# Patient Record
Sex: Male | Born: 1940
Health system: Southern US, Community
[De-identification: ages and names within clinical notes are randomized; demographics above are authoritative.]

## PROBLEM LIST (undated history)

## (undated) DIAGNOSIS — E7800 Pure hypercholesterolemia, unspecified: Secondary | ICD-10-CM

## (undated) DIAGNOSIS — Z8719 Personal history of other diseases of the digestive system: Secondary | ICD-10-CM

## (undated) DIAGNOSIS — M810 Age-related osteoporosis without current pathological fracture: Secondary | ICD-10-CM

## (undated) DIAGNOSIS — I6529 Occlusion and stenosis of unspecified carotid artery: Secondary | ICD-10-CM

## (undated) DIAGNOSIS — C3491 Malignant neoplasm of unspecified part of right bronchus or lung: Secondary | ICD-10-CM

## (undated) DIAGNOSIS — I701 Atherosclerosis of renal artery: Secondary | ICD-10-CM

## (undated) DIAGNOSIS — N189 Chronic kidney disease, unspecified: Secondary | ICD-10-CM

## (undated) DIAGNOSIS — I219 Acute myocardial infarction, unspecified: Secondary | ICD-10-CM

## (undated) DIAGNOSIS — I2510 Atherosclerotic heart disease of native coronary artery without angina pectoris: Secondary | ICD-10-CM

## (undated) DIAGNOSIS — I10 Essential (primary) hypertension: Secondary | ICD-10-CM

## (undated) DIAGNOSIS — Z87442 Personal history of urinary calculi: Secondary | ICD-10-CM

## (undated) DIAGNOSIS — R51 Headache: Secondary | ICD-10-CM

## (undated) DIAGNOSIS — R1319 Other dysphagia: Secondary | ICD-10-CM

## (undated) DIAGNOSIS — E860 Dehydration: Secondary | ICD-10-CM

## (undated) DIAGNOSIS — E039 Hypothyroidism, unspecified: Secondary | ICD-10-CM

## (undated) DIAGNOSIS — IMO0002 Reserved for concepts with insufficient information to code with codable children: Secondary | ICD-10-CM

## (undated) DIAGNOSIS — K219 Gastro-esophageal reflux disease without esophagitis: Secondary | ICD-10-CM

## (undated) DIAGNOSIS — M259 Joint disorder, unspecified: Secondary | ICD-10-CM

## (undated) DIAGNOSIS — J449 Chronic obstructive pulmonary disease, unspecified: Secondary | ICD-10-CM

## (undated) DIAGNOSIS — I739 Peripheral vascular disease, unspecified: Secondary | ICD-10-CM

## (undated) DIAGNOSIS — M25469 Effusion, unspecified knee: Secondary | ICD-10-CM

## (undated) DIAGNOSIS — M1710 Unilateral primary osteoarthritis, unspecified knee: Secondary | ICD-10-CM

## (undated) DIAGNOSIS — I4891 Unspecified atrial fibrillation: Secondary | ICD-10-CM

## (undated) DIAGNOSIS — J4489 Other specified chronic obstructive pulmonary disease: Secondary | ICD-10-CM

## (undated) DIAGNOSIS — Z5111 Encounter for antineoplastic chemotherapy: Secondary | ICD-10-CM

## (undated) DIAGNOSIS — K5730 Diverticulosis of large intestine without perforation or abscess without bleeding: Secondary | ICD-10-CM

## (undated) HISTORY — DX: Atherosclerosis of renal artery: I70.1

## (undated) HISTORY — DX: Unspecified atrial fibrillation: I48.91

## (undated) HISTORY — PX: CARDIAC CATHETERIZATION: SHX172

## (undated) HISTORY — DX: Peripheral vascular disease, unspecified: I73.9

## (undated) HISTORY — DX: Unilateral primary osteoarthritis, unspecified knee: M17.10

## (undated) HISTORY — DX: Hypothyroidism, unspecified: E03.9

## (undated) HISTORY — DX: Pure hypercholesterolemia, unspecified: E78.00

## (undated) HISTORY — PX: KNEE SURGERY: SHX244

## (undated) HISTORY — PX: VASCULAR SURGERY: SHX849

## (undated) HISTORY — DX: Other specified chronic obstructive pulmonary disease: J44.89

## (undated) HISTORY — DX: Encounter for antineoplastic chemotherapy: Z51.11

## (undated) HISTORY — DX: Acute myocardial infarction, unspecified: I21.9

## (undated) HISTORY — DX: Gastro-esophageal reflux disease without esophagitis: K21.9

## (undated) HISTORY — DX: Occlusion and stenosis of unspecified carotid artery: I65.29

## (undated) HISTORY — DX: Age-related osteoporosis without current pathological fracture: M81.0

## (undated) HISTORY — PX: CORONARY ARTERY BYPASS GRAFT: SHX141

## (undated) HISTORY — DX: Other dysphagia: R13.19

## (undated) HISTORY — DX: Dehydration: E86.0

## (undated) HISTORY — PX: JOINT REPLACEMENT: SHX530

## (undated) HISTORY — DX: Chronic kidney disease, unspecified: N18.9

## (undated) HISTORY — PX: CORONARY STENT PLACEMENT: SHX1402

## (undated) HISTORY — DX: Chronic obstructive pulmonary disease, unspecified: J44.9

## (undated) HISTORY — PX: COLONOSCOPY W/ BIOPSIES AND POLYPECTOMY: SHX1376

## (undated) HISTORY — DX: Effusion, unspecified knee: M25.469

## (undated) HISTORY — DX: Atherosclerotic heart disease of native coronary artery without angina pectoris: I25.10

## (undated) HISTORY — DX: Personal history of urinary calculi: Z87.442

## (undated) HISTORY — DX: Reserved for concepts with insufficient information to code with codable children: IMO0002

## (undated) HISTORY — PX: OTHER SURGICAL HISTORY: SHX169

## (undated) HISTORY — DX: Essential (primary) hypertension: I10

## (undated) HISTORY — DX: Diverticulosis of large intestine without perforation or abscess without bleeding: K57.30

## (undated) HISTORY — PX: ABDOMINAL AORTIC ANEURYSM REPAIR: SUR1152

## (undated) HISTORY — DX: Malignant neoplasm of unspecified part of right bronchus or lung: C34.91

---

## 1971-06-21 HISTORY — PX: VASECTOMY: SHX75

## 1998-06-20 HISTORY — PX: CHOLECYSTECTOMY: SHX55

## 1999-09-22 ENCOUNTER — Ambulatory Visit (HOSPITAL_COMMUNITY): Admission: RE | Admit: 1999-09-22 | Discharge: 1999-09-22 | Payer: Self-pay | Admitting: Cardiovascular Disease

## 1999-09-28 ENCOUNTER — Ambulatory Visit (HOSPITAL_COMMUNITY): Admission: RE | Admit: 1999-09-28 | Discharge: 1999-09-28 | Payer: Self-pay | Admitting: Cardiovascular Disease

## 2001-02-04 ENCOUNTER — Emergency Department (HOSPITAL_COMMUNITY): Admission: EM | Admit: 2001-02-04 | Discharge: 2001-02-04 | Payer: Self-pay | Admitting: Emergency Medicine

## 2001-02-16 ENCOUNTER — Ambulatory Visit (HOSPITAL_COMMUNITY): Admission: RE | Admit: 2001-02-16 | Discharge: 2001-02-16 | Payer: Self-pay | Admitting: Internal Medicine

## 2001-02-16 ENCOUNTER — Encounter: Payer: Self-pay | Admitting: Internal Medicine

## 2001-03-02 ENCOUNTER — Inpatient Hospital Stay (HOSPITAL_COMMUNITY): Admission: EM | Admit: 2001-03-02 | Discharge: 2001-03-05 | Payer: Self-pay | Admitting: Emergency Medicine

## 2001-03-03 ENCOUNTER — Encounter: Payer: Self-pay | Admitting: Cardiovascular Disease

## 2001-04-17 ENCOUNTER — Encounter (HOSPITAL_BASED_OUTPATIENT_CLINIC_OR_DEPARTMENT_OTHER): Payer: Self-pay | Admitting: General Surgery

## 2001-04-20 ENCOUNTER — Encounter (HOSPITAL_BASED_OUTPATIENT_CLINIC_OR_DEPARTMENT_OTHER): Payer: Self-pay | Admitting: General Surgery

## 2001-04-20 ENCOUNTER — Ambulatory Visit (HOSPITAL_COMMUNITY): Admission: RE | Admit: 2001-04-20 | Discharge: 2001-04-21 | Payer: Self-pay | Admitting: General Surgery

## 2001-04-20 ENCOUNTER — Encounter (INDEPENDENT_AMBULATORY_CARE_PROVIDER_SITE_OTHER): Payer: Self-pay | Admitting: *Deleted

## 2001-06-20 ENCOUNTER — Emergency Department (HOSPITAL_COMMUNITY): Admission: EM | Admit: 2001-06-20 | Discharge: 2001-06-20 | Payer: Self-pay

## 2002-03-21 ENCOUNTER — Encounter: Payer: Self-pay | Admitting: Internal Medicine

## 2002-03-21 ENCOUNTER — Ambulatory Visit (HOSPITAL_COMMUNITY): Admission: RE | Admit: 2002-03-21 | Discharge: 2002-03-21 | Payer: Self-pay | Admitting: Internal Medicine

## 2002-03-22 ENCOUNTER — Encounter (HOSPITAL_COMMUNITY): Admission: RE | Admit: 2002-03-22 | Discharge: 2002-04-21 | Payer: Self-pay | Admitting: Cardiology

## 2002-06-20 HISTORY — PX: GALLBLADDER SURGERY: SHX652

## 2002-10-18 ENCOUNTER — Ambulatory Visit (HOSPITAL_COMMUNITY): Admission: RE | Admit: 2002-10-18 | Discharge: 2002-10-18 | Payer: Self-pay | Admitting: Orthopaedic Surgery

## 2002-12-10 ENCOUNTER — Encounter (HOSPITAL_COMMUNITY): Admission: RE | Admit: 2002-12-10 | Discharge: 2003-01-09 | Payer: Self-pay | Admitting: Orthopaedic Surgery

## 2003-06-30 ENCOUNTER — Encounter: Payer: Self-pay | Admitting: Orthopedic Surgery

## 2004-05-03 ENCOUNTER — Ambulatory Visit: Payer: Self-pay | Admitting: Cardiology

## 2004-05-03 ENCOUNTER — Ambulatory Visit: Payer: Self-pay

## 2004-05-07 ENCOUNTER — Ambulatory Visit: Payer: Self-pay | Admitting: Cardiovascular Disease

## 2004-05-24 ENCOUNTER — Encounter (INDEPENDENT_AMBULATORY_CARE_PROVIDER_SITE_OTHER): Payer: Self-pay | Admitting: Specialist

## 2004-05-24 ENCOUNTER — Inpatient Hospital Stay (HOSPITAL_COMMUNITY): Admission: RE | Admit: 2004-05-24 | Discharge: 2004-05-25 | Payer: Self-pay | Admitting: Vascular Surgery

## 2004-07-09 ENCOUNTER — Ambulatory Visit: Payer: Self-pay | Admitting: Cardiovascular Disease

## 2004-07-12 ENCOUNTER — Ambulatory Visit: Payer: Self-pay | Admitting: Internal Medicine

## 2004-07-13 ENCOUNTER — Encounter: Admission: RE | Admit: 2004-07-13 | Discharge: 2004-07-13 | Payer: Self-pay | Admitting: Cardiovascular Disease

## 2004-07-15 ENCOUNTER — Ambulatory Visit: Payer: Self-pay | Admitting: Internal Medicine

## 2004-07-22 ENCOUNTER — Ambulatory Visit: Payer: Self-pay

## 2004-08-17 ENCOUNTER — Ambulatory Visit: Admission: RE | Admit: 2004-08-17 | Discharge: 2004-08-17 | Payer: Self-pay | Admitting: Vascular Surgery

## 2004-08-26 ENCOUNTER — Ambulatory Visit (HOSPITAL_COMMUNITY): Admission: RE | Admit: 2004-08-26 | Discharge: 2004-08-26 | Payer: Self-pay | Admitting: Vascular Surgery

## 2004-11-08 ENCOUNTER — Ambulatory Visit: Payer: Self-pay | Admitting: Cardiology

## 2004-11-11 ENCOUNTER — Ambulatory Visit: Payer: Self-pay | Admitting: Cardiology

## 2004-11-11 ENCOUNTER — Ambulatory Visit: Payer: Self-pay | Admitting: Cardiovascular Disease

## 2005-01-13 ENCOUNTER — Encounter: Payer: Self-pay | Admitting: Orthopedic Surgery

## 2005-03-07 ENCOUNTER — Ambulatory Visit: Payer: Self-pay

## 2005-03-07 ENCOUNTER — Ambulatory Visit: Payer: Self-pay | Admitting: Cardiovascular Disease

## 2005-03-29 ENCOUNTER — Ambulatory Visit: Payer: Self-pay | Admitting: Cardiovascular Disease

## 2005-05-16 ENCOUNTER — Ambulatory Visit: Payer: Self-pay | Admitting: Orthopedic Surgery

## 2005-05-23 ENCOUNTER — Ambulatory Visit: Payer: Self-pay

## 2005-05-26 ENCOUNTER — Ambulatory Visit: Payer: Self-pay | Admitting: Internal Medicine

## 2005-09-28 ENCOUNTER — Ambulatory Visit: Payer: Self-pay | Admitting: Cardiovascular Disease

## 2005-10-10 ENCOUNTER — Ambulatory Visit: Payer: Self-pay

## 2005-11-15 ENCOUNTER — Ambulatory Visit: Payer: Self-pay | Admitting: Cardiovascular Disease

## 2005-11-17 ENCOUNTER — Ambulatory Visit: Payer: Self-pay | Admitting: Cardiology

## 2006-03-07 ENCOUNTER — Ambulatory Visit: Payer: Self-pay | Admitting: Cardiovascular Disease

## 2006-04-06 ENCOUNTER — Ambulatory Visit (HOSPITAL_COMMUNITY): Admission: RE | Admit: 2006-04-06 | Discharge: 2006-04-06 | Payer: Self-pay | Admitting: Vascular Surgery

## 2006-04-27 ENCOUNTER — Ambulatory Visit: Payer: Self-pay

## 2006-05-04 ENCOUNTER — Ambulatory Visit: Payer: Self-pay | Admitting: Cardiology

## 2006-06-21 LAB — HM COLONOSCOPY: HM Colonoscopy: NORMAL

## 2006-08-23 ENCOUNTER — Ambulatory Visit: Payer: Self-pay | Admitting: Cardiovascular Disease

## 2006-08-23 LAB — CONVERTED CEMR LAB
BUN: 19 mg/dL (ref 6–23)
Basophils Absolute: 0 10*3/uL (ref 0.0–0.1)
Basophils Relative: 0.6 % (ref 0.0–1.0)
CO2: 30 meq/L (ref 19–32)
Calcium: 9.2 mg/dL (ref 8.4–10.5)
Chloride: 102 meq/L (ref 96–112)
Creatinine, Ser: 0.8 mg/dL (ref 0.4–1.5)
Hemoglobin: 14.1 g/dL (ref 13.0–17.0)
INR: 1 (ref 0.9–2.0)
MCHC: 33.5 g/dL (ref 30.0–36.0)
Monocytes Relative: 6.4 % (ref 3.0–11.0)
Prothrombin Time: 12.1 s (ref 10.0–14.0)
RBC: 5 M/uL (ref 4.22–5.81)
RDW: 14.4 % (ref 11.5–14.6)
aPTT: 30.3 s (ref 26.5–36.5)

## 2006-08-28 ENCOUNTER — Ambulatory Visit (HOSPITAL_COMMUNITY): Admission: RE | Admit: 2006-08-28 | Discharge: 2006-08-28 | Payer: Self-pay | Admitting: Cardiovascular Disease

## 2006-08-28 ENCOUNTER — Ambulatory Visit: Payer: Self-pay | Admitting: Cardiovascular Disease

## 2006-10-13 ENCOUNTER — Ambulatory Visit: Payer: Self-pay | Admitting: Cardiovascular Disease

## 2006-10-13 LAB — CONVERTED CEMR LAB
Alkaline Phosphatase: 82 units/L (ref 39–117)
Bilirubin, Direct: 0.1 mg/dL (ref 0.0–0.3)
Cholesterol: 123 mg/dL (ref 0–200)
HDL: 41.8 mg/dL (ref >39.0)
LDL Cholesterol: 67 mg/dL (ref 0–99)
Total Bilirubin: 0.8 mg/dL (ref 0.3–1.2)
Total CHOL/HDL Ratio: 2.9
Total Protein: 5.9 g/dL — ABNORMAL LOW (ref 6.0–8.3)
Triglycerides: 70 mg/dL (ref 0–149)

## 2006-10-19 ENCOUNTER — Ambulatory Visit: Payer: Self-pay | Admitting: Internal Medicine

## 2007-01-01 ENCOUNTER — Ambulatory Visit: Payer: Self-pay | Admitting: Internal Medicine

## 2007-01-11 ENCOUNTER — Ambulatory Visit: Payer: Self-pay | Admitting: Internal Medicine

## 2007-01-11 ENCOUNTER — Encounter: Payer: Self-pay | Admitting: Internal Medicine

## 2007-02-13 ENCOUNTER — Ambulatory Visit: Payer: Self-pay | Admitting: Vascular Surgery

## 2007-04-02 ENCOUNTER — Ambulatory Visit: Payer: Self-pay | Admitting: Internal Medicine

## 2007-04-02 LAB — CONVERTED CEMR LAB
ALT: 22 units/L (ref 0–53)
AST: 23 units/L (ref 0–37)
Bilirubin, Direct: 0.1 mg/dL (ref 0.0–0.3)
Cholesterol: 135 mg/dL (ref 0–200)
HDL: 38.4 mg/dL — ABNORMAL LOW (ref >39.0)
Total Bilirubin: 0.8 mg/dL (ref 0.3–1.2)
Triglycerides: 142 mg/dL (ref 0–149)
VLDL: 28 mg/dL (ref 0–40)

## 2007-04-05 ENCOUNTER — Ambulatory Visit: Payer: Self-pay | Admitting: Cardiology

## 2007-07-09 ENCOUNTER — Ambulatory Visit: Payer: Self-pay

## 2007-07-09 LAB — CONVERTED CEMR LAB
ALT: 25 units/L (ref 0–53)
Albumin: 3.8 g/dL (ref 3.5–5.2)
Alkaline Phosphatase: 63 units/L (ref 39–117)
LDL Cholesterol: 53 mg/dL (ref 0–99)
Total Bilirubin: 0.8 mg/dL (ref 0.3–1.2)
Total CHOL/HDL Ratio: 3

## 2007-07-12 ENCOUNTER — Ambulatory Visit: Payer: Self-pay | Admitting: Cardiology

## 2007-07-20 ENCOUNTER — Ambulatory Visit: Payer: Self-pay | Admitting: Cardiology

## 2007-07-20 ENCOUNTER — Observation Stay (HOSPITAL_COMMUNITY): Admission: EM | Admit: 2007-07-20 | Discharge: 2007-07-21 | Payer: Self-pay | Admitting: Emergency Medicine

## 2007-08-03 ENCOUNTER — Encounter (HOSPITAL_COMMUNITY): Admission: RE | Admit: 2007-08-03 | Discharge: 2007-09-02 | Payer: Self-pay | Admitting: Cardiology

## 2007-08-03 ENCOUNTER — Ambulatory Visit: Payer: Self-pay | Admitting: Cardiology

## 2007-08-06 ENCOUNTER — Ambulatory Visit: Payer: Self-pay | Admitting: Cardiovascular Disease

## 2007-10-10 DIAGNOSIS — I2510 Atherosclerotic heart disease of native coronary artery without angina pectoris: Secondary | ICD-10-CM

## 2007-10-10 DIAGNOSIS — I10 Essential (primary) hypertension: Secondary | ICD-10-CM | POA: Insufficient documentation

## 2007-10-10 DIAGNOSIS — J441 Chronic obstructive pulmonary disease with (acute) exacerbation: Secondary | ICD-10-CM

## 2007-10-10 DIAGNOSIS — K219 Gastro-esophageal reflux disease without esophagitis: Secondary | ICD-10-CM

## 2007-10-10 DIAGNOSIS — Z87442 Personal history of urinary calculi: Secondary | ICD-10-CM

## 2007-10-10 DIAGNOSIS — Z9889 Other specified postprocedural states: Secondary | ICD-10-CM

## 2007-10-10 DIAGNOSIS — Z9089 Acquired absence of other organs: Secondary | ICD-10-CM | POA: Insufficient documentation

## 2007-10-10 DIAGNOSIS — Z951 Presence of aortocoronary bypass graft: Secondary | ICD-10-CM

## 2007-10-10 DIAGNOSIS — K5730 Diverticulosis of large intestine without perforation or abscess without bleeding: Secondary | ICD-10-CM | POA: Insufficient documentation

## 2007-10-10 DIAGNOSIS — E039 Hypothyroidism, unspecified: Secondary | ICD-10-CM

## 2007-10-10 DIAGNOSIS — I739 Peripheral vascular disease, unspecified: Secondary | ICD-10-CM | POA: Insufficient documentation

## 2007-10-10 DIAGNOSIS — E7800 Pure hypercholesterolemia, unspecified: Secondary | ICD-10-CM

## 2007-10-10 DIAGNOSIS — M810 Age-related osteoporosis without current pathological fracture: Secondary | ICD-10-CM | POA: Insufficient documentation

## 2007-10-26 ENCOUNTER — Ambulatory Visit: Payer: Self-pay | Admitting: Cardiovascular Disease

## 2007-12-11 ENCOUNTER — Ambulatory Visit: Payer: Self-pay | Admitting: Orthopedic Surgery

## 2007-12-11 DIAGNOSIS — M1710 Unilateral primary osteoarthritis, unspecified knee: Secondary | ICD-10-CM

## 2007-12-31 ENCOUNTER — Ambulatory Visit: Payer: Self-pay | Admitting: Cardiovascular Disease

## 2007-12-31 LAB — CONVERTED CEMR LAB
Albumin: 3.7 g/dL (ref 3.5–5.2)
HDL: 40.6 mg/dL (ref >39.0)
LDL Cholesterol: 69 mg/dL (ref 0–99)
Total Bilirubin: 0.8 mg/dL (ref 0.3–1.2)
Total CHOL/HDL Ratio: 3.2
Triglycerides: 96 mg/dL (ref 0–149)

## 2008-01-03 ENCOUNTER — Ambulatory Visit: Payer: Self-pay | Admitting: Cardiovascular Disease

## 2008-01-14 ENCOUNTER — Ambulatory Visit: Payer: Self-pay | Admitting: Orthopedic Surgery

## 2008-01-14 DIAGNOSIS — M25569 Pain in unspecified knee: Secondary | ICD-10-CM

## 2008-01-14 DIAGNOSIS — M25469 Effusion, unspecified knee: Secondary | ICD-10-CM | POA: Insufficient documentation

## 2008-01-28 ENCOUNTER — Ambulatory Visit: Payer: Self-pay | Admitting: Orthopedic Surgery

## 2008-01-29 ENCOUNTER — Ambulatory Visit: Payer: Self-pay | Admitting: Vascular Surgery

## 2008-03-16 ENCOUNTER — Ambulatory Visit: Payer: Self-pay | Admitting: Orthopedic Surgery

## 2008-04-02 ENCOUNTER — Ambulatory Visit: Payer: Self-pay | Admitting: Orthopedic Surgery

## 2008-04-30 ENCOUNTER — Ambulatory Visit: Payer: Self-pay | Admitting: Cardiovascular Disease

## 2008-07-22 ENCOUNTER — Ambulatory Visit: Payer: Self-pay | Admitting: Cardiology

## 2008-07-22 LAB — CONVERTED CEMR LAB
ALT: 25 units/L (ref 0–53)
Albumin: 3.8 g/dL (ref 3.5–5.2)
Cholesterol: 119 mg/dL (ref 0–200)
HDL: 36.6 mg/dL — ABNORMAL LOW (ref >39.0)
Total Protein: 6.3 g/dL (ref 6.0–8.3)
VLDL: 20 mg/dL (ref 0–40)

## 2008-07-28 ENCOUNTER — Ambulatory Visit: Payer: Self-pay | Admitting: Cardiology

## 2008-09-25 ENCOUNTER — Ambulatory Visit: Payer: Self-pay | Admitting: Orthopedic Surgery

## 2008-10-09 ENCOUNTER — Encounter: Payer: Self-pay | Admitting: Cardiovascular Disease

## 2008-10-22 ENCOUNTER — Ambulatory Visit: Payer: Self-pay

## 2008-10-22 ENCOUNTER — Encounter: Payer: Self-pay | Admitting: Cardiovascular Disease

## 2008-10-22 ENCOUNTER — Encounter (INDEPENDENT_AMBULATORY_CARE_PROVIDER_SITE_OTHER): Payer: Self-pay | Admitting: *Deleted

## 2008-10-22 LAB — CONVERTED CEMR LAB
Bilirubin, Direct: 0.3 mg/dL (ref 0.0–0.3)
HDL: 36.7 mg/dL — ABNORMAL LOW (ref >39.00)
LDL Cholesterol: 50 mg/dL (ref 0–99)
Total Bilirubin: 0.9 mg/dL (ref 0.3–1.2)
Total CHOL/HDL Ratio: 3
Triglycerides: 129 mg/dL (ref 0.0–149.0)
VLDL: 25.8 mg/dL (ref 0.0–40.0)

## 2008-10-27 ENCOUNTER — Ambulatory Visit: Payer: Self-pay | Admitting: Cardiovascular Disease

## 2008-11-03 ENCOUNTER — Ambulatory Visit: Payer: Self-pay | Admitting: Internal Medicine

## 2008-11-03 LAB — CONVERTED CEMR LAB
CO2: 29 meq/L (ref 19–32)
Calcium: 9.6 mg/dL (ref 8.4–10.5)
Creatinine, Ser: 0.9 mg/dL (ref 0.4–1.5)
TSH: 0.94 microintl units/mL (ref 0.35–5.50)

## 2008-11-10 ENCOUNTER — Ambulatory Visit: Payer: Self-pay | Admitting: Cardiovascular Disease

## 2008-12-01 ENCOUNTER — Encounter: Payer: Self-pay | Admitting: Cardiovascular Disease

## 2008-12-10 ENCOUNTER — Ambulatory Visit: Payer: Self-pay | Admitting: Cardiovascular Disease

## 2008-12-10 ENCOUNTER — Ambulatory Visit: Payer: Self-pay

## 2008-12-10 DIAGNOSIS — I4891 Unspecified atrial fibrillation: Secondary | ICD-10-CM

## 2009-04-15 ENCOUNTER — Ambulatory Visit: Payer: Self-pay | Admitting: Orthopedic Surgery

## 2009-04-22 ENCOUNTER — Ambulatory Visit: Payer: Self-pay | Admitting: Cardiovascular Disease

## 2009-04-30 ENCOUNTER — Ambulatory Visit: Payer: Self-pay | Admitting: Cardiovascular Disease

## 2009-04-30 LAB — CONVERTED CEMR LAB: HDL goal, serum: 40 mg/dL

## 2009-05-04 ENCOUNTER — Encounter (INDEPENDENT_AMBULATORY_CARE_PROVIDER_SITE_OTHER): Payer: Self-pay | Admitting: *Deleted

## 2009-05-04 LAB — CONVERTED CEMR LAB
Bilirubin, Direct: 0.1 mg/dL (ref 0.0–0.3)
LDL Cholesterol: 60 mg/dL (ref 0–99)
Total Bilirubin: 1.1 mg/dL (ref 0.3–1.2)
Total CHOL/HDL Ratio: 3
VLDL: 21.2 mg/dL (ref 0.0–40.0)

## 2009-09-23 ENCOUNTER — Ambulatory Visit: Payer: Self-pay | Admitting: Cardiology

## 2009-09-23 ENCOUNTER — Ambulatory Visit: Payer: Self-pay | Admitting: Cardiovascular Disease

## 2009-09-23 ENCOUNTER — Encounter: Payer: Self-pay | Admitting: Cardiovascular Disease

## 2009-09-23 ENCOUNTER — Encounter (INDEPENDENT_AMBULATORY_CARE_PROVIDER_SITE_OTHER): Payer: Self-pay | Admitting: *Deleted

## 2009-09-23 ENCOUNTER — Ambulatory Visit: Payer: Self-pay

## 2009-09-23 ENCOUNTER — Ambulatory Visit (HOSPITAL_COMMUNITY): Admission: RE | Admit: 2009-09-23 | Discharge: 2009-09-23 | Payer: Self-pay | Admitting: Cardiovascular Disease

## 2009-10-19 ENCOUNTER — Ambulatory Visit: Payer: Self-pay | Admitting: Cardiology

## 2009-10-22 ENCOUNTER — Ambulatory Visit: Payer: Self-pay | Admitting: Internal Medicine

## 2009-10-27 ENCOUNTER — Encounter (INDEPENDENT_AMBULATORY_CARE_PROVIDER_SITE_OTHER): Payer: Self-pay | Admitting: *Deleted

## 2009-10-27 LAB — CONVERTED CEMR LAB
HDL: 35.8 mg/dL — ABNORMAL LOW (ref >39.00)
LDL Cholesterol: 40 mg/dL (ref 0–99)
Total CHOL/HDL Ratio: 3
Total Protein: 6.1 g/dL (ref 6.0–8.3)
Triglycerides: 100 mg/dL (ref 0.0–149.0)
VLDL: 20 mg/dL (ref 0.0–40.0)

## 2009-10-29 ENCOUNTER — Telehealth: Payer: Self-pay | Admitting: Cardiovascular Disease

## 2010-04-19 ENCOUNTER — Ambulatory Visit: Payer: Self-pay | Admitting: Cardiovascular Disease

## 2010-04-20 ENCOUNTER — Encounter (INDEPENDENT_AMBULATORY_CARE_PROVIDER_SITE_OTHER): Payer: Self-pay | Admitting: *Deleted

## 2010-04-20 LAB — CONVERTED CEMR LAB
ALT: 29 units/L (ref 0–53)
Albumin: 3.8 g/dL (ref 3.5–5.2)
Bilirubin, Direct: 0.1 mg/dL (ref 0.0–0.3)
Cholesterol: 111 mg/dL (ref 0–200)
Total CHOL/HDL Ratio: 3
Total Protein: 6.2 g/dL (ref 6.0–8.3)
Triglycerides: 94 mg/dL (ref 0.0–149.0)

## 2010-04-22 ENCOUNTER — Ambulatory Visit: Payer: Self-pay | Admitting: Cardiology

## 2010-04-27 ENCOUNTER — Ambulatory Visit: Payer: Self-pay | Admitting: Cardiovascular Disease

## 2010-04-27 ENCOUNTER — Encounter: Payer: Self-pay | Admitting: Internal Medicine

## 2010-04-27 ENCOUNTER — Encounter: Payer: Self-pay | Admitting: Cardiovascular Disease

## 2010-04-27 DIAGNOSIS — R1319 Other dysphagia: Secondary | ICD-10-CM

## 2010-06-03 ENCOUNTER — Ambulatory Visit: Payer: Self-pay | Admitting: Cardiovascular Disease

## 2010-06-03 ENCOUNTER — Ambulatory Visit: Payer: Self-pay

## 2010-06-03 ENCOUNTER — Encounter: Payer: Self-pay | Admitting: Cardiovascular Disease

## 2010-07-18 LAB — CONVERTED CEMR LAB
Eosinophils Relative: 1.5 % (ref 0.0–5.0)
INR: 0.9 (ref 0.8–1.0)
MCV: 88.1 fL (ref 78.0–100.0)
Monocytes Absolute: 0.5 10*3/uL (ref 0.1–1.0)
Neutrophils Relative %: 68.6 % (ref 43.0–77.0)
Platelets: 176 10*3/uL (ref 150.0–400.0)
Prothrombin Time: 10.1 s — ABNORMAL LOW (ref 10.9–13.3)
WBC: 6.7 10*3/uL (ref 4.5–10.5)

## 2010-07-22 NOTE — Letter (Signed)
Summary: Custom - Lipid  [facility], Main Office  [address]   [place], [place] [address]   Phone: [phone]  Fax: [phone]     April 20, 2010 MRN: [id number]   [name] [name] [address] [place], [place]  [address]   Dear [name],  We have reviewed your cholesterol results.  They are as follows:     Total Cholesterol:    111 (Desirable: less than 200)       HDL  Cholesterol:     35.50  (Desirable: greater than 40 for men and 50 for women)       LDL Cholesterol:       57  (Desirable: less than 100 for low risk and less than 70 for moderate to high risk)       Triglycerides:       94.0  (Desirable: less than 150)  Our recommendations include:These numbers look good. Continue on the same medicine. Liver function is normal. Take care, Dr. Leanora Cover.   Call our office at the number listed above if you have any questions.  Lowering your LDL cholesterol is important, but it is only one of a large number of "risk factors" that may indicate that you are at risk for heart disease, stroke or other complications of hardening of the arteries.  Other risk factors include:   A.  Cigarette Smoking* B.  High Blood Pressure* C.  Obesity* D.   Low HDL Cholesterol (see yours above)* E.   Diabetes Mellitus (higher risk if your is uncontrolled) F.  Family history of premature heart disease G.  Previous history of stroke or cardiovascular disease    *These are risk factors YOU HAVE CONTROL OVER.  For more information, visit .  There is now evidence that lowering the TOTAL CHOLESTEROL AND LDL CHOLESTEROL can reduce the risk of heart disease.  The American Heart Association recommends the following guidelines for the treatment of elevated cholesterol:  1.  If there is now current heart disease and less than two risk factors, TOTAL CHOLESTEROL should be less than 200 and LDL CHOLESTEROL should be less than 100. 2.  If there is current heart  disease or two or more risk factors, TOTAL CHOLESTEROL should be less than 200 and LDL CHOLESTEROL should be less than 70.  A diet low in cholesterol, saturated fat, and calories is the cornerstone of treatment for elevated cholesterol.  Cessation of smoking and exercise are also important in the management of elevated cholesterol and preventing vascular disease.  Studies have shown that 30 to 60 minutes of physical activity most days can help lower blood pressure, lower cholesterol, and keep your weight at a healthy level.  Drug therapy is used when cholesterol levels do not respond to therapeutic lifestyle changes (smoking cessation, diet, and exercise) and remains unacceptably high.  If medication is started, it is important to have you levels checked periodically to evaluate the need for further treatment options.  Thank you,    [employer] Team

## 2010-07-22 NOTE — Letter (Signed)
Summary: New Patient letter  [facility]  [address] [place], [place] [address]   Phone: [phone]  Fax: [phone]       04/27/2010 MRN: [id number]  [facility] [address] [place], [place]  [address]  Dear Mr. MCENERY,  Welcome to the Gastroenterology Division at [facility].    You are scheduled to see Dr.  Marina Goodell on 06-15-10 at 1:45PM on the 3rd floor at [facility], [address]  We ask that you try to arrive at our office 15 minutes prior to your appointment time to allow for check-in.  We would like you to complete the enclosed self-administered evaluation form prior to your visit and bring it with you on the day of your appointment.  We will review it with you.  Also, please bring a complete list of all your medications or, if you prefer, bring the medication bottles and we will list them.  Please bring your insurance card so that we may make a copy of it.  If your insurance requires a referral to see a specialist, please bring your referral form from your primary care physician.  Co-payments are due at the time of your visit and may be paid by cash, check or credit card.     Your office visit will consist of a consult with your physician (includes a physical exam), any laboratory testing he/she may order, scheduling of any necessary diagnostic testing (e.g. x-ray, ultrasound, CT-scan), and scheduling of a procedure (e.g. Endoscopy, Colonoscopy) if required.  Please allow enough time on your schedule to allow for any/all of these possibilities.    If you cannot keep your appointment, please call [phone] to cancel or reschedule prior to your appointment date.  This allows [place] the opportunity to schedule an appointment for another patient in need of care.  If you do not cancel or reschedule by 5 p.m. the business day prior to your appointment date, you will be charged a $50.00 late cancellation/no-show fee.    Thank you for  choosing [facility] for your medical needs.  We appreciate the opportunity to care for you.  Please visit [place] at our website  to learn more about our practice.                     Sincerely,                                                             The Gastroenterology Division

## 2010-07-22 NOTE — Assessment & Plan Note (Signed)
Summary: rov/sp   Visit Type:  Follow-up   History of Present Illness: [name] has been doing well.  He has been compliant and tolerating his cholesterol-lowering meds: Crestor 20mg , No Flush Niacin 2g, fish oil 2g, flaxseed oil 2g, coenzyme Q10 100mg .  He denies any muscle aches or pains.  He tries to observe a heart healthy diet and walks 1-2 miles most mornings.   Lipid Management Provider  Charolotte Eke, PharmD  Current Medications (verified): 1)  Triamterene-Hctz 37.5-25 Mg Caps (Triamterene-Hctz) .[name].. 1 Tab By Mouth Once Daily 2)  Metoprolol Tartrate 50 Mg Tabs (Metoprolol Tartrate) .[name].. 1 1/2 Tab By Mouth Two Times A Day 3)  Levothroid 125 Mcg Tabs (Levothyroxine Sodium) .... One Tablet Daily 4)  Plavix 75 Mg Tabs (Clopidogrel Bisulfate) .... Take One Tablet By Mouth Daily 5)  Prevacid 15 Mg Cpdr (Lansoprazole) .[name].. 1 Tab By Mouth Once Daily 6)  Crestor 20 Mg Tabs (Rosuvastatin Calcium) .... Take One Tablet By Mouth Daily. 7)  Co-Enzyme Q-10 100 Mg Caps (Coenzyme Q10) .... One Tablet Daily 8)  Aspirin Ec 325 Mg Tbec (Aspirin) .... Take One Tablet By Mouth Daily 9)  Eql Fish Oil 1000 Mg Caps (Omega-3 Fatty Acids) .... 2 Tablets Daily 10)  No Flush Niacin 500 Mg Tabs (Inositol Niacinate) .... 4 Tabs By Mouth At Bedtime 11)  Gnp Flax Seed Oil 1000 Mg Caps (Flaxseed (Linseed)) .... 2 Tablets Daily  Allergies (verified): No Known Drug Allergies   Vital Signs:  Patient profile:   70 year old male Weight:      167 pounds Pulse rate:   56 / minute BP sitting:   160 / 75  (right arm)  Impression & Recommendations:  Problem # 1:  HYPERCHOLESTEROLEMIA (ICD-272.0) His TC, TG, and LDL are at goals and even better than 6 months ago.  His HDL improved from 33.2 to 16.1 but is not quite at the goal of >40.  I congratulated him and asked him to return for follow-up in six months.  His updated medication list for this problem includes:    Crestor 20 Mg Tabs (Rosuvastatin calcium)  .[name]... Take one tablet by mouth daily.    Niacin    Fish oil    Flaxseed oil    Coenzyme Q10

## 2010-07-22 NOTE — Assessment & Plan Note (Signed)
Summary: f35m/jss recall  Medications Added NO FLUSH NIACIN 500 MG TABS (INOSITOL NIACINATE) 4 tabs by mouth at bedtime      Allergies Added: NKDA  CC:  no complaints.  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  he has seen Dr. Hart Rochester for surveillance of his AAA.    He prefers to have his carotids and subclavian followed up here.  His last carotid duplex done here in 09/2008  showed 40-59% left ICA stenosis.  With a patent left subclavian stent.  He has not had any significant TIAs her left upper extremity arm weakness.  Had a previous CABG.  His last catheter was done 310 2008.  His LIMA to the LAD was patent with a patent vein graft to the PDA and no significant  disease in his circumflex.  His LV function was normal. He denies SSCP and has chronic mild exertional dyspnea from lung disease.  I reviewed his echo from today and he has normal LV funciton with no significant valvualr heart disease.    Current Problems (verified): 1)  Atrial Fibrillation  (ICD-427.31) 2)  Encounter For Long-term Use of Other Medications  (ICD-V58.69) 3)  Joint Effusion, Knee  (ICD-719.06) 4)  Knee Pain  (ICD-719.46) 5)  Knee, Arthritis, Degen.Lanetta Inch  (ICD-715.96) 6)  Carotid Endarterectomy, Right, Hx of  (ICD-V15.1) 7)  Abdominal Aortic Aneurysm Repair, Hx of  (ICD-V15.1) 8)  Cholecystectomy, Hx of  (ICD-V45.79) 9)  Coronary Artery Bypass Graft, Hx of  (ICD-V45.81) 10)  Osteoporosis  (ICD-733.00) 11)  Nephrolithiasis, Hx of  (ICD-V13.01) 12)  Diverticulosis of Colon  (ICD-562.10) 13)  Chronic Obstructive Pulmonary Disease  (ICD-496) 14)  Hypertension  (ICD-401.9) 15)  Peripheral Vascular Disease  (ICD-443.9) 16)  Hypothyroidism  (ICD-244.9) 17)  Gerd  (ICD-530.81) 18)  Hypercholesterolemia  (ICD-272.0) 19)  Coronary Artery Disease  (ICD-414.00)  Current Medications (verified): 1)  Triamterene-Hctz 37.5-25 Mg Caps  (Triamterene-Hctz) .[name].. 1 Tab By Mouth Once Daily 2)  Metoprolol Tartrate 50 Mg Tabs (Metoprolol Tartrate) .[name].. 1 1/2 Tab By Mouth Two Times A Day 3)  Levothroid 125 Mcg Tabs (Levothyroxine Sodium) .... One Tablet Daily 4)  Plavix 75 Mg Tabs (Clopidogrel Bisulfate) .... Take One Tablet By Mouth Daily 5)  Prevacid 15 Mg Cpdr (Lansoprazole) .[name].. 1 Tab By Mouth Once Daily 6)  Crestor 20 Mg Tabs (Rosuvastatin Calcium) .... Take One Tablet By Mouth Daily. 7)  Co-Enzyme Q-10 100 Mg Caps (Coenzyme Q10) .... One Tablet Daily 8)  Aspirin Ec 325 Mg Tbec (Aspirin) .... Take One Tablet By Mouth Daily 9)  Eql Fish Oil 1000 Mg Caps (Omega-3 Fatty Acids) .... 2 Tablets Daily 10)  No Flush Niacin 500 Mg Tabs (Inositol Niacinate) .... 4 Tabs By Mouth At Bedtime 11)  Gnp Flax Seed Oil 1000 Mg Caps (Flaxseed (Linseed)) .... 2 Tablets Daily  Allergies (verified): No Known Drug Allergies  Past History:  Past Medical History: Last updated: 04/28/2008 Left Subclavian stenosis PTA Hart Rochester 08/2006 Hypertension AAA repair Babtist 1994 CAD/CABG  1995  RIMA to LAD, "SVG to RCA native OM no significant diseas last cath 08/2006 Hyperlipidemia  Past Surgical History: Last updated: 04/28/2008 AAA: repair with reimplanttion of renals CABG:  1995 gallbladder partial knee replacement  Family History: Last updated: 04/28/2008 Both mother and father had premature CAD  Social History: Last updated: 04/28/2008 Married Retired Lives in [place] Non-smoker Non-drinker  Review of Systems  Denies fever, malais, weight loss, blurry vision, decreased visual acuity, cough, sputum, SOB, hemoptysis, pleuritic pain, palpitaitons, heartburn, abdominal pain, melena, lower extremity edema, claudication, or rash.   Vital Signs:  Patient profile:   70 year old male Height:      67 inches Weight:      171 pounds BMI:     26.88 Pulse rate:   62 / minute Resp:     14 per minute BP sitting:   158 / 80  (left  arm)  Vitals Entered By: Kem Parkinson (September 23, 2009 2:08 PM)  Physical Exam  General:  Affect appropriate Healthy:  appears stated age HEENT: normal Neck supple with no adenopathy JVP normal bilateral carotid and subclavian bruits no thyromegaly Lungs clear with no wheezing and good diaphragmatic motion Heart:  S1/S2 no murmur,rub, gallop or click PMI normal Abdomen: benighn, BS positve, no tenderness,S/P AAA repair with ventral hernia no bruit.  No HSM or HJR Distal pulses intact with no bruits No edema Neuro non-focal Skin warm and dry    Impression & Recommendations:  Problem # 1:  CAROTID ENDARTERECTOMY, RIGHT, HX OF (ICD-V15.1) Duplex today reported by tech as no change Will reviewe latter today when on workstation Continue antiplatelet Rx  Problem # 2:  ABDOMINAL AORTIC ANEURYSM REPAIR, HX OF (ICD-V15.1) No enlargement on exam.  Significant ventral hernia with no incarceration  Problem # 3:  CORONARY ARTERY BYPASS GRAFT, HX OF (ICD-V45.81) Stable no angina.  Consdier myovue in a year.    Problem # 4:  HYPERTENSION (ICD-401.9) Well controlled His updated medication list for this problem includes:    Triamterene-hctz 37.5-25 Mg Caps (Triamterene-hctz) .[name]... 1 tab by mouth once daily    Metoprolol Tartrate 50 Mg Tabs (Metoprolol tartrate) .[name]... 1 1/2 tab by mouth two times a day    Aspirin Ec 325 Mg Tbec (Aspirin) .[name]... Take one tablet by mouth daily  Problem # 5:  CHRONIC OBSTRUCTIVE PULMONARY DISEASE (ICD-496) Mild dyspnea stabe.  F/U primary  Patient Instructions: 1)  Your physician recommends that you schedule a follow-up appointment in: 6 MONTHS

## 2010-07-22 NOTE — Progress Notes (Signed)
Summary: follow up appt   Phone Note Call from Patient Call back at Home Phone [phone]   Caller: Patient Reason for Call: Talk to Nurse Summary of Call: request call back, confused on when he is due for a carotid follow up Initial call taken by: Migdalia Dk,  Oct 29, 2009 9:27 AM  Follow-up for Phone Call        I reviewed 09/23/09 carotid US report. Per the report that Dr. [name] signed, the pt is due for f/u in 1 year on his carotid arteries. I have made the pt aware of this.  Follow-up by: Sherri Rad, RN, BSN,  Oct 29, 2009 9:41 AM

## 2010-07-22 NOTE — Assessment & Plan Note (Signed)
Summary: rov-tp    [name] [name] returns to lipic clinic. He has no medication complaints. He is tolerating the crestor well especially since taking the CoQ10. States that he used to have muscle aches with Lipitor but Crestor has worked fine for him. He hasn't experienced any muscle aches or pain. He hasn't been exercising as much. His knee has been bothering him, but still tries to walk about 30 minutes a day, 7 days a week. He is not eating a lot of fatty foods. Breakfast consists mostly of bran flakes with skim milk and coffee, lunch usually a salad and fat free ranch dressing, and dinner either chicken or seafood with vegetables. About once a week for dinner he has to eat fast food because his grandkids play a lot of sports and he picks them up.   Lipid Management Brytani Voth  Lyna Poser, PharmD  Current Medications (verified): 1)  Triamterene-Hctz 37.5-25 Mg Caps (Triamterene-Hctz) .[name].. 1 Tab By Mouth Once Daily 2)  Metoprolol Tartrate 50 Mg Tabs (Metoprolol Tartrate) .[name].. 1 1/2 Tab By Mouth Two Times A Day 3)  Levothroid 125 Mcg Tabs (Levothyroxine Sodium) .... One Tablet Daily 4)  Plavix 75 Mg Tabs (Clopidogrel Bisulfate) .... Take One Tablet By Mouth Daily 5)  Prevacid 15 Mg Cpdr (Lansoprazole) .[name].. 1 Tab By Mouth Once Daily 6)  Crestor 20 Mg Tabs (Rosuvastatin Calcium) .... Take One Tablet By Mouth Daily. 7)  Co-Enzyme Q-10 100 Mg Caps (Coenzyme Q10) .... One Tablet Daily 8)  Aspirin Ec 325 Mg Tbec (Aspirin) .... Take One Tablet By Mouth Daily 9)  Eql Fish Oil 1000 Mg Caps (Omega-3 Fatty Acids) .... 2 Tablets Daily 10)  No Flush Niacin 500 Mg Tabs (Inositol Niacinate) .... 4 Tabs By Mouth At Bedtime 11)  Gnp Flax Seed Oil 1000 Mg Caps (Flaxseed (Linseed)) .... 2 Tablets Daily  Allergies: No Known Drug Allergies  Past History:  Past Medical History: Last updated: 04/28/2008 Left Subclavian stenosis PTA Hart Rochester 08/2006 Hypertension AAA repair Babtist 1994 CAD/CABG  1995  RIMA to  LAD, "SVG to RCA native OM no significant diseas last cath 08/2006 Hyperlipidemia  Past Surgical History: Last updated: 04/28/2008 AAA: repair with reimplanttion of renals CABG:  1995 gallbladder partial knee replacement  Family History: Last updated: 04/28/2008 Both mother and father had premature CAD  Social History: Last updated: 04/28/2008 Married Retired Lives in [place] Non-smoker Non-drinker   Vital Signs:  Patient profile:   70 year old male Pulse rate:   52 / minute BP sitting:   145 / 86  (left arm)  Impression & Recommendations:  Problem # 1:  HYPERCHOLESTEROLEMIA (ICD-272.0)  Pt's current lipid panel: TC 111, TG's 94, HDL 35.5 (goal >40), LDL 57 (goal <70), LFT's WNL. Current regimen Crestor 20 mg, Niacin 2g, Fish oil 2 g daily, CoQ10 daily, flaxseed oil 2 g daily. Will continue with current regimen. Patient has been consistently at goal. Will return to Dr. Fabio Bering care, unless any problems develop with therapy. Continue with heart healthy diet and exercise regimen.   His updated medication list for this problem includes:    Crestor 20 Mg Tabs (Rosuvastatin calcium) .[name]... Take one tablet by mouth daily.  Patient Instructions: 1)  Continue heart healthy diet and exercise 2)  Continue with current medications 3)  F/U cholesterol with Dr. [name]

## 2010-07-22 NOTE — Letter (Signed)
Summary: Custom - Lipid  [facility], Main Office  [address]   [place], [place] [address]   Phone: [phone]  Fax: [phone]     Oct 27, 2009 MRN: [id number]   [name] [name] [address] [place], [place]  [address]   Dear Mr. BORGHI,  We have reviewed your cholesterol results.  They are as follows:     Total Cholesterol:    96 (Desirable: less than 200)       HDL  Cholesterol:     35.80  (Desirable: greater than 40 for men and 50 for women)       LDL Cholesterol:       40  (Desirable: less than 100 for low risk and less than 70 for moderate to high risk)       Triglycerides:       100.0  (Desirable: less than 150)  Our recommendations include:These numbers look good. Continue on the same medicine. Liver function is normal. Take care, Dr. Leanora Cover.    Call our office at the number listed above if you have any questions.  Lowering your LDL cholesterol is important, but it is only one of a large number of "risk factors" that may indicate that you are at risk for heart disease, stroke or other complications of hardening of the arteries.  Other risk factors include:   A.  Cigarette Smoking* B.  High Blood Pressure* C.  Obesity* D.   Low HDL Cholesterol (see yours above)* E.   Diabetes Mellitus (higher risk if your is uncontrolled) F.  Family history of premature heart disease G.  Previous history of stroke or cardiovascular disease    *These are risk factors YOU HAVE CONTROL OVER.  For more information, visit .  There is now evidence that lowering the TOTAL CHOLESTEROL AND LDL CHOLESTEROL can reduce the risk of heart disease.  The American Heart Association recommends the following guidelines for the treatment of elevated cholesterol:  1.  If there is now current heart disease and less than two risk factors, TOTAL CHOLESTEROL should be less than 200 and LDL CHOLESTEROL should be less than 100. 2.  If there is current heart disease  or two or more risk factors, TOTAL CHOLESTEROL should be less than 200 and LDL CHOLESTEROL should be less than 70.  A diet low in cholesterol, saturated fat, and calories is the cornerstone of treatment for elevated cholesterol.  Cessation of smoking and exercise are also important in the management of elevated cholesterol and preventing vascular disease.  Studies have shown that 30 to 60 minutes of physical activity most days can help lower blood pressure, lower cholesterol, and keep your weight at a healthy level.  Drug therapy is used when cholesterol levels do not respond to therapeutic lifestyle changes (smoking cessation, diet, and exercise) and remains unacceptably high.  If medication is started, it is important to have you levels checked periodically to evaluate the need for further treatment options.  Thank you,    [employer] Team

## 2010-07-22 NOTE — Assessment & Plan Note (Signed)
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  he has seen Dr. Hart Rochester for surveillance of his AAA.    He prefers to have his carotids and subclavian followed up here.  I reviewed his Abdominal duplex and his Aoritic and renal grafts are fine.  Reviewed his carotid which is also stable with wide open right CEA and 40-59% LICA needing F/U duplex in a year   He has not had any significant TIAs her left upper extremity arm weakness.  Had a previous CABG.  His last catheter was done 310 2008.  His LIMA to the LAD was patent with a patent vein graft to the PDA and no significant  disease in his circumflex.  His LV function was normal. He denies SSCP and has chronic mild exertional dyspnea from lung disease.    He is having more right knee and hip pain.  He has Dr Alusio's name for F/U.  He has a large ventral hernia and with his obesity he has some discomfort when bending over.    Problems Prior to Update: 1)  Other Dysphagia  (ICD-787.29) 2)  Pure Hypercholesterolemia  (ICD-272.0) 3)  Atrial Fibrillation  (ICD-427.31) 4)  Encounter For Long-term Use of Other Medications  (ICD-V58.69) 5)  Joint Effusion, Knee  (ICD-719.06) 6)  Knee Pain  (ICD-719.46) 7)  Knee, Arthritis, Degen.Lanetta Inch  (ICD-715.96) 8)  Carotid Endarterectomy, Right, Hx of  (ICD-V15.1) 9)  Abdominal Aortic Aneurysm Repair, Hx of  (ICD-V15.1) 10)  Cholecystectomy, Hx of  (ICD-V45.79) 11)  Coronary Artery Bypass Graft, Hx of  (ICD-V45.81) 12)  Osteoporosis  (ICD-733.00) 13)  Nephrolithiasis, Hx of  (ICD-V13.01) 14)  Diverticulosis of Colon  (ICD-562.10) 15)  Chronic Obstructive Pulmonary Disease  (ICD-496) 16)  Hypertension  (ICD-401.9) 17)  Peripheral Vascular Disease  (ICD-443.9) 18)  Hypothyroidism  (ICD-244.9) 19)  Gerd  (ICD-530.81) 20)   Hypercholesterolemia  (ICD-272.0) 21)  Coronary Artery Disease  (ICD-414.00)  Current Problems (verified): 1)  Other Dysphagia  (ZOX-096.04) 2)  Pure Hypercholesterolemia  (ICD-272.0) 3)  Atrial Fibrillation  (ICD-427.31) 4)  Encounter For Long-term Use of Other Medications  (ICD-V58.69) 5)  Joint Effusion, Knee  (ICD-719.06) 6)  Knee Pain  (ICD-719.46) 7)  Knee, Arthritis, Degen.Lanetta Inch  (ICD-715.96) 8)  Carotid Endarterectomy, Right, Hx of  (ICD-V15.1) 9)  Abdominal Aortic Aneurysm Repair, Hx of  (ICD-V15.1) 10)  Cholecystectomy, Hx of  (ICD-V45.79) 11)  Coronary Artery Bypass Graft, Hx of  (ICD-V45.81) 12)  Osteoporosis  (ICD-733.00) 13)  Nephrolithiasis, Hx of  (ICD-V13.01) 14)  Diverticulosis of Colon  (ICD-562.10) 15)  Chronic Obstructive Pulmonary Disease  (ICD-496) 16)  Hypertension  (ICD-401.9) 17)  Peripheral Vascular Disease  (ICD-443.9) 18)  Hypothyroidism  (ICD-244.9) 19)  Gerd  (ICD-530.81) 20)  Hypercholesterolemia  (ICD-272.0) 21)  Coronary Artery Disease  (ICD-414.00)  Current Medications (verified): 1)  Triamterene-Hctz 37.5-25 Mg Caps (Triamterene-Hctz) .[name].. 1 Tab By Mouth Once Daily 2)  Metoprolol Tartrate 50 Mg Tabs (Metoprolol Tartrate) .[name].. 1 1/2 Tab By Mouth Two Times A Day 3)  Levothroid 112 Mcg Tabs (Levothyroxine Sodium) .... Take 1 Tablet By Mouth Once A Day 4)  Plavix 75 Mg Tabs (Clopidogrel Bisulfate) .... Take One Tablet By Mouth Daily 5)  Prevacid 15 Mg Cpdr (Lansoprazole) .[name].. 1 Tab By Mouth Once Daily 6)  Crestor 20  Mg Tabs (Rosuvastatin Calcium) .... Take One Tablet By Mouth Daily. 7)  Co-Enzyme Q-10 100 Mg Caps (Coenzyme Q10) .... 2 Daily 8)  Aspirin Ec 325 Mg Tbec (Aspirin) .... Take One Tablet By Mouth Daily 9)  Eql Fish Oil 1000 Mg Caps (Omega-3 Fatty Acids) .... 2 Tablets Daily 10)  No Flush Niacin 500 Mg Tabs (Inositol Niacinate) .... 4 Tabs By Mouth At Bedtime 11)  Gnp Flax Seed Oil 1000 Mg Caps (Flaxseed (Linseed)) .... 2 Tablets  Daily  Allergies (verified): No Known Drug Allergies  Past History:  Past Medical History: Last updated: 04/28/2008 Left Subclavian stenosis PTA Hart Rochester 08/2006 Hypertension AAA repair Babtist 1994 CAD/CABG  1995  RIMA to LAD, "SVG to RCA native OM no significant diseas last cath 08/2006 Hyperlipidemia  Past Surgical History: Last updated: 04/28/2008 AAA: repair with reimplanttion of renals CABG:  1995 gallbladder partial knee replacement  Family History: Last updated: 04/28/2008 Both mother and father had premature CAD  Social History: Last updated: 04/28/2008 Married Retired Lives in [place] Non-smoker Non-drinker  Review of Systems       Denies fever, malais, weight loss, blurry vision, decreased visual acuity, cough, sputum,  hemoptysis, pleuritic pain, palpitaitons, heartburn, abdominal pain, melena, lower extremity edema, claudication, or rash.   Vital Signs:  Patient profile:   70 year old male Height:      67 inches Weight:      173.75 pounds BMI:     27.31 Pulse rate:   52 / minute Pulse rhythm:   regular Resp:     18 per minute BP sitting:   136 / 81  (right arm) Cuff size:   large  Vitals Entered By: Vikki Ports (June 03, 2010 11:01 AM)  Physical Exam  General:  Affect appropriate Healthy:  appears stated age HEENT: normal Neck supple with no adenopathy JVP normal bilateral subclavian and carotid bruits no thyromegaly Lungs clear with no wheezing and good diaphragmatic motion Heart:  S1/S2 SEM murmur,rub, gallop or click PMI normal Abdomen: benighn, BS positve, no tenderness, no AAA Large ventral hernia no bruit.  No HSM or HJR Distal pulses intact with bilateral femoral bruits No edema Neuro non-focal Skin warm and dry    Impression & Recommendations:  Problem # 1:  PURE HYPERCHOLESTEROLEMIA (ICD-272.0) At goal continue statin His updated medication list for this problem includes:    Crestor 20 Mg Tabs (Rosuvastatin  calcium) .[name]... Take one tablet by mouth daily.  CHOL: 111 (04/19/2010)   LDL: 57 (04/19/2010)   HDL: 35.50 (04/19/2010)   TG: 94.0 (04/19/2010) CHOL (goal): 200 (04/30/2009)   LDL (goal): 70 (04/30/2009)   HDL (goal): 40 (04/30/2009)   TG (goal): 150 (04/30/2009)  Problem # 2:  KNEE PAIN (ZOX-096.04) F/U Dr [name].  Consider Rx with Tramadol  Problem # 3:  CAROTID ENDARTERECTOMY, RIGHT, HX OF (ICD-V15.1) Stable including stent to left sublavian.  f/U duplex in 1 year.  40-59% LICA stenosis  Problem # 4:  ABDOMINAL AORTIC ANEURYSM REPAIR, HX OF (ICD-V15.1) [place] reviewed and graft to aort and kidney look fine.  Cr ok  F.U duplex in a year  Problem # 5:  CORONARY ARTERY BYPASS GRAFT, HX OF (ICD-V45.81) Stable no angina.  Continue ASA  Problem # 6:  HYPERTENSION (ICD-401.9) Well controlled  No RAS  graft ok His updated medication list for this problem includes:    Triamterene-hctz 37.5-25 Mg Caps (Triamterene-hctz) .[name]... 1 tab by mouth once daily    Metoprolol Tartrate 50 Mg Tabs (  Metoprolol tartrate) .[name]... 1 1/2 tab by mouth two times a day    Aspirin Ec 325 Mg Tbec (Aspirin) .[name]... Take one tablet by mouth daily  Patient Instructions: 1)  Your physician wants you to follow-up in:6 MONTHS   You will receive a reminder letter in the mail two months in advance. If you don't receive a letter, please call our office to schedule the follow-up appointment. 2)  Your physician has requested that you have an echocardiogram.  Echocardiography is a painless test that uses sound waves to create images of your heart. It provides your doctor with information about the size and shape of your heart and how well your heart's chambers and valves are working.  This procedure takes approximately one hour. There are no restrictions for this procedure.SCHEDULE IN 6 MONTHS

## 2010-07-22 NOTE — Assessment & Plan Note (Signed)
Summary: F6M/DM  Medications Added CO-ENZYME Q-10 100 MG CAPS (COENZYME Q10) 2 daily      Allergies Added: NKDA  Visit Type:  6 months follow up Primary Provider:  Claudia Pollock  CC:  No cardiac complaints.  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  he has seen Dr. Hart Rochester for surveillance of his AAA.    He prefers to have his carotids and subclavian followed up here.  His last carotid duplex done here in 09/2008  showed 40-59% left ICA stenosis.  With a patent left subclavian stent.  He has not had any significant TIAs her left upper extremity arm weakness.  Had a previous CABG.  His last catheter was done 310 2008.  His LIMA to the LAD was patent with a patent vein graft to the PDA and no significant  disease in his circumflex.  His LV function was normal. He denies SSCP and has chronic mild exertional dyspnea from lung disease.  He has been having some dysphagia.  He has had previous esophageal diseae with dilattion by Dr Marina Goodell.  Denies left arm pain angina or abdominal pain.  BP high today.  He indicates when wife takes it at home it is usually 130/80 range.  Need to monitor more closely and consider adding ACE   Current Problems (verified): 1)  Other Dysphagia  (ICD-787.29) 2)  Pure Hypercholesterolemia  (ICD-272.0) 3)  Atrial Fibrillation  (ICD-427.31) 4)  Encounter For Long-term Use of Other Medications  (ICD-V58.69) 5)  Joint Effusion, Knee  (ICD-719.06) 6)  Knee Pain  (ICD-719.46) 7)  Knee, Arthritis, Degen.Lanetta Inch  (ICD-715.96) 8)  Carotid Endarterectomy, Right, Hx of  (ICD-V15.1) 9)  Abdominal Aortic Aneurysm Repair, Hx of  (ICD-V15.1) 10)  Cholecystectomy, Hx of  (ICD-V45.79) 11)  Coronary Artery Bypass Graft, Hx of  (ICD-V45.81) 12)  Osteoporosis  (ICD-733.00) 13)  Nephrolithiasis, Hx of  (ICD-V13.01) 14)  Diverticulosis of Colon  (ICD-562.10) 15)  Chronic Obstructive Pulmonary Disease   (ICD-496) 16)  Hypertension  (ICD-401.9) 17)  Peripheral Vascular Disease  (ICD-443.9) 18)  Hypothyroidism  (ICD-244.9) 19)  Gerd  (ICD-530.81) 20)  Hypercholesterolemia  (ICD-272.0) 21)  Coronary Artery Disease  (ICD-414.00)  Current Medications (verified): 1)  Triamterene-Hctz 37.5-25 Mg Caps (Triamterene-Hctz) .[name].. 1 Tab By Mouth Once Daily 2)  Metoprolol Tartrate 50 Mg Tabs (Metoprolol Tartrate) .[name].. 1 1/2 Tab By Mouth Two Times A Day 3)  Levothroid 125 Mcg Tabs (Levothyroxine Sodium) .... One Tablet Daily 4)  Plavix 75 Mg Tabs (Clopidogrel Bisulfate) .... Take One Tablet By Mouth Daily 5)  Prevacid 15 Mg Cpdr (Lansoprazole) .[name].. 1 Tab By Mouth Once Daily 6)  Crestor 20 Mg Tabs (Rosuvastatin Calcium) .... Take One Tablet By Mouth Daily. 7)  Co-Enzyme Q-10 100 Mg Caps (Coenzyme Q10) .... 2 Daily 8)  Aspirin Ec 325 Mg Tbec (Aspirin) .... Take One Tablet By Mouth Daily 9)  Eql Fish Oil 1000 Mg Caps (Omega-3 Fatty Acids) .... 2 Tablets Daily 10)  No Flush Niacin 500 Mg Tabs (Inositol Niacinate) .... 4 Tabs By Mouth At Bedtime 11)  Gnp Flax Seed Oil 1000 Mg Caps (Flaxseed (Linseed)) .... 2 Tablets Daily  Allergies (verified): No Known Drug Allergies  Past History:  Past Medical History: Last updated: 04/28/2008 Left Subclavian stenosis PTA Hart Rochester 08/2006 Hypertension AAA repair Babtist 1994 CAD/CABG  1995  RIMA to LAD, "SVG to RCA native OM no significant diseas last cath 08/2006 Hyperlipidemia  Past Surgical History: Last updated: 04/28/2008 AAA: repair with reimplanttion of renals CABG:  1995 gallbladder partial knee replacement  Family History: Last updated: 04/28/2008 Both mother and father had premature CAD  Social History: Last updated: 04/28/2008 Married Retired Lives in [place] Non-smoker Non-drinker  Review of Systems       Denies fever, malais, weight loss, blurry vision, decreased visual acuity, cough, sputum, SOB, hemoptysis, pleuritic pain,  palpitaitons, heartburn, abdominal pain, melena, lower extremity edema, claudication, or rash.   Vital Signs:  Patient profile:   70 year old male Height:      67 inches Weight:      174.50 pounds BMI:     27.43 O2 Sat:      60 % Pulse rhythm:   regular Resp:     18 per minute BP sitting:   170 / 74  (left arm) Cuff size:   large  Vitals Entered By: Vikki Ports (April 27, 2010 9:43 AM)  Physical Exam  General:  Affect appropriate Healthy:  appears stated age HEENT: normal Neck supple with no adenopathy JVP normal rigth CEA and bilateral bruits no thyromegaly Lungs clear with no wheezing and good diaphragmatic motion Heart:  S1/S2 SEM murmur,rub, gallop or click PMI normal Abdomen: benighn, BS positve, no tenderness, S/P  AAA surgery no bruit.  No HSM or HJR Distal pulses intact with no bruits No edema Neuro non-focal Skin warm and dry    Impression & Recommendations:  Problem # 1:  OTHER DYSPHAGIA (ZOX-096.04) Arraanged F/U with Dr Marina Goodell.  Likely neede F/U EGD Orders: Gastroenterology Referral (GI)  Problem # 2:  PURE HYPERCHOLESTEROLEMIA (ICD-272.0) At goal with no side effects using CoEnzyme Q  Samples given His updated medication list for this problem includes:    Crestor 20 Mg Tabs (Rosuvastatin calcium) .[name]... Take one tablet by mouth daily.  CHOL: 111 (04/19/2010)   LDL: 57 (04/19/2010)   HDL: 35.50 (04/19/2010)   TG: 94.0 (04/19/2010) CHOL (goal): 200 (04/30/2009)   LDL (goal): 70 (04/30/2009)   HDL (goal): 40 (04/30/2009)   TG (goal): 150 (04/30/2009)  Problem # 3:  CAROTID ENDARTERECTOMY, RIGHT, HX OF (ICD-V15.1) F/U duplex for known bruits and 40-59% bilateral disease.   Orders: Carotid Duplex (Carotid Duplex)  Problem # 4:  ABDOMINAL AORTIC ANEURYSM REPAIR, HX OF (ICD-V15.1) F/U duplex to include renals as he may need ACE started for BP Orders: Abdominal Aorta Duplex (Abd Aorta Duplex)  Problem # 5:  CORONARY ARTERY BYPASS GRAFT, HX OF  (ICD-V45.81) Stable no angina continue medical Rx  Problem # 6:  HYPERTENSION (ICD-401.9) Home monitoring F/U with me in 4-6 weeks consider starting ACE if over 130/80 consistantly His updated medication list for this problem includes:    Triamterene-hctz 37.5-25 Mg Caps (Triamterene-hctz) .[name]... 1 tab by mouth once daily    Metoprolol Tartrate 50 Mg Tabs (Metoprolol tartrate) .[name]... 1 1/2 tab by mouth two times a day    Aspirin Ec 325 Mg Tbec (Aspirin) .[name]... Take one tablet by mouth daily  Orders: Renal Artery Duplex (Renal Artery Duplex)  Other Orders: EKG w/ Interpretation (93000)  Patient Instructions: 1)  Your physician recommends that you schedule a follow-up appointment in: 4-6 WEEKS WITH DR [name] 2)  Your physician recommends that you continue on your current medications as directed. Please refer to the Current Medication list given to you today. 3)  Your physician has requested that you have an abdominal aorta duplex. During this test, an ultrasound is used to evaluate  the aorta. Allow 30 minutes for this exam. Do not eat after midnight the day before and avoid carbonated beverages. There are no restrictions or special instructions. 4)  Your physician has requested that you have a carotid duplex. This test is an ultrasound of the carotid arteries in your neck. It looks at blood flow through these arteries that supply the brain with blood. Allow one hour for this exam. There are no restrictions or special instructions. 5)  Your physician has requested that you have a renal artery duplex. During this test, an ultrasound is used to evaluate blood flow to the kidneys. Allow one hour for this exam. Do not eat after midnight the day before and avoid carbonated beverages. Take your medications as you usually do. 6)  [facility]   EKG Report  Procedure date:  04/27/2010  Findings:      NSR 58 RBBB No change from previous

## 2010-07-22 NOTE — Miscellaneous (Signed)
Summary: Orders Update  Clinical Lists Changes  Orders: Added new Test order of Carotid Duplex (Carotid Duplex) - Signed 

## 2010-07-22 NOTE — Miscellaneous (Signed)
Summary: Orders Update  Clinical Lists Changes  Orders: Added new Referral order of Echocardiogram (Echo) - Signed 

## 2010-11-02 NOTE — Assessment & Plan Note (Signed)
[name]                            CARDIOLOGY OFFICE NOTE   NAME:[name] [name], [name] [name]                     MRN:          [id number]  DATE:08/06/2007                            DOB:          1941-02-04    [name] [name] returns today for follow-up.  Has known coronary artery disease.  Apparently he was recently hospitalized at [facility] for chest pain.  He had a stress Myoview by Dr. Dietrich [name] February 13.   I read his report.  It indicated that the patient had significant EKG  changes with minor segmental wall motion abnormality and scintigraphic  evidence of basilar anterolateral inferolateral ischemia.  I talked to  [name] [name].  His pain syndrome has been atypical.  He has actually had left  upper quadrant pain in his abdomen with gas.  It is resolved.  He has  not taken any nitroglycerin.  His last heart cath was less than a year  ago in March 2008.   At that time, he had had no change in his anatomy since 2001.  His RIMA  was patent to the LAD, vein graft to the PDA was patent, and the native  circ did not have critical disease.  The patient does have left  subclavian disease which has been stented on a couple of occasions by  Dr. [name].   The patient's heart caths tend to be a quite long and laborious due to  his RIMA and difficulty getting up the left subclavian.  On one occasion  we had to catheterize his left arm separately.   I had a long discussion with [name] [name].  His chest pain syndrome is really  not that acute he is not taking nitroglycerin.  He is not having classic  chest pain.  His initial presentation sounded more like GI problems.  Since he has been cathed less than the year ago with stable anatomy and  is on good medical therapy and since his caths tend to be somewhat more  difficult with increased risk of embolic events given his known  subclavian disease, I think that we should watch him and treat him  medically.  [name] [name] tends to agree.   REVIEW OF SYSTEMS:  Otherwise remarkable for some increase in  depression.  He recently had a 72-year-old grandson killed in a car  accident.  He and his wife are still somewhat teary-eyed about it.  Otherwise negative.   His meds include triamterine hydrochlorothiazide, Lopressor 75 b.i.d.,  Plavix 75 a day, Synthroid 112 mcg a day, Nexium 40 a day, Crestor 20 a  day, coenzyme Q, flaxseed oil, fish oil, Slo-Niacin, an aspirin a day,  Prilosec 20 a day.   EXAM:  Is remarkable for a chronically ill-appearing middle-aged white  male in no distress.  Blood pressure is 150/80 in each arm.  There is no discrepancy.  Lungs were clear.  Carotids have transmitted murmur.  There is no lymphadenopathy,  thyromegaly, JVP elevation.  LUNGS:  Clear diaphragmatic motion.  No wheezing.  S1-S2 is systolic murmur.  PMI normal.  Abdomen is benign.  He has a midline AAA scar.  No bruit.  No  hepatosplenomegaly or hepatojugular reflux.  Distal pulse intact, no edema.  NEURO:  Nonfocal.  SKIN:  Warm and dry.  No muscular weakness.   IMPRESSION:  1. Recent hospitalization for chest pain with abnormal Myoview.      Cath less than a year ago stable anatomy currently not having pain.      Continue medical therapy.  Follow-up in 3 months.  2. Hypercholesterolemia.  Continue Crestor 20 a day, lipid and liver      profile in 6 months.  3. Left subclavian stenosis.  Multiple interventions by Dr. [name].      Blood pressure currently equal in each arm with no left upper      extremity weakness.  Continue to follow duplex exam in 6 months.  4. Reflux.  Continue Nexium 40 a day.  5. Hypothyroidism.  Continue Synthroid.  Dose recently changed 100 mcg      a day.   [name] [name] will call me if he had any significant chest pain.  I given my  condolences regarding his grandson's death.  I will see him back in 3-  month.     [name] [name]. [name] Emms, MD, [name] [name]  Electronically Signed    PCN/MedQ  DD: 08/06/2007  DT:  08/06/2007  Job #: [id number]   cc:   [name] Picket A. Gerda Diss, MD

## 2010-11-02 NOTE — Assessment & Plan Note (Signed)
[facility]                         GASTROENTEROLOGY OFFICE NOTE   NAME:BENFIELDBayley, Hurn                     MRN:          [id number]  DATE:01/01/2007                            DOB:          1940/11/09    REASON FOR EVALUATION:  Surveillance colonoscopy.   HISTORY:  This is a 70 year old white male with a history of  hypertension, hyperlipidemia, coronary artery disease status post  coronary artery bypass grafting, carotid artery disease, peripheral  vascular disease, reflux disease complicated by peptic stricture for  which he has undergone prior esophageal dilation, and hypothyroidism.  He has a history of subclavian stenosis for which he underwent stent  placement in October of 2007.  He is on aspirin and Plavix.  In terms of  prior colonoscopy, he initially underwent a screening examination in  July of 2003 to evaluate constipation and bloating.  He was found to  have diverticulosis as well as diminutive sigmoid colon polyp which was  removed and said to be benign colonic polypoid mucosa.  At that time  followup in 5 years recommended.  Currently the patient is without  active symptoms.  In particular he denies abdominal pain, change in  bowel habits, or rectal bleeding.  For his reflux disease he is  maintained on Nexium.  On the medication he does well without  significant symptoms.  Off medication he can have some intermittent  dysphagia to solids such as meat and bread.  Overall, he states this is  not a significant problem.   PAST MEDICAL HISTORY:  As above.   PAST SURGICAL HISTORY:  1. Coronary artery bypass grafting.  2. Cholecystectomy.   ALLERGIES:  NO KNOWN DRUG ALLERGIES.   CURRENT MEDICATIONS:  1. Triamterine/hydrochlorothiazide 37.5/25 mg daily.  2. Metoprolol 25 mg b.i.d.  3. Plavix 75 mg daily.  4. Levothyroxine 112 mcg daily.  5. Nexium 40 mg daily.  6. Crestor 20 mg daily.  7. Coenzyme Q10.  8. Flax seed oil.  9.  Fish oil.  10.Niacin.  11.Aspirin 325 mg.   FAMILY HISTORY:  Negative for gastrointestinal malignancy.   SOCIAL HISTORY:  Patient is married with 3 children, lives with his  wife.  He is retired from [employer].  Quit smoking 14 years  ago, does not use alcohol.   REVIEW OF SYSTEMS:  Per diagnostic evaluation form.   PHYSICAL EXAMINATION:  Well-appearing male in no acute distress.  Blood  pressure is 166/64, heart rate is 52 and regular, weight is 163.4  pounds, he is 5 feet 6 inches in height.  HEENT:  Sclerae are anicteric, conjunctivae are pink, oral mucosa  intact.  There is no adenopathy.  LUNGS:  Clear.  HEART:  Regular.  ABDOMEN:  Soft without tenderness or mass, there is incisional hernia  which reduces spontaneously.  EXTREMITIES:  Are without edema.   IMPRESSION:  This is a 70 year old gentleman with multiple vascular  problems and general medical problems who presents today regarding  followup colonoscopy.  Given the results of his initial exam, I told him  that current recommendations might be for colonoscopy in 10 years.  Despite this, he stated that he wished to proceed with followup  examination at this time.  I discussed the nature of the procedure as  well the risks, benefits, and alternatives.  He understood and agreed to  proceed.  He did request specifically the OsmoPrep.  We did discuss  issues associated with that preparation choice.  We also discussed the  pros and cons of performing the exam on Plavix.  We have decided to  perform the exam on Plavix and aspirin.     Wilhemina Bonito. Marina Goodell, MD  Electronically Signed    JNP/MedQ  DD: 01/01/2007  DT: 01/01/2007  Job #: [id number]   cc:   Donna Bernard, M.D.  [name]. [name], MD, [facility]

## 2010-11-02 NOTE — Discharge Summary (Signed)
[name] [name] [name], [name] [name] NO.:  [id number]   MEDICAL RECORD NO.:  [id number]          PATIENT TYPE:  INP   LOCATION:  4736                         FACILITY:  [facility]   PHYSICIAN:  Duke Salvia, MD, FACCDATE OF BIRTH:  27-Dec-1940   DATE OF ADMISSION:  07/20/2007  DATE OF DISCHARGE:  07/21/2007                               DISCHARGE SUMMARY   PRIMARY CARDIOLOGIST:  Theron Arista C. [name], MD, [facility].   PRIMARY CARE PHYSICIAN:  Dr. Gerda Diss.   DISCHARGE DIAGNOSIS:  Abdomen and flank pain.   SECONDARY DIAGNOSES:  1. Coronary artery disease, status post coronary artery bypass graft      in 1994.  2. History of abdominal aortic aneurysm, status post repair.  3. Carotid artery disease, status post right carotid endarterectomy.  4. Peripheral vascular disease status post left subclavian stenting.  5. Hypertension.  6. Hyperlipidemia.  7. Chronic obstructive pulmonary disease.  8. Hypothyroidism.  9. Nephrolithiasis.  10.Osteoporosis.  11.Remote tobacco abuse.  12.Gastroesophageal reflux disease.   ALLERGIES:  NO KNOWN DRUG ALLERGIES.   PROCEDURES:  None.   HISTORY OF PRESENT ILLNESS:  A 70 year old Caucasian male with history  of CAD status post CABG, who for three days prior to admission was  having intermittent left upper quadrant abdominal and left flank  discomfort, associated with belching and somewhat improved with  carbonated beverages.  There was also a positional component to it.  He  presented to Dr. Fletcher Anon office and was sent to [facility] ED for  further evaluation.  In the ED, EKG showed no acute changes, and his  symptoms were found to be non-cardiac.  He was placed on observation for  rule out.   HOSPITAL COURSE:  [name] [name] [name] has ruled out for MI by cardiac markers  x2.  He is to continue to have episodic discomfort, relieved with  belching.   LABORATORY WORK:  Lab work is unrevealing.   Will plan for discharge today, in good condition.  I  recommended  followup with Dr. Gerda Diss.  Will also arrange for an outpatient Myoview  in [place].   DISCHARGE LABS:  Hemoglobin 15.5, hematocrit 43.8, WBCs 6.5, platelets  185, sodium 138, potassium 3.8, chloride 102, CO2 27, BUN 17, creatinine  28.9, glucose 89, total bilirubin 1.0, alkaline phosphatase 76, AST 28,  ALT 27, total protein 6.4, albumin of 4.0, calcium 9.6, magnesium 2.3,  amylase 83, lipase 33, CK 86, MB 2.6, troponin-I 0.01, TSH 3.444.   DISPOSITION:  The patient is being discharged home today in good  condition.   FOLLOWUP PLANS AND APPOINTMENTS:  Will arrange for followup with Dr.  [name] in approximately 2 weeks in our [place] office, along with a  Myoview, which will be performed at [facility].  She is asked to follow  up with Dr. Gerda Diss in one to two weeks.   DISCHARGE MEDICATIONS:  1. Crestor 20 mg b.i.d.  2. Aspirin 325 q.d.  3. Triamterine/HCTZ 37.5/25 mg q.d.  4. Plavix 75 mg q.d.  5. Lopressor 75 mg b.i.d.  6. Niacin 1 gram q.h.s.  7. Synthroid 112 mcg q.d.  8. Flax seed oil 200 mg q.d.  9. Fish oil 2000 mg q.d.  10.Prilosec 20 mg q.d.  11.Nitroglycerin 0.4 mg sublingual p.r.n. chest pain.   OUTSTANDING LAB STUDIES:  None.   Duration of discharge encounter 35 minutes, including physician time.      [name] [name] [name], ANP      Duke Salvia, MD, [name]  Electronically Signed    CB/MEDQ  D:  07/21/2007  T:  07/21/2007  Job:  [id number]   cc:   Lorin Picket A. Gerda Diss, MD

## 2010-11-02 NOTE — Assessment & Plan Note (Signed)
[facility]                               LIPID CLINIC NOTE   NAME:BENFIELDTracey, Stewart                     MRN:          [id number]  DATE:04/05/2007                            DOB:          [birth year]    This is a return office visit to the lipid clinic for followup.   PAST MEDICAL HISTORY:  1. Hyperlipidemia.  2. Coronary artery disease status post coronary artery bypass graft in      1995.  3. Peripheral vascular disease.  4. Carotid disease.  5. Hypertension.  6. Gastroesophageal reflux disease.  7. Hyperthyroidism.   CURRENT MEDICATIONS:  1. Triamterene & hydrochlorothiazide 37.5/25 mg daily.  2. Toprol 75 mg daily.  3. Plavix 75 mg daily.  4. Levothyroxine 100 mg daily.  5. Nexium 40 mg daily.  6. Crestor 20 mg q.h.s.  7. Coenzyme Q-10 100 mg q.h.s.  8. Flax seed oil 2000 mg q.h.s.  9. Fish oil 2000 mg two capsules q.h.s.  10.Niacin 1000 mg q.h.s.  11.Aspirin 325 mg daily.   EXAMINATION:  VITAL SIGNS:  Weight was 164 pounds, blood pressure 162/78  and a pulse of 56.   LABORATORY DATA:  Total cholesterol 135, triglycerides 142, HDL 38.4,  and LDL 68, LFTs within normal limits.   ASSESSMENT:  Ms. Rapozo is a pleasant gentleman who returns to the  lipid clinic today denying chest pain, shortness of breath, no muscles  aches or pains and is compliant with current medication regimen.  He has  recently been under some stress due to a recent family tragedy, a  grandson dying in early September and his sister 2 weeks following.  As  such, his diet has changed quite a bit.  He previously was very  successful on a heart healthy diet but now has ingested more high fat  foods like bar-B-Q and steaks and has frequently taken to snacking.  We  discussed ways to slowly improve his diet.  In regards to exercise, he  used to walk more than 3 miles per day but now has fallen back to only  walking 1-2 miles once or twice a week.  Our goal for him is  to slowly  increase to 2 miles 3-5 days a week as tolerated.  We also discussed  ways to improve his food choices and to cut out fried foods but realize  that this is difficult with his current life situation, helping his son.  His lipid panel looks very good despite stress.  LFTs within normal  limits, LDL and total cholesterol is at goal, HDL is near goal and  triglycerides have nearly doubled.  These changes can be explained by  his noncompliance with diet and exercise in regards to recent stress.   PLAN:  Continue current medications and plan to slowly resume previous  diet and exercise regimen as it was successful for this patient. Plan to  followup in 3 months to review lipid panel and LFTs and to make  adjustments if needed at that time.      Leota Sauers, PharmD  Electronically Signed  Thomas C. Daleen Squibb, MD, [facility]  Electronically Signed   LC/MedQ  DD: 04/05/2007  DT: 04/05/2007  Job #: [id number]

## 2010-11-02 NOTE — Assessment & Plan Note (Signed)
[facility]                               LIPID CLINIC NOTE   NAME:[name] [name]                     MRN:          [id number]  DATE:01/03/2008                            DOB:          06/30/1940    The patient was seen back in the Lipid Clinic for further evaluation and  medication titration associated with his hyperlipidemia in the setting  of documented coronary artery disease.  The patient states he has been  feeling and doing well overall.  He has been continuing on his Crestor  20 mg daily.  He asked if there is a cheaper alternatives available for  his niacin, as this has been very difficult for him to afford.  The  patient has been feeling and doing well overall.  He has had no muscle  aches, pain, weakness, fatigue, or other problems.  He has been  compliant with his therapy.  He has been a little more comfortable since  his last visit.  He did go to the beach last week.  It is possible that  he will have to eventually succumb to right knee replacement due to  periodic continuing knee pain, though he has continued his 3 mile daily  walk in [employer].   The patient's past medical history is pertinent for documented coronary  artery disease.   Current medication has been updated on the chart.   PHYSICAL EXAMINATION:  Weight today in the office is not obtained.  His  blood pressure is 170/72, this was on my recheck.  Heart rate was 60 and  respirations were 16.   Labs reveal good progress towards target, and have been appended to the  chart   ASSESSMENT:  [name] [name] has documented coronary artery disease and  meets his goals through lipid-lowering therapy at this time.  I  suggested to him from a cost standpoint that he can continue on his  Crestor 20 mg a day and switch over to Slo-Niacin which is available in  a 1 g over-the-counter tablet, which will potentially save him  significant dollars.  He will follow back up with me in the  spring, if  not sooner if he has questions or problems in the meantime.  He will let  me know if he did develops muscle aches, pains, weakness, fatigue, or  other problems and call with question.   I appreciate the opportunity to see this pleasant patient on day of  service on January 03, 2008.  He will follow up with me on June 26, 2008.     Shelby Dubin, PharmD, BCPS, CPP  Electronically Signed      Madolyn Frieze. Jens Som, MD, [facility]  Electronically Signed   MP/MedQ  DD: 01/24/2008  DT: 01/25/2008  Job #: [id number]

## 2010-11-02 NOTE — Procedures (Signed)
CAROTID DUPLEX EXAM   INDICATION:  Followup carotid and left subclavian artery disease.   HISTORY:  Diabetes:  No.  Cardiac:  CABG in 1995, CAD.  Hypertension:  Yes.  Smoking:  Quit.  Previous Surgery:  Redo, left SCA stent, 04/05/06, right CEA with DPA,  05/24/04, by Dr. Hart Rochester.  CV History:  No.  Amaurosis Fugax No, Paresthesias No, Hemiparesis No.                                       RIGHT             LEFT  Brachial systolic pressure:         145               152  Brachial Doppler waveforms:         Triphasic         Triphasic  Vertebral direction of flow:        Antegrade         Antegrade  DUPLEX VELOCITIES (cm/sec)  CCA peak systolic                   M=143             M=149  ECA peak systolic                   133               223  ICA peak systolic                   80                186  ICA end diastolic                   21                43  PLAQUE MORPHOLOGY:                  Mixed             Mixed  PLAQUE AMOUNT:                      Moderate          Moderate  PLAQUE LOCATION:                    Proximal CCA      ICA/CCA/ECA   IMPRESSION:  1. Evidence of right internal carotid artery stenosis of 1-39%.  2. Evidence of left internal carotid artery stenosis of 40-59%.  3. Evidence of left external carotid artery stenosis.  4. Stable right proximal common carotid artery stenosis with      velocities of 323 cm/s.  5. Stable left proximal common carotid artery stenosis with velocities      of 231 cm/s, distal of 212 cm/s.  6. Stable left subclavian artery velocities of 269 cm/s.   ___________________________________________  Quita Skye. Hart Rochester, M.D.   AS/MEDQ  D:  01/29/2008  T:  01/29/2008  Job:  [id number]

## 2010-11-02 NOTE — Assessment & Plan Note (Signed)
[facility]                               LIPID CLINIC NOTE   NAME:[name] [name], [name] [name]                     MRN:          [id number]  DATE:07/12/2007                            DOB:          10-16-1940    Return office visit to the Lipid Clinic.   PAST MEDICAL HISTORY:  Hyperlipidemia, coronary artery disease status  post coronary bypass graft in 1995.  PVD, carotid disease, hypertension,  gastroesophageal reflux disease, and hypothyroidism.   MEDICATIONS:  Triamterene hydrochlorothiazide 37.5/25 mg daily,  metoprolol 75 mg twice daily, Plavix 75 mg daily, Synthroid 112 mcg  daily.  Prilosec 20 mg daily, Crestor 20 mg daily, Coenzyme Q-10 100 mg  daily, flaxseed oil 2000 mg daily, fish oil 2000 mg daily, Slo-Niacin  1000 mg daily, aspirin 325 mg daily.   LABORATORY DATA:  Total cholesterol 117, triglyceride 123, HDL 39.5, LDL  53.  LFTs within normal limits.   VITAL SIGNS:  Weight 167 pounds, blood pressure was 145/60, heart rate  was 60.   ASSESSMENT:  [name] [name] is a very pleasant gentleman to retest lipid  clinic today with no chest pain and stress of breath.  No muscle aches  or pains.  He is feeling well and doing well.  He continues to walk 3  miles most days of the week.  He says that he walks at a pretty quick  pace and has somewhat of a hilly area where he does exercise.  He states  that he follows a low-fat, low-carbohydrate diet.  However, over the  last 6 months, he has gained 8 pounds.  He does state that he had  stopped exercising and had not followed his low-fat, low-carbohydrate  diet prior to the Christmas season.  However, since then he has been  relatively conscientious about his food intake and has restarted  exercise regimen.  His cholesterol panel is improved since last visit,  his total cholesterol is down and now less than 200, triglyceride went  from 148 to 123 with goal less than 150, HDL is increased from 38 just  about  40, goal being greater than 40, and LDL has decreased 68-53 pounds  goal being less than 70,  non-HDL at goal less than 100.   PLAN:  1. To continue current medication regimen.  2. Decrease the fats and carbohydrates in diet.  3. Increase exercise, pace of his walking.  4. Decrease weight at next visit.  5. Follow-up visit in 6 months for lipid panel and LFTs.  Will make      adjustments as needed at that time.      [name] [name], PharmD  Electronically Signed      [name] Sans. Daleen Squibb, MD, [facility]  Electronically Signed   LC/MedQ  DD: 07/12/2007  DT: 07/12/2007  Job #: [id number]

## 2010-11-02 NOTE — Assessment & Plan Note (Signed)
[facility]                            CARDIOLOGY OFFICE NOTE   NAME:[name] [name], [name] [name]                     MRN:          [id number]  DATE:10/26/2007                            DOB:          08/13/1940    Vince returns today for followup.  He has had a headache over the last  24 hours.   The patient mowed his lawn yesterday.  He may have some allergic  reaction.   I last saw Torryn on August 06, 2007.   At that time the patient had an abnormal Myoview suggesting  anterolateral inferolateral wall ischemia.  The patient had been having  atypical pain.  It was primarily belly pain.  He was not taking any  nitroglycerin.  His last heart cath in March of 2008, had patent RIMA to  the LAD, patent vein graft to the PDA and the circ did not have critical  disease.   He has a stent in his left subclavian.   The patient's heart caths tend to be difficult.  On occasion we had to  cath his left arm separately to get into his arterial graft.   After discussion with him on August 06, 2007, we decided to continue  to treat him medically.   The patient was in agreement with this.   Part of the issue had been some recent depression with a 18-year-old  grandson that was killed in a car accident.   The patient was otherwise doing well outside of his headache and his  review of systems was negative.  He has been compliant with his meds.  He sees Dr. Hart Rochester on a regular basis.  I do not have recent records  from his carotid and upper extremity ABIs.  As I recall, Fard is status  post right CEA and has residual disease on the left.   He is to see Dr. Hart Rochester again in August.   His meds include triamterene hydrochlorothiazide 37/12.5, Plavix 75 a  day, coenzyme Q, aspirin a day, Prilosec 20 day, Synthroid 125 mcg a  day, Zocor 40 day, sucralfate 1 gram a day, Crestor 20 a day.   Exam is remarkable for a chronically ill-appearing, elderly white male  in no  distress.  His blood pressure is 150/70, pulse is 16 and regular, weight is 165.  Blood pressures are not disparate in each arm.  HEENT:  Remarkable for bilateral carotid bruits, right greater than  left, with a right CEA scar.  No lymphadenopathy, thyromegaly or JVP  elevation.  LUNG:  Clear with diaphragmatic motion.  No wheezing.  There is and S1 and S2 with a systolic ejection murmur of aortic  sclerosis.  He has a bruit under the left clavicle.  ABDOMEN:  Protuberant.  Status post AAA repair, with a large ventral  hernia.  Distal pulses are intact.  PTs are only +1.  There are faint bilateral  femoral bruits.  NEURO:  Nonfocal.  SKIN:  Warm and dry.  No muscular weakness.   IMPRESSION:  1. Coronary disease, previous coronary artery bypass graft in 1994,  stable anatomy on cath in March of 2008.  Abnormal Myoview but      currently asymptomatic.  Continue medical therapy, aspirin and beta-      blockers, as well as Plavix.  2. Hypercholesterolemia.  Continue Crestor.  Lipid and liver profile      in 6 months.  3. Carotid disease.  Previous right carotid endarterectomy, residual      disease on the left side, followed by Dr. Hart Rochester.  Will try to get      copies of his most recent duplex.  4. Previous abdominal aortic aneurysm surgery and no abdominal aorta      palpable.  No evidence of recurrence. Follow up with Dr. Hart Rochester.  5. Previous left subclavian stent.  Left internal mammary artery not      used for bypass.  Blood pressure is not disparate.  Follow up with      Dr. Hart Rochester.  Stent would appear to be patent and was widely patent      by cath in March of 2008.  6. Hypothyroidism.  Continue Synthroid 125 mcg a day.  TSH in 6      months.  7. History of reflux.  Continue Nexium.  Avoid spicy foods and late-      night meals.  8. Previous lower extremity edema, now gone.  Continue triamterene      hydrochlorothiazide.  Follow-up BMET on a yearly basis.   Despite being  a vasculopath, Jermayne appears to be stable.  He is not  having angina.  I will see him back in 6 months.     [name]. [name], MD, [facility]  Electronically Signed    PCN/MedQ  DD: 10/26/2007  DT: 10/26/2007  Job #: [id number]

## 2010-11-02 NOTE — H&P (Signed)
[name] [name], [name] [name]              ACCOUNT NO.:  [id number]   MEDICAL RECORD NO.:  [id number]          PATIENT TYPE:  INP   LOCATION:  1844                         FACILITY:  [facility]   PHYSICIAN:  Gerrit Friends. Dietrich Pates, MD, FACCDATE OF BIRTH:  May 11, 1941   DATE OF ADMISSION:  07/20/2007  DATE OF DISCHARGE:                              HISTORY & PHYSICAL   CARDIOLOGY OBSERVATION NOTE   REFERRING PHYSICIAN:  Scott A. Gerda Diss, MD   PRIMARY CARDIOLOGIST:  [name]. [name], MD, [facility]   HISTORY OF PRESENT ILLNESS:  A 70 year old gentleman with a history of  widespread vasculopathy including coronary disease requiring CABG  surgery in 1995 now presents with left abdominal/flank discomfort.  Mr.  Wotton has done quite well since bypass surgery.  He most recently  underwent coronary angiography in March of 2008 at which time he was  found to have severe native vessel disease including total obstruction  of the left main.  There was a patent RIMA graft to the LAD and a patent  saphenous vein graft to the RCA.  The circumflex was unbypassed - it is  not clear whether a graft had been placed in 1995.  Similarly, the LIMA  was atretic, but it was unclear whether that vessel had been used in a  graft procedure as well.  He has had significant left subclavian disease  with two stents placed in separate procedures.  He has carotid disease  and has undergone a right carotid endarterectomy.  He has undergone  surgical repair of an abdominal aortic aneurysm.   Cardiovascular risk factors include hypertension and dyslipidemia.  He  has a 60 pack year consumption of cigarettes that was discontinued 15  years ago.  There is no history of diabetes.  He also has COPD,  nephrolithiasis and osteoporosis.  He has hypothyroidism.   Current symptoms began approximately 3 days ago.  He notes sharp  momentary left lateral/flank discomfort without tenderness or associated  symptoms.  There is no radiation.  There is  no associated dyspnea,  diaphoresis nor nausea.  This evening he obtained relief by eructation.  He did not seek attention today but was visiting his physician's office  to drop off paperwork and mentioned the problem and was ushered in for  an appointment, transferred to the [facility] emergency department and  then to [facility].   CURRENT MEDICATIONS:  1. Rosuvastatin 20 mg daily.  2. Enteric coated aspirin 325 mg daily.  3. Triamterene/HCTZ 37.5/25 mg daily.  4. Coenzyme-Q.  5. Clopidogrel 75 mg daily.  6. Metoprolol 75 mg b.i.d.  7. Niaspan 1000 mg daily at bedtime.  8. Levothyroxine 0.112 mg daily.  9. Fish oil 1000 mg b.i.d.  10.Prilosec 20 mg p.r.n. sublingual nitroglycerin p.r.n. - rarely use.   SOCIAL HISTORY:  Married and lives in [place]; retired; no excessive  use of alcohol.   FAMILY HISTORY:  Positive for CVA in his mother who also had coronary  disease and fatal coronary disease in his father.  He has siblings with  CVA, neoplastic disease, coronary disease and multiple sisters with  diabetes.  REVIEW OF SYSTEMS:  Review of systems is notable for arthralgias, GERD  symptoms, cold intolerance and sinus drainage.  All other systems  reviewed and are negative.   PHYSICAL EXAMINATION:  GENERAL:  A pleasant, trim gentleman in no acute  distress.  VITAL SIGNS:  Temperature 96.8, heart rate 58 and regular, respirations  18, blood pressure 135/72, O2 saturation 97% on room air.  HEENT:  EOMs full; normal lids and conjunctivae; normal oral mucosa.  NECK:  No jugular venous distention; right carotid endarterectomy scar;  harsh and extremely prominent right carotid bruit; left prominent left  carotid bruit.  LUNGS:  Decreased breath sounds; some prolongation of the expiratory  phase; minimal rhonchi.  CARDIAC:  Normal first and second heart sounds; fourth heart sound  present; normal PMI.  ABDOMEN:  Soft and nontender; no organomegaly; normal bowel sounds  without  bruits.  EXTREMITIES:  Decreased distal pulses; no edema.  NEUROLOGICAL:  Symmetric strength and tone; normal cranial nerves.  SKIN::  No significant lesions.  PSYCHIATRIC:  Alert and oriented; normal affect.  ENDOCRINE:  No thyromegaly.   Laboratory studies are pending.  EKG shows sinus bradycardia, borderline  first degree AV block; delayed R-wave progression and modest nonspecific  ST segment abnormality.   IMPRESSION:  Mr. Duross has symptoms which are not suggestive of  cardiac origin.  They are not highly suggestive of any particular entity  whatsoever.  He merits overnight observation with assessment of basic  laboratories, serial cardiac markers and EKGs.  If symptoms are improved  or no worse in the morning, I anticipate that he can be discharged to be  reassessed by his primary medical doctor if symptoms persist.  I do not  think he requires any specific cardiac testing.      Gerrit Friends. Dietrich Pates, MD, [facility]  Electronically Signed     RMR/MEDQ  D:  07/20/2007  T:  07/21/2007  Job:  [id number]

## 2010-11-02 NOTE — Procedures (Signed)
CAROTID DUPLEX EXAM   INDICATION:  Bilateral carotid artery and left subclavian artery  disease.   HISTORY:  Diabetes:  No  Cardiac:  CAD, CABG in 1995  Hypertension:  Yes  Smoking:  Quit  Previous Surgery:  Redo left SCA stent on 04/05/2006, right CEA with DPA  on 05/24/2004 by Dr. [name]  CV History:  Asymptomatic  Amaurosis Fugax No, Paresthesias No, Hemiparesis No                                       RIGHT             LEFT  Brachial systolic pressure:         160               165  Brachial Doppler waveforms:         Triphasic         Triphasic  Vertebral direction of flow:        Antegrade         Antegrade  DUPLEX VELOCITIES (cm/sec)  CCA peak systolic                   127 (mid)         144 (mid)  ECA peak systolic                   95                183  ICA peak systolic                   84                188  ICA end diastolic                   17                31  PLAQUE MORPHOLOGY:                  Mixed             Mixed  PLAQUE AMOUNT:                      Moderate          Moderate  PLAQUE LOCATION:                    Proximal CCA      Proximal ICA/CCA   Proximal right CCA peak systolic velocity equals 306 cm/sec.  Proximal left CCA peak systolic velocity equals 245 cm/sec.  Proximal left SCA peak systolic velocity equals 242 cm/sec.    IMPRESSION:  1. Evidence of right ICA stenosis of 1%-39%.  2. Evidence of left ICA stenosis of 40%-59%.  3. Evidence of left ECA stenosis.  4. Bilateral proximal CCA stenosis.  5. Left SCA in-stent velocities show no significant change from      previous study.   ___________________________________________  [name] [name], M.D.   AS/MEDQ  D:  02/13/2007  T:  02/14/2007  Job:  [id number]

## 2010-11-02 NOTE — Assessment & Plan Note (Signed)
[facility]                            CARDIOLOGY OFFICE NOTE   NAME:Hocutt, MARCIA LEPERA                     MRN:          [id number]  DATE:04/30/2008                            DOB:          [birth year]    Marq returns today for followup.  He is a vasculopath.  He has had a  previous right CEA and left subclavian stent.  He has had coronary  artery bypass surgery for left main disease and AAA repair.  He has been  having some muscle cramps, particularly in his legs and hands.  He  thinks it may be related to his statin.  I told him to stop it for 4-6  weeks and we would reassess.   His last lipid profile in December 31, 2007 showed an LDL of 69 and normal  LFTs.   Otherwise, Flor is doing well.  He has not had any TIAs.  He is not  having any weakness in his left arm.  He is not having any significant  chest pain.  His last catheterization was done on August 28, 2006.  He  had a patent LIMA to the LAD, a patent vein graft to the right coronary  artery and no critical circ disease.   His review of systems is otherwise negative outside of his muscle  cramps.   He is on:  1. Coenzyme Q.  2. Niacin.  3. Triamterene hydrochlorothiazide.  4. Metoprolol 75 mg b.i.d.  5. Plavix 75 a day.  6. Synthroid 112 mcg a day.  7. Crestor 20 a day to be held.  8. Flaxseed oil.  9. Fish oil.  10.Prilosec 20 a day.  11.Synthroid 125 mcg a day.  12.Sucralfate 1 g a day.  13.He has p.r.n. nitroglycerin.   PHYSICAL EXAMINATION:  GENERAL:  Remarkable for an elderly white male in  no distress.  VITAL SIGNS:  His weight is 174, blood pressure 136/66 in the right arm,  pulse 61 and regular, respiratory rate 14, and afebrile.  HEENT:  Unremarkable.  NECK:  Bilateral carotid bruits with a right CEA scar.  LUNGS:  Clear good diaphragmatic motion.  No wheezing.  CARDIAC:  S1 and S2 with a systolic ejection murmur.  PMI normal.  ABDOMEN:  Protuberant with a ventral hernia  and previous AAA repair.  Abdominal aorta not palpable.  No tenderness.  No hepatosplenomegaly or  hepatojugular reflux.  EXTREMITIES:  Distal pulses are intact.  No edema.  NEURO:  Nonfocal.  SKIN:  Warm and dry.  No muscular weakness.   IMPRESSION:  1. Coronary artery disease, previous coronary artery bypass graft, not      having chest pain.  Catheterization on March 2008, stable.      Continue aspirin and beta-blocker.  2. Bilateral carotid disease.  I reviewed the report from Dr. Candie Chroman      office dated January 29, 2008.  He had 40-59% left internal carotid      artery disease with continued elevated velocities in his common      carotids and subclavians, but all was stable.  I have recommended  followup in 6 months.  3. History of abdominal aortic aneurysm repair, stable.  No abdominal      pain.  No palpable masses on exam.  4. Cramping with statin therapy.  Hold Crestor for 4-6 weeks.  He will      let me know.  Needless to say, I would like to get him back on a      statin drug if possible.  We will continue his niacin.   Overall, I think Rayen despite being a severe vascular path, he is doing  well.  I will see him back in 6 months.     [name]. [name], MD, [facility]  Electronically Signed    PCN/MedQ  DD: 04/30/2008  DT: 04/30/2008  Job #: [id number]

## 2010-11-05 NOTE — Assessment & Plan Note (Signed)
[facility]                               LIPID CLINIC NOTE   NAME:[name], [name]                     MRN:          [id number]  DATE:10/19/2006                            DOB:          11-23-1940    Return office visit for LIPID Clinic.   PAST MEDICAL HISTORY:  1. Hyperlipidemia.  2. Coronary artery disease, status post coronary artery bypass graft      in 1995.  3. Peripheral vascular disease.  4. Carotid artery disease.  5. Hypertension.  6. Gastroesophageal reflux disease.   MEDICATIONS:  1. Crestor 20 mg daily.  2. Aspirin 325 mg daily.  3. Triamterene/hydrochlorothiazide 37.5/25 mg daily.  4. Coenzyme Q10, 100 mg once daily.  5. Plavix 75 mg daily.  6. Fish Oil 1 g twice daily.  7. Metoprolol 75 mg twice daily.  8. Slo-Niacin 1000 mg at bedtime.  9. Synthroid 0.112 mg daily.  10.Nexium 40 mg daily.  11.Flaxseed twice daily.   VITAL SIGNS:  Weight 159 pounds, blood pressure is 160/90 in the right  arm, 150/90 in the left arm, heart rate is 55.   LAB DATA:  Total cholesterol 123, triglyceride 70, HDL 42, LDL 67, LFTs  within normal limits.   ASSESSMENT:  [name] [name] is a pleasant 70 year old gentleman who  returns to the clinic today with no chest pain, no shortness of breath,  no muscle aches or pains.  He is compliant with medication regimen.  He  has recently had a re-do stent to his subclavian.  He had previously  been complaining of chest pain and had a cath last month which was  clean.  He states that he is longer having chest pain.  He had  previously been doing sit-ups and would have bothered his hiatal hernia.  He has stopped his sit-ups and now his chest pain has resolved.  He  previously has stated he was following a heart healthy diet; however,  continued to gain weight.  Started the [employer] about 3-4 months ago  and has lost 10 pounds since November of 2007.  He states that he is  finishing up his meals with  the [employer] but understands now what  heart healthy diet means, low fat, low carbohydrate and lots of fruits  of vegetables and lean protein.  He states that he is going to continue  this healthy eating style and will stop ordering meals from [employer].  He also is compliant with walking 3 miles 6 days a week.  Total  cholesterol with a goal of less than 200, triglycerides a goal of less  than 150, HDL a goal of greater than 40, LDL a goal of less than 70, non-  HDL is at goal of less than 100.  He does have elevated blood pressure  at this appointment, which he normally has; however, he states that his  blood pressure at home runs around 130/60.   PLAN:  1. Continue heart healthy diet.  2. Continue exercise.  3. No change in medications.  4. Followup visit in 6 months for lipid  panel and for LFTs and will      make adjustments needed at that time.      Leota Sauers, PharmD  Electronically Signed      Jesse Sans. Daleen Squibb, MD, [facility]  Electronically Signed   LC/MedQ  DD: 10/19/2006  DT: 10/19/2006  Job #: [id number]

## 2010-11-05 NOTE — Assessment & Plan Note (Signed)
[facility]                              CARDIOLOGY OFFICE NOTE   NAME:[name] [name], [name] [name]                     MRN:          [id number]  DATE:05/04/2006                            DOB:          [birth year]    RETURN OFFICE VISIT FOR LIPID CLINIC:   PAST MEDICAL HISTORY:  1. Hyperlipidemia.  2. Coronary artery disease status post coronary artery bypass graft in      1995.  3. Peripheral vascular disease.  4. Carotid disease.  5. Hypertension.  6. Gastroesophageal reflux disease.   CURRENT MEDICATIONS:  1. Crestor 20 mg daily.  2. Aspirin 325 mg daily.  3. Triamterene/hydrochlorothiazide 37.5/25 mg daily.  4. Coenzyme-Q-10, 100 mg daily.  5. Plavix 75 mg daily.  6. Fish oil 1 g t.i.d.  7. Metoprolol 75 mg in the morning and 75 mg in the evening.  8. Slo-Niacin 1000 mg q.h.s.  9. Synthroid 112 mcg daily.  10.Nexium 40 mg daily.  11.__________ oil twice daily.   VITAL SIGNS:  Weight was 168 pounds, blood pressure 142/75, heart rate 52.   LAB DATA:  Total cholesterol 128, triglycerides 127, HDL 33.4, LDL 69, LFTs  within normal limits.   ASSESSMENT:  [name] is a very pleasant gentleman, who returns to the  lipid clinic today with no chest pain, no shortness of breath, no muscle  aches or pains, compliant with current medication regimen.  He exercises by  walking 3 miles daily.  He eats a fairly heart-healthy diet.  He eats salads  at least once daily.  He eats no fried foods.  Most of his protein is lean  chicken and fish.  Very occasionally does he eat bacon and eggs for  breakfast.  He continues to have ongoing atherosclerosis.  He had in-stent  restenosis that was again restented of his subclavian recently in October  2007 by Dr. Hart Rochester.  He has had peripheral vascular disease for years and  had a history of tobacco abuse.  His lipid panel is at goal; however, he has  had a decrease in his HDL since last visit.  He does say that he  may have  decreased amount of exercise and he is up about 3 pounds since last visit.  He said he is having some issues with his thyroid, and his medications are  being adjusted by his primary physician.  His total cholesterol is at goal  of less than 200.  Triglycerides are at goal of less than 150.  LDL is at  goal less than 70, and HDL slightly less than goal of greater than 40.  LFTs  within normal limits.   PLAN:  1. Continue current medication regimen.  2. Possibility of needing to increase Niacin in the future.  3. Continue heart-healthy diet and exercise regimen.  4. Follow up visit in 6 months for lipid panel and LFTs and make      adjustments if needed at that time.      Leota Sauers, PharmD  Electronically Signed      Jesse Sans. Daleen Squibb, MD, [facility]  Electronically Signed  LC/MedQ  DD: 05/04/2006  DT: 05/04/2006  Job #: [id number]

## 2010-11-05 NOTE — Assessment & Plan Note (Signed)
[facility]                            CARDIOLOGY OFFICE NOTE   NAME:[name] [name] RATHKE                     MRN:          [id number]  DATE:08/23/2006                            DOB:          08/28/1940    [name] [name] returns today for followup.  He is a somewhat complicated  vasculopath whom I have been following for quite some time.  [name] [name] has a  history of coronary artery disease.  He had CABG back in 1994.  He  initially underwent aortic aneurysm repair at [facility] and had a  subendocardial MI perioperatively.  He was subsequently found to have  left main disease and had CABG.  He has not had repeat cath since that  time, even though his bypasses are now 72 years old.   The patient's last Myoview on October 10, 2005, was abnormal.  He had  apical and high lateral ischemia and EKG changes.  However, at the time,  [name] [name] was not having any chest pain and we decided to treat him  medically.  He comes in now, having had two to three episodes of chest  pain, which are somewhat worrisome.  He seems to think they were  relieved with a GI cocktail, but they are central in his chest with some  radiation to his shoulders.  One of the episodes was with exertion.  The  other two were not.  I had a long discussion with [name] [name] and told him  that, since his bypass grafts are so old, I would be concerned.  This  and also knowing that his last Myoview was abnormal, we discussed the  need for heart catheterization.  He agrees.   The patient also has a past history significant for right carotid  endarterectomy and left subclavian stenting.  I am also concerned that  previous work on his left subclavian may have jeopardized the LIMA take-  off.   I will have to pull the films, but I believe Dr. Hart Rochester just re-  intervened on this.   Otherwise, his past medical history is remarkable for  hypercholesterolemia, hypertension, both well-treated; aneurysm repair  with stable  configuration by ultrasound.   PATIENT'S MEDICATIONS INCLUDE:  1. Crestor 20 a day.  2. An aspirin a day.  3. Triamterene hydrochlorothiazide.  4. Co-enzyme Q.  5. Plavix 75 a day.  6. Lopressor 25 b.i.d.  7. Slow niacin 1 g q.h.s.  8. Synthroid 112 micrograms a day.  9. Nexium 40 a day.   EXAM:  Blood pressure is equal to 160/70 in the right arm with a pulse  of 52 and 150/80 in the left arm.  HEENT:  Normal.  He has bilateral  carotid bruits and a left subclavian bruit.  He has a previous right CA  scar.  The lungs are clear.  There is an S1 and S2 with a soft systolic  murmur and a previous CABG scar.  He has a ventral hernia with a  previous AAA scar.  He has +3 palpable femorals with faint left femoral  bruit.  Pulses are intact with no edema.  Neurologic is nonfocal.  Skin  is warm and dry.  HEENT is otherwise unremarkable.   His EKG shows sinus rhythm with previous inferior wall MI, nonspecific  STT-wave changes.   IMPRESSION:  [age]-year-old bypass grafts with recurrent pain, some  atypical features, but somewhat concerning for angina.  The risks of  catheterization were discussed with the patient.  He is imminently aware  of them, given his multiple previous interventions by Dr. Hart Rochester and his  CABG.  He is willing to proceed.  He understands the small risk of  stroke, bleeding and dye reaction.   He knows to go to the emergency room, if he were to have any significant  episodes of prolonged pain.  For the time being, we will continue his  current medications.  His blood pressure is improved.  We will try to  review his most recent left subclavian films in regards to making LIMA  injection easier.  He will continue his aspirin and Plavix for  anticoagulation.  He is also on a beta blocker and statins, as he should  be, given his vasculopathies.  Further recommendations will be based on  the results of his heart cath.     [name]. [name], MD, [facility]   Electronically Signed    PCN/MedQ  DD: 72/10/2006  DT: 08/23/2006  Job #: [id number]

## 2010-11-05 NOTE — Assessment & Plan Note (Signed)
[facility]                              CARDIOLOGY OFFICE NOTE   NAME:[name] [name], [name] [name]                     MRN:          [id number]  DATE:03/07/2006                            DOB:          1941-05-24    [name] returns today for followup.  He is a vascular path.  He has had  previous carotid surgery.  He has increasing carotid stenoses on the left  side.  He has had a previous left subclavian stent and this also may be  having a problem.  Apparently, Dr. Hart Rochester is going to recheck this with  possible balloon procedure to the left subclavian, October 18th.  We have  had some discrepant numbers with our carotid duplexes.  I told [name] that I  would prefer for him to see Dr. Hart Rochester and get all of his duplex studies  there since Dr. Hart Rochester is following him closely.  He has had triple A repair  with CABG as well.  I explained to him that the subclavian stenosis is  important in terms of blood flow to the lumen.   He does get occasional tingling in his left arm, but I do not think that it  is urgent in regards to revascularization of the left subclavian.   I am more concerned with the followup of his carotid disease on the left  side.   He does not have any significant angina, and his Myoview in April was fairly  low risk.   His LV function is in the mildly decreased range.   He has been compliant with his medications.   EXAMINATION:  GENERAL:  On exam, he looks well.  VITAL SIGNS:  His blood pressure is 160 systolic in both arms.  NECK:  He has bilateral carotid and subclavian bruits.  LUNGS:  Clear.  HEART:  There is normal S1, S2 with a systolic murmur.  ABDOMEN:  Protuberant with a scar.  He does not have renal bruits.  MCTA  done a year ago showed patent renals and/or possible renal bypass with no  evidence of renal artery stenosis.  EXTREMITIES:  He has bilateral femoral bruits.  Distal pulses are palpable.   IMPRESSION:  Stable vascular  path.  Coronary disease stable.  Low risk  Myoview.  Previous subclavian stents for re-look and possible balloon  treatment by Dr. Hart Rochester.  At this time, I suspect he will also inject the  left carotid to get a definitive diagnosis of the degree of stenosis here.  I suspect it will be greater than 80%.   Care would be taken in regards to his subclavian stents since he has a LIMA  coming off, I believe.  From a bypass standpoint, he is doing well.  His  risk factor is well modified.  He is eating healthy, his LDL cholesterol is  under 80.   He stopped smoking at the time of his triple A surgery.   Further recommendations will be based on the results of his procedure with  Dr. Hart Rochester.  [name]. [name], MD, [facility]    PCN/MedQ  DD:  03/07/2006  DT:  03/08/2006  Job #:  [id number]

## 2010-11-05 NOTE — H&P (Signed)
NAMEILIAN, WESSELL              ACCOUNT NO.:  [id number]   MEDICAL RECORD NO.:  [id number]          PATIENT TYPE:  OIB   LOCATION:  NA                           FACILITY:  [facility]   PHYSICIAN:  Quita Skye. Hart Rochester, M.D.  DATE OF BIRTH:  1941/03/18   DATE OF ADMISSION:  08/19/2004  DATE OF DISCHARGE:                                HISTORY & PHYSICAL   PRIMARY CARE PHYSICIAN:  Donna Bernard, M.D.   CARDIOLOGIST:  Charlton Haws, M.D.   HISTORY OF PRESENT ILLNESS:  This 70 year old man returned to the CV[facility]  office, Dr. Hart Rochester, on July 27, 2004, for evaluation of cerebrovascular  disease. Mr. Terrance underwent a right carotid endarterectomy in December  2005 by Dr. Hart Rochester. Since that time he has had symptoms not related to his  right carotid surgery. These include numbness of his entire face lasting  several days as well as blurred vision and claudication symptoms of his left  arm as well as elevated blood pressure. A recent MRI was negative for acute  CVA and a CT abdomen and pelvis revealed widely patent bilateral renal  artery bypasses. Mr. Oberhaus has known left subclavian artery stenosis that  Dr. Hart Rochester now believes if causing these symptoms. Dr. Hart Rochester has  recommended proceeding with left subclavian PTCA and stenting. This  procedure, risks, and benefits have been discussed with Mr. Peruski and he  agrees to proceed.   PAST MEDICAL HISTORY:  1.  Extracranial cerebrovascular occlusive disease, status post right      carotid endarterectomy on May 24, 2004, by Dr. Hart Rochester.  2.  Abdominal aortic aneurysm and bilateral renal artery stenosis, status      post aortobifemoral bypass and bilateral renal artery bypass in 1994 by      Dr. August Saucer in [place], [place].  3.  Coronary artery disease, status post CABG in 1995 by Dr. Sheliah Plane.  4.  COPD.  5.  Hypothyroidism.  6.  History of renal stones.  7.  Osteoporosis.   OTHER SURGICAL HISTORY:  1.   Partial left knee replacement in April 2004.  2.  Laparoscopic cholecystectomy in October 2002.   ALLERGIES:  The patient has no known drug allergies.   MEDICATIONS PRIOR TO ADMISSION:  1.  Metoprolol 75 mg daily.  2.  Triamterene/HCTZ 37.5/25 mg daily.  3.  Norvasc 5 mg q.h.s.  4.  Levothyroxine 0.125 mg daily.  5.  Niaspan 1000 mg two tablets daily.  6.  Crestor 20 mg daily.  7.  Plavix 75 mg daily. Mr. Edmondson stopped this on his own on February      22nd.  8.  Nexium 40 mg b.i.d.  9.  CoQ10 50 mg two tabs daily.  10. Fish oil 1000 mg daily.   Mr. Bodkin and his wife have been married 45 years. They have three adult  children. He has retired since May 2005. He has a remote smoking history,  quit at 70 years of age. He does not drink alcohol. He lives with his wife  in a single level dwelling.  He drives and his wife will be of assistance  after this procedure.   FAMILY HISTORY:  Significant for his father with coronary artery disease. He  died at 51y years of age. He has a sister with cancer, another sister with  coronary artery disease, and three sisters with diabetes.   REVIEW OF SYSTEMS:  GENERAL: Mr. Groleau has been feeling pretty well  except for head something. He reports feeling excessive blood flow going to  the left side of his head, especially when he leans over. HEENT: He is  hard  of hearing in his right ear. No difficulties chewing. He does have  occasional choking when swallowing. He wears eye glasses and dentures. He  does have some mild dyspnea on exertion and he recovers quickly. He has no  cough. No complaints of chest pain or pedal edema. His weight has been  stable. His appetite is good. He has no recent change in his bowel habits.  He follows a low fat, low salt diet. GU: He does report significant  hesitancy and also reports very clearly that he cannot urinate while he is  lying down.  NEUROLOGIC: No dizziness, syncope, or recent falls. He   ambulates independently. He does not use any assistive devices. No recent  fevers or infections. He does report that his skin is very easily bruised.  No complaints of depression, anxiety, or memory disturbances.   PHYSICAL EXAMINATION:  VITAL SIGNS: Blood pressure on the right 150/80 and  on the left 98/78, heart rate 64 per minute. Height 5 feet 8 inches tall and  he weighs 160 to 165 pounds.  GENERAL: This is a 70 year old Caucasian male in no acute distress. in no acute distress. He is  very pleasant and cooperative with this exam.  HEENT: PERRLA. EOMI. Oral mucosa is pink and moist.  NECK: Full range of motion. He has no carotid bruit on the left and a soft  bruit on the right. No thyromegaly or lymphadenopathy. He has a right neck  incision that is well healed.  LUNGS: His breathing is unlabored. His breath sounds are clear bilaterally.  HEART: Regular rate and rhythm with no murmur. He has a well-healed median  sternotomy incision.  ABDOMEN: Rotund with a well-healed midline incision. He does have a ventral  hernia on the right.  EXTREMITIES: His lower extremities are without edema, varicosities or venous  stasis changes. He has 2+ posterior tibial pulses bilaterally.  NEUROLOGIC: He is alert and oriented. Cranial nerves II-XII are grossly  intact. Gait is steady. He has full range of motion in all extremities.  Muscle strength is 5/5 in all extremities.   Laboratory studies are pending and will include CBC, BMET, PT-INR, and  urinalysis.   IMPRESSION:  This is a 70 year old man with symptomatic left subclavian  artery stenosis.   PLAN:  Admission to [facility] under the care of Dr. Hart Rochester on  August 19, 2004, for an arched aortogram with left subclavian artery PTA and  stenting. Mr. Cowley has been instructed to resume his aspirin and Plavix.     CTK/MEDQ  D:  08/17/2004  T:  08/17/2004  Job:  [id number]

## 2010-11-05 NOTE — H&P (Signed)
[name], [name] [name]              ACCOUNT NO.:  [id number]   MEDICAL RECORD NO.:  [id number]          PATIENT TYPE:  INP   LOCATION:                               FACILITY:  [facility]   PHYSICIAN:  Quita Skye. Hart Rochester, M.D.  DATE OF BIRTH:  1941/03/24   DATE OF ADMISSION:  05/24/2004  DATE OF DISCHARGE:                                HISTORY & PHYSICAL   CHIEF COMPLAINT:  Severe right internal carotid artery stenosis -  asymptomatic.   HISTORY OF PRESENT ILLNESS:  This 70 year old gentleman has a known history  of asymptomatic right carotid occlusive disease with some milder disease on  the left side.  He has been followed by Charlton Haws, M.D. with periodic  Duplex scanning.  Repeat study this year on May 04, 2004, revealed  significant progression of disease to between 80 and 99% in severity on the  right side, an approximate 60 to 70% on the left side.  He denies any  hemispheric or nonhemispheric TIA's, amaurosis fugax, diplopia, blurred  vision, or syncope, and has had no previous stroke.  Is admitted for  elective right carotid endarterectomy.   PAST MEDICAL HISTORY:  Negative for diabetes, hypertension, hyperlipidemia,  COPD, or stroke.  He does have a history of coronary artery disease,  previous coronary artery bypass grafting in 1995.   PAST SURGICAL HISTORY:  Cholecystectomy, abdominal aortic aneurysm  resection, and bilateral renal artery bypass by Dr. Lorenda Cahill in [place], [place].  Left knee replacement, and coronary artery bypass  graft in 2000 by Sheliah Plane, M.D.   FAMILY HISTORY:  Positive for stroke in his mother.  Positive for coronary  artery disease in his father.  Positive for diabetes mellitus in his sister.   SOCIAL HISTORY:  He is married, has three children, and is retired.  He has  not smoked since 1994 and has not used alcohol.   REVIEW OF SYSTEMS:  GENERAL:  Denies any weight loss, loss of appetite,  anorexia.  CARDIAC:  Mild  dyspnea on exertion, but no chest pain,  palpitation, orthopnea, or PND.  Denies any pulmonary symptoms.  Does have  some symptoms of reflux esophagitis with a history of hiatal hernia.  Also  has some urinary frequency and diffuse arthritis and joint pain.   ALLERGIES:  No known drug allergies.   MEDICATIONS:  1.  Plavix 75 mg one daily.  2.  Crestor 20 mg one daily.  3.  Lopressor 50 mg one b.i.d.  4.  Nexium 40 mg one b.i.d.  5.  Synthroid 0.125 mg one daily.  6.  Triamterene/hydrochlorothiazide 37.5/25 one daily.  7.  Niaspan 500 mg two at night.  8.  CoQ-10 50 mg two daily.  9.  Hubriflex 3 at 2 a.m. and 2 p.m.  10. Omega-3 fish oil 1000 mg at 2 p.m.  11. Enteric-coated aspirin one daily.   PHYSICAL EXAMINATION:  VITAL SIGNS:  Blood pressure 110/78 in the left arm,  150/80 in the right arm.  Heart rate 60, respirations 18.  GENERAL:  He is a healthy-appearing male in no  acute distress.  He is alert  and oriented x3.  NECK:  Supple with 3+ carotid pulses.  There is a high pitch bruit on the  right side and a softer bruit on the left.  NEUROLOGY:  Normal.  No palpable adenopathy in the neck.  Upper extremity  pulses are 2 to 3+ at the brachial and radial level bilaterally.  CHEST:  Clear to auscultation.  HEART:  Regular rhythm with no murmurs.  ABDOMEN:  Soft and nontender with no masses.  EXTREMITIES:  3+ femoral, popliteal, and posterior tibial pulses palpable  with no distal edema or evidence of venous disease.   IMPRESSION:  1.  Moderately severe asymptomatic right internal carotid artery stenosis      with moderate left internal carotid artery.  2.  Coronary artery disease, status post coronary artery bypass grafting in      2000, now asymptomatic.   PLAN:  The patient is to be admitted on December 5, for an elective right  carotid endarterectomy.  Risks have been fully discussed by Quita Skye. Hart Rochester,  M.D. to the family in the office and he is ready to  proceed.        ___________________________________________  Quita Skye Hart Rochester, M.D.    JDL/MEDQ  D:  05/11/2004  T:  05/12/2004  Job:  [id number]   cc:   Donna Bernard, M.D.  [address]  [place]  [place] [address]  Fax: [phone]

## 2010-11-05 NOTE — Cardiovascular Report (Signed)
[name], [name]              ACCOUNT NO.:  [id number]   MEDICAL RECORD NO.:  [id number]          PATIENT TYPE:  OIB   LOCATION:  2899                         FACILITY:  [facility]   PHYSICIAN:  [name]. [name], MD, FACCDATE OF BIRTH:  1940-09-16   DATE OF PROCEDURE:  DATE OF DISCHARGE:                            CARDIAC CATHETERIZATION   [name] [name] is a 70 year old vasculopathy, status post previous CABG.  He started having chest, pain study was done to assess graft patency.  Bilious somewhat complicated.  He has had a tube graft repair of an  abdominal aortic aneurysm with renal reimplantation. He has also had  significant and multiple interventions to his left subclavian by Dr.  Hart Rochester. His previous cath in 2001 showed his LIMA to be somewhat atretic  and required brachial approach.   The patient has been having recurrent chest pain and cath was indicated  to rule out graft stenosis.   A 6-French sheath was placed in the right femoral artery without  difficulty.   The patient had somewhat unusual native left main coronary circulation.  We manipulated the JL-4 catheter.  We knew from previous angiograms that  the main artery and LAD were occluded.  We were able to do a flush  injections to document that the circumflex coronary artery was patent.  It primarily consisted of a large OM branch with no significant  stenoses.   The native right coronary artery was occluded in the mid vessel.   We took great care in trying to assess the left subclavian. Wire  exchanges and a slick wire were used. The patient has known ulcerated  plaques in this area. Injection of the left subclavian showed the stents  to be patent with a residual ulcerated plaque coming off medially. The  proximal portion of the LIMA was patent but then was most likely atretic  in the mid vessel as seen previously.   We did not take any other injections of the subclavian or try to  selectively engage LIMA and  it has previously been documented to be  atretic and also did not supplied the LAD.   Using the right coronary catheter and a no tow catheter we were able to  inject the right internal mammary artery.  It was widely patent to the  mid-LAD. The distal LAD native had 20-30% discrete lesions.   The saphenous vein graft to the right coronary artery was widely patent  with retrograde filling of the PLA.   RAO ventriculography showed normal LV function.  EF was 55%.  There was  no grading across the aortic valve and no MR.  The patient also received  10 of hydralazine for blood pressure.  We gave him a sublingual nitro at  the beginning of the case to make sure that his arteries were as dilated  as possible. His LV pressure was 120/18, aortic pressure is 120/66.   Imaging of the distal aorta showed the tube graft to be widely patent  with no residual aneurysms. He did have some left iliac disease.  The  renal arteries were implanted and more widely  patent left and right.  There was an ostial 30% stenosis in the left renal artery but this is  not clinically significant.   IMPRESSION:  Bili is avascular path.  He fortunately has no change in  his anatomy since 2001.  In particular the rim of the LAD is widely  patent, as the vein graft to the PDA and the native circ does not have  critical disease.   His LV function continues to be normal.  His tube graft and renal artery  implants look great.   His biggest issues will be ongoing support of the left subclavian,  stenosis and stent.  I would think that he is at some risk for embolic  events to his left carotid given the ulcerated plaques in his left  subclavian. We will continue on aspirin and Plavix therapy.  Dr. Hart Rochester  is following this closely.  Recently at the end of last year put a  second stent in this area.   Overall, despite the difficulty of the case, the patient tolerated  procedure well and will be discharged later today from  the day hospital  so long as his groin seals well.  SECOND IS 98 ML      Peter C. [name], MD, [facility]  Electronically Signed     PCN/MEDQ  D:  08/28/2006  T:  08/28/2006  Job:  [id number]

## 2010-11-05 NOTE — Consult Note (Signed)
NAMETREVIONNE, ADVANI NO.:  [id number]   MEDICAL RECORD NO.:  [id number]          PATIENT TYPE:  OIB   LOCATION:  2899                         FACILITY:  [facility]   PHYSICIAN:  Lacy Duverney, M.D.     DATE OF BIRTH:  07-24-1940   DATE OF CONSULTATION:  DATE OF DISCHARGE:  08/26/2004                                   CONSULTATION   REASON FOR CONSULTATION:  Request for interpretation of intracranial views  obtained during arteriogram performed by Dr. Hart Rochester on August 26, 2004.  Patient with a history of left subclavian artery narrowing.   Right common carotid arteriogram intracranial views (AP and lateral):  No  significant stenosis seen along the right internal carotid artery or major  branches of such.  No cross filling occurs with right common carotid artery  injection.  No aneurysm detected.   IMPRESSION:  Only right common carotid arteriogram intracranial views were  obtained without significant intracranial stenosis noted.      SO/MEDQ  D:  08/27/2004  T:  08/28/2004  Job:  [id number]

## 2010-11-05 NOTE — Op Note (Signed)
NAMEELDRIDGE, MARCOTT              ACCOUNT NO.:  [id number]   MEDICAL RECORD NO.:  [id number]          PATIENT TYPE:  AMB   LOCATION:  SDS                          FACILITY:  [facility]   PHYSICIAN:  Quita Skye. Hart Rochester, M.D.  DATE OF BIRTH:  05-Jul-1940   DATE OF PROCEDURE:  04/06/2006  DATE OF DISCHARGE:  04/06/2006                                 OPERATIVE REPORT   PREOPERATIVE DIAGNOSIS:  Recurrent stenosis left subclavian artery -  previously stented - with recurrent left arm claudication symptoms.   POSTOPERATIVE DIAGNOSIS:  Not given.   PROCEDURE:  1. Arch aortogram via right common femoral approach.  2. Selective catheterization, left subclavian artery with left subclavian      angiogram.  3. Deployment of new Genesis on OPTA stent (8 mm x 18 mm) in left      subclavian artery distal to previous stent, deployed at 9 atmospheres      for 37 seconds.  4. PTA of in-stent stenosis and previously placed stent using an 8 x 20      Opta catheter inflated at 8 atmospheres for 30 seconds.  5. Completion angiogram left subclavian artery.   SURGEON:  Dr. Hart Rochester.   ANESTHESIA:  Local with Xylocaine.   CONTRAST:  145 mL, heparin 4000 units.   COMPLICATIONS:  None.   DESCRIPTION OF PROCEDURE:  The patient was taken to [facility] Peripheral  Endovascular Lab. The patient was placed in the supine position, right groin  prepped with Betadine solution, draped in a routine sterile manner.  After  infiltration with 1% Xylocaine, the right common femoral artery was entered  percutaneously.  A guidewire passed into the suprarenal aorta under  fluoroscopic guidance and a 6-French sheath and dilator passed over the  guidewire, dilator removed. A long pigtail catheter advanced into the aortic  arch.  Arch aortogram was performed injecting 40 mL of contrast at 30 mL per  second.  This revealed the innominate artery to have moderate irregularity  in its proximal 2-3 cm with a moderate stenosis.  There  was significant  tortuosity at the origin of the right subclavian and right common carotid  and there was evidence of previous patch angioplasty in the proximal right  common carotid artery but the right carotid bifurcation was not visualized.  The left common carotid artery originated from the innominate and had  moderate stenosis proximally as well approximating 60-70% and the left  carotid bifurcation was also not visualized.  There was a stent in the  origin of the left subclavian artery which had an apparent 70% stenosis at  its origin and there was good filling of both vertebral arteries  bilaterally.  The pigtail catheter was exchanged for a Berenstein 1 catheter  and the left subclavian artery was cannulated and a left subclavian  angiogram performed.  This revealed a second stenosis at the distal end of  the previously placed stent with a deep ulcer crater and an approximate 70%  narrowing with good filling of the left vertebral artery.  There appeared to  be a very small and atretic internal mammary  artery which had been used for  coronary artery bypass grafting in the past.  It was then decided to proceed  with insertion of a new stent distal to the previous stent and to dilate the  in-stent stenosis.  Therefore the patient was given 4000 units of heparin.  A Glidewire was advanced into the distal left axillary artery and using the  Berenstein catheter, the Glidewire was exchanged for a Rosen wire and a long  6-French sheath was advanced into the origin of the stented left subclavian  artery.  A second angiogram was performed to localize the lesion precisely  in relation to vertebral artery.  A new stent was then deployed with some  overlap in the distal end of the original stent which extended up to an area  just proximal to the vertebral artery.  An 8 x 18 Genesis on OPTA was  deployed at 9 atmospheres for 37 seconds.  Using the same OPTA catheter (8 x  20).  The in-stent  stenosis was then dilated at 8 atmospheres for 30 seconds  and a completion angiogram revealed widely patent subclavian artery with  rapid filling of all branches.  Having tolerated the procedure well, the  long sheath was exchanged for the short 6-French sheath and when the heparin  had been reversed the sheath was removed, adequate compression applied, no  complications ensued.   FINDINGS:  1. Moderate atherosclerotic changes in innominate artery and proximal left      common carotid artery approximating 50-60% in severity.  2. 70% in-stent stenosis left subclavian artery at its origin and 70%      stenosis at the distal end of the stent in the native subclavian artery      proximal to the vertebral artery.  3. Successful deployment of new left subclavian artery stent (8 x 18)      Genesis on OPTA deployed at 9 atmospheres for 37 seconds.  4. Successful PTA of in-stent stenosis left subclavian artery using an 8 x      20 OPTA catheter inflated at 8 atmospheres for 30 seconds with      completion angiogram of left subclavian artery.           ______________________________  Quita Skye Hart Rochester, M.D.     JDL/MEDQ  D:  04/06/2006  T:  04/07/2006  Job:  [id number]

## 2010-11-05 NOTE — Op Note (Signed)
NAMEMARKIS, LANGLAND NO.:  [id number]   MEDICAL RECORD NO.:  [id number]          PATIENT TYPE:  OIB   LOCATION:  2899                         FACILITY:  [facility]   PHYSICIAN:  Quita Skye. Hart Rochester, M.D.  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   PROCEDURES:  1.  Arch aortogram via right common femoral approach.  2.  Selective right carotid angiogram (biplane with intracerebral views).  3.  Selective left subclavian angiogram.  4.  Percutaneous transluminal angioplasty and stenting of left subclavian      artery with 7 x 24 Genesis on Operating table at 8 atmospheres for 45      seconds with completion angiogram.   SURGEON:  Quita Skye. Hart Rochester, M.D.   ANESTHESIA:  Local Xylocaine.   CONTRAST:  180 mL.   COMPLICATIONS:  None.   DESCRIPTION OF PROCEDURE:  The patient was taken to the [facility]  peripheral endovascular lab, placed in supine position, at which time both  groins were prepped Betadine scrub and solution and draped in a routine  sterile manner.  After infiltration of 1% Xylocaine, , the right common  femoral artery was entered percutaneously, a guidewire passed into the  suprarenal aorta under fluoroscopic guidance.  A 5-French sheath and dilator  were passed over the guidewire, dilator removed, and a standard pigtail  catheter positioned in the aortic arch.  Arch aortogram was then performed  injecting 30 mL of contrast at 20 mL per second.  This revealed the aorta to  have widely patent vessels with a bovine configuration, with the left common  carotid artery originating from the innominate.  Innominate artery was  tortuous and mildly diseased but widely patent.  The right subclavian artery  appeared to be widely patent, and there was a large  right vertebral artery  filling antegrade.  The common carotid artery on the right had had a  previous endarterectomy down to a point about 3 cm distal to its origin, and  the endarterectomized segment was quite large but no evidence of stenosis  was noted.  The left common carotid artery appeared to have a widely patent  lumen, and there was calcification and plaque at the carotid bifurcation  with some mild narrowing, probably less than 40%.  On the left side there  was a severe stenosis at the origin of the left subclavian artery,  approximately 80%, extending up 2-3 cm.  The left vertebral artery filled in  a retrograde fashion, filling the distal left subclavian artery.  The  pigtail catheter was then exchanged for an H1 Headhunter catheter, in the  right common carotid artery was selectively cannulated and right carotid  (biplane) angiogram was performed with intracerebral views.  This revealed  the carotid bifurcation to be widely patent.  As noted, the common carotid  was quite large due to the patch angioplasty, and there was no narrowing in  either the internal or external carotid artery on the right side.  Following this, the left subclavian artery was cannulated using a #1  Berenstein catheter and a Glidewire advanced into the right subclavian  artery distally.  The catheter was then advanced and exchanged for a long  Rosen wire.  Following this, the patient was given 4000 units of heparin and  the 5-French sheath exchanged for a long 6-French sheath, which was passed  up to the origin of the left subclavian artery.  A left subclavian angiogram was then performed through the sheath via hand  injection of contrast, which revealed the lesion to be about 2 cm in length  and about 80% stenotic.  The vertebral artery with originated about 3 cm  distal to the lesion and was quite large and filled retrograde.  There was  no significant narrowing in the distal subclavian or axillary  artery, and  the internal mammary artery was not visualized.  A 7 x 24 mm Genesis On Opta  stent was selected.  The sheath was advanced past the lesion and the  angioplasty catheter positioned appropriately using Glow and [place] marker.  The sheath was withdrawn and one inflation of 8 atmospheres for 45 seconds  was performed with a completion angiogram revealing an excellent cosmetic  result of the stenotic lesion.  The Rosen wire was then carefully removed  through the sheath and dilator and then the long sheath was exchanged for a  short 5-French sheath after correction of the ACT.  The sheath was removed,  adequate compression applied.  No complications ensued.   FINDINGS:  1.  Bovine aortic arch with left common carotid originating from innominate      artery.  2.  Widely patent right carotid endarterectomy site.  3.  Large bilateral vertebral arteries with retrograde flow on the left.  4.  Mild carotid occlusive disease, left carotid bifurcation, with      approximate 40% stenosis.  5.  Eight percent proximal left subclavian artery stenosis with retrograde      flow, left vertebral artery.  6.  Successful percutaneous transluminal angioplasty and stenting of left      subclavian stenosis using 7 x 24 Genesis On Opta system at 8 atmospheres      for 45 seconds with completion angiogram.      JDL/MEDQ  D:  08/26/2004  T:  08/26/2004  Job:  [id number]

## 2010-11-05 NOTE — Op Note (Signed)
NAME:  [name] [name], [name] [name]                        ACCOUNT NO.:  [id number]   MEDICAL RECORD NO.:  [id number]                   PATIENT TYPE:  OIB   LOCATION:  2899                                 FACILITY:  [facility]   PHYSICIAN:  Mark C. [name], M.D.                 DATE OF BIRTH:  November 23, 1940   DATE OF PROCEDURE:  10/18/2002  DATE OF DISCHARGE:                                 OPERATIVE REPORT   PREOPERATIVE DIAGNOSIS:  Left knee medial compartment osteoarthritis.   POSTOPERATIVE DIAGNOSIS:  Left knee medial compartment osteoarthritis.   PROCEDURES:  Left knee partial patellar resection, medial compartment  hemiarthroplasty.   SURGEON:  Mark C. [name], M.D.   ASSISTANT:  Genene Churn. Denton Meek.   ANESTHESIA:  GOT.   TOURNIQUET TIME:  1 hour 10 minutes.   ESTIMATED BLOOD LOSS:  Minimal.   COMPONENTS:  EIUS Stryker small medial femur and medial tibia, 8 mm.   DESCRIPTION OF PROCEDURE:  After the preoperative femoral nerve block,  preoperative Ancef prophylaxis, induction of general anesthesia, a roll of  tape placed in the groin over the femoral head.  A mid-thigh tourniquet with  a left holder applied, foot of the bed was removed, and well leg holder was  used for the right leg.  The leg was shaved for the operative area, prepped  with Duraprep, impervious stockinette, Coban, and U-sheets, drapes, sterile  skin marker, Betadine Vi-Drape applied with sitting surgeon and drapes  pulled around behind.  The leg was wrapped in an Esmarch, the tourniquet was  inflated to 350.  A four-inch incision was made down through the capsule.  The medial meniscus was resected and oscillating saw was used to remove the  medial portion of the patella, which had a significant spur placed.  Minimal  resection was made on the tibia, and the area of raw exposed subchondral  bone was visualized with sloping of the condyle.  A 9 mm spacer block fit  well and femoral guide was applied, inserted, long external  rod used,  pointing directly over the roll of tape, and spurs were removed off the  medial femoral condyle, which were overhanging osteophytes, and the femoral  small guide was placed slightly closer to the notch than to the medial edge  of the medial femoral condyle.  Pins were inserted, a bur was used, and then  free-handed bur appropriate depth.  Trial was inserted, impacted until it  was flush with the condyle.  An 8 mm tibial spacer block was appropriate  sized.  A second cut had to be made and the spine of the tibia removed with  2 mm more of bone in the middle of the tibia so that there would be no  overhang in the region of the medial collateral ligament.  This gave total  bone contact all around the prosthesis.  A template was used drilling and  keel  punch.  Trials were inserted and gave excellent fit after resection of  the medial meniscus.  A three-quarter straight osteotome was used to remove  the posterior spur at the back of the medial femoral condyle.  This bone  piece was removed.  Pulsatile lavage was used.  The permanent prostheses  were opened.  The tibia was inserted and the keel hole was deepened slightly  to make sure that the tibial component sat down flush and was completely  covered by bone.  Cement was vacuum-mixed, placed on the tibial component as  well as on the bone, pushed into the keel hole and along the tibial spine  vertical wall.  When it was inserted, a sponge had been placed, which was a  Ray-Tec, twisted and damp and placed posteriorly to prevent any posterior  cement migration and once the excessive cement was removed with the Freer,  the sponge was removed.  The femoral component was covered with cement.  The  cement was pushed into the hole and the femoral component was impacted into  place, and excessive cement was removed.  Pulsatile lavage and a tongue  depressor shoved in between the femoral and tibial component holding  compression with the knee  at 45 degrees.  The front of the femoral component  was exactly at the tide mark.  A small spur was trimmed off the superior  portion of the medial femoral condyle.  All cement was held until hard, and  a pain pump with 50 mL of 0.5% Marcaine was inserted, 20 mL placed in the  knee, 5 mL in the skin incision.  Deep retinaculum closed with 0 Vicryl, 2-0  Vicryl in the subcutaneous tissue, skin staple closure, Marcaine in the  skin, a postop dressing with knee immobilizer.  Instrument count, needle  count was correct. The patient was transferred to the recovery room, will be  discharged later today, and was in stable condition.                                               Mark C. [name], M.D.    MCY/MEDQ  D:  10/18/2002  T:  10/18/2002  Job:  [id number]

## 2010-11-05 NOTE — Op Note (Signed)
NAMEJASON, [name] [name] NO.:  [id number]   MEDICAL RECORD NO.:  [id number]          PATIENT TYPE:  INP   LOCATION:  2899                         FACILITY:  [facility]   PHYSICIAN:  Quita Skye. Hart Rochester, M.D.  DATE OF BIRTH:  Dec 19, 1940   DATE OF PROCEDURE:  05/24/2004  DATE OF DISCHARGE:                                 OPERATIVE REPORT   PREOPERATIVE DIAGNOSIS:  Severe right internal carotid stenosis -  asymptomatic.   POSTOPERATIVE DIAGNOSIS:  Severe right internal carotid stenosis -  asymptomatic.   OPERATION:  Right carotid endarterectomy with Dacron patch angioplasty.   SURGEON:  Quita Skye. Hart Rochester, M.D.   ASSISTANT:  Coral Ceo, P.A.   ANESTHESIA:  General endotracheal.   INDICATIONS FOR PROCEDURE:  This patient has been followed with carotid  occlusive disease with duplex scanning and recently was seen by Dr. Charlton Haws, and a duplex scan performed at [facility],  revealing significant progression of right carotid occlusive disease to  greater than 80%.  He remains asymptomatic with no hemispheric or non-  hemispheric TIA's.  He has a moderate stenosis on the contralateral left  side.  He is scheduled for a right carotid endarterectomy.   DESCRIPTION OF PROCEDURE:  The patient is taken to the operating room and  placed in the supine position, at which time the satisfactory general  endotracheal anesthesia was administered.  The right neck was prepped with  Betadine scrub and solution, and draped in a routine sterile manner.  An  incision was made along the anterior border of the sternocleidomastoid  muscle and carried down through the subcutaneous tissue and platysma using  the Bovie.  The common facial vein and the external jugular veins were  ligated with #3-0 silk ties and divided, exposing the common internal and  external carotid arteries.  Care was taken not to injure the vagus or the  hypoglossal nerves, both of which were exposed.   There was a calcified  plaque at the carotid bifurcation, extending up posteriorly about 3 to 4 cm.  This extended down the common carotid artery 2 to 3 cm, and then there was a  skip area and a second plaque more proximally in the common carotid artery.  This necessitated a long dissection in the common carotid artery down below  the crossing of the omohyoid muscle.  A #10 shunt was prepared and the  patient was heparinized.  The carotid vessels were occluded with vascular  clamps.  A longitudinal opening was made in the common carotid with the #15  blade and extended up to the internal carotid with the Pott scissors, to a  point distal to the disease.  The plaque was severely ulcerative in nature,  and about 80% at the bifurcation in severity.  A #10 shunt was inserted  without difficulty, re-establishing flow in about two minutes.  A standard  endarterectomy was then performed using the elevator and the Pott scissors,  with an eversion endarterectomy of the external carotid.  The plaque  feathered off of the distal internal carotid artery nicely, not requiring  any tacking sutures.  The lumen was thoroughly irrigated with heparin and  saline.  All loose debris was carefully removed, and the arteriotomy was  closed with a long patch, using continuous #6-0 Prolene.  Prior to  completion of the closure, the shunt was removed after about 50  minutes of  shunt time.  Following antegrade and retrograde flushing, the closure was  completed, with the re-establishment of flow initially up the external and  the internal branch.  The carotid was occluded for less than two minutes,  for a removal of the shunt.  Protamine was then given to reverse the  heparin.  Following adequate  hemostasis, the wound was irrigated with saline and closed in layers with  Vicryl in the subcuticular fashion.  Sterile dressing was applied.  The patient was taken to the recovery room in satisfactory  condition.       JDL/MEDQ  D:  05/24/2004  T:  05/24/2004  Job:  [id number]

## 2010-11-05 NOTE — Discharge Summary (Signed)
[place]  Patient:    [name] [name], [name] [name] Visit Number: [id number] MRN: [id number]          Service Type: MED Location: [phone] Attending Physician:  Colon Branch Dictated by:   [name] [name], P.A. Admit Date:  03/02/2001 Disc. Date: 03/05/01   CC:         [name] [name], M.D.  [name] [name]. [name] [name]., M.D. [facility]  Sandria Bales. [name] [name], M.D.   Discharge Summary  DISCHARGE DIAGNOSES: 1. Chest pain, status post Cardiolite stress test. 2. History of abdominal aortic aneurysm repair in 1995. 3. History of bypass [name] in 1995. 4. History of renal artery bypasses. 5. History of nephrolithiasis. 6. Chronic obstructive pulmonary disease. 7. Hypothyroidism. 8. History of dysphagia, recent esophageal dilatation. 9. Cholecystitis.  HOSPITAL COURSE:  [name] [name] is a pleasant, 70 year old male who presented to the emergency room on September 13, complaining of chest pain along with decreased abdominal distention.  He was seen and admitted by Dr. Charlton [name]. Dr. Eden [name] felt that he doubted that the pain was of cardiac origin; however, given the patients history, planned for a nuclear stress test the following day.  He also felt the patients primary complaints were gastroenterological in nature and ordered a gallbladder ultrasound as well as a KUB x-ray.  Later that morning, the patient was seen by Dr. Loreta [name] from GI service.  She felt that the previously ordered ultrasound and KUB studies were appropriate and awaited their results.  She also agreed on continuing the patient on a proton pump inhibitor and a clear liquid diet for the time being.  On September 14, the patient underwent an adenosine Cardiolite exam.  Nuclear imaging revealed ejection fraction of 57%.  There was questionable area of ischemia in the anterolateral wall, however, this was noted to be a chronic problem.  The patient stated he had the finding in the past followed  by subsequent normal catheterization.  The patient was then seen again by Dr. Loreta [name].  She noted that the patient had no further problems with abdominal discomfort, nausea or vomiting.  The ultrasound showed gallstones with questionably thickened gallbladder wall.  As a result, she recommended surgical consultation.  The patient was then seen by Dr. Ovidio [name] of [name] [name].  He felt that the patients gallbladder [name] could be done on an elective basis.  He also planned to attempt a laparoscopic procedure.  However, given the patients prior history of abdominal [name], an open procedure might be necessary.  On September 15, the patient was seen by Dr. Juanito [name].  He noted the patient had some abdominal and chest discomfort after eating the prior evening. Otherwise, he felt that the patient was doing well and may be ready for discharge the following day.  On September 16, the patient was again seen by Dr. Ezzard [name].  He felt that the patient, from a surgical standpoint, could be discharged home with followup in the office with scheduling of elective surgical procedure.  The patient was then seen by Dr. Eden [name].  The patient was pain free at that time.  After reviewing the patients Cardiolite, he felt that it was consistent with prior studies and that no catheterization is needed.  He felt that the patient was stable for discharge.  DISCHARGE MEDICATIONS: 1. Lopressor 50 mg b.i.d. 2. Synthroid 0.125 mg q.d. 3. Enteric coated aspirin 325 mg q.d. 4. Prilosec 20 mg q.d. 5. Plavix 75 mg q.d. 6. Zocor. 7. Dyazide. 8. Coenzyme  Q-10. 9. Fish oil as previously taken.  ACTIVITY:  Return to normal level of activity.  He may return to work.  DIET:  Low fat diet.  FOLLOWUP:  Follow up with Dr. Ezzard [name] in the office on September 23, at 10:35 a.m.  Note, this may need to be changed due to the patients work scheduled.  He is to follow up with Dr. Eden [name] and call him in late  October for an appointment in December.  Follow up with Dr. Marina [name] as needed or scheduled.  Follow up with Dr. Gerda [name] as needed or scheduled.  DISCHARGE LABORATORY DATA AND X-RAY FINDINGS:  Sodium 138, potassium 3.9, chloride 107, CO2 24, BUN 14, creatinine 0.8, glucose 97.  Total protein 6.5, albumin 3.7, SGOT 22, SGPT 18, Alk phos 61, total bilirubin 0.7.  White count 7.6, hemoglobin 16.5, hematocrit 46.5, platelets 153.  Chest x-ray showed no acute disease with changes consistent with prior CABG [name]. Electrocardiogram showed normal sinus rhythm at 63.  PR interval at 175, QRS 0.88, QTC 439, axis of 80. Dictated by:   [name] [name], P.A. Attending Physician:  Colon Branch DD:  03/05/01 TD:  03/05/01 Job: [id number]

## 2010-11-05 NOTE — Procedures (Signed)
   NAME:  [name] [name], [name] [name]                        ACCOUNT NO.:  [id number]   MEDICAL RECORD NO.:  [id number]                   PATIENT TYPE:  OUT   LOCATION:  RAD                                  FACILITY:  [facility]   PHYSICIAN:  Madolyn Frieze. Jens Som, M.D. [facility]         DATE OF BIRTH:  02-18-1941   DATE OF PROCEDURE:  03/22/2002  DATE OF DISCHARGE:                                    STRESS TEST   INDICATIONS:  The patient is a 70 year old male with history of coronary  artery disease status post CABG 1995, peripheral vascular disease status  post AAA repair 1995, and dyslipidemia who presents with recurring chest  pain.   TEST:  The patient underwent Adenosine infusion as per protocol.  Cardiolite  injected at three minute mark.   The patient noted mild anterior chest tightness during Adenosine infusion.  This resolved spontaneously in recovery.   Serial EKG tracings revealed no significant ST abnormalities or atrial  ventricular block.   Heart rate rose from 51 baseline to 81 maximum, blood pressure 136/88  baseline to 118/64 final.   CONCLUSION:  Negative Adenosine stress test.  Cardiolite images pending.     Gene Serpe, P.A. [facility]                      Madolyn Frieze. Jens Som, M.D. [facility]    GS/MEDQ  D:  03/22/2002  T:  03/25/2002  Job:  [id number]

## 2010-11-05 NOTE — Op Note (Signed)
[place]. [facility]  Patient:    [name] [name], [name] [name] Visit Number: [id number] MRN: [id number]          Service Type: Attending:  Luisa Hart L. Lurene Shadow, M.D. Dictated by:   Mardene Celeste. Lurene Shadow, M.D. Proc. Date: 04/20/01                             Operative Report  PREOPERATIVE DIAGNOSIS:  Chronic calculous cholecystitis.  POSTOPERATIVE DIAGNOSIS:  Chronic calculous cholecystitis.  PROCEDURE:  Laparoscopic cholecystectomy with intraoperative cholangiogram.  SURGEON:  Luisa Hart L. Lurene Shadow, M.D.  ASSISTANT: Marnee Spring. Wiliam Ke, M.D.  ANESTHESIA:  General.  CLINICAL NOTE:  This patient is a 70 year old man who is status post abdominal aortic aneurysmectomy in the past, who presents with upper abdominal pain associated with nausea and emesis, on evaluation noted to have cholelithiasis. Liver function studies are within normal limits.  He had no hyperamylasemia, did not have any fever, chills, or jaundice.  His cardiovascular status has been stabilized, and he comes to the operating room now for laparoscopic cholecystectomy after the risks and benefits of surgery have been discussed in detail and all questions answered.  He understands also that the possibility of open cholecystectomy may have to be performed due to his previous abdominal surgery.  DESCRIPTION OF PROCEDURE:  Following the induction of satisfactory general anesthesia with the patient positioned supinely, the patients abdomen was routinely prepped and draped to be included in the sterile operative field.  I made a curvilinear incision above the umbilicus, deepened this through the skin and subcutaneous tissue down to the linea alba, which was opened through a small incision transversely.  I was able to enter the abdomen in a clean plane and dissected the omental adhesions from the trocar site.  The trocar was then inserted into the abdomen and the abdomen insufflated to 14 mmHg pressure using carbon  dioxide.  I inserted the camera.  This placed [place] in a position in the retrocolic area.  We then slowly dissected the area, a thin area through the omentum to enter the peritoneum proper.  Visual exploration showed the gallbladder to be chronically scarred, the liver edges sharp, the liver surfaces smooth, anterior gastric wall appeared to be normal.  None of the large or small intestine that was visualized through this limited incision appeared to be abnormal.  Under direct vision, epigastric and lateral ports were placed.  The gallbladder was grasped and retracted cephalad and dissection carried down to the region of the ampulla, where the cystic artery and cystic duct were positively identified, tracing the cystic artery up to its entry into the gallbladder wall and the cystic duct up to the cystic duct- gallbladder junction.  The cystic artery was doubly clipped and transected. The cystic duct was clipped proximally and opened, and a cholangiogram was performed by placing a 14-gauge Angiocath into the abdomen, through which I passed a Reddick catheter and injected one-half strength Hypaque dye into the biliary system.  The resulting cholangiogram appeared to be normal with no filling defects, normal caliber, and free flow of contrast into the duodenum. The cholangiocatheter was removed, the cystic duct was doubly clipped and transected, and the gallbladder was dissected free from the liver bed using electrocautery.  At the end of the dissection, all areas were checked for hemostasis and noted to be dry.  The gallbladder was extracted using an Endocatch through the epigastric wound.  The peritoneum was then thoroughly irrigated  with normal saline and aspirated.  The trocars were removed under direct vision and the pneumoperitoneum allowed to deflate.  All wounds were then closed, the umbilical wound in two layers with 0 Dexon and 4-0 Dexon, epigastric and lateral flank wounds closed with  4-0 Dexon sutures.  The wound was reinforced with Steri-Strips, sterile dressings applied.  Anesthetic reversed, the patient removed from the operating room to the recovery room in stable condition.  He tolerated the procedure well. Dictated by:   Mardene Celeste. Lurene Shadow, M.D. Attending:  Mardene Celeste. Lurene Shadow, M.D. DD:  04/20/01 TD:  04/21/01 Job: [id number]

## 2010-11-05 NOTE — Consult Note (Signed)
[name] [name]. [facility]  Patient:    [name] [name], [name] [name] Visit Number: [id number] MRN: [id number]          Service Type: MED Location: [id number] [id number] Attending Physician:  Colon Branch Dictated by:   Sandria Bales. Ezzard Standing, M.D. Proc. Date: 03/03/01 Admit Date:  03/02/2001   CC:         Anselmo Rod, M.D.  Loran Senters, M.D.  [name]. [name], M.D. [facility]  Daisey Must, M.D. [facility]  John N. Eda Keys., M.D. [facility]   Consultation Report  DATE OF BIRTH:  1941-01-10  REASON FOR CONSULTATION:  Gallstones, gallbladder disease.  HISTORY OF PRESENT ILLNESS:  The patient is a 70 year old white male patient of Dr. Loran Senters who presented to the [facility] Emergency Room on the evening of 03/02/01 after several bouts over three or four days of bilateral epigastric and bilateral chest pain.  Dr. Loreta Ave was consulted for a possible GI source of his abdominal pain.  An ultrasound was obtained which showed gallstones with questionably thickened gallbladder wall.  The patient has a significant history that just on February 16, 2001 Dr. Marina Goodell had done an upper endoscopy and saw some narrowing of the distal esophagus which apparently dilated and he has a hiatal hernia but not much mention of reflux according to the patient.  He is actually scheduled for a colonoscopy by Dr. Marina Goodell in October 2002.  He denies any known history of peptic ulcer disease, liver disease, pancreatic disease.  He has had no prior colonic disease.  He did have an abdominal operation for an aortic bypass with bilateral renal artery bypass in 1994 by Dr. August Saucer at [facility].  That is his only abdominal operation.  PAST MEDICAL HISTORY: 1. He has no allergies. 2. Admission medicines included Lopressor b.i.d., Synthroid, Plavix, aspirin    daily, Zocor, coenzyme, Prilosec, Dyazide, and he takes over-the-counter    fish oil.  REVIEW OF SYSTEMS:  Pulmonary: He quit smoking with his  first heart disease so that was in 1994.  He has no known lung disease.  Cardiac: He has had a coronary artery bypass 1995 by Dr. Sheliah Plane.  His last catheterization was April 2001 and then he actually had a Cardiolite scan today which showed some anterior wall ischemia in which they need to compare with prior studies.  He is followed by Dr. Charlton Haws.  Gastrointestinal: See history of present illness.  Urologic: He has had the bilateral renal artery transplant but no history of kidney stones or kidney infections.  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  PHYSICAL EXAMINATION:  VITAL SIGNS:  Temperature 97.3, blood pressure 155/80.  GENERAL:  He is a well-nourished, pleasant, white male alert and cooperative on physical exam.  HEENT:  Unremarkable.  NECK:  Supple without mass, without thyromegaly.  LUNGS:  Clear to auscultation.  HEART:  Regular rate and rhythm.  He has a well-healed median sternotomy scar.  ABDOMEN:  Well-healed midabdominal scar.  He does have some abdominal wall hernia at least one which may be 2 or 3 cm in the upper mid portion of his belly.  He has got no guarding, no rebound, no tenderness, and actually has no abdominal symptomatology at all right now.  LABORATORIES:  Sodium 139, potassium 3.7, chloride 106, CO2 of 26, his creatinine 0.9, his alkaline phos is  81, his bilirubin is 0.8.  His CK total was 68 with an MB fraction of 1.39 ng/ml.  His troponin was in the normal range.  His white blood count 6100, hemoglobin 13.9, hematocrit 38.4, his platelet count is 197,000.  I reviewed his ultrasound with ___________ which shows multiple gallstones.  I think the call on the gallbladder wall may be a little bit of an overcall.  I am not real impressed that he has got real evidence of acute cholecystitis by ultrasound.  IMPRESSION:  1. Gallstones on ultrasound  with symptoms of epigastric discomfort and    bloating consistent with possible recurrent biliary disease.  I think the    patient will eventually need his gallbladder out but I certainly think he    needs clearance with his heart first and then his gallbladder surgery can    be done at a later elective date.  I discussed with him about what the    gallbladder surgery involved.  Certainly with his prior upper abdominal    incision he is at high risk for open surgery.  Actually his daughter-in-law    had open gallbladder surgery so the family is a little bit aware of this.    This was done by Dr. Luan Pulling. 2. Abdominal wall hernia which has been stable according to the patient.  I    would do nothing further at this time but certainly not repair this same    day I did the gallbladder surgery. 3. Hiatal hernia with probable gastroesophageal reflux disease followed by    Dr. Yancey Flemings. 4. Planned colonoscopy which I agree with. 5. Known coronary artery disease with a cardiac cath which was okay in    April 2001, now with an abnormal Cardiolite study.  I spoke with    Dr. Gerri Spore by phone and they are going to plan to compare these studies    to tell whether there is any further cardiac evaluation needs to be done. 6. History of renal artery stenosis with bypass procedure. 7. Thyroid replacement. Dictated by:   Sandria Bales. Ezzard Standing, M.D. Attending Physician:  Colon Branch DD:  03/03/01 TD:  03/03/01 Job: [phone] [id number]

## 2010-11-05 NOTE — Discharge Summary (Signed)
[name] [name], [name] [name]              ACCOUNT NO.:  [id number]   MEDICAL RECORD NO.:  [id number]          PATIENT TYPE:  INP   LOCATION:  3313                         FACILITY:  [facility]   PHYSICIAN:  Quita Skye. Hart Rochester, M.D.  DATE OF BIRTH:  12-Mar-1941   DATE OF ADMISSION:  05/24/2004  DATE OF DISCHARGE:  05/25/2004                                 DISCHARGE SUMMARY   ADMITTING DIAGNOSES:  Severe asymptomatic right internal carotid artery  stenosis.   PAST MEDICAL HISTORY:  1.  Coronary artery disease status post CABG 1995.  2.  Abdominal aortic aneurysm status post resection with bilateral renal      artery bypass by Dr. Lorenda Cahill, [place], [place].   PAST SURGICAL HISTORY:  1.  Cholecystectomy.  2.  Left knee replacement.   ALLERGIES:  Patient has no known drug allergies, although he is sensitive to  LIPITOR.   DISCHARGE DIAGNOSES:  Severe asymptomatic right internal carotid artery  stenosis status post right carotid endarterectomy.   BRIEF HISTORY:  Mr. Amescua is a 70 year old Caucasian man.  He had no  history of asymptomatic right carotid occlusive disease with milder disease  on the left.  He has been followed by Dr. Charlton Haws with periodic duplex  scanning.  Repeat study May 04, 2004 revealed significant progression  of his disease to between 80-99% in severity on the right side.  He was  referred to Dr. Hart Rochester for consideration of surgical intervention.  He was  evaluated at [facility] office.  After examination of patient and review of all  available records, Dr. Hart Rochester recommended proceeding with an elective right  carotid endarterectomy for stroke risk reduction.  The procedure risks and  benefits were all discussed with Mr. Oyster and he agreed to proceed with  surgery.   HOSPITAL COURSE:  On May 24, 2004 Mr. Mounts was electively admitted  to [facility] under the care of Dr. Nigel Berthold. Hart Rochester.  He underwent the  following surgical  procedure:  Right carotid endarterectomy with Dacron  patch angioplasty.  He tolerated this procedure well, transferring in stable  condition to the PACU.  He was extubated immediately following surgery and  awoke from anesthesia neurologically intact.  He remained hemodynamically  stable in the immediate postoperative period.   Mr. Chaires was making good progress in recovering from his surgery.  May 25, 2004 on morning rounds postoperative day one he reports feeling  very well.  His vital signs are stable.  Blood pressure 90/45.  He is  afebrile.  His room air saturation is 91%.  His heart is maintaining a  normal sinus rhythm.  His lungs are clear to auscultation.  He has some mild  nausea this morning, no vomiting.  He has normoactive bowel sounds.  His  Foley catheter has been removed and he has been able to void.  His right  neck incision is clean and dry.  There is minimal swelling.  He remains  neurologically intact.  He has been ambulating independently.  His pain is  adequately controlled.  Mr. Bonelli looks very good postoperative day one.  He is ready for discharge home this morning as long as he can tolerate his  breakfast.   LABORATORIES:  December 6:  CBC:  White blood cells 8.4, hemoglobin 12.5,  hematocrit 35, platelets 193.  Chemistries include sodium 134, potassium  3.7, BUN 10, creatinine 0.8, glucose 140.   CONDITION ON DISCHARGE:  Improved.   DISCHARGE INSTRUCTIONS:   MEDICATIONS:  1.  He is to resume his home medicines of Plavix 75 mg daily.  2.  Crestor 20 mg daily.  3.  Lopressor 50 mg b.i.d.  4.  Nexium 40 mg b.i.d.  5.  Synthroid 0.125 mg daily.  6.  Triamterene/hydrochlorothiazide 37.5/25 mg daily.  7.  Niaspan 1000 mg q.h.s.  8.  Co-Q-10 100 mg daily.  9.  Hubriflex 3 at 2 a.m. and 2 p.m.  10. Omega 3 fish oil 1000 mg q.p.m.  11. Enteric-coated aspirin daily.   ACTIVITY:  He has been asked to refrain from driving or heavy lifting for  the  next two weeks.  Also instructed to continue his breathing exercises and  daily walking.   PAIN MANAGEMENT:  He may have Tylox one to two p.o. q.4h. p.r.n.   DIET:  Continue to be a heart healthy diet.   WOUND CARE:  He may begin showering on Wednesday, December 7.   FOLLOWUP:  Dr. Hart Rochester would like to see him back at the CVTS office on  Tuesday, June 22, 2004 at 10:40 a.m.       CTK/MEDQ  D:  05/25/2004  T:  05/26/2004  Job:  [id number]   cc:   Charlton Haws, M.D.

## 2010-11-05 NOTE — Consult Note (Signed)
. [facility]  Patient:    [name] [name], [name] [name] Visit Number: [id number] MRN: [id number]          Service Type: MED Location: 3700 3705 [id number] Attending Physician:  Colon Branch Dictated by:   Anselmo Rod, M.D. Proc. Date: 03/02/01 Admit Date:  03/02/2001   CC:         Loran Senters, M.D.  Wilhemina Bonito. Eda Keys., M.D. [facility]  Theron Arista C. [name], M.D. [facility]   Consultation Report  CHIEF COMPLAINT:  Abdominal pain and bloating x 2 days.  ASSESSMENT: 1. Diffuse abdominal pain with abdominal bloating of unclear etiology. 2. History of dysphagia without evidence of esophageal stricture on EGD. 3. History of hypothyroidism on Synthroid. 4. Long-standing tobacco abuse/COPD. 5. Peripheral vascular disease. 6. History of heart disease, status post CABG in 1995. 7. History of aortobifemoral bypass grafting. 8. History of bilateral renal artery bypass. 9. History of nephrolithiasis.  RECOMMENDATIONS: 1. Will await abdominal ultrasound results in the a.m. 2. Agree with PPI. 3. Pure liquid diet.  NPO after midnight.  HISTORY OF PRESENT ILLNESS:  [name] [name] is a very pleasant, 70 year old, white male who presents to the emergency room after being referred here by Dr. Simone [name] in [place].  The patient describes his discomfort is diffuse and throughout the abdomen radiating to his chest and neck.  He denies any shortness of breath.  There is no history of nausea, vomiting, fevers, chills or rigors.  He denies any history of bright red blood per rectum or melena. His appetite has been good and his weight has been stable.  There is no history of ulcer, jaundice or colitis.  He had four loose bowel movements yesterday after initial episode of abdominal pain, but has had no bowel movements today.  There is no history of genitourinary problems.  ALLERGIES:  No known drug allergies.  MEDICATIONS: 1. Prilosec. 2. Aspirin. 3. Plavix. 4.  Synthroid. 5. Triamterene/HCTZ. 6. Metoprolol.  SOCIAL HISTORY:  The patient is married and lives with his wife in [place]. He is a father of three.  He works for a [employer] account executive.  He has a long-standing history of tobacco use and smoked two packs per day for over 30 years.  He has not smoked since 1994.  He denies the use of alcohol or street drugs.  FAMILY HISTORY:  The patients father died at 21 of heart disease.  Mother died at 41 of heart disease.  His brother died of a stroke at 58 and sister who died at 65 of complications of MS.  There is no known family history of colon cancer.  REVIEW OF SYSTEMS:  Diffuse abdominal bloating and distention.  Some chest and neck discomfort.  PHYSICAL EXAMINATION:  GENERAL:  This is a very pleasant, elderly, white male in no acute distress.  VITAL SIGNS:  Temperature 97.7, blood pressure 174/69, pulse 74, respiratory rate 20.  HEENT:  PERRLA.  Dentures in the upper and lower jaw.  Mucous membranes without exudate.  NECK:  Supple.  CHEST:  Clear to auscultation.  HEART:  Ejection murmur present.  Well-healed surgical scar present in the midline from previous CABG.  ABDOMEN:  Soft with a well-healed surgical scar in the mid abdomen from previous mentioned surgeries.  Nontender, nondistended with normal bowel sounds.  No evidence of hepatosplenomegaly, no masses palpable.  RECTAL:  Guaiac negative brown stools.  Prostate seems slightly enlarged.  No other masses are palpable.  LABORATORY DATA AND  X-RAY FINDINGS:  Hemoglobin 13.9, hematocrit 38.4, white count 6.1, platelets 197; 80% neutrophils.  Sodium 139, potassium 3.7, chloride 106, CO2 26, glucose 91, BUN 21, creatinine 0.9, calcium 9.4.  Total protein 6.8, albumin 3.7, AST 32, ALT 36, Alk phos 81, total bilirubin 0.8. CK-MBs within normal limits.  Troponin was 0.02 ng/ml.  EKG showed normal sinus rhythm.  Considering this data, above recommendations have  been made.  I will continue to follow the patient with the cardiology service and make further recommendations as need arises. Dictated by:   Anselmo Rod, M.D.  Attending Physician:  Colon Branch DD:  03/02/01 TD:  03/03/01 Job: [id number]

## 2010-11-17 ENCOUNTER — Other Ambulatory Visit (HOSPITAL_COMMUNITY): Payer: Self-pay | Admitting: Cardiovascular Disease

## 2010-11-17 DIAGNOSIS — I2510 Atherosclerotic heart disease of native coronary artery without angina pectoris: Secondary | ICD-10-CM

## 2010-11-18 ENCOUNTER — Encounter: Payer: Self-pay | Admitting: Cardiovascular Disease

## 2010-11-18 ENCOUNTER — Encounter: Payer: Self-pay | Admitting: *Deleted

## 2010-11-19 ENCOUNTER — Ambulatory Visit (HOSPITAL_COMMUNITY): Payer: Medicare Other | Attending: Cardiovascular Disease | Admitting: Radiology

## 2010-11-19 ENCOUNTER — Encounter: Payer: Self-pay | Admitting: Cardiovascular Disease

## 2010-11-19 ENCOUNTER — Ambulatory Visit (INDEPENDENT_AMBULATORY_CARE_PROVIDER_SITE_OTHER): Payer: Medicare Other | Admitting: Cardiovascular Disease

## 2010-11-19 DIAGNOSIS — I379 Nonrheumatic pulmonary valve disorder, unspecified: Secondary | ICD-10-CM | POA: Insufficient documentation

## 2010-11-19 DIAGNOSIS — I10 Essential (primary) hypertension: Secondary | ICD-10-CM

## 2010-11-19 DIAGNOSIS — I2510 Atherosclerotic heart disease of native coronary artery without angina pectoris: Secondary | ICD-10-CM

## 2010-11-19 DIAGNOSIS — E7800 Pure hypercholesterolemia, unspecified: Secondary | ICD-10-CM

## 2010-11-19 DIAGNOSIS — M1710 Unilateral primary osteoarthritis, unspecified knee: Secondary | ICD-10-CM

## 2010-11-19 DIAGNOSIS — I719 Aortic aneurysm of unspecified site, without rupture: Secondary | ICD-10-CM

## 2010-11-19 DIAGNOSIS — I059 Rheumatic mitral valve disease, unspecified: Secondary | ICD-10-CM | POA: Insufficient documentation

## 2010-11-19 DIAGNOSIS — R0609 Other forms of dyspnea: Secondary | ICD-10-CM | POA: Insufficient documentation

## 2010-11-19 DIAGNOSIS — I714 Abdominal aortic aneurysm, without rupture: Secondary | ICD-10-CM

## 2010-11-19 DIAGNOSIS — R0989 Other specified symptoms and signs involving the circulatory and respiratory systems: Secondary | ICD-10-CM | POA: Insufficient documentation

## 2010-11-19 NOTE — Assessment & Plan Note (Signed)
Reviewed duplex from 12/11  Renal bypass is ok and no recurrent AAA

## 2010-11-19 NOTE — Assessment & Plan Note (Signed)
Well controlled.  Continue current medications and low sodium Dash type diet.    

## 2010-11-19 NOTE — Assessment & Plan Note (Signed)
Encouraged him to have R TKR with Alusio this year.  He will call next week.  Would benefit from cortisone shot as well

## 2010-11-19 NOTE — Progress Notes (Signed)
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  he has seen [name] [name] for surveillance of his AAA.    He prefers to have his carotids and subclavian followed up here.  I reviewed his from 12/11  Abdominal duplex and his Aoritic and renal grafts are fine.  Reviewed his carotid which is also stable with wide open right CEA and 40-59% LICA needing F/U duplex in a year   He has not had any significant TIAs her left upper extremity arm weakness.  Had a previous CABG.  His last catheter was done 310 2008.  His LIMA to the LAD was patent with a patent vein graft to the PDA and no significant  disease in his circumflex.  His LV function was normal. He denies SSCP and has chronic mild exertional dyspnea from lung disease.    Reviewed echo from today.  Has significant SEM.  NO AS suprisingly.  Normal EF and only mild MR  He is having more right knee and hip pain.  He has [name] name for F/U.  He has a large ventral hernia and with his obesity he has some discomfort when bending over.    ROS: Denies fever, malais, weight loss, blurry vision, decreased visual acuity, cough, sputum, SOB, hemoptysis, pleuritic pain, palpitaitons, heartburn, abdominal pain, melena, lower extremity edema, claudication, or rash.  All other systems reviewed and negative  General: Affect appropriate Chronically ill appearing HEENT: normal Neck supple with no adenopathy JVP normal bilateral bruits no thyromegaly Lungs clear with no wheezing and good diaphragmatic motion Heart:  S1/S2 no murmur,rub, gallop or click PMI normal Abdomen: benighn, BS positve, no tenderness, no AAA no bruit.  No HSM or HJR S/P AAA repair Distal pulses intact with no bruits No edema Neuro non-focal Skin warm and dry No muscular weakness   Current Outpatient Prescriptions  Medication Sig Dispense Refill  . aspirin 325 MG tablet Take 325 mg by mouth daily.        . clopidogrel (PLAVIX) 75 MG  tablet Take 75 mg by mouth daily.        . Coenzyme Q10 (COQ-10) 100 MG CAPS Take by mouth. 3 TABS DAILY       . [employer] OMEGA-3 KRILL OIL PO Take by mouth.        . Flaxseed, Linseed, (RA FLAX SEED OIL 1000) 1000 MG CAPS Take by mouth. 2 TABS DAILY        . lansoprazole (PREVACID) 15 MG capsule Take 15 mg by mouth daily.        [name] levothyroxine (SYNTHROID, LEVOTHROID) 112 MCG tablet Take 112 mcg by mouth daily.        [name] METOPROLOL TARTRATE PO Take by mouth. 50 MG 1 1/2 TAB TWICE DAILY       . niacin 500 MG tablet Take 500 mg by mouth. 4 TABS AT BEDTIME       . rosuvastatin (CRESTOR) 20 MG tablet Take 20 mg by mouth daily.        [name] triamterene-hydrochlorothiazide (DYAZIDE) 37.5-25 MG per capsule Take 1 capsule by mouth every morning.          Allergies  Review of patient's allergies indicates no known allergies.  Electrocardiogram:  Assessment and Plan

## 2010-11-19 NOTE — Patient Instructions (Signed)
Your physician recommends that you schedule a follow-up appointment in: December 2012 WITH DR [facility]  Your physician recommends that you continue on your current medications as directed. Please refer to the Current Medication list given to you today.  Your physician has requested that you have a renal artery duplex. During this test, an ultrasound is used to evaluate blood flow to the kidneys. Allow one hour for this exam. Do not eat after midnight the day before and avoid carbonated beverages. Take your medications as you usually do. DUE IN December  SEE DR [name] SAME DAY  Your physician has requested that you have an abdominal aorta duplex. During this test, an ultrasound is used to evaluate the aorta. Allow 30 minutes for this exam. Do not eat after midnight the day before and avoid carbonated beverages  DUE IN December SEE DR [name] SAME DAY

## 2010-11-19 NOTE — Assessment & Plan Note (Signed)
Cholesterol is at goal.  Continue current dose of statin and diet Rx.  No myalgias or side effects.  F/U  LFT's in 6 months. Lab Results  Component Value Date   LDLCALC 57 04/19/2010

## 2010-11-19 NOTE — Assessment & Plan Note (Signed)
40-59% left ICA stenosis with previous RCEA  F/U duplex 12/12

## 2010-11-19 NOTE — Assessment & Plan Note (Signed)
Stable with no angina and good activity level.  Continue medical Rx  

## 2010-12-14 ENCOUNTER — Telehealth: Payer: Self-pay | Admitting: Cardiovascular Disease

## 2010-12-14 MED ORDER — NITROGLYCERIN 0.4 MG SL SUBL: 0.4000 mg | SUBLINGUAL_TABLET | SUBLINGUAL | Status: DC | PRN

## 2010-12-14 NOTE — Telephone Encounter (Signed)
Pt needs refill of nitrostatin, pharmacy sent escribe last week, [facility]  [phone]

## 2010-12-20 ENCOUNTER — Other Ambulatory Visit: Payer: Self-pay | Admitting: *Deleted

## 2010-12-20 MED ORDER — METOPROLOL TARTRATE 50 MG PO TABS: ORAL_TABLET | ORAL | Status: DC

## 2010-12-29 ENCOUNTER — Other Ambulatory Visit: Payer: Self-pay | Admitting: *Deleted

## 2010-12-29 MED ORDER — METOPROLOL TARTRATE 50 MG PO TABS: ORAL_TABLET | ORAL | Status: DC

## 2011-03-10 LAB — CBC
HCT: 43.8
Hemoglobin: 15.5
MCV: 87.4
Platelets: 185
RDW: 13.7

## 2011-03-10 LAB — CARDIAC PANEL(CRET KIN+CKTOT+MB+TROPI)
CK, MB: 2.6
Troponin I: 0.01

## 2011-03-10 LAB — HEMOGLOBIN A1C
Hgb A1c MFr Bld: 5.6
Mean Plasma Glucose: 122

## 2011-03-10 LAB — COMPREHENSIVE METABOLIC PANEL
AST: 28
Alkaline Phosphatase: 76
BUN: 17
CO2: 27
Chloride: 102
Creatinine, Ser: 0.89
GFR calc non Af Amer: >60
Potassium: 3.8
Total Bilirubin: 1

## 2011-03-10 LAB — DIFFERENTIAL
Basophils Absolute: 0
Basophils Relative: 1
Eosinophils Relative: 1
Lymphocytes Relative: 26
Monocytes Absolute: 0.4

## 2011-03-10 LAB — MAGNESIUM: Magnesium: 2.3

## 2011-03-10 LAB — LIPASE, BLOOD: Lipase: 33

## 2011-03-22 ENCOUNTER — Other Ambulatory Visit: Payer: Self-pay | Admitting: *Deleted

## 2011-03-22 DIAGNOSIS — I10 Essential (primary) hypertension: Secondary | ICD-10-CM

## 2011-03-22 DIAGNOSIS — E7800 Pure hypercholesterolemia, unspecified: Secondary | ICD-10-CM

## 2011-03-22 DIAGNOSIS — E039 Hypothyroidism, unspecified: Secondary | ICD-10-CM

## 2011-03-23 ENCOUNTER — Other Ambulatory Visit: Payer: Self-pay

## 2011-03-23 MED ORDER — TRIAMTERENE-HCTZ 37.5-25 MG PO CAPS: 1.0000 | ORAL_CAPSULE | ORAL | Status: DC

## 2011-03-29 ENCOUNTER — Other Ambulatory Visit (INDEPENDENT_AMBULATORY_CARE_PROVIDER_SITE_OTHER): Payer: Medicare Other

## 2011-03-29 DIAGNOSIS — E039 Hypothyroidism, unspecified: Secondary | ICD-10-CM

## 2011-03-29 DIAGNOSIS — E7800 Pure hypercholesterolemia, unspecified: Secondary | ICD-10-CM

## 2011-03-29 DIAGNOSIS — I10 Essential (primary) hypertension: Secondary | ICD-10-CM

## 2011-03-29 LAB — LIPID PANEL
Cholesterol: 117 mg/dL (ref 0–200)
LDL Cholesterol: 48 mg/dL (ref 0–99)
Triglycerides: 113 mg/dL (ref 0.0–149.0)

## 2011-03-29 LAB — HEPATIC FUNCTION PANEL
ALT: 27 U/L (ref 0–53)
AST: 29 U/L (ref 0–37)
Albumin: 4.3 g/dL (ref 3.5–5.2)
Alkaline Phosphatase: 80 U/L (ref 39–117)
Bilirubin, Direct: 0.1 mg/dL (ref 0.0–0.3)
Total Protein: 7.2 g/dL (ref 6.0–8.3)

## 2011-04-11 ENCOUNTER — Telehealth: Payer: Self-pay | Admitting: *Deleted

## 2011-04-11 NOTE — Telephone Encounter (Signed)
PT WALKED IN OFFICE  FOR CRESTOR SAMPLES  CRESTOR 20 MG NUMBER 28   LOT NUM [id number] EXP 6-15  LEFT AT FRONT DESK .Zack Seal

## 2011-04-21 ENCOUNTER — Encounter (HOSPITAL_COMMUNITY): Payer: Medicare Other

## 2011-04-21 ENCOUNTER — Encounter (HOSPITAL_COMMUNITY): Payer: Self-pay

## 2011-04-21 ENCOUNTER — Ambulatory Visit (HOSPITAL_COMMUNITY)
Admission: RE | Admit: 2011-04-21 | Discharge: 2011-04-21 | Disposition: A | Discharge status: Home / Self Care | Payer: Medicare Other | Source: Ambulatory Visit | Attending: Orthopedic Surgery | Admitting: Orthopedic Surgery

## 2011-04-21 ENCOUNTER — Telehealth: Payer: Self-pay | Admitting: *Deleted

## 2011-04-21 ENCOUNTER — Other Ambulatory Visit (HOSPITAL_COMMUNITY): Payer: Self-pay | Admitting: Orthopedic Surgery

## 2011-04-21 DIAGNOSIS — Z01818 Encounter for other preprocedural examination: Secondary | ICD-10-CM | POA: Insufficient documentation

## 2011-04-21 DIAGNOSIS — M1710 Unilateral primary osteoarthritis, unspecified knee: Secondary | ICD-10-CM | POA: Insufficient documentation

## 2011-04-21 DIAGNOSIS — Z01812 Encounter for preprocedural laboratory examination: Secondary | ICD-10-CM | POA: Insufficient documentation

## 2011-04-21 DIAGNOSIS — I517 Cardiomegaly: Secondary | ICD-10-CM | POA: Insufficient documentation

## 2011-04-21 LAB — CBC
MCH: 31.5 pg (ref 26.0–34.0)
MCV: 86.6 fL (ref 78.0–100.0)
Platelets: 151 10*3/uL (ref 150–400)
RBC: 4.86 MIL/uL (ref 4.22–5.81)
RDW: 13.6 % (ref 11.5–15.5)

## 2011-04-21 LAB — COMPREHENSIVE METABOLIC PANEL
ALT: 24 U/L (ref 0–53)
CO2: 28 mEq/L (ref 19–32)
Calcium: 10.2 mg/dL (ref 8.4–10.5)
GFR calc Af Amer: >90 mL/min (ref >90)
GFR calc non Af Amer: 86 mL/min — ABNORMAL LOW (ref >90)
Glucose, Bld: 114 mg/dL — ABNORMAL HIGH (ref 70–99)
Sodium: 137 mEq/L (ref 135–145)

## 2011-04-21 LAB — URINALYSIS, ROUTINE W REFLEX MICROSCOPIC
Bilirubin Urine: NEGATIVE
Hgb urine dipstick: NEGATIVE
Ketones, ur: NEGATIVE mg/dL
Protein, ur: NEGATIVE mg/dL
Urobilinogen, UA: 1 mg/dL (ref 0.0–1.0)

## 2011-04-21 NOTE — Patient Instructions (Addendum)
20 [name] [name]  04/21/2011   Your procedure is scheduled on: 04/29/11   Surgery [id number]   Report to [facility] at 0915 AM.  Call this number if you have problems the morning of surgery: [phone]   Remember:   Do not eat food:After Midnight.  Do not drink clear liquids: After Midnight.  Take these medicines the morning of surgery with A SIP OF WATER: prevacid, levothyroxine                lopressor  Do not wear jewelry.   Do not wear lotions, powders, or perfumes. You may wear deodorant.  Do not shave 48 hours prior to surgery.  Do not bring valuables to the hospital.  Contacts, dentures or bridgework may not be worn into surgery.  Leave suitcase in the car. After surgery it may be brought to your room.  For patients admitted to the hospital, checkout time is 11:00 AM the day of discharge.      Special Instructions: Incentive Spirometry - Practice and bring it with you on the day of surgery. and CHG Shower Use Special Wash: 1/2 bottle night before surgery and 1/2 bottle morning of surgery. Use CHG chin to toes .  Wash face and private parts with regular soap.    Please read over the following fact sheets that you were given: MRSA Information, blood transfusion fact sheet, leg excercises reviewed, pain reviewed.

## 2011-04-21 NOTE — Telephone Encounter (Signed)
Received call from pre-admitting at the hosp, pt EKG shows heart rate of 44. The pt currently takes 75 mg lopressor bid. Per dr [name] pt to decrease to 25 mg bid. Pt will call with any palpitations or elevated heart rate [name] [name]

## 2011-04-21 NOTE — Pre-Procedure Instructions (Signed)
11/19/10 echo on chart  LOV note with cardiologist- Dr [name] 06/03/10  Stress test 08/03/2007 on chart  Last cath 2008 on chart

## 2011-04-21 NOTE — Pre-Procedure Instructions (Signed)
04/21/2011 Notified nurse of Dr [name], [name] RN that ekg - heart rate 44. Pt asymptomatic. I informed pt I would call and inform cardiologist. I told pt to call and inform cardiologist also. Heart rate on dynamap- 46-47.  Previous readings available are 52-56.

## 2011-04-21 NOTE — Pre-Procedure Instructions (Signed)
04/21/11 [name] , RN of Dr [name] notified of heart rate of 44 at preop visit.  Pt asymptomatic.  Heart rate 56 earlier in week and previous history.   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  Confirmation received for both.  Notified [name] of Dr [name] office of confirmed ekg and reading and had been faxed to Dr [name] and Dr [name].

## 2011-04-22 ENCOUNTER — Encounter (HOSPITAL_COMMUNITY): Payer: Self-pay | Admitting: Pharmacy Technician

## 2011-04-26 ENCOUNTER — Other Ambulatory Visit: Payer: Self-pay | Admitting: Orthopedic Surgery

## 2011-04-26 NOTE — H&P (Signed)
[name] [name] DOB: 1940/06/24  Date of Admission 04/29/2011  Chief Complaint - Right Knee Pain  History of Present Illness The patient is a 70 year old male who comes in today for a preoperative History and Physical. The patient is scheduled for a right total knee arthroplasty to be performed by Dr. [name]. [name], MD at [facility] on 04/29/2011 .   Allergies No Known Drug Allergies  Medications Metoprolol Plavix Triamterene/HCTZ Levothyroxine Crestor Slo-Niacin Flaxseed Oil Prevacid Co-Q-10 Omega 3 Oil Aspirin Aleve  Problem List/Past Medical Steal syndrome, subclavian Gastroesophageal Reflux Disease High blood pressure Hypercholesterolemia Hypothyroidism Kidney Stone Myocardial infarction. November 1994 Migraine Headache. History Impaired Hearing. Right Ear Coronary Artery Disease/Heart Disease Aneurysm. Abdominal Renal Artery Stenosis Abdominal Ventral Hernia Hemorrhoids Carotid Arterial Disease  Family History Bleeding disorder. sister Cancer. sister and brother Cerebrovascular Accident. mother Depression. sister Diabetes Mellitus. 3 sisters Heart Disease. father Heart disease in male family member before age 67 Rheumatoid Arthritis. sister   Social History Alcohol use. never consumed alcohol Children. 3 Current work status. retired Financial planner (Currently). no Drug/Alcohol Rehab (Previously). no Exercise. Exercises weekly; does running / walking Illicit drug use. no Living situation. live with spouse Marital status. married Number of flights of stairs before winded. 4-5 Pain Contract. no Tobacco / smoke exposure. yes outdoors only Tobacco use. former smoker; smoke(d) 1 pack(s) per day Quit November 1994  Past Surgical History Carotid Artery Surgery. Date: 88. right Colon Polyp Removal - Colonoscopy Coronary Artery Bypass Graft. Date: 08/1993. 3 vessels Gallbladder  Surgery. Date: 2004. laporoscopic Heart Stents. 1999, 2000 Other Orthopaedic Surgery Vasectomy Renal Artery Bypass, Bilateral. Date: 04/1993. Left Partial Knee Replacement. Date: 2002. Dr. [name]  Review of Systems General:Not Present- Chills, Fever, Night Sweats, Appetite Loss, Fatigue, Feeling sick, Weight Gain and Weight Loss. Skin:Not Present- Itching, Rash, Skin Color Changes, Ulcer, Psoriasis and Change in Hair or Nails. HEENT:Present- Hearing problems. Not Present- Sensitivity to light, Nose Bleed and Ringing in the Ears. Neck:Not Present- Swollen Glands and Neck Mass. Respiratory:Not Present- Snoring, Chronic Cough, Bloody sputum and Dyspnea. Cardiovascular:Present- Leg Cramps. Not Present- Shortness of Breath, Chest Pain, Swelling of Extremities and Palpitations. Gastrointestinal:Present- Heartburn. Not Present- Bloody Stool, Abdominal Pain, Vomiting, Nausea and Incontinence of Stool. Male Genitourinary:Present- Frequency. Not Present- Blood in Urine, Incontinence and Nocturia. Musculoskeletal:Present- Joint Swelling and Back Pain. Not Present- Muscle Weakness, Muscle Pain, Joint Stiffness and Joint Pain. Neurological:Not Present- Tingling, Numbness, Burning, Tremor, Headaches and Dizziness. Psychiatric:Not Present- Anxiety, Depression and Memory Loss. Endocrine:Not Present- Cold Intolerance, Heat Intolerance, Excessive hunger and Excessive Thirst. Hematology:Present- Easy Bruising. Not Present- Abnormal Bleeding, Anemia and Blood Clots.  Vitals 04/19/2011 4:20 PM Weight: 177 lb Height: 67 in Weight was reported by patient. Height was reported by patient. Body Surface Area: 1.95 m Body Mass Index: 27.72 kg/m Pulse: 54 (Regular) Resp.: 12 (Unlabored) BP: 150/66 (Sitting, Right Arm, Standard)  Physical Exam GENERAL: Patient is a 70 y.o. male, well-nourished, well-developed, no acute distress. Alert, oriented, cooperative. Good Historian HENT:   Normocephalic, atraumatic. Pupils round and reactive. EOMs intact. NECK:  Supple. Bilateral bruits  - right greater than left CHEST:  Clear to anterior and posterior chest walls. No rhonchi, rales, wheezes. HEART:  Regular, rate and rhythm.  Systolic ejection murmur grade III/VI best heard over pulmonic point and along left sternal border, faint over aortic.  S1 and S2 noted. ABDOMEN:  Soft, nontender, bowel sounds present. Soft ventral hernia is present. RECTAL/BREAST/GENITALIA:  Not done, not pertinent to present  illness. EXTREMITIES:  Right knee - no effusion, marked crepitus, range of motion 5 -120 degrees, tender more medial that lateral, no instability.  RADIOGRAPHS Radiographs, AP both knees and lateral, show the unicompartmental replacement on the left to be in good position with no abnormalities. On the right he has advanced endstage arthritis of the knee.  Assessment & Plan Osteoarthritis, Knee (715.96) Impression: Right Knee  Patient to undergo a Right Total Knee Replacement. Surgery to be performed by Dr. [name]. Risks and benefits of the surgery have been discussed with the patient and they elect to proceed with surgery.  There are on active contraindications to upcoming procedure such as ongoing infection or progressive neurological disease. Plans for home after hospital stay.

## 2011-04-28 MED ORDER — BUPIVACAINE 0.25 % ON-Q PUMP SINGLE CATH 300ML
300.0000 mL | INJECTION | Status: DC
  Administered 2011-04-29: 300 mL
  Filled 2011-04-28: qty 300

## 2011-04-28 NOTE — Pre-Procedure Instructions (Signed)
04/28/11 Note seen in EPIC where pt was asked to decrease Lopressor to 25 mg per Dr [name] .  Note was seen under chart review under phone call on 04/21/2011 per [name] RN , nurse of Dr [name].

## 2011-04-29 ENCOUNTER — Encounter (HOSPITAL_COMMUNITY)
Admission: RE | Disposition: A | Discharge status: Home Health | Payer: Self-pay | Source: Ambulatory Visit | Attending: Orthopedic Surgery

## 2011-04-29 ENCOUNTER — Encounter (HOSPITAL_COMMUNITY): Payer: Self-pay | Admitting: *Deleted

## 2011-04-29 ENCOUNTER — Encounter (HOSPITAL_COMMUNITY): Payer: Self-pay | Admitting: Anesthesiology

## 2011-04-29 ENCOUNTER — Inpatient Hospital Stay (HOSPITAL_COMMUNITY)
Admission: RE | Admit: 2011-04-29 | Discharge: 2011-05-03 | DRG: 470 | Disposition: A | Discharge status: Home Health | Payer: Medicare Other | Source: Ambulatory Visit | Attending: Orthopedic Surgery | Admitting: Orthopedic Surgery

## 2011-04-29 ENCOUNTER — Inpatient Hospital Stay (HOSPITAL_COMMUNITY): Payer: Medicare Other | Admitting: Anesthesiology

## 2011-04-29 DIAGNOSIS — M810 Age-related osteoporosis without current pathological fracture: Secondary | ICD-10-CM | POA: Diagnosis present

## 2011-04-29 DIAGNOSIS — K5730 Diverticulosis of large intestine without perforation or abscess without bleeding: Secondary | ICD-10-CM | POA: Diagnosis present

## 2011-04-29 DIAGNOSIS — Z87442 Personal history of urinary calculi: Secondary | ICD-10-CM

## 2011-04-29 DIAGNOSIS — I2510 Atherosclerotic heart disease of native coronary artery without angina pectoris: Secondary | ICD-10-CM

## 2011-04-29 DIAGNOSIS — Z9861 Coronary angioplasty status: Secondary | ICD-10-CM

## 2011-04-29 DIAGNOSIS — E039 Hypothyroidism, unspecified: Secondary | ICD-10-CM | POA: Diagnosis present

## 2011-04-29 DIAGNOSIS — K219 Gastro-esophageal reflux disease without esophagitis: Secondary | ICD-10-CM | POA: Diagnosis present

## 2011-04-29 DIAGNOSIS — R1310 Dysphagia, unspecified: Secondary | ICD-10-CM | POA: Diagnosis present

## 2011-04-29 DIAGNOSIS — I252 Old myocardial infarction: Secondary | ICD-10-CM

## 2011-04-29 DIAGNOSIS — R29898 Other symptoms and signs involving the musculoskeletal system: Secondary | ICD-10-CM | POA: Diagnosis present

## 2011-04-29 DIAGNOSIS — R339 Retention of urine, unspecified: Secondary | ICD-10-CM | POA: Diagnosis not present

## 2011-04-29 DIAGNOSIS — E871 Hypo-osmolality and hyponatremia: Secondary | ICD-10-CM | POA: Diagnosis not present

## 2011-04-29 DIAGNOSIS — I10 Essential (primary) hypertension: Secondary | ICD-10-CM | POA: Diagnosis present

## 2011-04-29 DIAGNOSIS — M1710 Unilateral primary osteoarthritis, unspecified knee: Secondary | ICD-10-CM

## 2011-04-29 DIAGNOSIS — E7800 Pure hypercholesterolemia, unspecified: Secondary | ICD-10-CM | POA: Diagnosis present

## 2011-04-29 DIAGNOSIS — Z01812 Encounter for preprocedural laboratory examination: Secondary | ICD-10-CM

## 2011-04-29 HISTORY — PX: TOTAL KNEE ARTHROPLASTY: SHX125

## 2011-04-29 LAB — TYPE AND SCREEN: ABO/RH(D): O POS

## 2011-04-29 SURGERY — ARTHROPLASTY, KNEE, TOTAL
Anesthesia: General | Site: Knee | Laterality: Right | Wound class: Clean

## 2011-04-29 MED ORDER — DOCUSATE SODIUM 100 MG PO CAPS
100.0000 mg | ORAL_CAPSULE | Freq: Two times a day (BID) | ORAL | Status: DC
  Administered 2011-04-29 – 2011-05-03 (×8): 100 mg via ORAL
  Filled 2011-04-29 (×9): qty 1

## 2011-04-29 MED ORDER — RIVAROXABAN 10 MG PO TABS
10.0000 mg | ORAL_TABLET | ORAL | Status: DC
  Administered 2011-04-30 – 2011-05-03 (×4): 10 mg via ORAL
  Filled 2011-04-29 (×4): qty 1

## 2011-04-29 MED ORDER — CEFAZOLIN SODIUM 1-5 GM-% IV SOLN
INTRAVENOUS | Status: AC
  Filled 2011-04-29: qty 100

## 2011-04-29 MED ORDER — FENTANYL CITRATE 0.05 MG/ML IJ SOLN
INTRAMUSCULAR | Status: AC
  Administered 2011-04-29: 50 ug via INTRAVENOUS
  Filled 2011-04-29: qty 2

## 2011-04-29 MED ORDER — ONDANSETRON HCL 4 MG/2ML IJ SOLN: 4.0000 mg | Freq: Four times a day (QID) | INTRAMUSCULAR | Status: DC | PRN

## 2011-04-29 MED ORDER — NITROGLYCERIN 0.4 MG SL SUBL: 0.4000 mg | SUBLINGUAL_TABLET | SUBLINGUAL | Status: DC | PRN

## 2011-04-29 MED ORDER — CEFAZOLIN SODIUM 1-5 GM-% IV SOLN
1.0000 g | Freq: Once | INTRAVENOUS | Status: AC
  Administered 2011-04-29: 2 g via INTRAVENOUS

## 2011-04-29 MED ORDER — ONDANSETRON HCL 4 MG/2ML IJ SOLN: 4.0000 mg | Freq: Once | INTRAMUSCULAR | Status: DC | PRN

## 2011-04-29 MED ORDER — BISACODYL 5 MG PO TBEC
10.0000 mg | DELAYED_RELEASE_TABLET | Freq: Every day | ORAL | Status: DC | PRN
  Administered 2011-04-30: 10 mg via ORAL
  Filled 2011-04-29: qty 2

## 2011-04-29 MED ORDER — FENTANYL CITRATE 0.05 MG/ML IJ SOLN
25.0000 ug | INTRAMUSCULAR | Status: DC | PRN
  Administered 2011-04-29 (×2): 50 ug via INTRAVENOUS

## 2011-04-29 MED ORDER — MENTHOL 3 MG MT LOZG
1.0000 | LOZENGE | OROMUCOSAL | Status: DC | PRN
  Filled 2011-04-29: qty 9

## 2011-04-29 MED ORDER — MEPERIDINE HCL 25 MG/ML IJ SOLN: 6.2500 mg | INTRAMUSCULAR | Status: DC | PRN

## 2011-04-29 MED ORDER — ONDANSETRON HCL 4 MG/2ML IJ SOLN
INTRAMUSCULAR | Status: DC | PRN
  Administered 2011-04-29: 4 mg via INTRAVENOUS

## 2011-04-29 MED ORDER — BUPIVACAINE ON-Q PAIN PUMP (FOR ORDER SET NO CHG)
INJECTION | Status: DC
  Filled 2011-04-29: qty 1

## 2011-04-29 MED ORDER — LEVOTHYROXINE SODIUM 112 MCG PO TABS
112.0000 ug | ORAL_TABLET | ORAL | Status: DC
  Administered 2011-04-30 – 2011-05-03 (×4): 112 ug via ORAL
  Filled 2011-04-29 (×6): qty 1

## 2011-04-29 MED ORDER — MORPHINE SULFATE 10 MG/ML IJ SOLN
INTRAMUSCULAR | Status: AC
  Filled 2011-04-29: qty 1

## 2011-04-29 MED ORDER — TRIAMTERENE-HCTZ 37.5-25 MG PO TABS
1.0000 | ORAL_TABLET | Freq: Every day | ORAL | Status: DC
  Administered 2011-04-30 – 2011-05-03 (×4): 1 via ORAL
  Filled 2011-04-29 (×5): qty 1

## 2011-04-29 MED ORDER — BISACODYL 10 MG RE SUPP: 10.0000 mg | Freq: Every day | RECTAL | Status: DC | PRN

## 2011-04-29 MED ORDER — LACTATED RINGERS IV SOLN
INTRAVENOUS | Status: DC
  Administered 2011-04-29: 1000 mL via INTRAVENOUS
  Administered 2011-04-29 (×3): via INTRAVENOUS

## 2011-04-29 MED ORDER — OXYCODONE HCL 5 MG PO TABS
5.0000 mg | ORAL_TABLET | ORAL | Status: DC | PRN
  Administered 2011-04-30: 5 mg via ORAL
  Administered 2011-05-01 – 2011-05-03 (×11): 10 mg via ORAL
  Filled 2011-04-29 (×9): qty 2
  Filled 2011-04-29: qty 1
  Filled 2011-04-29 (×2): qty 2

## 2011-04-29 MED ORDER — ACETAMINOPHEN 10 MG/ML IV SOLN
INTRAVENOUS | Status: AC
  Filled 2011-04-29: qty 100

## 2011-04-29 MED ORDER — ATROPINE SULFATE 0.4 MG/ML IJ SOLN
INTRAMUSCULAR | Status: DC | PRN
  Administered 2011-04-29: 0.4 mg via INTRAVENOUS

## 2011-04-29 MED ORDER — PHENOL 1.4 % MT LIQD
1.0000 | OROMUCOSAL | Status: DC | PRN
  Filled 2011-04-29: qty 177

## 2011-04-29 MED ORDER — ONDANSETRON HCL 4 MG PO TABS: 4.0000 mg | ORAL_TABLET | Freq: Four times a day (QID) | ORAL | Status: DC | PRN

## 2011-04-29 MED ORDER — PROPOFOL 10 MG/ML IV EMUL
INTRAVENOUS | Status: DC | PRN
  Administered 2011-04-29: 130 mg via INTRAVENOUS

## 2011-04-29 MED ORDER — POLYETHYLENE GLYCOL 3350 17 G PO PACK
17.0000 g | PACK | Freq: Every day | ORAL | Status: DC | PRN
  Administered 2011-04-30: 22:00:00 via ORAL
  Administered 2011-05-01: 17 g via ORAL
  Filled 2011-04-29 (×2): qty 1

## 2011-04-29 MED ORDER — LIDOCAINE HCL (CARDIAC) 20 MG/ML IV SOLN
INTRAVENOUS | Status: DC | PRN
  Administered 2011-04-29: 75 mg via INTRAVENOUS

## 2011-04-29 MED ORDER — SUFENTANIL CITRATE 50 MCG/ML IV SOLN
INTRAVENOUS | Status: DC | PRN
  Administered 2011-04-29 (×2): 10 ug via INTRAVENOUS
  Administered 2011-04-29: 20 ug via INTRAVENOUS

## 2011-04-29 MED ORDER — DIPHENHYDRAMINE HCL 12.5 MG/5ML PO ELIX
12.5000 mg | ORAL_SOLUTION | ORAL | Status: DC | PRN
Start: 2011-04-29 — End: 2011-05-03

## 2011-04-29 MED ORDER — FLEET ENEMA 7-19 GM/118ML RE ENEM: 1.0000 | ENEMA | Freq: Every day | RECTAL | Status: DC | PRN

## 2011-04-29 MED ORDER — NIACIN 500 MG PO TABS
500.0000 mg | ORAL_TABLET | Freq: Every day | ORAL | Status: DC
  Filled 2011-04-29 (×5): qty 1

## 2011-04-29 MED ORDER — MAGNESIUM HYDROXIDE 400 MG/5ML PO SUSP: 30.0000 mL | Freq: Two times a day (BID) | ORAL | Status: DC | PRN

## 2011-04-29 MED ORDER — SODIUM CHLORIDE 0.9 % IJ SOLN: 9.0000 mL | INTRAMUSCULAR | Status: DC | PRN

## 2011-04-29 MED ORDER — ROPIVACAINE HCL 5 MG/ML IJ SOLN
INTRAMUSCULAR | Status: DC | PRN
  Administered 2011-04-29: 30 mL via EPIDURAL

## 2011-04-29 MED ORDER — METOCLOPRAMIDE HCL 5 MG/ML IJ SOLN: 5.0000 mg | Freq: Three times a day (TID) | INTRAMUSCULAR | Status: DC | PRN

## 2011-04-29 MED ORDER — ACETAMINOPHEN 10 MG/ML IV SOLN
1000.0000 mg | Freq: Four times a day (QID) | INTRAVENOUS | Status: AC
  Administered 2011-04-29 – 2011-04-30 (×4): 1000 mg via INTRAVENOUS
  Filled 2011-04-29 (×4): qty 100

## 2011-04-29 MED ORDER — DEXAMETHASONE SODIUM PHOSPHATE 4 MG/ML IJ SOLN
INTRAMUSCULAR | Status: DC | PRN
  Administered 2011-04-29: 10 mg via INTRAVENOUS

## 2011-04-29 MED ORDER — SUCCINYLCHOLINE CHLORIDE 20 MG/ML IJ SOLN
INTRAMUSCULAR | Status: DC | PRN
  Administered 2011-04-29: 80 mg via INTRAVENOUS

## 2011-04-29 MED ORDER — SODIUM CHLORIDE 0.9 % IR SOLN
Status: DC | PRN
  Administered 2011-04-29: 1000 mL

## 2011-04-29 MED ORDER — ACETAMINOPHEN 650 MG RE SUPP: 650.0000 mg | Freq: Four times a day (QID) | RECTAL | Status: DC | PRN

## 2011-04-29 MED ORDER — MORPHINE SULFATE (PF) 1 MG/ML IV SOLN
INTRAVENOUS | Status: AC
  Administered 2011-04-29: 14:00:00 via INTRAVENOUS
  Administered 2011-04-29: 6 mg via INTRAVENOUS
  Administered 2011-04-30: 7 mg via INTRAVENOUS
  Administered 2011-04-30: 3 mg via INTRAVENOUS
  Administered 2011-04-30: 2 mg via INTRAVENOUS
  Administered 2011-04-30: 3 mg via INTRAVENOUS
  Filled 2011-04-29: qty 30

## 2011-04-29 MED ORDER — NALOXONE HCL 0.4 MG/ML IJ SOLN: 0.4000 mg | INTRAMUSCULAR | Status: DC | PRN

## 2011-04-29 MED ORDER — KETAMINE HCL 10 MG/ML IJ SOLN
INTRAMUSCULAR | Status: DC | PRN
  Administered 2011-04-29: 20 mg via INTRAVENOUS
  Administered 2011-04-29: 10 mg via INTRAVENOUS

## 2011-04-29 MED ORDER — DIPHENHYDRAMINE HCL 12.5 MG/5ML PO ELIX
12.5000 mg | ORAL_SOLUTION | Freq: Four times a day (QID) | ORAL | Status: DC | PRN
  Filled 2011-04-29: qty 5

## 2011-04-29 MED ORDER — ACETAMINOPHEN 325 MG PO TABS
650.0000 mg | ORAL_TABLET | Freq: Four times a day (QID) | ORAL | Status: DC | PRN
  Administered 2011-05-01 – 2011-05-02 (×4): 650 mg via ORAL
  Filled 2011-04-29 (×6): qty 2

## 2011-04-29 MED ORDER — PANTOPRAZOLE SODIUM 20 MG PO TBEC
20.0000 mg | DELAYED_RELEASE_TABLET | Freq: Every day | ORAL | Status: DC
  Administered 2011-04-29 – 2011-05-03 (×5): 20 mg via ORAL
  Filled 2011-04-29 (×5): qty 1

## 2011-04-29 MED ORDER — METHOCARBAMOL 500 MG PO TABS
500.0000 mg | ORAL_TABLET | Freq: Four times a day (QID) | ORAL | Status: DC | PRN
  Administered 2011-04-30 – 2011-05-03 (×5): 500 mg via ORAL
  Filled 2011-04-29 (×6): qty 1

## 2011-04-29 MED ORDER — ROSUVASTATIN CALCIUM 20 MG PO TABS
20.0000 mg | ORAL_TABLET | Freq: Every day | ORAL | Status: DC
  Administered 2011-04-29 – 2011-05-02 (×4): 20 mg via ORAL
  Filled 2011-04-29 (×5): qty 1

## 2011-04-29 MED ORDER — METOCLOPRAMIDE HCL 10 MG PO TABS
5.0000 mg | ORAL_TABLET | Freq: Three times a day (TID) | ORAL | Status: DC | PRN
Start: 2011-04-29 — End: 2011-05-03

## 2011-04-29 MED ORDER — FENTANYL CITRATE 0.05 MG/ML IJ SOLN
100.0000 ug | INTRAMUSCULAR | Status: AC | PRN
  Administered 2011-04-29: 100 ug via INTRAVENOUS

## 2011-04-29 MED ORDER — METHOCARBAMOL 100 MG/ML IJ SOLN
500.0000 mg | Freq: Four times a day (QID) | INTRAMUSCULAR | Status: DC | PRN
  Administered 2011-04-29: 500 mg via INTRAVENOUS
  Filled 2011-04-29: qty 5

## 2011-04-29 MED ORDER — METOPROLOL TARTRATE 25 MG PO TABS
25.0000 mg | ORAL_TABLET | Freq: Two times a day (BID) | ORAL | Status: DC
  Administered 2011-04-29 – 2011-05-03 (×8): 25 mg via ORAL
  Filled 2011-04-29 (×9): qty 1

## 2011-04-29 MED ORDER — POTASSIUM CHLORIDE IN NACL 20-0.9 MEQ/L-% IV SOLN
INTRAVENOUS | Status: DC
  Administered 2011-04-29 – 2011-05-01 (×4): via INTRAVENOUS
  Filled 2011-04-29 (×8): qty 1000

## 2011-04-29 MED ORDER — ACETAMINOPHEN 10 MG/ML IV SOLN
INTRAVENOUS | Status: DC | PRN
  Administered 2011-04-29: 1000 mg via INTRAVENOUS

## 2011-04-29 MED ORDER — DIPHENHYDRAMINE HCL 50 MG/ML IJ SOLN: 12.5000 mg | Freq: Four times a day (QID) | INTRAMUSCULAR | Status: DC | PRN

## 2011-04-29 MED ORDER — CEFAZOLIN SODIUM 1-5 GM-% IV SOLN
1.0000 g | Freq: Four times a day (QID) | INTRAVENOUS | Status: AC
  Administered 2011-04-29 – 2011-04-30 (×3): 1 g via INTRAVENOUS
  Filled 2011-04-29 (×3): qty 50

## 2011-04-29 SURGICAL SUPPLY — 53 items
BAG SPEC THK2 15X12 ZIP CLS (MISCELLANEOUS) ×1
BAG ZIPLOCK 12X15 (MISCELLANEOUS) ×2 IMPLANT
BANDAGE ELASTIC 6 VELCRO ST LF (GAUZE/BANDAGES/DRESSINGS) ×2 IMPLANT
BANDAGE ESMARK 6X9 LF (GAUZE/BANDAGES/DRESSINGS) ×1 IMPLANT
BLADE SAG 18X100X1.27 (BLADE) ×2 IMPLANT
BLADE SAW SGTL 11.0X1.19X90.0M (BLADE) ×2 IMPLANT
BNDG CMPR 9X6 STRL LF SNTH (GAUZE/BANDAGES/DRESSINGS) ×1
BNDG ESMARK 6X9 LF (GAUZE/BANDAGES/DRESSINGS) ×2
BOWL SMART MIX CTS (DISPOSABLE) ×2 IMPLANT
CATH KIT ON-Q SILVERSOAK 5 (CATHETERS) ×1 IMPLANT
CATH KIT ON-Q SILVERSOAK 5IN (CATHETERS) ×2 IMPLANT
CEMENT HV SMART SET (Cement) ×4 IMPLANT
CLOTH BEACON ORANGE TIMEOUT ST (SAFETY) ×2 IMPLANT
CUFF TOURN SGL QUICK 34 (TOURNIQUET CUFF) ×2
CUFF TRNQT CYL 34X4X40X1 (TOURNIQUET CUFF) ×1 IMPLANT
DRAPE EXTREMITY T 121X128X90 (DRAPE) ×2 IMPLANT
DRAPE POUCH INSTRU U-SHP 10X18 (DRAPES) ×2 IMPLANT
DRAPE U-SHAPE 47X51 STRL (DRAPES) ×2 IMPLANT
DRSG ADAPTIC 3X8 NADH LF (GAUZE/BANDAGES/DRESSINGS) ×2 IMPLANT
DURAPREP 26ML APPLICATOR (WOUND CARE) ×2 IMPLANT
ELECT REM PT RETURN 9FT ADLT (ELECTROSURGICAL) ×2
ELECTRODE REM PT RTRN 9FT ADLT (ELECTROSURGICAL) ×1 IMPLANT
EVACUATOR 1/8 PVC DRAIN (DRAIN) ×2 IMPLANT
FACESHIELD LNG OPTICON STERILE (SAFETY) ×10 IMPLANT
GLOVE BIO SURGEON STRL SZ7.5 (GLOVE) ×2 IMPLANT
GLOVE BIO SURGEON STRL SZ8 (GLOVE) ×2 IMPLANT
GLOVE BIOGEL PI IND STRL 8 (GLOVE) ×2 IMPLANT
GLOVE BIOGEL PI INDICATOR 8 (GLOVE) ×2
GOWN PREVENTION PLUS XLARGE (GOWN DISPOSABLE) ×2 IMPLANT
GOWN STRL REIN XL XLG (GOWN DISPOSABLE) ×2 IMPLANT
HANDPIECE INTERPULSE COAX TIP (DISPOSABLE) ×2
IMMOBILIZER KNEE 20 (SOFTGOODS) ×2
IMMOBILIZER KNEE 20 THIGH 36 (SOFTGOODS) ×1 IMPLANT
KIT BASIN OR (CUSTOM PROCEDURE TRAY) ×2 IMPLANT
MANIFOLD NEPTUNE II (INSTRUMENTS) ×2 IMPLANT
NS IRRIG 1000ML POUR BTL (IV SOLUTION) ×2 IMPLANT
PACK TOTAL JOINT (CUSTOM PROCEDURE TRAY) ×2 IMPLANT
PAD ABD 7.5X8 STRL (GAUZE/BANDAGES/DRESSINGS) ×2 IMPLANT
PADDING CAST COTTON 6X4 STRL (CAST SUPPLIES) ×6 IMPLANT
PADDING WEBRIL 6 STERILE (GAUZE/BANDAGES/DRESSINGS) ×1 IMPLANT
POSITIONER SURGICAL ARM (MISCELLANEOUS) ×2 IMPLANT
SET HNDPC FAN SPRY TIP SCT (DISPOSABLE) ×1 IMPLANT
SPONGE GAUZE 4X4 12PLY (GAUZE/BANDAGES/DRESSINGS) ×2 IMPLANT
STRIP CLOSURE SKIN 1/2X4 (GAUZE/BANDAGES/DRESSINGS) ×4 IMPLANT
SUCTION FRAZIER 12FR DISP (SUCTIONS) ×2 IMPLANT
SUT MNCRL AB 4-0 PS2 18 (SUTURE) ×2 IMPLANT
SUT PDS AB 1 CT1 27 (SUTURE) ×6 IMPLANT
SUT VIC AB 2-0 CT1 27 (SUTURE) ×6
SUT VIC AB 2-0 CT1 TAPERPNT 27 (SUTURE) ×3 IMPLANT
TOWEL OR 17X26 10 PK STRL BLUE (TOWEL DISPOSABLE) ×4 IMPLANT
TRAY FOLEY CATH 14FRSI W/METER (CATHETERS) ×2 IMPLANT
WATER STERILE IRR 1500ML POUR (IV SOLUTION) ×2 IMPLANT
WRAP KNEE MAXI GEL POST OP (GAUZE/BANDAGES/DRESSINGS) ×4 IMPLANT

## 2011-04-29 NOTE — Preoperative (Signed)
Beta Blockers   Reason not to administer Beta Blockers:Not Applicable 

## 2011-04-29 NOTE — H&P (View-Only) (Signed)
[name] [name] DOB: 06/02/1941  Date of Admission 04/29/2011  Chief Complaint - Right Knee Pain  History of Present Illness The patient is a 70 year old male who comes in today for a preoperative History and Physical. The patient is scheduled for a right total knee arthroplasty to be performed by Dr. Frank V. [name], MD at [facility] on 04/29/2011 .   Allergies No Known Drug Allergies  Medications Metoprolol Plavix Triamterene/HCTZ Levothyroxine Crestor Slo-Niacin Flaxseed Oil Prevacid Co-Q-10 Omega 3 Oil Aspirin Aleve  Problem List/Past Medical Steal syndrome, subclavian Gastroesophageal Reflux Disease High blood pressure Hypercholesterolemia Hypothyroidism Kidney Stone Myocardial infarction. November 1994 Migraine Headache. History Impaired Hearing. Right Ear Coronary Artery Disease/Heart Disease Aneurysm. Abdominal Renal Artery Stenosis Abdominal Ventral Hernia Hemorrhoids Carotid Arterial Disease  Family History Bleeding disorder. sister Cancer. sister and brother Cerebrovascular Accident. mother Depression. sister Diabetes Mellitus. 3 sisters Heart Disease. father Heart disease in male family member before age 55 Rheumatoid Arthritis. sister   Social History Alcohol use. never consumed alcohol Children. 3 Current work status. retired Drug/Alcohol Rehab (Currently). no Drug/Alcohol Rehab (Previously). no Exercise. Exercises weekly; does running / walking Illicit drug use. no Living situation. live with spouse Marital status. married Number of flights of stairs before winded. 4-5 Pain Contract. no Tobacco / smoke exposure. yes outdoors only Tobacco use. former smoker; smoke(d) 1 pack(s) per day Quit November 1994  Past Surgical History Carotid Artery Surgery. Date: 1998. right Colon Polyp Removal - Colonoscopy Coronary Artery Bypass Graft. Date: 08/1993. 3 vessels Gallbladder  Surgery. Date: 2004. laporoscopic Heart Stents. 1999, 2000 Other Orthopaedic Surgery Vasectomy Renal Artery Bypass, Bilateral. Date: 04/1993. Left Partial Knee Replacement. Date: 2002. Dr. Yates  Review of Systems General:Not Present- Chills, Fever, Night Sweats, Appetite Loss, Fatigue, Feeling sick, Weight Gain and Weight Loss. Skin:Not Present- Itching, Rash, Skin Color Changes, Ulcer, Psoriasis and Change in Hair or Nails. HEENT:Present- Hearing problems. Not Present- Sensitivity to light, Nose Bleed and Ringing in the Ears. Neck:Not Present- Swollen Glands and Neck Mass. Respiratory:Not Present- Snoring, Chronic Cough, Bloody sputum and Dyspnea. Cardiovascular:Present- Leg Cramps. Not Present- Shortness of Breath, Chest Pain, Swelling of Extremities and Palpitations. Gastrointestinal:Present- Heartburn. Not Present- Bloody Stool, Abdominal Pain, Vomiting, Nausea and Incontinence of Stool. Male Genitourinary:Present- Frequency. Not Present- Blood in Urine, Incontinence and Nocturia. Musculoskeletal:Present- Joint Swelling and Back Pain. Not Present- Muscle Weakness, Muscle Pain, Joint Stiffness and Joint Pain. Neurological:Not Present- Tingling, Numbness, Burning, Tremor, Headaches and Dizziness. Psychiatric:Not Present- Anxiety, Depression and Memory Loss. Endocrine:Not Present- Cold Intolerance, Heat Intolerance, Excessive hunger and Excessive Thirst. Hematology:Present- Easy Bruising. Not Present- Abnormal Bleeding, Anemia and Blood Clots.  Vitals 04/19/2011 4:20 PM Weight: 177 lb Height: 67 in Weight was reported by patient. Height was reported by patient. Body Surface Area: 1.95 m Body Mass Index: 27.72 kg/m Pulse: 54 (Regular) Resp.: 12 (Unlabored) BP: 150/66 (Sitting, Right Arm, Standard)  Physical Exam GENERAL: Patient is a 70 y.o. male, well-nourished, well-developed, no acute distress. Alert, oriented, cooperative. Good Historian HENT:   Normocephalic, atraumatic. Pupils round and reactive. EOMs intact. NECK:  Supple. Bilateral bruits  - right greater than left CHEST:  Clear to anterior and posterior chest walls. No rhonchi, rales, wheezes. HEART:  Regular, rate and rhythm.  Systolic ejection murmur grade III/VI best heard over pulmonic point and along left sternal border, faint over aortic.  S1 and S2 noted. ABDOMEN:  Soft, nontender, bowel sounds present. Soft ventral hernia is present. RECTAL/BREAST/GENITALIA:  Not done, not pertinent to present   illness. EXTREMITIES:  Right knee - no effusion, marked crepitus, range of motion 5 -120 degrees, tender more medial that lateral, no instability.  RADIOGRAPHS Radiographs, AP both knees and lateral, show the unicompartmental replacement on the left to be in good position with no abnormalities. On the right he has advanced endstage arthritis of the knee.  Assessment & Plan Osteoarthritis, Knee (715.96) Impression: Right Knee  Patient to undergo a Right Total Knee Replacement. Surgery to be performed by Dr. [name]. Risks and benefits of the surgery have been discussed with the patient and they elect to proceed with surgery.  There are on active contraindications to upcoming procedure such as ongoing infection or progressive neurological disease. Plans for home after hospital stay.    

## 2011-04-29 NOTE — Anesthesia Postprocedure Evaluation (Signed)
  Anesthesia Post-op Note  Patient: [name] [name]  Procedure(s) Performed:  TOTAL KNEE ARTHROPLASTY  Patient Location: PACU  Anesthesia Type: Spinal  Level of Consciousness: awake and alert   Airway and Oxygen Therapy: Patient Spontanous Breathing  Post-op Pain: mild  Post-op Assessment: Post-op Vital signs reviewed, Patient's Cardiovascular Status Stable, Respiratory Function Stable, Patent Airway and No signs of Nausea or vomiting  Post-op Vital Signs: stable  Complications: No apparent anesthesia complications

## 2011-04-29 NOTE — Transfer of Care (Signed)
Immediate Anesthesia Transfer of Care Note  Patient: [name] [name]  Procedure(s) Performed:  TOTAL KNEE ARTHROPLASTY  Patient Location: PACU  Anesthesia Type: GA combined with regional for post-op pain  Level of Consciousness: awake  Airway & Oxygen Therapy: Patient Spontanous Breathing and Patient connected to face mask oxygen  Post-op Assessment: Report given to PACU RN and Post -op Vital signs reviewed and stable  Post vital signs: Reviewed and stable  Complications: No apparent anesthesia complications

## 2011-04-29 NOTE — Progress Notes (Signed)
Utilization review completed.  

## 2011-04-29 NOTE — Interval H&P Note (Signed)
History and Physical Interval Note:   04/29/2011   10:33 AM   [name] [name]  has presented today for surgery, with the diagnosis of osteoarthritis right knee   The various methods of treatment have been discussed with the patient and family. After consideration of risks, benefits and other options for treatment, the patient has consented to  Procedure(s): TOTAL KNEE ARTHROPLASTY as a surgical intervention .  The patients' history has been reviewed, patient examined, no change in status, stable for surgery.  I have reviewed the patients' chart and labs.  Questions were answered to the patient's satisfaction.     Loanne Drilling  MD  Pt examined. History and physical unchanged  [name]. Lonita Debes, MD    04/29/2011, 10:34 AM

## 2011-04-29 NOTE — Anesthesia Procedure Notes (Addendum)
Anesthesia Regional Block:  Femoral nerve block  Pre-Anesthetic Checklist: ,, timeout performed, Correct Patient, Correct Site, Correct Laterality, Correct Procedure, Correct Position, site marked, Risks and benefits discussed,  Surgical consent,  Pre-op evaluation,  At surgeon's request and post-op pain management  Laterality: Right  Prep: chloraprep       Needles:  Injection technique: Single-shot  Needle Type: Stimiplex     Needle Length: 10cm 10 cm     Additional Needles:  Procedures: ultrasound guided and nerve stimulator Femoral nerve block Narrative:  Injection made incrementally with aspirations every 5 mL.  Performed by: Personally   Additional Notes: Patient tolerated the procedure well without complications  Femoral nerve block Procedure Name: Intubation Date/Time: 04/29/2011 11:26 AM Performed by: Lurlean Leyden, Liyat Faulkenberry L. Patient Re-evaluated:Patient Re-evaluated prior to inductionOxygen Delivery Method: Circle System Utilized Preoxygenation: Pre-oxygenation with 100% oxygen Intubation Type: IV induction Ventilation: Mask ventilation without difficulty Laryngoscope Size: Miller and 3 Grade View: Grade II Tube type: Oral Tube size: 8.0 mm Number of attempts: 1 Airway Equipment and Method: stylet Placement Confirmation: ETT inserted through vocal cords under direct vision,  breath sounds checked- equal and bilateral and positive ETCO2 Secured at: 22 cm Tube secured with: Tape Dental Injury: Teeth and Oropharynx as per pre-operative assessment

## 2011-04-29 NOTE — Consult Note (Signed)
HPI: 70 year old male past medical history of coronary artery disease status post coronary artery bypass and graft, abdominal aortic aneurysm repair, hypertension, hyperlipidemia and peripheral vascular disease now status post knee replacement for evaluation of coronary disease. Patient's last echocardiogram in June of 2012 showed normal LV function. There was grade 1 diastolic dysfunction. There was mild left atrial enlargement. Prior to admission the patient noted dyspnea on exertion but attributes this to deconditioning secondary to knee pain. No orthopnea, PND, pedal edema, palpitations, syncope or exertional chest pain. We were asked to follow postoperatively for his history of coronary disease.  Medications Prior to Admission  Medication Dose Route Frequency Provider Last Rate Last Dose  . 0.9 % NaCl with KCl 20 mEq/ L  infusion   Intravenous Continuous [name] [name]      . acetaminophen (OFIRMEV) IVPB 1,000 mg  1,000 mg Intravenous Q6H Frank V [name]      . acetaminophen (TYLENOL) tablet 650 mg  650 mg Oral Q6H PRN [name] [name]       Or  . acetaminophen (TYLENOL) suppository 650 mg  650 mg Rectal Q6H PRN [name] [name]      . bisacodyl (DULCOLAX) EC tablet 10 mg  10 mg Oral Daily PRN [name] [name]       Or  . bisacodyl (DULCOLAX) suppository 10 mg  10 mg Rectal Daily PRN [name] [name]      . bupivacaine ON-Q pain pump   Other Continuous [name] [name]      . ceFAZolin (ANCEF) IVPB 1 g/50 mL premix  1 g Intravenous Once [name] [name]   2 g at 04/29/11 1130  . ceFAZolin (ANCEF) IVPB 1 g/50 mL premix  1 g Intravenous Q6H Frank V [name]      . diphenhydrAMINE (BENADRYL) injection 12.5 mg  12.5 mg Intravenous Q6H PRN [name] [name]       Or  . diphenhydrAMINE (BENADRYL) 12.5 MG/5ML elixir 12.5 mg  12.5 mg Oral Q6H PRN [name] [name]      . diphenhydrAMINE (BENADRYL) 12.5 MG/5ML elixir 12.5-25 mg  12.5-25 mg Oral Q4H PRN [name] [name]      . docusate sodium (COLACE) capsule 100  mg  100 mg Oral BID [name] [name]      . fentaNYL (SUBLIMAZE) injection 100 mcg  100 mcg Intravenous PRN Phillips Grout, MD   100 mcg at 04/29/11 1116  . levothyroxine (SYNTHROID, LEVOTHROID) tablet 112 mcg  112 mcg Oral QAM Frank V [name]      . magnesium hydroxide (MILK OF MAGNESIA) suspension 30 mL  30 mL Oral Q12H PRN [name] [name]      . menthol-cetylpyridinium (CEPACOL) lozenge 3 mg  1 lozenge Oral PRN [name] [name]       Or  . phenol (CHLORASEPTIC) mouth spray 1 spray  1 spray Mouth/Throat PRN [name] [name]      . methocarbamol (ROBAXIN) tablet 500 mg  500 mg Oral Q6H PRN [name] [name]       Or  . methocarbamol (ROBAXIN) 500 mg in dextrose 5 % 50 mL IVPB  500 mg Intravenous Q6H PRN Homero Fellers V [name]   500 mg at 04/29/11 1350  . metoCLOPramide (REGLAN) tablet 5-10 mg  5-10 mg Oral Q8H PRN [name] [name]       Or  . metoCLOPramide (REGLAN) injection 5-10 mg  5-10 mg Intravenous Q8H PRN [name] [name]      . metoprolol  tartrate (LOPRESSOR) tablet 25 mg  25 mg Oral BID [name] [name]      . morphine 1 MG/ML PCA injection   Intravenous Q4H Frank V [name]      . naloxone [facility]) injection 0.4 mg  0.4 mg Intravenous PRN [name] [name]       And  . sodium chloride 0.9 % injection 9 mL  9 mL Intravenous PRN [name] [name]      . niacin tablet 500 mg  500 mg Oral QHS [name] [name]      . nitroGLYCERIN (NITROSTAT) SL tablet 0.4 mg  0.4 mg Sublingual Q5 min PRN [name] [name]      . ondansetron (ZOFRAN) tablet 4 mg  4 mg Oral Q6H PRN [name] [name]       Or  . ondansetron (ZOFRAN) injection 4 mg  4 mg Intravenous Q6H PRN [name] [name]      . ondansetron (ZOFRAN) injection 4 mg  4 mg Intravenous Q6H PRN [name] [name]      . oxyCODONE (Oxy IR/ROXICODONE) immediate release tablet 5-10 mg  5-10 mg Oral Q3H PRN [name] [name]      . pantoprazole (PROTONIX) EC tablet 20 mg  20 mg Oral Q1200 [name] [name]      . polyethylene glycol (MIRALAX / GLYCOLAX) packet 17 g  17 g Oral  Daily PRN [name] [name]      . rivaroxaban (XARELTO) tablet 10 mg  10 mg Oral Q24H [name] [name]      . rosuvastatin (CRESTOR) tablet 20 mg  20 mg Oral q1800 [name] [name]      . sodium phosphate (FLEET) 7-19 GM/118ML enema 1 enema  1 enema Rectal Daily PRN [name] [name]      . triamterene-hydrochlorothiazide (DYAZIDE) 37.5-25 MG per capsule 1 each  1 each Oral Daily [name] [name]      . DISCONTD: bupivacaine 0.25 % ON-Q pump SINGLE CATH 300 mL  300 mL Other Continuous Frank V [name]   300 mL at 04/29/11 1153  . DISCONTD: fentaNYL (SUBLIMAZE) injection 25-50 mcg  25-50 mcg Intravenous Q5 min PRN Phillips Grout, MD   50 mcg at 04/29/11 1348  . DISCONTD: lactated ringers infusion   Intravenous Continuous Phillips Grout, MD 100 mL/hr at 04/29/11 1351    . DISCONTD: meperidine (DEMEROL) injection 6.25-12.5 mg  6.25-12.5 mg Intravenous PRN Phillips Grout, MD      . DISCONTD: morphine 10 MG/ML injection           . DISCONTD: ondansetron (ZOFRAN) injection 4 mg  4 mg Intravenous Once PRN Phillips Grout, MD      . DISCONTD: sodium chloride 0.9 % irrigation    PRN [name] [name]   1,000 mL at 04/29/11 1151   Medications Prior to Admission  Medication Sig Dispense Refill  . Coenzyme Q10 (COQ-10) 100 MG CAPS Take 300 mg elemental calcium/kg/hr by mouth. 3 TABS DAILY      . [employer] OMEGA-3 KRILL OIL PO Take by mouth.        . Flaxseed, Linseed, (RA FLAX SEED OIL 1000) 1000 MG CAPS Take by mouth. 2 TABS DAILY        . lansoprazole (PREVACID) 15 MG capsule Take 15 mg by mouth daily.       [name] levothyroxine (SYNTHROID, LEVOTHROID) 112 MCG tablet Take 112 mcg by mouth every morning.       . niacin 500 MG tablet Take  500 mg by mouth. 4 TABS AT BEDTIME       . rosuvastatin (CRESTOR) 20 MG tablet Take 20 mg by mouth daily.       [name] aspirin 325 MG tablet Take 325 mg by mouth daily.       . clopidogrel (PLAVIX) 75 MG tablet Take 75 mg by mouth daily.       . nitroGLYCERIN (NITROSTAT) 0.4 MG SL tablet Place  1 tablet (0.4 mg total) under the tongue every 5 (five) minutes as needed for chest pain.  25 tablet  10  . triamterene-hydrochlorothiazide (DYAZIDE) 37.5-25 MG per capsule Take 1 each (1 capsule total) by mouth every morning.  30 capsule  8    No Known Allergies  Past Medical History  Diagnosis Date  . HYPOTHYROIDISM   . Pure hypercholesterolemia   . HYPERTENSION   . GERD   . DIVERTICULOSIS OF COLON   . JOINT EFFUSION, KNEE   . OSTEOPOROSIS   . Other dysphagia   . NEPHROLITHIASIS, HX OF   . Myocardial infarction     1994   . CORONARY ARTERY DISEASE   . Atrial fibrillation     no hx of reported at preop visit of 04/21/11   . PERIPHERAL VASCULAR DISEASE     AAA - 1994 with reimplant of renals   . Peripheral vascular disease     subclavian stenosis PTA - 3/08   . CHRONIC OBSTRUCTIVE PULMONARY DISEASE     pt denied at visit of 04/21/11   . Chronic kidney disease     AAA repair with reimplant of renals   . KNEE, ARTHRITIS, DEGEN./OSTEO     right knee     Past Surgical History  Procedure Date  . Abdominal aortic aneurysm repair     with reimplantation of renals   . Gallbladder surgery 2004   . Knee surgery   . Vascular surgery     AAA  . Cardiac catheterization     2008  . Coronary artery bypass graft     1995  . Joint replacement     partial knee replacement on left 2002   . Other surgical history     left subclavian stenosis surgery PTA 08/2006   . Other surgical history     carotid surgery on right 2004     History   Social History  . Marital Status: Married    Spouse Name: [name]    Number of Children: [name]  . Years of Education: [name]   Occupational History  . Not on file.   Social History Main Topics  . Smoking status: Former Smoker    Types: Cigarettes    Quit date: 06/20/1992  . Smokeless tobacco: Not on file  . Alcohol Use: No  . Drug Use: No  . Sexually Active: Not on file   Other Topics Concern  . Not on file   Social History Narrative  . No  narrative on file    History reviewed. No pertinent family history.  ROS: Knee arthralgias but  no fevers or chills, productive cough, hemoptysis, dysphasia, odynophagia, melena, hematochezia, dysuria, hematuria, rash, seizure activity, orthopnea, PND, pedal edema, claudication. Remaining systems are negative.  Physical Exam:  Blood pressure 153/69, pulse 61, temperature 98.2 F (36.8 C), temperature source Oral, resp. rate 18, height 5' 7.5" (1.715 m), weight 173 lb (78.472 kg), SpO2 98.00%.  General:  Well developed/well nourished in NAD Skin warm/dry Patient not depressed No peripheral clubbing Back-not assessed. HEENT-normal/normal  eyelids Neck supple/normal carotid upstroke bilaterally; bilateral bruits; no JVD; no thyromegaly chest - CTA/ normal expansion CV - RRR/normal S1 and S2; no rubs or gallops;  PMI nondisplaced; 2/6 systolic murmur LSB Abdomen -NT/ND, no HSM, no mass, + bowel sounds, no bruit 2+ femoral pulses, bilateral bruits Ext-no edema, s/p knee replacement. Neuro-grossly nonfocal  ECG 04/21/11-sinus bradycardia at a rate of 44. Right bundle branch block. Inferior infarct. Nonspecific T-wave changes.  Results for orders placed during the hospital encounter of 04/29/11 (from the past 48 hour(s))  TYPE AND SCREEN     Status: Normal   Collection Time   04/29/11  9:25 AM      Component Value Range Comment   ABO/RH(D) O POS      Antibody Screen NEG      Sample Expiration 05/02/2011     ABO/RH     Status: Normal   Collection Time   04/29/11  9:30 AM      Component Value Range Comment   ABO/RH(D) O POS       No results found.  Assessment/Plan Patient Active Hospital Problem List: #1 coronary artery disease-continue beta blocker and statin. Will leave off of aspirin while patient is on xeralto. No recent chest pain.  #2 hypertension-continue present medications. Follow blood pressure and increase medications as needed. Note his metoprolol was recently decreased  from 75 mg by mouth twice a day to 25 mg by mouth twice a day secondary to bradycardia. #3 hyperlipidemia-continue statin. #4 status post knee replacement-management per orthopedic surgery.  #5 peripheral vascular disease-patient will followup with Dr. [name] for further evaluation in the future. Continue statin.     Olga Millers MD 04/29/2011, 6:05 PM

## 2011-04-29 NOTE — Anesthesia Preprocedure Evaluation (Signed)
Anesthesia Evaluation  Patient identified by MRN, date of birth, ID band Patient awake    Reviewed: Allergy & Precautions, H&P , NPO status , Patient's Chart, lab work & pertinent test results, reviewed documented beta blocker date and time   Airway Mallampati: II TM Distance: >3 FB Neck ROM: Full    Dental No notable dental hx. (+) Edentulous Upper and Edentulous Lower   Pulmonary neg pulmonary ROS, COPD clear to auscultation  Pulmonary exam normal       Cardiovascular hypertension, Pt. on medications + CAD (s/p cabg x 5 in 1995.  s/p stents x 2. asymptomatic now), + Past MI and neg cardio ROS Regular Normal    Neuro/Psych Negative Neurological ROS  Negative Psych ROS   GI/Hepatic negative GI ROS, Neg liver ROS, GERD-  Medicated and Controlled,  Endo/Other  Negative Endocrine ROSHypothyroidism   Renal/GU negative Renal ROS  Genitourinary negative   Musculoskeletal negative musculoskeletal ROS (+)   Abdominal   Peds negative pediatric ROS (+)  Hematology negative hematology ROS (+)   Anesthesia Other Findings   Reproductive/Obstetrics negative OB ROS                           Anesthesia Physical Anesthesia Plan  ASA: II  Anesthesia Plan: General   Post-op Pain Management:    Induction: Intravenous  Airway Management Planned: Oral ETT  Additional Equipment:   Intra-op Plan:   Post-operative Plan: Extubation in OR  Informed Consent: I have reviewed the patients History and Physical, chart, labs and discussed the procedure including the risks, benefits and alternatives for the proposed anesthesia with the patient or authorized representative who has indicated his/her understanding and acceptance.   Dental advisory given  Plan Discussed with: CRNA  Anesthesia Plan Comments:         Anesthesia Quick Evaluation

## 2011-04-29 NOTE — Progress Notes (Signed)
Orthopedic Tech Progress Note Patient Details:  [name] [name] 04-25-1941 [id number]  CPM Right Knee CPM Right Knee: On Right Knee Flexion (Degrees): 40  Right Knee Extension (Degrees): 5    Tawni Carnes [facility] 04/29/2011, 4:41 PM

## 2011-04-29 NOTE — Op Note (Signed)
Pre-operative diagnosis- Osteoarthritis  Right knee(s)  Post-operative diagnosis- Osteoarthritis Right knee(s)  Surgeon- [name]. John Williamsen, MD  Assistant- Avel Peace, PA-C   Anesthesia-  General EBL-* No blood loss amount entered *  Drains Hemovac  Tourniquet time-  Total Tourniquet Time Documented: Thigh (Right) - 30 minutes   Complications- None  Condition-PACU - hemodynamically stable.   Brief Clinical Note   [name] [name] is a 70 y.o. year old male with end stage OA of his right knee with progressively worsening pain and dysfunction. He has constant pain, with activity and at rest and significant functional deficits with difficulties even with ADLs. He has had extensive non-op management including analgesics, injections of cortisone and viscosupplements, and home exercise program, but remains in significant pain with significant dysfunction. Radiographically, he has bone on bone changes of his medial and patelllofemoral compartments, significant varus deformity and large marginal osteophytes. He presents now for left Total Knee Arthroplasty.    Procedure in detail---   The patient is brought into the operating room and positioned supine on the operating table. After successful administration of  General,   a tourniquet is placed high on the  Right thigh(s) and the lower extremity is prepped and draped in the usual sterile fashion. Time out is performed by the operating team and then the  Right lower extremity is wrapped in Esmarch, knee flexed and the tourniquet inflated to 300 mmHg.       A midline incision is made with a ten blade through the subcutaneous tissue to the level of the extensor mechanism. A fresh blade is used to make a medial parapatellar arthrotomy. Soft tissue over the proximal medial tibia is subperiosteally elevated to the joint line with a knife and into the semimembranosus bursa with a Cobb elevator. Soft tissue over the proximal lateral tibia is elevated with  attention being paid to avoiding the patellar tendon on the tibial tubercle. The patella is everted, knee flexed 90 degrees and the ACL and PCL are removed. Findings are bone on bone medial and patellofemoral with large osteophytes in both compartments.        The drill is used to create a starting hole in the distal femur and the canal is thoroughly irrigated with sterile saline to remove the fatty contents. The 5 degree Right  valgus alignment guide is placed into the femoral canal and the distal femoral cutting block is pinned to remove 11 mm off the distal femur. Resection is made with an oscillating saw.      The tibia is subluxed forward and the menisci are removed. The extramedullary alignment guide is placed referencing proximally at the medial aspect of the tibial tubercle and distally along the second metatarsal axis and tibial crest. The block is pinned to remove 2mm off the more deficient medial  side. Resection is made with an oscillating saw. Size 4is the most appropriate size for the tibia and the proximal tibia is prepared with the modular drill and keel punch for that size.      The femoral sizing guide is placed and size 4 is most appropriate. Rotation is marked off the epicondylar axis and confirmed by creating a rectangular flexion gap at 90 degrees. The size 4 cutting block is pinned in this rotation and the anterior, posterior and chamfer cuts are made with the oscillating saw. The intercondylar block is then placed and that cut is made.      Trial size 4 tibial component, trial size 4 posterior  stabilized femur and a 12.5  mm posterior stabilized rotating platform insert trial is placed. Full extension is achieved with excellent varus/valgus and anterior/posterior balance throughout full range of motion. The patella is everted and thickness measured to be 24  mm. Free hand resection is taken to 14 mm, a 38 template is placed, lug holes are drilled, trial patella is placed, and it tracks  normally. Osteophytes are removed off the posterior femur with the trial in place. All trials are removed and the cut bone surfaces prepared with pulsatile lavage. Cement is mixed and once ready for implantation, the size 4 tibial implant, size   4posterior stabilized femoral component, and the size 38 patella are cemented in place and the patella is held with the clamp. The trial insert is placed and the knee held in full extension. All extruded cement is removed and once the cement is hard the permanent 12.5 mm posterior stabilized rotating platform insert is placed into the tibial tray.      The wound is copiously irrigated with saline solution and the extensor mechanism closed over a hemovac drain with #1 PDS suture. The tourniquet is released for a total tourniquet time of 30  minutes. Flexion against gravity is 140 degrees and the patella tracks normally. Subcutaneous tissue is closed with 2.0 vicryl and subcuticular with running 4.0 Monocryl. The catheter for the Marcaine pain pump is placed and the pump is initiated. The incision is cleaned and dried and steri-strips and a bulky sterile dressing are applied. The limb is placed into a knee immobilizer and the patient is awakened and transported to recovery in stable condition.      Please note that a surgical assistant was a medical necessity for this procedure in order to perform it in a safe and expeditious manner. Surgical assistant was necessary to retract the ligaments and vital neurovascular structures to prevent injury to them and also necessary for proper positioning of the limb to allow for anatomic placement of the prosthesis.  [name] Krithi Bray, MD    04/29/2011, 12:41 PM

## 2011-04-30 LAB — CBC
HCT: 32.8 % — ABNORMAL LOW (ref 39.0–52.0)
MCHC: 35.4 g/dL (ref 30.0–36.0)
MCV: 87.2 fL (ref 78.0–100.0)
RDW: 13.2 % (ref 11.5–15.5)

## 2011-04-30 LAB — BASIC METABOLIC PANEL
BUN: 18 mg/dL (ref 6–23)
Calcium: 8.8 mg/dL (ref 8.4–10.5)
Creatinine, Ser: 0.86 mg/dL (ref 0.50–1.35)
GFR calc Af Amer: >90 mL/min (ref >90)
GFR calc non Af Amer: 85 mL/min — ABNORMAL LOW (ref >90)

## 2011-04-30 NOTE — Progress Notes (Signed)
Subjective: No chest pain or SOB   Physical Exam:  Blood pressure 160/71, pulse 63, temperature 97.6 F (36.4 C), temperature source Oral, resp. rate 16, height 5' 7.5" (1.715 m), weight 173 lb (78.472 kg), SpO2 100.00%. Temp:  [96.9 F (36.1 C)-98.4 F (36.9 C)] 97.6 F (36.4 C) (11/10 0517) Pulse Rate:  [51-80] 63  (11/10 0517) Resp:  [10-24] 16  (11/10 0517) BP: (136-180)/(50-119) 160/71 mmHg (11/10 0517) SpO2:  [94 %-100 %] 100 % (11/10 0517) Weight:  [173 lb (78.472 kg)] 173 lb (78.472 kg) (11/09 1642) Weight change:  I/O 11/09 0701 - 11/10 0700 In: 4055 [P.O.:1080; I.V.:2525; IV Piggyback:450] Out: 1870 [Urine:1850; Drains:20]  HEENT-normal/normal eyelids Neck supple chest - CTA/ normal expansion CV - RRR/normal S1 and S2; no rubs or gallops; 2/6 systolic murmur Abdomen -NT/ND, no HSM, no mass, + bowel sounds, no bruit 2+ femoral pulses, no bruits Ext-no edema, chords, 2+ DP; s/p knee replacement Neuro-grossly nonfocal  Results for orders placed during the hospital encounter of 04/29/11 (from the past 48 hour(s))  TYPE AND SCREEN     Status: Normal   Collection Time   04/29/11  9:25 AM      Component Value Range Comment   ABO/RH(D) O POS      Antibody Screen NEG      Sample Expiration 05/02/2011     ABO/RH     Status: Normal   Collection Time   04/29/11  9:30 AM      Component Value Range Comment   ABO/RH(D) O POS     CBC     Status: Abnormal   Collection Time   04/30/11  5:25 AM      Component Value Range Comment   WBC 13.2 (*) 4.0 - 10.5 (K/uL)    RBC 3.76 (*) 4.22 - 5.81 (MIL/uL)    Hemoglobin 11.6 (*) 13.0 - 17.0 (g/dL)    HCT 16.1 (*) 09.6 - 52.0 (%)    MCV 87.2  78.0 - 100.0 (fL)    MCH 30.9  26.0 - 34.0 (pg)    MCHC 35.4  30.0 - 36.0 (g/dL)    RDW 04.5  40.9 - 81.1 (%)    Platelets 151  150 - 400 (K/uL)   BASIC METABOLIC PANEL     Status: Abnormal   Collection Time   04/30/11  5:25 AM      Component Value Range Comment   Sodium 135  135 - 145  (mEq/L)    Potassium 4.6  3.5 - 5.1 (mEq/L)    Chloride 103  96 - 112 (mEq/L)    CO2 26  19 - 32 (mEq/L)    Glucose, Bld 128 (*) 70 - 99 (mg/dL)    BUN 18  6 - 23 (mg/dL)    Creatinine, Ser 9.14  0.50 - 1.35 (mg/dL)    Calcium 8.8  8.4 - 10.5 (mg/dL)    GFR calc non Af Amer 85 (*) >90 (mL/min)    GFR calc Af Amer >90  >90 (mL/min)     No results found.  Assessment/Plan Patient Active Hospital Problem List:  #1 coronary artery disease-continue beta blocker and statin. Will leave off of aspirin while patient is on xeralto. No chest pain. Patient can be transferred to nontelemetry bed from cardiac standpoint. We will see again as needed. FU with Dr. [name] following Dc. #2 hypertension-continue present medications. Blood pressure controlled on present meds. #3 hyperlipidemia-continue statin.  #4 status post knee replacement-management per orthopedic surgery.  #  5 peripheral vascular disease-patient will followup with Dr. [name] for further evaluation in the future. Continue statin.     Olga Millers MD 04/30/2011, 7:12 AM

## 2011-04-30 NOTE — Progress Notes (Signed)
Cm spoke with pt concerning d/c planning. Pt with wife at bedside state have home DME. Pt agress with HHPT. Pt given choice list for [facility].

## 2011-04-30 NOTE — Progress Notes (Signed)
  [name] [name]  MRN: [id number] DOB/Age: [birth year] 70 y.o. Physician: Lynnea Maizes, M.D. 1 Day Post-Op Procedure(s) (LRB): TOTAL KNEE ARTHROPLASTY (Right)  Subjective: Up in chair, minimal pain Vital Signs Temp:  [97.1 F (36.2 C)-98.4 F (36.9 C)] 97.6 F (36.4 C) (11/10 0517) Pulse Rate:  [51-80] 75  (11/10 1033) Resp:  [10-24] 18  (11/10 0800) BP: (133-180)/(50-119) 133/63 mmHg (11/10 1033) SpO2:  [95 %-100 %] 95 % (11/10 1033) Weight:  [78.472 kg (173 lb)] 173 lb (78.472 kg) (11/09 1642)  Lab Results  Basename 04/30/11 0525  WBC 13.2*  HGB 11.6*  HCT 32.8*  PLT 151   BMET  Basename 04/30/11 0525  NA 135  K 4.6  CL 103  CO2 26  GLUCOSE 128*  BUN 18  CREATININE 0.86  CALCIUM 8.8   INR  Date Value Range Status  04/21/2011 1.01  0.00-1.49 (no units) Final     Exam  hemovac dcd, dressing dry nvi.  Plan Dc pca, mobilize with pt [name] [name] M 04/30/2011, 10:53 AM

## 2011-04-30 NOTE — Progress Notes (Signed)
Physical Therapy Evaluation Patient Details Name: [name] [name] MRN: [id number] DOB: 31-Aug-1940 Today's Date: 04/30/2011 Time: 7829-5621  Eval II Problem List:  Patient Active Problem List  Diagnoses  . HYPOTHYROIDISM  . Pure Hypercholesterolemia  . HYPERTENSION  . CORONARY ARTERY DISEASE  . ATRIAL FIBRILLATION  . PERIPHERAL VASCULAR DISEASE  . CHRONIC OBSTRUCTIVE PULMONARY DISEASE  . GERD  . DIVERTICULOSIS OF COLON  . KNEE, ARTHRITIS, DEGEN./OSTEO  . JOINT EFFUSION, KNEE  . KNEE PAIN  . OSTEOPOROSIS  . OTHER DYSPHAGIA  . NEPHROLITHIASIS, HX OF  . Hx-Major Cardiovasc Surg  . CHOLECYSTECTOMY, HX OF  . CORONARY ARTERY BYPASS GRAFT, HX OF  . Bruit  . AAA (abdominal aortic aneurysm)    Past Medical History:  Past Medical History  Diagnosis Date  . HYPOTHYROIDISM   . Pure hypercholesterolemia   . HYPERTENSION   . GERD   . DIVERTICULOSIS OF COLON   . JOINT EFFUSION, KNEE   . OSTEOPOROSIS   . Other dysphagia   . NEPHROLITHIASIS, HX OF   . Myocardial infarction     1994   . CORONARY ARTERY DISEASE   . Atrial fibrillation     no hx of reported at preop visit of 04/21/11   . PERIPHERAL VASCULAR DISEASE     AAA - 1994 with reimplant of renals   . Peripheral vascular disease     subclavian stenosis PTA - 3/08   . CHRONIC OBSTRUCTIVE PULMONARY DISEASE     pt denied at visit of 04/21/11   . Chronic kidney disease     AAA repair with reimplant of renals   . KNEE, ARTHRITIS, DEGEN./OSTEO     right knee    Past Surgical History:  Past Surgical History  Procedure Date  . Abdominal aortic aneurysm repair     with reimplantation of renals   . Gallbladder surgery 2004   . Knee surgery   . Vascular surgery     AAA  . Cardiac catheterization     2008  . Coronary artery bypass graft     1995  . Joint replacement     partial knee replacement on left 2002   . Other surgical history     left subclavian stenosis surgery PTA 08/2006   . Other surgical history     carotid  surgery on right 2004     PT Assessment/Plan/Recommendation PT Assessment Clinical Impression Statement: Pt presents with diagnosis of right TKA. Pt will benefit from PT in the acute care setting to improve strength, activity tolerance,  and gait in order to maximize I with basic functional mobility in preperation for DC home with wife. PT Recommendation/Assessment: Patient will need skilled PT in the acute care venue PT Problem List: Decreased strength;Decreased range of motion;Decreased activity tolerance;Decreased knowledge of use of DME;Pain;Decreased knowledge of precautions PT Therapy Diagnosis : Difficulty walking;Abnormality of gait;Acute pain PT Plan PT Frequency: 7X/week PT Treatment/Interventions: DME instruction;Gait training;Stair training;Functional mobility training;Therapeutic exercise;Patient/family education PT Recommendation Recommendations for Other Services: OT consult Follow Up Recommendations: Home health PT Equipment Recommended: None recommended by PT PT Goals  Acute Rehab PT Goals PT Goal Formulation: With patient Pt will go Supine/Side to Sit: with supervision Pt will go Sit to Supine/Side: with supervision Pt will Ambulate: >150 feet;with supervision;with rolling walker Pt will Go Up / Down Stairs: 3-5 stairs;with least restrictive assistive device;with rail(s) (2-front, 3-side. 1 entrance has rail, 1 does not.) Pt will Perform Home Exercise Program: with supervision, verbal cues required/provided  PT  Evaluation Precautions/Restrictions  Precautions Precautions: Fall Required Braces or Orthoses: Yes Knee Immobilizer: On except when in CPM;Discontinue once straight leg raise with < 10 degree lag Restrictions Weight Bearing Restrictions: Yes RLE Weight Bearing: Weight bearing as tolerated Prior Functioning  Home Living Lives With: Spouse Receives Help From: Family Type of Home: House Home Layout: One level Home Access: Stairs to enter Entrance  Stairs-Rails: Right Entrance Stairs-Number of Steps: 3 Home Adaptive Equipment: Walker - rolling;Straight cane;Crutches;Bedside commode/3-in-1 Prior Function Level of Independence: Independent with basic ADLs Driving: Yes Cognition Cognition Arousal/Alertness: Awake/alert Overall Cognitive Status: Appears within functional limits for tasks assessed Orientation Level: Oriented X4 Sensation/Coordination   Extremity Assessment RLE Assessment RLE Assessment: Exceptions to [facility] RLE Strength RLE Overall Strength Comments: SLR 2/5, poor quad set. Moves ankle well. LLE Assessment LLE Assessment: Within Functional Limits Mobility (including Balance) Bed Mobility Bed Mobility: Yes Supine to Sit: 4: Min assist;HOB elevated (Comment degrees) Supine to Sit Details (indicate cue type and reason): VCs safety, technique. Assist for R LE Transfers Transfers: Yes Sit to Stand: 4: Min assist;From bed;With armrests Sit to Stand Details (indicate cue type and reason): VCs safety, technique, hand placement. Pt impulsive at times. Stand to Sit: 4: Min assist;To chair/3-in-1 Stand to Sit Details: VCs safety, technique, hand placement. Assist to control descent. Ambulation/Gait Ambulation/Gait: Yes Ambulation/Gait Assistance: 4: Min assist Ambulation/Gait Assistance Details (indicate cue type and reason): VCs safety, technique, sequence. Assist to stabilize intermittently. Pt demonstrated difficulty with sequencing. Ambulation Distance (Feet): 50 Feet Assistive device: Rolling walker Gait Pattern: Step-to pattern;Decreased stance time - right;Decreased weight shift to right;Antalgic  Posture/Postural Control Posture/Postural Control: No significant limitations Exercise    End of Session PT - End of Session Equipment Utilized During Treatment: Gait belt;Right knee immobilizer Activity Tolerance: Patient limited by pain Patient left: in chair;with call bell in reach;with family/visitor  present Nurse Communication: Mobility status for transfers;Mobility status for ambulation;Weight bearing status;Other (comment) (KI tightened prior to activity) General Behavior During Session: [facility] for tasks performed Cognition: [facility] for tasks performed  Rebeca Alert [facility] 04/30/2011, 10:47 AM

## 2011-04-30 NOTE — Progress Notes (Signed)
Physical Therapy Treatment Patient Details Name: [name] [name] MRN: [id number] DOB: 26-Sep-1940 Today's Date: 04/30/2011 Time: 1350-1421   1G, 1TE PT Assessment/Plan  PT - Assessment/Plan Comments on Treatment Session: Pt progressing well. Very motivated.  PT Plan: Discharge plan remains appropriate PT Frequency: 7X/week Follow Up Recommendations: Home health PT Equipment Recommended: None recommended by PT PT Goals  Acute Rehab PT Goals PT Goal: Supine/Side to Sit - Progress: Progressing toward goal PT Goal: Sit to Supine/Side - Progress: Progressing toward goal PT Goal: Ambulate - Progress: Progressing toward goal PT Goal: Perform Home Exercise Program - Progress: Progressing toward goal  PT Treatment Precautions/Restrictions  Precautions Precautions: Fall Required Braces or Orthoses: Yes Knee Immobilizer: On except when in CPM;Discontinue once straight leg raise with < 10 degree lag Restrictions Weight Bearing Restrictions: Yes RLE Weight Bearing: Weight bearing as tolerated Mobility (including Balance) Bed Mobility Bed Mobility: Yes Sit to Supine - Left: HOB flat;4: Min assist Sit to Supine - Left Details (indicate cue type and reason): VCs safety, technique. Assist for R LE Transfers Transfers: Yes Sit to Stand: From chair/3-in-1 (Min-guard) Sit to Stand Details (indicate cue type and reason): VCs safety, technique, hand placement. Pt stands quickly/impulsively. Stand to Sit: 4: Min assist Stand to Sit Details: VCs safety, technique, hand placement. Assist to control descent and slide R LE forward. Ambulation/Gait Ambulation/Gait: Yes Ambulation/Gait Assistance: 4: Min assist Ambulation/Gait Assistance Details (indicate cue type and reason): VCs safety, sequence. Assist to stabilize intermittently. Still some difficulty with sequencing initially. Towards end of walk, pt began to use reciprocal gait pattern. Ambulation Distance (Feet): 100 Feet Assistive device:  Rolling walker Gait Pattern: Antalgic;Decreased step length - right;Decreased stride length (Wide BOS)  Exercise  Total Joint Exercises Ankle Circles/Pumps: AROM;Both;10 reps Quad Sets: AROM;Right;10 reps Short Arc Quad: AROM;Right;10 reps Heel Slides: AAROM;Right;10 reps Hip ABduction/ADduction: AAROM;Right;10 reps Straight Leg Raises: AAROM;Right;10 reps End of Session PT - End of Session Equipment Utilized During Treatment: Gait belt;Right knee immobilizer Activity Tolerance: Patient tolerated treatment well Patient left: in bed;with call bell in reach;with family/visitor present Behavior During Session: [facility] for tasks performed Cognition: [facility] for tasks performed  Rebeca Alert [facility] 04/30/2011, 2:37 PM

## 2011-04-30 NOTE — Progress Notes (Signed)
Pt unable to void since foley d/c this am, bladder scan showed 410cc, MD aware see new order. [name] [name]

## 2011-05-01 LAB — URINE MICROSCOPIC-ADD ON

## 2011-05-01 LAB — CBC
MCHC: 34.9 g/dL (ref 30.0–36.0)
Platelets: 144 10*3/uL — ABNORMAL LOW (ref 150–400)
RDW: 13.9 % (ref 11.5–15.5)

## 2011-05-01 LAB — BASIC METABOLIC PANEL
BUN: 13 mg/dL (ref 6–23)
GFR calc Af Amer: >90 mL/min (ref >90)
GFR calc non Af Amer: 89 mL/min — ABNORMAL LOW (ref >90)
Potassium: 4.3 mEq/L (ref 3.5–5.1)
Sodium: 131 mEq/L — ABNORMAL LOW (ref 135–145)

## 2011-05-01 LAB — URINALYSIS, ROUTINE W REFLEX MICROSCOPIC
Bilirubin Urine: NEGATIVE
Nitrite: NEGATIVE
Specific Gravity, Urine: 1.015 (ref 1.005–1.030)
Urobilinogen, UA: 1 mg/dL (ref 0.0–1.0)

## 2011-05-01 NOTE — Progress Notes (Signed)
[name] [name] 70 y.o. 04/29/2011   Lab. Results:  Basename 05/01/11 0531 04/30/11 0525  WBC 10.4 13.2*  HGB 10.7* 11.6*  HCT 30.7* 32.8*  PLT 144* 151   BMET  Basename 05/01/11 0531 04/30/11 0525  NA 131* 135  K 4.3 4.6  CL 97 103  CO2 27 26  GLUCOSE 140* 128*  BUN 13 18  CREATININE 0.75 0.86  CALCIUM 8.9 8.8    INR  Date Value Range Status  04/21/2011 1.01  0.00-1.49 (no units) Final   VITALS Filed Vitals:   05/01/11 0550  BP: 156/65  Pulse: 65  Temp: 98.9 F (37.2 C)  Resp: 20     Subjective Patient doing well. No significant complaints  Objective Compartments soft/NT No SOB/CP Abd soft/NT Incision C/D/I 2+ DP/PT pulses EHL/TA/GA intact  Assessment/ Plan Difficulty voiding - required foley to be replaced.  Patient reports this is a common problem after surgery.   Will check UA to be sure not an infection Keep foley today Mobilization per TK protocal   [name] [name] D 11/11/20129:59 AM

## 2011-05-01 NOTE — Progress Notes (Signed)
Physical Therapy Treatment Patient Details Name: [name] [name] MRN: [id number] DOB: 1941/02/16 Today's Date: 05/01/2011  PT Assessment/Plan  PT - Assessment/Plan Comments on Treatment Session: Better gait session, Pt still with pain, decreased ROM and strengh. He will likely need 2 more days of PT before D/C to home with wife and HHPT PT Plan: Discharge plan remains appropriate;Frequency remains appropriate PT Goals  Acute Rehab PT Goals PT Goal: Supine/Side to Sit - Progress: Progressing toward goal PT Goal: Sit to Supine/Side - Progress: Progressing toward goal PT Goal: Ambulate - Progress: Progressing toward goal PT Goal: Perform Home Exercise Program - Progress: Progressing toward goal  PT Treatment Precautions/Restrictions  Precautions Precautions: Knee Required Braces or Orthoses: Yes Knee Immobilizer: On except when in CPM;Discontinue once straight leg raise with < 10 degree lag Restrictions Weight Bearing Restrictions: Yes RLE Weight Bearing: Weight bearing as tolerated Mobility (including Balance) Bed Mobility  Sit to Supine - Left: 4: Min assist Sit to Supine - Left Details (indicate cue type and reason): assist to lift leg Transfers Transfers: Yes Sit to Stand: 4: Min assist Sit to Stand Details (indicate cue type and reason): cues for hand placement and for safety Stand to Sit: 4: Min assist Stand to Sit Details: cues to reach back with hands,slide right foot out and sit back far enough on bed Ambulation/Gait Ambulation/Gait: Yes Ambulation/Gait Assistance: 4: Min assist Ambulation/Gait Assistance Details (indicate cue type and reason): better sequence and speed this session Ambulation Distance (Feet): 100 Feet Assistive device: Rolling walker Gait Pattern:  (better speed and pattern, knee immoblizer intact) Gait velocity: better   Exercise  Total Joint Exercises Ankle Circles/Pumps: AROM;Right;10 reps Quad Sets: AROM;Right (improving terminal  extension)  Short Arc Quad: AROM;Right;Other (comment) (unable to complete because of pain after walking) End of Session PT - End of Session Equipment Utilized During Treatment: Gait belt;Left knee immobilizer (rolling walker) Activity Tolerance: Patient tolerated treatment well Patient left: in bed;with call bell in reach Nurse Communication:  (requested pain medication prior to application of CPM) General Behavior During Session: [facility] for tasks performed Cognition: [facility] for tasks performed  Donnetta Hail 05/01/2011, 4:01 PM

## 2011-05-01 NOTE — Progress Notes (Signed)
Physical Therapy Treatment Patient Details Name: [name] [name] MRN: [id number] DOB: January 23, 1941 Today's Date: 05/01/2011  PT Assessment/Plan  PT - Assessment/Plan Comments on Treatment Session: progressing well with flexion  ROM , needs continued work on termimal extension and mobility PT Plan: Discharge plan remains appropriate;Frequency remains appropriate PT Goals  Acute Rehab PT Goals PT Goal: Supine/Side to Sit - Progress: Progressing toward goal PT Goal: Sit to Supine/Side - Progress: Progressing toward goal PT Goal: Ambulate - Progress: Progressing toward goal PT Goal: Perform Home Exercise Program - Progress: Progressing toward goal  PT Treatment Precautions/Restrictions  Precautions Precautions: Knee Required Braces or Orthoses: Yes Knee Immobilizer: On except when in CPM;Discontinue once straight leg raise with < 10 degree lag Restrictions Weight Bearing Restrictions: Yes RLE Weight Bearing: Weight bearing as tolerated Mobility (including Balance) Bed Mobility Supine to Sit: 4: Min assist Supine to Sit Details (indicate cue type and reason): pt educated to push up with left leg to scoot to edge of bed Sit to Supine - Left: 4: Min assist;HOB flat Sit to Supine - Left Details (indicate cue type and reason): assist to lift right leg Transfers Transfers: Yes Sit to Stand: 4: Min assist Stand to Sit: 4: Min assist Ambulation/Gait Ambulation/Gait: Yes Ambulation/Gait Assistance: 4: Min assist Ambulation/Gait Assistance Details (indicate cue type and reason): pt asked to have shoes applied, Gait slow, difficutly with sequence posture, pt stopped frequently, c/o nausea Ambulation Distance (Feet): 50 Feet (limited by nausea, lightheadedness, chair pulled up) Assistive device: Rolling walker Gait Pattern: Step-to pattern;Decreased stance time - right;Decreased step length - right (knee immoblizer intact) Gait velocity: slow  Posture/Postural Control Posture/Postural  Control:  (adjusted walker height for more trunk extension) Exercise  Total Joint Exercises Ankle Circles/Pumps: AROM;Both;10 reps;Supine Quad Sets: AROM;Right;20 reps;Supine (pt lacks terminal extension by about 15 degrees) Gluteal Sets: AROM;Both;5 reps;Standing Short Arc Quad: AAROM;Right;Supine;10 reps Heel Slides: AAROM;Right;10 reps;Supine Straight Leg Raises: AAROM;Right;5 reps Long Arc Quad: AAROM;Right;10 reps;Seated Knee Flexion: AROM;Right;10 reps;Seated Other Exercises Other Exercises: pregait: right stepping and weight shift x 5 reps End of Session PT - End of Session Equipment Utilized During Treatment:  (pt with IV, foley) Activity Tolerance: Patient limited by fatigue (limited by nausea, lightheadedness) Patient left: in chair;with family/visitor present Nurse Communication: Mobility status for ambulation General Behavior During Session: [facility] for tasks performed Cognition: [facility] for tasks performed  Donnetta Hail 05/01/2011, 2:14 PM

## 2011-05-02 LAB — CBC
Hemoglobin: 10.5 g/dL — ABNORMAL LOW (ref 13.0–17.0)
MCH: 31.1 pg (ref 26.0–34.0)
MCV: 88.8 fL (ref 78.0–100.0)
Platelets: 138 10*3/uL — ABNORMAL LOW (ref 150–400)
RBC: 3.38 MIL/uL — ABNORMAL LOW (ref 4.22–5.81)

## 2011-05-02 MED ORDER — TAMSULOSIN HCL 0.4 MG PO CAPS
0.4000 mg | ORAL_CAPSULE | ORAL | Status: DC
  Administered 2011-05-02: 0.4 mg via ORAL
  Filled 2011-05-02 (×2): qty 1

## 2011-05-02 NOTE — Progress Notes (Signed)
Subjective: 3 Days Post-Op Procedure(s) (LRB): TOTAL KNEE ARTHROPLASTY (Right) Patient reports pain as mild.   Patient seen in rounds with Dr. [name]. Patient has complaints of inability to void resulting in catheter needing to be reinserted yesterday  Objective: Vital signs in last 24 hours: Temp:  [98.1 F (36.7 C)-98.5 F (36.9 C)] 98.1 F (36.7 C) (11/12 1300) Pulse Rate:  [66-75] 75  (11/12 1300) Resp:  [18-20] 20  (11/12 1300) BP: (149-164)/(53-68) 149/53 mmHg (11/12 1300) SpO2:  [95 %-100 %] 100 % (11/12 1300)  Intake/Output from previous day:  Intake/Output Summary (Last 24 hours) at 05/02/11 1610 Last data filed at 05/02/11 1600  Gross per 24 hour  Intake 3192.5 ml  Output   4600 ml  Net -1407.5 ml    Intake/Output this shift: Total I/O In: -  Out: 675 [Urine:675]   Basename 05/02/11 0535 05/01/11 0531 04/30/11 0525  HGB 10.5* 10.7* 11.6*    Basename 05/02/11 0535 05/01/11 0531  WBC 9.2 10.4  RBC 3.38* 3.50*  HCT 30.0* 30.7*  PLT 138* 144*    Basename 05/01/11 0531 04/30/11 0525  NA 131* 135  K 4.3 4.6  CL 97 103  CO2 27 26  BUN 13 18  CREATININE 0.75 0.86  GLUCOSE 140* 128*  CALCIUM 8.9 8.8   No results found for this basename: LABPT:2,INR:2 in the last 72 hours  Exam - Neurologically intact Neurovascular intact Incision: no drainage Dressing/Incision - clean, dry Motor function intact - moving foot and toes well on exam.  No calf tenderness or swelling  Assessment/Plan: 3 Days Post-Op Procedure(s) (LRB): TOTAL KNEE ARTHROPLASTY (Right)  Continue PT D/C Foley Probable discharge home tomorrow if voiding.       Past Medical History  Diagnosis Date  . HYPOTHYROIDISM   . Pure hypercholesterolemia   . HYPERTENSION   . GERD   . DIVERTICULOSIS OF COLON   . JOINT EFFUSION, KNEE   . OSTEOPOROSIS   . Other dysphagia   . NEPHROLITHIASIS, HX OF   . Myocardial infarction     1994   . CORONARY ARTERY DISEASE   . Atrial fibrillation      no hx of reported at preop visit of 04/21/11   . PERIPHERAL VASCULAR DISEASE     AAA - 1994 with reimplant of renals   . Peripheral vascular disease     subclavian stenosis PTA - 3/08   . CHRONIC OBSTRUCTIVE PULMONARY DISEASE     pt denied at visit of 04/21/11   . Chronic kidney disease     AAA repair with reimplant of renals   . KNEE, ARTHRITIS, DEGEN./OSTEO     right knee     DVT Prophylaxis - Xarelto  Protocol Weight-Bearing as tolerated to right leg  Teja Judice V 05/02/2011, 4:10 PM

## 2011-05-02 NOTE — Progress Notes (Signed)
Physical Therapy Treatment Patient Details Name: [name] [name] MRN: [id number] DOB: 12-11-1940 Today's Date: 05/02/2011 14:45 - 15:15 1 gt  1 te PT Assessment/Plan  PT - Assessment/Plan Comments on Treatment Session: pt stated "it gets easier".....spouse present during session PT Plan: Discharge plan remains appropriate PT Frequency: 7X/week Follow Up Recommendations: Home health PT Equipment Recommended: Rolling walker with 5" wheels PT Goals  Acute Rehab PT Goals PT Goal Formulation: With patient Pt will go Supine/Side to Sit: with supervision PT Goal: Supine/Side to Sit - Progress: Progressing toward goal Pt will go Sit to Supine/Side: with supervision PT Goal: Sit to Supine/Side - Progress: Progressing toward goal Pt will Ambulate: >150 feet;with supervision;with rolling walker PT Goal: Ambulate - Progress: Progressing toward goal Pt will Go Up / Down Stairs: 3-5 stairs;with min assist;with rail(s) PT Goal: Up/Down Stairs - Progress: Progressing toward goal Pt will Perform Home Exercise Program: with supervision, verbal cues required/provided PT Goal: Perform Home Exercise Program - Progress: Progressing toward goal  PT Treatment Precautions/Restrictions  Precautions Precautions: Knee Required Braces or Orthoses: Yes Knee Immobilizer: Discontinue once straight leg raise with < 10 degree lag Restrictions Weight Bearing Restrictions: No RLE Weight Bearing: Weight bearing as tolerated Mobility (including Balance) Bed Mobility Bed Mobility: Yes Sit to Supine - Left: 4: Min assist Sit to Supine - Left Details (indicate cue type and reason): min assist to support R LE up onto bed Transfers Transfers: Yes Sit to Stand: 4: Min assist;From chair/3-in-1 Sit to Stand Details (indicate cue type and reason): 25% VC's to slide R LE back as he stands to decrease posterior LOB Stand to Sit: 4: Min assist;To bed Stand to Sit Details: 25% VC's to extend R LE and reach back prior to  sit Ambulation/Gait Ambulation/Gait: Yes Ambulation/Gait Assistance: 4: Min assist (with KI on) Ambulation/Gait Assistance Details (indicate cue type and reason): 25% VC's to increase WB thru R LE and increase posture for increased safety Ambulation Distance (Feet): 80 Feet Assistive device: Rolling walker Gait Pattern: Step-to pattern Stairs: No Stairs Assistance: 4: Min assist Stairs Assistance Details (indicate cue type and reason): 2 steps and both reachable rails with spouse Stair Management Technique: Two rails Number of Stairs: 2  Height of Stairs: 6  Wheelchair Mobility Wheelchair Mobility: No    Exercise  Total Joint Exercises Ankle Circles/Pumps: Both;10 reps;AROM Quad Sets: AROM;Both;10 reps Gluteal Sets: AROM;Both;10 reps Towel Squeeze: AROM;Both;10 reps Short Arc QuadBarbaraann Boys;Right;10 reps Heel Slides: AAROM;Right;10 reps Hip ABduction/ADduction: AAROM;Right;10 reps Straight Leg Raises: AAROM;Right;10 reps End of Session PT - End of Session Equipment Utilized During Treatment: Gait belt;Right knee immobilizer Activity Tolerance: Patient tolerated treatment well Patient left: in bed;with call bell in reach;with family/visitor present Nurse Communication:  (pt requested a muscle relaxor after session and therapist ap) General Behavior During Session: [facility] for tasks performed Cognition: [facility] for tasks performed  Armando Reichert 05/02/2011, 3:27 PM

## 2011-05-02 NOTE — Progress Notes (Signed)
Attempted to see pt. X 3 today, but pt. Working with PT x 2, and using bathroom x 1.  Did briefly speak with pt. To screen for need for OT.  Pt. Would benefit from OT and will evaluate 05/03/11

## 2011-05-02 NOTE — Progress Notes (Signed)
Physical Therapy Treatment Patient Details Name: DRAYSON DORKO MRN: [id number] DOB: 05/10/1941 Today's Date: 05/02/2011 10:05 - 11:00 1 gt  1 te  1 ta  1 hm PT Assessment/Plan  PT - Assessment/Plan Comments on Treatment Session: spouse present during session and assisted PT Plan: Discharge plan remains appropriate PT Frequency: 7X/week Follow Up Recommendations: Home health PT Equipment Recommended: Rolling walker with 5" wheels PT Goals  Acute Rehab PT Goals PT Goal Formulation: With patient Pt will go Supine/Side to Sit: with supervision PT Goal: Supine/Side to Sit - Progress: Progressing toward goal Pt will go Sit to Supine/Side: with supervision PT Goal: Sit to Supine/Side - Progress: Progressing toward goal Pt will Ambulate: >150 feet;with supervision PT Goal: Ambulate - Progress: Progressing toward goal Pt will Go Up / Down Stairs: 3-5 stairs;with min assist PT Goal: Up/Down Stairs - Progress: Progressing toward goal Pt will Perform Home Exercise Program: with supervision, verbal cues required/provided PT Goal: Perform Home Exercise Program - Progress: Progressing toward goal  PT Treatment Precautions/Restrictions  Precautions Precautions: Knee Required Braces or Orthoses: Yes Knee Immobilizer: Discontinue once straight leg raise with < 10 degree lag (educated pt and spouse) Restrictions Weight Bearing Restrictions: No RLE Weight Bearing: Weight bearing as tolerated Mobility (including Balance) Bed Mobility Bed Mobility: No (pt OOB in recliner) Transfers Transfers: Yes Sit to Stand: 4: Min assist;With armrests;From chair/3-in-1 (25% VC's for hand placement and to initially stand on L LE) Stand to Sit: 4: Min assist Stand to Sit Details: 25% VC's to extend R LE prior to sit and hand placement for safety Ambulation/Gait Ambulation/Gait: Yes Ambulation/Gait Assistance: 4: Min assist Ambulation/Gait Assistance Details (indicate cue type and reason): with spouse with  VC's on safety Ambulation Distance (Feet): 65 Feet Assistive device: Rolling walker Gait Pattern: Step-to pattern (with KI on and 25% VC's sequencing and increasing posture) Stairs: Yes Stairs Assistance: 4: Min assist Stairs Assistance Details (indicate cue type and reason): 2 steps and both reachable rails with spouse Stair Management Technique: Two rails Number of Stairs: 2  Height of Stairs: 6  Wheelchair Mobility Wheelchair Mobility: No    Exercise  Total Joint Exercises Ankle Circles/Pumps: AROM;Both;10 reps Quad Sets: AROM;Both;10 reps Gluteal Sets: AROM;Both;10 reps Towel Squeeze: AROM;Both;10 reps Short Arc QuadBarbaraann Boys;Right;10 reps Heel Slides: AAROM;Right;10 reps (approx 45 degrees knee flex tolerance) Hip ABduction/ADduction: AAROM;Right;10 reps Straight Leg Raises: AAROM;Right;10 reps End of Session PT - End of Session Equipment Utilized During Treatment: Gait belt;Right knee immobilizer Activity Tolerance: Patient tolerated treatment well Patient left: in chair Nurse Communication:  (pt requested a muscle relaxor after session and therapist ap)  Armando Reichert 05/02/2011, 1:24 PM

## 2011-05-03 ENCOUNTER — Encounter (HOSPITAL_COMMUNITY): Payer: Self-pay | Admitting: Orthopedic Surgery

## 2011-05-03 MED ORDER — OXYCODONE HCL 5 MG PO TABS: 5.0000 mg | ORAL_TABLET | ORAL | Status: AC | PRN

## 2011-05-03 MED ORDER — ASPIRIN 325 MG PO TABS: 325.0000 mg | ORAL_TABLET | Freq: Every day | ORAL | Status: DC

## 2011-05-03 MED ORDER — CLOPIDOGREL BISULFATE 75 MG PO TABS: 75.0000 mg | ORAL_TABLET | Freq: Every day | ORAL | Status: DC

## 2011-05-03 MED ORDER — RIVAROXABAN 10 MG PO TABS: 10.0000 mg | ORAL_TABLET | ORAL | Status: DC

## 2011-05-03 MED ORDER — TAMSULOSIN HCL 0.4 MG PO CAPS: 0.4000 mg | ORAL_CAPSULE | ORAL | Status: DC

## 2011-05-03 MED ORDER — METHOCARBAMOL 500 MG PO TABS: 500.0000 mg | ORAL_TABLET | Freq: Four times a day (QID) | ORAL | Status: AC | PRN

## 2011-05-03 NOTE — Progress Notes (Signed)
Occupational Therapy Evaluation Patient Details Name: [name] [name] MRN: [id number] DOB: 08/21/1940 Today's Date: 05/03/2011 Time:11:08-11:30am Problem List:  Patient Active Problem List  Diagnoses  . HYPOTHYROIDISM  . Pure Hypercholesterolemia  . HYPERTENSION  . CORONARY ARTERY DISEASE  . ATRIAL FIBRILLATION  . PERIPHERAL VASCULAR DISEASE  . CHRONIC OBSTRUCTIVE PULMONARY DISEASE  . GERD  . DIVERTICULOSIS OF COLON  . KNEE, ARTHRITIS, DEGEN./OSTEO  . JOINT EFFUSION, KNEE  . KNEE PAIN  . OSTEOPOROSIS  . OTHER DYSPHAGIA  . NEPHROLITHIASIS, HX OF  . Hx-Major Cardiovasc Surg  . CHOLECYSTECTOMY, HX OF  . CORONARY ARTERY BYPASS GRAFT, HX OF  . Bruit  . AAA (abdominal aortic aneurysm)    Past Medical History:  Past Medical History  Diagnosis Date  . HYPOTHYROIDISM   . Pure hypercholesterolemia   . HYPERTENSION   . GERD   . DIVERTICULOSIS OF COLON   . JOINT EFFUSION, KNEE   . OSTEOPOROSIS   . Other dysphagia   . NEPHROLITHIASIS, HX OF   . Myocardial infarction     1994   . CORONARY ARTERY DISEASE   . Atrial fibrillation     no hx of reported at preop visit of 04/21/11   . PERIPHERAL VASCULAR DISEASE     AAA - 1994 with reimplant of renals   . Peripheral vascular disease     subclavian stenosis PTA - 3/08   . CHRONIC OBSTRUCTIVE PULMONARY DISEASE     pt denied at visit of 04/21/11   . Chronic kidney disease     AAA repair with reimplant of renals   . KNEE, ARTHRITIS, DEGEN./OSTEO     right knee    Past Surgical History:  Past Surgical History  Procedure Date  . Abdominal aortic aneurysm repair     with reimplantation of renals   . Gallbladder surgery 2004   . Knee surgery   . Vascular surgery     AAA  . Cardiac catheterization     2008  . Coronary artery bypass graft     1995  . Joint replacement     partial knee replacement on left 2002   . Other surgical history     left subclavian stenosis surgery PTA 08/2006   . Other surgical history     carotid  surgery on right 2004     OT Assessment/Plan/Recommendation OT Assessment Clinical Impression Statement: Pt seen today for this OT Eval w/ wife present. Pt overall supervision Funct mobility/transfers, and Min Assist LB bathe and dressing. Has DME at home, reports no further OT needs, will sign off at this time. OT Recommendation/Assessment: Patient does not need any further OT services OT Goals    OT Evaluation Precautions/Restrictions  Precautions Precautions: Knee Required Braces or Orthoses: Yes Knee Immobilizer: Discontinue once straight leg raise with < 10 degree lag Restrictions Weight Bearing Restrictions: Yes RLE Weight Bearing: Weight bearing as tolerated Prior Functioning Home Living Bathroom Shower/Tub: Engineer, manufacturing systems: Standard Bathroom Accessibility: Yes How Accessible: Accessible via walker Home Adaptive Equipment: Bedside commode/3-in-1;Walker - rolling Prior Function Level of Independence: Independent with basic ADLs;Independent with transfers Driving: Yes ADL ADL Eating/Feeding: Performed;Independent Where Assessed - Eating/Feeding: Chair Grooming: Performed;Wash/dry hands Where Assessed - Grooming: Standing at sink Upper Body Bathing: Simulated;Modified independent Where Assessed - Upper Body Bathing: Sitting, chair Lower Body Bathing: Simulated;Minimal assistance Lower Body Bathing Details (indicate cue type and reason): PRN assist Where Assessed - Lower Body Bathing: Sitting, chair Upper Body Dressing: Performed;Modified independent Where Assessed -  Upper Body Dressing: Sitting, chair Lower Body Dressing: Performed;Minimal assistance Lower Body Dressing Details (indicate cue type and reason): PRN assist for don/doff socks, shoes Where Assessed - Lower Body Dressing: Sitting, chair Toilet Transfer: Performed;Supervision/safety Toilet Transfer Details (indicate cue type and reason): VC's for safety and sequencing with RW, hand placement  on 3:1 over commode Toilet Transfer Method: Ambulating Toilet Transfer Equipment: Raised toilet seat with arms (or 3-in-1 over toilet) Toileting - Clothing Manipulation: Performed;Supervision/safety Toileting - Clothing Manipulation Details (indicate cue type and reason): VC's for safety and sequencing with RW, hand placement on 3:1 over commode Where Assessed - Toileting Clothing Manipulation: Sit to stand from 3-in-1 or toilet Toileting - Hygiene: Performed;Supervision/safety Toileting - Hygiene Details (indicate cue type and reason): VC's for safety and sequencing with RW, hand placement on 3:1 over commode Where Assessed - Toileting Hygiene: Sit to stand from 3-in-1 or toilet Tub/Shower Transfer: Other (comment) (Pt reports sponge bathing initially, may get shower chair) ADL Comments: Pt plans to be d/c home with PRN assist from wife for LB ADL's and Selfcare. Wife present throughout eval, recommend 3:1 (pt has). Pt, family w/o further reported OT needs, will sign off at this time Vision/Perception    Cognition Cognition Arousal/Alertness: Awake/alert Overall Cognitive Status: Appears within functional limits for tasks assessed Orientation Level: Oriented X4 Sensation/Coordination Sensation Light Touch: Appears Intact Extremity Assessment RUE Assessment RUE Assessment: Within Functional Limits LUE Assessment LUE Assessment: Within Functional Limits Mobility  Bed Mobility Bed Mobility: No (Pt sitting in recliner) Transfers Transfers: Yes Sit to Stand: 5: Supervision;From chair/3-in-1;With armrests Sit to Stand Details (indicate cue type and reason): VCs safety, technique, hand placement Stand to Sit: 5: Supervision;To chair/3-in-1 Stand to Sit Details: VCs safety, technique, hand placement Exercises Total Joint Exercises Ankle Circles/Pumps: AROM;Both;10 reps;Seated Quad Sets: AROM;Right;10 reps;Seated;Strengthening Short Arc Quad: Right;AAROM;10  reps;Seated;Strengthening Heel Slides: AAROM;Right;10 reps;Seated;Strengthening Hip ABduction/ADduction: AAROM;Right;Strengthening;10 reps;Seated Straight Leg Raises: AAROM;Strengthening;Right;10 reps;Seated End of Session OT - End of Session Equipment Utilized During Treatment: Gait belt;Right knee immobilizer;Other (comment) (RW) Activity Tolerance: Patient tolerated treatment well Patient left: in chair;with call bell in reach;with family/visitor present General Behavior During Session: [facility] for tasks performed Cognition: [facility] for tasks performed   Alm Bustard 05/03/2011, 1:55 PM

## 2011-05-03 NOTE — Progress Notes (Signed)
Physical Therapy Treatment Patient Details Name: [name] [name] MRN: [id number] DOB: [birth year] Today's Date: 05/03/2011 Time: 7829-5621   1G, 1TE PT Assessment/Plan  PT - Assessment/Plan Comments on Treatment Session: Pt continuing to progress well. Plans to D/C home today with wife. Wife stated she will have son-n-law assist with getting pt into home for safety. PT Plan: Discharge plan remains appropriate Follow Up Recommendations: Home health PT PT Goals  Acute Rehab PT Goals PT Goal: Ambulate - Progress: Progressing toward goal PT Goal: Up/Down Stairs - Progress: Progressing toward goal PT Goal: Perform Home Exercise Program - Progress: Progressing toward goal  PT Treatment Precautions/Restrictions  Precautions Precautions: Knee Required Braces or Orthoses: Yes Knee Immobilizer: Discontinue once straight leg raise with < 10 degree lag Restrictions Weight Bearing Restrictions: Yes RLE Weight Bearing: Weight bearing as tolerated Mobility (including Balance) Bed Mobility Bed Mobility: No (Pt sitting in recliner) Transfers Transfers: Yes Sit to Stand: Other (comment);From chair/3-in-1;With upper extremity assist (Min-guard assist) Sit to Stand Details (indicate cue type and reason): VCs safety, technique, hand placement Stand to Sit: 4: Min assist;To chair/3-in-1;With upper extremity assist Stand to Sit Details: VCs safety, technique, hand placement. Assist to slide R LE forward. Ambulation/Gait Ambulation/Gait: Yes Ambulation/Gait Assistance: Other (comment) (Min-guard assist) Ambulation/Gait Assistance Details (indicate cue type and reason): VCs safety, technique, sequence, encouragement for pt to increase WBing on R LE. 1 brief standing rest break to rest UEs Ambulation Distance (Feet): 90 Feet Assistive device: Rolling walker Gait Pattern: Step-to pattern;Decreased stride length;Decreased step length - right (Wide BOS) Stairs: Yes Stairs Assistance: 4: Min  assist Stairs Assistance Details (indicate cue type and reason): VCs safety, technique, sequence. Wife present to observe/assist with bracing RW on steps. Issued handout. Pt/wife state they will use walker to negotiate front steps (2). Stair Management Technique: With walker;Backwards Number of Stairs: 2     Exercise  Total Joint Exercises Ankle Circles/Pumps: AROM;Both;10 reps;Seated Quad Sets: AROM;Right;10 reps;Seated;Strengthening Short Arc Quad: Right;AAROM;10 reps;Seated;Strengthening Heel Slides: AAROM;Right;10 reps;Seated;Strengthening Hip ABduction/ADduction: AAROM;Right;Strengthening;10 reps;Seated Straight Leg Raises: AAROM;Strengthening;Right;10 reps;Seated End of Session PT - End of Session Equipment Utilized During Treatment: Gait belt Activity Tolerance: Patient tolerated treatment well Patient left: in chair;with call bell in reach;with family/visitor present General Behavior During Session: [facility] for tasks performed Cognition: [facility] for tasks performed  Rebeca Alert [facility] 05/03/2011, 11:39 AM

## 2011-05-03 NOTE — Discharge Summary (Signed)
Physician Discharge Summary   Patient ID: MARQUS MACPHEE MRN: [id number] DOB/AGE: May 25, 1941 70 y.o.  Admit date: 04/29/2011 Discharge date: 05/03/2011  Primary Diagnosis: Osteoarthritis Right Knee   Admission Diagnoses: Past Medical History  Diagnosis Date  . HYPOTHYROIDISM   . Pure hypercholesterolemia   . HYPERTENSION   . GERD   . DIVERTICULOSIS OF COLON   . JOINT EFFUSION, KNEE   . OSTEOPOROSIS   . Other dysphagia   . NEPHROLITHIASIS, HX OF   . Myocardial infarction     1994   . CORONARY ARTERY DISEASE   . Atrial fibrillation     no hx of reported at preop visit of 04/21/11   . PERIPHERAL VASCULAR DISEASE     AAA - 1994 with reimplant of renals   . Peripheral vascular disease     subclavian stenosis PTA - 3/08   . CHRONIC OBSTRUCTIVE PULMONARY DISEASE     pt denied at visit of 04/21/11   . Chronic kidney disease     AAA repair with reimplant of renals   . KNEE, ARTHRITIS, DEGEN./OSTEO     right knee     Discharge Diagnoses:  Postop Hyponatremia Postop Urinary Retnention  Procedure: Procedure(s) (LRB): TOTAL KNEE ARTHROPLASTY (Right)   Consults: none  HPI:  The patient is a 69 year old male who comes in today for a preoperative History and Physical. The patient is scheduled for a right total knee arthroplasty to be performed by Dr. [name]. [name], MD at [facility] on 04/29/2011 . Radiographs, AP both knees and lateral, show the unicompartmental replacement on the left to be in good position with no abnormalities. On the right he has advanced endstage arthritis of the knee. Patient to undergo a Right Total Knee Replacement. Surgery to be performed by Dr. [name]. Risks and benefits of the surgery have been discussed with the patient and they elect to proceed with surgery. There are on active contraindications to upcoming procedure such as ongoing infection or progressive neurological disease. Plans for home after hospital stay.       Laboratory  Data: [facility]  05/01/11 0531  04/30/11 0525   WBC  10.4  13.2*   HGB  10.7*  11.6*   HCT  30.7*  32.8*   PLT  144*  151    BMET  Basename  05/01/11 0531  04/30/11 0525    NA  131*  135    K  4.3  4.6    CL  97  103    CO2  27  26    GLUCOSE  140*  128*    BUN  13  18    CREATININE  0.75  0.86    CALCIUM  8.9  8.8      INR    Date  Value  Range  Status   04/21/2011  1.01  0.00-1.49 (no units)  Final    Basename  05/02/11 0535     HGB  10.5*       Basename  05/02/11 0535    WBC  9.2    RBC  3.38*    HCT  30.0*    PLT  138*      Basename  05/01/11 0531    NA  131*    K  4.3    CL  97    CO2  27    BUN  13    CREATININE  0.75    GLUCOSE  140*  CALCIUM      X-Rays:Dg Chest 2 View  04/21/2011  *RADIOLOGY REPORT*  Clinical Data: Preop for right knee surgery  CHEST - 2 VIEW  Comparison: Portable chest x-ray of 01/30 5009  Findings: The lungs are clear.  The heart is mildly enlarged and stable.  Median sternotomy sutures are noted from prior CABG. There are degenerative changes throughout the thoracic spine.  IMPRESSION: Stable mild cardiomegaly.  No active lung disease.  Original Report Authenticated By: Juline Patch, M.D.    EKG: Orders placed during the hospital encounter of 04/29/11  . EKG     Hospital Course:  Patient was admitted to [facility] and taken to the OR and underwent the above state procedure without complications.  Patient tolerated the procedure well and was later transferred to the recovery room and then to the med/surg floor for postoperative care.  They were given PO and IV analgesics for pain control following their surgery.  They were given 24 hours of postoperative antibiotics and started on DVT prophylaxis.   PT and OT were ordered for total joint protocol.  Discharge planning consulted to help with postop disposition and equipment needs.  Patient had a decent night on the evening of surgery and started to get up with therapy on day  one.  PCA was discontinued and they were weaned over to PO meds.  Hemovac drain was pulled without difficulty.  Continued to progress with therapy into day two.  Dressing was changed on day two and the incision was healing well.  By day three, the patient had progressed with therapy and meeting goals.  Incision was healing well.  Unfortunately, he had difficulty voiding his urine and required reinsertion of his foley.  Tried a voiding trial later and also given Flomax.  He slowly improved from that standpoint into the next day.  Seen on rounds on day four by Dr. [name] and doing much better.  He was progressing with PT and was discharged home later that day.   Discharge Medications:  Oxy IR, Robaxin, Xarelto   Diet:low sodium  Activity:WBAT  Follow-up:in 2 weeks  Disposition: Home  Discharged Condition: good   Discharge Orders    Future Orders Please Complete By Expires   Diet - low sodium heart healthy      Call MD / Call 911      Comments:   If you experience chest pain or shortness of breath, CALL 911 and be transported to the hospital emergency room.  If you develope a fever above 101 F, pus (white drainage) or increased drainage or redness at the wound, or calf pain, call your surgeon's office.   Constipation Prevention      Comments:   Drink plenty of fluids.  Prune juice may be helpful.  You may use a stool softener, such as Colace (over the counter) 100 mg twice a day.  Use MiraLax (over the counter) for constipation as needed.   Increase activity slowly as tolerated      Weight Bearing as taught in Physical Therapy      Comments:   Use a walker or crutches as instructed.   Discharge instructions      Comments:   Pick up stool softner and laxative for home. Do not submerge incision under water. May shower.    Driving restrictions      Comments:   No driving   Lifting restrictions      Comments:   No lifting   TED  hose      Comments:   Use stockings (TED hose) for 2  weeks on both leg(s).  You may remove them at night for sleeping.   Change dressing      Comments:   Change dressing daily with sterile 4 x 4 inch gauze dressing and apply TED hose.     Current Discharge Medication List    START taking these medications   Details  methocarbamol (ROBAXIN) 500 MG tablet Take 1 tablet (500 mg total) by mouth every 6 (six) hours as needed. Qty: 80 tablet, Refills: 0    oxyCODONE (OXY IR/ROXICODONE) 5 MG immediate release tablet Take 1-2 tablets (5-10 mg total) by mouth every 4 (four) hours as needed for pain. Qty: 90 tablet, Refills: 0    rivaroxaban (XARELTO) 10 MG TABS tablet Take 1 tablet (10 mg total) by mouth daily. Qty: 118 tablet, Refills: 0    Tamsulosin HCl (FLOMAX) 0.4 MG CAPS Take 1 capsule (0.4 mg total) by mouth daily after supper. Qty: 14 capsule, Refills: 0      CONTINUE these medications which have NOT CHANGED   Details  lansoprazole (PREVACID) 15 MG capsule Take 15 mg by mouth daily.     levothyroxine (SYNTHROID, LEVOTHROID) 112 MCG tablet Take 112 mcg by mouth every morning.     metoprolol (LOPRESSOR) 25 MG tablet Take 1 tablet (25 mg total) by mouth 2 (two) times daily. Qty: 90 tablet, Refills: 12    niacin 500 MG tablet Take 500 mg by mouth. 4 TABS AT BEDTIME     rosuvastatin (CRESTOR) 20 MG tablet Take 20 mg by mouth daily.     nitroGLYCERIN (NITROSTAT) 0.4 MG SL tablet Place 1 tablet (0.4 mg total) under the tongue every 5 (five) minutes as needed for chest pain. Qty: 25 tablet, Refills: 10    triamterene-hydrochlorothiazide (DYAZIDE) 37.5-25 MG per capsule Take 1 each (1 capsule total) by mouth every morning. Qty: 30 capsule, Refills: 8      STOP taking these medications     Coenzyme Q10 (COQ-10) 100 MG CAPS      [employer] OMEGA-3 KRILL OIL PO      Flaxseed, Linseed, (RA FLAX SEED OIL 1000) 1000 MG CAPS      aspirin 325 MG tablet      clopidogrel (PLAVIX) 75 MG tablet        Follow-up Information    Make an  appointment with Loanne Drilling (Tuesday, November 27,2012)    Contact information:   [facility] [address] [place] [address] [phone]          Signed: Patrica Duel 05/03/2011, 10:09 AM

## 2011-05-03 NOTE — Progress Notes (Signed)
CM consult done. See CM notes in shadow chart.  Narely Nobles Wyche RN BSN CCM [phone] 05/03/2011    

## 2011-05-23 ENCOUNTER — Ambulatory Visit (HOSPITAL_COMMUNITY)
Admission: RE | Admit: 2011-05-23 | Discharge: 2011-05-23 | Disposition: A | Discharge status: Home / Self Care | Payer: Medicare Other | Source: Ambulatory Visit | Attending: Orthopedic Surgery | Admitting: Orthopedic Surgery

## 2011-05-23 DIAGNOSIS — IMO0001 Reserved for inherently not codable concepts without codable children: Secondary | ICD-10-CM | POA: Insufficient documentation

## 2011-05-23 DIAGNOSIS — R262 Difficulty in walking, not elsewhere classified: Secondary | ICD-10-CM | POA: Insufficient documentation

## 2011-05-23 DIAGNOSIS — M25669 Stiffness of unspecified knee, not elsewhere classified: Secondary | ICD-10-CM | POA: Insufficient documentation

## 2011-05-23 DIAGNOSIS — I10 Essential (primary) hypertension: Secondary | ICD-10-CM | POA: Insufficient documentation

## 2011-05-23 DIAGNOSIS — M25569 Pain in unspecified knee: Secondary | ICD-10-CM | POA: Insufficient documentation

## 2011-05-23 DIAGNOSIS — M6281 Muscle weakness (generalized): Secondary | ICD-10-CM | POA: Insufficient documentation

## 2011-05-23 NOTE — Progress Notes (Signed)
Physical Therapy Evaluation  Patient Details  Name: [name] [name] MRN: [id number] Date of Birth: 03/07/1941  Today's Date: 05/23/2011 Time: 5284-1324 Time Calculation (min): 47 min Charges: 1 eval Visit#: 1  of 12   Re-eval: 06/22/11 Assessment Diagnosis: R TKR Surgical Date: 04/29/11 Next MD Visit: 06/07/11 Prior Therapy: None for this condition  Past Medical History:  Past Medical History  Diagnosis Date  . HYPOTHYROIDISM   . Pure hypercholesterolemia   . HYPERTENSION   . GERD   . DIVERTICULOSIS OF COLON   . JOINT EFFUSION, KNEE   . OSTEOPOROSIS   . Other dysphagia   . NEPHROLITHIASIS, HX OF   . Myocardial infarction     1994   . CORONARY ARTERY DISEASE   . Atrial fibrillation     no hx of reported at preop visit of 04/21/11   . PERIPHERAL VASCULAR DISEASE     AAA - 1994 with reimplant of renals   . Peripheral vascular disease     subclavian stenosis PTA - 3/08   . CHRONIC OBSTRUCTIVE PULMONARY DISEASE     pt denied at visit of 04/21/11   . Chronic kidney disease     AAA repair with reimplant of renals   . KNEE, ARTHRITIS, DEGEN./OSTEO     right knee    Past Surgical History:  Past Surgical History  Procedure Date  . Abdominal aortic aneurysm repair     with reimplantation of renals   . Gallbladder surgery 2004   . Knee surgery   . Vascular surgery     AAA  . Cardiac catheterization     2008  . Coronary artery bypass graft     1995  . Joint replacement     partial knee replacement on left 2002   . Other surgical history     left subclavian stenosis surgery PTA 08/2006   . Other surgical history     carotid surgery on right 2004   . Total knee arthroplasty 04/29/2011    Procedure: TOTAL KNEE ARTHROPLASTY;  Surgeon: Loanne Drilling;  Location: [facility];  Service: Orthopedics;  Laterality: Right;    Subjective Symptoms/Limitations Symptoms: Pt is a 70 year old male who is referred to PT s/p R TKR on 04/29/11.  He received PT in the hospital and with HHPT  for 3 weeks.  Pt c/co is ambulating with a cane in the community, unable to drive, difficulty with outdoor housework.  Walk: 30 minutes with a shopping cart; Stand: 20 minutes; Sitting: 5 minutes secondary to increased difficulty with comfort.   No difficulty getting into and out of car.  Stairs: step to pattern. Pain 2/10. Range: 2-10/10.  Nature: Aching. Sharp shooting to the top of his knee.  Alleviating: Pain medication; Aggravating: increased activity.  How long can you sit comfortably?: 5 minutes How long can you stand comfortably?: 20 minutes  How long can you walk comfortably?: 30 minutes with shopping cart Pain Assessment Currently in Pain?: Yes Pain Score:   2 Pain Location: Knee Pain Orientation: Right Pain Type: Acute pain   05/23/11 1500 Assessment Diagnosis R TKR Surgical Date 04/29/11 Next MD Visit 06/07/11 Prior Therapy None for this condition Restrictions Weight Bearing Restrictions Yes RLE Weight Bearing WBAT Prior Function Level of Independence Independent with gait;Independent with homemaking with wheelchair;Independent with homemaking with ambulation Driving Yes Vocation Retired Leisure [employer] (Comment) Comments enjoys working the yard.  He enjoys mowing the grass.  He attends church Functional Tests Functional Tests 5 STS  w/o UE support 14.9 sec favoring R LE.  Functional Tests LEFS: 43/80 Functional Tests Palpation/Observation: mild swelling.  denies nausea/vomiting or signs of infection. - Homan's test.  Increased pain and tenderness to anterior R knee and joint line  RLE AROM (degrees) Right Knee Extension 0-130 -12  Right Knee Flexion 0-140 70  RLE PROM (degrees) Right Knee Extension 0-130 -9  Right Knee Flexion 0-140 90  RLE Strength Right Hip Flexion 4/5 Right Hip Extension 3/5 Right Hip ABduction 3+/5 Right Hip ADduction 4/5 Right Knee Flexion 3+/5 Right Knee Extension 3+/5 Ambulation/Gait Ambulation/Gait Yes Assistive device Straight  cane Gait Pattern Decreased stance time - right;Decreased hip/knee flexion - right;Decreased weight shift to right;Antalgic Static Standing Balance Static Standing - Comment/# of Minutes Each position held max 10 Single Leg Stance - Right Leg 3  (impaired ankle strategy) Single Leg Stance - Left Leg 10  Tandem Stance - Right Leg 10  Tandem Stance - Left Leg 10  Rhomberg - Eyes Opened 10  Rhomberg - Eyes Closed 10      Exercise/Treatments Standing Heel Raises: 10 reps Functional Squat: 10 reps Supine Quad Sets: Right;10 reps;Limitations Quad Sets Limitations: 10 sec hold Short Arc Quad Sets: Right;5 reps;Limitations Water quality scientist Limitations: 10 sec hold Heel Slides: Right;10 reps Prone  Hamstring Curl: 10 reps Hip Extension: Right;10 reps Prone Knee Hang: 1 minute    Physical Therapy Assessment and Plan PT Assessment and Plan Clinical Impression Statement: Pt is a 70 year old male referred to PT s/p R TKR on 04/29/11.  After examiniation it was found that he has current body structure impairments including dificulty walking, increased R knee pain, decreased R knee ROM, impaired balance, decreased activity tolerance, and impaired percieved functional ability which are limiting his ability to participate in household and community activities.  Pt will benefit from skilled OPPT in order to address above impairments in order maximize funciton.  Rehab Potential: Good PT Frequency: Min 3X/week PT Duration: 4 weeks PT Treatment/Interventions: Gait training;Stair training;Functional mobility training;Therapeutic activities;Therapeutic exercise;Balance training;Neuromuscular re-education;Patient/family education PT Plan: Bike, TKE, heel/toe raises, steps.  manual for on knee extension    Goals Home Exercise Program Pt will Perform Home Exercise Program: Independently PT Short Term Goals Time to Complete Short Term Goals: 2 weeks PT Short Term Goal 1: Pt will report pain less  than 4/10 for 50% of his day.   PT Short Term Goal 2: Pt will demonstrate AROM:  PT Short Term Goal 3: Pt will improve R SLS on static surface x10 sec PT Short Term Goal 4: Pt will improve R LE muscle grade to ambulate independently in the community PT Long Term Goals Time to Complete Long Term Goals: 4 weeks PT Long Term Goal 1: Pt will report pain less than 3/10 for 75% of his day for improved quality of life PT Long Term Goal 2: Pt will improve 5 STS w/o UE support in 10 sec.  Long Term Goal 3: Pt will improve RLE ROM to [facility] in order to ambulate with appropriate gait mechanics independently. Long Term Goal 4: Pt will improve LE strength to [facility] in order to ascend and descend 10 stairs with 1 handrail with reciprocal pattern.  PT Long Term Goal 5: Pt will improve LEFS by 9 points for improved percieved functional ability.   Problem List Patient Active Problem List  Diagnoses  . HYPOTHYROIDISM  . Pure Hypercholesterolemia  . HYPERTENSION  . CORONARY ARTERY DISEASE  . ATRIAL FIBRILLATION  .  PERIPHERAL VASCULAR DISEASE  . CHRONIC OBSTRUCTIVE PULMONARY DISEASE  . GERD  . DIVERTICULOSIS OF COLON  . KNEE, ARTHRITIS, DEGEN./OSTEO  . JOINT EFFUSION, KNEE  . KNEE PAIN  . OSTEOPOROSIS  . OTHER DYSPHAGIA  . NEPHROLITHIASIS, HX OF  . Hx-Major Cardiovasc Surg  . CHOLECYSTECTOMY, HX OF  . CORONARY ARTERY BYPASS GRAFT, HX OF  . Bruit  . AAA (abdominal aortic aneurysm)  . Knee pain  . Stiffness of joint, not elsewhere classified, lower leg  . Muscle weakness (generalized)  . Difficulty in walking    PT - End of Session Activity Tolerance: Patient tolerated treatment well   Daymen Hassebrock 05/23/2011, 4:14 PM  Physician Documentation Your signature is required to indicate approval of the treatment plan as stated above.  Please sign and either send electronically or make a copy of this report for your files and return this physician signed original.   Please mark one 1.__approve of plan   2. ___approve of plan with the following conditions.   ______________________________                                                          _____________________ Physician Signature                                                                                                             Date

## 2011-05-24 ENCOUNTER — Ambulatory Visit (HOSPITAL_COMMUNITY): Payer: Medicare Other | Admitting: Physical Therapy

## 2011-05-25 ENCOUNTER — Ambulatory Visit (HOSPITAL_COMMUNITY)
Admission: RE | Admit: 2011-05-25 | Discharge: 2011-05-25 | Disposition: A | Discharge status: Home / Self Care | Payer: Medicare Other | Source: Ambulatory Visit | Attending: Family Medicine | Admitting: Family Medicine

## 2011-05-25 NOTE — Progress Notes (Signed)
Physical Therapy Treatment Patient Details  Name: [name] [name] MRN: [id number] Date of Birth: 10-15-1940  Today's Date: 05/25/2011 Time: 9147-8295 Time Calculation (min): 56 min Visit#: 2  of 12   Re-eval: 06/22/11 Charges: Therex x 38'  Subjective: Symptoms/Limitations Symptoms: Pt states his pain isn't bad this morning since he took a pain pill. He also reports HEP compliance. Pain Assessment Currently in Pain?: Yes Pain Score:   1 (0-1/10) Pain Location: Knee Pain Orientation: Right   Exercise/Treatments Aerobic Stationary Bike: 6' rocking seat 11 Standing Heel Raises: 10 reps Knee Flexion: 10 reps Functional Squat: 10 reps Rocker Board: 1 minute SLS:   Other Standing Knee Exercises: toe raises x 10 Seated Long Arc Quad: 10 reps Supine Quad Sets: Right;10 reps;Limitations Quad Sets Limitations: 10" hold Short Arc Quad Sets: 10 reps Short Arc Quad Sets Limitations: 10" hold Heel Slides: Right;10 reps Knee Extension: PROM Knee Flexion: PROM Prone  Hamstring Curl:  (Time) Hip Extension:  (Time) Prone Knee Hang:  (Time)  Physical Therapy Assessment and Plan PT Assessment and Plan Clinical Impression Statement: Pt w/o complaint of increased pain throughout session. Pt unable to make full revolution on rec bike secondary decreased ROM. Pt requires VC's throughout session to avoid compensation for decreased ROM with therex. Ice applied to R knee over pants to decrease swelling. PT Plan: Continue to progressstrength and ROM per PT POC. Begin lateral/forward step-up next session.     Problem List Patient Active Problem List  Diagnoses  . HYPOTHYROIDISM  . Pure Hypercholesterolemia  . HYPERTENSION  . CORONARY ARTERY DISEASE  . ATRIAL FIBRILLATION  . PERIPHERAL VASCULAR DISEASE  . CHRONIC OBSTRUCTIVE PULMONARY DISEASE  . GERD  . DIVERTICULOSIS OF COLON  . KNEE, ARTHRITIS, DEGEN./OSTEO  . JOINT EFFUSION, KNEE  . KNEE PAIN  . OSTEOPOROSIS  . OTHER DYSPHAGIA   . NEPHROLITHIASIS, HX OF  . Hx-Major Cardiovasc Surg  . CHOLECYSTECTOMY, HX OF  . CORONARY ARTERY BYPASS GRAFT, HX OF  . Bruit  . AAA (abdominal aortic aneurysm)  . Knee pain  . Stiffness of joint, not elsewhere classified, lower leg  . Muscle weakness (generalized)  . Difficulty in walking    PT - End of Session Activity Tolerance: Patient tolerated treatment well General Behavior During Session: [facility] for tasks performed Cognition: [facility] for tasks performed  Antonieta Iba 05/25/2011, 9:35 AM

## 2011-05-26 ENCOUNTER — Ambulatory Visit (HOSPITAL_COMMUNITY)
Admission: RE | Admit: 2011-05-26 | Discharge: 2011-05-26 | Disposition: A | Discharge status: Home / Self Care | Payer: Medicare Other | Source: Ambulatory Visit | Attending: Family Medicine | Admitting: Family Medicine

## 2011-05-26 NOTE — Progress Notes (Addendum)
Physical Therapy Treatment Patient Details  Name: [name] [name] MRN: [id number] Date of Birth: February 27, 1941  Today's Date: 05/26/2011 Time: 1305-1409 Time Calculation (min): 64 min Visit#: 3  of 12   Re-eval: 06/22/11 Charges: Therex x 44' Ice x 10'  Subjective: Symptoms/Limitations Symptoms: Pt is pain free. Pain Assessment Currently in Pain?: No/denies Pain Score: 0-No pain   Exercise/Treatments Aerobic Stationary Bike: 6' rocking seat 11 Standing Heel Raises: 15 reps Knee Flexion: 15 reps Functional Squat: 15 reps Rocker Board: 2 minutes Other Standing Knee Exercises: toe raises x 15 Seated Long Arc Quad: 10 reps;Weights Long Arc Quad Weight: 3 lbs. Supine Quad Sets: 15 reps Quad Sets Limitations: 10" hold Short Arc Quad Sets: 15 reps Short Arc Quad Sets Limitations: 10 " hold Knee Extension: PROM Knee Flexion: PROM Prone  Hamstring Curl: 10 reps Hip Extension: 10 reps   Modalities Modalities: Cryotherapy Cryotherapy Number Minutes Cryotherapy: 10 Minutes Cryotherapy Location: Knee (Right) Type of Cryotherapy: Ice pack  Physical Therapy Assessment and Plan PT Assessment and Plan Clinical Impression Statement: Began forward and lateral step-ups with minimal difficulty and minimal need for cueing. Pt displays gains in ROM. Pt able to make 1 revolution backward on rec bike with hip raise. PT Plan: Continue to progress stregnth and ROM per PT POC.    Problem List Patient Active Problem List  Diagnoses  . HYPOTHYROIDISM  . Pure Hypercholesterolemia  . HYPERTENSION  . CORONARY ARTERY DISEASE  . ATRIAL FIBRILLATION  . PERIPHERAL VASCULAR DISEASE  . CHRONIC OBSTRUCTIVE PULMONARY DISEASE  . GERD  . DIVERTICULOSIS OF COLON  . KNEE, ARTHRITIS, DEGEN./OSTEO  . JOINT EFFUSION, KNEE  . KNEE PAIN  . OSTEOPOROSIS  . OTHER DYSPHAGIA  . NEPHROLITHIASIS, HX OF  . Hx-Major Cardiovasc Surg  . CHOLECYSTECTOMY, HX OF  . CORONARY ARTERY BYPASS GRAFT, HX OF  .  Bruit  . AAA (abdominal aortic aneurysm)  . Knee pain  . Stiffness of joint, not elsewhere classified, lower leg  . Muscle weakness (generalized)  . Difficulty in walking    PT - End of Session Activity Tolerance: Patient tolerated treatment well General Behavior During Session: [facility] for tasks performed Cognition: [facility] for tasks performed  Antonieta Iba 05/26/2011, 2:06 PM

## 2011-05-30 ENCOUNTER — Ambulatory Visit (HOSPITAL_COMMUNITY)
Admission: RE | Admit: 2011-05-30 | Discharge: 2011-05-30 | Disposition: A | Discharge status: Home / Self Care | Payer: Medicare Other | Source: Ambulatory Visit | Attending: Family Medicine | Admitting: Family Medicine

## 2011-05-30 NOTE — Progress Notes (Signed)
Physical Therapy Treatment Patient Details  Name: [name] [name] MRN: [id number] Date of Birth: [birth year]  Today's Date: 05/30/2011 Time: 4098-1191 Time Calculation (min): 48 min Charges: 28' TE, 10' manual, 1 ice Visit#: 4  of 12   Re-eval: 06/22/11    Subjective: Symptoms/Limitations Symptoms: Pt reports that he was dizzy for the first time in a long time.  He request for BP to be taken.  BP: 180/80.  Reports that he is taking a small amount of his BP medication. Pain Assessment Currently in Pain?: Yes Pain Score:   1  Precautions/Restrictions     Mobility (including Balance)       Exercise/Treatments Aerobic Stationary Bike: 6' Seat 11 w/full ROM Standing Heel Raises: 20 reps Knee Flexion: 20 reps Terminal Knee Extension: Right;10 reps;Limitations Functional Squat: 20 reps Prone  Prone Knee Hang: 5 minutes;Limitations Prone Knee Hang Limitations: with manual to hamstrings and joint mobs to increase extension    Modalities Modalities: Cryotherapy Manual Therapy Manual Therapy: Other (comment) Other Manual Therapy: PROM w/ grade I-III joint mobs in prone to improve knee flexion and extension. x10 minutes Cryotherapy Number Minutes Cryotherapy: 10 Minutes  Physical Therapy Assessment and Plan PT Assessment and Plan Clinical Impression Statement: Improved knee flexion demo by full AROM on bike (after a few ther-ex completed).  Had mild improvement in knee extension with prone hang and manual therapy to hamstrings.  Overall cont to have gait abnormalities secondary to decreased knee extension. PT Plan: Cont to progress strength and ROM, focus on extension, then flexion     Goals    Problem List Patient Active Problem List  Diagnoses  . HYPOTHYROIDISM  . Pure Hypercholesterolemia  . HYPERTENSION  . CORONARY ARTERY DISEASE  . ATRIAL FIBRILLATION  . PERIPHERAL VASCULAR DISEASE  . CHRONIC OBSTRUCTIVE PULMONARY DISEASE  . GERD  . DIVERTICULOSIS OF COLON    . KNEE, ARTHRITIS, DEGEN./OSTEO  . JOINT EFFUSION, KNEE  . KNEE PAIN  . OSTEOPOROSIS  . OTHER DYSPHAGIA  . NEPHROLITHIASIS, HX OF  . Hx-Major Cardiovasc Surg  . CHOLECYSTECTOMY, HX OF  . CORONARY ARTERY BYPASS GRAFT, HX OF  . Bruit  . AAA (abdominal aortic aneurysm)  . Knee pain  . Stiffness of joint, not elsewhere classified, lower leg  . Muscle weakness (generalized)  . Difficulty in walking       [name] [name] 05/30/2011, 12:18 PM

## 2011-06-01 ENCOUNTER — Ambulatory Visit (HOSPITAL_COMMUNITY)
Admission: RE | Admit: 2011-06-01 | Discharge: 2011-06-01 | Disposition: A | Discharge status: Home / Self Care | Payer: Medicare Other | Source: Ambulatory Visit | Attending: Family Medicine | Admitting: Family Medicine

## 2011-06-01 NOTE — Progress Notes (Signed)
Physical Therapy Treatment Patient Details  Name: [name] [name] MRN: [id number] Date of Birth: 03-01-1941  Today's Date: 06/01/2011 Time: 4098-1191 Time Calculation (min): 74 min Visit#: 5  of 12   Re-eval: 06/21/10  Charge: therex 50 min Manual 10 min Gait 8 min  Subjective: Symptoms/Limitations Symptoms: Pt reports he is pain free today, pain meds at 9:30 this morning.  Pt able to perform all HEP with no difficulty and no cueing required.  Pt stated no dizziness, reduced meds to half a pill twice a day. Pain Assessment Currently in Pain?: No/denies  Objective:   Exercise/Treatments Stretches Active Hamstring Stretch: 3 reps;30 seconds;Limitations Active Hamstring Stretch Limitations: with rope Quad Stretch: 3 reps;30 seconds;Limitations Quad Stretch Limitations: with rope Aerobic Stationary Bike: 6' Seat 11 w/full ROM Standing Heel Raises: 20 reps;Limitations Heel Raises Limitations: Toe raises 20 reps Knee Flexion: 20 reps Terminal Knee Extension: Right;10 reps;Theraband Theraband Level (Terminal Knee Extension): Level 4 (Blue) Functional Squat: 20 reps Seated Long Arc Quad: 15 reps;Weights;Limitations Long Arc Quad Weight: 3 lbs. Long [employer] Limitations: 5" holds Supine Quad Sets: 10 reps;Limitations Quad Sets Limitations: 10" hold Heel Slides: Right;10 reps Terminal Knee Extension: 10 reps Knee Extension: PROM;3 sets;Limitations Knee Extension Limitations: 30" holds Knee Flexion: PROM;3 sets;Limitations Knee Flexion Limitations: 30" holds Sidelying   Prone  Hamstring Curl: 15 reps;Limitations Hamstring Curl Limitations: TKF Hip Extension: 15 reps Prone Knee Hang: 5 minutes;Limitations Prone Knee Hang Limitations: with manual to hamstrings and joint mobs to increase extension  Other Prone Exercises: PROM for flexion 3x 30" Other Prone Exercises: TKE 10x 5"   Manual Therapy Other Manual Therapy: STM to Hamstrings in prone; PROM w/ grade I-III joint  mobs in prone to improve knee flexion and extension. x10 minutes   Physical Therapy Assessment and Plan PT Assessment and Plan Clinical Impression Statement: Today's treatment with more emphasis on knee extension to improve gait impairments.  Gait training required for appropriate heel to toe ambulation and to bend knee.  ROM improving both directions though still impaired.  PT Plan: Cont to progress strength and ROM, focus on extension, then flexion     Goals    Problem List Patient Active Problem List  Diagnoses  . HYPOTHYROIDISM  . Pure Hypercholesterolemia  . HYPERTENSION  . CORONARY ARTERY DISEASE  . ATRIAL FIBRILLATION  . PERIPHERAL VASCULAR DISEASE  . CHRONIC OBSTRUCTIVE PULMONARY DISEASE  . GERD  . DIVERTICULOSIS OF COLON  . KNEE, ARTHRITIS, DEGEN./OSTEO  . JOINT EFFUSION, KNEE  . KNEE PAIN  . OSTEOPOROSIS  . OTHER DYSPHAGIA  . NEPHROLITHIASIS, HX OF  . Hx-Major Cardiovasc Surg  . CHOLECYSTECTOMY, HX OF  . CORONARY ARTERY BYPASS GRAFT, HX OF  . Bruit  . AAA (abdominal aortic aneurysm)  . Knee pain  . Stiffness of joint, not elsewhere classified, lower leg  . Muscle weakness (generalized)  . Difficulty in walking    PT - End of Session Activity Tolerance: Patient tolerated treatment well General Behavior During Session: [facility] for tasks performed Cognition: [facility] for tasks performed  [name] 06/01/2011, 12:28 PM

## 2011-06-03 ENCOUNTER — Ambulatory Visit (HOSPITAL_COMMUNITY)
Admission: RE | Admit: 2011-06-03 | Discharge: 2011-06-03 | Disposition: A | Discharge status: Home / Self Care | Payer: Medicare Other | Source: Ambulatory Visit

## 2011-06-03 NOTE — Progress Notes (Signed)
Physical Therapy Treatment Patient Details  Name: [name] [name] MRN: [id number] Date of Birth: Jan 21, 1941  Today's Date: 06/03/2011 Time: 1105-1212 Time Calculation (min): 67 min Visit#: 6  of 12   Re-eval: 06/22/11  Charge: gait 38 min therex 19 min Ice 10 min  Subjective: Symptoms/Limitations Symptoms: Pt stated he felt light headed and requested an easy session,  going to [place], [place] for my god son's graduation.  BP at entrance 178/83, has taken BP medication and pain pills around 9:30 this am.  Pain scale 1-2/10. Pain Assessment Currently in Pain?: Yes Pain Score:   2 Pain Location: Knee Pain Orientation: Right  Objective:   Exercise/Treatments Aerobic Stationary Bike: 6' Seat 11 w/full ROM Tread Mill: Gait training x 8 min .50 cyc/sec ding Heel Raises: 20 reps;Limitations Heel Raises Limitations: Toe raises 20 reps Knee Flexion: 20 reps Functional Squat: 20 reps Gait Training: gait training 5 RT down line with emphasis on appropraite gait pattern Prone  Hamstring Curl: 2 sets;10 reps;3 seconds Hip Extension: 2 sets;10 reps   Modalities Modalities: Cryotherapy Cryotherapy Number Minutes Cryotherapy: 10 Minutes Cryotherapy Location: Knee Type of Cryotherapy: Ice pack  Physical Therapy Assessment and Plan PT Assessment and Plan Clinical Impression Statement: Today's treatment with emphasis on appropriate gait mechanics, multimodal cueing for proper gait pattern with constant vc for heel to toe and to bend knee.  Added gait trainer on TM today for equal stride length and stance phase.  Pt presented with improved gait at end of session.  ROM continues to be impaired, improving flexion gradually, extension at same measurement as initally an inpairment that limits proper gait mechanics. PT Plan: Continue to progress strength, ROM with focus on extension for appropriate gait.    Goals    Problem List Patient Active Problem List  Diagnoses  . HYPOTHYROIDISM  .  Pure Hypercholesterolemia  . HYPERTENSION  . CORONARY ARTERY DISEASE  . ATRIAL FIBRILLATION  . PERIPHERAL VASCULAR DISEASE  . CHRONIC OBSTRUCTIVE PULMONARY DISEASE  . GERD  . DIVERTICULOSIS OF COLON  . KNEE, ARTHRITIS, DEGEN./OSTEO  . JOINT EFFUSION, KNEE  . KNEE PAIN  . OSTEOPOROSIS  . OTHER DYSPHAGIA  . NEPHROLITHIASIS, HX OF  . Hx-Major Cardiovasc Surg  . CHOLECYSTECTOMY, HX OF  . CORONARY ARTERY BYPASS GRAFT, HX OF  . Bruit  . AAA (abdominal aortic aneurysm)  . Knee pain  . Stiffness of joint, not elsewhere classified, lower leg  . Muscle weakness (generalized)  . Difficulty in walking    PT - End of Session Activity Tolerance: Patient tolerated treatment well General Behavior During Session: [facility] for tasks performed Cognition: [facility] for tasks performed  [name] 06/03/2011, 12:14 PM

## 2011-06-06 ENCOUNTER — Ambulatory Visit (HOSPITAL_COMMUNITY): Payer: Medicare Other | Admitting: Physical Therapy

## 2011-06-07 ENCOUNTER — Ambulatory Visit (HOSPITAL_COMMUNITY)
Admission: RE | Admit: 2011-06-07 | Discharge: 2011-06-07 | Disposition: A | Discharge status: Home / Self Care | Payer: Medicare Other | Source: Ambulatory Visit | Attending: Family Medicine | Admitting: Family Medicine

## 2011-06-07 ENCOUNTER — Other Ambulatory Visit: Payer: Self-pay | Admitting: Cardiovascular Disease

## 2011-06-07 NOTE — Progress Notes (Addendum)
Physical Therapy Treatment Patient Details  Name: [name] [name] MRN: [id number] Date of Birth: 07/03/1940  Today's Date: 06/07/2011 Time: 4098-1191 Time Calculation (min): 80 min Visit#: 7  of 12   Re-eval: 06/22/11  Charge: therex: 50 min Gait 10 min Manual 10 min Ice 10 min  Subjective: Symptoms/Limitations Symptoms: Pt stated graduation went well, walking was fine it was the long ride for 3 hours made me really stiff.  Went to MD earlier today, stated I am allowed to drive now, wants me to continue outpatient PT for another 4 weeks.  Pain scale 2-3/10 pain meds about 45 min ago. Pain Assessment Currently in Pain?: Yes Pain Score:   3 Pain Location: Knee Pain Orientation: Right  Objective: AROM: lacking 10-96 PROM: lacking 7- 103  Exercise/Treatments Stretches Active Hamstring Stretch: 3 reps;30 seconds;Limitations Active Hamstring Stretch Limitations: with rope; Long sit h/s st. 2x 1' Quad Stretch: 3 reps;30 seconds;Limitations Lobbyist Limitations: manual Aerobic Stationary Bike: 8' for ROM seat 12-->11 Standing Heel Raises: 20 reps;Limitations Heel Raises Limitations: Toe raises 20 reps Knee Flexion: 20 reps;Limitations Knee Flexion Limitations: TKF 4" step Terminal Knee Extension: 10 reps;Theraband;Limitations Theraband Level (Terminal Knee Extension): Level 4 (Blue) Terminal Knee Extension Limitations: 10" holds Functional Squat: 20 reps;5 seconds Gait Training: gait training 5 RT down line with emphasis on appropraite gait pattern Supine Quad Sets: 15 reps;Limitations Quad Sets Limitations: 10" holds Heel Slides: Right;10 reps;Limitations Heel Slides Limitations: 10" holds prior PROM Knee Extension: PROM;3 sets;Limitations Knee Extension Limitations: 30" holds Knee Flexion: PROM;3 sets;Limitations Knee Flexion Limitations: 30" holds Prone  Hamstring Curl: 15 reps;Limitations Hamstring Curl Limitations: TKF Hip Extension: 15 reps Prone Knee Hang:  5 minutes;Limitations Prone Knee Hang Limitations: with manual to hamstrings and joint mobs to increase extension  Other Prone Exercises: PROM for flexion 3x 30"   Modalities Modalities: Cryotherapy Manual Therapy Other Manual Therapy: STM to Hamstrings in prone; PROM w/ grade I-III joint mobs in prone to improve knee flexion and extension. x10 minutes  Cryotherapy Number Minutes Cryotherapy: 10 Minutes Cryotherapy Location: Knee Type of Cryotherapy: Ice pack  Physical Therapy Assessment and Plan PT Assessment and Plan Clinical Impression Statement: ROM continues to limit proper gait pattern, emphasis on knee extension through session.  AROM improving with 10 degrees lacking extension and 96 degrees flexion.  PROM 7-103.  Gait improved at end of session. PT Plan: Continue to progress strength, ROM with focus on extension for appropriate gait.    Goals    Problem List Patient Active Problem List  Diagnoses  . HYPOTHYROIDISM  . Pure Hypercholesterolemia  . HYPERTENSION  . CORONARY ARTERY DISEASE  . ATRIAL FIBRILLATION  . PERIPHERAL VASCULAR DISEASE  . CHRONIC OBSTRUCTIVE PULMONARY DISEASE  . GERD  . DIVERTICULOSIS OF COLON  . KNEE, ARTHRITIS, DEGEN./OSTEO  . JOINT EFFUSION, KNEE  . KNEE PAIN  . OSTEOPOROSIS  . OTHER DYSPHAGIA  . NEPHROLITHIASIS, HX OF  . Hx-Major Cardiovasc Surg  . CHOLECYSTECTOMY, HX OF  . CORONARY ARTERY BYPASS GRAFT, HX OF  . Bruit  . AAA (abdominal aortic aneurysm)  . Knee pain  . Stiffness of joint, not elsewhere classified, lower leg  . Muscle weakness (generalized)  . Difficulty in walking    PT - End of Session Activity Tolerance: Patient tolerated treatment well General Behavior During Session: [facility] for tasks performed Cognition: [facility] for tasks performed  [name] [name] 06/07/2011, 5:20 PM

## 2011-06-08 ENCOUNTER — Ambulatory Visit (HOSPITAL_COMMUNITY)
Admission: RE | Admit: 2011-06-08 | Discharge: 2011-06-08 | Disposition: A | Discharge status: Home / Self Care | Payer: Medicare Other | Source: Ambulatory Visit | Attending: Family Medicine | Admitting: Family Medicine

## 2011-06-08 NOTE — Progress Notes (Signed)
Physical Therapy Treatment Patient Details  Name: [name] [name] MRN: [id number] Date of Birth: 02-Jul-1940  Today's Date: 06/08/2011 Time: 1305-1405 Time Calculation (min): 60 min Visit#: 8  of 12   Re-eval: 06/22/11 Charges: Therex x 40' Ice x 10'  Subjective: Symptoms/Limitations Symptoms: Pt reports that he sat for an hour and a half at a band recital last nigth and it made him very sore and stiff. Pt also reports that he drove to therapy today. Pain Assessment Currently in Pain?: Yes Pain Score:   1 Pain Location: Knee Pain Orientation: Right   Exercise/Treatments Aerobic Stationary Bike: 8' full rotation at seat 12 Standing Heel Raises: 20 reps;Limitations Heel Raises Limitations: Toe raises 20 reps (No UE assist with heel raises) Knee Flexion: 20 reps;Limitations Knee Flexion Limitations: TKF 4" step Terminal Knee Extension: 15 reps Theraband Level (Terminal Knee Extension): Level 4 (Blue) Lateral Step Up: 10 reps;Step Height: 4" Forward Step Up: 10 reps;Step Height: 4" Functional Squat: 20 reps;5 seconds Gait Training: gait training 5 RT down line with emphasis on appropraite gait pattern Seated Other Seated Knee Exercises: Long sit h/s st 2x 1' Supine Heel Slides: Right;10 reps;Limitations Heel Slides Limitations: 10" holds prior PROM Knee Extension: PROM;3 sets;Limitations Knee Extension Limitations: 30" holds Knee Flexion: PROM;3 sets;Limitations Knee Flexion Limitations: 30" holds   Modalities Modalities: Cryotherapy Cryotherapy Number Minutes Cryotherapy: 10 Minutes Cryotherapy Location: Knee (Right) Type of Cryotherapy: Ice pack  Physical Therapy Assessment and Plan PT Assessment and Plan Clinical Impression Statement: Pt coninues to ambulate with gait deviations due to decreased ROM. Pt requires vc's to improve heel-toe pattern and to increase knee flexion. Ice applied at end of session to limit swelling. PT Plan: Continue to progress per PT POC.      Problem List Patient Active Problem List  Diagnoses  . HYPOTHYROIDISM  . Pure Hypercholesterolemia  . HYPERTENSION  . CORONARY ARTERY DISEASE  . ATRIAL FIBRILLATION  . PERIPHERAL VASCULAR DISEASE  . CHRONIC OBSTRUCTIVE PULMONARY DISEASE  . GERD  . DIVERTICULOSIS OF COLON  . KNEE, ARTHRITIS, DEGEN./OSTEO  . JOINT EFFUSION, KNEE  . KNEE PAIN  . OSTEOPOROSIS  . OTHER DYSPHAGIA  . NEPHROLITHIASIS, HX OF  . Hx-Major Cardiovasc Surg  . CHOLECYSTECTOMY, HX OF  . CORONARY ARTERY BYPASS GRAFT, HX OF  . Bruit  . AAA (abdominal aortic aneurysm)  . Knee pain  . Stiffness of joint, not elsewhere classified, lower leg  . Muscle weakness (generalized)  . Difficulty in walking    PT - End of Session Activity Tolerance: Patient tolerated treatment well General Behavior During Session: [facility] for tasks performed Cognition: [facility] for tasks performed  Antonieta Iba 06/08/2011, 1:58 PM

## 2011-06-10 ENCOUNTER — Ambulatory Visit (HOSPITAL_COMMUNITY)
Admission: RE | Admit: 2011-06-10 | Discharge: 2011-06-10 | Disposition: A | Discharge status: Home / Self Care | Payer: Medicare Other | Source: Ambulatory Visit

## 2011-06-10 NOTE — Progress Notes (Addendum)
Physical Therapy Treatment Patient Details  Name: [name] [name] MRN: [id number] Date of Birth: 22-Dec-1940  Today's Date: 06/10/2011 Time: 1191-4782 Time Calculation (min): 70 min Visit#: 10  of 12   Re-eval: 06/22/11  Charge: therex 60 min Ice: 10 min  Subjective: Symptoms/Limitations Symptoms: Pt stated no pain, took pain meds at 9:30 this am.  Pt reported walking through [place] without difficulty. Pain Assessment Currently in Pain?: No/denies  Objective:   Exercise/Treatments Stretches Quad Stretch: 3 reps;30 seconds;Limitations Lobbyist Limitations: with rope Aerobic Stationary Bike: 8' @ 2.60full rotation at seat 12 Standing Heel Raises: 20 reps;Limitations Heel Raises Limitations: Toe raises 20 reps (No UE assist with heel/toe raises) Terminal Knee Extension: 20 reps;Theraband;Limitations Theraband Level (Terminal Knee Extension): Level 4 (Blue) Terminal Knee Extension Limitations: 10" holds Lateral Step Up: 10 reps;Step Height: 4" Forward Step Up: 10 reps;Step Height: 4" Step Down: 10 reps;Step Height: 4" Functional Squat: 20 reps;5 seconds;Limitations Functional Squat Limitations: with no HHA for better posture Gait Training: gait training 5 RT down line with emphasis on appropraite gait pattern Supine Quad Sets: 10 reps;Limitations Quad Sets Limitations: 10" holds Heel Slides: Right;10 reps;Limitations Heel Slides Limitations: 10" holds prior PROM Knee Extension: PROM;3 sets;Limitations Knee Extension Limitations: 30" holds Knee Flexion: PROM;3 sets;Limitations Knee Flexion Limitations: 30" holds   Modalities Modalities: Cryotherapy Cryotherapy Number Minutes Cryotherapy: 10 Minutes Cryotherapy Location: Knee Type of Cryotherapy: Ice pack  Physical Therapy Assessment and Plan PT Assessment and Plan Clinical Impression Statement: Pt continues to ambulate with limited knee extension during heel strike as well as knee flexion during swing phase to  progress the next step. Pt required multimodal cueing for proper tech/form with steps and TKE for R quad strengthening and most effective ROM. PT Plan: Continue with focus on proper gait mechanics, R LE strengthening and ROM.  Add SAQ as time allow.    Goals    Problem List Patient Active Problem List  Diagnoses  . HYPOTHYROIDISM  . Pure Hypercholesterolemia  . HYPERTENSION  . CORONARY ARTERY DISEASE  . ATRIAL FIBRILLATION  . PERIPHERAL VASCULAR DISEASE  . CHRONIC OBSTRUCTIVE PULMONARY DISEASE  . GERD  . DIVERTICULOSIS OF COLON  . KNEE, ARTHRITIS, DEGEN./OSTEO  . JOINT EFFUSION, KNEE  . KNEE PAIN  . OSTEOPOROSIS  . OTHER DYSPHAGIA  . NEPHROLITHIASIS, HX OF  . Hx-Major Cardiovasc Surg  . CHOLECYSTECTOMY, HX OF  . CORONARY ARTERY BYPASS GRAFT, HX OF  . Bruit  . AAA (abdominal aortic aneurysm)  . Knee pain  . Stiffness of joint, not elsewhere classified, lower leg  . Muscle weakness (generalized)  . Difficulty in walking    PT - End of Session Activity Tolerance: Patient tolerated treatment well General Behavior During Session: [facility] for tasks performed Cognition: [facility] for tasks performed  [name] 06/10/2011, 12:32 PM

## 2011-06-16 ENCOUNTER — Ambulatory Visit (HOSPITAL_COMMUNITY)
Admission: RE | Admit: 2011-06-16 | Discharge: 2011-06-16 | Disposition: A | Discharge status: Home / Self Care | Payer: Medicare Other | Source: Ambulatory Visit | Attending: Family Medicine | Admitting: Family Medicine

## 2011-06-16 NOTE — Progress Notes (Signed)
Physical Therapy Treatment Patient Details  Name: [name] [name] MRN: [id number] Date of Birth: Apr 21, 1941  Today's Date: 06/16/2011 Time: 13:05-14:00 55 mins  Charges: 10 ice, 45 TE Visit#: 11 of 12 Re-eval: 06/22/11  Subjective: Reports compliance with HEP, "not really" in any pain  Exercise/Treatments Stretches Passive Hamstring Stretch: 30 seconds;2 reps Aerobic Stationary Bike: 8' @ 2.30full rotation at seat 12 Standing Heel Raises: 20 reps;Limitations (no hands) Heel Raises Limitations: toe raises x 20 wtih bil upper extremity assist.   Knee Flexion: 20 reps;Limitations Terminal Knee Extension: 20 reps;Theraband;Limitations Theraband Level (Terminal Knee Extension): Level 4 (Blue) Terminal Knee Extension Limitations: 10" holds Lateral Step Up: Step Height: 4";10 reps;Hand Hold: 0 Forward Step Up: 10 reps;Step Height: 4";Hand Hold: 2 Step Down: 10 reps;Step Height: 4";Hand Hold: 1 Functional Squat: 20 reps;5 seconds;Limitations Functional Squat Limitations: with no HHA for better posture Wall Squat: 10 reps SLS: R leg 9 sec max L leg 7 sec max multiple tries Supine Quad Sets: 10 reps;Limitations Heel Slides: Right;10 reps;Limitations Heel Slides Limitations: 10" holds prior PROM Knee Extension: PROM;3 sets;Limitations Knee Extension Limitations: 30" holds Knee Flexion: PROM;3 sets;Limitations Knee Flexion Limitations: 30" holds  Modalities Modalities: Cryotherapy Cryotherapy Number Minutes Cryotherapy: 10 Minutes Cryotherapy Location: Knee Type of Cryotherapy: Ice pack  Physical Therapy Assessment and Plan Patient's gait pattern is starting to be more normalized without an assistive device. He is still lacking both flex and ext AROM.    Plan: Add SAQ, continue to be aggressive with end ROM and progress resistive exercises as able.    Problem List Patient Active Problem List  Diagnoses  . HYPOTHYROIDISM  . Pure Hypercholesterolemia  . HYPERTENSION  .  CORONARY ARTERY DISEASE  . ATRIAL FIBRILLATION  . PERIPHERAL VASCULAR DISEASE  . CHRONIC OBSTRUCTIVE PULMONARY DISEASE  . GERD  . DIVERTICULOSIS OF COLON  . KNEE, ARTHRITIS, DEGEN./OSTEO  . JOINT EFFUSION, KNEE  . KNEE PAIN  . OSTEOPOROSIS  . OTHER DYSPHAGIA  . NEPHROLITHIASIS, HX OF  . Hx-Major Cardiovasc Surg  . CHOLECYSTECTOMY, HX OF  . CORONARY ARTERY BYPASS GRAFT, HX OF  . Bruit  . AAA (abdominal aortic aneurysm)  . Knee pain  . Stiffness of joint, not elsewhere classified, lower leg  . Muscle weakness (generalized)  . Difficulty in walking   Lawrie Tunks B. Sidrah Harden, PT, DPT  06/16/2011, 7:26 PM

## 2011-06-20 ENCOUNTER — Ambulatory Visit (HOSPITAL_COMMUNITY)
Admission: RE | Admit: 2011-06-20 | Discharge: 2011-06-20 | Disposition: A | Discharge status: Home / Self Care | Payer: Medicare Other | Source: Ambulatory Visit | Attending: Family Medicine | Admitting: Family Medicine

## 2011-06-20 NOTE — Progress Notes (Signed)
Physical Therapy Re-evaluation  Patient Details  Name: [name] [name] MRN: [id number] Date of Birth: [birth year]  Today's Date: 06/20/2011 Time: 4098-1191 Time Calculation (min): 53 min Charges: 1 re-eval, 1 ROM, 10 ice, 23 TE Visit#: 12  of 24   Re-eval: 07/20/11    Past Medical History:  Past Medical History  Diagnosis Date  . HYPOTHYROIDISM   . Pure hypercholesterolemia   . HYPERTENSION   . GERD   . DIVERTICULOSIS OF COLON   . JOINT EFFUSION, KNEE   . OSTEOPOROSIS   . Other dysphagia   . NEPHROLITHIASIS, HX OF   . Myocardial infarction     1994   . CORONARY ARTERY DISEASE   . [place]-induced atrial fibrillation     no hx of reported at preop visit of 04/21/11   . PERIPHERAL VASCULAR DISEASE     AAA - 1994 with reimplant of renals   . Peripheral vascular disease     subclavian stenosis PTA - 3/08   . CHRONIC OBSTRUCTIVE PULMONARY DISEASE     pt denied at visit of 04/21/11   . Chronic kidney disease     AAA repair with reimplant of renals   . KNEE, ARTHRITIS, DEGEN./OSTEO     right knee    Past Surgical History:  Past Surgical History  Procedure Date  . Abdominal aortic aneurysm repair     with reimplantation of renals   . Gallbladder surgery 2004   . Knee surgery   . Vascular surgery     AAA  . Cardiac catheterization     2008  . Coronary artery bypass graft     1995  . Joint replacement     partial knee replacement on left 2002   . Other surgical history     left subclavian stenosis surgery PTA 08/2006   . Other surgical history     carotid surgery on right 2004   . Total knee arthroplasty 04/29/2011    Procedure: TOTAL KNEE ARTHROPLASTY;  Surgeon: Loanne Drilling;  Location: [facility];  Service: Orthopedics;  Laterality: Right;    Subjective Symptoms/Limitations Symptoms: No pain right now, premedicated before his appointment.   Pain Assessment Currently in Pain?: No/denies Pain Score: 0-No pain Pain Location: Knee Pain Orientation: Right Pain  Type: Surgical pain  Sensation/Coordination/Flexibility/Functional Tests Functional Tests Functional Tests: 5 STS w/o UE support 10.15 sec favoring R LE.  Functional Tests: LEFS: 51/80  Assessment RLE AROM (degrees) Right Knee Extension 0-130: -10  Right Knee Flexion 0-140: 94  RLE PROM (degrees) Right Knee Extension 0-130: -8  Right Knee Flexion 0-140: 96  RLE Strength Right Hip Flexion: 5/5 Right Hip Extension: 5/5 Right Hip ABduction: 5/5 Right Hip ADduction: 5/5 Right Knee Flexion: 5/5 Right Knee Extension: 4/5  Exercise/Treatments Mobility/Balance  Static Standing Balance Single Leg Stance - Right Leg: 4  Single Leg Stance - Left Leg: 11   Aerobic Stationary Bike: 8' @ 2.83full rotation at seat 12 upine Quad Sets: 10 reps;Limitations Quad Sets Limitations: 10" holds Straight Leg Raises: 15 reps Knee Extension: PROM;3 sets;Limitations Knee Extension Limitations: 30" holds Prone  Other Prone Exercises: PROM 30" each x3  Modalities Modalities: Cryotherapy Cryotherapy Number Minutes Cryotherapy: 10 Minutes (supine with heel proped and 5# weight on knee for ext.  )  Physical Therapy Assessment and Plan  Clinical Impression Statement: Re-eval completed today.  The patient has improved most with his strength and his pain.  He continues to struggle with full ROM past about 95 degrees.  His gait pattern is improving and he comes to therapy without his cane now.  He has met 3/4 STGs and  2/5 LTGs.   Rehab Potential: Good PT Frequency: Min 3X/week PT Duration: 4 weeks PT Treatment/Interventions: DME instruction;Gait training;Stair training;Functional mobility training;Therapeutic activities;Therapeutic exercise;Balance training;Neuromuscular re-education;Patient/family education PT Plan: Continue with aggressive ROM, balance training and stair training for 4 more weeks.      Goals Home Exercise Program PT Goal: Perform Home Exercise Program - Progress: Met PT Short  Term Goals Time to Complete Short Term Goals: 2 weeks PT Short Term Goal 1: Pt will report pain less than 4/10 for 50% of his day. PT Short Term Goal 1 - Progress: Met PT Short Term Goal 2: Pt will demonstrate AROM PT Short Term Goal 2 - Progress: Met PT Short Term Goal 3: Pt will improve R SLS on static surface x10 sec  PT Short Term Goal 3 - Progress: Not met PT Short Term Goal 4: Pt will improve R LE muscle grade to ambulate independently in the community PT Short Term Goal 4 - Progress: Met PT Long Term Goals Time to Complete Long Term Goals: 4 weeks PT Long Term Goal 1: Pt will report pain less than 3/10 for 75% of his day for improved quality of life  PT Long Term Goal 1 - Progress: Met PT Long Term Goal 2: Pt will improve 5 STS w/o UE support in 10 sec PT Long Term Goal 2 - Progress: Not met Long Term Goal 3: Pt will improve RLE ROM to [facility] in order to ambulate with appropriate gait mechanics independently Long Term Goal 3 Progress: Not met Long Term Goal 4: Pt will improve LE strength to [facility] in order to ascend and descend 10 stairs with 1 handrail with reciprocal pattern Long Term Goal 4 Progress: Met PT Long Term Goal 5: Pt will improve LEFS by 9 points for improved percieved functional ability Long Term Goal 5 Progress: Not met  Problem List Patient Active Problem List  Diagnoses  . HYPOTHYROIDISM  . Pure Hypercholesterolemia  . HYPERTENSION  . CORONARY ARTERY DISEASE  . ATRIAL FIBRILLATION  . PERIPHERAL VASCULAR DISEASE  . CHRONIC OBSTRUCTIVE PULMONARY DISEASE  . GERD  . DIVERTICULOSIS OF COLON  . KNEE, ARTHRITIS, DEGEN./OSTEO  . JOINT EFFUSION, KNEE  . KNEE PAIN  . OSTEOPOROSIS  . OTHER DYSPHAGIA  . NEPHROLITHIASIS, HX OF  . Hx-Major Cardiovasc Surg  . CHOLECYSTECTOMY, HX OF  . CORONARY ARTERY BYPASS GRAFT, HX OF  . Bruit  . AAA (abdominal aortic aneurysm)  . Knee pain  . Stiffness of joint, not elsewhere classified, lower leg  . Muscle weakness  (generalized)  . Difficulty in walking    Pressley Tadesse B. Zen Cedillos, PT, DPT  06/20/2011, 2:41 PM  Physician Documentation Your signature is required to indicate approval of the treatment plan as stated above.  Please sign and either send electronically or make a copy of this report for your files and return this physician signed original.   Please mark one 1.__approve of plan  2. ___approve of plan with the following conditions.   ______________________________                                                          _____________________ Physician Signature  Date  

## 2011-06-22 ENCOUNTER — Ambulatory Visit (HOSPITAL_COMMUNITY): Payer: Medicare Other

## 2011-06-24 ENCOUNTER — Ambulatory Visit (HOSPITAL_COMMUNITY)
Admission: RE | Admit: 2011-06-24 | Discharge: 2011-06-24 | Disposition: A | Discharge status: Home / Self Care | Payer: Medicare Other | Source: Ambulatory Visit | Attending: Orthopedic Surgery | Admitting: Orthopedic Surgery

## 2011-06-24 DIAGNOSIS — M25569 Pain in unspecified knee: Secondary | ICD-10-CM | POA: Insufficient documentation

## 2011-06-24 DIAGNOSIS — M25669 Stiffness of unspecified knee, not elsewhere classified: Secondary | ICD-10-CM | POA: Diagnosis not present

## 2011-06-24 DIAGNOSIS — IMO0001 Reserved for inherently not codable concepts without codable children: Secondary | ICD-10-CM | POA: Diagnosis not present

## 2011-06-24 DIAGNOSIS — I10 Essential (primary) hypertension: Secondary | ICD-10-CM | POA: Insufficient documentation

## 2011-06-24 DIAGNOSIS — R262 Difficulty in walking, not elsewhere classified: Secondary | ICD-10-CM | POA: Diagnosis not present

## 2011-06-24 NOTE — Progress Notes (Signed)
Physical Therapy Treatment Patient Details  Name: [name] [name] MRN: [id number] Date of Birth: 26-Apr-1941  Today's Date: 06/24/2011 Time: 9147-8295 Time Calculation (min): 67 min Visit#: 13  of 24   Re-eval: 07/20/11  Charge: therex: 57 min Ice 10 min  Subjective: Symptoms/Limitations Symptoms: No pain right now, took pain meds around 7:30 this morning. Pain Assessment Currently in Pain?: No/denies  Objective:   Exercise/Treatments Stretches Active Hamstring Stretch: 3 reps;30 seconds;Limitations Active Hamstring Stretch Limitations: with rope Aerobic Stationary Bike: 8' @ 2.15full rotation at seat 12 Standing Heel Raises: 20 reps;Limitations Heel Raises Limitations: toe raises x 20 wtih bil upper extremity assist.   Terminal Knee Extension: 20 reps;Theraband;Limitations Theraband Level (Terminal Knee Extension): Level 4 (Blue) Terminal Knee Extension Limitations: 10" holds Lateral Step Up: 15 reps;Step Height: 4" Forward Step Up: 15 reps;Step Height: 4" Step Down: 15 reps;Step Height: 4" Rocker Board: 2 minutes;Limitations Rocker Board Limitations: R/L; A/P SLS: SLS R 9 sec: L 15 sec Other Standing Knee Exercises: Tandem stance 1x 30" each foot in front with intermittent HHA Supine Quad Sets: 15 reps;Limitations Quad Sets Limitations: 10" holds Heel Slides: Right;15 reps;Limitations Heel Slides Limitations: 10" holds prior PROM Knee Extension: PROM;3 sets;Limitations Knee Extension Limitations: 3x 30" aggressive Knee Flexion: PROM;3 sets;Limitations Knee Flexion Limitations: 3x 30" holds aggressive  Physical Therapy Assessment and Plan PT Assessment and Plan Clinical Impression Statement: Pt tolerated well towards treatment, PROM measurement lacking 6-104 degrees.  Balance still impaired, began tandem stance pt requred intermittent HHA to regain balance. PT Plan: Continue with aggressive ROM, balance training and stair training    Goals    Problem  List Patient Active Problem List  Diagnoses  . HYPOTHYROIDISM  . Pure Hypercholesterolemia  . HYPERTENSION  . CORONARY ARTERY DISEASE  . ATRIAL FIBRILLATION  . PERIPHERAL VASCULAR DISEASE  . CHRONIC OBSTRUCTIVE PULMONARY DISEASE  . GERD  . DIVERTICULOSIS OF COLON  . KNEE, ARTHRITIS, DEGEN./OSTEO  . JOINT EFFUSION, KNEE  . KNEE PAIN  . OSTEOPOROSIS  . OTHER DYSPHAGIA  . NEPHROLITHIASIS, HX OF  . Hx-Major Cardiovasc Surg  . CHOLECYSTECTOMY, HX OF  . CORONARY ARTERY BYPASS GRAFT, HX OF  . Bruit  . AAA (abdominal aortic aneurysm)  . Knee pain  . Stiffness of joint, not elsewhere classified, lower leg  . Muscle weakness (generalized)  . Difficulty in walking    PT - End of Session Activity Tolerance: Patient tolerated treatment well General Behavior During Session: [facility] for tasks performed Cognition: [facility] for tasks performed  [name] 06/24/2011, 10:02 AM

## 2011-06-27 ENCOUNTER — Ambulatory Visit (HOSPITAL_COMMUNITY)
Admission: RE | Admit: 2011-06-27 | Discharge: 2011-06-27 | Disposition: A | Discharge status: Home / Self Care | Payer: Medicare Other | Source: Ambulatory Visit

## 2011-06-27 NOTE — Progress Notes (Signed)
Physical Therapy Treatment Patient Details  Name: [name] [name] MRN: [id number] Date of Birth: 02-Jun-1941  Today's Date: 06/27/2011 Time: 1302-1405 Time Calculation (min): 63 min Visit#: 14  of 24   Re-eval: 07/20/11 Charges:  therex 36', manual 8', ice 10'    Subjective: Symptoms/Limitations Symptoms: Pt. reports he keeps a constant dull ache and always takes his pain med prior to therapy, currently no pain in R knee. Pain Assessment Currently in Pain?: No/denies   Exercise/Treatments Stretches Active Hamstring Stretch: 3 reps;30 seconds;Limitations Active Hamstring Stretch Limitations: with rope Aerobic Stationary Bike: 8' @ 3.0 full rotation at seat 12 Standing Heel Raises: 20 reps Heel Raises Limitations: toe raises x 20 wtih bil upper extremity assist.   Knee Flexion: 15 reps Knee Flexion Limitations: TKF 6" step Terminal Knee Extension: 20 reps Theraband Level (Terminal Knee Extension): Level 4 (Blue) Terminal Knee Extension Limitations: 10" holds Lateral Step Up: 15 reps;Step Height: 4" Forward Step Up: 15 reps;Step Height: 4" Step Down: 15 reps;Step Height: 4" SLS: SLS R 9 sec: L 20 sec Other Standing Knee Exercises: Tandem stance 30" each foot in front with intermittent HHA Supine Quad Sets: 15 reps;Limitations Quad Sets Limitations: 10"hold prior to PROM Heel Slides: Right;15 reps;Limitations Heel Slides Limitations: 10" holds prior PROM Straight Leg Raises: 15 reps Knee Extension: PROM;3 sets;Limitations Knee Extension Limitations: AROM lacking 8 degrees, PROM N/T Knee Flexion: PROM;3 sets;Limitations Knee Flexion Limitations: 3x 30" holds aggressive   Physical Therapy Assessment and Plan PT Assessment and Plan Clinical Impression Statement: Pt.able to achieve AROM lacking 8 to 100 degrees today; 105 degrees PROM flexion.  No difficulty with any other therex.     Problem List Patient Active Problem List  Diagnoses  . HYPOTHYROIDISM  . Pure  Hypercholesterolemia  . HYPERTENSION  . CORONARY ARTERY DISEASE  . ATRIAL FIBRILLATION  . PERIPHERAL VASCULAR DISEASE  . CHRONIC OBSTRUCTIVE PULMONARY DISEASE  . GERD  . DIVERTICULOSIS OF COLON  . KNEE, ARTHRITIS, DEGEN./OSTEO  . JOINT EFFUSION, KNEE  . KNEE PAIN  . OSTEOPOROSIS  . OTHER DYSPHAGIA  . NEPHROLITHIASIS, HX OF  . Hx-Major Cardiovasc Surg  . CHOLECYSTECTOMY, HX OF  . CORONARY ARTERY BYPASS GRAFT, HX OF  . Bruit  . AAA (abdominal aortic aneurysm)  . Knee pain  . Stiffness of joint, not elsewhere classified, lower leg  . Muscle weakness (generalized)  . Difficulty in walking    PT - End of Session Activity Tolerance: Patient tolerated treatment well General Behavior During Session: [facility] for tasks performed Cognition: [facility] for tasks performed PT Plan of Care PT Home Exercise Plan: Continue to progress per POC;  Levern Pitter B. Bascom Levels, PTA 06/27/2011, 3:46 PM

## 2011-06-29 ENCOUNTER — Ambulatory Visit (HOSPITAL_COMMUNITY)
Admission: RE | Admit: 2011-06-29 | Discharge: 2011-06-29 | Disposition: A | Discharge status: Home / Self Care | Payer: Medicare Other | Source: Ambulatory Visit | Attending: Orthopedic Surgery | Admitting: Orthopedic Surgery

## 2011-06-29 NOTE — Progress Notes (Addendum)
Physical Therapy Treatment Patient Details  Name: [name] [name] MRN: [id number] Date of Birth: 1941-05-16  Today's Date: 06/29/2011 Time: 1350-1456 Time Calculation (min): 66 min Visit#: 15  of 24   Re-eval: 07/20/11 Charges: Therex x 46' Ice x 10'  Subjective: Symptoms/Limitations Symptoms: Pt reports that he went to a swim meet Monday and sitting really made his knee hurt. Pain Assessment Currently in Pain?: No/denies Pain Score: 0-No pain Pain Location: Knee Pain Orientation: Right   Exercise/Treatments Aerobic Stationary Bike: 8' @ 3.0 full rotation at seat 12 Standing Heel Raises: 20 reps Heel Raises Limitations: toe raises x 20 wtih bil upper extremity assist.   Knee Flexion: 20 reps Knee Flexion Limitations: TKF 6" step Terminal Knee Extension: 20 reps Theraband Level (Terminal Knee Extension): Level 4 (Blue) Terminal Knee Extension Limitations: 10" holds Lateral Step Up: 20 reps;Right;Step Height: 4" Forward Step Up: 20 reps;Right;Step Height: 4" Step Down: 20 reps;Step Height: 4";Right Functional Squat: 20 reps SLS: SLS R:10" L:20" Other Standing Knee Exercises: Tandem stance B x1' Other Standing Knee Exercises: Tandem x 1 RT Supine Knee Extension: PROM;3 sets;Limitations Knee Flexion: PROM;3 sets  Physical Therapy Assessment and Plan PT Assessment and Plan Clinical Impression Statement: Pt presents with increased stability with tandem stance this session. Pt able to hold tandem stance B for 1' w/o HHA. Progressed to tandem gait this session with VC's to look up and slow down. Pt reports no increase in pain at end of session. Ice applied at end of session to limit pain/swelling. PT Plan: Continue to progress per PT POC.     Problem List Patient Active Problem List  Diagnoses  . HYPOTHYROIDISM  . Pure Hypercholesterolemia  . HYPERTENSION  . CORONARY ARTERY DISEASE  . ATRIAL FIBRILLATION  . PERIPHERAL VASCULAR DISEASE  . CHRONIC OBSTRUCTIVE PULMONARY  DISEASE  . GERD  . DIVERTICULOSIS OF COLON  . KNEE, ARTHRITIS, DEGEN./OSTEO  . JOINT EFFUSION, KNEE  . KNEE PAIN  . OSTEOPOROSIS  . OTHER DYSPHAGIA  . NEPHROLITHIASIS, HX OF  . Hx-Major Cardiovasc Surg  . CHOLECYSTECTOMY, HX OF  . CORONARY ARTERY BYPASS GRAFT, HX OF  . Bruit  . AAA (abdominal aortic aneurysm)  . Knee pain  . Stiffness of joint, not elsewhere classified, lower leg  . Muscle weakness (generalized)  . Difficulty in walking    PT - End of Session Activity Tolerance: Patient tolerated treatment well General Behavior During Session: [facility] for tasks performed Cognition: [facility] for tasks performed  Antonieta Iba 06/29/2011, 3:04 PM

## 2011-07-01 ENCOUNTER — Ambulatory Visit (HOSPITAL_COMMUNITY)
Admission: RE | Admit: 2011-07-01 | Discharge: 2011-07-01 | Disposition: A | Discharge status: Home / Self Care | Payer: Medicare Other | Source: Ambulatory Visit

## 2011-07-01 NOTE — Progress Notes (Signed)
Physical Therapy Treatment Patient Details  Name: CORLISS LAMARTINA MRN: [id number] Date of Birth: Feb 28, 1941  Today's Date: 07/01/2011 Time: 8295-6213 Time Calculation (min): 61 min Visit#: 16  of 24   Re-eval: 07/20/11  Charge: therex 43 min Ice 10 min  Subjective: Symptoms/Limitations Symptoms: No pain currently, took pain meds about an hour and a half ago. Pain Assessment Currently in Pain?: No/denies  Objective:   Exercise/Treatments Stretches Active Hamstring Stretch: 3 reps;30 seconds;Limitations Active Hamstring Stretch Limitations: with rope Aerobic Stationary Bike: 8' @ 3.0 full rotation at seat 12 Standing Heel Raises: 20 reps Heel Raises Limitations: toe raises x 20 wtih bil upper extremity assist.   Terminal Knee Extension: 20 reps Theraband Level (Terminal Knee Extension): Level 4 (Blue) Terminal Knee Extension Limitations: 10" holds Wall Squat: 15 reps;10 seconds Other Standing Knee Exercises: Tandem stance B x1' Supine Quad Sets: 15 reps;Limitations Quad Sets Limitations: 10" hold Heel Slides: Right;15 reps;Limitations Heel Slides Limitations: 10" holds prior PROM Knee Extension: PROM;3 sets;Limitations Knee Extension Limitations: 30" holds Knee Flexion: PROM;3 sets Knee Flexion Limitations: 3x 30" holds aggressive  Physical Therapy Assessment and Plan PT Assessment and Plan Clinical Impression Statement: Session focus on ROM, pt able to complete all therex correctly with min cueing for technique with TKE for best outcome. PT Plan: Trial with biodex PROM next session.    Goals    Problem List Patient Active Problem List  Diagnoses  . HYPOTHYROIDISM  . Pure Hypercholesterolemia  . HYPERTENSION  . CORONARY ARTERY DISEASE  . ATRIAL FIBRILLATION  . PERIPHERAL VASCULAR DISEASE  . CHRONIC OBSTRUCTIVE PULMONARY DISEASE  . GERD  . DIVERTICULOSIS OF COLON  . KNEE, ARTHRITIS, DEGEN./OSTEO  . JOINT EFFUSION, KNEE  . KNEE PAIN  . OSTEOPOROSIS  .  OTHER DYSPHAGIA  . NEPHROLITHIASIS, HX OF  . Hx-Major Cardiovasc Surg  . CHOLECYSTECTOMY, HX OF  . CORONARY ARTERY BYPASS GRAFT, HX OF  . Bruit  . AAA (abdominal aortic aneurysm)  . Knee pain  . Stiffness of joint, not elsewhere classified, lower leg  . Muscle weakness (generalized)  . Difficulty in walking    PT - End of Session Activity Tolerance: Patient tolerated treatment well General Behavior During Session: [facility] for tasks performed Cognition: [facility] for tasks performed  [name] 07/01/2011, 4:45 PM

## 2011-07-04 ENCOUNTER — Ambulatory Visit (HOSPITAL_COMMUNITY)
Admission: RE | Admit: 2011-07-04 | Discharge: 2011-07-04 | Disposition: A | Discharge status: Home / Self Care | Payer: Medicare Other | Source: Ambulatory Visit

## 2011-07-04 NOTE — Progress Notes (Signed)
Physical Therapy Treatment Patient Details  Name: [name] [name] MRN: [id number] Date of Birth: 05-09-1941  Today's Date: 07/04/2011 Time: 2952-8413 Time Calculation (min): 55 min Visit#: 17  of 24   Re-eval: 07/20/11 Charges:  therex 40', icepack 10'    Subjective: Symptoms/Limitations Symptoms: Pt. states knee is not quite as bad, but his Left back/hip gave him a fit Sunday after washing his truck on Saturday.  Pt. reports it is better today. Pain Assessment Currently in Pain?: No/denies   Exercise/Treatments Aerobic Stationary Bike: 8' @ 3.0 full rotation at seat 12 Standing Heel Raises: 20 reps Heel Raises Limitations: toe raises x 20 wtih bil upper extremity assist.   Knee Flexion: 20 reps Knee Flexion Limitations: TKF 6" step Lateral Step Up: 15 reps;Step Height: 6" Forward Step Up: 15 reps;Step Height: 6" Step Down: 15 reps;Step Height: 6" Wall Squat: 15 reps SLS: SLS R:16" L:10" Other Standing Knee Exercises: Tandem gait 1RT Seated Other Seated Knee Exercises: BIODEX add next visit Supine Quad Sets: 20 reps;Limitations Quad Sets Limitations: 10" hold prior to PROM Heel Slides: 20 reps Heel Slides Limitations: 10" holds prior PROM Knee Extension: PROM Knee Flexion: PROM   Modalities Modalities: Cryotherapy Cryotherapy Number Minutes Cryotherapy: 10 Minutes Cryotherapy Location: Knee (Right) Type of Cryotherapy: Ice pack  Physical Therapy Assessment and Plan PT Assessment and Plan Clinical Impression Statement: Increased to 6" step and increased difficulty of tandem activity to dynamic gait without difficulty.  Continues to make slow gains with ROM due to painful endfeel.   PT Plan: Continue per POC; add biodex for PROM and write up ROM measurements/short re-eval for MD appt. Thursday.     Problem List Patient Active Problem List  Diagnoses  . HYPOTHYROIDISM  . Pure Hypercholesterolemia  . HYPERTENSION  . CORONARY ARTERY DISEASE  . ATRIAL  FIBRILLATION  . PERIPHERAL VASCULAR DISEASE  . CHRONIC OBSTRUCTIVE PULMONARY DISEASE  . GERD  . DIVERTICULOSIS OF COLON  . KNEE, ARTHRITIS, DEGEN./OSTEO  . JOINT EFFUSION, KNEE  . KNEE PAIN  . OSTEOPOROSIS  . OTHER DYSPHAGIA  . NEPHROLITHIASIS, HX OF  . Hx-Major Cardiovasc Surg  . CHOLECYSTECTOMY, HX OF  . CORONARY ARTERY BYPASS GRAFT, HX OF  . Bruit  . AAA (abdominal aortic aneurysm)  . Knee pain  . Stiffness of joint, not elsewhere classified, lower leg  . Muscle weakness (generalized)  . Difficulty in walking    PT - End of Session Activity Tolerance: Patient tolerated treatment well General Behavior During Session: [facility] for tasks performed Cognition: [facility] for tasks performed  Amy B. Bascom Levels, PTA 07/04/2011, 1:52 PM

## 2011-07-06 ENCOUNTER — Ambulatory Visit (HOSPITAL_COMMUNITY)
Admission: RE | Admit: 2011-07-06 | Discharge: 2011-07-06 | Disposition: A | Discharge status: Home / Self Care | Payer: Medicare Other | Source: Ambulatory Visit

## 2011-07-06 NOTE — Progress Notes (Addendum)
Physical Therapy Treatment/ Re-evaluation Patient Details  Name: [name] [name] MRN: [id number] Date of Birth: 05/12/1941  Today's Date: 07/06/2011 Time: 4098-1191 Time Calculation (min): 66 min Visit#: 18  of 24   Re-eval: 07/20/11 Diagnosis: R TKR Next MD Visit: 07/07/2011 Charges:  therex 36', ROM test, ice 10'   Subjective Symptoms/Limitations Symptoms: Pt. states his knee has been doing good.  Reports mostly stiffness rather than pain.  Pain varied 0-2/10, but mostly painfree.  States his back is bothering him the most. Pain Assessment Currently in Pain?: No/denies  Objective: Functional Tests Functional Tests: 5 STS w/o UE support 9.5 sec equally  Functional Tests: LEFS: 56/80  RLE AROM (degrees) Right Knee Extension 0-130: -10  Right Knee Flexion 0-140: 100  RLE PROM (degrees) Right Knee Extension 0-130: -6  Right Knee Flexion 0-140: 108   RLE Strength Right Knee Extension: 4/5  All others are 5/5  Exercise/Treatments Mobility/Balance  Static Standing Balance Single Leg Stance - Right Leg: 16  Single Leg Stance - Left Leg: 20   Stretches Knee: Self-Stretch to increase Flexion: 3 reps;30 seconds;Limitations Knee: Self-Stretch Limitations: Chair Stretch Aerobic Stationary Bike: 8' @ 3.0 full rotation at seat 12 Standing Heel Raises: 20 reps Heel Raises Limitations: toe raises x 20 wtih bil upper extremity assist.   Knee Flexion: 20 reps Knee Flexion Limitations: TKF 6" step Lateral Step Up: 15 reps;Step Height: 6" Wall Squat: 15 reps Seated Other Seated Knee Exercises: BIODEX PROM; flexion 95%, ext. 85% Supine Knee Extension: PROM Knee Flexion: PROM   Modalities Modalities: Cryotherapy Cryotherapy Number Minutes Cryotherapy: 10 Minutes Cryotherapy Location: Knee Type of Cryotherapy: Ice pack  Physical Therapy Assessment and Plan PT Assessment and Plan Clinical Impression Statement: Pt. Has met all STG's, 3/5 LTG's.   Pt. with improved  flexion, however still lacking gains in extension.  Continued antalgic gait.  Added biodex and chair stretch for ROM.   PT Plan: Suggest to PT to Continue and focus on ROM and work towards meeting all LTG's to return to previous function.    Goals PT Short Term Goals Time to Complete Short Term Goals: 2 weeks PT Short Term Goal 1: Pt will report pain less than 4/10 for 50% of his day. PT Short Term Goal 1 - Progress: Met PT Short Term Goal 2: Pt will demonstrate AROM PT Short Term Goal 2 - Progress: Met PT Short Term Goal 3: Pt will improve R SLS on static surface x10 sec  PT Short Term Goal 3 - Progress: Met PT Short Term Goal 4: Pt will improve R LE muscle grade to ambulate independently in the community PT Short Term Goal 4 - Progress: Met PT Long Term Goals Time to Complete Long Term Goals: 4 weeks PT Long Term Goal 1: Pt will report pain less than 3/10 for 75% of his day for improved quality of life  PT Long Term Goal 1 - Progress: Met PT Long Term Goal 2: Pt will improve 5 STS w/o UE support in 10 sec PT Long Term Goal 2 - Progress: Met Long Term Goal 3: Pt will improve RLE ROM to [facility] in order to ambulate with appropriate gait mechanics independently Long Term Goal 3 Progress: Progressing toward goal Long Term Goal 4: Pt will improve LE strength to [facility] in order to ascend and descend 10 stairs with 1 handrail with reciprocal pattern Long Term Goal 4 Progress: Met PT Long Term Goal 5: Pt will improve LEFS by 9 points for improved percieved  functional ability Long Term Goal 5 Progress: Not met   PT - End of Session Activity Tolerance: Patient tolerated treatment well General Behavior During Session: [facility] for tasks performed Cognition: [facility] for tasks performed   Amy B. Bascom Levels, PTA / Rollene Rotunda. Rayette Mogg, PT, DPT   PT has read and agrees with above.   07/06/2011, 2:56 PM

## 2011-07-08 ENCOUNTER — Ambulatory Visit (HOSPITAL_COMMUNITY)
Admission: RE | Admit: 2011-07-08 | Discharge: 2011-07-08 | Disposition: A | Discharge status: Home / Self Care | Payer: Medicare Other | Source: Ambulatory Visit

## 2011-07-08 NOTE — Progress Notes (Signed)
Physical Therapy Treatment Patient Details  Name: [name] [name] MRN: [id number] Date of Birth: 1940/10/16  Today's Date: 07/08/2011 Time: 4098-1191 Time Calculation (min): 62 min Visit#: 19  of 24   Re-eval: 07/20/11  Charge: therex 62 min Manual 5 min  Subjective: Symptoms/Limitations Symptoms: Pt entered session with back brace on, said he went to MD yesterday and stated he may have strained a muscle on back pain scale 2/10 back.  Knee is feeling fine. Pain Assessment Currently in Pain?: Yes Pain Score:   2 Pain Location: Back  Objective:   Exercise/Treatments Stretches Knee: Self-Stretch to increase Flexion: 3 reps;30 seconds;Limitations Knee: Self-Stretch Limitations: Chair Stretch Aerobic Stationary Bike: 8' @ 3.0 full rotation at seat 12 Standing Heel Raises: 20 reps Heel Raises Limitations: toe raises x 20 wtih 1 upper extremity assist.   Terminal Knee Extension: 15 reps;Limitations;20 reps;Theraband Theraband Level (Terminal Knee Extension): Level 4 (Blue) Terminal Knee Extension Limitations: 10" holds ball into wall Wall Squat: 15 reps;5 seconds Supine Quad Sets: 10 reps;Limitations Quad Sets Limitations: 10" holds Heel Slides: 10 reps;Limitations Heel Slides Limitations: 10" holds prior PROM Knee Extension: PROM;Limitations Knee Extension Limitations: 3x 30" holds aggressive Knee Flexion: PROM;Limitations Knee Flexion Limitations: 3x 30" holds aggressive Prone  Prone Knee Hang: 5 minutes;Weights;Limitations Prone Knee Hang Weights (lbs): 5 Prone Knee Hang Limitations: with manual to hamstrings and joint mobs to increase extension   Physical Therapy Assessment and Plan PT Assessment and Plan Clinical Impression Statement: Pt continues to be limited in extension and flexion with antalgic gait secondary.  Able to complete all exercises correctly with no cueing required for proper technique. PT Plan: Continue progressing ROM and work towards meeting all LTG     Goals    Problem List Patient Active Problem List  Diagnoses  . HYPOTHYROIDISM  . Pure Hypercholesterolemia  . HYPERTENSION  . CORONARY ARTERY DISEASE  . ATRIAL FIBRILLATION  . PERIPHERAL VASCULAR DISEASE  . CHRONIC OBSTRUCTIVE PULMONARY DISEASE  . GERD  . DIVERTICULOSIS OF COLON  . KNEE, ARTHRITIS, DEGEN./OSTEO  . JOINT EFFUSION, KNEE  . KNEE PAIN  . OSTEOPOROSIS  . OTHER DYSPHAGIA  . NEPHROLITHIASIS, HX OF  . Hx-Major Cardiovasc Surg  . CHOLECYSTECTOMY, HX OF  . CORONARY ARTERY BYPASS GRAFT, HX OF  . Bruit  . AAA (abdominal aortic aneurysm)  . Knee pain  . Stiffness of joint, not elsewhere classified, lower leg  . Muscle weakness (generalized)  . Difficulty in walking    PT - End of Session Activity Tolerance: Patient tolerated treatment well General Behavior During Session: [facility] for tasks performed Cognition: [facility] for tasks performed  [name] 07/08/2011, 2:06 PM

## 2011-07-12 ENCOUNTER — Ambulatory Visit (HOSPITAL_COMMUNITY)
Admission: RE | Admit: 2011-07-12 | Discharge: 2011-07-12 | Disposition: A | Discharge status: Home / Self Care | Payer: Medicare Other | Source: Ambulatory Visit | Attending: Family Medicine | Admitting: Family Medicine

## 2011-07-12 NOTE — Progress Notes (Signed)
Physical Therapy Treatment Patient Details  Name: [name] [name] MRN: [id number] Date of Birth: Nov 03, 1940  Today's Date: 07/12/2011 Time: 7829-5621 Time Calculation (min): 72 min Visit#: 20  of 24   Re-eval: 07/20/11 Charges: Therex 42' Gait x 8'  Subjective: Symptoms/Limitations Symptoms: My thigh has been sore but it's not hurting right now. I took a pain pill aorund 1:30. Pain Assessment Currently in Pain?: No/denies Pain Score: 0-No pain   Exercise/Treatments Stretches Knee: Self-Stretch to increase Flexion: 2 reps;60 seconds Knee: Self-Stretch Limitations: Chair Stretch Aerobic Stationary Bike: 8' @ 3.0 full rotation at seat 12 Tread Mill: Gait training x 8 min .50 cyc/sec working on increasing stride length and heel-toe pattern Standing Functional Squat: 10 reps;Limitations Functional Squat Limitations: Squatting to lift blue ball from six inch box Seated Other Seated Knee Exercises: BIODEX PROM; flexion 100%, ext. 90% Supine Knee Extension: PROM;Limitations Knee Extension Limitations: 3x 30" holds aggressive Knee Flexion: PROM;Limitations Knee Flexion Limitations: 3x 30" holds aggressive   Modalities Modalities: Cryotherapy Cryotherapy Cryotherapy Location: Knee (Right) Type of Cryotherapy: Ice pack  Physical Therapy Assessment and Plan PT Assessment and Plan Clinical Impression Statement: Pt's L hand began to bleed during therapy from a cut he reports he had received earlier in the day. Pt reports he is on blood thinners. 2x2 was applied and hand was wrapped with 2" soft-form gauze. Bleeding subsided by end of session. Pt displays increased ROM with PROM on biodex. Pt displays increased tolerance for PROM. Pt requires frequent VC's to improve stride length and heel toe pattern. Pt displays decreased antalgia after gait training. PT Plan: Continue to progress ROM and gait per PT POC.     Problem List Patient Active Problem List  Diagnoses  . HYPOTHYROIDISM   . Pure Hypercholesterolemia  . HYPERTENSION  . CORONARY ARTERY DISEASE  . ATRIAL FIBRILLATION  . PERIPHERAL VASCULAR DISEASE  . CHRONIC OBSTRUCTIVE PULMONARY DISEASE  . GERD  . DIVERTICULOSIS OF COLON  . KNEE, ARTHRITIS, DEGEN./OSTEO  . JOINT EFFUSION, KNEE  . KNEE PAIN  . OSTEOPOROSIS  . OTHER DYSPHAGIA  . NEPHROLITHIASIS, HX OF  . Hx-Major Cardiovasc Surg  . CHOLECYSTECTOMY, HX OF  . CORONARY ARTERY BYPASS GRAFT, HX OF  . Bruit  . AAA (abdominal aortic aneurysm)  . Knee pain  . Stiffness of joint, not elsewhere classified, lower leg  . Muscle weakness (generalized)  . Difficulty in walking    PT - End of Session Activity Tolerance: Patient tolerated treatment well General Behavior During Session: [facility] for tasks performed Cognition: [facility] for tasks performed  Antonieta Iba 07/12/2011, 4:18 PM

## 2011-07-14 ENCOUNTER — Ambulatory Visit (HOSPITAL_COMMUNITY)
Admission: RE | Admit: 2011-07-14 | Discharge: 2011-07-14 | Disposition: A | Discharge status: Home / Self Care | Payer: Medicare Other | Source: Ambulatory Visit

## 2011-07-14 NOTE — Progress Notes (Signed)
Physical Therapy Treatment Patient Details  Name: [name] [name] MRN: [id number] Date of Birth: 19-Mar-1941  Today's Date: 07/14/2011 Time: 1350-1450 Time Calculation (min): 60 min Charges: 10 ice, 50TE Visit#: 21  of 24   Re-eval: 07/20/11    Subjective: Symptoms/Limitations Symptoms: "Every day is a little better"  Pain Assessment Currently in Pain?: Yes Pain Score:   2 Pain Location: Knee Pain Orientation: Right Pain Type: Surgical pain  Exercise/Treatments Stretches Knee: Self-Stretch to increase Flexion: 2 reps;60 seconds Knee: Self-Stretch Limitations: Chair Stretch Aerobic Stationary Bike: 8' @ 3.0 full rotation at seat 12 Tread Mill: Gait training x 8 min .50 cyc/sec working on increasing stride length and heel-toe pattern Standing Heel Raises: 20 reps Heel Raises Limitations: toe raises x 20 wtih 1 upper extremity assist.   Knee Flexion: 15 reps;Limitations Knee Flexion Limitations: 2# Functional Squat: 10 reps;Limitations Functional Squat Limitations: Squatting to lift blue ball from six inch box Wall Squat: 15 reps;5 seconds Supine Quad Sets: 10 reps;Limitations Quad Sets Limitations: 10" holds Heel Slides: 10 reps;Limitations Heel Slides Limitations: 10" holds prior PROM Knee Extension: PROM;Limitations Knee Extension Limitations: 3x 30" holds aggressive Prone  Prone Knee Hang: 5 minutes Other Prone Exercises: PROM 3 x 30"  Modalities Modalities: Cryotherapy Cryotherapy Number Minutes Cryotherapy: 10 Minutes Type of Cryotherapy: Ice pack  Physical Therapy Assessment and Plan Clinical Impression Statement: The patient is tolerating harder ther ex well.  He really feels the stretch in his knee with body weight exercises like functional squats and wall slides. He continues to have difficulty with gait training on the treadmill getting his step length longer (he feels like he is going to step on the front of the treadmill).   PT Plan: reciprocal stairs  in [place] next session.      Problem List Patient Active Problem List  Diagnoses  . HYPOTHYROIDISM  . Pure Hypercholesterolemia  . HYPERTENSION  . CORONARY ARTERY DISEASE  . ATRIAL FIBRILLATION  . PERIPHERAL VASCULAR DISEASE  . CHRONIC OBSTRUCTIVE PULMONARY DISEASE  . GERD  . DIVERTICULOSIS OF COLON  . KNEE, ARTHRITIS, DEGEN./OSTEO  . JOINT EFFUSION, KNEE  . KNEE PAIN  . OSTEOPOROSIS  . OTHER DYSPHAGIA  . NEPHROLITHIASIS, HX OF  . Hx-Major Cardiovasc Surg  . CHOLECYSTECTOMY, HX OF  . CORONARY ARTERY BYPASS GRAFT, HX OF  . Bruit  . AAA (abdominal aortic aneurysm)  . Knee pain  . Stiffness of joint, not elsewhere classified, lower leg  . Muscle weakness (generalized)  . Difficulty in walking   PT - End of Session Activity Tolerance: Patient tolerated treatment well General Behavior During Session: [facility] for tasks performed Cognition: [facility] for tasks performed  Clarke Amburn B. Slayton Lubitz, PT, DPT  07/14/2011, 3:12 PM

## 2011-07-19 ENCOUNTER — Ambulatory Visit (HOSPITAL_COMMUNITY)
Admission: RE | Admit: 2011-07-19 | Discharge: 2011-07-19 | Disposition: A | Discharge status: Home / Self Care | Payer: Medicare Other | Source: Ambulatory Visit | Attending: Family Medicine | Admitting: Family Medicine

## 2011-07-19 NOTE — Progress Notes (Signed)
Physical Therapy Treatment Patient Details  Name: LENELL LAMA MRN: [id number] Date of Birth: [birth year]  Today's Date: 07/19/2011 Time: 4098-1191 Time Calculation (min): 71 min Visit#: 22  of 24   Re-eval: 07/20/11  Charge: therex 63 min  Subjective: Symptoms/Limitations Symptoms: Pain free pre-medicated, doing better every day. Pain Assessment Currently in Pain?: No/denies  Objective:   Exercise/Treatments Stretches Active Hamstring Stretch: 3 reps;30 seconds;Limitations Active Hamstring Stretch Limitations: with rope Quad Stretch: 3 reps;30 seconds;Limitations Quad Stretch Limitations: with rope Knee: Self-Stretch to increase Flexion: 2 reps;60 seconds Knee: Self-Stretch Limitations: Chair Stretch Aerobic Stationary Bike: 8' @ 4.0 full rotation at seat 11 Tread Mill: 8' @ 2.0 Standing Heel Raises: 20 reps Heel Raises Limitations: toe raises x 20 wtih 1 upper extremity assist.   Knee Flexion: 15 reps;Limitations Knee Flexion Limitations: 3# TKF off 6" box Terminal Knee Extension: 15 reps;Theraband;Limitations Theraband Level (Terminal Knee Extension): Level 4 (Blue) Terminal Knee Extension Limitations: 10" holds ball into wall Lateral Step Up: 15 reps;Step Height: 6" Forward Step Up: 15 reps;Step Height: 6" Step Down: 15 reps;Step Height: 6" Functional Squat: 15 reps;Limitations Functional Squat Limitations: Squatting to lift blue ball from six inch box Wall Squat: 15 reps;5 seconds Stairs: 2FT 1 handrail with cueing for reciprocal sequence Supine Quad Sets: 10 reps;Limitations Heel Slides: 10 reps;Limitations Heel Slides Limitations: 10" holds prior PROM Knee Extension: PROM;Limitations Knee Extension Limitations: 3x 30" holds aggressive Knee Flexion: PROM;Limitations Knee Flexion Limitations: 3x 30" holds aggressive Prone  Prone Knee Hang: 5 minutes;Weights Prone Knee Hang Weights (lbs): 5 Other Prone Exercises: PROM 3 x 30"      Physical Therapy  Assessment and Plan PT Assessment and Plan Clinical Impression Statement: ROM improving, able to ride bike full recumbent on seat 11 without difficulty.  Began steps on stairwell with vc-ing for sequence for reciprocal gait ascending and escending.  Ended session on treadmill, pt stated he felt good walking following stretches reported he would ice at home. PT Plan: Reassess next session, continue progressing therex to increase ROM, continue with stairs reciprocally, agressive PROM, and end with proper gait on treadmill.    Goals    Problem List Patient Active Problem List  Diagnoses  . HYPOTHYROIDISM  . Pure Hypercholesterolemia  . HYPERTENSION  . CORONARY ARTERY DISEASE  . ATRIAL FIBRILLATION  . PERIPHERAL VASCULAR DISEASE  . CHRONIC OBSTRUCTIVE PULMONARY DISEASE  . GERD  . DIVERTICULOSIS OF COLON  . KNEE, ARTHRITIS, DEGEN./OSTEO  . JOINT EFFUSION, KNEE  . KNEE PAIN  . OSTEOPOROSIS  . OTHER DYSPHAGIA  . NEPHROLITHIASIS, HX OF  . Hx-Major Cardiovasc Surg  . CHOLECYSTECTOMY, HX OF  . CORONARY ARTERY BYPASS GRAFT, HX OF  . Bruit  . AAA (abdominal aortic aneurysm)  . Knee pain  . Stiffness of joint, not elsewhere classified, lower leg  . Muscle weakness (generalized)  . Unable to ambulate    PT - End of Session Activity Tolerance: Patient tolerated treatment well General Behavior During Session: [facility] for tasks performed Cognition: [facility] for tasks performed  [name] 07/19/2011, 2:43 PM

## 2011-07-21 ENCOUNTER — Ambulatory Visit (HOSPITAL_COMMUNITY)
Admission: RE | Admit: 2011-07-21 | Discharge: 2011-07-21 | Disposition: A | Discharge status: Home / Self Care | Payer: Medicare Other | Source: Ambulatory Visit

## 2011-07-21 NOTE — Progress Notes (Signed)
Physical Therapy Treatment Patient Details  Name: [name] [name] MRN: [id number] Date of Birth: [birth year]  Today's Date: 07/21/2011 Time: 5621-3086 Time Calculation (min): 55 min Charges: 55 TE Visit#: 23  of 27   Re-eval: 08/01/11    Subjective: Symptoms/Limitations Symptoms: Patient took a pain pill earlier today. "Dull" pain.   Pain Assessment Currently in Pain?: No/denies Pain Score: 0-No pain Pain Location: Knee Pain Orientation: Right Pain Type: Surgical pain  Exercise/Treatments Aerobic Stationary Bike: 8' @ 4.0 full rotation at seat 11 Tread Mill: 8' @ 2.0 Standing Heel Raises: 20 reps Heel Raises Limitations: toe raises x 20 wtih 1 upper extremity assist.   Knee Flexion: 15 reps;Limitations Knee Flexion Limitations: 3# through full ROM Terminal Knee Extension: 15 reps;Theraband;Limitations Theraband Level (Terminal Knee Extension): Level 4 (Blue) Lateral Step Up: 15 reps;Step Height: 6" Forward Step Up: 15 reps;Step Height: 6" Step Down: 15 reps;Step Height: 6" Functional Squat: 15 reps;Limitations Functional Squat Limitations: Squatting to lift blue ball from six inch box x 15 reps Wall Squat: 15 reps;5 seconds Stairs: 2FT 1 handrail reciprocal sequence Supine Heel Slides: 10 reps;Limitations Heel Slides Limitations: 10" holds prior PROM Knee Extension: PROM;Limitations Knee Extension Limitations: 3x 30" holds aggressive Prone  Other Prone Exercises: PROM 3 x 30"  Physical Therapy Assessment and Plan Clinical Impression Statement: Re-eval is not actually due until 08/01/11 based on when his last re-assessment was performed.   PT Plan: Add lunges, stairs in stairwell no rails, cybex leg press, and elliptical next session.      Problem List Patient Active Problem List  Diagnoses  . HYPOTHYROIDISM  . Pure Hypercholesterolemia  . HYPERTENSION  . CORONARY ARTERY DISEASE  . ATRIAL FIBRILLATION  . PERIPHERAL VASCULAR DISEASE  . CHRONIC OBSTRUCTIVE  PULMONARY DISEASE  . GERD  . DIVERTICULOSIS OF COLON  . KNEE, ARTHRITIS, DEGEN./OSTEO  . JOINT EFFUSION, KNEE  . KNEE PAIN  . OSTEOPOROSIS  . OTHER DYSPHAGIA  . NEPHROLITHIASIS, HX OF  . Hx-Major Cardiovasc Surg  . CHOLECYSTECTOMY, HX OF  . CORONARY ARTERY BYPASS GRAFT, HX OF  . Bruit  . AAA (abdominal aortic aneurysm)  . Knee pain  . Stiffness of joint, not elsewhere classified, lower leg  . Muscle weakness (generalized)  . Difficulty in walking   General Behavior During Session: [facility] for tasks performed Cognition: [facility] for tasks performed  [name] [name] [name] [name], PT, DPT  07/21/2011, 1:48 PM

## 2011-07-22 DIAGNOSIS — E785 Hyperlipidemia, unspecified: Secondary | ICD-10-CM | POA: Diagnosis not present

## 2011-07-22 DIAGNOSIS — G4700 Insomnia, unspecified: Secondary | ICD-10-CM | POA: Diagnosis not present

## 2011-07-22 DIAGNOSIS — I259 Chronic ischemic heart disease, unspecified: Secondary | ICD-10-CM | POA: Diagnosis not present

## 2011-07-26 ENCOUNTER — Ambulatory Visit (HOSPITAL_COMMUNITY)
Admission: RE | Admit: 2011-07-26 | Discharge: 2011-07-26 | Disposition: A | Discharge status: Home / Self Care | Payer: Medicare Other | Source: Ambulatory Visit | Attending: Orthopedic Surgery | Admitting: Orthopedic Surgery

## 2011-07-26 DIAGNOSIS — R262 Difficulty in walking, not elsewhere classified: Secondary | ICD-10-CM | POA: Diagnosis not present

## 2011-07-26 DIAGNOSIS — IMO0001 Reserved for inherently not codable concepts without codable children: Secondary | ICD-10-CM | POA: Insufficient documentation

## 2011-07-26 DIAGNOSIS — M25669 Stiffness of unspecified knee, not elsewhere classified: Secondary | ICD-10-CM | POA: Diagnosis not present

## 2011-07-26 DIAGNOSIS — M25569 Pain in unspecified knee: Secondary | ICD-10-CM | POA: Insufficient documentation

## 2011-07-26 DIAGNOSIS — I10 Essential (primary) hypertension: Secondary | ICD-10-CM | POA: Diagnosis not present

## 2011-07-26 NOTE — Progress Notes (Signed)
Physical Therapy Treatment Patient Details  Name: [name] [name] MRN: [id number] Date of Birth: 02-04-1941  Today's Date: 07/26/2011 Time: 4098-1191 Time Calculation (min): 65 min Visit#: 24  of 27   Re-eval: 08/01/11 Charges:  therex  65' ,icepack 10'  Subjective: Symptoms/Limitations Symptoms: Not really in any pain today, took pain meds this morning.  Sunday was really painful sitting in hospital in one spot. Pain Assessment Currently in Pain?: No/denies   Exercise/Treatments Stretches Knee: Self-Stretch to increase Flexion: 2 reps;60 seconds Knee: Self-Stretch Limitations: Chair Stretch Aerobic Elliptical: 4' @ L1 Machines for Strengthening Cybex Leg Press: 3.5 Pl B 15reps, R Standing Heel Raises: 10 reps;Limitations Heel Raises Limitations: R only Knee Flexion: 15 reps Knee Flexion Limitations: 4# through full ROM Forward Lunges: 10 reps Lateral Step Up: 15 reps;Step Height: 4" Forward Step Up: 15 reps;Step Height: 4" Step Down: 15 reps;Step Height: 4" Wall Squat: 15 reps Lunge Walking - Round Trips: 1 RT Stairs: 2FT no handrail reciprocal sequence Supine Quad Sets: 15 reps Heel Slides: 10 reps Heel Slides Limitations: prior to PROM Knee Extension: PROM;Limitations Knee Extension Limitations: 3x 30" holds aggressive Knee Flexion: PROM;Limitations Knee Flexion Limitations: 3x 30" holds aggressive    Modalities Modalities: Cryotherapy Cryotherapy Number Minutes Cryotherapy: 10 Minutes Cryotherapy Location: Knee (Right with elevation) Type of Cryotherapy: Ice pack  Physical Therapy Assessment and Plan PT Assessment and Plan Clinical Impression Statement: Progressed pt. to elliptical today, single leg heelraises and leg press without difficulty.  Pt. continues to have difficulty relaxing with PROM. PT Plan: Re-eval X 2 more visits, prior to MD appt. 08/04/11.     Problem List Patient Active Problem List  Diagnoses  . HYPOTHYROIDISM  . Pure  Hypercholesterolemia  . HYPERTENSION  . CORONARY ARTERY DISEASE  . ATRIAL FIBRILLATION  . PERIPHERAL VASCULAR DISEASE  . CHRONIC OBSTRUCTIVE PULMONARY DISEASE  . GERD  . DIVERTICULOSIS OF COLON  . KNEE, ARTHRITIS, DEGEN./OSTEO  . JOINT EFFUSION, KNEE  . KNEE PAIN  . OSTEOPOROSIS  . OTHER DYSPHAGIA  . NEPHROLITHIASIS, HX OF  . Hx-Major Cardiovasc Surg  . CHOLECYSTECTOMY, HX OF  . CORONARY ARTERY BYPASS GRAFT, HX OF  . Bruit  . AAA (abdominal aortic aneurysm)  . Knee pain  . Stiffness of joint, not elsewhere classified, lower leg  . Muscle weakness (generalized)  . Difficulty in walking    PT - End of Session Activity Tolerance: Patient tolerated treatment well General Behavior During Session: [facility] for tasks performed Cognition: [facility] for tasks performed  Amy B. Bascom Levels, PTA 07/26/2011, 4:09 PM

## 2011-07-28 ENCOUNTER — Ambulatory Visit (HOSPITAL_COMMUNITY)
Admission: RE | Admit: 2011-07-28 | Discharge: 2011-07-28 | Disposition: A | Discharge status: Home / Self Care | Payer: Medicare Other | Source: Ambulatory Visit | Attending: Family Medicine | Admitting: Family Medicine

## 2011-07-28 NOTE — Progress Notes (Signed)
Physical Therapy Treatment Patient Details  Name: [name] [name] MRN: [id number] Date of Birth: [birth year]  Today's Date: 07/28/2011 Time:13:00-13:52 52 Mins Charges: 52 TE Visit#: 25 of 27 Re-eval: 08/01/11    Subjective: A little back discomfort today wearing his back brace. Dull ache in the knee rates knee between a 1-5/10. Limitations    Exercise/Treatments Stretches Knee: Self-Stretch to increase Flexion: 2 reps;60 seconds Knee: Self-Stretch Limitations: Chair Stretch Aerobic Stationary Bike: 8' @ 4.0 full rotation at seat 10 Elliptical: 7' @ level 1 at end of session Machines for Strengthening Cybex Knee Extension: 2 PL up with both down with right 1 x 10 Cybex Knee Flexion: 4 PL bil 1 x 10 Cybex Leg Press: 3.5 Pl R 15reps Standing Lateral Step Up: 20 reps;Hand Hold: 0;Step Height: 6" Forward Step Up: Hand Hold: 0;20 reps;Step Height: 6" Step Down: 20 reps;Hand Hold: 0;Step Height: 6" Functional Squat Limitations: Squatting to lift blue ball from 4 inch box x 15 reps Wall Squat: 15 reps Lunge Walking - Round Trips: 1 RT Supine Knee Extension: PROM;Limitations Knee Extension Limitations: 3x 30" holds aggressive Prone  Prone Knee Hang: 5 minutes;Weights Prone Knee Hang Weights (lbs): 7.5# Other Prone Exercises: PROM 3 x 30"   Physical Therapy Assessment and Plan Clinical Impression Statement: The patient is tolerating harder exercises well and did well with new weight machines today. Still very painful with end ROM both flexion and extension.  PT Plan of Care: Re-eval for discharge to [facility] next session.   Problem List Patient Active Problem List  Diagnoses  . HYPOTHYROIDISM  . Pure Hypercholesterolemia  . HYPERTENSION  . CORONARY ARTERY DISEASE  . ATRIAL FIBRILLATION  . PERIPHERAL VASCULAR DISEASE  . CHRONIC OBSTRUCTIVE PULMONARY DISEASE  . GERD  . DIVERTICULOSIS OF COLON  . KNEE, ARTHRITIS, DEGEN./OSTEO  . JOINT EFFUSION, KNEE  . KNEE PAIN  .  OSTEOPOROSIS  . OTHER DYSPHAGIA  . NEPHROLITHIASIS, HX OF  . Hx-Major Cardiovasc Surg  . CHOLECYSTECTOMY, HX OF  . CORONARY ARTERY BYPASS GRAFT, HX OF  . Bruit  . AAA (abdominal aortic aneurysm)  . Knee pain  . Stiffness of joint, not elsewhere classified, lower leg  . Muscle weakness (generalized)  . Difficulty in walking    Park Beck B. Reka Wist, PT, DPT  07/28/2011, 8:23 PM

## 2011-07-31 ENCOUNTER — Other Ambulatory Visit: Payer: Self-pay | Admitting: Orthopedic Surgery

## 2011-07-31 DIAGNOSIS — M5430 Sciatica, unspecified side: Secondary | ICD-10-CM

## 2011-07-31 DIAGNOSIS — M5126 Other intervertebral disc displacement, lumbar region: Secondary | ICD-10-CM

## 2011-07-31 MED ORDER — GABAPENTIN 100 MG PO CAPS: 100.0000 mg | ORAL_CAPSULE | Freq: Three times a day (TID) | ORAL | Status: DC

## 2011-07-31 MED ORDER — PREDNISONE 10 MG PO TABS: ORAL_TABLET | ORAL | Status: DC

## 2011-07-31 NOTE — Progress Notes (Signed)
S/p right tka Dr Despina Hick  Surgery in Nov doing well  H/o DDD Lspine   C/o inability to stand straight up/ left Hip pain radiating to left knee   meds r plavix, oxycontin, synthroid, anticholesterol, bp med

## 2011-08-01 ENCOUNTER — Ambulatory Visit (HOSPITAL_COMMUNITY): Payer: Medicare Other

## 2011-08-01 DIAGNOSIS — Z471 Aftercare following joint replacement surgery: Secondary | ICD-10-CM | POA: Diagnosis not present

## 2011-08-01 DIAGNOSIS — I259 Chronic ischemic heart disease, unspecified: Secondary | ICD-10-CM | POA: Diagnosis not present

## 2011-08-01 DIAGNOSIS — E785 Hyperlipidemia, unspecified: Secondary | ICD-10-CM | POA: Diagnosis not present

## 2011-08-03 ENCOUNTER — Ambulatory Visit (HOSPITAL_COMMUNITY): Payer: Medicare Other

## 2011-08-03 ENCOUNTER — Telehealth (HOSPITAL_COMMUNITY): Payer: Self-pay

## 2011-08-04 DIAGNOSIS — M545 Low back pain: Secondary | ICD-10-CM | POA: Diagnosis not present

## 2011-08-19 DIAGNOSIS — M545 Low back pain: Secondary | ICD-10-CM | POA: Diagnosis not present

## 2011-09-05 ENCOUNTER — Encounter: Payer: Self-pay | Admitting: Cardiovascular Disease

## 2011-09-05 DIAGNOSIS — Z79899 Other long term (current) drug therapy: Secondary | ICD-10-CM | POA: Diagnosis not present

## 2011-09-05 DIAGNOSIS — E039 Hypothyroidism, unspecified: Secondary | ICD-10-CM | POA: Diagnosis not present

## 2011-09-05 DIAGNOSIS — E782 Mixed hyperlipidemia: Secondary | ICD-10-CM | POA: Diagnosis not present

## 2011-09-13 DIAGNOSIS — M545 Low back pain: Secondary | ICD-10-CM | POA: Diagnosis not present

## 2011-09-15 ENCOUNTER — Telehealth: Payer: Self-pay | Admitting: *Deleted

## 2011-09-15 NOTE — Telephone Encounter (Signed)
SPOKE WITH PT IS OVERDUE FOR RENAL AND ABD [place]  AS WELL AS FOLLOW UP WITH DR [name]  WILL CALL BACK LATER AND SCHEDULE ABOVE LISTED APPTS AFTER  HAS INJECTION DONE IN APRIL FOR BACK./CY

## 2011-09-27 ENCOUNTER — Telehealth: Payer: Self-pay | Admitting: Cardiovascular Disease

## 2011-09-27 NOTE — Telephone Encounter (Signed)
PT AWARE NO CRESTOR SAMPLES AVAILABLE .Zack Seal

## 2011-09-27 NOTE — Telephone Encounter (Signed)
Please request return call at [phone], regarding Crestor Samples.

## 2011-09-28 DIAGNOSIS — M545 Low back pain: Secondary | ICD-10-CM | POA: Diagnosis not present

## 2011-10-27 ENCOUNTER — Other Ambulatory Visit: Payer: Self-pay | Admitting: Cardiovascular Disease

## 2011-10-27 MED ORDER — ROSUVASTATIN CALCIUM 20 MG PO TABS: 20.0000 mg | ORAL_TABLET | Freq: Every day | ORAL | Status: DC

## 2011-11-07 ENCOUNTER — Telehealth: Payer: Self-pay | Admitting: Cardiovascular Disease

## 2011-11-07 ENCOUNTER — Encounter (INDEPENDENT_AMBULATORY_CARE_PROVIDER_SITE_OTHER): Payer: Medicare Other

## 2011-11-07 DIAGNOSIS — I701 Atherosclerosis of renal artery: Secondary | ICD-10-CM | POA: Diagnosis not present

## 2011-11-07 DIAGNOSIS — I10 Essential (primary) hypertension: Secondary | ICD-10-CM

## 2011-11-07 NOTE — Telephone Encounter (Signed)
Walk in pt Form " pt Dropped off Labs for [facility]" sent to [place] 11/07/11/KM

## 2011-12-01 ENCOUNTER — Ambulatory Visit (INDEPENDENT_AMBULATORY_CARE_PROVIDER_SITE_OTHER): Payer: Medicare Other | Admitting: Cardiovascular Disease

## 2011-12-01 ENCOUNTER — Encounter: Payer: Self-pay | Admitting: Cardiovascular Disease

## 2011-12-01 VITALS — BP 154/75 | HR 57 | Ht 68.0 in | Wt 168.0 lb

## 2011-12-01 DIAGNOSIS — I2510 Atherosclerotic heart disease of native coronary artery without angina pectoris: Secondary | ICD-10-CM | POA: Diagnosis not present

## 2011-12-01 DIAGNOSIS — I10 Essential (primary) hypertension: Secondary | ICD-10-CM

## 2011-12-01 DIAGNOSIS — R0989 Other specified symptoms and signs involving the circulatory and respiratory systems: Secondary | ICD-10-CM | POA: Diagnosis not present

## 2011-12-01 DIAGNOSIS — I739 Peripheral vascular disease, unspecified: Secondary | ICD-10-CM | POA: Diagnosis not present

## 2011-12-01 DIAGNOSIS — I4891 Unspecified atrial fibrillation: Secondary | ICD-10-CM

## 2011-12-01 DIAGNOSIS — M25569 Pain in unspecified knee: Secondary | ICD-10-CM

## 2011-12-01 NOTE — Progress Notes (Signed)
Patient ID: [name] [name], male   DOB: 1941/02/16, 71 y.o.   MRN: [id number] [name] [name] is a vasculopath with history of AAA repair, CABG, left subclavian stent and right CEA. he has seen Dr. Hart Rochester for surveillance of his AAA. He prefers to have his carotids and subclavian followed up here. I reviewed his from 12/11 Abdominal duplex and his Aoritic and renal grafts are fine. Reviewed his carotid which is also stable with wide open right CEA and 40-59% LICA needing F/U duplex this month  He has not had any significant TIAs her left upper extremity arm weakness. Had a previous CABG. His last catheter was done 310 2008. His LIMA to the LAD was patent with a patent vein graft to the PDA and no significant disease in his circumflex. His LV function was normal. He denies SSCP and has chronic mild exertional dyspnea from lung disease.  Reviewed echo from 6/12 . Has significant SEM. NO AS suprisingly. Normal EF and only mild MR   Had his right knee replaced and has some chronic back pain he needs to F/U with Dr Shelle Iron.  Has had injection by [facility]  ROS: Denies fever, malais, weight loss, blurry vision, decreased visual acuity, cough, sputum, SOB, hemoptysis, pleuritic pain, palpitaitons, heartburn, abdominal pain, melena, lower extremity edema, claudication, or rash.  All other systems reviewed and negative  General: Affect appropriate Chronically ill male HEENT: normal Neck supple with no adenopathy JVP normal bilateral bruits no thyromegaly Lungs clear with no wheezing and good diaphragmatic motion Heart:  S1/S2 systolic  murmur, no rub, gallop or click PMI normal Abdomen: benighn, BS positve, no tenderness, no AAA repair with ventral hernia  no bruit.  No HSM or HJR Distal pulses intact with no bruits No edema Neuro non-focal Skin warm and dry No muscular weakness   Current Outpatient Prescriptions  Medication Sig Dispense Refill  . aspirin 325 MG tablet Take 325 mg by mouth daily.      .  clopidogrel (PLAVIX) 75 MG tablet TAKE ONE (1) TABLET BY MOUTH EVERY      DAY  30 tablet  12  . Coenzyme Q10 (CO Q 10 PO) Take 1 tablet by mouth daily.      [name] gabapentin (NEURONTIN) 300 MG capsule Take 300 mg by mouth daily.      [name] KRILL OIL PO Take 1 tablet by mouth daily.      . lansoprazole (PREVACID) 15 MG capsule Take 15 mg by mouth daily.       [name] levothyroxine (SYNTHROID, LEVOTHROID) 125 MCG tablet Take 125 mcg by mouth daily.      . methocarbamol (ROBAXIN) 500 MG tablet Take 500 mg by mouth 3 (three) times daily.      . metoprolol (LOPRESSOR) 50 MG tablet 1 1/2 TAB PO BID      . niacin 500 MG tablet 1500 MG DAILY      . nitroGLYCERIN (NITROSTAT) 0.4 MG SL tablet Place 1 tablet (0.4 mg total) under the tongue every 5 (five) minutes as needed for chest pain.  25 tablet  10  . rosuvastatin (CRESTOR) 20 MG tablet Take 1 tablet (20 mg total) by mouth daily.  30 tablet  1  . Tamsulosin HCl (FLOMAX) 0.4 MG CAPS Take 1 capsule (0.4 mg total) by mouth daily after supper.  14 capsule  0  . triamterene-hydrochlorothiazide (MAXZIDE-25) 37.5-25 MG per tablet Take 1 tablet by mouth daily.        Allergies  Review of patient's  allergies indicates no known allergies.  Electrocardiogram: 04/21/11  NSR rate 44 LVH with nonspecific ST/T wave changes  Assessment and Plan

## 2011-12-01 NOTE — Assessment & Plan Note (Signed)
Maint NSR with no palpitations.  Observe

## 2011-12-01 NOTE — Assessment & Plan Note (Signed)
F/U [facility]  Continue rehab.  ROM is good

## 2011-12-01 NOTE — Assessment & Plan Note (Signed)
Stable activity limited by back pain at this point.  [place] AAA/bifem in a year

## 2011-12-01 NOTE — Assessment & Plan Note (Signed)
Well controlled.  Continue current medications and low sodium Dash type diet.   No RAS by duplex

## 2011-12-01 NOTE — Patient Instructions (Signed)
Your physician wants you to follow-up in: 6 MONTHS WITH DR Haywood Filler will receive a reminder letter in the mail two months in advance. If you don't receive a letter, please call our office to schedule the follow-up appointment. Your physician recommends that you continue on your current medications as directed. Please refer to the Current Medication list given to you today. Your physician has requested that you have a carotid duplex. This test is an ultrasound of the carotid arteries in your neck. It looks at blood flow through these arteries that supply the brain with blood. Allow one hour for this exam. There are no restrictions or special instructions. DX BRUIT IN  THE NEXT FEW WEEKS

## 2011-12-01 NOTE — Assessment & Plan Note (Signed)
Previous CEA with known moderate disease.  ASA  F/U duplex this month

## 2011-12-01 NOTE — Assessment & Plan Note (Signed)
Stable with no angina and good activity level.  Continue medical Rx  

## 2011-12-23 ENCOUNTER — Other Ambulatory Visit: Payer: Self-pay | Admitting: *Deleted

## 2011-12-23 DIAGNOSIS — R0989 Other specified symptoms and signs involving the circulatory and respiratory systems: Secondary | ICD-10-CM

## 2011-12-27 ENCOUNTER — Encounter (INDEPENDENT_AMBULATORY_CARE_PROVIDER_SITE_OTHER): Payer: Medicare Other

## 2011-12-27 DIAGNOSIS — R0989 Other specified symptoms and signs involving the circulatory and respiratory systems: Secondary | ICD-10-CM

## 2011-12-27 DIAGNOSIS — I6529 Occlusion and stenosis of unspecified carotid artery: Secondary | ICD-10-CM

## 2012-01-03 ENCOUNTER — Telehealth: Payer: Self-pay | Admitting: Cardiovascular Disease

## 2012-01-03 NOTE — Telephone Encounter (Signed)
PT AWARE OF CAROTID RESULTS ./CY 

## 2012-01-03 NOTE — Telephone Encounter (Signed)
PT RTN CALL TO CHRISTINE, PLS CALL

## 2012-01-20 ENCOUNTER — Other Ambulatory Visit: Payer: Self-pay | Admitting: Cardiovascular Disease

## 2012-02-10 ENCOUNTER — Other Ambulatory Visit: Payer: Self-pay | Admitting: Cardiovascular Disease

## 2012-03-12 DIAGNOSIS — Z23 Encounter for immunization: Secondary | ICD-10-CM | POA: Diagnosis not present

## 2012-04-05 ENCOUNTER — Telehealth: Payer: Self-pay | Admitting: *Deleted

## 2012-04-05 NOTE — Telephone Encounter (Signed)
SPOKE WITH PT. PT CALLED COMPLAINING  OF HEAD POUNDING  HAS NOTED FOR ABOUT  A MONTH  PER PT CAN PRESS CAROTID  ARTERY ON LEFT SIDE AND  THE POUNDING STOPS IS DUE FOR REPEAT CAROTID IN JAN  AND HAS  F/U WITH DR [name] IN December WILL FORWARD TO DR [name] FOR REVIEW .Zack Seal

## 2012-04-05 NOTE — Telephone Encounter (Signed)
Pounding should not be related to stenosis  I dont mind if he has duplex early

## 2012-04-10 NOTE — Telephone Encounter (Signed)
PT AWARE  MAY TRY TO DO DUPLEX EARLIER PER PT WOULD RATHER WAIT AND SEE  IF CONTINUES WITH SAME SYMPTOMS.PT  DOES NOT KNOW IF INSURANCE WILL COVER IF DONE EARLIER. SUGGESTED TO PT TO CALL PMD  TO BE EVALUATED   PT AGREES./CY

## 2012-04-16 ENCOUNTER — Encounter: Payer: Self-pay | Admitting: Cardiovascular Disease

## 2012-05-29 ENCOUNTER — Ambulatory Visit (INDEPENDENT_AMBULATORY_CARE_PROVIDER_SITE_OTHER): Payer: Medicare Other | Admitting: Cardiovascular Disease

## 2012-05-29 ENCOUNTER — Encounter: Payer: Self-pay | Admitting: Cardiovascular Disease

## 2012-05-29 VITALS — BP 136/69 | HR 48 | Resp 18 | Ht 67.0 in | Wt 173.0 lb

## 2012-05-29 DIAGNOSIS — I10 Essential (primary) hypertension: Secondary | ICD-10-CM

## 2012-05-29 DIAGNOSIS — R0989 Other specified symptoms and signs involving the circulatory and respiratory systems: Secondary | ICD-10-CM | POA: Diagnosis not present

## 2012-05-29 DIAGNOSIS — I25810 Atherosclerosis of coronary artery bypass graft(s) without angina pectoris: Secondary | ICD-10-CM | POA: Diagnosis not present

## 2012-05-29 DIAGNOSIS — I714 Abdominal aortic aneurysm, without rupture: Secondary | ICD-10-CM

## 2012-05-29 DIAGNOSIS — I2510 Atherosclerotic heart disease of native coronary artery without angina pectoris: Secondary | ICD-10-CM | POA: Diagnosis not present

## 2012-05-29 NOTE — Assessment & Plan Note (Signed)
Cholesterol is at goal.  Continue current dose of statin and diet Rx.  No myalgias or side effects.  F/U  LFT's in 6 months. Lab Results  Component Value Date   LDLCALC 48 03/29/2011   Samples of crestor given

## 2012-05-29 NOTE — Assessment & Plan Note (Signed)
Well controlled.  Continue current medications and low sodium Dash type diet.   No RAS by duplex last year

## 2012-05-29 NOTE — Patient Instructions (Addendum)
Your physician wants you to follow-up in: 6 months with dr Haywood Filler will receive a reminder letter in the mail two months in advance. If you don't receive a letter, please call our office to schedule the follow-up appointment. Your physician recommends that you continue on your current medications as directed. Please refer to the Current Medication list given to you today. Your physician has requested that you have a carotid duplex. This test is an ultrasound of the carotid arteries in your neck. It looks at blood flow through these arteries that supply the brain with blood. Allow one hour for this exam. There are no restrictions or special instructions. DUE IN  January 2014

## 2012-05-29 NOTE — Assessment & Plan Note (Signed)
Stable with no angina and good activity level.  Continue medical Rx  

## 2012-05-29 NOTE — Progress Notes (Signed)
Patient ID: [name] [name], male   DOB: April 14, 1941, 71 y.o.   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 he has seen Dr. Hart Rochester for surveillance of his AAA. He prefers to have his carotids and subclavian followed up here. I reviewed his from 12/11 Abdominal duplex and his Aoritic and renal grafts are fine. Reviewed his carotid which is also stable with wide open right CEA and 40-59% LICA needing F/U duplex this month He has not had any significant TIAs her left upper extremity arm weakness. Had a previous CABG. His last catheter was done 310 2008. His LIMA to the LAD was patent with a patent vein graft to the PDA and no significant disease in his circumflex. His LV function was normal. He denies SSCP and has chronic mild exertional dyspnea from lung disease.  Reviewed echo from 6/12 . Has significant SEM. NO AS suprisingly. Normal EF and only mild MR  Had his right knee replaced and has some chronic back pain he needs to F/U with Dr Shelle Iron. Has had injection by [facility]  Duplex 12/27/11  60-79% LICA  Feels some thumping in his head on leaft side  ROS: Denies fever, malais, weight loss, blurry vision, decreased visual acuity, cough, sputum, SOB, hemoptysis, pleuritic pain, palpitaitons, heartburn, abdominal pain, melena, lower extremity edema, claudication, or rash.  All other systems reviewed and negative  General: Affect appropriate Chronically ill male HEENT: normal Neck supple with no adenopathy JVP normal bilateral carotid and subclavian bruits no thyromegaly Lungs clear with no wheezing and good diaphragmatic motion Heart:  S1/S2 no murmur, no rub, gallop or click PMI normal Abdomen: benighn, BS positve, no tenderness, AAA scar no bruit.  No HSM or HJR Distal pulses intact with no bruits No edema Neuro non-focal Skin warm and dry No muscular weakness   Current Outpatient Prescriptions  Medication Sig Dispense Refill  .  aspirin 325 MG tablet Take 325 mg by mouth daily.      . clopidogrel (PLAVIX) 75 MG tablet TAKE ONE (1) TABLET BY MOUTH EVERY      DAY  30 tablet  12  . Coenzyme Q10 (CO Q 10 PO) Take 1 tablet by mouth daily.      [name] KRILL OIL PO Take 1 tablet by mouth daily.      . lansoprazole (PREVACID) 15 MG capsule Take 15 mg by mouth daily.       [name] levothyroxine (SYNTHROID, LEVOTHROID) 125 MCG tablet Take 125 mcg by mouth daily.      . metoprolol (LOPRESSOR) 50 MG tablet 1 1/2 TAB PO BID      . niacin 500 MG tablet 1500 MG DAILY      . rosuvastatin (CRESTOR) 20 MG tablet Take 1 tablet (20 mg total) by mouth daily.  30 tablet  1  . triamterene-hydrochlorothiazide (MAXZIDE-25) 37.5-25 MG per tablet TAKE ONE TABLET EVERY MORNING  30 tablet  12  . gabapentin (NEURONTIN) 300 MG capsule Take 300 mg by mouth daily.      . methocarbamol (ROBAXIN) 500 MG tablet Take 500 mg by mouth 3 (three) times daily.      . nitroGLYCERIN (NITROSTAT) 0.4 MG SL tablet Place 1 tablet (0.4 mg total) under the tongue every 5 (five) minutes as needed for chest pain.  25 tablet  10  . Tamsulosin HCl (FLOMAX) 0.4 MG CAPS Take 1 capsule (0.4 mg total) by mouth daily after supper.  14 capsule  0    Allergies  Review of patient's allergies indicates no known allergies.  Electrocardiogram:  NSR rate 48 RBBB lateral T wave changes  Assessment and Plan

## 2012-05-29 NOTE — Assessment & Plan Note (Signed)
No palpable mass or residual AAA by duplex  Follow

## 2012-05-29 NOTE — Assessment & Plan Note (Signed)
Preveious left subclavian stent with good BP in left arm.  60-79% LICA stenosis.  Palpable subclavian on left ? Post stenotic dilatation F/U duplex in January

## 2012-06-26 ENCOUNTER — Other Ambulatory Visit: Payer: Self-pay | Admitting: Cardiovascular Disease

## 2012-06-26 MED ORDER — METOPROLOL TARTRATE 50 MG PO TABS: 75.0000 mg | ORAL_TABLET | Freq: Two times a day (BID) | ORAL | Status: DC

## 2012-06-26 MED ORDER — CLOPIDOGREL BISULFATE 75 MG PO TABS: 75.0000 mg | ORAL_TABLET | Freq: Every day | ORAL | Status: DC

## 2012-07-03 ENCOUNTER — Encounter (INDEPENDENT_AMBULATORY_CARE_PROVIDER_SITE_OTHER): Payer: Medicare Other

## 2012-07-03 DIAGNOSIS — I6529 Occlusion and stenosis of unspecified carotid artery: Secondary | ICD-10-CM | POA: Diagnosis not present

## 2012-07-03 DIAGNOSIS — R0989 Other specified symptoms and signs involving the circulatory and respiratory systems: Secondary | ICD-10-CM

## 2012-07-30 ENCOUNTER — Telehealth: Payer: Self-pay | Admitting: *Deleted

## 2012-07-30 NOTE — Telephone Encounter (Signed)
Spoke with pt, aware we do not have any crestor 20 mg at this time.

## 2012-07-30 NOTE — Telephone Encounter (Signed)
Pt calling for [facility]. He would like to know if we have any Crestor 20 mg samples

## 2012-08-15 DIAGNOSIS — Z0000 Encounter for general adult medical examination without abnormal findings: Secondary | ICD-10-CM | POA: Diagnosis not present

## 2012-08-15 DIAGNOSIS — E782 Mixed hyperlipidemia: Secondary | ICD-10-CM | POA: Diagnosis not present

## 2012-08-15 DIAGNOSIS — R5381 Other malaise: Secondary | ICD-10-CM | POA: Diagnosis not present

## 2012-08-15 DIAGNOSIS — Z125 Encounter for screening for malignant neoplasm of prostate: Secondary | ICD-10-CM | POA: Diagnosis not present

## 2012-08-15 DIAGNOSIS — Z79899 Other long term (current) drug therapy: Secondary | ICD-10-CM | POA: Diagnosis not present

## 2012-08-15 DIAGNOSIS — E785 Hyperlipidemia, unspecified: Secondary | ICD-10-CM | POA: Diagnosis not present

## 2012-08-15 DIAGNOSIS — R5383 Other fatigue: Secondary | ICD-10-CM | POA: Diagnosis not present

## 2012-08-15 DIAGNOSIS — I259 Chronic ischemic heart disease, unspecified: Secondary | ICD-10-CM | POA: Diagnosis not present

## 2012-08-15 DIAGNOSIS — E039 Hypothyroidism, unspecified: Secondary | ICD-10-CM | POA: Diagnosis not present

## 2012-08-28 DIAGNOSIS — E739 Lactose intolerance, unspecified: Secondary | ICD-10-CM | POA: Diagnosis not present

## 2012-10-20 ENCOUNTER — Encounter: Payer: Self-pay | Admitting: *Deleted

## 2012-12-06 ENCOUNTER — Ambulatory Visit (INDEPENDENT_AMBULATORY_CARE_PROVIDER_SITE_OTHER): Payer: Medicare Other | Admitting: Cardiovascular Disease

## 2012-12-06 ENCOUNTER — Encounter: Payer: Self-pay | Admitting: Cardiovascular Disease

## 2012-12-06 VITALS — BP 144/72 | HR 66 | Ht 67.0 in | Wt 163.0 lb

## 2012-12-06 DIAGNOSIS — I10 Essential (primary) hypertension: Secondary | ICD-10-CM | POA: Diagnosis not present

## 2012-12-06 DIAGNOSIS — R0989 Other specified symptoms and signs involving the circulatory and respiratory systems: Secondary | ICD-10-CM | POA: Diagnosis not present

## 2012-12-06 DIAGNOSIS — Z951 Presence of aortocoronary bypass graft: Secondary | ICD-10-CM

## 2012-12-06 DIAGNOSIS — E7800 Pure hypercholesterolemia, unspecified: Secondary | ICD-10-CM

## 2012-12-06 NOTE — Progress Notes (Signed)
Patient ID: [name] [name], male   DOB: 1941-04-27, 72 y.o.   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 he has seen Dr. Hart Rochester for surveillance of his AAA. He prefers to have his carotids and subclavian followed up here. I reviewed his from 12/11 Abdominal duplex and his Aoritic and renal grafts are fine. Reviewed his carotid which is also stable with wide open right CEA and 40-59% LICA needing F/U duplex this month He has not had any significant TIAs her left upper extremity arm weakness. Had a previous CABG. His last catheter was done 310 2008. His LIMA to the LAD was patent with a patent vein graft to the PDA and no significant disease in his circumflex. His LV function was normal. He denies SSCP and has chronic mild exertional dyspnea from lung disease.  Reviewed echo from 6/12 . Has significant SEM. NO AS suprisingly. Normal EF and only mild MR  Had his right knee replaced and has some chronic back pain he needs to F/U with Dr Shelle Iron. Has had injection by Ramos  Duplex 1/14 reviewed  60-79% LICA    ROS: Denies fever, malais, weight loss, blurry vision, decreased visual acuity, cough, sputum, SOB, hemoptysis, pleuritic pain, palpitaitons, heartburn, abdominal pain, melena, lower extremity edema, claudication, or rash.  All other systems reviewed and negative  General: Affect appropriate Chronically ill male HEENT: normal Neck supple with no adenopathy JVP normal bilateral  bruits no thyromegaly Lungs clear with no wheezing and good diaphragmatic motion Heart:  S1/S2 SEM murmur, no rub, gallop or click PMI normal Abdomen: benighn, BS positve, no tenderness, AAA with ventral hernia no bruit.  No HSM or HJR Distal pulses intact with no bruits No edema Neuro non-focal Skin warm and dry No muscular weakness   Current Outpatient Prescriptions  Medication Sig Dispense Refill  . aspirin 325 MG tablet Take 325 mg by mouth  daily.      . clopidogrel (PLAVIX) 75 MG tablet Take 1 tablet (75 mg total) by mouth daily.  30 tablet  5  . Coenzyme Q10 (CO Q 10 PO) Take 1 tablet by mouth daily.      [name] KRILL OIL PO Take 1 tablet by mouth daily.      . lansoprazole (PREVACID) 15 MG capsule Take 15 mg by mouth daily.       [name] levothyroxine (SYNTHROID, LEVOTHROID) 125 MCG tablet Take 125 mcg by mouth daily.      . metoprolol (LOPRESSOR) 50 MG tablet Take 1.5 tablets (75 mg total) by mouth 2 (two) times daily.  90 tablet  5  . niacin 500 MG tablet 2000 MG DAILY      . nitroGLYCERIN (NITROSTAT) 0.4 MG SL tablet Place 1 tablet (0.4 mg total) under the tongue every 5 (five) minutes as needed for chest pain.  25 tablet  10  . rosuvastatin (CRESTOR) 20 MG tablet Take 1 tablet (20 mg total) by mouth daily.  30 tablet  1  . triamterene-hydrochlorothiazide (MAXZIDE-25) 37.5-25 MG per tablet TAKE ONE TABLET EVERY MORNING  30 tablet  12   No current facility-administered medications for this visit.    Allergies  Imitrex and Lipitor  Electrocardiogram:  05/29/12 SB rate 48 RBBB  Assessment and Plan

## 2012-12-06 NOTE — Assessment & Plan Note (Signed)
F/U duplex this month.  S/P RCEA with known 60-79% L ICA stenosis

## 2012-12-06 NOTE — Assessment & Plan Note (Signed)
Cholesterol is at goal.  Continue current dose of statin and diet Rx.  No myalgias or side effects.  F/U  LFT's in 6 months. Lab Results  Component Value Date   LDLCALC 48 03/29/2011  Labs with Dr Gerda Diss

## 2012-12-06 NOTE — Patient Instructions (Signed)
Your physician wants you to follow-up in:    6 MONTHS WITH DR  NISHAN  You will receive a reminder letter in the mail two months in advance. If you don't receive a letter, please call our office to schedule the follow-up appointment.  Your physician recommends that you continue on your current medications as directed. Please refer to the Current Medication list given to you today.   Your physician has requested that you have a carotid duplex. This test is an ultrasound of the carotid arteries in your neck. It looks at blood flow through these arteries that supply the brain with blood. Allow one hour for this exam. There are no restrictions or special instructions.   

## 2012-12-06 NOTE — Assessment & Plan Note (Signed)
Well controlled.  Continue current medications and low sodium Dash type diet.    

## 2012-12-06 NOTE — Assessment & Plan Note (Signed)
Stable with no angina and good activity level.  Continue medical Rx  

## 2012-12-25 ENCOUNTER — Other Ambulatory Visit: Payer: Self-pay | Admitting: *Deleted

## 2012-12-25 MED ORDER — METOPROLOL TARTRATE 50 MG PO TABS: 75.0000 mg | ORAL_TABLET | Freq: Two times a day (BID) | ORAL | Status: DC

## 2012-12-25 NOTE — Telephone Encounter (Signed)
[name] [name] from the pharmacy called for this refill,  i filled it to dec when pt is to return for his 6 month check up.

## 2013-01-01 ENCOUNTER — Other Ambulatory Visit: Payer: Self-pay | Admitting: *Deleted

## 2013-01-01 MED ORDER — CLOPIDOGREL BISULFATE 75 MG PO TABS: 75.0000 mg | ORAL_TABLET | Freq: Every day | ORAL | Status: DC

## 2013-01-07 ENCOUNTER — Ambulatory Visit (INDEPENDENT_AMBULATORY_CARE_PROVIDER_SITE_OTHER): Payer: Medicare Other | Admitting: Family Medicine

## 2013-01-07 ENCOUNTER — Encounter: Payer: Self-pay | Admitting: Family Medicine

## 2013-01-07 VITALS — BP 132/68 | HR 70 | Wt 162.0 lb

## 2013-01-07 DIAGNOSIS — E039 Hypothyroidism, unspecified: Secondary | ICD-10-CM

## 2013-01-07 DIAGNOSIS — E119 Type 2 diabetes mellitus without complications: Secondary | ICD-10-CM | POA: Diagnosis not present

## 2013-01-07 DIAGNOSIS — I2510 Atherosclerotic heart disease of native coronary artery without angina pectoris: Secondary | ICD-10-CM

## 2013-01-07 DIAGNOSIS — E7800 Pure hypercholesterolemia, unspecified: Secondary | ICD-10-CM

## 2013-01-07 DIAGNOSIS — E782 Mixed hyperlipidemia: Secondary | ICD-10-CM | POA: Diagnosis not present

## 2013-01-07 DIAGNOSIS — Z79899 Other long term (current) drug therapy: Secondary | ICD-10-CM | POA: Diagnosis not present

## 2013-01-07 DIAGNOSIS — I10 Essential (primary) hypertension: Secondary | ICD-10-CM

## 2013-01-07 DIAGNOSIS — I4891 Unspecified atrial fibrillation: Secondary | ICD-10-CM

## 2013-01-07 DIAGNOSIS — J449 Chronic obstructive pulmonary disease, unspecified: Secondary | ICD-10-CM

## 2013-01-07 LAB — POCT GLYCOSYLATED HEMOGLOBIN (HGB A1C): Hemoglobin A1C: 5.8

## 2013-01-07 NOTE — Progress Notes (Signed)
  Subjective:    Patient ID: [name] [name], male    DOB: 08-19-1940, 72 y.o.   MRN: [id number]  HPI  Results for orders placed in visit on 01/07/13  POCT GLYCOSYLATED HEMOGLOBIN (HGB A1C)      Result Value Range   Hemoglobin A1C 5.8     patient states trying to watch his sugar intake. Occasionally feels mostly following his diet. Walking quite a bit. Has not checked his sugar recently.  Compliant with medications for blood pressure. Has cut down his salt intake. Walking.  No chest pain with his history of coronary artery disease. No rapid heart rate with a history of A. fib.  Patient does have intermittent arthritis pain particularly in the knees and back. Fairly good control with over-the-counter meds.  Patient's compliant with his lipid medication. No blood work for half year. Review of Systems No abdominal pain no vomiting no chest pain no nausea no diaphoresis ROS otherwise negative    Objective:   Physical Exam Alert HEENT normal. Lungs clear. Heart rhythm is controlled and today regular. Ankles without edema.       Assessment & Plan:  #1 impaired fasting glucose control still good and still non-diabetic. #2 hypertension good control. #3 hyperlipidemia status uncertain. #4 A. fib clinically stable. Plan appropriate blood work. Diet exercise discussed. Maintain same medications. WSL

## 2013-01-09 ENCOUNTER — Other Ambulatory Visit: Payer: Self-pay | Admitting: Family Medicine

## 2013-01-09 ENCOUNTER — Encounter (INDEPENDENT_AMBULATORY_CARE_PROVIDER_SITE_OTHER): Payer: Medicare Other

## 2013-01-09 DIAGNOSIS — I6529 Occlusion and stenosis of unspecified carotid artery: Secondary | ICD-10-CM | POA: Diagnosis not present

## 2013-01-09 DIAGNOSIS — R0989 Other specified symptoms and signs involving the circulatory and respiratory systems: Secondary | ICD-10-CM

## 2013-01-09 DIAGNOSIS — Z79899 Other long term (current) drug therapy: Secondary | ICD-10-CM | POA: Diagnosis not present

## 2013-01-09 DIAGNOSIS — E782 Mixed hyperlipidemia: Secondary | ICD-10-CM | POA: Diagnosis not present

## 2013-01-10 LAB — HEPATIC FUNCTION PANEL
AST: 22 U/L (ref 0–37)
Albumin: 4.2 g/dL (ref 3.5–5.2)
Total Bilirubin: 0.8 mg/dL (ref 0.3–1.2)

## 2013-01-10 LAB — LIPID PANEL
HDL: 38 mg/dL — ABNORMAL LOW (ref >39)
Total CHOL/HDL Ratio: 2.7 Ratio
VLDL: 20 mg/dL (ref 0–40)

## 2013-01-23 DIAGNOSIS — M999 Biomechanical lesion, unspecified: Secondary | ICD-10-CM | POA: Diagnosis not present

## 2013-01-23 DIAGNOSIS — M9981 Other biomechanical lesions of cervical region: Secondary | ICD-10-CM | POA: Diagnosis not present

## 2013-01-23 DIAGNOSIS — M546 Pain in thoracic spine: Secondary | ICD-10-CM | POA: Diagnosis not present

## 2013-01-23 DIAGNOSIS — M531 Cervicobrachial syndrome: Secondary | ICD-10-CM | POA: Diagnosis not present

## 2013-01-25 DIAGNOSIS — M999 Biomechanical lesion, unspecified: Secondary | ICD-10-CM | POA: Diagnosis not present

## 2013-01-25 DIAGNOSIS — M546 Pain in thoracic spine: Secondary | ICD-10-CM | POA: Diagnosis not present

## 2013-01-25 DIAGNOSIS — M531 Cervicobrachial syndrome: Secondary | ICD-10-CM | POA: Diagnosis not present

## 2013-01-25 DIAGNOSIS — M9981 Other biomechanical lesions of cervical region: Secondary | ICD-10-CM | POA: Diagnosis not present

## 2013-01-28 DIAGNOSIS — M999 Biomechanical lesion, unspecified: Secondary | ICD-10-CM | POA: Diagnosis not present

## 2013-01-28 DIAGNOSIS — M546 Pain in thoracic spine: Secondary | ICD-10-CM | POA: Diagnosis not present

## 2013-01-28 DIAGNOSIS — M531 Cervicobrachial syndrome: Secondary | ICD-10-CM | POA: Diagnosis not present

## 2013-01-28 DIAGNOSIS — M9981 Other biomechanical lesions of cervical region: Secondary | ICD-10-CM | POA: Diagnosis not present

## 2013-01-30 DIAGNOSIS — M531 Cervicobrachial syndrome: Secondary | ICD-10-CM | POA: Diagnosis not present

## 2013-01-30 DIAGNOSIS — M546 Pain in thoracic spine: Secondary | ICD-10-CM | POA: Diagnosis not present

## 2013-01-30 DIAGNOSIS — M999 Biomechanical lesion, unspecified: Secondary | ICD-10-CM | POA: Diagnosis not present

## 2013-01-30 DIAGNOSIS — M9981 Other biomechanical lesions of cervical region: Secondary | ICD-10-CM | POA: Diagnosis not present

## 2013-02-01 DIAGNOSIS — M9981 Other biomechanical lesions of cervical region: Secondary | ICD-10-CM | POA: Diagnosis not present

## 2013-02-01 DIAGNOSIS — M999 Biomechanical lesion, unspecified: Secondary | ICD-10-CM | POA: Diagnosis not present

## 2013-02-01 DIAGNOSIS — M546 Pain in thoracic spine: Secondary | ICD-10-CM | POA: Diagnosis not present

## 2013-02-01 DIAGNOSIS — M531 Cervicobrachial syndrome: Secondary | ICD-10-CM | POA: Diagnosis not present

## 2013-02-06 DIAGNOSIS — M9981 Other biomechanical lesions of cervical region: Secondary | ICD-10-CM | POA: Diagnosis not present

## 2013-02-06 DIAGNOSIS — M999 Biomechanical lesion, unspecified: Secondary | ICD-10-CM | POA: Diagnosis not present

## 2013-02-06 DIAGNOSIS — M531 Cervicobrachial syndrome: Secondary | ICD-10-CM | POA: Diagnosis not present

## 2013-02-06 DIAGNOSIS — M546 Pain in thoracic spine: Secondary | ICD-10-CM | POA: Diagnosis not present

## 2013-02-13 DIAGNOSIS — M9981 Other biomechanical lesions of cervical region: Secondary | ICD-10-CM | POA: Diagnosis not present

## 2013-02-13 DIAGNOSIS — M531 Cervicobrachial syndrome: Secondary | ICD-10-CM | POA: Diagnosis not present

## 2013-02-13 DIAGNOSIS — M999 Biomechanical lesion, unspecified: Secondary | ICD-10-CM | POA: Diagnosis not present

## 2013-02-13 DIAGNOSIS — M546 Pain in thoracic spine: Secondary | ICD-10-CM | POA: Diagnosis not present

## 2013-02-19 ENCOUNTER — Other Ambulatory Visit: Payer: Self-pay | Admitting: Family Medicine

## 2013-02-20 DIAGNOSIS — M999 Biomechanical lesion, unspecified: Secondary | ICD-10-CM | POA: Diagnosis not present

## 2013-02-20 DIAGNOSIS — M546 Pain in thoracic spine: Secondary | ICD-10-CM | POA: Diagnosis not present

## 2013-02-20 DIAGNOSIS — M531 Cervicobrachial syndrome: Secondary | ICD-10-CM | POA: Diagnosis not present

## 2013-02-20 DIAGNOSIS — M9981 Other biomechanical lesions of cervical region: Secondary | ICD-10-CM | POA: Diagnosis not present

## 2013-03-06 ENCOUNTER — Other Ambulatory Visit: Payer: Self-pay | Admitting: Cardiovascular Disease

## 2013-03-06 DIAGNOSIS — M531 Cervicobrachial syndrome: Secondary | ICD-10-CM | POA: Diagnosis not present

## 2013-03-06 DIAGNOSIS — M9981 Other biomechanical lesions of cervical region: Secondary | ICD-10-CM | POA: Diagnosis not present

## 2013-03-06 DIAGNOSIS — M999 Biomechanical lesion, unspecified: Secondary | ICD-10-CM | POA: Diagnosis not present

## 2013-03-06 DIAGNOSIS — M546 Pain in thoracic spine: Secondary | ICD-10-CM | POA: Diagnosis not present

## 2013-03-08 ENCOUNTER — Other Ambulatory Visit: Payer: Self-pay | Admitting: Cardiovascular Disease

## 2013-03-21 DIAGNOSIS — Z23 Encounter for immunization: Secondary | ICD-10-CM | POA: Diagnosis not present

## 2013-04-03 DIAGNOSIS — M999 Biomechanical lesion, unspecified: Secondary | ICD-10-CM | POA: Diagnosis not present

## 2013-04-03 DIAGNOSIS — M546 Pain in thoracic spine: Secondary | ICD-10-CM | POA: Diagnosis not present

## 2013-04-03 DIAGNOSIS — M9981 Other biomechanical lesions of cervical region: Secondary | ICD-10-CM | POA: Diagnosis not present

## 2013-04-03 DIAGNOSIS — M531 Cervicobrachial syndrome: Secondary | ICD-10-CM | POA: Diagnosis not present

## 2013-05-31 ENCOUNTER — Telehealth: Payer: Self-pay | Admitting: *Deleted

## 2013-05-31 NOTE — Telephone Encounter (Signed)
Patient requests crestor samples. He is aware that they will be left at the front desk for pick up.

## 2013-06-03 ENCOUNTER — Ambulatory Visit (INDEPENDENT_AMBULATORY_CARE_PROVIDER_SITE_OTHER): Payer: Medicare Other | Admitting: Cardiovascular Disease

## 2013-06-03 ENCOUNTER — Encounter: Payer: Self-pay | Admitting: Cardiovascular Disease

## 2013-06-03 VITALS — BP 132/80 | HR 51 | Ht 67.0 in | Wt 166.0 lb

## 2013-06-03 DIAGNOSIS — I2510 Atherosclerotic heart disease of native coronary artery without angina pectoris: Secondary | ICD-10-CM

## 2013-06-03 DIAGNOSIS — I4891 Unspecified atrial fibrillation: Secondary | ICD-10-CM

## 2013-06-03 DIAGNOSIS — I714 Abdominal aortic aneurysm, without rupture, unspecified: Secondary | ICD-10-CM

## 2013-06-03 DIAGNOSIS — I10 Essential (primary) hypertension: Secondary | ICD-10-CM

## 2013-06-03 DIAGNOSIS — R0989 Other specified symptoms and signs involving the circulatory and respiratory systems: Secondary | ICD-10-CM

## 2013-06-03 DIAGNOSIS — I739 Peripheral vascular disease, unspecified: Secondary | ICD-10-CM

## 2013-06-03 DIAGNOSIS — J4489 Other specified chronic obstructive pulmonary disease: Secondary | ICD-10-CM

## 2013-06-03 DIAGNOSIS — E7800 Pure hypercholesterolemia, unspecified: Secondary | ICD-10-CM

## 2013-06-03 DIAGNOSIS — J449 Chronic obstructive pulmonary disease, unspecified: Secondary | ICD-10-CM

## 2013-06-03 MED ORDER — METOPROLOL TARTRATE 50 MG PO TABS: ORAL_TABLET | ORAL | Status: DC

## 2013-06-03 NOTE — Progress Notes (Signed)
Patient ID: [name] [name], male   DOB: June 08, 1941, 72 y.o.   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 he has seen Dr. [name] for surveillance of his AAA. He prefers to have his carotids and subclavian followed up here. I reviewed his from 12/11 Abdominal duplex and his Aoritic and renal grafts are fine. Reviewed his carotid which is also stable with wide open right CEA and 40-59% LICA needing F/U duplex this month He has not had any significant TIAs her left upper extremity arm weakness. Had a previous CABG. His last catheter was done 310 2008. His LIMA to the LAD was patent with a patent vein graft to the PDA and no significant disease in his circumflex. His LV function was normal. He denies SSCP and has chronic mild exertional dyspnea from lung disease.  Reviewed echo from 6/12 . Has significant SEM. NO AS suprisingly. Normal EF and only mild MR  Had his right knee replaced and has some chronic back pain he needs to F/U with Dr [name] [name]. Has had injection by [name] [name]  Duplex 7/14 reviewed 60-79% LICA   Exercising at [name] [name]  Borderline DM followed by Dr [name] [name]       ROS: Denies fever, malais, weight loss, blurry vision, decreased visual acuity, cough, sputum, SOB, hemoptysis, pleuritic pain, palpitaitons, heartburn, abdominal pain, melena, lower extremity edema, claudication, or rash.  All other systems reviewed and negative  General: Affect appropriate Healthy:  appears stated age HEENT: normal Neck supple with no adenopathy JVP normal bilateral  bruits no thyromegaly Lungs clear with no wheezing and good diaphragmatic motion Heart:  S1/S2 mild AS  murmur, no rub, gallop or click PMI normal Abdomen: benighn, BS positve, no tenderness, AAA scar  no bruit.  No HSM or HJR Distal pulses intact with no bruits No edema Neuro non-focal Skin warm and dry No muscular weakness   Current Outpatient Prescriptions  Medication Sig  Dispense Refill  . aspirin 325 MG tablet Take 325 mg by mouth daily.      . clopidogrel (PLAVIX) 75 MG tablet Take 1 tablet (75 mg total) by mouth daily.  30 tablet  5  . Coenzyme Q10 (CO Q 10 PO) Take 1 tablet by mouth daily.      . CRESTOR 20 MG tablet TAKE ONE (1) TABLET BY MOUTH EVERY DAY  30 tablet  3  . glucose blood test strip 1 each by Other route as needed for other. Use as instructed      . KRILL OIL PO Take 1 tablet by mouth daily.      . Lancets MISC by Does not apply route.      . lansoprazole (PREVACID) 15 MG capsule Take 15 mg by mouth daily.       [name] levothyroxine (SYNTHROID, LEVOTHROID) 125 MCG tablet TAKE ONE (1) TABLET EACH DAY  30 tablet  3  . metoprolol (LOPRESSOR) 50 MG tablet Take 1.5 tablets (75 mg total) by mouth 2 (two) times daily.  90 tablet  5  . niacin 500 MG tablet 500 mg. Take 3 qd      . nitroGLYCERIN (NITROSTAT) 0.4 MG SL tablet Place 1 tablet (0.4 mg total) under the tongue every 5 (five) minutes as needed for chest pain.  25 tablet  10  . triamterene-hydrochlorothiazide (MAXZIDE-25) 37.5-25 MG per tablet TAKE ONE TABLET EVERY MORNING  30 tablet  3   No current facility-administered medications for this visit.  Allergies  Imitrex and Lipitor  Electrocardiogram:  SB rate 48 RBBB   Today SB rate 51 PR 218 ICRBBB lateral T wave inversion minor change since previous   Assessment and Plan

## 2013-06-03 NOTE — Assessment & Plan Note (Signed)
Well controlled.  Continue current medications and low sodium Dash type diet.   Decrease lopressor to 50 bid for bradycardia

## 2013-06-03 NOTE — Assessment & Plan Note (Signed)
Cholesterol is at goal.  Continue current dose of statin and diet Rx.  No myalgias or side effects.  F/U  LFT's in 6 months. Lab Results  Component Value Date   LDLCALC 45 01/09/2013

## 2013-06-03 NOTE — Assessment & Plan Note (Signed)
Maint NSR  Evidence of SSS with bradycardia and long PR Decrease lopressor ASA

## 2013-06-03 NOTE — Assessment & Plan Note (Signed)
Stable no wheezing Has had flu shot F/U [employer]

## 2013-06-03 NOTE — Patient Instructions (Addendum)
Your physician wants you to follow-up in:   6 MONTHS  WITH  DR Haywood Filler will receive a reminder letter in the mail two months in advance. If you don't receive a letter, please call our office to schedule the follow-up appointment. Your physician has recommended you make the following change in your medication: DECREASE METOPROLOL TO  50 MG  TWICE  A DAY  Your physician has requested that you have a carotid duplex. This test is an ultrasound of the carotid arteries in your neck. It looks at blood flow through these arteries that supply the brain with blood. Allow one hour for this exam. There are no restrictions or special instructions. DUE IN  January  Your physician has requested that you have an abdominal aorta duplex. During this test, an ultrasound is used to evaluate the aorta. Allow 30 minutes for this exam. Do not eat after midnight the day before and avoid carbonated beverages HAVE DONE IN [place]

## 2013-06-03 NOTE — Assessment & Plan Note (Signed)
Good peripheral pulses Exercising at [facility] no claudication [place] in January for AAA repair

## 2013-06-03 NOTE — Assessment & Plan Note (Signed)
Stable with no angina and good activity level.  Continue medical Rx  

## 2013-06-26 ENCOUNTER — Other Ambulatory Visit: Payer: Self-pay | Admitting: Family Medicine

## 2013-06-26 ENCOUNTER — Other Ambulatory Visit: Payer: Self-pay | Admitting: Cardiovascular Disease

## 2013-07-04 ENCOUNTER — Ambulatory Visit (HOSPITAL_COMMUNITY): Payer: Medicare Other | Attending: Cardiovascular Disease

## 2013-07-04 DIAGNOSIS — I658 Occlusion and stenosis of other precerebral arteries: Secondary | ICD-10-CM | POA: Insufficient documentation

## 2013-07-04 DIAGNOSIS — I10 Essential (primary) hypertension: Secondary | ICD-10-CM | POA: Insufficient documentation

## 2013-07-04 DIAGNOSIS — Z951 Presence of aortocoronary bypass graft: Secondary | ICD-10-CM | POA: Diagnosis not present

## 2013-07-04 DIAGNOSIS — J449 Chronic obstructive pulmonary disease, unspecified: Secondary | ICD-10-CM | POA: Diagnosis not present

## 2013-07-04 DIAGNOSIS — R0989 Other specified symptoms and signs involving the circulatory and respiratory systems: Secondary | ICD-10-CM | POA: Diagnosis not present

## 2013-07-04 DIAGNOSIS — I6529 Occlusion and stenosis of unspecified carotid artery: Secondary | ICD-10-CM | POA: Insufficient documentation

## 2013-07-04 DIAGNOSIS — Z87891 Personal history of nicotine dependence: Secondary | ICD-10-CM | POA: Diagnosis not present

## 2013-07-04 DIAGNOSIS — E785 Hyperlipidemia, unspecified: Secondary | ICD-10-CM | POA: Diagnosis not present

## 2013-07-04 DIAGNOSIS — I2510 Atherosclerotic heart disease of native coronary artery without angina pectoris: Secondary | ICD-10-CM | POA: Diagnosis not present

## 2013-07-04 DIAGNOSIS — I739 Peripheral vascular disease, unspecified: Secondary | ICD-10-CM | POA: Diagnosis not present

## 2013-07-04 DIAGNOSIS — Z8673 Personal history of transient ischemic attack (TIA), and cerebral infarction without residual deficits: Secondary | ICD-10-CM | POA: Diagnosis not present

## 2013-07-04 DIAGNOSIS — J4489 Other specified chronic obstructive pulmonary disease: Secondary | ICD-10-CM | POA: Insufficient documentation

## 2013-07-08 ENCOUNTER — Ambulatory Visit (HOSPITAL_COMMUNITY): Payer: Medicare Other | Attending: Cardiovascular Disease

## 2013-07-08 ENCOUNTER — Encounter: Payer: Self-pay | Admitting: Cardiovascular Disease

## 2013-07-08 DIAGNOSIS — Z8673 Personal history of transient ischemic attack (TIA), and cerebral infarction without residual deficits: Secondary | ICD-10-CM | POA: Diagnosis not present

## 2013-07-08 DIAGNOSIS — I708 Atherosclerosis of other arteries: Secondary | ICD-10-CM | POA: Diagnosis not present

## 2013-07-08 DIAGNOSIS — Z87891 Personal history of nicotine dependence: Secondary | ICD-10-CM | POA: Insufficient documentation

## 2013-07-08 DIAGNOSIS — I2510 Atherosclerotic heart disease of native coronary artery without angina pectoris: Secondary | ICD-10-CM | POA: Diagnosis not present

## 2013-07-08 DIAGNOSIS — I701 Atherosclerosis of renal artery: Secondary | ICD-10-CM

## 2013-07-08 DIAGNOSIS — J449 Chronic obstructive pulmonary disease, unspecified: Secondary | ICD-10-CM | POA: Insufficient documentation

## 2013-07-08 DIAGNOSIS — I714 Abdominal aortic aneurysm, without rupture, unspecified: Secondary | ICD-10-CM

## 2013-07-08 DIAGNOSIS — Z951 Presence of aortocoronary bypass graft: Secondary | ICD-10-CM | POA: Insufficient documentation

## 2013-07-08 DIAGNOSIS — E785 Hyperlipidemia, unspecified: Secondary | ICD-10-CM | POA: Diagnosis not present

## 2013-07-08 DIAGNOSIS — I700 Atherosclerosis of aorta: Secondary | ICD-10-CM | POA: Diagnosis not present

## 2013-07-08 DIAGNOSIS — I10 Essential (primary) hypertension: Secondary | ICD-10-CM | POA: Diagnosis not present

## 2013-07-08 DIAGNOSIS — J4489 Other specified chronic obstructive pulmonary disease: Secondary | ICD-10-CM | POA: Insufficient documentation

## 2013-07-09 ENCOUNTER — Encounter: Payer: Self-pay | Admitting: Family Medicine

## 2013-07-09 ENCOUNTER — Encounter: Payer: Self-pay | Admitting: Vascular Surgery

## 2013-07-09 ENCOUNTER — Ambulatory Visit (INDEPENDENT_AMBULATORY_CARE_PROVIDER_SITE_OTHER): Payer: Medicare Other | Admitting: Family Medicine

## 2013-07-09 VITALS — BP 156/70 | Ht 67.5 in | Wt 167.0 lb

## 2013-07-09 DIAGNOSIS — R7301 Impaired fasting glucose: Secondary | ICD-10-CM

## 2013-07-09 DIAGNOSIS — I10 Essential (primary) hypertension: Secondary | ICD-10-CM

## 2013-07-09 DIAGNOSIS — R7309 Other abnormal glucose: Secondary | ICD-10-CM | POA: Diagnosis not present

## 2013-07-09 DIAGNOSIS — I4891 Unspecified atrial fibrillation: Secondary | ICD-10-CM

## 2013-07-09 DIAGNOSIS — Z125 Encounter for screening for malignant neoplasm of prostate: Secondary | ICD-10-CM | POA: Diagnosis not present

## 2013-07-09 DIAGNOSIS — E7800 Pure hypercholesterolemia, unspecified: Secondary | ICD-10-CM | POA: Diagnosis not present

## 2013-07-09 DIAGNOSIS — E039 Hypothyroidism, unspecified: Secondary | ICD-10-CM | POA: Diagnosis not present

## 2013-07-09 DIAGNOSIS — Z79899 Other long term (current) drug therapy: Secondary | ICD-10-CM | POA: Diagnosis not present

## 2013-07-09 DIAGNOSIS — J449 Chronic obstructive pulmonary disease, unspecified: Secondary | ICD-10-CM

## 2013-07-09 DIAGNOSIS — R7302 Impaired glucose tolerance (oral): Secondary | ICD-10-CM

## 2013-07-09 DIAGNOSIS — Z951 Presence of aortocoronary bypass graft: Secondary | ICD-10-CM | POA: Diagnosis not present

## 2013-07-09 LAB — HEPATIC FUNCTION PANEL
ALT: 20 U/L (ref 0–53)
AST: 27 U/L (ref 0–37)
Albumin: 4.4 g/dL (ref 3.5–5.2)
Alkaline Phosphatase: 81 U/L (ref 39–117)
BILIRUBIN DIRECT: 0.2 mg/dL (ref 0.0–0.3)
BILIRUBIN INDIRECT: 0.5 mg/dL (ref 0.0–0.9)
BILIRUBIN TOTAL: 0.7 mg/dL (ref 0.3–1.2)
Total Protein: 6.5 g/dL (ref 6.0–8.3)

## 2013-07-09 LAB — BASIC METABOLIC PANEL
BUN: 22 mg/dL (ref 6–23)
CALCIUM: 10.3 mg/dL (ref 8.4–10.5)
CO2: 31 meq/L (ref 19–32)
Chloride: 100 mEq/L (ref 96–112)
Creat: 0.85 mg/dL (ref 0.50–1.35)
GLUCOSE: 108 mg/dL — AB (ref 70–99)
Potassium: 3.7 mEq/L (ref 3.5–5.3)
SODIUM: 138 meq/L (ref 135–145)

## 2013-07-09 LAB — PSA, MEDICARE: PSA: 2.21 ng/mL (ref <=4.00)

## 2013-07-09 LAB — LIPID PANEL
CHOL/HDL RATIO: 2.6 ratio
Cholesterol: 126 mg/dL (ref 0–200)
HDL: 48 mg/dL (ref >39)
LDL Cholesterol: 53 mg/dL (ref 0–99)
Triglycerides: 124 mg/dL (ref <150)
VLDL: 25 mg/dL (ref 0–40)

## 2013-07-09 LAB — TSH: TSH: 0.63 u[IU]/mL (ref 0.350–4.500)

## 2013-07-09 LAB — POCT GLYCOSYLATED HEMOGLOBIN (HGB A1C): Hemoglobin A1C: 5.7

## 2013-07-10 ENCOUNTER — Encounter: Payer: Medicare Other | Admitting: Vascular Surgery

## 2013-07-14 DIAGNOSIS — R7301 Impaired fasting glucose: Secondary | ICD-10-CM | POA: Insufficient documentation

## 2013-07-14 NOTE — Progress Notes (Signed)
   Subjective:    Patient ID: [name] [name], male    DOB: 01-04-1941, 73 y.o.   MRN: [id number]  HPI   Patient arrives office for followup of his many chronic concerns. He claims compliance with his lipid medicine. No obvious side effects. Trying to watch his diet.  And compliance with his blood pressure medicine. Has cut salt down. Not walking as much as he hoped. No obvious side effects from medicines.  Known history of impaired fasting glucose. Has cut his sugar down. Does really checked on a some.  History of coronary artery disease. Currently asymptomatic.  Plan with thyroid dosage in. No excessive fatigue decent energy level.  Review of Systems No headache no chest pain no back pain no change in bowel habits no blood in stool no rash ROS otherwise negative.    Objective:   Physical Exam  Alert anxious appearing. Vitals stable HEENT normal. Lungs diminished breath sounds diffusely no wheezes no crackles no tachypnea heart regular in rhythm. Ankles without edema.      Assessment & Plan:  Impression 1 impaired fasting glucose discussed A1c still prediabetic. #2 hypertension good control. #3 hyperlipidemia status uncertain. #4 hypothyroidism status uncertain. #5 coronary artery disease #6 COPD plan diet exercise discussed. Recheck in several months wellness exam. Appropriate blood work. WSL

## 2013-07-15 ENCOUNTER — Encounter: Payer: Self-pay | Admitting: Vascular Surgery

## 2013-07-15 ENCOUNTER — Telehealth: Payer: Self-pay | Admitting: Cardiovascular Disease

## 2013-07-15 NOTE — Telephone Encounter (Signed)
New message          Pt returning nurses call

## 2013-07-15 NOTE — Telephone Encounter (Signed)
PT AWARE  OF  AAA RESULTS./CY

## 2013-07-16 ENCOUNTER — Encounter (HOSPITAL_COMMUNITY): Payer: Self-pay | Admitting: Pharmacy Technician

## 2013-07-16 ENCOUNTER — Other Ambulatory Visit: Payer: Self-pay | Admitting: *Deleted

## 2013-07-16 ENCOUNTER — Encounter: Payer: Self-pay | Admitting: Vascular Surgery

## 2013-07-16 ENCOUNTER — Ambulatory Visit (INDEPENDENT_AMBULATORY_CARE_PROVIDER_SITE_OTHER): Payer: Medicare Other | Admitting: Vascular Surgery

## 2013-07-16 ENCOUNTER — Encounter (INDEPENDENT_AMBULATORY_CARE_PROVIDER_SITE_OTHER): Payer: Self-pay

## 2013-07-16 ENCOUNTER — Encounter: Payer: Self-pay | Admitting: *Deleted

## 2013-07-16 VITALS — BP 138/75 | HR 65 | Resp 18 | Ht 67.5 in | Wt 166.0 lb

## 2013-07-16 DIAGNOSIS — I6529 Occlusion and stenosis of unspecified carotid artery: Secondary | ICD-10-CM

## 2013-07-16 NOTE — Progress Notes (Signed)
Subjective:     Patient ID: [name] [name], male   DOB: 29-May-1941, 73 y.o.   MRN: [id number]  HPI this 73 year old male was referred by Dr. Frances Nickels for evaluation of left carotid endarterectomy. Patient is well-known to me. I performed a right carotid endarterectomy on him in 2005 and also placed left subclavian stents in 2007. He has had coronary artery bypass grafting in 1995 and had abdominal aortic aneurysm and bilateral renal grafts at [facility] in 1994. Patient currently denies any neurologic symptoms such as aphasia, procedures, lateralizing weakness, syncope, diplopia, blurred vision. He was found to have a greater than 90% left ICA stenosis and was referred for evaluation.  Past Medical History  Diagnosis Date  . HYPOTHYROIDISM   . Pure hypercholesterolemia   . HYPERTENSION   . GERD   . DIVERTICULOSIS OF COLON   . JOINT EFFUSION, KNEE   . OSTEOPOROSIS   . Other dysphagia   . NEPHROLITHIASIS, HX OF   . Myocardial infarction     1994   . CORONARY ARTERY DISEASE   . Atrial fibrillation     no hx of reported at preop visit of 04/21/11   . PERIPHERAL VASCULAR DISEASE     AAA - 1994 with reimplant of renals   . Peripheral vascular disease     subclavian stenosis PTA - 3/08   . CHRONIC OBSTRUCTIVE PULMONARY DISEASE     pt denied at visit of 04/21/11   . Chronic kidney disease     AAA repair with reimplant of renals   . KNEE, ARTHRITIS, DEGEN./OSTEO     right knee   . Renal artery stenosis   . CAD (coronary artery disease)   . Carotid artery occlusion     left carotid endarterectomy    History  Substance Use Topics  . Smoking status: Former Smoker    Types: Cigarettes    Quit date: 06/20/1992  . Smokeless tobacco: Never Used  . Alcohol Use: No    Family History  Problem Relation Age of Onset  . Hypertension Mother   . Heart disease Father     Allergies  Allergen Reactions  . Imitrex [Sumatriptan]   . Lipitor [Atorvastatin]     Current outpatient  prescriptions:aspirin 325 MG tablet, Take 325 mg by mouth daily., Disp: , Rfl: ;  clopidogrel (PLAVIX) 75 MG tablet, TAKE ONE (1) TABLET BY MOUTH EVERY DAY, Disp: 30 tablet, Rfl: 3;  Coenzyme Q10 (CO Q 10 PO), Take 1 tablet by mouth daily., Disp: , Rfl: ;  CRESTOR 20 MG tablet, TAKE ONE (1) TABLET BY MOUTH EVERY DAY, Disp: 30 tablet, Rfl: 3 glucose blood test strip, 1 each by Other route as needed for other. Use as instructed, Disp: , Rfl: ;  KRILL OIL PO, Take 1 tablet by mouth daily., Disp: , Rfl: ;  Lancets MISC, by Does not apply route., Disp: , Rfl: ;  lansoprazole (PREVACID) 15 MG capsule, Take 15 mg by mouth daily. , Disp: , Rfl: ;  levothyroxine (SYNTHROID, LEVOTHROID) 125 MCG tablet, TAKE ONE (1) TABLET EACH DAY, Disp: 30 tablet, Rfl: 1 metoprolol (LOPRESSOR) 50 MG tablet, Take 1 tablet (50 mg total) by mouth 2 (two) times daily., Disp: 60 tablet, Rfl: 3;  niacin 500 MG tablet, 500 mg. Take 3 qd, Disp: , Rfl: ;  nitroGLYCERIN (NITROSTAT) 0.4 MG SL tablet, Place 1 tablet (0.4 mg total) under the tongue every 5 (five) minutes as needed for chest pain., Disp: 25 tablet, Rfl: 10 triamterene-hydrochlorothiazide (  MAXZIDE-25) 37.5-25 MG per tablet, TAKE ONE TABLET EVERY MORNING, Disp: 30 tablet, Rfl: 3  BP 138/75  Pulse 65  Resp 18  Ht 5' 7.5" (1.715 m)  Wt 166 lb (75.297 kg)  BMI 25.60 kg/m2  Body mass index is 25.6 kg/(m^2).           Review of Systems denies chest pain, dyspnea on exertion, PND, orthopnea, claudication-able to ambulate 2-3 miles. All other systems are negative and a complete review of systems    Objective:   Physical Exam BP 138/75  Pulse 65  Resp 18  Ht 5' 7.5" (1.715 m)  Wt 166 lb (75.297 kg)  BMI 25.60 kg/m2  Gen.-alert and oriented x3 in no apparent distress HEENT normal for age Lungs no rhonchi or wheezing Cardiovascular regular rhythm no murmurs carotid pulses 3+-palpable-bilateral harsh carotid bruits left greater than right Abdomen soft nontender no  palpable masses Musculoskeletal free of  major deformities Skin clear -no rashes Neurologic normal Lower extremities 3+ femoral and posterior tibial pulses with 2+ dorsalis pedis pulses palpable. Both feet warm and well perfused with no edema.  Today I reviewed the carotid duplex exam performed at [facility] heart care and this reveals a 90+ percent left ICA stenosis a widely patent right carotid endarterectomy site. Patient does have innominate disease. Both subclavian arteries are patent.        Assessment:     95% left ICA stenosis-asymptomatic-status post right carotid endarterectomy in 2005 Coronary artery disease-stable and asymptomatic with coronary artery bypass grafting 1995    Plan:     Plan left carotid endarterectomy on Friday, January 30-risks and benefits thoroughly discussed with patient and he would like to proceed  Will hold Plavix after today's dose and resume on Saturday, January 31 and continue on daily aspirin preop

## 2013-07-17 ENCOUNTER — Encounter (HOSPITAL_COMMUNITY)
Admission: RE | Admit: 2013-07-17 | Discharge: 2013-07-17 | Disposition: A | Discharge status: Home / Self Care | Payer: Medicare Other | Source: Ambulatory Visit | Attending: Anesthesiology | Admitting: Anesthesiology

## 2013-07-17 ENCOUNTER — Encounter (HOSPITAL_COMMUNITY): Payer: Self-pay

## 2013-07-17 ENCOUNTER — Encounter: Payer: Self-pay | Admitting: Family Medicine

## 2013-07-17 ENCOUNTER — Encounter (HOSPITAL_COMMUNITY)
Admission: RE | Admit: 2013-07-17 | Discharge: 2013-07-17 | Disposition: A | Discharge status: Home / Self Care | Payer: Medicare Other | Source: Ambulatory Visit | Attending: Vascular Surgery | Admitting: Vascular Surgery

## 2013-07-17 DIAGNOSIS — K219 Gastro-esophageal reflux disease without esophagitis: Secondary | ICD-10-CM | POA: Diagnosis not present

## 2013-07-17 DIAGNOSIS — M129 Arthropathy, unspecified: Secondary | ICD-10-CM | POA: Diagnosis present

## 2013-07-17 DIAGNOSIS — Z01818 Encounter for other preprocedural examination: Secondary | ICD-10-CM | POA: Diagnosis not present

## 2013-07-17 DIAGNOSIS — M810 Age-related osteoporosis without current pathological fracture: Secondary | ICD-10-CM | POA: Diagnosis present

## 2013-07-17 DIAGNOSIS — I739 Peripheral vascular disease, unspecified: Secondary | ICD-10-CM | POA: Diagnosis present

## 2013-07-17 DIAGNOSIS — E7800 Pure hypercholesterolemia, unspecified: Secondary | ICD-10-CM | POA: Diagnosis present

## 2013-07-17 DIAGNOSIS — I4891 Unspecified atrial fibrillation: Secondary | ICD-10-CM | POA: Diagnosis not present

## 2013-07-17 DIAGNOSIS — Z951 Presence of aortocoronary bypass graft: Secondary | ICD-10-CM | POA: Diagnosis not present

## 2013-07-17 DIAGNOSIS — I129 Hypertensive chronic kidney disease with stage 1 through stage 4 chronic kidney disease, or unspecified chronic kidney disease: Secondary | ICD-10-CM | POA: Diagnosis present

## 2013-07-17 DIAGNOSIS — J4489 Other specified chronic obstructive pulmonary disease: Secondary | ICD-10-CM | POA: Diagnosis not present

## 2013-07-17 DIAGNOSIS — I2510 Atherosclerotic heart disease of native coronary artery without angina pectoris: Secondary | ICD-10-CM | POA: Diagnosis present

## 2013-07-17 DIAGNOSIS — Z7982 Long term (current) use of aspirin: Secondary | ICD-10-CM | POA: Diagnosis not present

## 2013-07-17 DIAGNOSIS — E039 Hypothyroidism, unspecified: Secondary | ICD-10-CM | POA: Diagnosis present

## 2013-07-17 DIAGNOSIS — I6529 Occlusion and stenosis of unspecified carotid artery: Secondary | ICD-10-CM | POA: Diagnosis not present

## 2013-07-17 DIAGNOSIS — N189 Chronic kidney disease, unspecified: Secondary | ICD-10-CM | POA: Diagnosis present

## 2013-07-17 DIAGNOSIS — I701 Atherosclerosis of renal artery: Secondary | ICD-10-CM | POA: Diagnosis not present

## 2013-07-17 DIAGNOSIS — J984 Other disorders of lung: Secondary | ICD-10-CM | POA: Diagnosis not present

## 2013-07-17 DIAGNOSIS — I252 Old myocardial infarction: Secondary | ICD-10-CM | POA: Diagnosis not present

## 2013-07-17 DIAGNOSIS — J449 Chronic obstructive pulmonary disease, unspecified: Secondary | ICD-10-CM | POA: Diagnosis not present

## 2013-07-17 DIAGNOSIS — Z87891 Personal history of nicotine dependence: Secondary | ICD-10-CM | POA: Diagnosis not present

## 2013-07-17 HISTORY — DX: Headache: R51

## 2013-07-17 HISTORY — DX: Personal history of other diseases of the digestive system: Z87.19

## 2013-07-17 LAB — CBC
HCT: 44.6 % (ref 39.0–52.0)
Hemoglobin: 16.3 g/dL (ref 13.0–17.0)
MCH: 31.5 pg (ref 26.0–34.0)
MCHC: 36.5 g/dL — ABNORMAL HIGH (ref 30.0–36.0)
MCV: 86.1 fL (ref 78.0–100.0)
Platelets: 157 10*3/uL (ref 150–400)
RBC: 5.18 MIL/uL (ref 4.22–5.81)
RDW: 13.5 % (ref 11.5–15.5)
WBC: 7.5 10*3/uL (ref 4.0–10.5)

## 2013-07-17 LAB — PROTIME-INR
INR: 1 (ref 0.00–1.49)
Prothrombin Time: 13 seconds (ref 11.6–15.2)

## 2013-07-17 LAB — COMPREHENSIVE METABOLIC PANEL
ALT: 26 U/L (ref 0–53)
AST: 27 U/L (ref 0–37)
Albumin: 4.1 g/dL (ref 3.5–5.2)
Alkaline Phosphatase: 92 U/L (ref 39–117)
BUN: 21 mg/dL (ref 6–23)
CALCIUM: 9.5 mg/dL (ref 8.4–10.5)
CO2: 27 meq/L (ref 19–32)
Chloride: 100 mEq/L (ref 96–112)
Creatinine, Ser: 0.91 mg/dL (ref 0.50–1.35)
GFR calc Af Amer: >90 mL/min (ref >90)
GFR, EST NON AFRICAN AMERICAN: 81 mL/min — AB (ref >90)
Glucose, Bld: 110 mg/dL — ABNORMAL HIGH (ref 70–99)
Potassium: 3.7 mEq/L (ref 3.7–5.3)
SODIUM: 139 meq/L (ref 137–147)
Total Bilirubin: 0.4 mg/dL (ref 0.3–1.2)
Total Protein: 7.1 g/dL (ref 6.0–8.3)

## 2013-07-17 LAB — URINALYSIS, ROUTINE W REFLEX MICROSCOPIC
BILIRUBIN URINE: NEGATIVE
Glucose, UA: NEGATIVE mg/dL
Hgb urine dipstick: NEGATIVE
KETONES UR: NEGATIVE mg/dL
Nitrite: NEGATIVE
PH: 6 (ref 5.0–8.0)
PROTEIN: NEGATIVE mg/dL
Specific Gravity, Urine: 1.012 (ref 1.005–1.030)
Urobilinogen, UA: 1 mg/dL (ref 0.0–1.0)

## 2013-07-17 LAB — APTT: aPTT: 29 seconds (ref 24–37)

## 2013-07-17 LAB — URINE MICROSCOPIC-ADD ON

## 2013-07-17 LAB — ABO/RH: ABO/RH(D): O POS

## 2013-07-17 LAB — SURGICAL PCR SCREEN
MRSA, PCR: NEGATIVE
Staphylococcus aureus: NEGATIVE

## 2013-07-17 LAB — PREPARE RBC (CROSSMATCH)

## 2013-07-17 NOTE — Pre-Procedure Instructions (Signed)
LLEWYN HEAP  07/17/2013   Your procedure is scheduled on:  Friday, July 19, 2013 at 7:30 AM  Report to [facility] [facility] (use Main Entrance "A'') at 5:30 AM.  Call this number if you have problems the morning of surgery: [phone]   Remember:   Do not eat food or drink liquids after midnight.   Take these medicines the morning of surgery with A SIP OF WATER: lansoprazole (PREVACID) 15, levothyroxine (SYNTHROID, LEVOTHROID) 125 MCG tablet   MG capsule, metoprolol (LOPRESSOR) 50 MG tablet, if needed: nitroGLYCERIN (NITROSTAT) 0.4 MG SL tablet for chest pain. KRILL OIL PO Stop taking herbal medications  (Coenzyme Q10 (CO Q 10 PO),  KRILL OIL PO ) Do not take any NSAIDs ie: Ibuprofen, Advil, Naproxen or Aleve  Do not wear jewelry, make-up or nail polish.  Do not wear lotions, powders, or perfumes. You may wear deodorant.  Do not shave 48 hours prior to surgery.   Do not bring valuables to the hospital.  [facility] is not responsible  for any belongings or valuables.               Contacts, dentures or bridgework may not be worn into surgery.  Leave suitcase in the car. After surgery it may be brought to your room  For patients admitted to the hospital, discharge time is determined by your  treatment team.               Patients discharged the day of surgery will not be allowed to drive home.  Name and phone number of your driver:   Special Instructions: Shower using CHG 2 nights before surgery and the night before surgery.  If you shower the day of surgery use CHG.  Use special wash - you have one bottle of CHG for all showers.  You should use approximately 1/3 of the bottle for each shower.   Please read over the following fact sheets that you were given: Pain Booklet, Coughing and Deep Breathing, Blood Transfusion Information, MRSA Information and Surgical Site Infection Prevention

## 2013-07-17 NOTE — Progress Notes (Signed)
Pt denies SOB and chest pain. Pt is under the care of Dr. Johnsie Cancel (cardiology). According to pt " I was told to stop taking Plavix ( last dose taken 07/16/13 ) and continue with taking the Aspirin." Pt stated that he takes Aspirin at night. Pt chart forwarded to [place], [place] ( anesthesia) for review.

## 2013-07-18 MED ORDER — DEXTROSE 5 % IV SOLN
1.5000 g | INTRAVENOUS | Status: AC
  Administered 2013-07-19: 1.5 g via INTRAVENOUS
  Filled 2013-07-18: qty 1.5

## 2013-07-18 NOTE — Progress Notes (Signed)
Anesthesia Chart Review:  Patient is a 73 year old male scheduled for left CEA on 07/19/13 by Dr. Kellie Simmering.  History includes former smoker,PAD s/p right CEA '05 and left SCA stents '07, CAD/MI s/p CABG '95, AAA repair with re-implantation of bilateral renal arteries '94 [facility]), HTN, hypothyroidism, impaired fasting glucose. Afib and COPD are also listed although he denied. PCP is Dr. Grace Bushy. [place].  Cardiologist is Dr. Johnsie Cancel, who referred patient to [facility] for surgery for his left carotid stenosis.  Continued medical therapy was recommended for his CAD.  Echo on 11/19/10 showed:  - Left ventricle: The cavity size was normal. Wall thickness was increased in a pattern of mild LVH. Systolic function was normal. The estimated ejection fraction was in the range of 60% to 65%. Wall motion was normal; there were no regional wall motion abnormalities. Doppler parameters are consistent with abnormal left ventricular relaxation (grade 1 diastolic dysfunction). - Left atrium: The atrium was mildly dilated. - Pulmonary arteries: PA peak pressure: 38mm Hg (S). - Trivial MR, mild PR.  His last Myoview was abnormal in early 2009, however, Dr. Johnsie Cancel continued to recommend medical therapy as his cath in 08/28/06 showed stable anatomy since 2001 ("In particular the rim of the LAD is widely patent, as the vein graft to the PDA and the native circ does not have critical disease. His LV function continues to be normal." See full report under Notes tab.)  EKG on 06/03/13 showed SB @ 51 bpm, right BBB, T wave abnormality, consider lateral ischemia.  Dr. Johnsie Cancel felt there were only minor changes since his previous tracing.  Carotid duplex on 07/04/13 showed 63-84% RICAS, 66-59% LICAS.  CXR on 07/17/13 showed: 1. There is no evidence of CHF nor pneumonia. The patient has undergone previous CABG.  2. There are stable coarse lung markings in the retrocardiac region on the left which likely reflect scarring.  3. There is no  evidence of a pulmonary parenchymal mass or pleural effusion.  Preoperative labs noted.    Patient with significant CAD/PAD history.  He was recently evaluated by his cardiologist who did not recommend additional cardiac testing, and is the one who referred patient to [facility] to undergo left CEA.  If no acute changes then I would anticipate that he could proceed as planned.  George [facility] Short Stay Center/Anesthesiology Phone [phone] 07/18/2013 10:30 AM

## 2013-07-19 ENCOUNTER — Encounter (HOSPITAL_COMMUNITY)
Admission: RE | Disposition: A | Discharge status: Home / Self Care | Payer: Self-pay | Source: Ambulatory Visit | Attending: Vascular Surgery

## 2013-07-19 ENCOUNTER — Encounter (HOSPITAL_COMMUNITY): Payer: Medicare Other | Admitting: Vascular Surgery

## 2013-07-19 ENCOUNTER — Inpatient Hospital Stay (HOSPITAL_COMMUNITY): Payer: Medicare Other | Admitting: Anesthesiology

## 2013-07-19 ENCOUNTER — Encounter (HOSPITAL_COMMUNITY): Payer: Self-pay | Admitting: *Deleted

## 2013-07-19 ENCOUNTER — Inpatient Hospital Stay (HOSPITAL_COMMUNITY)
Admission: RE | Admit: 2013-07-19 | Discharge: 2013-07-21 | DRG: 039 | Disposition: A | Discharge status: Home / Self Care | Payer: Medicare Other | Source: Ambulatory Visit | Attending: Vascular Surgery | Admitting: Vascular Surgery

## 2013-07-19 DIAGNOSIS — I739 Peripheral vascular disease, unspecified: Secondary | ICD-10-CM | POA: Diagnosis present

## 2013-07-19 DIAGNOSIS — I252 Old myocardial infarction: Secondary | ICD-10-CM

## 2013-07-19 DIAGNOSIS — M810 Age-related osteoporosis without current pathological fracture: Secondary | ICD-10-CM | POA: Diagnosis present

## 2013-07-19 DIAGNOSIS — I2510 Atherosclerotic heart disease of native coronary artery without angina pectoris: Secondary | ICD-10-CM | POA: Diagnosis present

## 2013-07-19 DIAGNOSIS — N189 Chronic kidney disease, unspecified: Secondary | ICD-10-CM | POA: Diagnosis present

## 2013-07-19 DIAGNOSIS — I4891 Unspecified atrial fibrillation: Secondary | ICD-10-CM | POA: Diagnosis present

## 2013-07-19 DIAGNOSIS — E039 Hypothyroidism, unspecified: Secondary | ICD-10-CM | POA: Diagnosis present

## 2013-07-19 DIAGNOSIS — I129 Hypertensive chronic kidney disease with stage 1 through stage 4 chronic kidney disease, or unspecified chronic kidney disease: Secondary | ICD-10-CM | POA: Diagnosis present

## 2013-07-19 DIAGNOSIS — I6529 Occlusion and stenosis of unspecified carotid artery: Secondary | ICD-10-CM

## 2013-07-19 DIAGNOSIS — M129 Arthropathy, unspecified: Secondary | ICD-10-CM | POA: Diagnosis present

## 2013-07-19 DIAGNOSIS — Z87891 Personal history of nicotine dependence: Secondary | ICD-10-CM

## 2013-07-19 DIAGNOSIS — J449 Chronic obstructive pulmonary disease, unspecified: Secondary | ICD-10-CM | POA: Diagnosis present

## 2013-07-19 DIAGNOSIS — K219 Gastro-esophageal reflux disease without esophagitis: Secondary | ICD-10-CM | POA: Diagnosis present

## 2013-07-19 DIAGNOSIS — J4489 Other specified chronic obstructive pulmonary disease: Secondary | ICD-10-CM | POA: Diagnosis present

## 2013-07-19 DIAGNOSIS — Z7982 Long term (current) use of aspirin: Secondary | ICD-10-CM

## 2013-07-19 DIAGNOSIS — I701 Atherosclerosis of renal artery: Secondary | ICD-10-CM | POA: Diagnosis present

## 2013-07-19 DIAGNOSIS — E7800 Pure hypercholesterolemia, unspecified: Secondary | ICD-10-CM | POA: Diagnosis present

## 2013-07-19 HISTORY — PX: CAROTID ENDARTERECTOMY: SUR193

## 2013-07-19 HISTORY — PX: ENDARTERECTOMY: SHX5162

## 2013-07-19 SURGERY — ENDARTERECTOMY, CAROTID
Anesthesia: General | Site: Neck | Laterality: Left

## 2013-07-19 MED ORDER — ONDANSETRON HCL 4 MG/2ML IJ SOLN: 4.0000 mg | Freq: Once | INTRAMUSCULAR | Status: DC | PRN

## 2013-07-19 MED ORDER — 0.9 % SODIUM CHLORIDE (POUR BTL) OPTIME
TOPICAL | Status: DC | PRN
  Administered 2013-07-19: 2000 mL

## 2013-07-19 MED ORDER — CLOPIDOGREL BISULFATE 75 MG PO TABS
75.0000 mg | ORAL_TABLET | Freq: Every day | ORAL | Status: DC
  Administered 2013-07-20: 75 mg via ORAL
  Filled 2013-07-19 (×3): qty 1

## 2013-07-19 MED ORDER — ACETAMINOPHEN 650 MG RE SUPP: 325.0000 mg | RECTAL | Status: DC | PRN

## 2013-07-19 MED ORDER — PROPOFOL 10 MG/ML IV BOLUS
INTRAVENOUS | Status: AC
  Filled 2013-07-19: qty 20

## 2013-07-19 MED ORDER — NEOSTIGMINE METHYLSULFATE 1 MG/ML IJ SOLN
INTRAMUSCULAR | Status: DC | PRN
  Administered 2013-07-19: 3 mg via INTRAVENOUS

## 2013-07-19 MED ORDER — HEPARIN SODIUM (PORCINE) 1000 UNIT/ML IJ SOLN
INTRAMUSCULAR | Status: AC
  Filled 2013-07-19: qty 1

## 2013-07-19 MED ORDER — FENTANYL CITRATE 0.05 MG/ML IJ SOLN
INTRAMUSCULAR | Status: DC | PRN
  Administered 2013-07-19: 50 ug via INTRAVENOUS
  Administered 2013-07-19: 100 ug via INTRAVENOUS
  Administered 2013-07-19 (×2): 50 ug via INTRAVENOUS

## 2013-07-19 MED ORDER — ONDANSETRON HCL 4 MG/2ML IJ SOLN
INTRAMUSCULAR | Status: AC
  Filled 2013-07-19: qty 2

## 2013-07-19 MED ORDER — DOCUSATE SODIUM 100 MG PO CAPS: 100.0000 mg | ORAL_CAPSULE | Freq: Every day | ORAL | Status: DC

## 2013-07-19 MED ORDER — SODIUM CHLORIDE 0.9 % IV SOLN
INTRAVENOUS | Status: DC
  Administered 2013-07-19: 17:00:00 via INTRAVENOUS

## 2013-07-19 MED ORDER — OXYCODONE HCL 5 MG PO TABS: 5.0000 mg | ORAL_TABLET | Freq: Four times a day (QID) | ORAL | Status: DC | PRN

## 2013-07-19 MED ORDER — ACETAMINOPHEN 160 MG/5ML PO SOLN
325.0000 mg | ORAL | Status: DC | PRN
  Filled 2013-07-19: qty 20.3

## 2013-07-19 MED ORDER — LEVOTHYROXINE SODIUM 125 MCG PO TABS
125.0000 ug | ORAL_TABLET | Freq: Every day | ORAL | Status: DC
  Administered 2013-07-20: 125 ug via ORAL
  Filled 2013-07-19 (×3): qty 1

## 2013-07-19 MED ORDER — THROMBIN 20000 UNITS EX SOLR
CUTANEOUS | Status: DC | PRN
  Administered 2013-07-19: 20000 [IU] via TOPICAL

## 2013-07-19 MED ORDER — FENTANYL CITRATE 0.05 MG/ML IJ SOLN
INTRAMUSCULAR | Status: AC
  Filled 2013-07-19: qty 5

## 2013-07-19 MED ORDER — ACETAMINOPHEN 325 MG PO TABS
325.0000 mg | ORAL_TABLET | ORAL | Status: DC | PRN
  Administered 2013-07-19 – 2013-07-21 (×4): 650 mg via ORAL
  Filled 2013-07-19 (×4): qty 2

## 2013-07-19 MED ORDER — PROPOFOL 10 MG/ML IV BOLUS
INTRAVENOUS | Status: DC | PRN
  Administered 2013-07-19: 100 mg via INTRAVENOUS

## 2013-07-19 MED ORDER — ALUM & MAG HYDROXIDE-SIMETH 200-200-20 MG/5ML PO SUSP: 15.0000 mL | ORAL | Status: DC | PRN

## 2013-07-19 MED ORDER — ASPIRIN 325 MG PO TABS
325.0000 mg | ORAL_TABLET | Freq: Every day | ORAL | Status: DC
Start: 2013-07-20 — End: 2013-07-21
  Administered 2013-07-20: 325 mg via ORAL
  Filled 2013-07-19 (×2): qty 1

## 2013-07-19 MED ORDER — PANTOPRAZOLE SODIUM 40 MG PO TBEC
40.0000 mg | DELAYED_RELEASE_TABLET | Freq: Every day | ORAL | Status: DC
  Administered 2013-07-20: 40 mg via ORAL
  Filled 2013-07-19: qty 1

## 2013-07-19 MED ORDER — TRIAMTERENE-HCTZ 37.5-25 MG PO TABS
1.0000 | ORAL_TABLET | Freq: Every day | ORAL | Status: DC
  Filled 2013-07-19 (×3): qty 1

## 2013-07-19 MED ORDER — METOPROLOL TARTRATE 50 MG PO TABS
50.0000 mg | ORAL_TABLET | Freq: Two times a day (BID) | ORAL | Status: DC
  Filled 2013-07-19 (×5): qty 1

## 2013-07-19 MED ORDER — PHENOL 1.4 % MT LIQD: 1.0000 | OROMUCOSAL | Status: DC | PRN

## 2013-07-19 MED ORDER — HYDROMORPHONE HCL PF 1 MG/ML IJ SOLN: 0.2500 mg | INTRAMUSCULAR | Status: DC | PRN

## 2013-07-19 MED ORDER — METOPROLOL TARTRATE 1 MG/ML IV SOLN: 2.0000 mg | INTRAVENOUS | Status: DC | PRN

## 2013-07-19 MED ORDER — THROMBIN 20000 UNITS EX SOLR
CUTANEOUS | Status: AC
  Filled 2013-07-19: qty 20000

## 2013-07-19 MED ORDER — GUAIFENESIN-DM 100-10 MG/5ML PO SYRP: 15.0000 mL | ORAL_SOLUTION | ORAL | Status: DC | PRN

## 2013-07-19 MED ORDER — HEPARIN SODIUM (PORCINE) 1000 UNIT/ML IJ SOLN
INTRAMUSCULAR | Status: DC | PRN
  Administered 2013-07-19: 6000 [IU] via INTRAVENOUS

## 2013-07-19 MED ORDER — GLYCOPYRROLATE 0.2 MG/ML IJ SOLN
INTRAMUSCULAR | Status: DC | PRN
  Administered 2013-07-19: 0.2 mg via INTRAVENOUS
  Administered 2013-07-19: .4 mg via INTRAVENOUS

## 2013-07-19 MED ORDER — LIDOCAINE HCL (CARDIAC) 20 MG/ML IV SOLN
INTRAVENOUS | Status: DC | PRN
  Administered 2013-07-19: 70 mg via INTRAVENOUS

## 2013-07-19 MED ORDER — ROCURONIUM BROMIDE 50 MG/5ML IV SOLN
INTRAVENOUS | Status: AC
  Filled 2013-07-19: qty 1

## 2013-07-19 MED ORDER — ACETAMINOPHEN 325 MG PO TABS
325.0000 mg | ORAL_TABLET | ORAL | Status: DC | PRN
Start: 2013-07-19 — End: 2013-07-19

## 2013-07-19 MED ORDER — PHENYLEPHRINE HCL 10 MG/ML IJ SOLN
10.0000 mg | INTRAVENOUS | Status: DC | PRN
  Administered 2013-07-19: 25 ug/min via INTRAVENOUS

## 2013-07-19 MED ORDER — NITROGLYCERIN 0.4 MG SL SUBL: 0.4000 mg | SUBLINGUAL_TABLET | SUBLINGUAL | Status: DC | PRN

## 2013-07-19 MED ORDER — DEXTROSE 5 % IV SOLN
1.5000 g | Freq: Two times a day (BID) | INTRAVENOUS | Status: AC
  Administered 2013-07-19 – 2013-07-20 (×2): 1.5 g via INTRAVENOUS
  Filled 2013-07-19 (×2): qty 1.5

## 2013-07-19 MED ORDER — PROTAMINE SULFATE 10 MG/ML IV SOLN
INTRAVENOUS | Status: DC | PRN
  Administered 2013-07-19: 50 mg via INTRAVENOUS

## 2013-07-19 MED ORDER — OXYCODONE HCL 5 MG PO TABS: 5.0000 mg | ORAL_TABLET | ORAL | Status: DC | PRN

## 2013-07-19 MED ORDER — LABETALOL HCL 5 MG/ML IV SOLN: 10.0000 mg | INTRAVENOUS | Status: DC | PRN

## 2013-07-19 MED ORDER — NIACIN 500 MG PO TABS
1500.0000 mg | ORAL_TABLET | Freq: Every day | ORAL | Status: DC
  Administered 2013-07-20: 1500 mg via ORAL
  Filled 2013-07-19 (×3): qty 3

## 2013-07-19 MED ORDER — OXYCODONE HCL 5 MG PO TABS: 5.0000 mg | ORAL_TABLET | Freq: Once | ORAL | Status: DC | PRN

## 2013-07-19 MED ORDER — ONDANSETRON HCL 4 MG/2ML IJ SOLN: 4.0000 mg | Freq: Four times a day (QID) | INTRAMUSCULAR | Status: DC | PRN

## 2013-07-19 MED ORDER — DOPAMINE-DEXTROSE 3.2-5 MG/ML-% IV SOLN: 3.0000 ug/kg/min | INTRAVENOUS | Status: DC

## 2013-07-19 MED ORDER — GLYCOPYRROLATE 0.2 MG/ML IJ SOLN
INTRAMUSCULAR | Status: AC
  Filled 2013-07-19: qty 2

## 2013-07-19 MED ORDER — LIDOCAINE HCL (PF) 1 % IJ SOLN
INTRAMUSCULAR | Status: AC
  Filled 2013-07-19: qty 30

## 2013-07-19 MED ORDER — HYDRALAZINE HCL 20 MG/ML IJ SOLN: 10.0000 mg | INTRAMUSCULAR | Status: DC | PRN

## 2013-07-19 MED ORDER — POTASSIUM CHLORIDE CRYS ER 20 MEQ PO TBCR: 20.0000 meq | EXTENDED_RELEASE_TABLET | Freq: Every day | ORAL | Status: DC | PRN

## 2013-07-19 MED ORDER — OXYCODONE HCL 5 MG/5ML PO SOLN: 5.0000 mg | Freq: Once | ORAL | Status: DC | PRN

## 2013-07-19 MED ORDER — SODIUM CHLORIDE 0.9 % IV SOLN: 500.0000 mL | Freq: Once | INTRAVENOUS | Status: AC | PRN

## 2013-07-19 MED ORDER — LACTATED RINGERS IV SOLN
INTRAVENOUS | Status: DC | PRN
  Administered 2013-07-19: 07:00:00 via INTRAVENOUS

## 2013-07-19 MED ORDER — ROCURONIUM BROMIDE 100 MG/10ML IV SOLN
INTRAVENOUS | Status: DC | PRN
  Administered 2013-07-19: 50 mg via INTRAVENOUS

## 2013-07-19 MED ORDER — MORPHINE SULFATE 2 MG/ML IJ SOLN: 2.0000 mg | INTRAMUSCULAR | Status: DC | PRN

## 2013-07-19 MED ORDER — SODIUM CHLORIDE 0.9 % IV SOLN: INTRAVENOUS | Status: DC

## 2013-07-19 MED ORDER — PROTAMINE SULFATE 10 MG/ML IV SOLN
INTRAVENOUS | Status: AC
  Filled 2013-07-19: qty 5

## 2013-07-19 MED ORDER — LIDOCAINE HCL (CARDIAC) 20 MG/ML IV SOLN
INTRAVENOUS | Status: AC
Start: 2013-07-19 — End: 2013-07-19
  Filled 2013-07-19: qty 5

## 2013-07-19 MED ORDER — THROMBIN 20000 UNITS EX SOLR: CUTANEOUS | Status: DC | PRN

## 2013-07-19 MED ORDER — ONDANSETRON HCL 4 MG/2ML IJ SOLN
INTRAMUSCULAR | Status: DC | PRN
  Administered 2013-07-19: 4 mg via INTRAVENOUS

## 2013-07-19 MED ORDER — EPHEDRINE SULFATE 50 MG/ML IJ SOLN
INTRAMUSCULAR | Status: DC | PRN
  Administered 2013-07-19 (×2): 10 mg via INTRAVENOUS

## 2013-07-19 MED ORDER — HEPARIN SODIUM (PORCINE) 5000 UNIT/ML IJ SOLN
INTRAMUSCULAR | Status: DC | PRN
  Administered 2013-07-19: 08:00:00

## 2013-07-19 SURGICAL SUPPLY — 53 items
BLADE SURG ROTATE 9660 (MISCELLANEOUS) ×2 IMPLANT
CANISTER SUCTION 2500CC (MISCELLANEOUS) ×3 IMPLANT
CATH ROBINSON RED A/P 18FR (CATHETERS) ×3 IMPLANT
CATH SUCT 10FR WHISTLE TIP (CATHETERS) ×3 IMPLANT
CLIP TI MEDIUM 24 (CLIP) ×3 IMPLANT
CLIP TI WIDE RED SMALL 24 (CLIP) ×3 IMPLANT
COVER SURGICAL LIGHT HANDLE (MISCELLANEOUS) ×3 IMPLANT
CRADLE DONUT ADULT HEAD (MISCELLANEOUS) ×3 IMPLANT
DECANTER SPIKE VIAL GLASS SM (MISCELLANEOUS) IMPLANT
DRAIN HEMOVAC 1/8 X 5 (WOUND CARE) IMPLANT
DRAIN JACKSON PRATT 10MM FLAT (MISCELLANEOUS) ×2 IMPLANT
DRAPE PROXIMA HALF (DRAPES) ×2 IMPLANT
DRAPE WARM FLUID 44X44 (DRAPE) ×3 IMPLANT
DRSG COVADERM 4X6 (GAUZE/BANDAGES/DRESSINGS) ×2 IMPLANT
ELECT REM PT RETURN 9FT ADLT (ELECTROSURGICAL) ×3
ELECTRODE REM PT RTRN 9FT ADLT (ELECTROSURGICAL) ×1 IMPLANT
EVACUATOR SILICONE 100CC (DRAIN) ×2 IMPLANT
GAUZE SPONGE 2X2 8PLY STRL LF (GAUZE/BANDAGES/DRESSINGS) IMPLANT
GLOVE BIO SURGEON STRL SZ 6.5 (GLOVE) ×3 IMPLANT
GLOVE BIO SURGEONS STRL SZ 6.5 (GLOVE) ×3
GLOVE BIOGEL PI IND STRL 6.5 (GLOVE) IMPLANT
GLOVE BIOGEL PI INDICATOR 6.5 (GLOVE) ×4
GLOVE SS BIOGEL STRL SZ 7 (GLOVE) ×1 IMPLANT
GLOVE SUPERSENSE BIOGEL SZ 7 (GLOVE) ×2
GLOVE SURG SS PI 7.0 STRL IVOR (GLOVE) ×2 IMPLANT
GOWN STRL REUS W/ TWL LRG LVL3 (GOWN DISPOSABLE) ×3 IMPLANT
GOWN STRL REUS W/ TWL XL LVL3 (GOWN DISPOSABLE) IMPLANT
GOWN STRL REUS W/TWL LRG LVL3 (GOWN DISPOSABLE) ×12
GOWN STRL REUS W/TWL XL LVL3 (GOWN DISPOSABLE) ×3
INSERT FOGARTY SM (MISCELLANEOUS) ×3 IMPLANT
KIT BASIN OR (CUSTOM PROCEDURE TRAY) ×3 IMPLANT
KIT ROOM TURNOVER OR (KITS) ×3 IMPLANT
NEEDLE 22X1 1/2 (OR ONLY) (NEEDLE) IMPLANT
NS IRRIG 1000ML POUR BTL (IV SOLUTION) ×6 IMPLANT
PACK CAROTID (CUSTOM PROCEDURE TRAY) ×3 IMPLANT
PAD ARMBOARD 7.5X6 YLW CONV (MISCELLANEOUS) ×6 IMPLANT
PATCH HEMASHIELD 8X75 (Vascular Products) ×2 IMPLANT
SHUNT CAROTID BYPASS 12FRX15.5 (VASCULAR PRODUCTS) IMPLANT
SPONGE GAUZE 2X2 STER 10/PKG (GAUZE/BANDAGES/DRESSINGS) ×2
SUT ETHILON 3 0 PS 1 (SUTURE) ×2 IMPLANT
SUT PROLENE 6 0 C 1 30 (SUTURE) ×2 IMPLANT
SUT PROLENE 6 0 CC (SUTURE) ×5 IMPLANT
SUT SILK 2 0 FS (SUTURE) ×3 IMPLANT
SUT SILK 3 0 TIES 17X18 (SUTURE)
SUT SILK 3-0 18XBRD TIE BLK (SUTURE) IMPLANT
SUT VIC AB 2-0 CT1 27 (SUTURE) ×3
SUT VIC AB 2-0 CT1 TAPERPNT 27 (SUTURE) ×1 IMPLANT
SUT VIC AB 3-0 X1 27 (SUTURE) ×3 IMPLANT
SYR CONTROL 10ML LL (SYRINGE) IMPLANT
TAPE CLOTH SURG 4X10 WHT LF (GAUZE/BANDAGES/DRESSINGS) ×2 IMPLANT
TOWEL OR 17X24 6PK STRL BLUE (TOWEL DISPOSABLE) ×3 IMPLANT
TOWEL OR 17X26 10 PK STRL BLUE (TOWEL DISPOSABLE) ×3 IMPLANT
WATER STERILE IRR 1000ML POUR (IV SOLUTION) ×3 IMPLANT

## 2013-07-19 NOTE — Preoperative (Signed)
Beta Blockers   Reason not to administer Beta Blockers:Not Applicable 

## 2013-07-19 NOTE — Interval H&P Note (Signed)
History and Physical Interval Note:  07/19/2013 7:19 AM  [name] [name]  has presented today for surgery, with the diagnosis of Carotid stenosis  The various methods of treatment have been discussed with the patient and family. After consideration of risks, benefits and other options for treatment, the patient has consented to  Procedure(s): ENDARTERECTOMY CAROTID (Left) as a surgical intervention .  The patient's history has been reviewed, patient examined, no change in status, stable for surgery.  I have reviewed the patient's chart and labs.  Questions were answered to the patient's satisfaction.     Tinnie Gens

## 2013-07-19 NOTE — Transfer of Care (Signed)
Immediate Anesthesia Transfer of Care Note  Patient: [name] [name]  Procedure(s) Performed: Procedure(s): ENDARTERECTOMY CAROTID (Left)  Patient Location: PACU  Anesthesia Type:General  Level of Consciousness: awake, alert  and oriented  Airway & Oxygen Therapy: Patient Spontanous Breathing and Patient connected to nasal cannula oxygen  Post-op Assessment: Report given to PACU RN and Post -op Vital signs reviewed and stable  Post vital signs: Reviewed and stable  Complications: No apparent anesthesia complications

## 2013-07-19 NOTE — Progress Notes (Signed)
Report given to diane rn as caregiver

## 2013-07-19 NOTE — Anesthesia Preprocedure Evaluation (Addendum)
Anesthesia Evaluation  Patient identified by MRN, date of birth, ID band Patient awake    Reviewed: Allergy & Precautions, H&P , NPO status , Patient's Chart, lab work & pertinent test results, reviewed documented beta blocker date and time   History of Anesthesia Complications Negative for: history of anesthetic complications  Airway Mallampati: II TM Distance: >3 FB Neck ROM: Full    Dental  (+) Edentulous Upper, Edentulous Lower and Dental Advisory Given   Pulmonary COPDformer smoker,    Pulmonary exam normal       Cardiovascular hypertension, Pt. on medications and Pt. on home beta blockers - angina+ CAD, + Past MI, + CABG and + Peripheral Vascular Disease Rhythm:Regular Rate:Normal     Neuro/Psych  Headaches, TIACVA, No Residual Symptoms negative psych ROS   GI/Hepatic Neg liver ROS, GERD-  Medicated and Controlled,  Endo/Other  Hypothyroidism Glucose intolerance  Renal/GU negative Renal ROS  negative genitourinary   Musculoskeletal negative musculoskeletal ROS (+)   Abdominal   Peds  Hematology On PLavix   Anesthesia Other Findings   Reproductive/Obstetrics                         Anesthesia Physical Anesthesia Plan  ASA: III  Anesthesia Plan: General   Post-op Pain Management:    Induction: Intravenous  Airway Management Planned: Oral ETT  Additional Equipment: Arterial line  Intra-op Plan:   Post-operative Plan: Extubation in OR  Informed Consent: I have reviewed the patients History and Physical, chart, labs and discussed the procedure including the risks, benefits and alternatives for the proposed anesthesia with the patient or authorized representative who has indicated his/her understanding and acceptance.   Dental advisory given  Plan Discussed with: CRNA and Surgeon  Anesthesia Plan Comments:         Anesthesia Quick Evaluation

## 2013-07-19 NOTE — Progress Notes (Signed)
Utilization review completed.  

## 2013-07-19 NOTE — H&P (View-Only) (Signed)
Subjective:     Patient ID: [name] [name], male   DOB: 1941/02/02, 73 y.o.   MRN: [id number]  HPI this 73 year old male was referred by Dr. Frances Nickels for evaluation of left carotid endarterectomy. Patient is well-known to me. I performed a right carotid endarterectomy on him in 2005 and also placed left subclavian stents in 2007. He has had coronary artery bypass grafting in 1995 and had abdominal aortic aneurysm and bilateral renal grafts at [facility] in 1994. Patient currently denies any neurologic symptoms such as aphasia, procedures, lateralizing weakness, syncope, diplopia, blurred vision. He was found to have a greater than 90% left ICA stenosis and was referred for evaluation.  Past Medical History  Diagnosis Date  . HYPOTHYROIDISM   . Pure hypercholesterolemia   . HYPERTENSION   . GERD   . DIVERTICULOSIS OF COLON   . JOINT EFFUSION, KNEE   . OSTEOPOROSIS   . Other dysphagia   . NEPHROLITHIASIS, HX OF   . Myocardial infarction     1994   . CORONARY ARTERY DISEASE   . Atrial fibrillation     no hx of reported at preop visit of 04/21/11   . PERIPHERAL VASCULAR DISEASE     AAA - 1994 with reimplant of renals   . Peripheral vascular disease     subclavian stenosis PTA - 3/08   . CHRONIC OBSTRUCTIVE PULMONARY DISEASE     pt denied at visit of 04/21/11   . Chronic kidney disease     AAA repair with reimplant of renals   . KNEE, ARTHRITIS, DEGEN./OSTEO     right knee   . Renal artery stenosis   . CAD (coronary artery disease)   . Carotid artery occlusion     left carotid endarterectomy    History  Substance Use Topics  . Smoking status: Former Smoker    Types: Cigarettes    Quit date: 06/20/1992  . Smokeless tobacco: Never Used  . Alcohol Use: No    Family History  Problem Relation Age of Onset  . Hypertension Mother   . Heart disease Father     Allergies  Allergen Reactions  . Imitrex [Sumatriptan]   . Lipitor [Atorvastatin]     Current outpatient  prescriptions:aspirin 325 MG tablet, Take 325 mg by mouth daily., Disp: , Rfl: ;  clopidogrel (PLAVIX) 75 MG tablet, TAKE ONE (1) TABLET BY MOUTH EVERY DAY, Disp: 30 tablet, Rfl: 3;  Coenzyme Q10 (CO Q 10 PO), Take 1 tablet by mouth daily., Disp: , Rfl: ;  CRESTOR 20 MG tablet, TAKE ONE (1) TABLET BY MOUTH EVERY DAY, Disp: 30 tablet, Rfl: 3 glucose blood test strip, 1 each by Other route as needed for other. Use as instructed, Disp: , Rfl: ;  KRILL OIL PO, Take 1 tablet by mouth daily., Disp: , Rfl: ;  Lancets MISC, by Does not apply route., Disp: , Rfl: ;  lansoprazole (PREVACID) 15 MG capsule, Take 15 mg by mouth daily. , Disp: , Rfl: ;  levothyroxine (SYNTHROID, LEVOTHROID) 125 MCG tablet, TAKE ONE (1) TABLET EACH DAY, Disp: 30 tablet, Rfl: 1 metoprolol (LOPRESSOR) 50 MG tablet, Take 1 tablet (50 mg total) by mouth 2 (two) times daily., Disp: 60 tablet, Rfl: 3;  niacin 500 MG tablet, 500 mg. Take 3 qd, Disp: , Rfl: ;  nitroGLYCERIN (NITROSTAT) 0.4 MG SL tablet, Place 1 tablet (0.4 mg total) under the tongue every 5 (five) minutes as needed for chest pain., Disp: 25 tablet, Rfl: 10 triamterene-hydrochlorothiazide (  MAXZIDE-25) 37.5-25 MG per tablet, TAKE ONE TABLET EVERY MORNING, Disp: 30 tablet, Rfl: 3  BP 138/75  Pulse 65  Resp 18  Ht 5' 7.5" (1.715 m)  Wt 166 lb (75.297 kg)  BMI 25.60 kg/m2  Body mass index is 25.6 kg/(m^2).           Review of Systems denies chest pain, dyspnea on exertion, PND, orthopnea, claudication-able to ambulate 2-3 miles. All other systems are negative and a complete review of systems    Objective:   Physical Exam BP 138/75  Pulse 65  Resp 18  Ht 5' 7.5" (1.715 m)  Wt 166 lb (75.297 kg)  BMI 25.60 kg/m2  Gen.-alert and oriented x3 in no apparent distress HEENT normal for age Lungs no rhonchi or wheezing Cardiovascular regular rhythm no murmurs carotid pulses 3+-palpable-bilateral harsh carotid bruits left greater than right Abdomen soft nontender no  palpable masses Musculoskeletal free of  major deformities Skin clear -no rashes Neurologic normal Lower extremities 3+ femoral and posterior tibial pulses with 2+ dorsalis pedis pulses palpable. Both feet warm and well perfused with no edema.  Today I reviewed the carotid duplex exam performed at [facility] and this reveals a 90+ percent left ICA stenosis a widely patent right carotid endarterectomy site. Patient does have innominate disease. Both subclavian arteries are patent.        Assessment:     95% left ICA stenosis-asymptomatic-status post right carotid endarterectomy in 2005 Coronary artery disease-stable and asymptomatic with coronary artery bypass grafting 1995    Plan:     Plan left carotid endarterectomy on Friday, January 30-risks and benefits thoroughly discussed with patient and he would like to proceed  Will hold Plavix after today's dose and resume on Saturday, January 31 and continue on daily aspirin preop

## 2013-07-19 NOTE — Anesthesia Postprocedure Evaluation (Signed)
  Anesthesia Post-op Note  Patient: [name] [name]  Procedure(s) Performed: Procedure(s): ENDARTERECTOMY CAROTID (Left)  Patient Location: PACU  Anesthesia Type:General  Level of Consciousness: awake, alert  and oriented  Airway and Oxygen Therapy: Patient Spontanous Breathing  Post-op Pain: none  Post-op Assessment: Post-op Vital signs reviewed, Patient's Cardiovascular Status Stable, Respiratory Function Stable, Patent Airway, No signs of Nausea or vomiting and Pain level controlled  Post-op Vital Signs: Reviewed and stable  Complications: No apparent anesthesia complications

## 2013-07-19 NOTE — Op Note (Signed)
OPERATIVE REPORT  Date of Surgery: 07/19/2013  Surgeon: Tinnie Gens, MD  Assistant: Dionicio Stall  Pre-op Diagnosis: Left Carotid Artery Stenosis-severe-asymptomatic  Post-op Diagnosis: Same  Procedure: Procedure(s): Left carotid endarterectomy with Dacron patch angioplasty  Anesthesia: Gen.  EBL: Minimal  Complications: None  Procedure Details: The patient was taken to the operating room placed in the supine position at which time satisfactory general endotracheal anesthesia was administered. The left neck was prepped with Betadine scrub and solution draped in a routine sterile manner. Incision was made along the anterior border of the sternocleidomastoid muscle carried down through subcutaneous tissue and platysma using the Bovie. Common facial vein was ligated with 2-0 silk ties divided exposing the common internal and external carotid arteries. Care was taken not to injure the vagus or hypoglossal nerves both of which were exposed. There was diffuse disease throughout the carotid system the common carotid was disease down toward the aortic arch but there was a skip area in the mid common carotid artery it was heavily calcified proximal to this point. The bifurcation was heavily calcified but the plaque did terminate in the internal carotid about 4 cm distally. #10 shunt was prepared the patient was heparinized. Carotid vessels were occluded with vascular clamps longitudinal opening made in the common carotid 15 blade extended with Potts scissors. A #10 shunt was inserted without difficulty reestablishing flow in about 2 minutes. There was excellent pulsatile flow coming from the distal internal carotid. The plaque was severely degenerative in nature and about 90-95% stenotic distal vessel appeared normal. A standard endarterectomy was then performed using the elevator and the Potts scissors with eversion endarterectomy of the external carotid. The plaque feathered off the distal internal  carotid artery nicely not requiring any tacking sutures. The lumen was thoroughly irrigated heparin saline all loose debris carefully removed the arthrotomy was closed patched using continuous 6-0 Prolene. Prior to completion of closure the shunt was removed after about 40 minutes the shunt time following antegrade and retrograde flushing closure was completed the reestablishing flow initially at the external and internal branch. There was excellent Doppler flow in the external and internal carotid distally at the conclusion of the procedure. Protamine was then given to reverse the heparin following adequate hemostasis was irrigated with saline. A Jackson-Pratt drain was brought out through an inferiorly based stab wound and secured with a nylon suture. The patient did have more oozing than usual because of Plavix which was known preoperatively. Adequate hemostasis was achieved however wasn't closed layers with Vicryl subcuticular fashion sterile dressing applied patient taken to recovery in satisfactory condition   Tinnie Gens, MD 07/19/2013 9:46 AM

## 2013-07-19 NOTE — Discharge Instructions (Signed)
. °  Carotid Endarterectomy, Care After Refer to this sheet in the next few weeks. These instructions provide you with information on caring for yourself after your procedure. Your health care provider may also give you more specific instructions. Your treatment has been planned according to current medical practices, but problems sometimes occur. Call your health care provider if you have any problems or questions after your procedure.  WHAT TO EXPECT AFTER THE PROCEDURE You may have some pain or an ache in your neck for up to 2 weeks. This is normal. Recovery time varies depending on your age, condition, general health, and other factors. You will likely be able to return to a normal lifestyle within a few weeks.  HOME CARE INSTRUCTIONS   Take showers if your health care provider approves. Do not take tub baths or go swimming until your health care provider says it is okay.   Only take over-the-counter or prescription medicines as directed by your health care provider. If a blood thinner (anticoagulant) is prescribed after surgery, take this medicine exactly as directed.   Change bandages (dressings) as directed by your health care provider.   Avoid heavy lifting or strenuous activity until your health care provider says it is okay. Resume your normal activities as directed.   Stop smoking if you smoke. This is a risk factor for poor wound healing.   Stop taking the pill (oral contraceptives) unless your health care provider recommends otherwise.   Maintain good control of your blood pressure.   Exercise regularly or as instructed by your health care provider.   Eat a heart-healthy diet. Talk to your health care provider about how to lower blood lipids (cholesterol and triglycerides).   Follow up with your health care provider as directed. Make an appointment for the removal of stitches (sutures) or staples. SEEK MEDICAL CARE IF:   You have increased bleeding from the incision  site.   You notice redness, swelling, or increasing pain at the incision site.   You notice swelling in your neck or have difficulty breathing or talking.   You notice a bad smell or pus coming from the incision site or dressing.   You have an oral temperature above 101F (38.3C).   You develop a rash.   You develop any reaction or side effects to medicine given.  SEEK IMMEDIATE MEDICAL CARE IF:   Your initial symptoms are getting worse rather than better.   You develop any abnormal bruising or bleeding.   You have difficulty breathing.   You develop chest pain, shortness of breath, or pain or swelling in your legs.   You have a return of symptoms or problems that caused you to have this surgery.   You develop a temporary loss of vision.   You develop temporary numbness on one side.   You develop a temporary inability to speak (aphasia).   You develop temporary weakness.  MAKE SURE YOU:   Understand these instructions.  Will watch your condition.  Will get help right away if you are not doing well or get worse. Document Released: 12/24/2004 Document Revised: 02/06/2013 Document Reviewed: 11/07/2012 [facility] Patient Information 2014 [place].

## 2013-07-20 LAB — CBC
HCT: 32.6 % — ABNORMAL LOW (ref 39.0–52.0)
Hemoglobin: 12 g/dL — ABNORMAL LOW (ref 13.0–17.0)
MCH: 32.3 pg (ref 26.0–34.0)
MCHC: 36.8 g/dL — ABNORMAL HIGH (ref 30.0–36.0)
MCV: 87.6 fL (ref 78.0–100.0)
PLATELETS: 123 10*3/uL — AB (ref 150–400)
RBC: 3.72 MIL/uL — AB (ref 4.22–5.81)
RDW: 14.1 % (ref 11.5–15.5)
WBC: 7.8 10*3/uL (ref 4.0–10.5)

## 2013-07-20 LAB — BASIC METABOLIC PANEL
BUN: 20 mg/dL (ref 6–23)
CALCIUM: 8.1 mg/dL — AB (ref 8.4–10.5)
CHLORIDE: 108 meq/L (ref 96–112)
CO2: 22 meq/L (ref 19–32)
Creatinine, Ser: 0.72 mg/dL (ref 0.50–1.35)
GFR calc non Af Amer: 90 mL/min — ABNORMAL LOW (ref >90)
Glucose, Bld: 105 mg/dL — ABNORMAL HIGH (ref 70–99)
Potassium: 3.5 mEq/L — ABNORMAL LOW (ref 3.7–5.3)
Sodium: 143 mEq/L (ref 137–147)

## 2013-07-20 MED ORDER — TAMSULOSIN HCL 0.4 MG PO CAPS
0.4000 mg | ORAL_CAPSULE | Freq: Every day | ORAL | Status: DC
  Administered 2013-07-20: 0.4 mg via ORAL
  Filled 2013-07-20 (×2): qty 1

## 2013-07-20 NOTE — Progress Notes (Signed)
   VASCULAR PROGRESS NOTE  SUBJECTIVE: Only complaint is that he has not been able to void.   PHYSICAL EXAM: Filed Vitals:   07/20/13 0400 07/20/13 0500 07/20/13 0600 07/20/13 0700  BP: 111/37 111/41 122/40 119/39  Pulse: 54 58 51 50  Temp:      TempSrc:      Resp: 18 22 21 19   Height:      Weight:      SpO2: 91% 93% 94% 94%   JP = 10 cc drainage overnight Incision looks fine Neuro intact  LABS: Lab Results  Component Value Date   WBC 7.8 07/20/2013   HGB 12.0* 07/20/2013   HCT 32.6* 07/20/2013   MCV 87.6 07/20/2013   PLT 123* 07/20/2013   Lab Results  Component Value Date   CREATININE 0.72 07/20/2013   Lab Results  Component Value Date   INR 1.00 07/17/2013   Active Problems:   Carotid stenosis  ASSESSMENT AND PLAN:  * 1 Day Post-Op s/p: Left CEA  *  D/C JP  * Home today if able to void. If not, will start Flomax, place Foley and try again in AM.  Gae Gallop Beeper: [phone] 07/20/2013

## 2013-07-20 NOTE — Discharge Summary (Signed)
[facility] Discharge Summary  [name] [name] 1941/04/27 73 y.o. male  [id number]  Admission Date: 07/19/2013  Discharge Date: 07/21/13  Physician: Mal Misty, MD  Admission Diagnosis: Carotid stenosis   HPI:   This is a 73 y.o. male was referred by Dr. Frances Nickels for evaluation of left carotid endarterectomy. Patient is well-known to me. I performed a right carotid endarterectomy on him in 2005 and also placed left subclavian stents in 2007. He has had coronary artery bypass grafting in 1995 and had abdominal aortic aneurysm and bilateral renal grafts at [facility] in 1994. Patient currently denies any neurologic symptoms such as aphasia, procedures, lateralizing weakness, syncope, diplopia, blurred vision. He was found to have a greater than 90% left ICA stenosis and was referred for evaluation.  Hospital Course:  The patient was admitted to the hospital and taken to the operating room on 07/19/2013 and underwent left carotid endarterectomy.  The pt tolerated the procedure well and was transported to the PACU in good condition.   By POD 1, the pt neuro status was in tact.  His only complaint was that he was not able to void.  He was started on Flomax.  His JP drain was removed on POD 1.  By POD 2, he was able to void and was discharge to home.  The remainder of the hospital course consisted of increasing mobilization and increasing intake of solids without difficulty.    Recent Labs  07/17/13 1205 07/20/13 0300  NA 139 143  K 3.7 3.5*  CL 100 108  CO2 27 22  GLUCOSE 110* 105*  BUN 21 20  CALCIUM 9.5 8.1*    Recent Labs  07/17/13 1205 07/20/13 0300  WBC 7.5 7.8  HGB 16.3 12.0*  HCT 44.6 32.6*  PLT 157 123*    Recent Labs  07/17/13 1205  INR 1.00    Discharge Instructions:   The patient is discharged to home with extensive instructions on wound care and progressive ambulation.  They are instructed not to drive or perform any heavy  lifting until returning to see the physician in his office.  Discharge Orders   Future Orders Complete By Expires   Activity as tolerated - No restrictions  As directed    Call MD for:  redness, tenderness, or signs of infection (pain, swelling, bleeding, redness, odor or green/yellow discharge around incision site)  As directed    Call MD for:  severe or increased pain, loss or decreased feeling  in affected limb(s)  As directed    Call MD for:  temperature >100.5  As directed    CAROTID Sugery: Call MD for difficulty swallowing or speaking; weakness in arms or legs that is a new symtom; severe headache.  If you have increased swelling in the neck and/or  are having difficulty breathing, CALL 911  As directed    Discharge wound care:  As directed    Comments:     Shower daily with soap and water starting 07/21/13   Driving Restrictions  As directed    Comments:     No driving for 2 weeks   Lifting restrictions  As directed    Comments:     No lifting for 2 weeks   Resume previous diet  As directed       Discharge Diagnosis:  Carotid stenosis  Secondary Diagnosis: Patient Active Problem List   Diagnosis Date Noted  . Carotid stenosis 07/19/2013  . Occlusion and stenosis of carotid  artery without mention of cerebral infarction 07/16/2013  . Impaired fasting glucose 07/14/2013  . Knee pain 05/23/2011  . Stiffness of joint, not elsewhere classified, lower leg 05/23/2011  . Muscle weakness (generalized) 05/23/2011  . Difficulty in walking 05/23/2011  . Bruit 11/19/2010  . AAA (abdominal aortic aneurysm) 11/19/2010  . OTHER DYSPHAGIA 04/27/2010  . ATRIAL FIBRILLATION 12/10/2008  . JOINT EFFUSION, KNEE 01/14/2008  . KNEE PAIN 01/14/2008  . KNEE, ARTHRITIS, DEGEN./OSTEO 12/11/2007  . HYPOTHYROIDISM 10/10/2007  . Pure hypercholesterolemia 10/10/2007  . HYPERTENSION 10/10/2007  . CORONARY ARTERY DISEASE 10/10/2007  . PERIPHERAL VASCULAR DISEASE 10/10/2007  . CHRONIC OBSTRUCTIVE  PULMONARY DISEASE 10/10/2007  . GERD 10/10/2007  . DIVERTICULOSIS OF COLON 10/10/2007  . OSTEOPOROSIS 10/10/2007  . NEPHROLITHIASIS, HX OF 10/10/2007  . Hx-Major Cardiovasc Surg 10/10/2007  . CHOLECYSTECTOMY, HX OF 10/10/2007  . CORONARY ARTERY BYPASS GRAFT, HX OF 10/10/2007   Past Medical History  Diagnosis Date  . HYPOTHYROIDISM   . Pure hypercholesterolemia   . HYPERTENSION   . GERD   . DIVERTICULOSIS OF COLON   . JOINT EFFUSION, KNEE   . OSTEOPOROSIS   . Other dysphagia   . NEPHROLITHIASIS, HX OF   . Myocardial infarction     1994   . CORONARY ARTERY DISEASE   . Atrial fibrillation     no hx of reported at preop visit of 04/21/11   . PERIPHERAL VASCULAR DISEASE     AAA - 1994 with reimplant of renals   . Peripheral vascular disease     subclavian stenosis PTA - 3/08   . CHRONIC OBSTRUCTIVE PULMONARY DISEASE     pt denied at visit of 04/21/11   . Chronic kidney disease     AAA repair with reimplant of renals   . KNEE, ARTHRITIS, DEGEN./OSTEO     right knee   . Renal artery stenosis   . CAD (coronary artery disease)   . Carotid artery occlusion     left carotid endarterectomy  . H/O hiatal hernia   . Headache(784.0)     Hx: of years of years      Medication List         aspirin 325 MG tablet  Take 325 mg by mouth daily.     clopidogrel 75 MG tablet  Commonly known as:  PLAVIX  Take 75 mg by mouth daily with breakfast.     CO Q 10 PO  Take 1 tablet by mouth daily.     KRILL OIL PO  Take 1 tablet by mouth daily.     lansoprazole 15 MG capsule  Commonly known as:  PREVACID  Take 15 mg by mouth daily.     levothyroxine 125 MCG tablet  Commonly known as:  SYNTHROID, LEVOTHROID  Take 125 mcg by mouth daily before breakfast.     metoprolol 50 MG tablet  Commonly known as:  LOPRESSOR  Take 1 tablet (50 mg total) by mouth 2 (two) times daily.     niacin 500 MG tablet  Take 1,500 mg by mouth daily.     nitroGLYCERIN 0.4 MG SL tablet  Commonly known  as:  NITROSTAT  Place 1 tablet (0.4 mg total) under the tongue every 5 (five) minutes as needed for chest pain.     oxyCODONE 5 MG immediate release tablet  Commonly known as:  ROXICODONE  Take 1 tablet (5 mg total) by mouth every 6 (six) hours as needed for severe pain.     rosuvastatin 20 MG  tablet  Commonly known as:  CRESTOR  Take 20 mg by mouth daily.     triamterene-hydrochlorothiazide 37.5-25 MG per tablet  Commonly known as:  MAXZIDE-25  Take 1 tablet by mouth daily.        Roxicodone #30 No Refill  Disposition: home  Patient's condition: is Good  Follow up: 1. Dr.  Scot Dock  in 2 weeks.   Leontine Locket, PA-C [facility] [phone]  --- For [facility] use --- Instructions: Press F2 to tab through selections.  Delete question if not applicable.   Modified Rankin score at D/C (0-6): 0  IV medication needed for:  1. Hypertension: No 2. Hypotension: No  Post-op Complications: No  1. Post-op CVA or TIA: No  If yes: Event classification (right eye, left eye, right cortical, left cortical, verterobasilar, other): n/a  If yes: Timing of event (intra-op, <6 hrs post-op, >=6 hrs post-op, unknown): n/a  2. CN injury: No  If yes: CN n/a injuried   3. Myocardial infarction: No  If yes: Dx by (EKG or clinical, Troponin): n/a  4.  CHF: No  5.  Dysrhythmia (new): No  6. Wound infection: No  7. Reperfusion symptoms: No  8. Return to OR: No  If yes: return to OR for (bleeding, neurologic, other CEA incision, other): n/a  Discharge medications: Statin use:  Yes If No: [ ]  For Medical reasons, [ ]  Non-compliant, [ ]  Not-indicated ASA use:  Yes  If No: [ ]  For Medical reasons, [ ]  Non-compliant, [ ]  Not-indicated Beta blocker use:  Yes If No: [ ]  For Medical reasons, [ ]  Non-compliant, [ ]  Not-indicated ACE-Inhibitor use:  No If No: [ ]  For Medical reasons, [ ]  Non-compliant, [ ]  Not-indicated P2Y12 Antagonist use: Yes, [ ]  Plavix, [  ] Plasugrel, [ ]  Ticlopinine, [ ]  Ticagrelor, [ ]  Other, [ ]  No for medical reason, [ ]  Non-compliant, [ ]  Not-indicated Anti-coagulant use:  No, [ ]  Warfarin, [ ]  Rivaroxaban, [ ]  Dabigatran, [ ]  Other, [ ]  No for medical reason, [ ]  Non-compliant, [ ]  Not-indicated

## 2013-07-21 LAB — TYPE AND SCREEN
ABO/RH(D): O POS
Antibody Screen: NEGATIVE
UNIT DIVISION: 0
Unit division: 0

## 2013-07-21 MED ORDER — TAMSULOSIN HCL 0.4 MG PO CAPS: 0.4000 mg | ORAL_CAPSULE | Freq: Every day | ORAL | Status: DC

## 2013-07-21 NOTE — Progress Notes (Signed)
   VASCULAR PROGRESS NOTE  SUBJECTIVE: No complaints. Able to void this AM.  PHYSICAL EXAM: Filed Vitals:   07/20/13 2225 07/21/13 0018 07/21/13 0354 07/21/13 0716  BP: 132/36 136/46 147/53   Pulse:  62 67   Temp:  98 F (36.7 C) 97.6 F (36.4 C) 98.5 F (36.9 C)  TempSrc:  Oral Oral Oral  Resp: 28 21 22    Height:      Weight:      SpO2:  98% 99%    Left neck incision looks fine. Neuro intact.  LABS: Lab Results  Component Value Date   WBC 7.8 07/20/2013   HGB 12.0* 07/20/2013   HCT 32.6* 07/20/2013   MCV 87.6 07/20/2013   PLT 123* 07/20/2013   Lab Results  Component Value Date   CREATININE 0.72 07/20/2013   Lab Results  Component Value Date   INR 1.00 07/17/2013   CBG (last 3)  No results found for this basename: GLUCAP,  in the last 72 hours  Active Problems:   Carotid stenosis  ASSESSMENT AND PLAN:  * 2 Days Post-Op s/p: L CEA  *  Doing well. Able to void this AM  * D/C to home.  Gae Gallop Beeper: [phone] 07/21/2013

## 2013-07-21 NOTE — Progress Notes (Signed)
Pt discharged home per MD order. All discharge instructions reviewed and all questions answered. Surgical site clean, dry. and intact.

## 2013-07-22 ENCOUNTER — Encounter (HOSPITAL_COMMUNITY): Payer: Self-pay | Admitting: Vascular Surgery

## 2013-07-22 ENCOUNTER — Telehealth: Payer: Self-pay | Admitting: Vascular Surgery

## 2013-07-22 NOTE — Telephone Encounter (Addendum)
Message copied by Gena Fray on Mon Jul 22, 2013  2:48 PM ------      Message from: Mena Goes      Created: Fri Jul 19, 2013  4:40 PM      Regarding: Schedule                   ----- Message -----         From: Gabriel Earing, PA-C         Sent: 07/19/2013   9:57 AM           To: [facility] Charge Pool            S/p left CEA 07/19/13            F/u with Dr. Kellie Simmering in 2 weeks ------  07/22/13: spoke with patient to schedule follow up of 08/06/13 at 830am, dpm

## 2013-08-05 ENCOUNTER — Encounter: Payer: Self-pay | Admitting: Vascular Surgery

## 2013-08-06 ENCOUNTER — Encounter: Payer: Medicare Other | Admitting: Vascular Surgery

## 2013-08-12 ENCOUNTER — Encounter: Payer: Self-pay | Admitting: Vascular Surgery

## 2013-08-13 ENCOUNTER — Encounter: Payer: Self-pay | Admitting: Vascular Surgery

## 2013-08-13 ENCOUNTER — Ambulatory Visit (INDEPENDENT_AMBULATORY_CARE_PROVIDER_SITE_OTHER): Payer: Self-pay | Admitting: Vascular Surgery

## 2013-08-13 VITALS — BP 126/67 | HR 51 | Resp 16 | Ht 67.5 in | Wt 165.0 lb

## 2013-08-13 DIAGNOSIS — I6529 Occlusion and stenosis of unspecified carotid artery: Secondary | ICD-10-CM

## 2013-08-13 NOTE — Addendum Note (Signed)
Addended by: Dorthula Rue L on: 08/13/2013 10:07 AM   Modules accepted: Orders

## 2013-08-13 NOTE — Progress Notes (Signed)
Subjective:     Patient ID: [name] [name], male   DOB: March 17, 1941, 73 y.o.   MRN: [id number]  HPI this 73 year old male returns 3 weeks post left carotid endarterectomy for a 95% left ICA stenosis which is asymptomatic. He had previously undergone right carotid endarterectomy by me in 2005. He's had no neurologic complications since his surgery. He is swallowing well, has no hoarseness, denies lateralizing weakness or aphasia or lateralizing visual loss. He continues to take daily Plavix which he was on preoperatively for cardiac reasons.   Review of Systems     Objective:   Physical Exam BP 126/67  Pulse 51  Resp 16  Ht 5' 7.5" (1.715 m)  Wt 165 lb (74.844 kg)  BMI 25.45 kg/m2  General well-developed well-nourished male no apparent stress alert and oriented x3 Left neck incision has healed nicely. Excellent carotid pulse with no audible bruits. Neurologic exam normal with the exception of a mild left marginal mandibular nerve paresis     Assessment:     Doing well post left carotid endarterectomy with previous right carotid endarterectomy which is widely patent    Plan:     Return in 6 months with a followup carotid duplex exam Continue daily Plavix Had problems voiding postoperatively and is currently on Flomax-will notify his medical doctor for possible referral to a urologist

## 2013-08-28 ENCOUNTER — Other Ambulatory Visit: Payer: Self-pay | Admitting: Family Medicine

## 2013-10-16 ENCOUNTER — Encounter: Payer: Self-pay | Admitting: Family Medicine

## 2013-10-16 ENCOUNTER — Ambulatory Visit (INDEPENDENT_AMBULATORY_CARE_PROVIDER_SITE_OTHER): Payer: Medicare Other | Admitting: Family Medicine

## 2013-10-16 VITALS — BP 140/80 | Ht 66.5 in | Wt 161.1 lb

## 2013-10-16 DIAGNOSIS — Z0000 Encounter for general adult medical examination without abnormal findings: Secondary | ICD-10-CM | POA: Diagnosis not present

## 2013-10-16 DIAGNOSIS — Z1211 Encounter for screening for malignant neoplasm of colon: Secondary | ICD-10-CM | POA: Diagnosis not present

## 2013-10-16 MED ORDER — TAMSULOSIN HCL 0.4 MG PO CAPS: 0.4000 mg | ORAL_CAPSULE | Freq: Every day | ORAL | Status: DC

## 2013-10-16 NOTE — Progress Notes (Signed)
   Subjective:    Patient ID: [name] [name], male    DOB: Aug 02, 1940, 73 y.o.   MRN: [id number]  HPI AWV- Annual Wellness Visit  The patient was seen for their annual wellness visit. The patient's past medical history, surgical history, and family history were reviewed. Pertinent vaccines were reviewed ( tetanus, pneumonia, shingles, flu) The patient's medication list was reviewed and updated. The height and weight were entered. The patient's current BMI is: 25.62  Cognitive screening was completed. Outcome of Mini - Cog: passed  Falls within the past 6 months: none  Current tobacco usage: non-smoker (All patients who use tobacco were given written and verbal information on quitting)  Recent listing of emergency department/hospitalizations over the past year were reviewed.  current specialist the patient sees on a regular basis:   Medicare annual wellness visit patient questionnaire was reviewed.  A written screening schedule for the patient for the next 5-10 years was given. Appropriate discussion of followup regarding next visit was discussed.   Patient states he has been having bladder issues and wants to discuss a referral to a urologist. Gets up once or so at night to urinate.PSA was normal.  Recent obstructive symtoms   Patient has been having leg cramps also.   Had carotid stenosois, and so had a procedure. Had operation on it.     Review of Systems  Constitutional: Negative for fever, activity change and appetite change.  HENT: Negative for congestion and rhinorrhea.   Eyes: Negative for discharge.  Respiratory: Negative for cough and wheezing.   Cardiovascular: Negative for chest pain.  Gastrointestinal: Negative for vomiting, abdominal pain and blood in stool.  Genitourinary: Negative for frequency and difficulty urinating.       Mild urinary frequency  Musculoskeletal: Negative for neck pain.       Occasional joint pain  Skin: Negative for rash.    Allergic/Immunologic: Negative for environmental allergies and food allergies.  Neurological: Negative for weakness and headaches.  Psychiatric/Behavioral: Negative for agitation.  All other systems reviewed and are negative.      Objective:   Physical Exam  Constitutional: He appears well-developed and well-nourished.  HENT:  Head: Normocephalic and atraumatic.  Right Ear: External ear normal.  Left Ear: External ear normal.  Nose: Nose normal.  Mouth/Throat: Oropharynx is clear and moist.  Eyes: EOM are normal. Pupils are equal, round, and reactive to light.  Neck: Normal range of motion. Neck supple. No thyromegaly present.  Cardiovascular: Normal rate, regular rhythm and normal heart sounds.   No murmur heard. Pulmonary/Chest: Effort normal and breath sounds normal. No respiratory distress. He has no wheezes.  Abdominal: Soft. Bowel sounds are normal. He exhibits no distension and no mass. There is no tenderness.  Some abdominal wall herniation at surgical site noted  Genitourinary: Penis normal.  Musculoskeletal: Normal range of motion. He exhibits no edema.  Lymphadenopathy:    He has no cervical adenopathy.  Neurological: He is alert. He exhibits normal muscle tone.  Skin: Skin is warm and dry. No erythema.  Mild venous stasis changes.  Psychiatric: He has a normal mood and affect. His behavior is normal. Judgment normal.   Prostate moderately enlarged       Assessment & Plan:  Impression 1 wellness exam. Colonoscopy due in 2 years. No blood in stools. Up-to-date on vaccines. #2 urinary frequency secondary to prostate hypertrophy Plan diet exercise discussed. Hemoccult cards rationale discussed. Flomax 0.4 daily. Followup as scheduled. [facility]

## 2013-11-01 ENCOUNTER — Other Ambulatory Visit: Payer: Self-pay | Admitting: Cardiovascular Disease

## 2013-11-18 ENCOUNTER — Other Ambulatory Visit: Payer: Self-pay | Admitting: Cardiovascular Disease

## 2013-11-18 LAB — POC HEMOCCULT BLD/STL (HOME/3-CARD/SCREEN)
Card #3 Fecal Occult Blood, POC: NEGATIVE
FECAL OCCULT BLD: NEGATIVE
Fecal Occult Blood, POC: NEGATIVE

## 2013-11-24 ENCOUNTER — Other Ambulatory Visit: Payer: Self-pay | Admitting: Cardiovascular Disease

## 2013-11-30 ENCOUNTER — Other Ambulatory Visit: Payer: Self-pay | Admitting: Cardiovascular Disease

## 2013-12-02 ENCOUNTER — Other Ambulatory Visit: Payer: Self-pay | Admitting: Cardiovascular Disease

## 2013-12-11 ENCOUNTER — Ambulatory Visit (INDEPENDENT_AMBULATORY_CARE_PROVIDER_SITE_OTHER): Payer: Medicare Other | Admitting: Cardiovascular Disease

## 2013-12-11 VITALS — BP 154/80 | HR 68 | Ht 67.0 in | Wt 165.0 lb

## 2013-12-11 DIAGNOSIS — I714 Abdominal aortic aneurysm, without rupture, unspecified: Secondary | ICD-10-CM

## 2013-12-11 DIAGNOSIS — E7800 Pure hypercholesterolemia, unspecified: Secondary | ICD-10-CM | POA: Diagnosis not present

## 2013-12-11 DIAGNOSIS — I10 Essential (primary) hypertension: Secondary | ICD-10-CM | POA: Diagnosis not present

## 2013-12-11 DIAGNOSIS — I2510 Atherosclerotic heart disease of native coronary artery without angina pectoris: Secondary | ICD-10-CM | POA: Diagnosis not present

## 2013-12-11 DIAGNOSIS — R0989 Other specified symptoms and signs involving the circulatory and respiratory systems: Secondary | ICD-10-CM

## 2013-12-11 NOTE — Assessment & Plan Note (Signed)
Well controlled.  Continue current medications and low sodium Dash type diet.    

## 2013-12-11 NOTE — Assessment & Plan Note (Signed)
Cholesterol is at goal.  Continue current dose of statin and diet Rx.  No myalgias or side effects.  F/U  LFT's in 6 months. Lab Results  Component Value Date   LDLCALC 53 07/09/2013

## 2013-12-11 NOTE — Progress Notes (Signed)
Patient ID: [name] [name], male   DOB: 09-01-1940, 73 y.o.   MRN: [id number] [place] is a vasculopath with history of AAA repair, CABG, left subclavian stent and right CEA. he has seen Dr. Kellie Simmering for surveillance of his AAA. He prefers to have his carotids and subclavian followed up here. I reviewed his from 12/11 Abdominal duplex and his Aoritic and renal grafts are fine  He has not had any significant TIAs her left upper extremity arm weakness. Had a previous CABG. His last catheter was done 310 2008. His LIMA to the LAD was patent with a patent vein graft to the PDA and no significant disease in his circumflex. His LV function was normal. He denies SSCP and has chronic mild exertional dyspnea from lung disease.   Reviewed echo from 6/12 . Has significant SEM. NO AS suprisingly. Normal EF and only mild MR   Had his right knee replaced and has some chronic back pain he needs to F/U with Dr Tonita Cong. Has had injection by [facility]   Duplex 1/15 with high grade LICA stenosis Had CEA with Dr Kellie Simmering 07/19/13    ROS: Denies fever, malais, weight loss, blurry vision, decreased visual acuity, cough, sputum, SOB, hemoptysis, pleuritic pain, palpitaitons, heartburn, abdominal pain, melena, lower extremity edema, claudication, or rash.  All other systems reviewed and negative  General: Affect appropriate Healthy:  appears stated age 73: normal Neck supple with no adenopathy JVP normal bilateral  bruits  With CEA;s no thyromegaly Lungs clear with no wheezing and good diaphragmatic motion Heart:  S1/S2 no murmur, no rub, gallop or click PMI normal Abdomen: benighn, BS positve, no tenderness, no AAA Femoral  bruit.  No HSM or HJR Distal pulses intact with no bruits No edema Neuro non-focal Skin warm and dry No muscular weakness   Current Outpatient Prescriptions  Medication Sig Dispense Refill  . aspirin 325 MG tablet Take 325 mg by mouth daily.      . clopidogrel (PLAVIX) 75 MG tablet TAKE ONE (1)  TABLET BY MOUTH EVERY DAY  30 tablet  0  . Coenzyme Q10 (CO Q 10 PO) Take 1 tablet by mouth daily.      [name] KRILL OIL PO Take 1 tablet by mouth daily.      . lansoprazole (PREVACID) 15 MG capsule Take 15 mg by mouth daily.       [name] levothyroxine (SYNTHROID, LEVOTHROID) 125 MCG tablet TAKE ONE (1) TABLET EACH DAY  30 tablet  5  . metoprolol (LOPRESSOR) 50 MG tablet TAKE ONE TABLET BY MOUTH TWICE A DAY  60 tablet  6  . niacin 500 MG tablet Take 1,500 mg by mouth daily.       . nitroGLYCERIN (NITROSTAT) 0.4 MG SL tablet Place 1 tablet (0.4 mg total) under the tongue every 5 (five) minutes as needed for chest pain.  25 tablet  10  . rosuvastatin (CRESTOR) 20 MG tablet Take 20 mg by mouth daily.      . tamsulosin (FLOMAX) 0.4 MG CAPS capsule Take 1 capsule (0.4 mg total) by mouth daily.  30 capsule  11  . triamterene-hydrochlorothiazide (MAXZIDE-25) 37.5-25 MG per tablet TAKE ONE TABLET EVERY MORNING  30 tablet  0   No current facility-administered medications for this visit.    Allergies  Imitrex and Lipitor  Electrocardiogram:  12/15  SR first degree IVCD latera T wave changes   Assessment and Plan

## 2013-12-11 NOTE — Assessment & Plan Note (Signed)
Stable f/u duplex 6 months

## 2013-12-11 NOTE — Assessment & Plan Note (Signed)
S/P bilateral CEA;s  Most recent 07/19/13 on left  F/U Dr Kellie Simmering

## 2013-12-11 NOTE — Assessment & Plan Note (Signed)
Stable with no angina and good activity level.  Continue medical Rx  

## 2013-12-11 NOTE — Patient Instructions (Signed)
Your physician wants you to follow-up in:    [place]  [facility] [place] will receive a reminder letter in the mail two months in advance. If you don't receive a letter, please call our office to schedule the follow-up appointment. Your physician recommends that you continue on your current medications as directed. Please refer to the Current Medication list given to you today.

## 2013-12-17 ENCOUNTER — Other Ambulatory Visit: Payer: Self-pay | Admitting: Cardiovascular Disease

## 2013-12-25 ENCOUNTER — Other Ambulatory Visit: Payer: Self-pay | Admitting: Cardiovascular Disease

## 2014-01-15 ENCOUNTER — Ambulatory Visit (INDEPENDENT_AMBULATORY_CARE_PROVIDER_SITE_OTHER): Payer: Medicare Other | Admitting: Family Medicine

## 2014-01-15 ENCOUNTER — Encounter: Payer: Self-pay | Admitting: Family Medicine

## 2014-01-15 VITALS — BP 122/82 | Ht 66.5 in | Wt 166.7 lb

## 2014-01-15 DIAGNOSIS — R7301 Impaired fasting glucose: Secondary | ICD-10-CM

## 2014-01-15 DIAGNOSIS — I10 Essential (primary) hypertension: Secondary | ICD-10-CM | POA: Diagnosis not present

## 2014-01-15 DIAGNOSIS — I2510 Atherosclerotic heart disease of native coronary artery without angina pectoris: Secondary | ICD-10-CM | POA: Diagnosis not present

## 2014-01-15 DIAGNOSIS — R7309 Other abnormal glucose: Secondary | ICD-10-CM | POA: Diagnosis not present

## 2014-01-15 DIAGNOSIS — Z0000 Encounter for general adult medical examination without abnormal findings: Secondary | ICD-10-CM

## 2014-01-15 DIAGNOSIS — Z0189 Encounter for other specified special examinations: Secondary | ICD-10-CM

## 2014-01-15 DIAGNOSIS — Z79899 Other long term (current) drug therapy: Secondary | ICD-10-CM | POA: Diagnosis not present

## 2014-01-15 DIAGNOSIS — E782 Mixed hyperlipidemia: Secondary | ICD-10-CM

## 2014-01-15 DIAGNOSIS — R739 Hyperglycemia, unspecified: Secondary | ICD-10-CM

## 2014-01-15 DIAGNOSIS — E7800 Pure hypercholesterolemia, unspecified: Secondary | ICD-10-CM

## 2014-01-15 NOTE — Patient Instructions (Signed)
Even though you have a bit of a challenge with short term memory, as do most folks your age, you passed the MMSE test with flying colors at 30 out of 30. So good news is that though family history of dementia, this test shows none present. We should do this yearly with your family history.

## 2014-01-15 NOTE — Progress Notes (Signed)
   Subjective:    Patient ID: [name] [name], male    DOB: 07/22/1940, 73 y.o.   MRN: [id number]  Hypertension This is a chronic problem. The current episode started more than 1 year ago. Risk factors for coronary artery disease include dyslipidemia and male gender. Treatments tried: metoprolol, triam/hctz. There are no compliance problems.    The family was quite concerned about patient's memory. Patient states he does have some short-term memory difficulties but nothing major. We did a Mini Mental exam- passed. In fact scores 30 out of 30.  Compliant with blood pressure medication. No obvious side effects.  History of elevated sugar. Trying to watch this in the diet.  With known coronary artery disease patient is on lipid medication. Tolerating well. No obvious side effects. Trying to exercise.   Review of Systems No chest pain no headache no back pain no abdominal pain no change in bowel habits no blood in stool ROS otherwise negative    Objective:   Physical Exam  Alert vitals stable. Mental status is noted. Oriented x3 lungs clear. Heart rare rhythm. Ankles without edema.      Assessment & Plan:  Impression 1 hypertension good control. #2 hyperlipidemia status uncertain. #3 glucose intolerance status uncertain. #4 short short-term memory issues stable discussed plan diet exercise discussed. Appropriate blood work. Meds refilled. Followup as scheduled. [facility]SL

## 2014-01-18 DIAGNOSIS — R7309 Other abnormal glucose: Secondary | ICD-10-CM | POA: Diagnosis not present

## 2014-01-18 DIAGNOSIS — E782 Mixed hyperlipidemia: Secondary | ICD-10-CM | POA: Diagnosis not present

## 2014-01-18 DIAGNOSIS — Z79899 Other long term (current) drug therapy: Secondary | ICD-10-CM | POA: Diagnosis not present

## 2014-01-18 LAB — GLUCOSE, RANDOM: GLUCOSE: 113 mg/dL — AB (ref 70–99)

## 2014-01-18 LAB — LIPID PANEL
Cholesterol: 126 mg/dL (ref 0–200)
HDL: 47 mg/dL (ref >39)
LDL CALC: 59 mg/dL (ref 0–99)
TRIGLYCERIDES: 99 mg/dL (ref <150)
Total CHOL/HDL Ratio: 2.7 Ratio
VLDL: 20 mg/dL (ref 0–40)

## 2014-01-18 LAB — HEPATIC FUNCTION PANEL
ALT: 23 U/L (ref 0–53)
AST: 27 U/L (ref 0–37)
Albumin: 4.5 g/dL (ref 3.5–5.2)
Alkaline Phosphatase: 80 U/L (ref 39–117)
BILIRUBIN INDIRECT: 0.5 mg/dL (ref 0.2–1.2)
Bilirubin, Direct: 0.2 mg/dL (ref 0.0–0.3)
Total Bilirubin: 0.7 mg/dL (ref 0.2–1.2)
Total Protein: 6.4 g/dL (ref 6.0–8.3)

## 2014-01-20 ENCOUNTER — Encounter: Payer: Self-pay | Admitting: Family Medicine

## 2014-02-10 ENCOUNTER — Encounter: Payer: Self-pay | Admitting: Family

## 2014-02-11 ENCOUNTER — Ambulatory Visit (HOSPITAL_COMMUNITY)
Admission: RE | Admit: 2014-02-11 | Discharge: 2014-02-11 | Disposition: A | Discharge status: Home / Self Care | Payer: Medicare Other | Source: Ambulatory Visit | Attending: Family | Admitting: Family

## 2014-02-11 ENCOUNTER — Telehealth: Payer: Self-pay | Admitting: *Deleted

## 2014-02-11 ENCOUNTER — Ambulatory Visit (INDEPENDENT_AMBULATORY_CARE_PROVIDER_SITE_OTHER): Payer: Medicare Other | Admitting: Family

## 2014-02-11 ENCOUNTER — Encounter: Payer: Self-pay | Admitting: Family

## 2014-02-11 VITALS — BP 185/67 | HR 53 | Resp 16 | Ht 66.5 in | Wt 168.0 lb

## 2014-02-11 DIAGNOSIS — Z48812 Encounter for surgical aftercare following surgery on the circulatory system: Secondary | ICD-10-CM | POA: Diagnosis not present

## 2014-02-11 DIAGNOSIS — R5381 Other malaise: Secondary | ICD-10-CM

## 2014-02-11 DIAGNOSIS — I6529 Occlusion and stenosis of unspecified carotid artery: Secondary | ICD-10-CM | POA: Insufficient documentation

## 2014-02-11 DIAGNOSIS — R5383 Other fatigue: Secondary | ICD-10-CM | POA: Diagnosis not present

## 2014-02-11 DIAGNOSIS — I2510 Atherosclerotic heart disease of native coronary artery without angina pectoris: Secondary | ICD-10-CM

## 2014-02-11 DIAGNOSIS — R531 Weakness: Secondary | ICD-10-CM | POA: Insufficient documentation

## 2014-02-11 NOTE — Patient Instructions (Signed)
Stroke Prevention Some medical conditions and behaviors are associated with an increased chance of having a stroke. You may prevent a stroke by making healthy choices and managing medical conditions. HOW CAN I REDUCE MY RISK OF HAVING A STROKE?   Stay physically active. Get at least 30 minutes of activity on most or all days.  Do not smoke. It may also be helpful to avoid exposure to secondhand smoke.  Limit alcohol use. Moderate alcohol use is considered to be:  No more than 2 drinks per day for men.  No more than 1 drink per day for nonpregnant women.  Eat healthy foods. This involves:  Eating 5 or more servings of fruits and vegetables a day.  Making dietary changes that address high blood pressure (hypertension), high cholesterol, diabetes, or obesity.  Manage your cholesterol levels.  Making food choices that are high in fiber and low in saturated fat, trans fat, and cholesterol may control cholesterol levels.  Take any prescribed medicines to control cholesterol as directed by your health care provider.  Manage your diabetes.  Controlling your carbohydrate and sugar intake is recommended to manage diabetes.  Take any prescribed medicines to control diabetes as directed by your health care provider.  Control your hypertension.  Making food choices that are low in salt (sodium), saturated fat, trans fat, and cholesterol is recommended to manage hypertension.  Take any prescribed medicines to control hypertension as directed by your health care provider.  Maintain a healthy weight.  Reducing calorie intake and making food choices that are low in sodium, saturated fat, trans fat, and cholesterol are recommended to manage weight.  Stop drug abuse.  Avoid taking birth control pills.  Talk to your health care provider about the risks of taking birth control pills if you are over 35 years old, smoke, get migraines, or have ever had a blood clot.  Get evaluated for sleep  disorders (sleep apnea).  Talk to your health care provider about getting a sleep evaluation if you snore a lot or have excessive sleepiness.  Take medicines only as directed by your health care provider.  For some people, aspirin or blood thinners (anticoagulants) are helpful in reducing the risk of forming abnormal blood clots that can lead to stroke. If you have the irregular heart rhythm of atrial fibrillation, you should be on a blood thinner unless there is a good reason you cannot take them.  Understand all your medicine instructions.  Make sure that other conditions (such as anemia or atherosclerosis) are addressed. SEEK IMMEDIATE MEDICAL CARE IF:   You have sudden weakness or numbness of the face, arm, or leg, especially on one side of the body.  Your face or eyelid droops to one side.  You have sudden confusion.  You have trouble speaking (aphasia) or understanding.  You have sudden trouble seeing in one or both eyes.  You have sudden trouble walking.  You have dizziness.  You have a loss of balance or coordination.  You have a sudden, severe headache with no known cause.  You have new chest pain or an irregular heartbeat. Any of these symptoms may represent a serious problem that is an emergency. Do not wait to see if the symptoms will go away. Get medical help at once. Call your local emergency services (911 in [place] Do not drive yourself to the hospital. Document Released: 07/14/2004 Document Revised: 10/21/2013 Document Reviewed: 12/07/2012 [employer] Patient Information 2015 [employer]. This information is not intended to replace advice given   to you by your health care provider. Make sure you discuss any questions you have with your health care provider.  

## 2014-02-11 NOTE — Progress Notes (Signed)
Established Carotid Patient   History of Present Illness  [name] [name] is a 73 y.o. male patient of Dr. Kellie Simmering who is s/p left carotid endarterectomy in February, 2015 for a 95% left ICA stenosis which was asymptomatic. He returns today for 6 months follow up. He had previously undergone right carotid endarterectomy in 2005. He's had no neurologic complications since his surgery.   He continues to take daily Plavix which he was on preoperatively for cardiac reasons. He exercises 5 days/week, for 60-90 minutes. Pt states his blood pressure is usually 120/70, but high in a Dr. Gabriel Carina.  He denies any remote or recent history of stroke or TIA symptoms, denies tingling, numbness, or aching in arms or hands. He specifically denies amaurosis fugax or monocular blindness.  The patient  denies facial drooping.  Pt. denies hemiplegia.  The patient denies receptive or expressive aphasia.   He has known right sciatic nerve issues for which he has received an injection in his spine which helped. He has a ventral hernia.  Pt  denies New Medical or Surgical History.  Pt Diabetic: No Pt smoker: former smoker, quit in 1994  Pt meds include: Statin : Yes ASA: Yes Other anticoagulants/antiplatelets: Plavix   Past Medical History  Diagnosis Date  . HYPOTHYROIDISM   . Pure hypercholesterolemia   . HYPERTENSION   . GERD   . DIVERTICULOSIS OF COLON   . JOINT EFFUSION, KNEE   . OSTEOPOROSIS   . Other dysphagia   . NEPHROLITHIASIS, HX OF   . Myocardial infarction     1994   . CORONARY ARTERY DISEASE   . Atrial fibrillation     no hx of reported at preop visit of 04/21/11   . PERIPHERAL VASCULAR DISEASE     AAA - 1994 with reimplant of renals   . Peripheral vascular disease     subclavian stenosis PTA - 3/08   . CHRONIC OBSTRUCTIVE PULMONARY DISEASE     pt denied at visit of 04/21/11   . Chronic kidney disease     AAA repair with reimplant of renals   . KNEE, ARTHRITIS, DEGEN./OSTEO      right knee   . Renal artery stenosis   . CAD (coronary artery disease)   . Carotid artery occlusion     left carotid endarterectomy  . H/O hiatal hernia   . Headache(784.0)     Hx: of years of years    Social History History  Substance Use Topics  . Smoking status: Former Smoker    Types: Cigarettes    Quit date: 06/20/1992  . Smokeless tobacco: Never Used  . Alcohol Use: No    Family History Family History  Problem Relation Age of Onset  . Hypertension Mother   . Heart disease Father     Surgical History Past Surgical History  Procedure Laterality Date  . Abdominal aortic aneurysm repair      with reimplantation of renals   . Gallbladder surgery  2004   . Knee surgery    . Vascular surgery      AAA  . Cardiac catheterization      2008  . Coronary artery bypass graft      1995  . Joint replacement      partial knee replacement on left 2002   . Other surgical history      left subclavian stenosis surgery PTA 08/2006   . Other surgical history      carotid surgery on right 2004   .  Total knee arthroplasty  04/29/2011    Procedure: TOTAL KNEE ARTHROPLASTY;  Surgeon: Gearlean Alf;  Location: [facility];  Service: Orthopedics;  Laterality: Right;  . Vasectomy  1973  . Cholecystectomy    . Colonoscopy w/ biopsies and polypectomy      Hx: of  . Coronary stent placement      Hx: of  . Endarterectomy Left 07/19/2013    Procedure: ENDARTERECTOMY CAROTID;  Surgeon: Mal Misty, MD;  Location: [facility];  Service: Vascular;  Laterality: Left;    Allergies  Allergen Reactions  . Imitrex [Sumatriptan] Other (See Comments)    "made me like I was having a stoke"  . Lipitor [Atorvastatin] Other (See Comments)    "couldn't get out of the bed I hurt so bad"    Current Outpatient Prescriptions  Medication Sig Dispense Refill  . aspirin 325 MG tablet Take 325 mg by mouth daily.      . clopidogrel (PLAVIX) 75 MG tablet TAKE ONE (1) TABLET EACH DAY  30 tablet  5  . Coenzyme  Q10 (CO Q 10 PO) Take 1 tablet by mouth daily.      [name] KRILL OIL PO Take 1 tablet by mouth daily.      . lansoprazole (PREVACID) 15 MG capsule Take 15 mg by mouth daily.       [name] levothyroxine (SYNTHROID, LEVOTHROID) 125 MCG tablet TAKE ONE (1) TABLET EACH DAY  30 tablet  5  . metoprolol (LOPRESSOR) 50 MG tablet TAKE ONE TABLET BY MOUTH TWICE A DAY  60 tablet  6  . niacin 500 MG tablet Take 1,500 mg by mouth daily.       . nitroGLYCERIN (NITROSTAT) 0.4 MG SL tablet Place 1 tablet (0.4 mg total) under the tongue every 5 (five) minutes as needed for chest pain.  25 tablet  10  . rosuvastatin (CRESTOR) 20 MG tablet Take 20 mg by mouth daily.      . tamsulosin (FLOMAX) 0.4 MG CAPS capsule Take 1 capsule (0.4 mg total) by mouth daily.  30 capsule  11  . triamterene-hydrochlorothiazide (MAXZIDE-25) 37.5-25 MG per tablet TAKE ONE TABLET EVERY MORNING  30 tablet  5   No current facility-administered medications for this visit.    Review of Systems : See HPI for pertinent positives and negatives.  Physical Examination Filed Vitals:   02/11/14 1224 02/11/14 1227  BP: 185/67 185/67  Pulse: 53 53  Resp:  16  Height:  5' 6.5" (1.689 m)  Weight:  168 lb (76.204 kg)  SpO2:  100%   Body mass index is 26.71 kg/(m^2).   General: WDWN male in NAD GAIT: normal Eyes: PERRLA Pulmonary:  Non-labored, CTAB, Negative  Rales, Negative rhonchi, & Negative wheezing.  Cardiac: regular Rhythm ,  Positive detected murmur.  VASCULAR EXAM Carotid Bruits Right Left   Positive, harsh Positive    Aorta is not palpable. Radial pulses are 2+ palpable and equal.  LE Pulses Right Left       POPLITEAL  not palpable   not palpable       POSTERIOR TIBIAL   palpable    palpable        DORSALIS PEDIS      ANTERIOR TIBIAL not palpable  not palpable     Gastrointestinal: soft, nontender, BS  WNL, no r/g,  Ventral hernia.  Musculoskeletal: Negative muscle atrophy/wasting. M/S 5/5 throughout, Extremities without ischemic changes  Neurologic: A&O X 3; Appropriate Affect ; SENSATION ;normal;  Speech is normal CN 2-12 intact except is moderately hard of hearing, Pain and light touch intact in extremities, Motor exam as listed above.   Non-Invasive Vascular Imaging CAROTID DUPLEX 02/11/2014   CEREBROVASCULAR DUPLEX EVALUATION    INDICATION: Carotid artery disease    PREVIOUS INTERVENTION(S): Left carotid endarterectomy 07/19/2013; Redo left subclavian artery stent 04/05/2006; Right carotid endarterectomy 05/24/2004    DUPLEX EXAM: Carotid duplex    RIGHT  LEFT  Peak Systolic Velocities (cm/s) End Diastolic Velocities (cm/s) Plaque LOCATION Peak Systolic Velocities (cm/s) End Diastolic Velocities (cm/s) Plaque  487 42 HT CCA PROXIMAL 219 27 HT  90 11 HT CCA MID 228 28 HT  161 24 HT CCA DISTAL 170 23 -  130 13 - ECA 210 13 -  84 15 HT ICA PROXIMAL 120 19 HM  71 19 - ICA MID 182 28 curve -  64 13 - ICA DISTAL 144 18 -    [name] ICA / CCA Ratio (PSV) [name]  AntegrAntegradeade Vertebral Flow Antegrade  767 Brachial Systolic Pressure (mmHg) 209  Triphasic Brachial Artery Waveforms Triphasic    Plaque Morphology:  HM = Homogeneous, HT = Heterogeneous, CP = Calcific Plaque, SP = Smooth Plaque, IP = Irregular Plaque     ADDITIONAL FINDINGS: Diffuse, irregular  plaque in the proximal right CCA / subclavian artery (427 cms) with significant plaque in the distal CCA with increased velocity noted.    IMPRESSION: 1. Less than 40% right internal carotid artery stenosis with patent endarterectomy site. 2. Less than 40% left internal carotid artery stenosis, slight increase of velocity mid ICA curve, status post carotid endarterectomy 3. Evidence of significant proximal right CCA stenosis.  4. The left SCA stent difficult to visualize.    Compared to the previous exam:  No change.     Assessment: ARIQ KHAMIS is a 73 y.o. male who is s/p left carotid endarterectomy in February, 2015 for a 95% left ICA stenosis which was asymptomatic. Carotid Duplex today demonstrates Less than 40% right internal carotid artery stenosis with patent endarterectomy site. Less than 40% left internal carotid artery stenosis, slight increase of velocity mid ICA curve, status post carotid endarterectomy Evidence of significant proximal right CCA stenosis.  The left SCA stent difficult to visualize. No change from previous Duplex.  The carotid Duplex results were discussed with Dr. Kellie Simmering.  Plan: Follow-up in 6 months with Carotid Duplex scan, and see Dr. Kellie Simmering.   I discussed in depth with the patient the nature of atherosclerosis, and emphasized the importance of maximal medical management including strict control of blood pressure, blood glucose, and lipid levels, obtaining regular exercise, and continued cessation of smoking.  The patient is aware that without maximal medical management the underlying atherosclerotic disease process will progress, limiting the benefit of any interventions. The patient was given information about stroke prevention and what symptoms should prompt the patient to seek immediate medical care. Thank you for allowing [place] to participate in  this patient's care.  Clemon Chambers, RN, MSN, FNP-C [facility] Office: [phone]  Clinic Physician: Kellie Simmering  02/11/2014 9:56 AM

## 2014-02-11 NOTE — Telephone Encounter (Signed)
Crestor samples placed at the front desk for pick up.

## 2014-02-25 ENCOUNTER — Other Ambulatory Visit: Payer: Self-pay | Admitting: Family Medicine

## 2014-03-19 ENCOUNTER — Other Ambulatory Visit: Payer: Self-pay | Admitting: Cardiovascular Disease

## 2014-06-12 ENCOUNTER — Other Ambulatory Visit: Payer: Self-pay | Admitting: Cardiovascular Disease

## 2014-06-25 ENCOUNTER — Other Ambulatory Visit (HOSPITAL_COMMUNITY): Payer: Self-pay | Admitting: Cardiology

## 2014-06-25 ENCOUNTER — Encounter: Payer: Self-pay | Admitting: Family Medicine

## 2014-06-25 ENCOUNTER — Ambulatory Visit (INDEPENDENT_AMBULATORY_CARE_PROVIDER_SITE_OTHER): Payer: Medicare Other | Admitting: Family Medicine

## 2014-06-25 VITALS — BP 120/80 | Ht 65.5 in | Wt 173.2 lb

## 2014-06-25 DIAGNOSIS — Z79899 Other long term (current) drug therapy: Secondary | ICD-10-CM | POA: Diagnosis not present

## 2014-06-25 DIAGNOSIS — E038 Other specified hypothyroidism: Secondary | ICD-10-CM | POA: Diagnosis not present

## 2014-06-25 DIAGNOSIS — I6523 Occlusion and stenosis of bilateral carotid arteries: Secondary | ICD-10-CM

## 2014-06-25 DIAGNOSIS — Z125 Encounter for screening for malignant neoplasm of prostate: Secondary | ICD-10-CM | POA: Diagnosis not present

## 2014-06-25 DIAGNOSIS — I700 Atherosclerosis of aorta: Secondary | ICD-10-CM

## 2014-06-25 DIAGNOSIS — R7301 Impaired fasting glucose: Secondary | ICD-10-CM | POA: Diagnosis not present

## 2014-06-25 DIAGNOSIS — I10 Essential (primary) hypertension: Secondary | ICD-10-CM | POA: Diagnosis not present

## 2014-06-25 DIAGNOSIS — I708 Atherosclerosis of other arteries: Principal | ICD-10-CM

## 2014-06-25 DIAGNOSIS — E785 Hyperlipidemia, unspecified: Secondary | ICD-10-CM | POA: Diagnosis not present

## 2014-06-25 MED ORDER — LEVOTHYROXINE SODIUM 125 MCG PO TABS: ORAL_TABLET | ORAL | Status: DC

## 2014-06-25 MED ORDER — ROSUVASTATIN CALCIUM 20 MG PO TABS: 20.0000 mg | ORAL_TABLET | Freq: Every day | ORAL | Status: DC

## 2014-06-25 MED ORDER — TRIAMTERENE-HCTZ 37.5-25 MG PO TABS
1.0000 | ORAL_TABLET | Freq: Every morning | ORAL | Status: DC
Start: 2014-06-25 — End: 2014-12-30

## 2014-06-25 MED ORDER — TAMSULOSIN HCL 0.4 MG PO CAPS: 0.4000 mg | ORAL_CAPSULE | Freq: Every day | ORAL | Status: DC

## 2014-06-25 MED ORDER — AMOXICILLIN 500 MG PO CAPS: 500.0000 mg | ORAL_CAPSULE | Freq: Three times a day (TID) | ORAL | Status: DC

## 2014-06-25 MED ORDER — METOPROLOL TARTRATE 50 MG PO TABS: 50.0000 mg | ORAL_TABLET | Freq: Two times a day (BID) | ORAL | Status: DC

## 2014-06-25 MED ORDER — LANSOPRAZOLE 15 MG PO CPDR: 15.0000 mg | DELAYED_RELEASE_CAPSULE | Freq: Every day | ORAL | Status: DC

## 2014-06-25 MED ORDER — CLOPIDOGREL BISULFATE 75 MG PO TABS: ORAL_TABLET | ORAL | Status: DC

## 2014-06-25 NOTE — Progress Notes (Signed)
   Subjective:    Patient ID: [name] [name], male    DOB: Mar 28, 1941, 74 y.o.   MRN: [id number]  Hypertension This is a chronic problem. The current episode started more than 1 year ago. Risk factors for coronary artery disease include dyslipidemia and male gender. Treatments tried: lopressor and maxide.   Patient also having cough and congestion since Christmas. Cough productive at times yellowish phlegm.  Claims compliance with lipid medication. No obvious side effects. Has cut down fats in his diet. Next  Claims compliance with thyroid medication.  Due to see his cardiologist soon.  Review of Systems  No headache no chest pain some bronchial cough some nasal congestion no nausea no abdominal pain ROS otherwise negative     Objective:   Physical Exam Alert vital signs stable blood pressure excellent. HEENT moderate nasal congestion pharynx slight erythema neck supple. Lungs clear heart regular in rhythm ankles without edema       Assessment & Plan:  Impression 1 hypertension good control #2 subacute rhinosinusitis discussed #3 hyperlipidemia status uncertain #4 glucose intolerance status uncertain #5 hypothyroidism status uncertain plan appropriate blood work. Diet exercise discussed. Antibiotics prescribed. Follow-up with specialist as scheduled. Exercise encourage. Further recommendations based on results. Recheck in 6 months. WSL

## 2014-06-26 ENCOUNTER — Other Ambulatory Visit: Payer: Self-pay | Admitting: *Deleted

## 2014-06-26 DIAGNOSIS — E039 Hypothyroidism, unspecified: Secondary | ICD-10-CM | POA: Diagnosis not present

## 2014-06-28 DIAGNOSIS — Z125 Encounter for screening for malignant neoplasm of prostate: Secondary | ICD-10-CM | POA: Diagnosis not present

## 2014-06-28 DIAGNOSIS — Z79899 Other long term (current) drug therapy: Secondary | ICD-10-CM | POA: Diagnosis not present

## 2014-06-28 DIAGNOSIS — E785 Hyperlipidemia, unspecified: Secondary | ICD-10-CM | POA: Diagnosis not present

## 2014-06-29 LAB — BASIC METABOLIC PANEL
BUN: 19 mg/dL (ref 6–23)
CO2: 27 meq/L (ref 19–32)
Calcium: 9.2 mg/dL (ref 8.4–10.5)
Chloride: 102 mEq/L (ref 96–112)
Creat: 0.98 mg/dL (ref 0.50–1.35)
Glucose, Bld: 104 mg/dL — ABNORMAL HIGH (ref 70–99)
POTASSIUM: 4.4 meq/L (ref 3.5–5.3)
SODIUM: 139 meq/L (ref 135–145)

## 2014-06-29 LAB — TSH: TSH: 1.27 u[IU]/mL (ref 0.350–4.500)

## 2014-06-29 LAB — LIPID PANEL
CHOLESTEROL: 124 mg/dL (ref 0–200)
HDL: 47 mg/dL (ref >39)
LDL Cholesterol: 55 mg/dL (ref 0–99)
Total CHOL/HDL Ratio: 2.6 Ratio
Triglycerides: 112 mg/dL (ref <150)
VLDL: 22 mg/dL (ref 0–40)

## 2014-06-29 LAB — HEPATIC FUNCTION PANEL
ALT: 20 U/L (ref 0–53)
AST: 21 U/L (ref 0–37)
Albumin: 4 g/dL (ref 3.5–5.2)
Alkaline Phosphatase: 80 U/L (ref 39–117)
BILIRUBIN TOTAL: 0.6 mg/dL (ref 0.2–1.2)
Bilirubin, Direct: 0.1 mg/dL (ref 0.0–0.3)
Indirect Bilirubin: 0.5 mg/dL (ref 0.2–1.2)
Total Protein: 6.4 g/dL (ref 6.0–8.3)

## 2014-06-30 LAB — PSA, MEDICARE: PSA: 2.83 ng/mL (ref <=4.00)

## 2014-07-01 NOTE — Progress Notes (Signed)
Patient ID: [name] [name], male   DOB: September 22, 1940, 74 y.o.   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 he has seen Dr. Kellie Simmering for surveillance of his AAA. He prefers to have his carotids and subclavian followed up here. I reviewed his from 12/11 Abdominal duplex and his Aoritic and renal grafts are fine  He has not had any significant TIAs her left upper extremity arm weakness. Had a previous CABG. His last catheter was done 310 2008. His LIMA to the LAD was patent with a patent vein graft to the PDA and no significant disease in his circumflex. His LV function was normal. He denies SSCP and has chronic mild exertional dyspnea from lung disease.   Reviewed echo from 6/12 . Has significant SEM. NO AS suprisingly. Normal EF and only mild MR   Had his right knee replaced and has some chronic back pain he needs to F/U with Dr Tonita Cong. Has had injection by [facility]   Duplex 1/15 with high grade LICA stenosis Had CEA with Dr Kellie Simmering 07/19/13 f/u duplex 8/15 40% bilateral residual disease and left subclavian stent not well visualized   AortoIliac duplex today fine reviewed:  No residual AAA or iliac stenosis   ROS: Denies fever, malais, weight loss, blurry vision, decreased visual acuity, cough, sputum, SOB, hemoptysis, pleuritic pain, palpitaitons, heartburn, abdominal pain, melena, lower extremity edema, claudication, or rash.  All other systems reviewed and negative  General: Affect appropriate Healthy:  appears stated age 30: normal Neck supple with no adenopathy JVP normal bilateral  bruits  With CEA;s no thyromegaly Lungs clear with no wheezing and good diaphragmatic motion Heart:  S1/S2 AS murmur, no rub, gallop or click PMI normal Abdomen: benighn, BS positve, no tenderness, no AAA Femoral  bruit.  No HSM or HJR Distal pulses intact with no bruits No edema Neuro non-focal Skin warm and dry No muscular weakness   Current  Outpatient Prescriptions  Medication Sig Dispense Refill  . amoxicillin (AMOXIL) 500 MG capsule Take 1 capsule (500 mg total) by mouth 3 (three) times daily. 21 capsule 0  . aspirin 325 MG tablet Take 325 mg by mouth daily.    . clopidogrel (PLAVIX) 75 MG tablet TAKE ONE (1) TABLET EACH DAY 30 tablet 5  . Coenzyme Q10 (CO Q 10 PO) Take 1 tablet by mouth daily.    [name] KRILL OIL PO Take 1 tablet by mouth daily.    . lansoprazole (PREVACID) 15 MG capsule Take 1 capsule (15 mg total) by mouth daily. 30 capsule 5  . levothyroxine (SYNTHROID, LEVOTHROID) 125 MCG tablet TAKE ONE (1) TABLET EACH DAY 30 tablet 5  . metoprolol (LOPRESSOR) 50 MG tablet Take 1 tablet (50 mg total) by mouth 2 (two) times daily. 60 tablet 5  . niacin 500 MG tablet Take 1,500 mg by mouth daily.     . nitroGLYCERIN (NITROSTAT) 0.4 MG SL tablet Place 1 tablet (0.4 mg total) under the tongue every 5 (five) minutes as needed for chest pain. 25 tablet 10  . rosuvastatin (CRESTOR) 20 MG tablet Take 1 tablet (20 mg total) by mouth daily. 30 tablet 5  . tamsulosin (FLOMAX) 0.4 MG CAPS capsule Take 1 capsule (0.4 mg total) by mouth daily. 30 capsule 5  . triamterene-hydrochlorothiazide (MAXZIDE-25) 37.5-25 MG per tablet Take 1 tablet by mouth every morning. 30 tablet 5   No current facility-administered medications for this visit.    Allergies  Imitrex and Lipitor  Electrocardiogram:  12/15  SR first degree IVCD latera T wave changes   Assessment and Plan

## 2014-07-02 ENCOUNTER — Encounter: Payer: Self-pay | Admitting: Family Medicine

## 2014-07-03 ENCOUNTER — Ambulatory Visit (INDEPENDENT_AMBULATORY_CARE_PROVIDER_SITE_OTHER): Payer: Medicare Other | Admitting: Cardiovascular Disease

## 2014-07-03 ENCOUNTER — Encounter: Payer: Self-pay | Admitting: Cardiovascular Disease

## 2014-07-03 ENCOUNTER — Ambulatory Visit (HOSPITAL_COMMUNITY): Payer: Medicare Other | Attending: Cardiovascular Disease | Admitting: Cardiology

## 2014-07-03 VITALS — BP 120/78 | HR 51 | Ht 65.0 in | Wt 169.0 lb

## 2014-07-03 DIAGNOSIS — I739 Peripheral vascular disease, unspecified: Secondary | ICD-10-CM

## 2014-07-03 DIAGNOSIS — I10 Essential (primary) hypertension: Secondary | ICD-10-CM

## 2014-07-03 DIAGNOSIS — I2510 Atherosclerotic heart disease of native coronary artery without angina pectoris: Secondary | ICD-10-CM

## 2014-07-03 DIAGNOSIS — Z87891 Personal history of nicotine dependence: Secondary | ICD-10-CM | POA: Insufficient documentation

## 2014-07-03 DIAGNOSIS — I700 Atherosclerosis of aorta: Secondary | ICD-10-CM | POA: Diagnosis not present

## 2014-07-03 DIAGNOSIS — E785 Hyperlipidemia, unspecified: Secondary | ICD-10-CM | POA: Insufficient documentation

## 2014-07-03 DIAGNOSIS — R011 Cardiac murmur, unspecified: Secondary | ICD-10-CM | POA: Diagnosis not present

## 2014-07-03 DIAGNOSIS — I708 Atherosclerosis of other arteries: Secondary | ICD-10-CM

## 2014-07-03 DIAGNOSIS — I70299 Other atherosclerosis of native arteries of extremities, unspecified extremity: Secondary | ICD-10-CM | POA: Diagnosis not present

## 2014-07-03 DIAGNOSIS — I350 Nonrheumatic aortic (valve) stenosis: Secondary | ICD-10-CM | POA: Diagnosis not present

## 2014-07-03 DIAGNOSIS — E7800 Pure hypercholesterolemia, unspecified: Secondary | ICD-10-CM

## 2014-07-03 DIAGNOSIS — Z951 Presence of aortocoronary bypass graft: Secondary | ICD-10-CM | POA: Insufficient documentation

## 2014-07-03 DIAGNOSIS — Z8673 Personal history of transient ischemic attack (TIA), and cerebral infarction without residual deficits: Secondary | ICD-10-CM | POA: Diagnosis not present

## 2014-07-03 DIAGNOSIS — J449 Chronic obstructive pulmonary disease, unspecified: Secondary | ICD-10-CM | POA: Insufficient documentation

## 2014-07-03 DIAGNOSIS — I714 Abdominal aortic aneurysm, without rupture: Secondary | ICD-10-CM | POA: Diagnosis not present

## 2014-07-03 NOTE — Assessment & Plan Note (Signed)
Well controlled.  Continue current medications and low sodium Dash type diet.    

## 2014-07-03 NOTE — Assessment & Plan Note (Signed)
Cholesterol is at goal.  Continue current dose of statin and diet Rx.  No myalgias or side effects.  F/U  LFT's in 6 months. Lab Results  Component Value Date   LDLCALC 55 06/28/2014

## 2014-07-03 NOTE — Assessment & Plan Note (Signed)
Previous echo with no AS  F/u echo in 6 months

## 2014-07-03 NOTE — Patient Instructions (Signed)
Your physician recommends that you continue on your current medications as directed. Please refer to the Current Medication list given to you today.  Your physician wants you to follow-up in: 6 month You will receive a reminder letter in the mail two months in advance. If you don't receive a letter, please call our office to schedule the follow-up appointment.   Your physician has requested that you have an echocardiogram. Echocardiography is a painless test that uses sound waves to create images of your heart. It provides your doctor with information about the size and shape of your heart and how well your heart's chambers and valves are working. This procedure takes approximately one hour. There are no restrictions for this procedure. SAME DAY AS APPOINTMENT

## 2014-07-03 NOTE — Assessment & Plan Note (Signed)
Duplex today with no residual AAA and patent iliac limbs  Carotids ok duplex in a year ASA/Plavix

## 2014-07-03 NOTE — Assessment & Plan Note (Signed)
Stable with no angina and good activity level.  Continue medical Rx  

## 2014-07-03 NOTE — Progress Notes (Signed)
Abdominal Aorta Duplex performed

## 2014-07-09 ENCOUNTER — Ambulatory Visit (HOSPITAL_COMMUNITY): Payer: Medicare Other

## 2014-08-11 ENCOUNTER — Encounter: Payer: Self-pay | Admitting: Vascular Surgery

## 2014-08-12 ENCOUNTER — Ambulatory Visit (INDEPENDENT_AMBULATORY_CARE_PROVIDER_SITE_OTHER): Payer: Medicare Other | Admitting: Vascular Surgery

## 2014-08-12 ENCOUNTER — Ambulatory Visit (HOSPITAL_COMMUNITY)
Admission: RE | Admit: 2014-08-12 | Discharge: 2014-08-12 | Disposition: A | Discharge status: Home / Self Care | Payer: Medicare Other | Source: Ambulatory Visit | Attending: Vascular Surgery | Admitting: Vascular Surgery

## 2014-08-12 ENCOUNTER — Encounter: Payer: Self-pay | Admitting: Vascular Surgery

## 2014-08-12 VITALS — BP 157/70 | HR 53 | Resp 16 | Ht 66.5 in | Wt 173.0 lb

## 2014-08-12 DIAGNOSIS — I6523 Occlusion and stenosis of bilateral carotid arteries: Secondary | ICD-10-CM | POA: Diagnosis not present

## 2014-08-12 DIAGNOSIS — Z48812 Encounter for surgical aftercare following surgery on the circulatory system: Secondary | ICD-10-CM | POA: Diagnosis not present

## 2014-08-12 DIAGNOSIS — I6529 Occlusion and stenosis of unspecified carotid artery: Secondary | ICD-10-CM | POA: Insufficient documentation

## 2014-08-12 DIAGNOSIS — I700 Atherosclerosis of aorta: Secondary | ICD-10-CM

## 2014-08-12 DIAGNOSIS — I70299 Other atherosclerosis of native arteries of extremities, unspecified extremity: Secondary | ICD-10-CM

## 2014-08-12 NOTE — Progress Notes (Signed)
Subjective:     Patient ID: [name] [name], male   DOB: June 27, 1940, 74 y.o.   MRN: [id number]  HPI this 74 year old male returns for continued follow-up regarding his bilateral carotid endarterectomies-left side in 2015 and right side in 2005. He also has had a left subclavian stent in 2007. He denies any neurologic symptoms including amaurosis fugax, diplopia, blurred vision, syncope, or aphasia. He has no left arm claudication symptoms. He denies lower extremity claudication. Does take aspirin on a daily basis.  Past Medical History  Diagnosis Date  . HYPOTHYROIDISM   . Pure hypercholesterolemia   . HYPERTENSION   . GERD   . DIVERTICULOSIS OF COLON   . JOINT EFFUSION, KNEE   . OSTEOPOROSIS   . Other dysphagia   . NEPHROLITHIASIS, HX OF   . Myocardial infarction     1994   . CORONARY ARTERY DISEASE   . Atrial fibrillation     no hx of reported at preop visit of 04/21/11   . PERIPHERAL VASCULAR DISEASE     AAA - 1994 with reimplant of renals   . Peripheral vascular disease     subclavian stenosis PTA - 3/08   . CHRONIC OBSTRUCTIVE PULMONARY DISEASE     pt denied at visit of 04/21/11   . Chronic kidney disease     AAA repair with reimplant of renals   . KNEE, ARTHRITIS, DEGEN./OSTEO     right knee   . Renal artery stenosis   . CAD (coronary artery disease)   . Carotid artery occlusion     left carotid endarterectomy  . H/O hiatal hernia   . Headache(784.0)     Hx: of years of years    History  Substance Use Topics  . Smoking status: Former Smoker    Types: Cigarettes    Quit date: 06/20/1992  . Smokeless tobacco: Never Used  . Alcohol Use: No    Family History  Problem Relation Age of Onset  . Hypertension Mother   . Heart disease Father   . Heart attack Father   . Cancer Brother     Lung  . Diabetes Sister     Allergies  Allergen Reactions  . Imitrex [Sumatriptan] Nausea And Vomiting and Other (See Comments)    "made me like I was having a stoke"  .  Lipitor [Atorvastatin] Nausea And Vomiting and Other (See Comments)    "couldn't get out of the bed ( pt had to crawl out of bed ) I hurt so bad"     Current outpatient prescriptions:  .  aspirin 325 MG tablet, Take 325 mg by mouth daily., Disp: , Rfl:  .  clopidogrel (PLAVIX) 75 MG tablet, TAKE ONE (1) TABLET EACH DAY, Disp: 30 tablet, Rfl: 5 .  Coenzyme Q10-Vitamin E 100-300 MG-UNIT WAFR, Take by mouth. Take 1 tablet by mouth daily, Disp: , Rfl:  .  [employer] OMEGA-3 KRILL OIL 500 MG CAPS, Take by mouth. Take 1 tablet by mouth daily, Disp: , Rfl:  .  lansoprazole (PREVACID) 15 MG capsule, Take 1 capsule (15 mg total) by mouth daily., Disp: 30 capsule, Rfl: 5 .  levothyroxine (SYNTHROID, LEVOTHROID) 125 MCG tablet, TAKE ONE (1) TABLET EACH DAY, Disp: 30 tablet, Rfl: 5 .  metoprolol (LOPRESSOR) 50 MG tablet, Take 1 tablet (50 mg total) by mouth 2 (two) times daily., Disp: 60 tablet, Rfl: 5 .  niacin 500 MG tablet, Take 1,500 mg by mouth daily. , Disp: , Rfl:  .  nitroGLYCERIN (NITROSTAT) 0.4 MG SL tablet, Place 1 tablet (0.4 mg total) under the tongue every 5 (five) minutes as needed for chest pain., Disp: 25 tablet, Rfl: 10 .  rosuvastatin (CRESTOR) 20 MG tablet, Take 1 tablet (20 mg total) by mouth daily., Disp: 30 tablet, Rfl: 5 .  tamsulosin (FLOMAX) 0.4 MG CAPS capsule, Take 1 capsule (0.4 mg total) by mouth daily., Disp: 30 capsule, Rfl: 5 .  triamterene-hydrochlorothiazide (MAXZIDE-25) 37.5-25 MG per tablet, Take 1 tablet by mouth every morning., Disp: 30 tablet, Rfl: 5  BP 157/70 mmHg  Pulse 53  Resp 16  Ht 5' 6.5" (1.689 m)  Wt 173 lb (78.472 kg)  BMI 27.51 kg/m2  Body mass index is 27.51 kg/(m^2).           Review of Systems denies chest pain, dyspnea on exertion, PND, orthopnea, claudication. Does complain of   lower back and abdominal discomfort. Also has esophageal reflux on occasion. Denies other symptoms and a complete review of systems Objective:   Physical Exam BP  157/70 mmHg  Pulse 53  Resp 16  Ht 5' 6.5" (1.689 m)  Wt 173 lb (78.472 kg)  BMI 27.51 kg/m2  Gen.-alert and oriented x3 in no apparent distress HEENT normal for age Lungs no rhonchi or wheezing Cardiovascular regular rhythm no murmurs carotid pulses 3+ palpable-harsh bruit on the right no bruit on the left Abdomen soft nontender no palpable masses Musculoskeletal free of  major deformities Skin clear -no rashes Neurologic normal Lower extremities 3+ femoral and dorsalis pedis pulses palpable bilaterally with no edema  Today I ordered a carotid duplex exam which I reviewed and interpreted. The left subclavian stent is widely patent. There is no significant restenosis in either internal carotid artery where the endarterectomies have been performed. There has been no change since August 2015.      Assessment:     Widely patent bilateral carotid endarterectomy sites and left subclavian stent-patient remains asymptomatic History of atrial fibrillation-not currently History of coronary artery disease with previous myocardial infarction Gastroesophageal reflux disease-stable    Plan:     Continue daily aspirin Return in 12 months for follow-up carotid duplex exam to check on status of bilateral carotid endarterectomy sites and left subclavian stent Return earlier if patient develops any neurologic symptoms

## 2014-08-13 NOTE — Addendum Note (Signed)
Addended by: Mena Goes on: 08/13/2014 01:58 PM   Modules accepted: Orders

## 2014-08-28 ENCOUNTER — Other Ambulatory Visit: Payer: Self-pay | Admitting: Family Medicine

## 2014-12-30 ENCOUNTER — Other Ambulatory Visit: Payer: Self-pay | Admitting: Family Medicine

## 2014-12-30 NOTE — Progress Notes (Signed)
Patient ID: [name] [name], male   DOB: June 14, 1941, 74 y.o.   MRN: [id number]   [name] [name] is a vasculopath with history of AAA repair, CABG, left subclavian stent and right CEA. he has seen Dr. Kellie Simmering for surveillance of his AAA. He prefers to have his carotids and subclavian followed up here. I reviewed his from 12/11 Abdominal duplex and his Aoritic and renal grafts are fine  He has not had any significant TIAs her left upper extremity arm weakness. Had a previous CABG. His last catheter was done 310 2008. His LIMA to the LAD was patent with a patent vein graft to the PDA and no significant disease in his circumflex. His LV function was normal. He denies SSCP and has chronic mild exertional dyspnea from lung disease.   Reviewed echo from 6/12 . Has significant SEM. NO AS suprisingly. Normal EF and only mild MR   Had his right knee replaced and has some chronic back pain he needs to F/U with Dr Tonita Cong. Has had injection by [facility]   Duplex 1/15 with high grade LICA stenosis Had CEA with Dr Kellie Simmering 07/19/13  F/u duplex 08/12/14  40% bilateral residual disease and left subclavian stent not well visualized  AortoIliac duplex 08/12/14  reviewed:  No residual AAA or iliac stenosis   ROS: Denies fever, malais, weight loss, blurry vision, decreased visual acuity, cough, sputum, SOB, hemoptysis, pleuritic pain, palpitaitons, heartburn, abdominal pain, melena, lower extremity edema, claudication, or rash.  All other systems reviewed and negative  General: Affect appropriate Healthy:  appears stated age 74: normal Neck supple with no adenopathy JVP normal bilateral  bruits  With CEA;s no thyromegaly Lungs clear with no wheezing and good diaphragmatic motion Heart:  S1/S2 AS murmur, no rub, gallop or click PMI normal Abdomen: benighn, BS positve, no tenderness, no AAA Femoral  bruit.  No HSM or HJR Distal pulses intact with no bruits No edema Neuro non-focal Skin warm and dry No muscular  weakness   Current Outpatient Prescriptions  Medication Sig Dispense Refill  . aspirin 325 MG tablet Take 325 mg by mouth daily.    . clopidogrel (PLAVIX) 75 MG tablet TAKE ONE (1) TABLET EACH DAY 30 tablet 5  . Coenzyme Q10-Vitamin E 100-300 MG-UNIT WAFR Take by mouth. Take 1 tablet by mouth daily    . [employer] OMEGA-3 KRILL OIL 500 MG CAPS Take by mouth. Take 1 tablet by mouth daily    . lansoprazole (PREVACID) 15 MG capsule Take 1 capsule (15 mg total) by mouth daily. 30 capsule 5  . levothyroxine (SYNTHROID, LEVOTHROID) 125 MCG tablet TAKE ONE (1) TABLET EACH DAY 30 tablet 2  . metoprolol (LOPRESSOR) 50 MG tablet Take 1 tablet (50 mg total) by mouth 2 (two) times daily. 60 tablet 5  . niacin 500 MG tablet Take 1,500 mg by mouth daily.     . nitroGLYCERIN (NITROSTAT) 0.4 MG SL tablet Place 1 tablet (0.4 mg total) under the tongue every 5 (five) minutes as needed for chest pain. 25 tablet 10  . rosuvastatin (CRESTOR) 20 MG tablet Take 1 tablet (20 mg total) by mouth daily. 30 tablet 5  . tamsulosin (FLOMAX) 0.4 MG CAPS capsule Take 1 capsule (0.4 mg total) by mouth daily. 30 capsule 5  . triamterene-hydrochlorothiazide (MAXZIDE-25) 37.5-25 MG per tablet Take 1 tablet by mouth every morning. 30 tablet 5   No current facility-administered medications for this visit.    Allergies  Imitrex and Lipitor  Electrocardiogram:  12/15  SR first degree IVCD latera T wave changes   Assessment and Plan CAD:  Stable with no angina and good activity level.  Continue medical Rx Carotid:  Loud bruits unchanged 40% bilateral residual disease post bilateral CEA;s  F/u Dr Kellie Simmering AAA:  F/u duplex in 6 moths post repair with no residual PVD:  Stable mild claudication  LE arterial duplex and ABI's with Dr Kellie Simmering Chol:   Cholesterol is at goal.  Continue current dose of statin and diet Rx.  No myalgias or side effects.  F/U  LFT's in 6 months. Lab Results  Component Value Date   LDLCALC 55 06/28/2014    Murmur:  Echo reviewed today AV sclerosis no stenosis normal EF

## 2014-12-31 ENCOUNTER — Ambulatory Visit (HOSPITAL_COMMUNITY): Payer: Medicare Other | Attending: Cardiology

## 2014-12-31 ENCOUNTER — Other Ambulatory Visit: Payer: Self-pay | Admitting: Cardiovascular Disease

## 2014-12-31 ENCOUNTER — Ambulatory Visit (INDEPENDENT_AMBULATORY_CARE_PROVIDER_SITE_OTHER): Payer: Medicare Other | Admitting: Cardiovascular Disease

## 2014-12-31 ENCOUNTER — Other Ambulatory Visit: Payer: Self-pay

## 2014-12-31 ENCOUNTER — Encounter: Payer: Self-pay | Admitting: Cardiovascular Disease

## 2014-12-31 VITALS — BP 148/68 | HR 58 | Ht 66.5 in | Wt 168.8 lb

## 2014-12-31 DIAGNOSIS — I340 Nonrheumatic mitral (valve) insufficiency: Secondary | ICD-10-CM | POA: Diagnosis not present

## 2014-12-31 DIAGNOSIS — I358 Other nonrheumatic aortic valve disorders: Secondary | ICD-10-CM | POA: Diagnosis not present

## 2014-12-31 DIAGNOSIS — I350 Nonrheumatic aortic (valve) stenosis: Secondary | ICD-10-CM | POA: Diagnosis present

## 2014-12-31 DIAGNOSIS — I739 Peripheral vascular disease, unspecified: Secondary | ICD-10-CM | POA: Diagnosis not present

## 2014-12-31 DIAGNOSIS — I700 Atherosclerosis of aorta: Secondary | ICD-10-CM | POA: Diagnosis not present

## 2014-12-31 DIAGNOSIS — I371 Nonrheumatic pulmonary valve insufficiency: Secondary | ICD-10-CM | POA: Insufficient documentation

## 2014-12-31 DIAGNOSIS — I70299 Other atherosclerosis of native arteries of extremities, unspecified extremity: Secondary | ICD-10-CM | POA: Diagnosis not present

## 2014-12-31 NOTE — Patient Instructions (Signed)
Medication Instructions:  NO CHANGES  Labwork: NONE  Testing/Procedures: NONE  Follow-Up: Your physician wants you to follow-up in: 6 MONTHS WITH DR NISHAN You will receive a reminder letter in the mail two months in advance. If you don't receive a letter, please call our office to schedule the follow-up appointment.  Any Other Special Instructions Will Be Listed Below (If Applicable).   

## 2015-01-13 ENCOUNTER — Other Ambulatory Visit: Payer: Self-pay | Admitting: Family Medicine

## 2015-01-13 ENCOUNTER — Other Ambulatory Visit: Payer: Self-pay | Admitting: *Deleted

## 2015-01-13 MED ORDER — METOPROLOL TARTRATE 50 MG PO TABS: 50.0000 mg | ORAL_TABLET | Freq: Two times a day (BID) | ORAL | Status: DC

## 2015-01-29 ENCOUNTER — Encounter: Payer: Self-pay | Admitting: Family Medicine

## 2015-01-29 ENCOUNTER — Ambulatory Visit (INDEPENDENT_AMBULATORY_CARE_PROVIDER_SITE_OTHER): Payer: Medicare Other | Admitting: Family Medicine

## 2015-01-29 VITALS — BP 120/74 | Temp 97.8°F | Ht 65.5 in | Wt 170.0 lb

## 2015-01-29 DIAGNOSIS — T148 Other injury of unspecified body region: Secondary | ICD-10-CM

## 2015-01-29 DIAGNOSIS — I700 Atherosclerosis of aorta: Secondary | ICD-10-CM

## 2015-01-29 DIAGNOSIS — W57XXXA Bitten or stung by nonvenomous insect and other nonvenomous arthropods, initial encounter: Secondary | ICD-10-CM | POA: Diagnosis not present

## 2015-01-29 DIAGNOSIS — I70299 Other atherosclerosis of native arteries of extremities, unspecified extremity: Secondary | ICD-10-CM | POA: Diagnosis not present

## 2015-01-29 MED ORDER — ASPIRIN EC 81 MG PO TBEC: 81.0000 mg | DELAYED_RELEASE_TABLET | Freq: Every day | ORAL | Status: DC

## 2015-01-29 MED ORDER — DOXYCYCLINE HYCLATE 100 MG PO CAPS: 100.0000 mg | ORAL_CAPSULE | Freq: Two times a day (BID) | ORAL | Status: DC

## 2015-01-29 NOTE — Progress Notes (Signed)
   Subjective:    Patient ID: [name] [name], male    DOB: 1941-03-10, 74 y.o.   MRN: [id number]  HPI Patient has a tick bite on his right arm that will not stop bleeding. Patient would like the doctor to take a look at this.   Patient states he pulled the tick off then he could not stop the bleeding he is taking 325 mg aspirin and Plavix. Review of Systems     Objective:   Physical Exam  That area on his right arm has evidence of recent bleeding the patient actually put some include 1 his skin to try to stop bleeding. Surprisingly it did help. It does not appear to be infected but it does look like it could easily get infected      Assessment & Plan:  I encouraged patient to reduce aspirin to 81 mg aspirin Continue Plavix Doxycycline twice a day 7 days Compression dressing today Remove dressing tomorrow. Follow-up if any signs of infection Patient was counseled that if his hand started swelling of getting known to remove the compression dressing

## 2015-01-29 NOTE — Patient Instructions (Signed)
Tick Bite Information Ticks are insects that attach themselves to the skin and draw blood for food. There are various types of ticks. Common types include wood ticks and deer ticks. Most ticks live in shrubs and grassy areas. Ticks can climb onto your body when you make contact with leaves or grass where the tick is waiting. The most common places on the body for ticks to attach themselves are the scalp, neck, armpits, waist, and groin. Most tick bites are harmless, but sometimes ticks carry germs that cause diseases. These germs can be spread to a person during the tick's feeding process. The chance of a disease spreading through a tick bite depends on:   The type of tick.  Time of year.   How long the tick is attached.   Geographic location.  HOW CAN YOU PREVENT TICK BITES? Take these steps to help prevent tick bites when you are outdoors:  Wear protective clothing. Long sleeves and long pants are best.   Wear white clothes so you can see ticks more easily.  Tuck your pant legs into your socks.   If walking on a trail, stay in the middle of the trail to avoid brushing against bushes.  Avoid walking through areas with long grass.  Put insect repellent on all exposed skin and along boot tops, pant legs, and sleeve cuffs.   Check clothing, hair, and skin repeatedly and before going inside.   Brush off any ticks that are not attached.  Take a shower or bath as soon as possible after being outdoors.  WHAT IS THE PROPER WAY TO REMOVE A TICK? Ticks should be removed as soon as possible to help prevent diseases caused by tick bites. 1. If latex gloves are available, put them on before trying to remove a tick.  2. Using fine-point tweezers, grasp the tick as close to the skin as possible. You may also use curved forceps or a tick removal tool. Grasp the tick as close to its head as possible. Avoid grasping the tick on its body. 3. Pull gently with steady upward pressure until  the tick lets go. Do not twist the tick or jerk it suddenly. This may break off the tick's head or mouth parts. 4. Do not squeeze or crush the tick's body. This could force disease-carrying fluids from the tick into your body.  5. After the tick is removed, wash the bite area and your hands with soap and water or other disinfectant such as alcohol. 6. Apply a small amount of antiseptic cream or ointment to the bite site.  7. Wash and disinfect any instruments that were used.  Do not try to remove a tick by applying a hot match, petroleum jelly, or fingernail polish to the tick. These methods do not work and may increase the chances of disease being spread from the tick bite.  WHEN SHOULD YOU SEEK MEDICAL CARE? Contact your health care provider if you are unable to remove a tick from your skin or if a part of the tick breaks off and is stuck in the skin.  After a tick bite, you need to be aware of signs and symptoms that could be related to diseases spread by ticks. Contact your health care provider if you develop any of the following in the days or weeks after the tick bite:  Unexplained fever.  Rash. A circular rash that appears days or weeks after the tick bite may indicate the possibility of Lyme disease. The rash may resemble   a target with a bull's-eye and may occur at a different part of your body than the tick bite.  Redness and swelling in the area of the tick bite.   Tender, swollen lymph glands.   Diarrhea.   Weight loss.   Cough.   Fatigue.   Muscle, joint, or bone pain.   Abdominal pain.   Headache.   Lethargy or a change in your level of consciousness.  Difficulty walking or moving your legs.   Numbness in the legs.   Paralysis.  Shortness of breath.   Confusion.   Repeated vomiting.  Document Released: 06/03/2000 Document Revised: 03/27/2013 Document Reviewed: 11/14/2012 Exit[employer] Patient Information 2015 [employer]. This information is  not intended to replace advice given to you by your health care provider. Make sure you discuss any questions you have with your health care provider.  

## 2015-02-03 ENCOUNTER — Other Ambulatory Visit: Payer: Self-pay | Admitting: Family Medicine

## 2015-02-14 ENCOUNTER — Other Ambulatory Visit: Payer: Self-pay | Admitting: Family Medicine

## 2015-03-05 ENCOUNTER — Encounter: Payer: Medicare Other | Admitting: Family Medicine

## 2015-03-05 ENCOUNTER — Other Ambulatory Visit: Payer: Self-pay | Admitting: Family Medicine

## 2015-03-11 ENCOUNTER — Other Ambulatory Visit: Payer: Self-pay | Admitting: Family Medicine

## 2015-03-18 ENCOUNTER — Other Ambulatory Visit: Payer: Self-pay | Admitting: Family Medicine

## 2015-03-23 ENCOUNTER — Encounter: Payer: Self-pay | Admitting: Family Medicine

## 2015-03-23 ENCOUNTER — Ambulatory Visit (INDEPENDENT_AMBULATORY_CARE_PROVIDER_SITE_OTHER): Payer: Medicare Other | Admitting: Family Medicine

## 2015-03-23 VITALS — BP 138/70 | Ht 66.5 in | Wt 168.5 lb

## 2015-03-23 DIAGNOSIS — R5383 Other fatigue: Secondary | ICD-10-CM

## 2015-03-23 DIAGNOSIS — Z0000 Encounter for general adult medical examination without abnormal findings: Secondary | ICD-10-CM

## 2015-03-23 DIAGNOSIS — Z23 Encounter for immunization: Secondary | ICD-10-CM | POA: Diagnosis not present

## 2015-03-23 DIAGNOSIS — Z13220 Encounter for screening for lipoid disorders: Secondary | ICD-10-CM

## 2015-03-23 DIAGNOSIS — I10 Essential (primary) hypertension: Secondary | ICD-10-CM

## 2015-03-23 DIAGNOSIS — E785 Hyperlipidemia, unspecified: Secondary | ICD-10-CM | POA: Diagnosis not present

## 2015-03-23 MED ORDER — LEVOTHYROXINE SODIUM 125 MCG PO TABS: ORAL_TABLET | ORAL | Status: DC

## 2015-03-23 MED ORDER — LANSOPRAZOLE 15 MG PO CPDR: 15.0000 mg | DELAYED_RELEASE_CAPSULE | Freq: Every day | ORAL | Status: DC

## 2015-03-23 MED ORDER — CRESTOR 20 MG PO TABS: 20.0000 mg | ORAL_TABLET | Freq: Every day | ORAL | Status: DC

## 2015-03-23 MED ORDER — TAMSULOSIN HCL 0.4 MG PO CAPS: 0.4000 mg | ORAL_CAPSULE | Freq: Every day | ORAL | Status: DC

## 2015-03-23 MED ORDER — CLOPIDOGREL BISULFATE 75 MG PO TABS: ORAL_TABLET | ORAL | Status: DC

## 2015-03-23 MED ORDER — TRIAMTERENE-HCTZ 37.5-25 MG PO TABS: 1.0000 | ORAL_TABLET | Freq: Every morning | ORAL | Status: DC

## 2015-03-23 MED ORDER — METOPROLOL TARTRATE 50 MG PO TABS: 50.0000 mg | ORAL_TABLET | Freq: Two times a day (BID) | ORAL | Status: DC

## 2015-03-23 NOTE — Progress Notes (Signed)
   Subjective:    Patient ID: [name] [name], male    DOB: Nov 10, 1940, 74 y.o.   MRN: [id number]  HPI AWV- Annual Wellness Visit  The patient was seen for their annual wellness visit. The patient's past medical history, surgical history, and family history were reviewed. Pertinent vaccines were reviewed ( tetanus, pneumonia, shingles, flu) The patient's medication list was reviewed and updated.  The height and weight were entered. The patient's current BMI is: 26.79  Cognitive screening was completed. Outcome of Mini - Cog: passed  Falls within the past 6 months:none  Current tobacco usage: non-smoker (All patients who use tobacco were given written and verbal information on quitting)  Recent listing of emergency department/hospitalizations over the past year were reviewed.  current specialist the patient sees on a regular basis: none   Medicare annual wellness visit patient questionnaire was reviewed.  A written screening schedule for the patient for the next 5-10 years was given. Appropriate discussion of followup regarding next visit was discussed.  Last Colonoscopy was 2008.   Feels pain right post neck, feels like a knot worse when turning back and forth.  Compliant with blood pressure medication. No obvious side effects watching salt intake.  tyl helps Concerned about this  Exercising regullly   Review of Systems  Constitutional: Negative for fever, activity change and appetite change.  HENT: Negative for congestion and rhinorrhea.   Eyes: Negative for discharge.  Respiratory: Negative for cough and wheezing.   Cardiovascular: Negative for chest pain.  Gastrointestinal: Negative for vomiting, abdominal pain and blood in stool.  Genitourinary: Negative for frequency and difficulty urinating.  Musculoskeletal: Negative for neck pain.  Skin: Negative for rash.  Allergic/Immunologic: Negative for environmental allergies and food allergies.  Neurological: Negative  for weakness and headaches.  Psychiatric/Behavioral: Negative for agitation.  All other systems reviewed and are negative.      Objective:   Physical Exam  Constitutional: He appears well-developed and well-nourished.  HENT:  Head: Normocephalic and atraumatic.  Right Ear: External ear normal.  Left Ear: External ear normal.  Nose: Nose normal.  Mouth/Throat: Oropharynx is clear and moist.  Eyes: EOM are normal. Pupils are equal, round, and reactive to light.  Neck: Normal range of motion. Neck supple. No thyromegaly present.  Cardiovascular: Normal rate, regular rhythm and normal heart sounds.   No murmur heard. Pulmonary/Chest: Effort normal and breath sounds normal. No respiratory distress. He has no wheezes.  Abdominal: Soft. Bowel sounds are normal. He exhibits no distension and no mass. There is no tenderness.  Genitourinary: Penis normal.  Musculoskeletal: Normal range of motion. He exhibits no edema.  Lymphadenopathy:    He has no cervical adenopathy.  Neurological: He is alert. He exhibits normal muscle tone.  Skin: Skin is warm and dry. No erythema.  Psychiatric: He has a normal mood and affect. His behavior is normal. Judgment normal.  Vitals reviewed.         Assessment & Plan:  Impression wellness exam #2 coronary artery disease #3 hypertension good control #4 hyperlipidemia status uncertain plan flu shot today and pneumonia shot today appropriate blood work. Diet exercise discussed medications refilled neck discomfort reassured no mass on exam likely chronic strain symptomatic care only [facility]

## 2015-03-28 DIAGNOSIS — Z0000 Encounter for general adult medical examination without abnormal findings: Secondary | ICD-10-CM | POA: Diagnosis not present

## 2015-03-28 DIAGNOSIS — Z13220 Encounter for screening for lipoid disorders: Secondary | ICD-10-CM | POA: Diagnosis not present

## 2015-03-28 DIAGNOSIS — R5383 Other fatigue: Secondary | ICD-10-CM | POA: Diagnosis not present

## 2015-03-28 DIAGNOSIS — E785 Hyperlipidemia, unspecified: Secondary | ICD-10-CM | POA: Diagnosis not present

## 2015-03-29 LAB — BASIC METABOLIC PANEL
BUN / CREAT RATIO: 28 — AB (ref 10–22)
BUN: 23 mg/dL (ref 8–27)
CALCIUM: 9.3 mg/dL (ref 8.6–10.2)
CHLORIDE: 102 mmol/L (ref 97–108)
CO2: 24 mmol/L (ref 18–29)
Creatinine, Ser: 0.82 mg/dL (ref 0.76–1.27)
GFR calc Af Amer: 99 mL/min/{1.73_m2} (ref >59)
GFR calc non Af Amer: 86 mL/min/{1.73_m2} (ref >59)
GLUCOSE: 122 mg/dL — AB (ref 65–99)
Potassium: 4 mmol/L (ref 3.5–5.2)
Sodium: 144 mmol/L (ref 134–144)

## 2015-03-29 LAB — LIPID PANEL
CHOL/HDL RATIO: 2.5 ratio (ref 0.0–5.0)
Cholesterol, Total: 118 mg/dL (ref 100–199)
HDL: 47 mg/dL (ref >39)
LDL CALC: 46 mg/dL (ref 0–99)
TRIGLYCERIDES: 123 mg/dL (ref 0–149)
VLDL CHOLESTEROL CAL: 25 mg/dL (ref 5–40)

## 2015-03-29 LAB — HEPATIC FUNCTION PANEL
ALT: 18 IU/L (ref 0–44)
AST: 20 IU/L (ref 0–40)
Albumin: 4.4 g/dL (ref 3.5–4.8)
Alkaline Phosphatase: 78 IU/L (ref 39–117)
BILIRUBIN, DIRECT: 0.18 mg/dL (ref 0.00–0.40)
Bilirubin Total: 0.7 mg/dL (ref 0.0–1.2)
Total Protein: 6.3 g/dL (ref 6.0–8.5)

## 2015-03-29 LAB — TSH: TSH: 0.461 u[IU]/mL (ref 0.450–4.500)

## 2015-04-03 ENCOUNTER — Encounter: Payer: Self-pay | Admitting: Family Medicine

## 2015-06-16 ENCOUNTER — Other Ambulatory Visit: Payer: Self-pay | Admitting: *Deleted

## 2015-06-16 MED ORDER — CRESTOR 20 MG PO TABS: 20.0000 mg | ORAL_TABLET | Freq: Every day | ORAL | Status: DC

## 2015-06-17 ENCOUNTER — Other Ambulatory Visit: Payer: Self-pay | Admitting: *Deleted

## 2015-06-17 MED ORDER — ROSUVASTATIN CALCIUM 20 MG PO TABS: 20.0000 mg | ORAL_TABLET | Freq: Every day | ORAL | Status: DC

## 2015-07-29 ENCOUNTER — Encounter: Payer: Self-pay | Admitting: Cardiology

## 2015-08-07 ENCOUNTER — Encounter: Payer: Self-pay | Admitting: Family

## 2015-08-18 ENCOUNTER — Encounter: Payer: Self-pay | Admitting: Family

## 2015-08-18 ENCOUNTER — Ambulatory Visit (HOSPITAL_COMMUNITY)
Admission: RE | Admit: 2015-08-18 | Discharge: 2015-08-18 | Disposition: A | Discharge status: Home / Self Care | Payer: Medicare Other | Source: Ambulatory Visit | Attending: Family | Admitting: Family

## 2015-08-18 ENCOUNTER — Ambulatory Visit (INDEPENDENT_AMBULATORY_CARE_PROVIDER_SITE_OTHER): Payer: Medicare Other | Admitting: Family

## 2015-08-18 VITALS — BP 137/58 | HR 47 | Ht 66.5 in | Wt 169.5 lb

## 2015-08-18 DIAGNOSIS — Z9889 Other specified postprocedural states: Secondary | ICD-10-CM | POA: Diagnosis not present

## 2015-08-18 DIAGNOSIS — I708 Atherosclerosis of other arteries: Secondary | ICD-10-CM | POA: Diagnosis not present

## 2015-08-18 DIAGNOSIS — E7800 Pure hypercholesterolemia, unspecified: Secondary | ICD-10-CM | POA: Insufficient documentation

## 2015-08-18 DIAGNOSIS — Z48812 Encounter for surgical aftercare following surgery on the circulatory system: Secondary | ICD-10-CM | POA: Diagnosis not present

## 2015-08-18 DIAGNOSIS — I129 Hypertensive chronic kidney disease with stage 1 through stage 4 chronic kidney disease, or unspecified chronic kidney disease: Secondary | ICD-10-CM | POA: Diagnosis not present

## 2015-08-18 DIAGNOSIS — I6523 Occlusion and stenosis of bilateral carotid arteries: Secondary | ICD-10-CM | POA: Diagnosis not present

## 2015-08-18 DIAGNOSIS — N189 Chronic kidney disease, unspecified: Secondary | ICD-10-CM | POA: Diagnosis not present

## 2015-08-18 NOTE — Progress Notes (Signed)
Chief Complaint: Extracranial Carotid Artery Stenosis   History of Present Illness  [name] [name] is a 75 y.o. male patient of Dr. Kellie Simmering who is s/p left carotid endarterectomy in February, 2015 for a 95% left ICA stenosis which was asymptomatic. He's had no neurologic complications since his surgery.  He had previously undergone right carotid endarterectomy in 2005, and a redo of a left subclavian artery stent on 04/05/06.   He returns today for follow up.  Dr. Kellie Simmering last saw pt on 08/12/14. At that time bilateral carotid endarterectomy sites and left subclavian stent were widely patent -patient remained asymptomatic.  History of atrial fibrillation-not currently History of coronary artery disease with previous myocardial infarction Gastroesophageal reflux disease-stable  He continues to take daily Plavix which he was on preoperatively for cardiac reasons. He exercises 5 days/week, for 60-90 minutes. Pt states his blood pressure is usually 120/70, but high in a Dr. Gabriel Carina.  He denies any remote or recent history of stroke or TIA symptoms. He specifically denies a history of amaurosis fugax or monocular blindness, unilateral facial drooping, hemiparesis, or receptive or expressive aphasia.  He denies tingling, numbness, pain, or cold sensation in either hand/arm.   He has known right sciatic nerve issues for which he has received an injection in his spine which helped. He has a ventral hernia.  Pt denies New Medical or Surgical History.  Pt Diabetic: No Pt smoker: former smoker, quit in 1994  Pt meds include: Statin : Yes ASA: Yes  Also taking Plavix  Past Medical History  Diagnosis Date  . HYPOTHYROIDISM   . Pure hypercholesterolemia   . HYPERTENSION   . GERD   . DIVERTICULOSIS OF COLON   . JOINT EFFUSION, KNEE   . OSTEOPOROSIS   . Other dysphagia   . NEPHROLITHIASIS, HX OF   . Myocardial infarction ([place])     1994   . CORONARY ARTERY DISEASE   . Atrial  fibrillation ([place])     no hx of reported at preop visit of 04/21/11   . PERIPHERAL VASCULAR DISEASE     AAA - 1994 with reimplant of renals   . Peripheral vascular disease ([place])     subclavian stenosis PTA - 3/08   . CHRONIC OBSTRUCTIVE PULMONARY DISEASE     pt denied at visit of 04/21/11   . Chronic kidney disease     AAA repair with reimplant of renals   . KNEE, ARTHRITIS, DEGEN./OSTEO     right knee   . Renal artery stenosis ([place])   . CAD (coronary artery disease)   . Carotid artery occlusion     left carotid endarterectomy  . H/O hiatal hernia   . Headache(784.0)     Hx: of years of years    Social History Social History  Substance Use Topics  . Smoking status: Former Smoker    Types: Cigarettes    Quit date: 06/20/1992  . Smokeless tobacco: Never Used  . Alcohol Use: No    Family History Family History  Problem Relation Age of Onset  . Hypertension Mother   . Heart disease Father   . Heart attack Father   . Cancer Brother     Lung  . Diabetes Sister     Surgical History Past Surgical History  Procedure Laterality Date  . Abdominal aortic aneurysm repair      with reimplantation of renals   . Gallbladder surgery  2004   . Knee surgery    . Vascular  surgery      AAA  . Cardiac catheterization      2008  . Coronary artery bypass graft      1995  . Joint replacement      partial knee replacement on left 2002   . Other surgical history      left subclavian stenosis surgery PTA 08/2006   . Other surgical history      carotid surgery on right 2004   . Total knee arthroplasty  04/29/2011    Procedure: TOTAL KNEE ARTHROPLASTY;  Surgeon: Gearlean Alf;  Location: [facility];  Service: Orthopedics;  Laterality: Right;  . Vasectomy  1973  . Colonoscopy w/ biopsies and polypectomy      Hx: of  . Coronary stent placement      Hx: of  . Endarterectomy Left 07/19/2013    Procedure: ENDARTERECTOMY CAROTID;  Surgeon: Mal Misty, MD;  Location: [facility];  Service:  Vascular;  Laterality: Left;  . Carotid endarterectomy Left Jan. 30, 2015    CEA  . Cholecystectomy  2000    Gall Bladder    Allergies  Allergen Reactions  . Imitrex [Sumatriptan] Nausea And Vomiting and Other (See Comments)    "made me like I was having a stoke"  . Lipitor [Atorvastatin] Nausea And Vomiting and Other (See Comments)    "couldn't get out of the bed ( pt had to crawl out of bed ) I hurt so bad"    Current Outpatient Prescriptions  Medication Sig Dispense Refill  . aspirin EC 81 MG tablet Take 1 tablet (81 mg total) by mouth daily. 30 tablet 5  . clopidogrel (PLAVIX) 75 MG tablet TAKE ONE (1) TABLET EACH DAY 30 tablet 5  . Coenzyme Q10-Vitamin E 100-300 MG-UNIT WAFR Take by mouth. Take 1 tablet by mouth daily    . [employer]S OMEGA-3 KRILL OIL 500 MG CAPS Take by mouth. Take 1 tablet by mouth daily    . lansoprazole (PREVACID) 15 MG capsule Take 1 capsule (15 mg total) by mouth daily. 30 capsule 5  . levothyroxine (SYNTHROID, LEVOTHROID) 125 MCG tablet TAKE ONE (1) TABLET EACH DAY 30 tablet 5  . metoprolol (LOPRESSOR) 50 MG tablet Take 1 tablet (50 mg total) by mouth 2 (two) times daily. 60 tablet 5  . niacin 500 MG tablet Take 1,500 mg by mouth daily.     . nitroGLYCERIN (NITROSTAT) 0.4 MG SL tablet Place 1 tablet (0.4 mg total) under the tongue every 5 (five) minutes as needed for chest pain. 25 tablet 10  . rosuvastatin (CRESTOR) 20 MG tablet Take 1 tablet (20 mg total) by mouth daily. 30 tablet 2  . tamsulosin (FLOMAX) 0.4 MG CAPS capsule Take 1 capsule (0.4 mg total) by mouth daily. 30 capsule 5  . triamterene-hydrochlorothiazide (MAXZIDE-25) 37.5-25 MG tablet Take 1 tablet by mouth every morning. 30 tablet 5   No current facility-administered medications for this visit.    Review of Systems : See HPI for pertinent positives and negatives.  Physical Examination  Filed Vitals:   08/18/15 1055 08/18/15 1058  BP: 158/68 137/58  Pulse: 47   Height: 5' 6.5" (1.689 m)    Weight: 169 lb 8 oz (76.885 kg)   SpO2: 98%    Body mass index is 26.95 kg/(m^2).  General: WDWN male in NAD GAIT: normal Eyes: PERRLA Pulmonary: Non-labored, CTAB.  Cardiac: regular rhythm, + murmur.  VASCULAR EXAM Carotid Bruits Right Left   Positive, harsh Positive   Aorta is  not palpable. Radial pulses are 2+ palpable and equal.      LE Pulses Right Left   POPLITEAL not palpable  not palpable   POSTERIOR TIBIAL  palpable   palpable    DORSALIS PEDIS  ANTERIOR TIBIAL not palpable  not palpable     Gastrointestinal: soft, nontender, BS WNL, no r/g,reducible ventral hernia.  Musculoskeletal: No muscle atrophy/wasting. M/S 5/5 throughout, Extremities without ischemic changes  Neurologic: A&O X 3; Appropriate Affect, normal sensation,  Speech is normal CN 2-12 intact except is hard of hearing, Pain and light touch intact in extremities, Motor exam as listed above.          Non-Invasive Vascular Imaging CAROTID DUPLEX 08/18/2015   Right ICA: 1 - 39 % stenosis. Left ICA: no stenosis. No significant stenosis of the bilateral external or common carotid arteries. Patent left proximal subclavian artery stent noted based on limited visualization with a velocity of 291 cm/s. Velocity of 489 cm/s noted in the distal innominate artery with calcific plaque. No significant change in the bilateral carotid artery velocities compared to previous exam of 08/12/14.     Assessment: [name] [name] is a 75 y.o. male who is s/p left carotid endarterectomy in February, 2015 for a 95% left ICA stenosis which was asymptomatic. He had previously undergone right carotid endarterectomy in 2005. He also had a redo of a left subclavian artery stent on 04/05/06. He has no hx of stroke or TIA. He denies tingling,  numbness, pain, or cold sensation in either hand/arm.    Today's carotid duplex suggests minimal right ICA stenosis and no left ICA stenosis. Patent left proximal subclavian artery stent noted based on limited visualization with a velocity of 291 cm/s. Velocity of 489 cm/s noted in the distal innominate artery with calcific plaque.   I discussed with Dr. Kellie Simmering pt's  Carotid duplex results of today, particularly pt's asymptomatic innominate artery stenosis.   Plan: Follow-up in 1 year with Carotid Duplex scan.   I discussed in depth with the patient the nature of atherosclerosis, and emphasized the importance of maximal medical management including strict control of blood pressure, blood glucose, and lipid levels, obtaining regular exercise, and continued cessation of smoking.  The patient is aware that without maximal medical management the underlying atherosclerotic disease process will progress, limiting the benefit of any interventions. The patient was given information about stroke prevention and what symptoms should prompt the patient to seek immediate medical care. Thank you for allowing [place] to participate in this patient's care.  Clemon Chambers, RN, MSN, FNP-C [facility] Office: [phone]  Clinic Physician: Kellie Simmering  08/18/2015 11:09 AM

## 2015-08-18 NOTE — Patient Instructions (Signed)
Stroke Prevention Some medical conditions and behaviors are associated with an increased chance of having a stroke. You may prevent a stroke by making healthy choices and managing medical conditions. HOW CAN I REDUCE MY RISK OF HAVING A STROKE?   Stay physically active. Get at least 30 minutes of activity on most or all days.  Do not smoke. It may also be helpful to avoid exposure to secondhand smoke.  Limit alcohol use. Moderate alcohol use is considered to be:  No more than 2 drinks per day for men.  No more than 1 drink per day for nonpregnant women.  Eat healthy foods. This involves:  Eating 5 or more servings of fruits and vegetables a day.  Making dietary changes that address high blood pressure (hypertension), high cholesterol, diabetes, or obesity.  Manage your cholesterol levels.  Making food choices that are high in fiber and low in saturated fat, trans fat, and cholesterol may control cholesterol levels.  Take any prescribed medicines to control cholesterol as directed by your health care provider.  Manage your diabetes.  Controlling your carbohydrate and sugar intake is recommended to manage diabetes.  Take any prescribed medicines to control diabetes as directed by your health care provider.  Control your hypertension.  Making food choices that are low in salt (sodium), saturated fat, trans fat, and cholesterol is recommended to manage hypertension.  Ask your health care provider if you need treatment to lower your blood pressure. Take any prescribed medicines to control hypertension as directed by your health care provider.  If you are 18-39 years of age, have your blood pressure checked every 3-5 years. If you are 40 years of age or older, have your blood pressure checked every year.  Maintain a healthy weight.  Reducing calorie intake and making food choices that are low in sodium, saturated fat, trans fat, and cholesterol are recommended to manage  weight.  Stop drug abuse.  Avoid taking birth control pills.  Talk to your health care provider about the risks of taking birth control pills if you are over 35 years old, smoke, get migraines, or have ever had a blood clot.  Get evaluated for sleep disorders (sleep apnea).  Talk to your health care provider about getting a sleep evaluation if you snore a lot or have excessive sleepiness.  Take medicines only as directed by your health care provider.  For some people, aspirin or blood thinners (anticoagulants) are helpful in reducing the risk of forming abnormal blood clots that can lead to stroke. If you have the irregular heart rhythm of atrial fibrillation, you should be on a blood thinner unless there is a good reason you cannot take them.  Understand all your medicine instructions.  Make sure that other conditions (such as anemia or atherosclerosis) are addressed. SEEK IMMEDIATE MEDICAL CARE IF:   You have sudden weakness or numbness of the face, arm, or leg, especially on one side of the body.  Your face or eyelid droops to one side.  You have sudden confusion.  You have trouble speaking (aphasia) or understanding.  You have sudden trouble seeing in one or both eyes.  You have sudden trouble walking.  You have dizziness.  You have a loss of balance or coordination.  You have a sudden, severe headache with no known cause.  You have new chest pain or an irregular heartbeat. Any of these symptoms may represent a serious problem that is an emergency. Do not wait to see if the symptoms will   go away. Get medical help at once. Call your local emergency services (911 in [place] Do not drive yourself to the hospital.   This information is not intended to replace advice given to you by your health care provider. Make sure you discuss any questions you have with your health care provider.   Document Released: 07/14/2004 Document Revised: 06/27/2014 Document Reviewed:  12/07/2012 Elsevier Interactive Patient Education 2016 Elsevier Inc.  

## 2015-09-16 ENCOUNTER — Other Ambulatory Visit: Payer: Self-pay | Admitting: Family Medicine

## 2015-09-21 ENCOUNTER — Ambulatory Visit (INDEPENDENT_AMBULATORY_CARE_PROVIDER_SITE_OTHER): Payer: Medicare Other | Admitting: Family Medicine

## 2015-09-21 ENCOUNTER — Encounter: Payer: Self-pay | Admitting: Family Medicine

## 2015-09-21 VITALS — BP 122/70 | Ht 65.5 in | Wt 167.0 lb

## 2015-09-21 DIAGNOSIS — E785 Hyperlipidemia, unspecified: Secondary | ICD-10-CM | POA: Diagnosis not present

## 2015-09-21 DIAGNOSIS — I10 Essential (primary) hypertension: Secondary | ICD-10-CM | POA: Diagnosis not present

## 2015-09-21 DIAGNOSIS — Z131 Encounter for screening for diabetes mellitus: Secondary | ICD-10-CM | POA: Diagnosis not present

## 2015-09-21 DIAGNOSIS — I6523 Occlusion and stenosis of bilateral carotid arteries: Secondary | ICD-10-CM

## 2015-09-21 DIAGNOSIS — R7301 Impaired fasting glucose: Secondary | ICD-10-CM

## 2015-09-21 DIAGNOSIS — Z79899 Other long term (current) drug therapy: Secondary | ICD-10-CM

## 2015-09-21 MED ORDER — METOPROLOL TARTRATE 50 MG PO TABS: 50.0000 mg | ORAL_TABLET | Freq: Two times a day (BID) | ORAL | Status: DC

## 2015-09-21 MED ORDER — ROSUVASTATIN CALCIUM 20 MG PO TABS: 20.0000 mg | ORAL_TABLET | Freq: Every day | ORAL | Status: DC

## 2015-09-21 MED ORDER — TRIAMTERENE-HCTZ 37.5-25 MG PO TABS
1.0000 | ORAL_TABLET | Freq: Every morning | ORAL | Status: DC
Start: 2015-09-21 — End: 2016-03-23

## 2015-09-21 MED ORDER — LEVOTHYROXINE SODIUM 125 MCG PO TABS: ORAL_TABLET | ORAL | Status: DC

## 2015-09-21 MED ORDER — CLOPIDOGREL BISULFATE 75 MG PO TABS: ORAL_TABLET | ORAL | Status: DC

## 2015-09-21 MED ORDER — LANSOPRAZOLE 15 MG PO CPDR: 15.0000 mg | DELAYED_RELEASE_CAPSULE | Freq: Every day | ORAL | Status: DC

## 2015-09-21 MED ORDER — TAMSULOSIN HCL 0.4 MG PO CAPS: 0.4000 mg | ORAL_CAPSULE | Freq: Every day | ORAL | Status: DC

## 2015-09-21 NOTE — Progress Notes (Signed)
   Subjective:    Patient ID: [name] [name], male    DOB: [birth year], 75 y.o.   MRN: [id number]  Hypertension This is a chronic problem. The current episode started more than 1 year ago. Risk factors for coronary artery disease include dyslipidemia and male gender. Treatments tried: lopressor, maxide. There are no compliance problems.    Staying active five d per wk  Compliant with lipid meds, no obvious side effects. Compliant with medications. Meds reviewed today. Prior blood work reviewed.  Compliant with bp meds . Does not miss a dose. Watching diet. Has cut down salt intake. Generally good when checked elsewhere  Right hip pain flares up worse in the mornings at times. Uses Tylenol significant help minimal radiation. Positive history of arthritis next  Continues to see cardiologist   Review of Systems No headache no chest pain no back pain no abdominal pain no change bowel habits no blood in stool ROS otherwise negative    Objective:   Physical Exam  Alert vital stable HEENT normal lungs clear heart regular in rhythm. Ankles without edema hip positive pain with flexion/rotation both internally and externally      Assessment & Plan:  Patient arrives office with numerous concerns impression 1 probable arthritis of the hip discussed to maintain symptom care #2 hypertension good control maintain same meds #3 hyperlipidemia status uncertain the need to check blood work. Good on prior assessment plan diet exercise discussed. Medications refilled. Appropriate blood work follow-up in 6 months WSL also impaired fasting glucose check status

## 2015-09-26 DIAGNOSIS — Z131 Encounter for screening for diabetes mellitus: Secondary | ICD-10-CM | POA: Diagnosis not present

## 2015-09-26 DIAGNOSIS — Z79899 Other long term (current) drug therapy: Secondary | ICD-10-CM | POA: Diagnosis not present

## 2015-09-26 DIAGNOSIS — E785 Hyperlipidemia, unspecified: Secondary | ICD-10-CM | POA: Diagnosis not present

## 2015-09-27 ENCOUNTER — Encounter: Payer: Self-pay | Admitting: Family Medicine

## 2015-09-27 LAB — HEPATIC FUNCTION PANEL
ALBUMIN: 4.3 g/dL (ref 3.5–4.8)
ALK PHOS: 80 IU/L (ref 39–117)
ALT: 24 IU/L (ref 0–44)
AST: 21 IU/L (ref 0–40)
BILIRUBIN TOTAL: 0.9 mg/dL (ref 0.0–1.2)
BILIRUBIN, DIRECT: 0.22 mg/dL (ref 0.00–0.40)
Total Protein: 6.5 g/dL (ref 6.0–8.5)

## 2015-09-27 LAB — GLUCOSE, RANDOM: Glucose: 112 mg/dL — ABNORMAL HIGH (ref 65–99)

## 2015-09-27 LAB — LIPID PANEL
CHOLESTEROL TOTAL: 125 mg/dL (ref 100–199)
Chol/HDL Ratio: 2.9 ratio units (ref 0.0–5.0)
HDL: 43 mg/dL (ref >39)
LDL CALC: 51 mg/dL (ref 0–99)
TRIGLYCERIDES: 157 mg/dL — AB (ref 0–149)
VLDL Cholesterol Cal: 31 mg/dL (ref 5–40)

## 2016-03-23 ENCOUNTER — Encounter: Payer: Self-pay | Admitting: Family Medicine

## 2016-03-23 ENCOUNTER — Ambulatory Visit (INDEPENDENT_AMBULATORY_CARE_PROVIDER_SITE_OTHER): Payer: Medicare Other | Admitting: Family Medicine

## 2016-03-23 VITALS — BP 130/80 | Ht 67.0 in | Wt 159.0 lb

## 2016-03-23 DIAGNOSIS — Z79899 Other long term (current) drug therapy: Secondary | ICD-10-CM

## 2016-03-23 DIAGNOSIS — I10 Essential (primary) hypertension: Secondary | ICD-10-CM

## 2016-03-23 DIAGNOSIS — E785 Hyperlipidemia, unspecified: Secondary | ICD-10-CM | POA: Diagnosis not present

## 2016-03-23 DIAGNOSIS — Z0000 Encounter for general adult medical examination without abnormal findings: Secondary | ICD-10-CM | POA: Diagnosis not present

## 2016-03-23 DIAGNOSIS — Z23 Encounter for immunization: Secondary | ICD-10-CM

## 2016-03-23 DIAGNOSIS — I6523 Occlusion and stenosis of bilateral carotid arteries: Secondary | ICD-10-CM | POA: Diagnosis not present

## 2016-03-23 DIAGNOSIS — E039 Hypothyroidism, unspecified: Secondary | ICD-10-CM

## 2016-03-23 MED ORDER — LEVOTHYROXINE SODIUM 125 MCG PO TABS: ORAL_TABLET | ORAL | 5 refills | Status: DC

## 2016-03-23 MED ORDER — LANSOPRAZOLE 15 MG PO CPDR: 15.0000 mg | DELAYED_RELEASE_CAPSULE | Freq: Every day | ORAL | 5 refills | Status: DC

## 2016-03-23 MED ORDER — ROSUVASTATIN CALCIUM 20 MG PO TABS: 20.0000 mg | ORAL_TABLET | Freq: Every day | ORAL | 5 refills | Status: DC

## 2016-03-23 MED ORDER — METOPROLOL TARTRATE 50 MG PO TABS: 50.0000 mg | ORAL_TABLET | Freq: Two times a day (BID) | ORAL | 5 refills | Status: DC

## 2016-03-23 MED ORDER — TAMSULOSIN HCL 0.4 MG PO CAPS: 0.4000 mg | ORAL_CAPSULE | Freq: Every day | ORAL | 5 refills | Status: DC

## 2016-03-23 MED ORDER — TRIAMTERENE-HCTZ 37.5-25 MG PO TABS
1.0000 | ORAL_TABLET | Freq: Every morning | ORAL | 5 refills | Status: DC
Start: 2016-03-23 — End: 2016-09-16

## 2016-03-23 MED ORDER — CLOPIDOGREL BISULFATE 75 MG PO TABS: ORAL_TABLET | ORAL | 5 refills | Status: DC

## 2016-03-23 NOTE — Progress Notes (Signed)
   Subjective:    Patient ID: [name] [name], male    DOB: 25-Jul-1940, 75 y.o.   MRN: [id number]  HPI AWV- Annual Wellness Visit  The patient was seen for their annual wellness visit. The patient's past medical history, surgical history, and family history were reviewed. Pertinent vaccines were reviewed ( tetanus, pneumonia, shingles, flu) The patient's medication list was reviewed and updated.  The height and weight were entered. The patient's current BMI is:24.90  Cognitive screening was completed. Outcome of Mini - Cog: passed  Falls within the past 6 months:none  Current tobacco usage: none (All patients who use tobacco were given written and verbal information on quitting)  Recent listing of emergency department/hospitalizations over the past year were reviewed.  current specialist the patient sees on a regular basis: Cardiology    Medicare annual wellness visit patient questionnaire was reviewed.  A written screening schedule for the patient for the next 5-10 years was given. Appropriate discussion of followup regarding next visit was discussed.  Patient had a colonoscopy 9 years ago.   Patient has no concerns at this time.     Review of Systems     Objective:   Physical Exam        Assessment & Plan:

## 2016-03-23 NOTE — Progress Notes (Signed)
Subjective:     Patient ID: [name] [name], male   DOB: 10/14/1940, 75 y.o.   MRN: [id number]  HPI See nurses notes  Already got flu shot   utd on immunizations    Blood pressure medicine and blood pressure levels reviewed today with patient. Compliant with blood pressure medicine. States does not miss a dose. No obvious side effects. Blood pressure generally good when checked elsewhere. Watching salt intake.   Patient continues to take lipid medication regularly. No obvious side effects from it. Generally does not miss a dose. Prior blood work results are reviewed with patient. Patient continues to work on fat intake in diet   Review of Systems  Constitutional: Negative for activity change, appetite change and fever.  HENT: Negative for congestion and rhinorrhea.   Eyes: Negative for discharge.  Respiratory: Negative for cough and wheezing.   Cardiovascular: Negative for chest pain.  Gastrointestinal: Negative for abdominal pain, blood in stool and vomiting.  Genitourinary: Negative for difficulty urinating and frequency.  Musculoskeletal: Negative for neck pain.  Skin: Negative for rash.  Allergic/Immunologic: Negative for environmental allergies and food allergies.  Neurological: Negative for weakness and headaches.  Psychiatric/Behavioral: Negative for agitation.  All other systems reviewed and are negative.      Objective:   Physical Exam  Constitutional: He appears well-developed and well-nourished.  HENT:  Head: Normocephalic and atraumatic.  Right Ear: External ear normal.  Left Ear: External ear normal.  Nose: Nose normal.  Mouth/Throat: Oropharynx is clear and moist.  Eyes: EOM are normal. Pupils are equal, round, and reactive to light.  Neck: Normal range of motion. Neck supple. No thyromegaly present.  Cardiovascular: Normal rate, regular rhythm and normal heart sounds.   No murmur heard. Pulmonary/Chest: Effort normal and breath sounds normal. No respiratory  distress. He has no wheezes.  Abdominal: Soft. Bowel sounds are normal. He exhibits no distension and no mass. There is no tenderness.  Genitourinary: Penis normal.  Musculoskeletal: Normal range of motion. He exhibits no edema.  Lymphadenopathy:    He has no cervical adenopathy.  Neurological: He is alert. He exhibits normal muscle tone.  Skin: Skin is warm and dry. No erythema.  Psychiatric: He has a normal mood and affect. His behavior is normal. Judgment normal.  Vitals reviewed.      Assessment:    impression 1 wellness exam diet exercise discussed. Up to date on vaccines. Colonoscopy due next year. #2 hypertension good control discussed maintain same #3 hyperlipidemia prior numbers good discussed maintain same weight further blood work #4 hypothyroidism clinically stable but await blood work     Plan:

## 2016-03-26 DIAGNOSIS — E039 Hypothyroidism, unspecified: Secondary | ICD-10-CM | POA: Diagnosis not present

## 2016-03-26 DIAGNOSIS — E785 Hyperlipidemia, unspecified: Secondary | ICD-10-CM | POA: Diagnosis not present

## 2016-03-26 DIAGNOSIS — Z79899 Other long term (current) drug therapy: Secondary | ICD-10-CM | POA: Diagnosis not present

## 2016-03-27 LAB — BASIC METABOLIC PANEL
BUN/Creatinine Ratio: 20 (ref 10–24)
BUN: 16 mg/dL (ref 8–27)
CALCIUM: 9.7 mg/dL (ref 8.6–10.2)
CHLORIDE: 101 mmol/L (ref 96–106)
CO2: 25 mmol/L (ref 18–29)
Creatinine, Ser: 0.79 mg/dL (ref 0.76–1.27)
GFR calc non Af Amer: 87 mL/min/{1.73_m2} (ref >59)
GFR, EST AFRICAN AMERICAN: 100 mL/min/{1.73_m2} (ref >59)
Glucose: 130 mg/dL — ABNORMAL HIGH (ref 65–99)
POTASSIUM: 4.1 mmol/L (ref 3.5–5.2)
Sodium: 142 mmol/L (ref 134–144)

## 2016-03-27 LAB — LIPID PANEL
CHOLESTEROL TOTAL: 117 mg/dL (ref 100–199)
Chol/HDL Ratio: 2.6 ratio units (ref 0.0–5.0)
HDL: 45 mg/dL (ref >39)
LDL CALC: 42 mg/dL (ref 0–99)
TRIGLYCERIDES: 152 mg/dL — AB (ref 0–149)
VLDL CHOLESTEROL CAL: 30 mg/dL (ref 5–40)

## 2016-03-27 LAB — HEPATIC FUNCTION PANEL
ALT: 23 IU/L (ref 0–44)
AST: 22 IU/L (ref 0–40)
Albumin: 4.3 g/dL (ref 3.5–4.8)
Alkaline Phosphatase: 81 IU/L (ref 39–117)
BILIRUBIN TOTAL: 0.7 mg/dL (ref 0.0–1.2)
Bilirubin, Direct: 0.24 mg/dL (ref 0.00–0.40)
Total Protein: 6.4 g/dL (ref 6.0–8.5)

## 2016-03-27 LAB — TSH: TSH: 0.219 u[IU]/mL — ABNORMAL LOW (ref 0.450–4.500)

## 2016-03-28 ENCOUNTER — Other Ambulatory Visit: Payer: Self-pay

## 2016-03-28 MED ORDER — LEVOTHYROXINE SODIUM 112 MCG PO TABS: 112.0000 ug | ORAL_TABLET | Freq: Every day | ORAL | 5 refills | Status: DC

## 2016-08-10 ENCOUNTER — Encounter: Payer: Self-pay | Admitting: Family

## 2016-08-11 ENCOUNTER — Other Ambulatory Visit: Payer: Self-pay

## 2016-08-11 DIAGNOSIS — Z9889 Other specified postprocedural states: Secondary | ICD-10-CM

## 2016-08-11 DIAGNOSIS — I6529 Occlusion and stenosis of unspecified carotid artery: Secondary | ICD-10-CM

## 2016-08-17 ENCOUNTER — Ambulatory Visit (HOSPITAL_COMMUNITY)
Admission: RE | Admit: 2016-08-17 | Discharge: 2016-08-17 | Disposition: A | Discharge status: Home / Self Care | Payer: Medicare Other | Source: Ambulatory Visit | Attending: Family | Admitting: Family

## 2016-08-17 ENCOUNTER — Encounter: Payer: Self-pay | Admitting: Family

## 2016-08-17 ENCOUNTER — Ambulatory Visit (INDEPENDENT_AMBULATORY_CARE_PROVIDER_SITE_OTHER): Payer: Medicare Other | Admitting: Family

## 2016-08-17 VITALS — BP 180/70 | HR 53 | Temp 97.0°F | Resp 18 | Ht 66.0 in | Wt 159.0 lb

## 2016-08-17 DIAGNOSIS — I708 Atherosclerosis of other arteries: Secondary | ICD-10-CM | POA: Diagnosis not present

## 2016-08-17 DIAGNOSIS — I6529 Occlusion and stenosis of unspecified carotid artery: Secondary | ICD-10-CM | POA: Diagnosis not present

## 2016-08-17 DIAGNOSIS — Z9889 Other specified postprocedural states: Secondary | ICD-10-CM

## 2016-08-17 DIAGNOSIS — I6521 Occlusion and stenosis of right carotid artery: Secondary | ICD-10-CM | POA: Insufficient documentation

## 2016-08-17 DIAGNOSIS — I6523 Occlusion and stenosis of bilateral carotid arteries: Secondary | ICD-10-CM

## 2016-08-17 NOTE — Progress Notes (Signed)
Chief Complaint: Follow up Extracranial Carotid Artery Stenosis   History of Present Illness  [name] [name] is a 76 y.o. male patient of Dr. Kellie Simmering who is s/p left carotid endarterectomy in February, 2015 for a 95% left ICA stenosis which was asymptomatic. He's had no neurologic complications since his surgery.  He had previously undergone right carotid endarterectomy in 2005, and a redo of a left subclavian artery stent on 04/05/06.   He returns today for follow up.  Dr. Kellie Simmering last saw pt on 08/12/14. At that time bilateral carotid endarterectomy sites and left subclavian stent were widely patent -patient remained asymptomatic.  History of atrial fibrillation-not currently History of coronary artery disease with previous myocardial infarction Gastroesophageal reflux disease-stable  He continues to take daily Plavix which he was on preoperatively for cardiac reasons. He exercises 5 days/week, for 60-90 minutes. Pt states his blood pressure is usually 120/70, but high in a Dr. Gabriel Carina.  He denies any remote or recent history of stroke or TIA symptoms. He specifically denies a history of amaurosis fugax or monocular blindness, unilateral facial drooping, hemiparesis, or receptive or expressive aphasia.  He denies tingling, numbness, pain, or cold sensation in either hand/arm.   He has known right sciatic nerve issues for which he has received an injection in his spine which helped. He has a ventral hernia.  Pt denies New Medical or Surgical History.  Pt Diabetic: No Pt smoker: former smoker, quit in 1994  Pt meds include: Statin : Yes ASA: Yes  Also taking Plavix    Past Medical History:  Diagnosis Date  . Atrial fibrillation ([place])    no hx of reported at preop visit of 04/21/11   . CAD (coronary artery disease)   . Carotid artery occlusion    left carotid endarterectomy  . Chronic kidney disease    AAA repair with reimplant of renals   . CHRONIC  OBSTRUCTIVE PULMONARY DISEASE    pt denied at visit of 04/21/11   . CORONARY ARTERY DISEASE   . DIVERTICULOSIS OF COLON   . GERD   . H/O hiatal hernia   . Headache(784.0)    Hx: of years of years  . HYPERTENSION   . HYPOTHYROIDISM   . JOINT EFFUSION, KNEE   . KNEE, ARTHRITIS, DEGEN./OSTEO    right knee   . Myocardial infarction    1994   . NEPHROLITHIASIS, HX OF   . OSTEOPOROSIS   . Other dysphagia   . PERIPHERAL VASCULAR DISEASE    AAA - 1994 with reimplant of renals   . Peripheral vascular disease ([place])    subclavian stenosis PTA - 3/08   . Pure hypercholesterolemia   . Renal artery stenosis [facility])     Social History Social History  Substance Use Topics  . Smoking status: Former Smoker    Types: Cigarettes    Quit date: 06/20/1992  . Smokeless tobacco: Never Used  . Alcohol use No    Family History Family History  Problem Relation Age of Onset  . Hypertension Mother   . Heart disease Father   . Heart attack Father   . Cancer Brother     Lung  . Diabetes Sister     Surgical History Past Surgical History:  Procedure Laterality Date  . ABDOMINAL AORTIC ANEURYSM REPAIR     with reimplantation of renals   . CARDIAC CATHETERIZATION     2008  . CAROTID ENDARTERECTOMY Left Jan. 30, 2015   CEA  . CHOLECYSTECTOMY  2000   Gall Bladder  . COLONOSCOPY W/ BIOPSIES AND POLYPECTOMY     Hx: of  . CORONARY ARTERY BYPASS GRAFT     1995  . CORONARY STENT PLACEMENT     Hx: of  . ENDARTERECTOMY Left 07/19/2013   Procedure: ENDARTERECTOMY CAROTID;  Surgeon: Mal Misty, MD;  Location: [facility];  Service: Vascular;  Laterality: Left;  . GALLBLADDER SURGERY  2004   . JOINT REPLACEMENT     partial knee replacement on left 2002   . KNEE SURGERY    . OTHER SURGICAL HISTORY     left subclavian stenosis surgery PTA 08/2006   . OTHER SURGICAL HISTORY     carotid surgery on right 2004   . TOTAL KNEE ARTHROPLASTY  04/29/2011   Procedure: TOTAL KNEE ARTHROPLASTY;  Surgeon: Gearlean Alf;  Location: [facility];  Service: Orthopedics;  Laterality: Right;  [name] VASCULAR SURGERY     AAA  . VASECTOMY  1973    Allergies  Allergen Reactions  . Imitrex [Sumatriptan] Nausea And Vomiting and Other (See Comments)    "made me like I was having a stoke"  . Lipitor [Atorvastatin] Nausea And Vomiting and Other (See Comments)    "couldn't get out of the bed ( pt had to crawl out of bed ) I hurt so bad"    Current Outpatient Prescriptions  Medication Sig Dispense Refill  . aspirin EC 81 MG tablet Take 1 tablet (81 mg total) by mouth daily. 30 tablet 5  . clopidogrel (PLAVIX) 75 MG tablet TAKE ONE (1) TABLET EACH DAY 30 tablet 5  . lansoprazole (PREVACID) 15 MG capsule Take 1 capsule (15 mg total) by mouth daily. 30 capsule 5  . levothyroxine (SYNTHROID, LEVOTHROID) 112 MCG tablet Take 1 tablet (112 mcg total) by mouth daily. 30 tablet 5  . metoprolol (LOPRESSOR) 50 MG tablet Take 1 tablet (50 mg total) by mouth 2 (two) times daily. 60 tablet 5  . niacin 500 MG tablet Take 1,500 mg by mouth daily.     . nitroGLYCERIN (NITROSTAT) 0.4 MG SL tablet Place 1 tablet (0.4 mg total) under the tongue every 5 (five) minutes as needed for chest pain. 25 tablet 10  . rosuvastatin (CRESTOR) 20 MG tablet Take 1 tablet (20 mg total) by mouth daily. 30 tablet 5  . tamsulosin (FLOMAX) 0.4 MG CAPS capsule Take 1 capsule (0.4 mg total) by mouth daily. 30 capsule 5  . triamterene-hydrochlorothiazide (MAXZIDE-25) 37.5-25 MG tablet Take 1 tablet by mouth every morning. 30 tablet 5  . Coenzyme Q10-Vitamin E 100-300 MG-UNIT WAFR Take by mouth. Take 1 tablet by mouth daily    . [employer] OMEGA-3 KRILL OIL 500 MG CAPS Take by mouth. Take 1 tablet by mouth daily     No current facility-administered medications for this visit.     Review of Systems : See HPI for pertinent positives and negatives.  Physical Examination  Vitals:   08/17/16 1250 08/17/16 1251 08/17/16 1252  BP: (!) 187/67 (!) 187/67 (!) 180/70   Pulse: (!) 53 (!) 53 (!) 53  Resp: 18    Temp: 97 F (36.1 C)    SpO2: 98%    Weight: 159 lb (72.1 kg)    Height: '5\' 6"'$  (1.676 m)     Body mass index is 25.66 kg/m.  General: WDWN male in NAD GAIT: normal Eyes: PERRLA Pulmonary: Respirations are non-labored, CTAB.  Cardiac: regular rhythm, + murmur.  VASCULAR EXAM Carotid Bruits Right Left  Positive, harsh Positive   Aorta is not palpable. Radial pulses are 2+ palpable and equal.      LE Pulses Right Left   POPLITEAL not palpable  not palpable   POSTERIOR TIBIAL  palpable   palpable    DORSALIS PEDIS  ANTERIOR TIBIAL not palpable  not palpable     Gastrointestinal: soft, nontender, BS WNL, no r/g,reducible ventral hernia.  Musculoskeletal: No muscle atrophy/wasting. M/S 5/5 throughout, Extremities without ischemic changes  Neurologic: A&O X 3; Appropriate Affect, normal sensation,  Speech is normal CN 2-12 intact except is hard of hearing, Pain and light touch intact in extremities, Motor exam as listed above.     Assessment: [name] [name] is a 76 y.o. male who is s/p left carotid endarterectomy in February, 2015 for a 95% left ICA stenosis which was asymptomatic. He had previously undergone right carotid endarterectomy in 2005. He also had a redo of a left subclavian artery stent on 04/05/06. He has no hx of stroke or TIA. He denies tingling, numbness, pain, or cold sensation in either hand/arm.    DATA Today's carotid duplex suggests 60-79% right ICA stenosis and no left ICA stenosis. Velocity of 450 cm/s (was 489 cm/s) noted in the distal innominate artery. Bilateral vertebral artery flow is antegrade.  I discussed with Dr. Scot Dock pt's carotid duplex results of today, particularly pt's asymptomatic innominate artery  stenosis; see Plan.  I advised pt to consult Dr. Karin Golden or Dr. Johnsie Cancel as soon as possible re his uncontrolled hypertension.    Plan:  Follow-up in 6 months with Carotid Duplex scan.  I discussed in depth with the patient the nature of atherosclerosis, and emphasized the importance of maximal medical management including strict control of blood pressure, blood glucose, and lipid levels, obtaining regular exercise, and continued cessation of smoking.  The patient is aware that without maximal medical management the underlying atherosclerotic disease process will progress, limiting the benefit of any interventions. The patient was given information about stroke prevention and what symptoms should prompt the patient to seek immediate medical care. Thank you for allowing [place] to participate in this patient's care.  Clemon Chambers, RN, MSN, FNP-C [facility] Office: [phone]  Clinic Physician: Scot Dock  08/17/16 1:00 PM

## 2016-08-17 NOTE — Progress Notes (Signed)
Vitals:   08/17/16 1250 08/17/16 1251  BP: (!) 187/67 (!) 187/67  Pulse: (!) 53 (!) 53  Resp: 18   Temp: 97 F (36.1 C)   SpO2: 98%   Weight: 159 lb (72.1 kg)   Height: '5\' 6"'$  (1.676 m)

## 2016-08-17 NOTE — Patient Instructions (Signed)
Preventing Cerebrovascular Disease Arteries are blood vessels that carry blood that contains oxygen from the heart to all parts of the body. Cerebrovascular disease affects arteries that supply the brain. Any condition that blocks or disrupts blood flow to the brain can cause cerebrovascular disease. Brain cells that lose blood supply start to die within minutes (stroke). Stroke is the main danger of cerebrovascular disease. Atherosclerosis and high blood pressure are common causes of cerebrovascular disease. Atherosclerosis is narrowing and hardening of an artery that results when fat, cholesterol, calcium, or other substances (plaque) build up inside an artery. Plaque reduces blood flow through the artery. High blood pressure increases the risk of bleeding inside the brain. Making diet and lifestyle changes to prevent atherosclerosis and high blood pressure lowers your risk of cerebrovascular disease. What nutrition changes can be made?  Eat more fruits, vegetables, and whole grains.  Reduce how much saturated fat you eat. To do this, eat less red meat and fewer full-fat dairy products.  Eat healthy proteins instead of red meat. Healthy proteins include:  Fish. Eat fish that contains heart-healthy omega-3 fatty acids, twice a week. Examples include salmon, albacore tuna, mackerel, and herring.  Chicken.  Nuts.  Low-fat or nonfat yogurt.  Avoid processed meats, like bacon and lunchmeat.  Avoid foods that contain:  A lot of sugar, such as sweets and drinks with added sugar.  A lot of salt (sodium). Avoid adding extra salt to your food, as told by your health care provider.  Trans fats, such as margarine and baked goods. Trans fats may be listed as "partially hydrogenated oils" on food labels.  Check food labels to see how much sodium, sugar, and trans fats are in foods.  Use vegetable oils that contain low amounts of saturated fat, such as olive oil or canola oil. What lifestyle  changes can be made?  Drink alcohol in moderation. This means no more than 1 drink a day for nonpregnant women and 2 drinks a day for men. One drink equals 12 oz of beer, 5 oz of wine, or 1 oz of hard liquor.  If you are overweight, ask your health care provider to recommend a weight-loss plan for you. Losing 5-10 lb (2.2-4.5 kg) can reduce your risk of diabetes, atherosclerosis, and high blood pressure.  Exercise for 30?60 minutes on most days, or as much as told by your health care provider.  Do moderate-intensity exercise, such as brisk walking, bicycling, and water aerobics. Ask your health care provider which activities are safe for you.  Do not use any products that contain nicotine or tobacco, such as cigarettes and e-cigarettes. If you need help quitting, ask your health care provider. Why are these changes important? Making these changes lowers your risk of many diseases that can cause cerebrovascular disease and stroke. Stroke is a leading cause of death and disability. Making these changes also improves your overall health and quality of life. What can I do to lower my risk? The following factors make you more likely to develop cerebrovascular disease:  Being overweight.  Smoking.  Being physically inactive.  Eating a high-fat diet.  Having certain health conditions, such as:  Diabetes.  High blood pressure.  Heart disease.  Atherosclerosis.  High cholesterol.  Sickle cell disease. Talk with your health care provider about your risk for cerebrovascular disease. Work with your health care provider to control diseases that you have that may contribute to cerebrovascular disease. Your health care provider may prescribe medicines to help prevent major  causes of cerebrovascular disease. Where to find more information: Learn more about preventing cerebrovascular disease from:  [place], Lung, and [place]:  Mo[employer]st.de  Centers for Disease Control and Prevention: http://www.curry-wood.biz/ Summary  Cerebrovascular disease can lead to a stroke.  Atherosclerosis and high blood pressure are major causes of cerebrovascular disease.  Making diet and lifestyle changes can reduce your risk of cerebrovascular disease.  Work with your health care provider to get your risk factors under control to reduce your risk of cerebrovascular disease. This information is not intended to replace advice given to you by your health care provider. Make sure you discuss any questions you have with your health care provider. Document Released: July 23, 202017 Document Revised: 12/25/2015 Document Reviewed: July 23, 202017 Elsevier Interactive Patient Education  2017 [employer].

## 2016-08-18 ENCOUNTER — Ambulatory Visit (INDEPENDENT_AMBULATORY_CARE_PROVIDER_SITE_OTHER): Payer: Medicare Other | Admitting: Family Medicine

## 2016-08-18 ENCOUNTER — Encounter: Payer: Self-pay | Admitting: Family Medicine

## 2016-08-18 VITALS — BP 132/76 | Ht 67.0 in | Wt 158.4 lb

## 2016-08-18 DIAGNOSIS — I6529 Occlusion and stenosis of unspecified carotid artery: Secondary | ICD-10-CM

## 2016-08-18 DIAGNOSIS — I10 Essential (primary) hypertension: Secondary | ICD-10-CM

## 2016-08-18 NOTE — Progress Notes (Signed)
   Subjective:    Patient ID: [name] [name], male    DOB: October 14, 1940, 76 y.o.   MRN: [id number]  Hypertension  This is a chronic problem. The current episode started more than 1 year ago.   Patient in today for elevated blood pressure. Patient states blood pressure the other day was 168/80.  Blood pressures over the past year were reviewed. Overall good when checked here and elsewhere. Were considerably elevated yesterday. Next  Patient claims compliance with blood pressure medications.  Patient did note he is feeling quite anxious yesterday when his pressure was being checked.  States no other concerns this visit.    Review of Systems No headache, no major weight loss or weight gain, no chest pain no back pain abdominal pain no change in bowel habits complete ROS otherwise negative          Objective:   Physical Exam Alert vitals stable, NAD. Blood pressure good on repeat. HEENT normal. Lungs clear. Heart regular rate and rhythm. Blood pressure on repeat 142/80 on right 138/78 on left       Assessment & Plan:  Impression hypertension discussed numbers not perfect but overall good control. Would prefer not to increase medicine increased risk of side effects etc. Due to see again later this spring. If continues to rise may need to add additional medicine

## 2016-08-22 ENCOUNTER — Encounter (HOSPITAL_COMMUNITY): Payer: Self-pay | Admitting: Emergency Medicine

## 2016-08-22 ENCOUNTER — Emergency Department (HOSPITAL_COMMUNITY): Payer: Medicare Other

## 2016-08-22 ENCOUNTER — Inpatient Hospital Stay (HOSPITAL_COMMUNITY)
Admission: EM | Admit: 2016-08-22 | Discharge: 2016-08-31 | DRG: 519 | Disposition: A | Discharge status: Rehabilitation Facility | Payer: Medicare Other | Attending: General Surgery | Admitting: General Surgery

## 2016-08-22 DIAGNOSIS — T1490XA Injury, unspecified, initial encounter: Secondary | ICD-10-CM | POA: Diagnosis not present

## 2016-08-22 DIAGNOSIS — Z801 Family history of malignant neoplasm of trachea, bronchus and lung: Secondary | ICD-10-CM

## 2016-08-22 DIAGNOSIS — G479 Sleep disorder, unspecified: Secondary | ICD-10-CM | POA: Diagnosis not present

## 2016-08-22 DIAGNOSIS — E7800 Pure hypercholesterolemia, unspecified: Secondary | ICD-10-CM | POA: Diagnosis present

## 2016-08-22 DIAGNOSIS — W19XXXA Unspecified fall, initial encounter: Secondary | ICD-10-CM | POA: Diagnosis present

## 2016-08-22 DIAGNOSIS — M259 Joint disorder, unspecified: Secondary | ICD-10-CM | POA: Diagnosis present

## 2016-08-22 DIAGNOSIS — Y95 Nosocomial condition: Secondary | ICD-10-CM | POA: Diagnosis present

## 2016-08-22 DIAGNOSIS — D696 Thrombocytopenia, unspecified: Secondary | ICD-10-CM | POA: Diagnosis not present

## 2016-08-22 DIAGNOSIS — G936 Cerebral edema: Secondary | ICD-10-CM | POA: Diagnosis present

## 2016-08-22 DIAGNOSIS — S32811A Multiple fractures of pelvis with unstable disruption of pelvic ring, initial encounter for closed fracture: Secondary | ICD-10-CM | POA: Diagnosis not present

## 2016-08-22 DIAGNOSIS — S3681XA Injury of peritoneum, initial encounter: Secondary | ICD-10-CM | POA: Diagnosis not present

## 2016-08-22 DIAGNOSIS — Z951 Presence of aortocoronary bypass graft: Secondary | ICD-10-CM

## 2016-08-22 DIAGNOSIS — Z8249 Family history of ischemic heart disease and other diseases of the circulatory system: Secondary | ICD-10-CM

## 2016-08-22 DIAGNOSIS — Z87891 Personal history of nicotine dependence: Secondary | ICD-10-CM

## 2016-08-22 DIAGNOSIS — Z955 Presence of coronary angioplasty implant and graft: Secondary | ICD-10-CM | POA: Diagnosis not present

## 2016-08-22 DIAGNOSIS — I480 Paroxysmal atrial fibrillation: Secondary | ICD-10-CM | POA: Diagnosis not present

## 2016-08-22 DIAGNOSIS — C779 Secondary and unspecified malignant neoplasm of lymph node, unspecified: Secondary | ICD-10-CM | POA: Diagnosis present

## 2016-08-22 DIAGNOSIS — I959 Hypotension, unspecified: Secondary | ICD-10-CM | POA: Diagnosis not present

## 2016-08-22 DIAGNOSIS — I739 Peripheral vascular disease, unspecified: Secondary | ICD-10-CM

## 2016-08-22 DIAGNOSIS — J189 Pneumonia, unspecified organism: Secondary | ICD-10-CM | POA: Diagnosis present

## 2016-08-22 DIAGNOSIS — E861 Hypovolemia: Secondary | ICD-10-CM | POA: Diagnosis not present

## 2016-08-22 DIAGNOSIS — Z7982 Long term (current) use of aspirin: Secondary | ICD-10-CM

## 2016-08-22 DIAGNOSIS — S334XXA Traumatic rupture of symphysis pubis, initial encounter: Secondary | ICD-10-CM | POA: Diagnosis present

## 2016-08-22 DIAGNOSIS — Z96651 Presence of right artificial knee joint: Secondary | ICD-10-CM | POA: Diagnosis present

## 2016-08-22 DIAGNOSIS — C3490 Malignant neoplasm of unspecified part of unspecified bronchus or lung: Secondary | ICD-10-CM | POA: Diagnosis present

## 2016-08-22 DIAGNOSIS — R310 Gross hematuria: Secondary | ICD-10-CM | POA: Diagnosis not present

## 2016-08-22 DIAGNOSIS — K661 Hemoperitoneum: Secondary | ICD-10-CM | POA: Diagnosis not present

## 2016-08-22 DIAGNOSIS — S329XXA Fracture of unspecified parts of lumbosacral spine and pelvis, initial encounter for closed fracture: Secondary | ICD-10-CM | POA: Diagnosis not present

## 2016-08-22 DIAGNOSIS — R402412 Glasgow coma scale score 13-15, at arrival to emergency department: Secondary | ICD-10-CM | POA: Diagnosis present

## 2016-08-22 DIAGNOSIS — M5489 Other dorsalgia: Secondary | ICD-10-CM | POA: Diagnosis not present

## 2016-08-22 DIAGNOSIS — E039 Hypothyroidism, unspecified: Secondary | ICD-10-CM | POA: Diagnosis not present

## 2016-08-22 DIAGNOSIS — E876 Hypokalemia: Secondary | ICD-10-CM | POA: Diagnosis not present

## 2016-08-22 DIAGNOSIS — N5089 Other specified disorders of the male genital organs: Secondary | ICD-10-CM | POA: Diagnosis not present

## 2016-08-22 DIAGNOSIS — E871 Hypo-osmolality and hyponatremia: Secondary | ICD-10-CM | POA: Diagnosis present

## 2016-08-22 DIAGNOSIS — W11XXXA Fall on and from ladder, initial encounter: Secondary | ICD-10-CM | POA: Diagnosis present

## 2016-08-22 DIAGNOSIS — K5903 Drug induced constipation: Secondary | ICD-10-CM | POA: Diagnosis not present

## 2016-08-22 DIAGNOSIS — N189 Chronic kidney disease, unspecified: Secondary | ICD-10-CM | POA: Diagnosis present

## 2016-08-22 DIAGNOSIS — I82433 Acute embolism and thrombosis of popliteal vein, bilateral: Secondary | ICD-10-CM | POA: Diagnosis not present

## 2016-08-22 DIAGNOSIS — E8770 Fluid overload, unspecified: Secondary | ICD-10-CM | POA: Diagnosis present

## 2016-08-22 DIAGNOSIS — I2510 Atherosclerotic heart disease of native coronary artery without angina pectoris: Secondary | ICD-10-CM | POA: Diagnosis not present

## 2016-08-22 DIAGNOSIS — G8918 Other acute postprocedural pain: Secondary | ICD-10-CM

## 2016-08-22 DIAGNOSIS — R32 Unspecified urinary incontinence: Secondary | ICD-10-CM | POA: Diagnosis not present

## 2016-08-22 DIAGNOSIS — E869 Volume depletion, unspecified: Secondary | ICD-10-CM | POA: Diagnosis present

## 2016-08-22 DIAGNOSIS — R5381 Other malaise: Secondary | ICD-10-CM | POA: Diagnosis not present

## 2016-08-22 DIAGNOSIS — R8290 Unspecified abnormal findings in urine: Secondary | ICD-10-CM | POA: Diagnosis not present

## 2016-08-22 DIAGNOSIS — G839 Paralytic syndrome, unspecified: Secondary | ICD-10-CM | POA: Diagnosis not present

## 2016-08-22 DIAGNOSIS — I82403 Acute embolism and thrombosis of unspecified deep veins of lower extremity, bilateral: Secondary | ICD-10-CM | POA: Diagnosis present

## 2016-08-22 DIAGNOSIS — I10 Essential (primary) hypertension: Secondary | ICD-10-CM | POA: Diagnosis not present

## 2016-08-22 DIAGNOSIS — G939 Disorder of brain, unspecified: Secondary | ICD-10-CM | POA: Diagnosis present

## 2016-08-22 DIAGNOSIS — R918 Other nonspecific abnormal finding of lung field: Secondary | ICD-10-CM

## 2016-08-22 DIAGNOSIS — R339 Retention of urine, unspecified: Secondary | ICD-10-CM | POA: Diagnosis not present

## 2016-08-22 DIAGNOSIS — J449 Chronic obstructive pulmonary disease, unspecified: Secondary | ICD-10-CM | POA: Diagnosis present

## 2016-08-22 DIAGNOSIS — M25359 Other instability, unspecified hip: Secondary | ICD-10-CM | POA: Diagnosis not present

## 2016-08-22 DIAGNOSIS — I129 Hypertensive chronic kidney disease with stage 1 through stage 4 chronic kidney disease, or unspecified chronic kidney disease: Secondary | ICD-10-CM | POA: Diagnosis present

## 2016-08-22 DIAGNOSIS — D72829 Elevated white blood cell count, unspecified: Secondary | ICD-10-CM | POA: Diagnosis not present

## 2016-08-22 DIAGNOSIS — Z5181 Encounter for therapeutic drug level monitoring: Secondary | ICD-10-CM | POA: Diagnosis not present

## 2016-08-22 DIAGNOSIS — R609 Edema, unspecified: Secondary | ICD-10-CM | POA: Diagnosis not present

## 2016-08-22 DIAGNOSIS — K5730 Diverticulosis of large intestine without perforation or abscess without bleeding: Secondary | ICD-10-CM | POA: Diagnosis present

## 2016-08-22 DIAGNOSIS — S301XXA Contusion of abdominal wall, initial encounter: Secondary | ICD-10-CM | POA: Diagnosis not present

## 2016-08-22 DIAGNOSIS — Z96652 Presence of left artificial knee joint: Secondary | ICD-10-CM | POA: Diagnosis present

## 2016-08-22 DIAGNOSIS — D62 Acute posthemorrhagic anemia: Secondary | ICD-10-CM | POA: Diagnosis not present

## 2016-08-22 DIAGNOSIS — R911 Solitary pulmonary nodule: Secondary | ICD-10-CM | POA: Diagnosis not present

## 2016-08-22 DIAGNOSIS — I25810 Atherosclerosis of coronary artery bypass graft(s) without angina pectoris: Secondary | ICD-10-CM | POA: Diagnosis not present

## 2016-08-22 DIAGNOSIS — Z86010 Personal history of colonic polyps: Secondary | ICD-10-CM

## 2016-08-22 DIAGNOSIS — Z419 Encounter for procedure for purposes other than remedying health state, unspecified: Secondary | ICD-10-CM

## 2016-08-22 DIAGNOSIS — R0602 Shortness of breath: Secondary | ICD-10-CM | POA: Diagnosis not present

## 2016-08-22 DIAGNOSIS — R590 Localized enlarged lymph nodes: Secondary | ICD-10-CM | POA: Diagnosis not present

## 2016-08-22 DIAGNOSIS — Z833 Family history of diabetes mellitus: Secondary | ICD-10-CM

## 2016-08-22 DIAGNOSIS — Z888 Allergy status to other drugs, medicaments and biological substances status: Secondary | ICD-10-CM

## 2016-08-22 DIAGNOSIS — T148XXA Other injury of unspecified body region, initial encounter: Secondary | ICD-10-CM | POA: Diagnosis not present

## 2016-08-22 DIAGNOSIS — S32810D Multiple fractures of pelvis with stable disruption of pelvic ring, subsequent encounter for fracture with routine healing: Secondary | ICD-10-CM | POA: Diagnosis not present

## 2016-08-22 DIAGNOSIS — I82439 Acute embolism and thrombosis of unspecified popliteal vein: Secondary | ICD-10-CM | POA: Diagnosis present

## 2016-08-22 DIAGNOSIS — K5901 Slow transit constipation: Secondary | ICD-10-CM | POA: Diagnosis not present

## 2016-08-22 DIAGNOSIS — J90 Pleural effusion, not elsewhere classified: Secondary | ICD-10-CM | POA: Diagnosis not present

## 2016-08-22 DIAGNOSIS — I252 Old myocardial infarction: Secondary | ICD-10-CM

## 2016-08-22 DIAGNOSIS — N390 Urinary tract infection, site not specified: Secondary | ICD-10-CM | POA: Diagnosis not present

## 2016-08-22 DIAGNOSIS — E46 Unspecified protein-calorie malnutrition: Secondary | ICD-10-CM | POA: Diagnosis present

## 2016-08-22 DIAGNOSIS — S3991XA Unspecified injury of abdomen, initial encounter: Secondary | ICD-10-CM | POA: Diagnosis not present

## 2016-08-22 DIAGNOSIS — K219 Gastro-esophageal reflux disease without esophagitis: Secondary | ICD-10-CM | POA: Diagnosis present

## 2016-08-22 DIAGNOSIS — S32810A Multiple fractures of pelvis with stable disruption of pelvic ring, initial encounter for closed fracture: Principal | ICD-10-CM

## 2016-08-22 DIAGNOSIS — I4891 Unspecified atrial fibrillation: Secondary | ICD-10-CM | POA: Diagnosis present

## 2016-08-22 DIAGNOSIS — C7931 Secondary malignant neoplasm of brain: Secondary | ICD-10-CM | POA: Diagnosis present

## 2016-08-22 DIAGNOSIS — Z9049 Acquired absence of other specified parts of digestive tract: Secondary | ICD-10-CM

## 2016-08-22 DIAGNOSIS — E785 Hyperlipidemia, unspecified: Secondary | ICD-10-CM | POA: Diagnosis present

## 2016-08-22 DIAGNOSIS — Z7902 Long term (current) use of antithrombotics/antiplatelets: Secondary | ICD-10-CM | POA: Diagnosis not present

## 2016-08-22 DIAGNOSIS — S3993XA Unspecified injury of pelvis, initial encounter: Secondary | ICD-10-CM | POA: Diagnosis not present

## 2016-08-22 DIAGNOSIS — R410 Disorientation, unspecified: Secondary | ICD-10-CM | POA: Diagnosis not present

## 2016-08-22 DIAGNOSIS — S199XXA Unspecified injury of neck, initial encounter: Secondary | ICD-10-CM | POA: Diagnosis not present

## 2016-08-22 DIAGNOSIS — M810 Age-related osteoporosis without current pathological fracture: Secondary | ICD-10-CM | POA: Diagnosis present

## 2016-08-22 DIAGNOSIS — I701 Atherosclerosis of renal artery: Secondary | ICD-10-CM | POA: Diagnosis not present

## 2016-08-22 DIAGNOSIS — M549 Dorsalgia, unspecified: Secondary | ICD-10-CM | POA: Diagnosis not present

## 2016-08-22 DIAGNOSIS — S329XXD Fracture of unspecified parts of lumbosacral spine and pelvis, subsequent encounter for fracture with routine healing: Secondary | ICD-10-CM | POA: Diagnosis not present

## 2016-08-22 DIAGNOSIS — S41109A Unspecified open wound of unspecified upper arm, initial encounter: Secondary | ICD-10-CM | POA: Diagnosis not present

## 2016-08-22 DIAGNOSIS — S32810S Multiple fractures of pelvis with stable disruption of pelvic ring, sequela: Secondary | ICD-10-CM | POA: Diagnosis not present

## 2016-08-22 DIAGNOSIS — W11XXXD Fall on and from ladder, subsequent encounter: Secondary | ICD-10-CM | POA: Diagnosis present

## 2016-08-22 DIAGNOSIS — I700 Atherosclerosis of aorta: Secondary | ICD-10-CM | POA: Diagnosis not present

## 2016-08-22 DIAGNOSIS — W19XXXD Unspecified fall, subsequent encounter: Secondary | ICD-10-CM | POA: Diagnosis not present

## 2016-08-22 DIAGNOSIS — S0990XA Unspecified injury of head, initial encounter: Secondary | ICD-10-CM | POA: Diagnosis not present

## 2016-08-22 DIAGNOSIS — M7989 Other specified soft tissue disorders: Secondary | ICD-10-CM | POA: Diagnosis not present

## 2016-08-22 DIAGNOSIS — W1789XA Other fall from one level to another, initial encounter: Secondary | ICD-10-CM

## 2016-08-22 HISTORY — DX: Joint disorder, unspecified: M25.9

## 2016-08-22 LAB — CDS SEROLOGY

## 2016-08-22 LAB — CBC
HCT: 35 % — ABNORMAL LOW (ref 39.0–52.0)
HCT: 35.8 % — ABNORMAL LOW (ref 39.0–52.0)
Hemoglobin: 12.2 g/dL — ABNORMAL LOW (ref 13.0–17.0)
Hemoglobin: 12.5 g/dL — ABNORMAL LOW (ref 13.0–17.0)
MCH: 30.7 pg (ref 26.0–34.0)
MCH: 30.1 pg (ref 26.0–34.0)
MCHC: 34.9 g/dL (ref 30.0–36.0)
MCHC: 34.9 g/dL (ref 30.0–36.0)
MCV: 87.9 fL (ref 78.0–100.0)
MCV: 86.3 fL (ref 78.0–100.0)
PLATELETS: 154 10*3/uL (ref 150–400)
Platelets: 94 K/uL — ABNORMAL LOW (ref 150–400)
RBC: 3.98 MIL/uL — AB (ref 4.22–5.81)
RBC: 4.15 MIL/uL — ABNORMAL LOW (ref 4.22–5.81)
RDW: 13.7 % (ref 11.5–15.5)
RDW: 14 % (ref 11.5–15.5)
WBC: 17.9 10*3/uL — AB (ref 4.0–10.5)
WBC: 10.2 K/uL (ref 4.0–10.5)

## 2016-08-22 LAB — CBC WITH DIFFERENTIAL/PLATELET
BASOS ABS: 0 10*3/uL (ref 0.0–0.1)
Basophils Relative: 0 %
Eosinophils Absolute: 0 10*3/uL (ref 0.0–0.7)
Eosinophils Relative: 0 %
HEMATOCRIT: 31.4 % — AB (ref 39.0–52.0)
Hemoglobin: 10.8 g/dL — ABNORMAL LOW (ref 13.0–17.0)
LYMPHS ABS: 0.6 10*3/uL — AB (ref 0.7–4.0)
LYMPHS PCT: 4 %
MCH: 30.1 pg (ref 26.0–34.0)
MCHC: 34.4 g/dL (ref 30.0–36.0)
MCV: 87.5 fL (ref 78.0–100.0)
Monocytes Absolute: 0.6 10*3/uL (ref 0.1–1.0)
Monocytes Relative: 5 %
NEUTROS ABS: 12.3 10*3/uL — AB (ref 1.7–7.7)
Neutrophils Relative %: 91 %
Platelets: 124 10*3/uL — ABNORMAL LOW (ref 150–400)
RBC: 3.59 MIL/uL — AB (ref 4.22–5.81)
RDW: 13.7 % (ref 11.5–15.5)
WBC: 13.5 10*3/uL — AB (ref 4.0–10.5)

## 2016-08-22 LAB — URINALYSIS, ROUTINE W REFLEX MICROSCOPIC
GLUCOSE, UA: NEGATIVE mg/dL
Squamous Epithelial / LPF: NONE SEEN
WBC, UA: NONE SEEN WBC/hpf (ref 0–5)

## 2016-08-22 LAB — I-STAT CG4 LACTIC ACID, ED: Lactic Acid, Venous: 2.78 mmol/L (ref 0.5–1.9)

## 2016-08-22 LAB — COMPREHENSIVE METABOLIC PANEL
ALT: 24 U/L (ref 17–63)
AST: 31 U/L (ref 15–41)
Albumin: 3.2 g/dL — ABNORMAL LOW (ref 3.5–5.0)
Alkaline Phosphatase: 60 U/L (ref 38–126)
Anion gap: 7 (ref 5–15)
BUN: 18 mg/dL (ref 6–20)
CHLORIDE: 107 mmol/L (ref 101–111)
CO2: 24 mmol/L (ref 22–32)
CREATININE: 0.96 mg/dL (ref 0.61–1.24)
Calcium: 8.5 mg/dL — ABNORMAL LOW (ref 8.9–10.3)
GFR calc Af Amer: >60 mL/min (ref >60)
Glucose, Bld: 151 mg/dL — ABNORMAL HIGH (ref 65–99)
Potassium: 4 mmol/L (ref 3.5–5.1)
Sodium: 138 mmol/L (ref 135–145)
TOTAL PROTEIN: 5.1 g/dL — AB (ref 6.5–8.1)
Total Bilirubin: 0.6 mg/dL (ref 0.3–1.2)

## 2016-08-22 LAB — I-STAT CHEM 8, ED
BUN: 20 mg/dL (ref 6–20)
CREATININE: 0.9 mg/dL (ref 0.61–1.24)
Calcium, Ion: 1.14 mmol/L — ABNORMAL LOW (ref 1.15–1.40)
Chloride: 103 mmol/L (ref 101–111)
Glucose, Bld: 143 mg/dL — ABNORMAL HIGH (ref 65–99)
HEMATOCRIT: 33 % — AB (ref 39.0–52.0)
HEMOGLOBIN: 11.2 g/dL — AB (ref 13.0–17.0)
POTASSIUM: 3.7 mmol/L (ref 3.5–5.1)
Sodium: 140 mmol/L (ref 135–145)
TCO2: 23 mmol/L (ref 0–100)

## 2016-08-22 LAB — APTT: aPTT: 28 seconds (ref 24–36)

## 2016-08-22 LAB — PROTIME-INR
INR: 1.17
INR: 1.36
Prothrombin Time: 15 seconds (ref 11.4–15.2)
Prothrombin Time: 16.9 s — ABNORMAL HIGH (ref 11.4–15.2)

## 2016-08-22 LAB — MRSA PCR SCREENING: MRSA by PCR: NEGATIVE

## 2016-08-22 LAB — PREPARE RBC (CROSSMATCH)

## 2016-08-22 LAB — ETHANOL

## 2016-08-22 MED ORDER — HYDROMORPHONE HCL 1 MG/ML IJ SOLN: 0.5000 mg | INTRAMUSCULAR | Status: DC | PRN

## 2016-08-22 MED ORDER — ONDANSETRON HCL 4 MG PO TABS: 4.0000 mg | ORAL_TABLET | Freq: Four times a day (QID) | ORAL | Status: DC | PRN

## 2016-08-22 MED ORDER — HYDROMORPHONE HCL 2 MG/ML IJ SOLN: 0.5000 mg | INTRAMUSCULAR | Status: DC | PRN

## 2016-08-22 MED ORDER — ONDANSETRON HCL 4 MG/2ML IJ SOLN: 4.0000 mg | Freq: Four times a day (QID) | INTRAMUSCULAR | Status: DC | PRN

## 2016-08-22 MED ORDER — SODIUM CHLORIDE 0.9 % IV BOLUS (SEPSIS)
1000.0000 mL | Freq: Once | INTRAVENOUS | Status: AC
Start: 2016-08-22 — End: 2016-08-22
  Administered 2016-08-22: 1000 mL via INTRAVENOUS

## 2016-08-22 MED ORDER — PANTOPRAZOLE SODIUM 40 MG IV SOLR
40.0000 mg | Freq: Every day | INTRAVENOUS | Status: DC
  Administered 2016-08-23: 40 mg via INTRAVENOUS
  Filled 2016-08-22: qty 40

## 2016-08-22 MED ORDER — PANTOPRAZOLE SODIUM 40 MG PO TBEC
40.0000 mg | DELAYED_RELEASE_TABLET | Freq: Every day | ORAL | Status: DC
  Administered 2016-08-24 – 2016-08-28 (×4): 40 mg via ORAL
  Filled 2016-08-22 (×4): qty 1

## 2016-08-22 MED ORDER — IOPAMIDOL (ISOVUE-300) INJECTION 61%
INTRAVENOUS | Status: AC
  Filled 2016-08-22: qty 100

## 2016-08-22 MED ORDER — SODIUM CHLORIDE 0.9 % IV SOLN
INTRAVENOUS | Status: DC
  Administered 2016-08-22 – 2016-08-28 (×12): via INTRAVENOUS

## 2016-08-22 MED ORDER — ACETAMINOPHEN 325 MG PO TABS
650.0000 mg | ORAL_TABLET | Freq: Four times a day (QID) | ORAL | Status: DC | PRN
  Administered 2016-08-22 – 2016-08-26 (×10): 650 mg via ORAL
  Filled 2016-08-22 (×10): qty 2

## 2016-08-22 NOTE — ED Notes (Signed)
2nd unit [facility] unit # [id number] started at 16:31, complete at 16:35. 2nd unit [facility] emergency release started at 16:35 [id number]

## 2016-08-22 NOTE — Progress Notes (Signed)
   Met w/ family in ED consult room B.  Introduced Art gallery manager.  Will follow, as needed.  - Rev. [place] MDiv ThM

## 2016-08-22 NOTE — ED Notes (Signed)
Spoke with provider and charge nurse call Level 1 trauma.

## 2016-08-22 NOTE — ED Notes (Signed)
Pelvic binder applied by MD Hulen Skains.

## 2016-08-22 NOTE — ED Provider Notes (Signed)
Pt is a 76 y.o. male who presents with  Chief Complaint  Patient presents with  . Fall  Patient presented to the emergency room after falling off a 12 foot ladder when he was trying to do something with his gutters. Patient states the ladder slipped and he fell to the ground. 2 bricks steps. He denies any loss of consciousness. He is complaining of pain in his right flank and lower back. He denies headache or chest pain. No shortness of breath. No numbness or weakness. He does have severe pain that is sharp when he tries to move. At rest the pain is not so bad. Patient does take Plavix but no other anticoagulants.  Physical Exam  Constitutional: No distress.  HENT:  Head: Normocephalic and atraumatic.  Eyes: Conjunctivae are normal. Left eye exhibits no discharge. No scleral icterus.  Neck: No tracheal deviation present. No thyromegaly present.  Pulmonary/Chest: Effort normal and breath sounds normal. No stridor. He exhibits no tenderness and no bony tenderness.  Abdominal: He exhibits no distension. There is no tenderness. There is no rebound and no CVA tenderness.  Musculoskeletal: He exhibits no deformity.       Right hip: Normal.       Left hip: Normal.       Cervical back: Normal.       Thoracic back: Normal.       Lumbar back: He exhibits tenderness, bony tenderness and swelling.  Ecchymoses and hematoma noted in the left flank  Neurological: He is alert.  Skin: Skin is warm. No rash noted. He is not diaphoretic. No erythema.  Psychiatric: Affect normal.    Clinical Course as of Aug 23 1527  Mon Aug 22, 2016  1502 Dr Tomi Bamberger made aware of patient, currently with patient.    [EW]  1524 I was called to bedside to evaluate the patient after he was noted that he was hypotensive in the ED. I activated level I trauma considering his hypotension.  In the process of performing a limited FAST exam when Dr. Hulen Skains arrived at the bedside .  Patient moved to the trauma room. I do not see any  signs of free fluid in the left upper quadrant or right upper quadrant views.  Dr Hulen Skains , trauma surgery will continue his care.  [JK]    Clinical Course User Index [EW] Clayton Bibles, PA-C [JK] Dorie Rank, MD     Trauma  Hypotension, unspecified hypotension type  Fall from height of greater than 3 feet   Medical screening examination/treatment/procedure(s) were conducted as a shared visit with non-physician practitioner(s) and myself.  I personally evaluated the patient during the encounter.       Dorie Rank, MD 08/22/16 [phone]

## 2016-08-22 NOTE — ED Notes (Signed)
2nd unit [facility]FP complete

## 2016-08-22 NOTE — ED Notes (Signed)
[facility] unit [id number] complete.

## 2016-08-22 NOTE — ED Notes (Signed)
Ortho responded to Dr. Hulen Skains at [phone] via phone call.

## 2016-08-22 NOTE — ED Notes (Signed)
Fast exam performed by EDP.

## 2016-08-22 NOTE — Consult Note (Signed)
[facility] Surgery Consult/Admission Note  [name] [name] [birth year]  [id number].    Requesting MD: Tomi Bamberger, MD Chief Complaint/Reason for Consult: fall, hypotension  HPI:  76 y/o white male with PMH HTN, HLD, a.fib, and PVD who presented to [facility] via EMS from [place], [place] after a fall from a ladder. Patient was on a ladder attempting to clean out his stove pipe when the ladder became unstable and he fell. He denies LOC and called for his wife immediately. Upon arrival to ED he was hemodynamically stable and GCS 15. He became hypotensive and trauma team was called. Patient is a former smoker who quit >20 years ago. Denies alcohol use. Patient is on Plavix. He has a history of multiple surgeries for complications relation to his PVD including surgery for bilateral renal artery stenosis (2015), CABG (1995), carotid endarterectomy (2015).   ROS: Review of Systems  Constitutional: Negative.  Negative for chills and fever.  HENT: Negative for ear pain, hearing loss and tinnitus.   Eyes: Negative for blurred vision and pain.  Cardiovascular: Negative for chest pain and palpitations.  Gastrointestinal: Negative for abdominal pain, nausea and vomiting.  Genitourinary: Negative for dysuria, flank pain, frequency and urgency.  Musculoskeletal: Positive for back pain.  Neurological: Negative for dizziness, focal weakness, seizures and headaches.    Family History  Problem Relation Age of Onset  . Hypertension Mother   . Heart disease Father   . Heart attack Father   . Cancer Brother     Lung  . Diabetes Sister     Past Medical History:  Diagnosis Date  . Atrial fibrillation ([place])    no hx of reported at preop visit of 04/21/11   . CAD (coronary artery disease)   . Carotid artery occlusion    left carotid endarterectomy  . Chronic kidney disease    AAA repair with reimplant of renals   . CHRONIC OBSTRUCTIVE PULMONARY DISEASE    pt denied at visit of 04/21/11   . CORONARY ARTERY  DISEASE   . DIVERTICULOSIS OF COLON   . GERD   . H/O hiatal hernia   . Headache(784.0)    Hx: of years of years  . HYPERTENSION   . HYPOTHYROIDISM   . JOINT EFFUSION, KNEE   . KNEE, ARTHRITIS, DEGEN./OSTEO    right knee   . Myocardial infarction    1994   . NEPHROLITHIASIS, HX OF   . OSTEOPOROSIS   . Other dysphagia   . PERIPHERAL VASCULAR DISEASE    AAA - 1994 with reimplant of renals   . Peripheral vascular disease ([place])    subclavian stenosis PTA - 3/08   . Pure hypercholesterolemia   . Renal artery stenosis [facility])     Past Surgical History:  Procedure Laterality Date  . ABDOMINAL AORTIC ANEURYSM REPAIR     with reimplantation of renals   . CARDIAC CATHETERIZATION     2008  . CAROTID ENDARTERECTOMY Left Jan. 30, 2015   CEA  . CHOLECYSTECTOMY  2000   Gall Bladder  . COLONOSCOPY W/ BIOPSIES AND POLYPECTOMY     Hx: of  . CORONARY ARTERY BYPASS GRAFT     1995  . CORONARY STENT PLACEMENT     Hx: of  . ENDARTERECTOMY Left 07/19/2013   Procedure: ENDARTERECTOMY CAROTID;  Surgeon: Mal Misty, MD;  Location: [facility];  Service: Vascular;  Laterality: Left;  . GALLBLADDER SURGERY  2004   . JOINT REPLACEMENT     partial knee replacement on  left 2002   . KNEE SURGERY    . OTHER SURGICAL HISTORY     left subclavian stenosis surgery PTA 08/2006   . OTHER SURGICAL HISTORY     carotid surgery on right 2004   . TOTAL KNEE ARTHROPLASTY  04/29/2011   Procedure: TOTAL KNEE ARTHROPLASTY;  Surgeon: Gearlean Alf;  Location: [facility];  Service: Orthopedics;  Laterality: Right;  [name] VASCULAR SURGERY     AAA  . VASECTOMY  1973    Social History:  reports that he quit smoking about 24 years ago. His smoking use included Cigarettes. He has never used smokeless tobacco. He reports that he does not drink alcohol or use drugs.  Allergies:  Allergies  Allergen Reactions  . Imitrex [Sumatriptan] Nausea And Vomiting and Other (See Comments)    "made me like I was having a stoke"  .  Lipitor [Atorvastatin] Nausea And Vomiting and Other (See Comments)    "couldn't get out of the bed ( pt had to crawl out of bed ) I hurt so bad"     (Not in a hospital admission)  Blood pressure (!) 80/40, pulse (!) 54, temperature 97.5 F (36.4 C), temperature source Oral, resp. rate 16, height '5\' 6"'$  (1.676 m), weight 71.7 kg (158 lb), SpO2 97 %. Physical Exam: Physical Exam  Constitutional: He is oriented to person, place, and time and well-developed, well-nourished, and in no distress. No distress.  HENT:  Head: Normocephalic and atraumatic.  Right Ear: External ear normal.  Left Ear: External ear normal.  Nose: Nose normal.  Mouth/Throat: Oropharynx is clear and moist. No oropharyngeal exudate.  Eyes: Pupils are equal, round, and reactive to light. Right eye exhibits no discharge. Left eye exhibits no discharge. No scleral icterus.  Neck: Normal range of motion. Neck supple. No tracheal deviation present. No thyromegaly present.  Cardiovascular: Normal rate and intact distal pulses.  Exam reveals no gallop and no friction rub.   No murmur heard. Pulmonary/Chest: Effort normal and breath sounds normal. No stridor. No respiratory distress. He has no wheezes. He has no rales. He exhibits no tenderness.  Abdominal: Soft. Bowel sounds are normal. He exhibits no distension and no mass. There is no tenderness. There is no rebound and no guarding.  Musculoskeletal: Normal range of motion. He exhibits no edema or deformity.  Suprapubic tenderness over pubic symphysis   Non-tender over spinous processes, no step-off.  Neurological: He is alert and oriented to person, place, and time. GCS score is 15.  Skin: Skin is warm and dry. No rash noted. He is not diaphoretic. No pallor.  Large contusion over left posterior hip  contusion over central back, just superior to sacrum.  Psychiatric: Affect normal.    Results for orders placed or performed during the hospital encounter of 08/22/16  (from the past 48 hour(s))  Type and screen     Status: None (Preliminary result)   Collection Time: 08/22/16  3:03 PM  Result Value Ref Range   ABO/RH(D) PENDING    Antibody Screen PENDING    Sample Expiration 08/25/2016    Unit Number [id number]    Blood Component Type RBC LR PHER1    Unit division 00    Status of Unit ISSUED    Unit tag comment VERBAL ORDERS PER DR KNAPP    Transfusion Status OK TO TRANSFUSE    Crossmatch Result PENDING    Unit Number [id number]    Blood Component Type RED CELLS,LR    Unit division  00    Status of Unit ISSUED    Unit tag comment VERBAL ORDERS PER DR KNAPP    Transfusion Status OK TO TRANSFUSE    Crossmatch Result PENDING   Prepare fresh frozen plasma     Status: None (Preliminary result)   Collection Time: 08/22/16  3:03 PM  Result Value Ref Range   Unit Number [id number]    Blood Component Type THAWED PLASMA    Unit division 00    Status of Unit ISSUED    Unit tag comment VERBAL ORDERS PER DR KNAPP    Transfusion Status OK TO TRANSFUSE    Unit Number [id number]    Blood Component Type THAWED PLASMA    Unit division 00    Status of Unit ISSUED    Unit tag comment VERBAL ORDERS PER DR KNAPP    Transfusion Status OK TO TRANSFUSE    No results found.   Assessment/Plan Fall from ladder Hypotension - resolved 1L NS  Back pain/contusion - CT Abd/pelvis Suprapubic tenderness - DG pelvis w/ mild diastasis and offset at pubic symphysis, CT pending  Jill Alexanders, [facility] Surgery 08/22/2016, 3:23 PM Pager: [phone] Consults: [phone] Mon-Fri 7:00 am-4:30 pm Sat-Sun 7:00 am-11:30 am    FAST procedure negative as below:   LUQ     RUQ     EPIGASTRIUM   BLADDER   FAStr negative.  Patient admitted to trauma service  This patient has been seen and I agree with the findings and treatment plan.  Kathryne Eriksson. Dahlia Bailiff, MD, [place] [phone] (pager) [phone] (direct pager) Trauma  Surgeon

## 2016-08-22 NOTE — ED Notes (Signed)
First unit of [facility] emergency release started at 1605, ended 1610 - unit # [id number]

## 2016-08-22 NOTE — ED Provider Notes (Signed)
[facility] DEPT Provider Note   CSN: [id number] Arrival date & time: 08/22/16  1443     History   Chief Complaint Chief Complaint  Patient presents with  . Fall    HPI [name] [name] is a 76 y.o. male.  HPI   Pt with hx afib, CAD s/p MI, CKD, AAA, HTN, renal artery stenosis presents with left lower back pain after fall from roof-level.  Reports back pain, denies other pain currently.  Does note he is having difficulty seeing intermittently.  Level V caveat for acuity of condition.    Past Medical History:  Diagnosis Date  . Atrial fibrillation ([place])    no hx of reported at preop visit of 04/21/11   . CAD (coronary artery disease)   . Carotid artery occlusion    left carotid endarterectomy  . Chronic kidney disease    AAA repair with reimplant of renals   . CHRONIC OBSTRUCTIVE PULMONARY DISEASE    pt denied at visit of 04/21/11   . CORONARY ARTERY DISEASE   . DIVERTICULOSIS OF COLON   . GERD   . H/O hiatal hernia   . Headache(784.0)    Hx: of years of years  . HYPERTENSION   . HYPOTHYROIDISM   . JOINT EFFUSION, KNEE   . KNEE, ARTHRITIS, DEGEN./OSTEO    right knee   . Myocardial infarction    1994   . NEPHROLITHIASIS, HX OF   . OSTEOPOROSIS   . Other dysphagia   . PERIPHERAL VASCULAR DISEASE    AAA - 1994 with reimplant of renals   . Peripheral vascular disease ([place])    subclavian stenosis PTA - 3/08   . Pure hypercholesterolemia   . Renal artery stenosis [facility])     Patient Active Problem List   Diagnosis Date Noted  . Carotid artery stenosis, asymptomatic 08/12/2014  . Ejection murmur 07/03/2014  . Aftercare following surgery of the circulatory system, [place] 02/11/2014  . Weakness-Abdomin and Bilateral Thigh / Leg 02/11/2014  . Carotid stenosis 07/19/2013  . Occlusion and stenosis of carotid artery without mention of cerebral infarction 07/16/2013  . Impaired fasting glucose 07/14/2013  . Knee pain 05/23/2011  . Stiffness of joint, not elsewhere  classified, lower leg 05/23/2011  . Muscle weakness (generalized) 05/23/2011  . Difficulty in walking(719.7) 05/23/2011  . Bruit 11/19/2010  . AAA (abdominal aortic aneurysm) ([place]) 11/19/2010  . OTHER DYSPHAGIA 04/27/2010  . ATRIAL FIBRILLATION 12/10/2008  . JOINT EFFUSION, KNEE 01/14/2008  . KNEE PAIN 01/14/2008  . KNEE, ARTHRITIS, DEGEN./OSTEO 12/11/2007  . Hypothyroidism 10/10/2007  . Pure hypercholesterolemia 10/10/2007  . Essential hypertension 10/10/2007  . Coronary atherosclerosis 10/10/2007  . Peripheral vascular disease ([place]) 10/10/2007  . CHRONIC OBSTRUCTIVE PULMONARY DISEASE 10/10/2007  . GERD 10/10/2007  . DIVERTICULOSIS OF COLON 10/10/2007  . OSTEOPOROSIS 10/10/2007  . NEPHROLITHIASIS, HX OF 10/10/2007  . Personal history of surgery to heart and great vessels, presenting hazards to health 10/10/2007  . CHOLECYSTECTOMY, HX OF 10/10/2007  . CORONARY ARTERY BYPASS GRAFT, HX OF 10/10/2007    Past Surgical History:  Procedure Laterality Date  . ABDOMINAL AORTIC ANEURYSM REPAIR     with reimplantation of renals   . CARDIAC CATHETERIZATION     2008  . CAROTID ENDARTERECTOMY Left Jan. 30, 2015   CEA  . CHOLECYSTECTOMY  2000   Gall Bladder  . COLONOSCOPY W/ BIOPSIES AND POLYPECTOMY     Hx: of  . CORONARY ARTERY BYPASS GRAFT     1995  . CORONARY  STENT PLACEMENT     Hx: of  . ENDARTERECTOMY Left 07/19/2013   Procedure: ENDARTERECTOMY CAROTID;  Surgeon: Mal Misty, MD;  Location: [facility];  Service: Vascular;  Laterality: Left;  . GALLBLADDER SURGERY  2004   . JOINT REPLACEMENT     partial knee replacement on left 2002   . KNEE SURGERY    . OTHER SURGICAL HISTORY     left subclavian stenosis surgery PTA 08/2006   . OTHER SURGICAL HISTORY     carotid surgery on right 2004   . TOTAL KNEE ARTHROPLASTY  04/29/2011   Procedure: TOTAL KNEE ARTHROPLASTY;  Surgeon: Gearlean Alf;  [facility];  Service: Orthopedics;  Laterality: Right;  [name] VASCULAR SURGERY     AAA    . [place] Medications    Prior to Admission medications   Medication Sig Start Date End Date Taking? Authorizing Provider  aspirin EC 81 MG tablet Take 1 tablet (81 mg total) by mouth daily. 01/29/15   Kathyrn Drown, MD  clopidogrel (PLAVIX) 75 MG tablet TAKE ONE (1) TABLET EACH DAY 03/23/16   [name], MD  Coenzyme Q10-Vitamin E 100-300 MG-UNIT WAFR Take by mouth. Take 1 tablet by mouth daily    Historical Provider, MD  [employer] OMEGA-3 KRILL OIL 500 MG CAPS Take by mouth. Take 1 tablet by mouth daily    Historical Provider, MD  lansoprazole (PREVACID) 15 MG capsule Take 1 capsule (15 mg total) by mouth daily. 03/23/16   [name], MD  levothyroxine (SYNTHROID, LEVOTHROID) 112 MCG tablet Take 1 tablet (112 mcg total) by mouth daily. 03/28/16   [name], MD  metoprolol (LOPRESSOR) 50 MG tablet Take 1 tablet (50 mg total) by mouth 2 (two) times daily. 03/23/16   [name], MD  niacin 500 MG tablet Take 1,500 mg by mouth daily.     Historical Provider, MD  nitroGLYCERIN (NITROSTAT) 0.4 MG SL tablet Place 1 tablet (0.4 mg total) under the tongue every 5 (five) minutes as needed for chest pain. 12/14/10   Josue Hector, MD  rosuvastatin (CRESTOR) 20 MG tablet Take 1 tablet (20 mg total) by mouth daily. 03/23/16   [name], MD  tamsulosin (FLOMAX) 0.4 MG CAPS capsule Take 1 capsule (0.4 mg total) by mouth daily. 03/23/16   [name], MD  triamterene-hydrochlorothiazide (MAXZIDE-25) 37.5-25 MG tablet Take 1 tablet by mouth every morning. 03/23/16   [name], MD    Family History Family History  Problem Relation Age of Onset  . Hypertension Mother   . Heart disease Father   . Heart attack Father   . Cancer Brother     Lung  . Diabetes Sister     Social History Social History  Substance Use Topics  . Smoking status: Former Smoker    Types: Cigarettes    Quit date: 06/20/1992  . Smokeless tobacco: Never Used  . Alcohol use  No     Allergies   Imitrex [sumatriptan] and Lipitor [atorvastatin]   Review of Systems Review of Systems  Unable to perform ROS: Acuity of condition     Physical Exam Updated Vital Signs BP (!) 99/41 (BP Location: Left Arm)   Pulse 61   Temp 97.5 F (36.4 C) (Oral)   Resp 16   Ht '5\' 6"'$  (1.676 m)   Wt 71.7 kg   SpO2 97%   BMI 25.50 kg/m  Physical Exam  Constitutional: He appears well-developed and well-nourished.  HENT:  Head: Normocephalic and atraumatic.  Neck: Neck supple.  Pulmonary/Chest: Effort normal.  Abdominal: Soft. There is tenderness.  Musculoskeletal:  Large area of edema, tenderness, ecchymosis over left lower back  Neurological: He is alert.  Alert   Skin:  Skin tone grey.  Nursing note and vitals reviewed.    ED Treatments / Results  Labs (all labs ordered are listed, but only abnormal results are displayed) Labs Reviewed  CDS SEROLOGY  COMPREHENSIVE METABOLIC PANEL  CBC  ETHANOL  URINALYSIS, ROUTINE W REFLEX MICROSCOPIC  PROTIME-INR  I-STAT CHEM 8, ED  I-STAT CG4 LACTIC ACID, ED  SAMPLE TO BLOOD BANK    EKG  EKG Interpretation None       Radiology No results found.  Procedures Procedures (including critical care time)  Medications Ordered in ED Medications - No data to display   Initial Impression / Assessment and Plan / ED Course  I have reviewed the triage vital signs and the nursing notes.  Pertinent labs & imaging results that were available during my care of the patient were reviewed by me and considered in my medical decision making (see chart for details).  Clinical Course as of Aug 23 1634  Mon Aug 22, 2016  1502 Dr Tomi Bamberger made aware of patient, currently with patient.    [EW]  1524 I was called to bedside to evaluate the patient after he was noted that he was hypotensive in the ED. I activated level I trauma considering his hypotension.  In the process of performing a limited FAST exam when Dr. Hulen Skains arrived  at the bedside .  Patient moved to the trauma room. I do not see any signs of free fluid in the left upper quadrant or right upper quadrant views.  Dr Hulen Skains , trauma surgery will continue his care.  [JK]    Clinical Course User Index [EW] Clayton Bibles, PA-C [JK] Dorie Rank, MD    Pt initially triaged to Pod E.  I was alerted to patient's more extensive injuries and hypotension and evaluated the patient.  Orders placed, Dr Tomi Bamberger made aware of patient.  Care then assumed by Dr Tomi Bamberger, then Dr Hulen Skains - see their notes for details.    Final Clinical Impressions(s) / ED Diagnoses   Final diagnoses:  None    New Prescriptions New Prescriptions   No medications on file     Clayton Bibles, PA-C 08/22/16 [place], MD 08/25/16 [phone]

## 2016-08-22 NOTE — ED Notes (Signed)
Abnormal CG-4 reported to Dr. Leonette Monarch

## 2016-08-22 NOTE — Consult Note (Signed)
ORTHOPAEDIC CONSULTATION  REQUESTING PHYSICIAN: Trauma Md, MD  Chief Complaint: pelvic ring injury  HPI: [name] [name] is a 76 y.o. male who presents with APC pelvic ring injury s/p fall from height from ladder PTA.  Landed directly on posterior buttock.  Denies LOC, neck pain, abd pain.  Wife called EMS immediately.  Takes plavix for PVD.  Was hypotensive in the ER. 1 unit of blood and 2 units of FFP given and pelvic binder applied in the ER.  Admitted to trauma.    Past Medical History:  Diagnosis Date  . Atrial fibrillation ([place])    no hx of reported at preop visit of 04/21/11   . CAD (coronary artery disease)   . Carotid artery occlusion    left carotid endarterectomy  . Chronic kidney disease    AAA repair with reimplant of renals   . CHRONIC OBSTRUCTIVE PULMONARY DISEASE    pt denied at visit of 04/21/11   . CORONARY ARTERY DISEASE   . DIVERTICULOSIS OF COLON   . GERD   . H/O hiatal hernia   . Headache(784.0)    Hx: of years of years  . HYPERTENSION   . HYPOTHYROIDISM   . JOINT EFFUSION, KNEE   . KNEE, ARTHRITIS, DEGEN./OSTEO    right knee   . Myocardial infarction    1994   . NEPHROLITHIASIS, HX OF   . OSTEOPOROSIS   . Other dysphagia   . PERIPHERAL VASCULAR DISEASE    AAA - 1994 with reimplant of renals   . Peripheral vascular disease ([place])    subclavian stenosis PTA - 3/08   . Pure hypercholesterolemia   . Renal artery stenosis [facility])    Past Surgical History:  Procedure Laterality Date  . ABDOMINAL AORTIC ANEURYSM REPAIR     with reimplantation of renals   . CARDIAC CATHETERIZATION     2008  . CAROTID ENDARTERECTOMY Left Jan. 30, 2015   CEA  . CHOLECYSTECTOMY  2000   Gall Bladder  . COLONOSCOPY W/ BIOPSIES AND POLYPECTOMY     Hx: of  . CORONARY ARTERY BYPASS GRAFT     1995  . CORONARY STENT PLACEMENT     Hx: of  . ENDARTERECTOMY Left 07/19/2013   Procedure: ENDARTERECTOMY CAROTID;  Surgeon: Mal Misty, MD;  Location: [place];  Service:  Vascular;  Laterality: Left;  . GALLBLADDER SURGERY  2004   . JOINT REPLACEMENT     partial knee replacement on left 2002   . KNEE SURGERY    . OTHER SURGICAL HISTORY     left subclavian stenosis surgery PTA 08/2006   . OTHER SURGICAL HISTORY     carotid surgery on right 2004   . TOTAL KNEE ARTHROPLASTY  04/29/2011   Procedure: TOTAL KNEE ARTHROPLASTY;  Surgeon: Gearlean Alf;  Location: [facility];  Service: Orthopedics;  Laterality: Right;  [name] VASCULAR SURGERY     AAA  . VASECTOMY  82   Social History   Social History  . Marital status: Married    Spouse name: [name]  . Number of children: [name]  . Years of education: [name]   Social History Main Topics  . Smoking status: Former Smoker    Types: Cigarettes    Quit date: 06/20/1992  . Smokeless tobacco: Never Used  . Alcohol use No  . Drug use: No  . Sexual activity: Not Asked   Other Topics Concern  . None   Social History Narrative  . None  Family History  Problem Relation Age of Onset  . Hypertension Mother   . Heart disease Father   . Heart attack Father   . Cancer Brother     Lung  . Diabetes Sister    - negative except otherwise stated in the family history section Allergies  Allergen Reactions  . Imitrex [Sumatriptan] Nausea And Vomiting and Other (See Comments)    "made me like I was having a stoke"  . Lipitor [Atorvastatin] Nausea And Vomiting and Other (See Comments)    "couldn't get out of the bed ( pt had to crawl out of bed ) I hurt so bad"   Prior to Admission medications   Medication Sig Start Date End Date Taking? Authorizing Provider  aspirin EC 81 MG tablet Take 1 tablet (81 mg total) by mouth daily. 01/29/15   Kathyrn Drown, MD  clopidogrel (PLAVIX) 75 MG tablet TAKE ONE (1) TABLET EACH DAY 03/23/16   [name], MD  Coenzyme Q10-Vitamin E 100-300 MG-UNIT WAFR Take by mouth. Take 1 tablet by mouth daily    Historical Provider, MD  [employer] OMEGA-3 KRILL OIL 500 MG CAPS Take by mouth. Take 1 tablet  by mouth daily    Historical Provider, MD  lansoprazole (PREVACID) 15 MG capsule Take 1 capsule (15 mg total) by mouth daily. 03/23/16   [name], MD  levothyroxine (SYNTHROID, LEVOTHROID) 112 MCG tablet Take 1 tablet (112 mcg total) by mouth daily. 03/28/16   [name], MD  metoprolol (LOPRESSOR) 50 MG tablet Take 1 tablet (50 mg total) by mouth 2 (two) times daily. 03/23/16   [name], MD  niacin 500 MG tablet Take 1,500 mg by mouth daily.     Historical Provider, MD  nitroGLYCERIN (NITROSTAT) 0.4 MG SL tablet Place 1 tablet (0.4 mg total) under the tongue every 5 (five) minutes as needed for chest pain. 12/14/10   Josue Hector, MD  rosuvastatin (CRESTOR) 20 MG tablet Take 1 tablet (20 mg total) by mouth daily. 03/23/16   [name], MD  tamsulosin (FLOMAX) 0.4 MG CAPS capsule Take 1 capsule (0.4 mg total) by mouth daily. 03/23/16   [name], MD  triamterene-hydrochlorothiazide (MAXZIDE-25) 37.5-25 MG tablet Take 1 tablet by mouth every morning. 03/23/16   [name], MD   Ct Head Wo Contrast  Result Date: 08/22/2016 CLINICAL DATA:  76 year old male status post fall from ladder. Extensive pelvic fractures. Abnormal chest CT today revealing a spiculated right middle lobe lung mass. Initial encounter. EXAM: CT HEAD WITHOUT CONTRAST CT CERVICAL SPINE WITHOUT CONTRAST TECHNIQUE: Multidetector CT imaging of the head and cervical spine was performed following the standard protocol without intravenous contrast. Multiplanar CT image reconstructions of the cervical spine were also generated. COMPARISON:  CT chest abdomen and pelvis from today reported separately. [facility] Brain MRI 07/13/2004. FINDINGS: CT HEAD FINDINGS Brain: Rounded mixed hyper and hypodense somewhat masslike area in the anterior left frontal lobe estimated at 2 cm. Adjacent increased white matter hypodensity in a configuration suggestive of mild vasogenic edema. See series 8, image 43. No  significant regional mass effect. No second similar lesion identified elsewhere although there is a punctate perhaps dystrophic calcification in the right frontal lobe at the gray-white matter junction (series 4, image 26). No superimposed intracranial hemorrhage or mass effect. Basilar cisterns are patent. Cavum septum pellucidum variant. No ventriculomegaly. Hypodensity in the right caudate nucleus compatible with chronic small vessel disease. No acute cortically based  infarct identified. Vascular: Calcified atherosclerosis at the skull base. No suspicious intracranial vascular hyperdensity. Skull: No skull fracture identified. Bone mineralization appears within normal limits for age. Sinuses/Orbits: Mild to moderate anterior ethmoid and frontoethmoidal recess mucosal thickening. Other paranasal sinuses are clear. The left tympanic cavity and mastoids are clear. There is sclerosis of the right mastoids. There is asymmetric thickening of the right tympanic membrane. Other: Visualized orbit soft tissues are within normal limits. Mild left posterosuperior convexity scalp contusion or hematoma (series 6, image 76) with intact underlying calvarium. No other scout soft tissue injury identified. CT CERVICAL SPINE FINDINGS Alignment: Preserved cervical lordosis. Cervicothoracic junction alignment is within normal limits. There is trace anterolisthesis of C3 on C4 and C4 on C5 with associated chronic facet arthropathy. Bilateral posterior element alignment is within normal limits. Skull base and vertebrae: Visualized skull base is intact. No atlanto-occipital dissociation. No cervical spine fracture identified. There is heterogeneous bone mineralization in the cervical spine, including small lucent lesions in the right C4 (coronal image 31) and left C6 (coronal image 36) levels. Soft tissues and spinal canal: No prevertebral fluid or swelling. No visible canal hematoma. Ligamentous hypertrophy about the odontoid. Calcified  carotid and vertebral artery atherosclerosis. Evidence of previous bilateral carotid endarterectomy. Extensive calcified proximal great vessel atherosclerosis at the visible thoracic inlet. Disc levels: Moderate to severe chronic facet degeneration greater on the right from the C3 to the C6 level. Disc bulging. Multilevel ligament flavum hypertrophy suspected. Up to mild degenerative cervical spinal stenosis at C3-C4. Upper chest: Visible upper thoracic levels appear intact. Chest findings today are reported separately. IMPRESSION: 1. Abnormal anterior left frontal lobe, but the appearance does not resemble typical traumatic injury to the brain and is instead suspicious in this clinical setting for a 2 cm brain mass with mild surrounding vasogenic edema. Brain MRI without and with contrast when possible would characterize further. 2. No other acute intracranial abnormality is evident. Mild chronic small vessel disease in the right basal ganglia. 3.  No acute fracture or listhesis identified in the cervical spine. 4. Heterogeneous bone mineralization in the cervical spine could be degenerative in nature but early osseous metastatic disease is difficult to exclude. The head CT findings were reviewed in person with Dr. Doreen Salvage on 08/22/2016 at 1557 hours. Electronically Signed   By: Genevie Ann M.D.   On: 08/22/2016 16:23   Ct Chest W Contrast  Result Date: 08/22/2016 CLINICAL DATA:  Multiple trauma. The patient fell 12 feet from a ladder. EXAM: CT CHEST, ABDOMEN, AND PELVIS WITH CONTRAST TECHNIQUE: Multidetector CT imaging of the chest, abdomen and pelvis was performed following the standard protocol during bolus administration of intravenous contrast. CONTRAST:  100 cc Omnipaque 300 COMPARISON:  Pelvic radiograph dated 08/22/2016 and chest x-ray dated 08/22/2016 FINDINGS: CT CHEST FINDINGS Cardiovascular: Extensive aortic atherosclerosis. Extensive coronary artery calcifications. Prior CABG. Heart size is normal.  Mediastinum/Nodes: 3.7 x 2.3 cm enlarged subcarinal lymph node. Thyroid gland, trachea, and esophagus demonstrate no significant findings. Lungs/Pleura: There is a spiculated 2.0 x 2.8 cm mass in the right middle lobe with soft tissue stranding to the periphery, consistent with a primary carcinoma the lung. There is a vague 7 mm area of abnormal density in the posterior aspect of the left upper lobe on image 47 of series 3. There are 2 small nodular densities in the right upper lobe on image number 31 of series 3, 1 is 4 mm and the other is 2 mm. Musculoskeletal: No fractures or  other acute abnormalities. CT ABDOMEN PELVIS FINDINGS Hepatobiliary: No focal liver abnormality is seen. Status post cholecystectomy. No biliary dilatation. Pancreas: Unremarkable. No pancreatic ductal dilatation or surrounding inflammatory changes. Spleen: No splenic injury or perisplenic hematoma. Adrenals/Urinary Tract: Adrenal glands are unremarkable. Kidneys are normal, without renal calculi, focal lesion, or hydronephrosis. There is retroperitoneal hemorrhage around the left side of the bladder. Tear is factor Maxwell findings no hemorrhage contrecoup never mind anterior to a diameter question MR plaques are Stomach/Bowel: Stomach is within normal limits. Appendix appears normal. No evidence of bowel wall thickening, distention, or inflammatory changes. Vascular/Lymphatic: There is retroperitoneal hemorrhage in the pelvis particularly adjacent to the left common iliac artery. I suspect the bleeding is from the left iliac vein. There is blood in the retroperitoneum anterior to the prostate gland and bladder. No appreciable free intraperitoneal hemorrhage. Hemorrhagic contusion in the left buttock. Reproductive: Slight enlargement of the prostate gland. Other: There is hemorrhage along the left iliopsoas muscle. Musculoskeletal: There is diastases of the symphysis pubis and of the left sacroiliac joint. There are fractures of the left  transverse processes of L2 through S1. IMPRESSION: 1. Disruption of the symphysis pubis and left sacroiliac joint with retroperitoneal hemorrhage in the left side of the pelvis and subcutaneous hemorrhage in the left buttock. I suspect there is an injury to the left external iliac vein. 2. Spiculated mass in the right middle lobe consistent with carcinoma the lung with metastatic adenopathy in the subcarinal region. 3. Several small pulmonary nodules which are too small to characterize but could represent metastases. 4. Fractures of the left transverse processes of L2 through S1. Electronically Signed   By: Lorriane Shire M.D.   On: 08/22/2016 16:19   Ct Cervical Spine Wo Contrast  Result Date: 08/22/2016 CLINICAL DATA:  76 year old male status post fall from ladder. Extensive pelvic fractures. Abnormal chest CT today revealing a spiculated right middle lobe lung mass. Initial encounter. EXAM: CT HEAD WITHOUT CONTRAST CT CERVICAL SPINE WITHOUT CONTRAST TECHNIQUE: Multidetector CT imaging of the head and cervical spine was performed following the standard protocol without intravenous contrast. Multiplanar CT image reconstructions of the cervical spine were also generated. COMPARISON:  CT chest abdomen and pelvis from today reported separately. [facility] Brain MRI 07/13/2004. FINDINGS: CT HEAD FINDINGS Brain: Rounded mixed hyper and hypodense somewhat masslike area in the anterior left frontal lobe estimated at 2 cm. Adjacent increased white matter hypodensity in a configuration suggestive of mild vasogenic edema. See series 8, image 43. No significant regional mass effect. No second similar lesion identified elsewhere although there is a punctate perhaps dystrophic calcification in the right frontal lobe at the gray-white matter junction (series 4, image 26). No superimposed intracranial hemorrhage or mass effect. Basilar cisterns are patent. Cavum septum pellucidum variant. No ventriculomegaly.  Hypodensity in the right caudate nucleus compatible with chronic small vessel disease. No acute cortically based infarct identified. Vascular: Calcified atherosclerosis at the skull base. No suspicious intracranial vascular hyperdensity. Skull: No skull fracture identified. Bone mineralization appears within normal limits for age. Sinuses/Orbits: Mild to moderate anterior ethmoid and frontoethmoidal recess mucosal thickening. Other paranasal sinuses are clear. The left tympanic cavity and mastoids are clear. There is sclerosis of the right mastoids. There is asymmetric thickening of the right tympanic membrane. Other: Visualized orbit soft tissues are within normal limits. Mild left posterosuperior convexity scalp contusion or hematoma (series 6, image 76) with intact underlying calvarium. No other scout soft tissue injury identified. CT CERVICAL SPINE FINDINGS Alignment:  Preserved cervical lordosis. Cervicothoracic junction alignment is within normal limits. There is trace anterolisthesis of C3 on C4 and C4 on C5 with associated chronic facet arthropathy. Bilateral posterior element alignment is within normal limits. Skull base and vertebrae: Visualized skull base is intact. No atlanto-occipital dissociation. No cervical spine fracture identified. There is heterogeneous bone mineralization in the cervical spine, including small lucent lesions in the right C4 (coronal image 31) and left C6 (coronal image 36) levels. Soft tissues and spinal canal: No prevertebral fluid or swelling. No visible canal hematoma. Ligamentous hypertrophy about the odontoid. Calcified carotid and vertebral artery atherosclerosis. Evidence of previous bilateral carotid endarterectomy. Extensive calcified proximal great vessel atherosclerosis at the visible thoracic inlet. Disc levels: Moderate to severe chronic facet degeneration greater on the right from the C3 to the C6 level. Disc bulging. Multilevel ligament flavum hypertrophy suspected.  Up to mild degenerative cervical spinal stenosis at C3-C4. Upper chest: Visible upper thoracic levels appear intact. Chest findings today are reported separately. IMPRESSION: 1. Abnormal anterior left frontal lobe, but the appearance does not resemble typical traumatic injury to the brain and is instead suspicious in this clinical setting for a 2 cm brain mass with mild surrounding vasogenic edema. Brain MRI without and with contrast when possible would characterize further. 2. No other acute intracranial abnormality is evident. Mild chronic small vessel disease in the right basal ganglia. 3.  No acute fracture or listhesis identified in the cervical spine. 4. Heterogeneous bone mineralization in the cervical spine could be degenerative in nature but early osseous metastatic disease is difficult to exclude. The head CT findings were reviewed in person with Dr. Doreen Salvage on 08/22/2016 at 1557 hours. Electronically Signed   By: Genevie Ann M.D.   On: 08/22/2016 16:23   Ct Abdomen Pelvis W Contrast  Result Date: 08/22/2016 CLINICAL DATA:  Multiple trauma. The patient fell 12 feet from a ladder. EXAM: CT CHEST, ABDOMEN, AND PELVIS WITH CONTRAST TECHNIQUE: Multidetector CT imaging of the chest, abdomen and pelvis was performed following the standard protocol during bolus administration of intravenous contrast. CONTRAST:  100 cc Omnipaque 300 COMPARISON:  Pelvic radiograph dated 08/22/2016 and chest x-ray dated 08/22/2016 FINDINGS: CT CHEST FINDINGS Cardiovascular: Extensive aortic atherosclerosis. Extensive coronary artery calcifications. Prior CABG. Heart size is normal. Mediastinum/Nodes: 3.7 x 2.3 cm enlarged subcarinal lymph node. Thyroid gland, trachea, and esophagus demonstrate no significant findings. Lungs/Pleura: There is a spiculated 2.0 x 2.8 cm mass in the right middle lobe with soft tissue stranding to the periphery, consistent with a primary carcinoma the lung. There is a vague 7 mm area of abnormal density in  the posterior aspect of the left upper lobe on image 47 of series 3. There are 2 small nodular densities in the right upper lobe on image number 31 of series 3, 1 is 4 mm and the other is 2 mm. Musculoskeletal: No fractures or other acute abnormalities. CT ABDOMEN PELVIS FINDINGS Hepatobiliary: No focal liver abnormality is seen. Status post cholecystectomy. No biliary dilatation. Pancreas: Unremarkable. No pancreatic ductal dilatation or surrounding inflammatory changes. Spleen: No splenic injury or perisplenic hematoma. Adrenals/Urinary Tract: Adrenal glands are unremarkable. Kidneys are normal, without renal calculi, focal lesion, or hydronephrosis. There is retroperitoneal hemorrhage around the left side of the bladder. Tear is factor Maxwell findings no hemorrhage contrecoup never mind anterior to a diameter question MR plaques are Stomach/Bowel: Stomach is within normal limits. Appendix appears normal. No evidence of bowel wall thickening, distention, or inflammatory changes. Vascular/Lymphatic: There  is retroperitoneal hemorrhage in the pelvis particularly adjacent to the left common iliac artery. I suspect the bleeding is from the left iliac vein. There is blood in the retroperitoneum anterior to the prostate gland and bladder. No appreciable free intraperitoneal hemorrhage. Hemorrhagic contusion in the left buttock. Reproductive: Slight enlargement of the prostate gland. Other: There is hemorrhage along the left iliopsoas muscle. Musculoskeletal: There is diastases of the symphysis pubis and of the left sacroiliac joint. There are fractures of the left transverse processes of L2 through S1. IMPRESSION: 1. Disruption of the symphysis pubis and left sacroiliac joint with retroperitoneal hemorrhage in the left side of the pelvis and subcutaneous hemorrhage in the left buttock. I suspect there is an injury to the left external iliac vein. 2. Spiculated mass in the right middle lobe consistent with carcinoma the  lung with metastatic adenopathy in the subcarinal region. 3. Several small pulmonary nodules which are too small to characterize but could represent metastases. 4. Fractures of the left transverse processes of L2 through S1. Electronically Signed   By: Lorriane Shire M.D.   On: 08/22/2016 16:19   Dg Pelvis Portable  Result Date: 08/22/2016 CLINICAL DATA:  Fall from roof.  Right flank and low back pain. EXAM: PORTABLE PELVIS 1-2 VIEWS COMPARISON:  07/13/2004 CT angiogram of the abdomen and pelvis. FINDINGS: There is abnormal widening at the pubic symphysis. There is mild 5 mm elevation of the right pubic bone relative to the left pubic bone at the pubic symphysis. No appreciable widening at the sacroiliac joints. Questionable vertical lucencies in the superior lateral sacral ala bilaterally. No evidence of hip dislocation on this single frontal view. No suspicious focal osseous lesion. Iliofemoral atherosclerosis bilaterally. IMPRESSION: 1. Mild diastasis and offset at the pubic symphysis suspicious for symphyseal disruption. 2. Questionable vertical lucencies in the superolateral sacral ala bilaterally, cannot exclude nondisplaced sacral fractures. No sacroiliac joint diastasis. 3. Recommend correlation with bony protocol pelvis CT. Electronically Signed   By: Ilona Sorrel M.D.   On: 08/22/2016 15:36   Dg Chest Port 1 View  Result Date: 08/22/2016 CLINICAL DATA:  Right flank and lower back pain after falling from a ladder 12 feet. EXAM: PORTABLE CHEST 1 VIEW COMPARISON:  Chest x-ray dated 07/17/2013 FINDINGS: The heart size and pulmonary vascularity are normal and the lungs are clear. CABG. Aortic atherosclerosis. No acute bone abnormality. IMPRESSION: No acute abnormalities.  CABG. Aortic atherosclerosis. Electronically Signed   By: Lorriane Shire M.D.   On: 08/22/2016 15:33   - pertinent xrays, CT, MRI studies were reviewed and independently interpreted  Positive ROS: All other systems have been reviewed  and were otherwise negative with the exception of those mentioned in the HPI and as above.  Physical Exam: General: Alert, no acute distress Cardiovascular: No pedal edema Respiratory: No cyanosis, no use of accessory musculature GI: No organomegaly, abdomen is soft and non-tender Skin: No lesions in the area of chief complaint Neurologic: Sensation intact distally Psychiatric: Patient is competent for consent with normal mood and affect Lymphatic: No axillary or cervical lymphadenopathy  MUSCULOSKELETAL:  BLE - NVI - pain with pelvic compression  Assessment: APC pelvic ring injury  Plan: - pelvic binder repositioned - he has been HDS - will contact Dr. Marcelino Scot for his expertise with this matter - appreciate trauma surgery - hold plavix - bedrest  Thank you for the consult and the opportunity to see Mr. Derrich Gaby. Eduard Roux, MD [place] 5:45 PM

## 2016-08-22 NOTE — ED Triage Notes (Addendum)
Arrived via EMS patient climbed a ladder 12 feet attempted to get on a roof. Ladder slipped patient fell with ladder landed on ladder onto brick steps on patient's back.  No LOC alert answering and following commands appropriate. Abrasions bilateral upper arms bandaged prior to arrival, Left flank and middle lower back swelling. Pain at rest 3/10 achy with movement 8-10/10 sharp. Full sensation all extremities pedal and radial pulse +2.

## 2016-08-22 NOTE — ED Notes (Signed)
First unit [facility] emergency release started. Unit # [id number]

## 2016-08-22 NOTE — H&P (Signed)
[facility] Surgery Admission Note  [name] [name] Oct 20, 1940  [id number].    Requesting MD: Tomi Bamberger, MD Chief Complaint/Reason for Consult: fall, hypotension  HPI:  76 y/o white male with PMH HTN, HLD, a.fib, and PVD who presented to [facility] via EMS from [place], [place] after a fall from a ladder. Patient was on a ladder attempting to clean out his stove pipe when the ladder became unstable and he fell. He denies LOC and called for his wife immediately. Upon arrival to ED he was hemodynamically stable and GCS 15. He became hypotensive and trauma team was called. Patient is a former smoker who quit >20 years ago. Denies alcohol use. Patient is on Plavix. He has a history of multiple surgeries for complications relation to his PVD including surgery for bilateral renal artery stenosis (2015), CABG (1995), carotid endarterectomy (2015).   ROS: Review of Systems  Constitutional: Negative.  Negative for chills and fever.  HENT: Negative for ear pain, hearing loss and tinnitus.   Eyes: Negative for blurred vision and pain.  Cardiovascular: Negative for chest pain and palpitations.  Gastrointestinal: Negative for abdominal pain, nausea and vomiting.  Genitourinary: Negative for dysuria, flank pain, frequency and urgency.  Musculoskeletal: Positive for back pain.  Neurological: Negative for dizziness, focal weakness, seizures and headaches.    Family History  Problem Relation Age of Onset  . Hypertension Mother   . Heart disease Father   . Heart attack Father   . Cancer Brother     Lung  . Diabetes Sister     Past Medical History:  Diagnosis Date  . Atrial fibrillation ([place])    no hx of reported at preop visit of 04/21/11   . CAD (coronary artery disease)   . Carotid artery occlusion    left carotid endarterectomy  . Chronic kidney disease    AAA repair with reimplant of renals   . CHRONIC OBSTRUCTIVE PULMONARY DISEASE    pt denied at visit of 04/21/11   . CORONARY ARTERY DISEASE     . DIVERTICULOSIS OF COLON   . GERD   . H/O hiatal hernia   . Headache(784.0)    Hx: of years of years  . HYPERTENSION   . HYPOTHYROIDISM   . JOINT EFFUSION, KNEE   . KNEE, ARTHRITIS, DEGEN./OSTEO    right knee   . Myocardial infarction    1994   . NEPHROLITHIASIS, HX OF   . OSTEOPOROSIS   . Other dysphagia   . PERIPHERAL VASCULAR DISEASE    AAA - 1994 with reimplant of renals   . Peripheral vascular disease ([place])    subclavian stenosis PTA - 3/08   . Pure hypercholesterolemia   . Renal artery stenosis [facility])     Past Surgical History:  Procedure Laterality Date  . ABDOMINAL AORTIC ANEURYSM REPAIR     with reimplantation of renals   . CARDIAC CATHETERIZATION     2008  . CAROTID ENDARTERECTOMY Left Jan. 30, 2015   CEA  . CHOLECYSTECTOMY  2000   Gall Bladder  . COLONOSCOPY W/ BIOPSIES AND POLYPECTOMY     Hx: of  . CORONARY ARTERY BYPASS GRAFT     1995  . CORONARY STENT PLACEMENT     Hx: of  . ENDARTERECTOMY Left 07/19/2013   Procedure: ENDARTERECTOMY CAROTID;  Surgeon: Mal Misty, MD;  Location: [place];  Service: Vascular;  Laterality: Left;  . GALLBLADDER SURGERY  2004   . JOINT REPLACEMENT     partial knee replacement  on left 2002   . KNEE SURGERY    . OTHER SURGICAL HISTORY     left subclavian stenosis surgery PTA 08/2006   . OTHER SURGICAL HISTORY     carotid surgery on right 2004   . TOTAL KNEE ARTHROPLASTY  04/29/2011   Procedure: TOTAL KNEE ARTHROPLASTY;  Surgeon: Gearlean Alf;  Location: [facility];  Service: Orthopedics;  Laterality: Right;  [name] VASCULAR SURGERY     AAA  . VASECTOMY  1973    Social History:  reports that he quit smoking about 24 years ago. His smoking use included Cigarettes. He has never used smokeless tobacco. He reports that he does not drink alcohol or use drugs.  Allergies:  Allergies  Allergen Reactions  . Imitrex [Sumatriptan] Nausea And Vomiting and Other (See Comments)    "made me like I was having a stoke"  . Lipitor  [Atorvastatin] Nausea And Vomiting and Other (See Comments)    "couldn't get out of the bed ( pt had to crawl out of bed ) I hurt so bad"     (Not in a hospital admission)  Blood pressure (!) 80/40, pulse (!) 54, temperature 97.5 F (36.4 C), temperature source Oral, resp. rate 16, height '5\' 6"'$  (1.676 m), weight 71.7 kg (158 lb), SpO2 97 %. Physical Exam: Physical Exam  Constitutional: He is oriented to person, place, and time and well-developed, well-nourished, and in no distress. No distress.  HENT:  Head: Normocephalic and atraumatic.  Right Ear: External ear normal.  Left Ear: External ear normal.  Nose: Nose normal.  Mouth/Throat: Oropharynx is clear and moist. No oropharyngeal exudate.  Eyes: Pupils are equal, round, and reactive to light. Right eye exhibits no discharge. Left eye exhibits no discharge. No scleral icterus.  Neck: Normal range of motion. Neck supple. No tracheal deviation present. No thyromegaly present.  Cardiovascular: Normal rate and intact distal pulses.  Exam reveals no gallop and no friction rub.   No murmur heard. Pulmonary/Chest: Effort normal and breath sounds normal. No stridor. No respiratory distress. He has no wheezes. He has no rales. He exhibits no tenderness.  Abdominal: Soft. Bowel sounds are normal. He exhibits no distension and no mass. There is no tenderness. There is no rebound and no guarding.  Musculoskeletal: Normal range of motion. He exhibits no edema or deformity.  Suprapubic tenderness over pubic symphysis   Non-tender over spinous processes, no step-off.  Neurological: He is alert and oriented to person, place, and time. GCS score is 15.  Skin: Skin is warm and dry. No rash noted. He is not diaphoretic. No pallor.  Large contusion over left posterior hip  contusion over central back, just superior to sacrum.  Psychiatric: Affect normal.    Results for orders placed or performed during the hospital encounter of 08/22/16 (from the  past 48 hour(s))  Type and screen     Status: None (Preliminary result)   Collection Time: 08/22/16  3:03 PM  Result Value Ref Range   ABO/RH(D) PENDING    Antibody Screen PENDING    Sample Expiration 08/25/2016    Unit Number [id number]    Blood Component Type RBC LR PHER1    Unit division 00    Status of Unit ISSUED    Unit tag comment VERBAL ORDERS PER DR KNAPP    Transfusion Status OK TO TRANSFUSE    Crossmatch Result PENDING    Unit Number [id number]    Blood Component Type RED CELLS,LR    Unit  division 00    Status of Unit ISSUED    Unit tag comment VERBAL ORDERS PER DR KNAPP    Transfusion Status OK TO TRANSFUSE    Crossmatch Result PENDING   Prepare fresh frozen plasma     Status: None (Preliminary result)   Collection Time: 08/22/16  3:03 PM  Result Value Ref Range   Unit Number [id number]    Blood Component Type THAWED PLASMA    Unit division 00    Status of Unit ISSUED    Unit tag comment VERBAL ORDERS PER DR KNAPP    Transfusion Status OK TO TRANSFUSE    Unit Number [id number]    Blood Component Type THAWED PLASMA    Unit division 00    Status of Unit ISSUED    Unit tag comment VERBAL ORDERS PER DR KNAPP    Transfusion Status OK TO TRANSFUSE    No results found.   Assessment/Plan Fall from ladder Hypotension - 1L NS, 1 unit PRBC, pelvic binder  Back pain/contusion - no acute spinal fractures Pelvic fracture - Diastasis left SI and pubic symphysis, orthopedic surgery consult Erlinda Hong, MD) Lung mass - suspicious for cancerous mass with brain metastasis  ABL anemia - serial H&H   Dispo: ICU  Jill Alexanders, [facility] Surgery 08/22/2016, 3:23 PM Pager: [phone] Consults: [phone] Mon-Fri 7:00 am-4:30 pm Sat-Sun 7:00 am-11:30 am    FAST procedure negative as below:   LUQ     RUQ     EPIGASTRIUM   BLADDER

## 2016-08-22 NOTE — ED Notes (Signed)
Provider at bedside

## 2016-08-22 NOTE — Progress Notes (Signed)
Patient having some mild headache prefer tylenol instead of hydromorphone IV. Complaining of tightness in the pelvic binder right side. Cms of rt. Leg is good and intact  No numbness aside from discomfort rt. Pelvic binder. Dr. Redmond Pulling was notified with orders made. Tylenol was given with sips of water. With order to keep the binder as it is, made aware also of urine color slightly pink tinged. Pt. Was repositioned and stated it helps the discomfort better. Will continue to monitor circulation of the lower extremeties and pain.

## 2016-08-23 LAB — CBC
HCT: 33.7 % — ABNORMAL LOW (ref 39.0–52.0)
HEMATOCRIT: 33.5 % — AB (ref 39.0–52.0)
HEMOGLOBIN: 11.6 g/dL — AB (ref 13.0–17.0)
Hemoglobin: 11.7 g/dL — ABNORMAL LOW (ref 13.0–17.0)
MCH: 29.8 pg (ref 26.0–34.0)
MCH: 29.8 pg (ref 26.0–34.0)
MCHC: 34.7 g/dL (ref 30.0–36.0)
MCHC: 34.6 g/dL (ref 30.0–36.0)
MCV: 86 fL (ref 78.0–100.0)
MCV: 86.1 fL (ref 78.0–100.0)
Platelets: 100 10*3/uL — ABNORMAL LOW (ref 150–400)
Platelets: 96 10*3/uL — ABNORMAL LOW (ref 150–400)
RBC: 3.92 MIL/uL — ABNORMAL LOW (ref 4.22–5.81)
RBC: 3.89 MIL/uL — AB (ref 4.22–5.81)
RDW: 14.5 % (ref 11.5–15.5)
RDW: 14.4 % (ref 11.5–15.5)
WBC: 6.6 10*3/uL (ref 4.0–10.5)
WBC: 6 10*3/uL (ref 4.0–10.5)

## 2016-08-23 LAB — BPAM FFP
BLOOD PRODUCT EXPIRATION DATE: 201803072359
Blood Product Expiration Date: 201803072359
ISSUE DATE / TIME: 201803051508
ISSUE DATE / TIME: 201803051508
Unit Type and Rh: 6200
Unit Type and Rh: 6200

## 2016-08-23 LAB — COMPREHENSIVE METABOLIC PANEL
ALBUMIN: 3.1 g/dL — AB (ref 3.5–5.0)
ALT: 22 U/L (ref 17–63)
ANION GAP: 7 (ref 5–15)
AST: 30 U/L (ref 15–41)
Alkaline Phosphatase: 59 U/L (ref 38–126)
BILIRUBIN TOTAL: 1.2 mg/dL (ref 0.3–1.2)
BUN: 15 mg/dL (ref 6–20)
CO2: 23 mmol/L (ref 22–32)
Calcium: 8.5 mg/dL — ABNORMAL LOW (ref 8.9–10.3)
Chloride: 109 mmol/L (ref 101–111)
Creatinine, Ser: 0.8 mg/dL (ref 0.61–1.24)
GFR calc Af Amer: >60 mL/min (ref >60)
GFR calc non Af Amer: >60 mL/min (ref >60)
GLUCOSE: 115 mg/dL — AB (ref 65–99)
POTASSIUM: 3.5 mmol/L (ref 3.5–5.1)
SODIUM: 139 mmol/L (ref 135–145)
Total Protein: 5.3 g/dL — ABNORMAL LOW (ref 6.5–8.1)

## 2016-08-23 LAB — PREPARE FRESH FROZEN PLASMA
UNIT DIVISION: 0
Unit division: 0

## 2016-08-23 LAB — LACTIC ACID, PLASMA: Lactic Acid, Venous: 0.9 mmol/L (ref 0.5–1.9)

## 2016-08-23 NOTE — Consult Note (Signed)
Orthopaedic Trauma Service Consultation  Reason for Consult: Pelvic ring disruption, unstable, hypotension Referring Physician: Eduard Roux, MD  [name] [name] is an 76 y.o. male.  HPI: 76 yo on Plavix with fall from ladder approximately 52f with unstable pelvis from left SI widening and ruptured symphysis. After transient hypotension he responded to binder and resuscitation including FFP and blood. Given the location and complexity of the pelvic ring injuried, Dr. XErlinda Hongasserted this was outside his scope of practice and that it would be in the best interest of the patient to have these injuries evaluated and treated by a fellowship trained orthopaedic traumatologist. Consequently, I was consulted to provide further evaluation and management.  We planned for and scheduled surgery today. No OR availability until late this evening or early morning hours of tomorrow. Consequently, we discussed moving his case to Thursday as a first start provided that he experienced no symptoms or signs with release of the binder.  OF note, also a transient right lateral femoral cutaneous nerve palsy that resolved with padding of the binder using an ABD pad.  Past Medical History:  Diagnosis Date  . [place]    no hx of reported at preop visit of 04/21/11   . CAD (coronary artery disease)   . Carotid artery occlusion    left carotid endarterectomy  . Chronic kidney disease    AAA repair with reimplant of renals   . CHRONIC OBSTRUCTIVE PULMONARY DISEASE    pt denied at visit of 04/21/11   . CORONARY ARTERY DISEASE   . DIVERTICULOSIS OF COLON   . GERD   . H/O hiatal hernia   . Headache(784.0)    Hx: of years of years  . HYPERTENSION   . HYPOTHYROIDISM   . JOINT EFFUSION, KNEE   . KNEE, ARTHRITIS, DEGEN./OSTEO    right knee   . Myocardial infarction    1994   . NEPHROLITHIASIS, HX OF   . OSTEOPOROSIS   . Other dysphagia   . PERIPHERAL VASCULAR DISEASE    AAA - 1994 with reimplant of  renals   . [place]ipheral vascular disease ([place]    subclavian stenosis PTA - 3/08   . Pure hypercholesterolemia   . Renal artery stenosis ([facility]     Past Surgical History:  Procedure Laterality Date  . ABDOMINAL AORTIC ANEURYSM REPAIR     with reimplantation of renals   . CARDIAC CATHETERIZATION     2008  . CAROTID ENDARTERECTOMY Left Jan. 30, 2015   CEA  . CHOLECYSTECTOMY  2000   Gall Bladder  . COLONOSCOPY W/ BIOPSIES AND POLYPECTOMY     Hx: of  . CORONARY ARTERY BYPASS GRAFT     1995  . CORONARY STENT PLACEMENT     Hx: of  . ENDARTERECTOMY Left 07/19/2013   Procedure: ENDARTERECTOMY CAROTID;  Surgeon: JMal Misty MD;  [place]  Service: Vascular;  Laterality: Left;  . GALLBLADDER SURGERY  2004   . JOINT REPLACEMENT     partial knee replacement on left 2002   . KNEE SURGERY    . OTHER SURGICAL HISTORY     left subclavian stenosis surgery PTA 08/2006   . OTHER SURGICAL HISTORY     carotid surgery on right 2004   . TOTAL KNEE ARTHROPLASTY  04/29/2011   Procedure: TOTAL KNEE ARTHROPLASTY;  Surgeon: FGearlean Alf  Location: [facility];  Service: Orthopedics;  Laterality: Right;  .Marland KitchenVASCULAR SURGERY     AAA  . VASECTOMY  75    Family History  Problem Relation Age of Onset  . Hypertension Mother   . Heart disease Father   . Heart attack Father   . Cancer Brother     Lung  . Diabetes Sister     Social History:  reports that he quit smoking about 24 years ago. His smoking use included Cigarettes. He has never used smokeless tobacco. He reports that he does not drink alcohol or use drugs.  Allergies:  Allergies  Allergen Reactions  . Imitrex [Sumatriptan] Nausea And Vomiting and Other (See Comments)    "made me like I was having a stoke"  . Lipitor [Atorvastatin] Nausea And Vomiting and Other (See Comments)    "couldn't get out of the bed ( pt had to crawl out of bed ) I hurt so bad"    Medications:  Prior to Admission:  Prescriptions Prior to Admission   Medication Sig Dispense Refill Last Dose  . acetaminophen (TYLENOL) 325 MG tablet Take 325-650 mg by mouth every 6 (six) hours as needed (for pain).   PRN at PRN  . aspirin EC 81 MG tablet Take 1 tablet (81 mg total) by mouth daily. 30 tablet 5 08/21/2016 at 2000  . clopidogrel (PLAVIX) 75 MG tablet TAKE ONE (1) TABLET EACH DAY 30 tablet 5 08/22/2016 at 0800  . Coenzyme Q10 (CO Q-10 PO) Take 1 capsule by mouth daily.   08/22/2016 at am  . lansoprazole (PREVACID) 15 MG capsule Take 1 capsule (15 mg total) by mouth daily. 30 capsule 5 08/22/2016 at am  . levothyroxine (SYNTHROID, LEVOTHROID) 112 MCG tablet Take 1 tablet (112 mcg total) by mouth daily. 30 tablet 5 08/22/2016 at am  . metoprolol (LOPRESSOR) 50 MG tablet Take 1 tablet (50 mg total) by mouth 2 (two) times daily. 60 tablet 5 08/22/2016 at 0800  . niacin (SLO-NIACIN) 500 MG tablet Take 1,500 mg by mouth at bedtime.   08/21/2016 at pm  . nitroGLYCERIN (NITROSTAT) 0.4 MG SL tablet Place 1 tablet (0.4 mg total) under the tongue every 5 (five) minutes as needed for chest pain. 25 tablet 10 PRN at PRN  . rosuvastatin (CRESTOR) 20 MG tablet Take 1 tablet (20 mg total) by mouth daily. 30 tablet 5 08/22/2016 at am  . tamsulosin (FLOMAX) 0.4 MG CAPS capsule Take 1 capsule (0.4 mg total) by mouth daily. 30 capsule 5 08/21/2016 at pm  . triamterene-hydrochlorothiazide (MAXZIDE-25) 37.5-25 MG tablet Take 1 tablet by mouth every morning. 30 tablet 5 08/22/2016 at am    Results for orders placed or performed during the hospital encounter of 08/22/16 (from the past 48 hour(s))  CDS serology     Status: None   Collection Time: 08/22/16  3:01 PM  Result Value Ref Range   CDS serology specimen STAT   Comprehensive metabolic panel     Status: Abnormal   Collection Time: 08/22/16  3:01 PM  Result Value Ref Range   Sodium 138 135 - 145 mmol/L   Potassium 4.0 3.5 - 5.1 mmol/L   Chloride 107 101 - 111 mmol/L   CO2 24 22 - 32 mmol/L   Glucose, Bld 151 (H) 65 - 99 mg/dL    BUN 18 6 - 20 mg/dL   Creatinine, Ser 0.96 0.61 - 1.24 mg/dL   Calcium 8.5 (L) 8.9 - 10.3 mg/dL   Total Protein 5.1 (L) 6.5 - 8.1 g/dL   Albumin 3.2 (L) 3.5 - 5.0 g/dL   AST 31 15 - 41  U/L   ALT 24 17 - 63 U/L   Alkaline Phosphatase 60 38 - 126 U/L   Total Bilirubin 0.6 0.3 - 1.2 mg/dL   GFR calc non Af Amer >60 >60 mL/min   GFR calc Af Amer >60 >60 mL/min    Comment: (NOTE) The eGFR has been calculated using the CKD EPI equation. This calculation has not been validated in all clinical situations. eGFR's persistently <60 mL/min signify possible Chronic Kidney Disease.    Anion gap 7 5 - 15  CBC     Status: Abnormal   Collection Time: 08/22/16  3:01 PM  Result Value Ref Range   WBC 17.9 (H) 4.0 - 10.5 K/uL   RBC 3.98 (L) 4.22 - 5.81 MIL/uL   Hemoglobin 12.2 (L) 13.0 - 17.0 g/dL   HCT 35.0 (L) 39.0 - 52.0 %   MCV 87.9 78.0 - 100.0 fL   MCH 30.7 26.0 - 34.0 pg   MCHC 34.9 30.0 - 36.0 g/dL   RDW 13.7 11.5 - 15.5 %   Platelets 154 150 - 400 K/uL  Protime-INR     Status: None   Collection Time: 08/22/16  3:01 PM  Result Value Ref Range   Prothrombin Time 15.0 11.4 - 15.2 seconds   INR 1.17   Prepare fresh frozen plasma     Status: None   Collection Time: 08/22/16  3:03 PM  Result Value Ref Range   Unit Number [id number]    Blood Component Type THAWED PLASMA    Unit division 00    Status of Unit ISSUED,FINAL    Unit tag comment VERBAL ORDERS PER DR KNAPP    Transfusion Status OK TO TRANSFUSE    Unit Number [id number]    Blood Component Type THAWED PLASMA    Unit division 00    Status of Unit ISSUED,FINAL    Unit tag comment VERBAL ORDERS PER DR KNAPP    Transfusion Status OK TO TRANSFUSE   Ethanol     Status: None   Collection Time: 08/22/16  3:23 PM  Result Value Ref Range   Alcohol, Ethyl (B) <5 <5 mg/dL    Comment:        LOWEST DETECTABLE LIMIT FOR SERUM ALCOHOL IS 5 mg/dL FOR MEDICAL PURPOSES ONLY   Type and screen     Status: None (Preliminary result)    Collection Time: 08/22/16  3:23 PM  Result Value Ref Range   ABO/RH(D) O POS    Antibody Screen NEG    Sample Expiration 08/25/2016    Unit Number [id number]    Blood Component Type RBC LR PHER1    Unit division 00    Status of Unit REL FROM [facility]    Unit tag comment VERBAL ORDERS PER DR KNAPP    Transfusion Status OK TO TRANSFUSE    Crossmatch Result COMPATIBLE    Unit Number [id number]    Blood Component Type RED CELLS,LR    Unit division 00    Status of Unit ISSUED,FINAL    Unit tag comment VERBAL ORDERS PER DR KNAPP    Transfusion Status OK TO TRANSFUSE    Crossmatch Result COMPATIBLE    Unit Number [id number]    Blood Component Type RED CELLS,LR    Unit division 00    Status of Unit ISSUED,FINAL    Transfusion Status OK TO TRANSFUSE    Crossmatch Result Compatible    Unit Number [id number]    Blood Component Type RED CELLS,LR    Unit  division 00    Status of Unit ALLOCATED    Transfusion Status OK TO TRANSFUSE    Crossmatch Result Compatible   I-Stat Chem 8, ED     Status: Abnormal   Collection Time: 08/22/16  3:33 PM  Result Value Ref Range   Sodium 140 135 - 145 mmol/L   Potassium 3.7 3.5 - 5.1 mmol/L   Chloride 103 101 - 111 mmol/L   BUN 20 6 - 20 mg/dL   Creatinine, Ser 0.90 0.61 - 1.24 mg/dL   Glucose, Bld 143 (H) 65 - 99 mg/dL   Calcium, Ion 1.14 (L) 1.15 - 1.40 mmol/L   TCO2 23 0 - 100 mmol/L   Hemoglobin 11.2 (L) 13.0 - 17.0 g/dL   HCT 33.0 (L) 39.0 - 52.0 %  I-Stat CG4 Lactic Acid, ED     Status: Abnormal   Collection Time: 08/22/16  3:33 PM  Result Value Ref Range   Lactic Acid, Venous 2.78 (HH) 0.5 - 1.9 mmol/L   Comment NOTIFIED PHYSICIAN   Prepare RBC     Status: None   Collection Time: 08/22/16  4:21 PM  Result Value Ref Range   Order Confirmation ORDER PROCESSED BY BLOOD BANK   Protime-INR     Status: Abnormal   Collection Time: 08/22/16  4:24 PM  Result Value Ref Range   Prothrombin Time 16.9 (H) 11.4 - 15.2 seconds   INR  1.36   APTT     Status: None   Collection Time: 08/22/16  4:24 PM  Result Value Ref Range   aPTT 28 24 - 36 seconds  CBC with Differential     Status: Abnormal   Collection Time: 08/22/16  4:45 PM  Result Value Ref Range   WBC 13.5 (H) 4.0 - 10.5 K/uL   RBC 3.59 (L) 4.22 - 5.81 MIL/uL   Hemoglobin 10.8 (L) 13.0 - 17.0 g/dL   HCT 31.4 (L) 39.0 - 52.0 %   MCV 87.5 78.0 - 100.0 fL   MCH 30.1 26.0 - 34.0 pg   MCHC 34.4 30.0 - 36.0 g/dL   RDW 13.7 11.5 - 15.5 %   Platelets 124 (L) 150 - 400 K/uL   Neutrophils Relative % 91 %   Neutro Abs 12.3 (H) 1.7 - 7.7 K/uL   Lymphocytes Relative 4 %   Lymphs Abs 0.6 (L) 0.7 - 4.0 K/uL   Monocytes Relative 5 %   Monocytes Absolute 0.6 0.1 - 1.0 K/uL   Eosinophils Relative 0 %   Eosinophils Absolute 0.0 0.0 - 0.7 K/uL   Basophils Relative 0 %   Basophils Absolute 0.0 0.0 - 0.1 K/uL  MRSA PCR Screening     Status: None   Collection Time: 08/22/16  5:11 PM  Result Value Ref Range   MRSA by PCR NEGATIVE NEGATIVE    Comment:        The GeneXpert MRSA Assay (FDA approved for NASAL specimens only), is one component of a comprehensive MRSA colonization surveillance program. It is not intended to diagnose MRSA infection nor to guide or monitor treatment for MRSA infections.   CBC     Status: Abnormal   Collection Time: 08/22/16  6:39 PM  Result Value Ref Range   WBC 10.2 4.0 - 10.5 K/uL   RBC 4.15 (L) 4.22 - 5.81 MIL/uL   Hemoglobin 12.5 (L) 13.0 - 17.0 g/dL   HCT 35.8 (L) 39.0 - 52.0 %   MCV 86.3 78.0 - 100.0 fL   MCH 30.1  26.0 - 34.0 pg   MCHC 34.9 30.0 - 36.0 g/dL   RDW 14.0 11.5 - 15.5 %   Platelets 94 (L) 150 - 400 K/uL    Comment: REPEATED TO VERIFY PLATELET COUNT CONFIRMED BY SMEAR   Urinalysis, Routine w reflex microscopic     Status: Abnormal   Collection Time: 08/22/16  9:32 PM  Result Value Ref Range   Color, Urine RED (A) YELLOW    Comment: BIOCHEMICALS MAY BE AFFECTED BY COLOR   APPearance CLOUDY (A) CLEAR   Specific  Gravity, Urine  1.005 - 1.030    TEST NOT REPORTED DUE TO COLOR INTERFERENCE OF URINE PIGMENT   pH  5.0 - 8.0    TEST NOT REPORTED DUE TO COLOR INTERFERENCE OF URINE PIGMENT   Glucose, UA NEGATIVE NEGATIVE mg/dL   Hgb urine dipstick (A) NEGATIVE    TEST NOT REPORTED DUE TO COLOR INTERFERENCE OF URINE PIGMENT   Bilirubin Urine (A) NEGATIVE    TEST NOT REPORTED DUE TO COLOR INTERFERENCE OF URINE PIGMENT   Ketones, ur (A) NEGATIVE mg/dL    TEST NOT REPORTED DUE TO COLOR INTERFERENCE OF URINE PIGMENT   Protein, ur (A) NEGATIVE mg/dL    TEST NOT REPORTED DUE TO COLOR INTERFERENCE OF URINE PIGMENT   Nitrite (A) NEGATIVE    TEST NOT REPORTED DUE TO COLOR INTERFERENCE OF URINE PIGMENT   Leukocytes, UA (A) NEGATIVE    TEST NOT REPORTED DUE TO COLOR INTERFERENCE OF URINE PIGMENT   RBC / HPF TOO NUMEROUS TO COUNT 0 - 5 RBC/hpf   WBC, UA NONE SEEN 0 - 5 WBC/hpf   Bacteria, UA RARE (A) NONE SEEN   Squamous Epithelial / LPF NONE SEEN NONE SEEN  CBC     Status: Abnormal   Collection Time: 08/23/16  3:33 AM  Result Value Ref Range   WBC 6.6 4.0 - 10.5 K/uL   RBC 3.92 (L) 4.22 - 5.81 MIL/uL   Hemoglobin 11.7 (L) 13.0 - 17.0 g/dL   HCT 33.7 (L) 39.0 - 52.0 %   MCV 86.0 78.0 - 100.0 fL   MCH 29.8 26.0 - 34.0 pg   MCHC 34.7 30.0 - 36.0 g/dL   RDW 14.5 11.5 - 15.5 %   Platelets 100 (L) 150 - 400 K/uL    Comment: CONSISTENT WITH PREVIOUS RESULT  Comprehensive metabolic panel     Status: Abnormal   Collection Time: 08/23/16  3:33 AM  Result Value Ref Range   Sodium 139 135 - 145 mmol/L   Potassium 3.5 3.5 - 5.1 mmol/L   Chloride 109 101 - 111 mmol/L   CO2 23 22 - 32 mmol/L   Glucose, Bld 115 (H) 65 - 99 mg/dL   BUN 15 6 - 20 mg/dL   Creatinine, Ser 0.80 0.61 - 1.24 mg/dL   Calcium 8.5 (L) 8.9 - 10.3 mg/dL   Total Protein 5.3 (L) 6.5 - 8.1 g/dL   Albumin 3.1 (L) 3.5 - 5.0 g/dL   AST 30 15 - 41 U/L   ALT 22 17 - 63 U/L   Alkaline Phosphatase 59 38 - 126 U/L   Total Bilirubin 1.2 0.3 - 1.2  mg/dL   GFR calc non Af Amer >60 >60 mL/min   GFR calc Af Amer >60 >60 mL/min    Comment: (NOTE) The eGFR has been calculated using the CKD EPI equation. This calculation has not been validated in all clinical situations. eGFR's persistently <60 mL/min signify possible Chronic Kidney Disease.  Anion gap 7 5 - 15  CBC     Status: Abnormal   Collection Time: 08/23/16  3:06 PM  Result Value Ref Range   WBC 6.0 4.0 - 10.5 K/uL   RBC 3.89 (L) 4.22 - 5.81 MIL/uL   Hemoglobin 11.6 (L) 13.0 - 17.0 g/dL   HCT 33.5 (L) 39.0 - 52.0 %   MCV 86.1 78.0 - 100.0 fL   MCH 29.8 26.0 - 34.0 pg   MCHC 34.6 30.0 - 36.0 g/dL   RDW 14.4 11.5 - 15.5 %   Platelets 96 (L) 150 - 400 K/uL    Comment: CONSISTENT WITH PREVIOUS RESULT    Ct Head Wo Contrast  Result Date: 08/22/2016 CLINICAL DATA:  76 year old male status post fall from ladder. Extensive pelvic fractures. Abnormal chest CT today revealing a spiculated right middle lobe lung mass. Initial encounter. EXAM: CT HEAD WITHOUT CONTRAST CT CERVICAL SPINE WITHOUT CONTRAST TECHNIQUE: Multidetector CT imaging of the head and cervical spine was performed following the standard protocol without intravenous contrast. Multiplanar CT image reconstructions of the cervical spine were also generated. COMPARISON:  CT chest abdomen and pelvis from today reported separately. [facility] Brain MRI 07/13/2004. FINDINGS: CT HEAD FINDINGS Brain: Rounded mixed hyper and hypodense somewhat masslike area in the anterior left frontal lobe estimated at 2 cm. Adjacent increased white matter hypodensity in a configuration suggestive of mild vasogenic edema. See series 8, image 43. No significant regional mass effect. No second similar lesion identified elsewhere although there is a punctate perhaps dystrophic calcification in the right frontal lobe at the gray-white matter junction (series 4, image 26). No superimposed intracranial hemorrhage or mass effect. Basilar cisterns are  patent. Cavum septum pellucidum variant. No ventriculomegaly. Hypodensity in the right caudate nucleus compatible with chronic small vessel disease. No acute cortically based infarct identified. Vascular: Calcified atherosclerosis at the skull base. No suspicious intracranial vascular hyperdensity. Skull: No skull fracture identified. Bone mineralization appears within normal limits for age. Sinuses/Orbits: Mild to moderate anterior ethmoid and frontoethmoidal recess mucosal thickening. Other paranasal sinuses are clear. The left tympanic cavity and mastoids are clear. There is sclerosis of the right mastoids. There is asymmetric thickening of the right tympanic membrane. Other: Visualized orbit soft tissues are within normal limits. Mild left posterosuperior convexity scalp contusion or hematoma (series 6, image 76) with intact underlying calvarium. No other scout soft tissue injury identified. CT CERVICAL SPINE FINDINGS Alignment: Preserved cervical lordosis. Cervicothoracic junction alignment is within normal limits. There is trace anterolisthesis of C3 on C4 and C4 on C5 with associated chronic facet arthropathy. Bilateral posterior element alignment is within normal limits. Skull base and vertebrae: Visualized skull base is intact. No atlanto-occipital dissociation. No cervical spine fracture identified. There is heterogeneous bone mineralization in the cervical spine, including small lucent lesions in the right C4 (coronal image 31) and left C6 (coronal image 36) levels. Soft tissues and spinal canal: No prevertebral fluid or swelling. No visible canal hematoma. Ligamentous hypertrophy about the odontoid. Calcified carotid and vertebral artery atherosclerosis. Evidence of previous bilateral carotid endarterectomy. Extensive calcified proximal great vessel atherosclerosis at the visible thoracic inlet. Disc levels: Moderate to severe chronic facet degeneration greater on the right from the C3 to the C6 level.  Disc bulging. Multilevel ligament flavum hypertrophy suspected. Up to mild degenerative cervical spinal stenosis at C3-C4. Upper chest: Visible upper thoracic levels appear intact. Chest findings today are reported separately. IMPRESSION: 1. Abnormal anterior left frontal lobe, but the appearance does not  resemble typical traumatic injury to the brain and is instead suspicious in this clinical setting for a 2 cm brain mass with mild surrounding vasogenic edema. Brain MRI without and with contrast when possible would characterize further. 2. No other acute intracranial abnormality is evident. Mild chronic small vessel disease in the right basal ganglia. 3.  No acute fracture or listhesis identified in the cervical spine. 4. Heterogeneous bone mineralization in the cervical spine could be degenerative in nature but early osseous metastatic disease is difficult to exclude. The head CT findings were reviewed in person with Dr. Doreen Salvage on 08/22/2016 at 1557 hours. Electronically Signed   By: Genevie Ann M.D.   On: 08/22/2016 16:23   Ct Chest W Contrast  Result Date: 08/22/2016 CLINICAL DATA:  Multiple trauma. The patient fell 12 feet from a ladder. EXAM: CT CHEST, ABDOMEN, AND PELVIS WITH CONTRAST TECHNIQUE: Multidetector CT imaging of the chest, abdomen and pelvis was performed following the standard protocol during bolus administration of intravenous contrast. CONTRAST:  100 cc Omnipaque 300 COMPARISON:  Pelvic radiograph dated 08/22/2016 and chest x-ray dated 08/22/2016 FINDINGS: CT CHEST FINDINGS Cardiovascular: Extensive aortic atherosclerosis. Extensive coronary artery calcifications. Prior CABG. Heart size is normal. Mediastinum/Nodes: 3.7 x 2.3 cm enlarged subcarinal lymph node. Thyroid gland, trachea, and esophagus demonstrate no significant findings. Lungs/Pleura: There is a spiculated 2.0 x 2.8 cm mass in the right middle lobe with soft tissue stranding to the periphery, consistent with a primary carcinoma the  lung. There is a vague 7 mm area of abnormal density in the posterior aspect of the left upper lobe on image 47 of series 3. There are 2 small nodular densities in the right upper lobe on image number 31 of series 3, 1 is 4 mm and the other is 2 mm. Musculoskeletal: No fractures or other acute abnormalities. CT ABDOMEN PELVIS FINDINGS Hepatobiliary: No focal liver abnormality is seen. Status post cholecystectomy. No biliary dilatation. Pancreas: Unremarkable. No pancreatic ductal dilatation or surrounding inflammatory changes. Spleen: No splenic injury or perisplenic hematoma. Adrenals/Urinary Tract: Adrenal glands are unremarkable. Kidneys are normal, without renal calculi, focal lesion, or hydronephrosis. There is retroperitoneal hemorrhage around the left side of the bladder. Tear is factor Maxwell findings no hemorrhage contrecoup never mind anterior to a diameter question MR plaques are Stomach/Bowel: Stomach is within normal limits. Appendix appears normal. No evidence of bowel wall thickening, distention, or inflammatory changes. Vascular/Lymphatic: There is retroperitoneal hemorrhage in the pelvis particularly adjacent to the left common iliac artery. I suspect the bleeding is from the left iliac vein. There is blood in the retroperitoneum anterior to the prostate gland and bladder. No appreciable free intraperitoneal hemorrhage. Hemorrhagic contusion in the left buttock. Reproductive: Slight enlargement of the prostate gland. Other: There is hemorrhage along the left iliopsoas muscle. Musculoskeletal: There is diastases of the symphysis pubis and of the left sacroiliac joint. There are fractures of the left transverse processes of L2 through S1. IMPRESSION: 1. Disruption of the symphysis pubis and left sacroiliac joint with retroperitoneal hemorrhage in the left side of the pelvis and subcutaneous hemorrhage in the left buttock. I suspect there is an injury to the left external iliac vein. 2. Spiculated  mass in the right middle lobe consistent with carcinoma the lung with metastatic adenopathy in the subcarinal region. 3. Several small pulmonary nodules which are too small to characterize but could represent metastases. 4. Fractures of the left transverse processes of L2 through S1. Electronically Signed   By: Jeneen Rinks  Maxwell M.D.   On: 08/22/2016 16:19   Ct Cervical Spine Wo Contrast  Result Date: 08/22/2016 CLINICAL DATA:  76 year old male status post fall from ladder. Extensive pelvic fractures. Abnormal chest CT today revealing a spiculated right middle lobe lung mass. Initial encounter. EXAM: CT HEAD WITHOUT CONTRAST CT CERVICAL SPINE WITHOUT CONTRAST TECHNIQUE: Multidetector CT imaging of the head and cervical spine was performed following the standard protocol without intravenous contrast. Multiplanar CT image reconstructions of the cervical spine were also generated. COMPARISON:  CT chest abdomen and pelvis from today reported separately. [facility] Brain MRI 07/13/2004. FINDINGS: CT HEAD FINDINGS Brain: Rounded mixed hyper and hypodense somewhat masslike area in the anterior left frontal lobe estimated at 2 cm. Adjacent increased white matter hypodensity in a configuration suggestive of mild vasogenic edema. See series 8, image 43. No significant regional mass effect. No second similar lesion identified elsewhere although there is a punctate perhaps dystrophic calcification in the right frontal lobe at the gray-white matter junction (series 4, image 26). No superimposed intracranial hemorrhage or mass effect. Basilar cisterns are patent. Cavum septum pellucidum variant. No ventriculomegaly. Hypodensity in the right caudate nucleus compatible with chronic small vessel disease. No acute cortically based infarct identified. Vascular: Calcified atherosclerosis at the skull base. No suspicious intracranial vascular hyperdensity. Skull: No skull fracture identified. Bone mineralization appears within  normal limits for age. Sinuses/Orbits: Mild to moderate anterior ethmoid and frontoethmoidal recess mucosal thickening. Other paranasal sinuses are clear. The left tympanic cavity and mastoids are clear. There is sclerosis of the right mastoids. There is asymmetric thickening of the right tympanic membrane. Other: Visualized orbit soft tissues are within normal limits. Mild left posterosuperior convexity scalp contusion or hematoma (series 6, image 76) with intact underlying calvarium. No other scout soft tissue injury identified. CT CERVICAL SPINE FINDINGS Alignment: Preserved cervical lordosis. Cervicothoracic junction alignment is within normal limits. There is trace anterolisthesis of C3 on C4 and C4 on C5 with associated chronic facet arthropathy. Bilateral posterior element alignment is within normal limits. Skull base and vertebrae: Visualized skull base is intact. No atlanto-occipital dissociation. No cervical spine fracture identified. There is heterogeneous bone mineralization in the cervical spine, including small lucent lesions in the right C4 (coronal image 31) and left C6 (coronal image 36) levels. Soft tissues and spinal canal: No prevertebral fluid or swelling. No visible canal hematoma. Ligamentous hypertrophy about the odontoid. Calcified carotid and vertebral artery atherosclerosis. Evidence of previous bilateral carotid endarterectomy. Extensive calcified proximal great vessel atherosclerosis at the visible thoracic inlet. Disc levels: Moderate to severe chronic facet degeneration greater on the right from the C3 to the C6 level. Disc bulging. Multilevel ligament flavum hypertrophy suspected. Up to mild degenerative cervical spinal stenosis at C3-C4. Upper chest: Visible upper thoracic levels appear intact. Chest findings today are reported separately. IMPRESSION: 1. Abnormal anterior left frontal lobe, but the appearance does not resemble typical traumatic injury to the brain and is instead  suspicious in this clinical setting for a 2 cm brain mass with mild surrounding vasogenic edema. Brain MRI without and with contrast when possible would characterize further. 2. No other acute intracranial abnormality is evident. Mild chronic small vessel disease in the right basal ganglia. 3.  No acute fracture or listhesis identified in the cervical spine. 4. Heterogeneous bone mineralization in the cervical spine could be degenerative in nature but early osseous metastatic disease is difficult to exclude. The head CT findings were reviewed in person with Dr. Doreen Salvage on 08/22/2016 at  1557 hours. Electronically Signed   By: Genevie Ann M.D.   On: 08/22/2016 16:23   Ct Abdomen Pelvis W Contrast  Result Date: 08/22/2016 CLINICAL DATA:  Multiple trauma. The patient fell 12 feet from a ladder. EXAM: CT CHEST, ABDOMEN, AND PELVIS WITH CONTRAST TECHNIQUE: Multidetector CT imaging of the chest, abdomen and pelvis was performed following the standard protocol during bolus administration of intravenous contrast. CONTRAST:  100 cc Omnipaque 300 COMPARISON:  Pelvic radiograph dated 08/22/2016 and chest x-ray dated 08/22/2016 FINDINGS: CT CHEST FINDINGS Cardiovascular: Extensive aortic atherosclerosis. Extensive coronary artery calcifications. Prior CABG. Heart size is normal. Mediastinum/Nodes: 3.7 x 2.3 cm enlarged subcarinal lymph node. Thyroid gland, trachea, and esophagus demonstrate no significant findings. Lungs/Pleura: There is a spiculated 2.0 x 2.8 cm mass in the right middle lobe with soft tissue stranding to the periphery, consistent with a primary carcinoma the lung. There is a vague 7 mm area of abnormal density in the posterior aspect of the left upper lobe on image 47 of series 3. There are 2 small nodular densities in the right upper lobe on image number 31 of series 3, 1 is 4 mm and the other is 2 mm. Musculoskeletal: No fractures or other acute abnormalities. CT ABDOMEN PELVIS FINDINGS Hepatobiliary: No focal  liver abnormality is seen. Status post cholecystectomy. No biliary dilatation. Pancreas: Unremarkable. No pancreatic ductal dilatation or surrounding inflammatory changes. Spleen: No splenic injury or perisplenic hematoma. Adrenals/Urinary Tract: Adrenal glands are unremarkable. Kidneys are normal, without renal calculi, focal lesion, or hydronephrosis. There is retroperitoneal hemorrhage around the left side of the bladder. Tear is factor Maxwell findings no hemorrhage contrecoup never mind anterior to a diameter question MR plaques are Stomach/Bowel: Stomach is within normal limits. Appendix appears normal. No evidence of bowel wall thickening, distention, or inflammatory changes. Vascular/Lymphatic: There is retroperitoneal hemorrhage in the pelvis particularly adjacent to the left common iliac artery. I suspect the bleeding is from the left iliac vein. There is blood in the retroperitoneum anterior to the prostate gland and bladder. No appreciable free intraperitoneal hemorrhage. Hemorrhagic contusion in the left buttock. Reproductive: Slight enlargement of the prostate gland. Other: There is hemorrhage along the left iliopsoas muscle. Musculoskeletal: There is diastases of the symphysis pubis and of the left sacroiliac joint. There are fractures of the left transverse processes of L2 through S1. IMPRESSION: 1. Disruption of the symphysis pubis and left sacroiliac joint with retroperitoneal hemorrhage in the left side of the pelvis and subcutaneous hemorrhage in the left buttock. I suspect there is an injury to the left external iliac vein. 2. Spiculated mass in the right middle lobe consistent with carcinoma the lung with metastatic adenopathy in the subcarinal region. 3. Several small pulmonary nodules which are too small to characterize but could represent metastases. 4. Fractures of the left transverse processes of L2 through S1. Electronically Signed   By: Lorriane Shire M.D.   On: 08/22/2016 16:19   Dg  Pelvis Portable  Result Date: 08/22/2016 CLINICAL DATA:  Fall from roof.  Right flank and low back pain. EXAM: PORTABLE PELVIS 1-2 VIEWS COMPARISON:  07/13/2004 CT angiogram of the abdomen and pelvis. FINDINGS: There is abnormal widening at the pubic symphysis. There is mild 5 mm elevation of the right pubic bone relative to the left pubic bone at the pubic symphysis. No appreciable widening at the sacroiliac joints. Questionable vertical lucencies in the superior lateral sacral ala bilaterally. No evidence of hip dislocation on this single frontal view. No suspicious  focal osseous lesion. Iliofemoral atherosclerosis bilaterally. IMPRESSION: 1. Mild diastasis and offset at the pubic symphysis suspicious for symphyseal disruption. 2. Questionable vertical lucencies in the superolateral sacral ala bilaterally, cannot exclude nondisplaced sacral fractures. No sacroiliac joint diastasis. 3. Recommend correlation with bony protocol pelvis CT. Electronically Signed   By: Ilona Sorrel M.D.   On: 08/22/2016 15:36   Dg Chest Port 1 View  Result Date: 08/22/2016 CLINICAL DATA:  Right flank and lower back pain after falling from a ladder 12 feet. EXAM: PORTABLE CHEST 1 VIEW COMPARISON:  Chest x-ray dated 07/17/2013 FINDINGS: The heart size and pulmonary vascularity are normal and the lungs are clear. CABG. Aortic atherosclerosis. No acute bone abnormality. IMPRESSION: No acute abnormalities.  CABG. Aortic atherosclerosis. Electronically Signed   By: Lorriane Shire M.D.   On: 08/22/2016 15:33    ROS No recent fever, bleeding abnormalities, urologic dysfunction, GI problems, or weight gain. Blood pressure (!) 169/85, pulse 100, temperature 97.6 F (36.4 C), temperature source Oral, resp. rate (!) 22, height _0  (1.676 m), weight 71.7 kg (158 lb), SpO2 96 %. Physical Exam NCAT RRR No wheezing S/NT/ no bruising of abdomen Pelvis--no traumatic wounds or rash, no ecchymosis, tenderness mild anteriorly & moderate  left posterior LLE No traumatic wounds, ecchymosis, or rash  Nontender  No knee effusion  Sens DPN, SPN, TN intact  Motor EHL, ext, flex, evers 5/5  DP palp, No significant edema RLE No traumatic wounds, ecchymosis, or rash  Nontender  No knee effusion  Sens DPN, SPN, TN intact  Motor EHL, ext, flex, evers 5/5  DP palp, No significant edema LUEx shoulder, elbow, wrist, digits- no skin wounds, nontender, no instability, no blocks to motion  Sens  Ax/R/M/U intact  Mot   Ax/ R/ PIN/ M/ AIN/ U intact  Rad 2+ RUEx shoulder, elbow, wrist, digits- no skin wounds, nontender, no instability, no blocks to motion  Sens  Ax/R/M/U intact  Mot   Ax/ R/ PIN/ M/ AIN/ U intact  Rad 2+  Assessment/Plan: APC2 pelvic ring instability with associated hypotension that responded well to resuscitation and binder; now tolerating loosened binder well and without increase in pain or decrease in pressure  I discussed with the patient and his family the risks and benefits of surgery, including the possibility of infection, nerve injury, vessel injury, wound breakdown, arthritis, symptomatic hardware, DVT/ PE, loss of motion, and need for further surgery among others.  We also specifically discussed foot drop.  They acknowledged these risks and wished to proceed on Thursday when my PA would be available as a first assist and orthopaedic nursing personnel would be available in addition to supporting services in event of complications. Given the circumstances, I also believe this to be the most advisable course of action for the patient.  Altamese Shiner, MD [facility] [phone] (p)  08/23/2016  4:21 PM

## 2016-08-23 NOTE — Progress Notes (Signed)
Full consult to follow.  76 yo on Plavix with fall from ladder approximately 1f with unstable pelvis from left SI widening and ruptured symphysis. After transient hypotension he responded to binder and resuscitation including FFP and blood. Given the location and complexity of the pelvic ring injuried, Dr. XErlinda Hongasserted this was outside his scope of practice and that it would be in the best interest of the patient to have these injuries evaluated and treated by a fellowship trained orthopaedic traumatologist. Consequently, I was consulted to provide further evaluation and management.  We plan for ORIF of the symphysis and left SI screw fixation later today.  Patient is NPO.  MAltamese Cowen MD [facility] [phone](p)

## 2016-08-23 NOTE — Progress Notes (Signed)
  Subjective: Was able to pass urine - pink tinged. Good pain control.  Objective: Vital signs in last 24 hours: Temp:  [97.5 F (36.4 C)-98.1 F (36.7 C)] 97.8 F (36.6 C) (03/06 0400) Pulse Rate:  [48-94] 71 (03/06 0700) Resp:  [15-30] 17 (03/06 0700) BP: (69-179)/(36-106) 161/72 (03/06 0700) SpO2:  [93 %-100 %] 95 % (03/06 0700) Weight:  [71.7 kg (158 lb)] 71.7 kg (158 lb) (03/05 1448) Last BM Date: 08/22/16  Intake/Output from previous day: 03/05 0701 - 03/06 0700 In: 4704 [I.V.:4704] Out: 2174 [Urine:1245] Intake/Output this shift: No intake/output data recorded.  General appearance: alert and cooperative Resp: clear to auscultation bilaterally Cardio: regular rate and rhythm GI: soft, NT, ND, +BS, pelvic binder in place Extremities: binder as above, PAS Neurologic: Mental status: Alert, oriented, thought content appropriate  Lab Results: CBC   Recent Labs  08/22/16 1839 08/23/16 0333  WBC 10.2 6.6  HGB 12.5* 11.7*  HCT 35.8* 33.7*  PLT 94* 100*   BMET  Recent Labs  08/22/16 1501 08/22/16 1533 08/23/16 0333  NA 138 140 139  K 4.0 3.7 3.5  CL 107 103 109  CO2 24  --  23  GLUCOSE 151* 143* 115*  BUN '18 20 15  '$ CREATININE 0.96 0.90 0.80  CALCIUM 8.5*  --  8.5*   PT/INR  Recent Labs  08/22/16 1501 08/22/16 1624  LABPROT 15.0 16.9*  INR 1.17 1.36   Assessment/Plan: Fall from ladder Open book pelvic FX with L SI disruption - Dr. Erlinda Hong consulted and Dr. Marcelino Scot will evaluate. Possible surgery today. ABL anemia - 2u PRBC and 2u FFP yesterday, Hb 11.7, F/U FEN - NPO for possible OR RML mass/brain mass - likely lung CA with met, will need further evgal as outpatient. I discussed this with him and his family. VTE - PAS Dispo - ICU   LOS: 1 day    Georganna Skeans, MD, MPH, FACS Trauma: [phone] General Surgery: [phone]  3/6/2018Patient ID: [name] [name], male   DOB: [birth year], 76 y.o.   MRN: [id number]

## 2016-08-23 NOTE — Care Management Note (Signed)
Case Management Note  Patient Details  Name: [name] [name] MRN: [id number] Date of Birth: [birth year]  Subjective/Objective:   Pt admitted on 08/22/16 s/p fall from ladder with pelvic fractures and back pain/contusion.  PTA, pt independent and from home with spouse.                    Action/Plan: Pt to OR today for ORIF of the symphysis and Lt SI screw fixation.  Will follow for discharge planning as pt progresses.    Expected Discharge Date:                  Expected Discharge Plan:  [place]  In-House Referral:     Discharge planning Services  CM Consult  Post Acute Care Choice:    Choice offered to:     DME Arranged:    DME Agency:     HH Arranged:    [place] Agency:     Status of Service:  In process, will continue to follow  If discussed at Long Length of Stay Meetings, dates discussed:    Additional Comments:  Reinaldo Raddle, RN, BSN  Trauma/Neuro ICU Case Manager [phone]

## 2016-08-24 ENCOUNTER — Encounter (HOSPITAL_COMMUNITY): Payer: Self-pay | Admitting: Orthopedic Surgery

## 2016-08-24 DIAGNOSIS — M259 Joint disorder, unspecified: Secondary | ICD-10-CM

## 2016-08-24 HISTORY — DX: Joint disorder, unspecified: M25.9

## 2016-08-24 LAB — BASIC METABOLIC PANEL
Anion gap: 8 (ref 5–15)
BUN: 16 mg/dL (ref 6–20)
CHLORIDE: 108 mmol/L (ref 101–111)
CO2: 22 mmol/L (ref 22–32)
CREATININE: 0.73 mg/dL (ref 0.61–1.24)
Calcium: 8.6 mg/dL — ABNORMAL LOW (ref 8.9–10.3)
Glucose, Bld: 107 mg/dL — ABNORMAL HIGH (ref 65–99)
POTASSIUM: 3.4 mmol/L — AB (ref 3.5–5.1)
SODIUM: 138 mmol/L (ref 135–145)

## 2016-08-24 LAB — CBC
HCT: 33.3 % — ABNORMAL LOW (ref 39.0–52.0)
HEMOGLOBIN: 11.6 g/dL — AB (ref 13.0–17.0)
MCH: 29.9 pg (ref 26.0–34.0)
MCHC: 34.8 g/dL (ref 30.0–36.0)
MCV: 85.8 fL (ref 78.0–100.0)
PLATELETS: 97 10*3/uL — AB (ref 150–400)
RBC: 3.88 MIL/uL — AB (ref 4.22–5.81)
RDW: 14.5 % (ref 11.5–15.5)
WBC: 7.1 10*3/uL (ref 4.0–10.5)

## 2016-08-24 LAB — URINALYSIS, ROUTINE W REFLEX MICROSCOPIC

## 2016-08-24 MED ORDER — CEFAZOLIN SODIUM-DEXTROSE 2-4 GM/100ML-% IV SOLN
2.0000 g | INTRAVENOUS | Status: AC
  Administered 2016-08-25: 2 g via INTRAVENOUS
  Filled 2016-08-24 (×2): qty 100

## 2016-08-24 NOTE — Progress Notes (Signed)
Stopped by and visited with pt and wife in rm. They were appreciative of their pastor's visit and the other chaplains they'd seen -- someone in ED and part-time chaplain Fraser Din, whom they see at their church. Pt was alert and happy to have prayer. Provided spiritual/emotional support and prayer. Chaplain available for f/u.   08/24/16 1200  Clinical Encounter Type  Visited With Patient and family together  Visit Type Initial;Psychological support;Spiritual support;Social support;Critical Care  Referral From Chaplain  Spiritual Encounters  Spiritual Needs Prayer;Emotional  Stress Factors  Patient Stress Factors Health changes  Family Stress Factors Family relationships;Health changes   Gerrit Heck, Chaplain

## 2016-08-24 NOTE — Progress Notes (Addendum)
Trauma Service Note  Subjective: Patient is awake and alert.  Does not appear to be in any distress.  Objective: Vital signs in last 24 hours: Temp:  [97.6 F (36.4 C)-97.9 F (36.6 C)] 97.9 F (36.6 C) (03/07 0400) Pulse Rate:  [59-100] 79 (03/07 0700) Resp:  [16-33] 16 (03/07 0700) BP: (121-171)/(43-111) 164/67 (03/07 0700) SpO2:  [92 %-96 %] 94 % (03/07 0700) Last BM Date: 08/22/16  Intake/Output from previous day: 03/06 0701 - 03/07 0700 In: 2400 [I.V.:2400] Out: 975 [Urine:975] Intake/Output this shift: No intake/output data recorded.  General: No distress.  Coralee Pesa was still in place and tightened.  We released it this morning.  Lungs: Clear to auscultation  Abd: Soft, good bowel sounds.  Extremities: No changes  Neuro: Intact  Renal.:  Urine output is good.  Renal function is good .  Urine is very dark and cloudy.  Will send a repeat UA.  Lab Results: CBC   Recent Labs  08/23/16 1506 08/24/16 0451  WBC 6.0 7.1  HGB 11.6* 11.6*  HCT 33.5* 33.3*  PLT 96* 97*   BMET  Recent Labs  08/23/16 0333 08/24/16 0451  NA 139 138  K 3.5 3.4*  CL 109 108  CO2 23 22  GLUCOSE 115* 107*  BUN 15 16  CREATININE 0.80 0.73  CALCIUM 8.5* 8.6*   PT/INR  Recent Labs  08/22/16 1501 08/22/16 1624  LABPROT 15.0 16.9*  INR 1.17 1.36   ABG No results for input(s): PHART, HCO3 in the last 72 hours.  Invalid input(s): PCO2, PO2  Studies/Results: Ct Head Wo Contrast  Result Date: 08/22/2016 CLINICAL DATA:  76 year old male status post fall from ladder. Extensive pelvic fractures. Abnormal chest CT today revealing a spiculated right middle lobe lung mass. Initial encounter. EXAM: CT HEAD WITHOUT CONTRAST CT CERVICAL SPINE WITHOUT CONTRAST TECHNIQUE: Multidetector CT imaging of the head and cervical spine was performed following the standard protocol without intravenous contrast. Multiplanar CT image reconstructions of the cervical spine were also generated.  COMPARISON:  CT chest abdomen and pelvis from today reported separately. [facility] Brain MRI 07/13/2004. FINDINGS: CT HEAD FINDINGS Brain: Rounded mixed hyper and hypodense somewhat masslike area in the anterior left frontal lobe estimated at 2 cm. Adjacent increased white matter hypodensity in a configuration suggestive of mild vasogenic edema. See series 8, image 43. No significant regional mass effect. No second similar lesion identified elsewhere although there is a punctate perhaps dystrophic calcification in the right frontal lobe at the gray-white matter junction (series 4, image 26). No superimposed intracranial hemorrhage or mass effect. Basilar cisterns are patent. Cavum septum pellucidum variant. No ventriculomegaly. Hypodensity in the right caudate nucleus compatible with chronic small vessel disease. No acute cortically based infarct identified. Vascular: Calcified atherosclerosis at the skull base. No suspicious intracranial vascular hyperdensity. Skull: No skull fracture identified. Bone mineralization appears within normal limits for age. Sinuses/Orbits: Mild to moderate anterior ethmoid and frontoethmoidal recess mucosal thickening. Other paranasal sinuses are clear. The left tympanic cavity and mastoids are clear. There is sclerosis of the right mastoids. There is asymmetric thickening of the right tympanic membrane. Other: Visualized orbit soft tissues are within normal limits. Mild left posterosuperior convexity scalp contusion or hematoma (series 6, image 76) with intact underlying calvarium. No other scout soft tissue injury identified. CT CERVICAL SPINE FINDINGS Alignment: Preserved cervical lordosis. Cervicothoracic junction alignment is within normal limits. There is trace anterolisthesis of C3 on C4 and C4 on C5 with associated chronic facet arthropathy.  Bilateral posterior element alignment is within normal limits. Skull base and vertebrae: Visualized skull base is intact. No  atlanto-occipital dissociation. No cervical spine fracture identified. There is heterogeneous bone mineralization in the cervical spine, including small lucent lesions in the right C4 (coronal image 31) and left C6 (coronal image 36) levels. Soft tissues and spinal canal: No prevertebral fluid or swelling. No visible canal hematoma. Ligamentous hypertrophy about the odontoid. Calcified carotid and vertebral artery atherosclerosis. Evidence of previous bilateral carotid endarterectomy. Extensive calcified proximal great vessel atherosclerosis at the visible thoracic inlet. Disc levels: Moderate to severe chronic facet degeneration greater on the right from the C3 to the C6 level. Disc bulging. Multilevel ligament flavum hypertrophy suspected. Up to mild degenerative cervical spinal stenosis at C3-C4. Upper chest: Visible upper thoracic levels appear intact. Chest findings today are reported separately. IMPRESSION: 1. Abnormal anterior left frontal lobe, but the appearance does not resemble typical traumatic injury to the brain and is instead suspicious in this clinical setting for a 2 cm brain mass with mild surrounding vasogenic edema. Brain MRI without and with contrast when possible would characterize further. 2. No other acute intracranial abnormality is evident. Mild chronic small vessel disease in the right basal ganglia. 3.  No acute fracture or listhesis identified in the cervical spine. 4. Heterogeneous bone mineralization in the cervical spine could be degenerative in nature but early osseous metastatic disease is difficult to exclude. The head CT findings were reviewed in person with Dr. Doreen Salvage on 08/22/2016 at 1557 hours. Electronically Signed   By: Genevie Ann M.D.   On: 08/22/2016 16:23   Ct Chest W Contrast  Result Date: 08/22/2016 CLINICAL DATA:  Multiple trauma. The patient fell 12 feet from a ladder. EXAM: CT CHEST, ABDOMEN, AND PELVIS WITH CONTRAST TECHNIQUE: Multidetector CT imaging of the chest,  abdomen and pelvis was performed following the standard protocol during bolus administration of intravenous contrast. CONTRAST:  100 cc Omnipaque 300 COMPARISON:  Pelvic radiograph dated 08/22/2016 and chest x-ray dated 08/22/2016 FINDINGS: CT CHEST FINDINGS Cardiovascular: Extensive aortic atherosclerosis. Extensive coronary artery calcifications. Prior CABG. Heart size is normal. Mediastinum/Nodes: 3.7 x 2.3 cm enlarged subcarinal lymph node. Thyroid gland, trachea, and esophagus demonstrate no significant findings. Lungs/Pleura: There is a spiculated 2.0 x 2.8 cm mass in the right middle lobe with soft tissue stranding to the periphery, consistent with a primary carcinoma the lung. There is a vague 7 mm area of abnormal density in the posterior aspect of the left upper lobe on image 47 of series 3. There are 2 small nodular densities in the right upper lobe on image number 31 of series 3, 1 is 4 mm and the other is 2 mm. Musculoskeletal: No fractures or other acute abnormalities. CT ABDOMEN PELVIS FINDINGS Hepatobiliary: No focal liver abnormality is seen. Status post cholecystectomy. No biliary dilatation. Pancreas: Unremarkable. No pancreatic ductal dilatation or surrounding inflammatory changes. Spleen: No splenic injury or perisplenic hematoma. Adrenals/Urinary Tract: Adrenal glands are unremarkable. Kidneys are normal, without renal calculi, focal lesion, or hydronephrosis. There is retroperitoneal hemorrhage around the left side of the bladder. Tear is factor Maxwell findings no hemorrhage contrecoup never mind anterior to a diameter question MR plaques are Stomach/Bowel: Stomach is within normal limits. Appendix appears normal. No evidence of bowel wall thickening, distention, or inflammatory changes. Vascular/Lymphatic: There is retroperitoneal hemorrhage in the pelvis particularly adjacent to the left common iliac artery. I suspect the bleeding is from the left iliac vein. There is blood in  the  retroperitoneum anterior to the prostate gland and bladder. No appreciable free intraperitoneal hemorrhage. Hemorrhagic contusion in the left buttock. Reproductive: Slight enlargement of the prostate gland. Other: There is hemorrhage along the left iliopsoas muscle. Musculoskeletal: There is diastases of the symphysis pubis and of the left sacroiliac joint. There are fractures of the left transverse processes of L2 through S1. IMPRESSION: 1. Disruption of the symphysis pubis and left sacroiliac joint with retroperitoneal hemorrhage in the left side of the pelvis and subcutaneous hemorrhage in the left buttock. I suspect there is an injury to the left external iliac vein. 2. Spiculated mass in the right middle lobe consistent with carcinoma the lung with metastatic adenopathy in the subcarinal region. 3. Several small pulmonary nodules which are too small to characterize but could represent metastases. 4. Fractures of the left transverse processes of L2 through S1. Electronically Signed   By: Lorriane Shire M.D.   On: 08/22/2016 16:19   Ct Cervical Spine Wo Contrast  Result Date: 08/22/2016 CLINICAL DATA:  76 year old male status post fall from ladder. Extensive pelvic fractures. Abnormal chest CT today revealing a spiculated right middle lobe lung mass. Initial encounter. EXAM: CT HEAD WITHOUT CONTRAST CT CERVICAL SPINE WITHOUT CONTRAST TECHNIQUE: Multidetector CT imaging of the head and cervical spine was performed following the standard protocol without intravenous contrast. Multiplanar CT image reconstructions of the cervical spine were also generated. COMPARISON:  CT chest abdomen and pelvis from today reported separately. [facility] Brain MRI 07/13/2004. FINDINGS: CT HEAD FINDINGS Brain: Rounded mixed hyper and hypodense somewhat masslike area in the anterior left frontal lobe estimated at 2 cm. Adjacent increased white matter hypodensity in a configuration suggestive of mild vasogenic edema. See  series 8, image 43. No significant regional mass effect. No second similar lesion identified elsewhere although there is a punctate perhaps dystrophic calcification in the right frontal lobe at the gray-white matter junction (series 4, image 26). No superimposed intracranial hemorrhage or mass effect. Basilar cisterns are patent. Cavum septum pellucidum variant. No ventriculomegaly. Hypodensity in the right caudate nucleus compatible with chronic small vessel disease. No acute cortically based infarct identified. Vascular: Calcified atherosclerosis at the skull base. No suspicious intracranial vascular hyperdensity. Skull: No skull fracture identified. Bone mineralization appears within normal limits for age. Sinuses/Orbits: Mild to moderate anterior ethmoid and frontoethmoidal recess mucosal thickening. Other paranasal sinuses are clear. The left tympanic cavity and mastoids are clear. There is sclerosis of the right mastoids. There is asymmetric thickening of the right tympanic membrane. Other: Visualized orbit soft tissues are within normal limits. Mild left posterosuperior convexity scalp contusion or hematoma (series 6, image 76) with intact underlying calvarium. No other scout soft tissue injury identified. CT CERVICAL SPINE FINDINGS Alignment: Preserved cervical lordosis. Cervicothoracic junction alignment is within normal limits. There is trace anterolisthesis of C3 on C4 and C4 on C5 with associated chronic facet arthropathy. Bilateral posterior element alignment is within normal limits. Skull base and vertebrae: Visualized skull base is intact. No atlanto-occipital dissociation. No cervical spine fracture identified. There is heterogeneous bone mineralization in the cervical spine, including small lucent lesions in the right C4 (coronal image 31) and left C6 (coronal image 36) levels. Soft tissues and spinal canal: No prevertebral fluid or swelling. No visible canal hematoma. Ligamentous hypertrophy about  the odontoid. Calcified carotid and vertebral artery atherosclerosis. Evidence of previous bilateral carotid endarterectomy. Extensive calcified proximal great vessel atherosclerosis at the visible thoracic inlet. Disc levels: Moderate to severe chronic facet degeneration greater  on the right from the C3 to the C6 level. Disc bulging. Multilevel ligament flavum hypertrophy suspected. Up to mild degenerative cervical spinal stenosis at C3-C4. Upper chest: Visible upper thoracic levels appear intact. Chest findings today are reported separately. IMPRESSION: 1. Abnormal anterior left frontal lobe, but the appearance does not resemble typical traumatic injury to the brain and is instead suspicious in this clinical setting for a 2 cm brain mass with mild surrounding vasogenic edema. Brain MRI without and with contrast when possible would characterize further. 2. No other acute intracranial abnormality is evident. Mild chronic small vessel disease in the right basal ganglia. 3.  No acute fracture or listhesis identified in the cervical spine. 4. Heterogeneous bone mineralization in the cervical spine could be degenerative in nature but early osseous metastatic disease is difficult to exclude. The head CT findings were reviewed in person with Dr. Doreen Salvage on 08/22/2016 at 1557 hours. Electronically Signed   By: Genevie Ann M.D.   On: 08/22/2016 16:23   Ct Abdomen Pelvis W Contrast  Result Date: 08/22/2016 CLINICAL DATA:  Multiple trauma. The patient fell 12 feet from a ladder. EXAM: CT CHEST, ABDOMEN, AND PELVIS WITH CONTRAST TECHNIQUE: Multidetector CT imaging of the chest, abdomen and pelvis was performed following the standard protocol during bolus administration of intravenous contrast. CONTRAST:  100 cc Omnipaque 300 COMPARISON:  Pelvic radiograph dated 08/22/2016 and chest x-ray dated 08/22/2016 FINDINGS: CT CHEST FINDINGS Cardiovascular: Extensive aortic atherosclerosis. Extensive coronary artery calcifications. Prior  CABG. Heart size is normal. Mediastinum/Nodes: 3.7 x 2.3 cm enlarged subcarinal lymph node. Thyroid gland, trachea, and esophagus demonstrate no significant findings. Lungs/Pleura: There is a spiculated 2.0 x 2.8 cm mass in the right middle lobe with soft tissue stranding to the periphery, consistent with a primary carcinoma the lung. There is a vague 7 mm area of abnormal density in the posterior aspect of the left upper lobe on image 47 of series 3. There are 2 small nodular densities in the right upper lobe on image number 31 of series 3, 1 is 4 mm and the other is 2 mm. Musculoskeletal: No fractures or other acute abnormalities. CT ABDOMEN PELVIS FINDINGS Hepatobiliary: No focal liver abnormality is seen. Status post cholecystectomy. No biliary dilatation. Pancreas: Unremarkable. No pancreatic ductal dilatation or surrounding inflammatory changes. Spleen: No splenic injury or perisplenic hematoma. Adrenals/Urinary Tract: Adrenal glands are unremarkable. Kidneys are normal, without renal calculi, focal lesion, or hydronephrosis. There is retroperitoneal hemorrhage around the left side of the bladder. Tear is factor Maxwell findings no hemorrhage contrecoup never mind anterior to a diameter question MR plaques are Stomach/Bowel: Stomach is within normal limits. Appendix appears normal. No evidence of bowel wall thickening, distention, or inflammatory changes. Vascular/Lymphatic: There is retroperitoneal hemorrhage in the pelvis particularly adjacent to the left common iliac artery. I suspect the bleeding is from the left iliac vein. There is blood in the retroperitoneum anterior to the prostate gland and bladder. No appreciable free intraperitoneal hemorrhage. Hemorrhagic contusion in the left buttock. Reproductive: Slight enlargement of the prostate gland. Other: There is hemorrhage along the left iliopsoas muscle. Musculoskeletal: There is diastases of the symphysis pubis and of the left sacroiliac joint. There  are fractures of the left transverse processes of L2 through S1. IMPRESSION: 1. Disruption of the symphysis pubis and left sacroiliac joint with retroperitoneal hemorrhage in the left side of the pelvis and subcutaneous hemorrhage in the left buttock. I suspect there is an injury to the left external iliac vein.  2. Spiculated mass in the right middle lobe consistent with carcinoma the lung with metastatic adenopathy in the subcarinal region. 3. Several small pulmonary nodules which are too small to characterize but could represent metastases. 4. Fractures of the left transverse processes of L2 through S1. Electronically Signed   By: Lorriane Shire M.D.   On: 08/22/2016 16:19   Dg Pelvis Portable  Result Date: 08/22/2016 CLINICAL DATA:  Fall from roof.  Right flank and low back pain. EXAM: PORTABLE PELVIS 1-2 VIEWS COMPARISON:  07/13/2004 CT angiogram of the abdomen and pelvis. FINDINGS: There is abnormal widening at the pubic symphysis. There is mild 5 mm elevation of the right pubic bone relative to the left pubic bone at the pubic symphysis. No appreciable widening at the sacroiliac joints. Questionable vertical lucencies in the superior lateral sacral ala bilaterally. No evidence of hip dislocation on this single frontal view. No suspicious focal osseous lesion. Iliofemoral atherosclerosis bilaterally. IMPRESSION: 1. Mild diastasis and offset at the pubic symphysis suspicious for symphyseal disruption. 2. Questionable vertical lucencies in the superolateral sacral ala bilaterally, cannot exclude nondisplaced sacral fractures. No sacroiliac joint diastasis. 3. Recommend correlation with bony protocol pelvis CT. Electronically Signed   By: Ilona Sorrel M.D.   On: 08/22/2016 15:36   Dg Chest Port 1 View  Result Date: 08/22/2016 CLINICAL DATA:  Right flank and lower back pain after falling from a ladder 12 feet. EXAM: PORTABLE CHEST 1 VIEW COMPARISON:  Chest x-ray dated 07/17/2013 FINDINGS: The heart size and  pulmonary vascularity are normal and the lungs are clear. CABG. Aortic atherosclerosis. No acute bone abnormality. IMPRESSION: No acute abnormalities.  CABG. Aortic atherosclerosis. Electronically Signed   By: Lorriane Shire M.D.   On: 08/22/2016 15:33    Anti-infectives: Anti-infectives    None      Assessment/Plan: s/p Procedure(s): OPEN REDUCTION INTERNAL FIXATION (ORIF) PELVIC FRACTURE; plate on front, SI screw on the back Advance diet NPO after midnight.  LOS: 2 days   Kathryne Eriksson. Dahlia Bailiff, MD, FACS [phone] Trauma Surgeon 08/24/2016

## 2016-08-24 NOTE — Anesthesia Preprocedure Evaluation (Addendum)
Anesthesia Evaluation  Patient identified by MRN, date of birth, ID band Patient awake    Reviewed: Allergy & Precautions, NPO status , Patient's Chart, lab work & pertinent test results  Airway Mallampati: II  TM Distance: >3 FB Neck ROM: Full    Dental  (+) Edentulous Upper, Edentulous Lower   Pulmonary COPD, former smoker,    breath sounds clear to auscultation       Cardiovascular hypertension, Pt. on medications and Pt. on home beta blockers + CAD, + Past MI, + CABG and + Peripheral Vascular Disease (s/p AAA repair and left carotid endarterectomy)   Rhythm:Regular Rate:Normal  12/2014: Left ventricle: The cavity size was normal. Systolic function was   normal. The estimated ejection fraction was in the range of 55%   to 60%. Wall motion was normal; there were no regional wall   motion abnormalities. There was an increased relative   contribution of atrial contraction to ventricular filling.   Doppler parameters are consistent with abnormal left ventricular   relaxation (grade 1 diastolic dysfunction). - Aortic valve: Mildly calcified annulus. Trileaflet; normal   thickness, mildly calcified leaflets. Transvalvular velocity was   within the normal range. There was no stenosis. - Mitral valve: Calcified annulus. There was mild regurgitation. - Pulmonic valve: There was trivial regurgitation.   Neuro/Psych  Headaches,    GI/Hepatic Neg liver ROS, hiatal hernia, GERD  ,  Endo/Other  Hypothyroidism   Renal/GU Renal disease     Musculoskeletal   Abdominal   Peds  Hematology  (+) Blood dyscrasia (THrombocytopenia), anemia ,   Anesthesia Other Findings   Reproductive/Obstetrics                            Lab Results  Component Value Date   WBC 6.5 08/25/2016   HGB 11.3 (L) 08/25/2016   HCT 32.3 (L) 08/25/2016   MCV 85.7 08/25/2016   PLT 94 (L) 08/25/2016   Lab Results  Component Value  Date   CREATININE 0.62 08/25/2016   BUN 10 08/25/2016   NA 138 08/25/2016   K 3.3 (L) 08/25/2016   CL 109 08/25/2016   CO2 21 (L) 08/25/2016    Anesthesia Physical Anesthesia Plan  ASA: III  Anesthesia Plan: General   Post-op Pain Management:    Induction: Intravenous  Airway Management Planned: Oral ETT  Additional Equipment:   Intra-op Plan:   Post-operative Plan: Extubation in OR and Possible Post-op intubation/ventilation  Informed Consent: I have reviewed the patients History and Physical, chart, labs and discussed the procedure including the risks, benefits and alternatives for the proposed anesthesia with the patient or authorized representative who has indicated his/her understanding and acceptance.   Dental advisory given  Plan Discussed with:   Anesthesia Plan Comments:        Anesthesia Quick Evaluation

## 2016-08-24 NOTE — Progress Notes (Signed)
Orthopedic Trauma Service Progress Note    Subjective:  Patient reports pain as mild.    Binder removed yesterday by Dr. Marcelino Scot, pt remains comfortable No other concerns   Lactic acid normalized   ROS As above   Objective:   VITALS:   Vitals:   08/24/16 0800 08/24/16 0830 08/24/16 0900 08/24/16 1000  BP: (!) 168/71 (!) 158/59 (!) 165/61 (!) 113/52  Pulse: 76 66 68 77  Resp: (!) 27 (!) 26 20 (!) 23  Temp: 97.8 F (36.6 C)     TempSrc: Oral     SpO2: 97% 94% 94% 95%  Weight:      Height:        Intake/Output      03/06 0701 - 03/07 0700 03/07 0701 - 03/08 0700   I.V. (mL/kg) 2400 (33.5) 300 (4.2)   Total Intake(mL/kg) 2400 (33.5) 300 (4.2)   Urine (mL/kg/hr) 975 (0.6) 170 (0.6)   Total Output 975 170   Net +1425 +130          LABS  Results for orders placed or performed during the hospital encounter of 08/22/16 (from the past 24 hour(s))  CBC     Status: Abnormal   Collection Time: 08/23/16  3:06 PM  Result Value Ref Range   WBC 6.0 4.0 - 10.5 K/uL   RBC 3.89 (L) 4.22 - 5.81 MIL/uL   Hemoglobin 11.6 (L) 13.0 - 17.0 g/dL   HCT 33.5 (L) 39.0 - 52.0 %   MCV 86.1 78.0 - 100.0 fL   MCH 29.8 26.0 - 34.0 pg   MCHC 34.6 30.0 - 36.0 g/dL   RDW 14.4 11.5 - 15.5 %   Platelets 96 (L) 150 - 400 K/uL  Lactic acid, plasma     Status: None   Collection Time: 08/23/16  4:51 PM  Result Value Ref Range   Lactic Acid, Venous 0.9 0.5 - 1.9 mmol/L  CBC     Status: Abnormal   Collection Time: 08/24/16  4:51 AM  Result Value Ref Range   WBC 7.1 4.0 - 10.5 K/uL   RBC 3.88 (L) 4.22 - 5.81 MIL/uL   Hemoglobin 11.6 (L) 13.0 - 17.0 g/dL   HCT 33.3 (L) 39.0 - 52.0 %   MCV 85.8 78.0 - 100.0 fL   MCH 29.9 26.0 - 34.0 pg   MCHC 34.8 30.0 - 36.0 g/dL   RDW 14.5 11.5 - 15.5 %   Platelets 97 (L) 150 - 400 K/uL  Basic metabolic panel     Status: Abnormal   Collection Time: 08/24/16  4:51 AM  Result Value Ref Range   Sodium 138 135 - 145 mmol/L   Potassium  3.4 (L) 3.5 - 5.1 mmol/L   Chloride 108 101 - 111 mmol/L   CO2 22 22 - 32 mmol/L   Glucose, Bld 107 (H) 65 - 99 mg/dL   BUN 16 6 - 20 mg/dL   Creatinine, Ser 0.73 0.61 - 1.24 mg/dL   Calcium 8.6 (L) 8.9 - 10.3 mg/dL   GFR calc non Af Amer >60 >60 mL/min   GFR calc Af Amer >60 >60 mL/min   Anion gap 8 5 - 15  Urinalysis, Routine w reflex microscopic     Status: Abnormal   Collection Time: 08/24/16  8:55 AM  Result Value Ref Range   Color, Urine RED (A) YELLOW   APPearance TURBID (A) CLEAR   Specific Gravity, Urine  1.005 - 1.030    TEST NOT REPORTED DUE  TO COLOR INTERFERENCE OF URINE PIGMENT   pH  5.0 - 8.0    TEST NOT REPORTED DUE TO COLOR INTERFERENCE OF URINE PIGMENT   Glucose, UA (A) NEGATIVE mg/dL    TEST NOT REPORTED DUE TO COLOR INTERFERENCE OF URINE PIGMENT   Hgb urine dipstick (A) NEGATIVE    TEST NOT REPORTED DUE TO COLOR INTERFERENCE OF URINE PIGMENT   Bilirubin Urine (A) NEGATIVE    TEST NOT REPORTED DUE TO COLOR INTERFERENCE OF URINE PIGMENT   Ketones, ur (A) NEGATIVE mg/dL    TEST NOT REPORTED DUE TO COLOR INTERFERENCE OF URINE PIGMENT   Protein, ur (A) NEGATIVE mg/dL    TEST NOT REPORTED DUE TO COLOR INTERFERENCE OF URINE PIGMENT   Nitrite (A) NEGATIVE    TEST NOT REPORTED DUE TO COLOR INTERFERENCE OF URINE PIGMENT   Leukocytes, UA (A) NEGATIVE    TEST NOT REPORTED DUE TO COLOR INTERFERENCE OF URINE PIGMENT   RBC / HPF TOO NUMEROUS TO COUNT 0 - 5 RBC/hpf   WBC, UA TOO NUMEROUS TO COUNT 0 - 5 WBC/hpf   Bacteria, UA MANY (A) NONE SEEN   Squamous Epithelial / LPF 0-5 (A) NONE SEEN   Mucous PRESENT      PHYSICAL EXAM:   Gen: appear swell, NAD  Pelvis: binder appears to have been loosely put back on, completely unstrapped at bedside again this am   B LEx motor and sensory functions intact  + peripheral pulses   Assessment/Plan: * Surgery Date in Future *   Active Problems:   Fall   Closed pelvic ring fracture (HCC)   Disorder of left sacroiliac  joint   POD/HD#: 2   76 y/o male s/p fall from about 75f  - fall  - APC 2 pelvic ring fracture with L SI diastasis   OR tomorrow for anterior ring plating and L SI screw  NWB L leg post op   WBAT R leg for transfers post op   Will be bed to chair for 6 weeks   - DVT/PE prophylaxis   SCDs for now   Was on plavix PTA for PVD   - Dispo   OR tomorrow for fixation of pelvic ring   KJari Pigg PA-C [facility] [phone](P) [phone](O) 08/24/2016, 10:43 AM

## 2016-08-25 ENCOUNTER — Inpatient Hospital Stay (HOSPITAL_COMMUNITY): Payer: Medicare Other

## 2016-08-25 ENCOUNTER — Encounter (HOSPITAL_COMMUNITY): Admission: EM | Disposition: A | Discharge status: Rehabilitation Facility | Payer: Self-pay | Source: Home / Self Care

## 2016-08-25 ENCOUNTER — Inpatient Hospital Stay (HOSPITAL_COMMUNITY): Payer: Medicare Other | Admitting: Anesthesiology

## 2016-08-25 ENCOUNTER — Encounter (HOSPITAL_COMMUNITY): Payer: Self-pay | Admitting: Certified Registered Nurse Anesthetist

## 2016-08-25 HISTORY — PX: ORIF PELVIC FRACTURE: SHX2128

## 2016-08-25 LAB — CBC WITH DIFFERENTIAL/PLATELET
BASOS PCT: 0 %
Basophils Absolute: 0 10*3/uL (ref 0.0–0.1)
Eosinophils Absolute: 0.1 10*3/uL (ref 0.0–0.7)
Eosinophils Relative: 2 %
HEMATOCRIT: 32.3 % — AB (ref 39.0–52.0)
Hemoglobin: 11.3 g/dL — ABNORMAL LOW (ref 13.0–17.0)
Lymphocytes Relative: 18 %
Lymphs Abs: 1.2 10*3/uL (ref 0.7–4.0)
MCH: 30 pg (ref 26.0–34.0)
MCHC: 35 g/dL (ref 30.0–36.0)
MCV: 85.7 fL (ref 78.0–100.0)
MONO ABS: 0.4 10*3/uL (ref 0.1–1.0)
MONOS PCT: 6 %
NEUTROS ABS: 4.8 10*3/uL (ref 1.7–7.7)
Neutrophils Relative %: 74 %
Platelets: 94 10*3/uL — ABNORMAL LOW (ref 150–400)
RBC: 3.77 MIL/uL — ABNORMAL LOW (ref 4.22–5.81)
RDW: 14.2 % (ref 11.5–15.5)
WBC: 6.5 10*3/uL (ref 4.0–10.5)

## 2016-08-25 LAB — BASIC METABOLIC PANEL
Anion gap: 8 (ref 5–15)
BUN: 10 mg/dL (ref 6–20)
CALCIUM: 8.2 mg/dL — AB (ref 8.9–10.3)
CO2: 21 mmol/L — AB (ref 22–32)
CREATININE: 0.62 mg/dL (ref 0.61–1.24)
Chloride: 109 mmol/L (ref 101–111)
GFR calc non Af Amer: >60 mL/min (ref >60)
GLUCOSE: 105 mg/dL — AB (ref 65–99)
Potassium: 3.3 mmol/L — ABNORMAL LOW (ref 3.5–5.1)
Sodium: 138 mmol/L (ref 135–145)

## 2016-08-25 SURGERY — OPEN REDUCTION INTERNAL FIXATION (ORIF) PELVIC FRACTURE
Anesthesia: General | Site: Pelvis | Laterality: Left

## 2016-08-25 MED ORDER — HYDROMORPHONE HCL 1 MG/ML IJ SOLN
INTRAMUSCULAR | Status: AC
  Filled 2016-08-25: qty 1

## 2016-08-25 MED ORDER — LIDOCAINE 2% (20 MG/ML) 5 ML SYRINGE
INTRAMUSCULAR | Status: AC
  Filled 2016-08-25: qty 5

## 2016-08-25 MED ORDER — PROPOFOL 10 MG/ML IV BOLUS
INTRAVENOUS | Status: AC
  Filled 2016-08-25: qty 20

## 2016-08-25 MED ORDER — SUGAMMADEX SODIUM 200 MG/2ML IV SOLN
INTRAVENOUS | Status: AC
  Filled 2016-08-25: qty 2

## 2016-08-25 MED ORDER — ROCURONIUM BROMIDE 50 MG/5ML IV SOSY
PREFILLED_SYRINGE | INTRAVENOUS | Status: AC
Start: 2016-08-25 — End: 2016-08-25
  Filled 2016-08-25: qty 5

## 2016-08-25 MED ORDER — ONDANSETRON HCL 4 MG/2ML IJ SOLN
INTRAMUSCULAR | Status: DC | PRN
  Administered 2016-08-25: 4 mg via INTRAVENOUS

## 2016-08-25 MED ORDER — MIDAZOLAM HCL 2 MG/2ML IJ SOLN
INTRAMUSCULAR | Status: AC
  Filled 2016-08-25: qty 2

## 2016-08-25 MED ORDER — EPHEDRINE 5 MG/ML INJ
INTRAVENOUS | Status: AC
  Filled 2016-08-25: qty 10

## 2016-08-25 MED ORDER — SUCCINYLCHOLINE CHLORIDE 200 MG/10ML IV SOSY
PREFILLED_SYRINGE | INTRAVENOUS | Status: AC
  Filled 2016-08-25: qty 10

## 2016-08-25 MED ORDER — CEFAZOLIN SODIUM-DEXTROSE 2-4 GM/100ML-% IV SOLN
2.0000 g | Freq: Three times a day (TID) | INTRAVENOUS | Status: AC
  Administered 2016-08-25 – 2016-08-26 (×3): 2 g via INTRAVENOUS
  Filled 2016-08-25 (×3): qty 100

## 2016-08-25 MED ORDER — LIDOCAINE HCL (CARDIAC) 20 MG/ML IV SOLN
INTRAVENOUS | Status: DC | PRN
  Administered 2016-08-25: 60 mg via INTRAVENOUS

## 2016-08-25 MED ORDER — OXYCODONE-ACETAMINOPHEN 5-325 MG PO TABS
1.0000 | ORAL_TABLET | Freq: Four times a day (QID) | ORAL | Status: DC | PRN
  Administered 2016-08-27 – 2016-08-28 (×2): 1 via ORAL
  Filled 2016-08-25 (×2): qty 1

## 2016-08-25 MED ORDER — LACTATED RINGERS IV SOLN
INTRAVENOUS | Status: DC | PRN
  Administered 2016-08-25: 08:00:00 via INTRAVENOUS

## 2016-08-25 MED ORDER — SUGAMMADEX SODIUM 200 MG/2ML IV SOLN
INTRAVENOUS | Status: DC | PRN
  Administered 2016-08-25: 150 mg via INTRAVENOUS

## 2016-08-25 MED ORDER — SODIUM CHLORIDE 0.9 % IV BOLUS (SEPSIS)
500.0000 mL | Freq: Once | INTRAVENOUS | Status: AC
  Administered 2016-08-25: 500 mL via INTRAVENOUS

## 2016-08-25 MED ORDER — PHENYLEPHRINE 40 MCG/ML (10ML) SYRINGE FOR IV PUSH (FOR BLOOD PRESSURE SUPPORT)
PREFILLED_SYRINGE | INTRAVENOUS | Status: AC
  Filled 2016-08-25: qty 10

## 2016-08-25 MED ORDER — FENTANYL CITRATE (PF) 100 MCG/2ML IJ SOLN
INTRAMUSCULAR | Status: AC
Start: 2016-08-25 — End: 2016-08-25
  Filled 2016-08-25: qty 2

## 2016-08-25 MED ORDER — OXYCODONE HCL 5 MG PO TABS
5.0000 mg | ORAL_TABLET | Freq: Four times a day (QID) | ORAL | Status: DC | PRN
  Administered 2016-08-26 – 2016-08-28 (×6): 10 mg via ORAL
  Filled 2016-08-25 (×6): qty 2

## 2016-08-25 MED ORDER — PROPOFOL 10 MG/ML IV BOLUS
INTRAVENOUS | Status: DC | PRN
  Administered 2016-08-25: 130 mg via INTRAVENOUS

## 2016-08-25 MED ORDER — FENTANYL CITRATE (PF) 100 MCG/2ML IJ SOLN
INTRAMUSCULAR | Status: DC | PRN
  Administered 2016-08-25: 100 ug via INTRAVENOUS
  Administered 2016-08-25: 50 ug via INTRAVENOUS
  Administered 2016-08-25 (×2): 25 ug via INTRAVENOUS

## 2016-08-25 MED ORDER — PROMETHAZINE HCL 25 MG/ML IJ SOLN: 6.2500 mg | INTRAMUSCULAR | Status: DC | PRN

## 2016-08-25 MED ORDER — 0.9 % SODIUM CHLORIDE (POUR BTL) OPTIME
TOPICAL | Status: DC | PRN
  Administered 2016-08-25: 1000 mL

## 2016-08-25 MED ORDER — FENTANYL CITRATE (PF) 100 MCG/2ML IJ SOLN
INTRAMUSCULAR | Status: AC
  Filled 2016-08-25: qty 2

## 2016-08-25 MED ORDER — ROCURONIUM BROMIDE 100 MG/10ML IV SOLN
INTRAVENOUS | Status: DC | PRN
  Administered 2016-08-25: 20 mg via INTRAVENOUS
  Administered 2016-08-25 (×2): 10 mg via INTRAVENOUS
  Administered 2016-08-25: 40 mg via INTRAVENOUS

## 2016-08-25 MED ORDER — ROCURONIUM BROMIDE 50 MG/5ML IV SOSY
PREFILLED_SYRINGE | INTRAVENOUS | Status: AC
  Filled 2016-08-25: qty 5

## 2016-08-25 MED ORDER — HYDROMORPHONE HCL 1 MG/ML IJ SOLN
0.2500 mg | INTRAMUSCULAR | Status: DC | PRN
Start: 2016-08-25 — End: 2016-08-25
  Administered 2016-08-25 (×2): 0.5 mg via INTRAVENOUS

## 2016-08-25 MED ORDER — OXYCODONE HCL 5 MG PO TABS
ORAL_TABLET | ORAL | Status: AC
  Filled 2016-08-25: qty 1

## 2016-08-25 MED ORDER — PHENYLEPHRINE HCL 10 MG/ML IJ SOLN
INTRAVENOUS | Status: DC | PRN
  Administered 2016-08-25: 15 ug/min via INTRAVENOUS

## 2016-08-25 MED ORDER — ONDANSETRON HCL 4 MG/2ML IJ SOLN
INTRAMUSCULAR | Status: AC
Start: 2016-08-25 — End: 2016-08-25
  Filled 2016-08-25: qty 2

## 2016-08-25 MED ORDER — EPHEDRINE SULFATE 50 MG/ML IJ SOLN
INTRAMUSCULAR | Status: DC | PRN
  Administered 2016-08-25: 5 mg via INTRAVENOUS

## 2016-08-25 SURGICAL SUPPLY — 52 items
BIT DRILL AO MATTA 2.5MX230M (BIT) IMPLANT
BLADE CLIPPER SURG (BLADE) IMPLANT
BRUSH SCRUB DISP (MISCELLANEOUS) ×6 IMPLANT
DRAIN CHANNEL 15F RND FF W/TCR (WOUND CARE) IMPLANT
DRAPE C-ARM 42X72 X-RAY (DRAPES) ×3 IMPLANT
DRAPE C-ARMOR (DRAPES) ×3 IMPLANT
DRAPE LAPAROTOMY TRNSV 102X78 (DRAPE) ×3 IMPLANT
DRAPE SURG 17X23 STRL (DRAPES) ×1 IMPLANT
DRAPE U-SHAPE 47X51 STRL (DRAPES) ×1 IMPLANT
DRILL BIT AO MATTA 2.5MX230M (BIT) ×3
DRSG MEPILEX BORDER 4X4 (GAUZE/BANDAGES/DRESSINGS) ×2 IMPLANT
DRSG MEPILEX BORDER 4X8 (GAUZE/BANDAGES/DRESSINGS) ×2 IMPLANT
ELECT REM PT RETURN 9FT ADLT (ELECTROSURGICAL) ×3
ELECTRODE REM PT RTRN 9FT ADLT (ELECTROSURGICAL) ×1 IMPLANT
EVACUATOR SILICONE 100CC (DRAIN) IMPLANT
GLOVE BIO SURGEON STRL SZ7.5 (GLOVE) ×3 IMPLANT
GLOVE BIO SURGEON STRL SZ8 (GLOVE) ×3 IMPLANT
GLOVE BIOGEL PI IND STRL 7.5 (GLOVE) ×1 IMPLANT
GLOVE BIOGEL PI IND STRL 8 (GLOVE) ×1 IMPLANT
GLOVE BIOGEL PI INDICATOR 7.5 (GLOVE) ×2
GLOVE BIOGEL PI INDICATOR 8 (GLOVE) ×2
GOWN STRL REUS W/ TWL LRG LVL3 (GOWN DISPOSABLE) ×2 IMPLANT
GOWN STRL REUS W/ TWL XL LVL3 (GOWN DISPOSABLE) ×1 IMPLANT
GOWN STRL REUS W/TWL LRG LVL3 (GOWN DISPOSABLE) ×6
GOWN STRL REUS W/TWL XL LVL3 (GOWN DISPOSABLE) ×3
KIT BASIN OR (CUSTOM PROCEDURE TRAY) ×3 IMPLANT
KIT ROOM TURNOVER OR (KITS) ×3 IMPLANT
MANIFOLD NEPTUNE II (INSTRUMENTS) ×3 IMPLANT
NS IRRIG 1000ML POUR BTL (IV SOLUTION) ×6 IMPLANT
PACK TOTAL JOINT (CUSTOM PROCEDURE TRAY) ×3 IMPLANT
PAD ARMBOARD 7.5X6 YLW CONV (MISCELLANEOUS) ×6 IMPLANT
PLATE SYMPHYSIS 92.5M 6H (Plate) ×2 IMPLANT
SCREW CORTEX ST MATTA 3.5X22MM (Screw) ×4 IMPLANT
SCREW CORTEX ST MATTA 3.5X26MM (Screw) ×2 IMPLANT
SCREW CORTEX ST MATTA 3.5X30MM (Screw) ×2 IMPLANT
SCREW CORTEX ST MATTA 3.5X50MM (Screw) ×4 IMPLANT
SCREW CORTEX ST MATTA 3.5X80MM (Screw) ×2 IMPLANT
SPONGE LAP 18X18 X RAY DECT (DISPOSABLE) ×2 IMPLANT
STAPLER VISISTAT 35W (STAPLE) ×3 IMPLANT
SUCTION FRAZIER HANDLE 10FR (MISCELLANEOUS) ×2
SUCTION TUBE FRAZIER 10FR DISP (MISCELLANEOUS) ×1 IMPLANT
SUT ETHILON 3 0 PS 1 (SUTURE) ×4 IMPLANT
SUT VIC AB 0 CT1 27 (SUTURE) ×3
SUT VIC AB 0 CT1 27XBRD ANBCTR (SUTURE) ×2 IMPLANT
SUT VIC AB 1 CT1 18XCR BRD 8 (SUTURE) ×2 IMPLANT
SUT VIC AB 1 CT1 8-18 (SUTURE) ×3
SUT VIC AB 2-0 CT1 27 (SUTURE) ×3
SUT VIC AB 2-0 CT1 TAPERPNT 27 (SUTURE) ×2 IMPLANT
TOWEL OR 17X24 6PK STRL BLUE (TOWEL DISPOSABLE) ×3 IMPLANT
TOWEL OR 17X26 10 PK STRL BLUE (TOWEL DISPOSABLE) ×6 IMPLANT
TRAY FOLEY W/METER SILVER 16FR (SET/KITS/TRAYS/PACK) IMPLANT
WATER STERILE IRR 1000ML POUR (IV SOLUTION) ×4 IMPLANT

## 2016-08-25 NOTE — Anesthesia Postprocedure Evaluation (Signed)
Anesthesia Post Note  Patient: [name]  Procedure(s) Performed: Procedure(s) (LRB): OPEN REDUCTION INTERNAL FIXATION (ORIF) PELVIC FRACTURE; plate on front, SI screw on the back (Left)  Patient location during evaluation: PACU Anesthesia Type: General Level of consciousness: awake and alert Pain management: pain level controlled Vital Signs Assessment: post-procedure vital signs reviewed and stable Respiratory status: spontaneous breathing, nonlabored ventilation, respiratory function stable and patient connected to nasal cannula oxygen Cardiovascular status: blood pressure returned to baseline and stable Postop Assessment: no signs of nausea or vomiting Anesthetic complications: no       Last Vitals:  Vitals:   08/25/16 1147 08/25/16 1200  BP: (!) 109/59 (!) 90/44  Pulse:  92  Resp: (!) 0 (!) 21  Temp:      Last Pain:  Vitals:   08/25/16 1200  TempSrc:   PainSc: 3                  [name] [name]

## 2016-08-25 NOTE — Progress Notes (Signed)
I have seen and examined the patient. No new findings or questions. Consent on chart for repair of pelvic ring.  Rozanna Box, MD 08/25/2016 7:59 AM

## 2016-08-25 NOTE — Progress Notes (Signed)
Day of Surgery  Subjective: Back from surgery  Objective: Vital signs in last 24 hours: Temp:  [97 F (36.1 C)-98.1 F (36.7 C)] 97 F (36.1 C) (03/08 1133) Pulse Rate:  [57-110] 92 (03/08 1200) Resp:  [0-31] 21 (03/08 1200) BP: (90-177)/(38-98) 90/44 (03/08 1200) SpO2:  [89 %-100 %] 89 % (03/08 1200) Last BM Date:  (PTA)  Intake/Output from previous day: 03/07 0701 - 03/08 0700 In: 2400 [I.V.:2400] Out: 2195 [Urine:2195] Intake/Output this shift: Total I/O In: 900 [I.V.:900] Out: 870 [Urine:620; Blood:250]  General appearance: cooperative Resp: clear to auscultation bilaterally Cardio: regular rate and rhythm GI: soft, NT, +BS Extremities: calves soft  Lab Results: CBC   Recent Labs  08/24/16 0451 08/25/16 0252  WBC 7.1 6.5  HGB 11.6* 11.3*  HCT 33.3* 32.3*  PLT 97* 94*   BMET  Recent Labs  08/24/16 0451 08/25/16 0252  NA 138 138  K 3.4* 3.3*  CL 108 109  CO2 22 21*  GLUCOSE 107* 105*  BUN 16 10  CREATININE 0.73 0.62  CALCIUM 8.6* 8.2*   PT/INR  Recent Labs  08/22/16 1501 08/22/16 1624  LABPROT 15.0 16.9*  INR 1.17 1.36   ABG No results for input(s): PHART, HCO3 in the last 72 hours.  Invalid input(s): PCO2, PO2  Studies/Results: Dg Pelvis Portable  Result Date: 08/25/2016 CLINICAL DATA:  Status post open reduction internal fixation of pelvic ring fracture. EXAM: PORTABLE PELVIS 1-2 VIEWS COMPARISON:  Radiographs of same day. FINDINGS: Status post surgical fixation of pubic symphysis. Also noted is surgical screw placement involving the left sacroiliac joint. No other fracture or dislocation is noted. Hip joints appear normal. IMPRESSION: Status post surgical internal fixation of pubic symphysis and left sacroiliac joint. Electronically Signed   By: Marijo Conception, M.D.   On: 08/25/2016 11:45   Dg Pelvis Comp Min 3v  Result Date: 08/25/2016 CLINICAL DATA:  ORIF pelvic fracture EXAM: JUDET PELVIS - 3+ VIEW; DG C-ARM 61-120 MIN COMPARISON:   None. FLUOROSCOPY TIME:  53 seconds FINDINGS: Six intraoperative fluoroscopic spot images are provided. Interval plate and screw ORIF of symphyseal diastases. Interval placement of a cannulated screw transfixing the left sacroiliac joint. IMPRESSION: 1. Interval ORIF of the pubic symphysis and left SI joint. Electronically Signed   By: Kathreen Devoid   On: 08/25/2016 10:04   Dg C-arm 1-60 Min  Result Date: 08/25/2016 CLINICAL DATA:  ORIF pelvic fracture EXAM: JUDET PELVIS - 3+ VIEW; DG C-ARM 61-120 MIN COMPARISON:  None. FLUOROSCOPY TIME:  53 seconds FINDINGS: Six intraoperative fluoroscopic spot images are provided. Interval plate and screw ORIF of symphyseal diastases. Interval placement of a cannulated screw transfixing the left sacroiliac joint. IMPRESSION: 1. Interval ORIF of the pubic symphysis and left SI joint. Electronically Signed   By: Kathreen Devoid   On: 08/25/2016 10:04    Anti-infectives: Anti-infectives    Start     Dose/Rate Route Frequency Ordered Stop   08/25/16 1600  ceFAZolin (ANCEF) IVPB 2g/100 mL premix     2 g 200 mL/hr over 30 Minutes Intravenous Every 8 hours 08/25/16 1155 08/26/16 1559   08/24/16 1045  ceFAZolin (ANCEF) IVPB 2g/100 mL premix     2 g 200 mL/hr over 30 Minutes Intravenous To Surgery 08/24/16 1042 08/25/16 0830      Assessment/Plan: Fall from ladder Open book pelvic FX with L SI disruption - S/P SI screw and anterior plate by Dr. Marcelino Scot 3/8 ABL anemia - improved Thrombocytopenia - F/U in  AM FEN - adv diet RML mass/brain mass - likely lung CA with met, will need further eval as outpatient.  VTE - PAS, cannot give Lovenox with PLTs < 100k Dispo - ICU I spoke with his son  LOS: 3 days    Georganna Skeans, MD, MPH, FACS Trauma: [phone] General Surgery: [phone]  3/8/2018Patient ID: [name] [name], male   DOB: 12-Sep-1940, 76 y.o.   MRN: [id number]

## 2016-08-25 NOTE — Anesthesia Procedure Notes (Signed)
Procedure Name: Intubation Date/Time: 08/25/2016 8:23 AM Performed by: Shirlyn Goltz Pre-anesthesia Checklist: Patient identified, Emergency Drugs available, Suction available and Patient being monitored Patient Re-evaluated:Patient Re-evaluated prior to inductionOxygen Delivery Method: Circle system utilized Preoxygenation: Pre-oxygenation with 100% oxygen Intubation Type: IV induction Ventilation: Mask ventilation without difficulty and Oral airway inserted - appropriate to patient size Laryngoscope Size: Mac and 3 Grade View: Grade I Tube type: Oral Tube size: 7.5 mm Number of attempts: 1 Airway Equipment and Method: Stylet Placement Confirmation: ETT inserted through vocal cords under direct vision,  positive ETCO2 and breath sounds checked- equal and bilateral Secured at: 21 cm Tube secured with: Tape Dental Injury: Teeth and Oropharynx as per pre-operative assessment

## 2016-08-25 NOTE — Brief Op Note (Signed)
08/22/2016 - 08/25/2016  10:36 AM  PATIENT:  [name] [name]  76 y.o. male  PRE-OPERATIVE DIAGNOSIS:  Unstable pelvic ring with ruptured symphysis and left iliac wing fracture with widened SI joint.  POST-OPERATIVE DIAGNOSIS:  Unstable pelvic ring with ruptured symphysis and left iliac wing fracture with widened SI joint.  PROCEDURE:  Procedure(s): 1. OPEN REDUCTION INTERNAL FIXATION (ORIF) PUBIC SYMPHYSIS DISRUPTION 2. LEFT SI SCREW FIXATION  SURGEON:  Surgeon(s) and Role:    * Altamese Maiden, MD - Primary  ASSISTANTS: Ainsley Spinner, Charley Miske JEFFERY   ANESTHESIA:   general  EBL:  Total I/O In: -  Out: 350 [Urine:100; Blood:250]  BLOOD ADMINISTERED:none  DRAINS: none   LOCAL MEDICATIONS USED:  NONE  SPECIMEN:  No Specimen  DISPOSITION OF SPECIMEN:  [name]  COUNTS:  YES  TOURNIQUET:  * No tourniquets in log *  DICTATION: .Other Dictation: Dictation Number [id number]  PLAN OF CARE: Admit to inpatient   PATIENT DISPOSITION:  PACU - hemodynamically stable.   Delay start of Pharmacological VTE agent (>24hrs) due to surgical blood loss or risk of bleeding: no

## 2016-08-25 NOTE — Transfer of Care (Signed)
Immediate Anesthesia Transfer of Care Note  Patient: [name] [name]  Procedure(s) Performed: Procedure(s): OPEN REDUCTION INTERNAL FIXATION (ORIF) PELVIC FRACTURE; plate on front, SI screw on the back (Left)  Patient Location: PACU  Anesthesia Type:General  Level of Consciousness: awake, alert , oriented and patient cooperative  Airway & Oxygen Therapy: Patient Spontanous Breathing and Patient connected to nasal cannula oxygen  Post-op Assessment: Report given to RN and Post -op Vital signs reviewed and stable  Post vital signs: Reviewed and stable  Last Vitals:  Vitals:   08/25/16 0701 08/25/16 1038  BP: (!) 175/59   Pulse:    Resp: (!) 26   Temp:  36.4 C    Last Pain:  Vitals:   08/25/16 1039  TempSrc:   PainSc: (P) 0-No pain      Patients Stated Pain Goal: 0 (23/76/28 3151)  Complications: No apparent anesthesia complications

## 2016-08-25 NOTE — Progress Notes (Signed)
Attempted to do post op Xrays-pt uncooperative, trying to hit [employer]. Only able to complete 1 view. Dr Marcelino Scot will be updated by [employer].

## 2016-08-25 NOTE — Op Note (Signed)
[name] [name], [name] [name] NO.:  [id number]  MEDICAL RECORD NO.:  [id number]  LOCATION:  3M04C                        FACILITY:  [facility]  PHYSICIAN:  Astrid Divine. Marcelino Scot, M.D. DATE OF BIRTH:  January 12, 1941  DATE OF PROCEDURE:  08/25/2016 DATE OF DISCHARGE:                              OPERATIVE REPORT   PREOPERATIVE DIAGNOSES:  Unstable pelvic ring with ruptured pelvic symphysis and widened left SI joint/iliac fracture.  POSTOPERATIVE DIAGNOSES:  Unstable pelvic ring with ruptured pelvic symphysis and widened left SI joint/iliac fracture.  PROCEDURES: 1. Open reduction and internal fixation of pubic symphysis. 2. Sacroiliac screw fixation, left SI joint.  SURGEON:  Astrid Divine. Marcelino Scot, M.D..  ASSISTANT: 1. Ainsley Spinner, PA-C. 2. Hilbert Odor, PA-C.  ANESTHESIA:  General.  COMPLICATIONS:  None.  ESTIMATED BLOOD LOSS:  250 mL.  DISPOSITION:  To PACU.  CONDITION:  Stable.  BRIEF SUMMARY AND INDICATIONS FOR PROCEDURE:  [name] is a very pleasant 76 year old male, who fell off a ladder about 10 feet sustaining an unstable pelvic ring injury.  He did have hypotension, required a pelvic binder, and FFP and transfusion for stabilization, but then subsequently was able to have the pelvic binder loosened and surgical reconstruction is scheduled.  Because of trauma cases, we were delayed by about 24 hours and that led to the trial of loosening the binder.  As a result of the binder, he did develop some soft tissue blistering particularly over the right hemipelvis.  I discussed with the patient and his family the risks and benefits of surgical repair including the possibility of urologic injury, nerve injury, including footdrop, DVT, PE, infection, symptomatic hardware, malunion, nonunion, death, heart attack, and others.  The patient and his family strongly wished to proceed with repair.  DESCRIPTION OF PROCEDURE:  The patient was taken to the operating room and  general anesthesia was induced.  He was positioned supine on a radiolucent table.  His legs were brought together and secured in an opposed position to facilitate reduction of the anterior pelvic ring.  A standard prep and drape was then performed and then a Pfannenstiel incision of about 5 cm dissection was carried carefully and directly down to the pelvic brim.  The rectus was released along its insertion onto the brim and this was exposed to completely disrupted and unstable pubic symphysis.  The area was cleared with suction and irrigation and then a reduction performed with a sharp tenaculum placed anteriorly.  I was then able to use a 6-hole symphyseal plate, placed 2 screws eccentrically within the plate and tightened these down in order to firmly appose the symphyseal area anteriorly and also to reduce the cephalad caudal displacement.  Once this was achieved, additional screws were placed in the plate such that had 6 cortices of purchase on either side of the symphysis.  Ainsley Spinner PA-C and Silvestre Gunner assisted during this procedure with protection of the bladder, neurologic structures posteriorly with a malleable and then anterior retraction.  Attention was then turned to the sacroiliac area where a perfect lateral was obtained of S1.  The threaded guidewire advanced at the appropriate trajectory to the SI joint on the left and then across  it into the S1 vertebral body.  Inlet and outlet views showed that this was cephalad from the S1 foramina and directly within the vertebral body on the inlet avoiding the sacral ala and the L5 nerve root anteriorly.  This was measured at 95 mm screw and washer was placed.  My assistants then performed compression of the pelvis manually during insertion of the screw and terminal tightening.  All wounds were irrigated thoroughly and a standard layered closure performed using #1 Vicryl for the rectus, 0 Vicryl for the deep tissues, 2-0 and  3-0 nylon for the SI incision. Again, the assistants were present helping throughout.  The patient will be bed-to-chair transfers only on the left.  Otherwise, will be nonweightbearing on the left using a walker.  His anticoagulation can be restarted and we will continue to follow his platelets, hemoglobin, and hematocrit, given his multiple antiplatelet agents on a chronic basis.     Astrid Divine. Marcelino Scot, M.D.     MHH/MEDQ  D:  08/25/2016  T:  08/25/2016  Job:  [id number]

## 2016-08-26 ENCOUNTER — Encounter (HOSPITAL_COMMUNITY): Payer: Self-pay | Admitting: Orthopedic Surgery

## 2016-08-26 LAB — TYPE AND SCREEN
ABO/RH(D): O POS
ANTIBODY SCREEN: NEGATIVE
UNIT DIVISION: 0
UNIT DIVISION: 0
Unit division: 0
Unit division: 0

## 2016-08-26 LAB — CBC
HCT: 24.1 % — ABNORMAL LOW (ref 39.0–52.0)
HEMOGLOBIN: 8.3 g/dL — AB (ref 13.0–17.0)
MCH: 30 pg (ref 26.0–34.0)
MCHC: 34.4 g/dL (ref 30.0–36.0)
MCV: 87 fL (ref 78.0–100.0)
Platelets: 104 10*3/uL — ABNORMAL LOW (ref 150–400)
RBC: 2.77 MIL/uL — AB (ref 4.22–5.81)
RDW: 14.8 % (ref 11.5–15.5)
WBC: 7.3 10*3/uL (ref 4.0–10.5)

## 2016-08-26 LAB — BPAM RBC
BLOOD PRODUCT EXPIRATION DATE: 201803282359
BLOOD PRODUCT EXPIRATION DATE: 201803302359
Blood Product Expiration Date: 201803232359
Blood Product Expiration Date: 201803262359
ISSUE DATE / TIME: 201803051506
ISSUE DATE / TIME: 201803051506
ISSUE DATE / TIME: 201803051619
ISSUE DATE / TIME: 201803051619
UNIT TYPE AND RH: 9500
Unit Type and Rh: 5100
Unit Type and Rh: 5100
Unit Type and Rh: 9500

## 2016-08-26 MED ORDER — ALBUMIN HUMAN 5 % IV SOLN
25.0000 g | Freq: Once | INTRAVENOUS | Status: AC
  Administered 2016-08-26: 25 g via INTRAVENOUS
  Filled 2016-08-26: qty 500

## 2016-08-26 MED ORDER — SODIUM CHLORIDE 0.9 % IV SOLN
500.0000 mL | Freq: Once | INTRAVENOUS | Status: AC
  Administered 2016-08-26: 500 mL via INTRAVENOUS

## 2016-08-26 NOTE — Progress Notes (Signed)
Trauma Service Note  Subjective: Patient is awake and alert, slept a lot yesterday.  No acute distress.  Urine output marginal last night  Objective: Vital signs in last 24 hours: Temp:  [97 F (36.1 C)-98.9 F (37.2 C)] 97.3 F (36.3 C) (03/09 0400) Pulse Rate:  [57-110] 81 (03/09 0710) Resp:  [0-26] 21 (03/09 0710) BP: (83-146)/(39-88) 123/45 (03/09 0710) SpO2:  [87 %-100 %] 90 % (03/09 0710) Last BM Date:  (PTA)  Intake/Output from previous day: 03/08 0701 - 03/09 0700 In: 3050 [I.V.:2950; IV Piggyback:100] Out: 1820 [Urine:1570; Blood:250] Intake/Output this shift: Total I/O In: 200 [IV Piggyback:200] Out: -   General: No acute distress.  Has not been out of bed  Lungs: clear to auscultation  Abd: soft, distended, has not had a bowel movement.  Good bowel sounds.  Extremities: No clinical signs or symptoms of DVT  Neuro: Intact  Lab Results: CBC   Recent Labs  08/25/16 0252 08/26/16 0355  WBC 6.5 7.3  HGB 11.3* 8.3*  HCT 32.3* 24.1*  PLT 94* 104*   BMET  Recent Labs  08/24/16 0451 08/25/16 0252  NA 138 138  K 3.4* 3.3*  CL 108 109  CO2 22 21*  GLUCOSE 107* 105*  BUN 16 10  CREATININE 0.73 0.62  CALCIUM 8.6* 8.2*   PT/INR No results for input(s): LABPROT, INR in the last 72 hours. ABG No results for input(s): PHART, HCO3 in the last 72 hours.  Invalid input(s): PCO2, PO2  Studies/Results: Dg Pelvis Portable  Result Date: 08/25/2016 CLINICAL DATA:  Status post open reduction internal fixation of pelvic ring fracture. EXAM: PORTABLE PELVIS 1-2 VIEWS COMPARISON:  Radiographs of same day. FINDINGS: Status post surgical fixation of pubic symphysis. Also noted is surgical screw placement involving the left sacroiliac joint. No other fracture or dislocation is noted. Hip joints appear normal. IMPRESSION: Status post surgical internal fixation of pubic symphysis and left sacroiliac joint. Electronically Signed   By: Marijo Conception, M.D.   On:  08/25/2016 11:45   Dg Pelvis Comp Min 3v  Result Date: 08/25/2016 CLINICAL DATA:  ORIF pelvic fracture EXAM: JUDET PELVIS - 3+ VIEW; DG C-ARM 61-120 MIN COMPARISON:  None. FLUOROSCOPY TIME:  53 seconds FINDINGS: Six intraoperative fluoroscopic spot images are provided. Interval plate and screw ORIF of symphyseal diastases. Interval placement of a cannulated screw transfixing the left sacroiliac joint. IMPRESSION: 1. Interval ORIF of the pubic symphysis and left SI joint. Electronically Signed   By: Kathreen Devoid   On: 08/25/2016 10:04   Dg C-arm 1-60 Min  Result Date: 08/25/2016 CLINICAL DATA:  ORIF pelvic fracture EXAM: JUDET PELVIS - 3+ VIEW; DG C-ARM 61-120 MIN COMPARISON:  None. FLUOROSCOPY TIME:  53 seconds FINDINGS: Six intraoperative fluoroscopic spot images are provided. Interval plate and screw ORIF of symphyseal diastases. Interval placement of a cannulated screw transfixing the left sacroiliac joint. IMPRESSION: 1. Interval ORIF of the pubic symphysis and left SI joint. Electronically Signed   By: Kathreen Devoid   On: 08/25/2016 10:04    Anti-infectives: Anti-infectives    Start     Dose/Rate Route Frequency Ordered Stop   08/25/16 1600  ceFAZolin (ANCEF) IVPB 2g/100 mL premix     2 g 200 mL/hr over 30 Minutes Intravenous Every 8 hours 08/25/16 1155 08/26/16 1559   08/24/16 1045  ceFAZolin (ANCEF) IVPB 2g/100 mL premix     2 g 200 mL/hr over 30 Minutes Intravenous To Surgery 08/24/16 1042 08/25/16 0830  Assessment/Plan: s/p Procedure(s): OPEN REDUCTION INTERNAL FIXATION (ORIF) PELVIC FRACTURE; plate on front, SI screw on the back Still a bit volume depleted.  Hemoglobin dropped 3 gm from yesterday. Givein albumin and saline boluses.  May need blood over the weekend.  LOS: 4 days   Kathryne Eriksson. Dahlia Bailiff, MD, FACS [phone] Trauma Surgeon 08/26/2016

## 2016-08-26 NOTE — Evaluation (Signed)
Physical Therapy Evaluation Patient Details Name: [name] [name] MRN: [id number] DOB: 08-23-1940 Today's Date: 08/26/2016   History of Present Illness  Patient is a 76 y/o male with hx of PVD, MI, HTN, HLD, CAD, COPD, CKD, A-fib presents after falling 8-10 feet from a ladder onto back, resulting in Symphysis diastasis and left SI widening.  Found to have Left pelvic extraperitoneal hemorrhage with a large hematoma, s/p ORIF pubic symphysis disruption and left SI screw fixation.  Clinical Impression  Patient presents with pain, anxiety, generalized weakness/deconditioning, new WB status LLE, difficulty sequencing, impaired attention, DOE and impaired mobility s/p above. Tolerated SPT with Mod assist of 2 with continuous cues to maintain NWB status however pt not able to keep weight off LLE. Requires repetition of commands and easily distracted by anticipatory pain. 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 Would benefit from CIR to maximize independence and mobility prior to return home. Will follow acutely.    Follow Up Recommendations CIR    Equipment Recommendations  Wheelchair (measurements PT);Wheelchair cushion (measurements PT)    Recommendations for Other Services Rehab consult;OT consult     Precautions / Restrictions Precautions Precautions: Fall Restrictions Weight Bearing Restrictions: Yes RLE Weight Bearing: Weight bearing as tolerated LLE Weight Bearing: Non weight bearing Other Position/Activity Restrictions: bed to chair only for 8 weeks; WBAT RLE for transfers only      Mobility  Bed Mobility Overal bed mobility: Needs Assistance Bed Mobility: Supine to Sit     Supine to sit: Mod assist;+2 for physical assistance;HOB elevated     General bed mobility comments: Step by step cues for sequencing, assist to bring LEs to EOB and to elevate trunk, use of chuck pad to scoot hips.   Transfers Overall transfer level: Needs assistance Equipment used: Rolling walker (2  wheeled) Transfers: Sit to/from [facility] Sit to Stand: Mod assist;+2 physical assistance;From elevated surface Stand pivot transfers: Mod assist;+2 physical assistance       General transfer comment: Assist of 2 to stand from EOB with step by step cues for hand placement and technique. Cues for upright posture. Pt placing weight through LLE despite cues. SPT to chair with assist managing RW and Min A for balance, nonc ompliant with WB status LLE.  Ambulation/Gait                Stairs            Wheelchair Mobility    Modified Rankin (Stroke Patients Only)       Balance Overall balance assessment: Needs assistance Sitting-balance support: Feet supported;Bilateral upper extremity supported Sitting balance-Leahy Scale: Fair Sitting balance - Comments: Able to sit unsupported with BUE support due to pain. + dizziness initially- resolved.    Standing balance support: During functional activity;Bilateral upper extremity supported Standing balance-Leahy Scale: Poor Standing balance comment: Reliant on BUEs for support on RW and to maintain WB status as well as min A for balance.                             Pertinent Vitals/Pain Pain Assessment: Faces Faces Pain Scale: Hurts whole lot Pain Location: pelvis; LLE Pain Descriptors / Indicators: Sore;Operative site guarding;Grimacing;Guarding Pain Intervention(s): Monitored during session;Repositioned;Limited activity within patient's tolerance    Home Living Family/patient expects to be discharged to:: Private residence Living Arrangements: Spouse/significant other Available Help at Discharge: Family;Available 24 hours/day Type of Home: House Home Access: Stairs to enter Entrance Stairs-Rails:  Right Entrance Stairs-Number of Steps: 2 through one way and 6 through other; plan to put ramp where the 2 steps are Home Layout: One level Home Equipment: Walker - 2 wheels;Bedside  commode;Transport chair      Prior Function Level of Independence: Independent         Comments: Drives, cooks, cleans.      Hand Dominance   Dominant Hand: Right    Extremity/Trunk Assessment   Upper Extremity Assessment Upper Extremity Assessment: Defer to OT evaluation    Lower Extremity Assessment Lower Extremity Assessment: LLE deficits/detail;RLE deficits/detail RLE Deficits / Details: Able to perform LAQ. knee instability noted during standing. LLE Deficits / Details: Able to perform LAQ despite pain.       Communication   Communication: No difficulties  Cognition Arousal/Alertness: Awake/alert Behavior During Therapy: Anxious Overall Cognitive Status: Within Functional Limits for tasks assessed Area of Impairment: Problem solving;Following commands;Attention   Current Attention Level: Selective   Following Commands: Follows multi-step commands inconsistently;Follows multi-step commands with increased time     Problem Solving: Requires verbal cues;Requires tactile cues;Difficulty sequencing General Comments: Anxious with mobility and requires constant redirection to task. Has to be told WB status multiple times and step by step cues prior to performing mobility    General Comments General comments (skin integrity, edema, etc.): Wife present during session. BP soft but stable. Sp02 dropped to mid 80s on RA, placed 02 and Able to maintain >90%, Difficulty getting accurate reading due to poor wave form.    Exercises General Exercises - Lower Extremity Ankle Circles/Pumps: Both;10 reps;Supine Quad Sets: Both;10 reps;Supine   Assessment/Plan    PT Assessment Patient needs continued PT services  PT Problem List Decreased strength;Decreased mobility;Decreased knowledge of precautions;Decreased activity tolerance;Decreased balance;Pain;Decreased knowledge of use of DME;Cardiopulmonary status limiting activity;Decreased cognition       PT Treatment  Interventions Therapeutic activities;DME instruction;Therapeutic exercise;Patient/family education;Balance training;Wheelchair mobility training;Functional mobility training    PT Goals (Current goals can be found in the Care Plan section)  Acute Rehab PT Goals Patient Stated Goal: to get better PT Goal Formulation: With patient/family Time For Goal Achievement: 09/09/16 Potential to Achieve Goals: Good    Frequency Min 4X/week   Barriers to discharge        Co-evaluation               End of Session Equipment Utilized During Treatment: Gait belt;Oxygen Activity Tolerance: Patient limited by pain;Patient tolerated treatment well Patient left: in chair;with call bell/phone within reach;with family/visitor present;with chair alarm set Nurse Communication: Mobility status PT Visit Diagnosis: Unsteadiness on feet (R26.81);Muscle weakness (generalized) (M62.81);Difficulty in walking, not elsewhere classified (R26.2)         Time: 6389-3734 PT Time Calculation (min) (ACUTE ONLY): 36 min   Charges:   PT Evaluation $PT Eval Moderate Complexity: 1 Procedure PT Treatments $Therapeutic Activity: 8-22 mins   PT G Codes:         Sophea Rackham A Amar Keenum 08/26/2016, 3:50 PM Wray Kearns, Gary, DPT [phone]

## 2016-08-26 NOTE — Progress Notes (Signed)
Rehab Admissions Coordinator Note:  Patient was screened by Cleatrice Burke for appropriateness for an Inpatient Acute Rehab Consult per PT recommendation.  At this time, we are recommending Inpatient Rehab consult when you feel appropriate, please place order.   Cleatrice Burke 08/26/2016, 3:53 PM  I can be reached at [phone].

## 2016-08-26 NOTE — Addendum Note (Signed)
Addended by: Lianne Cure A on: 08/26/2016 08:59 AM   Modules accepted: Orders

## 2016-08-26 NOTE — Progress Notes (Signed)
Orthopaedic Trauma Service (OTS)  Subjective: 1 Day Post-Op Procedure(s) (LRB): OPEN REDUCTION INTERNAL FIXATION (ORIF) PELVIC FRACTURE; plate on front, SI screw on the back (Left) Patient reports pain as mild.   Mobilized to a chair without much difficulty.  Objective: Current Vitals Blood pressure (!) 114/40, pulse 84, temperature 97.7 F (36.5 C), temperature source Oral, resp. rate (!) 27, height '5\' 6"'$  (1.676 m), weight 71.7 kg (158 lb), SpO2 95 %. Vital signs in last 24 hours: Temp:  [97.3 F (36.3 C)-98.1 F (36.7 C)] 97.7 F (36.5 C) (03/09 1200) Pulse Rate:  [60-97] 84 (03/09 1400) Resp:  [11-27] 27 (03/09 1400) BP: (86-146)/(39-63) 114/40 (03/09 1400) SpO2:  [81 %-100 %] 95 % (03/09 1400)  Intake/Output from previous day: 03/08 0701 - 03/09 0700 In: 3050 [I.V.:2950; IV Piggyback:100] Out: 1820 [Urine:1570; Blood:250]  LABS  Recent Labs  08/24/16 0451 08/25/16 0252 08/26/16 0355  HGB 11.6* 11.3* 8.3*    Recent Labs  08/25/16 0252 08/26/16 0355  WBC 6.5 7.3  RBC 3.77* 2.77*  HCT 32.3* 24.1*  PLT 94* 104*    Recent Labs  08/24/16 0451 08/25/16 0252  NA 138 138  K 3.4* 3.3*  CL 108 109  CO2 22 21*  BUN 16 10  CREATININE 0.73 0.62  GLUCOSE 107* 105*  CALCIUM 8.6* 8.2*   No results for input(s): LABPT, INR in the last 72 hours.  Physical Exam Abd drsg clean, dry, intact Swelling well controlled in lower extremities.   Assessment/Plan: 1 Day Post-Op Procedure(s) (LRB): OPEN REDUCTION INTERNAL FIXATION (ORIF) PELVIC FRACTURE; plate on front, SI screw on the back (Left)  1. Continue PT/OT with WBAT on LLE for transfers only; o/w NWB; no ROM precautions 2. Ok to restart antiplatelet therapy when deemed appropriate by primary service 3. Will check on next week; if d/c'd early will see back in office in 2 wks  Altamese Brilliant, MD [facility] [phone] (p)  08/26/2016, 4:06 PM

## 2016-08-27 LAB — PREPARE RBC (CROSSMATCH)

## 2016-08-27 LAB — CBC WITH DIFFERENTIAL/PLATELET
BASOS PCT: 0 %
BASOS PCT: 0 %
Basophils Absolute: 0 10*3/uL (ref 0.0–0.1)
Basophils Absolute: 0 10*3/uL (ref 0.0–0.1)
EOS ABS: 0.1 10*3/uL (ref 0.0–0.7)
Eosinophils Absolute: 0.1 10*3/uL (ref 0.0–0.7)
Eosinophils Relative: 2 %
Eosinophils Relative: 2 %
HEMATOCRIT: 19.9 % — AB (ref 39.0–52.0)
HEMATOCRIT: 25.8 % — AB (ref 39.0–52.0)
HEMOGLOBIN: 6.9 g/dL — AB (ref 13.0–17.0)
HEMOGLOBIN: 8.8 g/dL — AB (ref 13.0–17.0)
LYMPHS PCT: 14 %
Lymphocytes Relative: 13 %
Lymphs Abs: 1.1 10*3/uL (ref 0.7–4.0)
Lymphs Abs: 1 10*3/uL (ref 0.7–4.0)
MCH: 30.3 pg (ref 26.0–34.0)
MCH: 29.9 pg (ref 26.0–34.0)
MCHC: 34.7 g/dL (ref 30.0–36.0)
MCHC: 34.1 g/dL (ref 30.0–36.0)
MCV: 87.3 fL (ref 78.0–100.0)
MCV: 87.8 fL (ref 78.0–100.0)
MONO ABS: 0.4 10*3/uL (ref 0.1–1.0)
MONOS PCT: 5 %
Monocytes Absolute: 0.5 10*3/uL (ref 0.1–1.0)
Monocytes Relative: 7 %
NEUTROS ABS: 6.1 10*3/uL (ref 1.7–7.7)
NEUTROS ABS: 6.1 10*3/uL (ref 1.7–7.7)
NEUTROS PCT: 78 %
NEUTROS PCT: 78 %
Platelets: 103 10*3/uL — ABNORMAL LOW (ref 150–400)
Platelets: 107 10*3/uL — ABNORMAL LOW (ref 150–400)
RBC: 2.28 MIL/uL — ABNORMAL LOW (ref 4.22–5.81)
RBC: 2.94 MIL/uL — AB (ref 4.22–5.81)
RDW: 15.1 % (ref 11.5–15.5)
RDW: 14.7 % (ref 11.5–15.5)
WBC: 7.8 10*3/uL (ref 4.0–10.5)
WBC: 7.8 10*3/uL (ref 4.0–10.5)

## 2016-08-27 LAB — BASIC METABOLIC PANEL
ANION GAP: 8 (ref 5–15)
BUN: 13 mg/dL (ref 6–20)
CALCIUM: 7.8 mg/dL — AB (ref 8.9–10.3)
CHLORIDE: 105 mmol/L (ref 101–111)
CO2: 22 mmol/L (ref 22–32)
Creatinine, Ser: 0.64 mg/dL (ref 0.61–1.24)
GFR calc non Af Amer: >60 mL/min (ref >60)
GLUCOSE: 120 mg/dL — AB (ref 65–99)
POTASSIUM: 3.2 mmol/L — AB (ref 3.5–5.1)
Sodium: 135 mmol/L (ref 135–145)

## 2016-08-27 MED ORDER — SODIUM CHLORIDE 0.9 % IV SOLN: Freq: Once | INTRAVENOUS | Status: DC

## 2016-08-27 NOTE — Progress Notes (Signed)
CRITICAL VALUE ALERT  Critical value received:  Hgb 6.9  Date of notification:  08/27/2016  Time of notification:  0500  Critical value read back:Yes.    Nurse who received alert:  [place] Lynnzie Blackson, RN  MD notified (1st page):  Dr Barry Dienes  Time of first page:  [phone]  MD notified (2nd page):  Time of second page:  Responding MD:  Barry Dienes  Time MD responded:  Roseann.Parish- order to give 1U PRBC

## 2016-08-27 NOTE — Progress Notes (Signed)
Physical Therapy Treatment Patient Details Name: [name] [name] MRN: [id number] DOB: 12-18-1940 Today's Date: 08/27/2016    History of Present Illness Patient is a 76 y/o male with hx of PVD, MI, HTN, HLD, CAD, COPD, CKD, A-fib presents after falling 8-10 feet from a ladder onto back, resulting in Symphysis diastasis and left SI widening.  Found to have Left pelvic extraperitoneal hemorrhage with a large hematoma, s/p ORIF pubic symphysis disruption and left SI screw fixation.    PT Comments    Patient seen for OOB progression with transfer training. Patient EOB, static sitting without assist, but continues to require assist for dynamic activities and 2 person assist for OOB to chair transfers. Educate patient on LE ROM for improved circulation. Patient appears receptive. Some perseveration and increased anxiety noted during session. Will continue to see and progress as tolerated.   Follow Up Recommendations  CIR     Equipment Recommendations  Wheelchair (measurements PT);Wheelchair cushion (measurements PT)    Recommendations for Other Services Rehab consult;OT consult     Precautions / Restrictions Precautions Precautions: Fall Restrictions Weight Bearing Restrictions: Yes RLE Weight Bearing: Weight bearing as tolerated LLE Weight Bearing: Non weight bearing Other Position/Activity Restrictions: bed to chair only for 8 weeks; WBAT RLE for transfers only    Mobility  Bed Mobility Overal bed mobility: Needs Assistance Bed Mobility: Supine to Sit     Supine to sit: Mod assist;+2 for physical assistance     General bed mobility comments: Assist for LEs and trunk elevation with use of bed pad to scoot hips.  Transfers Overall transfer level: Needs assistance Equipment used: 2 person hand held assist Transfers: Sit to/from [facility] Sit to Stand: Mod assist;+2 physical assistance Stand pivot transfers: Mod assist;+2 physical assistance       General  transfer comment: Assist to boost up from EOB and for stand pivot to chair. Assist provided to keep LLE NWB.  Ambulation/Gait                 Stairs            Wheelchair Mobility    Modified Rankin (Stroke Patients Only)       Balance Overall balance assessment: Needs assistance Sitting-balance support: Feet supported;Bilateral upper extremity supported Sitting balance-Leahy Scale: Fair Sitting balance - Comments: able to tolerate EOB activity with out assist but some instability noted with increased distractions   Standing balance support: Bilateral upper extremity supported Standing balance-Leahy Scale: Poor                      Cognition Arousal/Alertness: Awake/alert Behavior During Therapy: Restless;Anxious Overall Cognitive Status: Impaired/Different from baseline Area of Impairment: Attention;Safety/judgement;Awareness;Problem solving   Current Attention Level: Selective   Following Commands: Follows one step commands inconsistently Safety/Judgement: Decreased awareness of safety;Decreased awareness of deficits Awareness: Emergent   General Comments: Daughter reports pt close to baseline cognitively. Repeating himself throghout session and focused on WB status.  perseverative and some difficulty with processing of activity during session    Exercises      General Comments        Pertinent Vitals/Pain Pain Assessment: Faces Faces Pain Scale: Hurts even more Pain Location: LLE, hips Pain Descriptors / Indicators: Aching;Sore;Grimacing;Guarding Pain Intervention(s): Monitored during session;Limited activity within patient's tolerance;Premedicated before session;Repositioned    Home Living Family/patient expects to be discharged to:: Inpatient rehab  Prior Function Level of Independence: Independent      Comments: Drives, cooks, cleans.    PT Goals (current goals can now be found in the care plan section)  Acute Rehab PT Goals Patient Stated Goal: to get better PT Goal Formulation: With patient/family Time For Goal Achievement: 09/09/16 Potential to Achieve Goals: Good Progress towards PT goals: Progressing toward goals    Frequency    Min 4X/week      PT Plan Current plan remains appropriate    Co-evaluation PT/OT/SLP Co-Evaluation/Treatment: Yes Reason for Co-Treatment: For patient/therapist safety;To address functional/ADL transfers PT goals addressed during session: Mobility/safety with mobility OT goals addressed during session: ADL's and self-care     End of Session Equipment Utilized During Treatment: Gait belt;Oxygen Activity Tolerance: Patient limited by pain;Patient tolerated treatment well Patient left: in chair;with call bell/phone within reach;with family/visitor present;with chair alarm set Nurse Communication: Mobility status PT Visit Diagnosis: Unsteadiness on feet (R26.81);Muscle weakness (generalized) (M62.81);Difficulty in walking, not elsewhere classified (R26.2)     Time: 7207-2182 PT Time Calculation (min) (ACUTE ONLY): 24 min  Charges:  $Therapeutic Activity: 8-22 mins                    G Codes:       Duncan Dull 2016/09/14, 5:01 PM Alben Deeds, [place] DPT  [phone]

## 2016-08-27 NOTE — Progress Notes (Signed)
Subjective: 2 Days Post-Op Procedure(s) (LRB): OPEN REDUCTION INTERNAL FIXATION (ORIF) PELVIC FRACTURE; plate on front, SI screw on the back (Left) Patient reports pain as mild and moderate.   ABLA planned for transfusion today.  Objective: Vital signs in last 24 hours: Temp:  [97.6 F (36.4 C)-98.4 F (36.9 C)] 98.2 F (36.8 C) (03/10 0352) Pulse Rate:  [65-95] 68 (03/10 0700) Resp:  [15-33] 16 (03/10 0700) BP: (97-138)/(40-106) 119/50 (03/10 0700) SpO2:  [81 %-100 %] 99 % (03/10 0700)  Intake/Output from previous day: 03/09 0701 - 03/10 0700 In: 3390 [P.O.:190; I.V.:2500; IV Piggyback:700] Out: 885 [Urine:885] Intake/Output this shift: No intake/output data recorded.   Recent Labs  08/25/16 0252 08/26/16 0355 08/27/16 0402  HGB 11.3* 8.3* 6.9*    Recent Labs  08/26/16 0355 08/27/16 0402  WBC 7.3 7.8  RBC 2.77* 2.28*  HCT 24.1* 19.9*  PLT 104* 103*    Recent Labs  08/25/16 0252 08/27/16 0402  NA 138 135  K 3.3* 3.2*  CL 109 105  CO2 21* 22  BUN 10 13  CREATININE 0.62 0.64  GLUCOSE 105* 120*  CALCIUM 8.2* 7.8*   No results for input(s): LABPT, INR in the last 72 hours.  Sensation intact distally Incision: scant drainage  Assessment/Plan: 2 Days Post-Op Procedure(s) (LRB): OPEN REDUCTION INTERNAL FIXATION (ORIF) PELVIC FRACTURE; plate on front, SI screw on the back (Left)  NWB LLE, no ROM precautions, RLE WBAT for transfers only Pain med per primary team Will continue to follow   Chriss Czar 08/27/2016, 7:36 AM

## 2016-08-27 NOTE — Evaluation (Signed)
Occupational Therapy Evaluation Patient Details Name: [name] [name] MRN: [id number] DOB: [birth year] Today's Date: 08/27/2016    History of Present Illness Patient is a 76 y/o male with hx of PVD, MI, HTN, HLD, CAD, COPD, CKD, A-fib presents after falling 8-10 feet from a ladder onto back, resulting in Symphysis diastasis and left SI widening.  Found to have Left pelvic extraperitoneal hemorrhage with a large hematoma, s/p ORIF pubic symphysis disruption and left SI screw fixation.   Clinical Impression   Pt reports he was independent with ADL PTA. Currently pt mod assist +2 for stand pivot transfer with max cues and physical assist to maintain LLE NWB. Pt requires min assist for seated UB ADL and max assist for LB ADL. Pt presenting with poor awareness of WB status, pain, LE weakness impacting his independence and safety with ADL and functional mobility. Recommending CIR level therapies to maximize independence and safety with ADL and functional mobility prior to return home. Pt would benefit from continued skilled OT to address established goals.    Follow Up Recommendations  CIR;Supervision/Assistance - 24 hour    Equipment Recommendations  Other (comment) (TBD at next venue)    Recommendations for Other Services       Precautions / Restrictions Precautions Precautions: Fall Restrictions Weight Bearing Restrictions: Yes RLE Weight Bearing: Weight bearing as tolerated LLE Weight Bearing: Non weight bearing Other Position/Activity Restrictions: bed to chair only for 8 weeks; WBAT RLE for transfers only      Mobility Bed Mobility Overal bed mobility: Needs Assistance Bed Mobility: Supine to Sit     Supine to sit: Mod assist;+2 for physical assistance     General bed mobility comments: Assist for LEs and trunk elevation with use of bed pad to scoot hips.  Transfers Overall transfer level: Needs assistance Equipment used: 2 person hand held assist Transfers: Sit to/from  [facility] Sit to Stand: Mod assist;+2 physical assistance Stand pivot transfers: Mod assist;+2 physical assistance       General transfer comment: Assist to boost up from EOB and for stand pivot to chair. Assist provided to keep LLE NWB.    Balance Overall balance assessment: Needs assistance Sitting-balance support: Feet supported;Bilateral upper extremity supported Sitting balance-Leahy Scale: Fair     Standing balance support: Bilateral upper extremity supported Standing balance-Leahy Scale: Poor                              ADL Overall ADL's : Needs assistance/impaired     Grooming: Min guard;Sitting   Upper Body Bathing: Minimal assistance;Sitting   Lower Body Bathing: Maximal assistance;Sitting/lateral leans   Upper Body Dressing : Minimal assistance;Sitting   Lower Body Dressing: Maximal assistance;Sitting/lateral leans   Toilet Transfer: Moderate assistance;+2 for physical assistance;Stand-pivot;BSC Toilet Transfer Details (indicate cue type and reason): Simulated by stand pivot from EOB to chair         Functional mobility during ADLs: Moderate assistance;+2 for physical assistance General ADL Comments: Needs cues and physical assist for NWB on LLE.     Vision         Perception     Praxis      Pertinent Vitals/Pain Pain Assessment: Faces Faces Pain Scale: Hurts even more Pain Location: LLE, hips Pain Descriptors / Indicators: Aching;Sore;Grimacing;Guarding Pain Intervention(s): Monitored during session;Limited activity within patient's tolerance;Premedicated before session;Repositioned     Hand Dominance Right   Extremity/Trunk Assessment Upper Extremity Assessment Upper Extremity Assessment:  Overall [facility] for tasks assessed   Lower Extremity Assessment Lower Extremity Assessment: Defer to PT evaluation   Cervical / Trunk Assessment Cervical / Trunk Assessment: Normal   Communication  Communication Communication: No difficulties   Cognition Arousal/Alertness: Awake/alert Behavior During Therapy: WFL for tasks assessed/performed;Anxious                   General Comments: Daughter reports pt close to baseline cognitively. Repeating himself throghout session and focused on WB status.   General Comments       Exercises       Shoulder Instructions      Home Living Family/patient expects to be discharged to:: Inpatient rehab                                        Prior Functioning/Environment Level of Independence: Independent        Comments: Drives, cooks, cleans.         OT Problem List: Decreased strength;Decreased activity tolerance;Impaired balance (sitting and/or standing);Decreased knowledge of use of DME or AE;Decreased knowledge of precautions;Pain      OT Treatment/Interventions: Self-care/ADL training;Energy conservation;DME and/or AE instruction;Therapeutic activities;Patient/family education;Balance training    OT Goals(Current goals can be found in the care plan section) Acute Rehab OT Goals Patient Stated Goal: to get better OT Goal Formulation: With patient/family Time For Goal Achievement: 09/10/16 Potential to Achieve Goals: Good ADL Goals Pt Will Perform Grooming: with set-up;with supervision;sitting Pt Will Perform Lower Body Bathing: with min guard assist;sitting/lateral leans Pt Will Perform Lower Body Dressing: with min guard assist;sitting/lateral leans Pt Will Transfer to Toilet: with mod assist;stand pivot transfer;bedside commode Pt Will Perform Toileting - Clothing Manipulation and hygiene: with min guard assist;sitting/lateral leans  OT Frequency: Min 2X/week   Barriers to D/C:            Co-evaluation PT/OT/SLP Co-Evaluation/Treatment: Yes Reason for Co-Treatment: For patient/therapist safety;To address functional/ADL transfers   OT goals addressed during session: ADL's and self-care       End of Session Equipment Utilized During Treatment: Gait belt;Oxygen Nurse Communication: Mobility status;Weight bearing status  Activity Tolerance: Patient tolerated treatment well Patient left: in chair;with call bell/phone within reach;with chair alarm set;with family/visitor present  OT Visit Diagnosis: Other abnormalities of gait and mobility (R26.89)                ADL either performed or assessed with clinical judgement  Time: 1431-1455 OT Time Calculation (min): 24 min Charges:  OT General Charges $OT Visit: 1 Procedure OT Evaluation $OT Eval Moderate Complexity: 1 Procedure G-Codes:     Duron Meister A. Ulice Brilliant, M.S., OTR/L Pager: [place] 08/27/2016, 4:10 PM

## 2016-08-27 NOTE — Progress Notes (Signed)
2 Days Post-Op  Subjective: Pt awake and alert   Sore lower abdomen   Objective: Vital signs in last 24 hours: Temp:  [97.7 F (36.5 C)-98.7 F (37.1 C)] 98.7 F (37.1 C) (03/10 0835) Pulse Rate:  [65-95] 81 (03/10 0835) Resp:  [15-33] 20 (03/10 0835) BP: (97-138)/(40-106) 122/57 (03/10 0835) SpO2:  [81 %-100 %] 98 % (03/10 0835) Last BM Date: 08/22/16  Intake/Output from previous day: 03/09 0701 - 03/10 0700 In: 3390 [P.O.:190; I.V.:2500; IV Piggyback:700] Out: 885 [Urine:885] Intake/Output this shift: No intake/output data recorded.  General appearance: alert and cooperative Resp: clear to auscultation bilaterally Cardio: regular rate and rhythm, S1, S2 normal, no murmur, click, rub or gallop GI: soft ND   lower abdominal dressing intact  Extremities: no significant edema   Lab Results:   Recent Labs  08/26/16 0355 08/27/16 0402  WBC 7.3 7.8  HGB 8.3* 6.9*  HCT 24.1* 19.9*  PLT 104* 103*   BMET  Recent Labs  08/25/16 0252 08/27/16 0402  NA 138 135  K 3.3* 3.2*  CL 109 105  CO2 21* 22  GLUCOSE 105* 120*  BUN 10 13  CREATININE 0.62 0.64  CALCIUM 8.2* 7.8*   PT/INR No results for input(s): LABPROT, INR in the last 72 hours. ABG No results for input(s): PHART, HCO3 in the last 72 hours.  Invalid input(s): PCO2, PO2  Studies/Results: Dg Pelvis Portable  Result Date: 08/25/2016 CLINICAL DATA:  Status post open reduction internal fixation of pelvic ring fracture. EXAM: PORTABLE PELVIS 1-2 VIEWS COMPARISON:  Radiographs of same day. FINDINGS: Status post surgical fixation of pubic symphysis. Also noted is surgical screw placement involving the left sacroiliac joint. No other fracture or dislocation is noted. Hip joints appear normal. IMPRESSION: Status post surgical internal fixation of pubic symphysis and left sacroiliac joint. Electronically Signed   By: Marijo Conception, M.D.   On: 08/25/2016 11:45   Dg Pelvis Comp Min 3v  Result Date:  08/25/2016 CLINICAL DATA:  ORIF pelvic fracture EXAM: JUDET PELVIS - 3+ VIEW; DG C-ARM 61-120 MIN COMPARISON:  None. FLUOROSCOPY TIME:  53 seconds FINDINGS: Six intraoperative fluoroscopic spot images are provided. Interval plate and screw ORIF of symphyseal diastases. Interval placement of a cannulated screw transfixing the left sacroiliac joint. IMPRESSION: 1. Interval ORIF of the pubic symphysis and left SI joint. Electronically Signed   By: Kathreen Devoid   On: 08/25/2016 10:04   Dg C-arm 1-60 Min  Result Date: 08/25/2016 CLINICAL DATA:  ORIF pelvic fracture EXAM: JUDET PELVIS - 3+ VIEW; DG C-ARM 61-120 MIN COMPARISON:  None. FLUOROSCOPY TIME:  53 seconds FINDINGS: Six intraoperative fluoroscopic spot images are provided. Interval plate and screw ORIF of symphyseal diastases. Interval placement of a cannulated screw transfixing the left sacroiliac joint. IMPRESSION: 1. Interval ORIF of the pubic symphysis and left SI joint. Electronically Signed   By: Kathreen Devoid   On: 08/25/2016 10:04    Anti-infectives: Anti-infectives    Start     Dose/Rate Route Frequency Ordered Stop   08/25/16 1600  ceFAZolin (ANCEF) IVPB 2g/100 mL premix     2 g 200 mL/hr over 30 Minutes Intravenous Every 8 hours 08/25/16 1155 08/26/16 0821   08/24/16 1045  ceFAZolin (ANCEF) IVPB 2g/100 mL premix     2 g 200 mL/hr over 30 Minutes Intravenous To Surgery 08/24/16 1042 08/25/16 0830      Assessment/Plan: Fall from ladder Open book pelvic FX with L SI disruption - S/P SI screw  and anterior plate by Dr. Marcelino Scot 3/8 ABL anemia - transfuse 1 U  Thrombocytopenia - F/U in AM FEN - adv diet RML mass/brain mass - likely lung CA with met, will need further eval as outpatient.  VTE - PAS, cannot give Lovenox with PLTs < 100k Dispo - ICU    LOS: 5 days    Olamae Ferrara A. 08/27/2016

## 2016-08-28 LAB — BASIC METABOLIC PANEL
Anion gap: 7 (ref 5–15)
BUN: 14 mg/dL (ref 6–20)
CALCIUM: 7.9 mg/dL — AB (ref 8.9–10.3)
CO2: 22 mmol/L (ref 22–32)
CREATININE: 0.64 mg/dL (ref 0.61–1.24)
Chloride: 105 mmol/L (ref 101–111)
GFR calc non Af Amer: >60 mL/min (ref >60)
Glucose, Bld: 139 mg/dL — ABNORMAL HIGH (ref 65–99)
Potassium: 3.3 mmol/L — ABNORMAL LOW (ref 3.5–5.1)
SODIUM: 134 mmol/L — AB (ref 135–145)

## 2016-08-28 LAB — TYPE AND SCREEN
ABO/RH(D): O POS
ANTIBODY SCREEN: NEGATIVE
UNIT DIVISION: 0

## 2016-08-28 LAB — CBC WITH DIFFERENTIAL/PLATELET
BASOS PCT: 0 %
Basophils Absolute: 0 10*3/uL (ref 0.0–0.1)
EOS ABS: 0.1 10*3/uL (ref 0.0–0.7)
Eosinophils Relative: 1 %
HCT: 23.7 % — ABNORMAL LOW (ref 39.0–52.0)
Hemoglobin: 8.3 g/dL — ABNORMAL LOW (ref 13.0–17.0)
Lymphocytes Relative: 10 %
Lymphs Abs: 0.8 10*3/uL (ref 0.7–4.0)
MCH: 30.6 pg (ref 26.0–34.0)
MCHC: 35 g/dL (ref 30.0–36.0)
MCV: 87.5 fL (ref 78.0–100.0)
MONO ABS: 0.5 10*3/uL (ref 0.1–1.0)
MONOS PCT: 6 %
Neutro Abs: 6.7 10*3/uL (ref 1.7–7.7)
Neutrophils Relative %: 83 %
Platelets: 112 10*3/uL — ABNORMAL LOW (ref 150–400)
RBC: 2.71 MIL/uL — ABNORMAL LOW (ref 4.22–5.81)
RDW: 14.9 % (ref 11.5–15.5)
WBC: 8 10*3/uL (ref 4.0–10.5)

## 2016-08-28 LAB — BPAM RBC
Blood Product Expiration Date: 201804032359
ISSUE DATE / TIME: 201803100842
UNIT TYPE AND RH: 5100

## 2016-08-28 MED ORDER — PANTOPRAZOLE SODIUM 40 MG PO TBEC
40.0000 mg | DELAYED_RELEASE_TABLET | Freq: Every day | ORAL | Status: DC
  Administered 2016-08-29 – 2016-08-31 (×3): 40 mg via ORAL
  Filled 2016-08-28 (×3): qty 1

## 2016-08-28 MED ORDER — SODIUM CHLORIDE 0.9 % IV SOLN
INTRAVENOUS | Status: DC
  Administered 2016-08-28: 16:00:00 via INTRAVENOUS

## 2016-08-28 MED ORDER — ONDANSETRON HCL 4 MG/2ML IJ SOLN: 4.0000 mg | Freq: Four times a day (QID) | INTRAMUSCULAR | Status: DC | PRN

## 2016-08-28 MED ORDER — HYDROMORPHONE HCL 2 MG/ML IJ SOLN: 0.5000 mg | INTRAMUSCULAR | Status: DC | PRN

## 2016-08-28 MED ORDER — ACETAMINOPHEN 325 MG PO TABS
650.0000 mg | ORAL_TABLET | Freq: Four times a day (QID) | ORAL | Status: DC | PRN
  Administered 2016-08-28: 650 mg via ORAL
  Filled 2016-08-28: qty 2

## 2016-08-28 MED ORDER — OXYCODONE HCL 5 MG PO TABS: 5.0000 mg | ORAL_TABLET | Freq: Four times a day (QID) | ORAL | Status: DC | PRN

## 2016-08-28 MED ORDER — METOPROLOL TARTRATE 50 MG PO TABS
50.0000 mg | ORAL_TABLET | Freq: Two times a day (BID) | ORAL | Status: DC
  Administered 2016-08-28 – 2016-08-31 (×7): 50 mg via ORAL
  Filled 2016-08-28 (×7): qty 1

## 2016-08-28 MED ORDER — BISACODYL 10 MG RE SUPP
10.0000 mg | Freq: Every day | RECTAL | Status: DC | PRN
  Administered 2016-08-28: 10 mg via RECTAL
  Filled 2016-08-28: qty 1

## 2016-08-28 MED ORDER — TAMSULOSIN HCL 0.4 MG PO CAPS
0.4000 mg | ORAL_CAPSULE | Freq: Every day | ORAL | Status: DC
  Administered 2016-08-28: 0.4 mg via ORAL
  Filled 2016-08-28: qty 1

## 2016-08-28 MED ORDER — SODIUM CHLORIDE 0.9 % IV SOLN
Freq: Once | INTRAVENOUS | Status: AC
  Administered 2016-08-28: 16:00:00 via INTRAVENOUS

## 2016-08-28 MED ORDER — ROSUVASTATIN CALCIUM 10 MG PO TABS
20.0000 mg | ORAL_TABLET | Freq: Every day | ORAL | Status: DC
  Administered 2016-08-28 – 2016-08-31 (×4): 20 mg via ORAL
  Filled 2016-08-28 (×4): qty 2

## 2016-08-28 MED ORDER — TAMSULOSIN HCL 0.4 MG PO CAPS
0.4000 mg | ORAL_CAPSULE | Freq: Every day | ORAL | Status: DC
  Administered 2016-08-29 – 2016-08-31 (×3): 0.4 mg via ORAL
  Filled 2016-08-28 (×3): qty 1

## 2016-08-28 MED ORDER — DOCUSATE SODIUM 100 MG PO CAPS
100.0000 mg | ORAL_CAPSULE | Freq: Two times a day (BID) | ORAL | Status: DC
  Administered 2016-08-29 – 2016-08-31 (×5): 100 mg via ORAL
  Filled 2016-08-28 (×6): qty 1

## 2016-08-28 MED ORDER — ONDANSETRON HCL 4 MG PO TABS: 4.0000 mg | ORAL_TABLET | Freq: Four times a day (QID) | ORAL | Status: DC | PRN

## 2016-08-28 MED ORDER — DOCUSATE SODIUM 100 MG PO CAPS
100.0000 mg | ORAL_CAPSULE | Freq: Two times a day (BID) | ORAL | Status: DC
  Administered 2016-08-28: 100 mg via ORAL
  Filled 2016-08-28: qty 1

## 2016-08-28 MED ORDER — OXYCODONE-ACETAMINOPHEN 5-325 MG PO TABS
1.0000 | ORAL_TABLET | Freq: Four times a day (QID) | ORAL | Status: DC | PRN
  Administered 2016-08-28 – 2016-08-30 (×4): 2 via ORAL
  Administered 2016-08-30: 1 via ORAL
  Administered 2016-08-31: 2 via ORAL
  Filled 2016-08-28: qty 2
  Filled 2016-08-28: qty 1
  Filled 2016-08-28 (×4): qty 2

## 2016-08-28 MED ORDER — TRIAMTERENE-HCTZ 37.5-25 MG PO TABS
1.0000 | ORAL_TABLET | Freq: Every day | ORAL | Status: DC
  Administered 2016-08-28 – 2016-08-31 (×4): 1 via ORAL
  Filled 2016-08-28 (×4): qty 1

## 2016-08-28 MED ORDER — LEVOTHYROXINE SODIUM 112 MCG PO TABS
112.0000 ug | ORAL_TABLET | Freq: Every day | ORAL | Status: DC
  Administered 2016-08-29 – 2016-08-31 (×3): 112 ug via ORAL
  Filled 2016-08-28 (×3): qty 1

## 2016-08-28 MED ORDER — SENNA 8.6 MG PO TABS
1.0000 | ORAL_TABLET | Freq: Every day | ORAL | Status: DC
  Administered 2016-08-28: 8.6 mg via ORAL
  Filled 2016-08-28: qty 1

## 2016-08-28 MED ORDER — NITROGLYCERIN 0.4 MG SL SUBL: 0.4000 mg | SUBLINGUAL_TABLET | SUBLINGUAL | Status: DC | PRN

## 2016-08-28 MED ORDER — SENNA 8.6 MG PO TABS
1.0000 | ORAL_TABLET | Freq: Every day | ORAL | Status: DC
  Administered 2016-08-29 – 2016-08-31 (×3): 8.6 mg via ORAL
  Filled 2016-08-28 (×2): qty 1

## 2016-08-28 MED ORDER — MAGNESIUM CITRATE PO SOLN
1.0000 | Freq: Once | ORAL | Status: AC
  Administered 2016-08-28: 1 via ORAL
  Filled 2016-08-28: qty 296

## 2016-08-28 NOTE — Progress Notes (Signed)
3 Days Post-Op  Subjective: Pt with reasonable pain control.  No BM yet.    Objective: Vital signs in last 24 hours: Temp:  [97.7 F (36.5 C)-98.5 F (36.9 C)] 98.3 F (36.8 C) (03/11 0800) Pulse Rate:  [64-91] 64 (03/11 0900) Resp:  [14-31] 15 (03/11 0900) BP: (115-147)/(50-66) 132/50 (03/11 0900) SpO2:  [89 %-99 %] 95 % (03/11 0900) Last BM Date: 08/22/16  Intake/Output from previous day: 03/10 0701 - 03/11 0700 In: 3096 [P.O.:240; I.V.:2525; Blood:331] Out: 244 [Urine:875] Intake/Output this shift: Total I/O In: 200 [I.V.:200] Out: -   General appearance: alert and cooperative Resp: clear to auscultation bilaterally Cardio: regular rate and rhythm, S1, S2 normal, no murmur, click, rub or gallop GI: soft ND   lower abdominal dressing intact  Extremities: no significant edema   Lab Results:   Recent Labs  08/27/16 0402 08/27/16 1333  WBC 7.8 7.8  HGB 6.9* 8.8*  HCT 19.9* 25.8*  PLT 103* 107*   BMET  Recent Labs  08/27/16 0402  NA 135  K 3.2*  CL 105  CO2 22  GLUCOSE 120*  BUN 13  CREATININE 0.64  CALCIUM 7.8*   PT/INR No results for input(s): LABPROT, INR in the last 72 hours. ABG No results for input(s): PHART, HCO3 in the last 72 hours.  Invalid input(s): PCO2, PO2  Studies/Results: No results found.  Anti-infectives: Anti-infectives    Start     Dose/Rate Route Frequency Ordered Stop   08/25/16 1600  ceFAZolin (ANCEF) IVPB 2g/100 mL premix     2 g 200 mL/hr over 30 Minutes Intravenous Every 8 hours 08/25/16 1155 08/26/16 0821   08/24/16 1045  ceFAZolin (ANCEF) IVPB 2g/100 mL premix     2 g 200 mL/hr over 30 Minutes Intravenous To Surgery 08/24/16 1042 08/25/16 0830      Assessment/Plan: Fall from ladder Open book pelvic FX with L SI disruption - S/P SI screw and anterior plate by Dr. Marcelino Scot 3/8, NWB LLE, WBAT RLE for transfers only.   ABL anemia -had good response to transfusion.    Thrombocytopenia - F/U in AM.  Improved/stable.    FEN - tolerated solid food this AM  RML mass/brain mass - likely lung CA with met, will need further eval as outpatient.  VTE - PAS, cannot give Lovenox with PLTs < 100k Dispo - ICU    LOS: 6 days    [facility] 08/28/2016

## 2016-08-29 DIAGNOSIS — W19XXXD Unspecified fall, subsequent encounter: Secondary | ICD-10-CM

## 2016-08-29 DIAGNOSIS — R918 Other nonspecific abnormal finding of lung field: Secondary | ICD-10-CM

## 2016-08-29 DIAGNOSIS — I739 Peripheral vascular disease, unspecified: Secondary | ICD-10-CM

## 2016-08-29 DIAGNOSIS — W1789XA Other fall from one level to another, initial encounter: Secondary | ICD-10-CM

## 2016-08-29 DIAGNOSIS — D62 Acute posthemorrhagic anemia: Secondary | ICD-10-CM

## 2016-08-29 DIAGNOSIS — S32810D Multiple fractures of pelvis with stable disruption of pelvic ring, subsequent encounter for fracture with routine healing: Secondary | ICD-10-CM

## 2016-08-29 DIAGNOSIS — G8918 Other acute postprocedural pain: Secondary | ICD-10-CM

## 2016-08-29 DIAGNOSIS — I25810 Atherosclerosis of coronary artery bypass graft(s) without angina pectoris: Secondary | ICD-10-CM

## 2016-08-29 DIAGNOSIS — M259 Joint disorder, unspecified: Secondary | ICD-10-CM

## 2016-08-29 DIAGNOSIS — I480 Paroxysmal atrial fibrillation: Secondary | ICD-10-CM

## 2016-08-29 DIAGNOSIS — W19XXXA Unspecified fall, initial encounter: Secondary | ICD-10-CM

## 2016-08-29 MED ORDER — ENOXAPARIN SODIUM 40 MG/0.4ML ~~LOC~~ SOLN
40.0000 mg | SUBCUTANEOUS | Status: DC
  Administered 2016-08-29 – 2016-08-31 (×3): 40 mg via SUBCUTANEOUS
  Filled 2016-08-29 (×3): qty 0.4

## 2016-08-29 NOTE — Progress Notes (Signed)
Orthopedic Trauma Service Progress Note    Subjective:  Patient reports pain as mild.    Working with therapy this am  Requiring mod 2+ assist to transfer from bed to chair Pt lives at home with his wife   + BM yesterday  Foley still in   Appetite fair   ROS  As above   Objective:   VITALS:   Vitals:   08/28/16 1400 08/28/16 1420 08/28/16 2002 08/29/16 0518  BP: (!) 146/57 (!) 154/54 (!) 124/48 (!) 150/64  Pulse:  93 89 86  Resp: (!) '24 20 18 18  '$ Temp:  98.1 F (36.7 C) 98.3 F (36.8 C) 98.1 F (36.7 C)  TempSrc:  Oral Oral Oral  SpO2:  97% 94% 97%  Weight:      Height:        Intake/Output      03/11 0701 - 03/12 0700 03/12 0701 - 03/13 0700   P.O.     I.V. (mL/kg) 1888.3 (26.3)    Blood     Other 1000    Total Intake(mL/kg) 2888.3 (40.3)    Urine (mL/kg/hr) 225 (0.1)    Total Output 225     Net +2663.3          Stool Occurrence 1 x      LABS  Results for orders placed or performed during the hospital encounter of 08/22/16 (from the past 24 hour(s))  CBC with Differential/Platelet     Status: Abnormal   Collection Time: 08/28/16 11:35 AM  Result Value Ref Range   WBC 8.0 4.0 - 10.5 K/uL   RBC 2.71 (L) 4.22 - 5.81 MIL/uL   Hemoglobin 8.3 (L) 13.0 - 17.0 g/dL   HCT 23.7 (L) 39.0 - 52.0 %   MCV 87.5 78.0 - 100.0 fL   MCH 30.6 26.0 - 34.0 pg   MCHC 35.0 30.0 - 36.0 g/dL   RDW 14.9 11.5 - 15.5 %   Platelets 112 (L) 150 - 400 K/uL   Neutrophils Relative % 83 %   Neutro Abs 6.7 1.7 - 7.7 K/uL   Lymphocytes Relative 10 %   Lymphs Abs 0.8 0.7 - 4.0 K/uL   Monocytes Relative 6 %   Monocytes Absolute 0.5 0.1 - 1.0 K/uL   Eosinophils Relative 1 %   Eosinophils Absolute 0.1 0.0 - 0.7 K/uL   Basophils Relative 0 %   Basophils Absolute 0.0 0.0 - 0.1 K/uL  Basic metabolic panel     Status: Abnormal   Collection Time: 08/28/16 11:35 AM  Result Value Ref Range   Sodium 134 (L) 135 - 145 mmol/L   Potassium 3.3 (L) 3.5 - 5.1  mmol/L   Chloride 105 101 - 111 mmol/L   CO2 22 22 - 32 mmol/L   Glucose, Bld 139 (H) 65 - 99 mg/dL   BUN 14 6 - 20 mg/dL   Creatinine, Ser 0.64 0.61 - 1.24 mg/dL   Calcium 7.9 (L) 8.9 - 10.3 mg/dL   GFR calc non Af Amer >60 >60 mL/min   GFR calc Af Amer >60 >60 mL/min   Anion gap 7 5 - 15     PHYSICAL EXAM:   Gen: working with therapy, putting good effort in  Abd: distended but nontender  Moderate scrotal edema (expected for pts injury) Pelvis:  Dressing stable Ext:         Bilateral lower extremities   Motor and sensory functions intact  Exts are warm   + peripheral pulses  Assessment/Plan: 4 Days Post-Op   Active Problems:   Fall   Closed pelvic ring fracture (HCC)   Disorder of left sacroiliac joint   Anti-infectives    Start     Dose/Rate Route Frequency Ordered Stop   08/25/16 1600  ceFAZolin (ANCEF) IVPB 2g/100 mL premix     2 g 200 mL/hr over 30 Minutes Intravenous Every 8 hours 08/25/16 1155 08/26/16 0821   08/24/16 1045  ceFAZolin (ANCEF) IVPB 2g/100 mL premix     2 g 200 mL/hr over 30 Minutes Intravenous To Surgery 08/24/16 1042 08/25/16 0830    .  POD/HD#: 66  76 y/o male s/p fall from about 33f   - fall   - APC 2 pelvic ring fracture with L SI diastasis              s/p ORIF anterior ring and L SI screw   NWB L leg  WBAT R leg for transfers   ROM as tolerated  Dressing changes as needed  Ice as needed  Scrotal support    - DVT/PE prophylaxis              start on lovenox today as plts >100k  Ok to resume plavix when deemed appropriate by primary team     - Dispo              continue with therapy  CIR consult  SW consult for SNF    KJari Pigg PA-C [facility] [phone](P) [phone](O) 08/29/2016, 10:02 AM

## 2016-08-29 NOTE — Care Management Important Message (Signed)
Important Message  Patient Details  Name: [name] [name] MRN: [id number] Date of Birth: 03/21/1941   Medicare Important Message Given:  Yes    Jazaria Jarecki Montine Circle 08/29/2016, 12:25 PM

## 2016-08-29 NOTE — Progress Notes (Signed)
Physical Therapy Treatment Patient Details Name: [name] [name] MRN: [id number] DOB: October 07, 1940 Today's Date: 08/29/2016    History of Present Illness  76 y/o male s/p ORIF pubic symphysis disruption and left SI screw fixation. Pt fell from 8-10 feet from a ladder onto back, resulting in Symphysis diastasis and left SI widening. Found to have Left pelvic extraperitoneal hemorrhage with a large hematoma PMH: PVD, MI, HTN, HLD, CAD, COPD, CKD, A-fib    PT Comments    Pt requires cues for NWB status with transfer.  Pt reports feeling fatigued from being up all night with bowel issues.  Wife present and supportive. Con't to recommend CIR.   Follow Up Recommendations  CIR     Equipment Recommendations  Wheelchair (measurements PT);Wheelchair cushion (measurements PT)    Recommendations for Other Services       Precautions / Restrictions Precautions Precautions: Fall Restrictions Weight Bearing Restrictions: Yes RLE Weight Bearing: Weight bearing as tolerated LLE Weight Bearing: Non weight bearing Other Position/Activity Restrictions: bed to chair only for 8 weeks; WBAT RLE for transfers only    Mobility  Bed Mobility Overal bed mobility: Needs Assistance Bed Mobility: Supine to Sit     Supine to sit: +2 for physical assistance;Max assist     General bed mobility comments: pt required (A) to shift UB toward R rail and use of pad to help pivot helicopter bil LE to eob. pt once EOB needed (A) to achieve static sitting  Transfers Overall transfer level: Needs assistance Equipment used: Rolling walker (2 wheeled) Transfers: Sit to/from [facility] Sit to Stand: +2 physical assistance;Max assist Stand pivot transfers: +2 physical assistance;Max assist       General transfer comment: Pt needed (A) to keep weight shifted toward the R side to prevent L LE weight bearing. pt is unable to maintain NWB wtih SPT.  In static standing he was able to maintain briefly,  but needs constant cues.  Ambulation/Gait                 Stairs            Wheelchair Mobility    Modified Rankin (Stroke Patients Only)       Balance   Sitting-balance support: Feet supported;Bilateral upper extremity supported Sitting balance-Leahy Scale: Fair     Standing balance support: Bilateral upper extremity supported Standing balance-Leahy Scale: Poor Standing balance comment: requies UE support                    Cognition Arousal/Alertness: Awake/alert Behavior During Therapy: WFL for tasks assessed/performed   Area of Impairment: Attention;Memory;Following commands;Safety/judgement;Awareness     Memory: Decreased recall of precautions;Decreased short-term memory Following Commands: Follows one step commands with increased time Safety/Judgement: Decreased awareness of safety;Decreased awareness of deficits Awareness: Emergent Problem Solving: Slow processing;Decreased initiation;Difficulty sequencing General Comments: pt wife present and reports patient did not sleep much in the PM due to belly pain from need to void bowel. pt able to void around midnight per wife. Pt required frequent redirection. pt joking at times and recent pass of pain medications. Wife states "i think that medication is working nowDoctor, hospital Comments        Pertinent Vitals/Pain Pain Assessment: Faces Faces Pain Scale: Hurts even more Pain Location: LLE, hips Pain Descriptors / Indicators: Aching;Sore;Grimacing;Guarding Pain Intervention(s): Monitored during session;Premedicated before session;Repositioned    Home Living  Prior Function            PT Goals (current goals can now be found in the care plan section) Acute Rehab PT Goals PT Goal Formulation: With patient/family Time For Goal Achievement: 09/09/16 Potential to Achieve Goals: Good Progress towards PT goals: Progressing toward goals     Frequency    Min 4X/week      PT Plan Current plan remains appropriate    Co-evaluation PT/OT/SLP Co-Evaluation/Treatment: Yes Reason for Co-Treatment: Complexity of the patient's impairments (multi-system involvement);For patient/therapist safety PT goals addressed during session: Mobility/safety with mobility;Proper use of DME       End of Session Equipment Utilized During Treatment: Gait belt;Oxygen Activity Tolerance: Patient tolerated treatment well Patient left: in chair;with call bell/phone within reach;with family/visitor present   PT Visit Diagnosis: Unsteadiness on feet (R26.81);Muscle weakness (generalized) (M62.81);Difficulty in walking, not elsewhere classified (R26.2)     Time: 6015-6153 PT Time Calculation (min) (ACUTE ONLY): 29 min  Charges:  $Therapeutic Activity: 8-22 mins                    G Codes:       [name] [name] 08/29/2016, 1:17 PM

## 2016-08-29 NOTE — Progress Notes (Signed)
Rehab admissions - I met with patient and his wife.  Patient is confused.  Wife is worried that she will not be able to manage at home after rehab.  I gave her rehab booklets and I will meet with her again tomorrow.  Currently rehab beds are full.  Call me for questions.  #[phone]

## 2016-08-29 NOTE — Progress Notes (Signed)
[facility] Progress Note  4 Days Post-Op  Subjective: No complaints. Pain controlled. Working with therapies. Foley in place. Reports having a BM yesterday.  Objective: Vital signs in last 24 hours: Temp:  [98.1 F (36.7 C)-98.3 F (36.8 C)] 98.1 F (36.7 C) (03/12 0518) Pulse Rate:  [82-93] 86 (03/12 0518) Resp:  [18-33] 18 (03/12 0518) BP: (124-154)/(48-68) 150/64 (03/12 0518) SpO2:  [94 %-98 %] 97 % (03/12 0518) Last BM Date: 08/22/16  Intake/Output from previous day: 03/11 0701 - 03/12 0700 In: 2888.3 [I.V.:1888.3] Out: 225 [Urine:225] Intake/Output this shift: No intake/output data recorded.  PE: Gen:  Alert, NAD, pleasant and cooperative Card:  RRR Pulm:  Non-labored, CTAB Abd: Soft non-tender, non-distended, +BS, reducible umbilical hernia. GU: foley in place, significant scrotal swelling Ext:  Bilateral lower extremity edema present  Lab Results:   Recent Labs  08/27/16 1333 08/28/16 1135  WBC 7.8 8.0  HGB 8.8* 8.3*  HCT 25.8* 23.7*  PLT 107* 112*   BMET  Recent Labs  08/27/16 0402 08/28/16 1135  NA 135 134*  K 3.2* 3.3*  CL 105 105  CO2 22 22  GLUCOSE 120* 139*  BUN 13 14  CREATININE 0.64 0.64  CALCIUM 7.8* 7.9*   PT/INR No results for input(s): LABPROT, INR in the last 72 hours. CMP     Component Value Date/Time   NA 134 (L) 08/28/2016 1135   NA 142 03/26/2016 0801   K 3.3 (L) 08/28/2016 1135   CL 105 08/28/2016 1135   CO2 22 08/28/2016 1135   GLUCOSE 139 (H) 08/28/2016 1135   BUN 14 08/28/2016 1135   BUN 16 03/26/2016 0801   CREATININE 0.64 08/28/2016 1135   CREATININE 0.98 06/28/2014 0819   CALCIUM 7.9 (L) 08/28/2016 1135   PROT 5.3 (L) 08/23/2016 0333   PROT 6.4 03/26/2016 0801   ALBUMIN 3.1 (L) 08/23/2016 0333   ALBUMIN 4.3 03/26/2016 0801   AST 30 08/23/2016 0333   ALT 22 08/23/2016 0333   ALKPHOS 59 08/23/2016 0333   BILITOT 1.2 08/23/2016 0333   BILITOT 0.7 03/26/2016 0801   GFRNONAA >60 08/28/2016  1135   GFRAA >60 08/28/2016 1135   Lipase     Component Value Date/Time   LIPASE 33 07/20/2007 1843       Studies/Results: No results found.  Anti-infectives: Anti-infectives    Start     Dose/Rate Route Frequency Ordered Stop   08/25/16 1600  ceFAZolin (ANCEF) IVPB 2g/100 mL premix     2 g 200 mL/hr over 30 Minutes Intravenous Every 8 hours 08/25/16 1155 08/26/16 0821   08/24/16 1045  ceFAZolin (ANCEF) IVPB 2g/100 mL premix     2 g 200 mL/hr over 30 Minutes Intravenous To Surgery 08/24/16 1042 08/25/16 0830     Assessment/Plan Fall from ladder Open book pelvic FX with L SI disruption- S/P SI screw and anterior plate by Dr. Marcelino Scot 3/8, NWB LLE, WBAT RLE for transfers only.  ROM as tolerated. Dressing changes/ice PRN. ABL anemia-had good response to transfusion.    Thrombocytopenia- Improved/stable. Platelets 112 today FEN- tolerating Regular diet  RML mass/brain mass- likely lung CA with met, will need further eval as outpatient.  VTE- PAS, start Lovenox today Dispo- floor, continue foley 2/2 swelling Start lovenox and continue therapies CBC in AM and possibly restart plavix tomorrow  CIR consult pending    LOS: 7 days    Jill Alexanders , [facility] 08/29/2016, 10:39 AM Pager: [phone] Consults: [phone]  Mon-Fri 7:00 am-4:30 pm Sat-Sun 7:00 am-11:30 am

## 2016-08-29 NOTE — Progress Notes (Signed)
Occupational Therapy Treatment Patient Details Name: [name] [name] MRN: [id number] DOB: 1941/02/02 Today's Date: 08/29/2016    History of present illness  76 y/o male s/p ORIF pubic symphysis disruption and left SI screw fixation. Pt fell from 8-10 feet from a ladder onto back, resulting in Symphysis diastasis and left SI widening. Found to have Left pelvic extraperitoneal hemorrhage with a large hematoma PMH: PVD, MI, HTN, HLD, CAD, COPD, CKD, A-fib   OT comments  Pt in a very joking mood during session and needed redirection back to task several times. Pt asking very vague questions at time not related to task. Question if pain medication affecting cognitive state. Pt transferred to the R oob to chair. Pt reports incr comfort in chair compared to bed.  Pt required total +2 (A) for basic transfers at this time and total (A) for LB peri care.   Follow Up Recommendations  CIR;Supervision/Assistance - 24 hour    Equipment Recommendations  Other (comment)    Recommendations for Other Services      Precautions / Restrictions Precautions Precautions: Fall Restrictions Weight Bearing Restrictions: Yes RLE Weight Bearing: Weight bearing as tolerated LLE Weight Bearing: Non weight bearing       Mobility Bed Mobility Overal bed mobility: Needs Assistance Bed Mobility: Supine to Sit     Supine to sit: +2 for physical assistance;Max assist     General bed mobility comments: pt required (A) to shift UB toward R rail and use of pad to help pivot helicopter bil LE to eob. pt once EOB needed (A) to achieve static sitting  Transfers Overall transfer level: Needs assistance Equipment used: Rolling walker (2 wheeled) Transfers: Sit to/from [facility] Sit to Stand: +2 physical assistance;Max assist Stand pivot transfers: +2 physical assistance;Max assist       General transfer comment: Pt needed (A) to keep weight shifted toward the R side to prevent L LE weight  bearing. pt is unable to maintain NWB    Balance                                   ADL Overall ADL's : Needs assistance/impaired     Grooming: Wash/dry face;Supervision/safety;Sitting       Lower Body Bathing: Total assistance Lower Body Bathing Details (indicate cue type and reason): pt with incontinence of stool and lack of awareness Upper Body Dressing : Moderate assistance       Toilet Transfer: +2 for physical assistance;+2 for safety/equipment;Maximal assistance Toilet Transfer Details (indicate cue type and reason): pt needed cues to maintain L LE NWB. pt requires (A) to shift weight. Pt sitting prematurely and states "i am going to fall"            General ADL Comments: Pt transfered EOB to chair with R transfer. Pt requires RW and max cues. pt complete sit<>Stand from chair to help with positioning of towel for scrotal edema.       Vision                     Perception     Praxis      Cognition   Behavior During Therapy: Flat affect Overall Cognitive Status: Impaired/Different from baseline Area of Impairment: Attention;Memory;Following commands;Safety/judgement;Awareness   Current Attention Level: Sustained Memory: Decreased recall of precautions;Decreased short-term memory  Following Commands: Follows one step commands with increased time Safety/Judgement: Decreased awareness of safety;Decreased awareness  of deficits Awareness: Emergent Problem Solving: Slow processing;Decreased initiation;Difficulty sequencing General Comments: pt wife present and reports patient did not sleep much in the PM due to belly pain from need to void bowel. pt able to void around midnight per wife. Pt required frequent redirection. pt joking at times and recent pass of pain medications. 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      Pertinent Vitals/ Pain       Pain  Assessment: Faces Faces Pain Scale: Hurts even more Pain Location: LLE, hips Pain Descriptors / Indicators: Aching;Sore;Grimacing;Guarding Pain Intervention(s): Monitored during session;Premedicated before session;Repositioned  Home Living                                          Prior Functioning/Environment              Frequency  Min 2X/week        Progress Toward Goals  OT Goals(current goals can now be found in the care plan section)  Progress towards OT goals: Progressing toward goals  Acute Rehab OT Goals Patient Stated Goal: to get better OT Goal Formulation: With patient/family Time For Goal Achievement: 09/10/16 Potential to Achieve Goals: Good ADL Goals Pt Will Perform Grooming: with set-up;with supervision;sitting Pt Will Perform Lower Body Bathing: with min guard assist;sitting/lateral leans Pt Will Perform Lower Body Dressing: with min guard assist;sitting/lateral leans Pt Will Transfer to Toilet: with mod assist;stand pivot transfer;bedside commode Pt Will Perform Toileting - Clothing Manipulation and hygiene: with min guard assist;sitting/lateral leans  Plan Discharge plan remains appropriate    Co-evaluation    PT/OT/SLP Co-Evaluation/Treatment: Yes Reason for Co-Treatment: Complexity of the patient's impairments (multi-system involvement);Necessary to address cognition/behavior during functional activity;For patient/therapist safety;To address functional/ADL transfers   OT goals addressed during session: ADL's and self-care;Proper use of Adaptive equipment and DME      End of Session Equipment Utilized During Treatment: Gait belt;Rolling walker  OT Visit Diagnosis: Other abnormalities of gait and mobility (R26.89)   Activity Tolerance Patient tolerated treatment well   Patient Left in chair;with call bell/phone within reach;with family/visitor present   Nurse Communication Mobility status;Precautions;Weight bearing status         Time: 9323-5573 OT Time Calculation (min): 26 min  Charges: OT General Charges $OT Visit: 1 Procedure OT Treatments $Therapeutic Activity: 8-22 mins   Jeri Modena   OTR/L Pager: [phone] Office: [phone] .    Parke Poisson B 08/29/2016, 10:45 AM

## 2016-08-29 NOTE — Consult Note (Signed)
Physical Medicine and Rehabilitation Consult Reason for Consult: Closed pelvic ring fracture Referring Physician: Trauma services right handed   HPI: [name] [name] is a 76 y.o. male with history of PVD with multiple revascularization procedures, left carotid surgery, CAD status post CABG, atrial fibrillation maintained on aspirin and Plavix. History taken from chart review, patient, and wife. Patient lives with spouse. Independent prior to admission. One level home with 2 steps to entry. Presented 08/22/2016 after a fall from a ladder after attempting to clean out his stove pipe. Denied loss of consciousness. Cranial CT scan reviewed, unremarkable for acute process. CT cervical spine negative. CT abdomen and pelvis showed disruption of the symphysis pubis and left sacroiliac joint with retroperitoneal hemorrhage in the left side of the pelvis and subcutaneous hemorrhage in the left buttocks as well as fractures left transverse process of L2-S1. Also with incidental findings of spiculated mass in the right middle lobe consistent with carcinoma of the lung with metastatic adenopathy in the subcarinal region. Several small pulmonary nodules which are too small to characterize. Underwent open reduction internal fixation of pubic symphysis with sacroiliac screw fixation of left SI joint 08/25/2016 per Dr. Marcelino Scot. Nonweightbearing left lower extremity. Weightbearing as tolerated right lower extremity for transfers only. Bed to chair only for 8 weeks. Hospital course pain management. Lung mass to be evaluated as an outpatient. Acute blood loss anemia 6.9 transfused late hemoglobin 8.3. Subcutaneous Lovenox added for DVT prophylaxis 08/29/2016.   Review of Systems  Constitutional: Negative for chills and fever.  HENT: Positive for hearing loss. Negative for tinnitus.   Eyes: Negative for blurred vision and double vision.  Respiratory: Negative for cough.        Occasional shortness of breath    Cardiovascular: Positive for leg swelling. Negative for chest pain.  Gastrointestinal: Positive for constipation. Negative for nausea and vomiting.       GERD  Genitourinary: Positive for urgency.  Musculoskeletal: Positive for falls.  Skin: Negative for rash.  Neurological: Positive for weakness and headaches. Negative for seizures.  All other systems reviewed and are negative.  Past Medical History:  Diagnosis Date  . Atrial fibrillation ([place])    no hx of reported at preop visit of 04/21/11   . CAD (coronary artery disease)   . Carotid artery occlusion    left carotid endarterectomy  . Chronic kidney disease    AAA repair with reimplant of renals   . CHRONIC OBSTRUCTIVE PULMONARY DISEASE    pt denied at visit of 04/21/11   . CORONARY ARTERY DISEASE   . Disorder of left sacroiliac joint 08/24/2016  . DIVERTICULOSIS OF COLON   . GERD   . H/O hiatal hernia   . Headache(784.0)    Hx: of years of years  . HYPERTENSION   . HYPOTHYROIDISM   . JOINT EFFUSION, KNEE   . KNEE, ARTHRITIS, DEGEN./OSTEO    right knee   . Myocardial infarction    1994   . NEPHROLITHIASIS, HX OF   . OSTEOPOROSIS   . Other dysphagia   . PERIPHERAL VASCULAR DISEASE    AAA - 1994 with reimplant of renals   . Peripheral vascular disease ([place])    subclavian stenosis PTA - 3/08   . Pure hypercholesterolemia   . Renal artery stenosis [facility])    Past Surgical History:  Procedure Laterality Date  . ABDOMINAL AORTIC ANEURYSM REPAIR     with reimplantation of renals   . CARDIAC CATHETERIZATION  2008  . CAROTID ENDARTERECTOMY Left Jan. 30, 2015   CEA  . CHOLECYSTECTOMY  2000   Gall Bladder  . COLONOSCOPY W/ BIOPSIES AND POLYPECTOMY     Hx: of  . CORONARY ARTERY BYPASS GRAFT     1995  . CORONARY STENT PLACEMENT     Hx: of  . ENDARTERECTOMY Left 07/19/2013   Procedure: ENDARTERECTOMY CAROTID;  Surgeon: Mal Misty, MD;  Location: [facility];  Service: Vascular;  Laterality: Left;  . GALLBLADDER SURGERY   2004   . JOINT REPLACEMENT     partial knee replacement on left 2002   . KNEE SURGERY    . ORIF PELVIC FRACTURE Left 08/25/2016   Procedure: OPEN REDUCTION INTERNAL FIXATION (ORIF) PELVIC FRACTURE; plate on front, SI screw on the back;  Surgeon: Altamese Dutchess, MD;  Location: [facility];  Service: Orthopedics;  Laterality: Left;  . OTHER SURGICAL HISTORY     left subclavian stenosis surgery PTA 08/2006   . OTHER SURGICAL HISTORY     carotid surgery on right 2004   . TOTAL KNEE ARTHROPLASTY  04/29/2011   Procedure: TOTAL KNEE ARTHROPLASTY;  Surgeon: Gearlean Alf;  Location: [facility];  Service: Orthopedics;  Laterality: Right;  [name] VASCULAR SURGERY     AAA  . VASECTOMY  1973   Family History  Problem Relation Age of Onset  . Hypertension Mother   . Heart disease Father   . Heart attack Father   . Cancer Brother     Lung  . Diabetes Sister    Social History:  reports that he quit smoking about 24 years ago. His smoking use included Cigarettes. He has never used smokeless tobacco. He reports that he does not drink alcohol or use drugs. Allergies:  Allergies  Allergen Reactions  . Imitrex [Sumatriptan] Nausea And Vomiting and Other (See Comments)    "made me like I was having a stoke"  . Lipitor [Atorvastatin] Nausea And Vomiting and Other (See Comments)    INTOLERANCE > MYALGIAS "couldn't get out of the bed ( pt had to crawl out of bed ) I hurt so bad"   Medications Prior to Admission  Medication Sig Dispense Refill  . acetaminophen (TYLENOL) 325 MG tablet Take 325-650 mg by mouth every 6 (six) hours as needed (for pain).    [name] aspirin EC 81 MG tablet Take 1 tablet (81 mg total) by mouth daily. 30 tablet 5  . clopidogrel (PLAVIX) 75 MG tablet TAKE ONE (1) TABLET EACH DAY 30 tablet 5  . Coenzyme Q10 (CO Q-10 PO) Take 1 capsule by mouth daily.    . lansoprazole (PREVACID) 15 MG capsule Take 1 capsule (15 mg total) by mouth daily. 30 capsule 5  . levothyroxine (SYNTHROID, LEVOTHROID) 112 MCG  tablet Take 1 tablet (112 mcg total) by mouth daily. 30 tablet 5  . metoprolol (LOPRESSOR) 50 MG tablet Take 1 tablet (50 mg total) by mouth 2 (two) times daily. 60 tablet 5  . niacin (SLO-NIACIN) 500 MG tablet Take 1,500 mg by mouth at bedtime.    . nitroGLYCERIN (NITROSTAT) 0.4 MG SL tablet Place 1 tablet (0.4 mg total) under the tongue every 5 (five) minutes as needed for chest pain. 25 tablet 10  . rosuvastatin (CRESTOR) 20 MG tablet Take 1 tablet (20 mg total) by mouth daily. 30 tablet 5  . tamsulosin (FLOMAX) 0.4 MG CAPS capsule Take 1 capsule (0.4 mg total) by mouth daily. 30 capsule 5  . triamterene-hydrochlorothiazide (MAXZIDE-25) 37.5-25 MG  tablet Take 1 tablet by mouth every morning. 30 tablet 5    Home: Home Living Family/patient expects to be discharged to:: Inpatient rehab Living Arrangements: Spouse/significant other Available Help at Discharge: Family, Available 24 hours/day Type of Home: House Home Access: Stairs to enter [employer] of Steps: 2 through one way and 6 through other; plan to put ramp where the 2 steps are Entrance Stairs-Rails: Right Home Layout: One level Bathroom Shower/Tub: Chiropodist: Standard Home Equipment: Environmental consultant - 2 wheels, Bedside commode, Transport chair  Functional History: Prior Function Level of Independence: Independent Comments: Drives, cooks, cleans.  Functional Status:  Mobility: Bed Mobility Overal bed mobility: Needs Assistance Bed Mobility: Supine to Sit Supine to sit: Mod assist, +2 for physical assistance General bed mobility comments: Assist for LEs and trunk elevation with use of bed pad to scoot hips. Transfers Overall transfer level: Needs assistance Equipment used: 2 person hand held assist Transfers: Sit to/from Stand, Stand Pivot Transfers Sit to Stand: Mod assist, +2 physical assistance Stand pivot transfers: Mod assist, +2 physical assistance General transfer comment: Assist to boost  up from EOB and for stand pivot to chair. Assist provided to keep LLE NWB.      ADL: ADL Overall ADL's : Needs assistance/impaired Grooming: Min guard, Sitting Upper Body Bathing: Minimal assistance, Sitting Lower Body Bathing: Maximal assistance, Sitting/lateral leans Upper Body Dressing : Minimal assistance, Sitting Lower Body Dressing: Maximal assistance, Sitting/lateral leans Toilet Transfer: Moderate assistance, +2 for physical assistance, Stand-pivot, BSC Toilet Transfer Details (indicate cue type and reason): Simulated by stand pivot from EOB to chair Functional mobility during ADLs: Moderate assistance, +2 for physical assistance General ADL Comments: Needs cues and physical assist for NWB on LLE.  Cognition: Cognition Overall Cognitive Status: Impaired/Different from baseline Orientation Level: Oriented to person, Oriented to place Cognition Arousal/Alertness: Awake/alert Behavior During Therapy: Flat affect Overall Cognitive Status: Impaired/Different from baseline Area of Impairment: Attention, Safety/judgement, Awareness, Problem solving Current Attention Level: Selective Following Commands: Follows one step commands inconsistently Safety/Judgement: Decreased awareness of safety, Decreased awareness of deficits Awareness: Emergent Problem Solving: Requires verbal cues, Requires tactile cues, Difficulty sequencing General Comments: Daughter reports pt close to baseline cognitively. Repeating himself throghout session and focused on WB status.  perseverative and some difficulty with processing of activity during session  Blood pressure (!) 150/64, pulse 86, temperature 98.1 F (36.7 C), temperature source Oral, resp. rate 18, height '5\' 6"'$  (1.676 m), weight 71.7 kg (158 lb), SpO2 97 %. Physical Exam  Vitals reviewed. Constitutional: He is oriented to person, place, and time. He appears well-developed and well-nourished.  HENT:  Head: Normocephalic.  Right Ear:  External ear normal.  Left Ear: External ear normal.  Eyes: Conjunctivae and EOM are normal.  Neck: Normal range of motion. Neck supple. No thyromegaly present.  Cardiovascular:  Cardiac rate controlled  Respiratory: Effort normal and breath sounds normal.  +Luna  GI: Soft. Bowel sounds are normal. He exhibits no distension.  Genitourinary:  Genitourinary Comments: Mild scrotal edema  Musculoskeletal: He exhibits edema and tenderness.  Neurological: He is alert and oriented to person, place, and time.  HOH Sensation intact to light touch DTRs symmetric Motor: B/l UE 4+/5  B/l LE HF 2+/5, KE 4/5, ADF/PF 5/5  Skin: Skin is warm and dry.  ORIF site clean and dry with dressing intact  Psychiatric: He has a normal mood and affect. His behavior is normal.    Results for orders placed or performed during the hospital  encounter of 08/22/16 (from the past 24 hour(s))  CBC with Differential/Platelet     Status: Abnormal   Collection Time: 08/28/16 11:35 AM  Result Value Ref Range   WBC 8.0 4.0 - 10.5 K/uL   RBC 2.71 (L) 4.22 - 5.81 MIL/uL   Hemoglobin 8.3 (L) 13.0 - 17.0 g/dL   HCT 23.7 (L) 39.0 - 52.0 %   MCV 87.5 78.0 - 100.0 fL   MCH 30.6 26.0 - 34.0 pg   MCHC 35.0 30.0 - 36.0 g/dL   RDW 14.9 11.5 - 15.5 %   Platelets 112 (L) 150 - 400 K/uL   Neutrophils Relative % 83 %   Neutro Abs 6.7 1.7 - 7.7 K/uL   Lymphocytes Relative 10 %   Lymphs Abs 0.8 0.7 - 4.0 K/uL   Monocytes Relative 6 %   Monocytes Absolute 0.5 0.1 - 1.0 K/uL   Eosinophils Relative 1 %   Eosinophils Absolute 0.1 0.0 - 0.7 K/uL   Basophils Relative 0 %   Basophils Absolute 0.0 0.0 - 0.1 K/uL  Basic metabolic panel     Status: Abnormal   Collection Time: 08/28/16 11:35 AM  Result Value Ref Range   Sodium 134 (L) 135 - 145 mmol/L   Potassium 3.3 (L) 3.5 - 5.1 mmol/L   Chloride 105 101 - 111 mmol/L   CO2 22 22 - 32 mmol/L   Glucose, Bld 139 (H) 65 - 99 mg/dL   BUN 14 6 - 20 mg/dL   Creatinine, Ser 0.64 0.61 -  1.24 mg/dL   Calcium 7.9 (L) 8.9 - 10.3 mg/dL   GFR calc non Af Amer >60 >60 mL/min   GFR calc Af Amer >60 >60 mL/min   Anion gap 7 5 - 15   No results found.  Assessment/Plan: Diagnosis: Polytrauma Labs and images independently reviewed.  Records reviewed and summated above.  1. Does the need for close, 24 hr/day medical supervision in concert with the patient's rehab needs make it unreasonable for this patient to be served in a less intensive setting? Potentially 2. Co-Morbidities requiring supervision/potential complications: Acute blood loss anemia (transfuse if necessary to ensure appropriate perfusion for increased activity tolerance), Lung mass ( follow up as outpt), post-op pain management (Biofeedback training with therapies to help reduce reliance on opiate pain medications, monitor pain control during therapies, and sedation at rest and titrate to maximum efficacy to ensure participation and gains in therapies), PVD (cont meds), left carotid surgery, CAD status post CABG (cont meds), atrial fibrillation (cont meds, monitor HR with increased activity), pelvic ring fracture s/p ORIF (transfers only x8 weeks) 3. Due to bladder management, safety, skin/wound care, disease management, pain management and patient education, does the patient require 24 hr/day rehab nursing? Yes 4. Does the patient require coordinated care of a physician, rehab nurse, PT (1-2 hrs/day, 5 days/week) and OT (1-2 hrs/day, 5 days/week) to address physical and functional deficits in the context of the above medical diagnosis(es)? Yes Addressing deficits in the following areas: balance, endurance, strength, transferring, bowel/bladder control, bathing, dressing, toileting and psychosocial support 5. Can the patient actively participate in an intensive therapy program of at least 3 hrs of therapy per day at least 5 days per week? Yes 6. The potential for patient to make measurable gains while on inpatient rehab is  excellent 7. Anticipated functional outcomes upon discharge from inpatient rehab are Mod I at wheelchair level, supervision for transfers  with PT, Mod I at wheelchair level, supervision for transfers  with OT, n/a with SLP. 8. Estimated rehab length of stay to reach the above functional goals is: 4-7 days. 9. Does the patient have adequate social supports and living environment to accommodate these discharge functional goals? Yes 10. Anticipated D/C setting: Home 11. Anticipated post D/C treatments: HH therapy and Home excercise program 12. Overall Rehab/Functional Prognosis: excellent  RECOMMENDATIONS: This patient's condition is appropriate for continued rehabilitative care in the following setting: CIR Patient has agreed to participate in recommended program. Yes Note that insurance prior authorization may be required for reimbursement for recommended care.  Comment: Rehab Admissions Coordinator to follow up.  Delice Lesch, MD, Mellody Drown Cathlyn Parsons., PA-C 08/29/2016

## 2016-08-29 NOTE — Progress Notes (Signed)
Noted order to d/c foley, pt has large amount of penile and scrotal edema. PA notes today read keeping foley due to edema. Contacted PA Elizabeth, foley not to be d/c'd at this time due to large amount of penile edema. Also informed PA that pt's wife states pt has always had trouble voiding on a normal basis. Discontinuing will be addressed at a later time after swelling has decreased.

## 2016-08-30 LAB — CBC
HCT: 26.7 % — ABNORMAL LOW (ref 39.0–52.0)
HEMOGLOBIN: 9.2 g/dL — AB (ref 13.0–17.0)
MCH: 30.4 pg (ref 26.0–34.0)
MCHC: 34.5 g/dL (ref 30.0–36.0)
MCV: 88.1 fL (ref 78.0–100.0)
PLATELETS: 141 10*3/uL — AB (ref 150–400)
RBC: 3.03 MIL/uL — AB (ref 4.22–5.81)
RDW: 15.5 % (ref 11.5–15.5)
WBC: 8.2 10*3/uL (ref 4.0–10.5)

## 2016-08-30 MED ORDER — CLOPIDOGREL BISULFATE 75 MG PO TABS
75.0000 mg | ORAL_TABLET | Freq: Every day | ORAL | Status: DC
  Administered 2016-08-30 – 2016-08-31 (×2): 75 mg via ORAL
  Filled 2016-08-30 (×2): qty 1

## 2016-08-30 NOTE — Progress Notes (Signed)
Rehab admissions - I met with patient and his wife again today.  They would like inpatient rehab admission prior to home.  Currently rehab beds are full.  I will check back tomorrow for bed availability and potential inpatient rehab admission.  Call me for questions.  #[phone]

## 2016-08-30 NOTE — Progress Notes (Signed)
[facility] Progress Note  5 Days Post-Op  Subjective: Sitting in bed, wife at bedside. denies pain. Tolerating PO.  Wife and patient hopeful for admission to CIR.    Objective: Vital signs in last 24 hours: Temp:  [97.6 F (36.4 C)-99.2 F (37.3 C)] 97.6 F (36.4 C) (03/13 0552) Pulse Rate:  [70-125] 125 (03/13 0552) Resp:  [18] 18 (03/13 0552) BP: (129-135)/(63-68) 135/66 (03/13 0552) SpO2:  [100 %] 100 % (03/13 0552) Last BM Date: 08/29/16 (EARLY AM TODAY )  Intake/Output from previous day: 03/12 0701 - 03/13 0700 In: 1130 [P.O.:500; I.V.:630] Out: -  Intake/Output this shift: No intake/output data recorded.  PE: Gen:  Alert, NAD, pleasant and cooperative Card:  RRR Pulm:  Non-labored, CTAB Abd: Soft non-tender, non-distended, +BS, reducible umbilical hernia. GU: foley in place, significant scrotal swelling Ext:  Bilateral lower extremity edema present Neuro: oriented to person, place, time.   Lab Results:   Recent Labs  08/28/16 1135 08/30/16 0724  WBC 8.0 8.2  HGB 8.3* 9.2*  HCT 23.7* 26.7*  PLT 112* 141*   BMET  Recent Labs  08/28/16 1135  NA 134*  K 3.3*  CL 105  CO2 22  GLUCOSE 139*  BUN 14  CREATININE 0.64  CALCIUM 7.9*   CMP     Component Value Date/Time   NA 134 (L) 08/28/2016 1135   NA 142 03/26/2016 0801   K 3.3 (L) 08/28/2016 1135   CL 105 08/28/2016 1135   CO2 22 08/28/2016 1135   GLUCOSE 139 (H) 08/28/2016 1135   BUN 14 08/28/2016 1135   BUN 16 03/26/2016 0801   CREATININE 0.64 08/28/2016 1135   CREATININE 0.98 06/28/2014 0819   CALCIUM 7.9 (L) 08/28/2016 1135   PROT 5.3 (L) 08/23/2016 0333   PROT 6.4 03/26/2016 0801   ALBUMIN 3.1 (L) 08/23/2016 0333   ALBUMIN 4.3 03/26/2016 0801   AST 30 08/23/2016 0333   ALT 22 08/23/2016 0333   ALKPHOS 59 08/23/2016 0333   BILITOT 1.2 08/23/2016 0333   BILITOT 0.7 03/26/2016 0801   GFRNONAA >60 08/28/2016 1135   GFRAA >60 08/28/2016 1135   Lipase     Component  Value Date/Time   LIPASE 33 07/20/2007 1843   Anti-infectives: Anti-infectives    Start     Dose/Rate Route Frequency Ordered Stop   08/25/16 1600  ceFAZolin (ANCEF) IVPB 2g/100 mL premix     2 g 200 mL/hr over 30 Minutes Intravenous Every 8 hours 08/25/16 1155 08/26/16 0821   08/24/16 1045  ceFAZolin (ANCEF) IVPB 2g/100 mL premix     2 g 200 mL/hr over 30 Minutes Intravenous To Surgery 08/24/16 1042 08/25/16 0830     Assessment/Plan Fall from ladder Open book pelvic FX with L SI disruption- S/P SI screw and anterior plate by Dr. Marcelino Scot 3/8, NWB LLE, WBAT RLE for transfers only. ROM as tolerated. Dressing changes/ice PRN. ABL anemia-had good response to transfusion. Hgb/hct stable Thrombocytopenia- Improved/stable. Platelets 141 today  FEN- tolerating Regular diet  RML mass/brain mass- likely lung CA with met, will need further eval as outpatient.  VTE- PAS, Lovenox  Dispo - continue foley 2/2 scrotal swelling   Re-start plavix Continue therapies; possible admission to CIR, no beds were available yesterday   LOS: 8 days    Jill Alexanders , [facility] 08/30/2016, 10:09 AM Pager: [phone] Consults: [phone] Mon-Fri 7:00 am-4:30 pm Sat-Sun 7:00 am-11:30 am

## 2016-08-30 NOTE — Progress Notes (Signed)
Orthopaedic Trauma Service (OTS)   Subjective: Patient reports pain as mild.  Denies numbness tingling; transferring with some difficulty to the chair. Positive BM Sunday night.  Objective: Temp:  [97.6 F (36.4 C)-99.2 F (37.3 C)] 97.6 F (36.4 C) (03/13 0552) Pulse Rate:  [70-125] 125 (03/13 0552) Resp:  [18] 18 (03/13 0552) BP: (129-135)/(63-68) 135/66 (03/13 0552) SpO2:  [100 %] 100 % (03/13 0552) Physical Exam R&LLE Dressing intact, clean, dry  Edema/ swelling moderate around upper thighs  Sens: DPN, SPN, TN intact  Motor: EHL, FHL, and lessor toe ext and flex all intact grossly  Brisk cap refill, warm to touch Wounds look great.   Assessment/Plan: 5 Days Post-Op Procedure(s) (LRB): OPEN REDUCTION INTERNAL FIXATION (ORIF) PELVIC FRACTURE; plate on front, SI screw on the back (Left) 1. WBAT on the RLE for xfers 2. Anticipating Rehab 3. Exercise bike would be good  Altamese Electra, MD [facility] [phone] (p)

## 2016-08-30 NOTE — Progress Notes (Signed)
Physical Therapy Treatment Patient Details Name: [name] [name] MRN: [id number] DOB: Jan 31, 1941 Today's Date: 08/30/2016    History of Present Illness  76 y/o male s/p ORIF pubic symphysis disruption and left SI screw fixation. Pt fell from 8-10 feet from a ladder onto back, resulting in Symphysis diastasis and left SI widening. Found to have Left pelvic extraperitoneal hemorrhage with a large hematoma PMH: PVD, MI, HTN, HLD, CAD, COPD, CKD, A-fib    PT Comments    Pt performed increased activity.  PTA assisted patient to edge of bed and assisted to chair with +2 external assistance.   In chair patient able to tolerated seated and reclined exercises to improve strength.  Pt also educated on the importance of incentive spirometer use and frequency.  CIR remains an appropriate plan at this time.  Will continue with transfer training next session.      Follow Up Recommendations  CIR     Equipment Recommendations  Wheelchair (measurements PT);Wheelchair cushion (measurements PT)    Recommendations for Other Services Rehab consult;OT consult     Precautions / Restrictions Precautions Precautions: Fall Restrictions Weight Bearing Restrictions: Yes RLE Weight Bearing: Weight bearing as tolerated LLE Weight Bearing: Non weight bearing Other Position/Activity Restrictions: bed to chair only for 8 weeks; WBAT RLE for transfers only    Mobility  Bed Mobility Overal bed mobility: Needs Assistance Bed Mobility: Supine to Sit;Sit to Supine     Supine to sit: Max assist     General bed mobility comments: Pt performed slow helicopter pivot to edge of bed.  PTA assisted B LEs to edge of bed and assisted patient with trunk elevation.  Pt with VCs to keep legs close and avoid excessive hip abduction.    Transfers Overall transfer level: Needs assistance Equipment used: None Transfers: Squat Pivot Transfers     Squat pivot transfers: Mod assist;+2 physical assistance     General  transfer comment: Pt performed squat pivot to the R with emphasis on weight bearing restriction.  PTA blocked R knee and NT assisted with L side.  Pt required decreased assist with this method.  He remains to present with strength deficit in LUE which limits his ability to use RW.  Pt performed on RA with O2 sats 91%.    Ambulation/Gait Ambulation/Gait assistance:  (Pt is transfers only for 8 weeks.  )               Stairs            Wheelchair Mobility    Modified Rankin (Stroke Patients Only)       Balance Overall balance assessment: Needs assistance   Sitting balance-Leahy Scale: Fair       Standing balance-Leahy Scale: Poor                      Cognition Arousal/Alertness: Awake/alert Behavior During Therapy: WFL for tasks assessed/performed Overall Cognitive Status: Impaired/Different from baseline Area of Impairment: Safety/judgement       Following Commands: Follows one step commands consistently Safety/Judgement: Decreased awareness of safety;Decreased awareness of deficits     General Comments: Wife does report that patient wakes up and states he is going to get OOB.  This behavior however was not observed.  Pt aware he cannot get up without assistance and he is able to verbalize weight bearing restriction.      Exercises General Exercises - Lower Extremity Ankle Circles/Pumps: AROM;Both;10 reps;Supine Quad Sets: AROM;Both;10 reps;Supine Long Arc Quad:  AROM;Both;10 reps;Seated Heel Slides: AROM;Both;10 reps;Supine Hip ABduction/ADduction:  (towel squeeze 1x10 reps with 5 sec hold.  ) Other Exercises Other Exercises: Incentive Spirometer 1x10 reps 1035m-1250ml.  After using Incentive spirometer O2 sats improved to 95%.  Cues for technique.  Educated patient to perform 1x10 every hour he is awake.      General Comments        Pertinent Vitals/Pain Pain Assessment: 0-10 Pain Score: 5  Pain Location: LLE hips Pain Descriptors /  Indicators: Aching;Sore;Grimacing;Guarding Pain Intervention(s): Monitored during session;Repositioned    Home Living                      Prior Function            PT Goals (current goals can now be found in the care plan section) Acute Rehab PT Goals Patient Stated Goal: to get better Potential to Achieve Goals: Good Progress towards PT goals: Progressing toward goals    Frequency    Min 4X/week      PT Plan Current plan remains appropriate    Co-evaluation             End of Session Equipment Utilized During Treatment: Gait belt Activity Tolerance: Patient tolerated treatment well Patient left: in chair;with call bell/phone within reach;with family/visitor present Nurse Communication: Mobility status PT Visit Diagnosis: Unsteadiness on feet (R26.81);Muscle weakness (generalized) (M62.81);Difficulty in walking, not elsewhere classified (R26.2)     Time: 14103-0131PT Time Calculation (min) (ACUTE ONLY): 32 min  Charges:  $Therapeutic Exercise: 8-22 mins $Therapeutic Activity: 8-22 mins                    G Codes:       ACristela Blue3Apr 09, 2018 1:52 PM AGovernor Rooks PTA pager [phone]

## 2016-08-30 NOTE — Progress Notes (Signed)
Case Management Note  Patient Details  Name: [name] [name] MRN: [id number] Date of Birth: [birth year]  Subjective/Objective:   Pt admitted on 08/22/16 s/p fall from ladder with pelvic fractures and back pain/contusion.  PTA, pt independent and from home with spouse.                    Action/Plan: Pt to OR today for ORIF of the symphysis and Lt SI screw fixation.  Will follow for discharge planning as pt progresses.    Expected Discharge Date:                         Expected Discharge Plan:  [place]  In-House Referral:     Discharge planning Services  CM Consult  Post Acute Care Choice:    Choice offered to:     DME Arranged:    DME Agency:     HH Arranged:    [place] Agency:     Status of Service:  In process, will continue to follow  If discussed at Long Length of Stay Meetings, dates discussed:    Additional Comments:  08/30/16 J. Kenishia Plack, RN, BSN CIR continuing to follow for possible admission pending bed availability.  Hopeful for bed tomorrow.  Will follow progress.    Reinaldo Raddle, RN, BSN  Trauma/Neuro ICU Case Manager [phone]

## 2016-08-31 ENCOUNTER — Inpatient Hospital Stay (HOSPITAL_COMMUNITY)
Admission: RE | Admit: 2016-08-31 | Discharge: 2016-09-16 | DRG: 559 | Disposition: A | Discharge status: Home Health | Payer: Medicare Other | Source: Intra-hospital | Attending: Physical Medicine & Rehabilitation | Admitting: Physical Medicine & Rehabilitation

## 2016-08-31 ENCOUNTER — Encounter: Payer: Self-pay | Admitting: *Deleted

## 2016-08-31 DIAGNOSIS — Z96652 Presence of left artificial knee joint: Secondary | ICD-10-CM | POA: Diagnosis present

## 2016-08-31 DIAGNOSIS — Z7982 Long term (current) use of aspirin: Secondary | ICD-10-CM | POA: Diagnosis not present

## 2016-08-31 DIAGNOSIS — Z7902 Long term (current) use of antithrombotics/antiplatelets: Secondary | ICD-10-CM

## 2016-08-31 DIAGNOSIS — C779 Secondary and unspecified malignant neoplasm of lymph node, unspecified: Secondary | ICD-10-CM | POA: Diagnosis present

## 2016-08-31 DIAGNOSIS — I129 Hypertensive chronic kidney disease with stage 1 through stage 4 chronic kidney disease, or unspecified chronic kidney disease: Secondary | ICD-10-CM | POA: Diagnosis present

## 2016-08-31 DIAGNOSIS — I82439 Acute embolism and thrombosis of unspecified popliteal vein: Secondary | ICD-10-CM | POA: Diagnosis present

## 2016-08-31 DIAGNOSIS — J90 Pleural effusion, not elsewhere classified: Secondary | ICD-10-CM | POA: Diagnosis not present

## 2016-08-31 DIAGNOSIS — E8770 Fluid overload, unspecified: Secondary | ICD-10-CM | POA: Diagnosis present

## 2016-08-31 DIAGNOSIS — G9389 Other specified disorders of brain: Secondary | ICD-10-CM

## 2016-08-31 DIAGNOSIS — G939 Disorder of brain, unspecified: Secondary | ICD-10-CM

## 2016-08-31 DIAGNOSIS — R918 Other nonspecific abnormal finding of lung field: Secondary | ICD-10-CM

## 2016-08-31 DIAGNOSIS — G4700 Insomnia, unspecified: Secondary | ICD-10-CM | POA: Diagnosis present

## 2016-08-31 DIAGNOSIS — E039 Hypothyroidism, unspecified: Secondary | ICD-10-CM | POA: Diagnosis present

## 2016-08-31 DIAGNOSIS — J189 Pneumonia, unspecified organism: Secondary | ICD-10-CM | POA: Diagnosis present

## 2016-08-31 DIAGNOSIS — Z01818 Encounter for other preprocedural examination: Secondary | ICD-10-CM

## 2016-08-31 DIAGNOSIS — N189 Chronic kidney disease, unspecified: Secondary | ICD-10-CM | POA: Diagnosis present

## 2016-08-31 DIAGNOSIS — K219 Gastro-esophageal reflux disease without esophagitis: Secondary | ICD-10-CM | POA: Diagnosis present

## 2016-08-31 DIAGNOSIS — R5381 Other malaise: Secondary | ICD-10-CM

## 2016-08-31 DIAGNOSIS — W11XXXD Fall on and from ladder, subsequent encounter: Secondary | ICD-10-CM | POA: Diagnosis present

## 2016-08-31 DIAGNOSIS — R410 Disorientation, unspecified: Secondary | ICD-10-CM

## 2016-08-31 DIAGNOSIS — I480 Paroxysmal atrial fibrillation: Secondary | ICD-10-CM

## 2016-08-31 DIAGNOSIS — Z955 Presence of coronary angioplasty implant and graft: Secondary | ICD-10-CM | POA: Diagnosis not present

## 2016-08-31 DIAGNOSIS — I4891 Unspecified atrial fibrillation: Secondary | ICD-10-CM | POA: Diagnosis present

## 2016-08-31 DIAGNOSIS — D62 Acute posthemorrhagic anemia: Secondary | ICD-10-CM | POA: Diagnosis present

## 2016-08-31 DIAGNOSIS — S32810S Multiple fractures of pelvis with stable disruption of pelvic ring, sequela: Secondary | ICD-10-CM

## 2016-08-31 DIAGNOSIS — C342 Malignant neoplasm of middle lobe, bronchus or lung: Secondary | ICD-10-CM | POA: Diagnosis not present

## 2016-08-31 DIAGNOSIS — I739 Peripheral vascular disease, unspecified: Secondary | ICD-10-CM | POA: Diagnosis present

## 2016-08-31 DIAGNOSIS — G936 Cerebral edema: Secondary | ICD-10-CM | POA: Diagnosis present

## 2016-08-31 DIAGNOSIS — R32 Unspecified urinary incontinence: Secondary | ICD-10-CM | POA: Diagnosis not present

## 2016-08-31 DIAGNOSIS — I82403 Acute embolism and thrombosis of unspecified deep veins of lower extremity, bilateral: Secondary | ICD-10-CM | POA: Diagnosis present

## 2016-08-31 DIAGNOSIS — I10 Essential (primary) hypertension: Secondary | ICD-10-CM

## 2016-08-31 DIAGNOSIS — Z8249 Family history of ischemic heart disease and other diseases of the circulatory system: Secondary | ICD-10-CM

## 2016-08-31 DIAGNOSIS — S32810D Multiple fractures of pelvis with stable disruption of pelvic ring, subsequent encounter for fracture with routine healing: Secondary | ICD-10-CM | POA: Diagnosis present

## 2016-08-31 DIAGNOSIS — Y95 Nosocomial condition: Secondary | ICD-10-CM | POA: Diagnosis present

## 2016-08-31 DIAGNOSIS — I82433 Acute embolism and thrombosis of popliteal vein, bilateral: Secondary | ICD-10-CM

## 2016-08-31 DIAGNOSIS — Z888 Allergy status to other drugs, medicaments and biological substances status: Secondary | ICD-10-CM

## 2016-08-31 DIAGNOSIS — C801 Malignant (primary) neoplasm, unspecified: Secondary | ICD-10-CM | POA: Diagnosis not present

## 2016-08-31 DIAGNOSIS — R609 Edema, unspecified: Secondary | ICD-10-CM

## 2016-08-31 DIAGNOSIS — M810 Age-related osteoporosis without current pathological fracture: Secondary | ICD-10-CM | POA: Diagnosis present

## 2016-08-31 DIAGNOSIS — E785 Hyperlipidemia, unspecified: Secondary | ICD-10-CM

## 2016-08-31 DIAGNOSIS — K5901 Slow transit constipation: Secondary | ICD-10-CM

## 2016-08-31 DIAGNOSIS — E871 Hypo-osmolality and hyponatremia: Secondary | ICD-10-CM | POA: Diagnosis present

## 2016-08-31 DIAGNOSIS — N5089 Other specified disorders of the male genital organs: Secondary | ICD-10-CM | POA: Diagnosis present

## 2016-08-31 DIAGNOSIS — Z87891 Personal history of nicotine dependence: Secondary | ICD-10-CM

## 2016-08-31 DIAGNOSIS — R590 Localized enlarged lymph nodes: Secondary | ICD-10-CM | POA: Diagnosis not present

## 2016-08-31 DIAGNOSIS — R8290 Unspecified abnormal findings in urine: Secondary | ICD-10-CM | POA: Diagnosis not present

## 2016-08-31 DIAGNOSIS — N390 Urinary tract infection, site not specified: Secondary | ICD-10-CM | POA: Diagnosis not present

## 2016-08-31 DIAGNOSIS — I252 Old myocardial infarction: Secondary | ICD-10-CM

## 2016-08-31 DIAGNOSIS — M1711 Unilateral primary osteoarthritis, right knee: Secondary | ICD-10-CM | POA: Diagnosis present

## 2016-08-31 DIAGNOSIS — C7931 Secondary malignant neoplasm of brain: Secondary | ICD-10-CM | POA: Diagnosis present

## 2016-08-31 DIAGNOSIS — I2510 Atherosclerotic heart disease of native coronary artery without angina pectoris: Secondary | ICD-10-CM | POA: Diagnosis present

## 2016-08-31 DIAGNOSIS — G479 Sleep disorder, unspecified: Secondary | ICD-10-CM | POA: Diagnosis not present

## 2016-08-31 DIAGNOSIS — C3490 Malignant neoplasm of unspecified part of unspecified bronchus or lung: Secondary | ICD-10-CM | POA: Diagnosis present

## 2016-08-31 DIAGNOSIS — R0602 Shortness of breath: Secondary | ICD-10-CM

## 2016-08-31 DIAGNOSIS — G8918 Other acute postprocedural pain: Secondary | ICD-10-CM

## 2016-08-31 DIAGNOSIS — I25810 Atherosclerosis of coronary artery bypass graft(s) without angina pectoris: Secondary | ICD-10-CM

## 2016-08-31 DIAGNOSIS — Z951 Presence of aortocoronary bypass graft: Secondary | ICD-10-CM

## 2016-08-31 DIAGNOSIS — R310 Gross hematuria: Secondary | ICD-10-CM | POA: Diagnosis not present

## 2016-08-31 DIAGNOSIS — R600 Localized edema: Secondary | ICD-10-CM

## 2016-08-31 DIAGNOSIS — D72829 Elevated white blood cell count, unspecified: Secondary | ICD-10-CM

## 2016-08-31 DIAGNOSIS — I6200 Nontraumatic subdural hemorrhage, unspecified: Secondary | ICD-10-CM | POA: Diagnosis not present

## 2016-08-31 DIAGNOSIS — E7800 Pure hypercholesterolemia, unspecified: Secondary | ICD-10-CM | POA: Diagnosis present

## 2016-08-31 DIAGNOSIS — Z79899 Other long term (current) drug therapy: Secondary | ICD-10-CM

## 2016-08-31 DIAGNOSIS — R339 Retention of urine, unspecified: Secondary | ICD-10-CM

## 2016-08-31 DIAGNOSIS — S32810A Multiple fractures of pelvis with stable disruption of pelvic ring, initial encounter for closed fracture: Secondary | ICD-10-CM | POA: Diagnosis present

## 2016-08-31 DIAGNOSIS — K5903 Drug induced constipation: Secondary | ICD-10-CM

## 2016-08-31 DIAGNOSIS — Z5181 Encounter for therapeutic drug level monitoring: Secondary | ICD-10-CM | POA: Diagnosis not present

## 2016-08-31 DIAGNOSIS — E46 Unspecified protein-calorie malnutrition: Secondary | ICD-10-CM | POA: Diagnosis present

## 2016-08-31 DIAGNOSIS — E8809 Other disorders of plasma-protein metabolism, not elsewhere classified: Secondary | ICD-10-CM

## 2016-08-31 DIAGNOSIS — C771 Secondary and unspecified malignant neoplasm of intrathoracic lymph nodes: Secondary | ICD-10-CM | POA: Diagnosis not present

## 2016-08-31 DIAGNOSIS — Z6829 Body mass index (BMI) 29.0-29.9, adult: Secondary | ICD-10-CM

## 2016-08-31 DIAGNOSIS — M7989 Other specified soft tissue disorders: Secondary | ICD-10-CM | POA: Diagnosis not present

## 2016-08-31 DIAGNOSIS — E876 Hypokalemia: Secondary | ICD-10-CM

## 2016-08-31 DIAGNOSIS — R911 Solitary pulmonary nodule: Secondary | ICD-10-CM | POA: Diagnosis not present

## 2016-08-31 LAB — CBC
HCT: 27.1 % — ABNORMAL LOW (ref 39.0–52.0)
Hemoglobin: 9.4 g/dL — ABNORMAL LOW (ref 13.0–17.0)
MCH: 30.3 pg (ref 26.0–34.0)
MCHC: 34.7 g/dL (ref 30.0–36.0)
MCV: 87.4 fL (ref 78.0–100.0)
PLATELETS: 156 10*3/uL (ref 150–400)
RBC: 3.1 MIL/uL — AB (ref 4.22–5.81)
RDW: 15.4 % (ref 11.5–15.5)
WBC: 7.2 10*3/uL (ref 4.0–10.5)

## 2016-08-31 MED ORDER — OXYCODONE HCL 5 MG PO TABS
5.0000 mg | ORAL_TABLET | Freq: Four times a day (QID) | ORAL | Status: DC | PRN
  Administered 2016-08-31 – 2016-09-01 (×2): 5 mg via ORAL
  Administered 2016-09-01 – 2016-09-02 (×4): 10 mg via ORAL
  Administered 2016-09-03 – 2016-09-07 (×7): 5 mg via ORAL
  Administered 2016-09-08: 10 mg via ORAL
  Filled 2016-08-31 (×3): qty 2
  Filled 2016-08-31 (×4): qty 1
  Filled 2016-08-31 (×2): qty 2
  Filled 2016-08-31: qty 1
  Filled 2016-08-31 (×2): qty 2
  Filled 2016-08-31 (×2): qty 1
  Filled 2016-08-31 (×2): qty 2
  Filled 2016-08-31: qty 1
  Filled 2016-08-31: qty 2
  Filled 2016-08-31: qty 1

## 2016-08-31 MED ORDER — ROSUVASTATIN CALCIUM 20 MG PO TABS
20.0000 mg | ORAL_TABLET | Freq: Every day | ORAL | Status: DC
  Administered 2016-09-01 – 2016-09-15 (×14): 20 mg via ORAL
  Filled 2016-08-31 (×15): qty 1

## 2016-08-31 MED ORDER — OXYCODONE-ACETAMINOPHEN 5-325 MG PO TABS
1.0000 | ORAL_TABLET | Freq: Four times a day (QID) | ORAL | Status: DC | PRN
  Administered 2016-09-01: 1 via ORAL
  Administered 2016-09-01: 2 via ORAL
  Administered 2016-09-01 – 2016-09-02 (×2): 1 via ORAL
  Administered 2016-09-02: 2 via ORAL
  Administered 2016-09-04 – 2016-09-07 (×3): 1 via ORAL
  Filled 2016-08-31 (×2): qty 1
  Filled 2016-08-31 (×2): qty 2
  Filled 2016-08-31 (×4): qty 1
  Filled 2016-08-31 (×2): qty 2

## 2016-08-31 MED ORDER — CLOPIDOGREL BISULFATE 75 MG PO TABS
75.0000 mg | ORAL_TABLET | Freq: Every day | ORAL | Status: DC
  Administered 2016-09-01: 75 mg via ORAL
  Filled 2016-08-31: qty 1

## 2016-08-31 MED ORDER — TAMSULOSIN HCL 0.4 MG PO CAPS
0.4000 mg | ORAL_CAPSULE | Freq: Every day | ORAL | Status: DC
  Administered 2016-09-01 – 2016-09-13 (×13): 0.4 mg via ORAL
  Filled 2016-08-31 (×13): qty 1

## 2016-08-31 MED ORDER — BISACODYL 10 MG RE SUPP
10.0000 mg | Freq: Every day | RECTAL | Status: DC | PRN
  Administered 2016-09-03 – 2016-09-05 (×2): 10 mg via RECTAL
  Filled 2016-08-31 (×2): qty 1

## 2016-08-31 MED ORDER — ONDANSETRON HCL 4 MG PO TABS: 4.0000 mg | ORAL_TABLET | Freq: Four times a day (QID) | ORAL | Status: DC | PRN

## 2016-08-31 MED ORDER — ACETAMINOPHEN 325 MG PO TABS
650.0000 mg | ORAL_TABLET | Freq: Four times a day (QID) | ORAL | Status: DC | PRN
  Filled 2016-08-31: qty 2

## 2016-08-31 MED ORDER — LEVOTHYROXINE SODIUM 112 MCG PO TABS
112.0000 ug | ORAL_TABLET | Freq: Every day | ORAL | Status: DC
  Administered 2016-09-01 – 2016-09-16 (×15): 112 ug via ORAL
  Filled 2016-08-31 (×16): qty 1

## 2016-08-31 MED ORDER — NITROGLYCERIN 0.4 MG SL SUBL: 0.4000 mg | SUBLINGUAL_TABLET | SUBLINGUAL | Status: DC | PRN

## 2016-08-31 MED ORDER — ENOXAPARIN SODIUM 40 MG/0.4ML ~~LOC~~ SOLN
40.0000 mg | SUBCUTANEOUS | Status: DC
  Administered 2016-09-01: 40 mg via SUBCUTANEOUS
  Filled 2016-08-31: qty 0.4

## 2016-08-31 MED ORDER — TRIAMTERENE-HCTZ 37.5-25 MG PO TABS
1.0000 | ORAL_TABLET | Freq: Every day | ORAL | Status: DC
  Administered 2016-09-01 – 2016-09-16 (×15): 1 via ORAL
  Filled 2016-08-31 (×19): qty 1

## 2016-08-31 MED ORDER — SORBITOL 70 % SOLN
30.0000 mL | Freq: Every day | Status: DC | PRN
  Administered 2016-09-03 – 2016-09-06 (×2): 30 mL via ORAL
  Filled 2016-08-31 (×3): qty 30

## 2016-08-31 MED ORDER — DOCUSATE SODIUM 100 MG PO CAPS
100.0000 mg | ORAL_CAPSULE | Freq: Two times a day (BID) | ORAL | Status: DC
  Administered 2016-08-31 – 2016-09-05 (×9): 100 mg via ORAL
  Filled 2016-08-31 (×9): qty 1

## 2016-08-31 MED ORDER — METOPROLOL TARTRATE 50 MG PO TABS
50.0000 mg | ORAL_TABLET | Freq: Two times a day (BID) | ORAL | Status: DC
  Administered 2016-09-01 – 2016-09-02 (×3): 50 mg via ORAL
  Filled 2016-08-31 (×4): qty 1

## 2016-08-31 MED ORDER — ONDANSETRON HCL 4 MG/2ML IJ SOLN: 4.0000 mg | Freq: Four times a day (QID) | INTRAMUSCULAR | Status: DC | PRN

## 2016-08-31 MED ORDER — PANTOPRAZOLE SODIUM 40 MG PO TBEC
40.0000 mg | DELAYED_RELEASE_TABLET | Freq: Every day | ORAL | Status: DC
  Administered 2016-09-01 – 2016-09-16 (×16): 40 mg via ORAL
  Filled 2016-08-31 (×16): qty 1

## 2016-08-31 MED ORDER — SENNA 8.6 MG PO TABS
1.0000 | ORAL_TABLET | Freq: Every day | ORAL | Status: DC
  Administered 2016-09-01 – 2016-09-05 (×5): 8.6 mg via ORAL
  Filled 2016-08-31 (×5): qty 1

## 2016-08-31 NOTE — Progress Notes (Signed)
[facility] Progress Note  6 Days Post-Op  Subjective: Sitting in bed, wife at bedside. denies pain. Tolerating PO. Foley in place. +flatus. Last [facility] Sunday 3/11.  Objective: Vital signs in last 24 hours: Temp:  [98.1 F (36.7 C)-98.6 F (37 C)] 98.6 F (37 C) (03/13 2113) Pulse Rate:  [78-79] 78 (03/13 2113) Resp:  [18] 18 (03/13 2113) BP: (132-140)/(57-66) 140/57 (03/13 2113) SpO2:  [95 %-98 %] 95 % (03/13 2113) Last BM Date: 08/29/16  Intake/Output from previous day: 03/13 0701 - 03/14 0700 In: 840 [P.O.:840] Out: 3200 [Urine:3200] Intake/Output this shift: No intake/output data recorded.  PE: Gen: Alert, NAD, pleasant and cooperative Card: RRR Pulm: Non-labored, CTAB Abd: Soft non-tender, non-distended, +BS, reducible umbilical hernia. GU: foley in place, significant scrotal swelling Ext: Bilateral lower extremity edema present Neuro: oriented to person, place, time.   Lab Results:   Recent Labs  08/30/16 0724 08/31/16 0714  WBC 8.2 7.2  HGB 9.2* 9.4*  HCT 26.7* 27.1*  PLT 141* 156   BMET  Recent Labs  08/28/16 1135  NA 134*  K 3.3*  CL 105  CO2 22  GLUCOSE 139*  BUN 14  CREATININE 0.64  CALCIUM 7.9*   CMP     Component Value Date/Time   NA 134 (L) 08/28/2016 1135   NA 142 03/26/2016 0801   K 3.3 (L) 08/28/2016 1135   CL 105 08/28/2016 1135   CO2 22 08/28/2016 1135   GLUCOSE 139 (H) 08/28/2016 1135   BUN 14 08/28/2016 1135   BUN 16 03/26/2016 0801   CREATININE 0.64 08/28/2016 1135   CREATININE 0.98 06/28/2014 0819   CALCIUM 7.9 (L) 08/28/2016 1135   PROT 5.3 (L) 08/23/2016 0333   PROT 6.4 03/26/2016 0801   ALBUMIN 3.1 (L) 08/23/2016 0333   ALBUMIN 4.3 03/26/2016 0801   AST 30 08/23/2016 0333   ALT 22 08/23/2016 0333   ALKPHOS 59 08/23/2016 0333   BILITOT 1.2 08/23/2016 0333   BILITOT 0.7 03/26/2016 0801   GFRNONAA >60 08/28/2016 1135   GFRAA >60 08/28/2016 1135   Lipase     Component Value Date/Time   LIPASE  33 07/20/2007 1843   Anti-infectives: Anti-infectives    Start     Dose/Rate Route Frequency Ordered Stop   08/25/16 1600  ceFAZolin (ANCEF) IVPB 2g/100 mL premix     2 g 200 mL/hr over 30 Minutes Intravenous Every 8 hours 08/25/16 1155 08/26/16 0821   08/24/16 1045  ceFAZolin (ANCEF) IVPB 2g/100 mL premix     2 g 200 mL/hr over 30 Minutes Intravenous To Surgery 08/24/16 1042 08/25/16 0830     Assessment/Plan Fall from ladder Open book pelvic FX with L SI disruption- S/P SI screw and anterior plate by Dr. Marcelino Scot 3/8, NWB LLE, WBAT RLE for transfers only. ROM as tolerated. Dressing changes/ice PRN. ABL anemia-had good response to transfusion. Hgb/hct stable Thrombocytopenia- resolved. Platelets 156 today  RML mass/brain mass- likely lung CA with met, will need further eval as outpatient.  PAD- plavix re-started 08/30/16; CBC stable   FEN- tolerating Regular diet   VTE- PAS, Lovenox  Dispo - continue foley 2/2 scrotal swelling , discharge to CIR today.  D/c summary to follow    LOS: 9 days    Jill Alexanders , [facility] 08/31/2016, 10:43 AM Pager: [phone] Consults: [phone] Mon-Fri 7:00 am-4:30 pm Sat-Sun 7:00 am-11:30 am

## 2016-08-31 NOTE — Progress Notes (Signed)
Patient and family were informed about  rehahab process including patient safety plan and rehab booklet.[name] [name]

## 2016-08-31 NOTE — H&P (Signed)
Physical Medicine and Rehabilitation Admission H&P    Chief Complaint  Patient presents with  . Fall  : HPI: [name] [name] is a 76 y.o. male with history of PVD with multiple revascularization procedures, left carotid surgery, CAD status post CABG, atrial fibrillation maintained on aspirin and Plavix. History taken from chart review, patient, and wife. Patient lives with spouse. Independent prior to admission. One level home with 2 steps to entry. Presented 08/22/2016 after a fall from a ladder after attempting to clean out his stove pipe. Denied loss of consciousness. Cranial CT scan reviewed, unremarkable for acute process but did show abnormal anterior left frontal lobe appearance did not resemble typical traumatic injury but instead suspicious for a 2 cm brain mass with mild surrounding vasogenic edema.. CT cervical spine negative. CT abdomen and pelvis showed disruption of the symphysis pubis and left sacroiliac joint with retroperitoneal hemorrhage in the left side of the pelvis and subcutaneous hemorrhage in the left buttocks as well as fractures left transverse process of L2-S1. Also with incidental findings of spiculated mass in the right middle lobe consistent with carcinoma of the lung with metastatic adenopathy in the subcarinal region. Several small pulmonary nodules which are too small to characterize. Underwent open reduction internal fixation of pubic symphysis with sacroiliac screw fixation of left SI joint 08/25/2016 per Dr. Marcelino Scot. Nonweightbearing left lower extremity. Weightbearing as tolerated right lower extremity for transfers only. Bed to chair only for 8 weeks. Hospital course pain management. Lung mass to be evaluated as an outpatient. Acute blood loss anemia 6.9 transfused late hemoglobin 9.2.Foley catheter tube remains in place for scrotal edema. Plan voiding trial. Subcutaneous Lovenox added for DVT prophylaxis 08/29/2016.Physical and occupational therapy evaluations  completed with recommendations of physical medicine rehabilitation consult. Patient was admitted for a comprehensive rehabilitation program  Review of Systems  Constitutional: Negative for chills and fever.  HENT: Negative for hearing loss and tinnitus.   Eyes: Negative for blurred vision and double vision.  Respiratory: Positive for cough and shortness of breath.   Cardiovascular: Positive for leg swelling. Negative for chest pain and palpitations.  Gastrointestinal: Positive for constipation. Negative for nausea and vomiting.       GERD  Genitourinary: Positive for urgency. Negative for dysuria and hematuria.  Musculoskeletal: Positive for falls.  Skin: Negative for rash.  Neurological: Positive for weakness and headaches. Negative for seizures.  All other systems reviewed and are negative.  Past Medical History:  Diagnosis Date  . Atrial fibrillation ([place])    no hx of reported at preop visit of 04/21/11   . CAD (coronary artery disease)   . Carotid artery occlusion    left carotid endarterectomy  . Chronic kidney disease    AAA repair with reimplant of renals   . CHRONIC OBSTRUCTIVE PULMONARY DISEASE    pt denied at visit of 04/21/11   . CORONARY ARTERY DISEASE   . Disorder of left sacroiliac joint 08/24/2016  . DIVERTICULOSIS OF COLON   . GERD   . H/O hiatal hernia   . Headache(784.0)    Hx: of years of years  . HYPERTENSION   . HYPOTHYROIDISM   . JOINT EFFUSION, KNEE   . KNEE, ARTHRITIS, DEGEN./OSTEO    right knee   . Myocardial infarction    1994   . NEPHROLITHIASIS, HX OF   . OSTEOPOROSIS   . Other dysphagia   . PERIPHERAL VASCULAR DISEASE    AAA - 1994 with reimplant of renals   .  Peripheral vascular disease ([place])    subclavian stenosis PTA - 3/08   . Pure hypercholesterolemia   . Renal artery stenosis [facility])    Past Surgical History:  Procedure Laterality Date  . ABDOMINAL AORTIC ANEURYSM REPAIR     with reimplantation of renals   . CARDIAC CATHETERIZATION      2008  . CAROTID ENDARTERECTOMY Left Jan. 30, 2015   CEA  . CHOLECYSTECTOMY  2000   Gall Bladder  . COLONOSCOPY W/ BIOPSIES AND POLYPECTOMY     Hx: of  . CORONARY ARTERY BYPASS GRAFT     1995  . CORONARY STENT PLACEMENT     Hx: of  . ENDARTERECTOMY Left 07/19/2013   Procedure: ENDARTERECTOMY CAROTID;  Surgeon: Mal Misty, MD;  Location: [place];  Service: Vascular;  Laterality: Left;  . GALLBLADDER SURGERY  2004   . JOINT REPLACEMENT     partial knee replacement on left 2002   . KNEE SURGERY    . ORIF PELVIC FRACTURE Left 08/25/2016   Procedure: OPEN REDUCTION INTERNAL FIXATION (ORIF) PELVIC FRACTURE; plate on front, SI screw on the back;  Surgeon: Altamese Greenwood, MD;  Location: [place];  Service: Orthopedics;  Laterality: Left;  . OTHER SURGICAL HISTORY     left subclavian stenosis surgery PTA 08/2006   . OTHER SURGICAL HISTORY     carotid surgery on right 2004   . TOTAL KNEE ARTHROPLASTY  04/29/2011   Procedure: TOTAL KNEE ARTHROPLASTY;  Surgeon: Gearlean Alf;  Location: [facility];  Service: Orthopedics;  Laterality: Right;  [name] VASCULAR SURGERY     AAA  . VASECTOMY  1973   Family History  Problem Relation Age of Onset  . Hypertension Mother   . Heart disease Father   . Heart attack Father   . Cancer Brother     Lung  . Diabetes Sister    Social History:  reports that he quit smoking about 24 years ago. His smoking use included Cigarettes. He has never used smokeless tobacco. He reports that he does not drink alcohol or use drugs. Allergies:  Allergies  Allergen Reactions  . Imitrex [Sumatriptan] Nausea And Vomiting and Other (See Comments)    "made me like I was having a stoke"  . Lipitor [Atorvastatin] Nausea And Vomiting and Other (See Comments)    INTOLERANCE > MYALGIAS "couldn't get out of the bed ( pt had to crawl out of bed ) I hurt so bad"   Medications Prior to Admission  Medication Sig Dispense Refill  . acetaminophen (TYLENOL) 325 MG tablet Take 325-650 mg by  mouth every 6 (six) hours as needed (for pain).    [name] aspirin EC 81 MG tablet Take 1 tablet (81 mg total) by mouth daily. 30 tablet 5  . clopidogrel (PLAVIX) 75 MG tablet TAKE ONE (1) TABLET EACH DAY 30 tablet 5  . Coenzyme Q10 (CO Q-10 PO) Take 1 capsule by mouth daily.    . lansoprazole (PREVACID) 15 MG capsule Take 1 capsule (15 mg total) by mouth daily. 30 capsule 5  . levothyroxine (SYNTHROID, LEVOTHROID) 112 MCG tablet Take 1 tablet (112 mcg total) by mouth daily. 30 tablet 5  . metoprolol (LOPRESSOR) 50 MG tablet Take 1 tablet (50 mg total) by mouth 2 (two) times daily. 60 tablet 5  . niacin (SLO-NIACIN) 500 MG tablet Take 1,500 mg by mouth at bedtime.    . nitroGLYCERIN (NITROSTAT) 0.4 MG SL tablet Place 1 tablet (0.4 mg total) under the tongue  every 5 (five) minutes as needed for chest pain. 25 tablet 10  . rosuvastatin (CRESTOR) 20 MG tablet Take 1 tablet (20 mg total) by mouth daily. 30 tablet 5  . tamsulosin (FLOMAX) 0.4 MG CAPS capsule Take 1 capsule (0.4 mg total) by mouth daily. 30 capsule 5  . triamterene-hydrochlorothiazide (MAXZIDE-25) 37.5-25 MG tablet Take 1 tablet by mouth every morning. 30 tablet 5    Home: Home Living Family/patient expects to be discharged to:: Inpatient rehab Living Arrangements: Spouse/significant other Available Help at Discharge: Family, Available 24 hours/day Type of Home: House Home Access: Stairs to enter [employer] of Steps: 2 through one way and 6 through other; plan to put ramp where the 2 steps are Entrance Stairs-Rails: Right Home Layout: One level Bathroom Shower/Tub: Chiropodist: Standard Home Equipment: Environmental consultant - 2 wheels, Bedside commode, Transport chair   Functional History: Prior Function Level of Independence: Independent Comments: Drives, cooks, cleans.   Functional Status:  Mobility: Bed Mobility Overal bed mobility: Needs Assistance Bed Mobility: Supine to Sit, Sit to Supine Supine to  sit: Max assist General bed mobility comments: Pt performed slow helicopter pivot to edge of bed.  PTA assisted B LEs to edge of bed and assisted patient with trunk elevation.  Pt with VCs to keep legs close and avoid excessive hip abduction.   Transfers Overall transfer level: Needs assistance Equipment used: None Transfers: Squat Pivot Transfers Sit to Stand: +2 physical assistance, Max assist Stand pivot transfers: +2 physical assistance, Max assist Squat pivot transfers: Mod assist, +2 physical assistance General transfer comment: Pt performed squat pivot to the R with emphasis on weight bearing restriction.  PTA blocked R knee and NT assisted with L side.  Pt required decreased assist with this method.  He remains to present with strength deficit in LUE which limits his ability to use RW.  Pt performed on RA with O2 sats 91%.   Ambulation/Gait Ambulation/Gait assistance:  (Pt is transfers only for 8 weeks.  )    ADL: ADL Overall ADL's : Needs assistance/impaired Grooming: Wash/dry face, Supervision/safety, Sitting Upper Body Bathing: Minimal assistance, Sitting Lower Body Bathing: Total assistance Lower Body Bathing Details (indicate cue type and reason): pt with incontinence of stool and lack of awareness Upper Body Dressing : Moderate assistance Lower Body Dressing: Maximal assistance, Sitting/lateral leans Toilet Transfer: +2 for physical assistance, +2 for safety/equipment, Maximal assistance Toilet Transfer Details (indicate cue type and reason): pt needed cues to maintain L LE NWB. pt requires (A) to shift weight. Pt sitting prematurely and states "i am going to fall"  Functional mobility during ADLs: Moderate assistance, +2 for physical assistance General ADL Comments: Pt transfered EOB to chair with R transfer. Pt requires RW and max cues. pt complete sit<>Stand from chair to help with positioning of towel for scrotal edema.   Cognition: Cognition Overall Cognitive Status:  Impaired/Different from baseline Orientation Level: Oriented to person, Oriented to place Cognition Arousal/Alertness: Awake/alert Behavior During Therapy: WFL for tasks assessed/performed Overall Cognitive Status: Impaired/Different from baseline Area of Impairment: Safety/judgement Current Attention Level: Sustained Memory: Decreased recall of precautions, Decreased short-term memory Following Commands: Follows one step commands consistently Safety/Judgement: Decreased awareness of safety, Decreased awareness of deficits Awareness: Emergent Problem Solving: Slow processing, Decreased initiation, Difficulty sequencing General Comments: Wife does report that patient wakes up and states he is going to get OOB.  This behavior however was not observed.  Pt aware he cannot get up without assistance and he is  able to verbalize weight bearing restriction.    Physical Exam: Blood pressure (!) 140/57, pulse 78, temperature 98.6 F (37 C), temperature source Oral, resp. rate 18, height '5\' 6"'$  (1.676 m), weight 71.7 kg (158 lb), SpO2 95 %. Physical Exam  Vitals reviewed. Constitutional: He appears well-developed and well-nourished.  HENT:  Head: Normocephalic and atraumatic.  Eyes: Conjunctivae and EOM are normal. Left eye exhibits no discharge.  Neck: Normal range of motion. Neck supple. No tracheal deviation present. No thyromegaly present.  Cardiovascular: Normal rate and regular rhythm.   Respiratory: Effort normal.  Decreased breath sounds at the bases but clear to auscultation  GI: Soft. Bowel sounds are normal. He exhibits no distension.  Musculoskeletal: He exhibits edema and tenderness.  Neurological: He is alert.  HOH A&Ox2 with cues, confused Sensation intact to light touch DTRs symmetric Motor: RUE 4+/5 proximal to distal LUE: 4/5 proximal to distal RLE: 3-/5 proximal to distal LLE: 3/5 HF, 3+/5 KE, 4-/5 ADF/PF  Skin: Skin is warm and dry.  Dressing to ORIF c/d/i    Psychiatric: His mood appears anxious. He is slowed.    Results for orders placed or performed during the hospital encounter of 08/22/16 (from the past 48 hour(s))  CBC     Status: Abnormal   Collection Time: 08/30/16  7:24 AM  Result Value Ref Range   WBC 8.2 4.0 - 10.5 K/uL   RBC 3.03 (L) 4.22 - 5.81 MIL/uL   Hemoglobin 9.2 (L) 13.0 - 17.0 g/dL   HCT 26.7 (L) 39.0 - 52.0 %   MCV 88.1 78.0 - 100.0 fL   MCH 30.4 26.0 - 34.0 pg   MCHC 34.5 30.0 - 36.0 g/dL   RDW 15.5 11.5 - 15.5 %   Platelets 141 (L) 150 - 400 K/uL   No results found.     Medical Problem List and Plan: 1.  Decreased functional mobility secondary to Closed pelvic ring fracture with disorder of left sacroiliac joint with retroperitoneal hemorrhage in the left side of the pelvis with subcutaneous hemorrhage in the left buttocks after fall status post ORIF with sacroiliac screw fixation of left SI joint 08/25/2016. Weightbearing as tolerated right lower extremity for transfers only nonweightbearing left lower extremity 2.  DVT Prophylaxis/Anticoagulation: Subcutaneous Lovenox initiated 08/29/2016. Check vascular study. Monitor for any bleeding episodes 3. Pain Management: Oxycodone and Percocet as needed 4. Mood: Provide emotional support 5. Neuropsych: This patient is not capable of making decisions on his own behalf. 6. Skin/Wound Care: Routine skin checks 7. Fluids/Electrolytes/Nutrition: Routine I&O with follow-up chemistries 8. Acute blood loss anemia. Follow-up CBC 9. Hypertension. Lopressor 50 mg twice a day, Maxzide 37.5.-25 milligrams daily 10. CAD status post CABG. No chest pain or shortness of breath. Plavix resume. Discussed when to resume aspirin 11. Atrial fibrillation. Cardiac rate controlled. Continue Lopressor. 12. PVD with multiple revascularization procedures. Continue Plavix. 13. Right middle lobe lung/brain mass. Plan further evaluation as outpatient. 14. Scrotal edema. Plan remove Foley to check  voiding trial. Continue Flomax 15. Hypothyroidism. Synthroid 16. Hyperlipidemia. Crestor 17. Constipation. Laxative assistance  Post Admission Physician Evaluation: Preadmission assessment reviewed and changes made below. 1. Functional deficits secondary  to closed pelvic ring fracture with retroperitoneal hemorrhage in the left pelvis s/p ORIF of SI after fall. 2. Patient is admitted to receive collaborative, interdisciplinary care between the physiatrist, rehab nursing staff, and therapy team. 3. Patient's level of medical complexity and substantial therapy needs in context of that medical necessity cannot be provided at  a lesser intensity of care such as a SNF. 4. Patient has experienced substantial functional loss from his/her baseline which was documented above under the "Functional History" and "Functional Status" headings.  Judging by the patient's diagnosis, physical exam, and functional history, the patient has potential for functional progress which will result in measurable gains while on inpatient rehab.  These gains will be of substantial and practical use upon discharge  in facilitating mobility and self-care at the household level. 5. Physiatrist will provide 24 hour management of medical needs as well as oversight of the therapy plan/treatment and provide guidance as appropriate regarding the interaction of the two. 6. The Preadmission Screening has been reviewed and patient status is unchanged unless otherwise stated above. 7. 24 hour rehab nursing will assist with bladder management, safety, skin/wound care, disease management, pain management and patient education  and help integrate therapy concepts, techniques,education, etc. 8. PT will assess and treat for/with: Lower extremity strength, range of motion, stamina, balance, functional mobility, safety, adaptive techniques and equipment, woundcare, coping skills, pain control, education.   Goals are: Min A. 9. OT will assess and  treat for/with: ADL's, functional mobility, safety, upper extremity strength, adaptive techniques and equipment, wound mgt, ego support, and community reintegration.   Goals are: Min A. Therapy may not proceed with showering this patient. 10. Case Management and Social Worker will assess and treat for psychological issues and discharge planning. 11. Team conference will be held weekly to assess progress toward goals and to determine barriers to discharge. 12. Patient will receive at least 3 hours of therapy per day at least 5 days per week. 13. ELOS: 14-17 days.       14. Prognosis:  excellent  Delice Lesch, MD, Mellody Drown Lauraine Rinne J., PA-C 08/31/2016

## 2016-08-31 NOTE — Progress Notes (Signed)
Physical Therapy Treatment Patient Details Name: [name] [name] MRN: [id number] DOB: [birth year] Today's Date: 08/31/2016    History of Present Illness  76 y/o male s/p ORIF pubic symphysis disruption and left SI screw fixation. Pt fell from 8-10 feet from a ladder onto back, resulting in Symphysis diastasis and left SI widening. Found to have Left pelvic extraperitoneal hemorrhage with a large hematoma PMH: PVD, MI, HTN, HLD, CAD, COPD, CKD, A-fib    PT Comments    Patient continues to require +2 assist for mobility. Current plan remains appropriate.   Follow Up Recommendations  CIR     Equipment Recommendations  Wheelchair (measurements PT);Wheelchair cushion (measurements PT)    Recommendations for Other Services Rehab consult;OT consult     Precautions / Restrictions Precautions Precautions: Fall Restrictions Weight Bearing Restrictions: Yes RLE Weight Bearing: Weight bearing as tolerated LLE Weight Bearing: Non weight bearing Other Position/Activity Restrictions: bed to chair only for 8 weeks; WBAT RLE for transfers only    Mobility  Bed Mobility Overal bed mobility: Needs Assistance Bed Mobility: Supine to Sit     Supine to sit: +2 for physical assistance;Max assist     General bed mobility comments: Pt performed slow helicopter pivot to edge of bed.  PTA assisted B LEs to edge of bed and assisted patient with trunk elevation.  Pt with VCs to keep legs close and avoid excessive hip abduction.    Transfers Overall transfer level: Needs assistance Equipment used: None Transfers: Squat Pivot Transfers Sit to Stand: +2 physical assistance;Max assist Stand pivot transfers: +2 physical assistance;Max assist       General transfer comment: PTA assisted to maintain L LE NWB during transfer; once in standing pt needed less assist for WB status but unable to manage RW without +2 assist for balance and weight shifting;  Pt needs pad use to help with hip extension. pt  requries (A) to rotate to the R and pivot on R foot.   Ambulation/Gait                 Stairs            Wheelchair Mobility    Modified Rankin (Stroke Patients Only)       Balance Overall balance assessment: Needs assistance   Sitting balance-Leahy Scale: Fair     Standing balance support: Bilateral upper extremity supported Standing balance-Leahy Scale: Poor                      Cognition Arousal/Alertness: Awake/alert Behavior During Therapy: WFL for tasks assessed/performed Overall Cognitive Status: Impaired/Different from baseline Area of Impairment: Safety/judgement;Memory     Memory: Decreased short-term memory         General Comments: pt needed max cues to stay task focused and complete correct exercises; pt likes to joke and needs redireciton at times. questions HOH also affecting responses    Exercises General Exercises - Lower Extremity Long Arc Quad: AROM;Left;10 reps Heel Slides: AAROM;Left;10 reps Hip ABduction/ADduction: AAROM;Left;10 reps    General Comments General comments (skin integrity, edema, etc.): family present      Pertinent Vitals/Pain Pain Assessment: Faces Faces Pain Scale: Hurts even more Pain Location: LLE hips Pain Descriptors / Indicators: Aching;Sore;Grimacing;Guarding Pain Intervention(s): Limited activity within patient's tolerance;Monitored during session;Premedicated before session;Repositioned    Home Living                      Prior Function  PT Goals (current goals can now be found in the care plan section) Acute Rehab PT Goals Patient Stated Goal: to get better Additional Goals Additional Goal #1: pt will demonstrate ability to safely propel w/c 100' and navigate corners with supervision Progress towards PT goals: Progressing toward goals    Frequency    Min 4X/week      PT Plan Current plan remains appropriate    Co-evaluation   Reason for Co-Treatment:  Complexity of the patient's impairments (multi-system involvement);Necessary to address cognition/behavior during functional activity;For patient/therapist safety;To address functional/ADL transfers   OT goals addressed during session: ADL's and self-care;Proper use of Adaptive equipment and DME;Strengthening/ROM     End of Session Equipment Utilized During Treatment: Gait belt Activity Tolerance: Patient tolerated treatment well Patient left: in chair;with call bell/phone within reach;with family/visitor present Nurse Communication: Mobility status PT Visit Diagnosis: Unsteadiness on feet (R26.81);Muscle weakness (generalized) (M62.81);Difficulty in walking, not elsewhere classified (R26.2)     Time: 1115-5208 PT Time Calculation (min) (ACUTE ONLY): 26 min  Charges:  $Therapeutic Activity: 8-22 mins                    G Codes:       Salina April, PTA Pager: [phone]   08/31/2016, 3:58 PM

## 2016-08-31 NOTE — PMR Pre-admission (Signed)
PMR Admission Coordinator Pre-Admission Assessment  Patient: [name] [name] is an 76 y.o., male MRN: [id number] DOB: 24-Jul-1940 Height: '5\' 6"'$  (167.6 cm) Weight: 71.7 kg (158 lb)              Insurance Information HMO: No   PPO:       PCP:       IPA:       80/20:       OTHER:   PRIMARY: Medicare A/B      Policy#:  [id number]      Subscriber:  [name] [name] CM Name:        Phone#:       Fax#:   Pre-Cert#:        Employer:  Retired Benefits:  Phone #:       Name: Checked in [place]. Date: 07/22/03     Deduct:  $1340      Out of Pocket Max:  none      Life Max: unlimited CIR: 100%      SNF: 100 days Outpatient: 80%     Co-Pay: 20% Home Health: 100%      Co-Pay: none DME: 80%     Co-Pay: 20% Providers: patient's choice  SECONDARY:  BCBS      Policy#: [id number]      Subscriber:  [name] [name] CM Name:        Phone#:       Fax#:   Pre-Cert#:        Employer:  Retired Benefits:  Phone #:  [phone]     Name:   Eff. Date:       Deduct:        Out of Pocket Max:        Life Max:   CIR:        SNF:   Outpatient:       Co-Pay:   Home Health:        Co-Pay:   DME:       Co-Pay:    Emergency Contact Information Contact Information    Name Relation Home Work Mobile   [name] [name] Spouse [phone]  [phone]   [name] [name], [name] [name]   [phone]   [name] [name], [name] [name] Daughter   [phone]     Current Medical History  Patient Admitting Diagnosis: Polytrauma   History of Present Illness: A 76 y.o.malewith history of PVD with multiple revascularization procedures, left carotid surgery, CAD status post CABG, atrial fibrillation maintained on aspirin and Plavix. History taken from chart review, patient, and wife. Patient lives with spouse. Independent prior to admission. One level home with 2 steps to entry. Presented 08/22/2016 after a fall from a ladder after attempting to clean out his stove pipe. Denied loss of consciousness. Cranial CT scan reviewed, unremarkable for  acute process but did show abnormal anterior left frontal lobe appearance did not resemble typical traumatic injury but instead suspicious for a 2 cm brain mass with mild surrounding vasogenic edema.. CT cervical spine negative. CT abdomen and pelvis showed disruption of the symphysis pubis and left sacroiliac joint with retroperitoneal hemorrhage in the left side of the pelvis and subcutaneous hemorrhage in the left buttocks as well as fractures left transverse process of L2-S1. Also with incidental findings of spiculated mass in the right middle lobe consistent with carcinoma of the lung with metastatic adenopathy in the subcarinalregion. Several small pulmonary nodules which are too small to characterize. Underwent open reduction internal fixation of pubic symphysis with sacroiliac screw fixation of left SI  joint 08/25/2016 per Dr. Marcelino [name]. Nonweightbearing left lower extremity. Weightbearing as tolerated right lower extremity for transfers only. Bed to chair only for 8 weeks. Hospital course pain management. Lung mass to be evaluated as an outpatient. Acute blood loss anemia 6.9 transfused late hemoglobin 9.2.Foley catheter tube remains in place for scrotal edema. Plan voiding trial. Subcutaneous Lovenox added for DVT prophylaxis 08/29/2016. Physical and occupational therapy evaluations completed with recommendations of physical medicine rehabilitation consult. Patient to be admitted for a comprehensive inpatient rehabilitation program.   Past Medical History  Past Medical History:  Diagnosis Date  . Atrial fibrillation ([place])    no hx of reported at preop visit of 04/21/11   . CAD (coronary artery disease)   . Carotid artery occlusion    left carotid endarterectomy  . Chronic kidney disease    AAA repair with reimplant of renals   . CHRONIC OBSTRUCTIVE PULMONARY DISEASE    pt denied at visit of 04/21/11   . CORONARY ARTERY DISEASE   . Disorder of left sacroiliac joint 08/24/2016  . DIVERTICULOSIS OF  COLON   . GERD   . H/O hiatal hernia   . Headache(784.0)    Hx: of years of years  . HYPERTENSION   . HYPOTHYROIDISM   . JOINT EFFUSION, KNEE   . KNEE, ARTHRITIS, DEGEN./OSTEO    right knee   . Myocardial infarction    1994   . NEPHROLITHIASIS, HX OF   . OSTEOPOROSIS   . Other dysphagia   . PERIPHERAL VASCULAR DISEASE    AAA - 1994 with reimplant of renals   . Peripheral vascular disease ([place])    subclavian stenosis PTA - 3/08   . Pure hypercholesterolemia   . Renal artery stenosis ([facility])     Family History  family history includes Cancer in his brother; Diabetes in his sister; Heart attack in his father; Heart disease in his father; Hypertension in his mother.  Prior Rehab/Hospitalizations: No previous rehab admissions.  Has the patient had major surgery during 100 days prior to admission? No  Current Medications   Current Facility-Administered Medications:  .  acetaminophen (TYLENOL) tablet 650 mg, 650 mg, Oral, Q6H PRN, [name] Klein, MD, 650 mg at 08/28/16 2106 .  bisacodyl (DULCOLAX) suppository 10 mg, 10 mg, Rectal, Daily PRN, [name] Klein, MD, 10 mg at 08/28/16 2311 .  clopidogrel (PLAVIX) tablet 75 mg, 75 mg, Oral, Daily, [name] Current Simaan, PA-C, 75 mg at 08/31/16 1012 .  docusate sodium (COLACE) capsule 100 mg, 100 mg, Oral, BID, [name] Klein, MD, 100 mg at 08/31/16 1013 .  enoxaparin (LOVENOX) injection 40 mg, 40 mg, Subcutaneous, Q24H, [name] Spinner, PA-C, 40 mg at 08/31/16 1209 .  HYDROmorphone (DILAUDID) injection 0.5-1 mg, 0.5-1 mg, Intravenous, Q2H PRN, [name] Klein, MD .  levothyroxine (SYNTHROID, LEVOTHROID) tablet 112 mcg, 112 mcg, Oral, QAC breakfast, [name] Klein, MD, 112 mcg at 08/31/16 0552 .  metoprolol (LOPRESSOR) tablet 50 mg, 50 mg, Oral, BID, [name] Klein, MD, 50 mg at 08/31/16 1013 .  nitroGLYCERIN (NITROSTAT) SL tablet 0.4 mg, 0.4 mg, Sublingual, Q5 min PRN, [name] Klein, MD .  ondansetron (ZOFRAN) injection 4 mg, 4 mg, Intravenous, Q6H PRN, [name] Klein, MD .  ondansetron (ZOFRAN) tablet 4 mg, 4 mg, Oral, Q6H PRN, [name] Klein, MD .  oxyCODONE (Oxy IR/ROXICODONE) immediate release tablet 5-10 mg, 5-10 mg, Oral, Q6H PRN, [name] Klein, MD .  oxyCODONE-acetaminophen (PERCOCET/ROXICET) 5-325 MG per tablet 1-2 tablet, 1-2 tablet, Oral, Q6H PRN, [name] Klein, MD, 2  tablet at 08/31/16 0552 .  pantoprazole (PROTONIX) EC tablet 40 mg, 40 mg, Oral, Daily, [name] Klein, MD, 40 mg at 08/31/16 1013 .  rosuvastatin (CRESTOR) tablet 20 mg, 20 mg, Oral, Daily, [name] Klein, MD, 20 mg at 08/31/16 1012 .  senna (SENOKOT) tablet 8.6 mg, 1 tablet, Oral, Daily, [name] Klein, MD, 8.6 mg at 08/31/16 1014 .  tamsulosin (FLOMAX) capsule 0.4 mg, 0.4 mg, Oral, Daily, [name] Klein, MD, 0.4 mg at 08/31/16 1014 .  triamterene-hydrochlorothiazide (MAXZIDE-25) 37.5-25 MG per tablet 1 tablet, 1 tablet, Oral, Daily, [name] Klein, MD, 1 tablet at 08/31/16 1012  Patients Current Diet: Diet Heart Room service appropriate? Yes; Fluid consistency: Thin  Precautions / Restrictions Precautions Precautions: Fall Restrictions Weight Bearing Restrictions: Yes RLE Weight Bearing: Weight bearing as tolerated LLE Weight Bearing: Non weight bearing Other Position/Activity Restrictions: bed to chair only for 8 weeks; WBAT RLE for transfers only   Has the patient had 2 or more falls or a fall with injury in the past year?No  Prior Activity Level Community (5-7x/wk): Went out daily.  Went to [employer] 5 x a week.  Was driving.  Home Assistive Devices / Equipment Home Assistive Devices/Equipment: None Home Equipment: Walker - 2 wheels, Bedside commode, Transport chair  Prior Device Use: Indicate devices/aids used by the patient prior to current illness, exacerbation or injury? None  Prior Functional Level Prior Function Level of Independence: Independent Comments: Drives, cooks, cleans.   Self Care: Did the patient need help bathing, dressing, using the toilet or eating?   Independent  Indoor Mobility: Did the patient need assistance with walking from room to room (with or without device)? Independent  Stairs: Did the patient need assistance with internal or external stairs (with or without device)? Independent  Functional Cognition: Did the patient need help planning regular tasks such as shopping or remembering to take medications? Independent  Current Functional Level Cognition  Overall Cognitive Status: Impaired/Different from baseline Current Attention Level: Sustained Orientation Level: Oriented to person, Oriented to place Following Commands: Follows one step commands consistently Safety/Judgement: Decreased awareness of safety, Decreased awareness of deficits General Comments: pt likes to joke and needs redireciton at times. questions HOH also affecting responses    Extremity Assessment (includes Sensation/Coordination)  Upper Extremity Assessment: Overall WFL for tasks assessed  Lower Extremity Assessment: Defer to PT evaluation RLE Deficits / Details: Able to perform LAQ. knee instability noted during standing. LLE Deficits / Details: Able to perform LAQ despite pain.    ADLs  Overall ADL's : Needs assistance/impaired Grooming: Wash/dry face, Supervision/safety, Sitting Upper Body Bathing: Minimal assistance, Sitting Lower Body Bathing: Total assistance Lower Body Bathing Details (indicate cue type and reason): pt with incontinence of stool and lack of awareness Upper Body Dressing : Moderate assistance Lower Body Dressing: Maximal assistance, Sitting/lateral leans Toilet Transfer: +2 for physical assistance, +2 for safety/equipment, Maximal assistance, Stand-pivot Toilet Transfer Details (indicate cue type and reason): unable to maintain L LE NWB but able to verbalized Functional mobility during ADLs: Moderate assistance, +2 for physical assistance General ADL Comments: Pt transfered EOB to chair with R transfer. Pt requires RW and max  cues. pt complete sit<>Stand from chair to help with positioning of towel for scrotal edema.     Mobility  Overal bed mobility: Needs Assistance Bed Mobility: Supine to Sit Supine to sit: +2 for physical assistance, Max assist General bed mobility comments: Pt performed slow helicopter pivot to edge of bed.  PTA assisted B LEs to edge of  bed and assisted patient with trunk elevation.  Pt with VCs to keep legs close and avoid excessive hip abduction.      Transfers  Overall transfer level: Needs assistance Equipment used: None Transfers: Squat Pivot Transfers Sit to Stand: +2 physical assistance, Max assist Stand pivot transfers: +2 physical assistance, Max assist Squat pivot transfers: Mod assist, +2 physical assistance General transfer comment: Pt needs pad use to help with hip extension. pt requries (A) to rotate to the R and pivot on R foot.     Ambulation / Gait / Stairs / Wheelchair Mobility  Ambulation/Gait Ambulation/Gait assistance:  (Pt is transfers only for 8 weeks.  )    Posture / Balance Dynamic Sitting Balance Sitting balance - Comments: able to tolerate EOB activity with out assist but some instability noted with increased distractions Balance Overall balance assessment: Needs assistance Sitting-balance support: Feet supported, Bilateral upper extremity supported Sitting balance-Leahy Scale: Fair Sitting balance - Comments: able to tolerate EOB activity with out assist but some instability noted with increased distractions Standing balance support: Bilateral upper extremity supported Standing balance-Leahy Scale: Poor Standing balance comment: requies UE support    Special needs/care consideration BiPAP/CPAP No CPM No Continuous Drip IV No Dialysis No     Life Vest No Oxygen No Special Bed No Trach Size No Wound Vac (area) No       Skin Swelling in scrotal area, bruising on skin.  Has incisions mid pubic area and left back.             Bowel mgmt: Last BM  08/29/16 Bladder mgmt: Foley catheter to remain in place due to scrotal swelling Diabetic mgmt No    Previous Home Environment Living Arrangements: Spouse/significant other Available Help at Discharge: Family, Available 24 hours/day Type of Home: House Home Layout: One level Home Access: Stairs to enter Entrance Stairs-Rails: Right Entrance Stairs-Number of Steps: 2 through one way and 6 through other; plan to put ramp where the 2 steps are Bathroom Shower/Tub: Chiropodist: Standard Home Care Services: No  Discharge Living Setting Plans for Discharge Living Setting: Patient's home, House, Lives with (comment) (Lives with wife.) Type of Home at Discharge: House Discharge Home Layout: One level Discharge Home Access: Stairs to enter Entrance Stairs-Number of Steps: 2 steps side and 3 steps front entry. Does the patient have any problems obtaining your medications?: No  Social/Family/Support Systems Patient Roles: Spouse, Parent (Lives with wife.  Two children local but work.) Contact Information: Costa Jha - wife Anticipated Caregiver: wife Anticipated Caregiver's Contact Information: [name] - wife (c) [phone] Ability/Limitations of Caregiver: Wife is not working and can assist.  Wife stays with patient at night.. Caregiver Availability: 24/7 Discharge Plan Discussed with Primary Caregiver: Yes Is Caregiver In Agreement with Plan?: Yes Does Caregiver/Family have Issues with Lodging/Transportation while Pt is in Rehab?: No  Goals/Additional Needs Patient/Family Goal for Rehab: PT/OT mod I and supervision goals Expected length of stay: 4-7 days Cultural Considerations: Attends the [employer] in [place] Needs: Heart diet, thin liquids Equipment Needs: TBD Pt/Family Agrees to Admission and willing to participate: Yes Program Orientation Provided & Reviewed with Pt/Caregiver Including Roles  & Responsibilities: Yes  Decrease  burden of Care through IP rehab admission: [name]  Possible need for SNF placement upon discharge: Not planned  Patient Condition: This patient's medical and functional status has changed since the consult dated: 08/29/16 in which the Rehabilitation Physician determined and documented that the patient's condition is appropriate  for intensive rehabilitative care in an inpatient rehabilitation facility. See "History of Present Illness" (above) for medical update. Functional changes are:  Currently requiring mod assist +2 for transfers. Patient's medical and functional status update has been discussed with the Rehabilitation physician and patient remains appropriate for inpatient rehabilitation. Will admit to inpatient rehab today.  Preadmission Screen Completed By:  Retta Diones, 08/31/2016 2:31 PM ______________________________________________________________________   Discussed status with Dr. Posey Pronto on 08/31/16 at 1431 and received telephone approval for admission today.  Admission Coordinator:  Retta Diones, time1431/Date3/14/18

## 2016-08-31 NOTE — Progress Notes (Signed)
Occupational Therapy Treatment Patient Details Name: [name] [name] MRN: [id number] DOB: Nov 29, 1940 Today's Date: 08/31/2016    History of present illness  76 y/o male s/p ORIF pubic symphysis disruption and left SI screw fixation. Pt fell from 8-10 feet from a ladder onto back, resulting in Symphysis diastasis and left SI widening. Found to have Left pelvic extraperitoneal hemorrhage with a large hematoma PMH: PVD, MI, HTN, HLD, CAD, COPD, CKD, A-fib   OT comments  Pt to d/c to CIR today. Pt needs max (A) for LB adls at this time and total +2 (A) for basic transfer.   Follow Up Recommendations  CIR;Supervision/Assistance - 24 hour    Equipment Recommendations  Other (comment)    Recommendations for Other Services      Precautions / Restrictions Precautions Precautions: Fall Restrictions RLE Weight Bearing: Weight bearing as tolerated LLE Weight Bearing: Non weight bearing Other Position/Activity Restrictions: bed to chair only for 8 weeks; WBAT RLE for transfers only       Mobility Bed Mobility Overal bed mobility: Needs Assistance Bed Mobility: Supine to Sit     Supine to sit: +2 for physical assistance;Max assist     General bed mobility comments: Pt performed slow helicopter pivot to edge of bed.  PTA assisted B LEs to edge of bed and assisted patient with trunk elevation.  Pt with VCs to keep legs close and avoid excessive hip abduction.    Transfers Overall transfer level: Needs assistance Equipment used: None Transfers: Squat Pivot Transfers Sit to Stand: +2 physical assistance;Max assist Stand pivot transfers: +2 physical assistance;Max assist       General transfer comment: Pt needs pad use to help with hip extension. pt requries (A) to rotate to the R and pivot on R foot.     Balance                                   ADL Overall ADL's : Needs assistance/impaired                         Toilet Transfer: +2 for physical  assistance;+2 for safety/equipment;Maximal assistance;Stand-pivot Toilet Transfer Details (indicate cue type and reason): unable to maintain L LE NWB but able to verbalized                  Vision                     Perception     Praxis      Cognition   Behavior During Therapy: [facility] for tasks assessed/performed Overall Cognitive Status: Impaired/Different from baseline Area of Impairment: Safety/judgement                General Comments: pt likes to joke and needs redireciton at times. questions HOH also affecting responses      Exercises     Shoulder Instructions       General Comments      Pertinent Vitals/ Pain       Pain Assessment: Faces Faces Pain Scale: Hurts even more Pain Location: LLE hips Pain Descriptors / Indicators: Aching;Sore;Grimacing;Guarding Pain Intervention(s): Limited activity within patient's tolerance;Monitored during session;Premedicated before session;Repositioned  Home Living  Prior Functioning/Environment              Frequency  Min 2X/week        Progress Toward Goals  OT Goals(current goals can now be found in the care plan section)  Progress towards OT goals: Progressing toward goals  Acute Rehab OT Goals Patient Stated Goal: to get better OT Goal Formulation: With patient/family Time For Goal Achievement: 09/10/16 Potential to Achieve Goals: Good ADL Goals Pt Will Perform Grooming: with set-up;with supervision;sitting Pt Will Perform Lower Body Bathing: with min guard assist;sitting/lateral leans Pt Will Perform Lower Body Dressing: with min guard assist;sitting/lateral leans Pt Will Transfer to Toilet: with mod assist;stand pivot transfer;bedside commode Pt Will Perform Toileting - Clothing Manipulation and hygiene: with min guard assist;sitting/lateral leans  Plan Discharge plan remains appropriate    Co-evaluation    PT/OT/SLP  Co-Evaluation/Treatment: Yes Reason for Co-Treatment: Complexity of the patient's impairments (multi-system involvement);Necessary to address cognition/behavior during functional activity;For patient/therapist safety;To address functional/ADL transfers   OT goals addressed during session: ADL's and self-care;Proper use of Adaptive equipment and DME;Strengthening/ROM      End of Session Equipment Utilized During Treatment: Gait belt;Rolling walker  OT Visit Diagnosis: Other abnormalities of gait and mobility (R26.89)   Activity Tolerance Patient tolerated treatment well   Patient Left in chair;with call bell/phone within reach;with family/visitor present   Nurse Communication Mobility status;Precautions;Weight bearing status        Time: 1330-1350 OT Time Calculation (min): 20 min  Charges: OT General Charges $OT Visit: 1 Procedure OT Treatments $Therapeutic Activity: 8-22 mins   Jeri Modena   OTR/L Pager: [phone] Office: [phone] .    Parke Poisson B 08/31/2016, 2:17 PM

## 2016-08-31 NOTE — Progress Notes (Signed)
Case Management Note  Patient Details Name: ARVELL PULSIFER MRN: [id number] Date of Birth: 12-07-1940  Subjective/Objective:Pt admitted on 08/22/16 s/p fall from ladder with pelvic fractures and back pain/contusion. PTA, pt independent and from home with spouse.    Action/Plan: Pt to OR today for ORIF of the symphysis and Lt SI screw fixation. Will follow for discharge planning as pt progresses.   Expected Discharge Date: 08/31/16 Expected Discharge Plan: [place]  In-House Referral:   Discharge planning ServicesCM Consult  Post Acute Care Choice:  Choice offered to:   DME Arranged:  DME Agency:   HH Arranged:  [place] Agency:   Status of Service: Completed, will sign off If discussed at Long Length of Stay Meetings, dates discussed:   Additional Comments:  08/30/16 J. Latorria Zeoli, RN, BSN CIR continuing to follow for possible admission pending bed availability.  Hopeful for bed tomorrow.  Will follow progress.    08/31/16 J. Aarian Griffie, RN, BSN Pt accepted and bed available for admission to [facility] today.     Reinaldo Raddle, RN, BSN  Trauma/Neuro ICU Case Manager [phone]

## 2016-08-31 NOTE — Progress Notes (Signed)
Rehab admissions - I met with patient and his wife.  I do have a bed available and will admit to acute inpatient rehab today.  I spoke with trauma team who have cleared patient for inpatient rehab admission today.  Call me for questions.  #[phone]

## 2016-09-01 ENCOUNTER — Inpatient Hospital Stay (HOSPITAL_COMMUNITY): Payer: Medicare Other

## 2016-09-01 ENCOUNTER — Ambulatory Visit: Payer: Medicare Other | Admitting: Urology

## 2016-09-01 ENCOUNTER — Inpatient Hospital Stay (HOSPITAL_COMMUNITY): Payer: Medicare Other | Admitting: Physical Therapy

## 2016-09-01 ENCOUNTER — Ambulatory Visit
Admit: 2016-09-01 | Discharge: 2016-09-01 | Disposition: A | Discharge status: Home / Self Care | Payer: Medicare Other | Attending: Radiation Oncology | Admitting: Radiation Oncology

## 2016-09-01 ENCOUNTER — Inpatient Hospital Stay (HOSPITAL_COMMUNITY): Payer: Medicare Other | Admitting: Occupational Therapy

## 2016-09-01 DIAGNOSIS — I82433 Acute embolism and thrombosis of popliteal vein, bilateral: Secondary | ICD-10-CM

## 2016-09-01 DIAGNOSIS — E46 Unspecified protein-calorie malnutrition: Secondary | ICD-10-CM

## 2016-09-01 DIAGNOSIS — G479 Sleep disorder, unspecified: Secondary | ICD-10-CM

## 2016-09-01 DIAGNOSIS — M7989 Other specified soft tissue disorders: Secondary | ICD-10-CM

## 2016-09-01 DIAGNOSIS — E871 Hypo-osmolality and hyponatremia: Secondary | ICD-10-CM

## 2016-09-01 DIAGNOSIS — Z5181 Encounter for therapeutic drug level monitoring: Secondary | ICD-10-CM

## 2016-09-01 DIAGNOSIS — I2510 Atherosclerotic heart disease of native coronary artery without angina pectoris: Secondary | ICD-10-CM

## 2016-09-01 DIAGNOSIS — R339 Retention of urine, unspecified: Secondary | ICD-10-CM

## 2016-09-01 DIAGNOSIS — G9389 Other specified disorders of brain: Secondary | ICD-10-CM

## 2016-09-01 DIAGNOSIS — R918 Other nonspecific abnormal finding of lung field: Secondary | ICD-10-CM

## 2016-09-01 DIAGNOSIS — E876 Hypokalemia: Secondary | ICD-10-CM

## 2016-09-01 LAB — CBC WITH DIFFERENTIAL/PLATELET
BASOS ABS: 0 10*3/uL (ref 0.0–0.1)
Basophils Relative: 0 %
EOS PCT: 2 %
Eosinophils Absolute: 0.1 10*3/uL (ref 0.0–0.7)
HEMATOCRIT: 26.6 % — AB (ref 39.0–52.0)
Hemoglobin: 9.2 g/dL — ABNORMAL LOW (ref 13.0–17.0)
LYMPHS ABS: 1.1 10*3/uL (ref 0.7–4.0)
LYMPHS PCT: 15 %
MCH: 30.3 pg (ref 26.0–34.0)
MCHC: 34.6 g/dL (ref 30.0–36.0)
MCV: 87.5 fL (ref 78.0–100.0)
Monocytes Absolute: 0.5 10*3/uL (ref 0.1–1.0)
Monocytes Relative: 8 %
NEUTROS ABS: 5.2 10*3/uL (ref 1.7–7.7)
Neutrophils Relative %: 75 %
PLATELETS: 185 10*3/uL (ref 150–400)
RBC: 3.04 MIL/uL — AB (ref 4.22–5.81)
RDW: 15.2 % (ref 11.5–15.5)
WBC: 6.9 10*3/uL (ref 4.0–10.5)

## 2016-09-01 LAB — COMPREHENSIVE METABOLIC PANEL
ALK PHOS: 78 U/L (ref 38–126)
ALT: 20 U/L (ref 17–63)
AST: 33 U/L (ref 15–41)
Albumin: 2.4 g/dL — ABNORMAL LOW (ref 3.5–5.0)
Anion gap: 8 (ref 5–15)
BILIRUBIN TOTAL: 1.7 mg/dL — AB (ref 0.3–1.2)
BUN: 14 mg/dL (ref 6–20)
CALCIUM: 8.2 mg/dL — AB (ref 8.9–10.3)
CHLORIDE: 98 mmol/L — AB (ref 101–111)
CO2: 27 mmol/L (ref 22–32)
CREATININE: 0.77 mg/dL (ref 0.61–1.24)
Glucose, Bld: 103 mg/dL — ABNORMAL HIGH (ref 65–99)
Potassium: 3.4 mmol/L — ABNORMAL LOW (ref 3.5–5.1)
Sodium: 133 mmol/L — ABNORMAL LOW (ref 135–145)
TOTAL PROTEIN: 5.1 g/dL — AB (ref 6.5–8.1)

## 2016-09-01 MED ORDER — LIDOCAINE HCL 2 % EX GEL
1.0000 "application " | Freq: Four times a day (QID) | CUTANEOUS | Status: DC
  Administered 2016-09-01 – 2016-09-02 (×3): 1 via URETHRAL
  Filled 2016-09-01 (×2): qty 5

## 2016-09-01 MED ORDER — ASPIRIN 81 MG PO CHEW
81.0000 mg | CHEWABLE_TABLET | Freq: Every day | ORAL | Status: DC
  Administered 2016-09-01 – 2016-09-03 (×3): 81 mg via ORAL
  Filled 2016-09-01 (×3): qty 1

## 2016-09-01 MED ORDER — ENOXAPARIN SODIUM 100 MG/ML ~~LOC~~ SOLN
1.0000 mg/kg | Freq: Two times a day (BID) | SUBCUTANEOUS | Status: DC
  Administered 2016-09-02 – 2016-09-08 (×12): 85 mg via SUBCUTANEOUS
  Filled 2016-09-01 (×14): qty 0.85

## 2016-09-01 MED ORDER — POTASSIUM CHLORIDE CRYS ER 20 MEQ PO TBCR
20.0000 meq | EXTENDED_RELEASE_TABLET | Freq: Two times a day (BID) | ORAL | Status: AC
  Administered 2016-09-01 (×2): 20 meq via ORAL
  Filled 2016-09-01 (×2): qty 1

## 2016-09-01 MED ORDER — TRAZODONE HCL 50 MG PO TABS
25.0000 mg | ORAL_TABLET | Freq: Every evening | ORAL | Status: DC | PRN
  Administered 2016-09-01: 25 mg via ORAL
  Filled 2016-09-01 (×2): qty 1

## 2016-09-01 MED ORDER — PRO-STAT SUGAR FREE PO LIQD
30.0000 mL | Freq: Two times a day (BID) | ORAL | Status: DC
  Administered 2016-09-01 – 2016-09-08 (×14): 30 mL via ORAL
  Filled 2016-09-01 (×14): qty 30

## 2016-09-01 MED ORDER — ENOXAPARIN SODIUM 60 MG/0.6ML ~~LOC~~ SOLN
45.0000 mg | Freq: Once | SUBCUTANEOUS | Status: AC
  Administered 2016-09-01: 45 mg via SUBCUTANEOUS
  Filled 2016-09-01: qty 0.6

## 2016-09-01 NOTE — Progress Notes (Signed)
ANTICOAGULATION CONSULT NOTE - Initial Consult  Pharmacy Consult for lovenox Indication: acute DVT bilateral posterior tibial and peroneal veins  Allergies  Allergen Reactions  . Imitrex [Sumatriptan] Nausea And Vomiting and Other (See Comments)    "made me like I was having a stoke"  . Lipitor [Atorvastatin] Nausea And Vomiting and Other (See Comments)    INTOLERANCE > MYALGIAS "couldn't get out of the bed ( pt had to crawl out of bed ) I hurt [name] [name] bad"    Patient Measurements: Height: '5\' 6"'$  (167.6 cm) Weight: 185 lb 6.4 oz (84.1 kg) IBW/kg (Calculated) : 63.8 Heparin Dosing Weight:   Vital Signs: Temp: 98.2 F (36.8 C) (03/15 0617) Temp Source: Oral (03/15 0617) BP: 146/74 (03/15 0617) Pulse Rate: 77 (03/15 0617)  Labs:  Recent Labs  08/30/16 0724 08/31/16 0714 09/01/16 0427  HGB 9.2* 9.4* 9.2*  HCT 26.7* 27.1* 26.6*  PLT 141* 156 185  CREATININE  --   --  0.77    Estimated Creatinine Clearance: 77.4 mL/min (by C-G formula based on SCr of 0.77 mg/dL).   Medical History: Past Medical History:  Diagnosis Date  . Atrial fibrillation ([place])    no hx of reported at preop visit of 04/21/11   . CAD (coronary artery disease)   . Carotid artery occlusion    left carotid endarterectomy  . Chronic kidney disease    AAA repair with reimplant of renals   . CHRONIC OBSTRUCTIVE PULMONARY DISEASE    pt denied at visit of 04/21/11   . CORONARY ARTERY DISEASE   . Disorder of left sacroiliac joint 08/24/2016  . DIVERTICULOSIS OF COLON   . GERD   . H/O hiatal hernia   . Headache(784.0)    Hx: of years of years  . HYPERTENSION   . HYPOTHYROIDISM   . JOINT EFFUSION, KNEE   . KNEE, ARTHRITIS, DEGEN./OSTEO    right knee   . Myocardial infarction    1994   . NEPHROLITHIASIS, HX OF   . OSTEOPOROSIS   . Other dysphagia   . PERIPHERAL VASCULAR DISEASE    AAA - 1994 with reimplant of renals   . Peripheral vascular disease ([place])    subclavian stenosis PTA - 3/08   . Pure  hypercholesterolemia   . Renal artery stenosis ([facility])     Medications:  Scheduled:  . clopidogrel  75 mg Oral Daily  . docusate sodium  100 mg Oral BID  . enoxaparin (LOVENOX) injection  40 mg Subcutaneous Q24H  . feeding supplement (PRO-STAT SUGAR FREE 64)  30 mL Oral BID  . levothyroxine  112 mcg Oral QAC breakfast  . metoprolol  50 mg Oral BID  . pantoprazole  40 mg Oral Daily  . potassium chloride  20 mEq Oral BID  . rosuvastatin  20 mg Oral q1800  . senna  1 tablet Oral Daily  . tamsulosin  0.4 mg Oral Daily  . triamterene-hydrochlorothiazide  1 tablet Oral Daily   Infusions:    Assessment: 76 yo male with acute DVT bilateral posterior tibial and peroneal veins will be changed to treatment dosing of lovenox.  CrCl 77.4.  Hgb 9.2 and Plt 185 K on 03/15.  Last dose of lovenox was 40 mg at 1105 on 09/01/16.  Goal of Therapy:  Anti-Xa level 0.6-1 units/ml 4hrs after LMWH dose given Monitor platelets by anticoagulation protocol: Yes   Plan:  - Lovenox 45 mg sq x1 to equal 85 mg for the 1st dose, then start 85  mg sq q12h thereafter - CBC every 72 hours - Follow up on oral anticoag option  [name] [name], Tsz-Yin 09/01/2016,1:27 PM

## 2016-09-01 NOTE — Progress Notes (Signed)
VASCULAR LAB PRELIMINARY  PRELIMINARY  PRELIMINARY  PRELIMINARY  Bilateral lower extremity venous duplex completed.    Preliminary report:  There is acute DVT noted in the bilateral posterior tibial and peroneal veins.    Called results to [place], RN  [facility], [place], RVT 09/01/2016, 12:02 PM

## 2016-09-01 NOTE — Progress Notes (Signed)
Patient information reviewed and entered into eRehab system by Raahil Ong, RN, CRRN, PPS Coordinator.  Information including medical coding and functional independence measure will be reviewed and updated through discharge.     Per nursing patient was given "Data Collection Information Summary for Patients in Inpatient Rehabilitation Facilities with attached "Privacy Act Statement-Health Care Records" upon admission.  

## 2016-09-01 NOTE — Progress Notes (Signed)
Retta Diones, RN Rehab Admission Coordinator Signed Physical Medicine and Rehabilitation  PMR Pre-admission Date of Service: 08/31/2016 2:16 PM  Related encounter: ED to Hosp-Admission (Discharged) from 08/22/2016 in [place]       '[]'$ Hide copied text PMR Admission Coordinator Pre-Admission Assessment  Patient: [name] [name] is an 76 y.o., male MRN: [id number] DOB: 1941/03/24 Height: '5\' 6"'$  (167.6 cm) Weight: 71.7 kg (158 lb)                                                                                                                                                  Insurance Information HMO: No   PPO:       PCP:       IPA:       80/20:       OTHER:   PRIMARY: Medicare A/B      Policy#:  [id number]      Subscriber:  [name] CM Name:        Phone#:       Fax#:   Pre-Cert#:        Employer:  Retired Benefits:  Phone #:       Name: Checked in [place]. Date: 07/22/03     Deduct:  $1340      Out of Pocket Max:  none      Life Max: unlimited CIR: 100%      SNF: 100 days Outpatient: 80%     Co-Pay: 20% Home Health: 100%      Co-Pay: none DME: 80%     Co-Pay: 20% Providers: patient's choice  SECONDARY:  BCBS      Policy#: [id number]      Subscriber:  [name] CM Name:        Phone#:       Fax#:   Pre-Cert#:        Employer:  Retired Benefits:  Phone #:  [phone]     Name:   Eff. Date:       Deduct:        Out of Pocket Max:        Life Max:   CIR:        SNF:   Outpatient:       Co-Pay:   Home Health:        Co-Pay:   DME:       Co-Pay:    Emergency Contact Information        Contact Information    Name Relation Home Work Mobile   [place] Spouse [phone]  [phone]   Collie, Wernick   [phone]   Harlie, Ragle Daughter   [phone]     Current Medical History  Patient Admitting Diagnosis: Polytrauma   History of Present Illness: A 76 y.o.malewith history of PVD with  multiple revascularization procedures, left carotid  surgery, CAD status post CABG, atrial fibrillation maintained on aspirin and Plavix. History taken from chart review, patient, and wife. Patient lives with spouse. Independent prior to admission. One level home with 2 steps to entry. Presented 08/22/2016 after a fall from a ladder after attempting to clean out his stove pipe. Denied loss of consciousness. Cranial CT scan reviewed, unremarkable for acute process but did show abnormal anterior left frontal lobe appearance did not resemble typical traumatic injury but instead suspicious for a 2 cm brain mass with mild surrounding vasogenic edema.. CT cervical spine negative. CT abdomen and pelvis showed disruption of the symphysis pubis and left sacroiliac joint with retroperitoneal hemorrhage in the left side of the pelvis and subcutaneous hemorrhage in the left buttocks as well as fractures left transverse process of L2-S1. Also with incidental findings of spiculated mass in the right middle lobe consistent with carcinoma of the lung with metastatic adenopathy in the subcarinalregion. Several small pulmonary nodules which are too small to characterize. Underwent open reduction internal fixation of pubic symphysis with sacroiliac screw fixation of left SI joint 08/25/2016 per Dr. Marcelino Scot. Nonweightbearing left lower extremity. Weightbearing as tolerated right lower extremity for transfers only. Bed to chair only for 8 weeks. Hospital course pain management. Lung mass to be evaluated as an outpatient. Acute blood loss anemia 6.9 transfused late hemoglobin 9.2.Foley catheter tube remains in place for scrotal edema. Plan voiding trial.Subcutaneous Lovenox added for DVT prophylaxis 08/29/2016. Physical and occupational therapy evaluations completed with recommendations of physical medicine rehabilitation consult. Patient to be admitted for a comprehensive inpatient rehabilitation program.   Past Medical History        Past Medical History:  Diagnosis Date  . Atrial fibrillation ([place])    no hx of reported at preop visit of 04/21/11   . CAD (coronary artery disease)   . Carotid artery occlusion    left carotid endarterectomy  . Chronic kidney disease    AAA repair with reimplant of renals   . CHRONIC OBSTRUCTIVE PULMONARY DISEASE    pt denied at visit of 04/21/11   . CORONARY ARTERY DISEASE   . Disorder of left sacroiliac joint 08/24/2016  . DIVERTICULOSIS OF COLON   . GERD   . H/O hiatal hernia   . Headache(784.0)    Hx: of years of years  . HYPERTENSION   . HYPOTHYROIDISM   . JOINT EFFUSION, KNEE   . KNEE, ARTHRITIS, DEGEN./OSTEO    right knee   . Myocardial infarction    1994   . NEPHROLITHIASIS, HX OF   . OSTEOPOROSIS   . Other dysphagia   . PERIPHERAL VASCULAR DISEASE    AAA - 1994 with reimplant of renals   . Peripheral vascular disease ([place])    subclavian stenosis PTA - 3/08   . Pure hypercholesterolemia   . Renal artery stenosis ([facility])     Family History  family history includes Cancer in his brother; Diabetes in his sister; Heart attack in his father; Heart disease in his father; Hypertension in his mother.  Prior Rehab/Hospitalizations: No previous rehab admissions.  Has the patient had major surgery during 100 days prior to admission? No  Current Medications   Current Facility-Administered Medications:  .  acetaminophen (TYLENOL) tablet 650 mg, 650 mg, Oral, Q6H PRN, Stark Klein, MD, 650 mg at 08/28/16 2106 .  bisacodyl (DULCOLAX) suppository 10 mg, 10 mg, Rectal, Daily PRN, Stark Klein, MD, 10 mg at 08/28/16 2311 .  clopidogrel (PLAVIX) tablet 75 mg, 75 mg,  Oral, Daily, Jill Alexanders, PA-C, 75 mg at 08/31/16 1012 .  docusate sodium (COLACE) capsule 100 mg, 100 mg, Oral, BID, Stark Klein, MD, 100 mg at 08/31/16 1013 .  enoxaparin (LOVENOX) injection 40 mg, 40 mg, Subcutaneous, Q24H, Ainsley Spinner, PA-C, 40 mg at 08/31/16 1209 .   HYDROmorphone (DILAUDID) injection 0.5-1 mg, 0.5-1 mg, Intravenous, Q2H PRN, Stark Klein, MD .  levothyroxine (SYNTHROID, LEVOTHROID) tablet 112 mcg, 112 mcg, Oral, QAC breakfast, Stark Klein, MD, 112 mcg at 08/31/16 0552 .  metoprolol (LOPRESSOR) tablet 50 mg, 50 mg, Oral, BID, Stark Klein, MD, 50 mg at 08/31/16 1013 .  nitroGLYCERIN (NITROSTAT) SL tablet 0.4 mg, 0.4 mg, Sublingual, Q5 min PRN, Stark Klein, MD .  ondansetron (ZOFRAN) injection 4 mg, 4 mg, Intravenous, Q6H PRN, Stark Klein, MD .  ondansetron (ZOFRAN) tablet 4 mg, 4 mg, Oral, Q6H PRN, Stark Klein, MD .  oxyCODONE (Oxy IR/ROXICODONE) immediate release tablet 5-10 mg, 5-10 mg, Oral, Q6H PRN, Stark Klein, MD .  oxyCODONE-acetaminophen (PERCOCET/ROXICET) 5-325 MG per tablet 1-2 tablet, 1-2 tablet, Oral, Q6H PRN, Stark Klein, MD, 2 tablet at 08/31/16 0552 .  pantoprazole (PROTONIX) EC tablet 40 mg, 40 mg, Oral, Daily, Stark Klein, MD, 40 mg at 08/31/16 1013 .  rosuvastatin (CRESTOR) tablet 20 mg, 20 mg, Oral, Daily, Stark Klein, MD, 20 mg at 08/31/16 1012 .  senna (SENOKOT) tablet 8.6 mg, 1 tablet, Oral, Daily, Stark Klein, MD, 8.6 mg at 08/31/16 1014 .  tamsulosin (FLOMAX) capsule 0.4 mg, 0.4 mg, Oral, Daily, Stark Klein, MD, 0.4 mg at 08/31/16 1014 .  triamterene-hydrochlorothiazide (MAXZIDE-25) 37.5-25 MG per tablet 1 tablet, 1 tablet, Oral, Daily, Stark Klein, MD, 1 tablet at 08/31/16 1012  Patients Current Diet: Diet Heart Room service appropriate? Yes; Fluid consistency: Thin  Precautions / Restrictions Precautions Precautions: Fall Restrictions Weight Bearing Restrictions: Yes RLE Weight Bearing: Weight bearing as tolerated LLE Weight Bearing: Non weight bearing Other Position/Activity Restrictions: bed to chair only for 8 weeks; WBAT RLE for transfers only   Has the patient had 2 or more falls or a fall with injury in the past year?No  Prior Activity Level Community (5-7x/wk): Went out daily.  Went to  [employer] 5 x a week.  Was driving.  Home Assistive Devices / Equipment Home Assistive Devices/Equipment: None Home Equipment: Walker - 2 wheels, Bedside commode, Transport chair  Prior Device Use: Indicate devices/aids used by the patient prior to current illness, exacerbation or injury? None  Prior Functional Level Prior Function Level of Independence: Independent Comments: Drives, cooks, cleans.   Self Care: Did the patient need help bathing, dressing, using the toilet or eating?  Independent  Indoor Mobility: Did the patient need assistance with walking from room to room (with or without device)? Independent  Stairs: Did the patient need assistance with internal or external stairs (with or without device)? Independent  Functional Cognition: Did the patient need help planning regular tasks such as shopping or remembering to take medications? Independent  Current Functional Level Cognition  Overall Cognitive Status: Impaired/Different from baseline Current Attention Level: Sustained Orientation Level: Oriented to person, Oriented to place Following Commands: Follows one step commands consistently Safety/Judgement: Decreased awareness of safety, Decreased awareness of deficits General Comments: pt likes to joke and needs redireciton at times. questions HOH also affecting responses    Extremity Assessment (includes Sensation/Coordination)  Upper Extremity Assessment: Overall WFL for tasks assessed  Lower Extremity Assessment: Defer to PT evaluation RLE Deficits / Details: Able to perform LAQ.  knee instability noted during standing. LLE Deficits / Details: Able to perform LAQ despite pain.    ADLs  Overall ADL's : Needs assistance/impaired Grooming: Wash/dry face, Supervision/safety, Sitting Upper Body Bathing: Minimal assistance, Sitting Lower Body Bathing: Total assistance Lower Body Bathing Details (indicate cue type and reason): pt with incontinence of stool and  lack of awareness Upper Body Dressing : Moderate assistance Lower Body Dressing: Maximal assistance, Sitting/lateral leans Toilet Transfer: +2 for physical assistance, +2 for safety/equipment, Maximal assistance, Stand-pivot Toilet Transfer Details (indicate cue type and reason): unable to maintain L LE NWB but able to verbalized Functional mobility during ADLs: Moderate assistance, +2 for physical assistance General ADL Comments: Pt transfered EOB to chair with R transfer. Pt requires RW and max cues. pt complete sit<>Stand from chair to help with positioning of towel for scrotal edema.     Mobility  Overal bed mobility: Needs Assistance Bed Mobility: Supine to Sit Supine to sit: +2 for physical assistance, Max assist General bed mobility comments: Pt performed slow helicopter pivot to edge of bed.  PTA assisted B LEs to edge of bed and assisted patient with trunk elevation.  Pt with VCs to keep legs close and avoid excessive hip abduction.      Transfers  Overall transfer level: Needs assistance Equipment used: None Transfers: Squat Pivot Transfers Sit to Stand: +2 physical assistance, Max assist Stand pivot transfers: +2 physical assistance, Max assist Squat pivot transfers: Mod assist, +2 physical assistance General transfer comment: Pt needs pad use to help with hip extension. pt requries (A) to rotate to the R and pivot on R foot.     Ambulation / Gait / Stairs / Wheelchair Mobility  Ambulation/Gait Ambulation/Gait assistance:  (Pt is transfers only for 8 weeks.  )    Posture / Balance Dynamic Sitting Balance Sitting balance - Comments: able to tolerate EOB activity with out assist but some instability noted with increased distractions Balance Overall balance assessment: Needs assistance Sitting-balance support: Feet supported, Bilateral upper extremity supported Sitting balance-Leahy Scale: Fair Sitting balance - Comments: able to tolerate EOB activity with out assist  but some instability noted with increased distractions Standing balance support: Bilateral upper extremity supported Standing balance-Leahy Scale: Poor Standing balance comment: requies UE support    Special needs/care consideration BiPAP/CPAP No CPM No Continuous Drip IV No Dialysis No     Life Vest No Oxygen No Special Bed No Trach Size No Wound Vac (area) No       Skin Swelling in scrotal area, bruising on skin.  Has incisions mid pubic area and left back.             Bowel mgmt: Last BM 08/29/16 Bladder mgmt: Foley catheter to remain in place due to scrotal swelling Diabetic mgmt No    Previous Home Environment Living Arrangements: Spouse/significant other Available Help at Discharge: Family, Available 24 hours/day Type of Home: House Home Layout: One level Home Access: Stairs to enter Entrance Stairs-Rails: Right Entrance Stairs-Number of Steps: 2 through one way and 6 through other; plan to put ramp where the 2 steps are Bathroom Shower/Tub: Chiropodist: Standard Home Care Services: No  Discharge Living Setting Plans for Discharge Living Setting: Patient's home, House, Lives with (comment) (Lives with wife.) Type of Home at Discharge: House Discharge Home Layout: One level Discharge Home Access: Stairs to enter Entrance Stairs-Number of Steps: 2 steps side and 3 steps front entry. Does the patient have any problems obtaining your medications?: No  Social/Family/Support Systems Patient Roles: Spouse, Parent (Lives with wife.  Two children local but work.) Contact Information: Ziere Docken - wife Anticipated Caregiver: wife Anticipated Caregiver's Contact Information: [name] - wife (c) [phone] Ability/Limitations of Caregiver: Wife is not working and can assist.  Wife stays with patient at night.. Caregiver Availability: 24/7 Discharge Plan Discussed with Primary Caregiver: Yes Is Caregiver In Agreement with Plan?: Yes Does  Caregiver/Family have Issues with Lodging/Transportation while Pt is in Rehab?: No  Goals/Additional Needs Patient/Family Goal for Rehab: PT/OT mod I and supervision goals Expected length of stay: 4-7 days Cultural Considerations: Attends the [employer] in [place] Needs: Heart diet, thin liquids Equipment Needs: TBD Pt/Family Agrees to Admission and willing to participate: Yes Program Orientation Provided & Reviewed with Pt/Caregiver Including Roles  & Responsibilities: Yes  Decrease burden of Care through IP rehab admission: [name]  Possible need for SNF placement upon discharge: Not planned  Patient Condition: This patient's medical and functional status has changed since the consult dated: 08/29/16 in which the Rehabilitation Physician determined and documented that the patient's condition is appropriate for intensive rehabilitative care in an inpatient rehabilitation facility. See "History of Present Illness" (above) for medical update. Functional changes are:  Currently requiring mod assist +2 for transfers. Patient's medical and functional status update has been discussed with the Rehabilitation physician and patient remains appropriate for inpatient rehabilitation. Will admit to inpatient rehab today.  Preadmission Screen Completed By:  Retta Diones, 08/31/2016 2:31 PM ______________________________________________________________________   Discussed status with Dr. Posey Pronto on 08/31/16 at 1431 and received telephone approval for admission today.  Admission Coordinator:  Retta Diones, time1431/Date3/14/18       Cosigned by: Ankit Lorie Phenix, MD at 08/31/2016 2:34 PM  Revision History

## 2016-09-01 NOTE — Progress Notes (Signed)
Received a call from vascular lab.Pt. Is positive for bilateral DVT.[facility]aur[facility] R[facility] has been notified.Keep assessing pt. Closely.

## 2016-09-01 NOTE — Evaluation (Signed)
Physical Therapy Assessment and Plan  Patient Details  Name: [name] [name] MRN: [id number] Date of Birth: 03/11/1941  PT Diagnosis: Difficulty walking, Edema, Impaired cognition, Muscle weakness and Pain in joint Rehab Potential: Good ELOS: 7-9 days   Today's Date: 09/01/2016 PT Individual Time: 0830-0930 PT Individual Time Calculation (min): 60 min    Problem List:  Patient Active Problem List   Diagnosis Date Noted  . Hypoalbuminemia due to protein-calorie malnutrition ([place])   . Hypokalemia   . Hyponatremia   . Urinary retention   . Sleep disturbance   . Acute bilateral deep vein thrombosis (DVT) of popliteal veins ([facility])   . Brain mass 08/31/2016  . Debility   . Benign essential HTN   . Scrotal edema   . Hyperlipidemia   . Constipation due to pain medication   . Fall from height of greater than 3 feet   . Acute blood loss anemia   . Lung mass   . Post-operative pain   . PVD (peripheral vascular disease) ([place])   . Coronary artery disease involving coronary bypass graft of native heart without angina pectoris   . PAF (paroxysmal atrial fibrillation) ([place])   . Disorder of left sacroiliac joint 08/24/2016  . Fall 08/22/2016  . Closed pelvic ring fracture ([place])   . Carotid artery stenosis, asymptomatic 08/12/2014  . Ejection murmur 07/03/2014  . Aftercare following surgery of the circulatory system, [place] 02/11/2014  . Weakness-Abdomin and Bilateral Thigh / Leg 02/11/2014  . Carotid stenosis 07/19/2013  . Occlusion and stenosis of carotid artery without mention of cerebral infarction 07/16/2013  . Impaired fasting glucose 07/14/2013  . Knee pain 05/23/2011  . Stiffness of joint, not elsewhere classified, lower leg 05/23/2011  . Muscle weakness (generalized) 05/23/2011  . Difficulty in walking(719.7) 05/23/2011  . Bruit 11/19/2010  . AAA (abdominal aortic aneurysm) ([place]) 11/19/2010  . OTHER DYSPHAGIA 04/27/2010  . ATRIAL FIBRILLATION 12/10/2008  . JOINT EFFUSION, KNEE  01/14/2008  . KNEE PAIN 01/14/2008  . KNEE, ARTHRITIS, DEGEN./OSTEO 12/11/2007  . Hypothyroidism 10/10/2007  . Pure hypercholesterolemia 10/10/2007  . Essential hypertension 10/10/2007  . Coronary atherosclerosis 10/10/2007  . Peripheral vascular disease ([place]) 10/10/2007  . CHRONIC OBSTRUCTIVE PULMONARY DISEASE 10/10/2007  . GERD 10/10/2007  . DIVERTICULOSIS OF COLON 10/10/2007  . OSTEOPOROSIS 10/10/2007  . NEPHROLITHIASIS, HX OF 10/10/2007  . Personal history of surgery to heart and great vessels, presenting hazards to health 10/10/2007  . CHOLECYSTECTOMY, HX OF 10/10/2007  . CORONARY ARTERY BYPASS GRAFT, HX OF 10/10/2007    Past Medical History:  Past Medical History:  Diagnosis Date  . Atrial fibrillation ([place])    no hx of reported at preop visit of 04/21/11   . CAD (coronary artery disease)   . Carotid artery occlusion    left carotid endarterectomy  . Chronic kidney disease    AAA repair with reimplant of renals   . CHRONIC OBSTRUCTIVE PULMONARY DISEASE    pt denied at visit of 04/21/11   . CORONARY ARTERY DISEASE   . Disorder of left sacroiliac joint 08/24/2016  . DIVERTICULOSIS OF COLON   . GERD   . H/O hiatal hernia   . Headache(784.0)    Hx: of years of years  . HYPERTENSION   . HYPOTHYROIDISM   . JOINT EFFUSION, KNEE   . KNEE, ARTHRITIS, DEGEN./OSTEO    right knee   . Myocardial infarction    1994   . NEPHROLITHIASIS, HX OF   . OSTEOPOROSIS   . Other dysphagia   .  PERIPHERAL VASCULAR DISEASE    AAA - 1994 with reimplant of renals   . Peripheral vascular disease ([place])    subclavian stenosis PTA - 3/08   . Pure hypercholesterolemia   . Renal artery stenosis [facility])    Past Surgical History:  Past Surgical History:  Procedure Laterality Date  . ABDOMINAL AORTIC ANEURYSM REPAIR     with reimplantation of renals   . CARDIAC CATHETERIZATION     2008  . CAROTID ENDARTERECTOMY Left Jan. 30, 2015   CEA  . CHOLECYSTECTOMY  2000   Gall Bladder  . COLONOSCOPY  W/ BIOPSIES AND POLYPECTOMY     Hx: of  . CORONARY ARTERY BYPASS GRAFT     1995  . CORONARY STENT PLACEMENT     Hx: of  . ENDARTERECTOMY Left 07/19/2013   Procedure: ENDARTERECTOMY CAROTID;  Surgeon: Mal Misty, MD;  Location: [place];  Service: Vascular;  Laterality: Left;  . GALLBLADDER SURGERY  2004   . JOINT REPLACEMENT     partial knee replacement on left 2002   . KNEE SURGERY    . ORIF PELVIC FRACTURE Left 08/25/2016   Procedure: OPEN REDUCTION INTERNAL FIXATION (ORIF) PELVIC FRACTURE; plate on front, SI screw on the back;  Surgeon: Altamese De Baca, MD;  Location: [place] [facility];  Service: Orthopedics;  Laterality: Left;  . OTHER SURGICAL HISTORY     left subclavian stenosis surgery PTA 08/2006   . OTHER SURGICAL HISTORY     carotid surgery on right 2004   . TOTAL KNEE ARTHROPLASTY  04/29/2011   Procedure: TOTAL KNEE ARTHROPLASTY;  Surgeon: Gearlean Alf;  Location: [facility];  Service: Orthopedics;  Laterality: Right;  [name] VASCULAR SURGERY     AAA  . VASECTOMY  1973    Assessment & Plan Clinical Impression: Patient is a 76 y.o. year old male with recent admission to the hospital with history of PVD with multiple revascularization procedures, left carotid surgery, CAD status post CABG, atrial fibrillation maintained on aspirin and Plavix. History taken from chart review, patient, and wife. Patient lives with spouse. Independent prior to admission. One level home with 2 steps to entry. Presented 08/22/2016 after a fall from a ladder after attempting to clean out his stove pipe. Denied loss of consciousness. Cranial CT scan reviewed, unremarkable for acute process but did show abnormal anterior left frontal lobe appearance did not resemble typical traumatic injury but instead suspicious for a 2 cm brain mass with mild surrounding vasogenic edema.. CT cervical spine negative. CT abdomen and pelvis showed disruption of the symphysis pubis and left sacroiliac joint with retroperitoneal hemorrhage in the  left side of the pelvis and subcutaneous hemorrhage in the left buttocks as well as fractures left transverse process of L2-S1. Also with incidental findings of spiculated mass in the right middle lobe consistent with carcinoma of the lung with metastatic adenopathy in the subcarinalregion. Several small pulmonary nodules which are too small to characterize. Underwent open reduction internal fixation of pubic symphysis with sacroiliac screw fixation of left SI joint 08/25/2016 per Dr. Marcelino Scot. Nonweightbearing left lower extremity. Weightbearing as tolerated right lower extremity for transfers only. Bed to chair only for 8 weeks. Hospital course pain management. Lung mass to be evaluated as an outpatient. Acute blood loss anemia 6.9 transfused late hemoglobin 9.2.Foley catheter tube remains in place for scrotal edema. Plan voiding trial. Subcutaneous Lovenox added for DVT prophylaxis 08/29/2016.Physical and occupational therapy evaluations completed with recommendations of physical medicine rehabilitation consult..  Patient transferred to  CIR on 08/31/2016 .   Patient currently requires mod with mobility secondary to muscle weakness and muscle joint tightness, decreased cardiorespiratoy endurance, decreased motor planning, decreased initiation, decreased problem solving, decreased safety awareness, decreased memory and delayed processing and decreased sitting balance, decreased standing balance, decreased postural control, decreased balance strategies and difficulty maintaining precautions.  Prior to hospitalization, patient was independent  with mobility and lived with Spouse in a House home.  Home access is 2 through one way and 6 through other; plan to put ramp where the 2 steps areStairs to enter.  Patient will benefit from skilled PT intervention to maximize safe functional mobility, minimize fall risk and decrease caregiver burden for planned discharge home with 24 hour assist.  Anticipate patient will  benefit from follow up [facility] at discharge.  PT - End of Session Activity Tolerance: Decreased this session Endurance Deficit: Yes Endurance Deficit Description: becomes short of breath easily after just a few minutes of activity PT Assessment Rehab Potential (ACUTE/IP ONLY): Good Barriers to Discharge: [place] home environment (recommending ramp- family plans to build one) PT Patient demonstrates impairments in the following area(s): Balance;Edema;Endurance;Motor;Pain;Safety;Skin Integrity PT Transfers Functional Problem(s): Bed Mobility;Bed to Chair;Car;Furniture PT Locomotion Functional Problem(s): Ambulation;Wheelchair Mobility;Stairs PT Plan PT Intensity: Minimum of 1-2 x/day ,45 to 90 minutes PT Frequency: 5 out of 7 days PT Duration Estimated Length of Stay: 7-9 days PT Treatment/Interventions: Balance/vestibular training;Cognitive remediation/compensation;Discharge planning;Disease management/prevention;DME/adaptive equipment instruction;Functional mobility training;Neuromuscular re-education;Pain management;Patient/family education;Psychosocial support;Skin care/wound management;Splinting/orthotics;Therapeutic Activities;UE/LE Strength taining/ROM;UE/LE Coordination activities;Therapeutic Exercise;Wheelchair propulsion/positioning PT Transfers Anticipated Outcome(s): supervision  PT Locomotion Anticipated Outcome(s): supervision w/c mobility PT Recommendation Recommendations for Other Services: Speech consult Follow Up Recommendations: Home health PT;24 hour supervision/assistance Patient destination: Home Equipment Recommended: Wheelchair (measurements);Wheelchair cushion (measurements)  Skilled Therapeutic Intervention Evaluation completed (see details above and below) with education on PT POC and goals and individual treatment initiated with focus on functional transfers (squat pivot and stand pivot with RW), bed mobility, w/c propulsion and parts management, simulated car  transfer, and education on rehab schedule/conferences. Pt noted with cognitive impairments which pt's wife reports as being new since injury. Recommending SLP consult. Pt required overall mod assist with cues for sequencing, initiation, hand placement during mobility, maintaining WB status, and problem solving. Pt with poor carryover during session. Pt with difficulty maintaining NWB status in standing but able to adhere to restrictions better during squat pivot transfers.    PT Evaluation Precautions/Restrictions Precautions Precautions: Fall Restrictions Weight Bearing Restrictions: Yes RLE Weight Bearing: Weight bearing as tolerated LLE Weight Bearing: Non weight bearing Other Position/Activity Restrictions: bed to chair only for 8 weeks; WBAT RLE for transfers only General   Vital Signs Pain Pain Assessment Pain Assessment: No/denies pain Home Living/Prior Functioning Home Living Available Help at Discharge: Family;Available 24 hours/day Type of Home: House Home Access: Stairs to enter [employer] of Steps: 2 through one way and 6 through other; plan to put ramp where the 2 steps are Entrance Stairs-Rails: Right Home Layout: One level Bathroom Shower/Tub: Tub/shower unit Additional Comments: cant get in bathroom with w/c   Lives With: Spouse Prior Function Level of Independence: Independent with basic ADLs;Independent with homemaking with ambulation;Independent with transfers;Independent with gait  Able to Take Stairs?: Yes Driving: Yes Vocation: Retired Comments: Heritage manager, cooks, Microbiologist.  Vision/Perception  Vision - Assessment Eye Alignment: Within Functional Limits Ocular Range of Motion: Within Functional Limits Tracking/Visual Pursuits: Impaired - to be further tested in functional context;Other (comment) (unable to follow testing) Saccades: Additional head turns  occurred during testing;Decreased speed of saccadic movement  Cognition Overall Cognitive Status:  Impaired/Different from baseline Arousal/Alertness: Awake/alert Orientation Level: Oriented to person;Disoriented to place;Disoriented to time;Disoriented to situation Memory: Impaired Memory Impairment: Decreased recall of new information;Decreased short term memory Decreased Short Term Memory: Verbal basic;Functional basic Problem Solving: Impaired Problem Solving Impairment: Functional basic Executive Function: Sequencing;Initiating Sequencing: Impaired Sequencing Impairment: Functional basic Initiating: Impaired Initiating Impairment: Functional basic Safety/Judgment: Impaired Comments: pt's wife reports prior to fall, pt has some mild "forgetfullness" but her and family have noticed a big change in his overall cognition Sensation Sensation Light Touch: Appears Intact Stereognosis: Appears Intact Hot/Cold: Appears Intact Proprioception: Appears Intact Coordination Gross Motor Movements are Fluid and Coordinated: No Fine Motor Movements are Fluid and Coordinated: Yes Coordination and Movement Description: limited by weakness Finger Nose Finger Test: not tested Motor  Motor Motor: Abnormal postural alignment and control;Other (comment) Motor - Skilled Clinical Observations: generalized motor weakness     Trunk/Postural Assessment  Cervical Assessment Cervical Assessment: Within Functional Limits Thoracic Assessment Thoracic Assessment: Within Functional Limits Lumbar Assessment Lumbar Assessment: Within Functional Limits Postural Control Postural Control: Deficits on evaluation (functional in sitting, in standing forward lean over walker)  Balance Balance Balance Assessed: Yes Static Sitting Balance Static Sitting - Level of Assistance: 5: Stand by assistance Dynamic Sitting Balance Dynamic Sitting - Level of Assistance: 5: Stand by assistance Static Standing Balance Static Standing - Level of Assistance: 4: Min assist (with BUE on RW) Dynamic Standing  Balance Dynamic Standing - Level of Assistance: 4: Min assist;3: Mod assist (difficulty maintaining WB status) Extremity Assessment  RUE Assessment RUE Assessment: Exceptions to [facility] RUE Strength RUE Overall Strength: Deficits (4-/5) LUE Assessment LUE Assessment: Exceptions to [facility] LUE Strength LUE Overall Strength Comments: 4-/5 RLE Assessment RLE Assessment: Exceptions to [facility] (grossly 3+ to 4-/5) LLE Assessment LLE Assessment: Exceptions to [facility] (3/5; limited by pain)   See Function Navigator for Current Functional Status.   Refer to Care Plan for Long Term Goals  Recommendations for other services: Other: Speech Therapy  Discharge Criteria: Patient will be discharged from PT if patient refuses treatment 3 consecutive times without medical reason, if treatment goals not met, if there is a change in medical status, if patient makes no progress towards goals or if patient is discharged from hospital.  The above assessment, treatment plan, treatment alternatives and goals were discussed and mutually agreed upon: by patient and by family  Juanna Cao, PT, DPT  09/01/2016, 12:26 PM

## 2016-09-01 NOTE — Progress Notes (Addendum)
[facility] PHYSICAL MEDICINE & REHABILITATION     PROGRESS NOTE  Subjective/Complaints:  Pt seen laying in bed this AM.  Per wife, pt restless and did not sleep well overnight.  Patient does not recall.   ROS: Denies CP, SOB, N/V/D.  Objective: Vital Signs: Blood pressure (!) 146/74, pulse 77, temperature 98.2 F (36.8 C), temperature source Oral, resp. rate 18, height '5\' 6"'$  (1.676 m), weight 84.1 kg (185 lb 6.4 oz), SpO2 96 %. No results found.  Recent Labs  08/31/16 0714 09/01/16 0427  WBC 7.2 6.9  HGB 9.4* 9.2*  HCT 27.1* 26.6*  PLT 156 185    Recent Labs  09/01/16 0427  NA 133*  K 3.4*  CL 98*  GLUCOSE 103*  BUN 14  CREATININE 0.77  CALCIUM 8.2*   CBG (last 3)  No results for input(s): GLUCAP in the last 72 hours.  Wt Readings from Last 3 Encounters:  08/31/16 84.1 kg (185 lb 6.4 oz)  08/22/16 71.7 kg (158 lb)  08/18/16 71.8 kg (158 lb 6 oz)    Physical Exam:  BP (!) 146/74 (BP Location: Left Arm)   Pulse 77   Temp 98.2 F (36.8 C) (Oral)   Resp 18   Ht '5\' 6"'$  (1.676 m)   Wt 84.1 kg (185 lb 6.4 oz)   SpO2 96%   BMI 29.92 kg/m  Constitutional: He appears well-developed and well-nourished.  HENT: Normocephalic and atraumatic.  Eyes: EOMI. No discharge.  Cardiovascular: Normal rate and regular rhythm.  No JVD. Respiratory: Effort normal. Clear GI: Soft. Bowel sounds are normal.  Musculoskeletal: He exhibits edema and tenderness.  Neurological: He is alert.  HOH A&Ox2 with cues and increased time, confused Motor: RUE 4+/5 proximal to distal LUE: 4/5 proximal to distal RLE: 3-/5 proximal to distal LLE: 3/5 HF, 3+/5 KE, 4-/5 ADF/PF  Skin: Skin is warm and dry. Dressing to ORIF c/d/i  Psychiatric: His mood appears anxious. He is slowed.   Assessment/Plan: 1. Functional deficits secondary to closed pelvic ring fracture with retroperitoneal hemorrhage in the left pelvis s/p ORIF of SI after fall which require 3+ hours per day of interdisciplinary  therapy in a comprehensive inpatient rehab setting. Physiatrist is providing close team supervision and 24 hour management of active medical problems listed below. Physiatrist and rehab team continue to assess barriers to discharge/monitor patient progress toward functional and medical goals.  Function:  Bathing Bathing position      Bathing parts      Bathing assist        Upper Body Dressing/Undressing Upper body dressing                    Upper body assist        Lower Body Dressing/Undressing Lower body dressing                                  Lower body assist        Toileting Toileting          Toileting assist     Transfers Chair/bed transfer             Locomotion Ambulation           Wheelchair          Cognition Comprehension    Expression    Social Interaction    Problem Solving    Memory  Medical Problem List and Plan: 1.  Decreased functional mobility secondary to Closed pelvic ring fracture with disorder of left sacroiliac joint with retroperitoneal hemorrhage in the left side of the pelvis with subcutaneous hemorrhage in the left buttocks after fall status post ORIF with sacroiliac screw fixation of left SI joint 08/25/2016. Weightbearing as tolerated right lower extremity for transfers only nonweightbearing left lower extremity  Begin CIR 2.  DVT Prophylaxis/Anticoagulation: Subcutaneous Lovenox initiated 08/29/2016.   Vascular pending.   Monitor for any bleeding episodes 3. Pain Management: Oxycodone and Percocet as needed 4. Mood: Provide emotional support 5. Neuropsych: This patient is not capable of making decisions on his own behalf. 6. Skin/Wound Care: Routine skin checks 7. Fluids/Electrolytes/Nutrition: Routine I&Os 8. Acute blood loss anemia.   Hb 9.2 on 3/15  Cont to monitor 9. Hypertension. Lopressor 50 mg twice a day, Maxzide 37.5.-25 milligrams daily  Monitor with increased mobility 10.  CAD status post CABG. No chest pain or shortness of breath. Plavix resumed.   Will discuss when to resume aspirin 11. Atrial fibrillation. Cardiac rate controlled. Continue Lopressor. 12. PVD with multiple revascularization procedures. Continue Plavix. 13. Right middle lobe lung/brain mass. Plan further evaluation as outpatient. 14. Scrotal edema with urinary retention.   Foley d/ced, voiding trial  Continue Flomax 15. Hypothyroidism. Synthroid 16. Hyperlipidemia. Crestor 17. Constipation. Laxative assistance 18. Hyponatremia  Na+ 133 on 3/15  Cont to monitor 19. Hypokalemia  K+ 3.4 on 3/15  Supplemented on 3/15  Cont to monitor 20. Hypoalbuminemia  Supplement initiated 3/15 21. Sleep disturbance  Trazodone PRN started 3/15  LOS (Days) 1 A FACE TO FACE EVALUATION WAS PERFORMED  [place] Wilmer Lorie Phenix 09/01/2016 8:38 AM

## 2016-09-01 NOTE — Progress Notes (Signed)
Discussed with orthopedic services in regards to acute DVT bilateral posterior tibial and peroneal veins. Increased Lovenox to 1 mg/kg. Patient on Plavix and aspirin prior to admission for history of CAD with atrial fibrillation. Recommendations by orthopedic services for Coumadin therapy if okay with cardiology services. Patient's primary cardiologist is Dr. Jenkins Rouge and will contact in regards to Coumadin therapy and discontinuation of Plavix if okay with cardiology services. Discussed finding of small retroperitoneal hemorrhage left side of pelvis after patient's initial fall orthopedic services has cleared patient to begin Coumadin therapy if okay with cardiology services.

## 2016-09-01 NOTE — Progress Notes (Signed)
Ankit Lorie Phenix, MD Physician Signed Physical Medicine and Rehabilitation  Consult Note Date of Service: 08/29/2016 10:26 AM  Related encounter: ED to Hosp-Admission (Discharged) from 08/22/2016 in [facility] Collapse All   '[]'$ Hide copied text '[]'$ Hover for attribution information      Physical Medicine and Rehabilitation Consult Reason for Consult: Closed pelvic ring fracture Referring Physician: Trauma services right handed   HPI: [name] [name] is a 76 y.o. male with history of PVD with multiple revascularization procedures, left carotid surgery, CAD status post CABG, atrial fibrillation maintained on aspirin and Plavix. History taken from chart review, patient, and wife. Patient lives with spouse. Independent prior to admission. One level home with 2 steps to entry. Presented 08/22/2016 after a fall from a ladder after attempting to clean out his stove pipe. Denied loss of consciousness. Cranial CT scan reviewed, unremarkable for acute process. CT cervical spine negative. CT abdomen and pelvis showed disruption of the symphysis pubis and left sacroiliac joint with retroperitoneal hemorrhage in the left side of the pelvis and subcutaneous hemorrhage in the left buttocks as well as fractures left transverse process of L2-S1. Also with incidental findings of spiculated mass in the right middle lobe consistent with carcinoma of the lung with metastatic adenopathy in the subcarinal region. Several small pulmonary nodules which are too small to characterize. Underwent open reduction internal fixation of pubic symphysis with sacroiliac screw fixation of left SI joint 08/25/2016 per Dr. Marcelino Scot. Nonweightbearing left lower extremity. Weightbearing as tolerated right lower extremity for transfers only. Bed to chair only for 8 weeks. Hospital course pain management. Lung mass to be evaluated as an outpatient. Acute blood loss anemia 6.9 transfused late  hemoglobin 8.3. Subcutaneous Lovenox added for DVT prophylaxis 08/29/2016.   Review of Systems  Constitutional: Negative for chills and fever.  HENT: Positive for hearing loss. Negative for tinnitus.   Eyes: Negative for blurred vision and double vision.  Respiratory: Negative for cough.        Occasional shortness of breath  Cardiovascular: Positive for leg swelling. Negative for chest pain.  Gastrointestinal: Positive for constipation. Negative for nausea and vomiting.       GERD  Genitourinary: Positive for urgency.  Musculoskeletal: Positive for falls.  Skin: Negative for rash.  Neurological: Positive for weakness and headaches. Negative for seizures.  All other systems reviewed and are negative.      Past Medical History:  Diagnosis Date  . Atrial fibrillation ([place])    no hx of reported at preop visit of 04/21/11   . CAD (coronary artery disease)   . Carotid artery occlusion    left carotid endarterectomy  . Chronic kidney disease    AAA repair with reimplant of renals   . CHRONIC OBSTRUCTIVE PULMONARY DISEASE    pt denied at visit of 04/21/11   . CORONARY ARTERY DISEASE   . Disorder of left sacroiliac joint 08/24/2016  . DIVERTICULOSIS OF COLON   . GERD   . H/O hiatal hernia   . Headache(784.0)    Hx: of years of years  . HYPERTENSION   . HYPOTHYROIDISM   . JOINT EFFUSION, KNEE   . KNEE, ARTHRITIS, DEGEN./OSTEO    right knee   . Myocardial infarction    1994   . NEPHROLITHIASIS, HX OF   . OSTEOPOROSIS   . Other dysphagia   . PERIPHERAL VASCULAR DISEASE    AAA - 1994 with reimplant of renals   .  Peripheral vascular disease ([place])    subclavian stenosis PTA - 3/08   . Pure hypercholesterolemia   . Renal artery stenosis [facility])         Past Surgical History:  Procedure Laterality Date  . ABDOMINAL AORTIC ANEURYSM REPAIR     with reimplantation of renals   . CARDIAC CATHETERIZATION     2008  . CAROTID ENDARTERECTOMY Left  Jan. 30, 2015   CEA  . CHOLECYSTECTOMY  2000   Gall Bladder  . COLONOSCOPY W/ BIOPSIES AND POLYPECTOMY     Hx: of  . CORONARY ARTERY BYPASS GRAFT     1995  . CORONARY STENT PLACEMENT     Hx: of  . ENDARTERECTOMY Left 07/19/2013   Procedure: ENDARTERECTOMY CAROTID;  Surgeon: Mal Misty, MD;  Location: [facility];  Service: Vascular;  Laterality: Left;  . GALLBLADDER SURGERY  2004   . JOINT REPLACEMENT     partial knee replacement on left 2002   . KNEE SURGERY    . ORIF PELVIC FRACTURE Left 08/25/2016   Procedure: OPEN REDUCTION INTERNAL FIXATION (ORIF) PELVIC FRACTURE; plate on front, SI screw on the back;  Surgeon: Altamese Morrison, MD;  Location: [facility];  Service: Orthopedics;  Laterality: Left;  . OTHER SURGICAL HISTORY     left subclavian stenosis surgery PTA 08/2006   . OTHER SURGICAL HISTORY     carotid surgery on right 2004   . TOTAL KNEE ARTHROPLASTY  04/29/2011   Procedure: TOTAL KNEE ARTHROPLASTY;  Surgeon: Gearlean Alf;  Location: [facility];  Service: Orthopedics;  Laterality: Right;  [name] VASCULAR SURGERY     AAA  . VASECTOMY  1973         Family History  Problem Relation Age of Onset  . Hypertension Mother   . Heart disease Father   . Heart attack Father   . Cancer Brother     Lung  . Diabetes Sister    Social History:  reports that he quit smoking about 24 years ago. His smoking use included Cigarettes. He has never used smokeless tobacco. He reports that he does not drink alcohol or use drugs. Allergies:       Allergies  Allergen Reactions  . Imitrex [Sumatriptan] Nausea And Vomiting and Other (See Comments)    "made me like I was having a stoke"  . Lipitor [Atorvastatin] Nausea And Vomiting and Other (See Comments)    INTOLERANCE > MYALGIAS "couldn't get out of the bed ( pt had to crawl out of bed ) I hurt so bad"         Medications Prior to Admission  Medication Sig Dispense Refill  . acetaminophen (TYLENOL) 325 MG  tablet Take 325-650 mg by mouth every 6 (six) hours as needed (for pain).    [name] aspirin EC 81 MG tablet Take 1 tablet (81 mg total) by mouth daily. 30 tablet 5  . clopidogrel (PLAVIX) 75 MG tablet TAKE ONE (1) TABLET EACH DAY 30 tablet 5  . Coenzyme Q10 (CO Q-10 PO) Take 1 capsule by mouth daily.    . lansoprazole (PREVACID) 15 MG capsule Take 1 capsule (15 mg total) by mouth daily. 30 capsule 5  . levothyroxine (SYNTHROID, LEVOTHROID) 112 MCG tablet Take 1 tablet (112 mcg total) by mouth daily. 30 tablet 5  . metoprolol (LOPRESSOR) 50 MG tablet Take 1 tablet (50 mg total) by mouth 2 (two) times daily. 60 tablet 5  . niacin (SLO-NIACIN) 500 MG tablet Take 1,500 mg by  mouth at bedtime.    . nitroGLYCERIN (NITROSTAT) 0.4 MG SL tablet Place 1 tablet (0.4 mg total) under the tongue every 5 (five) minutes as needed for chest pain. 25 tablet 10  . rosuvastatin (CRESTOR) 20 MG tablet Take 1 tablet (20 mg total) by mouth daily. 30 tablet 5  . tamsulosin (FLOMAX) 0.4 MG CAPS capsule Take 1 capsule (0.4 mg total) by mouth daily. 30 capsule 5  . triamterene-hydrochlorothiazide (MAXZIDE-25) 37.5-25 MG tablet Take 1 tablet by mouth every morning. 30 tablet 5    Home: Home Living Family/patient expects to be discharged to:: Inpatient rehab Living Arrangements: Spouse/significant other Available Help at Discharge: Family, Available 24 hours/day Type of Home: House Home Access: Stairs to enter [employer] of Steps: 2 through one way and 6 through other; plan to put ramp where the 2 steps are Entrance Stairs-Rails: Right Home Layout: One level Bathroom Shower/Tub: Chiropodist: Standard Home Equipment: Environmental consultant - 2 wheels, Bedside commode, Transport chair  Functional History: Prior Function Level of Independence: Independent Comments: Drives, cooks, cleans.  Functional Status:  Mobility: Bed Mobility Overal bed mobility: Needs Assistance Bed Mobility: Supine to  Sit Supine to sit: Mod assist, +2 for physical assistance General bed mobility comments: Assist for LEs and trunk elevation with use of bed pad to scoot hips. Transfers Overall transfer level: Needs assistance Equipment used: 2 person hand held assist Transfers: Sit to/from Stand, Stand Pivot Transfers Sit to Stand: Mod assist, +2 physical assistance Stand pivot transfers: Mod assist, +2 physical assistance General transfer comment: Assist to boost up from EOB and for stand pivot to chair. Assist provided to keep LLE NWB.  ADL: ADL Overall ADL's : Needs assistance/impaired Grooming: Min guard, Sitting Upper Body Bathing: Minimal assistance, Sitting Lower Body Bathing: Maximal assistance, Sitting/lateral leans Upper Body Dressing : Minimal assistance, Sitting Lower Body Dressing: Maximal assistance, Sitting/lateral leans Toilet Transfer: Moderate assistance, +2 for physical assistance, Stand-pivot, BSC Toilet Transfer Details (indicate cue type and reason): Simulated by stand pivot from EOB to chair Functional mobility during ADLs: Moderate assistance, +2 for physical assistance General ADL Comments: Needs cues and physical assist for NWB on LLE.  Cognition: Cognition Overall Cognitive Status: Impaired/Different from baseline Orientation Level: Oriented to person, Oriented to place Cognition Arousal/Alertness: Awake/alert Behavior During Therapy: Flat affect Overall Cognitive Status: Impaired/Different from baseline Area of Impairment: Attention, Safety/judgement, Awareness, Problem solving Current Attention Level: Selective Following Commands: Follows one step commands inconsistently Safety/Judgement: Decreased awareness of safety, Decreased awareness of deficits Awareness: Emergent Problem Solving: Requires verbal cues, Requires tactile cues, Difficulty sequencing General Comments: Daughter reports pt close to baseline cognitively. Repeating himself throghout session and  focused on WB status.  perseverative and some difficulty with processing of activity during session  Blood pressure (!) 150/64, pulse 86, temperature 98.1 F (36.7 C), temperature source Oral, resp. rate 18, height '5\' 6"'$  (1.676 m), weight 71.7 kg (158 lb), SpO2 97 %. Physical Exam  Vitals reviewed. Constitutional: He is oriented to person, place, and time. He appears well-developed and well-nourished.  HENT:  Head: Normocephalic.  Right Ear: External ear normal.  Left Ear: External ear normal.  Eyes: Conjunctivae and EOM are normal.  Neck: Normal range of motion. Neck supple. No thyromegaly present.  Cardiovascular:  Cardiac rate controlled  Respiratory: Effort normal and breath sounds normal.  +Puryear  GI: Soft. Bowel sounds are normal. He exhibits no distension.  Genitourinary:  Genitourinary Comments: Mild scrotal edema  Musculoskeletal: He exhibits edema and tenderness.  Neurological: He is alert and oriented to person, place, and time.  HOH Sensation intact to light touch DTRs symmetric Motor: B/l UE 4+/5  B/l LE HF 2+/5, KE 4/5, ADF/PF 5/5  Skin: Skin is warm and dry.  ORIF site clean and dry with dressing intact  Psychiatric: He has a normal mood and affect. His behavior is normal.    Lab Results Last 24 Hours       Results for orders placed or performed during the hospital encounter of 08/22/16 (from the past 24 hour(s))  CBC with Differential/Platelet     Status: Abnormal   Collection Time: 08/28/16 11:35 AM  Result Value Ref Range   WBC 8.0 4.0 - 10.5 K/uL   RBC 2.71 (L) 4.22 - 5.81 MIL/uL   Hemoglobin 8.3 (L) 13.0 - 17.0 g/dL   HCT 23.7 (L) 39.0 - 52.0 %   MCV 87.5 78.0 - 100.0 fL   MCH 30.6 26.0 - 34.0 pg   MCHC 35.0 30.0 - 36.0 g/dL   RDW 14.9 11.5 - 15.5 %   Platelets 112 (L) 150 - 400 K/uL   Neutrophils Relative % 83 %   Neutro Abs 6.7 1.7 - 7.7 K/uL   Lymphocytes Relative 10 %   Lymphs Abs 0.8 0.7 - 4.0 K/uL   Monocytes Relative 6 %    Monocytes Absolute 0.5 0.1 - 1.0 K/uL   Eosinophils Relative 1 %   Eosinophils Absolute 0.1 0.0 - 0.7 K/uL   Basophils Relative 0 %   Basophils Absolute 0.0 0.0 - 0.1 K/uL  Basic metabolic panel     Status: Abnormal   Collection Time: 08/28/16 11:35 AM  Result Value Ref Range   Sodium 134 (L) 135 - 145 mmol/L   Potassium 3.3 (L) 3.5 - 5.1 mmol/L   Chloride 105 101 - 111 mmol/L   CO2 22 22 - 32 mmol/L   Glucose, Bld 139 (H) 65 - 99 mg/dL   BUN 14 6 - 20 mg/dL   Creatinine, Ser 0.64 0.61 - 1.24 mg/dL   Calcium 7.9 (L) 8.9 - 10.3 mg/dL   GFR calc non Af Amer >60 >60 mL/min   GFR calc Af Amer >60 >60 mL/min   Anion gap 7 5 - 15     Imaging Results (Last 48 hours)  No results found.    Assessment/Plan: Diagnosis: Polytrauma Labs and images independently reviewed.  Records reviewed and summated above.  1. Does the need for close, 24 hr/day medical supervision in concert with the patient's rehab needs make it unreasonable for this patient to be served in a less intensive setting? Potentially 2. Co-Morbidities requiring supervision/potential complications: Acute blood loss anemia (transfuse if necessary to ensure appropriate perfusion for increased activity tolerance), Lung mass ( follow up as outpt), post-op pain management (Biofeedback training with therapies to help reduce reliance on opiate pain medications, monitor pain control during therapies, and sedation at rest and titrate to maximum efficacy to ensure participation and gains in therapies), PVD (cont meds), left carotid surgery, CAD status post CABG (cont meds), atrial fibrillation (cont meds, monitor HR with increased activity), pelvic ring fracture s/p ORIF (transfers only x8 weeks) 3. Due to bladder management, safety, skin/wound care, disease management, pain management and patient education, does the patient require 24 hr/day rehab nursing? Yes 4. Does the patient require coordinated care of a physician,  rehab nurse, PT (1-2 hrs/day, 5 days/week) and OT (1-2 hrs/day, 5 days/week) to address physical and functional deficits in the context  of the above medical diagnosis(es)? Yes Addressing deficits in the following areas: balance, endurance, strength, transferring, bowel/bladder control, bathing, dressing, toileting and psychosocial support 5. Can the patient actively participate in an intensive therapy program of at least 3 hrs of therapy per day at least 5 days per week? Yes 6. The potential for patient to make measurable gains while on inpatient rehab is excellent 7. Anticipated functional outcomes upon discharge from inpatient rehab are Mod I at wheelchair level, supervision for transfers  with PT, Mod I at wheelchair level, supervision for transfers with OT, n/a with SLP. 8. Estimated rehab length of stay to reach the above functional goals is: 4-7 days. 9. Does the patient have adequate social supports and living environment to accommodate these discharge functional goals? Yes 10. Anticipated D/C setting: Home 11. Anticipated post D/C treatments: HH therapy and Home excercise program 12. Overall Rehab/Functional Prognosis: excellent  RECOMMENDATIONS: This patient's condition is appropriate for continued rehabilitative care in the following setting: CIR Patient has agreed to participate in recommended program. Yes Note that insurance prior authorization may be required for reimbursement for recommended care.  Comment: Rehab Admissions Coordinator to follow up.  Delice Lesch, MD, Mellody Drown Cathlyn Parsons., PA-C 08/29/2016    Revision History                        Routing History

## 2016-09-01 NOTE — Care Management Note (Signed)
[facility] Statement of Services  Patient Name:  [name] [name]  Date:  09/01/2016  Welcome to the [facility].  Our goal is to provide you with an individualized program based on your diagnosis and situation, designed to meet your specific needs.  With this comprehensive rehabilitation program, you will be expected to participate in at least 3 hours of rehabilitation therapies Monday-Friday, with modified therapy programming on the weekends.  Your rehabilitation program will include the following services:  Physical Therapy (PT), Occupational Therapy (OT), 24 hour per day rehabilitation nursing, Neuropsychology, Case Management (Social Worker), Rehabilitation Medicine, Nutrition Services and Pharmacy Services  Weekly team conferences will be held on Wednesday to discuss your progress.  Your Social Worker will talk with you frequently to get your input and to update you on team discussions.  Team conferences with you and your family in attendance may also be held.  Expected length of stay: 7-9 Days Overall anticipated outcome: supervision cueing-mod assist with tolieting  Depending on your progress and recovery, your program may change. Your Social Worker will coordinate services and will keep you informed of any changes. Your Social Worker's name and contact numbers are listed  below.  The following services may also be recommended but are not provided by the [facility] will be made to provide these services after discharge if needed.  Arrangements include referral to agencies that provide these services.  Your insurance has been verified to be:  [place] Your primary doctor is:  Baltazar Apo  Pertinent information will be shared with your doctor and your insurance company.  Social Worker:   Ovidio Kin, [place] or ([phone]  Information discussed with and copy given to patient by: Elease Hashimoto, 09/01/2016, 1:04 PM

## 2016-09-01 NOTE — Consult Note (Signed)
CARDIOLOGY CONSULT NOTE       Patient ID: [name] [name] MRN: [id number] DOB/AGE: May 21, 1941 76 y.o.  Admit date: 08/31/2016 Referring Physician:  Posey Pronto Primary Physician: Mickie Hillier, MD Primary Cardiologist: Johnsie Cancel Reason for Consultation: Anticoagulation  Active Problems:   Closed pelvic ring fracture ([place])   Debility   Benign essential HTN   Scrotal edema   Hyperlipidemia   Constipation due to pain medication   Hypoalbuminemia due to protein-calorie malnutrition ([place])   Hypokalemia   Hyponatremia   Urinary retention   Sleep disturbance   Acute bilateral deep vein thrombosis (DVT) of popliteal veins (H[facility])   HPI:  76 y.o. vasculopath. History of AAA repair, CABG, left subclavian stent and bilateral CEA;s EF has been normal with mild MR.  Admitted with fall from roof and pelvic fracture. Duplex today with Bilateral peroneal and posterior tibial thrombosis. Prior to admission patient on DAT for diffuse vascular disease. Currently no dyspnea palpitations or chest pain. Primary issue is bladder retention Foley just removed and pelvic pain control with constipation. He has not had any bleeding issues.  Currently with some confusion and delerium. Wife redirects him .  No acute stroke or other focal neurologic signs Notes indicate PAF but this has not been a recent issue and he was not on anticoagulation on admission.  ROS All other systems reviewed and negative except as noted above  Past Medical History:  Diagnosis Date  . Atrial fibrillation ([name])    no hx of reported at preop visit of 04/21/11   . CAD (coronary artery disease)   . Carotid artery occlusion    left carotid endarterectomy  . Chronic kidney disease    AAA repair with reimplant of renals   . CHRONIC OBSTRUCTIVE PULMONARY DISEASE    pt denied at visit of 04/21/11   . CORONARY ARTERY DISEASE   . Disorder of left sacroiliac joint 08/24/2016  . DIVERTICULOSIS OF COLON   . GERD   . H/O hiatal hernia   .  Headache(784.0)    Hx: of years of years  . HYPERTENSION   . HYPOTHYROIDISM   . JOINT EFFUSION, KNEE   . KNEE, ARTHRITIS, DEGEN./OSTEO    right knee   . Myocardial infarction    1994   . NEPHROLITHIASIS, HX OF   . OSTEOPOROSIS   . Other dysphagia   . PERIPHERAL VASCULAR DISEASE    AAA - 1994 with reimplant of renals   . Peripheral vascular disease ([place])    subclavian stenosis PTA - 3/08   . Pure hypercholesterolemia   . Renal artery stenosis ([facility])     Family History  Problem Relation Age of Onset  . Hypertension Mother   . Heart disease Father   . Heart attack Father   . Cancer Brother     Lung  . Diabetes Sister     Social History   Social History  . Marital status: Married    Spouse name: [name]  . Number of children: [name]  . Years of education: [name]   Occupational History  . Not on file.   Social History Main Topics  . Smoking status: Former Smoker    Types: Cigarettes    Quit date: 06/20/1992  . Smokeless tobacco: Never Used  . Alcohol use No  . Drug use: No  . Sexual activity: Not on file   Other Topics Concern  . Not on file   Social History Narrative  . No narrative on file    Past  Surgical History:  Procedure Laterality Date  . ABDOMINAL AORTIC ANEURYSM REPAIR     with reimplantation of renals   . CARDIAC CATHETERIZATION     2008  . CAROTID ENDARTERECTOMY Left Jan. 30, 2015   CEA  . CHOLECYSTECTOMY  2000   Gall Bladder  . COLONOSCOPY W/ BIOPSIES AND POLYPECTOMY     Hx: of  . CORONARY ARTERY BYPASS GRAFT     1995  . CORONARY STENT PLACEMENT     Hx: of  . ENDARTERECTOMY Left 07/19/2013   Procedure: ENDARTERECTOMY CAROTID;  Surgeon: Mal Misty, MD;  Location: [facility];  Service: Vascular;  Laterality: Left;  . GALLBLADDER SURGERY  2004   . JOINT REPLACEMENT     partial knee replacement on left 2002   . KNEE SURGERY    . ORIF PELVIC FRACTURE Left 08/25/2016   Procedure: OPEN REDUCTION INTERNAL FIXATION (ORIF) PELVIC FRACTURE; plate on front, SI  screw on the back;  Surgeon: Altamese Riverside, MD;  Location: [facility];  Service: Orthopedics;  Laterality: Left;  . OTHER SURGICAL HISTORY     left subclavian stenosis surgery PTA 08/2006   . OTHER SURGICAL HISTORY     carotid surgery on right 2004   . TOTAL KNEE ARTHROPLASTY  04/29/2011   Procedure: TOTAL KNEE ARTHROPLASTY;  Surgeon: Gearlean Alf;  Location: [facility];  Service: Orthopedics;  Laterality: Right;  [name] VASCULAR SURGERY     AAA  . VASECTOMY  1973     . clopidogrel  75 mg Oral Daily  . docusate sodium  100 mg Oral BID  . enoxaparin (LOVENOX) injection  45 mg Subcutaneous Once  . [START ON 09/02/2016] enoxaparin (LOVENOX) injection  1 mg/kg Subcutaneous Q12H  . feeding supplement (PRO-STAT SUGAR FREE 64)  30 mL Oral BID  . levothyroxine  112 mcg Oral QAC breakfast  . lidocaine  1 application Urethral QID  . metoprolol  50 mg Oral BID  . pantoprazole  40 mg Oral Daily  . potassium chloride  20 mEq Oral BID  . rosuvastatin  20 mg Oral q1800  . senna  1 tablet Oral Daily  . tamsulosin  0.4 mg Oral Daily  . triamterene-hydrochlorothiazide  1 tablet Oral Daily     Physical Exam: Blood pressure (!) 159/78, pulse 94, temperature 98.1 F (36.7 C), temperature source Oral, resp. rate 18, height '5\' 6"'$  (1.676 m), weight 185 lb 6.4 oz (84.1 kg), SpO2 98 %.    Confused  Chronically ill white male  HEENT: normal Neck supple with no adenopathy JVP normal bilateral CEA with residual  bruits no thyromegaly Lungs clear with no wheezing and good diaphragmatic motion Heart:  S1/S2 SEM  murmur, no rub, gallop or click PMI normal Abdomen: benighn, BS positve, no tenderness, AAA scar  no bruit.  No HSM or HJR Distal pulses intact with no bruits Plus one bilateral edema Neuro non-focal Skin warm and dry No muscular weakness   Labs:   Lab Results  Component Value Date   WBC 6.9 09/01/2016   HGB 9.2 (L) 09/01/2016   HCT 26.6 (L) 09/01/2016   MCV 87.5 09/01/2016   PLT 185 09/01/2016       Recent Labs Lab 09/01/16 0427  NA 133*  K 3.4*  CL 98*  CO2 27  BUN 14  CREATININE 0.77  CALCIUM 8.2*  PROT 5.1*  BILITOT 1.7*  ALKPHOS 78  ALT 20  AST 33  GLUCOSE 103*   Lab Results  Component Value  Date   CKTOTAL 86 07/21/2007   CKMB 2.6 07/21/2007   TROPONINI 0.01        NO INDICATION OF MYOCARDIAL INJURY. 07/21/2007    Lab Results  Component Value Date   CHOL 117 03/26/2016   CHOL 125 09/26/2015   CHOL 118 03/28/2015   Lab Results  Component Value Date   HDL 45 03/26/2016   HDL 43 09/26/2015   HDL 47 03/28/2015   Lab Results  Component Value Date   LDLCALC 42 03/26/2016   LDLCALC 51 09/26/2015   LDLCALC 46 03/28/2015   Lab Results  Component Value Date   TRIG 152 (H) 03/26/2016   TRIG 157 (H) 09/26/2015   TRIG 123 03/28/2015   Lab Results  Component Value Date   CHOLHDL 2.6 03/26/2016   CHOLHDL 2.9 09/26/2015   CHOLHDL 2.5 03/28/2015   No results found for: LDLDIRECT    Radiology: Ct Head Wo Contrast  Result Date: 08/22/2016 CLINICAL DATA:  76 year old male status post fall from ladder. Extensive pelvic fractures. Abnormal chest CT today revealing a spiculated right middle lobe lung mass. Initial encounter. EXAM: CT HEAD WITHOUT CONTRAST CT CERVICAL SPINE WITHOUT CONTRAST TECHNIQUE: Multidetector CT imaging of the head and cervical spine was performed following the standard protocol without intravenous contrast. Multiplanar CT image reconstructions of the cervical spine were also generated. COMPARISON:  CT chest abdomen and pelvis from today reported separately. [facility] Brain MRI 07/13/2004. FINDINGS: CT HEAD FINDINGS Brain: Rounded mixed hyper and hypodense somewhat masslike area in the anterior left frontal lobe estimated at 2 cm. Adjacent increased white matter hypodensity in a configuration suggestive of mild vasogenic edema. See series 8, image 43. No significant regional mass effect. No second similar lesion identified elsewhere  although there is a punctate perhaps dystrophic calcification in the right frontal lobe at the gray-white matter junction (series 4, image 26). No superimposed intracranial hemorrhage or mass effect. Basilar cisterns are patent. Cavum septum pellucidum variant. No ventriculomegaly. Hypodensity in the right caudate nucleus compatible with chronic small vessel disease. No acute cortically based infarct identified. Vascular: Calcified atherosclerosis at the skull base. No suspicious intracranial vascular hyperdensity. Skull: No skull fracture identified. Bone mineralization appears within normal limits for age. Sinuses/Orbits: Mild to moderate anterior ethmoid and frontoethmoidal recess mucosal thickening. Other paranasal sinuses are clear. The left tympanic cavity and mastoids are clear. There is sclerosis of the right mastoids. There is asymmetric thickening of the right tympanic membrane. Other: Visualized orbit soft tissues are within normal limits. Mild left posterosuperior convexity scalp contusion or hematoma (series 6, image 76) with intact underlying calvarium. No other scout soft tissue injury identified. CT CERVICAL SPINE FINDINGS Alignment: Preserved cervical lordosis. Cervicothoracic junction alignment is within normal limits. There is trace anterolisthesis of C3 on C4 and C4 on C5 with associated chronic facet arthropathy. Bilateral posterior element alignment is within normal limits. Skull base and vertebrae: Visualized skull base is intact. No atlanto-occipital dissociation. No cervical spine fracture identified. There is heterogeneous bone mineralization in the cervical spine, including small lucent lesions in the right C4 (coronal image 31) and left C6 (coronal image 36) levels. Soft tissues and spinal canal: No prevertebral fluid or swelling. No visible canal hematoma. Ligamentous hypertrophy about the odontoid. Calcified carotid and vertebral artery atherosclerosis. Evidence of previous bilateral  carotid endarterectomy. Extensive calcified proximal great vessel atherosclerosis at the visible thoracic inlet. Disc levels: Moderate to severe chronic facet degeneration greater on the right from the C3 to the C6  level. Disc bulging. Multilevel ligament flavum hypertrophy suspected. Up to mild degenerative cervical spinal stenosis at C3-C4. Upper chest: Visible upper thoracic levels appear intact. Chest findings today are reported separately. IMPRESSION: 1. Abnormal anterior left frontal lobe, but the appearance does not resemble typical traumatic injury to the brain and is instead suspicious in this clinical setting for a 2 cm brain mass with mild surrounding vasogenic edema. Brain MRI without and with contrast when possible would characterize further. 2. No other acute intracranial abnormality is evident. Mild chronic small vessel disease in the right basal ganglia. 3.  No acute fracture or listhesis identified in the cervical spine. 4. Heterogeneous bone mineralization in the cervical spine could be degenerative in nature but early osseous metastatic disease is difficult to exclude. The head CT findings were reviewed in person with Dr. Doreen Salvage on 08/22/2016 at 1557 hours. Electronically Signed   By: Genevie Ann M.D.   On: 08/22/2016 16:23   Ct Chest W Contrast  Result Date: 08/22/2016 CLINICAL DATA:  Multiple trauma. The patient fell 12 feet from a ladder. EXAM: CT CHEST, ABDOMEN, AND PELVIS WITH CONTRAST TECHNIQUE: Multidetector CT imaging of the chest, abdomen and pelvis was performed following the standard protocol during bolus administration of intravenous contrast. CONTRAST:  100 cc Omnipaque 300 COMPARISON:  Pelvic radiograph dated 08/22/2016 and chest x-ray dated 08/22/2016 FINDINGS: CT CHEST FINDINGS Cardiovascular: Extensive aortic atherosclerosis. Extensive coronary artery calcifications. Prior CABG. Heart size is normal. Mediastinum/Nodes: 3.7 x 2.3 cm enlarged subcarinal lymph node. Thyroid gland,  trachea, and esophagus demonstrate no significant findings. Lungs/Pleura: There is a spiculated 2.0 x 2.8 cm mass in the right middle lobe with soft tissue stranding to the periphery, consistent with a primary carcinoma the lung. There is a vague 7 mm area of abnormal density in the posterior aspect of the left upper lobe on image 47 of series 3. There are 2 small nodular densities in the right upper lobe on image number 31 of series 3, 1 is 4 mm and the other is 2 mm. Musculoskeletal: No fractures or other acute abnormalities. CT ABDOMEN PELVIS FINDINGS Hepatobiliary: No focal liver abnormality is seen. Status post cholecystectomy. No biliary dilatation. Pancreas: Unremarkable. No pancreatic ductal dilatation or surrounding inflammatory changes. Spleen: No splenic injury or perisplenic hematoma. Adrenals/Urinary Tract: Adrenal glands are unremarkable. Kidneys are normal, without renal calculi, focal lesion, or hydronephrosis. There is retroperitoneal hemorrhage around the left side of the bladder. Tear is factor Maxwell findings no hemorrhage contrecoup never mind anterior to a diameter question MR plaques are Stomach/Bowel: Stomach is within normal limits. Appendix appears normal. No evidence of bowel wall thickening, distention, or inflammatory changes. Vascular/Lymphatic: There is retroperitoneal hemorrhage in the pelvis particularly adjacent to the left common iliac artery. I suspect the bleeding is from the left iliac vein. There is blood in the retroperitoneum anterior to the prostate gland and bladder. No appreciable free intraperitoneal hemorrhage. Hemorrhagic contusion in the left buttock. Reproductive: Slight enlargement of the prostate gland. Other: There is hemorrhage along the left iliopsoas muscle. Musculoskeletal: There is diastases of the symphysis pubis and of the left sacroiliac joint. There are fractures of the left transverse processes of L2 through S1. IMPRESSION: 1. Disruption of the symphysis  pubis and left sacroiliac joint with retroperitoneal hemorrhage in the left side of the pelvis and subcutaneous hemorrhage in the left buttock. I suspect there is an injury to the left external iliac vein. 2. Spiculated mass in the right middle lobe consistent with  carcinoma the lung with metastatic adenopathy in the subcarinal region. 3. Several small pulmonary nodules which are too small to characterize but could represent metastases. 4. Fractures of the left transverse processes of L2 through S1. Electronically Signed   By: Lorriane Shire M.D.   On: 08/22/2016 16:19   Ct Cervical Spine Wo Contrast  Result Date: 08/22/2016 CLINICAL DATA:  76 year old male status post fall from ladder. Extensive pelvic fractures. Abnormal chest CT today revealing a spiculated right middle lobe lung mass. Initial encounter. EXAM: CT HEAD WITHOUT CONTRAST CT CERVICAL SPINE WITHOUT CONTRAST TECHNIQUE: Multidetector CT imaging of the head and cervical spine was performed following the standard protocol without intravenous contrast. Multiplanar CT image reconstructions of the cervical spine were also generated. COMPARISON:  CT chest abdomen and pelvis from today reported separately. [facility] Brain MRI 07/13/2004. FINDINGS: CT HEAD FINDINGS Brain: Rounded mixed hyper and hypodense somewhat masslike area in the anterior left frontal lobe estimated at 2 cm. Adjacent increased white matter hypodensity in a configuration suggestive of mild vasogenic edema. See series 8, image 43. No significant regional mass effect. No second similar lesion identified elsewhere although there is a punctate perhaps dystrophic calcification in the right frontal lobe at the gray-white matter junction (series 4, image 26). No superimposed intracranial hemorrhage or mass effect. Basilar cisterns are patent. Cavum septum pellucidum variant. No ventriculomegaly. Hypodensity in the right caudate nucleus compatible with chronic small vessel disease. No  acute cortically based infarct identified. Vascular: Calcified atherosclerosis at the skull base. No suspicious intracranial vascular hyperdensity. Skull: No skull fracture identified. Bone mineralization appears within normal limits for age. Sinuses/Orbits: Mild to moderate anterior ethmoid and frontoethmoidal recess mucosal thickening. Other paranasal sinuses are clear. The left tympanic cavity and mastoids are clear. There is sclerosis of the right mastoids. There is asymmetric thickening of the right tympanic membrane. Other: Visualized orbit soft tissues are within normal limits. Mild left posterosuperior convexity scalp contusion or hematoma (series 6, image 76) with intact underlying calvarium. No other scout soft tissue injury identified. CT CERVICAL SPINE FINDINGS Alignment: Preserved cervical lordosis. Cervicothoracic junction alignment is within normal limits. There is trace anterolisthesis of C3 on C4 and C4 on C5 with associated chronic facet arthropathy. Bilateral posterior element alignment is within normal limits. Skull base and vertebrae: Visualized skull base is intact. No atlanto-occipital dissociation. No cervical spine fracture identified. There is heterogeneous bone mineralization in the cervical spine, including small lucent lesions in the right C4 (coronal image 31) and left C6 (coronal image 36) levels. Soft tissues and spinal canal: No prevertebral fluid or swelling. No visible canal hematoma. Ligamentous hypertrophy about the odontoid. Calcified carotid and vertebral artery atherosclerosis. Evidence of previous bilateral carotid endarterectomy. Extensive calcified proximal great vessel atherosclerosis at the visible thoracic inlet. Disc levels: Moderate to severe chronic facet degeneration greater on the right from the C3 to the C6 level. Disc bulging. Multilevel ligament flavum hypertrophy suspected. Up to mild degenerative cervical spinal stenosis at C3-C4. Upper chest: Visible upper  thoracic levels appear intact. Chest findings today are reported separately. IMPRESSION: 1. Abnormal anterior left frontal lobe, but the appearance does not resemble typical traumatic injury to the brain and is instead suspicious in this clinical setting for a 2 cm brain mass with mild surrounding vasogenic edema. Brain MRI without and with contrast when possible would characterize further. 2. No other acute intracranial abnormality is evident. Mild chronic small vessel disease in the right basal ganglia. 3.  No acute fracture or  listhesis identified in the cervical spine. 4. Heterogeneous bone mineralization in the cervical spine could be degenerative in nature but early osseous metastatic disease is difficult to exclude. The head CT findings were reviewed in person with Dr. Doreen Salvage on 08/22/2016 at 1557 hours. Electronically Signed   By: Genevie Ann M.D.   On: 08/22/2016 16:23   Ct Abdomen Pelvis W Contrast  Result Date: 08/22/2016 CLINICAL DATA:  Multiple trauma. The patient fell 12 feet from a ladder. EXAM: CT CHEST, ABDOMEN, AND PELVIS WITH CONTRAST TECHNIQUE: Multidetector CT imaging of the chest, abdomen and pelvis was performed following the standard protocol during bolus administration of intravenous contrast. CONTRAST:  100 cc Omnipaque 300 COMPARISON:  Pelvic radiograph dated 08/22/2016 and chest x-ray dated 08/22/2016 FINDINGS: CT CHEST FINDINGS Cardiovascular: Extensive aortic atherosclerosis. Extensive coronary artery calcifications. Prior CABG. Heart size is normal. Mediastinum/Nodes: 3.7 x 2.3 cm enlarged subcarinal lymph node. Thyroid gland, trachea, and esophagus demonstrate no significant findings. Lungs/Pleura: There is a spiculated 2.0 x 2.8 cm mass in the right middle lobe with soft tissue stranding to the periphery, consistent with a primary carcinoma the lung. There is a vague 7 mm area of abnormal density in the posterior aspect of the left upper lobe on image 47 of series 3. There are 2  small nodular densities in the right upper lobe on image number 31 of series 3, 1 is 4 mm and the other is 2 mm. Musculoskeletal: No fractures or other acute abnormalities. CT ABDOMEN PELVIS FINDINGS Hepatobiliary: No focal liver abnormality is seen. Status post cholecystectomy. No biliary dilatation. Pancreas: Unremarkable. No pancreatic ductal dilatation or surrounding inflammatory changes. Spleen: No splenic injury or perisplenic hematoma. Adrenals/Urinary Tract: Adrenal glands are unremarkable. Kidneys are normal, without renal calculi, focal lesion, or hydronephrosis. There is retroperitoneal hemorrhage around the left side of the bladder. Tear is factor Maxwell findings no hemorrhage contrecoup never mind anterior to a diameter question MR plaques are Stomach/Bowel: Stomach is within normal limits. Appendix appears normal. No evidence of bowel wall thickening, distention, or inflammatory changes. Vascular/Lymphatic: There is retroperitoneal hemorrhage in the pelvis particularly adjacent to the left common iliac artery. I suspect the bleeding is from the left iliac vein. There is blood in the retroperitoneum anterior to the prostate gland and bladder. No appreciable free intraperitoneal hemorrhage. Hemorrhagic contusion in the left buttock. Reproductive: Slight enlargement of the prostate gland. Other: There is hemorrhage along the left iliopsoas muscle. Musculoskeletal: There is diastases of the symphysis pubis and of the left sacroiliac joint. There are fractures of the left transverse processes of L2 through S1. IMPRESSION: 1. Disruption of the symphysis pubis and left sacroiliac joint with retroperitoneal hemorrhage in the left side of the pelvis and subcutaneous hemorrhage in the left buttock. I suspect there is an injury to the left external iliac vein. 2. Spiculated mass in the right middle lobe consistent with carcinoma the lung with metastatic adenopathy in the subcarinal region. 3. Several small  pulmonary nodules which are too small to characterize but could represent metastases. 4. Fractures of the left transverse processes of L2 through S1. Electronically Signed   By: Lorriane Shire M.D.   On: 08/22/2016 16:19   Dg Pelvis Portable  Result Date: 08/25/2016 CLINICAL DATA:  Status post open reduction internal fixation of pelvic ring fracture. EXAM: PORTABLE PELVIS 1-2 VIEWS COMPARISON:  Radiographs of same day. FINDINGS: Status post surgical fixation of pubic symphysis. Also noted is surgical screw placement involving the left sacroiliac joint. No  other fracture or dislocation is noted. Hip joints appear normal. IMPRESSION: Status post surgical internal fixation of pubic symphysis and left sacroiliac joint. Electronically Signed   By: Marijo Conception, M.D.   On: 08/25/2016 11:45   Dg Pelvis Portable  Result Date: 08/22/2016 CLINICAL DATA:  Fall from roof.  Right flank and low back pain. EXAM: PORTABLE PELVIS 1-2 VIEWS COMPARISON:  07/13/2004 CT angiogram of the abdomen and pelvis. FINDINGS: There is abnormal widening at the pubic symphysis. There is mild 5 mm elevation of the right pubic bone relative to the left pubic bone at the pubic symphysis. No appreciable widening at the sacroiliac joints. Questionable vertical lucencies in the superior lateral sacral ala bilaterally. No evidence of hip dislocation on this single frontal view. No suspicious focal osseous lesion. Iliofemoral atherosclerosis bilaterally. IMPRESSION: 1. Mild diastasis and offset at the pubic symphysis suspicious for symphyseal disruption. 2. Questionable vertical lucencies in the superolateral sacral ala bilaterally, cannot exclude nondisplaced sacral fractures. No sacroiliac joint diastasis. 3. Recommend correlation with bony protocol pelvis CT. Electronically Signed   By: Ilona Sorrel M.D.   On: 08/22/2016 15:36   Dg Pelvis Comp Min 3v  Result Date: 08/25/2016 CLINICAL DATA:  ORIF pelvic fracture EXAM: [facility]ET PELVIS - 3+ VIEW;  DG C-ARM 61-120 MIN COMPARISON:  None. FLUOROSCOPY TIME:  53 seconds FINDINGS: Six intraoperative fluoroscopic spot images are provided. Interval plate and screw ORIF of symphyseal diastases. Interval placement of a cannulated screw transfixing the left sacroiliac joint. IMPRESSION: 1. Interval ORIF of the pubic symphysis and left SI joint. Electronically Signed   By: Kathreen Devoid   On: 08/25/2016 10:04   Dg Chest Port 1 View  Result Date: 08/22/2016 CLINICAL DATA:  Right flank and lower back pain after falling from a ladder 12 feet. EXAM: PORTABLE CHEST 1 VIEW COMPARISON:  Chest x-ray dated 07/17/2013 FINDINGS: The heart size and pulmonary vascularity are normal and the lungs are clear. CABG. Aortic atherosclerosis. No acute bone abnormality. IMPRESSION: No acute abnormalities.  CABG. Aortic atherosclerosis. Electronically Signed   By: Lorriane Shire M.D.   On: 08/22/2016 15:33   Dg C-arm 1-60 Min  Result Date: 08/25/2016 CLINICAL DATA:  ORIF pelvic fracture EXAM: JUDET PELVIS - 3+ VIEW; DG C-ARM 61-120 MIN COMPARISON:  None. FLUOROSCOPY TIME:  53 seconds FINDINGS: Six intraoperative fluoroscopic spot images are provided. Interval plate and screw ORIF of symphyseal diastases. Interval placement of a cannulated screw transfixing the left sacroiliac joint. IMPRESSION: 1. Interval ORIF of the pubic symphysis and left SI joint. Electronically Signed   By: Kathreen Devoid   On: 08/25/2016 10:04    EKG: SB no acute changes    ASSESSMENT AND PLAN:  Anticoagulation:  Patient needs coumadin for DVT;s He is a diffuse vasculopath with subclavian stent but no coronary stents and no recent stents. Will d/c plavix and continue 81 mg ASA with his coumadin  CAD/CABG:  Stable no angina continue medical Rx  Carotids:  Post CEA ASA last duplex 08/18/15 with 60-79% RICA f/u VVS  AAA repair:  No palpable aorta f/u VVS  Patient has multiple other medical issues including encephalopathy pain control from pelvic  fracture, urinary retention and new brain / lung masses ? Cancer.   SignedJenkins Rouge 09/01/2016, 3:30 PM

## 2016-09-01 NOTE — Progress Notes (Signed)
Occupational Therapy Session Note  Patient Details  Name: [name] [name] MRN: [id number] Date of Birth: [birth year]  Today's Date: 09/01/2016 OT Individual Time: 1500-1700 OT Individual Time Calculation (min): 120 min    Short Term Goals: Week 1:  OT Short Term Goal 1 (Week 1): STGs = LTGs  Skilled Therapeutic Interventions/Progress Updates:    OT treatment session focused on establishing plan of care and bed level there ex. Pt reports just receiving pain medications, with a lot of abdominal discomfort and declines OOB activity. Discussed PLOF and functional OT goals with pt and spouse. LB there-ex including glute sets, hip abduction, and knee extension. Towel roll placed on LLE to decrease hip external rotation. Pt left semi-reclined in bed at end of session with needs met and spouse present.   Therapy Documentation Precautions:  Precautions Precautions: Fall Restrictions Weight Bearing Restrictions: Yes RLE Weight Bearing: Weight bearing as tolerated LLE Weight Bearing: Non weight bearing Other Position/Activity Restrictions: bed to chair only for 8 weeks; WBAT RLE for transfers only Pain: Pain Assessment Pain Assessment: 0-10 Pain Score: 7  Pain Type: Acute pain Pain Location: Penis Pain Descriptors / Indicators: Sore Pain Frequency: Intermittent Pain Onset: On-going Patients Stated Pain Goal: 2 Pain Intervention(s): Repositioned Multiple Pain Sites: No ADL: ADL ADL Comments: refer to functional navigator  See Function Navigator for Current Functional Status.   Therapy/Group: Individual Therapy  [name] 09/01/2016, 4:53 PM

## 2016-09-01 NOTE — Progress Notes (Addendum)
Physical Therapy Session Note  Patient Details  Name: [name] [name] MRN: [id number] Date of Birth: 07-05-1940  Today's Date: 09/01/2016 PT Individual Time: 8786-7672 PT Individual Time Calculation (min): 40 min    Skilled Therapeutic Interventions/Progress Updates:  Treatment initiated with pt seated on bedside commode.  Treatment focused on improving mobility to decrease burden of care.  Pt transferred to w/c with mod A x 2 and then following treatment mod A x 1 w/c to bed.  Pt did well maintaining NWB status in PM and was able to verbalize this restriction correctly to PT.  PT also focused on improving mobility via w/c since pt cannot ambulate at this time.  Pt propelled w/c x approx 150 ft with mod assist from therapist for activity.  Pt has difficulty following commands for w/c propulsion.  Pt left in bed with wife in room, 3 rails up, and alarm set.   Call bell and phone in reach.  Also per nursing, pt has b/l DVT, however, per nursing reports that per PA-C, no restrictions.  Therapy Documentation Precautions:  Precautions Precautions: Fall Restrictions Weight Bearing Restrictions: Yes RLE Weight Bearing: Weight bearing as tolerated LLE Weight Bearing: Non weight bearing Other Position/Activity Restrictions: bed to chair only for 8 weeks; WBAT RLE for transfers only    Other Treatments:  See above   See Function Navigator for Current Functional Status.   Therapy/Group: Individual Therapy  Mavin Dyke Hilario Quarry 09/01/2016, 2:58 PM

## 2016-09-01 NOTE — Progress Notes (Signed)
Social Work Assessment and Plan Social Work Assessment and Plan  Patient Details  Name: [name] [name] MRN: [id number] Date of Birth: 14-May-1941  Today's Date: 09/01/2016  Problem List:  Patient Active Problem List   Diagnosis Date Noted  . Hypoalbuminemia due to protein-calorie malnutrition ([place])   . Hypokalemia   . Hyponatremia   . Urinary retention   . Sleep disturbance   . Acute bilateral deep vein thrombosis (DVT) of popliteal veins ([facility])   . Brain mass 08/31/2016  . Debility   . Benign essential HTN   . Scrotal edema   . Hyperlipidemia   . Constipation due to pain medication   . Fall from height of greater than 3 feet   . Acute blood loss anemia   . Lung mass   . Post-operative pain   . PVD (peripheral vascular disease) ([place])   . Coronary artery disease involving coronary bypass graft of native heart without angina pectoris   . PAF (paroxysmal atrial fibrillation) ([place])   . Disorder of left sacroiliac joint 08/24/2016  . Fall 08/22/2016  . Closed pelvic ring fracture ([place])   . Carotid artery stenosis, asymptomatic 08/12/2014  . Ejection murmur 07/03/2014  . Aftercare following surgery of the circulatory system, [place] 02/11/2014  . Weakness-Abdomin and Bilateral Thigh / Leg 02/11/2014  . Carotid stenosis 07/19/2013  . Occlusion and stenosis of carotid artery without mention of cerebral infarction 07/16/2013  . Impaired fasting glucose 07/14/2013  . Knee pain 05/23/2011  . Stiffness of joint, not elsewhere classified, lower leg 05/23/2011  . Muscle weakness (generalized) 05/23/2011  . Difficulty in walking(719.7) 05/23/2011  . Bruit 11/19/2010  . AAA (abdominal aortic aneurysm) ([place]) 11/19/2010  . OTHER DYSPHAGIA 04/27/2010  . ATRIAL FIBRILLATION 12/10/2008  . JOINT EFFUSION, KNEE 01/14/2008  . KNEE PAIN 01/14/2008  . KNEE, ARTHRITIS, DEGEN./OSTEO 12/11/2007  . Hypothyroidism 10/10/2007  . Pure hypercholesterolemia 10/10/2007  . Essential hypertension 10/10/2007   . Coronary atherosclerosis 10/10/2007  . Peripheral vascular disease ([place]) 10/10/2007  . CHRONIC OBSTRUCTIVE PULMONARY DISEASE 10/10/2007  . GERD 10/10/2007  . DIVERTICULOSIS OF COLON 10/10/2007  . OSTEOPOROSIS 10/10/2007  . NEPHROLITHIASIS, HX OF 10/10/2007  . Personal history of surgery to heart and great vessels, presenting hazards to health 10/10/2007  . CHOLECYSTECTOMY, HX OF 10/10/2007  . CORONARY ARTERY BYPASS GRAFT, HX OF 10/10/2007   Past Medical History:  Past Medical History:  Diagnosis Date  . Atrial fibrillation ([place])    no hx of reported at preop visit of 04/21/11   . CAD (coronary artery disease)   . Carotid artery occlusion    left carotid endarterectomy  . Chronic kidney disease    AAA repair with reimplant of renals   . CHRONIC OBSTRUCTIVE PULMONARY DISEASE    pt denied at visit of 04/21/11   . CORONARY ARTERY DISEASE   . Disorder of left sacroiliac joint 08/24/2016  . DIVERTICULOSIS OF COLON   . GERD   . H/O hiatal hernia   . Headache(784.0)    Hx: of years of years  . HYPERTENSION   . HYPOTHYROIDISM   . JOINT EFFUSION, KNEE   . KNEE, ARTHRITIS, DEGEN./OSTEO    right knee   . Myocardial infarction    1994   . NEPHROLITHIASIS, HX OF   . OSTEOPOROSIS   . Other dysphagia   . PERIPHERAL VASCULAR DISEASE    AAA - 1994 with reimplant of renals   . Peripheral vascular disease ([place])    subclavian stenosis PTA -  3/08   . Pure hypercholesterolemia   . Renal artery stenosis [facility])    Past Surgical History:  Past Surgical History:  Procedure Laterality Date  . ABDOMINAL AORTIC ANEURYSM REPAIR     with reimplantation of renals   . CARDIAC CATHETERIZATION     2008  . CAROTID ENDARTERECTOMY Left Jan. 30, 2015   CEA  . CHOLECYSTECTOMY  2000   Gall Bladder  . COLONOSCOPY W/ BIOPSIES AND POLYPECTOMY     Hx: of  . CORONARY ARTERY BYPASS GRAFT     1995  . CORONARY STENT PLACEMENT     Hx: of  . ENDARTERECTOMY Left 07/19/2013   Procedure: ENDARTERECTOMY  CAROTID;  Surgeon: Mal Misty, MD;  Location: [facility];  Service: Vascular;  Laterality: Left;  . GALLBLADDER SURGERY  2004   . JOINT REPLACEMENT     partial knee replacement on left 2002   . KNEE SURGERY    . ORIF PELVIC FRACTURE Left 08/25/2016   Procedure: OPEN REDUCTION INTERNAL FIXATION (ORIF) PELVIC FRACTURE; plate on front, SI screw on the back;  Surgeon: Altamese Ojai, MD;  Location: [facility];  Service: Orthopedics;  Laterality: Left;  . OTHER SURGICAL HISTORY     left subclavian stenosis surgery PTA 08/2006   . OTHER SURGICAL HISTORY     carotid surgery on right 2004   . TOTAL KNEE ARTHROPLASTY  04/29/2011   Procedure: TOTAL KNEE ARTHROPLASTY;  Surgeon: Gearlean Alf;  Location: [facility];  Service: Orthopedics;  Laterality: Right;  [name] VASCULAR SURGERY     AAA  . VASECTOMY  1973   Social History:  reports that he quit smoking about 24 years ago. His smoking use included Cigarettes. He has never used smokeless tobacco. He reports that he does not drink alcohol or use drugs.  Family / Support Systems Marital Status: Married Patient Roles: Spouse, Parent Spouse/Significant Other: Linda [phone] Children: Heath-son [phone]  Angie-daughter [phone]  Both are local another son out of town Other Supports: Friends and church members Anticipated Caregiver: wife Ability/Limitations of Caregiver: Wife is retired and can assist and plans to be here to observe him in therapies Caregiver Availability: 24/7 Family Dynamics: Close knit with family and friends, both feel they have good supports who will assist if needed and if wife needs a break. Pt wants to be here a short time and get home soon if he can.  Social History Preferred language: English Religion: Methodist Cultural Background: No issues Education: [employer] Read: Yes Write: Yes Employment Status: Retired Freight forwarder Issues: No issues Guardian/Conservator: None-according to MD pt is  not capable of making his own decisions while here, will look toward his wife to make any decisions needed while here.   Abuse/Neglect Physical Abuse: Denies Verbal Abuse: Denies Sexual Abuse: Denies Exploitation of patient/patient's resources: Denies Self-Neglect: Denies  Emotional Status Pt's affect, behavior adn adjustment status: Pt has always been independent and self sufficent and prides himself on this. He is not one to sit still and hates his WB issues and the fact he is stuck in a bed. he knows it will take time to heal and will need to be patient in this process. Wife is very calming to pt and will be here daily to assist him in his recovery process. Recent Psychosocial Issues: other health issues managed before this-now lung issue and will follow up as an OP with oncology Pyschiatric History: No history deferred depressionb screen today due to tired and able to  express his feelings and concerns. May benefit from seeing neuro-psych while here due to accidental finding of lung nodules and seeing oncologist when leaves here. Substance Abuse History: History of tobacco quit 20 years ago  Patient / Family Perceptions, Expectations & Goals Pt/Family understanding of illness & functional limitations: Pt and wife can explain his pelvic and other fractures, both talk with the MD and feel they have a good understanding to the process and WB issues. Pt seems to be somewhat [facility] and asks for you to repeat yourself, will make medical staff aware of this. Premorbid pt/family roles/activities: Husband, father, grandfather, retiree, home owner, church member, etc Anticipated changes in roles/activities/participation: resume Pt/family expectations/goals: Pt states: " I want to be able to do as much as I can before I leave here, I know I have a weight bearing issue, so am limited."  Wife states: " I hope he can transfer from here to there and not be too much to handle."  [employer]: None Premorbid Home Care/DME Agencies: None Transportation available at discharge: Wife and children Resource referrals recommended: Neuropsychology  Discharge Planning Living Arrangements: Spouse/significant other Support Systems: Spouse/significant other, Children, Water engineer, Social worker community Type of Residence: Private residence Insurance underwriter Resources: [employer], Multimedia programmer (specify) Nurse, mental health) Financial Resources: Radio broadcast assistant Screen Referred: No Living Expenses: Own Money Management: Spouse, Patient Does the patient have any problems obtaining your medications?: No Home Management: Wife does the inside work and pt would do the outside work Patient/Family Preliminary Plans: Return home with wife who is able to provide assistance at home. Pt will probably go home wheelchair level due to WB issues. May need a ramp will ask PT and let wife know so has time to do one prior to pt's discharge. Will await tema's evaluations to work on discharge plans. Social Work Anticipated Follow Up Needs: HH/OP, Support Group  Clinical Impression Pleasant gentleman who is on who is used to doing everything for himself and not asking for help from others. He realizes now he had no business on the ladder and does not plan to go up on one again. He has a supportive wife and two local Children who can help some. Wife plans to be here daily and provide emotional support. May need a ramp to get in will find out from PT so can start to work on this. Accidental finding of lung nodules and aware needs to follow up with MD on this. Pt somewhat HOH so need to repeat yourself or make sure he understands what you are asking him to do. Work on discharge needs.  Elease Hashimoto 09/01/2016, 1:01 PM

## 2016-09-01 NOTE — Plan of Care (Signed)
Problem: RH BOWEL ELIMINATION Goal: RH OTHER STG BOWEL ELIMINATION GOALS W/ASSIST Other STG Bowel Elimination Goals With Assistance.  Outcome: Progressing Min.  Problem: RH BLADDER ELIMINATION Goal: RH OTHER STG BLADDER ELIMINATION GOALS W/ASSIST Other STG Bladder Elimination Goals With Assistance  Outcome: Progressing Min. Assist.  Problem: RH SKIN INTEGRITY Goal: RH STG SKIN FREE OF INFECTION/BREAKDOWN Outcome: Progressing With Min. Assist. Goal: RH OTHER STG SKIN INTEGRITY GOALS W/ASSIST Other STG Skin Integrity Goals With Assistance.  Outcome: Progressing With Mod. A  Problem: RH SAFETY Goal: RH STG ADHERE TO SAFETY PRECAUTIONS W/ASSISTANCE/DEVICE STG Adhere to Safety Precautions With Assistance/Device.  Outcome: Progressing With Mod. A Goal: RH STG DECREASED RISK OF FALL WITH ASSISTANCE STG Decreased Risk of Fall With Assistance.  With Mod. A Goal: RH OTHER STG SAFETY GOALS W/ASSIST Other STG Safety Goals With Assistance.  Outcome: Progressing With Mod. A  Problem: RH PAIN MANAGEMENT Goal: RH STG PAIN MANAGED AT OR BELOW PT'S PAIN GOAL Outcome: Progressing Less than 3,on 1 to 10 scale

## 2016-09-01 NOTE — Evaluation (Signed)
Occupational Therapy Assessment and Plan  Patient Details  Name: [name] [name] MRN: [id number] Date of Birth: [birth year]  OT Diagnosis: acute pain and muscle weakness (generalized) Rehab Potential: Rehab Potential (ACUTE ONLY): Good ELOS: 7-9 days   Today's Date: 09/01/2016 OT Individual Time: 0930-1030 OT Individual Time Calculation (min): 60 min     Problem List:  Patient Active Problem List   Diagnosis Date Noted  . Hypoalbuminemia due to protein-calorie malnutrition ([place])   . Hypokalemia   . Hyponatremia   . Urinary retention   . Sleep disturbance   . Acute bilateral deep vein thrombosis (DVT) of popliteal veins (H[facility])   . Brain mass 08/31/2016  . Debility   . Benign essential HTN   . Scrotal edema   . Hyperlipidemia   . Constipation due to pain medication   . Fall from height of greater than 3 feet   . Acute blood loss anemia   . Lung mass   . Post-operative pain   . PVD (peripheral vascular disease) ([place])   . Coronary artery disease involving coronary bypass graft of native heart without angina pectoris   . PAF (paroxysmal atrial fibrillation) ([place])   . Disorder of left sacroiliac joint 08/24/2016  . Fall 08/22/2016  . Closed pelvic ring fracture ([place])   . Carotid artery stenosis, asymptomatic 08/12/2014  . Ejection murmur 07/03/2014  . Aftercare following surgery of the circulatory system, [place] 02/11/2014  . Weakness-Abdomin and Bilateral Thigh / Leg 02/11/2014  . Carotid stenosis 07/19/2013  . Occlusion and stenosis of carotid artery without mention of cerebral infarction 07/16/2013  . Impaired fasting glucose 07/14/2013  . Knee pain 05/23/2011  . Stiffness of joint, not elsewhere classified, lower leg 05/23/2011  . Muscle weakness (generalized) 05/23/2011  . Difficulty in walking(719.7) 05/23/2011  . Bruit 11/19/2010  . AAA (abdominal aortic aneurysm) ([place]) 11/19/2010  . OTHER DYSPHAGIA 04/27/2010  . ATRIAL FIBRILLATION 12/10/2008  . JOINT EFFUSION, KNEE  01/14/2008  . KNEE PAIN 01/14/2008  . KNEE, ARTHRITIS, DEGEN./OSTEO 12/11/2007  . Hypothyroidism 10/10/2007  . Pure hypercholesterolemia 10/10/2007  . Essential hypertension 10/10/2007  . Coronary atherosclerosis 10/10/2007  . Peripheral vascular disease ([place]) 10/10/2007  . CHRONIC OBSTRUCTIVE PULMONARY DISEASE 10/10/2007  . GERD 10/10/2007  . DIVERTICULOSIS OF COLON 10/10/2007  . OSTEOPOROSIS 10/10/2007  . NEPHROLITHIASIS, HX OF 10/10/2007  . Personal history of surgery to heart and great vessels, presenting hazards to health 10/10/2007  . CHOLECYSTECTOMY, HX OF 10/10/2007  . CORONARY ARTERY BYPASS GRAFT, HX OF 10/10/2007    Past Medical History:  Past Medical History:  Diagnosis Date  . Atrial fibrillation ([place])    no hx of reported at preop visit of 04/21/11   . CAD (coronary artery disease)   . Carotid artery occlusion    left carotid endarterectomy  . Chronic kidney disease    AAA repair with reimplant of renals   . CHRONIC OBSTRUCTIVE PULMONARY DISEASE    pt denied at visit of 04/21/11   . CORONARY ARTERY DISEASE   . Disorder of left sacroiliac joint 08/24/2016  . DIVERTICULOSIS OF COLON   . GERD   . H/O hiatal hernia   . Headache(784.0)    Hx: of years of years  . HYPERTENSION   . HYPOTHYROIDISM   . JOINT EFFUSION, KNEE   . KNEE, ARTHRITIS, DEGEN./OSTEO    right knee   . Myocardial infarction    1994   . NEPHROLITHIASIS, HX OF   . OSTEOPOROSIS   . Other dysphagia   .  PERIPHERAL VASCULAR DISEASE    AAA - 1994 with reimplant of renals   . Peripheral vascular disease ([place])    subclavian stenosis PTA - 3/08   . Pure hypercholesterolemia   . Renal artery stenosis [facility])    Past Surgical History:  Past Surgical History:  Procedure Laterality Date  . ABDOMINAL AORTIC ANEURYSM REPAIR     with reimplantation of renals   . CARDIAC CATHETERIZATION     2008  . CAROTID ENDARTERECTOMY Left Jan. 30, 2015   CEA  . CHOLECYSTECTOMY  2000   Gall Bladder  . COLONOSCOPY  W/ BIOPSIES AND POLYPECTOMY     Hx: of  . CORONARY ARTERY BYPASS GRAFT     1995  . CORONARY STENT PLACEMENT     Hx: of  . ENDARTERECTOMY Left 07/19/2013   Procedure: ENDARTERECTOMY CAROTID;  Surgeon: Mal Misty, MD;  Location: [place];  Service: Vascular;  Laterality: Left;  . GALLBLADDER SURGERY  2004   . JOINT REPLACEMENT     partial knee replacement on left 2002   . KNEE SURGERY    . ORIF PELVIC FRACTURE Left 08/25/2016   Procedure: OPEN REDUCTION INTERNAL FIXATION (ORIF) PELVIC FRACTURE; plate on front, SI screw on the back;  Surgeon: Altamese Media, MD;  Location: [place];  Service: Orthopedics;  Laterality: Left;  . OTHER SURGICAL HISTORY     left subclavian stenosis surgery PTA 08/2006   . OTHER SURGICAL HISTORY     carotid surgery on right 2004   . TOTAL KNEE ARTHROPLASTY  04/29/2011   Procedure: TOTAL KNEE ARTHROPLASTY;  Surgeon: Gearlean Alf;  Location: [facility];  Service: Orthopedics;  Laterality: Right;  [name] VASCULAR SURGERY     AAA  . VASECTOMY  1973    Assessment & Plan Clinical Impression:  [name] [name] is a 76 y.o. male with history of PVD with multiple revascularization procedures, left carotid surgery, CAD status post CABG, atrial fibrillation maintained on aspirin and Plavix. History taken from chart review, patient, and wife. Patient lives with spouse. Independent prior to admission. One level home with 2 steps to entry. Presented 08/22/2016 after a fall from a ladder after attempting to clean out his stove pipe. Denied loss of consciousness. Cranial CT scan reviewed, unremarkable for acute process but did show abnormal anterior left frontal lobe appearance did not resemble typical traumatic injury but instead suspicious for a 2 cm brain mass with mild surrounding vasogenic edema.. CT cervical spine negative. CT abdomen and pelvis showed disruption of the symphysis pubis and left sacroiliac joint with retroperitoneal hemorrhage in the left side of the pelvis and  subcutaneous hemorrhage in the left buttocks as well as fractures left transverse process of L2-S1. Also with incidental findings of spiculated mass in the right middle lobe consistent with carcinoma of the lung with metastatic adenopathy in the subcarinal region. Several small pulmonary nodules which are too small to characterize. Underwent open reduction internal fixation of pubic symphysis with sacroiliac screw fixation of left SI joint 08/25/2016 per Dr. Marcelino Scot. Nonweightbearing left lower extremity. Weightbearing as tolerated right lower extremity for transfers only. Bed to chair only for 8 weeks. Hospital course pain management. Lung mass to be evaluated as an outpatient. Acute blood loss anemia 6.9 transfused late hemoglobin 9.2.Foley catheter tube remains in place for scrotal edema. Plan voiding trial. Subcutaneous Lovenox added for DVT prophylaxis 08/29/2016.Physical and occupational therapy evaluations completed with recommendations of physical medicine rehabilitation consult. Patient was admitted for a comprehensive rehabilitation program  Patient transferred to CIR on 08/31/2016 .    Patient currently requires total with lower body basic self-care skills and mod A with squat pivot transfers, max with RW stand pivot secondary to muscle weakness, decreased cardiorespiratoy endurance, decreased visual motor skills, decreased problem solving, decreased memory and delayed processing and decreased standing balance, decreased postural control and difficulty maintaining precautions.  Prior to hospitalization, patient was fully independent, driving, active, cooking, cleaning, etc.  Patient will benefit from skilled intervention to increase independence with basic self-care skills prior to discharge home with care partner.  Anticipate patient will require minimal physical assistance and follow up home health.  OT - End of Session Activity Tolerance: Tolerates < 10 min activity, no significant change in vital  signs Endurance Deficit: Yes Endurance Deficit Description: becomes short of breath easily after just a few minutes of activity OT Assessment Rehab Potential (ACUTE ONLY): Good OT Patient demonstrates impairments in the following area(s): Balance;Cognition;Endurance;Motor;Safety OT Basic ADL's Functional Problem(s): Bathing;Dressing;Toileting OT Transfers Functional Problem(s): Toilet OT Additional Impairment(s): None OT Plan OT Intensity: Minimum of 1-2 x/day, 45 to 90 minutes OT Frequency: 5 out of 7 days OT Duration/Estimated Length of Stay: 7-9 days OT Treatment/Interventions: Balance/vestibular training;Discharge planning;DME/adaptive equipment instruction;Cognitive remediation/compensation;Functional mobility training;Patient/family education;Self Care/advanced ADL retraining;Pain management;Therapeutic Exercise;Therapeutic Activities;UE/LE Strength taining/ROM;Visual/perceptual remediation/compensation OT Self Feeding Anticipated Outcome(s): I OT Basic Self-Care Anticipated Outcome(s): set up UB self care; mod A LB bathing and dressing OT Toileting Anticipated Outcome(s): mod A OT Bathroom Transfers Anticipated Outcome(s): min A with RW to [facility] OT Recommendation Patient destination: Home Follow Up Recommendations: Home health OT Equipment Recommended: None recommended by OT;Other (comment) Equipment Details: family owns 2 [facility]   Skilled Therapeutic Intervention Pt seen for initial evaluation and ADL retraining.  Pt in room with spouse and pt resting in w/c. Discussed purpose of OT, family's plan for full time care, goals of therapy.  Pt very motivated and participates well, but is very deconditioned and becomes short of breath easily.  Discussed how home bathroom is not accessible by w/c and how he could use a BSC in his room to toilet and sponge bathe.  Therapy session focused on safe sit to stand and transfers. From w/c to [facility], it is too difficult to do a squat pivot due to spacing  of the seats.  He could use a squat pivot bed to w/c with mod A.  Pt may benefit from trying a slide board.  Today, worked with a RW for sit to stand and stand pivot. Pt needing mod A with mobility mostly due to cognitive deficits of delayed processing and great difficulty sequencing and problem solving. His wife assisted by making sure he was not putting wt through LLE. Pt did stand pivot to [facility], where he completed bathing and dressing with A.  Did not need to toilet. Discussed goals with pt and wife that OT can focus on safe BSC transfers and sit to stand, but pt will likely need A for cleansing and clothing management as if is very difficult for him to maintain NWB on LLE and sequence the other steps. Pt and wife agreed and felt that was a realistic plan as he will be non wt bearing on L for 8 weeks.  Pt transferred back to w/c, chair alarm set. Wife in room with pt.  OT Evaluation Precautions/Restrictions  Precautions Precautions: Fall Restrictions Weight Bearing Restrictions: Yes RLE Weight Bearing: Weight bearing as tolerated LLE Weight Bearing: Non weight bearing Other Position/Activity Restrictions: bed to  chair only for 8 weeks; WBAT RLE for transfers only  Pain Pain Assessment Pain Assessment: No/denies pain Home Living/Prior Functioning Home Living Available Help at Discharge: Family, Available 24 hours/day Type of Home: House Home Access: Stairs to enter [employer] of Steps: 2 through one way and 6 through other; plan to put ramp where the 2 steps are Entrance Stairs-Rails: Right Home Layout: One level Bathroom Shower/Tub: Tub/shower unit Additional Comments: cant get in bathroom with w/c   Lives With: Spouse IADL History Occupation: Retired Prior Function Level of Independence: Independent with basic ADLs, Independent with homemaking with ambulation, Independent with transfers, Independent with gait  Able to Take Stairs?: Yes Driving: Yes Vocation:  Retired Comments: Heritage manager, cooks, Microbiologist.  ADL ADL ADL Comments: refer to functional navigator Vision/Perception  Vision- History Baseline Vision/History: Wears glasses Wears Glasses: Reading only Patient Visual Report: No change from baseline Vision- Assessment Vision Assessment?: Yes Eye Alignment: Within Functional Limits Ocular Range of Motion: Within Functional Limits Tracking/Visual Pursuits: Impaired - to be further tested in functional context;Other (comment) (unable to follow testing) Saccades: Additional head turns occurred during testing;Decreased speed of saccadic movement  Cognition Overall Cognitive Status: Impaired/Different from baseline Arousal/Alertness: Awake/alert Orientation Level: Person;Place;Situation Person: Oriented Place: Oriented Situation: Oriented Year: 2018 Month: March Day of Week: Other (Comment) (did not know) Memory: Impaired Memory Impairment: Decreased recall of new information;Decreased short term memory Decreased Short Term Memory: Verbal basic;Functional basic Immediate Memory Recall: Sock;Blue;Bed Memory Recall: Sock;Blue;Bed Memory Recall Sock: Without Cue Memory Recall Blue: With Cue Memory Recall Bed: With Cue Problem Solving: Impaired Problem Solving Impairment: Functional basic Executive Function: Sequencing Sequencing: Impaired Sequencing Impairment: Functional basic Safety/Judgment: Impaired Comments: pt's wife reports prior to fall, pt has some mild "forgetfullness" but her and family have noticed a big change in his overall cognition Sensation Sensation Light Touch: Appears Intact Stereognosis: Appears Intact Hot/Cold: Appears Intact Proprioception: Appears Intact Coordination Gross Motor Movements are Fluid and Coordinated: No Fine Motor Movements are Fluid and Coordinated: Yes Coordination and Movement Description: limited by weakness Finger Nose Finger Test: not tested Motor  Motor Motor: Abnormal postural  alignment and control;Other (comment) Motor - Skilled Clinical Observations: generalized motor weakness Mobility    refer to functional navigator Trunk/Postural Assessment  Cervical Assessment Cervical Assessment: Within Functional Limits Thoracic Assessment Thoracic Assessment: Within Functional Limits Lumbar Assessment Lumbar Assessment: Within Functional Limits Postural Control Postural Control: Deficits on evaluation (functional in sitting, in standing forward lean over walker)  Balance Balance Balance Assessed: Yes Static Sitting Balance Static Sitting - Level of Assistance: 5: Stand by assistance Dynamic Sitting Balance Dynamic Sitting - Level of Assistance: 5: Stand by assistance Static Standing Balance Static Standing - Level of Assistance: 4: Min assist (with BUE on RW) Dynamic Standing Balance Dynamic Standing - Level of Assistance: 4: Min assist;3: Mod assist (difficulty maintaining WB status) Extremity/Trunk Assessment RUE Assessment RUE Assessment: Exceptions to [facility] RUE Strength RUE Overall Strength: Deficits (4-/5) LUE Assessment LUE Assessment: Exceptions to [facility] LUE Strength LUE Overall Strength Comments: 4-/5   See Function Navigator for Current Functional Status.   Refer to Care Plan for Long Term Goals  Recommendations for other services: None    Discharge Criteria: Patient will be discharged from OT if patient refuses treatment 3 consecutive times without medical reason, if treatment goals not met, if there is a change in medical status, if patient makes no progress towards goals or if patient is discharged from hospital.  The above assessment, treatment plan, treatment  alternatives and goals were discussed and mutually agreed upon: by patient and by family  Dustin Burrill 09/01/2016, 12:22 PM

## 2016-09-01 NOTE — Progress Notes (Signed)
Vascular labs returned showing acute DVT noted bilateral posterior tibial and peroneal veins. Will increase Lovenox to 1 mg/kg and discuss plan on anticoagulation.

## 2016-09-01 NOTE — IPOC Note (Signed)
Overall Plan of Care [facility]) Patient Details Name: [name] [name] MRN: [id number] DOB: Oct 21, 1940  Admitting Diagnosis: [facility] Problems: Active Problems:   Closed pelvic ring fracture ([place])   Debility   Benign essential HTN   Scrotal edema   Hyperlipidemia   Constipation due to pain medication   Hypoalbuminemia due to protein-calorie malnutrition (H[facility])   Hypokalemia   Hyponatremia   Urinary retention   Sleep disturbance   Acute bilateral deep vein thrombosis (DVT) of popliteal veins (H[facility])     Functional Problem List: Nursing Behavior, Bladder, Bowel, Edema, Endurance, Medication Management, Nutrition, Pain, Perception, Safety, Skin Integrity, Sensory, Motor  PT Balance, Edema, Endurance, Motor, Pain, Safety, Skin Integrity  OT Balance, Cognition, Endurance, Motor, Safety  SLP    TR         Basic ADL's: OT Bathing, Dressing, Toileting     Advanced  ADL's: OT       Transfers: PT Bed Mobility, Bed to Chair, Car, Chief Operating Officer: PT Ambulation, Emergency planning/management officer, Stairs     Additional Impairments: OT None  SLP        TR      Anticipated Outcomes Item Anticipated Outcome  Self Feeding I  Swallowing      Basic self-care  set up UB self care; mod A LB bathing and dressing  Toileting  mod A   Bathroom Transfers min A with RW to [facility]  Bowel/Bladder  min assist  Transfers  supervision   Locomotion  supervision w/c mobility  Communication     Cognition     Pain  less<3  Safety/Judgment  min assist   Therapy Plan: PT Intensity: Minimum of 1-2 x/day ,45 to 90 minutes PT Frequency: 5 out of 7 days PT Duration Estimated Length of Stay: 7-9 days OT Intensity: Minimum of 1-2 x/day, 45 to 90 minutes OT Frequency: 5 out of 7 days OT Duration/Estimated Length of Stay: 7-9 days         Team Interventions: Nursing Interventions Disease Management/Prevention, Patient/Family Education, Bladder Management, Bowel Management,  Pain Management, Medication Management, Skin Care/Wound Management, Dysphagia/Aspiration Precaution Training, Discharge Planning, Psychosocial Support, Cognitive Remediation/Compensation  PT interventions Balance/vestibular training, Cognitive remediation/compensation, Discharge planning, Disease management/prevention, DME/adaptive equipment instruction, Functional mobility training, Neuromuscular re-education, Pain management, Patient/family education, Psychosocial support, Skin care/wound management, Splinting/orthotics, Therapeutic Activities, UE/LE Strength taining/ROM, UE/LE Coordination activities, Therapeutic Exercise, Wheelchair propulsion/positioning  OT Interventions Balance/vestibular training, Discharge planning, DME/adaptive equipment instruction, Cognitive remediation/compensation, Functional mobility training, Patient/family education, Self Care/advanced ADL retraining, Pain management, Therapeutic Exercise, Therapeutic Activities, UE/LE Strength taining/ROM, Visual/perceptual remediation/compensation  SLP Interventions    TR Interventions    SW/CM Interventions Discharge Planning, Psychosocial Support, Patient/Family Education    Team Discharge Planning: Destination: PT-Home ,OT- Home , SLP-  Projected Follow-up: PT-Home health PT, 24 hour supervision/assistance, OT-  Home health OT, SLP-  Projected Equipment Needs: PT-Wheelchair (measurements), Wheelchair cushion (measurements), OT- None recommended by OT, Other (comment), SLP-  Equipment Details: PT- , OT-family owns 2 BSC Patient/family involved in discharge planning: PT- Patient, Family member/caregiver,  OT-Patient, Family member/caregiver, SLP-   MD ELOS: 7-10 days.  Medical Rehab Prognosis:  Good Assessment: 76 y.o.malewith history of PVD with multiple revascularization procedures, left carotid surgery, CAD status post CABG, atrial fibrillation maintained on aspirin and Plavix. Presented 08/22/2016 after a fall from a ladder  after attempting to clean out his stove pipe. Denied loss of consciousness. Cranial CT scan reviewed, unremarkable for acute process  but did show abnormal anterior left frontal lobe appearance did not resemble typical traumatic injury but instead suspicious for a 2 cm brain mass with mild surrounding vasogenic edema.. CT cervical spine negative. CT abdomen and pelvis showed disruption of the symphysis pubis and left sacroiliac joint with retroperitoneal hemorrhage in the left side of the pelvis and subcutaneous hemorrhage in the left buttocks as well as fractures left transverse process of L2-S1. Also with incidental findings of spiculated mass in the right middle lobe consistent with carcinoma of the lung with metastatic adenopathy in the subcarinalregion. Several small pulmonary nodules which are too small to characterize. Underwent open reduction internal fixation of pubic symphysis with sacroiliac screw fixation of left SI joint 08/25/2016 per Dr. Marcelino Scot. Nonweightbearing left lower extremity. Weightbearing as tolerated right lower extremity for transfers only. Bed to chair only for 8 weeks. Hospital course pain management. Lung mass to be evaluated as an outpatient. Acute blood loss anemia transfused. Foley catheter tube for scrotal edema d/ced, voiding trial. Pt with resulting functional deficits with mobility, transfers, self- care.  Will set goals for Min A/Supervision with most tasks with PT/OT.    See Team Conference Notes for weekly updates to the plan of care

## 2016-09-02 ENCOUNTER — Inpatient Hospital Stay (HOSPITAL_COMMUNITY): Payer: Medicare Other

## 2016-09-02 ENCOUNTER — Telehealth: Payer: Self-pay | Admitting: Radiation Therapy

## 2016-09-02 ENCOUNTER — Inpatient Hospital Stay (HOSPITAL_COMMUNITY): Payer: Medicare Other | Admitting: Occupational Therapy

## 2016-09-02 ENCOUNTER — Inpatient Hospital Stay (HOSPITAL_COMMUNITY): Payer: Medicare Other | Admitting: Speech Pathology

## 2016-09-02 DIAGNOSIS — I824Y9 Acute embolism and thrombosis of unspecified deep veins of unspecified proximal lower extremity: Secondary | ICD-10-CM

## 2016-09-02 DIAGNOSIS — R8299 Other abnormal findings in urine: Secondary | ICD-10-CM

## 2016-09-02 DIAGNOSIS — R0602 Shortness of breath: Secondary | ICD-10-CM

## 2016-09-02 DIAGNOSIS — R8290 Unspecified abnormal findings in urine: Secondary | ICD-10-CM

## 2016-09-02 DIAGNOSIS — R590 Localized enlarged lymph nodes: Secondary | ICD-10-CM

## 2016-09-02 DIAGNOSIS — I2510 Atherosclerotic heart disease of native coronary artery without angina pectoris: Secondary | ICD-10-CM

## 2016-09-02 DIAGNOSIS — K5909 Other constipation: Secondary | ICD-10-CM

## 2016-09-02 DIAGNOSIS — R911 Solitary pulmonary nodule: Secondary | ICD-10-CM

## 2016-09-02 DIAGNOSIS — R32 Unspecified urinary incontinence: Secondary | ICD-10-CM

## 2016-09-02 DIAGNOSIS — D72829 Elevated white blood cell count, unspecified: Secondary | ICD-10-CM

## 2016-09-02 DIAGNOSIS — J189 Pneumonia, unspecified organism: Secondary | ICD-10-CM

## 2016-09-02 LAB — URINALYSIS, COMPLETE (UACMP) WITH MICROSCOPIC
Bilirubin Urine: NEGATIVE
Glucose, UA: NEGATIVE mg/dL
KETONES UR: NEGATIVE mg/dL
NITRITE: NEGATIVE
PROTEIN: 30 mg/dL — AB
SPECIFIC GRAVITY, URINE: 1.009 (ref 1.005–1.030)
Squamous Epithelial / LPF: NONE SEEN
pH: 7 (ref 5.0–8.0)

## 2016-09-02 LAB — CBC
HCT: 28.5 % — ABNORMAL LOW (ref 39.0–52.0)
HEMOGLOBIN: 9.6 g/dL — AB (ref 13.0–17.0)
MCH: 29.5 pg (ref 26.0–34.0)
MCHC: 33.7 g/dL (ref 30.0–36.0)
MCV: 87.7 fL (ref 78.0–100.0)
Platelets: 250 10*3/uL (ref 150–400)
RBC: 3.25 MIL/uL — ABNORMAL LOW (ref 4.22–5.81)
RDW: 15.6 % — ABNORMAL HIGH (ref 11.5–15.5)
WBC: 10.6 10*3/uL — ABNORMAL HIGH (ref 4.0–10.5)

## 2016-09-02 MED ORDER — CEFTRIAXONE SODIUM 500 MG IJ SOLR
250.0000 mg | INTRAMUSCULAR | Status: DC
  Filled 2016-09-02: qty 500

## 2016-09-02 MED ORDER — GADOBENATE DIMEGLUMINE 529 MG/ML IV SOLN
18.0000 mL | Freq: Once | INTRAVENOUS | Status: AC | PRN
  Administered 2016-09-02: 18 mL via INTRAVENOUS

## 2016-09-02 MED ORDER — COUMADIN BOOK
Freq: Once | Status: AC
  Administered 2016-09-02: 08:00:00
  Filled 2016-09-02: qty 1

## 2016-09-02 MED ORDER — DEXTROSE 5 % IV SOLN
1.0000 g | INTRAVENOUS | Status: AC
  Administered 2016-09-02 – 2016-09-09 (×8): 1 g via INTRAVENOUS
  Filled 2016-09-02 (×8): qty 10

## 2016-09-02 MED ORDER — SODIUM CHLORIDE 0.9 % IV SOLN
Freq: Once | INTRAVENOUS | Status: AC
  Administered 2016-09-02: 500 mL via INTRAVENOUS

## 2016-09-02 MED ORDER — METOPROLOL TARTRATE 50 MG PO TABS
75.0000 mg | ORAL_TABLET | Freq: Two times a day (BID) | ORAL | Status: DC
  Administered 2016-09-03: 75 mg via ORAL
  Filled 2016-09-02 (×5): qty 1

## 2016-09-02 MED ORDER — ACETAMINOPHEN 650 MG RE SUPP
650.0000 mg | RECTAL | Status: DC | PRN
  Administered 2016-09-02: 650 mg via RECTAL
  Filled 2016-09-02: qty 1

## 2016-09-02 MED ORDER — FUROSEMIDE 40 MG PO TABS: 40.0000 mg | ORAL_TABLET | Freq: Every day | ORAL | Status: DC

## 2016-09-02 MED ORDER — KCL IN DEXTROSE-NACL 20-5-0.45 MEQ/L-%-% IV SOLN
INTRAVENOUS | Status: DC
  Administered 2016-09-03 – 2016-09-04 (×3): via INTRAVENOUS
  Filled 2016-09-02 (×3): qty 1000

## 2016-09-02 MED ORDER — WARFARIN SODIUM 5 MG PO TABS
5.0000 mg | ORAL_TABLET | Freq: Once | ORAL | Status: AC
  Administered 2016-09-02: 5 mg via ORAL
  Filled 2016-09-02 (×2): qty 1

## 2016-09-02 MED ORDER — FUROSEMIDE 10 MG/ML IJ SOLN
40.0000 mg | Freq: Once | INTRAMUSCULAR | Status: AC
  Administered 2016-09-02: 40 mg via INTRAVENOUS
  Filled 2016-09-02: qty 4

## 2016-09-02 MED ORDER — ALPRAZOLAM 0.25 MG PO TABS
0.2500 mg | ORAL_TABLET | Freq: Three times a day (TID) | ORAL | Status: DC | PRN
  Administered 2016-09-02 – 2016-09-07 (×5): 0.25 mg via ORAL
  Filled 2016-09-02 (×6): qty 1

## 2016-09-02 MED ORDER — WARFARIN - PHARMACIST DOSING INPATIENT: Freq: Every day | Status: DC

## 2016-09-02 NOTE — Progress Notes (Signed)
[facility] PHYSICAL MEDICINE & REHABILITATION     PROGRESS NOTE  Subjective/Complaints:  Pt seen laying in bed this AM.  He was confused overnight per wife.  Patient complains of burning urination this AM.  Wife as questions about confusion.   ROS: Denies CP, SOB, N/V/D.  Objective: Vital Signs: Blood pressure (!) 155/65, pulse 74, temperature 97.8 F (36.6 C), temperature source Oral, resp. rate 18, height '5\' 6"'$  (1.676 m), weight 84.1 kg (185 lb 6.4 oz), SpO2 95 %. No results found.  Recent Labs  09/01/16 0427 09/02/16 0608  WBC 6.9 10.6*  HGB 9.2* 9.6*  HCT 26.6* 28.5*  PLT 185 250    Recent Labs  09/01/16 0427  NA 133*  K 3.4*  CL 98*  GLUCOSE 103*  BUN 14  CREATININE 0.77  CALCIUM 8.2*   CBG (last 3)  No results for input(s): GLUCAP in the last 72 hours.  Wt Readings from Last 3 Encounters:  08/31/16 84.1 kg (185 lb 6.4 oz)  08/22/16 71.7 kg (158 lb)  08/18/16 71.8 kg (158 lb 6 oz)    Physical Exam:  BP (!) 155/65 (BP Location: Left Arm)   Pulse 74   Temp 97.8 F (36.6 C) (Oral)   Resp 18   Ht '5\' 6"'$  (1.676 m)   Wt 84.1 kg (185 lb 6.4 oz)   SpO2 95%   BMI 29.92 kg/m  Constitutional: He appears well-developed and well-nourished.  HENT: Normocephalic and atraumatic.  Eyes: EOMI. No discharge.  Cardiovascular: RRR. No JVD. Respiratory: Effort normal. Clear GI: Soft. Bowel sounds are normal.  Musculoskeletal: He exhibits edema and tenderness.  GU: Scrotal edema Neurological: He is alert.  HOH A&Ox2 with cues and increased time, confused Motor: RUE 4+/5 proximal to distal LUE: 4/5 proximal to distal RLE: 3-/5 proximal to distal LLE: 3/5 HF, 3+/5 KE, 4-/5 ADF/PF  Skin: Skin is warm and dry. Dressing to ORIF c/d/i  Psychiatric: His mood appears anxious. He is slowed.   Assessment/Plan: 1. Functional deficits secondary to closed pelvic ring fracture with retroperitoneal hemorrhage in the left pelvis s/p ORIF of SI after fall which require 3+ hours  per day of interdisciplinary therapy in a comprehensive inpatient rehab setting. Physiatrist is providing close team supervision and 24 hour management of active medical problems listed below. Physiatrist and rehab team continue to assess barriers to discharge/monitor patient progress toward functional and medical goals.  Function:  Bathing Bathing position   Position: Other (comment) (sitting on BSC)  Bathing parts Body parts bathed by patient: Right arm, Left arm, Chest, Abdomen, Front perineal area, Right upper leg, Left upper leg Body parts bathed by helper: Buttocks, Right lower leg, Left lower leg, Back  Bathing assist        Upper Body Dressing/Undressing Upper body dressing   What is the patient wearing?: Pull over shirt/dress     Pull over shirt/dress - Perfomed by patient: Thread/unthread right sleeve, Thread/unthread left sleeve, Put head through opening, Pull shirt over trunk          Upper body assist Assist Level: Supervision or verbal cues      Lower Body Dressing/Undressing Lower body dressing   What is the patient wearing?: Pants, Non-skid slipper socks       Pants- Performed by helper: Thread/unthread right pants leg, Thread/unthread left pants leg, Pull pants up/down                      Lower body assist  Assist for lower body dressing: 2 Helpers      Toileting Toileting     Toileting steps completed by helper: Performs perineal hygiene, Adjust clothing after toileting, Adjust clothing prior to toileting (practiced steps as pt as catheter and did not need to have a BM)    Toileting assist Assist level: Touching or steadying assistance (Pt.75%) (pt used B arms to push up on BSC armrests)   Transfers Chair/bed transfer   Chair/bed transfer method: Squat pivot Chair/bed transfer assist level: Moderate assist (Pt 50 - 74%/lift or lower) Chair/bed transfer assistive device: Walker, Air cabin crew Ambulation activity did  not occur: Safety/medical concerns (orders for transfers only)         Wheelchair   Type: Manual Max wheelchair distance: 50' Assist Level: Touching or steadying assistance (Pt > 75%) (max cues)  Cognition Comprehension Comprehension assist level: Understands basic 50 - 74% of the time/ requires cueing 25 - 49% of the time  Expression Expression assist level: Expresses basic 75 - 89% of the time/requires cueing 10 - 24% of the time. Needs helper to occlude trach/needs to repeat words.  Social Interaction Social Interaction assist level: Interacts appropriately 75 - 89% of the time - Needs redirection for appropriate language or to initiate interaction.  Problem Solving Problem solving assist level: Solves basic 25 - 49% of the time - needs direction more than half the time to initiate, plan or complete simple activities  Memory Memory assist level: Recognizes or recalls 25 - 49% of the time/requires cueing 50 - 75% of the time    Medical Problem List and Plan: 1.  Decreased functional mobility secondary to Closed pelvic ring fracture with disorder of left sacroiliac joint with retroperitoneal hemorrhage in the left side of the pelvis with subcutaneous hemorrhage in the left buttocks after fall status post ORIF with sacroiliac screw fixation of left SI joint 08/25/2016. Weightbearing as tolerated right lower extremity for transfers only nonweightbearing left lower extremity  Cont CIR 2.  Acute DVT:   Subcutaneous Lovenox and coumadin initiated 3/14.   Vascular study with b/l peroneal and tibial veins  Monitor for any bleeding episodes 3. Pain Management: Oxycodone and Percocet as needed 4. Mood: Provide emotional support 5. Neuropsych: This patient is not capable of making decisions on his own behalf. 6. Skin/Wound Care: Routine skin checks 7. Fluids/Electrolytes/Nutrition: Routine I&Os 8. Acute blood loss anemia.   Hb 9.6 on 3/16  Labs ordered for Monday  Cont to monitor 9.  Hypertension.   Lopressor 50 mg twice a day, increased to 75 on 3/16  Maxzide 37.5.-25 milligrams daily 10. CAD status post CABG. No chest pain or shortness of breath. Plavix resumed.   Now on ASA and Coumadin 11. Atrial fibrillation. Cardiac rate controlled. Continue Lopressor. 12. PVD with multiple revascularization procedures. Continue Plavix. 13. Right middle lobe lung/brain mass. Plan further evaluation as outpatient. 14. Scrotal edema  Foley d/ced, voiding trial, incontinent  Continue Flomax 15. Hypothyroidism. Synthroid 16. Hyperlipidemia. Crestor 17. Constipation. Laxative assistance 18. Hyponatremia  Na+ 133 on 3/15  Cont to monitor  Labs ordered for Monday 19. Hypokalemia  K+ 3.4 on 3/15  Supplemented on 3/15  Cont to monitor  Labs ordered for Monday 20. Hypoalbuminemia  Supplement initiated 3/15 21. Sleep disturbance  Trazodone PRN started 3/15 22. Leukocytosis  WBCs 10.6 on 3/16  UA/Ucx ordered  Labs ordered for Monday 23. Confusion  Multifactorial ?UTI +/- meds, +/- sleep  UA/Ucx ordered  LOS (  Days) 2 A FACE TO FACE EVALUATION WAS PERFORMED  Julian Askin Lorie Phenix 09/02/2016 8:28 AM

## 2016-09-02 NOTE — Progress Notes (Signed)
Pt. Had a panic attack,BP 157/106.P=111.Dan PAC was notified.Orders received.Keep monitoring pt. Closely.

## 2016-09-02 NOTE — Evaluation (Signed)
Speech Language Pathology Assessment and Plan  Patient Details  Name: [name] [name] MRN: [id number] Date of Birth: [birth year]  SLP Diagnosis: Cognitive Impairments  Rehab Potential: Good ELOS: 7 to 9 days    Today's Date: 09/02/2016 SLP Individual Time: 1000-1100 SLP Individual Time Calculation (min): 60 min   Problem List:  Patient Active Problem List   Diagnosis Date Noted  . Urinary incontinence   . Abnormal urinalysis   . Leukocytosis   . Hypoalbuminemia due to protein-calorie malnutrition ([place])   . Hypokalemia   . Hyponatremia   . Urinary retention   . Sleep disturbance   . Acute bilateral deep vein thrombosis (DVT) of popliteal veins ([facility])   . Brain mass 08/31/2016  . Debility   . Benign essential HTN   . Scrotal edema   . Hyperlipidemia   . Constipation due to pain medication   . Fall from height of greater than 3 feet   . Acute blood loss anemia   . Lung mass   . Post-operative pain   . PVD (peripheral vascular disease) ([place])   . Coronary artery disease involving coronary bypass graft of native heart without angina pectoris   . PAF (paroxysmal atrial fibrillation) ([place])   . Disorder of left sacroiliac joint 08/24/2016  . Fall 08/22/2016  . Closed pelvic ring fracture ([place])   . Carotid artery stenosis, asymptomatic 08/12/2014  . Ejection murmur 07/03/2014  . Aftercare following surgery of the circulatory system, [place] 02/11/2014  . Weakness-Abdomin and Bilateral Thigh / Leg 02/11/2014  . Carotid stenosis 07/19/2013  . Occlusion and stenosis of carotid artery without mention of cerebral infarction 07/16/2013  . Impaired fasting glucose 07/14/2013  . Knee pain 05/23/2011  . Stiffness of joint, not elsewhere classified, lower leg 05/23/2011  . Muscle weakness (generalized) 05/23/2011  . Difficulty in walking(719.7) 05/23/2011  . Bruit 11/19/2010  . AAA (abdominal aortic aneurysm) ([place]) 11/19/2010  . OTHER DYSPHAGIA 04/27/2010  . ATRIAL FIBRILLATION  12/10/2008  . JOINT EFFUSION, KNEE 01/14/2008  . KNEE PAIN 01/14/2008  . KNEE, ARTHRITIS, DEGEN./OSTEO 12/11/2007  . Hypothyroidism 10/10/2007  . Pure hypercholesterolemia 10/10/2007  . Essential hypertension 10/10/2007  . Coronary atherosclerosis 10/10/2007  . Peripheral vascular disease ([place]) 10/10/2007  . CHRONIC OBSTRUCTIVE PULMONARY DISEASE 10/10/2007  . GERD 10/10/2007  . DIVERTICULOSIS OF COLON 10/10/2007  . OSTEOPOROSIS 10/10/2007  . NEPHROLITHIASIS, HX OF 10/10/2007  . Personal history of surgery to heart and great vessels, presenting hazards to health 10/10/2007  . CHOLECYSTECTOMY, HX OF 10/10/2007  . CORONARY ARTERY BYPASS GRAFT, HX OF 10/10/2007   Past Medical History:  Past Medical History:  Diagnosis Date  . Atrial fibrillation ([place])    no hx of reported at preop visit of 04/21/11   . CAD (coronary artery disease)   . Carotid artery occlusion    left carotid endarterectomy  . Chronic kidney disease    AAA repair with reimplant of renals   . CHRONIC OBSTRUCTIVE PULMONARY DISEASE    pt denied at visit of 04/21/11   . CORONARY ARTERY DISEASE   . Disorder of left sacroiliac joint 08/24/2016  . DIVERTICULOSIS OF COLON   . GERD   . H/O hiatal hernia   . Headache(784.0)    Hx: of years of years  . HYPERTENSION   . HYPOTHYROIDISM   . JOINT EFFUSION, KNEE   . KNEE, ARTHRITIS, DEGEN./OSTEO    right knee   . Myocardial infarction    1994   . NEPHROLITHIASIS, HX OF   .  OSTEOPOROSIS   . Other dysphagia   . PERIPHERAL VASCULAR DISEASE    AAA - 1994 with reimplant of renals   . Peripheral vascular disease ([place])    subclavian stenosis PTA - 3/08   . Pure hypercholesterolemia   . Renal artery stenosis [facility])    Past Surgical History:  Past Surgical History:  Procedure Laterality Date  . ABDOMINAL AORTIC ANEURYSM REPAIR     with reimplantation of renals   . CARDIAC CATHETERIZATION     2008  . CAROTID ENDARTERECTOMY Left Jan. 30, 2015   CEA  . CHOLECYSTECTOMY   2000   Gall Bladder  . COLONOSCOPY W/ BIOPSIES AND POLYPECTOMY     Hx: of  . CORONARY ARTERY BYPASS GRAFT     1995  . CORONARY STENT PLACEMENT     Hx: of  . ENDARTERECTOMY Left 07/19/2013   Procedure: ENDARTERECTOMY CAROTID;  Surgeon: Mal Misty, MD;  Location: [facility];  Service: Vascular;  Laterality: Left;  . GALLBLADDER SURGERY  2004   . JOINT REPLACEMENT     partial knee replacement on left 2002   . KNEE SURGERY    . ORIF PELVIC FRACTURE Left 08/25/2016   Procedure: OPEN REDUCTION INTERNAL FIXATION (ORIF) PELVIC FRACTURE; plate on front, SI screw on the back;  Surgeon: Altamese Sweetwater, MD;  Location: [facility];  Service: Orthopedics;  Laterality: Left;  . OTHER SURGICAL HISTORY     left subclavian stenosis surgery PTA 08/2006   . OTHER SURGICAL HISTORY     carotid surgery on right 2004   . TOTAL KNEE ARTHROPLASTY  04/29/2011   Procedure: TOTAL KNEE ARTHROPLASTY;  Surgeon: Gearlean Alf;  Location: [facility];  Service: Orthopedics;  Laterality: Right;  [name] VASCULAR SURGERY     AAA  . VASECTOMY  1973    Assessment / Plan / Recommendation Clinical Impression [name] [name] a 76 y.o.malewith history of PVD with multiple revascularization procedures, left carotid surgery, CAD status post CABG, atrial fibrillation maintained on aspirin and Plavix. History taken from chart review, patient, and wife. Patient lives with spouse. Independent prior to admission. One level home with 2 steps to entry. Presented 08/22/2016 after a fall from a ladder after attempting to clean out his stove pipe. Denied loss of consciousness. Cranial CT scan reviewed, unremarkable for acute process but did show abnormal anterior left frontal lobe appearance did not resemble typical traumatic injury but instead suspicious for a 2 cm brain mass with mild surrounding vasogenic edema.. CT cervical spine negative. CT abdomen and pelvis showed disruption of the symphysis pubis and left sacroiliac joint with retroperitoneal  hemorrhage in the left side of the pelvis and subcutaneous hemorrhage in the left buttocks as well as fractures left transverse process of L2-S1. Also with incidental findings of spiculated mass in the right middle lobe consistent with carcinoma of the lung with metastatic adenopathy in the subcarinalregion. Several small pulmonary nodules which are too small to characterize. Underwent open reduction internal fixation of pubic symphysis with sacroiliac screw fixation of left SI joint 08/25/2016 per Dr. Marcelino Scot. Nonweightbearing left lower extremity. Weightbearing as tolerated right lower extremity for transfers only. Bed to chair only for 8 weeks. Hospital course pain management. Lung mass to be evaluated as an outpatient. Acute blood loss anemia 6.9 transfused late hemoglobin 9.2.Foley catheter tube remains in place for scrotal edema. Plan voiding trial. Subcutaneous Lovenox added for DVT prophylaxis 08/29/2016.Physical and occupational therapy evaluations completed with recommendations of physical medicine rehabilitation consult.  Patient was admitted for a comprehensive rehabilitation program on 09/01/16. Cognitive Linguistic Evaluation completed on 09/02/16 revealing moderate cognitive deficits in the areas of sustained attention, basic problem solving, money management, medication management, intellectual awareness and decreased recall of new information. Pt requires skilled St to target these areas and increase funcitonal independence and reduce caregiver education prior to discharge. Anticipate that pt will need 24 hour supervision and HHST at discharge. All education complete with wife and daughter.    Skilled Therapeutic Interventions          Skilled treatment session focused on completion of cognitive linguistic evaluation, see above. Pt requires Mod A verbal cues for all tasks which was further complicated by internal distractions of pain and then possible panic attack. All education completed and  questions answered to family satisfaction. Family is planning on providing 24 supervision at time of discharge.    SLP Assessment  Patient will need skilled Speech Lanaguage Pathology Services during CIR admission    Recommendations  Patient destination: Home Follow up Recommendations: Home Health SLP Equipment Recommended: None recommended by SLP    SLP Frequency 3 to 5 out of 7 days   SLP Duration  SLP Intensity  SLP Treatment/Interventions 7 to 9 days  Minumum of 1-2 x/day, 30 to 90 minutes  Cognitive remediation/compensation;Medication managment;Functional tasks;Cueing hierarchy;Patient/family education    Pain    Prior Functioning Cognitive/Linguistic Baseline: Within functional limits Type of Home: House  Lives With: Spouse Available Help at Discharge: Family;Available 24 hours/day Vocation: Retired  Function:    Cognition Comprehension Comprehension assist level: Understands basic 50 - 74% of the time/ requires cueing 25 - 49% of the time  Expression   Expression assist level: Expresses basic 75 - 89% of the time/requires cueing 10 - 24% of the time. Needs helper to occlude trach/needs to repeat words.  Social Interaction Social Interaction assist level: Interacts appropriately 75 - 89% of the time - Needs redirection for appropriate language or to initiate interaction.  Problem Solving Problem solving assist level: Solves basic 25 - 49% of the time - needs direction more than half the time to initiate, plan or complete simple activities  Memory Memory assist level: Recognizes or recalls 25 - 49% of the time/requires cueing 50 - 75% of the time   Short Term Goals: Week 1: SLP Short Term Goal 1 (Week 1): Pt will utilize external memory aids for recall of new infomraiton with Min A verbal cues.  SLP Short Term Goal 2 (Week 1): Pt will demonstrate intellectual awareness by stating 2 physical and 2 cognitive deficits related to acute hospital wiht Min A verbal cues.   SLP Short Term Goal 3 (Week 1): Pt will recall safety/hip precuations related to surgery with Min A verbal cues.  SLP Short Term Goal 4 (Week 1): Pt will solve basic familiar problems  such as money and medication management with Min A verbal cues.  SLP Short Term Goal 5 (Week 1): Pt will sustain attention to basic familiar tasks for ~15 minutes with Min A verbal cues.   Refer to Care Plan for Long Term Goals  Recommendations for other services: None   Discharge Criteria: Patient will be discharged from SLP if patient refuses treatment 3 consecutive times without medical reason, if treatment goals not met, if there is a change in medical status, if patient makes no progress towards goals or if patient is discharged from hospital.  The above assessment, treatment plan, treatment alternatives and goals were discussed and  mutually agreed upon: by patient and by family  Guss Farruggia B. Rutherford Nail, M.S., CCC-SLP Speech-Language Pathologist   [name] [name]    09/02/2016, 11:23 AM

## 2016-09-02 NOTE — Progress Notes (Signed)
ANTICOAGULATION CONSULT NOTE - Initial Consult  Pharmacy Consult for Warfarin/Lovenox Indication: Acute bilateral DVT  Allergies  Allergen Reactions  . Imitrex [Sumatriptan] Nausea And Vomiting and Other (See Comments)    "made me like I was having a stoke"  . Lipitor [Atorvastatin] Nausea And Vomiting and Other (See Comments)    INTOLERANCE > MYALGIAS "couldn't get out of the bed ( pt had to crawl out of bed ) I hurt so bad"   Patient Measurements: Height: '5\' 6"'$  (167.6 cm) Weight: 185 lb 6.4 oz (84.1 kg) IBW/kg (Calculated) : 63.8  Vital Signs: Temp: 97.8 F (36.6 C) (03/16 0500) Temp Source: Oral (03/16 0500) BP: 155/65 (03/16 0500) Pulse Rate: 74 (03/16 0500)  Labs:  Recent Labs  08/31/16 0714 09/01/16 0427 09/02/16 0608  HGB 9.4* 9.2* 9.6*  HCT 27.1* 26.6* 28.5*  PLT 156 185 250  CREATININE  --  0.77  --     Estimated Creatinine Clearance: 77.4 mL/min (by C-G formula based on SCr of 0.77 mg/dL).   Medical History: Past Medical History:  Diagnosis Date  . Atrial fibrillation ([place])    no hx of reported at preop visit of 04/21/11   . CAD (coronary artery disease)   . Carotid artery occlusion    left carotid endarterectomy  . Chronic kidney disease    AAA repair with reimplant of renals   . CHRONIC OBSTRUCTIVE PULMONARY DISEASE    pt denied at visit of 04/21/11   . CORONARY ARTERY DISEASE   . Disorder of left sacroiliac joint 08/24/2016  . DIVERTICULOSIS OF COLON   . GERD   . H/O hiatal hernia   . Headache(784.0)    Hx: of years of years  . HYPERTENSION   . HYPOTHYROIDISM   . JOINT EFFUSION, KNEE   . KNEE, ARTHRITIS, DEGEN./OSTEO    right knee   . Myocardial infarction    1994   . NEPHROLITHIASIS, HX OF   . OSTEOPOROSIS   . Other dysphagia   . PERIPHERAL VASCULAR DISEASE    AAA - 1994 with reimplant of renals   . Peripheral vascular disease ([place])    subclavian stenosis PTA - 3/08   . Pure hypercholesterolemia   . Renal artery stenosis [facility])     Assessment: 76 y/o M already on full dose Lovenox for acute bilateral DVTs. Now starting warfarin today. This will be day #1/5 therapy. No major drug interactions with warfarin are identified. INR last week was 1.36.   Goal of Therapy:  INR 2-3 Monitor platelets by anticoagulation protocol: Yes   Plan:  -Lovenox 85 mg subcutaneous q12h -Warfarin 5 mg PO x 1 at 1800 -Daily PT/INR -Monitor for bleeding -Day #1/5 therapy  Narda Bonds 09/02/2016,7:05 AM

## 2016-09-02 NOTE — Discharge Instructions (Addendum)
Inpatient Rehab Discharge Instructions  [name] [name] Discharge date and time: No discharge date for patient encounter.   Activities/Precautions/ Functional Status: Activity: Nonweightbearing left lower extremity Diet: regular diet Wound Care: keep wound clean and dry Functional status:  ___ No restrictions     ___ Walk up steps independently ___ 24/7 supervision/assistance   ___ Walk up steps with assistance ___ Intermittent supervision/assistance  ___ Bathe/dress independently ___ Walk with walker     _x__ Bathe/dress with assistance ___ Walk Independently    ___ Shower independently ___ Walk with assistance    ___ Shower with assistance ___ No alcohol     ___ Return to work/school ________  Special Instructions: Home health nurse to check INR on 09/21/2016 results to [facility] [phone] fax number [phone]   COMMUNITY REFERRALS UPON DISCHARGE:    Home Health:   PT, OT, RN  [facility]   Date of last service:09/16/2016  Medical Equipment/Items [place]   [phone]     My questions have been answered and I understand these instructions. I will adhere to these goals and the provided educational materials after my discharge from the hospital.  Patient/Caregiver Signature _______________________________ Date __________  Clinician Signature _______________________________________ Date __________  Please bring this form and your medication list with you to all your follow-up doctor's appointments. Information on my medicine - Coumadin   (Warfarin)  This medication education was reviewed with me or my healthcare representative as part of my discharge preparation.    Why was Coumadin prescribed for you? Coumadin was prescribed for you because you have a blood clot or a medical condition that can cause an increased risk of forming blood clots. Blood clots can cause serious health  problems by blocking the flow of blood to the heart, lung, or brain. Coumadin can prevent harmful blood clots from forming. As a reminder your indication for Coumadin is:   Deep Vein Thrombosis Treatment  What test will check on my response to Coumadin? While on Coumadin (warfarin) you will need to have an INR test regularly to ensure that your dose is keeping you in the desired range. The INR (international normalized ratio) number is calculated from the result of the laboratory test called prothrombin time (PT).  If an INR APPOINTMENT HAS NOT ALREADY BEEN MADE FOR YOU please schedule an appointment to have this lab work done by your health care provider within 7 days. Your INR goal is usually a number between:  2 to 3.  Ask your health care provider during an office visit what your goal INR is.  What  do you need to  know  About  COUMADIN? Take Coumadin (warfarin) exactly as prescribed by your healthcare provider about the same time each day.  DO NOT stop taking without talking to the doctor who prescribed the medication.  Stopping without other blood clot prevention medication to take the place of Coumadin may increase your risk of developing a new clot or stroke.  Get refills before you run out.  What do you do if you miss a dose? If you miss a dose, take it as soon as you remember on the same day then continue your regularly scheduled regimen the next day.  Do not take two doses of Coumadin at the same time.  Important Safety Information A possible side effect of Coumadin (Warfarin) is an increased risk of bleeding. You should call your healthcare provider right away if you experience any of  the following: ? Bleeding from an injury or your nose that does not stop. ? Unusual colored urine (red or dark brown) or unusual colored stools (red or black). ? Unusual bruising for unknown reasons. ? A serious fall or if you hit your head (even if there is no bleeding).  Some foods or medicines  interact with Coumadin (warfarin) and might alter your response to warfarin. To help avoid this: ? Eat a balanced diet, maintaining a consistent amount of Vitamin K. ? Notify your provider about major diet changes you plan to make. ? Avoid alcohol or limit your intake to 1 drink for women and 2 drinks for men per day. (1 drink is 5 oz. wine, 12 oz. beer, or 1.5 oz. liquor.)  Make sure that ANY health care provider who prescribes medication for you knows that you are taking Coumadin (warfarin).  Also make sure the healthcare provider who is monitoring your Coumadin knows when you have started a new medication including herbals and non-prescription products.  Coumadin (Warfarin)  Major Drug Interactions  Increased Warfarin Effect Decreased Warfarin Effect  Alcohol (large quantities) Antibiotics (esp. Septra/Bactrim, Flagyl, Cipro) Amiodarone (Cordarone) Aspirin (ASA) Cimetidine (Tagamet) Megestrol (Megace) NSAIDs (ibuprofen, naproxen, etc.) Piroxicam (Feldene) Propafenone (Rythmol SR) Propranolol (Inderal) Isoniazid (INH) Posaconazole (Noxafil) Barbiturates (Phenobarbital) Carbamazepine (Tegretol) Chlordiazepoxide (Librium) Cholestyramine (Questran) Griseofulvin Oral Contraceptives Rifampin Sucralfate (Carafate) Vitamin K   Coumadin (Warfarin) Major Herbal Interactions  Increased Warfarin Effect Decreased Warfarin Effect  Garlic Ginseng Ginkgo biloba Coenzyme Q10 Green tea St. Johns wort    Coumadin (Warfarin) FOOD Interactions  Eat a consistent number of servings per week of foods HIGH in Vitamin K (1 serving =  cup)  Collards (cooked, or boiled & drained) Kale (cooked, or boiled & drained) Mustard greens (cooked, or boiled & drained) Parsley *serving size only =  cup Spinach (cooked, or boiled & drained) Swiss chard (cooked, or boiled & drained) Turnip greens (cooked, or boiled & drained)  Eat a consistent number of servings per week of foods MEDIUM-HIGH in  Vitamin K (1 serving = 1 cup)  Asparagus (cooked, or boiled & drained) Broccoli (cooked, boiled & drained, or raw & chopped) Brussel sprouts (cooked, or boiled & drained) *serving size only =  cup Lettuce, raw (green leaf, endive, romaine) Spinach, raw Turnip greens, raw & chopped   These websites have more information on Coumadin (warfarin):  FailFactory.se; VeganReport.com.au;

## 2016-09-02 NOTE — Progress Notes (Signed)
Occupational Therapy Session Note  Patient Details  Name: [name] [name] MRN: [id number] Date of Birth: [birth year]  Today's Date: 09/02/2016 OT Individual Time: 0901-1000 OT Individual Time Calculation (min): 59 min    Short Term Goals: Week 1:  OT Short Term Goal 1 (Week 1): STGs = LTGs  Skilled Therapeutic Interventions/Progress Updates:    OT session focused on transfer training, modified bathing/dressing, and improved sit<>stand. Pt required max instructional cues to transfer from sup<>sit w/ Mod A and HOB elevated. Sit<>stand from bed with max multimodal cues to motor plan sit<>stand while maintaining NWB LLE. Pt unable to safely stand from bed height. Lateral scooting transfer to wc on R with min A. Bathing/dressing at the sink with verbal cues to sequence bathing tasks. Sit<>stand with Mod A at the sink with Min A to maintain standing balance while OT washed buttocks and donned new brief. Pt had to wear hospital gown but wife to go get new clothing today. Educated pt's spouse on elevation for scrotal edema. Pt left seated in wc with spouse present and BLE's on elevating leg rests.   Therapy Documentation Precautions:  Precautions Precautions: Fall Restrictions Weight Bearing Restrictions: Yes RLE Weight Bearing: Weight bearing as tolerated LLE Weight Bearing: Non weight bearing Other Position/Activity Restrictions: bed to chair only for 8 weeks; WBAT RLE for transfers only   See Function Navigator for Current Functional Status.   Therapy/Group: Individual Therapy  [name] 09/02/2016, 4:46 PM

## 2016-09-02 NOTE — Progress Notes (Signed)
Pt. T 101.Pt. Went for MRI, Linna Hoff [facility] : [facility] aware of the temperature.

## 2016-09-02 NOTE — Progress Notes (Signed)
Called by Rn for a "second set of eyes".  As per Rn patient has been confused, while working with therapy he became more agitated, confused and SOB with sats dropping into the 80's.  Patient was placed on 1 lPM nasal cannula and sats came up to mid 90's.  Patient also noted to be hypertensive 157/106.  AS per RN appears to be similar to a panic attack.  Linna Hoff, PA at bedside and orders received prior to my arrival.  RN has given xanax, patient appears more calmer than earlier.  PCXR being obtained.  NO RRT interventions, RN to call if assistance needed

## 2016-09-02 NOTE — Progress Notes (Signed)
Physical Therapy Note  Patient Details  Name: [name] [name] MRN: [id number] Date of Birth: 15-Aug-1940 Today's Date: 09/02/2016   Attempted to see patient at 54, but pt resting and unable to maintain arousal enough to functionally participate. Daughter at bedside and reports he finally has calmed down after possible panic attack earlier this morning. No orders to hold therapy at this time and RN made aware. Plan to check back later this PM if patient more alert.   Pt seen again at 1410, but continues to be sleeping. Daughter still at bedside and reports patient did not awaken during IV change.   Pt missed 75 min of skilled PT due to fatigue. Continue PT POC.     [name], PT, DPT  09/02/2016, 1:12 PM

## 2016-09-02 NOTE — Progress Notes (Signed)
Brief Note (please see previous note):  Pt noted to be hypertensive, tachycardic with drop in sats. CXR showing PNA, Abx initiated.

## 2016-09-02 NOTE — Progress Notes (Signed)
Social Work Patient ID: [name] [name], male   DOB: February 02, 1941, 76 y.o.   MRN: [id number]  Saw pt, daughter, wife and friend who were here earlier and report he is much better, calmer and resting. Discussed with all the importance of wife taking care of herself and she had planned to go home, but unsure with this happening if she will go and leave daughter here. Discussed briefly her need to eat And the guest tray issue. She is planning to leave and son and daughter to be here with Dad. Will continue to provide support and assist with needs.

## 2016-09-02 NOTE — Progress Notes (Signed)
Pharmacy Antibiotic Note  [name] [name] is a 76 y.o. male admitted on 08/31/2016 with pneumonia.  Pharmacy has been consulted for ceftriaxone dosing.  Plan: - ceftriaxone 1g iv q24h  Height: '5\' 6"'$  (167.6 cm) Weight: 185 lb 6.4 oz (84.1 kg) IBW/kg (Calculated) : 63.8  Temp (24hrs), Avg:98 F (36.7 C), Min:97.8 F (36.6 C), Max:98.1 F (36.7 C)   Recent Labs Lab 08/27/16 0402  08/28/16 1135 08/30/16 0724 08/31/16 0714 09/01/16 0427 09/02/16 0608  WBC 7.8  < > 8.0 8.2 7.2 6.9 10.6*  CREATININE 0.64  --  0.64  --   --  0.77  --   < > = values in this interval not displayed.  Estimated Creatinine Clearance: 77.4 mL/min (by C-G formula based on SCr of 0.77 mg/dL).    Allergies  Allergen Reactions  . Imitrex [Sumatriptan] Nausea And Vomiting and Other (See Comments)    "made me like I was having a stoke"  . Lipitor [Atorvastatin] Nausea And Vomiting and Other (See Comments)    INTOLERANCE > MYALGIAS "couldn't get out of the bed ( pt had to crawl out of bed ) I hurt Dhani Imel bad"    Thank you for allowing pharmacy to be a part of this patient's care.  Arael Piccione, Tsz-Yin 09/02/2016 12:46 PM

## 2016-09-02 NOTE — Progress Notes (Signed)
Occupational Therapy Note  Patient Details  Name: [name] [name] MRN: [id number] Date of Birth: 08/22/1940  Today's Date: 09/02/2016  Pt missed 30 mins of OT secondary to fatigue. Pt left sleeping in bed at end of session with daughter.  Nursing made aware.   Benedetto Ryder OTR/L 09/02/2016, 3:22 PM

## 2016-09-02 NOTE — Telephone Encounter (Addendum)
Spoke with Lauraine Rinne PA, to inform him that we have ordered a special treatment planning MRI to be done while [name] [name] is an IP. The hope is that this scan is done over the weekend so we can move forward with getting him set up for radiation therapy. I will be sure to be in communication with [name] [name] and the rehab team to ensure the best arrangements for both radiation and physical therapy are in place.   Mont Dutton R.T.(R)(T) Special Procedures  Navigator [phone]   Lauraine Rinne : [phone]  --------------------------------- 09/07/16 Spoke with [name] [name] again in regards to the recent brain MRI on [name] [name]. There is a question if this area on his brain scan is actually a hemorrhage or an infarct rather than a brain met. The plan is to re-scan the brain in 6-8 weeks to re-evaluate. I will schedule this using the 3T scanner and the SRS protocol for potential treatment planning.   [place], Dr. Johny Shears PA, will come by to see [name] [name] and share this plan with him.   The pulmonary team is however moving forward with getting a biopsy of the lung while he is still in the hospital.   Mont Dutton  -----------------------------------------

## 2016-09-02 NOTE — Discharge Summary (Signed)
[name] Discharge Summary   Patient ID: [name] [name] MRN: [id number] DOB/AGE: May 20, 1941 76 y.o.  Admit date: 08/22/2016 Discharge date: 09/02/2016  Discharge Diagnosis Fall from ladder Open book pelvic fracture with left SI disruption Acute blood loss anemia Transverse process fractures L2-S1 Thrombocytopenia Lung mass  Peripheral vascular disease Urinary retention  Consultants Orthopedic surgery - Dr. Erlinda Hong, Dr. Carter Kitten physical medicine and rehabilitation - Dr. Posey Pronto Cardiology - Dr. Johnsie Cancel Oncology - Dr. Earlie Server  Imaging: 08/22/16 DG CHEST - No acute abnormalities.  CABG. Aortic atherosclerosis.  08/22/16 DG PELVIS - Mild diastasis and offset at the pubic symphysis suspicious for symphyseal disruption. Questionable vertical lucencies in the superolateral sacral ala bilaterally, cannot exclude nondisplaced sacral fractures. No sacroiliac joint diastasis.  08/22/16 CT HEAD/C-SPINE W/O - Abnormal anterior left frontal lobe, but the appearance does not resemble typical traumatic injury to the brain and is instead suspicious in this clinical setting for a 2 cm brain mass with mild surrounding vasogenic edema. Brain MRI without and with contrast when possible would characterize further. No other acute intracranial abnormality is evident. Mild chronic small vessel disease in the right basal ganglia. No acute fracture or listhesis identified in the cervical spine.Heterogeneous bone mineralization in the cervical spine could be degenerative in nature but early osseous metastatic disease is difficult to exclude.  08/22/16 CT CHEST/ABD/PELVIS W/ - Disruption of the symphysis pubis and left sacroiliac joint with retroperitoneal hemorrhage in the left side of the pelvis and subcutaneous hemorrhage in the left buttock. I suspect there is an injury to the left external iliac vein. Spiculated mass in the right middle lobe consistent with carcinoma the lung with metastatic adenopathy  in the subcarinal region. Several small pulmonary nodules which are too small to characterize but could represent metastases. Fractures of the left transverse processes of L2 through S1.  08/25/16 DG PELVIS - Interval ORIF of the pubic symphysis and left SI joint.  08/25/16 DG PELVIS - Status post surgical internal fixation of pubic symphysis and left sacroiliac joint.  Procedures Dr. Altamese Tippecanoe (08/25/16) -  1. Open reduction and internal fixation of pubic symphysis. 2. Sacroiliac screw fixation, left SI joint.  Hospital Course:  76 year old white male with a medical history significant for hypertension, hyperlipidemia, A. 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 Upon arrival to the emergency department patient was hemodynamically stable and GCS 15. FAST was negative. In the emergency department he was intermittently hypotensive but responded appropriately to 2 units of packed red blood cells and 2 units of FFP. 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 Orthopedic surgery was consulted and the patient was admitted for further treatment, observation, and pain control. Plavix was held.   Orthopedic surgery recommended surgical fixation of the patient's unstable pelvis and on 08/25/16 the patient underwent the procedure above without complication. Orthopedic surgery recommended that the patient remain weightbearing as tolerated on his left lower extremity for transfers only and was otherwise nonweightbearing. Postoperatively the patient had expected acute blood loss anemia and thrombocytopenia requiring 1 unit of packed red blood cells. Thrombocytopenia did resolve and the patient was restarted on DVT prophylaxis. He remained  hemodynamically stable and was later restarted on his home dose of Plavix. Postoperatively the patients Foley catheter remained  in place due to significant scrotal and penile edema. On postop day #6 the patient's vital signs were stable, he was mobilizing with physical therapy, tolerating oral intake, Foley remained in place, and patient was accepted to [facility] inpatient rehabilitation for further physical therapy. He will require outpatient follow-up with orthopedic surgery and oncology.   Allergies as of 08/31/2016      Reactions   Imitrex [sumatriptan] Nausea And Vomiting, Other (See Comments)   "made me like I was having a stoke"   Lipitor [atorvastatin] Nausea And Vomiting, Other (See Comments)   INTOLERANCE > MYALGIAS "couldn't get out of the bed ( pt had to crawl out of bed ) I hurt so bad"      Medication List    ASK your doctor about these medications   acetaminophen 325 MG tablet Commonly known as:  TYLENOL Take 325-650 mg by mouth every 6 (six) hours as needed (for pain).   aspirin EC 81 MG tablet Take 1 tablet (81 mg total) by mouth daily.   clopidogrel 75 MG tablet Commonly known as:  PLAVIX TAKE ONE (1) TABLET EACH DAY   CO Q-10 PO Take 1 capsule by mouth daily.   lansoprazole 15 MG capsule Commonly known as:  PREVACID Take 1 capsule (15 mg total) by mouth daily.   levothyroxine 112 MCG tablet Commonly known as:  SYNTHROID, LEVOTHROID Take 1 tablet (112 mcg total) by mouth daily.   metoprolol 50 MG tablet Commonly known as:  LOPRESSOR Take 1 tablet (50 mg total) by mouth 2 (two) times daily.   niacin 500 MG tablet Commonly known as:  SLO-NIACIN Take 1,500 mg by mouth at bedtime.   nitroGLYCERIN 0.4 MG SL tablet Commonly known as:  NITROSTAT Place 1 tablet (0.4 mg total) under the tongue every 5 (five) minutes as needed for chest pain.   rosuvastatin 20 MG tablet Commonly known as:  CRESTOR Take 1 tablet (20 mg total) by mouth daily.   tamsulosin 0.4 MG  Caps capsule Commonly known as:  FLOMAX Take 1 capsule (0.4 mg total) by mouth daily.   triamterene-hydrochlorothiazide 37.5-25 MG tablet Commonly known as:  MAXZIDE-25 Take 1 tablet by mouth every morning.         Signed: Obie Dredge, [facility] 09/02/2016, 2:34 PM Pager: [phone] Consults: [phone] Mon-Fri 7:00 am-4:30 pm Sat-Sun 7:00 am-11:30 am

## 2016-09-02 NOTE — Consult Note (Signed)
Radiation Oncology         [phone] ________________________________  Initial inpatient Consultation  Name: [name] [name] MRN: [id number]  Date: 08/31/2016  DOB: 1941/05/18  LO:VFIEP Wolfgang Phoenix, MD  No ref. provider found   REFERRING PHYSICIAN: No ref. provider found Dr. Earlie Server  DIAGNOSIS: The encounter diagnosis was SOB (shortness of breath). Putative stage IV ([id number]) lung cancer with metastasis to the brain.    ICD-9-CM ICD-10-CM   1. SOB (shortness of breath) 786.05 R06.02 DG CHEST PORT 1 VIEW     DG CHEST PORT 1 VIEW    HISTORY OF PRESENT ILLNESS: [name] [name] is a 76 y.o. male seen at the request of Dr. Earlie Server for newly diagnosed putative stage IV lung cancer with metastasis to the brain. He was admitted to the hospital on 08/22/2016 after sustaining a 12 foot fall from a ladder while attempting to clean out his stove pipe on his roof at home. The patient sustained an open book pelvic fracture as well as fractures of the left transverse processes of L2-S1 as a result of his fall. CT Head obtained on admission failed to demonstrate any acute trauma but did reveal a 2 cm mass in the anterior left frontal lobe with mild surrounding vasogenic edema. No other acute intracranial abnormalities were noted. The patient also had a CT Chest with contrast which demonstrated a spiculated 2.0 x 2.8 cm mass in the right middle lobe consistent with a primary carcinoma of the lung with metastatic adenopathy in the subcarinal region (subcarinal lymph node measures 3.7 x 2.3). There were several other small, bilateral pulmonary nodules which were too small to characterize.   The patient underwent open reduction and internal fixation of pubic symphysis fracture with sacroiliac screw fixation of the left SI joint on 08/25/2016. He has recently transferred to the inpatient rehabilitation center and remains nonweightbearing on the left lower extremity with weight-bearing as tolerated on the right  lower extremity for transfer only from bed to chair currently. During the course of his hospitalization, the patient also developed bilateral lower extremity DVTs which are being managed with Lovenox daily and transitioning to coumadin.  The patient is currently without complaints of chest pain, shortness of breath or dyspnea. He denies having any of the symptoms at home prior to his fall.  He also denies headaches, visual changes, and balance or gait disturbance. Prior to this admission, he reports being extremely active, going to the gym 5 days a week and his wife concurs.  PREVIOUS RADIATION THERAPY: No  PAST MEDICAL HISTORY:  Past Medical History:  Diagnosis Date  . Atrial fibrillation ([place])    no hx of reported at preop visit of 04/21/11   . CAD (coronary artery disease)   . Carotid artery occlusion    left carotid endarterectomy  . Chronic kidney disease    AAA repair with reimplant of renals   . CHRONIC OBSTRUCTIVE PULMONARY DISEASE    pt denied at visit of 04/21/11   . CORONARY ARTERY DISEASE   . Disorder of left sacroiliac joint 08/24/2016  . DIVERTICULOSIS OF COLON   . GERD   . H/O hiatal hernia   . Headache(784.0)    Hx: of years of years  . HYPERTENSION   . HYPOTHYROIDISM   . JOINT EFFUSION, KNEE   . KNEE, ARTHRITIS, DEGEN./OSTEO    right knee   . Myocardial infarction    1994   . NEPHROLITHIASIS, HX OF   . OSTEOPOROSIS   . Other  dysphagia   . PERIPHERAL VASCULAR DISEASE    AAA - 1994 with reimplant of renals   . Peripheral vascular disease ([place])    subclavian stenosis PTA - 3/08   . Pure hypercholesterolemia   . Renal artery stenosis ([place])       PAST SURGICAL HISTORY: Past Surgical History:  Procedure Laterality Date  . ABDOMINAL AORTIC ANEURYSM REPAIR     with reimplantation of renals   . CARDIAC CATHETERIZATION     2008  . CAROTID ENDARTERECTOMY Left Jan. 30, 2015   CEA  . CHOLECYSTECTOMY  2000   Gall Bladder  . COLONOSCOPY W/ BIOPSIES AND POLYPECTOMY      Hx: of  . CORONARY ARTERY BYPASS GRAFT     1995  . CORONARY STENT PLACEMENT     Hx: of  . ENDARTERECTOMY Left 07/19/2013   Procedure: ENDARTERECTOMY CAROTID;  Surgeon: Mal Misty, MD;  Location: [place];  Service: Vascular;  Laterality: Left;  . GALLBLADDER SURGERY  2004   . JOINT REPLACEMENT     partial knee replacement on left 2002   . KNEE SURGERY    . ORIF PELVIC FRACTURE Left 08/25/2016   Procedure: OPEN REDUCTION INTERNAL FIXATION (ORIF) PELVIC FRACTURE; plate on front, SI screw on the back;  Surgeon: Altamese Fowlerville, MD;  Location: [place];  Service: Orthopedics;  Laterality: Left;  . OTHER SURGICAL HISTORY     left subclavian stenosis surgery PTA 08/2006   . OTHER SURGICAL HISTORY     carotid surgery on right 2004   . TOTAL KNEE ARTHROPLASTY  04/29/2011   Procedure: TOTAL KNEE ARTHROPLASTY;  Surgeon: Gearlean Alf;  Location: [facility];  Service: Orthopedics;  Laterality: Right;  [name] VASCULAR SURGERY     AAA  . VASECTOMY  1973    FAMILY HISTORY:  Family History  Problem Relation Age of Onset  . Hypertension Mother   . Heart disease Father   . Heart attack Father   . Cancer Brother     Lung  . Diabetes Sister     SOCIAL HISTORY:  Social History   Social History  . Marital status: Married    Spouse name: [name]  . Number of children: [name]  . Years of education: [name]   Occupational History  . Not on file.   Social History Main Topics  . Smoking status: Former Smoker    Types: Cigarettes    Quit date: 06/20/1992  . Smokeless tobacco: Never Used  . Alcohol use No  . Drug use: No  . Sexual activity: Not on file   Other Topics Concern  . Not on file   Social History Narrative  . No narrative on file    ALLERGIES: Imitrex [sumatriptan] and Lipitor [atorvastatin]  MEDICATIONS:  Current Facility-Administered Medications  Medication Dose Route Frequency Provider Last Rate Last Dose  . acetaminophen (TYLENOL) tablet 650 mg  650 mg Oral Q6H PRN Lavon Paganini Angiulli,  PA-C      . ALPRAZolam Duanne Moron) tablet 0.25 mg  0.25 mg Oral TID PRN Tilda Franco, MD   0.25 mg at 09/02/16 1106  . aspirin chewable tablet 81 mg  81 mg Oral Daily Josue Hector, MD   81 mg at 09/02/16 0801  . bisacodyl (DULCOLAX) suppository 10 mg  10 mg Rectal Daily PRN Lavon Paganini Angiulli, PA-C      . cefTRIAXone (ROCEPHIN) injection 250 mg  250 mg Intramuscular Q24H Daniel J Angiulli, PA-C      . docusate  sodium (COLACE) capsule 100 mg  100 mg Oral BID Lavon Paganini Angiulli, PA-C   100 mg at 09/02/16 0800  . enoxaparin (LOVENOX) injection 85 mg  1 mg/kg Subcutaneous Q12H Ankit Lorie Phenix, MD   85 mg at 09/02/16 0430  . feeding supplement (PRO-STAT SUGAR FREE 64) liquid 30 mL  30 mL Oral BID Ankit Lorie Phenix, MD   30 mL at 09/02/16 0801  . furosemide (LASIX) tablet 40 mg  40 mg Oral Daily Daniel J Angiulli, PA-C      . levothyroxine (SYNTHROID, LEVOTHROID) tablet 112 mcg  112 mcg Oral QAC breakfast Lavon Paganini Angiulli, PA-C   112 mcg at 09/02/16 9371  . lidocaine (XYLOCAINE) 2 % jelly 1 application  1 application Urethral QID Lavon Paganini Angiulli, PA-C   1 application at 69/67/89 0802  . metoprolol tartrate (LOPRESSOR) tablet 75 mg  75 mg Oral BID Ankit Lorie Phenix, MD      . nitroGLYCERIN (NITROSTAT) SL tablet 0.4 mg  0.4 mg Sublingual Q5 min PRN Lavon Paganini Angiulli, PA-C      . ondansetron [facility]) tablet 4 mg  4 mg Oral Q6H PRN Lavon Paganini Angiulli, PA-C       Or  . ondansetron (ZOFRAN) injection 4 mg  4 mg Intravenous Q6H PRN Lavon Paganini Angiulli, PA-C      . oxyCODONE (Oxy IR/ROXICODONE) immediate release tablet 5-10 mg  5-10 mg Oral Q6H PRN Lavon Paganini Angiulli, PA-C   10 mg at 09/02/16 1051  . oxyCODONE-acetaminophen (PERCOCET/ROXICET) 5-325 MG per tablet 1-2 tablet  1-2 tablet Oral Q6H PRN Cathlyn Parsons, PA-C   1 tablet at 09/02/16 3810  . pantoprazole (PROTONIX) EC tablet 40 mg  40 mg Oral Daily Lavon Paganini Angiulli, PA-C   40 mg at 09/02/16 0801  . rosuvastatin (CRESTOR) tablet 20 mg  20 mg Oral q1800 Lavon Paganini Angiulli, PA-C   20 mg at 09/01/16 1708  . senna (SENOKOT) tablet 8.6 mg  1 tablet Oral Daily Daniel J Angiulli, PA-C   8.6 mg at 09/02/16 0800  . sorbitol 70 % solution 30 mL  30 mL Oral Daily PRN Lavon Paganini Angiulli, PA-C      . tamsulosin (FLOMAX) capsule 0.4 mg  0.4 mg Oral Daily Lavon Paganini Angiulli, PA-C   0.4 mg at 09/02/16 0800  . traZODone (DESYREL) tablet 25 mg  25 mg Oral QHS PRN Ankit Lorie Phenix, MD   25 mg at 09/01/16 1954  . triamterene-hydrochlorothiazide (MAXZIDE-25) 37.5-25 MG per tablet 1 tablet  1 tablet Oral Daily Lavon Paganini Angiulli, PA-C   1 tablet at 09/01/16 1105  . warfarin (COUMADIN) tablet 5 mg  5 mg Oral ONCE-1800 Erenest Blank, RPH      . Warfarin - Pharmacist Dosing Inpatient   Does not apply q1800 Erenest Blank, RPH        REVIEW OF SYSTEMS:  On review of systems, the patient reports that he is doing well overall and gradually improving with each day of PT. He denies any chest pain, shortness of breath, cough, fevers, chills, night sweats, unintended weight changes. He denies any bowel or bladder disturbances, and denies abdominal pain, nausea or vomiting. He denies any new musculoskeletal or joint aches or pains aside from his recent injuries in the pelvis and low back as indicated in HPI. A complete review of systems is obtained and is otherwise negative.    PHYSICAL EXAM:  Wt Readings from Last 3 Encounters:  08/31/16 185  lb 6.4 oz (84.1 kg)  08/22/16 158 lb (71.7 kg)  08/18/16 158 lb 6 oz (71.8 kg)   Temp Readings from Last 3 Encounters:  09/02/16 97.8 F (36.6 C) (Oral)  08/31/16 98.6 F (37 C) (Oral)  08/17/16 97 F (36.1 C)   BP Readings from Last 3 Encounters:  09/02/16 (!) 157/106  08/31/16 (!) 154/60  08/18/16 132/76   Pulse Readings from Last 3 Encounters:  09/02/16 (!) 111  08/31/16 75  08/17/16 (!) 53   Pain Assessment Pain Score: 0-No pain/10  In general this is a well appearing caucasian male in no acute distress. He is upright on  the bedside commode and appears comfortable. He is alert and oriented x4 and appropriate throughout the examination. HEENT reveals that the patient is normocephalic, atraumatic. EOMs are intact. PERRLA. Skin is intact without any evidence of gross lesions. Cardiovascular exam reveals a regular rate and rhythm, no clicks rubs or murmurs are auscultated. Chest is clear to auscultation bilaterally. Lymphatic assessment is performed and does not reveal any adenopathy in the cervical, supraclavicular, or axillary chains. Abdomen has active bowel sounds in all quadrants and is intact. The abdomen is soft, non tender, non distended. Lower extremities demonstrate 1+ edema bilaterally and tenderness to palpation secondary to known bilateral DVTs, but without cyanosis or clubbing.   KPS = 50 due to current injuries and post-op recovery  100 - Normal; no complaints; no evidence of disease. 90   - Able to carry on normal activity; minor signs or symptoms of disease. 80   - Normal activity with effort; some signs or symptoms of disease. 71   - Cares for self; unable to carry on normal activity or to do active work. 60   - Requires occasional assistance, but is able to care for most of his personal needs. 50   - Requires considerable assistance and frequent medical care. 68   - Disabled; requires special care and assistance. 55   - Severely disabled; hospital admission is indicated although death not imminent. 88   - Very sick; hospital admission necessary; active supportive treatment necessary. 10   - Moribund; fatal processes progressing rapidly. 0     - Dead  Karnofsky DA, Abelmann [place], Craver LS and Burchenal [facility] [phone]) The use of the nitrogen mustards in the palliative treatment of carcinoma: with particular reference to bronchogenic carcinoma Cancer 1 634-56  LABORATORY DATA:  Lab Results  Component Value Date   WBC 10.6 (H) 09/02/2016   HGB 9.6 (L) 09/02/2016   HCT 28.5 (L) 09/02/2016   MCV 87.7  09/02/2016   PLT 250 09/02/2016   Lab Results  Component Value Date   NA 133 (L) 09/01/2016   K 3.4 (L) 09/01/2016   CL 98 (L) 09/01/2016   CO2 27 09/01/2016   Lab Results  Component Value Date   ALT 20 09/01/2016   AST 33 09/01/2016   ALKPHOS 78 09/01/2016   BILITOT 1.7 (H) 09/01/2016     RADIOGRAPHY: Ct Head Wo Contrast  Result Date: 08/22/2016 CLINICAL DATA:  76 year old male status post fall from ladder. Extensive pelvic fractures. Abnormal chest CT today revealing a spiculated right middle lobe lung mass. Initial encounter. EXAM: CT HEAD WITHOUT CONTRAST CT CERVICAL SPINE WITHOUT CONTRAST TECHNIQUE: Multidetector CT imaging of the head and cervical spine was performed following the standard protocol without intravenous contrast. Multiplanar CT image reconstructions of the cervical spine were also generated. COMPARISON:  CT chest abdomen and pelvis from today reported  separately. [facility] Brain MRI 07/13/2004. FINDINGS: CT HEAD FINDINGS Brain: Rounded mixed hyper and hypodense somewhat masslike area in the anterior left frontal lobe estimated at 2 cm. Adjacent increased white matter hypodensity in a configuration suggestive of mild vasogenic edema. See series 8, image 43. No significant regional mass effect. No second similar lesion identified elsewhere although there is a punctate perhaps dystrophic calcification in the right frontal lobe at the gray-white matter junction (series 4, image 26). No superimposed intracranial hemorrhage or mass effect. Basilar cisterns are patent. Cavum septum pellucidum variant. No ventriculomegaly. Hypodensity in the right caudate nucleus compatible with chronic small vessel disease. No acute cortically based infarct identified. Vascular: Calcified atherosclerosis at the skull base. No suspicious intracranial vascular hyperdensity. Skull: No skull fracture identified. Bone mineralization appears within normal limits for age. Sinuses/Orbits: Mild to  moderate anterior ethmoid and frontoethmoidal recess mucosal thickening. Other paranasal sinuses are clear. The left tympanic cavity and mastoids are clear. There is sclerosis of the right mastoids. There is asymmetric thickening of the right tympanic membrane. Other: Visualized orbit soft tissues are within normal limits. Mild left posterosuperior convexity scalp contusion or hematoma (series 6, image 76) with intact underlying calvarium. No other scout soft tissue injury identified. CT CERVICAL SPINE FINDINGS Alignment: Preserved cervical lordosis. Cervicothoracic junction alignment is within normal limits. There is trace anterolisthesis of C3 on C4 and C4 on C5 with associated chronic facet arthropathy. Bilateral posterior element alignment is within normal limits. Skull base and vertebrae: Visualized skull base is intact. No atlanto-occipital dissociation. No cervical spine fracture identified. There is heterogeneous bone mineralization in the cervical spine, including small lucent lesions in the right C4 (coronal image 31) and left C6 (coronal image 36) levels. Soft tissues and spinal canal: No prevertebral fluid or swelling. No visible canal hematoma. Ligamentous hypertrophy about the odontoid. Calcified carotid and vertebral artery atherosclerosis. Evidence of previous bilateral carotid endarterectomy. Extensive calcified proximal great vessel atherosclerosis at the visible thoracic inlet. Disc levels: Moderate to severe chronic facet degeneration greater on the right from the C3 to the C6 level. Disc bulging. Multilevel ligament flavum hypertrophy suspected. Up to mild degenerative cervical spinal stenosis at C3-C4. Upper chest: Visible upper thoracic levels appear intact. Chest findings today are reported separately. IMPRESSION: 1. Abnormal anterior left frontal lobe, but the appearance does not resemble typical traumatic injury to the brain and is instead suspicious in this clinical setting for a 2 cm  brain mass with mild surrounding vasogenic edema. Brain MRI without and with contrast when possible would characterize further. 2. No other acute intracranial abnormality is evident. Mild chronic small vessel disease in the right basal ganglia. 3.  No acute fracture or listhesis identified in the cervical spine. 4. Heterogeneous bone mineralization in the cervical spine could be degenerative in nature but early osseous metastatic disease is difficult to exclude. The head CT findings were reviewed in person with Dr. Doreen Salvage on 08/22/2016 at 1557 hours. Electronically Signed   By: Genevie Ann M.D.   On: 08/22/2016 16:23   Ct Chest W Contrast  Result Date: 08/22/2016 CLINICAL DATA:  Multiple trauma. The patient fell 12 feet from a ladder. EXAM: CT CHEST, ABDOMEN, AND PELVIS WITH CONTRAST TECHNIQUE: Multidetector CT imaging of the chest, abdomen and pelvis was performed following the standard protocol during bolus administration of intravenous contrast. CONTRAST:  100 cc Omnipaque 300 COMPARISON:  Pelvic radiograph dated 08/22/2016 and chest x-ray dated 08/22/2016 FINDINGS: CT CHEST FINDINGS Cardiovascular: Extensive aortic atherosclerosis. Extensive  coronary artery calcifications. Prior CABG. Heart size is normal. Mediastinum/Nodes: 3.7 x 2.3 cm enlarged subcarinal lymph node. Thyroid gland, trachea, and esophagus demonstrate no significant findings. Lungs/Pleura: There is a spiculated 2.0 x 2.8 cm mass in the right middle lobe with soft tissue stranding to the periphery, consistent with a primary carcinoma the lung. There is a vague 7 mm area of abnormal density in the posterior aspect of the left upper lobe on image 47 of series 3. There are 2 small nodular densities in the right upper lobe on image number 31 of series 3, 1 is 4 mm and the other is 2 mm. Musculoskeletal: No fractures or other acute abnormalities. CT ABDOMEN PELVIS FINDINGS Hepatobiliary: No focal liver abnormality is seen. Status post cholecystectomy.  No biliary dilatation. Pancreas: Unremarkable. No pancreatic ductal dilatation or surrounding inflammatory changes. Spleen: No splenic injury or perisplenic hematoma. Adrenals/Urinary Tract: Adrenal glands are unremarkable. Kidneys are normal, without renal calculi, focal lesion, or hydronephrosis. There is retroperitoneal hemorrhage around the left side of the bladder. Tear is factor Maxwell findings no hemorrhage contrecoup never mind anterior to a diameter question MR plaques are Stomach/Bowel: Stomach is within normal limits. Appendix appears normal. No evidence of bowel wall thickening, distention, or inflammatory changes. Vascular/Lymphatic: There is retroperitoneal hemorrhage in the pelvis particularly adjacent to the left common iliac artery. I suspect the bleeding is from the left iliac vein. There is blood in the retroperitoneum anterior to the prostate gland and bladder. No appreciable free intraperitoneal hemorrhage. Hemorrhagic contusion in the left buttock. Reproductive: Slight enlargement of the prostate gland. Other: There is hemorrhage along the left iliopsoas muscle. Musculoskeletal: There is diastases of the symphysis pubis and of the left sacroiliac joint. There are fractures of the left transverse processes of L2 through S1. IMPRESSION: 1. Disruption of the symphysis pubis and left sacroiliac joint with retroperitoneal hemorrhage in the left side of the pelvis and subcutaneous hemorrhage in the left buttock. I suspect there is an injury to the left external iliac vein. 2. Spiculated mass in the right middle lobe consistent with carcinoma the lung with metastatic adenopathy in the subcarinal region. 3. Several small pulmonary nodules which are too small to characterize but could represent metastases. 4. Fractures of the left transverse processes of L2 through S1. Electronically Signed   By: Lorriane Shire M.D.   On: 08/22/2016 16:19   Ct Cervical Spine Wo Contrast  Result Date:  08/22/2016 CLINICAL DATA:  76 year old male status post fall from ladder. Extensive pelvic fractures. Abnormal chest CT today revealing a spiculated right middle lobe lung mass. Initial encounter. EXAM: CT HEAD WITHOUT CONTRAST CT CERVICAL SPINE WITHOUT CONTRAST TECHNIQUE: Multidetector CT imaging of the head and cervical spine was performed following the standard protocol without intravenous contrast. Multiplanar CT image reconstructions of the cervical spine were also generated. COMPARISON:  CT chest abdomen and pelvis from today reported separately. [facility] Brain MRI 07/13/2004. FINDINGS: CT HEAD FINDINGS Brain: Rounded mixed hyper and hypodense somewhat masslike area in the anterior left frontal lobe estimated at 2 cm. Adjacent increased white matter hypodensity in a configuration suggestive of mild vasogenic edema. See series 8, image 43. No significant regional mass effect. No second similar lesion identified elsewhere although there is a punctate perhaps dystrophic calcification in the right frontal lobe at the gray-white matter junction (series 4, image 26). No superimposed intracranial hemorrhage or mass effect. Basilar cisterns are patent. Cavum septum pellucidum variant. No ventriculomegaly. Hypodensity in the right caudate nucleus compatible  with chronic small vessel disease. No acute cortically based infarct identified. Vascular: Calcified atherosclerosis at the skull base. No suspicious intracranial vascular hyperdensity. Skull: No skull fracture identified. Bone mineralization appears within normal limits for age. Sinuses/Orbits: Mild to moderate anterior ethmoid and frontoethmoidal recess mucosal thickening. Other paranasal sinuses are clear. The left tympanic cavity and mastoids are clear. There is sclerosis of the right mastoids. There is asymmetric thickening of the right tympanic membrane. Other: Visualized orbit soft tissues are within normal limits. Mild left posterosuperior convexity  scalp contusion or hematoma (series 6, image 76) with intact underlying calvarium. No other scout soft tissue injury identified. CT CERVICAL SPINE FINDINGS Alignment: Preserved cervical lordosis. Cervicothoracic junction alignment is within normal limits. There is trace anterolisthesis of C3 on C4 and C4 on C5 with associated chronic facet arthropathy. Bilateral posterior element alignment is within normal limits. Skull base and vertebrae: Visualized skull base is intact. No atlanto-occipital dissociation. No cervical spine fracture identified. There is heterogeneous bone mineralization in the cervical spine, including small lucent lesions in the right C4 (coronal image 31) and left C6 (coronal image 36) levels. Soft tissues and spinal canal: No prevertebral fluid or swelling. No visible canal hematoma. Ligamentous hypertrophy about the odontoid. Calcified carotid and vertebral artery atherosclerosis. Evidence of previous bilateral carotid endarterectomy. Extensive calcified proximal great vessel atherosclerosis at the visible thoracic inlet. Disc levels: Moderate to severe chronic facet degeneration greater on the right from the C3 to the C6 level. Disc bulging. Multilevel ligament flavum hypertrophy suspected. Up to mild degenerative cervical spinal stenosis at C3-C4. Upper chest: Visible upper thoracic levels appear intact. Chest findings today are reported separately. IMPRESSION: 1. Abnormal anterior left frontal lobe, but the appearance does not resemble typical traumatic injury to the brain and is instead suspicious in this clinical setting for a 2 cm brain mass with mild surrounding vasogenic edema. Brain MRI without and with contrast when possible would characterize further. 2. No other acute intracranial abnormality is evident. Mild chronic small vessel disease in the right basal ganglia. 3.  No acute fracture or listhesis identified in the cervical spine. 4. Heterogeneous bone mineralization in the  cervical spine could be degenerative in nature but early osseous metastatic disease is difficult to exclude. The head CT findings were reviewed in person with Dr. Doreen Salvage on 08/22/2016 at 1557 hours. Electronically Signed   By: Genevie Ann M.D.   On: 08/22/2016 16:23   Ct Abdomen Pelvis W Contrast  Result Date: 08/22/2016 CLINICAL DATA:  Multiple trauma. The patient fell 12 feet from a ladder. EXAM: CT CHEST, ABDOMEN, AND PELVIS WITH CONTRAST TECHNIQUE: Multidetector CT imaging of the chest, abdomen and pelvis was performed following the standard protocol during bolus administration of intravenous contrast. CONTRAST:  100 cc Omnipaque 300 COMPARISON:  Pelvic radiograph dated 08/22/2016 and chest x-ray dated 08/22/2016 FINDINGS: CT CHEST FINDINGS Cardiovascular: Extensive aortic atherosclerosis. Extensive coronary artery calcifications. Prior CABG. Heart size is normal. Mediastinum/Nodes: 3.7 x 2.3 cm enlarged subcarinal lymph node. Thyroid gland, trachea, and esophagus demonstrate no significant findings. Lungs/Pleura: There is a spiculated 2.0 x 2.8 cm mass in the right middle lobe with soft tissue stranding to the periphery, consistent with a primary carcinoma the lung. There is a vague 7 mm area of abnormal density in the posterior aspect of the left upper lobe on image 47 of series 3. There are 2 small nodular densities in the right upper lobe on image number 31 of series 3, 1 is 4 mm  and the other is 2 mm. Musculoskeletal: No fractures or other acute abnormalities. CT ABDOMEN PELVIS FINDINGS Hepatobiliary: No focal liver abnormality is seen. Status post cholecystectomy. No biliary dilatation. Pancreas: Unremarkable. No pancreatic ductal dilatation or surrounding inflammatory changes. Spleen: No splenic injury or perisplenic hematoma. Adrenals/Urinary Tract: Adrenal glands are unremarkable. Kidneys are normal, without renal calculi, focal lesion, or hydronephrosis. There is retroperitoneal hemorrhage around the  left side of the bladder. Tear is factor Maxwell findings no hemorrhage contrecoup never mind anterior to a diameter question MR plaques are Stomach/Bowel: Stomach is within normal limits. Appendix appears normal. No evidence of bowel wall thickening, distention, or inflammatory changes. Vascular/Lymphatic: There is retroperitoneal hemorrhage in the pelvis particularly adjacent to the left common iliac artery. I suspect the bleeding is from the left iliac vein. There is blood in the retroperitoneum anterior to the prostate gland and bladder. No appreciable free intraperitoneal hemorrhage. Hemorrhagic contusion in the left buttock. Reproductive: Slight enlargement of the prostate gland. Other: There is hemorrhage along the left iliopsoas muscle. Musculoskeletal: There is diastases of the symphysis pubis and of the left sacroiliac joint. There are fractures of the left transverse processes of L2 through S1. IMPRESSION: 1. Disruption of the symphysis pubis and left sacroiliac joint with retroperitoneal hemorrhage in the left side of the pelvis and subcutaneous hemorrhage in the left buttock. I suspect there is an injury to the left external iliac vein. 2. Spiculated mass in the right middle lobe consistent with carcinoma the lung with metastatic adenopathy in the subcarinal region. 3. Several small pulmonary nodules which are too small to characterize but could represent metastases. 4. Fractures of the left transverse processes of L2 through S1. Electronically Signed   By: Lorriane Shire M.D.   On: 08/22/2016 16:19   Dg Pelvis Portable  Result Date: 08/25/2016 CLINICAL DATA:  Status post open reduction internal fixation of pelvic ring fracture. EXAM: PORTABLE PELVIS 1-2 VIEWS COMPARISON:  Radiographs of same day. FINDINGS: Status post surgical fixation of pubic symphysis. Also noted is surgical screw placement involving the left sacroiliac joint. No other fracture or dislocation is noted. Hip joints appear normal.  IMPRESSION: Status post surgical internal fixation of pubic symphysis and left sacroiliac joint. Electronically Signed   By: Marijo Conception, M.D.   On: 08/25/2016 11:45   Dg Pelvis Portable  Result Date: 08/22/2016 CLINICAL DATA:  Fall from roof.  Right flank and low back pain. EXAM: PORTABLE PELVIS 1-2 VIEWS COMPARISON:  07/13/2004 CT angiogram of the abdomen and pelvis. FINDINGS: There is abnormal widening at the pubic symphysis. There is mild 5 mm elevation of the right pubic bone relative to the left pubic bone at the pubic symphysis. No appreciable widening at the sacroiliac joints. Questionable vertical lucencies in the superior lateral sacral ala bilaterally. No evidence of hip dislocation on this single frontal view. No suspicious focal osseous lesion. Iliofemoral atherosclerosis bilaterally. IMPRESSION: 1. Mild diastasis and offset at the pubic symphysis suspicious for symphyseal disruption. 2. Questionable vertical lucencies in the superolateral sacral ala bilaterally, cannot exclude nondisplaced sacral fractures. No sacroiliac joint diastasis. 3. Recommend correlation with bony protocol pelvis CT. Electronically Signed   By: Ilona Sorrel M.D.   On: 08/22/2016 15:36   Dg Pelvis Comp Min 3v  Result Date: 08/25/2016 CLINICAL DATA:  ORIF pelvic fracture EXAM: JUDET PELVIS - 3+ VIEW; DG C-ARM 61-120 MIN COMPARISON:  None. FLUOROSCOPY TIME:  53 seconds FINDINGS: Six intraoperative fluoroscopic spot images are provided. Interval plate and screw  ORIF of symphyseal diastases. Interval placement of a cannulated screw transfixing the left sacroiliac joint. IMPRESSION: 1. Interval ORIF of the pubic symphysis and left SI joint. Electronically Signed   By: Kathreen Devoid   On: 08/25/2016 10:04   Dg Chest Port 1 View  Result Date: 09/02/2016 CLINICAL DATA:  Shortness of Breath EXAM: PORTABLE CHEST 1 VIEW COMPARISON:  August 22, 2016 chest radiograph and chest CT FINDINGS: There is patchy airspace opacity in each  lower lobe, likely multifocal pneumonia. There is cardiomegaly with pulmonary venous hypertension. No adenopathy. There is aortic atherosclerosis. Patient is status post coronary artery bypass grafting. No evident bone lesions. IMPRESSION: Pulmonary vascular congestion. Probable patchy pneumonia in each lung base. There appears to be slight increased opacity in these areas compared to recent chest radiograph. Cardiac silhouette is stable. There is aortic atherosclerosis. Electronically Signed   By: Lowella Grip III M.D.   On: 09/02/2016 11:38   Dg Chest Port 1 View  Result Date: 08/22/2016 CLINICAL DATA:  Right flank and lower back pain after falling from a ladder 12 feet. EXAM: PORTABLE CHEST 1 VIEW COMPARISON:  Chest x-ray dated 07/17/2013 FINDINGS: The heart size and pulmonary vascularity are normal and the lungs are clear. CABG. Aortic atherosclerosis. No acute bone abnormality. IMPRESSION: No acute abnormalities.  CABG. Aortic atherosclerosis. Electronically Signed   By: Lorriane Shire M.D.   On: 08/22/2016 15:33   Dg C-arm 1-60 Min  Result Date: 08/25/2016 CLINICAL DATA:  ORIF pelvic fracture EXAM: JUDET PELVIS - 3+ VIEW; DG C-ARM 61-120 MIN COMPARISON:  None. FLUOROSCOPY TIME:  53 seconds FINDINGS: Six intraoperative fluoroscopic spot images are provided. Interval plate and screw ORIF of symphyseal diastases. Interval placement of a cannulated screw transfixing the left sacroiliac joint. IMPRESSION: 1. Interval ORIF of the pubic symphysis and left SI joint. Electronically Signed   By: Kathreen Devoid   On: 08/25/2016 10:04      IMPRESSION/PLAN: 1. 76 y.o. 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 Today, I talked to the patient and his wife about the findings and work-up thus far.  We discussed the natural history of metastatic lung cancer with brain metastasis and general treatment, highlighting the role of radiotherapy in the management.  We discussed  the available radiation techniques, and focused on the details of logistics and delivery.  We reviewed the anticipated acute and late sequelae associated with radiation in this setting.  The patient was encouraged to ask questions that I answered to the best of my ability.  We discussed the need for further work up and evaluation for tissue confirmation and disease staging in order to formulate an appropriate treatment plan going forward. The plan currently is for outpatient evaluation of the lung mass.  The patient is agreeable to proceeding with 3T MRI with SRS protocol for further evaluation of the brain lesion and we will try to get this scheduled while he is in the hospital.  They understand that the timing of this procedure will depend on when his treatment team feels he is safe to transfer down to radiology for the exam.  I will place the order and we will follow up with them to review the results once they are available. Based on those results, we would likely plan to proceed with Sterotactic Radiosurgery and the patient would be scheduled for CT simulation in our department.  We would also make arrangements at that time for the patient to meet  with neurology to assist in his treatment and management of brain metastasis.   I spent 60 minutes minutes face to face with the patient and more than 50% of that time was spent in counseling and/or coordination of care.    Nicholos Johns, PA-C

## 2016-09-03 ENCOUNTER — Inpatient Hospital Stay (HOSPITAL_COMMUNITY): Payer: Medicare Other | Admitting: Physical Therapy

## 2016-09-03 DIAGNOSIS — R0602 Shortness of breath: Secondary | ICD-10-CM

## 2016-09-03 LAB — CBC
HCT: 24.6 % — ABNORMAL LOW (ref 39.0–52.0)
HEMOGLOBIN: 8.3 g/dL — AB (ref 13.0–17.0)
MCH: 30.2 pg (ref 26.0–34.0)
MCHC: 33.7 g/dL (ref 30.0–36.0)
MCV: 89.5 fL (ref 78.0–100.0)
PLATELETS: 240 10*3/uL (ref 150–400)
RBC: 2.75 MIL/uL — ABNORMAL LOW (ref 4.22–5.81)
RDW: 16.1 % — ABNORMAL HIGH (ref 11.5–15.5)
WBC: 26.9 10*3/uL — ABNORMAL HIGH (ref 4.0–10.5)

## 2016-09-03 LAB — HEPATIC FUNCTION PANEL
ALBUMIN: 2.2 g/dL — AB (ref 3.5–5.0)
ALT: 25 U/L (ref 17–63)
AST: 41 U/L (ref 15–41)
Alkaline Phosphatase: 110 U/L (ref 38–126)
Bilirubin, Direct: 0.7 mg/dL — ABNORMAL HIGH (ref 0.1–0.5)
Indirect Bilirubin: 1.4 mg/dL — ABNORMAL HIGH (ref 0.3–0.9)
Total Bilirubin: 2.1 mg/dL — ABNORMAL HIGH (ref 0.3–1.2)
Total Protein: 4.8 g/dL — ABNORMAL LOW (ref 6.5–8.1)

## 2016-09-03 LAB — BASIC METABOLIC PANEL
Anion gap: 9 (ref 5–15)
BUN: 29 mg/dL — ABNORMAL HIGH (ref 6–20)
CO2: 26 mmol/L (ref 22–32)
Calcium: 7.9 mg/dL — ABNORMAL LOW (ref 8.9–10.3)
Chloride: 96 mmol/L — ABNORMAL LOW (ref 101–111)
Creatinine, Ser: 1.15 mg/dL (ref 0.61–1.24)
GFR calc Af Amer: >60 mL/min (ref >60)
GFR, EST NON AFRICAN AMERICAN: 59 mL/min — AB (ref >60)
Glucose, Bld: 134 mg/dL — ABNORMAL HIGH (ref 65–99)
Potassium: 3.6 mmol/L (ref 3.5–5.1)
Sodium: 131 mmol/L — ABNORMAL LOW (ref 135–145)

## 2016-09-03 LAB — PROTIME-INR
INR: 1.28
PROTHROMBIN TIME: 16.1 s — AB (ref 11.4–15.2)

## 2016-09-03 LAB — CORTISOL: Cortisol, Plasma: 19.4 ug/dL

## 2016-09-03 MED ORDER — VANCOMYCIN HCL IN DEXTROSE 750-5 MG/150ML-% IV SOLN
750.0000 mg | Freq: Two times a day (BID) | INTRAVENOUS | Status: DC
  Administered 2016-09-03 – 2016-09-08 (×10): 750 mg via INTRAVENOUS
  Filled 2016-09-03 (×12): qty 150

## 2016-09-03 MED ORDER — VANCOMYCIN HCL IN DEXTROSE 1-5 GM/200ML-% IV SOLN
1000.0000 mg | Freq: Two times a day (BID) | INTRAVENOUS | Status: DC
  Administered 2016-09-03 (×2): 1000 mg via INTRAVENOUS
  Filled 2016-09-03 (×2): qty 200

## 2016-09-03 MED ORDER — WARFARIN SODIUM 7.5 MG PO TABS
7.5000 mg | ORAL_TABLET | Freq: Once | ORAL | Status: AC
  Administered 2016-09-03: 7.5 mg via ORAL
  Filled 2016-09-03: qty 1

## 2016-09-03 MED ORDER — FLEET ENEMA 7-19 GM/118ML RE ENEM
1.0000 | ENEMA | Freq: Once | RECTAL | Status: AC
  Administered 2016-09-03: 1 via RECTAL
  Filled 2016-09-03: qty 1

## 2016-09-03 NOTE — Progress Notes (Signed)
Physical Therapy Session Note  Patient Details  Name: BRYTON ROMAGNOLI MRN: [id number] Date of Birth: 12/29/1940  Today's Date: 09/03/2016 PT Individual Time: 1500-1515 PT Individual Time Calculation (min): 15 min   Short Term Goals: Week 1:  PT Short Term Goal 1 (Week 1): = LTGs due to LOS  Skilled Therapeutic Interventions/Progress Updates:  Pt was seen bedside in the pm. Pt was seen with nurse at bedside, vitals as below.  Pt fatigued but willing to participate with bed level exercises. Pt performed 2 sets x 10 reps each heel slides, hip abd/add, and SAQs. Following exercises pt deferred any further treatment secondary to fatigue. Pt left with head of bed elevated and call bell within reach.   Therapy Documentation Precautions:  Precautions Precautions: Fall Restrictions Weight Bearing Restrictions: Yes RLE Weight Bearing: Weight bearing as tolerated LLE Weight Bearing: Non weight bearing Other Position/Activity Restrictions: bed to chair only for 8 weeks; WBAT RLE for transfers only General: PT Amount of Missed Time (min): 30 Minutes PT Missed Treatment Reason: Pain;Patient fatigue Vital Signs: Therapy Vitals Temp: 99 F (37.2 C) Temp Source: Oral Pulse Rate: 81 BP: (!) 115/50 Patient Position (if appropriate): Lying Oxygen Therapy SpO2: 97 % O2 Device: Not Delivered Pain: Pt will generalized c/o pain all over.   See Function Navigator for Current Functional Status.   Therapy/Group: Individual Therapy  Dub Amis 09/03/2016, 3:09 PM

## 2016-09-03 NOTE — Progress Notes (Signed)
Patient still c/o constipation, had passed mostly  liquid stools twice during the night shift. Tap water enema completed per MD order. Patient had a medium bowel movement that was formed after enema. He says he felt much better.

## 2016-09-03 NOTE — Progress Notes (Signed)
Gave enema in am. Got patient to [facility] to attempt bowel movement. Only small volume thick liquid stool. Patient had small amount liquid stool in brief. Bowel sounds present in all 4 quadrants. Has sensation of passing gas. Sorbitol given in PM. Will continue to monitor and report to oncoming RN.

## 2016-09-03 NOTE — Progress Notes (Addendum)
EXAVIOR KIMMONS is a 76 y.o. male Dec 24, 1940 [id number]  Subjective: c/o constipation. Fever and somnolence. Low BP last night  Objective: Vital signs in last 24 hours: Temp:  [97.5 F (36.4 C)-101.9 F (38.8 C)] 97.5 F (36.4 C) (03/17 0506) Pulse Rate:  [62-111] 75 (03/17 0830) Resp:  [17-18] 17 (03/17 0506) BP: (92-157)/(36-106) 98/37 (03/17 0830) SpO2:  [96 %-97 %] 97 % (03/17 0506) Weight change:  Last BM Date: 08/29/16  Intake/Output from previous day: 03/16 0701 - 03/17 0700 In: 120 [P.O.:120] Out: 1125 [Urine:1125] Last cbgs: CBG (last 3)  No results for input(s): GLUCAP in the last 72 hours.   Physical Exam General: No apparent distress - appears chronically ill HEENT: not dry Lungs: Normal effort. Lungs clear to auscultation w/decr BS, no  wheezes. Cardiovascular: irregr rate and rhythm, mild edema Abdomen: S/NT/ND; BS(+); scrotal edema Musculoskeletal:  unchanged Neurological: No new neurological deficits Wounds: clean  Skin: clear  Aging changes Mental state: Alert, cooperative. Easy to awake    Lab Results: BMET    Component Value Date/Time   NA 131 (L) 09/03/2016 0119   NA 142 03/26/2016 0801   K 3.6 09/03/2016 0119   CL 96 (L) 09/03/2016 0119   CO2 26 09/03/2016 0119   GLUCOSE 134 (H) 09/03/2016 0119   BUN 29 (H) 09/03/2016 0119   BUN 16 03/26/2016 0801   CREATININE 1.15 09/03/2016 0119   CREATININE 0.98 06/28/2014 0819   CALCIUM 7.9 (L) 09/03/2016 0119   GFRNONAA 59 (L) 09/03/2016 0119   GFRAA >60 09/03/2016 0119   CBC    Component Value Date/Time   WBC 26.9 (H) 09/03/2016 0119   RBC 2.75 (L) 09/03/2016 0119   HGB 8.3 (L) 09/03/2016 0119   HCT 24.6 (L) 09/03/2016 0119   PLT 240 09/03/2016 0119   MCV 89.5 09/03/2016 0119   MCH 30.2 09/03/2016 0119   MCHC 33.7 09/03/2016 0119   RDW 16.1 (H) 09/03/2016 0119   LYMPHSABS 1.1 09/01/2016 0427   MONOABS 0.5 09/01/2016 0427   EOSABS 0.1 09/01/2016 0427   BASOSABS 0.0 09/01/2016 0427     Studies/Results: Mr Brain W Wo Contrast  Result Date: 09/02/2016 CLINICAL DATA:  76 y/o  M; SRS protocol for metastatic lung cancer. EXAM: MRI HEAD WITHOUT AND WITH CONTRAST TECHNIQUE: Multiplanar, multiecho pulse sequences of the brain and surrounding structures were obtained without and with intravenous contrast. CONTRAST:  89m MULTIHANCE GADOBENATE DIMEGLUMINE 529 MG/ML IV SOLN COMPARISON:  08/22/2016 CT of the head. FINDINGS: Brain: Left frontal lesion on CT demonstrates gyriform T1 shortening and susceptibility blooming as well as a small region of T2 FLAIR hyperintense signal abnormality in white matter, and no appreciable enhancement after administration of intravenous contrast. No additional focus of abnormal enhancement in the brain is identified. Mild motion degradation of postcontrast sequences. A diffusion weighted imaging there are several foci of diffusion restriction within the right frontal and parietal lobe in a "Border zone" distribution. There is a small subdural collection over the left cerebral convexity with incomplete FLAIR suppression, T1 shortening, and diffusion restriction measuring up to 3 mm in thickness compatible with a subdural hematoma. No significant mass effect on the brain. This is not appreciable on the prior CT of the head and may represent interval hemorrhage. No hydrocephalus. Cavum septum pellucidum. Few punctate foci of susceptibility in the right frontal lobe, right parietal lobe, and right cerebellar hemisphere are compatible with hemosiderin deposition of old microhemorrhage. Vascular: Normal flow voids. Skull and upper cervical  spine: Normal marrow signal. Sinuses/Orbits: Negative. Other: None. IMPRESSION: 1. Left frontal lesion on CT demonstrate gyriform T1 shortening and susceptibility blooming without appreciable enhancement on MRI. Findings favor a subacute to chronic hemorrhagic infarct or cortical contusion. Follow-up MRI is recommended with contrast to  exclude underlying hemorrhagic metastasis. 2. No definite enhancing lesion of the brain identified. Moderate motion degradation of postcontrast sequences. 3. Small foci of diffusion restriction in the right frontal and parietal lobes in "border zone" distribution compatible with acute/early subacute infarction. The pattern suggests hypoxia/hypoperfusion. Sequelae of trauma with shear injury is also possible, although no associate hemorrhage is identified. 4. Small subdural hematoma over the left cerebral convexity without significant mass effect, not appreciable on prior CT, may represent interval hemorrhage. These results will be called to the ordering clinician or representative by the Radiologist Assistant, and communication documented in the PACS or zVision Dashboard. Electronically Signed   By: Kristine Garbe M.D.   On: 09/02/2016 18:13   Dg Chest Port 1 View  Result Date: 09/02/2016 CLINICAL DATA:  Shortness of Breath EXAM: PORTABLE CHEST 1 VIEW COMPARISON:  August 22, 2016 chest radiograph and chest CT FINDINGS: There is patchy airspace opacity in each lower lobe, likely multifocal pneumonia. There is cardiomegaly with pulmonary venous hypertension. No adenopathy. There is aortic atherosclerosis. Patient is status post coronary artery bypass grafting. No evident bone lesions. IMPRESSION: Pulmonary vascular congestion. Probable patchy pneumonia in each lung base. There appears to be slight increased opacity in these areas compared to recent chest radiograph. Cardiac silhouette is stable. There is aortic atherosclerosis. Electronically Signed   By: Lowella Grip III M.D.   On: 09/02/2016 11:38    Medications: I have reviewed the patient's current medications.  Assessment/Plan:  1. Closed pelvic ring fx - CIR 2. ORIF L SI joint s/p 3. Acute DVT: Lovenox/Coumadin 3. HTN: Lopressor, Maxzide - both on hold 4. CAD - ASA, coumadin 5. A fib - coumadin. Lopressor is held due to low BP 6.  PVD 7. Pneumonia - Rocephin; Vanc added last night 8. Low BP - IV abx, NS bolus, IVF 9. Constipation - enema given 10. Insomnia - Trazodone -  May need to hold 11. RML mass: Spiculated mass in the right middle lobe consistent with carcinoma the lung with metastatic adenopathy in the subcarinal region. 12. Frontal lobe brain mass - will need an additional w/up (MRI - CVA vs met vs contusion)       Follow-up MRI is recommended with contrast to exclude       underlying hemorrhagic metastasis. 13. Small L subdural hematoma - d/c ASA 14. Malnutrition - low albumin  Spoke w/pt and w/wife   Length of stay, days: 3  Walker Kehr , MD 09/03/2016, 10:01 AM

## 2016-09-03 NOTE — Progress Notes (Signed)
Pharmacy Antibiotic Note  [name] [name] is a 76 y.o. male admitted on 08/31/2016 with pneumonia.  Pharmacy has been consulted for Vancomycin dosing. Already on Ceftriaxone, now spiking fever. WBC trending up. Having some hypotension. Renal function ok.   Plan: Vancomycin 1000 mg IV q12h Already on Ceftriaxone  Trend WBC, temp, renal function  F/U infectious work-up Drug levels as indicated  Height: '5\' 6"'$  (167.6 cm) Weight: 185 lb 6.4 oz (84.1 kg) IBW/kg (Calculated) : 63.8  Temp (24hrs), Avg:99.8 F (37.7 C), Min:97.8 F (36.6 C), Max:101.9 F (38.8 C)   Recent Labs Lab 08/27/16 0402  08/28/16 1135 08/30/16 0724 08/31/16 0714 09/01/16 0427 09/02/16 0608  WBC 7.8  < > 8.0 8.2 7.2 6.9 10.6*  CREATININE 0.64  --  0.64  --   --  0.77  --   < > = values in this interval not displayed.  Estimated Creatinine Clearance: 77.4 mL/min (by C-G formula based on SCr of 0.77 mg/dL).    Allergies  Allergen Reactions  . Imitrex [Sumatriptan] Nausea And Vomiting and Other (See Comments)    "made me like I was having a stoke"  . Lipitor [Atorvastatin] Nausea And Vomiting and Other (See Comments)    INTOLERANCE > MYALGIAS "couldn't get out of the bed ( pt had to crawl out of bed ) I hurt so bad"    Narda Bonds 09/03/2016 12:01 AM

## 2016-09-03 NOTE — Progress Notes (Signed)
ANTICOAGULATION CONSULT NOTE - Follow Up Consult  Pharmacy Consult for Warfarin/Lovenox Indication: Acute bilateral DVT  Patient Measurements: Height: '5\' 6"'$  (167.6 cm) Weight: 185 lb 6.4 oz (84.1 kg) IBW/kg (Calculated) : 63.8  Vital Signs: Temp: 97.5 F (36.4 C) (03/17 0506) Temp Source: Oral (03/17 0506) BP: 98/37 (03/17 0830) Pulse Rate: 75 (03/17 0830)  Labs:  Recent Labs  09/01/16 0427 09/02/16 0608 09/03/16 0119  HGB 9.2* 9.6* 8.3*  HCT 26.6* 28.5* 24.6*  PLT 185 250 240  LABPROT  --   --  16.1*  INR  --   --  1.28  CREATININE 0.77  --  1.15    Estimated Creatinine Clearance: 53.8 mL/min (by C-G formula based on SCr of 1.15 mg/dL).  Assessment: 76 yo M on treatment dose Lovenox for acute bilateral DVTs, started warfarin 3/16. Today is day #2/5 of bridge. INR subtherapeutic today at 1.28. Hgb 9.6>8.3, Plt wnl. Small L subdural hematoma per MD note.  Goal of Therapy:  INR 2-3 Monitor platelets by anticoagulation protocol: Yes   Plan:  -Continue Lovenox 85 mg [place] q12h -Warfarin 7.5 mg PO x 1  -Daily PT/INR -Monitor CBC, worsening hematoma   Gwenlyn Perking, PharmD PGY1 Pharmacy Resident Pager: [phone] 09/03/2016 10:34 AM

## 2016-09-03 NOTE — Progress Notes (Addendum)
Pharmacy Antibiotic Note  [name] [name] is a 76 y.o. male admitted on 08/31/2016 with pneumonia.  Pharmacy has been consulted for Vancomycin dosing. Already on Ceftriaxone, now spiking fever and WBC trending up. SCr with ~50% increase from 0.77 to 1.15.  Plan: Change vancomycin to 750 mg IV q12h Continue ceftriaxone 1g IV q24h Monitor renal function closely Obtain vanc trough at steady state  Height: '5\' 6"'$  (167.6 cm) Weight: 185 lb 6.4 oz (84.1 kg) IBW/kg (Calculated) : 63.8  Temp (24hrs), Avg:99.7 F (37.6 C), Min:97.5 F (36.4 C), Max:101.9 F (38.8 C)   Recent Labs Lab 08/28/16 1135 08/30/16 0724 08/31/16 0714 09/01/16 0427 09/02/16 0608 09/03/16 0119  WBC 8.0 8.2 7.2 6.9 10.6* 26.9*  CREATININE 0.64  --   --  0.77  --  1.15    Estimated Creatinine Clearance: 53.8 mL/min (by C-G formula based on SCr of 1.15 mg/dL).    Antimicrobials this admission:  3/16 CTX >> 3/17 vanc>>  Dose adjustments this admission:  3/17 vanc '1000mg'$  q12h >> 750 mg q12  Microbiology results:  3/5 MRSA PCR: neg 3/16 UCx: sent   Gwenlyn Perking, PharmD PGY1 Pharmacy Resident Pager: [phone] 09/03/2016 10:31 AM

## 2016-09-04 ENCOUNTER — Inpatient Hospital Stay (HOSPITAL_COMMUNITY): Payer: Medicare Other | Admitting: Physical Therapy

## 2016-09-04 ENCOUNTER — Inpatient Hospital Stay (HOSPITAL_COMMUNITY): Payer: Medicare Other | Admitting: Occupational Therapy

## 2016-09-04 LAB — BASIC METABOLIC PANEL
Anion gap: 8 (ref 5–15)
BUN: 25 mg/dL — ABNORMAL HIGH (ref 6–20)
CALCIUM: 7.7 mg/dL — AB (ref 8.9–10.3)
CO2: 23 mmol/L (ref 22–32)
CREATININE: 0.87 mg/dL (ref 0.61–1.24)
Chloride: 96 mmol/L — ABNORMAL LOW (ref 101–111)
GFR calc non Af Amer: >60 mL/min (ref >60)
GLUCOSE: 128 mg/dL — AB (ref 65–99)
Potassium: 3.7 mmol/L (ref 3.5–5.1)
Sodium: 127 mmol/L — ABNORMAL LOW (ref 135–145)

## 2016-09-04 LAB — PROTIME-INR
INR: 1.45
PROTHROMBIN TIME: 17.7 s — AB (ref 11.4–15.2)

## 2016-09-04 LAB — URINE CULTURE: Culture: >=100000 — AB

## 2016-09-04 MED ORDER — LINACLOTIDE 145 MCG PO CAPS
145.0000 ug | ORAL_CAPSULE | Freq: Every day | ORAL | Status: DC
  Administered 2016-09-04 – 2016-09-16 (×12): 145 ug via ORAL
  Filled 2016-09-04 (×13): qty 1

## 2016-09-04 MED ORDER — FUROSEMIDE 10 MG/ML IJ SOLN
40.0000 mg | Freq: Once | INTRAMUSCULAR | Status: AC
  Administered 2016-09-04: 40 mg via INTRAVENOUS
  Filled 2016-09-04: qty 4

## 2016-09-04 MED ORDER — WARFARIN SODIUM 7.5 MG PO TABS
7.5000 mg | ORAL_TABLET | Freq: Once | ORAL | Status: AC
  Administered 2016-09-04: 7.5 mg via ORAL
  Filled 2016-09-04: qty 1

## 2016-09-04 NOTE — Progress Notes (Signed)
ANTICOAGULATION CONSULT NOTE - Follow Up Consult  Pharmacy Consult for Warfarin/Lovenox Indication: Acute bilateral DVT  Patient Measurements: Height: '5\' 6"'$  (167.6 cm) Weight: 185 lb 6.4 oz (84.1 kg) IBW/kg (Calculated) : 63.8  Vital Signs: Temp: 99 F (37.2 C) (03/18 0506) Temp Source: Oral (03/18 0506) BP: 124/54 (03/18 0506) Pulse Rate: 76 (03/18 0506)  Labs:  Recent Labs  09/02/16 0608 09/03/16 0119 09/04/16 0537  HGB 9.6* 8.3*  --   HCT 28.5* 24.6*  --   PLT 250 240  --   LABPROT  --  16.1* 17.7*  INR  --  1.28 1.45  CREATININE  --  1.15 0.87    Estimated Creatinine Clearance: 71.2 mL/min (by C-G formula based on SCr of 0.87 mg/dL).  Assessment: 76 yo M on treatment dose Lovenox for acute bilateral DVTs, started warfarin 3/16. Today is day #3/5 of bridge. INR still subtherapeutic but increased today to 1.45. No CBC today. Small L subdural hematoma per MD note.  Goal of Therapy:  INR 2-3 Monitor platelets by anticoagulation protocol: Yes   Plan:  -Continue Lovenox 85 mg [place] q12h -Warfarin 7.5 mg PO x 1  -Daily PT/INR -Monitor CBC, worsening hematoma   Gwenlyn Perking, PharmD PGY1 Pharmacy Resident Pager: [phone] 09/04/2016 7:56 AM

## 2016-09-04 NOTE — Consult Note (Signed)
Reason for Consult:Lung nodule with subcarinal adenopathy Referring Physician: Dr. Berniece Salines is an 76 y.o. male.  HPI: 53 yo man with a complex medical history including remote tobacco abuse, CAD s/p CABG by EBG in 1995, [place] with left CEA, AAA repair in 1994 and atrial fibrillation. He was admitted on 08/22/16 after falling about 10 feet off a ladder. He had pelvic fractures requiring ORIF.   His work up also revealed a 2 x 2.8 cm right middle lobe nodule with massive subcarinal adenopathy. There was a question of a brain met on CT head but MR suggests it is a stroke rather than a met.  Admitted to rehab in 08/31/16. 3/15 duplex showed calf vein DVT bilaterally. Started on therapeutic enoxaparin.  Past Medical History:  Diagnosis Date  . Atrial fibrillation ([place])    no hx of reported at preop visit of 04/21/11   . CAD (coronary artery disease)   . Carotid artery occlusion    left carotid endarterectomy  . Chronic kidney disease    AAA repair with reimplant of renals   . CHRONIC OBSTRUCTIVE PULMONARY DISEASE    pt denied at visit of 04/21/11   . CORONARY ARTERY DISEASE   . Disorder of left sacroiliac joint 08/24/2016  . DIVERTICULOSIS OF COLON   . GERD   . H/O hiatal hernia   . Headache(784.0)    Hx: of years of years  . HYPERTENSION   . HYPOTHYROIDISM   . JOINT EFFUSION, KNEE   . KNEE, ARTHRITIS, DEGEN./OSTEO    right knee   . Myocardial infarction    1994   . NEPHROLITHIASIS, HX OF   . OSTEOPOROSIS   . Other dysphagia   . PERIPHERAL VASCULAR DISEASE    AAA - 1994 with reimplant of renals   . Peripheral vascular disease ([place])    subclavian stenosis PTA - 3/08   . Pure hypercholesterolemia   . Renal artery stenosis [facility])     Past Surgical History:  Procedure Laterality Date  . ABDOMINAL AORTIC ANEURYSM REPAIR     with reimplantation of renals   . CARDIAC CATHETERIZATION     2008  . CAROTID ENDARTERECTOMY Left Jan. 30, 2015   CEA  . CHOLECYSTECTOMY   2000   Gall Bladder  . COLONOSCOPY W/ BIOPSIES AND POLYPECTOMY     Hx: of  . CORONARY ARTERY BYPASS GRAFT     1995  . CORONARY STENT PLACEMENT     Hx: of  . ENDARTERECTOMY Left 07/19/2013   Procedure: ENDARTERECTOMY CAROTID;  Surgeon: Mal Misty, MD;  Location: [place];  Service: Vascular;  Laterality: Left;  . GALLBLADDER SURGERY  2004   . JOINT REPLACEMENT     partial knee replacement on left 2002   . KNEE SURGERY    . ORIF PELVIC FRACTURE Left 08/25/2016   Procedure: OPEN REDUCTION INTERNAL FIXATION (ORIF) PELVIC FRACTURE; plate on front, SI screw on the back;  Surgeon: Altamese Thomaston, MD;  Location: [place];  Service: Orthopedics;  Laterality: Left;  . OTHER SURGICAL HISTORY     left subclavian stenosis surgery PTA 08/2006   . OTHER SURGICAL HISTORY     carotid surgery on right 2004   . TOTAL KNEE ARTHROPLASTY  04/29/2011   Procedure: TOTAL KNEE ARTHROPLASTY;  Surgeon: Gearlean Alf;  Location: [facility];  Service: Orthopedics;  Laterality: Right;  [name] VASCULAR SURGERY     AAA  . [place] History  Problem Relation Age of Onset  . Hypertension Mother   . Heart disease Father   . Heart attack Father   . Cancer Brother     Lung  . Diabetes Sister     Social History:  reports that he quit smoking about 24 years ago. His smoking use included Cigarettes. He has never used smokeless tobacco. He reports that he does not drink alcohol or use drugs.  Allergies:  Allergies  Allergen Reactions  . Imitrex [Sumatriptan] Nausea And Vomiting and Other (See Comments)    "made me like I was having a stoke"  . Lipitor [Atorvastatin] Nausea And Vomiting and Other (See Comments)    INTOLERANCE > MYALGIAS "couldn't get out of the bed ( pt had to crawl out of bed ) I hurt so bad"    Medications:  Scheduled: . cefTRIAXone (ROCEPHIN)  IV  1 g Intravenous Q24H  . docusate sodium  100 mg Oral BID  . enoxaparin (LOVENOX) injection  1 mg/kg Subcutaneous Q12H  . feeding supplement  (PRO-STAT SUGAR FREE 64)  30 mL Oral BID  . furosemide  40 mg Intravenous Once  . levothyroxine  112 mcg Oral QAC breakfast  . linaclotide  145 mcg Oral QAC breakfast  . metoprolol  75 mg Oral BID  . pantoprazole  40 mg Oral Daily  . rosuvastatin  20 mg Oral q1800  . senna  1 tablet Oral Daily  . tamsulosin  0.4 mg Oral Daily  . triamterene-hydrochlorothiazide  1 tablet Oral Daily  . vancomycin  750 mg Intravenous Q12H  . warfarin  7.5 mg Oral ONCE-1800  . Warfarin - Pharmacist Dosing Inpatient   Does not apply q1800    Results for orders placed or performed during the hospital encounter of 08/31/16 (from the past 48 hour(s))  Urinalysis, Complete w Microscopic     Status: Abnormal   Collection Time: 09/02/16 12:00 PM  Result Value Ref Range   Color, Urine AMBER (A) YELLOW    Comment: BIOCHEMICALS MAY BE AFFECTED BY COLOR   APPearance CLOUDY (A) CLEAR   Specific Gravity, Urine 1.009 1.005 - 1.030   pH 7.0 5.0 - 8.0   Glucose, UA NEGATIVE NEGATIVE mg/dL   Hgb urine dipstick LARGE (A) NEGATIVE   Bilirubin Urine NEGATIVE NEGATIVE   Ketones, ur NEGATIVE NEGATIVE mg/dL   Protein, ur 30 (A) NEGATIVE mg/dL   Nitrite NEGATIVE NEGATIVE   Leukocytes, UA LARGE (A) NEGATIVE   RBC / HPF TOO NUMEROUS TO COUNT 0 - 5 RBC/hpf   WBC, UA TOO NUMEROUS TO COUNT 0 - 5 WBC/hpf   Bacteria, UA FEW (A) NONE SEEN   Squamous Epithelial / LPF NONE SEEN NONE SEEN   WBC Clumps PRESENT   Urine culture     Status: Abnormal   Collection Time: 09/02/16 12:02 PM  Result Value Ref Range   Specimen Description URINE, CATHETERIZED    Special Requests NONE    Culture >=100,000 COLONIES/mL ENTEROBACTER AEROGENES (A)    Report Status 09/04/2016 FINAL    Organism ID, Bacteria ENTEROBACTER AEROGENES (A)       Susceptibility   Enterobacter aerogenes - MIC*    CEFAZOLIN >=64 RESISTANT Resistant     CEFTRIAXONE <=1 SENSITIVE Sensitive     CIPROFLOXACIN <=0.25 SENSITIVE Sensitive     GENTAMICIN <=1 SENSITIVE  Sensitive     IMIPENEM 0.5 SENSITIVE Sensitive     NITROFURANTOIN 64 INTERMEDIATE Intermediate     TRIMETH/SULFA <=20 SENSITIVE Sensitive  PIP/TAZO <=4 SENSITIVE Sensitive     * >=100,000 COLONIES/mL ENTEROBACTER AEROGENES  Protime-INR     Status: Abnormal   Collection Time: 09/03/16  1:19 AM  Result Value Ref Range   Prothrombin Time 16.1 (H) 11.4 - 15.2 seconds   INR 1.28   CBC     Status: Abnormal   Collection Time: 09/03/16  1:19 AM  Result Value Ref Range   WBC 26.9 (H) 4.0 - 10.5 K/uL   RBC 2.75 (L) 4.22 - 5.81 MIL/uL   Hemoglobin 8.3 (L) 13.0 - 17.0 g/dL   HCT 24.6 (L) 39.0 - 52.0 %   MCV 89.5 78.0 - 100.0 fL   MCH 30.2 26.0 - 34.0 pg   MCHC 33.7 30.0 - 36.0 g/dL   RDW 16.1 (H) 11.5 - 15.5 %   Platelets 240 150 - 400 K/uL  Basic metabolic panel     Status: Abnormal   Collection Time: 09/03/16  1:19 AM  Result Value Ref Range   Sodium 131 (L) 135 - 145 mmol/L   Potassium 3.6 3.5 - 5.1 mmol/L   Chloride 96 (L) 101 - 111 mmol/L   CO2 26 22 - 32 mmol/L   Glucose, Bld 134 (H) 65 - 99 mg/dL   BUN 29 (H) 6 - 20 mg/dL   Creatinine, Ser 1.15 0.61 - 1.24 mg/dL   Calcium 7.9 (L) 8.9 - 10.3 mg/dL   GFR calc non Af Amer 59 (L) >60 mL/min   GFR calc Af Amer >60 >60 mL/min    Comment: (NOTE) The eGFR has been calculated using the CKD EPI equation. This calculation has not been validated in all clinical situations. eGFR's persistently <60 mL/min signify possible Chronic Kidney Disease.    Anion gap 9 5 - 15  Hepatic function panel     Status: Abnormal   Collection Time: 09/03/16  1:19 AM  Result Value Ref Range   Total Protein 4.8 (L) 6.5 - 8.1 g/dL   Albumin 2.2 (L) 3.5 - 5.0 g/dL   AST 41 15 - 41 U/L   ALT 25 17 - 63 U/L   Alkaline Phosphatase 110 38 - 126 U/L   Total Bilirubin 2.1 (H) 0.3 - 1.2 mg/dL   Bilirubin, Direct 0.7 (H) 0.1 - 0.5 mg/dL   Indirect Bilirubin 1.4 (H) 0.3 - 0.9 mg/dL  Cortisol     Status: None   Collection Time: 09/03/16  1:19 AM  Result Value  Ref Range   Cortisol, Plasma 19.4 ug/dL    Comment: (NOTE) AM    6.7 - 22.6 ug/dL PM   <10.0       ug/dL   Protime-INR     Status: Abnormal   Collection Time: 09/04/16  5:37 AM  Result Value Ref Range   Prothrombin Time 17.7 (H) 11.4 - 15.2 seconds   INR 8.12   Basic metabolic panel     Status: Abnormal   Collection Time: 09/04/16  5:37 AM  Result Value Ref Range   Sodium 127 (L) 135 - 145 mmol/L   Potassium 3.7 3.5 - 5.1 mmol/L   Chloride 96 (L) 101 - 111 mmol/L   CO2 23 22 - 32 mmol/L   Glucose, Bld 128 (H) 65 - 99 mg/dL   BUN 25 (H) 6 - 20 mg/dL   Creatinine, Ser 0.87 0.61 - 1.24 mg/dL   Calcium 7.7 (L) 8.9 - 10.3 mg/dL   GFR calc non Af Amer >60 >60 mL/min   GFR calc Af Amer >  60 >60 mL/min    Comment: (NOTE) The eGFR has been calculated using the CKD EPI equation. This calculation has not been validated in all clinical situations. eGFR's persistently <60 mL/min signify possible Chronic Kidney Disease.    Anion gap 8 5 - 15    Mr Jeri Cos Wo Contrast  Result Date: 09/02/2016 CLINICAL DATA:  76 y/o  M; SRS protocol for metastatic lung cancer. EXAM: MRI HEAD WITHOUT AND WITH CONTRAST TECHNIQUE: Multiplanar, multiecho pulse sequences of the brain and surrounding structures were obtained without and with intravenous contrast. CONTRAST:  25m MULTIHANCE GADOBENATE DIMEGLUMINE 529 MG/ML IV SOLN COMPARISON:  08/22/2016 CT of the head. FINDINGS: Brain: Left frontal lesion on CT demonstrates gyriform T1 shortening and susceptibility blooming as well as a small region of T2 FLAIR hyperintense signal abnormality in white matter, and no appreciable enhancement after administration of intravenous contrast. No additional focus of abnormal enhancement in the brain is identified. Mild motion degradation of postcontrast sequences. A diffusion weighted imaging there are several foci of diffusion restriction within the right frontal and parietal lobe in a "Border zone" distribution. There is a  small subdural collection over the left cerebral convexity with incomplete FLAIR suppression, T1 shortening, and diffusion restriction measuring up to 3 mm in thickness compatible with a subdural hematoma. No significant mass effect on the brain. This is not appreciable on the prior CT of the head and may represent interval hemorrhage. No hydrocephalus. Cavum septum pellucidum. Few punctate foci of susceptibility in the right frontal lobe, right parietal lobe, and right cerebellar hemisphere are compatible with hemosiderin deposition of old microhemorrhage. Vascular: Normal flow voids. Skull and upper cervical spine: Normal marrow signal. Sinuses/Orbits: Negative. Other: None. IMPRESSION: 1. Left frontal lesion on CT demonstrate gyriform T1 shortening and susceptibility blooming without appreciable enhancement on MRI. Findings favor a subacute to chronic hemorrhagic infarct or cortical contusion. Follow-up MRI is recommended with contrast to exclude underlying hemorrhagic metastasis. 2. No definite enhancing lesion of the brain identified. Moderate motion degradation of postcontrast sequences. 3. Small foci of diffusion restriction in the right frontal and parietal lobes in "border zone" distribution compatible with acute/early subacute infarction. The pattern suggests hypoxia/hypoperfusion. Sequelae of trauma with shear injury is also possible, although no associate hemorrhage is identified. 4. Small subdural hematoma over the left cerebral convexity without significant mass effect, not appreciable on prior CT, may represent interval hemorrhage. These results will be called to the ordering clinician or representative by the Radiologist Assistant, and communication documented in the PACS or zVision Dashboard. Electronically Signed   By: LKristine GarbeM.D.   On: 09/02/2016 18:13   Dg Chest Port 1 View  Result Date: 09/02/2016 CLINICAL DATA:  Shortness of Breath EXAM: PORTABLE CHEST 1 VIEW COMPARISON:   August 22, 2016 chest radiograph and chest CT FINDINGS: There is patchy airspace opacity in each lower lobe, likely multifocal pneumonia. There is cardiomegaly with pulmonary venous hypertension. No adenopathy. There is aortic atherosclerosis. Patient is status post coronary artery bypass grafting. No evident bone lesions. IMPRESSION: Pulmonary vascular congestion. Probable patchy pneumonia in each lung base. There appears to be slight increased opacity in these areas compared to recent chest radiograph. Cardiac silhouette is stable. There is aortic atherosclerosis. Electronically Signed   By: WLowella GripIII M.D.   On: 09/02/2016 11:38    Review of Systems  Eyes: Negative for blurred vision and double vision.  Respiratory: Positive for cough and shortness of breath.   Cardiovascular: Negative for chest  pain.  Gastrointestinal: Positive for constipation.  Neurological: Positive for weakness. Negative for seizures and loss of consciousness.   Blood pressure (!) 124/54, pulse 76, temperature 99 F (37.2 C), temperature source Oral, resp. rate 18, height '5\' 6"'  (1.676 m), weight 185 lb 6.4 oz (84.1 kg), SpO2 98 %. Physical Exam  Vitals reviewed. Constitutional: He appears well-developed and well-nourished. No distress.  Elderly pleasant gentleman  HENT:  Head: Normocephalic.  Mouth/Throat: No oropharyngeal exudate.  Eyes: Conjunctivae and EOM are normal. No scleral icterus.  Neck: Neck supple.  Cardiovascular: Normal rate and regular rhythm.   Murmur (2/6 systolic) heard. Respiratory: Effort normal. No respiratory distress.  diminished BS bibasilar  GI: Soft. He exhibits no distension.  Lymphadenopathy:    He has no cervical adenopathy.  Neurological: He is alert. No cranial nerve deficit.  Skin: Skin is warm and dry.    Assessment/Plan: 77 yo man with a history of tobacco abuse who fell from a ladder suffering multiple fractures. CT chest showed a spiculated right middle lobe nodule  with subcarinal adenopathy. Head CT showed possible brain mets although MR suggests it might be a stroke. He has been diagnosed with DVT while in the hospital.  Lung nodule with subcarinal adenopathy- this is highly suspicious for lung cancer at least stage IIIA. While the differential includes granulomatous disease, this has to be considered a lung cancer until proven otherwise. The best option for making a diagnosis and staging in his case is with bronchoscopy and EBUS. This would need to be done under general anesthesia, but is an endoscopic procedure with low risk for major complications. I described the procedure to Mr and Mrs Fraticelli. Mrs Dobbins is particularly worried about him having a bronchoscopy. It would be done under general, but is a brief procedure. Risks also include those associated with GA. They understand the risks would include but not be limited to MI, stroke, bleeding, pneumothorax, as well as other unforeseeable complications.  A biopsy is not needed urgently, but it would greatly simplify matters to go ahead and do it while he is here and before he is therapeutic on coumadin. That would avoid the need to hold coumadin and bridge later on. While not an absolute necessity, it would be preferable to wait until his pneumonia is treated.  DVT- currently on lovenox and starting coumadin. I would favor holding off on coumadin and doing a biopsy later this week to establish a diagnosis, even though it may be a couple of weeks before he is ready to start treatment. Will defer to the primary team on coumadin   CAD- CABG over 20 years ago, no angina. Dr. Servando Snare did his CABG and family would like for him to do the biopsy if possible. I will let Dr. Servando Snare know and see if his schedule could accommodate.   Melrose Nakayama 09/04/2016, 9:32 AM

## 2016-09-04 NOTE — Progress Notes (Signed)
Patient complaint of having difficulty breathing, lung sounds are clear bilaterally and oxygen saturation was 93% on room air, Vitals 124/54 76 20 rr. Xanax 0.25 mg prn given together with oxyir '5mg'$  for c/o generalized pain. Made patient comfortable in bed by propping him with 2 pillows to his back. Rechecked on patient after 15 minutes and he was fast asleep.

## 2016-09-04 NOTE — Progress Notes (Signed)
Occupational Therapy Session Note  Patient Details  Name: RIAZ ONORATO MRN: [id number] Date of Birth: [birth year]  Today's Date: 09/04/2016 OT Individual Time: [phone] and [id number] OT Individual Time Calculation (min): 66 min and 61 min   Short Term Goals: Week 1:  OT Short Term Goal 1 (Week 1): STGs = LTGs  Skilled Therapeutic Interventions/Progress Updates: Pt was lying in bed with spouse present at time of arrival, reported that he wanted to try to have a BM. Supine<sit completed with Min A for slight guiding of L LE with HOB elevated. With education on technique, pt completed slideboard transfer to drop arm commode with overall Min A with therapist stabilizing board and keeping L LE elevated off of floor. Heavy instructional cues provided for safe transition to commode. While on toilet, pt required education on relaxation techniques to void successfully. L LE propped up on footstool for NWB. Pt able to complete lateral leans during hygiene for trunk strengthening. Afterwards he transferred back to bed with extra time. He returned to bed with Mod A. Mod A for rolling R<L for donning clean brief. Mod cues for pt to recall his precautions throughout tx. He was left in bed all needs within reach and bed alarm activated.   2nd Session 1:1 tx (61 minutes) Skilled OT session completed with focus on ADL retraining, trunk strengthening, adherence to precautions, and functional transfers. Pt completed bathing/dressing at EOB with overall Mod A. Dynamic balance maintained with unilateral UE support. Cues provided for ST memory, safety, and sequencing. Therapist completed pericare while pt was standing with RW. Had him wear different colored socks to assist with recalling which foot he is NWB on (blue). Pt complete sit<stand with Mod A and L LE elevated above therapist's foot. Over time, pt began weightbearing on L LE due to endurance deficits. Had him sit down. LB dressing was completed with pt  instructed on lateral leaning technique. With extra time, pt completed 3/4 of this. Therapist held L LE for NWB. Afterwards pt completed slideboard transfer to w/c with Min A. He brushed his hair w/c level at sink. Increased awareness of his precautions during session, pt able to correctly recall 3/3 times when asked. He was left with family and all needs within reach at time of departure.      Therapy Documentation Precautions:  Precautions Precautions: Fall Restrictions Weight Bearing Restrictions: Yes RLE Weight Bearing: Weight bearing as tolerated LLE Weight Bearing: Non weight bearing Other Position/Activity Restrictions: bed to chair only for 8 weeks; WBAT RLE for transfers only General:   Vital Signs: Therapy Vitals Temp: (P) 98 F (36.7 C) Temp Source: (P) Oral Patient Position (if appropriate): (P) Lying Pain: Pt medicated prior to sessions   ADL: ADL ADL Comments: refer to functional navigator     See Function Navigator for Current Functional Status.   Therapy/Group: Individual Therapy  Shubh Chiara A Jadwiga Faidley 09/04/2016, 12:17 PM

## 2016-09-04 NOTE — Progress Notes (Signed)
Physical Therapy Session Note  Patient Details  Name: [name] [name] MRN: [id number] Date of Birth: 05-27-1941  Today's Date: 09/04/2016 PT Individual Time: 1000-1100 PT Individual Time Calculation (min): 60 min   Short Term Goals: Week 1:  PT Short Term Goal 1 (Week 1): = LTGs due to LOS  Skilled Therapeutic Interventions/Progress Updates:  Pt was seen bedside in the am. Pt performed B LEs AAROM 2 sets x 10 reps each. Pt transferred supine to edge of bed with bed rail and mod A with verbal cues. Pt transferred edge of bed to w/c with sliding board to R with mod A and verbal cues. Pt propelled w/c about 25 feet with B UEs and S. Pt transferred w/c to edge of mat, edge of mat to w/c with sliding board and mod A with verbal cues to right. Pt propelled w/c about 70 feet with B Ues and S. Pt returned to room and left sitting up with call bell within reach.   Therapy Documentation Precautions:  Precautions Precautions: Fall Restrictions Weight Bearing Restrictions: Yes RLE Weight Bearing: Weight bearing as tolerated LLE Weight Bearing: Non weight bearing Other Position/Activity Restrictions: bed to chair only for 8 weeks; WBAT RLE for transfers only General:   Vital Signs:  Pain: No c/o pain.   See Function Navigator for Current Functional Status.   Therapy/Group: Individual Therapy  Dub Amis 09/04/2016, 11:31 AM

## 2016-09-04 NOTE — Progress Notes (Signed)
Physical Therapy Session Note  Patient Details  Name: [name] [name] MRN: [id number] Date of Birth: 08/03/1940  Today's Date: 09/04/2016 PT Individual Time: 1632-1702 PT Individual Time Calculation (min): 30 min   Short Term Goals: Week 1:  PT Short Term Goal 1 (Week 1): = LTGs due to LOS  Skilled Therapeutic Interventions/Progress Updates:  Pt received in room with family present but exiting upon PT arrival. Transported pt to rehab gym via w/c total assist for time management. Pt c/o buttocks discomfort; educated pt on need for pressure relief every 30 minutes. After therapist demonstration, pt attempted lateral leans and tricep pushups but pt unable to sufficiently pressure relief. Provided pt with boosting handout & assisted pt with boosting; pt reported comfort with task and decreased buttocks discomfort afterwards. Pt's family not present for boosting and would benefit from education prior to pt's d/c. Boosting handouts attached to pt's bulletin board in room. Pt then propelled w/c with BUE and supervision back to room for cardiopulmonary endurance training. At end of session pt left in room with QRB donned, all needs within reach & set up with meal tray.   Therapy Documentation Precautions:  Precautions Precautions: Fall Restrictions Weight Bearing Restrictions: Yes RLE Weight Bearing: Weight bearing as tolerated LLE Weight Bearing: Non weight bearing Other Position/Activity Restrictions: bed to chair only for 8 weeks; WBAT RLE for transfers only  Pain: c/o burning in kidneys & scrotum - RN made aware.   See Function Navigator for Current Functional Status.   Therapy/Group: Individual Therapy  [name] 09/04/2016, 5:13 PM

## 2016-09-04 NOTE — Progress Notes (Signed)
[name] [name] is a 76 y.o. male 1940/07/16 [id number]  Subjective: No new complaints.SOB at times. C/o constipation. Feeling weak.  Objective: Vital signs in last 24 hours: Temp:  [99 F (37.2 C)] 99 F (37.2 C) (03/18 0506) Pulse Rate:  [72-81] 76 (03/18 0506) Resp:  [18] 18 (03/18 0506) BP: (98-124)/(37-54) 124/54 (03/18 0506) SpO2:  [97 %-98 %] 98 % (03/18 0506) Weight change:  Last BM Date: 09/03/16  Intake/Output from previous day: 03/17 0701 - 03/18 0700 In: 60 [P.O.:60] Out: 2101 [Urine:2100; Stool:1] Last cbgs: CBG (last 3)  No results for input(s): GLUCAP in the last 72 hours.   Physical Exam General: No apparent distress  Appears tired HEENT: not dry Lungs: Normal effort. Lungs w/decr BS B, no wheezes. Cardiovascular: irreg rate and rhythm,  Abdomen: S/NT/ND; BS(+) Musculoskeletal:  unchanged Neurological: No new neurological deficits Wounds: [name]    Skin: clear  Aging changes LEs w/trace edema Mental state: Alert, oriented, cooperative    Lab Results: BMET    Component Value Date/Time   NA 127 (L) 09/04/2016 0537   NA 142 03/26/2016 0801   K 3.7 09/04/2016 0537   CL 96 (L) 09/04/2016 0537   CO2 23 09/04/2016 0537   GLUCOSE 128 (H) 09/04/2016 0537   BUN 25 (H) 09/04/2016 0537   BUN 16 03/26/2016 0801   CREATININE 0.87 09/04/2016 0537   CREATININE 0.98 06/28/2014 0819   CALCIUM 7.7 (L) 09/04/2016 0537   GFRNONAA >60 09/04/2016 0537   GFRAA >60 09/04/2016 0537   CBC    Component Value Date/Time   WBC 26.9 (H) 09/03/2016 0119   RBC 2.75 (L) 09/03/2016 0119   HGB 8.3 (L) 09/03/2016 0119   HCT 24.6 (L) 09/03/2016 0119   PLT 240 09/03/2016 0119   MCV 89.5 09/03/2016 0119   MCH 30.2 09/03/2016 0119   MCHC 33.7 09/03/2016 0119   RDW 16.1 (H) 09/03/2016 0119   LYMPHSABS 1.1 09/01/2016 0427   MONOABS 0.5 09/01/2016 0427   EOSABS 0.1 09/01/2016 0427   BASOSABS 0.0 09/01/2016 0427    Studies/Results: Mr Brain W Wo Contrast  Result Date:  09/02/2016 CLINICAL DATA:  76 y/o  M; SRS protocol for metastatic lung cancer. EXAM: MRI HEAD WITHOUT AND WITH CONTRAST TECHNIQUE: Multiplanar, multiecho pulse sequences of the brain and surrounding structures were obtained without and with intravenous contrast. CONTRAST:  42m MULTIHANCE GADOBENATE DIMEGLUMINE 529 MG/ML IV SOLN COMPARISON:  08/22/2016 CT of the head. FINDINGS: Brain: Left frontal lesion on CT demonstrates gyriform T1 shortening and susceptibility blooming as well as a small region of T2 FLAIR hyperintense signal abnormality in white matter, and no appreciable enhancement after administration of intravenous contrast. No additional focus of abnormal enhancement in the brain is identified. Mild motion degradation of postcontrast sequences. A diffusion weighted imaging there are several foci of diffusion restriction within the right frontal and parietal lobe in a "Border zone" distribution. There is a small subdural collection over the left cerebral convexity with incomplete FLAIR suppression, T1 shortening, and diffusion restriction measuring up to 3 mm in thickness compatible with a subdural hematoma. No significant mass effect on the brain. This is not appreciable on the prior CT of the head and may represent interval hemorrhage. No hydrocephalus. Cavum septum pellucidum. Few punctate foci of susceptibility in the right frontal lobe, right parietal lobe, and right cerebellar hemisphere are compatible with hemosiderin deposition of old microhemorrhage. Vascular: Normal flow voids. Skull and upper cervical spine: Normal marrow signal. Sinuses/Orbits: Negative. Other:  None. IMPRESSION: 1. Left frontal lesion on CT demonstrate gyriform T1 shortening and susceptibility blooming without appreciable enhancement on MRI. Findings favor a subacute to chronic hemorrhagic infarct or cortical contusion. Follow-up MRI is recommended with contrast to exclude underlying hemorrhagic metastasis. 2. No definite  enhancing lesion of the brain identified. Moderate motion degradation of postcontrast sequences. 3. Small foci of diffusion restriction in the right frontal and parietal lobes in "border zone" distribution compatible with acute/early subacute infarction. The pattern suggests hypoxia/hypoperfusion. Sequelae of trauma with shear injury is also possible, although no associate hemorrhage is identified. 4. Small subdural hematoma over the left cerebral convexity without significant mass effect, not appreciable on prior CT, may represent interval hemorrhage. These results will be called to the ordering clinician or representative by the Radiologist Assistant, and communication documented in the PACS or zVision Dashboard. Electronically Signed   By: Kristine Garbe M.D.   On: 09/02/2016 18:13   Dg Chest Port 1 View  Result Date: 09/02/2016 CLINICAL DATA:  Shortness of Breath EXAM: PORTABLE CHEST 1 VIEW COMPARISON:  August 22, 2016 chest radiograph and chest CT FINDINGS: There is patchy airspace opacity in each lower lobe, likely multifocal pneumonia. There is cardiomegaly with pulmonary venous hypertension. No adenopathy. There is aortic atherosclerosis. Patient is status post coronary artery bypass grafting. No evident bone lesions. IMPRESSION: Pulmonary vascular congestion. Probable patchy pneumonia in each lung base. There appears to be slight increased opacity in these areas compared to recent chest radiograph. Cardiac silhouette is stable. There is aortic atherosclerosis. Electronically Signed   By: Lowella Grip III M.D.   On: 09/02/2016 11:38    Medications: I have reviewed the patient's current medications.  Assessment/Plan:   1. Closed fx of pelvis - cont w/CIR 2. L SI joint ORIF - CIR 3. DVT: Lovenox and coumadin 4. CAD 5. A fib - Coumadin 6. B pneumonia: vanc and rocephin 7. RML mass: Spiculated mass in the right middle lobe consistent with carcinoma the lung with metastatic  adenopathy in the subcarinal region. 8. Low BP - better. D/c IVF 9. Hyponatremia w/volume overload: IV Lasix 10. Constipation: start Linzess po 11. L subdural - ASA held 12. Low albumen - poor appetite   Discussed w/wife and the pt  Length of stay, days: 4  Walker Kehr , MD 09/04/2016, 8:05 AM

## 2016-09-05 ENCOUNTER — Telehealth: Payer: Self-pay | Admitting: *Deleted

## 2016-09-05 ENCOUNTER — Inpatient Hospital Stay (HOSPITAL_COMMUNITY): Payer: Medicare Other | Admitting: Physical Therapy

## 2016-09-05 ENCOUNTER — Inpatient Hospital Stay (HOSPITAL_COMMUNITY): Payer: Medicare Other | Admitting: Speech Pathology

## 2016-09-05 ENCOUNTER — Inpatient Hospital Stay (HOSPITAL_COMMUNITY): Payer: Medicare Other | Admitting: Occupational Therapy

## 2016-09-05 ENCOUNTER — Inpatient Hospital Stay (HOSPITAL_COMMUNITY): Payer: Medicare Other

## 2016-09-05 DIAGNOSIS — N390 Urinary tract infection, site not specified: Secondary | ICD-10-CM

## 2016-09-05 DIAGNOSIS — J90 Pleural effusion, not elsewhere classified: Secondary | ICD-10-CM

## 2016-09-05 DIAGNOSIS — R410 Disorientation, unspecified: Secondary | ICD-10-CM

## 2016-09-05 DIAGNOSIS — K5901 Slow transit constipation: Secondary | ICD-10-CM

## 2016-09-05 LAB — CBC
HCT: 28.2 % — ABNORMAL LOW (ref 39.0–52.0)
HEMOGLOBIN: 9.3 g/dL — AB (ref 13.0–17.0)
MCH: 29.3 pg (ref 26.0–34.0)
MCHC: 33 g/dL (ref 30.0–36.0)
MCV: 89 fL (ref 78.0–100.0)
Platelets: 307 10*3/uL (ref 150–400)
RBC: 3.17 MIL/uL — AB (ref 4.22–5.81)
RDW: 15.8 % — ABNORMAL HIGH (ref 11.5–15.5)
WBC: 8.9 10*3/uL (ref 4.0–10.5)

## 2016-09-05 LAB — BASIC METABOLIC PANEL
ANION GAP: 11 (ref 5–15)
BUN: 22 mg/dL — ABNORMAL HIGH (ref 6–20)
CALCIUM: 8.2 mg/dL — AB (ref 8.9–10.3)
CHLORIDE: 93 mmol/L — AB (ref 101–111)
CO2: 26 mmol/L (ref 22–32)
Creatinine, Ser: 0.84 mg/dL (ref 0.61–1.24)
Glucose, Bld: 91 mg/dL (ref 65–99)
POTASSIUM: 3.4 mmol/L — AB (ref 3.5–5.1)
Sodium: 130 mmol/L — ABNORMAL LOW (ref 135–145)

## 2016-09-05 LAB — CBC WITH DIFFERENTIAL/PLATELET
BASOS ABS: 0 10*3/uL (ref 0.0–0.1)
Basophils Relative: 0 %
EOS ABS: 0.2 10*3/uL (ref 0.0–0.7)
EOS PCT: 2 %
HCT: 28.8 % — ABNORMAL LOW (ref 39.0–52.0)
Hemoglobin: 9.5 g/dL — ABNORMAL LOW (ref 13.0–17.0)
Lymphocytes Relative: 6 %
Lymphs Abs: 0.6 10*3/uL — ABNORMAL LOW (ref 0.7–4.0)
MCH: 29.1 pg (ref 26.0–34.0)
MCHC: 33 g/dL (ref 30.0–36.0)
MCV: 88.3 fL (ref 78.0–100.0)
MONO ABS: 0.5 10*3/uL (ref 0.1–1.0)
Monocytes Relative: 5 %
Neutro Abs: 7.9 10*3/uL — ABNORMAL HIGH (ref 1.7–7.7)
Neutrophils Relative %: 87 %
PLATELETS: 341 10*3/uL (ref 150–400)
RBC: 3.26 MIL/uL — AB (ref 4.22–5.81)
RDW: 15.7 % — AB (ref 11.5–15.5)
WBC: 9.1 10*3/uL (ref 4.0–10.5)

## 2016-09-05 LAB — VANCOMYCIN, TROUGH: Vancomycin Tr: 17 ug/mL (ref 15–20)

## 2016-09-05 LAB — PROTIME-INR
INR: 1.68
Prothrombin Time: 20 seconds — ABNORMAL HIGH (ref 11.4–15.2)

## 2016-09-05 MED ORDER — METOPROLOL TARTRATE 50 MG PO TABS
50.0000 mg | ORAL_TABLET | Freq: Two times a day (BID) | ORAL | Status: DC
  Administered 2016-09-05 – 2016-09-06 (×2): 50 mg via ORAL
  Filled 2016-09-05 (×2): qty 1

## 2016-09-05 MED ORDER — POLYETHYLENE GLYCOL 3350 17 G PO PACK
17.0000 g | PACK | Freq: Two times a day (BID) | ORAL | Status: DC
  Administered 2016-09-05 – 2016-09-15 (×13): 17 g via ORAL
  Filled 2016-09-05 (×20): qty 1

## 2016-09-05 MED ORDER — WARFARIN SODIUM 7.5 MG PO TABS: 7.5000 mg | ORAL_TABLET | Freq: Once | ORAL | Status: DC

## 2016-09-05 MED ORDER — SENNOSIDES-DOCUSATE SODIUM 8.6-50 MG PO TABS
2.0000 | ORAL_TABLET | Freq: Two times a day (BID) | ORAL | Status: DC
  Administered 2016-09-05 – 2016-09-16 (×16): 2 via ORAL
  Filled 2016-09-05 (×22): qty 2

## 2016-09-05 MED ORDER — POTASSIUM CHLORIDE CRYS ER 20 MEQ PO TBCR
20.0000 meq | EXTENDED_RELEASE_TABLET | Freq: Every day | ORAL | Status: DC
  Administered 2016-09-05 – 2016-09-16 (×12): 20 meq via ORAL
  Filled 2016-09-05 (×12): qty 1

## 2016-09-05 NOTE — Progress Notes (Signed)
Physical Therapy Note  Patient Details  Name: [name] [name] MRN: [id number] Date of Birth: June 27, 1940 Today's Date: 09/05/2016  [id number], 45 min individual tx Pain:5/10, premedicated WBAT RLE; NWB LLE  Pt donned pants in flat bed with assistance to thread them on bil feet and pull up to hips (thin, snug pants).  Supine> sit EOB very slowly, with HOB raised. Pt balanced sitting EOB x 5 minutes without assistance.  Squat pivot to R bed> w/c.  w/c propulsion on level ground with supervision, and up/down 3% grade with min assist. Pt's affect brightened  up after leaving the unit, going to hallway to [address].  Pt had difficulty learning new information regarding most efficient techniques for propelling w/c.  When asked how many children he had, he stated 4, but gave the names of only 3, several times.  He needed extra time to remember the 4th child's name.  Pt left resting in w/c with quick release belt applied and all needs within reach.  See function navigator.  Yehia Mcbain 09/05/2016, 7:48 AM

## 2016-09-05 NOTE — Telephone Encounter (Signed)
Oncology Nurse Navigator Documentation  Oncology Nurse Navigator Flowsheets 09/05/2016  Navigator Encounter Type Telephone/I received a message from pharmacist [place] regarding Ms. Fincher. Apparently, his wife called and was upset.  It looks like her husband has cancer but no tissue DX.  A thoracic surgeon spoke with her and she called the cancer center.  I called to figure out what questions she had.  I was unable to reach or leave a vm message.   Telephone Outgoing Call  Barriers/Navigation Needs Education  Acuity Level 2  Time Spent with Patient 30

## 2016-09-05 NOTE — Progress Notes (Signed)
ANTICOAGULATION CONSULT NOTE - Follow Up Consult  Pharmacy Consult for coumadin / lovenox  Indication: DVT  Allergies  Allergen Reactions  . Imitrex [Sumatriptan] Nausea And Vomiting and Other (See Comments)    "made me like I was having a stoke"  . Lipitor [Atorvastatin] Nausea And Vomiting and Other (See Comments)    INTOLERANCE > MYALGIAS "couldn't get out of the bed ( pt had to crawl out of bed ) I hurt Klye Besecker bad"    Patient Measurements: Height: '5\' 6"'$  (167.6 cm) Weight: 185 lb 6.4 oz (84.1 kg) IBW/kg (Calculated) : 63.8 Heparin Dosing Weight:   Vital Signs: Temp: 97.9 F (36.6 C) (03/19 0524) Temp Source: Oral (03/19 0524) BP: 119/47 (03/19 0524) Pulse Rate: 66 (03/19 0524)  Labs:  Recent Labs  09/03/16 0119 09/04/16 0537 09/05/16 0552  HGB 8.3*  --  9.3*  HCT 24.6*  --  28.2*  PLT 240  --  307  LABPROT 16.1* 17.7* 20.0*  INR 1.28 1.45 1.68  CREATININE 1.15 0.87 0.84    Estimated Creatinine Clearance: 73.7 mL/min (by C-G formula based on SCr of 0.84 mg/dL).   Medications:  Scheduled:  . cefTRIAXone (ROCEPHIN)  IV  1 g Intravenous Q24H  . docusate sodium  100 mg Oral BID  . enoxaparin (LOVENOX) injection  1 mg/kg Subcutaneous Q12H  . feeding supplement (PRO-STAT SUGAR FREE 64)  30 mL Oral BID  . levothyroxine  112 mcg Oral QAC breakfast  . linaclotide  145 mcg Oral QAC breakfast  . metoprolol  75 mg Oral BID  . pantoprazole  40 mg Oral Daily  . rosuvastatin  20 mg Oral q1800  . senna  1 tablet Oral Daily  . tamsulosin  0.4 mg Oral Daily  . triamterene-hydrochlorothiazide  1 tablet Oral Daily  . vancomycin  750 mg Intravenous Q12H  . Warfarin - Pharmacist Dosing Inpatient   Does not apply q1800   Infusions:    Assessment: 76 yo male with DVT is currently on subtherapeutic coumadin bridging with treatment dose of lovenox.  INR today is up to 1.68.  CBC stable.   Goal of Therapy:  Anti-Xa level 0.6-1 units/ml 4hrs after LMWH dose given; INR  2-3 Monitor platelets by anticoagulation protocol: Yes   Plan:  -Continue Lovenox 85 mg SCq12h -Warfarin 7.5 mg PO x 1  -Daily PT/INR -Monitor CBC, worsening hematoma  Faryal Marxen, Tsz-Yin 09/05/2016,8:04 AM

## 2016-09-05 NOTE — Progress Notes (Signed)
Patient ID: [name] [name], male   DOB: 03-Jun-1941, 76 y.o.   MRN: [id number]      [address]       [place] [address]             [phone]                     LOS: 5 days   Subjective: Patient seen Friday by Dr Roxan Hockey for consideration of bronchoscopy and EBUS to obtain tissue diagnosis from mediastinal adenopathy. When the patient originally fell from the ladder CT of the head suggested metastatic disease. Subsequent MRI shows this to be less likely.   Objective: Vital signs in last 24 hours: Patient Vitals for the past 24 hrs:  BP Temp Temp src Pulse Resp SpO2  09/05/16 1548 (!) 108/42 98.3 F (36.8 C) Oral 68 18 99 %  09/05/16 0819 (!) 118/44 - - 67 - -  09/05/16 0524 (!) 119/47 97.9 F (36.6 C) Oral 66 18 100 %  09/04/16 2300 (!) 120/47 - - 62 - -    Filed Weights   08/31/16 1749  Weight: 185 lb 6.4 oz (84.1 kg)    Hemodynamic parameters for last 24 hours:    Intake/Output from previous day: 03/18 0701 - 03/19 0700 In: 140 [P.O.:140] Out: 6600 [Urine:6600] Intake/Output this shift: Total I/O In: 540 [P.O.:540] Out: 1300 [Urine:1300]  Scheduled Meds: . cefTRIAXone (ROCEPHIN)  IV  1 g Intravenous Q24H  . enoxaparin (LOVENOX) injection  1 mg/kg Subcutaneous Q12H  . feeding supplement (PRO-STAT SUGAR FREE 64)  30 mL Oral BID  . levothyroxine  112 mcg Oral QAC breakfast  . linaclotide  145 mcg Oral QAC breakfast  . metoprolol  50 mg Oral BID  . pantoprazole  40 mg Oral Daily  . polyethylene glycol  17 g Oral BID  . potassium chloride  20 mEq Oral Daily  . rosuvastatin  20 mg Oral q1800  . senna-docusate  2 tablet Oral BID  . tamsulosin  0.4 mg Oral Daily  . triamterene-hydrochlorothiazide  1 tablet Oral Daily  . vancomycin  750 mg Intravenous Q12H   Continuous Infusions: PRN Meds:.acetaminophen, ALPRAZolam, bisacodyl, nitroGLYCERIN, ondansetron **OR** ondansetron (ZOFRAN) IV, oxyCODONE, oxyCODONE-acetaminophen, sorbitol,  traZODone  General appearance: alert, cooperative and appears older than stated age Neurologic: intact Heart: regular rate and rhythm, S1, S2 normal, no murmur, click, rub or gallop Lungs: diminished breath sounds bibasilar Abdomen: soft, non-tender; bowel sounds normal; no masses,  no organomegaly Extremities: Significant bruising along the flanks and hip  Lab Results: CBC: Recent Labs  09/05/16 0552 09/05/16 1013  WBC 8.9 9.1  HGB 9.3* 9.5*  HCT 28.2* 28.8*  PLT 307 341   BMET:  Recent Labs  09/04/16 0537 09/05/16 0552  NA 127* 130*  K 3.7 3.4*  CL 96* 93*  CO2 23 26  GLUCOSE 128* 91  BUN 25* 22*  CREATININE 0.87 0.84  CALCIUM 7.7* 8.2*    PT/INR:  Recent Labs  09/05/16 0552  LABPROT 20.0*  INR 1.68     Radiology    Ct Cervical Spine Wo Contrast  Result Date: 08/22/2016 CLINICAL DATA:  76 year old male status post fall from ladder. Extensive pelvic fractures. Abnormal chest CT today revealing a spiculated right middle lobe lung mass. Initial encounter. EXAM: CT HEAD WITHOUT CONTRAST CT CERVICAL SPINE WITHOUT CONTRAST TECHNIQUE: Multidetector CT imaging of the head and cervical spine was performed following the standard protocol without intravenous contrast. Multiplanar  CT image reconstructions of the cervical spine were also generated. COMPARISON:  CT chest abdomen and pelvis from today reported separately. [facility] Brain MRI 07/13/2004. FINDINGS: CT HEAD FINDINGS Brain: Rounded mixed hyper and hypodense somewhat masslike area in the anterior left frontal lobe estimated at 2 cm. Adjacent increased white matter hypodensity in a configuration suggestive of mild vasogenic edema. See series 8, image 43. No significant regional mass effect. No second similar lesion identified elsewhere although there is a punctate perhaps dystrophic calcification in the right frontal lobe at the gray-white matter junction (series 4, image 26). No superimposed intracranial  hemorrhage or mass effect. Basilar cisterns are patent. Cavum septum pellucidum variant. No ventriculomegaly. Hypodensity in the right caudate nucleus compatible with chronic small vessel disease. No acute cortically based infarct identified. Vascular: Calcified atherosclerosis at the skull base. No suspicious intracranial vascular hyperdensity. Skull: No skull fracture identified. Bone mineralization appears within normal limits for age. Sinuses/Orbits: Mild to moderate anterior ethmoid and frontoethmoidal recess mucosal thickening. Other paranasal sinuses are clear. The left tympanic cavity and mastoids are clear. There is sclerosis of the right mastoids. There is asymmetric thickening of the right tympanic membrane. Other: Visualized orbit soft tissues are within normal limits. Mild left posterosuperior convexity scalp contusion or hematoma (series 6, image 76) with intact underlying calvarium. No other scout soft tissue injury identified. CT CERVICAL SPINE FINDINGS Alignment: Preserved cervical lordosis. Cervicothoracic junction alignment is within normal limits. There is trace anterolisthesis of C3 on C4 and C4 on C5 with associated chronic facet arthropathy. Bilateral posterior element alignment is within normal limits. Skull base and vertebrae: Visualized skull base is intact. No atlanto-occipital dissociation. No cervical spine fracture identified. There is heterogeneous bone mineralization in the cervical spine, including small lucent lesions in the right C4 (coronal image 31) and left C6 (coronal image 36) levels. Soft tissues and spinal canal: No prevertebral fluid or swelling. No visible canal hematoma. Ligamentous hypertrophy about the odontoid. Calcified carotid and vertebral artery atherosclerosis. Evidence of previous bilateral carotid endarterectomy. Extensive calcified proximal great vessel atherosclerosis at the visible thoracic inlet. Disc levels: Moderate to severe chronic facet degeneration  greater on the right from the C3 to the C6 level. Disc bulging. Multilevel ligament flavum hypertrophy suspected. Up to mild degenerative cervical spinal stenosis at C3-C4. Upper chest: Visible upper thoracic levels appear intact. Chest findings today are reported separately. IMPRESSION: 1. Abnormal anterior left frontal lobe, but the appearance does not resemble typical traumatic injury to the brain and is instead suspicious in this clinical setting for a 2 cm brain mass with mild surrounding vasogenic edema. Brain MRI without and with contrast when possible would characterize further. 2. No other acute intracranial abnormality is evident. Mild chronic small vessel disease in the right basal ganglia. 3.  No acute fracture or listhesis identified in the cervical spine. 4. Heterogeneous bone mineralization in the cervical spine could be degenerative in nature but early osseous metastatic disease is difficult to exclude. The head CT findings were reviewed in person with Dr. Doreen Salvage on 08/22/2016 at 1557 hours. Electronically Signed   By: Genevie Ann M.D.   On: 08/22/2016 16:23    Ct Abdomen Pelvis  And chest Result Date: 08/22/2016 CLINICAL DATA:  Multiple trauma. The patient fell 12 feet from a ladder. EXAM: CT CHEST, ABDOMEN, AND PELVIS WITH CONTRAST TECHNIQUE: Multidetector CT imaging of the chest, abdomen and pelvis was performed following the standard protocol during bolus administration of intravenous contrast. CONTRAST:  100 cc  Omnipaque 300 COMPARISON:  Pelvic radiograph dated 08/22/2016 and chest x-ray dated 08/22/2016 FINDINGS: CT CHEST FINDINGS Cardiovascular: Extensive aortic atherosclerosis. Extensive coronary artery calcifications. Prior CABG. Heart size is normal. Mediastinum/Nodes: 3.7 x 2.3 cm enlarged subcarinal lymph node. Thyroid gland, trachea, and esophagus demonstrate no significant findings. Lungs/Pleura: There is a spiculated 2.0 x 2.8 cm mass in the right middle lobe with soft tissue stranding  to the periphery, consistent with a primary carcinoma the lung. There is a vague 7 mm area of abnormal density in the posterior aspect of the left upper lobe on image 47 of series 3. There are 2 small nodular densities in the right upper lobe on image number 31 of series 3, 1 is 4 mm and the other is 2 mm. Musculoskeletal: No fractures or other acute abnormalities. CT ABDOMEN PELVIS FINDINGS Hepatobiliary: No focal liver abnormality is seen. Status post cholecystectomy. No biliary dilatation. Pancreas: Unremarkable. No pancreatic ductal dilatation or surrounding inflammatory changes. Spleen: No splenic injury or perisplenic hematoma. Adrenals/Urinary Tract: Adrenal glands are unremarkable. Kidneys are normal, without renal calculi, focal lesion, or hydronephrosis. There is retroperitoneal hemorrhage around the left side of the bladder. Tear is factor Maxwell findings no hemorrhage contrecoup never mind anterior to a diameter question MR plaques are Stomach/Bowel: Stomach is within normal limits. Appendix appears normal. No evidence of bowel wall thickening, distention, or inflammatory changes. Vascular/Lymphatic: There is retroperitoneal hemorrhage in the pelvis particularly adjacent to the left common iliac artery. I suspect the bleeding is from the left iliac vein. There is blood in the retroperitoneum anterior to the prostate gland and bladder. No appreciable free intraperitoneal hemorrhage. Hemorrhagic contusion in the left buttock. Reproductive: Slight enlargement of the prostate gland. Other: There is hemorrhage along the left iliopsoas muscle. Musculoskeletal: There is diastases of the symphysis pubis and of the left sacroiliac joint. There are fractures of the left transverse processes of L2 through S1. IMPRESSION: 1. Disruption of the symphysis pubis and left sacroiliac joint with retroperitoneal hemorrhage in the left side of the pelvis and subcutaneous hemorrhage in the left buttock. I suspect there is an  injury to the left external iliac vein. 2. Spiculated mass in the right middle lobe consistent with carcinoma the lung with metastatic adenopathy in the subcarinal region. 3. Several small pulmonary nodules which are too small to characterize but could represent metastases. 4. Fractures of the left transverse processes of L2 through S1. Electronically Signed   By: Lorriane Shire M.D.   On: 08/22/2016 16:19    Mr Jeri Cos ER Contrast  Result Date: 09/02/2016 CLINICAL DATA:  76 y/o  M; SRS protocol for metastatic lung cancer. EXAM: MRI HEAD WITHOUT AND WITH CONTRAST TECHNIQUE: Multiplanar, multiecho pulse sequences of the brain and surrounding structures were obtained without and with intravenous contrast. CONTRAST:  30m MULTIHANCE GADOBENATE DIMEGLUMINE 529 MG/ML IV SOLN COMPARISON:  08/22/2016 CT of the head. FINDINGS: Brain: Left frontal lesion on CT demonstrates gyriform T1 shortening and susceptibility blooming as well as a small region of T2 FLAIR hyperintense signal abnormality in white matter, and no appreciable enhancement after administration of intravenous contrast. No additional focus of abnormal enhancement in the brain is identified. Mild motion degradation of postcontrast sequences. A diffusion weighted imaging there are several foci of diffusion restriction within the right frontal and parietal lobe in a "Border zone" distribution. There is a small subdural collection over the left cerebral convexity with incomplete FLAIR suppression, T1 shortening, and diffusion restriction measuring up to 3 mm  in thickness compatible with a subdural hematoma. No significant mass effect on the brain. This is not appreciable on the prior CT of the head and may represent interval hemorrhage. No hydrocephalus. Cavum septum pellucidum. Few punctate foci of susceptibility in the right frontal lobe, right parietal lobe, and right cerebellar hemisphere are compatible with hemosiderin deposition of old microhemorrhage.  Vascular: Normal flow voids. Skull and upper cervical spine: Normal marrow signal. Sinuses/Orbits: Negative. Other: None. IMPRESSION: 1. Left frontal lesion on CT demonstrate gyriform T1 shortening and susceptibility blooming without appreciable enhancement on MRI. Findings favor a subacute to chronic hemorrhagic infarct or cortical contusion. Follow-up MRI is recommended with contrast to exclude underlying hemorrhagic metastasis. 2. No definite enhancing lesion of the brain identified. Moderate motion degradation of postcontrast sequences. 3. Small foci of diffusion restriction in the right frontal and parietal lobes in "border zone" distribution compatible with acute/early subacute infarction. The pattern suggests hypoxia/hypoperfusion. Sequelae of trauma with shear injury is also possible, although no associate hemorrhage is identified. 4. Small subdural hematoma over the left cerebral convexity without significant mass effect, not appreciable on prior CT, may represent interval hemorrhage. These results will be called to the ordering clinician or representative by the Radiologist Assistant, and communication documented in the PACS or zVision Dashboard. Electronically Signed   By: Kristine Garbe M.D.   On: 09/02/2016 18:13   Dg Chest Port 1 View  Result Date: 09/02/2016 CLINICAL DATA:  Shortness of Breath EXAM: PORTABLE CHEST 1 VIEW COMPARISON:  August 22, 2016 chest radiograph and chest CT FINDINGS: There is patchy airspace opacity in each lower lobe, likely multifocal pneumonia. There is cardiomegaly with pulmonary venous hypertension. No adenopathy. There is aortic atherosclerosis. Patient is status post coronary artery bypass grafting. No evident bone lesions. IMPRESSION: Pulmonary vascular congestion. Probable patchy pneumonia in each lung base. There appears to be slight increased opacity in these areas compared to recent chest radiograph. Cardiac silhouette is stable. There is aortic  atherosclerosis. Electronically Signed   By: Lowella Grip III M.D.   On: 09/02/2016 11:38    Assessment/Plan: Status post recent traumatic injury from falling from a ladder, CT scan does suggest lung cancer with mediastinal involvement. The MRI of the brain is less suggestive of brain metastasis than originally thought on the CT of the head. I discussed with the patient his wife and son bronchoscopy and ebus sometime before his discharge from the hospital. With this pending will need to keep him off of Coumadin.   Grace Isaac MD 09/05/2016 5:32 PM

## 2016-09-05 NOTE — Progress Notes (Signed)
Speech Language Pathology Daily Session Note  Patient Details  Name: [name] [name] MRN: [id number] Date of Birth: [birth year]  Today's Date: 09/05/2016 SLP Individual Time: 2336-1224 SLP Individual Time Calculation (min): 15 min  Short Term Goals: Week 1: SLP Short Term Goal 1 (Week 1): Pt will utilize external memory aids for recall of new infomraiton with Min A verbal cues.  SLP Short Term Goal 2 (Week 1): Pt will demonstrate intellectual awareness by stating 2 physical and 2 cognitive deficits related to acute hospital wiht Min A verbal cues.  SLP Short Term Goal 3 (Week 1): Pt will recall safety/hip precuations related to surgery with Min A verbal cues.  SLP Short Term Goal 4 (Week 1): Pt will solve basic familiar problems  such as money and medication management with Min A verbal cues.  SLP Short Term Goal 5 (Week 1): Pt will sustain attention to basic familiar tasks for ~15 minutes with Min A verbal cues.   Skilled Therapeutic Interventions:   Skilled treatment session focused on cognition goals. SLP facilitated session by providing information on therapy activities and goals. Pt's son present and encouraging. Pt recalled panic attack on Friday during ST session and voiced that he was fearful panic attack would reoccur with another ST session. Max education provided to pt that ST services didn't cause panic attack. Will attempt at another session. Pt left in son's care. Continue per current plan of care.   Function:    Cognition Comprehension Comprehension assist level: Follows complex conversation/direction with extra time/assistive device  Expression   Expression assist level: Expresses complex ideas: With extra time/assistive device  Social Interaction Social Interaction assist level: Interacts appropriately 75 - 89% of the time - Needs redirection for appropriate language or to initiate interaction.  Problem Solving Problem solving assist level: Solves basic 25 - 49% of the time  - needs direction more than half the time to initiate, plan or complete simple activities  Memory Memory assist level: Recognizes or recalls 25 - 49% of the time/requires cueing 50 - 75% of the time    Pain Pain Assessment Pain Assessment: 0-10 Pain Score: 5  Pain Type: Acute pain Pain Location: Bladder Pain Orientation: Lower Pain Descriptors / Indicators: Tightness Pain Onset: On-going Pain Intervention(s): Repositioned  Therapy/Group: Individual Therapy   Amelya Mabry B. Rutherford Nail, M.S., [place] 09/05/2016, 4:02 PM

## 2016-09-05 NOTE — Progress Notes (Signed)
PHYSICAL MEDICINE & REHABILITATION     PROGRESS NOTE  Subjective/Complaints:  Pt seen laying in bed this AM.  He slept fair overnight.  Son and wife at bedside.  Per nursing pt with burning pain around penis.  Pt complains of constipation as well.  Weekend notes reviewed.   ROS: Denies CP, SOB, N/V/D.  Objective: Vital Signs: Blood pressure (!) 118/44, pulse 67, temperature 97.9 F (36.6 C), temperature source Oral, resp. rate 18, height '5\' 6"'$  (1.676 m), weight 84.1 kg (185 lb 6.4 oz), SpO2 100 %. No results found.  Recent Labs  09/03/16 0119 09/05/16 0552  WBC 26.9* 8.9  HGB 8.3* 9.3*  HCT 24.6* 28.2*  PLT 240 307    Recent Labs  09/04/16 0537 09/05/16 0552  NA 127* 130*  K 3.7 3.4*  CL 96* 93*  GLUCOSE 128* 91  BUN 25* 22*  CREATININE 0.87 0.84  CALCIUM 7.7* 8.2*   CBG (last 3)  No results for input(s): GLUCAP in the last 72 hours.  Wt Readings from Last 3 Encounters:  08/31/16 84.1 kg (185 lb 6.4 oz)  08/22/16 71.7 kg (158 lb)  08/18/16 71.8 kg (158 lb 6 oz)    Physical Exam:  BP (!) 118/44   Pulse 67   Temp 97.9 F (36.6 C) (Oral)   Resp 18   Ht '5\' 6"'$  (1.676 m)   Wt 84.1 kg (185 lb 6.4 oz)   SpO2 100%   BMI 29.92 kg/m  Constitutional: He appears well-developed and well-nourished.  HENT: Normocephalic and atraumatic.  Eyes: EOMI. No discharge.  Cardiovascular: RRR. No JVD. +murmur. Respiratory: Effort normal. Clear GI: Soft. Bowel sounds are normal.  Musculoskeletal: He exhibits edema and tenderness.  GU: Scrotal edema, improving.  Neurological: He is alert.  HOH A&Ox2 with cues and increased time, confused Motor: RUE 4+/5 proximal to distal LUE: 4/5 proximal to distal RLE: 3-/5 proximal to distal LLE: 3/5 HF, 3+/5 KE, 4-/5 ADF/PF  Skin: Skin is warm and dry. Dressing to ORIF c/d/i  Psychiatric: His mood appears anxious. He is slowed.   Assessment/Plan: 1. Functional deficits secondary to closed pelvic ring fracture with  retroperitoneal hemorrhage in the left pelvis s/p ORIF of SI after fall which require 3+ hours per day of interdisciplinary therapy in a comprehensive inpatient rehab setting. Physiatrist is providing close team supervision and 24 hour management of active medical problems listed below. Physiatrist and rehab team continue to assess barriers to discharge/monitor patient progress toward functional and medical goals.  Function:  Bathing Bathing position   Position: Sitting EOB  Bathing parts Body parts bathed by patient: Right arm, Left arm, Chest, Abdomen, Front perineal area, Right upper leg, Left upper leg Body parts bathed by helper: Buttocks, Right lower leg, Left lower leg, Back  Bathing assist        Upper Body Dressing/Undressing Upper body dressing   What is the patient wearing?: Pull over shirt/dress     Pull over shirt/dress - Perfomed by patient: Thread/unthread right sleeve, Thread/unthread left sleeve, Put head through opening, Pull shirt over trunk          Upper body assist Assist Level: Supervision or verbal cues      Lower Body Dressing/Undressing Lower body dressing   What is the patient wearing?: Pants, Non-skid slipper socks       Pants- Performed by helper: Thread/unthread right pants leg, Thread/unthread left pants leg, Pull pants up/down   Non-skid slipper socks- Performed by helper: Don/doff  right sock, Don/doff left sock                  Lower body assist Assist for lower body dressing:  (Max-Total A)      Toileting Toileting     Toileting steps completed by helper: Performs perineal hygiene, Adjust clothing after toileting, Adjust clothing prior to toileting    Toileting assist Assist level: Touching or steadying assistance (Pt.75%)   Transfers Chair/bed transfer   Chair/bed transfer method: Lateral scoot Chair/bed transfer assist level: Touching or steadying assistance (Pt > 75%) Chair/bed transfer assistive device: Sliding board      Locomotion Ambulation Ambulation activity did not occur: Safety/medical concerns (orders for transfers only)         Wheelchair   Type: Manual Max wheelchair distance: 150 ft Assist Level: Supervision or verbal cues  Cognition Comprehension Comprehension assist level: Follows complex conversation/direction with extra time/assistive device  Expression Expression assist level: Expresses complex ideas: With extra time/assistive device  Social Interaction Social Interaction assist level: Interacts appropriately 75 - 89% of the time - Needs redirection for appropriate language or to initiate interaction.  Problem Solving Problem solving assist level: Solves basic 25 - 49% of the time - needs direction more than half the time to initiate, plan or complete simple activities  Memory Memory assist level: Recognizes or recalls 25 - 49% of the time/requires cueing 50 - 75% of the time    Medical Problem List and Plan: 1.  Decreased functional mobility secondary to Closed pelvic ring fracture with disorder of left sacroiliac joint with retroperitoneal hemorrhage in the left side of the pelvis with subcutaneous hemorrhage in the left buttocks after fall status post ORIF with sacroiliac screw fixation of left SI joint 08/25/2016. Weightbearing as tolerated right lower extremity for transfers only nonweightbearing left lower extremity  Cont CIR 2.  Acute DVT:   Subcutaneous Lovenox and coumadin initiated 3/14.   Vascular study with b/l peroneal and tibial veins  Monitor for any bleeding episodes 3. Pain Management: Oxycodone and Percocet as needed 4. Mood: Provide emotional support 5. Neuropsych: This patient is not capable of making decisions on his own behalf. 6. Skin/Wound Care: Routine skin checks 7. Fluids/Electrolytes/Nutrition: Routine I&Os 8. Acute blood loss anemia.   Hb 9.3 on 3/19  Cont to monitor 9. Hypertension.   Lopressor decreased to 50 mg twice a day on 3/19  Maxzide 37.5.-25  milligrams daily 10. CAD status post CABG. No chest pain or shortness of breath. Plavix resumed.   Now on ASA and Coumadin, will discuss holding coumadin if bx planned 11. Atrial fibrillation. Cardiac rate controlled. Continue Lopressor. 12. PVD with multiple revascularization procedures. Continue Plavix. 13. Right middle lobe lung/brain mass.    CTS consulted, consider bx this week.  Will speak to family 50. Scrotal edema  Will cont foley due to edema and concurrent medical issues  Continue Flomax 15. Hypothyroidism. Synthroid 16. Hyperlipidemia. Crestor 17. Constipation. Laxative assistance 18. Hyponatremia  Na+ 130 on 3/19  Cont to monitor 19. Hypokalemia  K+ 3.4 on 3/19  Supplementing  Cont to monitor 20. Hypoalbuminemia  Supplement initiated 3/15 21. Sleep disturbance  Trazodone PRN started 3/15 22. Leukocytosis: Resolved  WBCs 8.9 on 3/19 23. Confusion  Multifactorial ?UTI +/- meds, +/- sleep +/- TBI 24. UTI  Cont abx 25. HCAP  Cont Vanc 26. MRI abnormality  Reviewed from 3/16, showing areas of early infarct, ?hemorrhage source 27. Constipation    Will increase meds  > 35  minutes spent with patient, wife and son with >25 minutes in educating and counseling regarding MRI brain, lung mass, plans for biopsy, etc.  LOS (Days) 5 A FACE TO FACE EVALUATION WAS PERFORMED  Ankit Lorie Phenix 09/05/2016 8:22 AM

## 2016-09-05 NOTE — Progress Notes (Signed)
Occupational Therapy Session Note  Patient Details  Name: [name] [name] MRN: [id number] Date of Birth: [birth year]  Today's Date: 09/05/2016 OT Individual Time: 1103-1200 OT Individual Time Calculation (min): 57 min    Short Term Goals: Week 1:  OT Short Term Goal 1 (Week 1): STGs = LTGs  Skilled Therapeutic Interventions/Progress Updates:    OT treatment session focused on activity tolerance and transfer training. Grooming tasks completed at the sink with focus on UB strength/activity tolerance to wash hair using shampoo cap. 1 rest break for UEs required 2/2 fatigue. RN paused IV to thread shirt with min A. Transfer training using slide board w/ overall min/mod A to L and R to wide drop arm BSC. Manual facilitation for weight shifting to scoot while maintaining NWB LLE. Pt propelled wc back to room with supervision and min verbal cues for technique to turn wc into room. Lunch tray set-up, call bell in reach, and safety belt on.   Therapy Documentation Precautions:  Precautions Precautions: Fall Restrictions Weight Bearing Restrictions: Yes RLE Weight Bearing: Weight bearing as tolerated LLE Weight Bearing: Non weight bearing Other Position/Activity Restrictions: bed to chair only for 8 weeks; WBAT RLE for transfers only Pain: Pain Assessment Pain Assessment: 0-10 Pain Score: 5  Pain Type: Acute pain Pain Location: Bladder Pain Orientation: Lower Pain Descriptors / Indicators: Tightness Pain Onset: On-going Pain Intervention(s): Repositioned ADL: ADL ADL Comments: refer to functional navigator  See Function Navigator for Current Functional Status.   Therapy/Group: Individual Therapy  [name] 09/05/2016, 12:32 PM

## 2016-09-05 NOTE — Progress Notes (Signed)
Physical Therapy Session Note  Patient Details  Name: [name] [name] MRN: [id number] Date of Birth: January 01, 1941  Today's Date: 09/05/2016 PT Individual Time: 1551-1620 PT Individual Time Calculation (min): 29 min   Short Term Goals: Week 1:  PT Short Term Goal 1 (Week 1): = LTGs due to LOS  Skilled Therapeutic Interventions/Progress Updates:  Pt received in bed requesting to stay in bed since he just received an enema. Pt agreeable to bed level exercises & performed the following: ankle pumps, glute sets, short arc quads, and hip abduction all with cuing for proper technique. Pt then c/o feeling as though he may need to have a BM. Pt transferred supine>sitting EOB with use of bed rails, HOB elevated, and min assist. Pt completed slide board transfer bed>w/c with min assist to maintain LLE NWB. At end of session pt left sitting on [facility] with RN present to supervise.   Pt appears to have BLE edema & RN made aware.  Therapy Documentation Precautions:  Precautions Precautions: Fall Restrictions Weight Bearing Restrictions: Yes RLE Weight Bearing: Weight bearing as tolerated LLE Weight Bearing: Non weight bearing Other Position/Activity Restrictions: bed to chair only for 8 weeks; WBAT RLE for transfers only  Pain: Pt c/o burning in penis & rectum - RN aware.  See Function Navigator for Current Functional Status.   Therapy/Group: Individual Therapy  [name] 09/05/2016, 5:34 PM

## 2016-09-05 NOTE — Progress Notes (Signed)
Pharmacy Antibiotic Note  [name] [name] is a 76 y.o. male admitted on 08/31/2016 with pneumonia.  Pharmacy has been consulted for Vancomycin dosing.  Afebrile, wbc down to 8.9. Vancomycin trough is at goal at 17.   Plan: Continue vancomycin to 750 mg IV q12h Continue ceftriaxone 1g IV q24h Monitor renal function closely  Height: '5\' 6"'$  (167.6 cm) Weight: 185 lb 6.4 oz (84.1 kg) IBW/kg (Calculated) : 63.8  Temp (24hrs), Avg:98.1 F (36.7 C), Min:97.9 F (36.6 C), Max:98.3 F (36.8 C)   Recent Labs Lab 09/01/16 0427 09/02/16 0608 09/03/16 0119 09/04/16 0537 09/05/16 0552 09/05/16 1013 09/05/16 1925  WBC 6.9 10.6* 26.9*  --  8.9 9.1  --   CREATININE 0.77  --  1.15 0.87 0.84  --   --   VANCOTROUGH  --   --   --   --   --   --  17    Estimated Creatinine Clearance: 73.7 mL/min (by C-G formula based on SCr of 0.84 mg/dL).    Antimicrobials this admission:  3/16 CTX >> 3/17 vanc>>  Dose adjustments this admission:  3/17 vanc '1000mg'$  q12h >> 750 mg q12 3/19: Vanc trough 17>>no changes Microbiology results:  3/5 MRSA PCR: neg 3/16 UCx: enterobacter>sensitive to ceftriaxone   Erin Hearing PharmD., BCPS Clinical Pharmacist Pager [phone] 09/05/2016 8:55 PM

## 2016-09-06 ENCOUNTER — Inpatient Hospital Stay (HOSPITAL_COMMUNITY): Payer: Medicare Other | Admitting: Physical Therapy

## 2016-09-06 ENCOUNTER — Inpatient Hospital Stay (HOSPITAL_COMMUNITY): Payer: Medicare Other

## 2016-09-06 ENCOUNTER — Telehealth: Payer: Self-pay | Admitting: Internal Medicine

## 2016-09-06 ENCOUNTER — Inpatient Hospital Stay (HOSPITAL_COMMUNITY): Payer: Medicare Other | Admitting: Occupational Therapy

## 2016-09-06 ENCOUNTER — Inpatient Hospital Stay (HOSPITAL_COMMUNITY): Payer: Medicare Other | Admitting: Speech Pathology

## 2016-09-06 LAB — BASIC METABOLIC PANEL
ANION GAP: 9 (ref 5–15)
BUN: 15 mg/dL (ref 6–20)
CO2: 26 mmol/L (ref 22–32)
Calcium: 8.4 mg/dL — ABNORMAL LOW (ref 8.9–10.3)
Chloride: 96 mmol/L — ABNORMAL LOW (ref 101–111)
Creatinine, Ser: 0.8 mg/dL (ref 0.61–1.24)
GFR calc Af Amer: >60 mL/min (ref >60)
Glucose, Bld: 89 mg/dL (ref 65–99)
POTASSIUM: 3.6 mmol/L (ref 3.5–5.1)
SODIUM: 131 mmol/L — AB (ref 135–145)

## 2016-09-06 LAB — PROTIME-INR
INR: 1.68
Prothrombin Time: 20 seconds — ABNORMAL HIGH (ref 11.4–15.2)

## 2016-09-06 MED ORDER — METOPROLOL TARTRATE 25 MG PO TABS
37.5000 mg | ORAL_TABLET | Freq: Two times a day (BID) | ORAL | Status: DC
  Administered 2016-09-06 – 2016-09-07 (×2): 37.5 mg via ORAL
  Filled 2016-09-06 (×2): qty 1

## 2016-09-06 NOTE — Progress Notes (Signed)
Occupational Therapy Session Note  Patient Details  Name: [name] [name] MRN: [id number] Date of Birth: 25-Aug-1940  Today's Date: 09/06/2016 OT Individual Time: 0805-0900 OT Individual Time Calculation (min): 55 min    Short Term Goals: Week 1:  OT Short Term Goal 1 (Week 1): STGs = LTGs  Skilled Therapeutic Interventions/Progress Updates:    1:1 OT session focused on LB dressing techniques, transfer training, and improved sit<>stand. Pt instructed on use of reacher to don pants and doff socks requiring mod instructional cues to utilize grasp/release function of reacher and how to don sock onto sock-aid. Sit<>stand with mod A and multimodal cues to faciilitate forward weight shift and hand placement. Pt requires constant reminder of NWB LLE as he gets confused as to which LE is non-weightbearing. Squat-pivot transfer from bed>wc with mod A, and incontinent of bowel. Lateral scoot to wide drop arm BSC w/ min A, but pt unable to empty bowels more. Total A for peri-care and brief change with Min A to maintain standing balance. Pt returned to wc at end of session and left with needs met and family present.  Therapy Documentation Precautions:  Precautions Precautions: Fall Restrictions Weight Bearing Restrictions: Yes RLE Weight Bearing: Weight bearing as tolerated LLE Weight Bearing: Non weight bearing Other Position/Activity Restrictions: bed to chair only for 8 weeks; WBAT RLE for transfers only Pain: Pain Assessment Pain Assessment: 0-10 Pain Score: 5  Pain Type: Acute pain Pain Location: Bladder Pain Orientation: Lower Pain Descriptors / Indicators: Aching Pain Intervention(s): Repositioned ADL: ADL ADL Comments: refer to functional navigator  See Function Navigator for Current Functional Status.   Therapy/Group: Individual Therapy  [name] 09/06/2016, 3:31 PM

## 2016-09-06 NOTE — Progress Notes (Signed)
Patient is transported for the Xray via bed. Camillia Herter RN

## 2016-09-06 NOTE — Progress Notes (Signed)
[place] PHYSICAL MEDICINE & REHABILITATION     PROGRESS NOTE  Subjective/Complaints:  Pt seen laying in bed this AM.  He slept better overnight.  He has questions about his discharge date.  He was seen again by CT surg yesterday.  ROS: Denies CP, SOB, N/V/D.  Objective: Vital Signs: Blood pressure (!) 119/46, pulse (!) 55, temperature 97.7 F (36.5 C), temperature source Oral, resp. rate 18, height '5\' 6"'$  (1.676 m), weight 84.1 kg (185 lb 6.4 oz), SpO2 96 %. Dg Chest 2 View  Result Date: 09/06/2016 CLINICAL DATA:  Pneumonia. EXAM: CHEST  2 VIEW COMPARISON:  Radiograph of September 02, 2016. FINDINGS: Stable cardiomediastinal silhouette. Atherosclerosis of thoracic aorta is noted. Status post coronary artery bypass graft. No pneumothorax is noted. Mild bilateral pleural effusions are noted. Stable bilateral perihilar and basilar interstitial densities are noted concerning for edema or inflammation. Bony thorax is unremarkable. IMPRESSION: Aortic atherosclerosis. Mild bilateral pleural effusions. Stable bilateral pulmonary edema or inflammation. Electronically Signed   By: Marijo Conception, M.D.   On: 09/06/2016 07:56    Recent Labs  09/05/16 0552 09/05/16 1013  WBC 8.9 9.1  HGB 9.3* 9.5*  HCT 28.2* 28.8*  PLT 307 341    Recent Labs  09/05/16 0552 09/06/16 0304  NA 130* 131*  K 3.4* 3.6  CL 93* 96*  GLUCOSE 91 89  BUN 22* 15  CREATININE 0.84 0.80  CALCIUM 8.2* 8.4*   CBG (last 3)  No results for input(s): GLUCAP in the last 72 hours.  Wt Readings from Last 3 Encounters:  08/31/16 84.1 kg (185 lb 6.4 oz)  08/22/16 71.7 kg (158 lb)  08/18/16 71.8 kg (158 lb 6 oz)    Physical Exam:  BP (!) 119/46 (BP Location: Left Leg)   Pulse (!) 55   Temp 97.7 F (36.5 C) (Oral)   Resp 18   Ht '5\' 6"'$  (1.676 m)   Wt 84.1 kg (185 lb 6.4 oz)   SpO2 96%   BMI 29.92 kg/m  Constitutional: He appears well-developed and well-nourished.  HENT: Normocephalic and atraumatic.  Eyes: EOMI.  No discharge.  Cardiovascular: RRR. No JVD. +murmur. Respiratory: Effort normal. Clear GI: Soft. Bowel sounds are normal.  Musculoskeletal: He exhibits edema and tenderness.  GU: Scrotal edema, improving.  Neurological: He is alert.  HOH Alert, confused Motor: RUE 4+/5 proximal to distal LUE: 4/5 proximal to distal RLE: 3-/5 proximal to distal LLE: 3/5 HF, 3+/5 KE, 4-/5 ADF/PF  Skin: Skin is warm and dry. Dressing to ORIF c/d/i Psychiatric: His mood appears anxious. He is slowed.   Assessment/Plan: 1. Functional deficits secondary to closed pelvic ring fracture with retroperitoneal hemorrhage in the left pelvis s/p ORIF of SI after fall which require 3+ hours per day of interdisciplinary therapy in a comprehensive inpatient rehab setting. Physiatrist is providing close team supervision and 24 hour management of active medical problems listed below. Physiatrist and rehab team continue to assess barriers to discharge/monitor patient progress toward functional and medical goals.  Function:  Bathing Bathing position   Position: Sitting EOB  Bathing parts Body parts bathed by patient: Right arm, Left arm, Chest, Abdomen, Front perineal area, Right upper leg, Left upper leg Body parts bathed by helper: Buttocks, Right lower leg, Left lower leg, Back  Bathing assist        Upper Body Dressing/Undressing Upper body dressing   What is the patient wearing?: Pull over shirt/dress     Pull over shirt/dress - Perfomed by  patient: Thread/unthread right sleeve, Thread/unthread left sleeve, Put head through opening, Pull shirt over trunk          Upper body assist Assist Level: Supervision or verbal cues      Lower Body Dressing/Undressing Lower body dressing   What is the patient wearing?: Pants, Non-skid slipper socks       Pants- Performed by helper: Thread/unthread right pants leg, Thread/unthread left pants leg, Pull pants up/down   Non-skid slipper socks- Performed by  helper: Don/doff right sock, Don/doff left sock                  Lower body assist Assist for lower body dressing:  (Max-Total A)      Toileting Toileting     Toileting steps completed by helper: Performs perineal hygiene, Adjust clothing after toileting, Adjust clothing prior to toileting    Toileting assist Assist level: Touching or steadying assistance (Pt.75%)   Transfers Chair/bed transfer   Chair/bed transfer method: Lateral scoot Chair/bed transfer assist level: Touching or steadying assistance (Pt > 75%) Chair/bed transfer assistive device: Sliding board     Locomotion Ambulation Ambulation activity did not occur: Safety/medical concerns (orders for transfers only)         Wheelchair   Type: Manual Max wheelchair distance: 150 Assist Level: Supervision or verbal cues  Cognition Comprehension Comprehension assist level: Follows complex conversation/direction with no assist  Expression Expression assist level: Expresses complex ideas: With no assist  Social Interaction Social Interaction assist level: Interacts appropriately 75 - 89% of the time - Needs redirection for appropriate language or to initiate interaction.  Problem Solving Problem solving assist level: Solves basic 25 - 49% of the time - needs direction more than half the time to initiate, plan or complete simple activities  Memory Memory assist level: Recognizes or recalls 25 - 49% of the time/requires cueing 50 - 75% of the time    Medical Problem List and Plan: 1.  Decreased functional mobility secondary to Closed pelvic ring fracture with disorder of left sacroiliac joint with retroperitoneal hemorrhage in the left side of the pelvis with subcutaneous hemorrhage in the left buttocks after fall status post ORIF with sacroiliac screw fixation of left SI joint 08/25/2016. Weightbearing as tolerated right lower extremity for transfers only nonweightbearing left lower extremity  Cont CIR 2.  Acute DVT:    Subcutaneous Lovenox, will hold coumadin until bronch  Vascular study with b/l peroneal and tibial veins  Monitor for any bleeding episodes 3. Pain Management: Oxycodone and Percocet as needed 4. Mood: Provide emotional support 5. Neuropsych: This patient is not capable of making decisions on his own behalf. 6. Skin/Wound Care: Routine skin checks 7. Fluids/Electrolytes/Nutrition: Routine I&Os 8. Acute blood loss anemia.   Hb 9.5 on 3/19  Cont to monitor 9. Hypertension.   Lopressor decreased to 50 mg twice a day on 3/19, decreased to 37.5 BID on 3/20  Maxzide 37.5.-25 milligrams daily 10. CAD status post CABG. No chest pain or shortness of breath. Plavix resumed.   On ASA, Coumadin on hold 11. Atrial fibrillation. Cardiac rate controlled. Continue Lopressor. 12. PVD with multiple revascularization procedures. Continue Plavix. 13. Right middle lobe lung/brain mass.    CTS consulted, bronch with bx this week.   14. Scrotal edema  Will d/c foley and start voiding trial  Continue Flomax 15. Hypothyroidism. Synthroid 16. Hyperlipidemia. Crestor 17. Constipation. Laxative assistance 18. Hyponatremia  Na+ 131 on 3/20  Cont to monitor 19. Hypokalemia  K+ 3.6  on 3/20  Supplementing  Cont to monitor 20. Hypoalbuminemia  Supplement initiated 3/15 21. Sleep disturbance  Trazodone PRN started 3/15 22. Leukocytosis: Resolved 23. Confusion  Multifactorial ?UTI +/- meds, +/- sleep +/- TBI 24. UTI  Cont abx 25. HCAP  Cont Vanc  CXR 3/20 reviewed, stable 26. Constipation    Increased meds  LOS (Days) 6 A FACE TO FACE EVALUATION WAS PERFORMED  Mizael Sagar Lorie Phenix 09/06/2016 8:20 AM

## 2016-09-06 NOTE — Plan of Care (Signed)
Problem: RH BOWEL ELIMINATION Goal: RH STG MANAGE BOWEL WITH ASSISTANCE STG Manage Bowel with Assistance. Mod  Outcome: Not Progressing Total assist Goal: RH STG MANAGE BOWEL W/MEDICATION W/ASSISTANCE STG Manage Bowel with Medication with Assistance. MOd  Outcome: Not Progressing Sorbitol today  Problem: RH BLADDER ELIMINATION Goal: RH STG MANAGE BLADDER WITH ASSISTANCE STG Manage Bladder With Assistance. Mod  Outcome: Progressing Foley discontinued today

## 2016-09-06 NOTE — Telephone Encounter (Signed)
Tc to the pt to make aware an appt has been scheduled for him to see Dr. Julien Nordmann on 4/3 at 215pm and 145pm labs. Made aware to arrive at 130pm in order to be checked in on time.

## 2016-09-06 NOTE — Progress Notes (Signed)
Physical Therapy Session Note  Patient Details  Name: [name] [name] MRN: [id number] Date of Birth: 09/17/1940  Today's Date: 09/06/2016 PT Individual Time: 0900 (make up)-0945 PT Individual Time Calculation (min): 45 min   Short Term Goals: Week 1:  PT Short Term Goal 1 (Week 1): = LTGs due to LOS  Skilled Therapeutic Interventions/Progress Updates: Pt presented in w/c agreeable to therapy. Pt propelled to rehab gym with min cues for turns. Pt stating prefer to trial SB transfer vs squat pivot. Performed SB w/c to/from mat transfer x 4 with cues for head/hip relationship, placement of SB, and sequencing. Performed initially with modA progressing to [place]. Performed tricep pushes at mat 3 x 5 reps. Pt able to propel back to room with supervision and left in w/c with call bell within reach and wife present.      Therapy Documentation Precautions:  Precautions Precautions: Fall Restrictions Weight Bearing Restrictions: Yes RLE Weight Bearing: Weight bearing as tolerated LLE Weight Bearing: Non weight bearing Other Position/Activity Restrictions: bed to chair only for 8 weeks; WBAT RLE for transfers only  Pain: Pain Assessment Pain Assessment: No/denies pain  See Function Navigator for Current Functional Status.   Therapy/Group: Individual Therapy  Lavance Beazer  Cerinity Zynda, PTA  09/06/2016, 9:55 AM

## 2016-09-06 NOTE — Progress Notes (Signed)
Speech Language Pathology Discharge Summary  Patient Details  Name: [name] [name] MRN: [id number] Date of Birth: 11-19-1940  Today's Date: 09/06/2016 SLP Individual Time: 1300-1330 SLP Individual Time Calculation (min): 30 min   Skilled Therapeutic Interventions:  Skilled treatment session focused on cognitive goals. SLP provided education on scope of ST services and family feels that pt is now at baseline level of functioning which included "mild memory deficits." SLP spoke with OT, PT and PA and he is making progress in each discipline and cognition doesn't appear to impact progress.      Patient has met 2 of 3 long term goals.  Patient to discharge at [facility] level.  Reasons goals not met: Sustained attention continues to be impaired by internal/medical distractions   Clinical Impression/Discharge Summary:   Pt to discharge from [place] at [place] A level of assist whic wife and children state they can provide needed level of assistance. At this time, pt to discharge from services d/t family request as they report that pt is at baseline level of function.   Care Partner:  Caregiver Able to Provide Assistance: Yes  Type of Caregiver Assistance: Cognitive  Recommendation:  None      Equipment: [name]   Reasons for discharge: Treatment goals met (Pt's family state that pt is at baseline level of functioning.)   Patient/Family Agrees with Progress Made and Goals Achieved: Yes   Function:  Eating Eating   Modified Consistency Diet: No Eating Assist Level: Set up assist for   Eating Set Up Assist For: Opening containers       Cognition Comprehension Comprehension assist level: Follows basic conversation/direction with no assist  Expression   Expression assist level: Expresses basic needs/ideas: With no assist  Social Interaction Social Interaction assist level: Interacts appropriately 75 - 89% of the time - Needs redirection for appropriate language or to initiate  interaction.  Problem Solving Problem solving assist level: Solves basic 25 - 49% of the time - needs direction more than half the time to initiate, plan or complete simple activities  Memory Memory assist level: Recognizes or recalls 50 - 74% of the time/requires cueing 25 - 49% of the time   [name] B. Rutherford Nail, M.S., CCC-SLP Speech-Language Pathologist  Hanna Ra 09/06/2016, 2:48 PM

## 2016-09-06 NOTE — Progress Notes (Signed)
Physical Therapy Session Note  Patient Details  Name: [name] [name] MRN: [id number] Date of Birth: [birth year]  Today's Date: 09/06/2016 PT Individual Time: 1100-1158 and 1415-1500 PT Individual Time Calculation (min): 58 min and 45 min  Short Term Goals: Week 1:  PT Short Term Goal 1 (Week 1): = LTGs due to LOS  Skilled Therapeutic Interventions/Progress Updates:   Treatment 1: Patient in bed after bowel incontinence episode with wife present, RN reporting to stay in room due to bowel urgency/incontinence. Patient agreeable to attempt to empty bowels on BSC. Patient transferred supine > sit EOB using rail with min A overall and max verbal cues for sequencing and technique. Sitting EOB, he was able to thread LLE and required assist for threading RLE and pulling shorts over hips. Performed lateral scoot transfer from bed to and from drop arm commode with min A overall and verbal cues for anterior weight shift, head hips relationship, and maintaining LLE NWB with total A for clothing management with sit <> squat using arm rests to achieve bottom clearance with no void. Instructed patient in lateral scooting along EOB and sit <> supine to L x 2 with use of leg lifter with supervision and increased time with max verbal cues for sequencing. Patient required prolonged rest break with cues for pursed lip breathing due to shortness of breath with activity. During break discussed HHPT f/u and wife reports they have talked to someone about having ramp installed but has not been started yet. Instructed in supine BLE therex for ankle pumps and heel raises until patient reported bowel incontinence. Performed rolling to R and L with supervision and max verbal cues for technique and hand placement. Patient with second loose bowel episode after total A for perineal hygiene. Patient left with nurse tech and wife in room.   Treatment 2: Patient asleep in bed, easily aroused but confusing PT with nurse and did not  recall previous therapy or use of leg lifter. Patient eventually agreeable to participate. Patient required max cues for sequencing supine > sit with HOB flat and no rail with min A overall to elevate trunk. Performed lateral scoot transfer from bed to and from wheelchair with min A to elevate LLE to maintain NWB and max verbal cues for head hips relationship, hand placement, and overall technique with increased time. Patient propelled wheelchair throughout rehab unit > 500 ft using BUE with supervision on tile and carpet surfaces and increased time to avoid obstacles and max verbal/visual cues for locking/unlocking brakes. Patient instructed in wheelchair pushup for pressure relief but unable to hold more than a couple seconds. Patient transferred sit > supine on bed with extra time and use of leg lifter, instructed in bridging with RLE and rolling to R and L using rail to straighten out pad and clothing behind patient. Patient left semi reclined in bed with all needs within reach and bed alarm on and wife in room.   Therapy Documentation Precautions:  Precautions Precautions: Fall Restrictions Weight Bearing Restrictions: Yes RLE Weight Bearing: Weight bearing as tolerated LLE Weight Bearing: Non weight bearing Other Position/Activity Restrictions: bed to chair only for 8 weeks; WBAT RLE for transfers only Pain: Pain Assessment Pain Assessment: No/denies pain   See Function Navigator for Current Functional Status.   Therapy/Group: Individual Therapy  Camden Mazzaferro, Murray Hodgkins 09/06/2016, 12:10 PM

## 2016-09-06 NOTE — Progress Notes (Signed)
Patient is back in the room after the Xray. Camillia Herter. RN

## 2016-09-07 ENCOUNTER — Inpatient Hospital Stay (HOSPITAL_COMMUNITY): Payer: Medicare Other | Admitting: Occupational Therapy

## 2016-09-07 ENCOUNTER — Inpatient Hospital Stay (HOSPITAL_COMMUNITY): Payer: Medicare Other | Admitting: Physical Therapy

## 2016-09-07 ENCOUNTER — Encounter (HOSPITAL_COMMUNITY): Payer: Medicare Other | Admitting: Psychology

## 2016-09-07 DIAGNOSIS — S32810D Multiple fractures of pelvis with stable disruption of pelvic ring, subsequent encounter for fracture with routine healing: Principal | ICD-10-CM

## 2016-09-07 LAB — BASIC METABOLIC PANEL
Anion gap: 10 (ref 5–15)
BUN: 17 mg/dL (ref 6–20)
CHLORIDE: 98 mmol/L — AB (ref 101–111)
CO2: 25 mmol/L (ref 22–32)
Calcium: 8.4 mg/dL — ABNORMAL LOW (ref 8.9–10.3)
Creatinine, Ser: 0.88 mg/dL (ref 0.61–1.24)
GFR calc Af Amer: >60 mL/min (ref >60)
GLUCOSE: 88 mg/dL (ref 65–99)
Potassium: 3.7 mmol/L (ref 3.5–5.1)
SODIUM: 133 mmol/L — AB (ref 135–145)

## 2016-09-07 LAB — PROTIME-INR
INR: 1.62
Prothrombin Time: 19.4 seconds — ABNORMAL HIGH (ref 11.4–15.2)

## 2016-09-07 MED ORDER — METOPROLOL TARTRATE 12.5 MG HALF TABLET
12.5000 mg | ORAL_TABLET | Freq: Two times a day (BID) | ORAL | Status: DC
  Administered 2016-09-07 – 2016-09-16 (×15): 12.5 mg via ORAL
  Filled 2016-09-07 (×17): qty 1

## 2016-09-07 MED ORDER — NYSTATIN 100000 UNIT/GM EX POWD
Freq: Two times a day (BID) | CUTANEOUS | Status: DC
  Administered 2016-09-07 – 2016-09-14 (×11): via TOPICAL
  Administered 2016-09-14: 1 via TOPICAL
  Administered 2016-09-15 – 2016-09-16 (×3): via TOPICAL
  Filled 2016-09-07: qty 15

## 2016-09-07 MED ORDER — GABAPENTIN 100 MG PO CAPS
100.0000 mg | ORAL_CAPSULE | Freq: Every day | ORAL | Status: DC
  Administered 2016-09-07 – 2016-09-15 (×9): 100 mg via ORAL
  Filled 2016-09-07 (×9): qty 1

## 2016-09-07 NOTE — Consult Note (Signed)
Neuropsychological Consultation   Patient:   [name] [name]   DOB:   [birth year]  MR Number:  [id number]  Location:  [facility] [address] [facility] [address] [id number] [place] [address] Dept: [place]: [phone]           Date of Service:   09/07/2016  Start Time:   1 PM End Time:   1:55 PM  Provider/Observer:   Ilean Skill, Psy.D.       Billing Code/Service: [phone] 4 units  Chief Complaint:    No chief complaint on file.   Reason for Service:  [name] [name] a 76 year old male with a history of cardiovascular issues. On 08/22/2016 the patient is reported to a fallen from a ladder while attempting to clean out his stove pipe.  While the patient had denied LOC, clinical interview today he reported that he remembered climbing the ladder and falling but did have a brief LOC.  He does not recall hitting the ground and first recall is neighbors calling over to ask if he was ok.  He reports that he attempted to get up and could not and let them know.  Take to [facility] by EMS.  Recalls that period of time.  There were fractures in his pelvis and subcutaneous hemorrhage in left buttocks as well is fractures in the left transverse process of L2-S1 during the CT scans there was also indications of a small pulmonary nodules. There is suspicion of a mass in the right middle lobe of his lung.   Current Status:  The patient is doing well from a standpoint of residual concussive effects. His family (wife and daughter) both report that his cognitive functioning appears to of return to baseline in general. The patient denies any significant anxiety or depression but did show signs of being emotionally upset during part of our conversation. While he has had some indication reports to him about the mass in his lung the family is avoiding talking about this and they have not talked about other possible concerns as far as  metastasis. At this point, these issues were not addressed directly by me and we are waiting until more definitive information is available as the patient may be come confused or have a negative impact on his recovery from his physical injuries.  Reliability of Information: Information was obtained from review of available medical records, consultation with treatment team, as well as 1 hour clinical interview and interaction with the patient, his wife, and his daughter.  Behavioral Observation: [name] [name]  presents as a 76 y.o.-year-old Right Caucasian Male who appeared his stated age. his dress was Appropriate and he was Well Groomed and his manners were Appropriate to the situation.  his participation was indicative of Appropriate behaviors.  There were physical disabilities noted.  he displayed an appropriate level of cooperation and motivation.     Interactions:    Active Appropriate  Attention:   abnormal and attention span appeared shorter than expected for age  Memory:   abnormal; remote memory intact, recent memory impaired  Visuo-spatial:  within normal limits  Speech (Volume):  normal  Speech:   normal; normal  Thought Process:  Coherent and Circumstantial  Though Content:  WNL; not suicidal  Orientation:   person, place and situation  Judgment:   Fair  Planning:   Fair  Affect:    Blunted  Mood:    While the patient denies any symptoms related  to significant depressive issues I do think that he has some degree of awareness of the issues and concerns around the possible mass in his lungs. The family is making a great deal of effort to avoid this issue when discussing with him and I think he is also avoiding this issue as well and there has likely some awareness of concern on his part at some level.  Insight:   Fair  Intelligence:   normal  Medical History:   Past Medical History:  Diagnosis Date  . Atrial fibrillation ([place])    no hx of reported at preop visit  of 04/21/11   . CAD (coronary artery disease)   . Carotid artery occlusion    left carotid endarterectomy  . Chronic kidney disease    AAA repair with reimplant of renals   . CHRONIC OBSTRUCTIVE PULMONARY DISEASE    pt denied at visit of 04/21/11   . CORONARY ARTERY DISEASE   . Disorder of left sacroiliac joint 08/24/2016  . DIVERTICULOSIS OF COLON   . GERD   . H/O hiatal hernia   . Headache(784.0)    Hx: of years of years  . HYPERTENSION   . HYPOTHYROIDISM   . JOINT EFFUSION, KNEE   . KNEE, ARTHRITIS, DEGEN./OSTEO    right knee   . Myocardial infarction    1994   . NEPHROLITHIASIS, HX OF   . OSTEOPOROSIS   . Other dysphagia   . PERIPHERAL VASCULAR DISEASE    AAA - 1994 with reimplant of renals   . Peripheral vascular disease ([place])    subclavian stenosis PTA - 3/08   . Pure hypercholesterolemia   . Renal artery stenosis ([facility])         Sexual History:   History  Sexual Activity  . Sexual activity: Not on file     Family Med/Psych History:  Family History  Problem Relation Age of Onset  . Hypertension Mother   . Heart disease Father   . Heart attack Father   . Cancer Brother     Lung  . Diabetes Sister     Risk of Suicide/Violence: low the patient denies any suicidal ideation and there is no past history or indication of suicide risk. The patient is not homicidal.  Impression/DX:  The patient was living independently with his wife and doing fairly well even though he had had significant cardiovascular issues prior. The patient was attempting to do some home maintenance when he fell off a ladder. He fractured his pelvis, head injury to his back and had numerous hematoma. The patient is coping fairly well from an emotional standpoint with all that is happened and really shows awareness. While the patient likely had a mild concussion most of the symptoms have resolved. There is some significant concern about how the patient will react emotionally depending on the results  from a planned biopsy of his lungs and continued review of head CT. There is speculation and or disagreement about what the head CT results were and what the indications are. There are some concerns about metastasis. However, they're going to do a lung biopsy on Friday.  Disposition/Plan:  With these concerns about how the patient will react to further medical issues and news I will remain available to consult regarding this patient once he goes through the medical procedures on Friday.       Electronically Signed   _______________________ Ilean Skill, Psy.D.

## 2016-09-07 NOTE — Progress Notes (Signed)
Physical Therapy Session Note  Patient Details  Name: [name] [name] MRN: [id number] Date of Birth: [birth year]  Today's Date: 09/07/2016 PT Individual Time: 1530-1600 PT Individual Time Calculation (min): 30 min   Short Term Goals: Week 2:  PT Short Term Goal 1 (Week 2): = LTGs due to anticipated LOS  Skilled Therapeutic Interventions/Progress Updates:   WC mobility. 224f and 2521fwith supervision assist from PT. Moderate cues for direction and use of signs to find familiar locations in rehab unit.   Blocked practice transfer training x 3 to R and x3 to L. Min assist progressing to supervision assist from PT with mod verbal and visual instruction for proper NWB with transfer to the L.   [facility] and all needs met.        Therapy Documentation Precautions:  Precautions Precautions: Fall Restrictions Weight Bearing Restrictions: Yes RLE Weight Bearing: Weight bearing as tolerated LLE Weight Bearing: Non weight bearing Other Position/Activity Restrictions: bed to chair only for 8 weeks; WBAT RLE for transfers only Pain: 0/10 at rest  See Function Navigator for Current Functional Status.   Therapy/Group: Individual Therapy  AuLorie Phenix/21/2018, 4:40 PM

## 2016-09-07 NOTE — Progress Notes (Signed)
Occupational Therapy Session Note  Patient Details  Name: [name] [name] MRN: [id number] Date of Birth: [birth year]  Today's Date: 09/07/2016 OT Individual Time: 0830-0900 OT Individual Time Calculation (min): 30 min    Short Term Goals: Week 1:  OT Short Term Goal 1 (Week 1): STGs = LTGs  Skilled Therapeutic Interventions/Progress Updates: 1:1 Focus on bed mobility with HOB slightly elevated with min A and extra time (A for management of left LE). Pt sat UB and engaged in UB bathing and dressing with setup. Pt continues to demonstrate decr sequencing and recall of information (working memory) requiring mod A. Wife was present for session. Pt able to squat pivot up to the [facility] with close supervision and mod A to return to supine (A for bilateral LEs)     Therapy Documentation Precautions:  Precautions Precautions: Fall Restrictions Weight Bearing Restrictions: Yes RLE Weight Bearing: Weight bearing as tolerated LLE Weight Bearing: Non weight bearing Other Position/Activity Restrictions: bed to chair only for 8 weeks; WBAT RLE for transfers only Pain: Pain Assessment Pain Assessment: No/denies pain Pain Score: 0-No pain  See Function Navigator for Current Functional Status.   Therapy/Group: Individual Therapy  Willeen Cass [facility] 09/07/2016, 11:38 AM

## 2016-09-07 NOTE — Progress Notes (Signed)
Occupational Therapy Session Note  Patient Details  Name: KHRYSTIAN SCHAUF MRN: [id number] Date of Birth: 1940-12-15  Today's Date: 09/07/2016 OT Individual Time: 1100-1200 OT Individual Time Calculation (min): 60 min    Short Term Goals: Week 1:  OT Short Term Goal 1 (Week 1): STGs = LTGs  Skilled Therapeutic Interventions/Progress Updates:    OT treatment session focused on transfer training, improved sit<>stand, problem solving, and L hand coordination. Lateral transfer from wc > therapy mat to R and L side with overall min A and verbal cues for sequencing and technique. Lateral scoot along edge of mat with focus on anterior weight shift to increase hip clearance on mat. Peg board puzzle in sitting with increased time and min cues to problem solve and copy pattern. Addressed L UE coordination using medium red theraputty-focus on in-hand manipulation, translation and rotation. Pt returned to room at end of session and left with family present and lunch set-up.  Therapy Documentation Precautions:  Precautions Precautions: Fall Restrictions Weight Bearing Restrictions: Yes RLE Weight Bearing: Weight bearing as tolerated LLE Weight Bearing: Non weight bearing Other Position/Activity Restrictions: bed to chair only for 8 weeks; WBAT RLE for transfers only Pain: Pain Assessment Pain Assessment: No/denies pain Pain Score: 0-No pain ADL: ADL ADL Comments: refer to functional navigator :    See Function Navigator for Current Functional Status.   Therapy/Group: Individual Therapy  [name] 09/07/2016, 12:21 PM

## 2016-09-07 NOTE — Progress Notes (Signed)
Physical Therapy Weekly Progress Note  Patient Details  Name: [name] [name] MRN: [id number] Date of Birth: 1941-03-03  Beginning of progress report period: September 01, 2016 End of progress report period: September 07, 2016  Today's Date: 09/07/2016 PT Individual Time: 0900-1000 and 1435-1500 PT Individual Time Calculation (min): 60 min and 25 min  Patient has met 3 of 8 long term goals. Patient making slow progress toward goals and currently requires min A overall for bed mobility and functional transfers. He remains limited by decreased memory/recall and confusion. Family education ongoing.   Patient continues to demonstrate the following deficits muscle weakness and pain, decreased cardiorespiratoy endurance, decreased awareness, decreased problem solving, decreased safety awareness, decreased memory and delayed processing and decreased standing balance and difficulty maintaining precautions and therefore will continue to benefit from skilled PT intervention to increase functional independence with mobility.  Patient not progressing toward long term goals.  See goal revision.  Plan of care revisions: bed mobility downgraded to supervision, DC standing goal.  PT Short Term Goals Week 2:  PT Short Term Goal 1 (Week 2): = LTGs due to anticipated LOS  Skilled Therapeutic Interventions/Progress Updates:   Treatment 1: Patient in bed upon arrival with wife in room. Patient instructed in rolling L and R using rail with min cues for sequencing and supervision to don brief total A. Transferred supine > sit using rail with HOB flat and use of leg lifter with supervision and increased time, mod verbal cues for sequencing and technique. Patient threaded pants sitting EOB with min A and setup, performed sit > stand from raised bed using RW with max verbal cues for hand placement and maintaining NWB LLE with min A and pulled pants over hips with total A in standing. Patient propelled wheelchair on rehab unit  with supervision and increased time. Patient required total cues for wheelchair setup in preparation for transfer and able to lock/unlock brakes and leg rests and remove arm rest. Patient instructed in lateral scoot transfer from bed > wheelchair <> mat table with min A overall and verbal cues for hand placement, head hips relationship, and transfer technique. Sit <> supine on mat table with supervision and increased time with mod-max cues for technique. Supine BLE therex for strengthening and ROM: heel slides, ankle pumps, assisted SLR, SAQ over bolster, x 10 eTherapy Documentationach LE per exercise. Patient left sitting in wheelchair with wife present and needs in reach.     Treatment 2: Patient in bed with wife and daughter present. Patient initially stating, "I can't," when asked to transfer to EOB without rail and HOB flat but agreeable to attempt. Patient sat EOB with increased time to L and use of leg lifter as well as pulling on sheets for leverage. Performed lateral scoot transfer from bed to wheelchair with min A to elevate LLE. Patient propelled wheelchair from room to and from ortho gym with supervision and increased time. Simulated car transfer to sedan height via squat/stand pivot with max-total multimodal cues and multiple demonstrations for technique as patient continued to state he needed help to lift LLE into car before attempting transfer. Eventually patient able to perform transfer to car with mod A and from car with min A and able to keep LLE NWB with max multimodal cues for hand placement, head hips relationship, sequencing, and technique. Patient able to lift BLE in/out of car independently. Wife demonstrated donning/doffing leg rest on wheelchair with verbal cues. Patient left sitting in wheelchair with all needs in reach  and family in room.    Precautions:  Precautions Precautions: Fall Restrictions Weight Bearing Restrictions: Yes RLE Weight Bearing: Weight bearing as  tolerated LLE Weight Bearing: Non weight bearing Other Position/Activity Restrictions: bed to chair only for 8 weeks; WBAT RLE for transfers only Pain: Unrated burning pain at site of catheter  See Function Navigator for Current Functional Status.  Therapy/Group: Individual Therapy  Dannell Raczkowski, Murray Hodgkins 09/07/2016, 10:04 AM

## 2016-09-07 NOTE — Progress Notes (Signed)
Social Work [name] [name], [name] [name]   Patient Care Conference Date of Service: 09/07/2016  2:54 PM      Hide copied text Hover for attribution information Inpatient RehabilitationTeam Conference and Plan of Care Update Date: 09/07/2016   Time: 2:30 PM      Patient Name: [name] [name]      Medical Record Number: [id number]  Date of Birth: 11-18-1940 Sex: Male         Room/Bed: 4M01C/4M01C-01 Payor Info: Payor: MEDICARE / Plan: MEDICARE PART A AND B / Product Type: *No Product type* /     Admitting Diagnosis: FALL  Admit Date/Time:  08/31/2016  5:34 PM Admission Comments: No comment available    Primary Diagnosis:  <principal problem not specified> Principal Problem: <principal problem not specified>       Patient Active Problem List    Diagnosis Date Noted  . Slow transit constipation    . Acute lower UTI    . Confusion    . SOB (shortness of breath) 09/02/2016  . Urinary incontinence    . Abnormal urinalysis    . Leukocytosis    . HCAP (healthcare-associated pneumonia)    . Hypoalbuminemia due to protein-calorie malnutrition ([place])    . Hypokalemia    . Hyponatremia    . Urinary retention    . Sleep disturbance    . Acute bilateral deep vein thrombosis (DVT) of popliteal veins (HCC)    . Brain mass 08/31/2016  . Debility    . Benign essential HTN    . Scrotal edema    . Hyperlipidemia    . Constipation due to pain medication    . Fall from height of greater than 3 feet    . Acute blood loss anemia    . Lung mass    . Post-operative pain    . PVD (peripheral vascular disease) ([place])    . Coronary artery disease involving coronary bypass graft of native heart without angina pectoris    . PAF (paroxysmal atrial fibrillation) ([place])    . Disorder of left sacroiliac joint 08/24/2016  . Fall 08/22/2016  . Closed pelvic ring fracture ([place])    . Carotid artery stenosis, asymptomatic 08/12/2014  . Ejection murmur 07/03/2014  . Aftercare following  surgery of the circulatory system, [place] 02/11/2014  . Weakness-Abdomin and Bilateral Thigh / Leg 02/11/2014  . Carotid stenosis 07/19/2013  . Occlusion and stenosis of carotid artery without mention of cerebral infarction 07/16/2013  . Impaired fasting glucose 07/14/2013  . Knee pain 05/23/2011  . Stiffness of joint, not elsewhere classified, lower leg 05/23/2011  . Muscle weakness (generalized) 05/23/2011  . Difficulty in walking(719.7) 05/23/2011  . Bruit 11/19/2010  . AAA (abdominal aortic aneurysm) ([place]) 11/19/2010  . OTHER DYSPHAGIA 04/27/2010  . ATRIAL FIBRILLATION 12/10/2008  . JOINT EFFUSION, KNEE 01/14/2008  . KNEE PAIN 01/14/2008  . KNEE, ARTHRITIS, DEGEN./OSTEO 12/11/2007  . Hypothyroidism 10/10/2007  . Pure hypercholesterolemia 10/10/2007  . Essential hypertension 10/10/2007  . Coronary atherosclerosis 10/10/2007  . Peripheral vascular disease ([place]) 10/10/2007  . CHRONIC OBSTRUCTIVE PULMONARY DISEASE 10/10/2007  . GERD 10/10/2007  . DIVERTICULOSIS OF COLON 10/10/2007  . OSTEOPOROSIS 10/10/2007  . NEPHROLITHIASIS, HX OF 10/10/2007  . Personal history of surgery to heart and great vessels, presenting hazards to health 10/10/2007  . CHOLECYSTECTOMY, HX OF 10/10/2007  . CORONARY ARTERY BYPASS GRAFT, HX OF 10/10/2007      Expected Discharge Date: Expected Discharge Date: 09/17/16   Team Members  Present: Physician leading conference: Dr. Delice Lesch Social Worker Present: Ovidio Kin, [name] Nurse Present: Heather Roberts, RN PT Present: Carney Living, PT OT Present: Cherylynn Ridges, OT PPS Coordinator present : Daiva Nakayama, RN, CRRN       Current Status/Progress Goal Weekly Team Focus  Medical     Decreased functional mobility secondary to Closed pelvic ring fracture with disorder of left sacroiliac joint with retroperitoneal hemorrhage in the left side of the pelvis with subcutaneous hemorrhage in the left buttocks after fall status post ORIF with sacroiliac screw fixation  of left SI joint 08/25/2016.   Improve mobility, transfers, edema, hyponatremia, infection  See above   Bowel/Bladder     incontinent of bowel,has miralax and senna scheduled, LBM 3/20(had several incontinent stools after being disimpacted). Foley cath reinserted 3/20 after failed voiding trial,urine is hazy, pt on rocephin and vanco IV for UTI.  normal bowel and bladder function with minimal assist  maintain bowel movement every 1-2 days, foley care q shift and prn.   Swallow/Nutrition/ Hydration               ADL's     Min A transfers, Min/mod A LB ADL  Min A overall  Transfers, pt/family education, LB dressing, cognitive training,    Mobility     min A  supervision WC level  functional transfers, bed mobility, maintaining precautions, generalized strengthening, cognition, pt/family education   Communication               Safety/Cognition/ Behavioral Observations             Pain     pt has occasional right shoulder or right leg pain, well controlled on prn roxicodone, however, the past few days has had c/o severe burning pain to groin/penis,Pain appears to be MASD, questionable yeast infection?   pain </= 2  assess pain q shift and prn,    Skin     groin MASD worsened with numerous incontinent stools on 3/20. severe pain to groin/testicles, extreme swelling to foreskin of penis and scrotum, wife states swelling is "better".  pt has severe echymosis from fall that preceded hospitalization, incision to lower pelvic area CDI with sutures, OTA.  L hip ORIF incision, CDI with foam dressing.   skin without breakdown upon d/c from rehab  monitor skin q shift and prn.     *See Care Plan and progress notes for long and short-term goals.   Barriers to Discharge: Transfers, ABLA, Hyponatremia, scrotal edema, hyponatremia, HCAP, UTI, lung mass, urinary retention     Possible Resolutions to Barriers:  Bx ?this week, follow labs, diuretics, IV abx     Discharge Planning/Teaching Needs:  Wife  is here daily and assisting with his care here. Wife and children to assist with his care. Neuro-psych to see today for coping      Team Discussion:  Goals supervision wheelchair level-min assist with B & D due to WB issues. ON IV antibiotics for pneumonia & UTI. Starting gabapentin for bladder spasms. Needs much cueing due to difficulty with sequencing. Pushing po. Adjusting BP meds currently too low. Foley placed back due to failed voiding trial. Bronchscopy end of the week.Switch flomax to night time. Family here daily to provide emotional support  Revisions to Treatment Plan:  DC 3/31    Continued Need for Acute Rehabilitation Level of Care: The patient requires daily medical management by a physician with specialized training in physical medicine and rehabilitation for the following conditions: Daily direction  of a multidisciplinary physical rehabilitation program to ensure safe treatment while eliciting the highest outcome that is of practical value to the patient.: Yes Daily medical management of patient stability for increased activity during participation in an intensive rehabilitation regime.: Yes Daily analysis of laboratory values and/or radiology reports with any subsequent need for medication adjustment of medical intervention for : Post surgical problems;Neurological problems;Urological problems;Other;Pulmonary problems   [name] [name] 09/07/2016, 2:54 PM       Patient ID: [name] [name], male   DOB: May 20, 1941, 76 y.o.   MRN: [id number]

## 2016-09-07 NOTE — Progress Notes (Signed)
[facility]     PROGRESS NOTE  Subjective/Complaints:  Pt seen laying in bed this AM.  Per wife, he slept better overnight.  Per nursing, foley needed to be reinserted yesterday.  He still has burning in his perineal area.   ROS: +Perineal burning. Denies CP, SOB, N/V/D.  Objective: Vital Signs: Blood pressure (!) 96/30, pulse (!) 52, temperature 97.9 F (36.6 C), temperature source Oral, resp. rate 17, height '5\' 6"'$  (1.676 m), weight 84.1 kg (185 lb 6.4 oz), SpO2 98 %. Dg Chest 2 View  Result Date: 09/06/2016 CLINICAL DATA:  Pneumonia. EXAM: CHEST  2 VIEW COMPARISON:  Radiograph of September 02, 2016. FINDINGS: Stable cardiomediastinal silhouette. Atherosclerosis of thoracic aorta is noted. Status post coronary artery bypass graft. No pneumothorax is noted. Mild bilateral pleural effusions are noted. Stable bilateral perihilar and basilar interstitial densities are noted concerning for edema or inflammation. Bony thorax is unremarkable. IMPRESSION: Aortic atherosclerosis. Mild bilateral pleural effusions. Stable bilateral pulmonary edema or inflammation. Electronically Signed   By: Marijo Conception, M.D.   On: 09/06/2016 07:56    Recent Labs  09/05/16 0552 09/05/16 1013  WBC 8.9 9.1  HGB 9.3* 9.5*  HCT 28.2* 28.8*  PLT 307 341    Recent Labs  09/06/16 0304 09/07/16 0445  NA 131* 133*  K 3.6 3.7  CL 96* 98*  GLUCOSE 89 88  BUN 15 17  CREATININE 0.80 0.88  CALCIUM 8.4* 8.4*   CBG (last 3)  No results for input(s): GLUCAP in the last 72 hours.  Wt Readings from Last 3 Encounters:  08/31/16 84.1 kg (185 lb 6.4 oz)  08/22/16 71.7 kg (158 lb)  08/18/16 71.8 kg (158 lb 6 oz)    Physical Exam:  BP (!) 96/30   Pulse (!) 52   Temp 97.9 F (36.6 C) (Oral)   Resp 17   Ht '5\' 6"'$  (1.676 m)   Wt 84.1 kg (185 lb 6.4 oz)   SpO2 98%   BMI 29.92 kg/m  Constitutional: He appears well-developed and well-nourished.  HENT: Normocephalic and atraumatic.   Eyes: EOMI. No discharge.  Cardiovascular: RRR. No JVD. +murmur. Respiratory: Effort normal. Clear GI: Soft. Bowel sounds are normal.  Musculoskeletal: He exhibits edema and tenderness.  GU: Scrotal edema.  Neurological: He is alert.  HOH Alert, confused Motor: RUE 4+/5 proximal to distal LUE: 4/5 proximal to distal RLE: 3-/5 proximal to distal LLE: 3/5 HF, 3+/5 KE, 4-/5 ADF/PF (stable) Skin: Skin is warm and dry. Dressing to ORIF c/d/i Psychiatric: His mood appears anxious, improving.   Assessment/Plan: 1. Functional deficits secondary to closed pelvic ring fracture with retroperitoneal hemorrhage in the left pelvis s/p ORIF of SI after fall which require 3+ hours per day of interdisciplinary therapy in a comprehensive inpatient rehab setting. Physiatrist is providing close team supervision and 24 hour management of active medical problems listed below. Physiatrist and rehab team continue to assess barriers to discharge/monitor patient progress toward functional and medical goals.  Function:  Bathing Bathing position   Position: Sitting EOB  Bathing parts Body parts bathed by patient: Right arm, Left arm, Chest, Abdomen, Front perineal area, Right upper leg, Left upper leg Body parts bathed by helper: Buttocks, Right lower leg, Left lower leg, Back  Bathing assist        Upper Body Dressing/Undressing Upper body dressing   What is the patient wearing?: Pull over shirt/dress     Pull over shirt/dress - Perfomed by patient:  Thread/unthread right sleeve, Thread/unthread left sleeve, Put head through opening, Pull shirt over trunk          Upper body assist Assist Level: Supervision or verbal cues      Lower Body Dressing/Undressing Lower body dressing   What is the patient wearing?: Pants, Non-skid slipper socks     Pants- Performed by patient: Thread/unthread right pants leg Pants- Performed by helper: Pull pants up/down, Thread/unthread left pants leg   Non-skid  slipper socks- Performed by helper: Don/doff right sock, Don/doff left sock                  Lower body assist Assist for lower body dressing: 2 Helpers      Toileting Toileting     Toileting steps completed by helper: Performs perineal hygiene, Adjust clothing after toileting, Adjust clothing prior to toileting    Toileting assist Assist level: Two helpers   Transfers Chair/bed transfer   Chair/bed transfer method: Lateral scoot Chair/bed transfer assist level: Touching or steadying assistance (Pt > 75%) Chair/bed transfer assistive device: Armrests     Locomotion Ambulation Ambulation activity did not occur: Safety/medical concerns (orders for transfers only)         Wheelchair   Type: Manual Max wheelchair distance: 500 ft Assist Level: Supervision or verbal cues  Cognition Comprehension Comprehension assist level: Follows basic conversation/direction with no assist  Expression Expression assist level: Expresses basic needs/ideas: With no assist  Social Interaction Social Interaction assist level: Interacts appropriately 75 - 89% of the time - Needs redirection for appropriate language or to initiate interaction.  Problem Solving Problem solving assist level: Solves basic 25 - 49% of the time - needs direction more than half the time to initiate, plan or complete simple activities  Memory Memory assist level: Recognizes or recalls 50 - 74% of the time/requires cueing 25 - 49% of the time    Medical Problem List and Plan: 1.  Decreased functional mobility secondary to Closed pelvic ring fracture with disorder of left sacroiliac joint with retroperitoneal hemorrhage in the left side of the pelvis with subcutaneous hemorrhage in the left buttocks after fall status post ORIF with sacroiliac screw fixation of left SI joint 08/25/2016. Weightbearing as tolerated right lower extremity for transfers only nonweightbearing left lower extremity  Cont CIR 2.  Acute DVT:    Subcutaneous Lovenox, holding coumadin until bronch  Vascular study with b/l peroneal and tibial veins  Monitor for any bleeding episodes 3. Pain Management: Oxycodone and Percocet as needed 4. Mood: Provide emotional support 5. Neuropsych: This patient is not capable of making decisions on his own behalf. 6. Skin/Wound Care: Routine skin checks  Nystatin ordered for perineal area 7. Fluids/Electrolytes/Nutrition: Routine I&Os 8. Acute blood loss anemia.   Hb 9.5 on 3/19  Cont to monitor 9. Hypertension.   Lopressor decreased to 50 mg twice a day on 3/19, decreased to 37.5 BID on 3/20, decreased to 12.5 on 3/21  Maxzide 37.5.-25 milligrams daily 10. CAD status post CABG. No chest pain or shortness of breath. Plavix resumed.   On ASA, Coumadin on hold 11. Atrial fibrillation. Cardiac rate controlled. Continue Lopressor. 12. PVD with multiple revascularization procedures. Continue Plavix. 13. Right middle lobe lung/brain mass.    CTS consulted, bronch with plans for bx this week.   14. Scrotal edema with urinary retention  Failed voiding trial, Foley replaced  Continue Flomax 15. Hypothyroidism. Synthroid 16. Hyperlipidemia. Crestor 17. Constipation. Laxative assistance 18. Hyponatremia  Na+ 133 on  3/21  Cont to monitor 19. Hypokalemia  K+ 3.7 on 3/21  Supplementing  Cont to monitor 20. Hypoalbuminemia  Supplement initiated 3/15 21. Sleep disturbance  Trazodone PRN started 3/15 22. Leukocytosis: Resolved 23. Confusion: Improving  Multifactorial ?UTI +/- meds, +/- sleep +/- TBI 24. UTI  Cont abx 25. HCAP  Cont Vanc  CXR 3/20 reviewed, stable 26. Constipation    Increased meds  LOS (Days) 7 A FACE TO FACE EVALUATION WAS PERFORMED  Yosmar Ryker Lorie Phenix 09/07/2016 9:39 AM

## 2016-09-07 NOTE — Progress Notes (Signed)
Social Work Patient ID: [name] [name], male   DOB: 12/26/1940, 76 y.o.   MRN: [id number]  Met with pt, daughter and wife to discuss team conference goals supervision wheelchair level and target discharge date 3/31. All pleased with this plan and feel it is enough time to reach these goals. Pt is trying his best and wife and daughter are here for emotional support. Will work toward discharge next week-ie equipment and home Health needs.  

## 2016-09-07 NOTE — Patient Care Conference (Signed)
Inpatient RehabilitationTeam Conference and Plan of Care Update Date: 09/07/2016   Time: 2:30 PM    Patient Name: [name] [name]      Medical Record Number: [id number]  Date of Birth: [birth year] Sex: Male         Room/Bed: 4M01C/4M01C-01 Payor Info: Payor: MEDICARE / Plan: MEDICARE PART A AND B / Product Type: *No Product type* /    Admitting Diagnosis: FALL  Admit Date/Time:  08/31/2016  5:34 PM Admission Comments: No comment available   Primary Diagnosis:  <principal problem not specified> Principal Problem: <principal problem not specified>  Patient Active Problem List   Diagnosis Date Noted  . Slow transit constipation   . Acute lower UTI   . Confusion   . SOB (shortness of breath) 09/02/2016  . Urinary incontinence   . Abnormal urinalysis   . Leukocytosis   . HCAP (healthcare-associated pneumonia)   . Hypoalbuminemia due to protein-calorie malnutrition ([place])   . Hypokalemia   . Hyponatremia   . Urinary retention   . Sleep disturbance   . Acute bilateral deep vein thrombosis (DVT) of popliteal veins ([facility])   . Brain mass 08/31/2016  . Debility   . Benign essential HTN   . Scrotal edema   . Hyperlipidemia   . Constipation due to pain medication   . Fall from height of greater than 3 feet   . Acute blood loss anemia   . Lung mass   . Post-operative pain   . PVD (peripheral vascular disease) ([place])   . Coronary artery disease involving coronary bypass graft of native heart without angina pectoris   . PAF (paroxysmal atrial fibrillation) ([place])   . Disorder of left sacroiliac joint 08/24/2016  . Fall 08/22/2016  . Closed pelvic ring fracture ([place])   . Carotid artery stenosis, asymptomatic 08/12/2014  . Ejection murmur 07/03/2014  . Aftercare following surgery of the circulatory system, [place] 02/11/2014  . Weakness-Abdomin and Bilateral Thigh / Leg 02/11/2014  . Carotid stenosis 07/19/2013  . Occlusion and stenosis of carotid artery without mention of cerebral infarction  07/16/2013  . Impaired fasting glucose 07/14/2013  . Knee pain 05/23/2011  . Stiffness of joint, not elsewhere classified, lower leg 05/23/2011  . Muscle weakness (generalized) 05/23/2011  . Difficulty in walking(719.7) 05/23/2011  . Bruit 11/19/2010  . AAA (abdominal aortic aneurysm) ([place]) 11/19/2010  . OTHER DYSPHAGIA 04/27/2010  . ATRIAL FIBRILLATION 12/10/2008  . JOINT EFFUSION, KNEE 01/14/2008  . KNEE PAIN 01/14/2008  . KNEE, ARTHRITIS, DEGEN./OSTEO 12/11/2007  . Hypothyroidism 10/10/2007  . Pure hypercholesterolemia 10/10/2007  . Essential hypertension 10/10/2007  . Coronary atherosclerosis 10/10/2007  . Peripheral vascular disease ([place]) 10/10/2007  . CHRONIC OBSTRUCTIVE PULMONARY DISEASE 10/10/2007  . GERD 10/10/2007  . DIVERTICULOSIS OF COLON 10/10/2007  . OSTEOPOROSIS 10/10/2007  . NEPHROLITHIASIS, HX OF 10/10/2007  . Personal history of surgery to heart and great vessels, presenting hazards to health 10/10/2007  . CHOLECYSTECTOMY, HX OF 10/10/2007  . CORONARY ARTERY BYPASS GRAFT, HX OF 10/10/2007    Expected Discharge Date: Expected Discharge Date: 09/17/16  Team Members Present: Physician leading conference: Dr. Delice Lesch Social Worker Present: Ovidio Kin, LCSW Nurse Present: Heather Roberts, RN PT Present: Carney Living, PT OT Present: Cherylynn Ridges, OT PPS Coordinator present : Daiva Nakayama, RN, CRRN     Current Status/Progress Goal Weekly Team Focus  Medical   Decreased functional mobility secondary to Closed pelvic ring fracture with disorder of left sacroiliac joint with retroperitoneal hemorrhage in the left side  of the pelvis with subcutaneous hemorrhage in the left buttocks after fall status post ORIF with sacroiliac screw fixation of left SI joint 08/25/2016.   Improve mobility, transfers, edema, hyponatremia, infection  See above   Bowel/Bladder   incontinent of bowel,has miralax and senna scheduled, LBM 3/20(had several incontinent stools after being  disimpacted). Foley cath reinserted 3/20 after failed voiding trial,urine is hazy, pt on rocephin and vanco IV for UTI.  normal bowel and bladder function with minimal assist  maintain bowel movement every 1-2 days, foley care q shift and prn.   Swallow/Nutrition/ Hydration             ADL's   Min A transfers, Min/mod A LB ADL  Min A overall  Transfers, pt/family education, LB dressing, cognitive training,    Mobility   min A  supervision WC level  functional transfers, bed mobility, maintaining precautions, generalized strengthening, cognition, pt/family education   Communication             Safety/Cognition/ Behavioral Observations            Pain   pt has occasional right shoulder or right leg pain, well controlled on prn roxicodone, however, the past few days has had c/o severe burning pain to groin/penis,Pain appears to be MASD, questionable yeast infection?   pain </= 2  assess pain q shift and prn,    Skin   groin MASD worsened with numerous incontinent stools on 3/20. severe pain to groin/testicles, extreme swelling to foreskin of penis and scrotum, wife states swelling is "better".  pt has severe echymosis from fall that preceded hospitalization, incision to lower pelvic area CDI with sutures, OTA.  L hip ORIF incision, CDI with foam dressing.   skin without breakdown upon d/c from rehab  monitor skin q shift and prn.      *See Care Plan and progress notes for long and short-term goals.  Barriers to Discharge: Transfers, ABLA, Hyponatremia, scrotal edema, hyponatremia, HCAP, UTI, lung mass, urinary retention    Possible Resolutions to Barriers:  Bx ?this week, follow labs, diuretics, IV abx    Discharge Planning/Teaching Needs:  Wife is here daily and assisting with his care here. Wife and children to assist with his care. Neuro-psych to see today for coping      Team Discussion:  Goals supervision wheelchair level-min assist with B & D due to WB issues. ON IV antibiotics  for pneumonia & UTI. Starting gabapentin for bladder spasms. Needs much cueing due to difficulty with sequencing. Pushing po. Adjusting BP meds currently too low. Foley placed back due to failed voiding trial. Bronchscopy end of the week.Switch flomax to night time. Family here daily to provide emotional support  Revisions to Treatment Plan:  DC 3/31   Continued Need for Acute Rehabilitation Level of Care: The patient requires daily medical management by a physician with specialized training in physical medicine and rehabilitation for the following conditions: Daily direction of a multidisciplinary physical rehabilitation program to ensure safe treatment while eliciting the highest outcome that is of practical value to the patient.: Yes Daily medical management of patient stability for increased activity during participation in an intensive rehabilitation regime.: Yes Daily analysis of laboratory values and/or radiology reports with any subsequent need for medication adjustment of medical intervention for : Post surgical problems;Neurological problems;Urological problems;Other;Pulmonary problems  Elease Hashimoto 09/07/2016, 2:54 PM

## 2016-09-08 ENCOUNTER — Inpatient Hospital Stay (HOSPITAL_COMMUNITY): Payer: Medicare Other

## 2016-09-08 ENCOUNTER — Inpatient Hospital Stay (HOSPITAL_COMMUNITY): Payer: Medicare Other | Admitting: Physical Therapy

## 2016-09-08 ENCOUNTER — Inpatient Hospital Stay (HOSPITAL_COMMUNITY): Payer: Medicare Other | Admitting: Occupational Therapy

## 2016-09-08 DIAGNOSIS — R609 Edema, unspecified: Secondary | ICD-10-CM

## 2016-09-08 LAB — CBC
HCT: 34.1 % — ABNORMAL LOW (ref 39.0–52.0)
HEMATOCRIT: 31 % — AB (ref 39.0–52.0)
Hemoglobin: 10.2 g/dL — ABNORMAL LOW (ref 13.0–17.0)
Hemoglobin: 11.1 g/dL — ABNORMAL LOW (ref 13.0–17.0)
MCH: 29.6 pg (ref 26.0–34.0)
MCH: 29.3 pg (ref 26.0–34.0)
MCHC: 32.9 g/dL (ref 30.0–36.0)
MCHC: 32.6 g/dL (ref 30.0–36.0)
MCV: 89.9 fL (ref 78.0–100.0)
MCV: 90 fL (ref 78.0–100.0)
Platelets: 369 10*3/uL (ref 150–400)
Platelets: 428 10*3/uL — ABNORMAL HIGH (ref 150–400)
RBC: 3.45 MIL/uL — ABNORMAL LOW (ref 4.22–5.81)
RBC: 3.79 MIL/uL — ABNORMAL LOW (ref 4.22–5.81)
RDW: 16.3 % — AB (ref 11.5–15.5)
RDW: 16.1 % — ABNORMAL HIGH (ref 11.5–15.5)
WBC: 6 10*3/uL (ref 4.0–10.5)
WBC: 6.9 10*3/uL (ref 4.0–10.5)

## 2016-09-08 LAB — BASIC METABOLIC PANEL
ANION GAP: 11 (ref 5–15)
BUN: 20 mg/dL (ref 6–20)
CALCIUM: 8.7 mg/dL — AB (ref 8.9–10.3)
CO2: 26 mmol/L (ref 22–32)
CREATININE: 1.03 mg/dL (ref 0.61–1.24)
Chloride: 95 mmol/L — ABNORMAL LOW (ref 101–111)
GFR calc Af Amer: >60 mL/min (ref >60)
GLUCOSE: 97 mg/dL (ref 65–99)
Potassium: 4 mmol/L (ref 3.5–5.1)
Sodium: 132 mmol/L — ABNORMAL LOW (ref 135–145)

## 2016-09-08 LAB — COMPREHENSIVE METABOLIC PANEL
ALT: 28 U/L (ref 17–63)
AST: 44 U/L — ABNORMAL HIGH (ref 15–41)
Albumin: 3.3 g/dL — ABNORMAL LOW (ref 3.5–5.0)
Alkaline Phosphatase: 126 U/L (ref 38–126)
Anion gap: 10 (ref 5–15)
BUN: 22 mg/dL — ABNORMAL HIGH (ref 6–20)
CO2: 26 mmol/L (ref 22–32)
Calcium: 8.9 mg/dL (ref 8.9–10.3)
Chloride: 96 mmol/L — ABNORMAL LOW (ref 101–111)
Creatinine, Ser: 1.23 mg/dL (ref 0.61–1.24)
GFR calc Af Amer: >60 mL/min (ref >60)
GFR calc non Af Amer: 54 mL/min — ABNORMAL LOW (ref >60)
Glucose, Bld: 108 mg/dL — ABNORMAL HIGH (ref 65–99)
Potassium: 4.6 mmol/L (ref 3.5–5.1)
Sodium: 132 mmol/L — ABNORMAL LOW (ref 135–145)
Total Bilirubin: 1.3 mg/dL — ABNORMAL HIGH (ref 0.3–1.2)
Total Protein: 6.4 g/dL — ABNORMAL LOW (ref 6.5–8.1)

## 2016-09-08 LAB — APTT: aPTT: 41 seconds — ABNORMAL HIGH (ref 24–36)

## 2016-09-08 LAB — PROTIME-INR
INR: 1.37
INR: 1.28
Prothrombin Time: 17 seconds — ABNORMAL HIGH (ref 11.4–15.2)
Prothrombin Time: 16.1 seconds — ABNORMAL HIGH (ref 11.4–15.2)

## 2016-09-08 LAB — VANCOMYCIN, TROUGH: Vancomycin Tr: 23 ug/mL (ref 15–20)

## 2016-09-08 MED ORDER — VANCOMYCIN HCL 10 G IV SOLR
1250.0000 mg | INTRAVENOUS | Status: AC
  Administered 2016-09-09: 1250 mg via INTRAVENOUS
  Filled 2016-09-08 (×2): qty 1250

## 2016-09-08 MED ORDER — ENSURE ENLIVE PO LIQD
237.0000 mL | Freq: Two times a day (BID) | ORAL | Status: DC
  Administered 2016-09-09 – 2016-09-14 (×10): 237 mL via ORAL

## 2016-09-08 MED ORDER — ACETAMINOPHEN 325 MG PO TABS
650.0000 mg | ORAL_TABLET | Freq: Four times a day (QID) | ORAL | Status: DC | PRN
  Administered 2016-09-09 – 2016-09-15 (×9): 650 mg via ORAL
  Filled 2016-09-08 (×11): qty 2

## 2016-09-08 MED ORDER — BUMETANIDE 1 MG PO TABS
1.0000 mg | ORAL_TABLET | Freq: Every day | ORAL | Status: DC
  Administered 2016-09-08 – 2016-09-16 (×8): 1 mg via ORAL
  Filled 2016-09-08 (×10): qty 1

## 2016-09-08 NOTE — Plan of Care (Signed)
Problem: RH Bathing Goal: LTG Patient will bathe with assist, cues/equipment (OT) LTG: Patient will bathe specified number of body parts with assist with/without cues using equipment (position)  (OT)  Goal upgraded 2/2 progress- ESD 3/22  Problem: RH Toilet Transfers Goal: LTG Patient will perform toilet transfers w/assist (OT) LTG: Patient will perform toilet transfers with assist, with/without cues using equipment (OT)  Goal upgraded 2/2 progression- ESD 3/22

## 2016-09-08 NOTE — Progress Notes (Addendum)
Initial Nutrition Assessment  DOCUMENTATION CODES:   Not applicable  INTERVENTION:  Discontinue Prostat.  Provide Ensure Enlive po BID, each supplement provides 350 kcal and 20 grams of protein  Encourage adequate PO intake.   NUTRITION DIAGNOSIS:   Increased nutrient needs related to  (therapy) as evidenced by estimated needs.  GOAL:   Patient will meet greater than or equal to 90% of their needs  MONITOR:   PO intake, Supplement acceptance, Labs, Weight trends, Skin, I & O's  REASON FOR ASSESSMENT:   Malnutrition Screening Tool    ASSESSMENT:   76 y.o. male with history of PVD with multiple revascularization procedures, left carotid surgery, CAD status post CABG, atrial fibrillation maintained on aspirin and Plavix. 08/22/2016 after a fall from a ladder after attempting to clean out his stove pipe.  CT abdomen and pelvis showed disruption of the symphysis pubis and left sacroiliac joint with retroperitoneal hemorrhage in the left side of the pelvis and subcutaneous hemorrhage in the left buttocks as well as fractures left transverse process of L2-S1. Also with incidental findings of spiculated mass in the right middle lobe consistent with carcinoma of the lung with metastatic adenopathy in the subcarinal region. Underwent open reduction internal fixation of pubic symphysis with sacroiliac screw fixation of left SI joint 08/25/2016  Pt reports his appetite has been improving. Meal completion has been 50-100% with 100% at lunch today. Pt reports eating well PTA with usual consumption of at least 3 meals a day with no other difficulties. No recent weight recorded, thus pt was weighed on bed scale which revealed 164 lbs. Pt and wife at bedside reports weight has been fluctuating due to fluid status. Usual body weight reported to be ~158 lbs. Pt currently has Prostat ordered and has been consuming them however reports disliking the taste and is agreeable to alternative supplements. RD  to modify orders.   Nutrition-Focused physical exam completed. Findings are moderate fat depletion, no muscle depletion, and mild edema.   Labs and medications reviewed.   Diet Order:  Diet Heart Room service appropriate? Yes; Fluid consistency: Thin  Skin:  Wound (see comment) (Incision on abdomen and L hip)  Last BM:  3/22  Height:   Ht Readings from Last 1 Encounters:  08/31/16 '5\' 6"'$  (1.676 m)    Weight:   Wt Readings from Last 1 Encounters:  08/31/16 185 lb 6.4 oz (84.1 kg)  09/08/16 164 lb (bed scale)  Ideal Body Weight:  59 kg  BMI:  Body mass index is 29.92 kg/m.  Estimated Nutritional Needs:   Kcal:  1800-2000  Protein:  85-95 gramss  Fluid:  1.8 - 2 L/day  EDUCATION NEEDS:   No education needs identified at this time  Corrin Parker, MS, RD, LDN Pager # [phone] After hours/ weekend pager # [phone]

## 2016-09-08 NOTE — Progress Notes (Signed)
Pharmacy Antibiotic Note  [name] [name] is a 76 y.o. male admitted on 08/31/2016 with pneumonia.  Pharmacy has been consulted for Vancomycin dosing.  Vancomycin trough is elevated at 23 (goal 15-20 mcg/mL). Day # 6 of vancomycin. WBC wnl, afebrile. SCr is trending up and estimated CrCl ~ 50 mL/min.  Accumulation likely due to increasing SCr in setting of AKI.   Plan: Reduce vancomycin to 1250 mg IV every 24 hours for goal trough 15.  Monitor renal function closely Follow-up stop dates as tomorrow will be day # 7 of vancomycin.  Height: '5\' 6"'$  (167.6 cm) Weight: 185 lb 6.4 oz (84.1 kg) IBW/kg (Calculated) : 63.8  Temp (24hrs), Avg:97.9 F (36.6 C), Min:97.8 F (36.6 C), Max:97.9 F (36.6 C)   Recent Labs Lab 09/03/16 0119  09/05/16 0552 09/05/16 1013 09/05/16 1925 09/06/16 0304 09/07/16 0445 09/08/16 0545 09/08/16 1550 09/08/16 2040  WBC 26.9*  --  8.9 9.1  --   --   --  6.0 6.9  --   CREATININE 1.15  < > 0.84  --   --  0.80 0.88 1.03 1.23  --   VANCOTROUGH  --   --   --   --  17  --   --   --   --  23*  < > = values in this interval not displayed.  Estimated Creatinine Clearance: 50.3 mL/min (by C-G formula based on SCr of 1.23 mg/dL).    Antimicrobials this admission:  3/16 CTX >> 3/17 vanc>>  Dose adjustments this admission:  3/17 vanc '1000mg'$  q12h >> 750 mg q12 3/19: Vanc trough 17>>no changes 3/22 Vanc trough 23 >> reduce dose to  Microbiology results:  3/5 MRSA PCR: neg 3/16 UCx: enterobacter>sensitive to ceftriaxone   Sloan Leiter, PharmD, BCPS Clinical Pharmacist Clinical Phone 09/08/2016 until 11 PM - #[phone] After hours, please call #[phone] 09/08/2016 9:43 PM

## 2016-09-08 NOTE — Progress Notes (Signed)
Patient ID: [name] [name], male   DOB: 15-Jun-1941, 76 y.o.   MRN: [id number]      [address]       [place] [address]             [phone]                     LOS: 8 days   Subjective: Patient seenlast week  by Dr Roxan Hockey for consideration of bronchoscopy and EBUS to obtain tissue diagnosis from mediastinal adenopathy. When the patient originally fell from the ladder CT of the head suggested metastatic disease. Subsequent MRI shows this to be less likely. He is progressing in rehab.   Objective: Vital signs in last 24 hours: Patient Vitals for the past 24 hrs:  BP Temp Temp src Pulse Resp SpO2  09/08/16 0500 (!) 133/54 97.9 F (36.6 C) Oral (!) 56 18 96 %    Filed Weights   08/31/16 1749  Weight: 185 lb 6.4 oz (84.1 kg)    Hemodynamic parameters for last 24 hours:    Intake/Output from previous day: 03/21 0701 - 03/22 0700 In: 660 [P.O.:660] Out: 3600 [Urine:3600] Intake/Output this shift: Total I/O In: 460 [P.O.:460] Out: 1700 [Urine:1700]  Scheduled Meds: . bumetanide  1 mg Oral Daily  . cefTRIAXone (ROCEPHIN)  IV  1 g Intravenous Q24H  . enoxaparin (LOVENOX) injection  1 mg/kg Subcutaneous Q12H  . feeding supplement (PRO-STAT SUGAR FREE 64)  30 mL Oral BID  . gabapentin  100 mg Oral QHS  . levothyroxine  112 mcg Oral QAC breakfast  . linaclotide  145 mcg Oral QAC breakfast  . metoprolol  12.5 mg Oral BID  . nystatin   Topical BID  . pantoprazole  40 mg Oral Daily  . polyethylene glycol  17 g Oral BID  . potassium chloride  20 mEq Oral Daily  . rosuvastatin  20 mg Oral q1800  . senna-docusate  2 tablet Oral BID  . tamsulosin  0.4 mg Oral Daily  . triamterene-hydrochlorothiazide  1 tablet Oral Daily  . vancomycin  750 mg Intravenous Q12H   Continuous Infusions: PRN Meds:.acetaminophen, acetaminophen, ALPRAZolam, bisacodyl, nitroGLYCERIN, ondansetron **OR** ondansetron (ZOFRAN) IV, oxyCODONE, oxyCODONE-acetaminophen, sorbitol,  traZODone  General appearance: alert, cooperative and appears older than stated age Neurologic: intact Heart: regular rate and rhythm, S1, S2 normal, no murmur, click, rub or gallop Lungs: diminished breath sounds bibasilar Abdomen: soft, non-tender; bowel sounds normal; no masses,  no organomegaly Extremities: Significant bruising along the flanks and hip  Lab Results: CBC:  Recent Labs  09/08/16 0545  WBC 6.0  HGB 10.2*  HCT 31.0*  PLT 369   BMET:   Recent Labs  09/07/16 0445 09/08/16 0545  NA 133* 132*  K 3.7 4.0  CL 98* 95*  CO2 25 26  GLUCOSE 88 97  BUN 17 20  CREATININE 0.88 1.03  CALCIUM 8.4* 8.7*    PT/INR:   Recent Labs  09/08/16 0545  LABPROT 17.0*  INR 1.37     Radiology    Ct Cervical Spine Wo Contrast  Result Date: 08/22/2016 CLINICAL DATA:  76 year old male status post fall from ladder. Extensive pelvic fractures. Abnormal chest CT today revealing a spiculated right middle lobe lung mass. Initial encounter. EXAM: CT HEAD WITHOUT CONTRAST CT CERVICAL SPINE WITHOUT CONTRAST TECHNIQUE: Multidetector CT imaging of the head and cervical spine was performed following the standard protocol without intravenous contrast. Multiplanar CT image reconstructions of the cervical  spine were also generated. COMPARISON:  CT chest abdomen and pelvis from today reported separately. [facility] Brain MRI 07/13/2004. FINDINGS: CT HEAD FINDINGS Brain: Rounded mixed hyper and hypodense somewhat masslike area in the anterior left frontal lobe estimated at 2 cm. Adjacent increased white matter hypodensity in a configuration suggestive of mild vasogenic edema. See series 8, image 43. No significant regional mass effect. No second similar lesion identified elsewhere although there is a punctate perhaps dystrophic calcification in the right frontal lobe at the gray-white matter junction (series 4, image 26). No superimposed intracranial hemorrhage or mass effect. Basilar  cisterns are patent. Cavum septum pellucidum variant. No ventriculomegaly. Hypodensity in the right caudate nucleus compatible with chronic small vessel disease. No acute cortically based infarct identified. Vascular: Calcified atherosclerosis at the skull base. No suspicious intracranial vascular hyperdensity. Skull: No skull fracture identified. Bone mineralization appears within normal limits for age. Sinuses/Orbits: Mild to moderate anterior ethmoid and frontoethmoidal recess mucosal thickening. Other paranasal sinuses are clear. The left tympanic cavity and mastoids are clear. There is sclerosis of the right mastoids. There is asymmetric thickening of the right tympanic membrane. Other: Visualized orbit soft tissues are within normal limits. Mild left posterosuperior convexity scalp contusion or hematoma (series 6, image 76) with intact underlying calvarium. No other scout soft tissue injury identified. CT CERVICAL SPINE FINDINGS Alignment: Preserved cervical lordosis. Cervicothoracic junction alignment is within normal limits. There is trace anterolisthesis of C3 on C4 and C4 on C5 with associated chronic facet arthropathy. Bilateral posterior element alignment is within normal limits. Skull base and vertebrae: Visualized skull base is intact. No atlanto-occipital dissociation. No cervical spine fracture identified. There is heterogeneous bone mineralization in the cervical spine, including small lucent lesions in the right C4 (coronal image 31) and left C6 (coronal image 36) levels. Soft tissues and spinal canal: No prevertebral fluid or swelling. No visible canal hematoma. Ligamentous hypertrophy about the odontoid. Calcified carotid and vertebral artery atherosclerosis. Evidence of previous bilateral carotid endarterectomy. Extensive calcified proximal great vessel atherosclerosis at the visible thoracic inlet. Disc levels: Moderate to severe chronic facet degeneration greater on the right from the C3 to  the C6 level. Disc bulging. Multilevel ligament flavum hypertrophy suspected. Up to mild degenerative cervical spinal stenosis at C3-C4. Upper chest: Visible upper thoracic levels appear intact. Chest findings today are reported separately. IMPRESSION: 1. Abnormal anterior left frontal lobe, but the appearance does not resemble typical traumatic injury to the brain and is instead suspicious in this clinical setting for a 2 cm brain mass with mild surrounding vasogenic edema. Brain MRI without and with contrast when possible would characterize further. 2. No other acute intracranial abnormality is evident. Mild chronic small vessel disease in the right basal ganglia. 3.  No acute fracture or listhesis identified in the cervical spine. 4. Heterogeneous bone mineralization in the cervical spine could be degenerative in nature but early osseous metastatic disease is difficult to exclude. The head CT findings were reviewed in person with Dr. Doreen Salvage on 08/22/2016 at 1557 hours. Electronically Signed   By: Genevie Ann M.D.   On: 08/22/2016 16:23    Ct Abdomen Pelvis  And chest Result Date: 08/22/2016 CLINICAL DATA:  Multiple trauma. The patient fell 12 feet from a ladder. EXAM: CT CHEST, ABDOMEN, AND PELVIS WITH CONTRAST TECHNIQUE: Multidetector CT imaging of the chest, abdomen and pelvis was performed following the standard protocol during bolus administration of intravenous contrast. CONTRAST:  100 cc Omnipaque 300 COMPARISON:  Pelvic radiograph  dated 08/22/2016 and chest x-ray dated 08/22/2016 FINDINGS: CT CHEST FINDINGS Cardiovascular: Extensive aortic atherosclerosis. Extensive coronary artery calcifications. Prior CABG. Heart size is normal. Mediastinum/Nodes: 3.7 x 2.3 cm enlarged subcarinal lymph node. Thyroid gland, trachea, and esophagus demonstrate no significant findings. Lungs/Pleura: There is a spiculated 2.0 x 2.8 cm mass in the right middle lobe with soft tissue stranding to the periphery, consistent with a  primary carcinoma the lung. There is a vague 7 mm area of abnormal density in the posterior aspect of the left upper lobe on image 47 of series 3. There are 2 small nodular densities in the right upper lobe on image number 31 of series 3, 1 is 4 mm and the other is 2 mm. Musculoskeletal: No fractures or other acute abnormalities. CT ABDOMEN PELVIS FINDINGS Hepatobiliary: No focal liver abnormality is seen. Status post cholecystectomy. No biliary dilatation. Pancreas: Unremarkable. No pancreatic ductal dilatation or surrounding inflammatory changes. Spleen: No splenic injury or perisplenic hematoma. Adrenals/Urinary Tract: Adrenal glands are unremarkable. Kidneys are normal, without renal calculi, focal lesion, or hydronephrosis. There is retroperitoneal hemorrhage around the left side of the bladder. Tear is factor Maxwell findings no hemorrhage contrecoup never mind anterior to a diameter question MR plaques are Stomach/Bowel: Stomach is within normal limits. Appendix appears normal. No evidence of bowel wall thickening, distention, or inflammatory changes. Vascular/Lymphatic: There is retroperitoneal hemorrhage in the pelvis particularly adjacent to the left common iliac artery. I suspect the bleeding is from the left iliac vein. There is blood in the retroperitoneum anterior to the prostate gland and bladder. No appreciable free intraperitoneal hemorrhage. Hemorrhagic contusion in the left buttock. Reproductive: Slight enlargement of the prostate gland. Other: There is hemorrhage along the left iliopsoas muscle. Musculoskeletal: There is diastases of the symphysis pubis and of the left sacroiliac joint. There are fractures of the left transverse processes of L2 through S1. IMPRESSION: 1. Disruption of the symphysis pubis and left sacroiliac joint with retroperitoneal hemorrhage in the left side of the pelvis and subcutaneous hemorrhage in the left buttock. I suspect there is an injury to the left external iliac  vein. 2. Spiculated mass in the right middle lobe consistent with carcinoma the lung with metastatic adenopathy in the subcarinal region. 3. Several small pulmonary nodules which are too small to characterize but could represent metastases. 4. Fractures of the left transverse processes of L2 through S1. Electronically Signed   By: Lorriane Shire M.D.   On: 08/22/2016 16:19    Mr Jeri Cos VV Contrast  Result Date: 09/02/2016 CLINICAL DATA:  76 y/o  M; SRS protocol for metastatic lung cancer. EXAM: MRI HEAD WITHOUT AND WITH CONTRAST TECHNIQUE: Multiplanar, multiecho pulse sequences of the brain and surrounding structures were obtained without and with intravenous contrast. CONTRAST:  10m MULTIHANCE GADOBENATE DIMEGLUMINE 529 MG/ML IV SOLN COMPARISON:  08/22/2016 CT of the head. FINDINGS: Brain: Left frontal lesion on CT demonstrates gyriform T1 shortening and susceptibility blooming as well as a small region of T2 FLAIR hyperintense signal abnormality in white matter, and no appreciable enhancement after administration of intravenous contrast. No additional focus of abnormal enhancement in the brain is identified. Mild motion degradation of postcontrast sequences. A diffusion weighted imaging there are several foci of diffusion restriction within the right frontal and parietal lobe in a "Border zone" distribution. There is a small subdural collection over the left cerebral convexity with incomplete FLAIR suppression, T1 shortening, and diffusion restriction measuring up to 3 mm in thickness compatible with a subdural  hematoma. No significant mass effect on the brain. This is not appreciable on the prior CT of the head and may represent interval hemorrhage. No hydrocephalus. Cavum septum pellucidum. Few punctate foci of susceptibility in the right frontal lobe, right parietal lobe, and right cerebellar hemisphere are compatible with hemosiderin deposition of old microhemorrhage. Vascular: Normal flow voids. Skull  and upper cervical spine: Normal marrow signal. Sinuses/Orbits: Negative. Other: None. IMPRESSION: 1. Left frontal lesion on CT demonstrate gyriform T1 shortening and susceptibility blooming without appreciable enhancement on MRI. Findings favor a subacute to chronic hemorrhagic infarct or cortical contusion. Follow-up MRI is recommended with contrast to exclude underlying hemorrhagic metastasis. 2. No definite enhancing lesion of the brain identified. Moderate motion degradation of postcontrast sequences. 3. Small foci of diffusion restriction in the right frontal and parietal lobes in "border zone" distribution compatible with acute/early subacute infarction. The pattern suggests hypoxia/hypoperfusion. Sequelae of trauma with shear injury is also possible, although no associate hemorrhage is identified. 4. Small subdural hematoma over the left cerebral convexity without significant mass effect, not appreciable on prior CT, may represent interval hemorrhage. These results will be called to the ordering clinician or representative by the Radiologist Assistant, and communication documented in the PACS or zVision Dashboard. Electronically Signed   By: Kristine Garbe M.D.   On: 09/02/2016 18:13   Dg Chest Port 1 View  Result Date: 09/02/2016 CLINICAL DATA:  Shortness of Breath EXAM: PORTABLE CHEST 1 VIEW COMPARISON:  August 22, 2016 chest radiograph and chest CT FINDINGS: There is patchy airspace opacity in each lower lobe, likely multifocal pneumonia. There is cardiomegaly with pulmonary venous hypertension. No adenopathy. There is aortic atherosclerosis. Patient is status post coronary artery bypass grafting. No evident bone lesions. IMPRESSION: Pulmonary vascular congestion. Probable patchy pneumonia in each lung base. There appears to be slight increased opacity in these areas compared to recent chest radiograph. Cardiac silhouette is stable. There is aortic atherosclerosis. Electronically Signed   By:  Lowella Grip III M.D.   On: 09/02/2016 11:38    Assessment/Plan: Status post recent traumatic injury from falling from a ladder, CT scan does suggest lung cancer with mediastinal involvement. The MRI of the brain is less suggestive of brain metastasis than originally thought on the CT of the head. I discussed with the patient his wife and son bronchoscopy and ebus . He is agreeable with proceeding . Risks and options have been explained .  The goals risks and alternatives of the planned surgical procedure  Bronchoscopy and EBUS have been discussed with the patient in detail. The risks of the procedure including death, infection, stroke, myocardial infarction, bleeding, blood transfusion have all been discussed specifically.  I have quoted [name] [name] a 1% of perioperative mortality and a complication rate as high as 20 %. The patient's questions have been answered.[name] [name] is willing  to proceed with the planned procedure.  Grace Isaac MD 09/08/2016 2:06 PM    Patient ID: Amado Coe, male   DOB: [birth year], 76 y.o.   MRN: [id number]

## 2016-09-08 NOTE — Progress Notes (Signed)
Physical Therapy Session Note  Patient Details  Name: [name] [name] MRN: [id number] Date of Birth: 01-Sep-1940  Today's Date: 09/08/2016 PT Individual Time: 0830-0900 PT Individual Time Calculation (min): 30 min   Short Term Goals: Week 2:  PT Short Term Goal 1 (Week 2): = LTGs due to anticipated LOS  Skilled Therapeutic Interventions/Progress Updates:    Session focused on bed mobility with extra time and cues, squat pivot transfer to w/c with close supervision and cues for hand placement and technique but demonstrating good technique and clearance of bottom while maintaining weightbearing status, and w/c mobility through obstacle course (around cones and over compliant surface to simulate uneven terrain and furniture negotiation) and parts management (requires assist with donning of legrests). Cues for efficient propulsion technique and anterior weightshift when propelling uphill/uneven terrain. End of session left with wife present and chair alarm on.   Therapy Documentation Precautions:  Precautions Precautions: Fall Restrictions Weight Bearing Restrictions: Yes RLE Weight Bearing: Weight bearing as tolerated LLE Weight Bearing: Non weight bearing Other Position/Activity Restrictions: bed to chair only for 8 weeks; WBAT RLE for transfers only   Pain: Premedicated. No current complaints.    See Function Navigator for Current Functional Status.   Therapy/Group: Individual Therapy  [name], PT, DPT  09/08/2016, 10:06 AM

## 2016-09-08 NOTE — Progress Notes (Signed)
Occupational Therapy Weekly Progress Note  Patient Details  Name: [name] [name] MRN: [id number] Date of Birth: 04/28/1941  Beginning of progress report period: September 01, 2016 End of progress report period: September 08, 2016  Today's Date: 09/08/2016 OT Individual Time: 1330-1415 OT Individual Time Calculation (min): 45 min    Patient has met 3 of 7 long term goals.  Short term goals not set due to estimated length of stay.  Patient is making steady progress with OT. He is able to complete transfers with overall min A and is progressing with lower body ADL tasks, requiring overall Mod A. Bathing and toilet transfer goals have been upgraded 2/2 progress. Continue current plan.  Patient continues to demonstrate the following deficits: muscle weakness and decreased standing balance, decreased balance strategies and difficulty maintaining precautions and therefore will continue to benefit from skilled OT intervention to enhance overall performance with BADL.  See Patient's Care Plan for progression toward long term goals.  Patient progressing toward long term goals..  Continue plan of care.  Skilled Therapeutic Interventions/Progress Updates:   1:1 OT treatment session focused on L UE fine motor coordination/strength and transfer training. Pink medium theraputty with focus on palmer translation, pinch strength, and grip strength. Pt required mod cues to follow directions. 3 sets of 10 w/c pushups with mod instructional cues to follow directions for UE and LE positioning. Pt left seated in wc with needs met.   Therapy Documentation Precautions:  Precautions Precautions: Fall Restrictions Weight Bearing Restrictions: Yes RLE Weight Bearing: Weight bearing as tolerated LLE Weight Bearing: Non weight bearing Other Position/Activity Restrictions: bed to chair only for 8 weeks; WBAT RLE for transfers only Pain:  denies pain ADL: ADL ADL Comments: refer to functional navigator  See Function  Navigator for Current Functional Status.   Therapy/Group: Individual Therapy  [name] 09/08/2016, 1:50 PM

## 2016-09-08 NOTE — Significant Event (Signed)
CRITICAL VALUE ALERT  Critical value received:  Vancomycin trough  Date of notification:  09/08/2016  Time of notification:  2135  Critical value read back:Yes.    Nurse who received alert:  Renato Gails, RN  MD notified (1st page):  Silvestre Mesi, PA  Time of first page:  2155  MD notified (2nd page):  Time of second page:  Responding MD:  Silvestre Mesi  Time MD responded:  2155

## 2016-09-08 NOTE — Progress Notes (Signed)
Physical Therapy Session Note  Patient Details  Name: [name] [name] MRN: [id number] Date of Birth: 19-Mar-1941  Today's Date: 09/08/2016 PT Individual Time: 1300-1327 and 1450-1558 PT Individual Time Calculation (min): 27 min and 68 min  Short Term Goals: Week 2:  PT Short Term Goal 1 (Week 2): = LTGs due to anticipated LOS  Skilled Therapeutic Interventions/Progress Updates:   Treatment 1: Patient in bed upon arrival, reporting feeling better after episode of diarrhea and nausea limiting ability to participate at scheduled therapy time this AM. Patient transferred to EOB using rail with HOB raised and use of leg lifter with supervision and extra time and transferred from bed to wheelchair via squat pivot with supervision and no cues for sequencing or technique. Remainder of session focused on community wheelchair propulsion using BUE from room to and from hospital gift shop including on/off elevators and on carpet and tile surfaces with supervision and increased time and verbal cues for efficiency of technique and navigating narrow spaces. Patient tolerated well and left sitting in wheelchair with all needs in reach to await OT session.  Treatment 2: Patient sitting in wheelchair, fatigued but agreeable to therapy. Instructed in wheelchair pushup for pressure relief, 2 x 30 with partial sit <> stand from wheelchair using RLE. Patient propelled wheelchair 2 x 125 ft with supervision and increased time. With max verbal/visual cues, patient able to complete wheelchair setup with increased time including doffing B elevating leg rests, locking brakes, and removing armrest in preparation for transfer. Patient performed lateral scoot transfer from wheelchair > mat table with max verbal cues for hand placement, head hips relationship, and overall sequencing with supervision. Sit <> stand transfers from mat table using RW with LUE on walker and pushing up/reaching back with RUE with initial min-mod A  progressed to supervision, able to maintain NWB LLE without cues. Standing BLE therex: L hip flexion, R heel raises and seated BLE therex: heel/toe raises, LAQ, hip flexion, and isometric hip adduction. Seated EOM, patient used 1# dowel rod in BUE to hit beach ball back and forth x 2 trials to fatigue for strengthening and cardiovascular endurance. Patient requested to return to room due to groin burning/discomfort. Patient performed stand pivot transfer using RW from mat table to wheelchair to hospital bed with steady assist and initial demonstration with mod verbal cues for sequencing and technique. Patient scooted to [facility] and transferred sit > supine with increased time and use of leg lifter with supervision. Patient left semi reclined in bed with all needs in reach and family present.   Therapy Documentation Precautions:  Precautions Precautions: Fall Restrictions Weight Bearing Restrictions: Yes RLE Weight Bearing: Weight bearing as tolerated LLE Weight Bearing: Non weight bearing Other Position/Activity Restrictions: bed to chair only for 8 weeks; WBAT RLE for transfers only Pain:  Unrated burning pain in groin, provided rest  See Function Navigator for Current Functional Status.   Therapy/Group: Individual Therapy  Anetha Slagel, Murray Hodgkins 09/08/2016, 1:29 PM

## 2016-09-08 NOTE — Progress Notes (Signed)
Radiation Oncology         [phone] ________________________________  Name: [name] [name] MRN: [id number]  Date: 08/31/2016  DOB: 06/14/1941  Progress Note-Follow up  CC: Mickie Hillier, MD  No ref. provider found  Diagnosis:   Putative stage IV ([id number]) lung cancer with suspected metastasis to the brain.  Narrative:    [name] [name] is a 76 y.o. male seen at the request of Dr. Earlie Server for newly diagnosed putative stage IV lung cancer with suspected metastasis to the brain. He was admitted to the hospital on 08/22/2016 after sustaining a 10 foot fall from a ladder while attempting to clean out his stove pipe on his roof at home. The patient sustained an open book pelvic fracture as well as fractures of the left transverse processes of L2-S1 as a result of his fall. CT Head obtained on admission failed to demonstrate any acute trauma but did reveal a 2 cm mass in the anterior left frontal lobe with mild surrounding vasogenic edema. No other acute intracranial abnormalities were noted. The patient also had a CT Chest with contrast which demonstrated a spiculated 2.0 x 2.8 cm mass in the right middle lobe consistent with a primary carcinoma of the lung with metastatic adenopathy in the subcarinal region (subcarinal lymph node measures 3.7 x 2.3). There were several other small, bilateral pulmonary nodules which were too small to characterize.   The patient underwent open reduction and internal fixation of pubic symphysis fracture with sacroiliac screw fixation of the left SI joint on 08/25/2016. He has recently transferred to the inpatient rehabilitation center and remains nonweightbearing on the left lower extremity with weight-bearing as tolerated on the right lower extremity for transfer only from bed to chair currently. During the course of his hospitalization, the patient developed bilateral lower extremity DVTs which are being managed with Lovenox daily and has also been treated for  pneumonia.   MRI Brain with SRS protocol was performed on 09/02/16 for further evaluation of the left frontal lobe mass noted on recent CT.  This revealed NO definite enhancing lesion of the brain with findings favoring a subacute to chronic hemorrhagic infarct or cortical contusion.                      On review of systems, the patient remains without complaints of chest pain, shortness of breath or dyspnea. He denies having any of the symptoms at home prior to his fall.  He also denies headaches, visual changes, and balance or gait disturbance. Prior to this admission, he reports being extremely active, going to the gym 5 days a week and his wife concurs.     ALLERGIES:  is allergic to imitrex [sumatriptan] and lipitor [atorvastatin].  Meds: Current Facility-Administered Medications  Medication Dose Route Frequency Provider Last Rate Last Dose  . acetaminophen (TYLENOL) suppository 650 mg  650 mg Rectal Q4H PRN Ankit Lorie Phenix, MD   650 mg at 09/02/16 2155  . acetaminophen (TYLENOL) tablet 650 mg  650 mg Oral Q6H PRN Lavon Paganini Angiulli, PA-C      . ALPRAZolam Duanne Moron) tablet 0.25 mg  0.25 mg Oral TID PRN Tilda Franco, MD   0.25 mg at 09/07/16 0131  . bisacodyl (DULCOLAX) suppository 10 mg  10 mg Rectal Daily PRN Lavon Paganini Angiulli, PA-C   10 mg at 09/05/16 1503  . bumetanide (BUMEX) tablet 1 mg  1 mg Oral Daily Ankit Lorie Phenix, MD   1 mg at 09/08/16  8756  . cefTRIAXone (ROCEPHIN) 1 g in dextrose 5 % 50 mL IVPB  1 g Intravenous Q24H Ankit Lorie Phenix, MD   1 g at 09/07/16 1740  . feeding supplement (ENSURE ENLIVE) (ENSURE ENLIVE) liquid 237 mL  237 mL Oral BID BM Ankit Lorie Phenix, MD      . gabapentin (NEURONTIN) capsule 100 mg  100 mg Oral QHS Ankit Lorie Phenix, MD   100 mg at 09/07/16 2248  . levothyroxine (SYNTHROID, LEVOTHROID) tablet 112 mcg  112 mcg Oral QAC breakfast Lavon Paganini Angiulli, PA-C   112 mcg at 09/08/16 0541  . linaclotide (LINZESS) capsule 145 mcg  145 mcg Oral QAC breakfast Lew Dawes V, MD   145 mcg at 09/08/16 0541  . metoprolol tartrate (LOPRESSOR) tablet 12.5 mg  12.5 mg Oral BID Ankit Lorie Phenix, MD   12.5 mg at 09/08/16 4332  . nitroGLYCERIN (NITROSTAT) SL tablet 0.4 mg  0.4 mg Sublingual Q5 min PRN Lavon Paganini Angiulli, PA-C      . nystatin (MYCOSTATIN/NYSTOP) topical powder   Topical BID Ankit Lorie Phenix, MD      . ondansetron [facility]) tablet 4 mg  4 mg Oral Q6H PRN Lavon Paganini Angiulli, PA-C       Or  . ondansetron (ZOFRAN) injection 4 mg  4 mg Intravenous Q6H PRN Lavon Paganini Angiulli, PA-C      . oxyCODONE (Oxy IR/ROXICODONE) immediate release tablet 5-10 mg  5-10 mg Oral Q6H PRN Lavon Paganini Angiulli, PA-C   10 mg at 09/08/16 0542  . oxyCODONE-acetaminophen (PERCOCET/ROXICET) 5-325 MG per tablet 1-2 tablet  1-2 tablet Oral Q6H PRN Cathlyn Parsons, PA-C   1 tablet at 09/07/16 2047  . pantoprazole (PROTONIX) EC tablet 40 mg  40 mg Oral Daily Lavon Paganini Angiulli, PA-C   40 mg at 09/08/16 9518  . polyethylene glycol (MIRALAX / GLYCOLAX) packet 17 g  17 g Oral BID Ankit Lorie Phenix, MD   17 g at 09/08/16 0819  . potassium chloride SA (K-DUR,KLOR-CON) CR tablet 20 mEq  20 mEq Oral Daily Ankit Lorie Phenix, MD   20 mEq at 09/08/16 0819  . rosuvastatin (CRESTOR) tablet 20 mg  20 mg Oral q1800 Lavon Paganini Angiulli, PA-C   20 mg at 09/07/16 1741  . senna-docusate (Senokot-S) tablet 2 tablet  2 tablet Oral BID Ankit Lorie Phenix, MD   2 tablet at 09/08/16 [phone]  . sorbitol 70 % solution 30 mL  30 mL Oral Daily PRN Lavon Paganini Angiulli, PA-C   30 mL at 09/06/16 0748  . tamsulosin (FLOMAX) capsule 0.4 mg  0.4 mg Oral Daily Lavon Paganini Angiulli, PA-C   0.4 mg at 09/08/16 6063  . traZODone (DESYREL) tablet 25 mg  25 mg Oral QHS PRN Ankit Lorie Phenix, MD   25 mg at 09/01/16 1954  . triamterene-hydrochlorothiazide (MAXZIDE-25) 37.5-25 MG per tablet 1 tablet  1 tablet Oral Daily Lavon Paganini Angiulli, PA-C   1 tablet at 09/08/16 0160  . vancomycin (VANCOCIN) IVPB 750 mg/150 ml premix  750 mg Intravenous Q12H  Ankit Lorie Phenix, MD   750 mg at 09/08/16 1093    Physical Findings:  height is '5\' 6"'$  (1.676 m) and weight is 185 lb 6.4 oz (84.1 kg). His oral temperature is 97.8 F (36.6 C). His blood pressure is 110/40 (abnormal) and his pulse is 61. His respiration is 20 and oxygen saturation is 98%.  Pain Assessment Pain Score: 0-No pain/10 In general this is a well  appearing caucasian male in no acute distress. He's alert and oriented x4 and appropriate throughout the examination. Cardiopulmonary assessment is negative for acute distress and he exhibits normal effort.   Lab Findings: Lab Results  Component Value Date   WBC 6.0 09/08/2016   HGB 10.2 (L) 09/08/2016   HCT 31.0 (L) 09/08/2016   MCV 89.9 09/08/2016   PLT 369 09/08/2016     Radiographic Findings: Dg Chest 2 View  Result Date: 09/06/2016 CLINICAL DATA:  Pneumonia. EXAM: CHEST  2 VIEW COMPARISON:  Radiograph of September 02, 2016. FINDINGS: Stable cardiomediastinal silhouette. Atherosclerosis of thoracic aorta is noted. Status post coronary artery bypass graft. No pneumothorax is noted. Mild bilateral pleural effusions are noted. Stable bilateral perihilar and basilar interstitial densities are noted concerning for edema or inflammation. Bony thorax is unremarkable. IMPRESSION: Aortic atherosclerosis. Mild bilateral pleural effusions. Stable bilateral pulmonary edema or inflammation. Electronically Signed   By: Marijo Conception, M.D.   On: 09/06/2016 07:56   Ct Head Wo Contrast  Result Date: 08/22/2016 CLINICAL DATA:  76 year old male status post fall from ladder. Extensive pelvic fractures. Abnormal chest CT today revealing a spiculated right middle lobe lung mass. Initial encounter. EXAM: CT HEAD WITHOUT CONTRAST CT CERVICAL SPINE WITHOUT CONTRAST TECHNIQUE: Multidetector CT imaging of the head and cervical spine was performed following the standard protocol without intravenous contrast. Multiplanar CT image reconstructions of the cervical  spine were also generated. COMPARISON:  CT chest abdomen and pelvis from today reported separately. [facility] Brain MRI 07/13/2004. FINDINGS: CT HEAD FINDINGS Brain: Rounded mixed hyper and hypodense somewhat masslike area in the anterior left frontal lobe estimated at 2 cm. Adjacent increased white matter hypodensity in a configuration suggestive of mild vasogenic edema. See series 8, image 43. No significant regional mass effect. No second similar lesion identified elsewhere although there is a punctate perhaps dystrophic calcification in the right frontal lobe at the gray-white matter junction (series 4, image 26). No superimposed intracranial hemorrhage or mass effect. Basilar cisterns are patent. Cavum septum pellucidum variant. No ventriculomegaly. Hypodensity in the right caudate nucleus compatible with chronic small vessel disease. No acute cortically based infarct identified. Vascular: Calcified atherosclerosis at the skull base. No suspicious intracranial vascular hyperdensity. Skull: No skull fracture identified. Bone mineralization appears within normal limits for age. Sinuses/Orbits: Mild to moderate anterior ethmoid and frontoethmoidal recess mucosal thickening. Other paranasal sinuses are clear. The left tympanic cavity and mastoids are clear. There is sclerosis of the right mastoids. There is asymmetric thickening of the right tympanic membrane. Other: Visualized orbit soft tissues are within normal limits. Mild left posterosuperior convexity scalp contusion or hematoma (series 6, image 76) with intact underlying calvarium. No other scout soft tissue injury identified. CT CERVICAL SPINE FINDINGS Alignment: Preserved cervical lordosis. Cervicothoracic junction alignment is within normal limits. There is trace anterolisthesis of C3 on C4 and C4 on C5 with associated chronic facet arthropathy. Bilateral posterior element alignment is within normal limits. Skull base and vertebrae: Visualized  skull base is intact. No atlanto-occipital dissociation. No cervical spine fracture identified. There is heterogeneous bone mineralization in the cervical spine, including small lucent lesions in the right C4 (coronal image 31) and left C6 (coronal image 36) levels. Soft tissues and spinal canal: No prevertebral fluid or swelling. No visible canal hematoma. Ligamentous hypertrophy about the odontoid. Calcified carotid and vertebral artery atherosclerosis. Evidence of previous bilateral carotid endarterectomy. Extensive calcified proximal great vessel atherosclerosis at the visible thoracic inlet. Disc levels: Moderate to severe  chronic facet degeneration greater on the right from the C3 to the C6 level. Disc bulging. Multilevel ligament flavum hypertrophy suspected. Up to mild degenerative cervical spinal stenosis at C3-C4. Upper chest: Visible upper thoracic levels appear intact. Chest findings today are reported separately. IMPRESSION: 1. Abnormal anterior left frontal lobe, but the appearance does not resemble typical traumatic injury to the brain and is instead suspicious in this clinical setting for a 2 cm brain mass with mild surrounding vasogenic edema. Brain MRI without and with contrast when possible would characterize further. 2. No other acute intracranial abnormality is evident. Mild chronic small vessel disease in the right basal ganglia. 3.  No acute fracture or listhesis identified in the cervical spine. 4. Heterogeneous bone mineralization in the cervical spine could be degenerative in nature but early osseous metastatic disease is difficult to exclude. The head CT findings were reviewed in person with Dr. Doreen Salvage on 08/22/2016 at 1557 hours. Electronically Signed   By: Genevie Ann M.D.   On: 08/22/2016 16:23   Ct Chest W Contrast  Result Date: 08/22/2016 CLINICAL DATA:  Multiple trauma. The patient fell 12 feet from a ladder. EXAM: CT CHEST, ABDOMEN, AND PELVIS WITH CONTRAST TECHNIQUE: Multidetector  CT imaging of the chest, abdomen and pelvis was performed following the standard protocol during bolus administration of intravenous contrast. CONTRAST:  100 cc Omnipaque 300 COMPARISON:  Pelvic radiograph dated 08/22/2016 and chest x-ray dated 08/22/2016 FINDINGS: CT CHEST FINDINGS Cardiovascular: Extensive aortic atherosclerosis. Extensive coronary artery calcifications. Prior CABG. Heart size is normal. Mediastinum/Nodes: 3.7 x 2.3 cm enlarged subcarinal lymph node. Thyroid gland, trachea, and esophagus demonstrate no significant findings. Lungs/Pleura: There is a spiculated 2.0 x 2.8 cm mass in the right middle lobe with soft tissue stranding to the periphery, consistent with a primary carcinoma the lung. There is a vague 7 mm area of abnormal density in the posterior aspect of the left upper lobe on image 47 of series 3. There are 2 small nodular densities in the right upper lobe on image number 31 of series 3, 1 is 4 mm and the other is 2 mm. Musculoskeletal: No fractures or other acute abnormalities. CT ABDOMEN PELVIS FINDINGS Hepatobiliary: No focal liver abnormality is seen. Status post cholecystectomy. No biliary dilatation. Pancreas: Unremarkable. No pancreatic ductal dilatation or surrounding inflammatory changes. Spleen: No splenic injury or perisplenic hematoma. Adrenals/Urinary Tract: Adrenal glands are unremarkable. Kidneys are normal, without renal calculi, focal lesion, or hydronephrosis. There is retroperitoneal hemorrhage around the left side of the bladder. Tear is factor Maxwell findings no hemorrhage contrecoup never mind anterior to a diameter question MR plaques are Stomach/Bowel: Stomach is within normal limits. Appendix appears normal. No evidence of bowel wall thickening, distention, or inflammatory changes. Vascular/Lymphatic: There is retroperitoneal hemorrhage in the pelvis particularly adjacent to the left common iliac artery. I suspect the bleeding is from the left iliac vein. There  is blood in the retroperitoneum anterior to the prostate gland and bladder. No appreciable free intraperitoneal hemorrhage. Hemorrhagic contusion in the left buttock. Reproductive: Slight enlargement of the prostate gland. Other: There is hemorrhage along the left iliopsoas muscle. Musculoskeletal: There is diastases of the symphysis pubis and of the left sacroiliac joint. There are fractures of the left transverse processes of L2 through S1. IMPRESSION: 1. Disruption of the symphysis pubis and left sacroiliac joint with retroperitoneal hemorrhage in the left side of the pelvis and subcutaneous hemorrhage in the left buttock. I suspect there is an injury to the left  external iliac vein. 2. Spiculated mass in the right middle lobe consistent with carcinoma the lung with metastatic adenopathy in the subcarinal region. 3. Several small pulmonary nodules which are too small to characterize but could represent metastases. 4. Fractures of the left transverse processes of L2 through S1. Electronically Signed   By: Lorriane Shire M.D.   On: 08/22/2016 16:19   Ct Cervical Spine Wo Contrast  Result Date: 08/22/2016 CLINICAL DATA:  76 year old male status post fall from ladder. Extensive pelvic fractures. Abnormal chest CT today revealing a spiculated right middle lobe lung mass. Initial encounter. EXAM: CT HEAD WITHOUT CONTRAST CT CERVICAL SPINE WITHOUT CONTRAST TECHNIQUE: Multidetector CT imaging of the head and cervical spine was performed following the standard protocol without intravenous contrast. Multiplanar CT image reconstructions of the cervical spine were also generated. COMPARISON:  CT chest abdomen and pelvis from today reported separately. [facility] Brain MRI 07/13/2004. FINDINGS: CT HEAD FINDINGS Brain: Rounded mixed hyper and hypodense somewhat masslike area in the anterior left frontal lobe estimated at 2 cm. Adjacent increased white matter hypodensity in a configuration suggestive of mild  vasogenic edema. See series 8, image 43. No significant regional mass effect. No second similar lesion identified elsewhere although there is a punctate perhaps dystrophic calcification in the right frontal lobe at the gray-white matter junction (series 4, image 26). No superimposed intracranial hemorrhage or mass effect. Basilar cisterns are patent. Cavum septum pellucidum variant. No ventriculomegaly. Hypodensity in the right caudate nucleus compatible with chronic small vessel disease. No acute cortically based infarct identified. Vascular: Calcified atherosclerosis at the skull base. No suspicious intracranial vascular hyperdensity. Skull: No skull fracture identified. Bone mineralization appears within normal limits for age. Sinuses/Orbits: Mild to moderate anterior ethmoid and frontoethmoidal recess mucosal thickening. Other paranasal sinuses are clear. The left tympanic cavity and mastoids are clear. There is sclerosis of the right mastoids. There is asymmetric thickening of the right tympanic membrane. Other: Visualized orbit soft tissues are within normal limits. Mild left posterosuperior convexity scalp contusion or hematoma (series 6, image 76) with intact underlying calvarium. No other scout soft tissue injury identified. CT CERVICAL SPINE FINDINGS Alignment: Preserved cervical lordosis. Cervicothoracic junction alignment is within normal limits. There is trace anterolisthesis of C3 on C4 and C4 on C5 with associated chronic facet arthropathy. Bilateral posterior element alignment is within normal limits. Skull base and vertebrae: Visualized skull base is intact. No atlanto-occipital dissociation. No cervical spine fracture identified. There is heterogeneous bone mineralization in the cervical spine, including small lucent lesions in the right C4 (coronal image 31) and left C6 (coronal image 36) levels. Soft tissues and spinal canal: No prevertebral fluid or swelling. No visible canal hematoma.  Ligamentous hypertrophy about the odontoid. Calcified carotid and vertebral artery atherosclerosis. Evidence of previous bilateral carotid endarterectomy. Extensive calcified proximal great vessel atherosclerosis at the visible thoracic inlet. Disc levels: Moderate to severe chronic facet degeneration greater on the right from the C3 to the C6 level. Disc bulging. Multilevel ligament flavum hypertrophy suspected. Up to mild degenerative cervical spinal stenosis at C3-C4. Upper chest: Visible upper thoracic levels appear intact. Chest findings today are reported separately. IMPRESSION: 1. Abnormal anterior left frontal lobe, but the appearance does not resemble typical traumatic injury to the brain and is instead suspicious in this clinical setting for a 2 cm brain mass with mild surrounding vasogenic edema. Brain MRI without and with contrast when possible would characterize further. 2. No other acute intracranial abnormality is evident. Mild chronic small  vessel disease in the right basal ganglia. 3.  No acute fracture or listhesis identified in the cervical spine. 4. Heterogeneous bone mineralization in the cervical spine could be degenerative in nature but early osseous metastatic disease is difficult to exclude. The head CT findings were reviewed in person with Dr. Doreen Salvage on 08/22/2016 at 1557 hours. Electronically Signed   By: Genevie Ann M.D.   On: 08/22/2016 16:23   Mr Jeri Cos WU Contrast  Result Date: 09/02/2016 CLINICAL DATA:  76 y/o  M; SRS protocol for metastatic lung cancer. EXAM: MRI HEAD WITHOUT AND WITH CONTRAST TECHNIQUE: Multiplanar, multiecho pulse sequences of the brain and surrounding structures were obtained without and with intravenous contrast. CONTRAST:  40m MULTIHANCE GADOBENATE DIMEGLUMINE 529 MG/ML IV SOLN COMPARISON:  08/22/2016 CT of the head. FINDINGS: Brain: Left frontal lesion on CT demonstrates gyriform T1 shortening and susceptibility blooming as well as a small region of T2 FLAIR  hyperintense signal abnormality in white matter, and no appreciable enhancement after administration of intravenous contrast. No additional focus of abnormal enhancement in the brain is identified. Mild motion degradation of postcontrast sequences. A diffusion weighted imaging there are several foci of diffusion restriction within the right frontal and parietal lobe in a "Border zone" distribution. There is a small subdural collection over the left cerebral convexity with incomplete FLAIR suppression, T1 shortening, and diffusion restriction measuring up to 3 mm in thickness compatible with a subdural hematoma. No significant mass effect on the brain. This is not appreciable on the prior CT of the head and may represent interval hemorrhage. No hydrocephalus. Cavum septum pellucidum. Few punctate foci of susceptibility in the right frontal lobe, right parietal lobe, and right cerebellar hemisphere are compatible with hemosiderin deposition of old microhemorrhage. Vascular: Normal flow voids. Skull and upper cervical spine: Normal marrow signal. Sinuses/Orbits: Negative. Other: None. IMPRESSION: 1. Left frontal lesion on CT demonstrate gyriform T1 shortening and susceptibility blooming without appreciable enhancement on MRI. Findings favor a subacute to chronic hemorrhagic infarct or cortical contusion. Follow-up MRI is recommended with contrast to exclude underlying hemorrhagic metastasis. 2. No definite enhancing lesion of the brain identified. Moderate motion degradation of postcontrast sequences. 3. Small foci of diffusion restriction in the right frontal and parietal lobes in "border zone" distribution compatible with acute/early subacute infarction. The pattern suggests hypoxia/hypoperfusion. Sequelae of trauma with shear injury is also possible, although no associate hemorrhage is identified. 4. Small subdural hematoma over the left cerebral convexity without significant mass effect, not appreciable on prior  CT, may represent interval hemorrhage. These results will be called to the ordering clinician or representative by the Radiologist Assistant, and communication documented in the PACS or zVision Dashboard. Electronically Signed   By: LKristine GarbeM.D.   On: 09/02/2016 18:13   Ct Abdomen Pelvis W Contrast  Result Date: 08/22/2016 CLINICAL DATA:  Multiple trauma. The patient fell 12 feet from a ladder. EXAM: CT CHEST, ABDOMEN, AND PELVIS WITH CONTRAST TECHNIQUE: Multidetector CT imaging of the chest, abdomen and pelvis was performed following the standard protocol during bolus administration of intravenous contrast. CONTRAST:  100 cc Omnipaque 300 COMPARISON:  Pelvic radiograph dated 08/22/2016 and chest x-ray dated 08/22/2016 FINDINGS: CT CHEST FINDINGS Cardiovascular: Extensive aortic atherosclerosis. Extensive coronary artery calcifications. Prior CABG. Heart size is normal. Mediastinum/Nodes: 3.7 x 2.3 cm enlarged subcarinal lymph node. Thyroid gland, trachea, and esophagus demonstrate no significant findings. Lungs/Pleura: There is a spiculated 2.0 x 2.8 cm mass in the right middle lobe with soft  tissue stranding to the periphery, consistent with a primary carcinoma the lung. There is a vague 7 mm area of abnormal density in the posterior aspect of the left upper lobe on image 47 of series 3. There are 2 small nodular densities in the right upper lobe on image number 31 of series 3, 1 is 4 mm and the other is 2 mm. Musculoskeletal: No fractures or other acute abnormalities. CT ABDOMEN PELVIS FINDINGS Hepatobiliary: No focal liver abnormality is seen. Status post cholecystectomy. No biliary dilatation. Pancreas: Unremarkable. No pancreatic ductal dilatation or surrounding inflammatory changes. Spleen: No splenic injury or perisplenic hematoma. Adrenals/Urinary Tract: Adrenal glands are unremarkable. Kidneys are normal, without renal calculi, focal lesion, or hydronephrosis. There is retroperitoneal  hemorrhage around the left side of the bladder. Tear is factor Maxwell findings no hemorrhage contrecoup never mind anterior to a diameter question MR plaques are Stomach/Bowel: Stomach is within normal limits. Appendix appears normal. No evidence of bowel wall thickening, distention, or inflammatory changes. Vascular/Lymphatic: There is retroperitoneal hemorrhage in the pelvis particularly adjacent to the left common iliac artery. I suspect the bleeding is from the left iliac vein. There is blood in the retroperitoneum anterior to the prostate gland and bladder. No appreciable free intraperitoneal hemorrhage. Hemorrhagic contusion in the left buttock. Reproductive: Slight enlargement of the prostate gland. Other: There is hemorrhage along the left iliopsoas muscle. Musculoskeletal: There is diastases of the symphysis pubis and of the left sacroiliac joint. There are fractures of the left transverse processes of L2 through S1. IMPRESSION: 1. Disruption of the symphysis pubis and left sacroiliac joint with retroperitoneal hemorrhage in the left side of the pelvis and subcutaneous hemorrhage in the left buttock. I suspect there is an injury to the left external iliac vein. 2. Spiculated mass in the right middle lobe consistent with carcinoma the lung with metastatic adenopathy in the subcarinal region. 3. Several small pulmonary nodules which are too small to characterize but could represent metastases. 4. Fractures of the left transverse processes of L2 through S1. Electronically Signed   By: Lorriane Shire M.D.   On: 08/22/2016 16:19   Dg Pelvis Portable  Result Date: 08/25/2016 CLINICAL DATA:  Status post open reduction internal fixation of pelvic ring fracture. EXAM: PORTABLE PELVIS 1-2 VIEWS COMPARISON:  Radiographs of same day. FINDINGS: Status post surgical fixation of pubic symphysis. Also noted is surgical screw placement involving the left sacroiliac joint. No other fracture or dislocation is noted. Hip  joints appear normal. IMPRESSION: Status post surgical internal fixation of pubic symphysis and left sacroiliac joint. Electronically Signed   By: Marijo Conception, M.D.   On: 08/25/2016 11:45   Dg Pelvis Portable  Result Date: 08/22/2016 CLINICAL DATA:  Fall from roof.  Right flank and low back pain. EXAM: PORTABLE PELVIS 1-2 VIEWS COMPARISON:  07/13/2004 CT angiogram of the abdomen and pelvis. FINDINGS: There is abnormal widening at the pubic symphysis. There is mild 5 mm elevation of the right pubic bone relative to the left pubic bone at the pubic symphysis. No appreciable widening at the sacroiliac joints. Questionable vertical lucencies in the superior lateral sacral ala bilaterally. No evidence of hip dislocation on this single frontal view. No suspicious focal osseous lesion. Iliofemoral atherosclerosis bilaterally. IMPRESSION: 1. Mild diastasis and offset at the pubic symphysis suspicious for symphyseal disruption. 2. Questionable vertical lucencies in the superolateral sacral ala bilaterally, cannot exclude nondisplaced sacral fractures. No sacroiliac joint diastasis. 3. Recommend correlation with bony protocol pelvis CT. Electronically Signed  By: Ilona Sorrel M.D.   On: 08/22/2016 15:36   Dg Pelvis Comp Min 3v  Result Date: 08/25/2016 CLINICAL DATA:  ORIF pelvic fracture EXAM: JUDET PELVIS - 3+ VIEW; DG C-ARM 61-120 MIN COMPARISON:  None. FLUOROSCOPY TIME:  53 seconds FINDINGS: Six intraoperative fluoroscopic spot images are provided. Interval plate and screw ORIF of symphyseal diastases. Interval placement of a cannulated screw transfixing the left sacroiliac joint. IMPRESSION: 1. Interval ORIF of the pubic symphysis and left SI joint. Electronically Signed   By: Kathreen Devoid   On: 08/25/2016 10:04   Dg Chest Port 1 View  Result Date: 09/02/2016 CLINICAL DATA:  Shortness of Breath EXAM: PORTABLE CHEST 1 VIEW COMPARISON:  August 22, 2016 chest radiograph and chest CT FINDINGS: There is patchy  airspace opacity in each lower lobe, likely multifocal pneumonia. There is cardiomegaly with pulmonary venous hypertension. No adenopathy. There is aortic atherosclerosis. Patient is status post coronary artery bypass grafting. No evident bone lesions. IMPRESSION: Pulmonary vascular congestion. Probable patchy pneumonia in each lung base. There appears to be slight increased opacity in these areas compared to recent chest radiograph. Cardiac silhouette is stable. There is aortic atherosclerosis. Electronically Signed   By: Lowella Grip III M.D.   On: 09/02/2016 11:38   Dg Chest Port 1 View  Result Date: 08/22/2016 CLINICAL DATA:  Right flank and lower back pain after falling from a ladder 12 feet. EXAM: PORTABLE CHEST 1 VIEW COMPARISON:  Chest x-ray dated 07/17/2013 FINDINGS: The heart size and pulmonary vascularity are normal and the lungs are clear. CABG. Aortic atherosclerosis. No acute bone abnormality. IMPRESSION: No acute abnormalities.  CABG. Aortic atherosclerosis. Electronically Signed   By: Lorriane Shire M.D.   On: 08/22/2016 15:33   Dg C-arm 1-60 Min  Result Date: 08/25/2016 CLINICAL DATA:  ORIF pelvic fracture EXAM: JUDET PELVIS - 3+ VIEW; DG C-ARM 61-120 MIN COMPARISON:  None. FLUOROSCOPY TIME:  53 seconds FINDINGS: Six intraoperative fluoroscopic spot images are provided. Interval plate and screw ORIF of symphyseal diastases. Interval placement of a cannulated screw transfixing the left sacroiliac joint. IMPRESSION: 1. Interval ORIF of the pubic symphysis and left SI joint. Electronically Signed   By: Kathreen Devoid   On: 08/25/2016 10:04    Impression/Plan: 1. Putative stage IV ([id number]) lung cancer with suspected metastasis to the brain. Findings on recent MRI are much less suggestive of brain metastasis than originally thought based on CT of the head.  We will plan to repeat the 3T MRI Brain with SRS protocol in 4-6 weeks and plan follow up in the office as an outpatient to discuss  results. In the meantime, he is planning to undergo bronchoscopic biopsy of the right middle lobe lung lesion for tissue confirmation on 09/09/16 with Dr. Servando Snare.       Nicholos Johns, PA-C

## 2016-09-08 NOTE — Progress Notes (Signed)
[facility] PHYSICAL MEDICINE & REHABILITATION     PROGRESS NOTE  Subjective/Complaints:  Pt seen laying in bed this AM.  Wife at bedside.  Pt slept better overnight.  She has questions about day for biopsy.  ROS: Denies CP, SOB, N/V/D.  Objective: Vital Signs: Blood pressure (!) 133/54, pulse (!) 56, temperature 97.9 F (36.6 C), temperature source Oral, resp. rate 18, height '5\' 6"'$  (1.676 m), weight 84.1 kg (185 lb 6.4 oz), SpO2 96 %. No results found.  Recent Labs  09/05/16 1013 09/08/16 0545  WBC 9.1 6.0  HGB 9.5* 10.2*  HCT 28.8* 31.0*  PLT 341 369    Recent Labs  09/07/16 0445 09/08/16 0545  NA 133* 132*  K 3.7 4.0  CL 98* 95*  GLUCOSE 88 97  BUN 17 20  CREATININE 0.88 1.03  CALCIUM 8.4* 8.7*   CBG (last 3)  No results for input(s): GLUCAP in the last 72 hours.  Wt Readings from Last 3 Encounters:  08/31/16 84.1 kg (185 lb 6.4 oz)  08/22/16 71.7 kg (158 lb)  08/18/16 71.8 kg (158 lb 6 oz)    Physical Exam:  BP (!) 133/54 (BP Location: Left Arm)   Pulse (!) 56   Temp 97.9 F (36.6 C) (Oral)   Resp 18   Ht '5\' 6"'$  (1.676 m)   Wt 84.1 kg (185 lb 6.4 oz)   SpO2 96%   BMI 29.92 kg/m  Constitutional: He appears well-developed and well-nourished.  HENT: Normocephalic and atraumatic.  Eyes: EOMI. No discharge.  Cardiovascular: RRR. No JVD. +murmur. Respiratory: Effort normal. Clear GI: Soft. Bowel sounds are normal.  Musculoskeletal: He exhibits edema and tenderness.  GU: Scrotal edema.  Neurological: He is alert.  HOH Alert, confused Motor: RUE 4+/5 proximal to distal LUE: 4/5 proximal to distal RLE: 3-/5 proximal to distal LLE: 3/5 HF, 3+/5 KE, 4-/5 ADF/PF (slightly improved) Skin: Skin is warm and dry. Dressing to ORIF c/d/i Psychiatric: His mood appears anxious, improving.   Assessment/Plan: 1. Functional deficits secondary to closed pelvic ring fracture with retroperitoneal hemorrhage in the left pelvis s/p ORIF of SI after fall which require  3+ hours per day of interdisciplinary therapy in a comprehensive inpatient rehab setting. Physiatrist is providing close team supervision and 24 hour management of active medical problems listed below. Physiatrist and rehab team continue to assess barriers to discharge/monitor patient progress toward functional and medical goals.  Function:  Bathing Bathing position   Position: Sitting EOB  Bathing parts Body parts bathed by patient: Right arm, Left arm, Chest, Abdomen, Front perineal area, Right upper leg, Left upper leg Body parts bathed by helper: Buttocks, Right lower leg, Left lower leg, Back  Bathing assist        Upper Body Dressing/Undressing Upper body dressing   What is the patient wearing?: Pull over shirt/dress     Pull over shirt/dress - Perfomed by patient: Thread/unthread right sleeve, Thread/unthread left sleeve, Put head through opening, Pull shirt over trunk          Upper body assist Assist Level: Supervision or verbal cues      Lower Body Dressing/Undressing Lower body dressing   What is the patient wearing?: Pants, Non-skid slipper socks     Pants- Performed by patient: Thread/unthread right pants leg Pants- Performed by helper: Pull pants up/down, Thread/unthread left pants leg   Non-skid slipper socks- Performed by helper: Don/doff right sock, Don/doff left sock  Lower body assist Assist for lower body dressing: 2 Helpers      Toileting Toileting     Toileting steps completed by helper: Performs perineal hygiene, Adjust clothing after toileting, Adjust clothing prior to toileting    Toileting assist Assist level: Two helpers   Transfers Chair/bed transfer   Chair/bed transfer method: Lateral scoot Chair/bed transfer assist level: Touching or steadying assistance (Pt > 75%) Chair/bed transfer assistive device: Armrests     Locomotion Ambulation Ambulation activity did not occur: Safety/medical concerns (orders for  transfers only)         Wheelchair   Type: Manual Max wheelchair distance: 500 ft Assist Level: Supervision or verbal cues  Cognition Comprehension Comprehension assist level: Follows basic conversation/direction with no assist  Expression Expression assist level: Expresses basic needs/ideas: With no assist  Social Interaction Social Interaction assist level: Interacts appropriately 75 - 89% of the time - Needs redirection for appropriate language or to initiate interaction.  Problem Solving Problem solving assist level: Solves basic less than 25% of the time - needs direction nearly all the time or does not effectively solve problems and may need a restraint for safety  Memory Memory assist level: Recognizes or recalls 25 - 49% of the time/requires cueing 50 - 75% of the time    Medical Problem List and Plan: 1.  Decreased functional mobility secondary to Closed pelvic ring fracture with disorder of left sacroiliac joint with retroperitoneal hemorrhage in the left side of the pelvis with subcutaneous hemorrhage in the left buttocks after fall status post ORIF with sacroiliac screw fixation of left SI joint 08/25/2016. Weightbearing as tolerated right lower extremity for transfers only nonweightbearing left lower extremity  Cont CIR 2.  Acute DVT:   Subcutaneous Lovenox, holding coumadin until bronch  Vascular study with b/l peroneal and tibial veins  Monitor for any bleeding episodes 3. Pain Management: Oxycodone and Percocet as needed 4. Mood: Provide emotional support 5. Neuropsych: This patient is not capable of making decisions on his own behalf. 6. Skin/Wound Care: Routine skin checks  Nystatin ordered for perineal area, improved 7. Fluids/Electrolytes/Nutrition: Routine I&Os 8. Acute blood loss anemia.   Hb 10.2 on 3/22  Cont to monitor 9. Hypertension.   Lopressor decreased to 50 mg twice a day on 3/19, decreased to 37.5 BID on 3/20, decreased to 12.5 on 3/21  Maxzide  37.5.-25 milligrams daily  Improving 10. CAD status post CABG. No chest pain or shortness of breath. Plavix resumed.   On ASA, Coumadin on hold 11. Atrial fibrillation. Cardiac rate controlled. Continue Lopressor. 12. PVD with multiple revascularization procedures. Continue Plavix. 13. Right middle lobe lung/brain mass.    CTS consulted, bronch with ?plans for bx this week.  14. Scrotal edema with urinary retention  Failed voiding trial, Foley replaced  Bumex started 3/22  Continue Flomax 15. Hypothyroidism. Synthroid 16. Hyperlipidemia. Crestor 17. Constipation. Laxative assistance 18. Hyponatremia  Na+ 132 on 3/22  Cont to monitor 19. Hypokalemia  K+ 4.0 on 3/22  Supplementing  Cont to monitor 20. Hypoalbuminemia  Supplement initiated 3/15 21. Sleep disturbance  Trazodone PRN started 3/15 22. Leukocytosis: Resolved 23. Confusion: Improving  Multifactorial ?UTI +/- meds, +/- sleep +/- TBI 24. UTI  Cont abx 25. HCAP  Cont Vanc  CXR 3/20 reviewed, stable 26. Constipation    Increased meds  LOS (Days) 8 A FACE TO FACE EVALUATION WAS PERFORMED  Suda Forbess Lorie Phenix 09/08/2016 8:01 AM

## 2016-09-08 NOTE — Progress Notes (Signed)
ANTICOAGULATION CONSULT NOTE - Follow Up Consult  Pharmacy Consult for lovenox Indication: DVT  Allergies  Allergen Reactions  . Imitrex [Sumatriptan] Nausea And Vomiting and Other (See Comments)    "made me like I was having a stoke"  . Lipitor [Atorvastatin] Nausea And Vomiting and Other (See Comments)    INTOLERANCE > MYALGIAS "couldn't get out of the bed ( pt had to crawl out of bed ) I hurt [name] [name] bad"    Patient Measurements: Height: '5\' 6"'$  (167.6 cm) Weight: 185 lb 6.4 oz (84.1 kg) IBW/kg (Calculated) : 63.8 Heparin Dosing Weight:   Vital Signs: Temp: 97.9 F (36.6 C) (03/22 0500) Temp Source: Oral (03/22 0500) BP: 133/54 (03/22 0500) Pulse Rate: 56 (03/22 0500)  Labs:  Recent Labs  09/05/16 1013 09/06/16 0304 09/07/16 0445 09/08/16 0545  HGB 9.5*  --   --  10.2*  HCT 28.8*  --   --  31.0*  PLT 341  --   --  369  LABPROT  --  20.0* 19.4* 17.0*  INR  --  1.68 1.62 1.37  CREATININE  --  0.80 0.88 1.03    Estimated Creatinine Clearance: 60.1 mL/min (by C-G formula based on SCr of 1.03 mg/dL).   Medications:  Scheduled:  . bumetanide  1 mg Oral Daily  . cefTRIAXone (ROCEPHIN)  IV  1 g Intravenous Q24H  . enoxaparin (LOVENOX) injection  1 mg/kg Subcutaneous Q12H  . feeding supplement (PRO-STAT SUGAR FREE 64)  30 mL Oral BID  . gabapentin  100 mg Oral QHS  . levothyroxine  112 mcg Oral QAC breakfast  . linaclotide  145 mcg Oral QAC breakfast  . metoprolol  12.5 mg Oral BID  . nystatin   Topical BID  . pantoprazole  40 mg Oral Daily  . polyethylene glycol  17 g Oral BID  . potassium chloride  20 mEq Oral Daily  . rosuvastatin  20 mg Oral q1800  . senna-docusate  2 tablet Oral BID  . tamsulosin  0.4 mg Oral Daily  . triamterene-hydrochlorothiazide  1 tablet Oral Daily  . vancomycin  750 mg Intravenous Q12H   Infusions:    Assessment: 76 yo male with DVT is currently on treatment dose of lovenox.  Hgb 10.2 and Plt 369 K stable.   Goal of Therapy:   Anti-Xa level 0.6-1 units/ml 4hrs after LMWH dose given Monitor platelets by anticoagulation protocol: Yes   Plan:  -Continue Lovenox 85 mg SCq12h F/u resume of coumadin after potential procedure  [name] [name], Tsz-Yin 09/08/2016,9:13 AM

## 2016-09-09 ENCOUNTER — Encounter (HOSPITAL_COMMUNITY)
Admission: RE | Disposition: A | Discharge status: Home Health | Payer: Self-pay | Source: Intra-hospital | Attending: Physical Medicine & Rehabilitation

## 2016-09-09 ENCOUNTER — Ambulatory Visit (HOSPITAL_COMMUNITY): Admission: RE | Admit: 2016-09-09 | Payer: Medicare Other | Source: Ambulatory Visit | Admitting: Cardiothoracic Surgery

## 2016-09-09 ENCOUNTER — Inpatient Hospital Stay (HOSPITAL_COMMUNITY): Payer: Medicare Other | Admitting: Anesthesiology

## 2016-09-09 ENCOUNTER — Encounter (HOSPITAL_COMMUNITY): Payer: Self-pay | Admitting: Anesthesiology

## 2016-09-09 DIAGNOSIS — R590 Localized enlarged lymph nodes: Secondary | ICD-10-CM

## 2016-09-09 HISTORY — PX: VIDEO BRONCHOSCOPY WITH ENDOBRONCHIAL ULTRASOUND: SHX6177

## 2016-09-09 LAB — PROTIME-INR
INR: 1.2
PROTHROMBIN TIME: 15.3 s — AB (ref 11.4–15.2)

## 2016-09-09 SURGERY — BRONCHOSCOPY, WITH EBUS
Anesthesia: General

## 2016-09-09 MED ORDER — FENTANYL CITRATE (PF) 100 MCG/2ML IJ SOLN: 25.0000 ug | INTRAMUSCULAR | Status: DC | PRN

## 2016-09-09 MED ORDER — WARFARIN SODIUM 7.5 MG PO TABS
7.5000 mg | ORAL_TABLET | Freq: Once | ORAL | Status: AC
  Administered 2016-09-09: 7.5 mg via ORAL
  Filled 2016-09-09: qty 1

## 2016-09-09 MED ORDER — PROPOFOL 10 MG/ML IV BOLUS
INTRAVENOUS | Status: DC | PRN
  Administered 2016-09-09: 100 mg via INTRAVENOUS

## 2016-09-09 MED ORDER — LACTATED RINGERS IV SOLN
INTRAVENOUS | Status: DC | PRN
  Administered 2016-09-09: 09:00:00 via INTRAVENOUS

## 2016-09-09 MED ORDER — ONDANSETRON HCL 4 MG/2ML IJ SOLN
INTRAMUSCULAR | Status: DC | PRN
  Administered 2016-09-09: 4 mg via INTRAVENOUS

## 2016-09-09 MED ORDER — LACTATED RINGERS IV SOLN
INTRAVENOUS | Status: DC
  Administered 2016-09-09: 08:00:00 via INTRAVENOUS

## 2016-09-09 MED ORDER — PHENYLEPHRINE HCL 10 MG/ML IJ SOLN
INTRAVENOUS | Status: DC | PRN
  Administered 2016-09-09: 15 ug/min via INTRAVENOUS

## 2016-09-09 MED ORDER — SUGAMMADEX SODIUM 200 MG/2ML IV SOLN
INTRAVENOUS | Status: DC | PRN
Start: 2016-09-09 — End: 2016-09-09
  Administered 2016-09-09: 200 mg via INTRAVENOUS

## 2016-09-09 MED ORDER — FENTANYL CITRATE (PF) 100 MCG/2ML IJ SOLN
INTRAMUSCULAR | Status: DC | PRN
  Administered 2016-09-09 (×2): 50 ug via INTRAVENOUS

## 2016-09-09 MED ORDER — LIDOCAINE HCL (CARDIAC) 20 MG/ML IV SOLN
INTRAVENOUS | Status: DC | PRN
  Administered 2016-09-09: 60 mg via INTRAVENOUS

## 2016-09-09 MED ORDER — MIDAZOLAM HCL 2 MG/2ML IJ SOLN
INTRAMUSCULAR | Status: AC
  Filled 2016-09-09: qty 2

## 2016-09-09 MED ORDER — WARFARIN - PHARMACIST DOSING INPATIENT: Freq: Every day | Status: DC

## 2016-09-09 MED ORDER — MIDAZOLAM HCL 5 MG/5ML IJ SOLN
INTRAMUSCULAR | Status: DC | PRN
  Administered 2016-09-09: 2 mg via INTRAVENOUS

## 2016-09-09 MED ORDER — EPINEPHRINE PF 1 MG/ML IJ SOLN
INTRAMUSCULAR | Status: DC | PRN
  Administered 2016-09-09: 250 mL

## 2016-09-09 MED ORDER — PROMETHAZINE HCL 25 MG/ML IJ SOLN: 6.2500 mg | INTRAMUSCULAR | Status: DC | PRN

## 2016-09-09 MED ORDER — EPHEDRINE SULFATE 50 MG/ML IJ SOLN
INTRAMUSCULAR | Status: DC | PRN
  Administered 2016-09-09 (×2): 10 mg via INTRAVENOUS

## 2016-09-09 MED ORDER — FENTANYL CITRATE (PF) 250 MCG/5ML IJ SOLN
INTRAMUSCULAR | Status: AC
  Filled 2016-09-09: qty 5

## 2016-09-09 MED ORDER — ENOXAPARIN SODIUM 100 MG/ML ~~LOC~~ SOLN
85.0000 mg | Freq: Two times a day (BID) | SUBCUTANEOUS | Status: DC
  Administered 2016-09-09 – 2016-09-15 (×13): 85 mg via SUBCUTANEOUS
  Filled 2016-09-09 (×14): qty 0.85

## 2016-09-09 MED ORDER — ROCURONIUM BROMIDE 100 MG/10ML IV SOLN
INTRAVENOUS | Status: DC | PRN
  Administered 2016-09-09 (×2): 10 mg via INTRAVENOUS
  Administered 2016-09-09: 30 mg via INTRAVENOUS

## 2016-09-09 MED ORDER — EPINEPHRINE PF 1 MG/ML IJ SOLN
INTRAMUSCULAR | Status: AC
  Filled 2016-09-09: qty 1

## 2016-09-09 SURGICAL SUPPLY — 28 items
BRUSH CYTOL CELLEBRITY 1.5X140 (MISCELLANEOUS) IMPLANT
CANISTER SUCT 3000ML PPV (MISCELLANEOUS) ×2 IMPLANT
CONT SPEC 4OZ CLIKSEAL STRL BL (MISCELLANEOUS) ×2 IMPLANT
COVER BACK TABLE 60X90IN (DRAPES) ×2 IMPLANT
COVER DOME SNAP 22 D (MISCELLANEOUS) ×2 IMPLANT
FILTER STRAW FLUID ASPIR (MISCELLANEOUS) ×1 IMPLANT
FORCEPS BIOP RJ4 1.8 (CUTTING FORCEPS) IMPLANT
GAUZE SPONGE 4X4 12PLY STRL (GAUZE/BANDAGES/DRESSINGS) ×2 IMPLANT
GLOVE BIO SURGEON STRL SZ 6.5 (GLOVE) ×2 IMPLANT
KIT CLEAN ENDO COMPLIANCE (KITS) ×5 IMPLANT
KIT ROOM TURNOVER OR (KITS) ×2 IMPLANT
MARKER SKIN DUAL TIP RULER LAB (MISCELLANEOUS) ×2 IMPLANT
NDL BIOPSY TRANSBRONCH 21G (NEEDLE) IMPLANT
NDL BLUNT 18X1 FOR OR ONLY (NEEDLE) IMPLANT
NDL EBUS SONO TIP PENTAX (NEEDLE) ×1 IMPLANT
NEEDLE BIOPSY TRANSBRONCH 21G (NEEDLE) IMPLANT
NEEDLE BLUNT 18X1 FOR OR ONLY (NEEDLE) IMPLANT
NEEDLE EBUS SONO TIP PENTAX (NEEDLE) ×2 IMPLANT
NS IRRIG 1000ML POUR BTL (IV SOLUTION) ×2 IMPLANT
OIL SILICONE PENTAX (PARTS (SERVICE/REPAIRS)) ×2 IMPLANT
PAD ARMBOARD 7.5X6 YLW CONV (MISCELLANEOUS) ×4 IMPLANT
SYR 20CC LL (SYRINGE) ×2 IMPLANT
SYR 20ML ECCENTRIC (SYRINGE) ×2 IMPLANT
SYR 3ML LL SCALE MARK (SYRINGE) ×1 IMPLANT
TOWEL OR 17X24 6PK STRL BLUE (TOWEL DISPOSABLE) ×2 IMPLANT
TRAP SPECIMEN MUCOUS 40CC (MISCELLANEOUS) ×2 IMPLANT
TUBE CONNECTING 20X1/4 (TUBING) ×4 IMPLANT
WATER STERILE IRR 1000ML POUR (IV SOLUTION) ×2 IMPLANT

## 2016-09-09 NOTE — Brief Op Note (Addendum)
      [address]       [place] [address]             [phone]     09/09/2016  10:26 AM  PATIENT:  [name] [name]  76 y.o. 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  BLOOD ADMINISTERED:none  DRAINS: none   LOCAL MEDICATIONS USED:  NONE  SPECIMEN:  Source of Specimen:  #7 nodes  DISPOSITION OF SPECIMEN:  PATHOLOGY  COUNTS:  YES   DICTATION: .Dragon Dictation  PLAN OF CARE: inpatient  PATIENT DISPOSITION:  PACU - hemodynamically stable.   Delay start of Pharmacological VTE agent (>24hrs) due to surgical blood loss or risk of bleeding: resume preop heparin in 8 hours

## 2016-09-09 NOTE — Progress Notes (Signed)
PHYSICAL MEDICINE & REHABILITATION     PROGRESS NOTE  Subjective/Complaints:  No reported issues.  Pt to have biopsy today, was made NPO after midnight.  ROS: Denies CP, SOB, N/V/D.  Objective: Vital Signs: Blood pressure (!) 126/40, pulse (!) 56, temperature 97.3 F (36.3 C), temperature source Oral, resp. rate 20, height '5\' 6"'$  (1.676 m), weight 84.1 kg (185 lb 6.4 oz), SpO2 95 %. Dg Chest 2 View  Result Date: 09/08/2016 CLINICAL DATA:  Preoperative evaluation.  Hypertension. EXAM: CHEST  2 VIEW COMPARISON:  September 06, 2016 FINDINGS: There are scattered areas of mild scarring. There is no edema or consolidation. Heart is borderline enlarged with pulmonary vascularity within normal limits. Patient is status post coronary artery bypass grafting. There is a left subclavian stent. No adenopathy. There is degenerative change in the thoracic spine. There is aortic atherosclerosis. IMPRESSION: Areas of mild scarring. No edema or consolidation. Heart borderline enlarged. Aortic atherosclerosis. Electronically Signed   By: Lowella Grip III M.D.   On: 09/08/2016 19:44    Recent Labs  09/08/16 0545 09/08/16 1550  WBC 6.0 6.9  HGB 10.2* 11.1*  HCT 31.0* 34.1*  PLT 369 428*    Recent Labs  09/08/16 0545 09/08/16 1550  NA 132* 132*  K 4.0 4.6  CL 95* 96*  GLUCOSE 97 108*  BUN 20 22*  CREATININE 1.03 1.23  CALCIUM 8.7* 8.9   CBG (last 3)  No results for input(s): GLUCAP in the last 72 hours.  Wt Readings from Last 3 Encounters:  08/31/16 84.1 kg (185 lb 6.4 oz)  08/22/16 71.7 kg (158 lb)  08/18/16 71.8 kg (158 lb 6 oz)    Physical Exam:  BP (!) 126/40 (BP Location: Left Arm)   Pulse (!) 56   Temp 97.3 F (36.3 C) (Oral)   Resp 20   Ht '5\' 6"'$  (1.676 m)   Wt 84.1 kg (185 lb 6.4 oz)   SpO2 95%   BMI 29.92 kg/m  Constitutional: He appears well-developed and well-nourished.  HENT: Normocephalic and atraumatic.  Eyes: EOMI. No discharge.  Cardiovascular: RRR.  No JVD. +murmur. Respiratory: Effort normal. Clear GI: Soft. Bowel sounds are normal.  Musculoskeletal: He exhibits edema and tenderness.  GU: Scrotal edema.  Neurological: He is alert.  HOH Alert, confused, slightly improving Motor: RUE 4+/5 proximal to distal LUE: 4/5 proximal to distal RLE: 3-/5 proximal to distal LLE: 3/5 HF, 3+/5 KE, 4-/5 ADF/PF (slightly improved) Skin: Skin is warm and dry. Dressing to ORIF c/d/i Psychiatric: His mood appears anxious, improving.   Assessment/Plan: 1. Functional deficits secondary to closed pelvic ring fracture with retroperitoneal hemorrhage in the left pelvis s/p ORIF of SI after fall which require 3+ hours per day of interdisciplinary therapy in a comprehensive inpatient rehab setting. Physiatrist is providing close team supervision and 24 hour management of active medical problems listed below. Physiatrist and rehab team continue to assess barriers to discharge/monitor patient progress toward functional and medical goals.  Function:  Bathing Bathing position   Position: Sitting EOB  Bathing parts Body parts bathed by patient: Right arm, Left arm, Chest, Abdomen, Front perineal area, Right upper leg, Left upper leg Body parts bathed by helper: Buttocks, Right lower leg, Left lower leg, Back  Bathing assist        Upper Body Dressing/Undressing Upper body dressing   What is the patient wearing?: Pull over shirt/dress     Pull over shirt/dress - Perfomed by patient: Thread/unthread right sleeve, Thread/unthread  left sleeve, Put head through opening, Pull shirt over trunk          Upper body assist Assist Level: Supervision or verbal cues      Lower Body Dressing/Undressing Lower body dressing   What is the patient wearing?: Pants, Non-skid slipper socks     Pants- Performed by patient: Thread/unthread right pants leg Pants- Performed by helper: Pull pants up/down, Thread/unthread left pants leg   Non-skid slipper socks-  Performed by helper: Don/doff right sock, Don/doff left sock                  Lower body assist Assist for lower body dressing: 2 Helpers      Toileting Toileting     Toileting steps completed by helper: Adjust clothing prior to toileting, Performs perineal hygiene, Adjust clothing after toileting    Toileting assist Assist level: Two helpers   Transfers Chair/bed transfer   Chair/bed transfer method: Squat pivot Chair/bed transfer assist level: Supervision or verbal cues Chair/bed transfer assistive device: Armrests     Locomotion Ambulation Ambulation activity did not occur: Safety/medical concerns (orders for transfers only)         Wheelchair   Type: Manual Max wheelchair distance: 500 ft Assist Level: Supervision or verbal cues  Cognition Comprehension Comprehension assist level: Follows complex conversation/direction with extra time/assistive device  Expression Expression assist level: Expresses basic needs/ideas: With no assist  Social Interaction Social Interaction assist level: Interacts appropriately 90% of the time - Needs monitoring or encouragement for participation or interaction.  Problem Solving Problem solving assist level: Solves basic 25 - 49% of the time - needs direction more than half the time to initiate, plan or complete simple activities  Memory Memory assist level: Recognizes or recalls 25 - 49% of the time/requires cueing 50 - 75% of the time    Medical Problem List and Plan: 1.  Decreased functional mobility secondary to Closed pelvic ring fracture with disorder of left sacroiliac joint with retroperitoneal hemorrhage in the left side of the pelvis with subcutaneous hemorrhage in the left buttocks after fall status post ORIF with sacroiliac screw fixation of left SI joint 08/25/2016. Weightbearing as tolerated right lower extremity for transfers only nonweightbearing left lower extremity  Cont CIR 2.  Acute DVT:   Subcutaneous Lovenox,  holding coumadin resume after Bronch today  Vascular study with b/l peroneal and tibial veins  Monitor for any bleeding episodes 3. Pain Management: Oxycodone and Percocet as needed 4. Mood: Provide emotional support 5. Neuropsych: This patient is not capable of making decisions on his own behalf. 6. Skin/Wound Care: Routine skin checks  Nystatin ordered for perineal area, improved 7. Fluids/Electrolytes/Nutrition: Routine I&Os 8. Acute blood loss anemia.   Hb 10.2 on 3/22  Labs ordered for Monday  Cont to monitor 9. Hypertension.   Lopressor decreased to 50 mg twice a day on 3/19, decreased to 37.5 BID on 3/20, decreased to 12.5 on 3/21  Maxzide 37.5.-25 milligrams daily  Controlled 3/23 10. CAD status post CABG. No chest pain or shortness of breath. Plavix resumed.   On ASA, Coumadin on hold, to resume today 11. Atrial fibrillation. Cardiac rate controlled. Continue Lopressor. 12. PVD with multiple revascularization procedures. Continue Plavix. 13. Right middle lobe lung/brain mass.    CTS consulted, bronch with plans for bx.   Will follow up results 14. Scrotal edema with urinary retention  Failed voiding trial, Foley replaced  Bumex started 3/22  Continue Flomax 15. Hypothyroidism. Synthroid 16. Hyperlipidemia.  Crestor 17. Constipation. Laxative assistance 18. Hyponatremia  Na+ 132 on 3/22  Labs ordered for Monday  Cont to monitor 19. Hypokalemia  K+ 4.0 on 3/22  Supplementing  Cont to monitor  Labs ordered for Monday 20. Hypoalbuminemia  Supplement initiated 3/15 21. Sleep disturbance  Trazodone PRN started 3/15 22. Leukocytosis: Resolved 23. Confusion: Improving  Multifactorial ?UTI +/- meds, +/- sleep +/- TBI 24. UTI  Cont abx, to be completed today 25. HCAP  Cont Vanc to be completed today  CXR 3/20 reviewed, stable 26. Constipation    Increased meds  LOS (Days) 9 A FACE TO FACE EVALUATION WAS PERFORMED  Ankit Lorie Phenix 09/09/2016 8:27 AM

## 2016-09-09 NOTE — Anesthesia Postprocedure Evaluation (Signed)
Anesthesia Post Note  Patient: [name] [name]  Procedure(s) Performed: Procedure(s) (LRB): VIDEO BRONCHOSCOPY WITH ENDOBRONCHIAL ULTRASOUND ([name])  Patient location during evaluation: PACU Anesthesia Type: General Level of consciousness: awake and alert Pain management: pain level controlled Vital Signs Assessment: post-procedure vital signs reviewed and stable Respiratory status: spontaneous breathing, nonlabored ventilation, respiratory function stable and patient connected to nasal cannula oxygen Cardiovascular status: blood pressure returned to baseline and stable Postop Assessment: no signs of nausea or vomiting Anesthetic complications: no       Last Vitals:  Vitals:   09/09/16 1100 09/09/16 1105  BP:    Pulse: 75 73  Resp: 15 16  Temp:  36.7 C    Last Pain:  Vitals:   09/09/16 0551  TempSrc: Oral  PainSc:                  [name]

## 2016-09-09 NOTE — Transfer of Care (Signed)
Immediate Anesthesia Transfer of Care Note  Patient: [name] [name]  Procedure(s) Performed: Procedure(s): VIDEO BRONCHOSCOPY WITH ENDOBRONCHIAL ULTRASOUND ([name])  Patient Location: PACU  Anesthesia Type:General  Level of Consciousness: awake, alert , oriented and patient cooperative  Airway & Oxygen Therapy: Patient Spontanous Breathing and Patient connected to nasal cannula oxygen  Post-op Assessment: Report given to RN and Post -op Vital signs reviewed and stable  Post vital signs: Reviewed and stable  Last Vitals:  Vitals:   09/09/16 1030 09/09/16 1032  BP:  93/72  Pulse: 79 82  Resp: 18 18  Temp: 37.1 C     Last Pain:  Vitals:   09/09/16 0551  TempSrc: Oral  PainSc:       Patients Stated Pain Goal: 2 (25/00/37 0488)  Complications: No apparent anesthesia complications

## 2016-09-09 NOTE — Progress Notes (Signed)
Pre Procedure note for inpatients:   [name] [name] has been scheduled for Procedure(s): VIDEO BRONCHOSCOPY WITH ENDOBRONCHIAL ULTRASOUND ([name]) today. The various methods of treatment have been discussed with the patient. After consideration of the risks, benefits and treatment options the patient has consented to the planned procedure.   The patient has been seen and labs reviewed. There are no changes in the patient's condition to prevent proceeding with the planned procedure today.  Recent labs:  Lab Results  Component Value Date   WBC 6.9 09/08/2016   HGB 11.1 (L) 09/08/2016   HCT 34.1 (L) 09/08/2016   PLT 428 (H) 09/08/2016   GLUCOSE 108 (H) 09/08/2016   CHOL 117 03/26/2016   TRIG 152 (H) 03/26/2016   HDL 45 03/26/2016   LDLCALC 42 03/26/2016   ALT 28 09/08/2016   AST 44 (H) 09/08/2016   NA 132 (L) 09/08/2016   K 4.6 09/08/2016   CL 96 (L) 09/08/2016   CREATININE 1.23 09/08/2016   BUN 22 (H) 09/08/2016   CO2 26 09/08/2016   TSH 0.219 (L) 03/26/2016   PSA 2.83 06/28/2014   INR 1.20 09/09/2016   HGBA1C 5.7 07/09/2013    Grace Isaac, MD 09/09/2016 8:47 AM

## 2016-09-09 NOTE — Progress Notes (Signed)
ANTICOAGULATION CONSULT NOTE - Follow Up Consult  Pharmacy Consult for lovenox/coumadin Indication: DVT  Allergies  Allergen Reactions  . Imitrex [Sumatriptan] Nausea And Vomiting and Other (See Comments)    "made me like I was having a stoke"  . Lipitor [Atorvastatin] Nausea And Vomiting and Other (See Comments)    INTOLERANCE > MYALGIAS "couldn't get out of the bed ( pt had to crawl out of bed ) I hurt so bad"    Patient Measurements: Height: '5\' 6"'$  (167.6 cm) Weight: 185 lb 6.4 oz (84.1 kg) IBW/kg (Calculated) : 63.8 Heparin Dosing Weight:   Vital Signs: Temp: 98 F (36.7 C) (03/23 1105) Temp Source: Oral (03/23 0551) BP: 114/54 (03/23 1045) Pulse Rate: 73 (03/23 1105)  Labs:  Recent Labs  09/07/16 0445 09/08/16 0545 09/08/16 1550 09/09/16 0521  HGB  --  10.2* 11.1*  --   HCT  --  31.0* 34.1*  --   PLT  --  369 428*  --   APTT  --   --  41*  --   LABPROT 19.4* 17.0* 16.1* 15.3*  INR 1.62 1.37 1.28 1.20  CREATININE 0.88 1.03 1.23  --     Estimated Creatinine Clearance: 50.3 mL/min (by C-G formula based on SCr of 1.23 mg/dL).   Assessment: 76 yo male with DVT is currently on treatment dose of lovenox. Held coumadin for lung biopsy, which was done this morning, will restart coumadin today, INR 1.2 today. crcl ~ 50 ml/min. Hgb 11.1 and Plt 428 K stable.   Goal of Therapy:  Anti-Xa level 0.6-1 units/ml 4hrs after LMWH dose given Monitor platelets by anticoagulation protocol: Yes   Plan:  -Continue Lovenox 85 mg SCq12h -Coumadin 7.5 mg po x 1 -Daily PT/INR   Maryanna Shape, PharmD, BCPS  Clinical Pharmacist  Pager: [phone]   09/09/2016,1:04 PM

## 2016-09-09 NOTE — Anesthesia Preprocedure Evaluation (Signed)
Anesthesia Evaluation  Patient identified by MRN, date of birth, ID band Patient awake    Reviewed: Allergy & Precautions, NPO status , Patient's Chart, lab work & pertinent test results  Airway Mallampati: II  TM Distance: >3 FB Neck ROM: Full    Dental  (+) Edentulous Upper, Edentulous Lower   Pulmonary COPD, former smoker,    breath sounds clear to auscultation       Cardiovascular hypertension, Pt. on medications and Pt. on home beta blockers + CAD, + Past MI, + CABG and + Peripheral Vascular Disease (s/p AAA repair and left carotid endarterectomy)   Rhythm:Regular Rate:Normal  12/2014: Left ventricle: The cavity size was normal. Systolic function was   normal. The estimated ejection fraction was in the range of 55%   to 60%. Wall motion was normal; there were no regional wall   motion abnormalities. There was an increased relative   contribution of atrial contraction to ventricular filling.   Doppler parameters are consistent with abnormal left ventricular   relaxation (grade 1 diastolic dysfunction). - Aortic valve: Mildly calcified annulus. Trileaflet; normal   thickness, mildly calcified leaflets. Transvalvular velocity was   within the normal range. There was no stenosis. - Mitral valve: Calcified annulus. There was mild regurgitation. - Pulmonic valve: There was trivial regurgitation.   Neuro/Psych  Headaches, PSYCHIATRIC DISORDERS    GI/Hepatic Neg liver ROS, hiatal hernia, GERD  ,  Endo/Other  Hypothyroidism   Renal/GU Renal disease     Musculoskeletal   Abdominal   Peds  Hematology  (+) Blood dyscrasia (THrombocytopenia), anemia ,   Anesthesia Other Findings   Reproductive/Obstetrics                             Lab Results  Component Value Date   WBC 6.9 09/08/2016   HGB 11.1 (L) 09/08/2016   HCT 34.1 (L) 09/08/2016   MCV 90.0 09/08/2016   PLT 428 (H) 09/08/2016   Lab  Results  Component Value Date   CREATININE 1.23 09/08/2016   BUN 22 (H) 09/08/2016   NA 132 (L) 09/08/2016   K 4.6 09/08/2016   CL 96 (L) 09/08/2016   CO2 26 09/08/2016    Anesthesia Physical  Anesthesia Plan  ASA: III  Anesthesia Plan: General   Post-op Pain Management:    Induction: Intravenous  Airway Management Planned: Oral ETT  Additional Equipment:   Intra-op Plan:   Post-operative Plan: Extubation in OR  Informed Consent: I have reviewed the patients History and Physical, chart, labs and discussed the procedure including the risks, benefits and alternatives for the proposed anesthesia with the patient or authorized representative who has indicated his/her understanding and acceptance.   Dental advisory given  Plan Discussed with: CRNA  Anesthesia Plan Comments:         Anesthesia Quick Evaluation

## 2016-09-09 NOTE — Plan of Care (Signed)
Problem: RH BLADDER ELIMINATION Goal: RH STG MANAGE BLADDER WITH ASSISTANCE STG Manage Bladder With Assistance. Mod  Outcome: Not Progressing Foley

## 2016-09-10 ENCOUNTER — Inpatient Hospital Stay (HOSPITAL_COMMUNITY): Payer: Medicare Other | Admitting: Physical Therapy

## 2016-09-10 ENCOUNTER — Inpatient Hospital Stay (HOSPITAL_COMMUNITY): Payer: Medicare Other

## 2016-09-10 ENCOUNTER — Encounter (HOSPITAL_COMMUNITY): Payer: Self-pay | Admitting: Cardiothoracic Surgery

## 2016-09-10 LAB — PROTIME-INR
INR: 1.14
Prothrombin Time: 14.7 seconds (ref 11.4–15.2)

## 2016-09-10 LAB — CREATININE, SERUM: CREATININE: 1.03 mg/dL (ref 0.61–1.24)

## 2016-09-10 MED ORDER — WARFARIN SODIUM 7.5 MG PO TABS
7.5000 mg | ORAL_TABLET | Freq: Once | ORAL | Status: AC
Start: 2016-09-10 — End: 2016-09-10
  Administered 2016-09-10: 7.5 mg via ORAL
  Filled 2016-09-10: qty 1

## 2016-09-10 NOTE — Progress Notes (Signed)
Physical Therapy Session Note  Patient Details  Name: [name] [name] MRN: [id number] Date of Birth: 1940-07-03  Today's Date: 09/10/2016 PT Individual Time: 7425-5258 PT Individual Time Calculation (min): 40 min   Short Term Goals: Week 1:  PT Short Term Goal 1 (Week 1): = LTGs due to LOS Week 2:  PT Short Term Goal 1 (Week 2): = LTGs due to anticipated LOS  Skilled Therapeutic Interventions/Progress Updates:   Pt received sitting in recliner and agreeable to PT  Transfer training from recliner to [facility] with min assist x 2 each direction with moderate cues for sequecning and proper UE placement for safety.  WC mobility through hall with supervision assist from PT with min cues for awareness of familiar location in rehab unit. Transfer training to and from mat table with close supervision assist from PT and moderate cues for set up and WC parts management. Sit<>supine on mat table with supervision assist from PT. Supine therex hip adduction/adduction. Reciprocal marching, heel slides, clam shells with light manual resistance. All completed x 10 BLE with AAROM from PT to prevent WB through affected LE.   Patient returned too room and left sitting in [facility] with call bell in reach and all needs met.        Therapy Documentation Precautions:  Precautions Precautions: Fall Restrictions Weight Bearing Restrictions: Yes RLE Weight Bearing: Weight bearing as tolerated LLE Weight Bearing: Non weight bearing Other Position/Activity Restrictions: bed to chair only for 8 weeks; WBAT RLE for transfers only General:   Vital Signs: Therapy Vitals Temp: 97.8 F (36.6 C) Temp Source: Oral Pulse Rate: 71 Resp: 18 BP: (!) 120/43 Patient Position (if appropriate): Sitting Oxygen Therapy SpO2: 100 % O2 Device: Not Delivered Pain: Pain Assessment Pain Assessment: No/denies pain   See Function Navigator for Current Functional Status.   Therapy/Group: Individual Therapy  Lorie Phenix 09/10/2016, 4:53 PM

## 2016-09-10 NOTE — Progress Notes (Signed)
      [address]       [place] [address]             [phone]       1 Day Post-Op Procedure(s) (LRB): VIDEO BRONCHOSCOPY WITH ENDOBRONCHIAL ULTRASOUND ([name])  Subjective: Patient awake and alert this am. He states his breathing is pretty good.  Objective: Vital signs in last 24 hours: Temp:  [98 F (36.7 C)-98.7 F (37.1 C)] 98.4 F (36.9 C) (03/24 0500) Pulse Rate:  [60-82] 68 (03/24 0500) Cardiac Rhythm: Normal sinus rhythm (03/23 1100) Resp:  [15-18] 17 (03/24 0500) BP: (93-121)/(40-72) 121/47 (03/24 0500) SpO2:  [90 %-100 %] 96 % (03/24 0500)     Intake/Output from previous day: 03/23 0701 - 03/24 0700 In: 1450 [P.O.:600; I.V.:600; IV Piggyback:250] Out: 3135 [Urine:3125; Blood:10]   Physical Exam:  Pulmonary: Clear to auscultation bilaterally   Lab Results: CBC: Recent Labs  09/08/16 0545 09/08/16 1550  WBC 6.0 6.9  HGB 10.2* 11.1*  HCT 31.0* 34.1*  PLT 369 428*   BMET:  Recent Labs  09/08/16 0545 09/08/16 1550 09/10/16 0348  NA 132* 132*  --   K 4.0 4.6  --   CL 95* 96*  --   CO2 26 26  --   GLUCOSE 97 108*  --   BUN 20 22*  --   CREATININE 1.03 1.23 1.03  CALCIUM 8.7* 8.9  --     PT/INR:  Recent Labs  09/10/16 0348  LABPROT 14.7  INR 1.14   ABG:  INR: Will add last result for INR, ABG once components are confirmed Will add last 4 CBG results once components are confirmed  Assessment/Plan:  Pulmonary - S/p Bronchoscopy and EBUS. Await pathology.   ZIMMERMAN,DONIELLE MPA-C 09/10/2016,9:14 AM

## 2016-09-10 NOTE — Progress Notes (Signed)
[place] PHYSICAL MEDICINE & REHABILITATION     PROGRESS NOTE  Subjective/Complaints:  No problems yesterday. Mild pain. No respiratory issues  ROS: pt denies nausea, vomiting, diarrhea, cough, shortness of breath or chest pain  Objective: Vital Signs: Blood pressure (!) 121/47, pulse 68, temperature 98.4 F (36.9 C), temperature source Oral, resp. rate 17, height '5\' 6"'$  (1.676 m), weight 84.1 kg (185 lb 6.4 oz), SpO2 96 %. Dg Chest 2 View  Result Date: 09/08/2016 CLINICAL DATA:  Preoperative evaluation.  Hypertension. EXAM: CHEST  2 VIEW COMPARISON:  September 06, 2016 FINDINGS: There are scattered areas of mild scarring. There is no edema or consolidation. Heart is borderline enlarged with pulmonary vascularity within normal limits. Patient is status post coronary artery bypass grafting. There is a left subclavian stent. No adenopathy. There is degenerative change in the thoracic spine. There is aortic atherosclerosis. IMPRESSION: Areas of mild scarring. No edema or consolidation. Heart borderline enlarged. Aortic atherosclerosis. Electronically Signed   By: Lowella Grip III M.D.   On: 09/08/2016 19:44    Recent Labs  09/08/16 0545 09/08/16 1550  WBC 6.0 6.9  HGB 10.2* 11.1*  HCT 31.0* 34.1*  PLT 369 428*    Recent Labs  09/08/16 0545 09/08/16 1550 09/10/16 0348  NA 132* 132*  --   K 4.0 4.6  --   CL 95* 96*  --   GLUCOSE 97 108*  --   BUN 20 22*  --   CREATININE 1.03 1.23 1.03  CALCIUM 8.7* 8.9  --    CBG (last 3)  No results for input(s): GLUCAP in the last 72 hours.  Wt Readings from Last 3 Encounters:  08/31/16 84.1 kg (185 lb 6.4 oz)  08/22/16 71.7 kg (158 lb)  08/18/16 71.8 kg (158 lb 6 oz)    Physical Exam:  BP (!) 121/47 (BP Location: Left Arm)   Pulse 68   Temp 98.4 F (36.9 C) (Oral)   Resp 17   Ht '5\' 6"'$  (1.676 m)   Wt 84.1 kg (185 lb 6.4 oz)   SpO2 96%   BMI 29.92 kg/m  Constitutional: He appears well-developed and well-nourished.  HENT:  Normocephalic and atraumatic.  Eyes: EOMI. No discharge.  Cardiovascular: RRR Respiratory: normal effort GI: Soft. Bowel sounds are normal.  Musculoskeletal: He exhibits edema and tenderness.  GU: Scrotal edema.  Neurological: He is alert.  HOH Alert, confused, slightly improving Motor: RUE 4+/5 proximal to distal LUE: 4/5 proximal to distal RLE: 3-/5 proximal to distal LLE: 3/5 HF, 3+/5 KE, 4-/5 ADF/PF (sl incr) Skin: Skin is warm and dry. Dressing to ORIF remains c/d/i Psychiatric: His mood appears pleasant and appropriate   Assessment/Plan: 1. Functional deficits secondary to closed pelvic ring fracture with retroperitoneal hemorrhage in the left pelvis s/p ORIF of SI after fall which require 3+ hours per day of interdisciplinary therapy in a comprehensive inpatient rehab setting. Physiatrist is providing close team supervision and 24 hour management of active medical problems listed below. Physiatrist and rehab team continue to assess barriers to discharge/monitor patient progress toward functional and medical goals.  Function:  Bathing Bathing position   Position: Sitting EOB  Bathing parts Body parts bathed by patient: Right arm, Left arm, Chest, Abdomen, Front perineal area, Right upper leg, Left upper leg Body parts bathed by helper: Buttocks, Right lower leg, Left lower leg, Back  Bathing assist        Upper Body Dressing/Undressing Upper body dressing   What is the patient  wearing?: Pull over shirt/dress     Pull over shirt/dress - Perfomed by patient: Thread/unthread right sleeve, Thread/unthread left sleeve, Put head through opening, Pull shirt over trunk          Upper body assist Assist Level: Supervision or verbal cues      Lower Body Dressing/Undressing Lower body dressing   What is the patient wearing?: Pants, Non-skid slipper socks     Pants- Performed by patient: Thread/unthread right pants leg Pants- Performed by helper: Pull pants up/down,  Thread/unthread left pants leg   Non-skid slipper socks- Performed by helper: Don/doff right sock, Don/doff left sock                  Lower body assist Assist for lower body dressing: 2 Helpers      Toileting Toileting Toileting activity did not occur: No continent bowel/bladder event   Toileting steps completed by helper: Adjust clothing prior to toileting, Performs perineal hygiene, Adjust clothing after toileting    Toileting assist Assist level: Two helpers   Transfers Chair/bed transfer   Chair/bed transfer method: Squat pivot Chair/bed transfer assist level: Supervision or verbal cues Chair/bed transfer assistive device: Armrests     Locomotion Ambulation Ambulation activity did not occur: Safety/medical concerns (orders for transfers only)         Wheelchair   Type: Manual Max wheelchair distance: 500 ft Assist Level: Supervision or verbal cues  Cognition Comprehension Comprehension assist level: Follows complex conversation/direction with extra time/assistive device  Expression Expression assist level: Expresses basic needs/ideas: With no assist  Social Interaction Social Interaction assist level: Interacts appropriately 90% of the time - Needs monitoring or encouragement for participation or interaction.  Problem Solving Problem solving assist level: Solves basic 25 - 49% of the time - needs direction more than half the time to initiate, plan or complete simple activities  Memory Memory assist level: Recognizes or recalls 25 - 49% of the time/requires cueing 50 - 75% of the time    Medical Problem List and Plan: 1.  Decreased functional mobility secondary to Closed pelvic ring fracture with disorder of left sacroiliac joint with retroperitoneal hemorrhage in the left side of the pelvis with subcutaneous hemorrhage in the left buttocks after fall status post ORIF with sacroiliac screw fixation of left SI joint 08/25/2016. Weightbearing as tolerated right lower  extremity for transfers only nonweightbearing left lower extremity  Cont CIR 2.  Acute DVT:   Subcutaneous Lovenox resumed  Vascular study with b/l peroneal and tibial vein dvt's  Monitor for any bleeding episodes 3. Pain Management: Oxycodone and Percocet as needed 4. Mood: Provide emotional support 5. Neuropsych: This patient is not capable of making decisions on his own behalf. 6. Skin/Wound Care: Routine skin checks  Nystatin ordered for perineal area, improved 7. Fluids/Electrolytes/Nutrition: Routine I&Os 8. Acute blood loss anemia.   Hb 10.2 on 3/22  Labs ordered for Monday  Cont to monitor 9. Hypertension.   Lopressor decreased to 50 mg twice a day on 3/19, decreased to 37.5 BID on 3/20, decreased to 12.5 on 3/21  Maxzide 37.5.-25 milligrams daily  Controlled 3/24 10. CAD status post CABG. No chest pain or shortness of breath. Plavix resumed.   On ASA, Coumadin on hold, to resume today 11. Atrial fibrillation. Cardiac rate controlled. Continue Lopressor. 12. PVD with multiple revascularization procedures. Continue Plavix. 13. Right middle lobe lung/brain mass.    CTS consulted, bronch/bx completed yesterday, path pending    14. Scrotal edema with urinary  retention  Failed voiding trial, Foley replaced  Bumex started 3/22  Continue Flomax 15. Hypothyroidism. Synthroid 16. Hyperlipidemia. Crestor 17. Constipation. Laxative assistance 18. Hyponatremia  Na+ 132 on 3/22  Labs ordered for Monday  Cont to monitor 19. Hypokalemia  K+ 4.0 on 3/22  Supplementing  Cont to monitor  Labs ordered for Monday 20. Hypoalbuminemia  Supplement initiated 3/15 21. Sleep disturbance  Trazodone PRN started 3/15 22. Leukocytosis: Resolved 23. Confusion: much improved  Multifactorial ?UTI +/- meds, +/- sleep +/- TBI 24. UTI  Cont abx, to be completed today 25. HCAP    Vanc  completed   CXR 3/20  stable 26. Constipation    Increased meds  LOS (Days) 10 A FACE TO FACE  EVALUATION WAS PERFORMED  SWARTZ,ZACHARY T 09/10/2016 7:34 AM

## 2016-09-10 NOTE — Progress Notes (Signed)
ANTICOAGULATION CONSULT NOTE - Follow Up Consult  Pharmacy Consult for lovenox/coumadin Indication: DVT  Allergies  Allergen Reactions  . Imitrex [Sumatriptan] Nausea And Vomiting and Other (See Comments)    "made me like I was having a stoke"  . Lipitor [Atorvastatin] Nausea And Vomiting and Other (See Comments)    INTOLERANCE > MYALGIAS "couldn't get out of the bed ( pt had to crawl out of bed ) I hurt so bad"    Patient Measurements: Height: '5\' 6"'$  (167.6 cm) Weight: 185 lb 6.4 oz (84.1 kg) IBW/kg (Calculated) : 63.8  Vital Signs: Temp: 98.4 F (36.9 C) (03/24 0500) Temp Source: Oral (03/24 0500) BP: 121/47 (03/24 0500) Pulse Rate: 68 (03/24 0500)  Labs:  Recent Labs  09/08/16 0545 09/08/16 1550 09/09/16 0521 09/10/16 0348  HGB 10.2* 11.1*  --   --   HCT 31.0* 34.1*  --   --   PLT 369 428*  --   --   APTT  --  41*  --   --   LABPROT 17.0* 16.1* 15.3* 14.7  INR 1.37 1.28 1.20 1.14  CREATININE 1.03 1.23  --  1.03    Estimated Creatinine Clearance: 60.1 mL/min (by C-G formula based on SCr of 1.03 mg/dL).   Assessment: 76 yo male with DVT is currently on treatment dose of lovenox at '85mg'$  SubQ q12H. Coumadin was restarted the evening of 3/23. INR 1.14 today. crcl ~ 60 ml/min. Hgb 11.1 and Plt 428, bothstable.   Goal of Therapy:  Anti-Xa level 0.6-1 units/ml 4hrs after LMWH dose given Monitor platelets by anticoagulation protocol: Yes   Plan:  -Continue Lovenox 85 mg SCq12h -Coumadin 7.5 mg po x 1 -Daily PT/INR  -CBC and SCr as needed  Myer Peer Grayland Ormond), PharmD  PGY1 Pharmacy Resident Pager: [phone] 09/10/2016 7:37 AM

## 2016-09-10 NOTE — Progress Notes (Signed)
Occupational Therapy Session Note  Patient Details  Name: [name] [name] MRN: [id number] Date of Birth: December 23, 1940  Today's Date: 09/10/2016 OT Individual Time: 0915-1030 OT Individual Time Calculation (min): 75 min   Short Term Goals: Week 1:  OT Short Term Goal 1 (Week 2): STGs = LTGs  Skilled Therapeutic Interventions/Progress Updates: ADL-retraining at sink with emphasis on improved attention, memory (immediate and short term), BUE [place], AE retraining (reacher, LH sponge, leg lifter), activity tolerance, standing balance and adherence to WB precautions.    Pt received supine in bed with spouse present.  Pt able to complete bed mobility with supervision and transfer to w/c with steadying assist and mod vc for technique to maintain NWB at LLE.   With setup to place w/c at sink and to provide supplies and clothing, pt completed bathing with overall mod assist and vc to problem-solve and sequence d/t mild confusion.    Pt dressed upper body with setup and lower body with overall max assist.   Pt required max assist to shave d/t safety concerns but groomed independently after setup.       Therapy Documentation Precautions:  Precautions Precautions: Fall Restrictions Weight Bearing Restrictions: Yes RLE Weight Bearing: Weight bearing as tolerated LLE Weight Bearing: Non weight bearing Other Position/Activity Restrictions: bed to chair only for 8 weeks; WBAT RLE for transfers only     Pain: Pain Assessment Pain Assessment: No/denies pain   ADL: ADL ADL Comments: refer to functional navigator   See Function Navigator for Current Functional Status.   Therapy/Group: Individual Therapy   Second session: Time: 1430-1530 Time Calculation (min):  60 min  Pain Assessment: No/denies pain   Skilled Therapeutic Interventions: Therapeutic activity with focus on [facility] at left hand X 15 min followed by gross BUE strengthening using TES (weight machine) and arm ergometry X 10 min using  Sci-Fit (level 2, random routine).   Pt performed seated left hand [place] task (placing clothes pins vertically) with extra time and min vc for attention.   Pt was then educated on PRE using TES (weight-machine) and performed 2 sets of 12 reps, unilateral arm curls (2.5 lbs), pull-downs (15 lbs), and straight arm pull downs (7.5 lbs) followed by 10 min at Sci-Fit.   RN notified of low diastolic bp following treatment.    Pt performed all transfers with only steadying assist and min vc to maintain NWB at LLE.   See FIM for current functional status  Therapy/Group: Individual Therapy  [place] 09/10/2016, 12:18 PM

## 2016-09-11 ENCOUNTER — Inpatient Hospital Stay (HOSPITAL_COMMUNITY): Payer: Medicare Other

## 2016-09-11 LAB — CBC
HEMATOCRIT: 34.9 % — AB (ref 39.0–52.0)
Hemoglobin: 11.4 g/dL — ABNORMAL LOW (ref 13.0–17.0)
MCH: 29.4 pg (ref 26.0–34.0)
MCHC: 32.7 g/dL (ref 30.0–36.0)
MCV: 89.9 fL (ref 78.0–100.0)
PLATELETS: 338 10*3/uL (ref 150–400)
RBC: 3.88 MIL/uL — ABNORMAL LOW (ref 4.22–5.81)
RDW: 16.4 % — AB (ref 11.5–15.5)
WBC: 5.9 10*3/uL (ref 4.0–10.5)

## 2016-09-11 LAB — PROTIME-INR
INR: 1.23
Prothrombin Time: 15.5 seconds — ABNORMAL HIGH (ref 11.4–15.2)

## 2016-09-11 MED ORDER — WARFARIN SODIUM 7.5 MG PO TABS
7.5000 mg | ORAL_TABLET | Freq: Once | ORAL | Status: AC
  Administered 2016-09-11: 7.5 mg via ORAL
  Filled 2016-09-11: qty 1

## 2016-09-11 NOTE — Progress Notes (Signed)
ANTICOAGULATION CONSULT NOTE - Follow Up Consult  Pharmacy Consult for lovenox/coumadin Indication: DVT  Allergies  Allergen Reactions  . Imitrex [Sumatriptan] Nausea And Vomiting and Other (See Comments)    "made me like I was having a stoke"  . Lipitor [Atorvastatin] Nausea And Vomiting and Other (See Comments)    INTOLERANCE > MYALGIAS "couldn't get out of the bed ( pt had to crawl out of bed ) I hurt so bad"    Patient Measurements: Height: '5\' 6"'$  (167.6 cm) Weight: 185 lb 6.4 oz (84.1 kg) IBW/kg (Calculated) : 63.8  Vital Signs: Temp: 98.1 F (36.7 C) (03/25 0528) Temp Source: Oral (03/25 0528) BP: 130/47 (03/25 0528) Pulse Rate: 61 (03/25 0528)  Labs:  Recent Labs  09/08/16 1550 09/09/16 0521 09/10/16 0348 09/11/16 0543  HGB 11.1*  --   --  11.4*  HCT 34.1*  --   --  34.9*  PLT 428*  --   --  338  APTT 41*  --   --   --   LABPROT 16.1* 15.3* 14.7 15.5*  INR 1.28 1.20 1.14 1.23  CREATININE 1.23  --  1.03  --     Estimated Creatinine Clearance: 60.1 mL/min (by C-G formula based on SCr of 1.03 mg/dL).   Assessment: 76 yo male with DVT is currently on treatment dose of lovenox at '85mg'$  SubQ q12H. Coumadin was restarted the evening of 3/23. INR 1.23 today. crcl ~ 60 ml/min. Hgb 11.4 and Plt 338, bothstable.   Goal of Therapy:  Anti-Xa level 0.6-1 units/ml 4hrs after LMWH dose given Monitor platelets by anticoagulation protocol: Yes   Plan:  -Continue Lovenox 85 mg SCq12h -Coumadin 7.5 mg po x 1 -Daily PT/INR  -CBC q72h -SCr as needed  Myer Peer Grayland Ormond), PharmD  PGY1 Pharmacy Resident Pager: [phone] 09/11/2016 7:37 AM

## 2016-09-11 NOTE — Plan of Care (Signed)
Problem: RH BLADDER ELIMINATION Goal: RH STG MANAGE BLADDER WITH ASSISTANCE STG Manage Bladder With Assistance. Mod  Outcome: Not Progressing Continues with foley

## 2016-09-11 NOTE — Progress Notes (Signed)
Occupational Therapy Session Note  Patient Details  Name: [name] [name] MRN: [id number] Date of Birth: 1940/09/30  Today's Date: 09/11/2016 OT Individual Time: 1030-1130 OT Individual Time Calculation (min): 60 min   Short Term Goals: Week 1:  OT Short Term Goal 1 (Week 1): STGs = LTGs  Skilled Therapeutic Interventions/Progress Updates: ADL-retraining at sink with emphasis on pt re-ed with use of leg lifter from bed mobility and reacher to don pants while seated in w/c at sink.   Pt received supine in bed with spouse reporting pt was just assisted with toilet hygiene after messy loose BM and he was washed and dressed with new brief.   Pt acknowledged recommendation to come out of bed and resume BADL and UE exercising to improved endurance and reduce need for assist with BADL.   Wife left room as session began.   Pt used leg lifter to completed bed mobility to and rose to sit at EOB unassisted using bedrail and HOB elevated.   After setup to place w/c, pt self-propelled w/c to sink and completed upper body BADL with only setup and vc to sequence.   OT demo'd use of reacher to don pants and pt completed lower body dressing performing lateral leans to pull up pant over right hip unassisted but required min assist for left hip d/t brief created bulky obstruction.    Pt was then escorted to rehab gym to perform 15 min of seated arm ergometry using Sci-[employer].    Pt was able to converse throughout session as he pedaled and reported satisfaction with exercise as instrumental to remain motivated for therapy sessions.   Pt then self-propelled w/c back to his room and was assisted back to recliner to remain upright with wife visiting and attending to his needs.     Therapy Documentation Precautions:  Precautions Precautions: Fall Restrictions Weight Bearing Restrictions: Yes RLE Weight Bearing: Weight bearing as tolerated LLE Weight Bearing: Non weight bearing Other Position/Activity Restrictions: bed to  chair only for 8 weeks; WBAT RLE for transfers only   Vital Signs: Therapy Vitals Pulse Rate: 63 BP: (!) 112/49   Pain: Pain Assessment Pain Assessment: No/denies pain   ADL: ADL ADL Comments: refer to functional navigator   Exercises: Cardiovascular Exercises Exercise Bike: 15 min, level 2, 1.5 miles completed   See Function Navigator for Current Functional Status.   Therapy/Group: Individual Therapy  [place] 09/11/2016, 12:47 PM

## 2016-09-11 NOTE — Progress Notes (Signed)
[facility] PHYSICAL MEDICINE & REHABILITATION     PROGRESS NOTE  Subjective/Complaints:  Feeling well. Denies pain. Pleased with progress  ROS: pt denies nausea, vomiting, diarrhea, cough, shortness of breath or chest pain   Objective: Vital Signs: Blood pressure (!) 130/47, pulse 61, temperature 98.1 F (36.7 C), temperature source Oral, resp. rate 18, height '5\' 6"'$  (1.676 m), weight 84.1 kg (185 lb 6.4 oz), SpO2 100 %. No results found.  Recent Labs  09/08/16 1550 09/11/16 0543  WBC 6.9 5.9  HGB 11.1* 11.4*  HCT 34.1* 34.9*  PLT 428* 338    Recent Labs  09/08/16 1550 09/10/16 0348  NA 132*  --   K 4.6  --   CL 96*  --   GLUCOSE 108*  --   BUN 22*  --   CREATININE 1.23 1.03  CALCIUM 8.9  --    CBG (last 3)  No results for input(s): GLUCAP in the last 72 hours.  Wt Readings from Last 3 Encounters:  08/31/16 84.1 kg (185 lb 6.4 oz)  08/22/16 71.7 kg (158 lb)  08/18/16 71.8 kg (158 lb 6 oz)    Physical Exam:  BP (!) 130/47 (BP Location: Left Arm)   Pulse 61   Temp 98.1 F (36.7 C) (Oral)   Resp 18   Ht '5\' 6"'$  (1.676 m)   Wt 84.1 kg (185 lb 6.4 oz)   SpO2 100%   BMI 29.92 kg/m  Constitutional: He appears well-developed and well-nourished.  HENT: Normocephalic and atraumatic.  Eyes: EOMI. No discharge.  Cardiovascular: RRR Respiratory: normal effort GI: Soft. Bowel sounds are normal.  Musculoskeletal: He exhibits edema and tenderness.  GU: Scrotal edema improving.  Neurological: He is alert.  HOH Alert, oriented Motor: RUE 4+/5 proximal to distal LUE: 4/5 proximal to distal RLE: 3-/5 proximal to distal LLE: 3/5 HF, 3+/5 KE, 4-/5 ADF/PF (sl incr) Skin: Skin is warm and dry. Dressing to ORIF remains c/d/i Psychiatric: His mood appears pleasant and appropriate   Assessment/Plan: 1. Functional deficits secondary to closed pelvic ring fracture with retroperitoneal hemorrhage in the left pelvis s/p ORIF of SI after fall which require 3+ hours per day  of interdisciplinary therapy in a comprehensive inpatient rehab setting. Physiatrist is providing close team supervision and 24 hour management of active medical problems listed below. Physiatrist and rehab team continue to assess barriers to discharge/monitor patient progress toward functional and medical goals.  Function:  Bathing Bathing position   Position: Wheelchair/chair at sink  Bathing parts Body parts bathed by patient: Right arm, Left arm, Chest, Abdomen, Front perineal area, Right upper leg, Left upper leg Body parts bathed by helper: Buttocks, Back  Bathing assist Assist Level:  (Mod assist)      Upper Body Dressing/Undressing Upper body dressing   What is the patient wearing?: Pull over shirt/dress     Pull over shirt/dress - Perfomed by patient: Thread/unthread right sleeve, Thread/unthread left sleeve, Put head through opening, Pull shirt over trunk          Upper body assist Assist Level: Touching or steadying assistance(Pt > 75%)      Lower Body Dressing/Undressing Lower body dressing   What is the patient wearing?: Pants, Shoes, Non-skid slipper socks     Pants- Performed by patient: Thread/unthread right pants leg Pants- Performed by helper: Thread/unthread left pants leg, Thread/unthread right pants leg, Pull pants up/down, Fasten/unfasten pants   Non-skid slipper socks- Performed by helper: Don/doff right sock, Don/doff left sock  Shoes - Performed by patient: Don/doff right shoe, Don/doff left shoe Shoes - Performed by helper: Fasten right, Fasten left          Lower body assist Assist for lower body dressing:  (Max Assist)      Toileting Toileting Toileting activity did not occur: No continent bowel/bladder event   Toileting steps completed by helper: Adjust clothing prior to toileting, Performs perineal hygiene, Adjust clothing after toileting    Toileting assist Assist level: Two helpers   Transfers Chair/bed transfer   Chair/bed  transfer method: Lateral scoot Chair/bed transfer assist level: Touching or steadying assistance (Pt > 75%) Chair/bed transfer assistive device: Armrests     Locomotion Ambulation Ambulation activity did not occur: Safety/medical concerns (orders for transfers only)         Wheelchair   Type: Manual Max wheelchair distance: 500 ft Assist Level: Supervision or verbal cues  Cognition Comprehension Comprehension assist level: Follows complex conversation/direction with extra time/assistive device  Expression Expression assist level: Expresses basic needs/ideas: With no assist  Social Interaction Social Interaction assist level: Interacts appropriately 90% of the time - Needs monitoring or encouragement for participation or interaction.  Problem Solving Problem solving assist level: Solves basic 25 - 49% of the time - needs direction more than half the time to initiate, plan or complete simple activities  Memory Memory assist level: Recognizes or recalls 25 - 49% of the time/requires cueing 50 - 75% of the time    Medical Problem List and Plan: 1.  Decreased functional mobility secondary to Closed pelvic ring fracture with disorder of left sacroiliac joint with retroperitoneal hemorrhage in the left side of the pelvis with subcutaneous hemorrhage in the left buttocks after fall status post ORIF with sacroiliac screw fixation of left SI joint 08/25/2016. Weightbearing as tolerated right lower extremity for transfers only nonweightbearing left lower extremity  Cont CIR 2.  Acute DVT:   Subcutaneous Lovenox resumed  Vascular study with b/l peroneal and tibial vein dvt's  Monitor for any bleeding episodes 3. Pain Management: Oxycodone and Percocet as needed 4. Mood: Provide emotional support 5. Neuropsych: This patient is not capable of making decisions on his own behalf. 6. Skin/Wound Care: Routine skin checks  Nystatin ordered for perineal area, improved 7. Fluids/Electrolytes/Nutrition:  Routine I&Os 8. Acute blood loss anemia.   Hb 10.2 on 3/22  Labs ordered for Monday  Cont to monitor 9. Hypertension.   Lopressor decreased to 50 mg twice a day on 3/19, decreased to 37.5 BID on 3/20, decreased to 12.5 on 3/21  Maxzide 37.5.-25 milligrams daily  Controlled 3/24 10. CAD status post CABG. No chest pain or shortness of breath. Plavix resumed.   On ASA, Coumadin on hold, to resume today 11. Atrial fibrillation. Cardiac rate controlled. Continue Lopressor. 12. PVD with multiple revascularization procedures. Continue Plavix. 13. Right middle lobe lung/brain mass.    CTS consulted, bronch/bx completed yesterday, path pending    14. Scrotal edema with urinary retention  Failed voiding trial, Foley replaced  Bumex started 3/22  Continue Flomax 15. Hypothyroidism. Synthroid 16. Hyperlipidemia. Crestor 17. Constipation. Laxative assistance 18. Hyponatremia  Na+ 132 on 3/22  Labs ordered for Monday  Cont to monitor 19. Hypokalemia  K+ 4.0 on 3/22  Supplementing  Cont to monitor  Labs ordered for Monday 20. Hypoalbuminemia  Supplement initiated 3/15 21. Sleep disturbance  Trazodone PRN started 3/15 22. Leukocytosis: Resolved 23. Confusion: nearly resolved  Multifactorial ?UTI +/- meds, +/- sleep +/- TBI 24. UTI  Cont abx, to be completed today 25. HCAP    Vanc  completed   CXR 3/20  stable 26. Constipation--    Increased meds  LOS (Days) 11 A FACE TO FACE EVALUATION WAS PERFORMED  SWARTZ,ZACHARY T 09/11/2016 8:23 AM

## 2016-09-11 NOTE — Plan of Care (Signed)
Problem: RH BOWEL ELIMINATION Goal: RH STG MANAGE BOWEL W/MEDICATION W/ASSISTANCE STG Manage Bowel with Medication with Assistance. MOd  Outcome: Not Progressing No Bm > 3 days

## 2016-09-11 NOTE — Plan of Care (Signed)
Problem: RH BOWEL ELIMINATION Goal: RH STG MANAGE BOWEL WITH ASSISTANCE STG Manage Bowel with Assistance. Mod  Outcome: Not Progressing No BM > 3 days

## 2016-09-11 NOTE — Progress Notes (Signed)
Intermittent confusion, especially at HS. Left pedal pitting edema noted. PRN tylenol given at 2054, per patient request, for sleep. Wife at bedside. [name] [name] A

## 2016-09-12 ENCOUNTER — Inpatient Hospital Stay (HOSPITAL_COMMUNITY): Payer: Medicare Other

## 2016-09-12 ENCOUNTER — Inpatient Hospital Stay (HOSPITAL_COMMUNITY): Payer: Medicare Other | Admitting: Occupational Therapy

## 2016-09-12 ENCOUNTER — Inpatient Hospital Stay (HOSPITAL_COMMUNITY): Payer: Medicare Other | Admitting: Physical Therapy

## 2016-09-12 LAB — BASIC METABOLIC PANEL
ANION GAP: 14 (ref 5–15)
BUN: 29 mg/dL — ABNORMAL HIGH (ref 6–20)
CALCIUM: 9.5 mg/dL (ref 8.9–10.3)
CO2: 24 mmol/L (ref 22–32)
Chloride: 94 mmol/L — ABNORMAL LOW (ref 101–111)
Creatinine, Ser: 1.06 mg/dL (ref 0.61–1.24)
Glucose, Bld: 137 mg/dL — ABNORMAL HIGH (ref 65–99)
Potassium: 4 mmol/L (ref 3.5–5.1)
Sodium: 132 mmol/L — ABNORMAL LOW (ref 135–145)

## 2016-09-12 LAB — CBC WITH DIFFERENTIAL/PLATELET
BASOS PCT: 1 %
Basophils Absolute: 0.1 10*3/uL (ref 0.0–0.1)
Eosinophils Absolute: 0.1 10*3/uL (ref 0.0–0.7)
Eosinophils Relative: 2 %
HEMATOCRIT: 38.4 % — AB (ref 39.0–52.0)
HEMOGLOBIN: 12.5 g/dL — AB (ref 13.0–17.0)
Lymphocytes Relative: 13 %
Lymphs Abs: 0.8 10*3/uL (ref 0.7–4.0)
MCH: 29.5 pg (ref 26.0–34.0)
MCHC: 32.6 g/dL (ref 30.0–36.0)
MCV: 90.6 fL (ref 78.0–100.0)
MONO ABS: 0.3 10*3/uL (ref 0.1–1.0)
Monocytes Relative: 5 %
NEUTROS ABS: 5 10*3/uL (ref 1.7–7.7)
NEUTROS PCT: 79 %
Platelets: 345 10*3/uL (ref 150–400)
RBC: 4.24 MIL/uL (ref 4.22–5.81)
RDW: 16.3 % — AB (ref 11.5–15.5)
WBC: 6.3 10*3/uL (ref 4.0–10.5)

## 2016-09-12 LAB — PROTIME-INR
INR: 1.31
PROTHROMBIN TIME: 16.4 s — AB (ref 11.4–15.2)

## 2016-09-12 MED ORDER — WARFARIN SODIUM 7.5 MG PO TABS
7.5000 mg | ORAL_TABLET | Freq: Once | ORAL | Status: AC
  Administered 2016-09-12: 7.5 mg via ORAL
  Filled 2016-09-12: qty 1

## 2016-09-12 MED ORDER — SALINE SPRAY 0.65 % NA SOLN
1.0000 | Freq: Four times a day (QID) | NASAL | Status: DC
  Administered 2016-09-12 – 2016-09-16 (×14): 1 via NASAL
  Filled 2016-09-12: qty 44

## 2016-09-12 MED ORDER — AYR SALINE NASAL NA GEL
1.0000 "application " | Freq: Four times a day (QID) | NASAL | Status: DC
  Filled 2016-09-12: qty 14.1

## 2016-09-12 NOTE — Progress Notes (Signed)
Physical Therapy Note  LATE ENTRY  Patient Details  Name: [name] [name] MRN: [id number] Date of Birth: May 17, 1941 Today's Date: 09/12/2016    Pt on medical Hold on 3/23 for biopsy procedure and therefore missed day of skilled PT/OT treatment sessions. Plan to resume normal therapy schedule once back on unit and cleared for therapies. Continue plan of care.   [name], PT, DPT  09/12/2016, 7:40 AM

## 2016-09-12 NOTE — Progress Notes (Signed)
ANTICOAGULATION CONSULT NOTE - Follow Up Consult  Pharmacy Consult for coumadin Indication: DVT  Allergies  Allergen Reactions  . Imitrex [Sumatriptan] Nausea And Vomiting and Other (See Comments)    "made me like I was having a stoke"  . Lipitor [Atorvastatin] Nausea And Vomiting and Other (See Comments)    INTOLERANCE > MYALGIAS "couldn't get out of the bed ( pt had to crawl out of bed ) I hurt Ayasha Ellingsen bad"    Patient Measurements: Height: '5\' 6"'$  (167.6 cm) Weight: 185 lb 6.4 oz (84.1 kg) IBW/kg (Calculated) : 63.8 Heparin Dosing Weight:   Vital Signs: Temp: 98 F (36.7 C) (03/26 0457) Temp Source: Oral (03/26 0457) BP: 122/46 (03/26 0457) Pulse Rate: 55 (03/26 0457)  Labs:  Recent Labs  09/10/16 0348 09/11/16 0543  HGB  --  11.4*  HCT  --  34.9*  PLT  --  338  LABPROT 14.7 15.5*  INR 1.14 1.23  CREATININE 1.03  --     Estimated Creatinine Clearance: 60.1 mL/min (by C-G formula based on SCr of 1.03 mg/dL).   Medications:  Scheduled:  . bumetanide  1 mg Oral Daily  . enoxaparin (LOVENOX) injection  85 mg Subcutaneous Q12H  . feeding supplement (ENSURE ENLIVE)  237 mL Oral BID BM  . gabapentin  100 mg Oral QHS  . levothyroxine  112 mcg Oral QAC breakfast  . linaclotide  145 mcg Oral QAC breakfast  . metoprolol  12.5 mg Oral BID  . nystatin   Topical BID  . pantoprazole  40 mg Oral Daily  . polyethylene glycol  17 g Oral BID  . potassium chloride  20 mEq Oral Daily  . rosuvastatin  20 mg Oral q1800  . senna-docusate  2 tablet Oral BID  . tamsulosin  0.4 mg Oral Daily  . triamterene-hydrochlorothiazide  1 tablet Oral Daily  . Warfarin - Pharmacist Dosing Inpatient   Does not apply q1800   Infusions:  . lactated ringers 10 mL/hr at 09/09/16 4446    Assessment: 76 yo male with DVT is currently on subtherapeutic coumadin bridging with treatment dose of lovenox.  INR today is up to 1.31.  Day 4 of 5 of VTE overlap.   Goal of Therapy:  Anti-Xa level 0.6-1  units/ml 4hrs after LMWH dose given; INR 2-3 Monitor platelets by anticoagulation protocol: Yes   Plan:  - continue lovenox 85 mg sq q12h - coumadin 7.5 mg po x1 - INR In am and CBC every 72 hours  Paisly [place]inger[place], Tsz-Yin 09/12/2016,8:06 AM

## 2016-09-12 NOTE — Progress Notes (Signed)
Physical Therapy Session Note  Patient Details  Name: [name] [name] MRN: [id number] Date of Birth: 06-26-1940  Today's Date: 09/12/2016 PT Individual Time: 0826-0926 PT Individual Time Calculation (min): 60 min   Short Term Goals: Week 2:  PT Short Term Goal 1 (Week 2): = LTGs due to anticipated LOS  Skilled Therapeutic Interventions/Progress Updates:   Bed mobility from flat bed with supervision for cues for initiation. PT assisted patient with threading pants/foley while seated EOB. Performed sit <> stand with min assist maintaining NWB status with RW for support and addressed dynamic standing balance for patient to pull up pants in standing position with min assist. Performed steady assist squat pivot transfer to w/c with cues for hand placement and technique maintaining NWB status. Family education initiated with pt's wife in regards to transfer training for squat pivot technique and w/c parts management - handout also given for step by step sequence and return demonstration x 2 with cues for feedback for technique. HEP for LE strengthening issued and reviewed as well including SLR, hip abduction, hip adduction squeezes,  Quad/glut sets, heel/toe raises in seated position, and LAQ with 5 second hold x 10 reps each BLE. Pt propelled w/c to/from therapy session with supervision for cues for technique and pathfinding.   Pt demonstrates poor carryover throughout session and impaired memory requiring cues.   Therapy Documentation Precautions:  Precautions Precautions: Fall Restrictions Weight Bearing Restrictions: Yes RLE Weight Bearing: Weight bearing as tolerated LLE Weight Bearing: Non weight bearing Other Position/Activity Restrictions: bed to chair only for 8 weeks; WBAT RLE for transfers only  Pain: No complaints.    See Function Navigator for Current Functional Status.   Therapy/Group: Individual Therapy  [name], PT, DPT  09/12/2016, 9:59 AM

## 2016-09-12 NOTE — Op Note (Signed)
NAME:  [name] [name], [name] [name] NO.:  MEDICAL RECORD NO.:  [id number]  LOCATION:                                 FACILITY:  PHYSICIAN:  Lanelle Bal, MD    DATE OF BIRTH:  [birth year]  DATE OF PROCEDURE:  09/09/2016 DATE OF DISCHARGE:                              OPERATIVE REPORT   PREOPERATIVE DIAGNOSIS:  Mediastinal adenopathy.  POSTOPERATIVE DIAGNOSIS:  Mediastinal adenopathy, probable non-small cell lung cancer by preliminary smear.  PROCEDURE PERFORMED:  Bronchoscopy with EBUS and biopsy of #7 lymph nodes.  SURGEON:  Lanelle Bal, MD.  BRIEF HISTORY:  The patient is a 76 year old male who several weeks prior to surgery fell from a ladder suffering multiple injuries and slowly recovered and is now in inpatient present.  During the evaluation for his injuries his CT scan of the chest was performed demonstrating a right lung lesion and significant mediastinal adenopathy, especially large lymph nodes at the carina,#7 area. the patient had a CT scan of head with  evidence suggesting metastatic disease.  Subsequent  MRI of the brain has ben done .  The brain lesions were interpreted byRadiology as more consistent with trauma.  A followup MRI is to be done. As the patient was recovering, consult of Thoracic Surgery was obtained. The films were reviewed and it was recommended to the patient that we obtain a tissue diagnosis . Ct of chest  Is  highly suggestive of primary carcinoma of the lung.  Risks and options were discussed with the patient's family in detail.  He was agreeable and signed informed consent.  DESCRIPTION OF PROCEDURE:  The patient underwent general endotracheal anesthesia without incident.  Appropriate time-out was performed. Through the endotracheal tube, a fiberoptic bronchoscope was passed at subsegmental level both the right and left tracheobronchial tree.  No definite endobronchial lesions were appreciated.  The photographs of this  are in the media tab of the patient's EPIC chart.  We then removed the scope and placed the EBUS scope through the endotracheal tube and to the carina.  In the carina enlarged #7 nodes were easily identifiable and the majority of this was with the scope in the right mainstem bronchus.  The EBUS scope was passed in the right  Main stem bronchus was identified.  Multiple passes with aspirate needle were performed.  The first 3 passes were submitted for rapid  interpretation by Pathology, and was most consistent with non-small cell lung cancer, but final path pending.  From the area of the positive biopsy, additional passes of a total of 6 were done with the remainder of the material obtained, placed into satellite solution for cell block.  This was discussed with Pathology to maximize this for potential genetic marker test.  With the patient stable and without significant bleeding, the scope was removed. The patient was awakened in the operating room and extubated.  He tolerated the procedure without obvious complications and was transferred to the recovery room for further observation.     Lanelle Bal, MD     EG/MEDQ  D:  09/11/2016  T:  09/11/2016  Job:  [id number]

## 2016-09-12 NOTE — Progress Notes (Signed)
Physical Therapy Session Note  Patient Details  Name: [name] [name] MRN: [id number] Date of Birth: 10-May-1941  Today's Date: 09/12/2016 PT Individual Time: 0826-0926 PT Individual Time Calculation (min): 60 min   Short Term Goals: Week 2:  PT Short Term Goal 1 (Week 2): = LTGs due to anticipated LOS  Skilled Therapeutic Interventions/Progress Updates:    Pt up in chair in room upon arrival. Bed Mobility: supervision with supine<>sit on mat table. Transfers: squat-pivot chair<>w/c min assist repeating multiple time and both directions during session. Increased difficulty going toward Lt side. Cues needed for sequence and w/c setup. Sit<>stand min assist with rw, cues for hand placement(static standing) 60 seconds.   Pt able to propel w/c room<>gym with supervision. Reviewing parts management with pt and family. Pt needing repeated cues. Exercises: QS, Heelslides, SAQ, LAQ, supine hip abd/add (bilateral 1X10). Follow session, pt in w/c in room with family. All needs within reach.   Therapy Documentation Precautions:  Precautions Precautions: Fall Restrictions Weight Bearing Restrictions: Yes RLE Weight Bearing: Weight bearing as tolerated LLE Weight Bearing: Non weight bearing Other Position/Activity Restrictions: bed to chair only for 8 weeks; WBAT RLE for transfers only   Pain: 8/10 but states he is doing okay, monitored during session.   See Function Navigator for Current Functional Status.   Therapy/Group: Individual Therapy  Linard Millers 09/12/2016, 1:38 PM

## 2016-09-12 NOTE — Plan of Care (Signed)
Problem: RH BLADDER ELIMINATION Goal: RH STG MANAGE BLADDER WITH ASSISTANCE STG Manage Bladder With Assistance. Mod  Outcome: Not Progressing Foley

## 2016-09-12 NOTE — Progress Notes (Signed)
      [address]       [place] [address]             [phone]       3 Days Post-Op Procedure(s) (LRB): VIDEO BRONCHOSCOPY WITH ENDOBRONCHIAL ULTRASOUND ([name])  Subjective: Patient awake and just returned from exercise . Major complaint is foley.  He states his breathing is pretty good.  Objective: Vital signs in last 24 hours: Temp:  [98 F (36.7 C)] 98 F (36.7 C) (03/26 0457) Pulse Rate:  [55-64] 64 (03/26 1423) Resp:  [17-18] 18 (03/26 1423) BP: (108-122)/(41-47) 111/43 (03/26 1423) SpO2:  [97 %-100 %] 100 % (03/26 1423)     Intake/Output from previous day: 03/25 0701 - 03/26 0700 In: 720 [P.O.:720] Out: 1950 [Urine:1950]   Physical Exam: alert , up in chair Pulmonary: Clear to auscultation bilaterally   Lab Results: CBC:  Recent Labs  09/11/16 0543 09/12/16 1037  WBC 5.9 6.3  HGB 11.4* 12.5*  HCT 34.9* 38.4*  PLT 338 345   BMET:   Recent Labs  09/10/16 0348 09/12/16 1037  NA  --  132*  K  --  4.0  CL  --  94*  CO2  --  24  GLUCOSE  --  137*  BUN  --  29*  CREATININE 1.03 1.06  CALCIUM  --  9.5    PT/INR:   Recent Labs  09/12/16 1037  LABPROT 16.4*  INR 1.31   ABG:  INR: Will add last result for INR, ABG once components are confirmed Will add last 4 CBG results once components are confirmed  Dg Chest 2 View  Result Date: 09/08/2016 CLINICAL DATA:  Preoperative evaluation.  Hypertension. EXAM: CHEST  2 VIEW COMPARISON:  September 06, 2016 FINDINGS: There are scattered areas of mild scarring. There is no edema or consolidation. Heart is borderline enlarged with pulmonary vascularity within normal limits. Patient is status post coronary artery bypass grafting. There is a left subclavian stent. No adenopathy. There is degenerative change in the thoracic spine. There is aortic atherosclerosis. IMPRESSION: Areas of mild scarring. No edema or consolidation. Heart borderline enlarged. Aortic atherosclerosis. Electronically  Signed   By: Lowella Grip III M.D.   On: 09/08/2016 19:44   Assessment/Plan:  Pulmonary - S/p Bronchoscopy and EBUS. Path still pending this afternoon, perhaps result ob Tuesday   Sonu Kruckenberg B GerhardtPA-C 09/12/2016,2:41 PM

## 2016-09-12 NOTE — Progress Notes (Signed)
Occupational Therapy Session Note  Patient Details  Name: [name] [name] MRN: [id number] Date of Birth: 11-01-1940  Today's Date: 09/12/2016 OT Individual Time: 0945-1100 OT Individual Time Calculation (min): 75 min    Short Term Goals: Week 1:  OT Short Term Goal 1 (Week 1): STGs = LTGs      Skilled Therapeutic Interventions/Progress Updates:     Pt seen for ADL retraining with a focus on following WB precautions, safety, mobility and use of AE. Pt agreeable to a shower. Worked on undressing from w/c, L arm IVs covered. Pt taken to bathroom and set up so he could complete a stand pivot on R leg using B hands on bars with steadying A. Once in shower bathed self including bottom by using lateral leans on bench and long sponge for his feet.  Dried off well and then transferred back to w/c to dress.  A to feed catheter through pant leg and then pt donned pants over feet. Sit to stand to Rw and with min A to support his balance he was able to alternate hands off walker to pull pants over hips.   Discussed use of tub bench at home and before he works on practicing in tub room here, his wife will have bathroom door measured to ensure a w/c will fit through door.  Pt tolerated therapy well with only a few short rest breaks, maintained LLE NWB well, and good general safety awareness. He followed directions well. Pt resting in w/c with spouse in room with him.  Left chair alarm off so she could supervise his arm chair pushups for pressure relief every 20-30 min.    Therapy Documentation Precautions:  Precautions Precautions: Fall Restrictions Weight Bearing Restrictions: Yes RLE Weight Bearing: Weight bearing as tolerated LLE Weight Bearing: Non weight bearing Other Position/Activity Restrictions: bed to chair only for 8 weeks; WBAT RLE for transfers only  Pain: Pain Assessment Pain Assessment: No/denies pain ADL: ADL ADL Comments: refer to functional navigator  See Function Navigator for  Current Functional Status.   Therapy/Group: Individual Therapy  [place] 09/12/2016, 12:27 PM

## 2016-09-12 NOTE — Progress Notes (Signed)
[place] PHYSICAL MEDICINE & REHABILITATION     PROGRESS NOTE  Subjective/Complaints:    ROS: pt denies nausea, vomiting, diarrhea, cough, shortness of breath or chest pain   Objective: Vital Signs: Blood pressure (!) 122/46, pulse (!) 55, temperature 98 F (36.7 C), temperature source Oral, resp. rate 17, height '5\' 6"'$  (1.676 m), weight 84.1 kg (185 lb 6.4 oz), SpO2 97 %. No results found.  Recent Labs  09/11/16 0543  WBC 5.9  HGB 11.4*  HCT 34.9*  PLT 338    Recent Labs  09/10/16 0348  CREATININE 1.03   CBG (last 3)  No results for input(s): GLUCAP in the last 72 hours.  Wt Readings from Last 3 Encounters:  08/31/16 84.1 kg (185 lb 6.4 oz)  08/22/16 71.7 kg (158 lb)  08/18/16 71.8 kg (158 lb 6 oz)    Physical Exam:  BP (!) 122/46 (BP Location: Left Arm)   Pulse (!) 55   Temp 98 F (36.7 C) (Oral)   Resp 17   Ht '5\' 6"'$  (1.676 m)   Wt 84.1 kg (185 lb 6.4 oz)   SpO2 97%   BMI 29.92 kg/m  Constitutional: He appears well-developed and well-nourished.  HENT: Normocephalic and atraumatic.  Eyes: EOMI. No discharge.  Cardiovascular: RRR Respiratory: normal effort GI: Soft. Bowel sounds are normal.  Musculoskeletal: He exhibits edema and tenderness.  GU: Scrotal edema improving.  Neurological: He is alert.  HOH Alert, oriented Motor: RUE 4+/5 proximal to distal LUE: 4/5 proximal to distal RLE: 3-/5 proximal to distal LLE: 3/5 HF, 3+/5 KE, 4-/5 ADF/PF (sl incr) Skin: Skin is warm and dry. Dressing to ORIF remains c/d/i Psychiatric: His mood appears pleasant and appropriate   Assessment/Plan: 1. Functional deficits secondary to closed pelvic ring fracture with retroperitoneal hemorrhage in the left pelvis s/p ORIF of SI after fall which require 3+ hours per day of interdisciplinary therapy in a comprehensive inpatient rehab setting. Physiatrist is providing close team supervision and 24 hour management of active medical problems listed below. Physiatrist  and rehab team continue to assess barriers to discharge/monitor patient progress toward functional and medical goals.  Function:  Bathing Bathing position   Position: Wheelchair/chair at sink  Bathing parts Body parts bathed by patient: Right arm, Left arm, Chest, Abdomen, Front perineal area, Right upper leg, Left upper leg Body parts bathed by helper: Right arm, Left arm, Chest, Abdomen  Bathing assist Assist Level:  (Mod assist)      Upper Body Dressing/Undressing Upper body dressing   What is the patient wearing?: Pull over shirt/dress     Pull over shirt/dress - Perfomed by patient: Thread/unthread right sleeve, Put head through opening, Thread/unthread left sleeve, Pull shirt over trunk          Upper body assist Assist Level: Set up   Set up : To obtain clothing/put away  Lower Body Dressing/Undressing Lower body dressing   What is the patient wearing?: Non-skid slipper socks, Pants     Pants- Performed by patient: Thread/unthread right pants leg, Thread/unthread left pants leg, Pull pants up/down Pants- Performed by helper: Pull pants up/down   Non-skid slipper socks- Performed by helper: Don/doff right sock, Don/doff left sock     Shoes - Performed by patient: Don/doff right shoe, Don/doff left shoe Shoes - Performed by helper: Fasten right, Fasten left          Lower body assist Assist for lower body dressing: Touching or steadying assistance (Pt > 75%)  Toileting Toileting Toileting activity did not occur: No continent bowel/bladder event   Toileting steps completed by helper: Adjust clothing prior to toileting, Performs perineal hygiene, Adjust clothing after toileting    Toileting assist Assist level: Two helpers   Transfers Chair/bed transfer   Chair/bed transfer method: Lateral scoot Chair/bed transfer assist level: Touching or steadying assistance (Pt > 75%) Chair/bed transfer assistive device: Armrests     Locomotion Ambulation  Ambulation activity did not occur: Safety/medical concerns (orders for transfers only)         Wheelchair   Type: Manual Max wheelchair distance: 500 ft Assist Level: Supervision or verbal cues  Cognition Comprehension Comprehension assist level: Follows complex conversation/direction with extra time/assistive device  Expression Expression assist level: Expresses basic needs/ideas: With no assist  Social Interaction Social Interaction assist level: Interacts appropriately 90% of the time - Needs monitoring or encouragement for participation or interaction.  Problem Solving Problem solving assist level: Solves basic 25 - 49% of the time - needs direction more than half the time to initiate, plan or complete simple activities  Memory Memory assist level: Recognizes or recalls 25 - 49% of the time/requires cueing 50 - 75% of the time    Medical Problem List and Plan: 1.  Decreased functional mobility secondary to Closed pelvic ring fracture with disorder of left sacroiliac joint with retroperitoneal hemorrhage in the left side of the pelvis with subcutaneous hemorrhage in the left buttocks after fall status post ORIF with sacroiliac screw fixation of left SI joint 08/25/2016. Weightbearing as tolerated right lower extremity for transfers only nonweightbearing left lower extremity  Cont CIR 2.  Acute DVT:   Subcutaneous Lovenox resumed  Vascular study with b/l peroneal and tibial vein dvt's  Monitor for any bleeding episodes 3. Pain Management: only using tylenol 4. Mood: Provide emotional support 5. Neuropsych: This patient is not capable of making decisions on his own behalf. 6. Skin/Wound Care: Routine skin checks  Nystatin ordered for perineal area, improved 7. Fluids/Electrolytes/Nutrition: Routine I&Os 8. Acute blood loss anemia.   Hb 10.2 on 3/22  Labs ordered for Monday  Cont to monitor 9. Hypertension.   Lopressor decreased to 50 mg twice a day on 3/19, decreased to 37.5 BID  on 3/20, decreased to 12.5 on 3/21  Maxzide 37.5.-25 milligrams daily  Controlled 3/24 10. CAD status post CABG. No chest pain or shortness of breath. Plavix resumed.   On ASA, Coumadin per pharm 11. Atrial fibrillation. Cardiac rate controlled. Continue Lopressor. 12. PVD with multiple revascularization procedures. Continue Plavix. 13. Right middle lobe lung/brain mass.    CTS consulted, bronch/bx completed yesterday, path pending 3/26    14. Scrotal edema with urinary retention  Failed voiding trial, Foley replaced  Bumex started 3/22  Continue Flomax 15. Hypothyroidism. Synthroid 16. Hyperlipidemia. Crestor 17. Constipation. Laxative assistance 18. Hyponatremia  Na+ 132 on 3/22  Labs ordered for3/26  Cont to monitor 19. Hypokalemia  K+ 4.0 on 3/22  Supplementing  Cont to monitor  Labs ordered for 3/26 20. Hypoalbuminemia  Supplement initiated 3/15 21. Sleep disturbance  Trazodone PRN started 3/15 22. Leukocytosis: Resolved 23. Confusion: nearly resolved  Multifactorial ?UTI +/- meds, +/- sleep +/- TBI 24. UTI  Cont abx, to be completed  25. HCAP    Vanc  completed   CXR 3/20  stable 26. Constipation--    Increased meds better per pt  LOS (Days) 12 A FACE TO FACE EVALUATION WAS PERFORMED  Brooklyne Radke E 09/12/2016 7:00 AM

## 2016-09-13 ENCOUNTER — Encounter: Payer: Self-pay | Admitting: *Deleted

## 2016-09-13 ENCOUNTER — Inpatient Hospital Stay (HOSPITAL_COMMUNITY): Payer: Medicare Other | Admitting: Occupational Therapy

## 2016-09-13 ENCOUNTER — Inpatient Hospital Stay (HOSPITAL_COMMUNITY): Payer: Medicare Other | Admitting: Physical Therapy

## 2016-09-13 LAB — PROTIME-INR
INR: 1.35
Prothrombin Time: 16.8 seconds — ABNORMAL HIGH (ref 11.4–15.2)

## 2016-09-13 MED ORDER — BETHANECHOL CHLORIDE 10 MG PO TABS
10.0000 mg | ORAL_TABLET | Freq: Three times a day (TID) | ORAL | Status: DC
  Administered 2016-09-13 (×3): 10 mg via ORAL
  Filled 2016-09-13 (×3): qty 1

## 2016-09-13 MED ORDER — WARFARIN SODIUM 5 MG PO TABS
10.0000 mg | ORAL_TABLET | Freq: Once | ORAL | Status: AC
  Administered 2016-09-13: 10 mg via ORAL
  Filled 2016-09-13: qty 2

## 2016-09-13 NOTE — Progress Notes (Signed)
ANTICOAGULATION CONSULT NOTE - Follow Up Consult  Pharmacy Consult for coumadin and lovenox Indication: DVT  Allergies  Allergen Reactions  . Imitrex [Sumatriptan] Nausea And Vomiting and Other (See Comments)    "made me like I was having a stoke"  . Lipitor [Atorvastatin] Nausea And Vomiting and Other (See Comments)    INTOLERANCE > MYALGIAS "couldn't get out of the bed ( pt had to crawl out of bed ) I hurt Rayanna Matusik bad"    Patient Measurements: Height: '5\' 6"'$  (167.6 cm) Weight: 185 lb 6.4 oz (84.1 kg) IBW/kg (Calculated) : 63.8 Heparin Dosing Weight:   Vital Signs: Temp: 97.8 F (36.6 C) (03/27 0604) Temp Source: Oral (03/27 0604) BP: 112/52 (03/27 0604) Pulse Rate: 73 (03/27 0604)  Labs:  Recent Labs  09/11/16 0543 09/12/16 1037 09/13/16 0317  HGB 11.4* 12.5*  --   HCT 34.9* 38.4*  --   PLT 338 345  --   LABPROT 15.5* 16.4* 16.8*  INR 1.23 1.31 1.35  CREATININE  --  1.06  --     Estimated Creatinine Clearance: 58.4 mL/min (by C-G formula based on SCr of 1.06 mg/dL).   Medications:  Scheduled:  . bethanechol  10 mg Oral TID  . bumetanide  1 mg Oral Daily  . enoxaparin (LOVENOX) injection  85 mg Subcutaneous Q12H  . feeding supplement (ENSURE ENLIVE)  237 mL Oral BID BM  . gabapentin  100 mg Oral QHS  . levothyroxine  112 mcg Oral QAC breakfast  . linaclotide  145 mcg Oral QAC breakfast  . metoprolol  12.5 mg Oral BID  . nystatin   Topical BID  . pantoprazole  40 mg Oral Daily  . polyethylene glycol  17 g Oral BID  . potassium chloride  20 mEq Oral Daily  . rosuvastatin  20 mg Oral q1800  . senna-docusate  2 tablet Oral BID  . sodium chloride  1 spray Each Nare QID  . tamsulosin  0.4 mg Oral Daily  . triamterene-hydrochlorothiazide  1 tablet Oral Daily  . Warfarin - Pharmacist Dosing Inpatient   Does not apply q1800   Infusions:  . lactated ringers 10 mL/hr at 09/09/16 2536    Assessment: 76 yo male with DVT is currently on subtherapeutic coumadin  bridging with treatment dose of lovenox.  INR today is 1.35  (didn't change much) CBC yesterday stable.  Goal of Therapy:  Anti-Xa level 0.6-1 units/ml 4hrs after LMWH dose given; INR 2-3 Monitor platelets by anticoagulation protocol: Yes   Plan:  -Continue Lovenox 85 mg SCq12h -coumadin 10 mg po x1 (may need 7.5 mg most days and 10 mg few days a week) -Daily PT/INR - CBC every 72 hous  Gwynne Kemnitz, Tsz-[place]in 09/13/2016,8:10 AM

## 2016-09-13 NOTE — Progress Notes (Signed)
Oncology Nurse Navigator Documentation  Oncology Nurse Navigator Flowsheets 09/13/2016  Navigator Location [facility]  Navigator Encounter Type Other/per Dr. Julien Nordmann, I requested cytology obtained on 3/23 to be sent for PDL 1 testing   Treatment Phase Pre-Tx/Tx Discussion  Barriers/Navigation Needs Coordination of Care  Interventions Coordination of Care  Coordination of Care Other  Acuity Level 2  Acuity Level 2 Other  Time Spent with Patient 30

## 2016-09-13 NOTE — Progress Notes (Signed)
Social Work Patient ID: [name] [name], male   DOB: 06-Oct-1940, 76 y.o.   MRN: [id number]  Met with pt and wife to discuss equipment needs and follow up needs. All they need is a wheelchair they have all of the rest Since wife's mom has had two hip surgeries. They prefer to used [facility] since they have used them in the past. Referral made for both and therapy educating wife on her husband's needs. May be able to move Up discharge date due to pt doing much better and may reach his goals sooner. Discuss tomorrow in team conference.

## 2016-09-13 NOTE — Progress Notes (Signed)
Physical Therapy Session Note  Patient Details  Name: FRAZER RAINVILLE MRN: [id number] Date of Birth: [birth year]  Today's Date: 09/13/2016 PT Individual Time: 1105-1200 and 1420-1528 PT Individual Time Calculation (min): 55 min and 68 min  Short Term Goals: Week 2:  PT Short Term Goal 1 (Week 2): = LTGs due to anticipated LOS  Skilled Therapeutic Interventions/Progress Updates:   Treatment 1: Patient in wheelchair upon arrival with family in room. Patient propelled wheelchair throughout rehab unit using BUE support with supervision. Significant time spent reviewing donning/doffing B elevating leg rests  With patient performing x 2 with supervision and max verbal cues for technique with no immediate carryover noted. Focus on family training with patient and wife for wheelchair setup in preparation for transfers, wife performing wheelchair parts management, squat pivot transfers from wheelchair <> mat table and wheelchair <> regular bed in ADL apartment to simulate home environment, and sit <> supine on mat table and regular bed to R with wife providing appropriate return demonstration with supervision and verbal cues for proper body mechanics. Patient's daughter asking about best car to take patient home in, plan to practice car transfers this afternoon. Patient left sitting in wheelchair with family in room and all needs in reach.    Treatment 2: Session focused on continued family training with patient and wife for simulated car transfer to sedan and midsize SUV height via stand pivot using RW with wife providing appropriate cues and steady assist and squat pivot transfer from wheelchair to and from low compliant couch surface with wife providing supervision and increased time including wheelchair setup. Patient propelled wheelchair throughout rehab unit with supervision and increased time. Patient propelled wheelchair up/down ramp x 2 with min A progressed to supervision. Sit <> stand transfers using  RW with supervision and max verbal cues for safe hand placement. To challenge standing balance and endurance, patient completed 2 trials of 3 min on [employer] using RUE for first trial with score of 60 hits and LUE for second trial with score of 61 hits with steady assist for single UE support balance. Patient able to maintain NWB status without cues. Patient instructed in seated BLE LAQ x 20 and wheelchair pushups 2 x 10 with RLE supported. Patient left sitting in wheelchair with all needs within reach and wife present.   Therapy Documentation Precautions:  Precautions Precautions: Fall Restrictions Weight Bearing Restrictions: Yes RLE Weight Bearing: Weight bearing as tolerated LLE Weight Bearing: Non weight bearing Other Position/Activity Restrictions: bed to chair only for 8 weeks; WBAT RLE for transfers only Pain: Pain Assessment Pain Assessment: No/denies pain  See Function Navigator for Current Functional Status.   Therapy/Group: Individual Therapy  Danese Dorsainvil, Murray Hodgkins 09/13/2016, 12:04 PM

## 2016-09-13 NOTE — Progress Notes (Signed)
[place] PHYSICAL MEDICINE & REHABILITATION     PROGRESS NOTE  Subjective/Complaints:  Appreciate CVTS note  ROS: pt denies nausea, vomiting, diarrhea, cough, shortness of breath or chest pain   Objective: Vital Signs: Blood pressure (!) 112/52, pulse 73, temperature 97.8 F (36.6 C), temperature source Oral, resp. rate 17, height '5\' 6"'$  (1.676 m), weight 84.1 kg (185 lb 6.4 oz), SpO2 97 %. No results found.  Recent Labs  09/11/16 0543 09/12/16 1037  WBC 5.9 6.3  HGB 11.4* 12.5*  HCT 34.9* 38.4*  PLT 338 345    Recent Labs  09/12/16 1037  NA 132*  K 4.0  CL 94*  GLUCOSE 137*  BUN 29*  CREATININE 1.06  CALCIUM 9.5   CBG (last 3)  No results for input(s): GLUCAP in the last 72 hours.  Wt Readings from Last 3 Encounters:  08/31/16 84.1 kg (185 lb 6.4 oz)  08/22/16 71.7 kg (158 lb)  08/18/16 71.8 kg (158 lb 6 oz)    Physical Exam:  BP (!) 112/52 (BP Location: Left Arm)   Pulse 73   Temp 97.8 F (36.6 C) (Oral)   Resp 17   Ht '5\' 6"'$  (1.676 m)   Wt 84.1 kg (185 lb 6.4 oz)   SpO2 97%   BMI 29.92 kg/m  Constitutional: He appears well-developed and well-nourished.  HENT: Normocephalic and atraumatic.  Eyes: EOMI. No discharge.  Cardiovascular: RRR Respiratory: normal effort GI: Soft. Bowel sounds are normal.  Musculoskeletal: He exhibits edema and tenderness.   Neurological: He is alert.  HOH Alert, oriented Motor: RUE 4+/5 proximal to distal LUE: 4/5 proximal to distal RLE: 3-/5 proximal to distal LLE: 3/5 HF, 3+/5 KE, 4-/5 ADF/PF (sl incr) Skin: Skin is warm and dry. Dressing to ORIF remains c/d/i Psychiatric: His mood appears pleasant and appropriate   Assessment/Plan: 1. Functional deficits secondary to closed pelvic ring fracture with retroperitoneal hemorrhage in the left pelvis s/p ORIF of SI after fall which require 3+ hours per day of interdisciplinary therapy in a comprehensive inpatient rehab setting. Physiatrist is providing close team  supervision and 24 hour management of active medical problems listed below. Physiatrist and rehab team continue to assess barriers to discharge/monitor patient progress toward functional and medical goals.  Function:  Bathing Bathing position   Position: Shower  Bathing parts Body parts bathed by patient: Right arm, Left arm, Chest, Abdomen, Front perineal area, Right upper leg, Left upper leg, Buttocks, Right lower leg, Left lower leg Body parts bathed by helper: Back  Bathing assist Assist Level:  (Mod assist)      Upper Body Dressing/Undressing Upper body dressing   What is the patient wearing?: Pull over shirt/dress     Pull over shirt/dress - Perfomed by patient: Thread/unthread right sleeve, Put head through opening, Thread/unthread left sleeve, Pull shirt over trunk          Upper body assist Assist Level: Set up   Set up : To obtain clothing/put away  Lower Body Dressing/Undressing Lower body dressing   What is the patient wearing?: Non-skid slipper socks, Pants, Shoes     Pants- Performed by patient: Thread/unthread right pants leg, Thread/unthread left pants leg, Pull pants up/down Pants- Performed by helper: Pull pants up/down   Non-skid slipper socks- Performed by helper: Don/doff right sock, Don/doff left sock     Shoes - Performed by patient: Don/doff right shoe, Don/doff left shoe Shoes - Performed by helper: Don/doff right shoe, Don/doff left shoe, Fasten right, [employer]  left          Lower body assist Assist for lower body dressing: Touching or steadying assistance (Pt > 75%)      Toileting Toileting Toileting activity did not occur: No continent bowel/bladder event   Toileting steps completed by helper: Adjust clothing prior to toileting, Performs perineal hygiene, Adjust clothing after toileting    Toileting assist Assist level: Two helpers   Transfers Chair/bed transfer   Chair/bed transfer method: Squat pivot Chair/bed transfer assist level:  Touching or steadying assistance (Pt > 75%) Chair/bed transfer assistive device: Armrests     Locomotion Ambulation Ambulation activity did not occur: Safety/medical concerns (orders for transfers only)         Wheelchair   Type: Manual Max wheelchair distance: 150' Assist Level: Supervision or verbal cues  Cognition Comprehension Comprehension assist level: Follows complex conversation/direction with extra time/assistive device  Expression Expression assist level: Expresses basic needs/ideas: With no assist  Social Interaction Social Interaction assist level: Interacts appropriately 90% of the time - Needs monitoring or encouragement for participation or interaction.  Problem Solving Problem solving assist level: Solves basic 25 - 49% of the time - needs direction more than half the time to initiate, plan or complete simple activities  Memory Memory assist level: Recognizes or recalls 25 - 49% of the time/requires cueing 50 - 75% of the time    Medical Problem List and Plan: 1.  Decreased functional mobility secondary to Closed pelvic ring fracture with disorder of left sacroiliac joint with retroperitoneal hemorrhage in the left side of the pelvis with subcutaneous hemorrhage in the left buttocks after fall status post ORIF with sacroiliac screw fixation of left SI joint 08/25/2016. Weightbearing as tolerated right lower extremity for transfers only nonweightbearing left lower extremity  Cont CIR Team conf in am 2.  Acute DVT:   Subcutaneous Lovenox resumed  Vascular study with b/l peroneal and tibial vein dvt's  Monitor for any bleeding episodes 3. Pain Management: only using tylenol 4. Mood: Provide emotional support 5. Neuropsych: This patient is not capable of making decisions on his own behalf. 6. Skin/Wound Care: Routine skin checks  Nystatin ordered for perineal area, improved 7. Fluids/Electrolytes/Nutrition: Routine I&Os 8. Acute blood loss anemia. Improving  Hb 12.5 on  3/27    Cont to monitor 9. Hypertension.   Lopressor decreased to 50 mg twice a day on 3/19, decreased to 37.5 BID on 3/20, decreased to 12.5 on 3/21  Maxzide 37.5.-25 milligrams daily  Controlled 3/24 10. CAD status post CABG. No chest pain or shortness of breath. Plavix resumed.   On ASA, Coumadin per pharm 11. Atrial fibrillation. Cardiac rate controlled. Continue Lopressor. 12. PVD with multiple revascularization procedures. Continue Plavix. 13. Right middle lobe lung/brain mass.    CTS consulted, bronch/bx completed yesterday, path pending 3/26    14. Scrotal edema with urinary retention  Failed voiding trial, Foley replaced  Bumex started 3/22  Continue Flomax 15. Hypothyroidism. Synthroid 16. Hyperlipidemia. Crestor 17. Constipation. Laxative assistance 18. Hyponatremia  Na+ 132 on 3/26   Cont to monitor 19. Hypokalemia- resolved  K+ 4.0 on 3/26  Supplementing   20. Hypoalbuminemia  Supplement initiated 3/15 21. Sleep disturbance  Trazodone PRN started 3/15 22. Leukocytosis: Resolved 23. Confusion: nearly resolved  Multifactorial ?UTI +/- meds, +/- sleep +/- TBI 24. UTI resolved 25. HCAP- resolved    Vanc  completed   CXR 3/20  stable 26. Constipation--    Increased meds better per pt  LOS (Days)  [place] EVALUATION WAS PERFORMED  Alysia Penna E 09/13/2016 7:02 AM

## 2016-09-13 NOTE — Plan of Care (Signed)
Problem: RH Bathing Goal: LTG Patient will bathe with assist, cues/equipment (OT) LTG: Patient will bathe specified number of body parts with assist with/without cues using equipment (position)  (OT)  Upgraded from min A to S   Problem: RH Dressing Goal: LTG Patient will perform lower body dressing w/assist (OT) LTG: Patient will perform lower body dressing with assist, with/without cues in positioning using equipment (OT)  Upgraded from mod A to min A with footwear  Problem: RH Toileting Goal: LTG Patient will perform toileting w/assist, cues/equip (OT) LTG: Patient will perform toiletiing (clothes management/hygiene) with assist, with/without cues using equipment (OT)  Upgraded from mod A to min A

## 2016-09-13 NOTE — Progress Notes (Signed)
Occupational Therapy Session Note  Patient Details  Name: [name] [name] MRN: [id number] Date of Birth: [birth year]  Today's Date: 09/13/2016 OT Individual Time: 0935-1030 OT Individual Time Calculation (min): 55 min    Short Term Goals: Week 1:  OT Short Term Goal 1 (Week 1): STGs = LTGs   Problem: RH Bathing Goal: LTG Patient will bathe with assist, cues/equipment (OT) LTG: Patient will bathe specified number of body parts with assist with/without cues using equipment (position)  (OT)  Upgraded from min A to S   Problem: RH Dressing Goal: LTG Patient will perform lower body dressing w/assist (OT) LTG: Patient will perform lower body dressing with assist, with/without cues in positioning using equipment (OT)  Upgraded from mod A to min A with footwear  Problem: RH Toileting Goal: LTG Patient will perform toileting w/assist, cues/equip (OT) LTG: Patient will perform toiletiing (clothes management/hygiene) with assist, with/without cues using equipment (OT)  Upgraded from mod A to min A   Skilled Therapeutic Interventions/Progress Updates:      Pt seen for BADL retraining of bathing and dressing with a focus on use of AE, safe transfers and sit to stand skills with RW.  Pt completed squat pivot transfers w/c to tub bench with supervision and sit to stand to RW with steadying A to complete bathing with S using a long handled sponge and dressing using a sock aid.  He can now don pants over feet without a reacher. He could benefit from elastic shoe laces. His activity tolerance has improved a great deal.  Discussed tub bench with spouse again and she stated they were able to obtain one.  Pt has 2 BSC at home also.  Pt resting in w/c with family in the room with pt.  Therapy Documentation Precautions:  Precautions Precautions: Fall Restrictions Weight Bearing Restrictions: Yes RLE Weight Bearing: Weight bearing as tolerated LLE Weight Bearing: Non weight bearing Other  Position/Activity Restrictions: bed to chair only for 8 weeks; WBAT RLE for transfers only  Pain: Pain Assessment Pain Assessment: No/denies pain Pain Score: Asleep ADL: ADL ADL Comments: refer to functional navigator     See Function Navigator for Current Functional Status.   Therapy/Group: Individual Therapy  SAGUIER,JULIA 09/13/2016, 11:02 AM

## 2016-09-14 ENCOUNTER — Inpatient Hospital Stay (HOSPITAL_COMMUNITY): Payer: Medicare Other | Admitting: Physical Therapy

## 2016-09-14 ENCOUNTER — Telehealth: Payer: Self-pay | Admitting: Internal Medicine

## 2016-09-14 ENCOUNTER — Inpatient Hospital Stay (HOSPITAL_COMMUNITY): Payer: Medicare Other

## 2016-09-14 ENCOUNTER — Encounter (HOSPITAL_COMMUNITY): Payer: Self-pay

## 2016-09-14 ENCOUNTER — Encounter: Payer: Self-pay | Admitting: Internal Medicine

## 2016-09-14 DIAGNOSIS — R590 Localized enlarged lymph nodes: Secondary | ICD-10-CM

## 2016-09-14 LAB — PROTIME-INR
INR: 1.57
Prothrombin Time: 18.9 seconds — ABNORMAL HIGH (ref 11.4–15.2)

## 2016-09-14 LAB — CBC
HCT: 36.2 % — ABNORMAL LOW (ref 39.0–52.0)
Hemoglobin: 12.1 g/dL — ABNORMAL LOW (ref 13.0–17.0)
MCH: 29.7 pg (ref 26.0–34.0)
MCHC: 33.4 g/dL (ref 30.0–36.0)
MCV: 88.9 fL (ref 78.0–100.0)
PLATELETS: 270 10*3/uL (ref 150–400)
RBC: 4.07 MIL/uL — AB (ref 4.22–5.81)
RDW: 15.8 % — ABNORMAL HIGH (ref 11.5–15.5)
WBC: 5.2 10*3/uL (ref 4.0–10.5)

## 2016-09-14 MED ORDER — TAMSULOSIN HCL 0.4 MG PO CAPS
0.8000 mg | ORAL_CAPSULE | Freq: Every day | ORAL | Status: DC
  Administered 2016-09-14 – 2016-09-15 (×2): 0.8 mg via ORAL
  Filled 2016-09-14 (×3): qty 2

## 2016-09-14 MED ORDER — ENSURE ENLIVE PO LIQD
237.0000 mL | Freq: Every day | ORAL | Status: DC
  Administered 2016-09-15: 237 mL via ORAL

## 2016-09-14 MED ORDER — BETHANECHOL CHLORIDE 25 MG PO TABS
25.0000 mg | ORAL_TABLET | Freq: Three times a day (TID) | ORAL | Status: DC
  Administered 2016-09-14 – 2016-09-15 (×6): 25 mg via ORAL
  Filled 2016-09-14 (×7): qty 1

## 2016-09-14 MED ORDER — WARFARIN SODIUM 5 MG PO TABS
10.0000 mg | ORAL_TABLET | Freq: Once | ORAL | Status: AC
  Administered 2016-09-14: 10 mg via ORAL
  Filled 2016-09-14: qty 2

## 2016-09-14 MED ORDER — LIDOCAINE HCL 2 % EX GEL
1.0000 "application " | CUTANEOUS | Status: DC | PRN
  Filled 2016-09-14: qty 5

## 2016-09-14 NOTE — Progress Notes (Signed)
Occupational Therapy Session Note  Patient Details  Name: [name] [name] MRN: [id number] Date of Birth: 1941-03-11  Today's Date: 09/14/2016 OT Individual Time: 1030-1155 OT Individual Time Calculation (min): 85 min    Short Term Goals: Week 1:  OT Short Term Goal 1 (Week 1): STGs = LTGs  Skilled Therapeutic Interventions/Progress Updates:    Pt resting in w/c upon arrival and agreeable to engaging in BADL retraining including bathing at shower level and dressing with sit<>stand from w/c at sink. Pt performed w/c<>tub bench transfer using grab bars at supervision level and completed all bathing tasks without assistance using lateral leans to bathe buttocks.  Pt required steady A when standing to pull up pants.  Pt requires more than a reasonable amount of time to complete tasks.  Pt completed grooming tasks seated in w/c at sink.  Pt practiced placing leg rest on w/c and completed without assistance but required extra time.  Pt propelled to therapy gym and engaged in BUE therex on Scif[employer] (6 mins X 2 at work load 3).  Pt's MD arrived in room to inform pt that biopsy of chest lymph nodes were positive for cancer and an appointment had been made with Oncology MD for the following Tuesday.  Pt remained in w/c awaiting lunch with family present.   Therapy Documentation Precautions:  Precautions Precautions: Fall Restrictions Weight Bearing Restrictions: Yes RLE Weight Bearing: Weight bearing as tolerated LLE Weight Bearing: Non weight bearing Other Position/Activity Restrictions: bed to chair only for 8 weeks; WBAT RLE for transfers only  Pain:  Pt denied pain ADL: ADL ADL Comments: refer to functional navigator  See Function Navigator for Current Functional Status.   Therapy/Group: Individual Therapy  Leroy Libman 09/14/2016, 12:13 PM

## 2016-09-14 NOTE — Progress Notes (Signed)
Social Work Patient ID: [name] [name], male   DOB: [birth year], 76 y.o.   MRN: [id number]  Met with pt and wife to discuss team conference progress toward his goals and discharge. Team has moved up discharge to Friday. He will reach his goals by then. The hope is he will begin voiding MD has increased his bladder medicines and is hopeful he will void, if not will go home with a foley. Both agreeable to this plan and have all needed Equipment.

## 2016-09-14 NOTE — Progress Notes (Signed)
Nutrition Follow-up  DOCUMENTATION CODES:   Not applicable  INTERVENTION:  Continue Ensure Enlive po once daily, each supplement provides 350 kcal and 20 grams of protein.  Encourage adequate PO intake.   NUTRITION DIAGNOSIS:   Increased nutrient needs related to  (therapy) as evidenced by estimated needs; ongoing  GOAL:   Patient will meet greater than or equal to 90% of their needs; met  MONITOR:   PO intake, Supplement acceptance, Labs, Weight trends, Skin, I & O's  REASON FOR ASSESSMENT:   Malnutrition Screening Tool    ASSESSMENT:   76 y.o. male with history of PVD with multiple revascularization procedures, left carotid surgery, CAD status post CABG, atrial fibrillation maintained on aspirin and Plavix. 08/22/2016 after a fall from a ladder after attempting to clean out his stove pipe.  CT abdomen and pelvis showed disruption of the symphysis pubis and left sacroiliac joint with retroperitoneal hemorrhage in the left side of the pelvis and subcutaneous hemorrhage in the left buttocks as well as fractures left transverse process of L2-S1. Also with incidental findings of spiculated mass in the right middle lobe consistent with carcinoma of the lung with metastatic adenopathy in the subcarinal region. Underwent open reduction internal fixation of pubic symphysis with sacroiliac screw fixation of left SI joint 08/25/2016  Meal completion has been 50-100% with most recent meal completion at 75-100%. Appetite improving. Pt currently has Ensure ordered and has been consuming them. RD to modify orders to provide Ensure once daily as intake has improved.   Labs and medications reviewed.   Diet Order:  Diet regular Room service appropriate? Yes; Fluid consistency: Thin  Skin:  Wound (see comment) (Incision on abdomen and L hip)  Last BM:  3/28  Height:   Ht Readings from Last 1 Encounters:  08/31/16 '5\' 6"'  (1.676 m)    Weight:   Wt Readings from Last 1 Encounters:   08/31/16 185 lb 6.4 oz (84.1 kg)    Ideal Body Weight:  59 kg  BMI:  Body mass index is 29.92 kg/m.  Estimated Nutritional Needs:   Kcal:  1800-2000  Protein:  85-95 gramss  Fluid:  1.8 - 2 L/day  EDUCATION NEEDS:   No education needs identified at this time  Corrin Parker, MS, RD, LDN Pager # [phone] After hours/ weekend pager # [phone]

## 2016-09-14 NOTE — Progress Notes (Signed)
Physical Therapy Session Note  Patient Details  Name: [name] [name] MRN: [id number] Date of Birth: 10-04-1940  Today's Date: 09/14/2016 PT Individual Time: 2778-2423 PT Individual Time Calculation (min): 58 min   Short Term Goals: Week 2:  PT Short Term Goal 1 (Week 2): = LTGs due to anticipated LOS  Skilled Therapeutic Interventions/Progress Updates:   Patient resting in bed with wife present for session. Focus on supine > sit with mod I, stand pivot transfers x using RW bed <> wheelchair and x 1 using arm rests wheelchair <> outdoor bench with supervision and assist for wheelchair setup, community wheelchair propulsion using BUE throughout hospital, on/off elevators, over thresholds, and outdoors on uneven brick and concrete surfaces with supervision, and adjusting personal manual wheelchair and elevating leg rests for improved sitting tolerance. Demonstrated wheelchair parts management and lifting wheelchair in/out of car to wife who verbalized understanding. Patient left sitting in wheelchair with wife in room.   Therapy Documentation Precautions:  Precautions Precautions: Fall Restrictions Weight Bearing Restrictions: Yes RLE Weight Bearing: Weight bearing as tolerated LLE Weight Bearing: Non weight bearing Other Position/Activity Restrictions: bed to chair only for 8 weeks; WBAT RLE for transfers only Pain: Pain Assessment Pain Assessment: No/denies pain   See Function Navigator for Current Functional Status.   Therapy/Group: Individual Therapy  Kairi Tufo, Murray Hodgkins 09/14/2016, 4:19 PM

## 2016-09-14 NOTE — Progress Notes (Signed)
[facility] PHYSICAL MEDICINE & REHABILITATION     PROGRESS NOTE  Subjective/Complaints:  Requiring cath for all voids except for 1  ROS: pt denies nausea, vomiting, diarrhea, cough, shortness of breath or chest pain   Objective: Vital Signs: Blood pressure (!) 123/43, pulse (!) 52, temperature 98 F (36.7 C), temperature source Oral, resp. rate 17, height '5\' 6"'  (1.676 m), weight 84.1 kg (185 lb 6.4 oz), SpO2 98 %. No results found.  Recent Labs  09/12/16 1037 09/14/16 0545  WBC 6.3 5.2  HGB 12.5* 12.1*  HCT 38.4* 36.2*  PLT 345 270    Recent Labs  09/12/16 1037  NA 132*  K 4.0  CL 94*  GLUCOSE 137*  BUN 29*  CREATININE 1.06  CALCIUM 9.5   CBG (last 3)  No results for input(s): GLUCAP in the last 72 hours.  Wt Readings from Last 3 Encounters:  08/31/16 84.1 kg (185 lb 6.4 oz)  08/22/16 71.7 kg (158 lb)  08/18/16 71.8 kg (158 lb 6 oz)    Physical Exam:  BP (!) 123/43 (BP Location: Left Arm)   Pulse (!) 52   Temp 98 F (36.7 C) (Oral)   Resp 17   Ht '5\' 6"'  (1.676 m)   Wt 84.1 kg (185 lb 6.4 oz)   SpO2 98%   BMI 29.92 kg/m  Constitutional: He appears well-developed and well-nourished.  HENT: Normocephalic and atraumatic.  Eyes: EOMI. No discharge.  Cardiovascular: RRR Respiratory: normal effort GI: Soft. Bowel sounds are normal.  Musculoskeletal: He exhibits edema and tenderness.   Neurological: He is alert.  HOH Alert, oriented Motor: RUE 4+/5 proximal to distal LUE: 4/5 proximal to distal RLE: 3-/5 proximal to distal LLE: 3/5 HF, 3+/5 KE, 4-/5 ADF/PF (sl incr) Skin: Skin is warm and dry.  Psychiatric: His mood appears pleasant and appropriate   Assessment/Plan: 1. Functional deficits secondary to closed pelvic ring fracture with retroperitoneal hemorrhage in the left pelvis s/p ORIF of SI after fall which require 3+ hours per day of interdisciplinary therapy in a comprehensive inpatient rehab setting. Physiatrist is providing close team  supervision and 24 hour management of active medical problems listed below. Physiatrist and rehab team continue to assess barriers to discharge/monitor patient progress toward functional and medical goals.  Function:  Bathing Bathing position   Position: Shower  Bathing parts Body parts bathed by patient: Right arm, Left arm, Chest, Abdomen, Front perineal area, Right upper leg, Left upper leg, Buttocks, Right lower leg, Left lower leg, Back Body parts bathed by helper: Back  Bathing assist Assist Level:  (Mod assist)      Upper Body Dressing/Undressing Upper body dressing   What is the patient wearing?: Pull over shirt/dress     Pull over shirt/dress - Perfomed by patient: Thread/unthread right sleeve, Put head through opening, Thread/unthread left sleeve, Pull shirt over trunk          Upper body assist Assist Level: Set up   Set up : To obtain clothing/put away  Lower Body Dressing/Undressing Lower body dressing   What is the patient wearing?: Pants, Shoes, Socks     Pants- Performed by patient: Thread/unthread right pants leg, Thread/unthread left pants leg, Pull pants up/down Pants- Performed by helper: Pull pants up/down   Non-skid slipper socks- Performed by helper: Don/doff right sock, Don/doff left sock Socks - Performed by patient: Don/doff right sock, Don/doff left sock   Shoes - Performed by patient: Don/doff right shoe Shoes - Performed by helper: Fasten  right          Lower body assist Assist for lower body dressing: Touching or steadying assistance (Pt > 75%)      Toileting Toileting Toileting activity did not occur: No continent bowel/bladder event   Toileting steps completed by helper: Adjust clothing prior to toileting, Performs perineal hygiene, Adjust clothing after toileting    Toileting assist Assist level: Two helpers   Transfers Chair/bed transfer   Chair/bed transfer method: Squat pivot Chair/bed transfer assist level: Supervision or  verbal cues Chair/bed transfer assistive device: Armrests     Locomotion Ambulation Ambulation activity did not occur: Safety/medical concerns (orders for transfers only)         Wheelchair   Type: Manual Max wheelchair distance: 150' Assist Level: Supervision or verbal cues  Cognition Comprehension Comprehension assist level: Follows complex conversation/direction with extra time/assistive device  Expression Expression assist level: Expresses complex ideas: With no assist  Social Interaction Social Interaction assist level: Interacts appropriately 90% of the time - Needs monitoring or encouragement for participation or interaction.  Problem Solving Problem solving assist level: Solves basic 50 - 74% of the time/requires cueing 25 - 49% of the time  Memory Memory assist level: Recognizes or recalls 50 - 74% of the time/requires cueing 25 - 49% of the time    Medical Problem List and Plan: 1.  Decreased functional mobility secondary to Closed pelvic ring fracture with disorder of left sacroiliac joint with retroperitoneal hemorrhage in the left side of the pelvis with subcutaneous hemorrhage in the left buttocks after fall status post ORIF with sacroiliac screw fixation of left SI joint 08/25/2016. Weightbearing as tolerated right lower extremity for transfers only nonweightbearing left lower extremity  Cont CIR Team conference today please see physician documentation under team conference tab, met with team face-to-face to discuss problems,progress, and goals. Formulized individual treatment plan based on medical history, underlying problem and comorbidities. 2.  Acute DVT:   Subcutaneous Lovenox resumed  Vascular study with b/l peroneal and tibial vein dvt's  Monitor for any bleeding episodes 3. Pain Management: only using tylenol 4. Mood: Provide emotional support 5. Neuropsych: This patient is not capable of making decisions on his own behalf. 6. Skin/Wound Care: Routine skin  checks  Nystatin ordered for perineal area, improved 7. Fluids/Electrolytes/Nutrition: Routine I&Os 8. Acute blood loss anemia. Improving  Hb 12.5 on 3/27    Cont to monitor 9. Hypertension.   Lopressor decreased to 50 mg twice a day on 3/19, decreased to 37.5 BID on 3/20, decreased to 12.5 on 3/21  Maxzide 37.5.-25 milligrams daily  Controlled 3/24 10. CAD status post CABG. No chest pain or shortness of breath. Plavix resumed.   On ASA, Coumadin per pharm 11. Atrial fibrillation. Cardiac rate controlled. Continue Lopressor. 12. PVD with multiple revascularization procedures. Continue Plavix. 13. Right middle lobe lung/brain mass.    CTS consulted, bronch/bx completed yesterday, path pending 3/26    14. Scrotal edema with urinary retention  Failed voiding trial, Foley replaced  Bumex started 3/22  Continue Flomax 15. Hypothyroidism. Synthroid 16. Hyperlipidemia. Crestor 17. Constipation. Laxative assistance 18. Hyponatremia  Na+ 132 on 3/26   Cont to monitor 19. Hypokalemia- resolved  K+ 4.0 on 3/26  Supplementing   20. Hypoalbuminemia  Supplement initiated 3/15 21. Sleep disturbance  Trazodone PRN started 3/15 22. Leukocytosis: Resolved 23. Confusion: nearly resolved  Multifactorial ?UTI +/- meds, +/- sleep +/- TBI 24. Urinary retention increase flomax and urecholine 25. HCAP- resolved    Vanc  completed   CXR 3/20  stable 26. Constipation--   resolved  LOS (Days) 14 A FACE TO FACE EVALUATION WAS PERFORMED  Charlett Blake 09/14/2016 7:36 AM

## 2016-09-14 NOTE — Progress Notes (Signed)
Physical Therapy Session Note  Patient Details  Name: [name] [name] MRN: [id number] Date of Birth: 05-12-1941  Today's Date: 09/14/2016 PT Individual Time: 0913-1008 PT Individual Time Calculation (min): 55 min    Skilled Therapeutic Interventions/Progress Updates:    Session today consisted of improving independence and safety with functional mobility.  Pt propelled wheelchair on hard surface, carpeted surface, into and out of bathroom and into kitchen of apartment.  Pt required min assist only for safe propulsion of wheelchair into bathroom doorway.  Further treatment today focused on utilizing errorless learning techniques to improve recall with armrest use/removal with transfers to bed and back to chair.  Pt requires some verbal cueing to remember to put arm rest back down after transfers.  Pt additionally requires verbal cues to maintain weight bearing status with L LE during bed mobility.  Pt returned back to room to await next appt with OT.  Pt wife in room and pt left up in chair.  Pt denied pain today.   Therapy Documentation Precautions:  Precautions Precautions: Fall Restrictions Weight Bearing Restrictions: Yes RLE Weight Bearing: Weight bearing as tolerated LLE Weight Bearing: Non weight bearing Other Position/Activity Restrictions: bed to chair only for 8 weeks; WBAT RLE for transfers only   See Function Navigator for Current Functional Status.   Therapy/Group: Individual Therapy  [name] Hilario Quarry 09/14/2016, 10:13 AM

## 2016-09-14 NOTE — Progress Notes (Signed)
ANTICOAGULATION CONSULT NOTE - Follow Up Consult  Pharmacy Consult for coumadin and lovenox Indication: DVT  Allergies  Allergen Reactions  . Imitrex [Sumatriptan] Nausea And Vomiting and Other (See Comments)    "made me like I was having a stoke"  . Lipitor [Atorvastatin] Nausea And Vomiting and Other (See Comments)    INTOLERANCE > MYALGIAS "couldn't get out of the bed ( pt had to crawl out of bed ) I hurt so bad"    Patient Measurements: Height: '5\' 6"'$  (167.6 cm) Weight: 185 lb 6.4 oz (84.1 kg) IBW/kg (Calculated) : 63.8 Heparin Dosing Weight:   Vital Signs: Temp: 98 F (36.7 C) (03/28 0535) Temp Source: Oral (03/28 0535) BP: 123/43 (03/28 0535) Pulse Rate: 52 (03/28 0535)  Labs:  Recent Labs  09/12/16 1037 09/13/16 0317 09/14/16 0545  HGB 12.5*  --  12.1*  HCT 38.4*  --  36.2*  PLT 345  --  270  LABPROT 16.4* 16.8* 18.9*  INR 1.31 1.35 1.57  CREATININE 1.06  --   --     Estimated Creatinine Clearance: 58.4 mL/min (by C-G formula based on SCr of 1.06 mg/dL).   Medications:  Scheduled:  . bethanechol  25 mg Oral TID  . bumetanide  1 mg Oral Daily  . enoxaparin (LOVENOX) injection  85 mg Subcutaneous Q12H  . feeding supplement (ENSURE ENLIVE)  237 mL Oral BID BM  . gabapentin  100 mg Oral QHS  . levothyroxine  112 mcg Oral QAC breakfast  . linaclotide  145 mcg Oral QAC breakfast  . metoprolol  12.5 mg Oral BID  . nystatin   Topical BID  . pantoprazole  40 mg Oral Daily  . polyethylene glycol  17 g Oral BID  . potassium chloride  20 mEq Oral Daily  . rosuvastatin  20 mg Oral q1800  . senna-docusate  2 tablet Oral BID  . sodium chloride  1 spray Each Nare QID  . tamsulosin  0.8 mg Oral Daily  . triamterene-hydrochlorothiazide  1 tablet Oral Daily  . Warfarin - Pharmacist Dosing Inpatient   Does not apply q1800   Infusions:  . lactated ringers 10 mL/hr at 09/09/16 6644    Assessment: 76 yo male with DVT is currently on subtherapeutic coumadin  bridging with treatment dose of lovenox.  INR today is 1.35 > 1.57, CBC stable. Crcl ~ 60 ml/min  Goal of Therapy:  Anti-Xa level 0.6-1 units/ml 4hrs after LMWH dose given; INR 2-3 Monitor platelets by anticoagulation protocol: Yes   Plan:  -Continue Lovenox 85 mg SCq12h -Coumadin 10 mg po x1 (may need 7.5 mg most days and 10 mg few days a week) -Daily PT/INR - CBC every 72 hous  Maryanna Shape, PharmD, BCPS  Clinical Pharmacist  Pager: [phone]  09/14/2016,9:08 AM

## 2016-09-14 NOTE — Progress Notes (Signed)
      [address]       [place] [address]             [phone]       5 Days Post-Op Procedure(s) (LRB): VIDEO BRONCHOSCOPY WITH ENDOBRONCHIAL ULTRASOUND ([name])  Subjective: Patient awake and just returned from exercise .   He states his breathing is pretty good.  Objective: Vital signs in last 24 hours: Temp:  [97.8 F (36.6 C)-98 F (36.7 C)] 98 F (36.7 C) (03/28 0535) Pulse Rate:  [52-67] 52 (03/28 0535) Resp:  [17-18] 17 (03/28 0535) BP: (123)/(43-49) 123/43 (03/28 0535) SpO2:  [98 %] 98 % (03/28 0535)     Intake/Output from previous day: 03/27 0701 - 03/28 0700 In: 1200 [P.O.:1200] Out: 2056 [Urine:2056]   Physical Exam: alert , up in chair Pulmonary: Clear to auscultation bilaterally   Lab Results: CBC:  Recent Labs  09/12/16 1037 09/14/16 0545  WBC 6.3 5.2  HGB 12.5* 12.1*  HCT 38.4* 36.2*  PLT 345 270   BMET:   Recent Labs  09/12/16 1037  NA 132*  K 4.0  CL 94*  CO2 24  GLUCOSE 137*  BUN 29*  CREATININE 1.06  CALCIUM 9.5    PT/INR:   Recent Labs  09/14/16 0545  LABPROT 18.9*  INR 1.57   Path: Adequacy Reason Satisfactory For Evaluation. Diagnosis FINE NEEDLE ASPIRATION, EBUS 7, A (SPECIMEN 1 OF 1, COLLECTED 09/09/16): MALIGNANT CELLS PRESENT, CONSISTENT WITH ADENOCARCINOMA. SEE COMMENT. COMMENT: DR. Vicente Males HAS REVIEWED THE CASE AND CONCURS WITH THIS INTERPRETATION. THERE IS LIKELY SUFFICIENT TUMOR PRESENT FOR ADDITIONAL STUDIES, IF REQUESTED. Preliminary Diagnosis MALIGNANT FAVOR NON-SMALL CELL CA ([facility]) Enid Cutter MD Pathologist, Electronic Signature (Case signed 09/13/2016)    Assessment/Plan:  Unable to clinically stage because of the uncertainty of the brain scans, but at least Stage 3A if not Stage 4 lung cancer if has brain mets, discussed with patient  and family. Family aware of appointment at cancer center next Tuesday .   [name] [name] 09/14/2016,11:18 AM Patient ID:  [name] [name], male   DOB: 20-Mar-1941, 76 y.o.   MRN: [id number]

## 2016-09-14 NOTE — Plan of Care (Signed)
Problem: RH BLADDER ELIMINATION Goal: RH STG MANAGE BLADDER WITH EQUIPMENT WITH ASSISTANCE STG Manage Bladder With Equipment With Assistance. total  Outcome: Not Progressing Not voiding after foley removed, total assist with I & O caths

## 2016-09-14 NOTE — Telephone Encounter (Signed)
Appt has been scheduled for the pt to see Dr. Julien Nordmann on 4/3 at 215pm. Pt is currently in the hospital. Mailed letter with the appt date and time.

## 2016-09-14 NOTE — Patient Care Conference (Signed)
Inpatient RehabilitationTeam Conference and Plan of Care Update Date: 09/14/2016   Time: 10:00 am    Patient Name: [name] [name]      Medical Record Number: [id number]  Date of Birth: [birth year] Sex: Male         Room/Bed: 4M01C/4M01C-01 Payor Info: Payor: MEDICARE / Plan: MEDICARE PART A AND B / Product Type: *No Product type* /    Admitting Diagnosis: FALL LUNG MASS  Admit Date/Time:  08/31/2016  5:34 PM Admission Comments: No comment available   Primary Diagnosis:  <principal problem not specified> Principal Problem: <principal problem not specified>  Patient Active Problem List   Diagnosis Date Noted  . Peripheral edema   . Slow transit constipation   . Acute lower UTI   . Confusion   . SOB (shortness of breath) 09/02/2016  . Urinary incontinence   . Abnormal urinalysis   . Leukocytosis   . HCAP (healthcare-associated pneumonia)   . Hypoalbuminemia due to protein-calorie malnutrition ([place])   . Hypokalemia   . Hyponatremia   . Urinary retention   . Sleep disturbance   . Acute bilateral deep vein thrombosis (DVT) of popliteal veins (HCC)   . Brain mass 08/31/2016  . Debility   . Benign essential HTN   . Scrotal edema   . Hyperlipidemia   . Constipation due to pain medication   . Fall from height of greater than 3 feet   . Acute blood loss anemia   . Lung mass   . Post-operative pain   . PVD (peripheral vascular disease) ([place])   . Coronary artery disease involving coronary bypass graft of native heart without angina pectoris   . PAF (paroxysmal atrial fibrillation) ([place])   . Disorder of left sacroiliac joint 08/24/2016  . Fall 08/22/2016  . Closed pelvic ring fracture ([place])   . Carotid artery stenosis, asymptomatic 08/12/2014  . Ejection murmur 07/03/2014  . Aftercare following surgery of the circulatory system, [place] 02/11/2014  . Weakness-Abdomin and Bilateral Thigh / Leg 02/11/2014  . Carotid stenosis 07/19/2013  . Occlusion and stenosis of carotid artery  without mention of cerebral infarction 07/16/2013  . Impaired fasting glucose 07/14/2013  . Knee pain 05/23/2011  . Stiffness of joint, not elsewhere classified, lower leg 05/23/2011  . Muscle weakness (generalized) 05/23/2011  . Difficulty in walking(719.7) 05/23/2011  . Bruit 11/19/2010  . AAA (abdominal aortic aneurysm) ([place]) 11/19/2010  . OTHER DYSPHAGIA 04/27/2010  . ATRIAL FIBRILLATION 12/10/2008  . JOINT EFFUSION, KNEE 01/14/2008  . KNEE PAIN 01/14/2008  . KNEE, ARTHRITIS, DEGEN./OSTEO 12/11/2007  . Hypothyroidism 10/10/2007  . Pure hypercholesterolemia 10/10/2007  . Essential hypertension 10/10/2007  . Coronary atherosclerosis 10/10/2007  . Peripheral vascular disease ([place]) 10/10/2007  . CHRONIC OBSTRUCTIVE PULMONARY DISEASE 10/10/2007  . GERD 10/10/2007  . DIVERTICULOSIS OF COLON 10/10/2007  . OSTEOPOROSIS 10/10/2007  . NEPHROLITHIASIS, HX OF 10/10/2007  . Personal history of surgery to heart and great vessels, presenting hazards to health 10/10/2007  . CHOLECYSTECTOMY, HX OF 10/10/2007  . CORONARY ARTERY BYPASS GRAFT, HX OF 10/10/2007    Expected Discharge Date: Expected Discharge Date: 09/16/16  Team Members Present: Physician leading conference: [name] Skill, PsyD;Dr. Alysia [name] Social Worker Present: [name] Kin, LCSW Nurse Present: [name] Cousin, RN PT Present: [name] [name], PT OT Present: [name] [name], OT PPS Coordinator present : [name] Nakayama, RN, CRRN     Current Status/Progress Goal Weekly Team Focus  Medical   urinary retention, no pain from pelvic fracture  void continently  d/c planning  Bowel/Bladder   Incontinent of bowel LBM 09-13-16, Foley d/c 09-13-16 patient requires in and out cath q 6  Regular bowel and bladder function   Assess bowel and bladder pattern q shift and prn    Swallow/Nutrition/ Hydration             ADL's   steady A stand pivot transfers and sit to stand to RW; min A LB self care  min A standing balance,  toileting, LB dressing; S bathing and toilet transfers (goals upgraded 09/13/16)  pt/family education to prepare for discharge, ADL and transfer training   Mobility   supervision  supervision WC level  family training, DC planning   Communication             Safety/Cognition/ Behavioral Observations            Pain   headache control with prn tylenol  pain <3  Assess pain q shift and prn   Skin   right shoulder scab, groin incision healed ota, groin MASD improved nystain in use  Skin free from new breakdown/injury with min assistance  Monitor skin q shift and prn      *See Care Plan and progress notes for long and short-term goals.  Barriers to Discharge: see above    Possible Resolutions to Barriers:  adjust bladder meds, may need foley     Discharge Planning/Teaching Needs:  Pt doing well, family being trained in preparation for home. Ready to go home this week      Team Discussion:  Reaching goals of supervision-min assist-actually goals upgraded to this due to progress in therapies. MD has increased bladder meds in hopes he can void on own. May need to go home with a foley. Pain is managed. Family education on-going with wife and children.  Revisions to Treatment Plan:  Goals upgraded to supervision-min and discharge moved up to 3/30   Continued Need for Acute Rehabilitation Level of Care: The patient requires daily medical management by a physician with specialized training in physical medicine and rehabilitation for the following conditions: Daily direction of a multidisciplinary physical rehabilitation program to ensure safe treatment while eliciting the highest outcome that is of practical value to the patient.: Yes Daily medical management of patient stability for increased activity during participation in an intensive rehabilitation regime.: Yes  [name] [name], [name] [name] 09/14/2016, 1:00 PM

## 2016-09-14 NOTE — Progress Notes (Signed)
Social Work Elease Hashimoto, LCSW Social Worker Signed   Patient Care Conference Date of Service: 09/14/2016  1:00 PM      Hide copied text Hover for attribution information Inpatient RehabilitationTeam Conference and Plan of Care Update Date: 09/14/2016   Time: 10:00 am      Patient Name: [name] [name]      Medical Record Number: [id number]  Date of Birth: [birth year] Sex: Male         Room/Bed: 4M01C/4M01C-01 Payor Info: Payor: MEDICARE / Plan: MEDICARE PART A AND B / Product Type: *No Product type* /     Admitting Diagnosis: FALL LUNG MASS  Admit Date/Time:  08/31/2016  5:34 PM Admission Comments: No comment available    Primary Diagnosis:  <principal problem not specified> Principal Problem: <principal problem not specified>       Patient Active Problem List    Diagnosis Date Noted  . Peripheral edema    . Slow transit constipation    . Acute lower UTI    . Confusion    . SOB (shortness of breath) 09/02/2016  . Urinary incontinence    . Abnormal urinalysis    . Leukocytosis    . HCAP (healthcare-associated pneumonia)    . Hypoalbuminemia due to protein-calorie malnutrition ([place])    . Hypokalemia    . Hyponatremia    . Urinary retention    . Sleep disturbance    . Acute bilateral deep vein thrombosis (DVT) of popliteal veins (HCC)    . Brain mass 08/31/2016  . Debility    . Benign essential HTN    . Scrotal edema    . Hyperlipidemia    . Constipation due to pain medication    . Fall from height of greater than 3 feet    . Acute blood loss anemia    . Lung mass    . Post-operative pain    . PVD (peripheral vascular disease) ([place])    . Coronary artery disease involving coronary bypass graft of native heart without angina pectoris    . PAF (paroxysmal atrial fibrillation) ([place])    . Disorder of left sacroiliac joint 08/24/2016  . Fall 08/22/2016  . Closed pelvic ring fracture ([place])    . Carotid artery stenosis, asymptomatic 08/12/2014  . Ejection murmur  07/03/2014  . Aftercare following surgery of the circulatory system, [place] 02/11/2014  . Weakness-Abdomin and Bilateral Thigh / Leg 02/11/2014  . Carotid stenosis 07/19/2013  . Occlusion and stenosis of carotid artery without mention of cerebral infarction 07/16/2013  . Impaired fasting glucose 07/14/2013  . Knee pain 05/23/2011  . Stiffness of joint, not elsewhere classified, lower leg 05/23/2011  . Muscle weakness (generalized) 05/23/2011  . Difficulty in walking(719.7) 05/23/2011  . Bruit 11/19/2010  . AAA (abdominal aortic aneurysm) ([place]) 11/19/2010  . OTHER DYSPHAGIA 04/27/2010  . ATRIAL FIBRILLATION 12/10/2008  . JOINT EFFUSION, KNEE 01/14/2008  . KNEE PAIN 01/14/2008  . KNEE, ARTHRITIS, DEGEN./OSTEO 12/11/2007  . Hypothyroidism 10/10/2007  . Pure hypercholesterolemia 10/10/2007  . Essential hypertension 10/10/2007  . Coronary atherosclerosis 10/10/2007  . Peripheral vascular disease ([place]) 10/10/2007  . CHRONIC OBSTRUCTIVE PULMONARY DISEASE 10/10/2007  . GERD 10/10/2007  . DIVERTICULOSIS OF COLON 10/10/2007  . OSTEOPOROSIS 10/10/2007  . NEPHROLITHIASIS, HX OF 10/10/2007  . Personal history of surgery to heart and great vessels, presenting hazards to health 10/10/2007  . CHOLECYSTECTOMY, HX OF 10/10/2007  . CORONARY ARTERY BYPASS GRAFT, HX OF 10/10/2007      Expected Discharge Date:  Expected Discharge Date: 09/16/16   Team Members Present: Physician leading conference: Ilean Skill, PsyD;Dr. Alysia Penna Social Worker Present: Ovidio Kin, LCSW Nurse Present: Elliot Cousin, RN PT Present: Carney Living, PT OT Present: Willeen Cass, OT PPS Coordinator present : Daiva Nakayama, RN, CRRN       Current Status/Progress Goal Weekly Team Focus  Medical     urinary retention, no pain from pelvic fracture  void continently  d/c planning   Bowel/Bladder     Incontinent of bowel LBM 09-13-16, Foley d/c 09-13-16 patient requires in and out cath q 6  Regular bowel and  bladder function   Assess bowel and bladder pattern q shift and prn    Swallow/Nutrition/ Hydration               ADL's     steady A stand pivot transfers and sit to stand to RW; min A LB self care  min A standing balance, toileting, LB dressing; S bathing and toilet transfers (goals upgraded 09/13/16)  pt/family education to prepare for discharge, ADL and transfer training   Mobility     supervision  supervision WC level  family training, DC planning   Communication               Safety/Cognition/ Behavioral Observations             Pain     headache control with prn tylenol  pain <3  Assess pain q shift and prn   Skin     right shoulder scab, groin incision healed ota, groin MASD improved nystain in use  Skin free from new breakdown/injury with min assistance  Monitor skin q shift and prn     *See Care Plan and progress notes for long and short-term goals.   Barriers to Discharge: see above     Possible Resolutions to Barriers:  adjust bladder meds, may need foley      Discharge Planning/Teaching Needs:  Pt doing well, family being trained in preparation for home. Ready to go home this week      Team Discussion:  Reaching goals of supervision-min assist-actually goals upgraded to this due to progress in therapies. MD has increased bladder meds in hopes he can void on own. May need to go home with a foley. Pain is managed. Family education on-going with wife and children.  Revisions to Treatment Plan:  Goals upgraded to supervision-min and discharge moved up to 3/30    Continued Need for Acute Rehabilitation Level of Care: The patient requires daily medical management by a physician with specialized training in physical medicine and rehabilitation for the following conditions: Daily direction of a multidisciplinary physical rehabilitation program to ensure safe treatment while eliciting the highest outcome that is of practical value to the patient.: Yes Daily medical  management of patient stability for increased activity during participation in an intensive rehabilitation regime.: Yes   Brookley Spitler, [name] 09/14/2016, 1:00 PM      Elease Hashimoto, [place] Social Worker Signed   Patient Care Conference Date of Service: 09/07/2016  2:54 PM      Hide copied text Hover for attribution information Inpatient RehabilitationTeam Conference and Plan of Care Update Date: 09/07/2016   Time: 2:30 PM      Patient Name: [name] [name]      Medical Record Number: [id number]  Date of Birth: Apr 19, 1941 Sex: Male         Room/Bed: 4M01C/4M01C-01 Payor Info: Payor: MEDICARE / Plan: MEDICARE PART A  AND B / Product Type: *No Product type* /     Admitting Diagnosis: FALL  Admit Date/Time:  08/31/2016  5:34 PM Admission Comments: No comment available    Primary Diagnosis:  <principal problem not specified> Principal Problem: <principal problem not specified>       Patient Active Problem List    Diagnosis Date Noted  . Slow transit constipation    . Acute lower UTI    . Confusion    . SOB (shortness of breath) 09/02/2016  . Urinary incontinence    . Abnormal urinalysis    . Leukocytosis    . HCAP (healthcare-associated pneumonia)    . Hypoalbuminemia due to protein-calorie malnutrition ([place])    . Hypokalemia    . Hyponatremia    . Urinary retention    . Sleep disturbance    . Acute bilateral deep vein thrombosis (DVT) of popliteal veins (H[facility])    . Brain mass 08/31/2016  . Debility    . Benign essential HTN    . Scrotal edema    . Hyperlipidemia    . Constipation due to pain medication    . Fall from height of greater than 3 feet    . Acute blood loss anemia    . Lung mass    . Post-operative pain    . PVD (peripheral vascular disease) ([place])    . Coronary artery disease involving coronary bypass graft of native heart without angina pectoris    . PAF (paroxysmal atrial fibrillation) ([place])    . Disorder of left sacroiliac joint 08/24/2016  . Fall  08/22/2016  . Closed pelvic ring fracture ([place])    . Carotid artery stenosis, asymptomatic 08/12/2014  . Ejection murmur 07/03/2014  . Aftercare following surgery of the circulatory system, [place] 02/11/2014  . Weakness-Abdomin and Bilateral Thigh / Leg 02/11/2014  . Carotid stenosis 07/19/2013  . Occlusion and stenosis of carotid artery without mention of cerebral infarction 07/16/2013  . Impaired fasting glucose 07/14/2013  . Knee pain 05/23/2011  . Stiffness of joint, not elsewhere classified, lower leg 05/23/2011  . Muscle weakness (generalized) 05/23/2011  . Difficulty in walking(719.7) 05/23/2011  . Bruit 11/19/2010  . AAA (abdominal aortic aneurysm) ([place]) 11/19/2010  . OTHER DYSPHAGIA 04/27/2010  . ATRIAL FIBRILLATION 12/10/2008  . JOINT EFFUSION, KNEE 01/14/2008  . KNEE PAIN 01/14/2008  . KNEE, ARTHRITIS, DEGEN./OSTEO 12/11/2007  . Hypothyroidism 10/10/2007  . Pure hypercholesterolemia 10/10/2007  . Essential hypertension 10/10/2007  . Coronary atherosclerosis 10/10/2007  . Peripheral vascular disease ([place]) 10/10/2007  . CHRONIC OBSTRUCTIVE PULMONARY DISEASE 10/10/2007  . GERD 10/10/2007  . DIVERTICULOSIS OF COLON 10/10/2007  . OSTEOPOROSIS 10/10/2007  . NEPHROLITHIASIS, HX OF 10/10/2007  . Personal history of surgery to heart and great vessels, presenting hazards to health 10/10/2007  . CHOLECYSTECTOMY, HX OF 10/10/2007  . CORONARY ARTERY BYPASS GRAFT, HX OF 10/10/2007      Expected Discharge Date: Expected Discharge Date: 09/17/16   Team Members Present: Physician leading conference: Dr. Delice Lesch Social Worker Present: Ovidio Kin, LCSW Nurse Present: Heather Roberts, RN PT Present: Carney Living, PT OT Present: Cherylynn Ridges, OT PPS Coordinator present : Daiva Nakayama, RN, CRRN       Current Status/Progress Goal Weekly Team Focus  Medical     Decreased functional mobility secondary to Closed pelvic ring fracture with disorder of left sacroiliac joint with  retroperitoneal hemorrhage in the left side of the pelvis with subcutaneous hemorrhage in the left buttocks after fall status post ORIF with  sacroiliac screw fixation of left SI joint 08/25/2016.   Improve mobility, transfers, edema, hyponatremia, infection  See above   Bowel/Bladder     incontinent of bowel,has miralax and senna scheduled, LBM 3/20(had several incontinent stools after being disimpacted). Foley cath reinserted 3/20 after failed voiding trial,urine is hazy, pt on rocephin and vanco IV for UTI.  normal bowel and bladder function with minimal assist  maintain bowel movement every 1-2 days, foley care q shift and prn.   Swallow/Nutrition/ Hydration               ADL's     Min A transfers, Min/mod A LB ADL  Min A overall  Transfers, pt/family education, LB dressing, cognitive training,    Mobility     min A  supervision WC level  functional transfers, bed mobility, maintaining precautions, generalized strengthening, cognition, pt/family education   Communication               Safety/Cognition/ Behavioral Observations             Pain     pt has occasional right shoulder or right leg pain, well controlled on prn roxicodone, however, the past few days has had c/o severe burning pain to groin/penis,Pain appears to be MASD, questionable yeast infection?   pain </= 2  assess pain q shift and prn,    Skin     groin MASD worsened with numerous incontinent stools on 3/20. severe pain to groin/testicles, extreme swelling to foreskin of penis and scrotum, wife states swelling is "better".  pt has severe echymosis from fall that preceded hospitalization, incision to lower pelvic area CDI with sutures, OTA.  L hip ORIF incision, CDI with foam dressing.   skin without breakdown upon d/c from rehab  monitor skin q shift and prn.     *See Care Plan and progress notes for long and short-term goals.   Barriers to Discharge: Transfers, ABLA, Hyponatremia, scrotal edema, hyponatremia, HCAP, UTI,  lung mass, urinary retention     Possible Resolutions to Barriers:  Bx ?this week, follow labs, diuretics, IV abx     Discharge Planning/Teaching Needs:  Wife is here daily and assisting with his care here. Wife and children to assist with his care. Neuro-psych to see today for coping      Team Discussion:  Goals supervision wheelchair level-min assist with B & D due to WB issues. ON IV antibiotics for pneumonia & UTI. Starting gabapentin for bladder spasms. Needs much cueing due to difficulty with sequencing. Pushing po. Adjusting BP meds currently too low. Foley placed back due to failed voiding trial. Bronchscopy end of the week.Switch flomax to night time. Family here daily to provide emotional support  Revisions to Treatment Plan:  DC 3/31    Continued Need for Acute Rehabilitation Level of Care: The patient requires daily medical management by a physician with specialized training in physical medicine and rehabilitation for the following conditions: Daily direction of a multidisciplinary physical rehabilitation program to ensure safe treatment while eliciting the highest outcome that is of practical value to the patient.: Yes Daily medical management of patient stability for increased activity during participation in an intensive rehabilitation regime.: Yes Daily analysis of laboratory values and/or radiology reports with any subsequent need for medication adjustment of medical intervention for : Post surgical problems;Neurological problems;Urological problems;Other;Pulmonary problems   Elease Hashimoto 09/07/2016, 2:54 PM       Patient ID: [name] [name], male   DOB: 09-05-1940, 76 y.o.  MRN: [id number]

## 2016-09-15 ENCOUNTER — Inpatient Hospital Stay (HOSPITAL_COMMUNITY): Payer: Medicare Other | Admitting: Physical Therapy

## 2016-09-15 ENCOUNTER — Inpatient Hospital Stay (HOSPITAL_COMMUNITY): Payer: Medicare Other

## 2016-09-15 DIAGNOSIS — R310 Gross hematuria: Secondary | ICD-10-CM

## 2016-09-15 LAB — URINALYSIS, ROUTINE W REFLEX MICROSCOPIC
Bilirubin Urine: NEGATIVE
GLUCOSE, UA: NEGATIVE mg/dL
KETONES UR: NEGATIVE mg/dL
Nitrite: NEGATIVE
PROTEIN: 100 mg/dL — AB
SQUAMOUS EPITHELIAL / LPF: NONE SEEN
Specific Gravity, Urine: 1.015 (ref 1.005–1.030)
pH: 6 (ref 5.0–8.0)

## 2016-09-15 LAB — PROTIME-INR
INR: 1.9
Prothrombin Time: 22.1 seconds — ABNORMAL HIGH (ref 11.4–15.2)

## 2016-09-15 MED ORDER — WARFARIN SODIUM 7.5 MG PO TABS
7.5000 mg | ORAL_TABLET | Freq: Once | ORAL | Status: AC
  Administered 2016-09-15: 7.5 mg via ORAL
  Filled 2016-09-15: qty 1

## 2016-09-15 NOTE — Progress Notes (Signed)
ANTICOAGULATION CONSULT NOTE - Follow Up Consult  Pharmacy Consult for coumadin and lovenox Indication: DVT  Allergies  Allergen Reactions  . Imitrex [Sumatriptan] Nausea And Vomiting and Other (See Comments)    "made me like I was having a stoke"  . Lipitor [Atorvastatin] Nausea And Vomiting and Other (See Comments)    INTOLERANCE > MYALGIAS "couldn't get out of the bed ( pt had to crawl out of bed ) I hurt so bad"    Patient Measurements: Height: '5\' 6"'$  (167.6 cm) Weight: 185 lb 6.4 oz (84.1 kg) IBW/kg (Calculated) : 63.8 Heparin Dosing Weight:   Vital Signs: Temp: 98.5 F (36.9 C) (03/29 0544) Temp Source: Oral (03/29 0544) BP: 125/56 (03/29 0544) Pulse Rate: 78 (03/29 0544)  Labs:  Recent Labs  09/12/16 1037 09/13/16 0317 09/14/16 0545 09/15/16 0457  HGB 12.5*  --  12.1*  --   HCT 38.4*  --  36.2*  --   PLT 345  --  270  --   LABPROT 16.4* 16.8* 18.9* 22.1*  INR 1.31 1.35 1.57 1.90  CREATININE 1.06  --   --   --     Estimated Creatinine Clearance: 58.4 mL/min (by C-G formula based on SCr of 1.06 mg/dL).   Medications:  Scheduled:  . bethanechol  25 mg Oral TID  . bumetanide  1 mg Oral Daily  . enoxaparin (LOVENOX) injection  85 mg Subcutaneous Q12H  . feeding supplement (ENSURE ENLIVE)  237 mL Oral Q1500  . gabapentin  100 mg Oral QHS  . levothyroxine  112 mcg Oral QAC breakfast  . linaclotide  145 mcg Oral QAC breakfast  . metoprolol  12.5 mg Oral BID  . nystatin   Topical BID  . pantoprazole  40 mg Oral Daily  . polyethylene glycol  17 g Oral BID  . potassium chloride  20 mEq Oral Daily  . rosuvastatin  20 mg Oral q1800  . senna-docusate  2 tablet Oral BID  . sodium chloride  1 spray Each Nare QID  . tamsulosin  0.8 mg Oral Daily  . triamterene-hydrochlorothiazide  1 tablet Oral Daily  . Warfarin - Pharmacist Dosing Inpatient   Does not apply q1800   Infusions:  . lactated ringers 10 mL/hr at 09/09/16 1610    Assessment: 76 yo male with DVT  is currently on subtherapeutic coumadin bridging with treatment dose of lovenox.  INR today is 1.9, CBC stable. Crcl ~ 60 ml/min  Goal of Therapy:  Anti-Xa level 0.6-1 units/ml 4hrs after LMWH dose given; INR 2-3 Monitor platelets by anticoagulation protocol: Yes   Plan:  -Continue Lovenox 85 mg SCq12h -Coumadin 7.5 mg po x1 (may need 7.5 mg most days and 10 mg 2-3 days a week) -Daily PT/INR  Maryanna Shape, PharmD, BCPS  Clinical Pharmacist  Pager: [phone]  09/15/2016,10:11 AM

## 2016-09-15 NOTE — Progress Notes (Signed)
Occupational Therapy Session Note  Patient Details  Name: [name] [name] MRN: [id number] Date of Birth: 09-02-1940  Today's Date: 09/15/2016 OT Individual Time: 0700-0825 OT Individual Time Calculation (min): 85 min    Short Term Goals: Week 1:  OT Short Term Goal 1 (Week 1): STGs = LTGs  Skilled Therapeutic Interventions/Progress Updates:    Pt resting in bed upon arrival eating breakfast.  Pt engaged in ongoing BADL retraining including bathing at shower level and dressing with sit<>stand from w/c.  Focus on safety awareness, sit<>stand, standing balance, functional transfers, and activity tolerance.  Continued discharge planning and home safety education.  Pt requires steady A when standing for toileting tasks and LB dressing tasks.  Family and pt are pleased with progress and ready for discharge tomorrow.   Therapy Documentation Precautions:  Precautions Precautions: Fall Restrictions Weight Bearing Restrictions: Yes RLE Weight Bearing: Weight bearing as tolerated LLE Weight Bearing: Non weight bearing Other Position/Activity Restrictions: WBAT RLE for transfers only Pain: Pain Assessment Pain Assessment: No/denies pain ADL: ADL ADL Comments: refer to functional navigator  See Function Navigator for Current Functional Status.   Therapy/Group: Individual Therapy  Leroy Libman 09/15/2016, 8:25 AM

## 2016-09-15 NOTE — Discharge Summary (Signed)
Discharge summary job [id number]

## 2016-09-15 NOTE — Progress Notes (Signed)
[facility]ILIT[facility]     PROGRESS NOTE  Subjective/Complaints:  Some hematuria, no abd pain  ROS: pt denies nausea, vomiting, diarrhea, cough, shortness of breath or chest pain   Objective: Vital Signs: Blood pressure (!) 125/56, pulse 78, temperature 98.5 F (36.9 C), temperature source Oral, resp. rate 18, height '5\' 6"'$  (1.676 m), weight 84.1 kg (185 lb 6.4 oz), SpO2 98 %. No results found.  Recent Labs  09/12/16 1037 09/14/16 0545  WBC 6.3 5.2  HGB 12.5* 12.1*  HCT 38.4* 36.2*  PLT 345 270    Recent Labs  09/12/16 1037  NA 132*  K 4.0  CL 94*  GLUCOSE 137*  BUN 29*  CREATININE 1.06  CALCIUM 9.5   CBG (last 3)  No results for input(s): GLUCAP in the last 72 hours.  Wt Readings from Last 3 Encounters:  08/31/16 84.1 kg (185 lb 6.4 oz)  08/22/16 71.7 kg (158 lb)  08/18/16 71.8 kg (158 lb 6 oz)    Physical Exam:  BP (!) 125/56 (BP Location: Left Arm)   Pulse 78   Temp 98.5 F (36.9 C) (Oral)   Resp 18   Ht '5\' 6"'$  (1.676 m)   Wt 84.1 kg (185 lb 6.4 oz)   SpO2 98%   BMI 29.92 kg/m  Constitutional: He appears well-developed and well-nourished.  HENT: Normocephalic and atraumatic.  Eyes: EOMI. No discharge.  Cardiovascular: RRR Respiratory: normal effort GI: Soft. Bowel sounds are normal.  Musculoskeletal: He exhibits edema and tenderness.   Neurological: He is alert.  HOH Alert, oriented Motor: RUE 4+/5 proximal to distal LUE: 4/5 proximal to distal RLE: 3-/5 proximal to distal LLE: 3/5 HF, 3+/5 KE, 4-/5 ADF/PF (sl incr) Skin: Skin is warm and dry.  Psychiatric: His mood appears pleasant and appropriate   Assessment/Plan: 1. Functional deficits secondary to closed pelvic ring fracture with retroperitoneal hemorrhage in the left pelvis s/p ORIF of SI after fall which require 3+ hours per day of interdisciplinary therapy in a comprehensive inpatient rehab setting. Physiatrist is providing close team supervision and 24 hour  management of active medical problems listed below. Physiatrist and rehab team continue to assess barriers to discharge/monitor patient progress toward functional and medical goals.  Function:  Bathing Bathing position   Position: Shower  Bathing parts Body parts bathed by patient: Right arm, Left arm, Chest, Abdomen, Front perineal area, Right upper leg, Left upper leg, Buttocks, Right lower leg, Left lower leg, Back Body parts bathed by helper: Back  Bathing assist Assist Level: Supervision or verbal cues (seated with lateral leans)      Upper Body Dressing/Undressing Upper body dressing   What is the patient wearing?: Pull over shirt/dress     Pull over shirt/dress - Perfomed by patient: Thread/unthread right sleeve, Put head through opening, Thread/unthread left sleeve, Pull shirt over trunk          Upper body assist Assist Level: Set up   Set up : To obtain clothing/put away  Lower Body Dressing/Undressing Lower body dressing   What is the patient wearing?: Underwear, Pants, Socks, Shoes   Underwear - Performed by helper: Thread/unthread right underwear leg, Thread/unthread left underwear leg, Pull underwear up/down Pants- Performed by patient: Thread/unthread right pants leg, Thread/unthread left pants leg, Pull pants up/down Pants- Performed by helper: Pull pants up/down   Non-skid slipper socks- Performed by helper: Don/doff right sock, Don/doff left sock Socks - Performed by patient: Don/doff right sock, Don/doff left sock  Shoes - Performed by patient: Don/doff right shoe Shoes - Performed by helper: Fasten right          Lower body assist Assist for lower body dressing: Touching or steadying assistance (Pt > 75%)      Toileting Toileting Toileting activity did not occur: No continent bowel/bladder event   Toileting steps completed by helper: Adjust clothing prior to toileting, Performs perineal hygiene, Adjust clothing after toileting    Toileting  assist Assist level: Two helpers   Transfers Chair/bed transfer   Chair/bed transfer method: Stand pivot Chair/bed transfer assist level: Supervision or verbal cues Chair/bed transfer assistive device: Armrests, Walker     Locomotion Ambulation Ambulation activity did not occur: Safety/medical concerns (orders for transfers only)         Wheelchair   Type: Manual Max wheelchair distance: > 500 ft Assist Level: Supervision or verbal cues  Cognition Comprehension Comprehension assist level: Follows complex conversation/direction with extra time/assistive device  Expression Expression assist level: Expresses complex 90% of the time/cues < 10% of the time  Social Interaction Social Interaction assist level: Interacts appropriately with others with medication or extra time (anti-anxiety, antidepressant).  Problem Solving Problem solving assist level: Solves basic 75 - 89% of the time/requires cueing 10 - 24% of the time  Memory Memory assist level: Recognizes or recalls 75 - 89% of the time/requires cueing 10 - 24% of the time    Medical Problem List and Plan: 1.  Decreased functional mobility secondary to Closed pelvic ring fracture with disorder of left sacroiliac joint with retroperitoneal hemorrhage in the left side of the pelvis with subcutaneous hemorrhage in the left buttocks after fall status post ORIF with sacroiliac screw fixation of left SI joint 08/25/2016. Weightbearing as tolerated right lower extremity for transfers only nonweightbearing left lower extremity  Cont CIR plan d/c in am2.  Acute DVT:   Subcutaneous Lovenox resumed  Vascular study with b/l peroneal and tibial vein dvt's  Monitor for any bleeding episodes 3. Pain Management: only using tylenol 4. Mood: Provide emotional support 5. Neuropsych: This patient is not capable of making decisions on his own behalf. 6. Skin/Wound Care: Routine skin checks  Nystatin ordered for perineal area, improved 7.  Fluids/Electrolytes/Nutrition: Routine I&Os 8. Acute blood loss anemia. Improving  Hb 12.5 on 3/27    Cont to monitor 9. Hypertension.   Lopressor decreased to 50 mg twice a day on 3/19, decreased to 37.5 BID on 3/20, decreased to 12.5 on 3/21  Maxzide 37.5.-25 milligrams daily  Controlled 3/24 10. CAD status post CABG. No chest pain or shortness of breath. Plavix resumed.   On ASA, Coumadin per pharm 11. Atrial fibrillation. Cardiac rate controlled. Continue Lopressor. 12. PVD with multiple revascularization procedures. Continue Plavix. 13. Right middle lobe lung/brain mass.    CTS consulted, bronch/bx completed yesterday, f/u cancer ctr    14. Scrotal edema with urinary retention  Failed voiding trial, Foley replaced  Bumex started 3/22  Continue Flomax 15. Hypothyroidism. Synthroid 16. Hyperlipidemia. Crestor 17. Constipation. Laxative assistance 18. Hyponatremia  Na+ 132 on 3/26   Cont to monitor 19. Hypokalemia- resolved  K+ 4.0 on 3/26  Supplementing   20. Hypoalbuminemia  Supplement initiated 3/15 21. Sleep disturbance  Trazodone PRN started 3/15 22. Leukocytosis: Resolved 23. Confusion: nearly resolved  Multifactorial ?UTI +/- meds, +/- sleep +/- TBI 24. Urinary retention developing hematuria, insert forley check UA, D/C flomax and urecholine   LOS (Days) 15 A FACE TO FACE EVALUATION WAS PERFORMED  Charlett Blake 09/15/2016 6:55 AM

## 2016-09-15 NOTE — Progress Notes (Signed)
Social Work  Discharge Note  The overall goal for the admission was met for:   Discharge location: Yes-HOME WITH WIFE WHO CAN PROVIDE 24 HR CARE  Length of Stay: Yes-16 DAYS  Discharge activity level: Yes-SUPERVISION-MIN ASSIST  Home/community participation: Yes  Services provided included: MD, RD, PT, OT, SLP, RN, CM, TR, Pharmacy, Neuropsych and SW  Financial Services: Medicare and Private Insurance: [place]  Follow-up services arranged: Home Health: [facility]-PT,OT,RN, DME: [facility] [place] and Patient/Family request agency HH: [facility], DME: [facility]  Comments (or additional information):WIFE STAYED [place] TO [place]. WHILE HERE FOUND OUT PROBABLE LUNG CA FOLLOW UP WITH ONCOLOGIST NEXT WEEK  Patient/Family verbalized understanding of follow-up arrangements: Yes  Individual responsible for coordination of the follow-up plan: WIFE  Confirmed correct DME delivered: Elease Hashimoto 09/15/2016    Elease Hashimoto

## 2016-09-15 NOTE — Discharge Summary (Signed)
[name] [name], SAINTJEAN NO.:  [id number]  MEDICAL RECORD NO.:  [id number]  LOCATION:  4M01C                        FACILITY:  [facility]  PHYSICIAN:  Delice Lesch, MD        DATE OF BIRTH:  08-03-1940  DATE OF ADMISSION:  08/31/2016 DATE OF DISCHARGE:  09/16/2016                              DISCHARGE SUMMARY   DISCHARGE DIAGNOSES: 1. Closed pelvic ring fracture with disorder of left sacroiliac joint     with retroperitoneal hemorrhage, left side of pelvis after a fall,     status post open reduction and internal fixation of sacroiliac with     screw fixation. 2. Bilateral posterior tibial and peroneal vein deep venous     thromboses. 3. Pain management. 4. Right middle lobe lung mass, suspect non-small cell adenocarcinoma. 5. Acute blood loss anemia. 6. Hypertension. 7. Coronary artery disease with coronary artery bypass grafting. 8. Atrial fibrillation. 9. Peripheral vascular disease with multiple revascularization     procedures. 10.Scrotal edema with urinary retention/UTI. 11.Hypothyroidism. 12.Hyperlipidemia. 13.Constipation. 14.Hypokalemia, resolved. 15.Health care-associated pneumonia, resolved.   HISTORY OF PRESENT ILLNESS:  This is a 76 year old male with history of peripheral vascular disease, left carotid surgery, CAD with CABG, atrial fibrillation, maintained on aspirin and Plavix, independent prior to admission, living with his wife.  Presented on August 22, 2016 after a fall from a ladder while cleaning a stove pipe, denied loss of consciousness.  Cranial CT reviewed, unremarkable for acute process, but did show abnormal anterior left frontal lobe appearance, did not resemble typical traumatic injury.  CT cervical spine negative.  CT abdomen and pelvis showed disruption of the symphysis pubic and left sacroiliac joint with retroperitoneal hemorrhage in the left side of the pelvis and subcutaneous heparin in the left buttocks as well as fractures  left transverse process of L2-S1.  Also incidental findings of spiculated mass in the right middle lobe consistent for carcinoma of the lung with metastatic adenopathy in the subcarinal region.  Several small pulmonary nodules were too small to characterize.  Underwent open reduction, internal fixation of pubic symphysis with sacroiliac screw fixation, August 25, 2016, per Dr. Marcelino Scot.  Nonweightbearing, left lower extremity.  Weightbearing as tolerated right lower extremity for transfers only.  Hospital course, pain management.  Lung mass to be evaluated ongoing during hospital stay.  Acute blood loss anemia, he was transfused.  Foley catheter tube in place for scrotal edema, plan voiding trial.  Subcutaneous Lovenox for DVT prophylaxis.  The patient was admitted for a comprehensive rehab program.  PAST MEDICAL HISTORY:  See discharge diagnoses.  SOCIAL HISTORY:  Independent prior to admission, living with his wife. Functional status upon admission to rehab services was max assist, sit to stand; max assist, stand pivot transfers; mod max assist, activities of daily living.  PHYSICAL EXAMINATION:  VITAL SIGNS:  Blood pressure 140/57, pulse 78, temperature 98, respirations 18. GENERAL:  This was an alert male, well developed, well nourished. HEENT:  EOMs intact. NECK:  Supple.  Nontender.  No JVD. CARDIAC:  Regular rate and rhythm.  No murmur. ABDOMEN:  Soft, nontender.  Good bowel sounds. LUNGS:  Decreased breath sounds at the bases, but clear to  auscultation. SKIN:  Surgical site clean, dry, and intact.  REHABILITATION HOSPITAL COURSE:  The patient was admitted to inpatient rehab services with therapies initiated on a 3-hour daily basis, consisting of physical therapy, occupational therapy, and rehabilitation nursing.  The following issues were addressed during the patient's rehabilitation stay.  Pertaining to Mr. Pautz pelvic ring fracture with disorder of left sacroiliac joint,  he had undergone ORIF, screw fixation on August 25, 2016, per Dr. Marcelino Scot.  Weightbearing as tolerated, right lower extremity and nonweightbearing, left lower extremity. Neurovascular sensation intact.  Routine vascular studies completed on September 01, 2016, showing bilateral posterior tibial and peroneal vein DVTs.  The patient had been on subcutaneous Lovenox for DVT prophylaxis as well as aspirin and Plavix for history of atrial fibrillation and CAD.  Consultation obtained with Cardiology to begin Coumadin therapy that was initiated and close monitoring for any bleeding episodes.  His Plavix and aspirin were discontinued once Coumadin initiated.  Acute blood loss anemia remained stable, latest hemoglobin 12.5.  Blood pressures controlled and monitored with current regimen.  The patient had no chest pain or increasing shortness of breath.  Cardiac rate controlled.  Scrotal edema with urinary retention.  Attempts at voiding trial, maintained on Flomax and Urecholine. UTI Enterobacter patient had already been placed on antibiotic broad-spectrum coverage for hospital-acquired pneumonia.  The patient was in demand of intermittent catheterization and this could not be performed by family.  On his discharge, a Foley catheter tube would be placed.  He remained on hormone supplement for hypothyroidism.  During his hospital course, he was treated with vancomycin and Rocephin for hospital-acquired pneumonia, completing antibiotic course, afebrile at discharge.  Workup by Cardiothoracic Surgery as well as  Oncology for incidental findings of right middle lobe lung mass, bronchoscopy completed that showed preliminary reports of non-small cell adenocarcinoma.  Arrangements were made to follow up outpatient with Oncology Services, Dr. Curt Bears for ongoing evaluation and recommendations.  All issues were discussed with the patient and family. The patient received weekly collaborative interdisciplinary  team conferences to discuss estimated length of stay, family teaching, any barriers to his discharge.  Sessions focused on transfers supine to sit, modified independent; stand pivot transfers, rolling walker, wheelchair, and armrest, equipment use; outdoor bench transfers with supervision assist, he could propel his wheelchair, demonstrated wheelchair parts and management.  He needed some assistance for lower body activities of daily living and hygiene, required steady assist when standing to pull up his pants.  Overall, therapies were limited due to weightbearing status.  Full family teaching was completed and planned discharge to home.  DISCHARGE MEDICATIONS:  At time of dictation included; 1. Bumex 1 mg p.o. daily. 2. Neurontin 100 mg p.o. at bedtime. 3. Synthroid 112 mcg p.o. daily. 4. Linzess 145 mcg p.o. daily. 5. Lopressor 12.5 mg p.o. b.i.d. 6. Protonix 40 mg p.o. daily. 7. MiraLAX daily. 8. Potassium chloride 20 mEq daily. 9. Crestor 20 mg daily. 10.Senokot-S 2 tablets b.i.d. 11.Maxzide 37.5/25 mg p.o. daily. 12.Xanax 0.25 mg p.o. t.i.d. as needed. 13.Tylenol as needed for pain. 14.The patient is on Urecholine 25 mg p.o. t.i.d. with ongoing     attempts at voiding trial.  If family unable to complete INO     catheterizations, if needed, a Foley catheter tube would be placed     and Urecholine discontinued.  FOLLOWUP:  The patient would follow up with Dr. Delice Lesch at the outpatient rehab service office as directed; Dr. Curt Bears on September 20, 2016;  Dr. Lanelle Bal, call for appointment; Dr. Altamese  in 2 weeks, call for appointment; Dr. Rosemary Holms, medical management.  DIET:  The patient on a regular diet.  ACTIVITIES:  Nonweightbearing, left lower extremity.  He would continue on Coumadin for DVT prophylaxis with a home health nurse arranged to check INR on September 21, 2016, results to [facility], [phone], fax [phone].     Lauraine Rinne, P.A.   ______________________________ Delice Lesch, MD    DA/MEDQ  D:  09/15/2016  T:  09/15/2016  Job:  [id number]  cc:   Astrid Divine. Marcelino Scot, M.D. Margaretmary Eddy, M.D. Lanelle Bal, MD Delice Lesch, MD Eilleen Kempf, M.D.

## 2016-09-15 NOTE — Progress Notes (Signed)
At 2000-no void, PVR=409, I & O cath=450, few blood clots observed. At 0250-no void, PVR=453, I & O cath=450, blood clots noted. PRN tylenol given at HS for C/O HA. Patrici Ranks A

## 2016-09-15 NOTE — Progress Notes (Signed)
Occupational Therapy Discharge Summary  Patient Details  Name: [name] [name] MRN: [id number] Date of Birth: [birth year]   Patient has met 7 of 7 long term goals due to improved activity tolerance, improved balance, ability to compensate for deficits and improved awareness.  Pt made steady progress with BADLs during this admission.  Pt requires min A for standing balance to pull up pants and for toileting tasks.  Pt performs functional transfers at supervision level.  Pt's SO has been present for therapy.  Pt and SO pleased with progress and ready for discharge tomorrow. Patient to discharge at [facility] Assist level.  Patient's care partner is independent to provide the necessary physical and cognitive assistance at discharge.     Recommendation:  Patient will benefit from ongoing skilled OT services in home health setting to continue to advance functional skills in the area of BADL.  Equipment: Pt already owns necessary equipment  Reasons for discharge: treatment goals met  Patient/family agrees with progress made and goals achieved: Yes  OT Discharge ADL ADL ADL Comments: refer to functional navigator Vision/Perception  Vision- History Baseline Vision/History: Wears glasses Wears Glasses: Reading only Patient Visual Report: No change from baseline Vision- Assessment Vision Assessment?: No apparent visual deficits  Cognition Overall Cognitive Status: Within Functional Limits for tasks assessed Orientation Level: Oriented X4 Attention: Sustained;Selective Sustained Attention: Appears intact Selective Attention: Impaired Memory: Appears intact Awareness: Appears intact Problem Solving: Appears intact Comments: Pt's family report that he is back at baseline with some "mild forgetfulness" at baseline, wife to provide support when pt is discharged Sensation Sensation Light Touch: Appears Intact Stereognosis: Appears Intact Hot/Cold: Appears Intact Proprioception: Appears  Intact Coordination Gross Motor Movements are Fluid and Coordinated: Yes Fine Motor Movements are Fluid and Coordinated: Yes Motor  Motor Motor: Within Functional Limits Motor - Skilled Clinical Observations: generalized motor weakness     Trunk/Postural Assessment  Cervical Assessment Cervical Assessment: Within Functional Limits Thoracic Assessment Thoracic Assessment: Within Functional Limits Lumbar Assessment Lumbar Assessment: Within Functional Limits Postural Control Postural Control: Within Functional Limits  Balance Dynamic Sitting Balance Dynamic Sitting - Level of Assistance: 6: Modified independent (Device/Increase time) Extremity/Trunk Assessment RUE Assessment RUE Assessment: Within Functional Limits LUE Assessment LUE Assessment: Within Functional Limits   See Function Navigator for Current Functional Status.  Leotis Shames [facility] 09/15/2016, 9:17 AM

## 2016-09-15 NOTE — Progress Notes (Signed)
Physical Therapy Discharge Summary  Patient Details  Name: [name] [name] MRN: [id number] Date of Birth: Jun 19, 1941  Today's Date: 09/15/2016 PT Individual Time: 1000-1053 and 1420-1505 PT Individual Time Calculation (min): 53 min and 45 min    Patient has met 7 of 7 long term goals due to improved activity tolerance, improved balance, improved postural control, increased strength, increased range of motion, decreased pain, ability to compensate for deficits, improved attention and improved awareness.  Patient to discharge at a wheelchair level Supervision.   Patient's care partner is independent to provide the necessary physical and cognitive assistance at discharge.  Reasons goals not met: NA  Recommendation:  Patient will benefit from ongoing skilled PT services in home health setting to continue to advance safe functional mobility, address ongoing impairments in strength, standing balance, activity tolerance, functional recall, and minimize fall risk.  Equipment: manual wheelchair with elevating leg rests  Reasons for discharge: treatment goals met and discharge from hospital  Patient/family agrees with progress made and goals achieved: Yes  Skilled Therapeutic Intervention Treatment 1: Focus on continued family training with patient and wife for bed mobility, stand pivot transfers using RW, simulated car transfer using RW, wheelchair parts management, and squat pivot transfer to low compliant couch surface. Wife provided safe and appropriate supervision and verbal cues to patient with above tasks and only needed cues to recall need to move foley catheter with patient as this is new and she has not practiced this until today. She required increased time to don/doff elevating leg rests on wheelchair. Patient propelled wheelchair throughout unit using BUE with supervision with increased time. Patient left sitting in wheelchair with wife present.   Treatment 2: Focus on supine > sit with  mod I, squat pivot transfers from bed to wheelchair and wheelchair to and from outdoor bench x 2 with supervision and assist for wheelchair setup, community wheelchair propulsion using BUE throughout hospital, on/off elevators, over thresholds, and outdoors on uneven brick and concrete surfaces with supervision. Demonstrated wheelchair parts management to daughter who was able to return demonstrate with increased time. Reviewed pressure relief from wheelchair and safe activities for patient to engage in at home in sitting > standing using RW or counter for support. Patient and family with no further questions/concerns regarding discharge home planned tomorrow.   PT Discharge Precautions/Restrictions Restrictions Weight Bearing Restrictions: Yes RLE Weight Bearing: Weight bearing as tolerated LLE Weight Bearing: Non weight bearing Other Position/Activity Restrictions: WBAT RLE for transfers only Pain Pain Assessment Pain Assessment: No/denies pain Vision/Perception   No change from baseline  Cognition Overall Cognitive Status: Within Functional Limits for tasks assessed Orientation Level: Oriented X4 Attention: Sustained;Selective Sustained Attention: Appears intact Selective Attention: Impaired Memory: Appears intact Awareness: Appears intact Problem Solving: Appears intact Comments: Pt's family report that he is back at baseline with some "mild forgetfulness" at baseline, wife to provide support when pt is discharged Sensation Sensation Light Touch: Appears Intact Stereognosis: Appears Intact Hot/Cold: Appears Intact Proprioception: Appears Intact Coordination Gross Motor Movements are Fluid and Coordinated: Yes Fine Motor Movements are Fluid and Coordinated: Yes Motor  Motor Motor: Within Functional Limits Motor - Skilled Clinical Observations: generalized motor weakness  Mobility Bed Mobility Bed Mobility: Rolling Right;Rolling Left;Supine to Sit;Sit to Supine Rolling  Right: 6: Modified independent (Device/Increase time) Rolling Left: 6: Modified independent (Device/Increase time) Supine to Sit: 6: Modified independent (Device/Increase time) Sit to Supine: 6: Modified independent (Device/Increase time) Transfers Transfers: Yes Stand Pivot Transfers: 5: Supervision Squat Pivot Transfers:  5: Supervision;With armrests Locomotion  Ambulation Ambulation: No Gait Gait: No Stairs / Additional Locomotion Stairs: No Wheelchair Mobility Wheelchair Mobility: Yes Wheelchair Assistance: 5: Careers information officer: Both upper extremities Wheelchair Parts Management: Needs assistance Distance: > 150 ft  Trunk/Postural Assessment  Cervical Assessment Cervical Assessment: Within Functional Limits Thoracic Assessment Thoracic Assessment: Within Functional Limits Lumbar Assessment Lumbar Assessment: Within Functional Limits Postural Control Postural Control: Within Functional Limits  Balance Dynamic Sitting Balance Dynamic Sitting - Level of Assistance: 6: Modified independent (Device/Increase time) Dynamic Standing Balance Dynamic Standing - Level of Assistance: 5: Stand by assistance Extremity Assessment  RUE Assessment RUE Assessment: Within Functional Limits LUE Assessment LUE Assessment: Within Functional Limits RLE Assessment RLE Assessment: Within Functional Limits LLE Assessment LLE Assessment: Within Functional Limits   See Function Navigator for Current Functional Status.  [name] [name], Murray Hodgkins 09/15/2016, 10:18 AM

## 2016-09-16 LAB — PROTIME-INR
INR: 2.21
PROTHROMBIN TIME: 24.9 s — AB (ref 11.4–15.2)

## 2016-09-16 MED ORDER — WARFARIN SODIUM 7.5 MG PO TABS: 7.5000 mg | ORAL_TABLET | ORAL | Status: DC

## 2016-09-16 MED ORDER — ALPRAZOLAM 0.25 MG PO TABS: 0.2500 mg | ORAL_TABLET | Freq: Three times a day (TID) | ORAL | 0 refills | Status: DC | PRN

## 2016-09-16 MED ORDER — BUMETANIDE 1 MG PO TABS: 1.0000 mg | ORAL_TABLET | Freq: Every day | ORAL | 1 refills | Status: DC

## 2016-09-16 MED ORDER — WARFARIN SODIUM 10 MG PO TABS: 10.0000 mg | ORAL_TABLET | ORAL | 1 refills | Status: DC

## 2016-09-16 MED ORDER — POTASSIUM CHLORIDE CRYS ER 20 MEQ PO TBCR
20.0000 meq | EXTENDED_RELEASE_TABLET | Freq: Every day | ORAL | 0 refills | Status: DC
Start: 2016-09-16 — End: 2016-10-20

## 2016-09-16 MED ORDER — GABAPENTIN 100 MG PO CAPS: 100.0000 mg | ORAL_CAPSULE | Freq: Every day | ORAL | 1 refills | Status: DC

## 2016-09-16 MED ORDER — NIACIN ER 500 MG PO TBCR: 1500.0000 mg | EXTENDED_RELEASE_TABLET | Freq: Every day | ORAL | 0 refills | Status: AC

## 2016-09-16 MED ORDER — SENNOSIDES-DOCUSATE SODIUM 8.6-50 MG PO TABS: 2.0000 | ORAL_TABLET | Freq: Two times a day (BID) | ORAL | Status: DC

## 2016-09-16 MED ORDER — WARFARIN SODIUM 5 MG PO TABS: ORAL_TABLET | ORAL | 1 refills | Status: DC

## 2016-09-16 MED ORDER — ROSUVASTATIN CALCIUM 20 MG PO TABS: 20.0000 mg | ORAL_TABLET | Freq: Every day | ORAL | 5 refills | Status: DC

## 2016-09-16 MED ORDER — NITROGLYCERIN 0.4 MG SL SUBL: 0.4000 mg | SUBLINGUAL_TABLET | SUBLINGUAL | 10 refills | Status: AC | PRN

## 2016-09-16 MED ORDER — LEVOTHYROXINE SODIUM 112 MCG PO TABS: 112.0000 ug | ORAL_TABLET | Freq: Every day | ORAL | 5 refills | Status: DC

## 2016-09-16 MED ORDER — WARFARIN SODIUM 7.5 MG PO TABS: 7.5000 mg | ORAL_TABLET | ORAL | 1 refills | Status: DC

## 2016-09-16 MED ORDER — LINACLOTIDE 145 MCG PO CAPS: 145.0000 ug | ORAL_CAPSULE | Freq: Every day | ORAL | 1 refills | Status: DC

## 2016-09-16 MED ORDER — METOPROLOL TARTRATE 25 MG PO TABS
12.5000 mg | ORAL_TABLET | Freq: Two times a day (BID) | ORAL | 0 refills | Status: DC
Start: 2016-09-16 — End: 2016-11-15

## 2016-09-16 MED ORDER — TAMSULOSIN HCL 0.4 MG PO CAPS: 0.8000 mg | ORAL_CAPSULE | Freq: Every day | ORAL | 1 refills | Status: DC

## 2016-09-16 MED ORDER — LANSOPRAZOLE 15 MG PO CPDR: 15.0000 mg | DELAYED_RELEASE_CAPSULE | Freq: Every day | ORAL | 5 refills | Status: AC

## 2016-09-16 MED ORDER — WARFARIN SODIUM 5 MG PO TABS: 10.0000 mg | ORAL_TABLET | ORAL | Status: DC

## 2016-09-16 MED ORDER — TRIAMTERENE-HCTZ 37.5-25 MG PO TABS: 1.0000 | ORAL_TABLET | Freq: Every morning | ORAL | 5 refills | Status: DC

## 2016-09-16 NOTE — Progress Notes (Signed)
ANTICOAGULATION CONSULT NOTE - Follow Up Consult  Pharmacy Consult for coumadin and lovenox Indication: DVT  Allergies  Allergen Reactions  . Imitrex [Sumatriptan] Nausea And Vomiting and Other (See Comments)    "made me like I was having a stoke"  . Lipitor [Atorvastatin] Nausea And Vomiting and Other (See Comments)    INTOLERANCE > MYALGIAS "couldn't get out of the bed ( pt had to crawl out of bed ) I hurt [place] bad"    Patient Measurements: Height: '5\' 6"'$  (167.6 cm) Weight: 185 lb 6.4 oz (84.1 kg) IBW/kg (Calculated) : 63.8 Heparin Dosing Weight:   Vital Signs: Temp: 97.7 F (36.5 C) (03/30 0549) Temp Source: Oral (03/30 0549) BP: 134/50 (03/30 0549) Pulse Rate: 62 (03/30 0549)  Labs:  Recent Labs  09/14/16 0545 09/15/16 0457 09/16/16 0440  HGB 12.1*  --   --   HCT 36.2*  --   --   PLT 270  --   --   LABPROT 18.9* 22.1* 24.9*  INR 1.57 1.90 2.21    Estimated Creatinine Clearance: 58.4 mL/min (by C-G formula based on SCr of 1.06 mg/dL).   Medications:  Scheduled:  . bumetanide  1 mg Oral Daily  . feeding supplement (ENSURE ENLIVE)  237 mL Oral Q1500  . gabapentin  100 mg Oral QHS  . levothyroxine  112 mcg Oral QAC breakfast  . linaclotide  145 mcg Oral QAC breakfast  . metoprolol  12.5 mg Oral BID  . nystatin   Topical BID  . pantoprazole  40 mg Oral Daily  . polyethylene glycol  17 g Oral BID  . potassium chloride  20 mEq Oral Daily  . rosuvastatin  20 mg Oral q1800  . senna-docusate  2 tablet Oral BID  . sodium chloride  1 spray Each Nare QID  . triamterene-hydrochlorothiazide  1 tablet Oral Daily  . [START ON 09/17/2016] warfarin  10 mg Oral Once per day on Tue Thu Sat  . warfarin  7.5 mg Oral Once per day on Sun Mon Wed Fri  . Warfarin - Pharmacist Dosing Inpatient   Does not apply q1800   Infusions:  . lactated ringers 10 mL/hr at 09/09/16 8756    Assessment: 76 yo male with DVT is currently on therapeutic coumadin.  Also on lovenox treatment  dose.  INR today is 2.21.  Goal of Therapy:  INR 2-3 Monitor platelets by anticoagulation protocol: Yes   Plan:  - d/c lovenox - d/c CBC - continue coumadin 7.5 mg every day except 10 mg on TThSa.  Rec to have INR recheck by home health on Monday to reassess dosing.  Delayne Sanzo, Tsz-Yin 09/16/2016,8:17 AM

## 2016-09-16 NOTE — Progress Notes (Signed)
Pt d/c to home with belongings.Pt and wife verbalized understanding of d/c instructions provided by Silvestre Mesi, PA-C.  Also instructed wife and pt about foley care as well and emptying bag and catheter maintenance. Continue plan of care.

## 2016-09-16 NOTE — Progress Notes (Signed)
[facility]     PROGRESS NOTE  Subjective/Complaints:  Pt ready for d/c  ROS: pt denies nausea, vomiting, diarrhea, cough, shortness of breath or chest pain   Objective: Vital Signs: Blood pressure (!) 134/50, pulse 62, temperature 97.7 F (36.5 C), temperature source Oral, resp. rate 18, height '5\' 6"'$  (1.676 m), weight 84.1 kg (185 lb 6.4 oz), SpO2 97 %. No results found.  Recent Labs  09/14/16 0545  WBC 5.2  HGB 12.1*  HCT 36.2*  PLT 270   No results for input(s): NA, K, CL, GLUCOSE, BUN, CREATININE, CALCIUM in the last 72 hours.  Invalid input(s): CO CBG (last 3)  No results for input(s): GLUCAP in the last 72 hours.  Wt Readings from Last 3 Encounters:  08/31/16 84.1 kg (185 lb 6.4 oz)  08/22/16 71.7 kg (158 lb)  08/18/16 71.8 kg (158 lb 6 oz)    Physical Exam:  BP (!) 134/50 (BP Location: Left Arm)   Pulse 62   Temp 97.7 F (36.5 C) (Oral)   Resp 18   Ht '5\' 6"'$  (1.676 m)   Wt 84.1 kg (185 lb 6.4 oz)   SpO2 97%   BMI 29.92 kg/m  Constitutional: He appears well-developed and well-nourished.  HENT: Normocephalic and atraumatic.  Eyes: EOMI. No discharge.  Cardiovascular: RRR Respiratory: normal effort GI: Soft. Bowel sounds are normal.  Musculoskeletal: He exhibits edema and tenderness.   Neurological: He is alert.  HOH Alert, oriented Motor: RUE 4+/5 proximal to distal LUE: 4/5 proximal to distal RLE: 3-/5 proximal to distal LLE: 3/5 HF, 3+/5 KE, 4-/5 ADF/PF (sl incr) Skin: Skin is warm and dry.  Psychiatric: His mood appears pleasant and appropriate   Assessment/Plan: 1. Functional deficits secondary to closed pelvic ring fracture with retroperitoneal hemorrhage Stable for D/C today F/u PCP in 3-4 weeks F/u PM&R 2 weeks See D/C summary See D/C instructions Function:  Bathing Bathing position   Position: Shower  Bathing parts Body parts bathed by patient: Right arm, Left arm, Chest, Abdomen, Front perineal  area, Right upper leg, Left upper leg, Buttocks, Right lower leg, Left lower leg, Back Body parts bathed by helper: Back  Bathing assist Assist Level: Supervision or verbal cues      Upper Body Dressing/Undressing Upper body dressing   What is the patient wearing?: Pull over shirt/dress     Pull over shirt/dress - Perfomed by patient: Thread/unthread right sleeve, Put head through opening, Thread/unthread left sleeve, Pull shirt over trunk          Upper body assist Assist Level: No help, No cues   Set up : To obtain clothing/put away  Lower Body Dressing/Undressing Lower body dressing   What is the patient wearing?: Underwear, Pants, Socks, Shoes Underwear - Performed by patient: Thread/unthread right underwear leg, Thread/unthread left underwear leg Underwear - Performed by helper: Pull underwear up/down Pants- Performed by patient: Thread/unthread right pants leg, Thread/unthread left pants leg, Pull pants up/down Pants- Performed by helper: Pull pants up/down   Non-skid slipper socks- Performed by helper: Don/doff right sock, Don/doff left sock Socks - Performed by patient: Don/doff right sock, Don/doff left sock   Shoes - Performed by patient: Don/doff right shoe, Fasten right Shoes - Performed by helper: Fasten right          Lower body assist Assist for lower body dressing: Touching or steadying assistance (Pt > 75%)      Toileting Toileting Toileting activity did not occur: No  continent bowel/bladder event Toileting steps completed by patient: Adjust clothing prior to toileting, Performs perineal hygiene, Adjust clothing after toileting Toileting steps completed by helper: Adjust clothing prior to toileting, Performs perineal hygiene, Adjust clothing after toileting    Toileting assist Assist level: Touching or steadying assistance (Pt.75%)   Transfers Chair/bed transfer   Chair/bed transfer method: Stand pivot Chair/bed transfer assist level: Supervision or  verbal cues Chair/bed transfer assistive device: Armrests, Medical sales representative Ambulation activity did not occur: Safety/medical concerns         Wheelchair   Type: Manual Max wheelchair distance: > 500 ft Assist Level: Supervision or verbal cues  Cognition Comprehension Comprehension assist level: Follows complex conversation/direction with extra time/assistive device  Expression Expression assist level: Expresses complex 90% of the time/cues < 10% of the time  Social Interaction Social Interaction assist level: Interacts appropriately with others with medication or extra time (anti-anxiety, antidepressant).  Problem Solving Problem solving assist level: Solves basic 75 - 89% of the time/requires cueing 10 - 24% of the time  Memory Memory assist level: Recognizes or recalls 75 - 89% of the time/requires cueing 10 - 24% of the time    Medical Problem List and Plan: 1.  Decreased functional mobility secondary to Closed pelvic ring fracture with disorder of left sacroiliac joint with retroperitoneal hemorrhage in the left side of the pelvis with subcutaneous hemorrhage in the left buttocks after fall status post ORIF with sacroiliac screw fixation of left SI joint 08/25/2016. Weightbearing as tolerated right lower extremity for transfers only nonweightbearing left lower extremity  Cont CIR plan d/c 2.  Acute DVT:    Vascular study with b/l peroneal and tibial vein dvt's  On Warfarin  Monitor for any bleeding episodes 3. Pain Management: only using tylenol 4. Mood: Provide emotional support 5. Neuropsych: This patient is not capable of making decisions on his own behalf. 6. Skin/Wound Care: Routine skin checks  Nystatin ordered for perineal area, improved 7. Fluids/Electrolytes/Nutrition: Routine I&Os 8. Acute blood loss anemia. Improving  Hb 12.5 on 3/27    Cont to monitor 9. Hypertension.   Lopressor decreased to 50 mg twice a day on 3/19, decreased to 37.5 BID on  3/20, decreased to 12.5 on 3/21  Maxzide 37.5.-25 milligrams daily  Controlled 3/24 10. CAD status post CABG. No chest pain or shortness of breath. Plavix resumed.   On ASA, Coumadin per pharm 11. Atrial fibrillation. Cardiac rate controlled. Continue Lopressor. 12. PVD with multiple revascularization procedures. Continue Plavix. 13. Right middle lobe lung/brain mass.    CTS consulted, bronch/bx completed yesterday, f/u cancer ctr    14. Scrotal edema with urinary retention  Failed voiding trial, Foley replaced  Bumex started 3/22  Continue Flomax 15. Hypothyroidism. Synthroid 16. Hyperlipidemia. Crestor 17. Constipation. Laxative assistance 18. Hyponatremia  Na+ 132 on 3/26   Cont to monitor 19. Hypokalemia- resolved  K+ 4.0 on 3/26  Supplementing   20. Hypoalbuminemia  Supplement initiated 3/15 21. Sleep disturbance  Trazodone PRN started 3/15 22. Leukocytosis: Resolved 23. Confusion: nearly resolved  Multifactorial ?UTI +/- meds, +/- sleep +/- TBI 24. Urinary retention developing hematuria, insert foley check UA, D/C flomax and urecholine Nitrite neg, rare bact +RBC an WBC  LOS (Days) 16 A FACE TO FACE EVALUATION WAS PERFORMED  KIRSTEINS,ANDREW E 09/16/2016 7:42 AM

## 2016-09-18 LAB — URINE CULTURE

## 2016-09-19 ENCOUNTER — Telehealth: Payer: Self-pay | Admitting: *Deleted

## 2016-09-19 DIAGNOSIS — S329XXD Fracture of unspecified parts of lumbosacral spine and pelvis, subsequent encounter for fracture with routine healing: Secondary | ICD-10-CM | POA: Diagnosis not present

## 2016-09-19 DIAGNOSIS — I739 Peripheral vascular disease, unspecified: Secondary | ICD-10-CM | POA: Diagnosis not present

## 2016-09-19 DIAGNOSIS — I509 Heart failure, unspecified: Secondary | ICD-10-CM | POA: Diagnosis not present

## 2016-09-19 DIAGNOSIS — Z466 Encounter for fitting and adjustment of urinary device: Secondary | ICD-10-CM | POA: Diagnosis not present

## 2016-09-19 DIAGNOSIS — N390 Urinary tract infection, site not specified: Secondary | ICD-10-CM | POA: Diagnosis not present

## 2016-09-19 DIAGNOSIS — I82409 Acute embolism and thrombosis of unspecified deep veins of unspecified lower extremity: Secondary | ICD-10-CM | POA: Diagnosis not present

## 2016-09-19 DIAGNOSIS — I4891 Unspecified atrial fibrillation: Secondary | ICD-10-CM | POA: Diagnosis not present

## 2016-09-19 DIAGNOSIS — W11XXXD Fall on and from ladder, subsequent encounter: Secondary | ICD-10-CM | POA: Diagnosis not present

## 2016-09-19 DIAGNOSIS — D649 Anemia, unspecified: Secondary | ICD-10-CM | POA: Diagnosis not present

## 2016-09-19 DIAGNOSIS — Z7901 Long term (current) use of anticoagulants: Secondary | ICD-10-CM | POA: Diagnosis not present

## 2016-09-19 DIAGNOSIS — C8521 Mediastinal (thymic) large B-cell lymphoma, lymph nodes of head, face, and neck: Secondary | ICD-10-CM | POA: Diagnosis not present

## 2016-09-19 DIAGNOSIS — Z5181 Encounter for therapeutic drug level monitoring: Secondary | ICD-10-CM | POA: Diagnosis not present

## 2016-09-19 MED ORDER — CIPROFLOXACIN HCL 250 MG PO TABS: 250.0000 mg | ORAL_TABLET | Freq: Two times a day (BID) | ORAL | 0 refills | Status: AC

## 2016-09-19 NOTE — Telephone Encounter (Signed)
I sent in the cipro to pharmacy ( it can be ordered electronically).  Before I call MR Maggio--There is a warning about hypo prothrombin time with warfarin.  Who is managing his coumadin and is it brief enough time that it will not need adjustment?

## 2016-09-19 NOTE — Telephone Encounter (Signed)
Because of organism resistance to other antibiotics, he will need to proceed with Cipro.  [name] [name] and Pharmacist notified.

## 2016-09-19 NOTE — Telephone Encounter (Signed)
Transitional Care call-Spoke with [name] [name]    1. Are you/is patient experiencing any problems since coming home? Are there any questions regarding any aspect of care? No 2. Are there any questions regarding medications administration/dosing? Are meds being taken as prescribed? Patient should review meds with caller to confirm They have all medications.  HH has been out and reviewed. 3. Have there been any falls? No  4. Has Home Health been to the house and/or have they contacted you? If not, have you tried to contact them? Can we help you contact them? Yes they have seen 2 people and a third is coming out today. 5. Are bowels and bladder emptying properly? Are there any unexpected incontinence issues? If applicable, is patient following bowel/bladder programs? Bowels are ok, he still has Foley and has a UTI.  We called in antibiotic today. She is going to make appt with Urology about foley. 6. Any fevers, problems with breathing, unexpected pain? No 7. Are there any skin problems or new areas of breakdown? No 8. Has the patient/family member arranged specialty MD follow up (ie cardiology/neurology/renal/surgical/etc)?  Can we help arrange? No she is handling these appts 9. Does the patient need any other services or support that we can help arrange? No 10. Are caregivers following through as expected in assisting the patient? Yes 11. Has the patient quit smoking, drinking alcohol, or using drugs as recommended?n/a  Appointment Thursday 09/29/16 '@9'$ :00 arrive by 8:30 am to see Dr Posey Pronto. [place]

## 2016-09-19 NOTE — Telephone Encounter (Signed)
-----   Message from Charlett Blake, MD sent at 09/18/2016 10:23 AM EDT ----- Please call in Cipro 250 mg twice a day times 5 days, #10

## 2016-09-19 NOTE — Progress Notes (Signed)
Urine study returned 09/15/2016 greater than 100,000 Pseudomonas. Will begin Cipro 250 mg twice a day 7 days. Called and discussed with patient and family on plan of care for UTI.

## 2016-09-20 ENCOUNTER — Encounter: Payer: Self-pay | Admitting: Internal Medicine

## 2016-09-20 ENCOUNTER — Other Ambulatory Visit (HOSPITAL_BASED_OUTPATIENT_CLINIC_OR_DEPARTMENT_OTHER): Payer: Medicare Other

## 2016-09-20 ENCOUNTER — Other Ambulatory Visit: Payer: Self-pay | Admitting: Medical Oncology

## 2016-09-20 ENCOUNTER — Encounter: Payer: Self-pay | Admitting: Radiation Oncology

## 2016-09-20 ENCOUNTER — Telehealth: Payer: Self-pay | Admitting: Internal Medicine

## 2016-09-20 ENCOUNTER — Encounter (HOSPITAL_COMMUNITY): Payer: Self-pay

## 2016-09-20 ENCOUNTER — Ambulatory Visit (HOSPITAL_BASED_OUTPATIENT_CLINIC_OR_DEPARTMENT_OTHER): Payer: Medicare Other | Admitting: Internal Medicine

## 2016-09-20 DIAGNOSIS — R918 Other nonspecific abnormal finding of lung field: Secondary | ICD-10-CM

## 2016-09-20 DIAGNOSIS — C3411 Malignant neoplasm of upper lobe, right bronchus or lung: Secondary | ICD-10-CM | POA: Diagnosis not present

## 2016-09-20 DIAGNOSIS — E46 Unspecified protein-calorie malnutrition: Secondary | ICD-10-CM

## 2016-09-20 DIAGNOSIS — C342 Malignant neoplasm of middle lobe, bronchus or lung: Secondary | ICD-10-CM | POA: Insufficient documentation

## 2016-09-20 DIAGNOSIS — C3491 Malignant neoplasm of unspecified part of right bronchus or lung: Secondary | ICD-10-CM

## 2016-09-20 HISTORY — DX: Malignant neoplasm of unspecified part of right bronchus or lung: C34.91

## 2016-09-20 LAB — CBC WITH DIFFERENTIAL/PLATELET
BASO%: 0.9 % (ref 0.0–2.0)
Basophils Absolute: 0.1 10*3/uL (ref 0.0–0.1)
EOS ABS: 0.1 10*3/uL (ref 0.0–0.5)
EOS%: 1.2 % (ref 0.0–7.0)
HEMATOCRIT: 39.1 % (ref 38.4–49.9)
HEMOGLOBIN: 13.5 g/dL (ref 13.0–17.1)
LYMPH%: 16.9 % (ref 14.0–49.0)
MCH: 29.9 pg (ref 27.2–33.4)
MCHC: 34.5 g/dL (ref 32.0–36.0)
MCV: 86.7 fL (ref 79.3–98.0)
MONO#: 0.6 10*3/uL (ref 0.1–0.9)
MONO%: 7.4 % (ref 0.0–14.0)
NEUT%: 73.6 % (ref 39.0–75.0)
NEUTROS ABS: 5.5 10*3/uL (ref 1.5–6.5)
PLATELETS: 251 10*3/uL (ref 140–400)
RBC: 4.51 10*6/uL (ref 4.20–5.82)
RDW: 15.3 % — AB (ref 11.0–14.6)
WBC: 7.5 10*3/uL (ref 4.0–10.3)
lymph#: 1.3 10*3/uL (ref 0.9–3.3)

## 2016-09-20 LAB — COMPREHENSIVE METABOLIC PANEL
ALBUMIN: 3.9 g/dL (ref 3.5–5.0)
ALK PHOS: 145 U/L (ref 40–150)
ALT: 20 U/L (ref 0–55)
ANION GAP: 12 meq/L — AB (ref 3–11)
AST: 26 U/L (ref 5–34)
BILIRUBIN TOTAL: 0.89 mg/dL (ref 0.20–1.20)
BUN: 43 mg/dL — ABNORMAL HIGH (ref 7.0–26.0)
CO2: 28 mEq/L (ref 22–29)
Calcium: 10.3 mg/dL (ref 8.4–10.4)
Chloride: 95 mEq/L — ABNORMAL LOW (ref 98–109)
Creatinine: 1.2 mg/dL (ref 0.7–1.3)
EGFR: 58 mL/min/{1.73_m2} — AB (ref >90)
GLUCOSE: 136 mg/dL (ref 70–140)
Potassium: 3.7 mEq/L (ref 3.5–5.1)
Sodium: 134 mEq/L — ABNORMAL LOW (ref 136–145)
TOTAL PROTEIN: 7.9 g/dL (ref 6.4–8.3)

## 2016-09-20 NOTE — Telephone Encounter (Signed)
Gave patient AVS and calender per 09/20/2016 los. [facility] to contact patient with Pet Scan schedule.Unable to schedule lab and MD appt to 7/63 do to conflicting appt. Patient is aware of appts times and will cancel the outside appt to accommodate 1st Chemo. Melanie in Radiation scheduling scheduled consult per referral .

## 2016-09-20 NOTE — Progress Notes (Signed)
[place] [phone]   [phone]  CONSULT NOTE  REFERRING PHYSICIAN: Dr. Lanelle Bal  REASON FOR CONSULTATION:  76 years old white male recently diagnosed with lung cancer.  HPI [name] [name] is a 76 y.o. male with past medical history significant for multiple medical problems including history of coronary artery disease status post triple bypass surgery in 1995, abdominal aortic aneurysm, diverticulosis, atrial fibrillation, hypertension, hiatal hernia, hypothyroidism, osteoporosis, dyslipidemia, renal artery stenosis and long history of smoking but quit in 1994. The patient was on a ladder working at his house when he fell down from 20 feet height. He was evaluated at the emergency department at [facility] and CT scan of the head and neck were performed on 08/22/2016. CT of the head without contrast showed abnormal anterior left frontal lobe with a suspicious for 2 cm brain mass with mild surrounding vasogenic edema but MRI of the brain was recommended which was later performed on 09/02/2016 and showed the left frontal lesion favoring a subacute to chronic hemorrhagic infarct or cortical confusion but malignancy is not completely excluded. During his evaluation at [facility] CT scan of the chest, abdomen and pelvis were performed on 08/22/2016 and it showed a lobe with soft tissue stranding into the periphery, consistent with primary carcinoma of the lung. There is a vague the 0.7 cm area of abnormal density in the posterior aspect of the left upper lobe and 2 small nodular densities in the right upper lobe measuring 0.4 and 0.2 cm. The scan also showed 3.7 x 2.3 cm enlarging subcarinal lymph node. The patient was seen by Dr. Servando Snare and on 09/09/2016 he underwent bronchoscopy with the EBUS and biopsy of level 7 lymph nodes. The final pathology ([id number]) showed malignant cells consistent with adenocarcinoma. PDL 1 expression was  50%. Other studies are still pending. Dr. Servando Snare kindly referred the patient to me today for evaluation and recommendation regarding treatment of his condition. When seen today the patient is feeling fine except for weakness in the lower extremities more on the left side. He also lost around 17 pounds in the last few weeks. He denied having any chest pain or shortness breath but has mild productive cough with no hemoptysis. He has no nausea, vomiting but has occasional diarrhea. He also has urinary retention and currently wearing Foley's catheter. He denied having any headache or visual changes. Family history significant for 2 brothers with lung cancer, mother had hypertension and TIA and father had heart disease. The patient is married and has 4 children, 3 living. He was accompanied today by his wife [name], son Myrle Sheng and daughter-in-law Brayton Layman. The patient used to work in a [employer]. He has a history of smoking 1 pack per day for around 35 years and quit in 1994. He has no history of alcohol or drug abuse.   HPI  Past Medical History:  Diagnosis Date  . Adenocarcinoma of right lung, stage 3 ([place]) 09/20/2016  . Atrial fibrillation ([place])    no hx of reported at preop visit of 04/21/11   . CAD (coronary artery disease)   . Carotid artery occlusion    left carotid endarterectomy  . Chronic kidney disease    AAA repair with reimplant of renals   . CHRONIC OBSTRUCTIVE PULMONARY DISEASE    pt denied at visit of 04/21/11   . CORONARY ARTERY DISEASE   . Disorder of left sacroiliac joint 08/24/2016  . DIVERTICULOSIS OF COLON   .  GERD   . H/O hiatal hernia   . Headache(784.0)    Hx: of years of years  . HYPERTENSION   . HYPOTHYROIDISM   . JOINT EFFUSION, KNEE   . KNEE, ARTHRITIS, DEGEN./OSTEO    right knee   . Myocardial infarction    1994   . NEPHROLITHIASIS, HX OF   . OSTEOPOROSIS   . Other dysphagia   . PERIPHERAL VASCULAR DISEASE    AAA - 1994 with reimplant of renals   .  Peripheral vascular disease ([place])    subclavian stenosis PTA - 3/08   . Pure hypercholesterolemia   . Renal artery stenosis [facility])     Past Surgical History:  Procedure Laterality Date  . ABDOMINAL AORTIC ANEURYSM REPAIR     with reimplantation of renals   . CARDIAC CATHETERIZATION     2008  . CAROTID ENDARTERECTOMY Left Jan. 30, 2015   CEA  . CHOLECYSTECTOMY  2000   Gall Bladder  . COLONOSCOPY W/ BIOPSIES AND POLYPECTOMY     Hx: of  . CORONARY ARTERY BYPASS GRAFT     1995  . CORONARY STENT PLACEMENT     Hx: of  . ENDARTERECTOMY Left 07/19/2013   Procedure: ENDARTERECTOMY CAROTID;  Surgeon: Mal Misty, MD;  Location: [place];  Service: Vascular;  Laterality: Left;  . GALLBLADDER SURGERY  2004   . JOINT REPLACEMENT     partial knee replacement on left 2002   . KNEE SURGERY    . ORIF PELVIC FRACTURE Left 08/25/2016   Procedure: OPEN REDUCTION INTERNAL FIXATION (ORIF) PELVIC FRACTURE; plate on front, SI screw on the back;  Surgeon: Altamese Mayville, MD;  Location: [place];  Service: Orthopedics;  Laterality: Left;  . OTHER SURGICAL HISTORY     left subclavian stenosis surgery PTA 08/2006   . OTHER SURGICAL HISTORY     carotid surgery on right 2004   . TOTAL KNEE ARTHROPLASTY  04/29/2011   Procedure: TOTAL KNEE ARTHROPLASTY;  Surgeon: Gearlean Alf;  Location: [facility];  Service: Orthopedics;  Laterality: Right;  [name] VASCULAR SURGERY     AAA  . VASECTOMY  1973  . VIDEO BRONCHOSCOPY WITH ENDOBRONCHIAL ULTRASOUND [name] 09/09/2016   Procedure: VIDEO BRONCHOSCOPY WITH ENDOBRONCHIAL ULTRASOUND;  Surgeon: Grace Isaac, MD;  Location: [facility] OR;  Service: Thoracic;  Laterality: [name];    Family History  Problem Relation Age of Onset  . Hypertension Mother   . Heart disease Father   . Heart attack Father   . Cancer Brother     Lung  . Diabetes Sister     Social History Social History  Substance Use Topics  . Smoking status: Former Smoker    Types: Cigarettes    Quit date: 06/20/1992  .  Smokeless tobacco: Never Used  . Alcohol use No    Allergies  Allergen Reactions  . Imitrex [Sumatriptan] Nausea And Vomiting and Other (See Comments)    "made me like I was having a stoke"  . Lipitor [Atorvastatin] Nausea And Vomiting and Other (See Comments)    INTOLERANCE > MYALGIAS "couldn't get out of the bed ( pt had to crawl out of bed ) I hurt so bad"    Current Outpatient Prescriptions  Medication Sig Dispense Refill  . acetaminophen (TYLENOL) 325 MG tablet Take 325-650 mg by mouth every 6 (six) hours as needed (for pain).    [name] ALPRAZolam (XANAX) 0.25 MG tablet Take 1 tablet (0.25 mg total) by mouth 3 (three) times daily  as needed for anxiety. 30 tablet 0  . bumetanide (BUMEX) 1 MG tablet Take 1 tablet (1 mg total) by mouth daily. 30 tablet 1  . ciprofloxacin (CIPRO) 250 MG tablet Take 1 tablet (250 mg total) by mouth 2 (two) times daily. 10 tablet 0  . gabapentin (NEURONTIN) 100 MG capsule Take 1 capsule (100 mg total) by mouth at bedtime. 30 capsule 1  . lansoprazole (PREVACID) 15 MG capsule Take 1 capsule (15 mg total) by mouth daily. 30 capsule 5  . levothyroxine (SYNTHROID, LEVOTHROID) 112 MCG tablet Take 1 tablet (112 mcg total) by mouth daily. 30 tablet 5  . linaclotide (LINZESS) 145 MCG CAPS capsule Take 1 capsule (145 mcg total) by mouth daily before breakfast. 30 capsule 1  . metoprolol (LOPRESSOR) 25 MG tablet Take 0.5 tablets (12.5 mg total) by mouth 2 (two) times daily. 60 tablet 0  . niacin (SLO-NIACIN) 500 MG tablet Take 3 tablets (1,500 mg total) by mouth at bedtime. 30 tablet 0  . nitroGLYCERIN (NITROSTAT) 0.4 MG SL tablet Place 1 tablet (0.4 mg total) under the tongue every 5 (five) minutes as needed for chest pain. 25 tablet 10  . potassium chloride SA (K-DUR,KLOR-CON) 20 MEQ tablet Take 1 tablet (20 mEq total) by mouth daily. 30 tablet 0  . rosuvastatin (CRESTOR) 20 MG tablet Take 1 tablet (20 mg total) by mouth daily. 30 tablet 5  . senna-docusate (SENOKOT-S)  8.6-50 MG tablet Take 2 tablets by mouth 2 (two) times daily.    [name] triamterene-hydrochlorothiazide (MAXZIDE-25) 37.5-25 MG tablet Take 1 tablet by mouth every morning. 30 tablet 5  . warfarin (COUMADIN) 10 MG tablet Take 1 tablet (10 mg total) by mouth every Tuesday, Thursday, and Saturday at 6 PM. 15 tablet 1  . warfarin (COUMADIN) 5 MG tablet 1-1/2 tablet every day except 2 tablets Tuesday Thursday Saturday 90 tablet 1   No current facility-administered medications for this visit.     Review of Systems  Constitutional: positive for fatigue and weight loss Eyes: negative Ears, nose, mouth, throat, and face: negative Respiratory: positive for cough and sputum Cardiovascular: negative Gastrointestinal: negative Genitourinary:negative Integument/breast: negative Hematologic/lymphatic: negative Musculoskeletal:positive for muscle weakness Neurological: negative Behavioral/Psych: negative Endocrine: negative Allergic/Immunologic: negative  Physical Exam  VOH:YWVPX, healthy, no distress, well developed and malnourished SKIN: skin color, texture, turgor are normal, no rashes or significant lesions HEAD: Normocephalic, No masses, lesions, tenderness or abnormalities EYES: normal, PERRLA, Conjunctiva are pink and non-injected EARS: External ears normal, Canals clear OROPHARYNX:no exudate, no erythema and lips, buccal mucosa, and tongue normal  NECK: supple, no adenopathy, no JVD LYMPH:  no palpable lymphadenopathy, no hepatosplenomegaly LUNGS: clear to auscultation , and palpation HEART: regular rate & rhythm, no murmurs and no gallops ABDOMEN:abdomen soft, non-tender, normal bowel sounds and no masses or organomegaly BACK: Back symmetric, no curvature., No CVA tenderness EXTREMITIES:no joint deformities, effusion, or inflammation, no edema, no skin discoloration  NEURO: alert & oriented x 3 with fluent speech, no focal motor/sensory deficits  PERFORMANCE STATUS: ECOG  1  LABORATORY DATA: Lab Results  Component Value Date   WBC 7.5 09/20/2016   HGB 13.5 09/20/2016   HCT 39.1 09/20/2016   MCV 86.7 09/20/2016   PLT 251 09/20/2016      Chemistry      Component Value Date/Time   NA 134 (L) 09/20/2016 1350   K 3.7 09/20/2016 1350   CL 94 (L) 09/12/2016 1037   CO2 28 09/20/2016 1350   BUN 43.0 (H) 09/20/2016  1350   CREATININE 1.2 09/20/2016 1350      Component Value Date/Time   CALCIUM 10.3 09/20/2016 1350   ALKPHOS 145 09/20/2016 1350   AST 26 09/20/2016 1350   ALT 20 09/20/2016 1350   BILITOT 0.89 09/20/2016 1350       RADIOGRAPHIC STUDIES: Dg Chest 2 View  Result Date: 09/08/2016 CLINICAL DATA:  Preoperative evaluation.  Hypertension. EXAM: CHEST  2 VIEW COMPARISON:  September 06, 2016 FINDINGS: There are scattered areas of mild scarring. There is no edema or consolidation. Heart is borderline enlarged with pulmonary vascularity within normal limits. Patient is status post coronary artery bypass grafting. There is a left subclavian stent. No adenopathy. There is degenerative change in the thoracic spine. There is aortic atherosclerosis. IMPRESSION: Areas of mild scarring. No edema or consolidation. Heart borderline enlarged. Aortic atherosclerosis. Electronically Signed   By: Lowella Grip III M.D.   On: 09/08/2016 19:44   Dg Chest 2 View  Result Date: 09/06/2016 CLINICAL DATA:  Pneumonia. EXAM: CHEST  2 VIEW COMPARISON:  Radiograph of September 02, 2016. FINDINGS: Stable cardiomediastinal silhouette. Atherosclerosis of thoracic aorta is noted. Status post coronary artery bypass graft. No pneumothorax is noted. Mild bilateral pleural effusions are noted. Stable bilateral perihilar and basilar interstitial densities are noted concerning for edema or inflammation. Bony thorax is unremarkable. IMPRESSION: Aortic atherosclerosis. Mild bilateral pleural effusions. Stable bilateral pulmonary edema or inflammation. Electronically Signed   By: Marijo Conception, M.D.   On: 09/06/2016 07:56   Ct Head Wo Contrast  Result Date: 08/22/2016 CLINICAL DATA:  76 year old male status post fall from ladder. Extensive pelvic fractures. Abnormal chest CT today revealing a spiculated right middle lobe lung mass. Initial encounter. EXAM: CT HEAD WITHOUT CONTRAST CT CERVICAL SPINE WITHOUT CONTRAST TECHNIQUE: Multidetector CT imaging of the head and cervical spine was performed following the standard protocol without intravenous contrast. Multiplanar CT image reconstructions of the cervical spine were also generated. COMPARISON:  CT chest abdomen and pelvis from today reported separately. [facility] Brain MRI 07/13/2004. FINDINGS: CT HEAD FINDINGS Brain: Rounded mixed hyper and hypodense somewhat masslike area in the anterior left frontal lobe estimated at 2 cm. Adjacent increased white matter hypodensity in a configuration suggestive of mild vasogenic edema. See series 8, image 43. No significant regional mass effect. No second similar lesion identified elsewhere although there is a punctate perhaps dystrophic calcification in the right frontal lobe at the gray-white matter junction (series 4, image 26). No superimposed intracranial hemorrhage or mass effect. Basilar cisterns are patent. Cavum septum pellucidum variant. No ventriculomegaly. Hypodensity in the right caudate nucleus compatible with chronic small vessel disease. No acute cortically based infarct identified. Vascular: Calcified atherosclerosis at the skull base. No suspicious intracranial vascular hyperdensity. Skull: No skull fracture identified. Bone mineralization appears within normal limits for age. Sinuses/Orbits: Mild to moderate anterior ethmoid and frontoethmoidal recess mucosal thickening. Other paranasal sinuses are clear. The left tympanic cavity and mastoids are clear. There is sclerosis of the right mastoids. There is asymmetric thickening of the right tympanic membrane. Other: Visualized orbit  soft tissues are within normal limits. Mild left posterosuperior convexity scalp contusion or hematoma (series 6, image 76) with intact underlying calvarium. No other scout soft tissue injury identified. CT CERVICAL SPINE FINDINGS Alignment: Preserved cervical lordosis. Cervicothoracic junction alignment is within normal limits. There is trace anterolisthesis of C3 on C4 and C4 on C5 with associated chronic facet arthropathy. Bilateral posterior element alignment is within normal limits. Skull base  and vertebrae: Visualized skull base is intact. No atlanto-occipital dissociation. No cervical spine fracture identified. There is heterogeneous bone mineralization in the cervical spine, including small lucent lesions in the right C4 (coronal image 31) and left C6 (coronal image 36) levels. Soft tissues and spinal canal: No prevertebral fluid or swelling. No visible canal hematoma. Ligamentous hypertrophy about the odontoid. Calcified carotid and vertebral artery atherosclerosis. Evidence of previous bilateral carotid endarterectomy. Extensive calcified proximal great vessel atherosclerosis at the visible thoracic inlet. Disc levels: Moderate to severe chronic facet degeneration greater on the right from the C3 to the C6 level. Disc bulging. Multilevel ligament flavum hypertrophy suspected. Up to mild degenerative cervical spinal stenosis at C3-C4. Upper chest: Visible upper thoracic levels appear intact. Chest findings today are reported separately. IMPRESSION: 1. Abnormal anterior left frontal lobe, but the appearance does not resemble typical traumatic injury to the brain and is instead suspicious in this clinical setting for a 2 cm brain mass with mild surrounding vasogenic edema. Brain MRI without and with contrast when possible would characterize further. 2. No other acute intracranial abnormality is evident. Mild chronic small vessel disease in the right basal ganglia. 3.  No acute fracture or listhesis identified  in the cervical spine. 4. Heterogeneous bone mineralization in the cervical spine could be degenerative in nature but early osseous metastatic disease is difficult to exclude. The head CT findings were reviewed in person with Dr. Doreen Salvage on 08/22/2016 at 1557 hours. Electronically Signed   By: Genevie Ann M.D.   On: 08/22/2016 16:23   Ct Chest W Contrast  Result Date: 08/22/2016 CLINICAL DATA:  Multiple trauma. The patient fell 12 feet from a ladder. EXAM: CT CHEST, ABDOMEN, AND PELVIS WITH CONTRAST TECHNIQUE: Multidetector CT imaging of the chest, abdomen and pelvis was performed following the standard protocol during bolus administration of intravenous contrast. CONTRAST:  100 cc Omnipaque 300 COMPARISON:  Pelvic radiograph dated 08/22/2016 and chest x-ray dated 08/22/2016 FINDINGS: CT CHEST FINDINGS Cardiovascular: Extensive aortic atherosclerosis. Extensive coronary artery calcifications. Prior CABG. Heart size is normal. Mediastinum/Nodes: 3.7 x 2.3 cm enlarged subcarinal lymph node. Thyroid gland, trachea, and esophagus demonstrate no significant findings. Lungs/Pleura: There is a spiculated 2.0 x 2.8 cm mass in the right middle lobe with soft tissue stranding to the periphery, consistent with a primary carcinoma the lung. There is a vague 7 mm area of abnormal density in the posterior aspect of the left upper lobe on image 47 of series 3. There are 2 small nodular densities in the right upper lobe on image number 31 of series 3, 1 is 4 mm and the other is 2 mm. Musculoskeletal: No fractures or other acute abnormalities. CT ABDOMEN PELVIS FINDINGS Hepatobiliary: No focal liver abnormality is seen. Status post cholecystectomy. No biliary dilatation. Pancreas: Unremarkable. No pancreatic ductal dilatation or surrounding inflammatory changes. Spleen: No splenic injury or perisplenic hematoma. Adrenals/Urinary Tract: Adrenal glands are unremarkable. Kidneys are normal, without renal calculi, focal lesion, or  hydronephrosis. There is retroperitoneal hemorrhage around the left side of the bladder. Tear is factor Maxwell findings no hemorrhage contrecoup never mind anterior to a diameter question [name] plaques are Stomach/Bowel: Stomach is within normal limits. Appendix appears normal. No evidence of bowel wall thickening, distention, or inflammatory changes. Vascular/Lymphatic: There is retroperitoneal hemorrhage in the pelvis particularly adjacent to the left common iliac artery. I suspect the bleeding is from the left iliac vein. There is blood in the retroperitoneum anterior to the prostate gland and bladder. No  appreciable free intraperitoneal hemorrhage. Hemorrhagic contusion in the left buttock. Reproductive: Slight enlargement of the prostate gland. Other: There is hemorrhage along the left iliopsoas muscle. Musculoskeletal: There is diastases of the symphysis pubis and of the left sacroiliac joint. There are fractures of the left transverse processes of L2 through S1. IMPRESSION: 1. Disruption of the symphysis pubis and left sacroiliac joint with retroperitoneal hemorrhage in the left side of the pelvis and subcutaneous hemorrhage in the left buttock. I suspect there is an injury to the left external iliac vein. 2. Spiculated mass in the right middle lobe consistent with carcinoma the lung with metastatic adenopathy in the subcarinal region. 3. Several small pulmonary nodules which are too small to characterize but could represent metastases. 4. Fractures of the left transverse processes of L2 through S1. Electronically Signed   By: Lorriane Shire M.D.   On: 08/22/2016 16:19   Ct Cervical Spine Wo Contrast  Result Date: 08/22/2016 CLINICAL DATA:  76 year old male status post fall from ladder. Extensive pelvic fractures. Abnormal chest CT today revealing a spiculated right middle lobe lung mass. Initial encounter. EXAM: CT HEAD WITHOUT CONTRAST CT CERVICAL SPINE WITHOUT CONTRAST TECHNIQUE: Multidetector CT imaging  of the head and cervical spine was performed following the standard protocol without intravenous contrast. Multiplanar CT image reconstructions of the cervical spine were also generated. COMPARISON:  CT chest abdomen and pelvis from today reported separately. [facility] Brain MRI 07/13/2004. FINDINGS: CT HEAD FINDINGS Brain: Rounded mixed hyper and hypodense somewhat masslike area in the anterior left frontal lobe estimated at 2 cm. Adjacent increased white matter hypodensity in a configuration suggestive of mild vasogenic edema. See series 8, image 43. No significant regional mass effect. No second similar lesion identified elsewhere although there is a punctate perhaps dystrophic calcification in the right frontal lobe at the gray-white matter junction (series 4, image 26). No superimposed intracranial hemorrhage or mass effect. Basilar cisterns are patent. Cavum septum pellucidum variant. No ventriculomegaly. Hypodensity in the right caudate nucleus compatible with chronic small vessel disease. No acute cortically based infarct identified. Vascular: Calcified atherosclerosis at the skull base. No suspicious intracranial vascular hyperdensity. Skull: No skull fracture identified. Bone mineralization appears within normal limits for age. Sinuses/Orbits: Mild to moderate anterior ethmoid and frontoethmoidal recess mucosal thickening. Other paranasal sinuses are clear. The left tympanic cavity and mastoids are clear. There is sclerosis of the right mastoids. There is asymmetric thickening of the right tympanic membrane. Other: Visualized orbit soft tissues are within normal limits. Mild left posterosuperior convexity scalp contusion or hematoma (series 6, image 76) with intact underlying calvarium. No other scout soft tissue injury identified. CT CERVICAL SPINE FINDINGS Alignment: Preserved cervical lordosis. Cervicothoracic junction alignment is within normal limits. There is trace anterolisthesis of C3 on  C4 and C4 on C5 with associated chronic facet arthropathy. Bilateral posterior element alignment is within normal limits. Skull base and vertebrae: Visualized skull base is intact. No atlanto-occipital dissociation. No cervical spine fracture identified. There is heterogeneous bone mineralization in the cervical spine, including small lucent lesions in the right C4 (coronal image 31) and left C6 (coronal image 36) levels. Soft tissues and spinal canal: No prevertebral fluid or swelling. No visible canal hematoma. Ligamentous hypertrophy about the odontoid. Calcified carotid and vertebral artery atherosclerosis. Evidence of previous bilateral carotid endarterectomy. Extensive calcified proximal great vessel atherosclerosis at the visible thoracic inlet. Disc levels: Moderate to severe chronic facet degeneration greater on the right from the C3 to the C6 level. Disc  bulging. Multilevel ligament flavum hypertrophy suspected. Up to mild degenerative cervical spinal stenosis at C3-C4. Upper chest: Visible upper thoracic levels appear intact. Chest findings today are reported separately. IMPRESSION: 1. Abnormal anterior left frontal lobe, but the appearance does not resemble typical traumatic injury to the brain and is instead suspicious in this clinical setting for a 2 cm brain mass with mild surrounding vasogenic edema. Brain MRI without and with contrast when possible would characterize further. 2. No other acute intracranial abnormality is evident. Mild chronic small vessel disease in the right basal ganglia. 3.  No acute fracture or listhesis identified in the cervical spine. 4. Heterogeneous bone mineralization in the cervical spine could be degenerative in nature but early osseous metastatic disease is difficult to exclude. The head CT findings were reviewed in person with Dr. Doreen Salvage on 08/22/2016 at 1557 hours. Electronically Signed   By: Genevie Ann M.D.   On: 08/22/2016 16:23   [name] [name] Contrast  Result  Date: 09/02/2016 CLINICAL DATA:  76 y/o  M; SRS protocol for metastatic lung cancer. EXAM: MRI HEAD WITHOUT AND WITH CONTRAST TECHNIQUE: Multiplanar, multiecho pulse sequences of the brain and surrounding structures were obtained without and with intravenous contrast. CONTRAST:  58m MULTIHANCE GADOBENATE DIMEGLUMINE 529 MG/ML IV SOLN COMPARISON:  08/22/2016 CT of the head. FINDINGS: Brain: Left frontal lesion on CT demonstrates gyriform T1 shortening and susceptibility blooming as well as a small region of T2 FLAIR hyperintense signal abnormality in white matter, and no appreciable enhancement after administration of intravenous contrast. No additional focus of abnormal enhancement in the brain is identified. Mild motion degradation of postcontrast sequences. A diffusion weighted imaging there are several foci of diffusion restriction within the right frontal and parietal lobe in a "Border zone" distribution. There is a small subdural collection over the left cerebral convexity with incomplete FLAIR suppression, T1 shortening, and diffusion restriction measuring up to 3 mm in thickness compatible with a subdural hematoma. No significant mass effect on the brain. This is not appreciable on the prior CT of the head and may represent interval hemorrhage. No hydrocephalus. Cavum septum pellucidum. Few punctate foci of susceptibility in the right frontal lobe, right parietal lobe, and right cerebellar hemisphere are compatible with hemosiderin deposition of old microhemorrhage. Vascular: Normal flow voids. Skull and upper cervical spine: Normal marrow signal. Sinuses/Orbits: Negative. Other: None. IMPRESSION: 1. Left frontal lesion on CT demonstrate gyriform T1 shortening and susceptibility blooming without appreciable enhancement on MRI. Findings favor a subacute to chronic hemorrhagic infarct or cortical contusion. Follow-up MRI is recommended with contrast to exclude underlying hemorrhagic metastasis. 2. No definite  enhancing lesion of the brain identified. Moderate motion degradation of postcontrast sequences. 3. Small foci of diffusion restriction in the right frontal and parietal lobes in "border zone" distribution compatible with acute/early subacute infarction. The pattern suggests hypoxia/hypoperfusion. Sequelae of trauma with shear injury is also possible, although no associate hemorrhage is identified. 4. Small subdural hematoma over the left cerebral convexity without significant mass effect, not appreciable on prior CT, may represent interval hemorrhage. These results will be called to the ordering clinician or representative by the Radiologist Assistant, and communication documented in the PACS or zVision Dashboard. Electronically Signed   By: LKristine GarbeM.D.   On: 09/02/2016 18:13   Ct Abdomen Pelvis W Contrast  Result Date: 08/22/2016 CLINICAL DATA:  Multiple trauma. The patient fell 12 feet from a ladder. EXAM: CT CHEST, ABDOMEN, AND PELVIS WITH CONTRAST TECHNIQUE: Multidetector CT  imaging of the chest, abdomen and pelvis was performed following the standard protocol during bolus administration of intravenous contrast. CONTRAST:  100 cc Omnipaque 300 COMPARISON:  Pelvic radiograph dated 08/22/2016 and chest x-ray dated 08/22/2016 FINDINGS: CT CHEST FINDINGS Cardiovascular: Extensive aortic atherosclerosis. Extensive coronary artery calcifications. Prior CABG. Heart size is normal. Mediastinum/Nodes: 3.7 x 2.3 cm enlarged subcarinal lymph node. Thyroid gland, trachea, and esophagus demonstrate no significant findings. Lungs/Pleura: There is a spiculated 2.0 x 2.8 cm mass in the right middle lobe with soft tissue stranding to the periphery, consistent with a primary carcinoma the lung. There is a vague 7 mm area of abnormal density in the posterior aspect of the left upper lobe on image 47 of series 3. There are 2 small nodular densities in the right upper lobe on image number 31 of series 3, 1 is 4  mm and the other is 2 mm. Musculoskeletal: No fractures or other acute abnormalities. CT ABDOMEN PELVIS FINDINGS Hepatobiliary: No focal liver abnormality is seen. Status post cholecystectomy. No biliary dilatation. Pancreas: Unremarkable. No pancreatic ductal dilatation or surrounding inflammatory changes. Spleen: No splenic injury or perisplenic hematoma. Adrenals/Urinary Tract: Adrenal glands are unremarkable. Kidneys are normal, without renal calculi, focal lesion, or hydronephrosis. There is retroperitoneal hemorrhage around the left side of the bladder. Tear is factor Maxwell findings no hemorrhage contrecoup never mind anterior to a diameter question [name] plaques are Stomach/Bowel: Stomach is within normal limits. Appendix appears normal. No evidence of bowel wall thickening, distention, or inflammatory changes. Vascular/Lymphatic: There is retroperitoneal hemorrhage in the pelvis particularly adjacent to the left common iliac artery. I suspect the bleeding is from the left iliac vein. There is blood in the retroperitoneum anterior to the prostate gland and bladder. No appreciable free intraperitoneal hemorrhage. Hemorrhagic contusion in the left buttock. Reproductive: Slight enlargement of the prostate gland. Other: There is hemorrhage along the left iliopsoas muscle. Musculoskeletal: There is diastases of the symphysis pubis and of the left sacroiliac joint. There are fractures of the left transverse processes of L2 through S1. IMPRESSION: 1. Disruption of the symphysis pubis and left sacroiliac joint with retroperitoneal hemorrhage in the left side of the pelvis and subcutaneous hemorrhage in the left buttock. I suspect there is an injury to the left external iliac vein. 2. Spiculated mass in the right middle lobe consistent with carcinoma the lung with metastatic adenopathy in the subcarinal region. 3. Several small pulmonary nodules which are too small to characterize but could represent metastases. 4.  Fractures of the left transverse processes of L2 through S1. Electronically Signed   By: Lorriane Shire M.D.   On: 08/22/2016 16:19   Dg Pelvis Portable  Result Date: 08/25/2016 CLINICAL DATA:  Status post open reduction internal fixation of pelvic ring fracture. EXAM: PORTABLE PELVIS 1-2 VIEWS COMPARISON:  Radiographs of same day. FINDINGS: Status post surgical fixation of pubic symphysis. Also noted is surgical screw placement involving the left sacroiliac joint. No other fracture or dislocation is noted. Hip joints appear normal. IMPRESSION: Status post surgical internal fixation of pubic symphysis and left sacroiliac joint. Electronically Signed   By: Marijo Conception, M.D.   On: 08/25/2016 11:45   Dg Pelvis Portable  Result Date: 08/22/2016 CLINICAL DATA:  Fall from roof.  Right flank and low back pain. EXAM: PORTABLE PELVIS 1-2 VIEWS COMPARISON:  07/13/2004 CT angiogram of the abdomen and pelvis. FINDINGS: There is abnormal widening at the pubic symphysis. There is mild 5 mm elevation of the right pubic  bone relative to the left pubic bone at the pubic symphysis. No appreciable widening at the sacroiliac joints. Questionable vertical lucencies in the superior lateral sacral ala bilaterally. No evidence of hip dislocation on this single frontal view. No suspicious focal osseous lesion. Iliofemoral atherosclerosis bilaterally. IMPRESSION: 1. Mild diastasis and offset at the pubic symphysis suspicious for symphyseal disruption. 2. Questionable vertical lucencies in the superolateral sacral ala bilaterally, cannot exclude nondisplaced sacral fractures. No sacroiliac joint diastasis. 3. Recommend correlation with bony protocol pelvis CT. Electronically Signed   By: Ilona Sorrel M.D.   On: 08/22/2016 15:36   Dg Pelvis Comp Min 3v  Result Date: 08/25/2016 CLINICAL DATA:  ORIF pelvic fracture EXAM: JUDET PELVIS - 3+ VIEW; DG C-ARM 61-120 MIN COMPARISON:  None. FLUOROSCOPY TIME:  53 seconds FINDINGS: Six  intraoperative fluoroscopic spot images are provided. Interval plate and screw ORIF of symphyseal diastases. Interval placement of a cannulated screw transfixing the left sacroiliac joint. IMPRESSION: 1. Interval ORIF of the pubic symphysis and left SI joint. Electronically Signed   By: Kathreen Devoid   On: 08/25/2016 10:04   Dg Chest Port 1 View  Result Date: 09/02/2016 CLINICAL DATA:  Shortness of Breath EXAM: PORTABLE CHEST 1 VIEW COMPARISON:  August 22, 2016 chest radiograph and chest CT FINDINGS: There is patchy airspace opacity in each lower lobe, likely multifocal pneumonia. There is cardiomegaly with pulmonary venous hypertension. No adenopathy. There is aortic atherosclerosis. Patient is status post coronary artery bypass grafting. No evident bone lesions. IMPRESSION: Pulmonary vascular congestion. Probable patchy pneumonia in each lung base. There appears to be slight increased opacity in these areas compared to recent chest radiograph. Cardiac silhouette is stable. There is aortic atherosclerosis. Electronically Signed   By: Lowella Grip III M.D.   On: 09/02/2016 11:38   Dg Chest Port 1 View  Result Date: 08/22/2016 CLINICAL DATA:  Right flank and lower back pain after falling from a ladder 12 feet. EXAM: PORTABLE CHEST 1 VIEW COMPARISON:  Chest x-ray dated 07/17/2013 FINDINGS: The heart size and pulmonary vascularity are normal and the lungs are clear. CABG. Aortic atherosclerosis. No acute bone abnormality. IMPRESSION: No acute abnormalities.  CABG. Aortic atherosclerosis. Electronically Signed   By: Lorriane Shire M.D.   On: 08/22/2016 15:33   Dg C-arm 1-60 Min  Result Date: 08/25/2016 CLINICAL DATA:  ORIF pelvic fracture EXAM: JUDET PELVIS - 3+ VIEW; DG C-ARM 61-120 MIN COMPARISON:  None. FLUOROSCOPY TIME:  53 seconds FINDINGS: Six intraoperative fluoroscopic spot images are provided. Interval plate and screw ORIF of symphyseal diastases. Interval placement of a cannulated screw transfixing  the left sacroiliac joint. IMPRESSION: 1. Interval ORIF of the pubic symphysis and left SI joint. Electronically Signed   By: Kathreen Devoid   On: 08/25/2016 10:04    ASSESSMENT: This is a very pleasant 76 years old white male with at least a stage IIIa (T1b, N2, M0) non-small cell lung cancer, adenocarcinoma with positive PDL 1 expression 50% diagnosed in March 2018 and presented with right upper lobe lung nodule in addition to mediastinal lymphadenopathy.  PLAN: I had a lengthy discussion with the patient and his family today about his current disease stage, prognosis and treatment options. I recommended for the patient to complete the staging workup by ordering a PET scan to rule out any metastatic disease. I discussed with the patient the treatment options and goals of care. If no evidence of metastatic disease, he probably would have curative option with concurrent chemoradiation. I recommended  for the patient a course of concurrent chemoradiation with weekly carboplatin for AUC of 2 and paclitaxel 45 MG/M2. I discussed with the patient adverse effect of the chemotherapy including but not limited to alopecia, myelosuppression, nausea and vomiting, peripheral neuropathy, liver or renal dysfunction. This could be followed by consolidation immunotherapy is no evidence for disease progression after the concurrent chemoradiation. I will arrange for the patient to have a chemotherapy education class before starting the first dose of his chemotherapy. I will refer the patient to radiation oncology for evaluation and discussion of the radiotherapy option. I will call his pharmacy with prescription for Compazine 10 mg by mouth every 6 hours as needed for nausea. For the malnutrition, I will refer the patient to the dietitian at the [facility] for evaluation. I will see him back for follow-up visit in 2 weeks for evaluation and management any adverse effect of his treatment. He was advised to call  immediately if he has any concerning symptoms in the interval. The patient voices understanding of current disease status and treatment options and is in agreement with the current care plan.  All questions were answered. The patient knows to call the clinic with any problems, questions or concerns. We can certainly see the patient much sooner if necessary.  Thank you so much for allowing me to participate in the care of [name] [name]. I will continue to follow up the patient with you and assist in his care.  I spent 55 minutes counseling the patient face to face. The total time spent in the appointment was 80 minutes.  Disclaimer: This note was dictated with voice recognition software. Similar sounding words can inadvertently be transcribed and may not be corrected upon review.   Marija Calamari K. September 20, 2016, 4:17 PM

## 2016-09-20 NOTE — Progress Notes (Signed)
START ON PATHWAY REGIMEN - Non-Small Cell Lung     Administer weekly:     Paclitaxel      Carboplatin   **Always confirm dose/schedule in your pharmacy ordering system**  Patient Characteristics: Stage III - Unresectable, PS = 0, 1 AJCC T Category: T1c Current Disease Status: No Distant Mets or Local Recurrence AJCC N Category: N2 AJCC M Category: M0 AJCC 8 Stage Grouping: IIIA Performance Status: PS = 0, 1 Intent of Therapy: Curative Intent, Discussed with Patient 

## 2016-09-21 ENCOUNTER — Telehealth: Payer: Self-pay | Admitting: Oncology

## 2016-09-21 NOTE — Telephone Encounter (Signed)
Wife called to let us know outside appts was scheduled so that we are able to schedule lad and f/u on 4/16, patient is aware of new appt time.

## 2016-09-22 ENCOUNTER — Encounter: Payer: Self-pay | Admitting: *Deleted

## 2016-09-22 ENCOUNTER — Telehealth: Payer: Self-pay | Admitting: Family Medicine

## 2016-09-22 DIAGNOSIS — S329XXD Fracture of unspecified parts of lumbosacral spine and pelvis, subsequent encounter for fracture with routine healing: Secondary | ICD-10-CM | POA: Diagnosis not present

## 2016-09-22 DIAGNOSIS — I4891 Unspecified atrial fibrillation: Secondary | ICD-10-CM | POA: Diagnosis not present

## 2016-09-22 DIAGNOSIS — N390 Urinary tract infection, site not specified: Secondary | ICD-10-CM | POA: Diagnosis not present

## 2016-09-22 DIAGNOSIS — I739 Peripheral vascular disease, unspecified: Secondary | ICD-10-CM | POA: Diagnosis not present

## 2016-09-22 DIAGNOSIS — I82409 Acute embolism and thrombosis of unspecified deep veins of unspecified lower extremity: Secondary | ICD-10-CM | POA: Diagnosis not present

## 2016-09-22 DIAGNOSIS — C8521 Mediastinal (thymic) large B-cell lymphoma, lymph nodes of head, face, and neck: Secondary | ICD-10-CM | POA: Diagnosis not present

## 2016-09-22 NOTE — Telephone Encounter (Signed)
Knock down to 7.5 all days but Tuesday stay on ten mg tue, let h h nurse makes me a bit nervous that two different strengths of coumadin called in for patient,counsel caution with the patient, normally I would rec only one strength (I.e. Use five mg tab only, and vary numb based on daily dosage)rep one wk

## 2016-09-22 NOTE — Telephone Encounter (Signed)
[facility] called to report patient's INR results. Pt INR reading was 3.3. Patient's dosing schedule - 7.5 mg Monday, Wednesday, Friday and Sunday and 10 mg on Tuesday, Thursday and Saturday.

## 2016-09-22 NOTE — Telephone Encounter (Signed)
Discussed with home health nurse Verdis Frederickson. She verbalized understanding.

## 2016-09-23 DIAGNOSIS — N390 Urinary tract infection, site not specified: Secondary | ICD-10-CM | POA: Diagnosis not present

## 2016-09-23 DIAGNOSIS — I739 Peripheral vascular disease, unspecified: Secondary | ICD-10-CM | POA: Diagnosis not present

## 2016-09-23 DIAGNOSIS — I4891 Unspecified atrial fibrillation: Secondary | ICD-10-CM | POA: Diagnosis not present

## 2016-09-23 DIAGNOSIS — S329XXD Fracture of unspecified parts of lumbosacral spine and pelvis, subsequent encounter for fracture with routine healing: Secondary | ICD-10-CM | POA: Diagnosis not present

## 2016-09-23 DIAGNOSIS — I82409 Acute embolism and thrombosis of unspecified deep veins of unspecified lower extremity: Secondary | ICD-10-CM | POA: Diagnosis not present

## 2016-09-23 DIAGNOSIS — C8521 Mediastinal (thymic) large B-cell lymphoma, lymph nodes of head, face, and neck: Secondary | ICD-10-CM | POA: Diagnosis not present

## 2016-09-26 DIAGNOSIS — N390 Urinary tract infection, site not specified: Secondary | ICD-10-CM | POA: Diagnosis not present

## 2016-09-26 DIAGNOSIS — I82409 Acute embolism and thrombosis of unspecified deep veins of unspecified lower extremity: Secondary | ICD-10-CM | POA: Diagnosis not present

## 2016-09-26 DIAGNOSIS — I739 Peripheral vascular disease, unspecified: Secondary | ICD-10-CM | POA: Diagnosis not present

## 2016-09-26 DIAGNOSIS — I4891 Unspecified atrial fibrillation: Secondary | ICD-10-CM | POA: Diagnosis not present

## 2016-09-26 DIAGNOSIS — S329XXD Fracture of unspecified parts of lumbosacral spine and pelvis, subsequent encounter for fracture with routine healing: Secondary | ICD-10-CM | POA: Diagnosis not present

## 2016-09-26 DIAGNOSIS — C8521 Mediastinal (thymic) large B-cell lymphoma, lymph nodes of head, face, and neck: Secondary | ICD-10-CM | POA: Diagnosis not present

## 2016-09-27 ENCOUNTER — Encounter: Payer: Self-pay | Admitting: Urology

## 2016-09-27 ENCOUNTER — Telehealth: Payer: Self-pay | Admitting: Family Medicine

## 2016-09-27 DIAGNOSIS — I82409 Acute embolism and thrombosis of unspecified deep veins of unspecified lower extremity: Secondary | ICD-10-CM | POA: Diagnosis not present

## 2016-09-27 DIAGNOSIS — I4891 Unspecified atrial fibrillation: Secondary | ICD-10-CM | POA: Diagnosis not present

## 2016-09-27 DIAGNOSIS — C8521 Mediastinal (thymic) large B-cell lymphoma, lymph nodes of head, face, and neck: Secondary | ICD-10-CM | POA: Diagnosis not present

## 2016-09-27 DIAGNOSIS — N390 Urinary tract infection, site not specified: Secondary | ICD-10-CM | POA: Diagnosis not present

## 2016-09-27 DIAGNOSIS — I739 Peripheral vascular disease, unspecified: Secondary | ICD-10-CM | POA: Diagnosis not present

## 2016-09-27 DIAGNOSIS — S329XXD Fracture of unspecified parts of lumbosacral spine and pelvis, subsequent encounter for fracture with routine healing: Secondary | ICD-10-CM | POA: Diagnosis not present

## 2016-09-27 NOTE — Progress Notes (Signed)
Patient is scheduled for PET scan on 09/29/16 and re-consult with Dr. Tammi Klippel on 10/05/16.  Had consult with Mohamed on 09/20/16 and is scheduled to begin systemic chemotherapy for Small Cell Lung cancer.

## 2016-09-27 NOTE — Telephone Encounter (Signed)
On 09/22/16 advised Knock down to 7.5 all days but Tuesday stay on 10 mg tue

## 2016-09-27 NOTE — Telephone Encounter (Signed)
Alternate five (start with five today)with 7.5 for one wk then repeat

## 2016-09-27 NOTE — Telephone Encounter (Signed)
[place] called to give results of PT-37.2 and INR 3.1. If you to speak with her [phone]

## 2016-09-27 NOTE — Telephone Encounter (Signed)
Spoke with home health nurse Verdis Frederickson, Patient and Patient's wife(per patient request) and advised Dr Richardson Landry recommends to alternate '5mg'$ (start today with '5mg'$ ) with 7.5 mg for one week and repeat INR via home health in one week-call results to Dr Richardson Landry.  Home health nurse, patient and patient's wife all verbalized understanding.

## 2016-09-28 ENCOUNTER — Telehealth: Payer: Self-pay | Admitting: Medical Oncology

## 2016-09-28 ENCOUNTER — Other Ambulatory Visit: Payer: Medicare Other

## 2016-09-28 ENCOUNTER — Other Ambulatory Visit: Payer: Self-pay | Admitting: Medical Oncology

## 2016-09-28 ENCOUNTER — Encounter: Payer: Self-pay | Admitting: *Deleted

## 2016-09-28 DIAGNOSIS — I739 Peripheral vascular disease, unspecified: Secondary | ICD-10-CM | POA: Diagnosis not present

## 2016-09-28 DIAGNOSIS — C3491 Malignant neoplasm of unspecified part of right bronchus or lung: Secondary | ICD-10-CM

## 2016-09-28 DIAGNOSIS — S329XXD Fracture of unspecified parts of lumbosacral spine and pelvis, subsequent encounter for fracture with routine healing: Secondary | ICD-10-CM | POA: Diagnosis not present

## 2016-09-28 DIAGNOSIS — C8521 Mediastinal (thymic) large B-cell lymphoma, lymph nodes of head, face, and neck: Secondary | ICD-10-CM | POA: Diagnosis not present

## 2016-09-28 DIAGNOSIS — I82409 Acute embolism and thrombosis of unspecified deep veins of unspecified lower extremity: Secondary | ICD-10-CM | POA: Diagnosis not present

## 2016-09-28 DIAGNOSIS — N390 Urinary tract infection, site not specified: Secondary | ICD-10-CM | POA: Diagnosis not present

## 2016-09-28 DIAGNOSIS — I4891 Unspecified atrial fibrillation: Secondary | ICD-10-CM | POA: Diagnosis not present

## 2016-09-28 MED ORDER — PROCHLORPERAZINE MALEATE 10 MG PO TABS: 10.0000 mg | ORAL_TABLET | Freq: Four times a day (QID) | ORAL | 0 refills | Status: DC | PRN

## 2016-09-28 NOTE — Telephone Encounter (Signed)
Interaction with compazine and kdur -Compazine slows GI motility and kdur can be in stomach longer and potentially cause problems. Does [name] [name] want pt to have anything else for prn nausea?

## 2016-09-28 NOTE — Telephone Encounter (Signed)
Ok to change to Zofran

## 2016-09-29 ENCOUNTER — Telehealth: Payer: Self-pay

## 2016-09-29 ENCOUNTER — Telehealth: Payer: Self-pay | Admitting: *Deleted

## 2016-09-29 ENCOUNTER — Other Ambulatory Visit: Payer: Self-pay | Admitting: Medical Oncology

## 2016-09-29 ENCOUNTER — Encounter (HOSPITAL_COMMUNITY): Payer: Self-pay

## 2016-09-29 ENCOUNTER — Encounter (HOSPITAL_COMMUNITY)
Admission: RE | Admit: 2016-09-29 | Discharge: 2016-09-29 | Disposition: A | Discharge status: Home / Self Care | Payer: Medicare Other | Source: Ambulatory Visit | Attending: Internal Medicine | Admitting: Internal Medicine

## 2016-09-29 ENCOUNTER — Other Ambulatory Visit: Payer: Self-pay | Admitting: Internal Medicine

## 2016-09-29 ENCOUNTER — Ambulatory Visit: Payer: Medicare Other | Admitting: Family Medicine

## 2016-09-29 ENCOUNTER — Encounter: Payer: Self-pay | Admitting: Physical Medicine & Rehabilitation

## 2016-09-29 ENCOUNTER — Encounter: Payer: Medicare Other | Attending: Physical Medicine & Rehabilitation | Admitting: Physical Medicine & Rehabilitation

## 2016-09-29 VITALS — BP 154/72 | HR 85 | Resp 14

## 2016-09-29 DIAGNOSIS — Z951 Presence of aortocoronary bypass graft: Secondary | ICD-10-CM | POA: Insufficient documentation

## 2016-09-29 DIAGNOSIS — E039 Hypothyroidism, unspecified: Secondary | ICD-10-CM | POA: Insufficient documentation

## 2016-09-29 DIAGNOSIS — Z87440 Personal history of urinary (tract) infections: Secondary | ICD-10-CM | POA: Diagnosis not present

## 2016-09-29 DIAGNOSIS — Z9889 Other specified postprocedural states: Secondary | ICD-10-CM | POA: Insufficient documentation

## 2016-09-29 DIAGNOSIS — C3491 Malignant neoplasm of unspecified part of right bronchus or lung: Secondary | ICD-10-CM | POA: Insufficient documentation

## 2016-09-29 DIAGNOSIS — M1711 Unilateral primary osteoarthritis, right knee: Secondary | ICD-10-CM | POA: Diagnosis not present

## 2016-09-29 DIAGNOSIS — I25810 Atherosclerosis of coronary artery bypass graft(s) without angina pectoris: Secondary | ICD-10-CM | POA: Insufficient documentation

## 2016-09-29 DIAGNOSIS — Z87891 Personal history of nicotine dependence: Secondary | ICD-10-CM | POA: Insufficient documentation

## 2016-09-29 DIAGNOSIS — S32810S Multiple fractures of pelvis with stable disruption of pelvic ring, sequela: Secondary | ICD-10-CM

## 2016-09-29 DIAGNOSIS — Z96653 Presence of artificial knee joint, bilateral: Secondary | ICD-10-CM | POA: Diagnosis not present

## 2016-09-29 DIAGNOSIS — I10 Essential (primary) hypertension: Secondary | ICD-10-CM | POA: Diagnosis not present

## 2016-09-29 DIAGNOSIS — I129 Hypertensive chronic kidney disease with stage 1 through stage 4 chronic kidney disease, or unspecified chronic kidney disease: Secondary | ICD-10-CM | POA: Diagnosis not present

## 2016-09-29 DIAGNOSIS — E7800 Pure hypercholesterolemia, unspecified: Secondary | ICD-10-CM | POA: Insufficient documentation

## 2016-09-29 DIAGNOSIS — I739 Peripheral vascular disease, unspecified: Secondary | ICD-10-CM | POA: Insufficient documentation

## 2016-09-29 DIAGNOSIS — I4891 Unspecified atrial fibrillation: Secondary | ICD-10-CM | POA: Diagnosis not present

## 2016-09-29 DIAGNOSIS — K219 Gastro-esophageal reflux disease without esophagitis: Secondary | ICD-10-CM | POA: Insufficient documentation

## 2016-09-29 DIAGNOSIS — I252 Old myocardial infarction: Secondary | ICD-10-CM | POA: Insufficient documentation

## 2016-09-29 DIAGNOSIS — Z8249 Family history of ischemic heart disease and other diseases of the circulatory system: Secondary | ICD-10-CM | POA: Insufficient documentation

## 2016-09-29 DIAGNOSIS — Z833 Family history of diabetes mellitus: Secondary | ICD-10-CM | POA: Insufficient documentation

## 2016-09-29 DIAGNOSIS — R51 Headache: Secondary | ICD-10-CM | POA: Diagnosis not present

## 2016-09-29 DIAGNOSIS — Z801 Family history of malignant neoplasm of trachea, bronchus and lung: Secondary | ICD-10-CM | POA: Insufficient documentation

## 2016-09-29 DIAGNOSIS — Z9852 Vasectomy status: Secondary | ICD-10-CM | POA: Diagnosis not present

## 2016-09-29 DIAGNOSIS — Z955 Presence of coronary angioplasty implant and graft: Secondary | ICD-10-CM | POA: Insufficient documentation

## 2016-09-29 DIAGNOSIS — Z8679 Personal history of other diseases of the circulatory system: Secondary | ICD-10-CM | POA: Insufficient documentation

## 2016-09-29 DIAGNOSIS — I82433 Acute embolism and thrombosis of popliteal vein, bilateral: Secondary | ICD-10-CM | POA: Diagnosis not present

## 2016-09-29 DIAGNOSIS — N189 Chronic kidney disease, unspecified: Secondary | ICD-10-CM | POA: Diagnosis not present

## 2016-09-29 DIAGNOSIS — J449 Chronic obstructive pulmonary disease, unspecified: Secondary | ICD-10-CM | POA: Insufficient documentation

## 2016-09-29 DIAGNOSIS — Z87442 Personal history of urinary calculi: Secondary | ICD-10-CM | POA: Insufficient documentation

## 2016-09-29 DIAGNOSIS — R339 Retention of urine, unspecified: Secondary | ICD-10-CM | POA: Diagnosis not present

## 2016-09-29 DIAGNOSIS — R5381 Other malaise: Secondary | ICD-10-CM | POA: Insufficient documentation

## 2016-09-29 DIAGNOSIS — Z9049 Acquired absence of other specified parts of digestive tract: Secondary | ICD-10-CM | POA: Diagnosis not present

## 2016-09-29 LAB — GLUCOSE, CAPILLARY: GLUCOSE-CAPILLARY: 139 mg/dL — AB (ref 65–99)

## 2016-09-29 MED ORDER — FLUDEOXYGLUCOSE F - 18 (FDG) INJECTION
7.0300 | Freq: Once | INTRAVENOUS | Status: AC | PRN
  Administered 2016-09-29: 7.03 via INTRAVENOUS

## 2016-09-29 MED ORDER — TAMSULOSIN HCL 0.4 MG PO CAPS: 0.4000 mg | ORAL_CAPSULE | Freq: Every day | ORAL | 1 refills | Status: DC

## 2016-09-29 MED ORDER — ONDANSETRON HCL 8 MG PO TABS: 8.0000 mg | ORAL_TABLET | Freq: Three times a day (TID) | ORAL | 0 refills | Status: DC | PRN

## 2016-09-29 NOTE — Telephone Encounter (Signed)
Nurse from [facility] has called an stated she just needed a verbal order to remove [name] [name] foley. Verbal order has been given

## 2016-09-29 NOTE — Telephone Encounter (Signed)
[name] patient son states [facility] needs an order to remove [name] foley on tomorrow.  Please advise

## 2016-09-29 NOTE — Telephone Encounter (Signed)
Per Dr Posey Pronto he needs foley removed and training for I&O cath if necessary. I tried to reach [facility] multiple times but got her VM and it says not to leave orders on the line.  I spoke to someone who said she stepped out for lunch and referred me back to VM. I left message for her to call back.

## 2016-09-29 NOTE — Telephone Encounter (Signed)
Oncology Nurse Navigator Documentation  Oncology Nurse Navigator Flowsheets 09/29/2016  Navigator Location [facility]  Navigator Encounter Type Telephone/I updated Dr. Julien Nordmann on [name] [name] schedule.  He states chemo needs to be changed and he would like me to call patient. I called but was unable to reach.  I did leave a vm message with my name and phone number to call with an update on his schedule.   Telephone Outgoing Call  Treatment Phase Pre-Tx/Tx Discussion  Barriers/Navigation Needs Coordination of Care  Interventions Coordination of Care  Coordination of Care Appts  Acuity Level 2  Time Spent with Patient 60

## 2016-09-29 NOTE — Progress Notes (Signed)
Subjective:    Patient ID: [name] [name], male    DOB: Dec 06, 1940, 76 y.o.   MRN: [id number]  HPI 76 year old male with history of peripheral vascular disease, left carotid surgery, CAD with CABG, atrial fibrillation presents for transitional care management after receiving CIR for  Pelvic fracture with hematoma.   DATE OF ADMISSION:  08/31/2016 DATE OF DISCHARGE:  09/16/2016  Presents with son and wife, who provide majority of history. At discharge, he was instructed to maintain NWB LLE, which he has been doing.  He is still on coumadin.  He was on Cipro for a UTI and completed the course. Denies pain. His BP is high, but wife notes it is normally controlled. He goes for PET scan today and Chemo/Radiation Monday.  His scrotal edema has decreased.  Overall, pt states he is doing well.    Therapies: Doing well, on hold until cleared to WB Mobility: wheelchair at all times DME: Independently purchased/already had  Pain Inventory Average Pain 0 Pain Right Now 0 My pain is no pain  In the last 24 hours, has pain interfered with the following? General activity 0 Relation with others 0 Enjoyment of life 0 What TIME of day is your pain at its worst? no pain Sleep (in general) Good  Pain is worse with: no pain Pain improves with: no pain Relief from Meds: no pain  Mobility use a wheelchair needs help with transfers  Function retired  Neuro/Psych bladder control problems  Prior Studies x-rays hospital follow up  Physicians involved in your care hospital follow up   Family History  Problem Relation Age of Onset  . Hypertension Mother   . Heart disease Father   . Heart attack Father   . Cancer Brother     Lung  . Diabetes Sister    Social History   Social History  . Marital status: Married    Spouse name: [name]  . Number of children: [name]  . Years of education: [name]   Social History Main Topics  . Smoking status: Former Smoker    Types: Cigarettes    Quit  date: 06/20/1992  . Smokeless tobacco: Never Used  . Alcohol use No  . Drug use: No  . Sexual activity: Not Asked   Other Topics Concern  . None   Social History Narrative  . None   Past Surgical History:  Procedure Laterality Date  . ABDOMINAL AORTIC ANEURYSM REPAIR     with reimplantation of renals   . CARDIAC CATHETERIZATION     2008  . CAROTID ENDARTERECTOMY Left Jan. 30, 2015   CEA  . CHOLECYSTECTOMY  2000   Gall Bladder  . COLONOSCOPY W/ BIOPSIES AND POLYPECTOMY     Hx: of  . CORONARY ARTERY BYPASS GRAFT     1995  . CORONARY STENT PLACEMENT     Hx: of  . ENDARTERECTOMY Left 07/19/2013   Procedure: ENDARTERECTOMY CAROTID;  Surgeon: Mal Misty, MD;  Location: [facility];  Service: Vascular;  Laterality: Left;  . GALLBLADDER SURGERY  2004   . JOINT REPLACEMENT     partial knee replacement on left 2002   . KNEE SURGERY    . ORIF PELVIC FRACTURE Left 08/25/2016   Procedure: OPEN REDUCTION INTERNAL FIXATION (ORIF) PELVIC FRACTURE; plate on front, SI screw on the back;  Surgeon: Altamese Atkinson, MD;  Location: [facility];  Service: Orthopedics;  Laterality: Left;  . OTHER SURGICAL HISTORY     left subclavian stenosis surgery PTA  08/2006   . OTHER SURGICAL HISTORY     carotid surgery on right 2004   . TOTAL KNEE ARTHROPLASTY  04/29/2011   Procedure: TOTAL KNEE ARTHROPLASTY;  Surgeon: Gearlean Alf;  Location: [facility];  Service: Orthopedics;  Laterality: Right;  [name] VASCULAR SURGERY     AAA  . VASECTOMY  1973  . VIDEO BRONCHOSCOPY WITH ENDOBRONCHIAL ULTRASOUND [name] 09/09/2016   Procedure: VIDEO BRONCHOSCOPY WITH ENDOBRONCHIAL ULTRASOUND;  Surgeon: Grace Isaac, MD;  Location: [facility];  Service: Thoracic;  Laterality: [name];   Past Medical History:  Diagnosis Date  . Adenocarcinoma of right lung, stage 3 ([place]) 09/20/2016  . Atrial fibrillation ([facility])    no hx of reported at preop visit of 04/21/11   . CAD (coronary artery disease)   . Carotid artery occlusion    left carotid  endarterectomy  . Chronic kidney disease    AAA repair with reimplant of renals   . CHRONIC OBSTRUCTIVE PULMONARY DISEASE    pt denied at visit of 04/21/11   . CORONARY ARTERY DISEASE   . Disorder of left sacroiliac joint 08/24/2016  . DIVERTICULOSIS OF COLON   . GERD   . H/O hiatal hernia   . Headache(784.0)    Hx: of years of years  . HYPERTENSION   . HYPOTHYROIDISM   . JOINT EFFUSION, KNEE   . KNEE, ARTHRITIS, DEGEN./OSTEO    right knee   . Myocardial infarction    1994   . NEPHROLITHIASIS, HX OF   . OSTEOPOROSIS   . Other dysphagia   . PERIPHERAL VASCULAR DISEASE    AAA - 1994 with reimplant of renals   . Peripheral vascular disease ([facility])    subclavian stenosis PTA - 3/08   . Pure hypercholesterolemia   . Renal artery stenosis ([facility])    BP (!) 154/72 (BP Location: Left Arm, Patient Position: Sitting, Cuff Size: Normal)   Pulse 85   Resp 14   SpO2 97%   Opioid Risk Score:   Fall Risk Score:  `1  Depression screen PHQ 2/9  Depression screen [facility] 2/9 09/29/2016 03/23/2015  Decreased Interest 0 0  Down, Depressed, Hopeless 0 1  PHQ - 2 Score 0 1  Altered sleeping 0 -  Tired, decreased energy 0 -  Change in appetite 0 -  Feeling bad or failure about yourself  0 -  Trouble concentrating 0 -  Moving slowly or fidgety/restless 0 -  PHQ-9 Score 0 -    Review of Systems  Constitutional: Negative.   HENT: Negative.   Eyes: Negative.   Respiratory: Negative.   Cardiovascular: Negative.   Gastrointestinal: Negative.   Endocrine: Negative.   Genitourinary: Negative.   Musculoskeletal: Negative.   Skin: Negative.   Allergic/Immunologic: Negative.   Neurological: Negative.   Hematological: Negative.   Psychiatric/Behavioral: Negative.   All other systems reviewed and are negative.     Objective:   Physical Exam Constitutional: He appears well-developed and well-nourished. NAD. HENT: Normocephalic and atraumatic.  Eyes: EOMI. No discharge.  Cardiovascular: RRR.  No JVD. Respiratory: normal effort. Clear GI: Soft. Bowel sounds are normal.  Musculoskeletal: He exhibits no edema. No tenderness.  GU: Foley Neurological: He is alert.  HOH Alert, oriented x3 with cues Motor: RUE 4+/5 proximal to distal LUE: 4/5 proximal to distal RLE: 4+/5 proximal to distal LLE: 4-4+/5 HF, 4-4+/5 KE, 4+/5 ADF/PF Skin: Skin is warm and dry.  Psychiatric: His mood appears pleasant and appropriate    Assessment & Plan:  76 year old male with history of peripheral vascular disease, left carotid surgery, CAD with CABG, atrial fibrillation presents for transitional care management after receiving CIR for  Pelvic fracture with hematoma  1. Decreased functional mobility secondary to Closed pelvic ring fracture with disorder of left sacroiliac joint with retroperitoneal hemorrhage in the left side of the pelvis with subcutaneous hemorrhage in the left buttocks after fall status post ORIF with sacroiliac screw fixation of left SI joint 08/25/2016.   Weightbearing as tolerated right lower extremity for transfers only   Nonweightbearing left lower extremity  Follow up with Ortho - pt has not made appointment  Cont therapies once able to WB  2. Acute DVT  Cont Warfarin  3. Hypertension.   Elevated in office, however, WNL at home per wife  Follow up with PCP  4. CAD status post CABG/Afib/PVD  Cont meds  5. Nonsmall cell adenocarcinoma  Cont follow up Heme/Onc, Rad/Onc  6. Scrotal edema with urinary retention  D/c foley  Resume flomax   7. Gait abnormality  Cont wheelchair until cleared to WB  Resume therapies when appropriate   Meds reviewed  Referrals reviewed All questions answered

## 2016-09-29 NOTE — Telephone Encounter (Signed)
Oncology Nurse Navigator Documentation  Oncology Nurse Navigator Flowsheets 09/29/2016  Navigator Location [facility]  Navigator Encounter Type Telephone/Ms. Bowditch called me back and left vm message. I called her and updated her on the appt for 1st chemo on 4/30 due to XRT.  She verbalized understanding of appt   Telephone Outgoing Call;Incoming Call  Treatment Phase Pre-Tx/Tx Discussion  Barriers/Navigation Needs Education  Education Other  Interventions Education  Education Method Verbal  Acuity Level 1  Time Spent with Patient 15

## 2016-09-29 NOTE — Telephone Encounter (Signed)
It is in my notes.  If they need an order, how would they like it?

## 2016-09-30 ENCOUNTER — Telehealth: Payer: Self-pay | Admitting: Internal Medicine

## 2016-09-30 DIAGNOSIS — S329XXD Fracture of unspecified parts of lumbosacral spine and pelvis, subsequent encounter for fracture with routine healing: Secondary | ICD-10-CM | POA: Diagnosis not present

## 2016-09-30 DIAGNOSIS — I4891 Unspecified atrial fibrillation: Secondary | ICD-10-CM | POA: Diagnosis not present

## 2016-09-30 DIAGNOSIS — C8521 Mediastinal (thymic) large B-cell lymphoma, lymph nodes of head, face, and neck: Secondary | ICD-10-CM | POA: Diagnosis not present

## 2016-09-30 DIAGNOSIS — N390 Urinary tract infection, site not specified: Secondary | ICD-10-CM | POA: Diagnosis not present

## 2016-09-30 DIAGNOSIS — I82409 Acute embolism and thrombosis of unspecified deep veins of unspecified lower extremity: Secondary | ICD-10-CM | POA: Diagnosis not present

## 2016-09-30 DIAGNOSIS — I739 Peripheral vascular disease, unspecified: Secondary | ICD-10-CM | POA: Diagnosis not present

## 2016-09-30 NOTE — Telephone Encounter (Signed)
r/s appts per sch message from [place] - Patient awre of appt date and time.

## 2016-10-01 DIAGNOSIS — I739 Peripheral vascular disease, unspecified: Secondary | ICD-10-CM | POA: Diagnosis not present

## 2016-10-01 DIAGNOSIS — N390 Urinary tract infection, site not specified: Secondary | ICD-10-CM | POA: Diagnosis not present

## 2016-10-01 DIAGNOSIS — C8521 Mediastinal (thymic) large B-cell lymphoma, lymph nodes of head, face, and neck: Secondary | ICD-10-CM | POA: Diagnosis not present

## 2016-10-01 DIAGNOSIS — S329XXD Fracture of unspecified parts of lumbosacral spine and pelvis, subsequent encounter for fracture with routine healing: Secondary | ICD-10-CM | POA: Diagnosis not present

## 2016-10-01 DIAGNOSIS — I82409 Acute embolism and thrombosis of unspecified deep veins of unspecified lower extremity: Secondary | ICD-10-CM | POA: Diagnosis not present

## 2016-10-01 DIAGNOSIS — I4891 Unspecified atrial fibrillation: Secondary | ICD-10-CM | POA: Diagnosis not present

## 2016-10-03 ENCOUNTER — Telehealth: Payer: Self-pay | Admitting: Internal Medicine

## 2016-10-03 ENCOUNTER — Ambulatory Visit: Payer: Medicare Other | Admitting: Oncology

## 2016-10-03 ENCOUNTER — Encounter (HOSPITAL_COMMUNITY): Payer: Self-pay

## 2016-10-03 ENCOUNTER — Ambulatory Visit: Payer: Medicare Other

## 2016-10-03 ENCOUNTER — Other Ambulatory Visit: Payer: Medicare Other

## 2016-10-03 ENCOUNTER — Telehealth: Payer: Self-pay | Admitting: Family Medicine

## 2016-10-03 ENCOUNTER — Emergency Department (HOSPITAL_COMMUNITY): Payer: Medicare Other

## 2016-10-03 ENCOUNTER — Inpatient Hospital Stay (HOSPITAL_COMMUNITY)
Admission: EM | Admit: 2016-10-03 | Discharge: 2016-10-07 | DRG: 699 | Disposition: A | Discharge status: Home / Self Care | Payer: Medicare Other | Attending: Internal Medicine | Admitting: Internal Medicine

## 2016-10-03 ENCOUNTER — Ambulatory Visit: Payer: Medicare Other | Admitting: Family Medicine

## 2016-10-03 DIAGNOSIS — M810 Age-related osteoporosis without current pathological fracture: Secondary | ICD-10-CM | POA: Diagnosis present

## 2016-10-03 DIAGNOSIS — E785 Hyperlipidemia, unspecified: Secondary | ICD-10-CM | POA: Diagnosis present

## 2016-10-03 DIAGNOSIS — E222 Syndrome of inappropriate secretion of antidiuretic hormone: Secondary | ICD-10-CM | POA: Diagnosis present

## 2016-10-03 DIAGNOSIS — E440 Moderate protein-calorie malnutrition: Secondary | ICD-10-CM | POA: Diagnosis present

## 2016-10-03 DIAGNOSIS — Z96653 Presence of artificial knee joint, bilateral: Secondary | ICD-10-CM | POA: Diagnosis present

## 2016-10-03 DIAGNOSIS — C342 Malignant neoplasm of middle lobe, bronchus or lung: Secondary | ICD-10-CM | POA: Diagnosis present

## 2016-10-03 DIAGNOSIS — T83511A Infection and inflammatory reaction due to indwelling urethral catheter, initial encounter: Principal | ICD-10-CM | POA: Diagnosis present

## 2016-10-03 DIAGNOSIS — R319 Hematuria, unspecified: Secondary | ICD-10-CM | POA: Diagnosis not present

## 2016-10-03 DIAGNOSIS — E871 Hypo-osmolality and hyponatremia: Secondary | ICD-10-CM | POA: Diagnosis present

## 2016-10-03 DIAGNOSIS — I2510 Atherosclerotic heart disease of native coronary artery without angina pectoris: Secondary | ICD-10-CM | POA: Diagnosis present

## 2016-10-03 DIAGNOSIS — Z833 Family history of diabetes mellitus: Secondary | ICD-10-CM

## 2016-10-03 DIAGNOSIS — I10 Essential (primary) hypertension: Secondary | ICD-10-CM | POA: Diagnosis not present

## 2016-10-03 DIAGNOSIS — Z801 Family history of malignant neoplasm of trachea, bronchus and lung: Secondary | ICD-10-CM

## 2016-10-03 DIAGNOSIS — R531 Weakness: Secondary | ICD-10-CM

## 2016-10-03 DIAGNOSIS — Z951 Presence of aortocoronary bypass graft: Secondary | ICD-10-CM

## 2016-10-03 DIAGNOSIS — C7931 Secondary malignant neoplasm of brain: Secondary | ICD-10-CM | POA: Diagnosis present

## 2016-10-03 DIAGNOSIS — Z9049 Acquired absence of other specified parts of digestive tract: Secondary | ICD-10-CM | POA: Diagnosis not present

## 2016-10-03 DIAGNOSIS — K219 Gastro-esophageal reflux disease without esophagitis: Secondary | ICD-10-CM | POA: Diagnosis present

## 2016-10-03 DIAGNOSIS — R509 Fever, unspecified: Secondary | ICD-10-CM

## 2016-10-03 DIAGNOSIS — I350 Nonrheumatic aortic (valve) stenosis: Secondary | ICD-10-CM | POA: Diagnosis present

## 2016-10-03 DIAGNOSIS — S329XXD Fracture of unspecified parts of lumbosacral spine and pelvis, subsequent encounter for fracture with routine healing: Secondary | ICD-10-CM | POA: Diagnosis not present

## 2016-10-03 DIAGNOSIS — N3000 Acute cystitis without hematuria: Secondary | ICD-10-CM

## 2016-10-03 DIAGNOSIS — Z452 Encounter for adjustment and management of vascular access device: Secondary | ICD-10-CM | POA: Diagnosis not present

## 2016-10-03 DIAGNOSIS — E7800 Pure hypercholesterolemia, unspecified: Secondary | ICD-10-CM | POA: Diagnosis present

## 2016-10-03 DIAGNOSIS — Z888 Allergy status to other drugs, medicaments and biological substances status: Secondary | ICD-10-CM

## 2016-10-03 DIAGNOSIS — I4891 Unspecified atrial fibrillation: Secondary | ICD-10-CM | POA: Diagnosis not present

## 2016-10-03 DIAGNOSIS — Z7901 Long term (current) use of anticoagulants: Secondary | ICD-10-CM

## 2016-10-03 DIAGNOSIS — E039 Hypothyroidism, unspecified: Secondary | ICD-10-CM | POA: Diagnosis present

## 2016-10-03 DIAGNOSIS — Z8249 Family history of ischemic heart disease and other diseases of the circulatory system: Secondary | ICD-10-CM

## 2016-10-03 DIAGNOSIS — Z86718 Personal history of other venous thrombosis and embolism: Secondary | ICD-10-CM

## 2016-10-03 DIAGNOSIS — R7301 Impaired fasting glucose: Secondary | ICD-10-CM | POA: Diagnosis present

## 2016-10-03 DIAGNOSIS — C8521 Mediastinal (thymic) large B-cell lymphoma, lymph nodes of head, face, and neck: Secondary | ICD-10-CM | POA: Diagnosis not present

## 2016-10-03 DIAGNOSIS — C3491 Malignant neoplasm of unspecified part of right bronchus or lung: Secondary | ICD-10-CM | POA: Diagnosis not present

## 2016-10-03 DIAGNOSIS — J449 Chronic obstructive pulmonary disease, unspecified: Secondary | ICD-10-CM | POA: Diagnosis present

## 2016-10-03 DIAGNOSIS — I25810 Atherosclerosis of coronary artery bypass graft(s) without angina pectoris: Secondary | ICD-10-CM | POA: Diagnosis present

## 2016-10-03 DIAGNOSIS — R404 Transient alteration of awareness: Secondary | ICD-10-CM | POA: Diagnosis not present

## 2016-10-03 DIAGNOSIS — E038 Other specified hypothyroidism: Secondary | ICD-10-CM | POA: Diagnosis not present

## 2016-10-03 DIAGNOSIS — Z87891 Personal history of nicotine dependence: Secondary | ICD-10-CM

## 2016-10-03 DIAGNOSIS — I252 Old myocardial infarction: Secondary | ICD-10-CM

## 2016-10-03 DIAGNOSIS — Z955 Presence of coronary angioplasty implant and graft: Secondary | ICD-10-CM

## 2016-10-03 DIAGNOSIS — I129 Hypertensive chronic kidney disease with stage 1 through stage 4 chronic kidney disease, or unspecified chronic kidney disease: Secondary | ICD-10-CM | POA: Diagnosis present

## 2016-10-03 DIAGNOSIS — M1990 Unspecified osteoarthritis, unspecified site: Secondary | ICD-10-CM | POA: Diagnosis present

## 2016-10-03 DIAGNOSIS — N390 Urinary tract infection, site not specified: Secondary | ICD-10-CM | POA: Diagnosis present

## 2016-10-03 DIAGNOSIS — R51 Headache: Secondary | ICD-10-CM | POA: Diagnosis not present

## 2016-10-03 DIAGNOSIS — I739 Peripheral vascular disease, unspecified: Secondary | ICD-10-CM | POA: Diagnosis present

## 2016-10-03 DIAGNOSIS — N189 Chronic kidney disease, unspecified: Secondary | ICD-10-CM | POA: Diagnosis present

## 2016-10-03 DIAGNOSIS — I82409 Acute embolism and thrombosis of unspecified deep veins of unspecified lower extremity: Secondary | ICD-10-CM | POA: Diagnosis not present

## 2016-10-03 DIAGNOSIS — Z6822 Body mass index (BMI) 22.0-22.9, adult: Secondary | ICD-10-CM

## 2016-10-03 DIAGNOSIS — E876 Hypokalemia: Secondary | ICD-10-CM | POA: Diagnosis present

## 2016-10-03 DIAGNOSIS — R338 Other retention of urine: Secondary | ICD-10-CM | POA: Diagnosis not present

## 2016-10-03 LAB — URINALYSIS, ROUTINE W REFLEX MICROSCOPIC
BILIRUBIN URINE: NEGATIVE
GLUCOSE, UA: NEGATIVE mg/dL
Ketones, ur: NEGATIVE mg/dL
LEUKOCYTES UA: NEGATIVE
NITRITE: POSITIVE — AB
PH: 5 (ref 5.0–8.0)
Protein, ur: 100 mg/dL — AB
SPECIFIC GRAVITY, URINE: 1.011 (ref 1.005–1.030)
Squamous Epithelial / LPF: NONE SEEN

## 2016-10-03 LAB — COMPREHENSIVE METABOLIC PANEL
ALK PHOS: 98 U/L (ref 38–126)
ALT: 22 U/L (ref 17–63)
ANION GAP: 13 (ref 5–15)
AST: 34 U/L (ref 15–41)
Albumin: 3.7 g/dL (ref 3.5–5.0)
BILIRUBIN TOTAL: 0.9 mg/dL (ref 0.3–1.2)
BUN: 26 mg/dL — AB (ref 6–20)
CALCIUM: 9.2 mg/dL (ref 8.9–10.3)
CO2: 28 mmol/L (ref 22–32)
Chloride: 84 mmol/L — ABNORMAL LOW (ref 101–111)
Creatinine, Ser: 1.1 mg/dL (ref 0.61–1.24)
GFR calc Af Amer: >60 mL/min (ref >60)
Glucose, Bld: 126 mg/dL — ABNORMAL HIGH (ref 65–99)
Potassium: 3.3 mmol/L — ABNORMAL LOW (ref 3.5–5.1)
Sodium: 125 mmol/L — ABNORMAL LOW (ref 135–145)
TOTAL PROTEIN: 7.6 g/dL (ref 6.5–8.1)

## 2016-10-03 LAB — CBC WITH DIFFERENTIAL/PLATELET
BASOS ABS: 0 10*3/uL (ref 0.0–0.1)
BASOS PCT: 0 %
EOS ABS: 0 10*3/uL (ref 0.0–0.7)
EOS PCT: 0 %
HCT: 38 % — ABNORMAL LOW (ref 39.0–52.0)
Hemoglobin: 13.7 g/dL (ref 13.0–17.0)
Lymphocytes Relative: 4 %
Lymphs Abs: 0.9 10*3/uL (ref 0.7–4.0)
MCH: 30.6 pg (ref 26.0–34.0)
MCHC: 36.1 g/dL — ABNORMAL HIGH (ref 30.0–36.0)
MCV: 84.8 fL (ref 78.0–100.0)
Monocytes Absolute: 0.9 10*3/uL (ref 0.1–1.0)
Monocytes Relative: 5 %
Neutro Abs: 18.8 10*3/uL — ABNORMAL HIGH (ref 1.7–7.7)
Neutrophils Relative %: 91 %
PLATELETS: 182 10*3/uL (ref 150–400)
RBC: 4.48 MIL/uL (ref 4.22–5.81)
RDW: 15 % (ref 11.5–15.5)
WBC: 20.5 10*3/uL — AB (ref 4.0–10.5)

## 2016-10-03 LAB — PHOSPHORUS: PHOSPHORUS: 3 mg/dL (ref 2.5–4.6)

## 2016-10-03 LAB — PROTIME-INR
INR: 2.69
PROTHROMBIN TIME: 29.2 s — AB (ref 11.4–15.2)

## 2016-10-03 LAB — MAGNESIUM: Magnesium: 1.7 mg/dL (ref 1.7–2.4)

## 2016-10-03 MED ORDER — SODIUM CHLORIDE 0.9 % IV SOLN: Freq: Once | INTRAVENOUS | Status: DC

## 2016-10-03 MED ORDER — LEVOFLOXACIN IN D5W 500 MG/100ML IV SOLN
500.0000 mg | INTRAVENOUS | Status: DC
  Administered 2016-10-04 – 2016-10-06 (×3): 500 mg via INTRAVENOUS
  Filled 2016-10-03 (×3): qty 100

## 2016-10-03 MED ORDER — LEVOFLOXACIN IN D5W 750 MG/150ML IV SOLN
750.0000 mg | Freq: Once | INTRAVENOUS | Status: AC
  Administered 2016-10-03: 750 mg via INTRAVENOUS
  Filled 2016-10-03: qty 150

## 2016-10-03 MED ORDER — MAGNESIUM SULFATE 2 GM/50ML IV SOLN
2.0000 g | Freq: Once | INTRAVENOUS | Status: AC
  Administered 2016-10-04: 2 g via INTRAVENOUS
  Filled 2016-10-03: qty 50

## 2016-10-03 MED ORDER — POTASSIUM CHLORIDE IN NACL 40-0.9 MEQ/L-% IV SOLN
INTRAVENOUS | Status: DC
  Administered 2016-10-03 – 2016-10-04 (×3): 75 mL/h via INTRAVENOUS
  Administered 2016-10-04 – 2016-10-06 (×4): 100 mL/h via INTRAVENOUS
  Filled 2016-10-03 (×2): qty 1000

## 2016-10-03 MED ORDER — POTASSIUM CHLORIDE IN NACL 40-0.9 MEQ/L-% IV SOLN: INTRAVENOUS | Status: DC

## 2016-10-03 MED ORDER — SODIUM CHLORIDE 0.9 % IV BOLUS (SEPSIS)
500.0000 mL | Freq: Once | INTRAVENOUS | Status: AC
  Administered 2016-10-03: 500 mL via INTRAVENOUS

## 2016-10-03 MED ORDER — LEVOTHYROXINE SODIUM 112 MCG PO TABS
112.0000 ug | ORAL_TABLET | Freq: Every day | ORAL | Status: DC
  Administered 2016-10-04 – 2016-10-07 (×4): 112 ug via ORAL
  Filled 2016-10-03 (×4): qty 1

## 2016-10-03 MED ORDER — KETOROLAC TROMETHAMINE 30 MG/ML IJ SOLN
15.0000 mg | Freq: Once | INTRAMUSCULAR | Status: AC
  Administered 2016-10-03: 15 mg via INTRAVENOUS
  Filled 2016-10-03 (×2): qty 1

## 2016-10-03 MED ORDER — WARFARIN SODIUM 5 MG PO TABS
5.0000 mg | ORAL_TABLET | Freq: Once | ORAL | Status: AC
Start: 2016-10-03 — End: 2016-10-04
  Administered 2016-10-04: 5 mg via ORAL
  Filled 2016-10-03: qty 1

## 2016-10-03 MED ORDER — SODIUM CHLORIDE 0.9 % IV BOLUS (SEPSIS): 1000.0000 mL | Freq: Once | INTRAVENOUS | Status: DC

## 2016-10-03 MED ORDER — WARFARIN - PHARMACIST DOSING INPATIENT
Freq: Every day | Status: DC
  Administered 2016-10-05 – 2016-10-06 (×2)

## 2016-10-03 NOTE — H&P (Signed)
History and Physical    [name] [name]:[id number] DOB: 1940-10-26 DOA: 10/03/2016  PCP: Mickie Hillier, MD   Patient coming from: Home.  I have personally briefly reviewed patient's old medical records in [place]  Chief Complaint: Headache and fever.  HPI: [name] [name] is a 76 y.o. male with medical history significant of right lung adenocarcinoma, chronic atrial fibrillation, coronary artery disease, chronic kidney disease, history of AAA repair, COPD, diverticulosis, GERD, hypertension, hypothyroidism, osteoarthritis, nephrolithiasis, peripheral vascular disease was coming to the emergency department with complaints of headache, fever and dark urine since this morning. He was recently admitted and discharged to [facility] and required Foley catheterization due to urinary retention. He was also started on Bumex 1 mg by mouth daily and takes Dyazide 37. 5-25 milligrams by mouth daily. The patient had a Foley catheter replaced on Saturday. He is unable to provide much history, his wife and son provided most of the information. He denies headache, chest pain, abdominal or back pain at this time. He is in no acute distress.  ED Course: The patient received 1500 mL normal saline and Levaquin 750 mg IVPB bolus in the emergency department. His urine analysis shows TNTC RBC, 6-30 WBC, positive nitrites and negative leukocyte esterase. WBC 20.5, hemoglobin 13.7 g/dL and platelets 182. His sodium 125, potassium 3.3, chloride 84 and bicarbonate 28 mmol/L. His BUN was 26, creatinine 1.1 and glucose 126 mg/dL.  Review of Systems: As per HPI otherwise 10 point review of systems negative.   Past Medical History:  Diagnosis Date  . Adenocarcinoma of right lung, stage 3 ([place]) 09/20/2016  . Atrial fibrillation ([place])    no hx of reported at preop visit of 04/21/11   . CAD (coronary artery disease)   . Carotid artery occlusion    left carotid endarterectomy  . Chronic kidney disease    AAA  repair with reimplant of renals   . CHRONIC OBSTRUCTIVE PULMONARY DISEASE    pt denied at visit of 04/21/11   . CORONARY ARTERY DISEASE   . Disorder of left sacroiliac joint 08/24/2016  . DIVERTICULOSIS OF COLON   . GERD   . H/O hiatal hernia   . Headache(784.0)    Hx: of years of years  . HYPERTENSION   . HYPOTHYROIDISM   . JOINT EFFUSION, KNEE   . KNEE, ARTHRITIS, DEGEN./OSTEO    right knee   . Myocardial infarction ([place])    1994   . NEPHROLITHIASIS, HX OF   . OSTEOPOROSIS   . Other dysphagia   . PERIPHERAL VASCULAR DISEASE    AAA - 1994 with reimplant of renals   . Peripheral vascular disease ([place])    subclavian stenosis PTA - 3/08   . Pure hypercholesterolemia   . Renal artery stenosis [facility])     Past Surgical History:  Procedure Laterality Date  . ABDOMINAL AORTIC ANEURYSM REPAIR     with reimplantation of renals   . CARDIAC CATHETERIZATION     2008  . CAROTID ENDARTERECTOMY Left Jan. 30, 2015   CEA  . CHOLECYSTECTOMY  2000   Gall Bladder  . COLONOSCOPY W/ BIOPSIES AND POLYPECTOMY     Hx: of  . CORONARY ARTERY BYPASS GRAFT     1995  . CORONARY STENT PLACEMENT     Hx: of  . ENDARTERECTOMY Left 07/19/2013   Procedure: ENDARTERECTOMY CAROTID;  Surgeon: Mal Misty, MD;  Location: [place];  Service: Vascular;  Laterality: Left;  . GALLBLADDER SURGERY  2004   . JOINT REPLACEMENT     partial knee replacement on left 2002   . KNEE SURGERY    . ORIF PELVIC FRACTURE Left 08/25/2016   Procedure: OPEN REDUCTION INTERNAL FIXATION (ORIF) PELVIC FRACTURE; plate on front, SI screw on the back;  Surgeon: Altamese Kamiah, MD;  Location: [place];  Service: Orthopedics;  Laterality: Left;  . OTHER SURGICAL HISTORY     left subclavian stenosis surgery PTA 08/2006   . OTHER SURGICAL HISTORY     carotid surgery on right 2004   . TOTAL KNEE ARTHROPLASTY  04/29/2011   Procedure: TOTAL KNEE ARTHROPLASTY;  Surgeon: Gearlean Alf;  Location: [facility];  Service: Orthopedics;  Laterality: Right;   . urinary retention    . VASCULAR SURGERY     AAA  . VASECTOMY  1973  . VIDEO BRONCHOSCOPY WITH ENDOBRONCHIAL ULTRASOUND [name] 09/09/2016   Procedure: VIDEO BRONCHOSCOPY WITH ENDOBRONCHIAL ULTRASOUND;  Surgeon: Grace Isaac, MD;  Location: [place];  Service: Thoracic;  Laterality: [name];     reports that he quit smoking about 24 years ago. His smoking use included Cigarettes. He has never used smokeless tobacco. He reports that he does not drink alcohol or use drugs.  Allergies  Allergen Reactions  . Imitrex [Sumatriptan] Nausea And Vomiting and Other (See Comments)    "made me like I was having a stoke"  . Lipitor [Atorvastatin] Nausea And Vomiting and Other (See Comments)    INTOLERANCE > MYALGIAS "couldn't get out of the bed ( pt had to crawl out of bed ) I hurt so bad"    Family History  Problem Relation Age of Onset  . Hypertension Mother   . Heart disease Father   . Heart attack Father   . Cancer Brother     Lung  . Diabetes Sister     Prior to Admission medications   Medication Sig Start Date End Date Taking? Authorizing Provider  acetaminophen (TYLENOL) 325 MG tablet Take 325-650 mg by mouth every 6 (six) hours as needed for headache (for pain).    Yes Historical Provider, MD  ALPRAZolam (XANAX) 0.25 MG tablet Take 1 tablet (0.25 mg total) by mouth 3 (three) times daily as needed for anxiety. 09/16/16  Yes Daniel J Angiulli, PA-C  bumetanide (BUMEX) 1 MG tablet Take 1 tablet (1 mg total) by mouth daily. 09/16/16  Yes Daniel J Angiulli, PA-C  gabapentin (NEURONTIN) 100 MG capsule Take 1 capsule (100 mg total) by mouth at bedtime. 09/16/16  Yes Daniel J Angiulli, PA-C  lansoprazole (PREVACID) 15 MG capsule Take 1 capsule (15 mg total) by mouth daily. 09/16/16  Yes Daniel J Angiulli, PA-C  levothyroxine (SYNTHROID, LEVOTHROID) 112 MCG tablet Take 1 tablet (112 mcg total) by mouth daily. 09/16/16  Yes Daniel J Angiulli, PA-C  linaclotide (LINZESS) 145 MCG CAPS capsule Take 1  capsule (145 mcg total) by mouth daily before breakfast. 09/17/16  Yes Daniel J Angiulli, PA-C  metoprolol (LOPRESSOR) 25 MG tablet Take 0.5 tablets (12.5 mg total) by mouth 2 (two) times daily. 09/16/16  Yes Daniel J Angiulli, PA-C  niacin (SLO-NIACIN) 500 MG tablet Take 3 tablets (1,500 mg total) by mouth at bedtime. 09/16/16  Yes Daniel J Angiulli, PA-C  nitroGLYCERIN (NITROSTAT) 0.4 MG SL tablet Place 1 tablet (0.4 mg total) under the tongue every 5 (five) minutes as needed for chest pain. 09/16/16  Yes Daniel J Angiulli, PA-C  ondansetron (ZOFRAN) 8 MG tablet Take 1 tablet (8 mg total) by  mouth every 8 (eight) hours as needed for nausea or vomiting. 09/29/16  Yes Curt Bears, MD  potassium chloride SA (K-DUR,KLOR-CON) 20 MEQ tablet Take 1 tablet (20 mEq total) by mouth daily. 09/16/16  Yes Daniel J Angiulli, PA-C  rosuvastatin (CRESTOR) 20 MG tablet Take 1 tablet (20 mg total) by mouth daily. 09/16/16  Yes Daniel J Angiulli, PA-C  tamsulosin (FLOMAX) 0.4 MG CAPS capsule Take 1 capsule (0.4 mg total) by mouth daily after breakfast. 09/29/16  Yes Ankit Lorie Phenix, MD  triamterene-hydrochlorothiazide (MAXZIDE-25) 37.5-25 MG tablet Take 1 tablet by mouth every morning. 09/16/16  Yes Daniel J Angiulli, PA-C  warfarin (COUMADIN) 5 MG tablet 1-1/2 tablet every day except 2 tablets Tuesday Thursday Saturday Patient taking differently: 1 tablet every day except 1-1/2 tablets Tuesday Thursday Saturday 09/16/16  Yes Daniel J Angiulli, PA-C  senna-docusate (SENOKOT-S) 8.6-50 MG tablet Take 2 tablets by mouth 2 (two) times daily. Patient not taking: Reported on 10/03/2016 09/16/16   Cathlyn Parsons, PA-C    Physical Exam:  Constitutional: NAD, calm, comfortable Vitals:   10/03/16 1417 10/03/16 1425 10/03/16 1442  BP:  (!) 116/48   Pulse:  88   Temp:  99 F (37.2 C)   TempSrc:  Oral   SpO2: 98% 98% 98%  Weight:  64 kg (141 lb)   Height:  '5\' 6"'$  (1.676 m)    Eyes: PERRL, lids and conjunctivae  normal ENMT: Mucous membranes are dry. Posterior pharynx clear of any exudate or lesions. Neck: normal, supple, no masses, no thyromegaly Respiratory: Decreased breath sounds on bases, otherwise clear to auscultation bilaterally, no wheezing, no crackles. Normal respiratory effort. No accessory muscle use.  Cardiovascular: Irregularly irregular, no murmurs / rubs / gallops. No extremity edema. 2+ pedal pulses. Abdomen: Soft, no tenderness, no masses palpated. No hepatosplenomegaly. Bowel sounds positive.  FY:BOFBP catheter in place.  Musculoskeletal: no clubbing / cyanosis. Good ROM, no contractures. Normal muscle tone.  Skin: no significant rashes, lesions, ulcers on limited skin exam Neurologic: CN 2-12 grossly intact. Sensation intact, DTR normal. Strength 5/5 in all 4.  Psychiatric: Alert and oriented x 2, actually oriented to situation and time.    Labs on Admission: I have personally reviewed following labs and imaging studies  CBC:  Recent Labs Lab 10/03/16 1500  WBC 20.5*  NEUTROABS 18.8*  HGB 13.7  HCT 38.0*  MCV 84.8  PLT 102   Basic Metabolic Panel:  Recent Labs Lab 10/03/16 1500  NA 125*  K 3.3*  CL 84*  CO2 28  GLUCOSE 126*  BUN 26*  CREATININE 1.10  CALCIUM 9.2   GFR: Estimated Creatinine Clearance: 49.9 mL/min (by C-G formula based on SCr of 1.1 mg/dL). Liver Function Tests:  Recent Labs Lab 10/03/16 1500  AST 34  ALT 22  ALKPHOS 98  BILITOT 0.9  PROT 7.6  ALBUMIN 3.7   No results for input(s): LIPASE, AMYLASE in the last 168 hours. No results for input(s): AMMONIA in the last 168 hours. Coagulation Profile: No results for input(s): INR, PROTIME in the last 168 hours. Cardiac Enzymes: No results for input(s): CKTOTAL, CKMB, CKMBINDEX, TROPONINI in the last 168 hours. BNP (last 3 results) No results for input(s): PROBNP in the last 8760 hours. HbA1C: No results for input(s): HGBA1C in the last 72 hours. CBG:  Recent Labs Lab  09/29/16 1144  GLUCAP 139*   Lipid Profile: No results for input(s): CHOL, HDL, LDLCALC, TRIG, CHOLHDL, LDLDIRECT in the last 72 hours. Thyroid Function Tests:  No results for input(s): TSH, T4TOTAL, FREET4, T3FREE, THYROIDAB in the last 72 hours. Anemia Panel: No results for input(s): VITAMINB12, FOLATE, FERRITIN, TIBC, IRON, RETICCTPCT in the last 72 hours. Urine analysis:    Component Value Date/Time   COLORURINE YELLOW 10/03/2016 1515   APPEARANCEUR CLEAR 10/03/2016 1515   LABSPEC 1.011 10/03/2016 1515   PHURINE 5.0 10/03/2016 1515   GLUCOSEU NEGATIVE 10/03/2016 1515   HGBUR LARGE (A) 10/03/2016 1515   BILIRUBINUR NEGATIVE 10/03/2016 1515   KETONESUR NEGATIVE 10/03/2016 1515   PROTEINUR 100 (A) 10/03/2016 1515   UROBILINOGEN 1.0 07/17/2013 1201   NITRITE POSITIVE (A) 10/03/2016 1515   LEUKOCYTESUR NEGATIVE 10/03/2016 1515    Radiological Exams on Admission: Dg Chest 2 View  Result Date: 10/03/2016 CLINICAL DATA:  Fever, hypertension, former smoker, atherosclerosis, known right middle lobe 2 cm PET positive nodule. EXAM: CHEST  2 VIEW COMPARISON:  09/08/2016, 09/29/2016 FINDINGS: Previous coronary bypass changes and aortic arch vascular stent. Stable mild cardiac enlargement with normal vascularity. Anterior right middle lobe 2 cm spiculated nodule overlies the right infrahilar vasculature and is obscured by plain radiography. Right hilar and subcarinal adenopathy by PET-CT not confidently demonstrated by plain radiography. No superimposed significant pneumonia, collapse, consolidation, or edema. No effusion or pneumothorax. Trachea is midline. Atherosclerosis noted of the aorta. Degenerative changes of the spine. IMPRESSION: No superimposed acute chest process. Cardiomegaly without CHF Thoracic aortic atherosclerosis Anterior 2 cm right middle lobe nodule, right hilar and subcarinal adenopathy demonstrated by PET-CT not well appreciated by plain radiography. Please refer to the  PET-CT of 4 days ago. Electronically Signed   By: Jerilynn Mages.  Shick M.D.   On: 10/03/2016 15:59  Echocardiogram 12/31/2014 ------------------------------------------------------------------- LV EF: 55% -   60%  ------------------------------------------------------------------- Indications:      I35.0 Aortic Stenosis.  ------------------------------------------------------------------- History:   PMH:  Acquired from the patient and from the patient&'s chart.  PMH:  AAA.  ------------------------------------------------------------------- Study Conclusions  - Left ventricle: The cavity size was normal. Systolic function was   normal. The estimated ejection fraction was in the range of 55%   to 60%. Wall motion was normal; there were no regional wall   motion abnormalities. There was an increased relative   contribution of atrial contraction to ventricular filling.   Doppler parameters are consistent with abnormal left ventricular   relaxation (grade 1 diastolic dysfunction). - Aortic valve: Mildly calcified annulus. Trileaflet; normal   thickness, mildly calcified leaflets. Transvalvular velocity was   within the normal range. There was no stenosis. - Mitral valve: Calcified annulus. There was mild regurgitation. - Pulmonic valve: There was trivial regurgitation.  EKG: Independently reviewed.   Assessment/Plan Principal Problem:   Urinary tract infection Most recent UC&S showed pseudomonas aeruginosa. Admit to telemetry/inpatient. Continue gentle IV hydration. Continue Levaquin 750 mg IVPB every 24 hours. Follow-up blood cultures and sensitivity. Follow-up urine culture and sensitivity.  Active Problems:   Hypothyroidism Continue levothyroxine 112 g by mouth daily.    Essential hypertension Hold diuretics. Continue metoprolol 12.5 mg by mouth twice a day. Monitor blood pressure.    ATRIAL FIBRILLATION CHA2DS2-VASc Score of at least 5. Continue metoprolol 12.5 mg by  mouth twice a day Continue warfarin per pharmacy.    Impaired fasting glucose Carbohydrate modified diet. Check hemoglobin A1c in a.m. CBG monitoring before meals.    Coronary artery disease involving coronary bypass graft    of native heart without angina pectoris Continue niacin and beta blocker. He is on warfarin for atrial fibrillation.  Hyperlipidemia Continue Crestor. Monitor LFTs and lipid panel as an outpatient.    Hypokalemia Check magnesium level. Replace potassium. Follow-up potassium level in a.m.    Hyponatremia Hold Bumex and Dyazide. Continue gentle normal saline infusion. Follow-up BMP in a.m.    Adenocarcinoma of right lung, stage 3 ([place]) Continue oncology follow-ups as scheduled.      DVT prophylaxis: On warfarin. Code Status: Full code. Family Communication: His wife and son were present in the emergency department. Disposition Plan: Admit for IV antibiotic therapy for 2-3 days. Consults called:  Admission status: Inpatient/telemetry.   Reubin Milan MD [facility] Pager [phone].  If 7PM-7AM, please contact night-coverage [email or web address] Password [email or web address]  10/03/2016, 5:18 PM

## 2016-10-03 NOTE — Telephone Encounter (Signed)
As fragile as this pt is, rec visit to ER for accurate testing and treatment

## 2016-10-03 NOTE — ED Provider Notes (Signed)
Emergency Department Provider Note   I have reviewed the triage vital signs and the nursing notes.   HISTORY  Chief Complaint Urinary Tract Infection and Headache   HPI [name] [name] is a 76 y.o. male with PMH of adenocarcinoma of the right lung, CAD, CKD, AAA s/p repair, HTN, and urinary retention s/p foley placement 1 month prior with recent failed void trail and foley replacement 2 days ago presents to the ED for evaluation of fever and mild HA. Patient denies any pain at this time. According to family, patient has had a fever and complaining of headache with some dark urine this morning. His Foley catheter was replaced on Saturday and prior to replacing the Foley the patient complained of some burning pain in his groin. Patient, by report, is at his baseline level of confusion which does limit my HPI and ROS. He denies any CP, coughing, SOB, or abdominal/back pain currently.   Level 5 caveat: baseline confusion.   Past Medical History:  Diagnosis Date  . Adenocarcinoma of right lung, stage 3 ([place]) 09/20/2016  . Atrial fibrillation ([place])    no hx of reported at preop visit of 04/21/11   . CAD (coronary artery disease)   . Carotid artery occlusion    left carotid endarterectomy  . Chronic kidney disease    AAA repair with reimplant of renals   . CHRONIC OBSTRUCTIVE PULMONARY DISEASE    pt denied at visit of 04/21/11   . CORONARY ARTERY DISEASE   . Disorder of left sacroiliac joint 08/24/2016  . DIVERTICULOSIS OF COLON   . GERD   . H/O hiatal hernia   . Headache(784.0)    Hx: of years of years  . HYPERTENSION   . HYPOTHYROIDISM   . JOINT EFFUSION, KNEE   . KNEE, ARTHRITIS, DEGEN./OSTEO    right knee   . Myocardial infarction ([place])    1994   . NEPHROLITHIASIS, HX OF   . OSTEOPOROSIS   . Other dysphagia   . PERIPHERAL VASCULAR DISEASE    AAA - 1994 with reimplant of renals   . Peripheral vascular disease ([place])    subclavian stenosis PTA - 3/08   . Pure  hypercholesterolemia   . Renal artery stenosis [facility])     Patient Active Problem List   Diagnosis Date Noted  . Urinary tract infection 10/03/2016  . Adenocarcinoma of right lung, stage 3 ([name] [name]) 09/20/2016  . Peripheral edema   . Slow transit constipation   . Acute lower UTI   . Confusion   . SOB (shortness of breath) 09/02/2016  . Urinary incontinence   . Abnormal urinalysis   . Leukocytosis   . HCAP (healthcare-associated pneumonia)   . Hypoalbuminemia due to protein-calorie malnutrition ([name] [name])   . Hypokalemia   . Hyponatremia   . Urinary retention   . Sleep disturbance   . Acute bilateral deep vein thrombosis (DVT) of popliteal veins ([facility])   . Brain mass 08/31/2016  . Debility   . Benign essential HTN   . Scrotal edema   . Hyperlipidemia   . Constipation due to pain medication   . Fall from height of greater than 3 feet   . Acute blood loss anemia   . Lung mass   . Post-operative pain   . PVD (peripheral vascular disease) ([place])   . Coronary artery disease involving coronary bypass graft of native heart without angina pectoris   . PAF (paroxysmal atrial fibrillation) ([name] [name])   . Disorder of left  sacroiliac joint 08/24/2016  . Fall 08/22/2016  . Closed pelvic ring fracture ([place])   . Carotid artery stenosis, asymptomatic 08/12/2014  . Ejection murmur 07/03/2014  . Aftercare following surgery of the circulatory system, [name] 02/11/2014  . Weakness-Abdomin and Bilateral Thigh / Leg 02/11/2014  . Carotid stenosis 07/19/2013  . Occlusion and stenosis of carotid artery without mention of cerebral infarction 07/16/2013  . Impaired fasting glucose 07/14/2013  . Knee pain 05/23/2011  . Stiffness of joint, not elsewhere classified, lower leg 05/23/2011  . Muscle weakness (generalized) 05/23/2011  . Difficulty in walking(719.7) 05/23/2011  . Bruit 11/19/2010  . AAA (abdominal aortic aneurysm) ([name] [name]) 11/19/2010  . OTHER DYSPHAGIA 04/27/2010  . ATRIAL FIBRILLATION 12/10/2008  .  JOINT EFFUSION, KNEE 01/14/2008  . KNEE PAIN 01/14/2008  . KNEE, ARTHRITIS, DEGEN./OSTEO 12/11/2007  . Hypothyroidism 10/10/2007  . Pure hypercholesterolemia 10/10/2007  . Essential hypertension 10/10/2007  . Coronary atherosclerosis 10/10/2007  . Peripheral vascular disease ([name] [name]) 10/10/2007  . CHRONIC OBSTRUCTIVE PULMONARY DISEASE 10/10/2007  . GERD 10/10/2007  . DIVERTICULOSIS OF COLON 10/10/2007  . OSTEOPOROSIS 10/10/2007  . NEPHROLITHIASIS, HX OF 10/10/2007  . Personal history of surgery to heart and great vessels, presenting hazards to health 10/10/2007  . CHOLECYSTECTOMY, HX OF 10/10/2007  . CORONARY ARTERY BYPASS GRAFT, HX OF 10/10/2007    Past Surgical History:  Procedure Laterality Date  . ABDOMINAL AORTIC ANEURYSM REPAIR     with reimplantation of renals   . CARDIAC CATHETERIZATION     2008  . CAROTID ENDARTERECTOMY Left Jan. 30, 2015   CEA  . CHOLECYSTECTOMY  2000   Gall Bladder  . COLONOSCOPY W/ BIOPSIES AND POLYPECTOMY     Hx: of  . CORONARY ARTERY BYPASS GRAFT     1995  . CORONARY STENT PLACEMENT     Hx: of  . ENDARTERECTOMY Left 07/19/2013   Procedure: ENDARTERECTOMY CAROTID;  Surgeon: Mal Misty, MD;  Location: [facility];  Service: Vascular;  Laterality: Left;  . GALLBLADDER SURGERY  2004   . JOINT REPLACEMENT     partial knee replacement on left 2002   . KNEE SURGERY    . ORIF PELVIC FRACTURE Left 08/25/2016   Procedure: OPEN REDUCTION INTERNAL FIXATION (ORIF) PELVIC FRACTURE; plate on front, SI screw on the back;  Surgeon: [name] Hooper, MD;  Location: [facility];  Service: Orthopedics;  Laterality: Left;  . OTHER SURGICAL HISTORY     left subclavian stenosis surgery PTA 08/2006   . OTHER SURGICAL HISTORY     carotid surgery on right 2004   . TOTAL KNEE ARTHROPLASTY  04/29/2011   Procedure: TOTAL KNEE ARTHROPLASTY;  Surgeon: [name] [name];  Location: [facility];  Service: Orthopedics;  Laterality: Right;  . urinary retention    . VASCULAR SURGERY     AAA  .  VASECTOMY  1973  . VIDEO BRONCHOSCOPY WITH ENDOBRONCHIAL ULTRASOUND [name] 09/09/2016   Procedure: VIDEO BRONCHOSCOPY WITH ENDOBRONCHIAL ULTRASOUND;  Surgeon: [name] Isaac, MD;  Location: [facility];  Service: Thoracic;  Laterality: [name];    Current Outpatient Rx  . Order #: [id number] Class: Historical Med  . Order #: [id number] Class: Print  . Order #: [id number] Class: Print  . Order #: [id number] Class: Print  . Order #: [id number] Class: Print  . Order #: [id number] Class: Print  . Order #: [id number] Class: Print  . Order #: [id number] Class: Print  . Order #: [id number] Class: Print  . Order #: [id number] Class: Print  . Order #: [id number] Class: Normal  . Order #: [id number] Class: Print  .  Order #: [id number] Class: Print  . Order #: [id number] Class: Normal  . Order #: [id number] Class: Print  . Order #: [id number] Class: Print  . Order #: [id number] Class: No Print    Allergies Imitrex [sumatriptan] and Lipitor [atorvastatin]  Family History  Problem Relation Age of Onset  . Hypertension Mother   . Heart disease Father   . Heart attack Father   . Cancer Brother     Lung  . Diabetes Sister     Social History Social History  Substance Use Topics  . Smoking status: Former Smoker    Types: Cigarettes    Quit date: 06/20/1992  . Smokeless tobacco: Never Used  . Alcohol use No    Review of Systems  Level 5 caveat: Confusion.   ____________________________________________   PHYSICAL EXAM:  VITAL SIGNS: ED Triage Vitals  Enc Vitals Group     BP 10/03/16 1425 (!) 116/48     Pulse Rate 10/03/16 1425 88     Resp --      Temp 10/03/16 1425 99 F (37.2 C)     Temp Source 10/03/16 1425 Oral     SpO2 10/03/16 1417 98 %     Weight 10/03/16 1425 141 lb (64 kg)     Height 10/03/16 1425 '5\' 6"'$  (1.676 m)     Pain Score 10/03/16 1423 6   Constitutional: Alert. Well appearing and in no acute distress.  Eyes: Conjunctivae are normal. Head: Atraumatic. Nose: No  congestion/rhinnorhea. Mouth/Throat: Mucous membranes are moist.  Oropharynx non-erythematous. Neck: No stridor.  Cardiovascular: Normal rate, regular rhythm. Good peripheral circulation. Grossly normal heart sounds.   Respiratory: Normal respiratory effort.  No retractions. Lungs CTAB. Gastrointestinal: Soft and nontender. No distention. Foley in place with dark yellow, clear urine.  Musculoskeletal: No lower extremity tenderness nor edema. No gross deformities of extremities. Neurologic:  Normal speech and language. Positive confusion. No gross focal neurologic deficits are appreciated.  Skin:  Skin is warm, dry and intact. No rash noted.  ____________________________________________   LABS (all labs ordered are listed, but only abnormal results are displayed)  Labs Reviewed  COMPREHENSIVE METABOLIC PANEL - Abnormal; Notable for the following:       Result Value   Sodium 125 (*)    Potassium 3.3 (*)    Chloride 84 (*)    Glucose, Bld 126 (*)    BUN 26 (*)    All other components within normal limits  CBC WITH DIFFERENTIAL/PLATELET - Abnormal; Notable for the following:    WBC 20.5 (*)    HCT 38.0 (*)    MCHC 36.1 (*)    Neutro Abs 18.8 (*)    All other components within normal limits  URINALYSIS, ROUTINE W REFLEX MICROSCOPIC - Abnormal; Notable for the following:    Hgb urine dipstick LARGE (*)    Protein, ur 100 (*)    Nitrite POSITIVE (*)    Bacteria, UA RARE (*)    All other components within normal limits  PROTIME-INR - Abnormal; Notable for the following:    Prothrombin Time 29.2 (*)    All other components within normal limits  CULTURE, BLOOD (ROUTINE X 2)  CULTURE, BLOOD (ROUTINE X 2)  URINE CULTURE  MAGNESIUM  PHOSPHORUS   ____________________________________________  RADIOLOGY  Dg Chest 2 View  Result Date: 10/03/2016 CLINICAL DATA:  Fever, hypertension, former smoker, atherosclerosis, known right middle lobe 2 cm PET positive nodule. EXAM: CHEST  2 VIEW  COMPARISON:  09/08/2016, 09/29/2016 FINDINGS: Previous coronary bypass changes and  aortic arch vascular stent. Stable mild cardiac enlargement with normal vascularity. Anterior right middle lobe 2 cm spiculated nodule overlies the right infrahilar vasculature and is obscured by plain radiography. Right hilar and subcarinal adenopathy by PET-CT not confidently demonstrated by plain radiography. No superimposed significant pneumonia, collapse, consolidation, or edema. No effusion or pneumothorax. Trachea is midline. Atherosclerosis noted of the aorta. Degenerative changes of the spine. IMPRESSION: No superimposed acute chest process. Cardiomegaly without CHF Thoracic aortic atherosclerosis Anterior 2 cm right middle lobe nodule, right hilar and subcarinal adenopathy demonstrated by PET-CT not well appreciated by plain radiography. Please refer to the PET-CT of 4 days ago. Electronically Signed   By: Jerilynn Mages.  Shick M.D.   On: 10/03/2016 15:59    ____________________________________________   PROCEDURES  Procedure(s) performed:   Procedures  None  ____________________________________________   INITIAL IMPRESSION / ASSESSMENT AND PLAN / ED COURSE  Pertinent labs & imaging results that were available during my care of the patient were reviewed by me and considered in my medical decision making (see chart for details).  Patient resents to the emergency room in for evaluation of fever at home with indwelling Foley catheter, recent dysuria, and mild headache. The patient has largely normal vital signs here. Non-focal exam. She is fully catheter was replaced 2 days ago. Plan for urinalysis along with blood work, blood cultures, chest x-ray to evaluate for possible fever source.   Family now at bedside reports the patient has been more sleepy than normal, fever today of 102 F, and complained of dysuria while the foley catheter has been removed over the weekend. He has a small amount or urination with the  foley out but not enough and the decision was made to replace the catheter. They have not seen a Urologist yet but this has been recommended. With UTI and symptoms along with leukocytosis I am treating this as UTI although hyponatremia may also be playing a role. Plan for admission.   Discussed patient's case with hospitalist, Dr. Olevia Bowens. Patient and family (if present) updated with plan. Care transferred to hospitalist service.  I reviewed all nursing notes, vitals, pertinent old records, EKGs, labs, imaging (as available).  ____________________________________________  FINAL CLINICAL IMPRESSION(S) / ED DIAGNOSES  Final diagnoses:  Acute cystitis without hematuria  Fever, unspecified fever cause  Generalized weakness  Hyponatremia     MEDICATIONS GIVEN DURING THIS VISIT:  Medications  levofloxacin (LEVAQUIN) IVPB 750 mg (750 mg Intravenous New Bag/Given 10/03/16 1725)  0.9 % NaCl with KCl 40 mEq / L  infusion (75 mL/hr Intravenous New Bag/Given 10/03/16 1726)  sodium chloride 0.9 % bolus 500 mL (0 mLs Intravenous Stopped 10/03/16 1601)     NEW OUTPATIENT MEDICATIONS STARTED DURING THIS VISIT:  None   Note:  This document was prepared using Dragon voice recognition software and may include unintentional dictation errors.  Nanda Quinton, MD Emergency Medicine   Margette Fast, MD 10/03/16 [phone]

## 2016-10-03 NOTE — Telephone Encounter (Signed)
Discussed with pt's wife. Wife states she will take him to [facility]. [facility] stating [facility]'s wife was notified that he needs to go to ED and she agreed to take him to [facility] ED.

## 2016-10-03 NOTE — Progress Notes (Addendum)
Pharmacy Antibiotic Note/anticoagulation  [name] [name] is a 76 y.o. male admitted on 10/03/2016 with UTI.  Pharmacy has been consulted for levaquin and continuation of coumadin. Levaquin 750 mg IV ordered in the ED.  INR today 2.69  Plan: Cont levaquin 500 mg IV q24 hours f/u renal function, cultures and clinical course Coumadin '5mg'$  tonight Daily PT/INR  Height: '5\' 6"'$  (167.6 cm) Weight: 141 lb (64 kg) IBW/kg (Calculated) : 63.8  Temp (24hrs), Avg:99 F (37.2 C), Min:99 F (37.2 C), Max:99 F (37.2 C)   Recent Labs Lab 10/03/16 1500  WBC 20.5*  CREATININE 1.10    Estimated Creatinine Clearance: 49.9 mL/min (by C-G formula based on SCr of 1.1 mg/dL).    Allergies  Allergen Reactions  . Imitrex [Sumatriptan] Nausea And Vomiting and Other (See Comments)    "made me like I was having a stoke"  . Lipitor [Atorvastatin] Nausea And Vomiting and Other (See Comments)    INTOLERANCE > MYALGIAS "couldn't get out of the bed ( pt had to crawl out of bed ) I hurt so bad"    Thank you for allowing pharmacy to be a part of this patient's care.  Excell Seltzer Poteet 10/03/2016 4:30 PM

## 2016-10-03 NOTE — Telephone Encounter (Signed)
Scheduled additional appts per 4/03 los . Patient to pick up new schedule at next visit 4/30.

## 2016-10-03 NOTE — Telephone Encounter (Signed)
Calling to report that patient has been running a fever 100.4 and just not feeling really well, along with a headache, urine dark this morning.  He was having some burning with urination on Saturday before they put his catheter back in.  Please call the patient with what the doctor recommends.

## 2016-10-04 DIAGNOSIS — E871 Hypo-osmolality and hyponatremia: Secondary | ICD-10-CM

## 2016-10-04 DIAGNOSIS — R338 Other retention of urine: Secondary | ICD-10-CM

## 2016-10-04 LAB — CBC WITH DIFFERENTIAL/PLATELET
Basophils Absolute: 0 10*3/uL (ref 0.0–0.1)
Basophils Relative: 0 %
Eosinophils Absolute: 0 10*3/uL (ref 0.0–0.7)
Eosinophils Relative: 0 %
HEMATOCRIT: 32.3 % — AB (ref 39.0–52.0)
HEMOGLOBIN: 11.6 g/dL — AB (ref 13.0–17.0)
LYMPHS ABS: 0.9 10*3/uL (ref 0.7–4.0)
Lymphocytes Relative: 5 %
MCH: 30.7 pg (ref 26.0–34.0)
MCHC: 35.9 g/dL (ref 30.0–36.0)
MCV: 85.4 fL (ref 78.0–100.0)
MONOS PCT: 5 %
Monocytes Absolute: 0.7 10*3/uL (ref 0.1–1.0)
NEUTROS ABS: 14.2 10*3/uL — AB (ref 1.7–7.7)
NEUTROS PCT: 90 %
Platelets: 156 10*3/uL (ref 150–400)
RBC: 3.78 MIL/uL — ABNORMAL LOW (ref 4.22–5.81)
RDW: 15.1 % (ref 11.5–15.5)
WBC: 15.8 10*3/uL — ABNORMAL HIGH (ref 4.0–10.5)

## 2016-10-04 LAB — BASIC METABOLIC PANEL
Anion gap: 9 (ref 5–15)
BUN: 30 mg/dL — ABNORMAL HIGH (ref 6–20)
CHLORIDE: 92 mmol/L — AB (ref 101–111)
CO2: 25 mmol/L (ref 22–32)
CREATININE: 1.15 mg/dL (ref 0.61–1.24)
Calcium: 8.6 mg/dL — ABNORMAL LOW (ref 8.9–10.3)
GFR calc non Af Amer: 59 mL/min — ABNORMAL LOW (ref >60)
Glucose, Bld: 128 mg/dL — ABNORMAL HIGH (ref 65–99)
POTASSIUM: 3.3 mmol/L — AB (ref 3.5–5.1)
Sodium: 126 mmol/L — ABNORMAL LOW (ref 135–145)

## 2016-10-04 LAB — GLUCOSE, CAPILLARY
GLUCOSE-CAPILLARY: 131 mg/dL — AB (ref 65–99)
Glucose-Capillary: 130 mg/dL — ABNORMAL HIGH (ref 65–99)
Glucose-Capillary: 148 mg/dL — ABNORMAL HIGH (ref 65–99)
Glucose-Capillary: 119 mg/dL — ABNORMAL HIGH (ref 65–99)

## 2016-10-04 LAB — PROTIME-INR
INR: 3.02
Prothrombin Time: 32 seconds — ABNORMAL HIGH (ref 11.4–15.2)

## 2016-10-04 MED ORDER — LINACLOTIDE 145 MCG PO CAPS
145.0000 ug | ORAL_CAPSULE | Freq: Every day | ORAL | Status: DC
  Administered 2016-10-04 – 2016-10-07 (×4): 145 ug via ORAL
  Filled 2016-10-04 (×4): qty 1

## 2016-10-04 MED ORDER — POTASSIUM CHLORIDE CRYS ER 20 MEQ PO TBCR
20.0000 meq | EXTENDED_RELEASE_TABLET | Freq: Every day | ORAL | Status: DC
  Administered 2016-10-04 – 2016-10-07 (×4): 20 meq via ORAL
  Filled 2016-10-04 (×4): qty 1

## 2016-10-04 MED ORDER — ALPRAZOLAM 0.25 MG PO TABS
0.2500 mg | ORAL_TABLET | Freq: Three times a day (TID) | ORAL | Status: DC | PRN
  Administered 2016-10-04: 0.25 mg via ORAL
  Filled 2016-10-04: qty 1

## 2016-10-04 MED ORDER — ACETAMINOPHEN 325 MG PO TABS
325.0000 mg | ORAL_TABLET | Freq: Four times a day (QID) | ORAL | Status: DC | PRN
  Administered 2016-10-04 – 2016-10-05 (×4): 650 mg via ORAL
  Filled 2016-10-04 (×4): qty 2

## 2016-10-04 MED ORDER — PANTOPRAZOLE SODIUM 40 MG PO TBEC
40.0000 mg | DELAYED_RELEASE_TABLET | Freq: Every day | ORAL | Status: DC
  Administered 2016-10-04 – 2016-10-07 (×4): 40 mg via ORAL
  Filled 2016-10-04 (×4): qty 1

## 2016-10-04 MED ORDER — NITROGLYCERIN 0.4 MG SL SUBL: 0.4000 mg | SUBLINGUAL_TABLET | SUBLINGUAL | Status: DC | PRN

## 2016-10-04 MED ORDER — ENSURE ENLIVE PO LIQD
237.0000 mL | Freq: Two times a day (BID) | ORAL | Status: DC
  Administered 2016-10-05 – 2016-10-07 (×6): 237 mL via ORAL

## 2016-10-04 MED ORDER — TAMSULOSIN HCL 0.4 MG PO CAPS
0.4000 mg | ORAL_CAPSULE | Freq: Every day | ORAL | Status: DC
  Administered 2016-10-04 – 2016-10-07 (×4): 0.4 mg via ORAL
  Filled 2016-10-04 (×4): qty 1

## 2016-10-04 MED ORDER — METOPROLOL TARTRATE 25 MG PO TABS
12.5000 mg | ORAL_TABLET | Freq: Two times a day (BID) | ORAL | Status: DC
  Administered 2016-10-04 – 2016-10-07 (×6): 12.5 mg via ORAL
  Filled 2016-10-04 (×7): qty 1

## 2016-10-04 MED ORDER — ROSUVASTATIN CALCIUM 20 MG PO TABS
20.0000 mg | ORAL_TABLET | Freq: Every day | ORAL | Status: DC
  Administered 2016-10-04 – 2016-10-07 (×4): 20 mg via ORAL
  Filled 2016-10-04 (×4): qty 1

## 2016-10-04 MED ORDER — GABAPENTIN 100 MG PO CAPS
100.0000 mg | ORAL_CAPSULE | Freq: Every day | ORAL | Status: DC
  Administered 2016-10-04 – 2016-10-06 (×3): 100 mg via ORAL
  Filled 2016-10-04 (×3): qty 1

## 2016-10-04 MED ORDER — NIACIN ER (ANTIHYPERLIPIDEMIC) 500 MG PO TBCR
1500.0000 mg | EXTENDED_RELEASE_TABLET | Freq: Every day | ORAL | Status: DC
  Administered 2016-10-04 – 2016-10-06 (×3): 1500 mg via ORAL
  Filled 2016-10-04 (×6): qty 3

## 2016-10-04 NOTE — Telephone Encounter (Signed)
I left message with Anderson Malta again since we did not hear back about the foley being removed.  As I was leaving her the message I see Mr Amadon has been readmitted to [facility] related to cystitis. Admit note says he had foley replaced.

## 2016-10-04 NOTE — Telephone Encounter (Signed)
[name] [name] called back and they did go out on the 12th and remove the foley and then had to go back on the 14th and replace. Now he has been admitted as previous message states.

## 2016-10-04 NOTE — Progress Notes (Signed)
Initial Nutrition Assessment  DOCUMENTATION CODES:   Non-severe (moderate) malnutrition in context of acute illness/injury (pelvic fx)  INTERVENTION:  Ensure Enlive po BID, each supplement provides 350 kcal and 20 grams of protein   Nutrition services to obtain meal preferences daily  NUTRITION DIAGNOSIS:   Malnutrition (acute injury (moderate)) related to poor appetite as evidenced by per patient/family report, mild depletion of body fat, moderate depletions of muscle mass wt loss 6% in just over 1 month. Meal intake 50-60% which is not enough to meet his est needs outlined below.   GOAL:   Patient will meet greater than or equal to 90% of their needs   MONITOR:   PO intake, Supplement acceptance, Labs, Weight trends  REASON FOR ASSESSMENT:   Malnutrition Screening Tool    ASSESSMENT: the patient is a 76 yo who was recently discharged from [facility] following a fall which he suffered a pelvic fx. He underwent ORIF of pubic symphysis on 08/25/16. During CT abdomen and pelvis incidental findings of lung carcinoma (stage 3). His hx also includes multiple revascularization procedures, CAD -s/p CABG and atrial fibrillation.  Unplanned wt loss 6% since accident in early March. He appetite has decreased over the past few weeks. His meal intake here is fair 50-60% at best per wife and daughter. He has not been cleared to walk and is in the wheelchair or bed most of time.     Nutrition-Focused physical exam completed. Findings are mild orbital, upper arm fat depletion,  Mild-moderate temporal clavicle and dorsal muscle depletion, and no edema.     Recent Labs Lab 10/03/16 1500 10/04/16 0440  NA 125* 126*  K 3.3* 3.3*  CL 84* 92*  CO2 28 25  BUN 26* 30*  CREATININE 1.10 1.15  CALCIUM 9.2 8.6*  MG 1.7  --   PHOS 3.0  --   GLUCOSE 126* 128*    Labs: Sodium 126, Potassium 3.3, BUN 30, Glu 128  Diet Order:  Diet heart healthy/carb modified Room service appropriate? Yes; Fluid  consistency: Thin  Skin:   intact  Last BM:  10/03/16  soft stool   Height:   Ht Readings from Last 1 Encounters:  10/03/16 '5\' 6"'$  (1.676 m)    Weight:   Wt Readings from Last 1 Encounters:  10/03/16 140 lb 6.4 oz (63.7 kg)    Ideal Body Weight:  65 kg  BMI:  Body mass index is 22.66 kg/m.  Estimated Nutritional Needs:   Kcal:  1920-2229 (30-35 kcal/kg)  Protein:  83-90 gr   (1.3-1.4 gr/kg)  Fluid:  1.9 liters daily   EDUCATION NEEDS:   No education needs identified at this time   Colman Cater MS,RD,CSG,LDN Office: #[phone] Pager: [phone]

## 2016-10-04 NOTE — Progress Notes (Signed)
ANTICOAGULATION CONSULT NOTE - Follow Up Consult  Pharmacy Consult for COUMADIN (home med) Indication: atrial fibrillation  Allergies  Allergen Reactions  . Imitrex [Sumatriptan] Nausea And Vomiting and Other (See Comments)    "made me like I was having a stoke"  . Lipitor [Atorvastatin] Nausea And Vomiting and Other (See Comments)    INTOLERANCE > MYALGIAS "couldn't get out of the bed ( pt had to crawl out of bed ) I hurt so bad"   Patient Measurements: Height: '5\' 6"'$  (167.6 cm) Weight: 140 lb 6.4 oz (63.7 kg) IBW/kg (Calculated) : 63.8  Vital Signs: Temp: 98.5 F (36.9 C) (04/17 0500) Temp Source: Oral (04/17 0500) BP: 114/43 (04/17 0500) Pulse Rate: 86 (04/17 0500)  Labs:  Recent Labs  10/03/16 1500 10/03/16 1657 10/04/16 0440  HGB 13.7  --  11.6*  HCT 38.0*  --  32.3*  PLT 182  --  156  LABPROT  --  29.2* 32.0*  INR  --  2.69 3.02  CREATININE 1.10  --  1.15   Estimated Creatinine Clearance: 47.7 mL/min (by C-G formula based on SCr of 1.15 mg/dL).  Medications:  Prescriptions Prior to Admission  Medication Sig Dispense Refill Last Dose  . acetaminophen (TYLENOL) 325 MG tablet Take 325-650 mg by mouth every 6 (six) hours as needed for headache (for pain).    10/02/2016 at Unknown time  . ALPRAZolam (XANAX) 0.25 MG tablet Take 1 tablet (0.25 mg total) by mouth 3 (three) times daily as needed for anxiety. 30 tablet 0 Past Month at Unknown time  . bumetanide (BUMEX) 1 MG tablet Take 1 tablet (1 mg total) by mouth daily. 30 tablet 1 10/03/2016 at Unknown time  . gabapentin (NEURONTIN) 100 MG capsule Take 1 capsule (100 mg total) by mouth at bedtime. 30 capsule 1 10/02/2016 at Unknown time  . lansoprazole (PREVACID) 15 MG capsule Take 1 capsule (15 mg total) by mouth daily. 30 capsule 5 10/03/2016 at Unknown time  . levothyroxine (SYNTHROID, LEVOTHROID) 112 MCG tablet Take 1 tablet (112 mcg total) by mouth daily. 30 tablet 5 10/03/2016 at Unknown time  . linaclotide (LINZESS)  145 MCG CAPS capsule Take 1 capsule (145 mcg total) by mouth daily before breakfast. 30 capsule 1 10/03/2016 at Unknown time  . metoprolol (LOPRESSOR) 25 MG tablet Take 0.5 tablets (12.5 mg total) by mouth 2 (two) times daily. 60 tablet 0 10/03/2016 at 830a  . niacin (SLO-NIACIN) 500 MG tablet Take 3 tablets (1,500 mg total) by mouth at bedtime. 30 tablet 0 10/02/2016 at Unknown time  . nitroGLYCERIN (NITROSTAT) 0.4 MG SL tablet Place 1 tablet (0.4 mg total) under the tongue every 5 (five) minutes as needed for chest pain. 25 tablet 10 unknown  . ondansetron (ZOFRAN) 8 MG tablet Take 1 tablet (8 mg total) by mouth every 8 (eight) hours as needed for nausea or vomiting. 20 tablet 0 unknown  . potassium chloride SA (K-DUR,KLOR-CON) 20 MEQ tablet Take 1 tablet (20 mEq total) by mouth daily. 30 tablet 0 10/03/2016 at Unknown time  . rosuvastatin (CRESTOR) 20 MG tablet Take 1 tablet (20 mg total) by mouth daily. 30 tablet 5 Past Week at Unknown time  . tamsulosin (FLOMAX) 0.4 MG CAPS capsule Take 1 capsule (0.4 mg total) by mouth daily after breakfast. 30 capsule 1 10/03/2016 at Unknown time  . triamterene-hydrochlorothiazide (MAXZIDE-25) 37.5-25 MG tablet Take 1 tablet by mouth every morning. 30 tablet 5 10/03/2016 at Unknown time  . warfarin (COUMADIN) 5 MG tablet  1-1/2 tablet every day except 2 tablets Tuesday Thursday Saturday (Patient taking differently: 1 tablet every day except 1-1/2 tablets Tuesday Thursday Saturday) 90 tablet 1 10/02/2016 at 2000  . senna-docusate (SENOKOT-S) 8.6-50 MG tablet Take 2 tablets by mouth 2 (two) times daily. (Patient not taking: Reported on 10/03/2016)   Not Taking at Unknown time    Assessment: 76yo male on chronic Coumadin for h/o afib.  Today INR is slightly above goal (> 3).  Pt also on Levaquin which can interact with warfarin to increase INR.  Goal of Therapy:  INR 2-3 Monitor platelets by anticoagulation protocol: Yes   Plan:  HOLD coumadin today INR  daily Monitor for s/sx bleeding  Hart Robinsons A 10/04/2016,10:30 AM

## 2016-10-04 NOTE — Progress Notes (Signed)
PROGRESS NOTE  [name] [name] NFA:[id number] DOB: [birth year] DOA: 10/03/2016 PCP: Mickie Hillier, MD  Brief Narrative: 29yom PMH lung cancer with metastatic disease to the brain, COPD, atrial fibrillation, presented with HA, fever, dark urine. Recently hospitalized at [facility] and foley left for urinary retention. Admitted for UTI, hyponatremia.  Fever, indwelling foley  Assessment/Plan 1. Hypovolemic hyponatremia. Acute on chronic, likely SIADH.   Dyazide on hold.  Continue IVF  Recheck BMP in AM  2. UTI, catheter-associated, catheter present on admission  Continue empiric abx, no change given clinical improvement. Follow-up culture.  3. Urinary retention, has failed multiple voiding attempts per wife.  Continue foley, do not remove.  Has been unable to secure outpt urology f/u before June. Will assist if possible to obtain earlier appointment.  4. Lung cancer with metastatic disease to the brain 5. COPD, stable. 6. Atrial fibrillation, continue warfarin.   Improving. Continue IV abx and IVF. Recheck sodium in AM. May be able to go home 4/18.  DVT prophylaxis: warfarin Code Status: full Family Communication: wife at bedside Disposition Plan: home    Murray Hodgkins, MD  [facility] Direct contact: [phone] --[address]  --[email or web address]; password [email or web address]  7PM-7AM contact night coverage as above 10/04/2016, 4:13 PM  LOS: 1 day   Consultants:    Procedures:    Antimicrobials:  Levaquin 4/16 >>  Interval history/Subjective: Feels better, no fever, no HA, no complaints.  Objective: Vitals:   10/03/16 1935 10/03/16 2156 10/04/16 0500 10/04/16 1300  BP: (!) 113/40 (!) 91/39 (!) 114/43 (!) 104/38  Pulse: 72 76 86 (!) 55  Resp: '20 16 20 18  '$ Temp: 98.6 F (37 C) 98.4 F (36.9 C) 98.5 F (36.9 C) 97.5 F (36.4 C)  TempSrc: Oral Oral Oral Oral  SpO2: 100% 97% 98% 98%  Weight: 63.7 kg (140 lb 6.4 oz)     Height: '5\' 6"'$  (1.676 m)        Intake/Output Summary (Last 24 hours) at 10/04/16 1613 Last data filed at 10/04/16 1216  Gross per 24 hour  Intake           1172.5 ml  Output             1300 ml  Net           -127.5 ml     Filed Weights   10/03/16 1425 10/03/16 1935  Weight: 64 kg (141 lb) 63.7 kg (140 lb 6.4 oz)    Exam:    Constitutional: appears calm and comfortable CV: RRR no m/r/g. No LE edema. Respiratory: CTA bilaterally no w/r/r. Normal respiratory effort. Psych: normal mood and affect, speech fluent and appropriate.   I have personally reviewed the following:   Labs:  CBG stable  K+ 3.3  Sodium 126  WBC 15.8  Normal plts  INR 3.02  Urinalysis equivocal  Imaging studies:  CXR no acute disease, independently reviewed  Medical tests:    Scheduled Meds: . gabapentin  100 mg Oral QHS  . levothyroxine  112 mcg Oral QAC breakfast  . linaclotide  145 mcg Oral QAC breakfast  . metoprolol tartrate  12.5 mg Oral BID  . niacin  1,500 mg Oral QHS  . pantoprazole  40 mg Oral Daily  . potassium chloride SA  20 mEq Oral Daily  . rosuvastatin  20 mg Oral Daily  . tamsulosin  0.4 mg Oral QPC breakfast  . Warfarin - Pharmacist Dosing Inpatient   Does not apply [phone]  Continuous Infusions: . 0.9 % NaCl with KCl 40 mEq / L 75 mL/hr (10/04/16 0819)  . levofloxacin (LEVAQUIN) IV    . sodium chloride      Principal Problem:   Urinary tract infection Active Problems:   Hypothyroidism   Essential hypertension   ATRIAL FIBRILLATION   Impaired fasting glucose   Coronary artery disease involving coronary bypass graft of native heart without angina pectoris   Hyperlipidemia   Hypokalemia   Hyponatremia   Adenocarcinoma of right lung, stage 3 (HCC)   LOS: 1 day

## 2016-10-04 NOTE — Care Management Note (Signed)
Case Management Note  Patient Details  Name: [name] [name] MRN: [id number] Date of Birth: December 06, 1940  Subjective/Objective:   Adm from home with UTI. Lives with family. Recent Inpatient rehab stay per DIL. Foley x 1 month for urinary retention. Active with [facility] for RN and PT.                  Action/Plan: Anticipate DC home in care of family. Will need resumption orders for [facility].    Expected Discharge Date:    10/06/2016              Expected Discharge Plan:  [place]  In-House Referral:     Discharge planning Services  CM Consult  Post Acute Care Choice:    Choice offered to:     DME Arranged:    DME Agency:     HH Arranged:    [place] Agency:  [facility]  Status of Service:  In process, will continue to follow  If discussed at Long Length of Stay Meetings, dates discussed:    Additional Comments:  Kamalani Mastro, Chauncey Reading, RN 10/04/2016, 3:15 PM

## 2016-10-04 NOTE — Progress Notes (Signed)
Thoracic Location of Tumor / Histology: Non small cell lung cancer  The patient was on a ladder working at his house when he fell down from 20 feet height. He was evaluated at the emergency department at [facility] and CT scan of the head and neck were performed on 08/22/2016. CT of the head without contrast showed abnormal anterior left frontal lobe with a suspicious for 2 cm brain mass with mild surrounding vasogenic edema but MRI of the brain was recommended which was later performed on 09/02/2016 and showed the left frontal lesion favoring a subacute to chronic hemorrhagic infarct or cortical confusion but malignancy is not completely excluded. During his evaluation at [facility] CT scan of the chest, abdomen and pelvis were performed on 08/22/2016 and it showed a lobe with soft tissue stranding into the periphery, consistent with primary carcinoma of the lung. There is a vague the 0.7 cm area of abnormal density in the posterior aspect of the left upper lobe and 2 small nodular densities in the right upper lobe measuring 0.4 and 0.2 cm. The scan also showed 3.7 x 2.3 cm enlarging subcarinal lymph node.    Tobacco/Marijuana/Snuff/ETOH use: Former smoker. Quit 1994.  Past/Anticipated interventions by cardiothoracic surgery, if any: no  Past/Anticipated interventions by medical oncology, if any: If there is no evidence of metastatic disease Mohamed recommends a course of concurrent chemoradiation with weekly carboplatin for AUC of 2 and paclitaxel 45 MG/M2.   Signs/Symptoms  Weight changes, if any: 17 lb in the last few weeks. Referred to [facility] by Dr. Julien Nordmann  Respiratory complaints, if any: Denies chest pain or shortness of breath. Reports a mild productive cough.  Hemoptysis, if any: Denies  Pain issues, if any:    SAFETY ISSUES:  Prior radiation?   Pacemaker/ICD?    Possible current pregnancy?no  Is the patient on methotrexate?   Current Complaints / other  details:  76 year old male. Married. Complains of weakness in lower extremities worse on the left. Suffers from urinary retention thus, wears a foley catheter. Denies headache or visual changes.

## 2016-10-05 ENCOUNTER — Telehealth: Payer: Self-pay | Admitting: *Deleted

## 2016-10-05 ENCOUNTER — Other Ambulatory Visit: Payer: Self-pay | Admitting: Radiation Therapy

## 2016-10-05 ENCOUNTER — Inpatient Hospital Stay (HOSPITAL_COMMUNITY): Payer: Medicare Other

## 2016-10-05 ENCOUNTER — Ambulatory Visit
Admission: RE | Admit: 2016-10-05 | Discharge: 2016-10-05 | Disposition: A | Discharge status: Home / Self Care | Payer: Medicare Other | Source: Ambulatory Visit | Attending: Radiation Oncology | Admitting: Radiation Oncology

## 2016-10-05 ENCOUNTER — Ambulatory Visit: Payer: Medicare Other

## 2016-10-05 DIAGNOSIS — Z510 Encounter for antineoplastic radiation therapy: Secondary | ICD-10-CM | POA: Insufficient documentation

## 2016-10-05 DIAGNOSIS — Z8673 Personal history of transient ischemic attack (TIA), and cerebral infarction without residual deficits: Secondary | ICD-10-CM | POA: Insufficient documentation

## 2016-10-05 DIAGNOSIS — C3491 Malignant neoplasm of unspecified part of right bronchus or lung: Secondary | ICD-10-CM

## 2016-10-05 DIAGNOSIS — M810 Age-related osteoporosis without current pathological fracture: Secondary | ICD-10-CM | POA: Insufficient documentation

## 2016-10-05 DIAGNOSIS — Z951 Presence of aortocoronary bypass graft: Secondary | ICD-10-CM | POA: Insufficient documentation

## 2016-10-05 DIAGNOSIS — Z955 Presence of coronary angioplasty implant and graft: Secondary | ICD-10-CM | POA: Insufficient documentation

## 2016-10-05 DIAGNOSIS — I4891 Unspecified atrial fibrillation: Secondary | ICD-10-CM

## 2016-10-05 DIAGNOSIS — Z833 Family history of diabetes mellitus: Secondary | ICD-10-CM | POA: Insufficient documentation

## 2016-10-05 DIAGNOSIS — Z96653 Presence of artificial knee joint, bilateral: Secondary | ICD-10-CM | POA: Insufficient documentation

## 2016-10-05 DIAGNOSIS — I252 Old myocardial infarction: Secondary | ICD-10-CM | POA: Insufficient documentation

## 2016-10-05 DIAGNOSIS — K219 Gastro-esophageal reflux disease without esophagitis: Secondary | ICD-10-CM | POA: Insufficient documentation

## 2016-10-05 DIAGNOSIS — I10 Essential (primary) hypertension: Secondary | ICD-10-CM

## 2016-10-05 DIAGNOSIS — Z801 Family history of malignant neoplasm of trachea, bronchus and lung: Secondary | ICD-10-CM | POA: Insufficient documentation

## 2016-10-05 DIAGNOSIS — Z87891 Personal history of nicotine dependence: Secondary | ICD-10-CM | POA: Insufficient documentation

## 2016-10-05 DIAGNOSIS — I129 Hypertensive chronic kidney disease with stage 1 through stage 4 chronic kidney disease, or unspecified chronic kidney disease: Secondary | ICD-10-CM | POA: Insufficient documentation

## 2016-10-05 DIAGNOSIS — R509 Fever, unspecified: Secondary | ICD-10-CM

## 2016-10-05 DIAGNOSIS — I2510 Atherosclerotic heart disease of native coronary artery without angina pectoris: Secondary | ICD-10-CM | POA: Insufficient documentation

## 2016-10-05 DIAGNOSIS — Z87442 Personal history of urinary calculi: Secondary | ICD-10-CM | POA: Insufficient documentation

## 2016-10-05 DIAGNOSIS — Z8249 Family history of ischemic heart disease and other diseases of the circulatory system: Secondary | ICD-10-CM | POA: Insufficient documentation

## 2016-10-05 DIAGNOSIS — Z7901 Long term (current) use of anticoagulants: Secondary | ICD-10-CM | POA: Insufficient documentation

## 2016-10-05 DIAGNOSIS — M1711 Unilateral primary osteoarthritis, right knee: Secondary | ICD-10-CM | POA: Insufficient documentation

## 2016-10-05 DIAGNOSIS — E039 Hypothyroidism, unspecified: Secondary | ICD-10-CM | POA: Insufficient documentation

## 2016-10-05 DIAGNOSIS — C342 Malignant neoplasm of middle lobe, bronchus or lung: Secondary | ICD-10-CM | POA: Insufficient documentation

## 2016-10-05 DIAGNOSIS — I739 Peripheral vascular disease, unspecified: Secondary | ICD-10-CM | POA: Insufficient documentation

## 2016-10-05 DIAGNOSIS — E038 Other specified hypothyroidism: Secondary | ICD-10-CM

## 2016-10-05 DIAGNOSIS — N189 Chronic kidney disease, unspecified: Secondary | ICD-10-CM | POA: Insufficient documentation

## 2016-10-05 LAB — GLUCOSE, CAPILLARY
GLUCOSE-CAPILLARY: 152 mg/dL — AB (ref 65–99)
Glucose-Capillary: 108 mg/dL — ABNORMAL HIGH (ref 65–99)
Glucose-Capillary: 174 mg/dL — ABNORMAL HIGH (ref 65–99)
Glucose-Capillary: 171 mg/dL — ABNORMAL HIGH (ref 65–99)

## 2016-10-05 LAB — BASIC METABOLIC PANEL
Anion gap: 10 (ref 5–15)
BUN: 26 mg/dL — AB (ref 6–20)
CO2: 21 mmol/L — ABNORMAL LOW (ref 22–32)
CREATININE: 0.92 mg/dL (ref 0.61–1.24)
Calcium: 8.2 mg/dL — ABNORMAL LOW (ref 8.9–10.3)
Chloride: 97 mmol/L — ABNORMAL LOW (ref 101–111)
Glucose, Bld: 121 mg/dL — ABNORMAL HIGH (ref 65–99)
Potassium: 4.2 mmol/L (ref 3.5–5.1)
SODIUM: 128 mmol/L — AB (ref 135–145)

## 2016-10-05 LAB — PROTIME-INR
INR: 3.41
PROTHROMBIN TIME: 35.2 s — AB (ref 11.4–15.2)

## 2016-10-05 LAB — HEMOGLOBIN A1C
HEMOGLOBIN A1C: 5.4 % (ref 4.8–5.6)
Mean Plasma Glucose: 108 mg/dL

## 2016-10-05 NOTE — Progress Notes (Signed)
ANTICOAGULATION CONSULT NOTE - Follow Up Consult  Pharmacy Consult for COUMADIN (home med) Indication: atrial fibrillation  Allergies  Allergen Reactions  . Imitrex [Sumatriptan] Nausea And Vomiting and Other (See Comments)    "made me like I was having a stoke"  . Lipitor [Atorvastatin] Nausea And Vomiting and Other (See Comments)    INTOLERANCE > MYALGIAS "couldn't get out of the bed ( pt had to crawl out of bed ) I hurt so bad"   Patient Measurements: Height: '5\' 6"'$  (167.6 cm) Weight: 140 lb 6.4 oz (63.7 kg) IBW/kg (Calculated) : 63.8  Vital Signs: Temp: 98.4 F (36.9 C) (04/18 0532) Temp Source: Oral (04/18 0532) BP: 116/51 (04/18 0925) Pulse Rate: 66 (04/18 0925)  Labs:  Recent Labs  10/03/16 1500 10/03/16 1657 10/04/16 0440 10/05/16 0606  HGB 13.7  --  11.6*  --   HCT 38.0*  --  32.3*  --   PLT 182  --  156  --   LABPROT  --  29.2* 32.0* 35.2*  INR  --  2.69 3.02 3.41  CREATININE 1.10  --  1.15 0.92   Estimated Creatinine Clearance: 59.6 mL/min (by C-G formula based on SCr of 0.92 mg/dL).  Medications:  Prescriptions Prior to Admission  Medication Sig Dispense Refill Last Dose  . acetaminophen (TYLENOL) 325 MG tablet Take 325-650 mg by mouth every 6 (six) hours as needed for headache (for pain).    10/02/2016 at Unknown time  . ALPRAZolam (XANAX) 0.25 MG tablet Take 1 tablet (0.25 mg total) by mouth 3 (three) times daily as needed for anxiety. 30 tablet 0 Past Month at Unknown time  . bumetanide (BUMEX) 1 MG tablet Take 1 tablet (1 mg total) by mouth daily. 30 tablet 1 10/03/2016 at Unknown time  . gabapentin (NEURONTIN) 100 MG capsule Take 1 capsule (100 mg total) by mouth at bedtime. 30 capsule 1 10/02/2016 at Unknown time  . lansoprazole (PREVACID) 15 MG capsule Take 1 capsule (15 mg total) by mouth daily. 30 capsule 5 10/03/2016 at Unknown time  . levothyroxine (SYNTHROID, LEVOTHROID) 112 MCG tablet Take 1 tablet (112 mcg total) by mouth daily. 30 tablet 5  10/03/2016 at Unknown time  . linaclotide (LINZESS) 145 MCG CAPS capsule Take 1 capsule (145 mcg total) by mouth daily before breakfast. 30 capsule 1 10/03/2016 at Unknown time  . metoprolol (LOPRESSOR) 25 MG tablet Take 0.5 tablets (12.5 mg total) by mouth 2 (two) times daily. 60 tablet 0 10/03/2016 at 830a  . niacin (SLO-NIACIN) 500 MG tablet Take 3 tablets (1,500 mg total) by mouth at bedtime. 30 tablet 0 10/02/2016 at Unknown time  . nitroGLYCERIN (NITROSTAT) 0.4 MG SL tablet Place 1 tablet (0.4 mg total) under the tongue every 5 (five) minutes as needed for chest pain. 25 tablet 10 unknown  . ondansetron (ZOFRAN) 8 MG tablet Take 1 tablet (8 mg total) by mouth every 8 (eight) hours as needed for nausea or vomiting. 20 tablet 0 unknown  . potassium chloride SA (K-DUR,KLOR-CON) 20 MEQ tablet Take 1 tablet (20 mEq total) by mouth daily. 30 tablet 0 10/03/2016 at Unknown time  . rosuvastatin (CRESTOR) 20 MG tablet Take 1 tablet (20 mg total) by mouth daily. 30 tablet 5 Past Week at Unknown time  . tamsulosin (FLOMAX) 0.4 MG CAPS capsule Take 1 capsule (0.4 mg total) by mouth daily after breakfast. 30 capsule 1 10/03/2016 at Unknown time  . triamterene-hydrochlorothiazide (MAXZIDE-25) 37.5-25 MG tablet Take 1 tablet by mouth every morning.  30 tablet 5 10/03/2016 at Unknown time  . warfarin (COUMADIN) 5 MG tablet 1-1/2 tablet every day except 2 tablets Tuesday Thursday Saturday (Patient taking differently: 1 tablet every day except 1-1/2 tablets Tuesday Thursday Saturday) 90 tablet 1 10/02/2016 at 2000  . senna-docusate (SENOKOT-S) 8.6-50 MG tablet Take 2 tablets by mouth 2 (two) times daily. (Patient not taking: Reported on 10/03/2016)   Not Taking at Unknown time    Assessment: 76yo male on chronic Coumadin for h/o afib.  Today INR is slightly above goal (> 3).  Pt also on Levaquin which can interact with warfarin to increase INR.  Goal of Therapy:  INR 2-3 Monitor platelets by anticoagulation protocol:  Yes   Plan:  HOLD coumadin today INR daily Monitor for s/sx bleeding  Isac Sarna, BS Vena [place], BCPS Clinical Pharmacist Pager [phone] 10/05/2016,12:31 PM

## 2016-10-05 NOTE — Care Management Important Message (Signed)
Important Message  Patient Details  Name: [name] [name] MRN: [id number] Date of Birth: 19-Sep-1940   Medicare Important Message Given:  Yes    Sintia Mckissic, Chauncey Reading, RN 10/05/2016, 11:20 AM

## 2016-10-05 NOTE — Telephone Encounter (Signed)
Oncology Nurse Navigator Documentation  Oncology Nurse Navigator Flowsheets 10/05/2016  Navigator Location [facility]  Navigator Encounter Type Telephone/I received a message yesterday that [name] [name] is in the hospital at [facility].  I called [name] [name] to get an update. She is frustrated that a urologist is not seeing patient.  I listened as she explained.  I spoke with Dr. Julien Nordmann and he states [facility] needs to get this patient seen.  I explained this to [name] [name] and asked that the nurse call me if she has question.  I gave [name] [name] my number.    Telephone Outgoing Call  Treatment Phase Pre-Tx/Tx Discussion  Barriers/Navigation Needs Education  Education Other  Interventions Education  Education Method Verbal  Acuity Level 2  Time Spent with Patient 30

## 2016-10-05 NOTE — Telephone Encounter (Signed)
Oncology Nurse Navigator Documentation  Oncology Nurse Navigator Flowsheets 10/05/2016  Navigator Location [facility]  Navigator Encounter Type Telephone/I received a call from [facility] hospital and I spoke with [name] [name] nurse. She updated me that patient has foley cath and they are giving antibiotics for infection.  I asked the nurse to see if urology could see patient.  Nurse states she will talk with hospitalist today about plan.  [name] [name] called me and updated me.  I supported her in her efforts to be an advocate for her husband.    Telephone Incoming Call;Outgoing Call  Treatment Phase Pre-Tx/Tx Discussion  Barriers/Navigation Needs Education  Education Other  Interventions Education  Education Method Verbal  Acuity Level 2  Time Spent with Patient 30

## 2016-10-05 NOTE — Progress Notes (Signed)
PROGRESS NOTE    [name] [name]  XBM:[id number] DOB: June 17, 1941 DOA: 10/03/2016 PCP: Mickie Hillier, MD    Brief Narrative: 76 year old male with history of lung cancer with metastatic disease to brain, COPD, atrial fibrillation, presented with headache, fever and dark urine. He was recently hospitalized and found to have urinary retention. He's had a chronic Foley catheter in place for about a month now. He has had approximately 3 UTIs in the past month. Admitted for urinary tract infection and hyponatremia. Currently on antibiotics with urine culture in process. Plans are for discharge home on antibiotics with outpatient urology follow-up.   Assessment & Plan:   Principal Problem:   Urinary tract infection Active Problems:   Hypothyroidism   Essential hypertension   ATRIAL FIBRILLATION   Impaired fasting glucose   Coronary artery disease involving coronary bypass graft of native heart without angina pectoris   Hyperlipidemia   Hypokalemia   Hyponatremia   Adenocarcinoma of right lung, stage 3 ([facility])   1. Hypovolemic hyponatremia. Acute on chronic. Holding Dyazide. Continue gentle hydration. Slowly improving.  2. Urinary tract infection, catheter associated, catheter present on admission. He has had a Foley catheter in place since his discharge from the hospital. He will need outpatient follow-up with urology. Urine culture currently in process.  3. Urinary retention. Has failed several voiding trials per wife. Continue Foley catheter. Renal ultrasound done today felt very unremarkable. He had a CT scan of abdomen and pelvis done the beginning of March that showed mild prostate enlargement otherwise no urinary abnormalities. Discussed with Dr. Noah Delaine on call for urology who recommended outpatient follow-up with urology. He also recommended starting the patient on daily trimethoprim once his current antibiotic course has been completed.  4. Lung cancer with metastatic disease to the  brain. Continue follow-up with oncology as previously scheduled.  5. History of atrial fibrillation. Continue on warfarin.  6. Recent bilateral lower extremity DVTs. He is anticoagulated with warfarin. This is likely related to immobilization from recent fall and sacral fractures.  7. COPD. Stable   DVT prophylaxis: Coumadin Code Status: Full code Family Communication: Discussed with wife at the bedside Disposition Plan: Discharge home once improved   Consultants:     Procedures:     Antimicrobials:   Levaquin 4/17>>   Subjective: No shortness of breath or cough. Overall he is feeling better.  Objective: Vitals:   10/04/16 2103 10/05/16 0532 10/05/16 0925 10/05/16 1446  BP: (!) 115/50 (!) 114/48 (!) 116/51 (!) 124/54  Pulse: 69 75 66 64  Resp: '18 18  18  '$ Temp: 98.4 F (36.9 C) 98.4 F (36.9 C)  97.5 F (36.4 C)  TempSrc: Oral Oral  Oral  SpO2: 99% 98%  99%  Weight:      Height:        Intake/Output Summary (Last 24 hours) at 10/05/16 1848 Last data filed at 10/05/16 1802  Gross per 24 hour  Intake          2133.33 ml  Output              501 ml  Net          1632.33 ml   Filed Weights   10/03/16 1425 10/03/16 1935  Weight: 64 kg (141 lb) 63.7 kg (140 lb 6.4 oz)    Examination:  General exam: Appears calm and comfortable  Respiratory system: Clear to auscultation. Respiratory effort normal. Cardiovascular system: S1 & S2 heard, RRR. No JVD, murmurs, rubs, gallops or clicks.  No pedal edema. Gastrointestinal system: Abdomen is nondistended, soft and nontender. No organomegaly or masses felt. Normal bowel sounds heard. Central nervous system: Alert and oriented. No focal neurological deficits. Extremities: Symmetric 5 x 5 power. Skin: No rashes, lesions or ulcers Psychiatry: Judgement and insight appear normal. Mood & affect appropriate.     Data Reviewed: I have personally reviewed following labs and imaging studies  CBC:  Recent Labs Lab  10/03/16 1500 10/04/16 0440  WBC 20.5* 15.8*  NEUTROABS 18.8* 14.2*  HGB 13.7 11.6*  HCT 38.0* 32.3*  MCV 84.8 85.4  PLT 182 397   Basic Metabolic Panel:  Recent Labs Lab 10/03/16 1500 10/04/16 0440 10/05/16 0606  NA 125* 126* 128*  K 3.3* 3.3* 4.2  CL 84* 92* 97*  CO2 28 25 21*  GLUCOSE 126* 128* 121*  BUN 26* 30* 26*  CREATININE 1.10 1.15 0.92  CALCIUM 9.2 8.6* 8.2*  MG 1.7  --   --   PHOS 3.0  --   --    GFR: Estimated Creatinine Clearance: 59.6 mL/min (by C-G formula based on SCr of 0.92 mg/dL). Liver Function Tests:  Recent Labs Lab 10/03/16 1500  AST 34  ALT 22  ALKPHOS 98  BILITOT 0.9  PROT 7.6  ALBUMIN 3.7   No results for input(s): LIPASE, AMYLASE in the last 168 hours. No results for input(s): AMMONIA in the last 168 hours. Coagulation Profile:  Recent Labs Lab 10/03/16 1657 10/04/16 0440 10/05/16 0606  INR 2.69 3.02 3.41   Cardiac Enzymes: No results for input(s): CKTOTAL, CKMB, CKMBINDEX, TROPONINI in the last 168 hours. BNP (last 3 results) No results for input(s): PROBNP in the last 8760 hours. HbA1C:  Recent Labs  10/03/16 1500  HGBA1C 5.4   CBG:  Recent Labs Lab 10/04/16 1637 10/04/16 2103 10/05/16 0742 10/05/16 1114 10/05/16 1620  GLUCAP 148* 119* 108* 152* 174*   Lipid Profile: No results for input(s): CHOL, HDL, LDLCALC, TRIG, CHOLHDL, LDLDIRECT in the last 72 hours. Thyroid Function Tests: No results for input(s): TSH, T4TOTAL, FREET4, T3FREE, THYROIDAB in the last 72 hours. Anemia Panel: No results for input(s): VITAMINB12, FOLATE, FERRITIN, TIBC, IRON, RETICCTPCT in the last 72 hours. Sepsis Labs: No results for input(s): PROCALCITON, LATICACIDVEN in the last 168 hours.  Recent Results (from the past 240 hour(s))  Culture, blood (routine x 2)     Status: None (Preliminary result)   Collection Time: 10/03/16  3:04 PM  Result Value Ref Range Status   Specimen Description RIGHT ANTECUBITAL  Final   Special  Requests   Final    BOTTLES DRAWN AEROBIC AND ANAEROBIC Blood Culture adequate volume   Culture NO GROWTH 2 DAYS  Final   Report Status PENDING  Incomplete  Culture, blood (routine x 2)     Status: None (Preliminary result)   Collection Time: 10/03/16  3:12 PM  Result Value Ref Range Status   Specimen Description LEFT ANTECUBITAL  Final   Special Requests   Final    BOTTLES DRAWN AEROBIC AND ANAEROBIC Blood Culture adequate volume   Culture NO GROWTH 2 DAYS  Final   Report Status PENDING  Incomplete  Urine culture     Status: Abnormal (Preliminary result)   Collection Time: 10/03/16  3:15 PM  Result Value Ref Range Status   Specimen Description URINE, CLEAN CATCH  Final   Special Requests NONE  Final   Culture >=100,000 COLONIES/mL PSEUDOMONAS AERUGINOSA (A)  Final   Report Status PENDING  Incomplete  Radiology Studies: US Renal  Result Date: 10/05/2016 CLINICAL DATA:  UTI. EXAM: RENAL / URINARY TRACT ULTRASOUND COMPLETE COMPARISON:  PET-CT 09/29/2016. FINDINGS: Right Kidney: Length: 12.5 cm. Echogenicity within normal limits. No mass or hydronephrosis visualized. Left Kidney: Length: 12.3 cm. Echogenicity within normal limits. No mass or hydronephrosis visualized. Bladder: Foley catheter in the bladder.  Bladder nondistended. IMPRESSION: No acute or focal abnormality identified. No evidence of hydronephrosis. Foley catheter noted in the bladder. Bladder nondistended. Electronically Signed   By: Marcello Moores  Register   On: 10/05/2016 14:26        Scheduled Meds: . feeding supplement (ENSURE ENLIVE)  237 mL Oral BID BM  . gabapentin  100 mg Oral QHS  . levothyroxine  112 mcg Oral QAC breakfast  . linaclotide  145 mcg Oral QAC breakfast  . metoprolol tartrate  12.5 mg Oral BID  . niacin  1,500 mg Oral QHS  . pantoprazole  40 mg Oral Daily  . potassium chloride SA  20 mEq Oral Daily  . rosuvastatin  20 mg Oral Daily  . tamsulosin  0.4 mg Oral QPC breakfast  . Warfarin -  Pharmacist Dosing Inpatient   Does not apply q1800   Continuous Infusions: . 0.9 % NaCl with KCl 40 mEq / L 100 mL/hr (10/05/16 1438)  . levofloxacin (LEVAQUIN) IV Stopped (10/05/16 1832)  . sodium chloride       LOS: 2 days    Time spent: 14mns    Navea Woodrow, MD [facility] Pager [phone] If 7PM-7AM, please contact night-coverage [email or web address] Password [email or web address]4/18/2018, 6:48 PM

## 2016-10-06 DIAGNOSIS — E440 Moderate protein-calorie malnutrition: Secondary | ICD-10-CM | POA: Insufficient documentation

## 2016-10-06 DIAGNOSIS — N3000 Acute cystitis without hematuria: Secondary | ICD-10-CM

## 2016-10-06 LAB — GLUCOSE, CAPILLARY
GLUCOSE-CAPILLARY: 95 mg/dL (ref 65–99)
GLUCOSE-CAPILLARY: 129 mg/dL — AB (ref 65–99)
Glucose-Capillary: 127 mg/dL — ABNORMAL HIGH (ref 65–99)

## 2016-10-06 LAB — BASIC METABOLIC PANEL
ANION GAP: 7 (ref 5–15)
BUN: 18 mg/dL (ref 6–20)
CALCIUM: 8.2 mg/dL — AB (ref 8.9–10.3)
CHLORIDE: 100 mmol/L — AB (ref 101–111)
CO2: 22 mmol/L (ref 22–32)
Creatinine, Ser: 0.81 mg/dL (ref 0.61–1.24)
GFR calc Af Amer: >60 mL/min (ref >60)
GFR calc non Af Amer: >60 mL/min (ref >60)
GLUCOSE: 100 mg/dL — AB (ref 65–99)
Potassium: 4.6 mmol/L (ref 3.5–5.1)
Sodium: 129 mmol/L — ABNORMAL LOW (ref 135–145)

## 2016-10-06 LAB — PROTIME-INR
INR: 2.63
Prothrombin Time: 28.6 seconds — ABNORMAL HIGH (ref 11.4–15.2)

## 2016-10-06 MED ORDER — WARFARIN SODIUM 2.5 MG PO TABS
2.5000 mg | ORAL_TABLET | Freq: Once | ORAL | Status: AC
  Administered 2016-10-06: 2.5 mg via ORAL
  Filled 2016-10-06: qty 1

## 2016-10-06 NOTE — Progress Notes (Signed)
ANTICOAGULATION CONSULT NOTE - Follow Up Consult  Pharmacy Consult for COUMADIN (home med) Indication: atrial fibrillation  Allergies  Allergen Reactions  . Imitrex [Sumatriptan] Nausea And Vomiting and Other (See Comments)    "made me like I was having a stoke"  . Lipitor [Atorvastatin] Nausea And Vomiting and Other (See Comments)    INTOLERANCE > MYALGIAS "couldn't get out of the bed ( pt had to crawl out of bed ) I hurt so bad"   Patient Measurements: Height: '5\' 6"'$  (167.6 cm) Weight: 140 lb 6.4 oz (63.7 kg) IBW/kg (Calculated) : 63.8  Vital Signs: Temp: 98.5 F (36.9 C) (04/19 0545) Temp Source: Oral (04/19 0545) BP: 118/68 (04/19 1025) Pulse Rate: 70 (04/19 1025)  Labs:  Recent Labs  10/03/16 1500  10/04/16 0440 10/05/16 0606 10/06/16 0611  HGB 13.7  --  11.6*  --   --   HCT 38.0*  --  32.3*  --   --   PLT 182  --  156  --   --   LABPROT  --   < > 32.0* 35.2* 28.6*  INR  --   < > 3.02 3.41 2.63  CREATININE 1.10  --  1.15 0.92 0.81  < > = values in this interval not displayed. Estimated Creatinine Clearance: 67.7 mL/min (by C-G formula based on SCr of 0.81 mg/dL).  Medications:  Prescriptions Prior to Admission  Medication Sig Dispense Refill Last Dose  . acetaminophen (TYLENOL) 325 MG tablet Take 325-650 mg by mouth every 6 (six) hours as needed for headache (for pain).    10/02/2016 at Unknown time  . ALPRAZolam (XANAX) 0.25 MG tablet Take 1 tablet (0.25 mg total) by mouth 3 (three) times daily as needed for anxiety. 30 tablet 0 Past Month at Unknown time  . bumetanide (BUMEX) 1 MG tablet Take 1 tablet (1 mg total) by mouth daily. 30 tablet 1 10/03/2016 at Unknown time  . gabapentin (NEURONTIN) 100 MG capsule Take 1 capsule (100 mg total) by mouth at bedtime. 30 capsule 1 10/02/2016 at Unknown time  . lansoprazole (PREVACID) 15 MG capsule Take 1 capsule (15 mg total) by mouth daily. 30 capsule 5 10/03/2016 at Unknown time  . levothyroxine (SYNTHROID, LEVOTHROID) 112  MCG tablet Take 1 tablet (112 mcg total) by mouth daily. 30 tablet 5 10/03/2016 at Unknown time  . linaclotide (LINZESS) 145 MCG CAPS capsule Take 1 capsule (145 mcg total) by mouth daily before breakfast. 30 capsule 1 10/03/2016 at Unknown time  . metoprolol (LOPRESSOR) 25 MG tablet Take 0.5 tablets (12.5 mg total) by mouth 2 (two) times daily. 60 tablet 0 10/03/2016 at 830a  . niacin (SLO-NIACIN) 500 MG tablet Take 3 tablets (1,500 mg total) by mouth at bedtime. 30 tablet 0 10/02/2016 at Unknown time  . nitroGLYCERIN (NITROSTAT) 0.4 MG SL tablet Place 1 tablet (0.4 mg total) under the tongue every 5 (five) minutes as needed for chest pain. 25 tablet 10 unknown  . ondansetron (ZOFRAN) 8 MG tablet Take 1 tablet (8 mg total) by mouth every 8 (eight) hours as needed for nausea or vomiting. 20 tablet 0 unknown  . potassium chloride SA (K-DUR,KLOR-CON) 20 MEQ tablet Take 1 tablet (20 mEq total) by mouth daily. 30 tablet 0 10/03/2016 at Unknown time  . rosuvastatin (CRESTOR) 20 MG tablet Take 1 tablet (20 mg total) by mouth daily. 30 tablet 5 Past Week at Unknown time  . tamsulosin (FLOMAX) 0.4 MG CAPS capsule Take 1 capsule (0.4 mg total) by  mouth daily after breakfast. 30 capsule 1 10/03/2016 at Unknown time  . triamterene-hydrochlorothiazide (MAXZIDE-25) 37.5-25 MG tablet Take 1 tablet by mouth every morning. 30 tablet 5 10/03/2016 at Unknown time  . warfarin (COUMADIN) 5 MG tablet 1-1/2 tablet every day except 2 tablets Tuesday Thursday Saturday (Patient taking differently: 1 tablet every day except 1-1/2 tablets Tuesday Thursday Saturday) 90 tablet 1 10/02/2016 at 2000  . senna-docusate (SENOKOT-S) 8.6-50 MG tablet Take 2 tablets by mouth 2 (two) times daily. (Patient not taking: Reported on 10/03/2016)   Not Taking at Unknown time    Assessment: 76yo male on chronic Coumadin for h/o afib.  Today INR is therapeutic.  Pt also on Levaquin which can interact with warfarin to increase INR.  Goal of Therapy:   INR 2-3 Monitor platelets by anticoagulation protocol: Yes   Plan:  Coumadin 2.'5mg'$  po today x 1 INR daily Monitor for s/sx bleeding complications  Hart Robinsons, PharmD Clinical Pharmacist Pager:  [phone] 10/06/2016   10/06/2016,10:28 AM

## 2016-10-06 NOTE — Progress Notes (Signed)
PROGRESS NOTE    [name] [name]  BPZ:[id number] DOB: 1940/09/28 DOA: 10/03/2016 PCP: Mickie Hillier, MD    Brief Narrative: 76 year old male with history of lung cancer with metastatic disease to brain, COPD, atrial fibrillation, presented with headache, fever and dark urine. He was recently hospitalized and found to have urinary retention. He's had a chronic Foley catheter in place for about a month now. He has had approximately 3 UTIs in the past month. Admitted for urinary tract infection and hyponatremia. Currently on antibiotics with urine culture in process. Plans are for discharge home on antibiotics with outpatient urology follow-up.   Assessment & Plan:   Principal Problem:   Urinary tract infection Active Problems:   Hypothyroidism   Essential hypertension   ATRIAL FIBRILLATION   Impaired fasting glucose   Coronary artery disease involving coronary bypass graft of native heart without angina pectoris   Hyperlipidemia   Hypokalemia   Hyponatremia   Adenocarcinoma of right lung, stage 3 (HCC)   Malnutrition of moderate degree   1. Hypovolemic hyponatremia. Acute on chronic. Holding Dyazide. He did receive Iv fluids. Slowly improving.  2. Urinary tract infection, catheter associated, catheter present on admission. He has had a Foley catheter in place since his discharge from the hospital. He will need outpatient follow-up with urology. Urine culture positive for pseudomonas. Since previous culture did show some antibiotic resistance, will continue with current treatments in the hospital until sensitivities are available  3. Urinary retention. Has failed several voiding trials per wife. Continue Foley catheter. Renal ultrasound done today felt very unremarkable. He had a CT scan of abdomen and pelvis done the beginning of March that showed mild prostate enlargement otherwise no urinary abnormalities. Discussed with Dr. Noah Delaine on call for urology who recommended outpatient  follow-up with urology. He also recommended starting the patient on daily trimethoprim once his current antibiotic course has been completed. Follow up with urology has been scheduled for 4/25.  4. Lung cancer with metastatic disease to the brain. Continue follow-up with oncology as previously scheduled.  5. History of atrial fibrillation. Continue on warfarin.  6. Recent bilateral lower extremity DVTs. He is anticoagulated with warfarin. This is likely related to immobilization from recent fall and sacral fractures.  7. COPD. Stable   DVT prophylaxis: Coumadin Code Status: Full code Family Communication: Discussed with wife at the bedside Disposition Plan: Discharge home once improved   Consultants:     Procedures:     Antimicrobials:   Levaquin 4/17>>   Subjective: No new complaints. Feeling better.  Objective: Vitals:   10/05/16 2011 10/05/16 2034 10/06/16 0545 10/06/16 1025  BP:  (!) 122/46 (!) 116/51 118/68  Pulse:  67 75 70  Resp:  18 18   Temp:  97.9 F (36.6 C) 98.5 F (36.9 C)   TempSrc:  Oral Oral   SpO2: 96% 99% 98%   Weight:      Height:        Intake/Output Summary (Last 24 hours) at 10/06/16 1600 Last data filed at 10/06/16 0900  Gross per 24 hour  Intake             3980 ml  Output              250 ml  Net             3730 ml   Filed Weights   10/03/16 1425 10/03/16 1935  Weight: 64 kg (141 lb) 63.7 kg (140 lb 6.4 oz)  Examination:  General exam: Alert, awake, oriented x 3 Respiratory system: Clear to auscultation. Respiratory effort normal. Cardiovascular system:RRR. No murmurs, rubs, gallops. Gastrointestinal system: Abdomen is nondistended, soft and nontender. No organomegaly or masses felt. Normal bowel sounds heard. Central nervous system: Alert and oriented. No focal neurological deficits. Extremities: No C/C/E, +pedal pulses Skin: No rashes, lesions or ulcers Psychiatry: Judgement and insight appear normal. Mood & affect  appropriate.       Data Reviewed: I have personally reviewed following labs and imaging studies  CBC:  Recent Labs Lab 10/03/16 1500 10/04/16 0440  WBC 20.5* 15.8*  NEUTROABS 18.8* 14.2*  HGB 13.7 11.6*  HCT 38.0* 32.3*  MCV 84.8 85.4  PLT 182 629   Basic Metabolic Panel:  Recent Labs Lab 10/03/16 1500 10/04/16 0440 10/05/16 0606 10/06/16 0611  NA 125* 126* 128* 129*  K 3.3* 3.3* 4.2 4.6  CL 84* 92* 97* 100*  CO2 28 25 21* 22  GLUCOSE 126* 128* 121* 100*  BUN 26* 30* 26* 18  CREATININE 1.10 1.15 0.92 0.81  CALCIUM 9.2 8.6* 8.2* 8.2*  MG 1.7  --   --   --   PHOS 3.0  --   --   --    GFR: Estimated Creatinine Clearance: 67.7 mL/min (by C-G formula based on SCr of 0.81 mg/dL). Liver Function Tests:  Recent Labs Lab 10/03/16 1500  AST 34  ALT 22  ALKPHOS 98  BILITOT 0.9  PROT 7.6  ALBUMIN 3.7   No results for input(s): LIPASE, AMYLASE in the last 168 hours. No results for input(s): AMMONIA in the last 168 hours. Coagulation Profile:  Recent Labs Lab 10/03/16 1657 10/04/16 0440 10/05/16 0606 10/06/16 0611  INR 2.69 3.02 3.41 2.63   Cardiac Enzymes: No results for input(s): CKTOTAL, CKMB, CKMBINDEX, TROPONINI in the last 168 hours. BNP (last 3 results) No results for input(s): PROBNP in the last 8760 hours. HbA1C: No results for input(s): HGBA1C in the last 72 hours. CBG:  Recent Labs Lab 10/05/16 1114 10/05/16 1620 10/05/16 2033 10/06/16 0739 10/06/16 1115  GLUCAP 152* 174* 171* 95 129*   Lipid Profile: No results for input(s): CHOL, HDL, LDLCALC, TRIG, CHOLHDL, LDLDIRECT in the last 72 hours. Thyroid Function Tests: No results for input(s): TSH, T4TOTAL, FREET4, T3FREE, THYROIDAB in the last 72 hours. Anemia Panel: No results for input(s): VITAMINB12, FOLATE, FERRITIN, TIBC, IRON, RETICCTPCT in the last 72 hours. Sepsis Labs: No results for input(s): PROCALCITON, LATICACIDVEN in the last 168 hours.  Recent Results (from the past  240 hour(s))  Culture, blood (routine x 2)     Status: None (Preliminary result)   Collection Time: 10/03/16  3:04 PM  Result Value Ref Range Status   Specimen Description RIGHT ANTECUBITAL  Final   Special Requests   Final    BOTTLES DRAWN AEROBIC AND ANAEROBIC Blood Culture adequate volume   Culture NO GROWTH 3 DAYS  Final   Report Status PENDING  Incomplete  Culture, blood (routine x 2)     Status: None (Preliminary result)   Collection Time: 10/03/16  3:12 PM  Result Value Ref Range Status   Specimen Description LEFT ANTECUBITAL  Final   Special Requests   Final    BOTTLES DRAWN AEROBIC AND ANAEROBIC Blood Culture adequate volume   Culture NO GROWTH 3 DAYS  Final   Report Status PENDING  Incomplete  Urine culture     Status: Abnormal (Preliminary result)   Collection Time: 10/03/16  3:15 PM  Result Value Ref Range Status   Specimen Description URINE, CLEAN CATCH  Final   Special Requests NONE  Final   Culture (A)  Final    >=100,000 COLONIES/mL PSEUDOMONAS AERUGINOSA SUSCEPTIBILITIES TO FOLLOW Performed at [facility], [address] [place], [place] [address]    Report Status PENDING  Incomplete         Radiology Studies: US Renal  Result Date: 10/05/2016 CLINICAL DATA:  UTI. EXAM: RENAL / URINARY TRACT ULTRASOUND COMPLETE COMPARISON:  PET-CT 09/29/2016. FINDINGS: Right Kidney: Length: 12.5 cm. Echogenicity within normal limits. No mass or hydronephrosis visualized. Left Kidney: Length: 12.3 cm. Echogenicity within normal limits. No mass or hydronephrosis visualized. Bladder: Foley catheter in the bladder.  Bladder nondistended. IMPRESSION: No acute or focal abnormality identified. No evidence of hydronephrosis. Foley catheter noted in the bladder. Bladder nondistended. Electronically Signed   By: Marcello Moores  Register   On: 10/05/2016 14:26        Scheduled Meds: . feeding supplement (ENSURE ENLIVE)  237 mL Oral BID BM  . gabapentin  100 mg Oral QHS  .  levothyroxine  112 mcg Oral QAC breakfast  . linaclotide  145 mcg Oral QAC breakfast  . metoprolol tartrate  12.5 mg Oral BID  . niacin  1,500 mg Oral QHS  . pantoprazole  40 mg Oral Daily  . potassium chloride SA  20 mEq Oral Daily  . rosuvastatin  20 mg Oral Daily  . tamsulosin  0.4 mg Oral QPC breakfast  . warfarin  2.5 mg Oral Once  . Warfarin - Pharmacist Dosing Inpatient   Does not apply q1800   Continuous Infusions: . levofloxacin (LEVAQUIN) IV Stopped (10/05/16 1832)  . sodium chloride       LOS: 3 days    Time spent: 108mns    MEMON,JEHANZEB, MD [facility] Pager [phone] If 7PM-7AM, please contact night-coverage [email or web address] Password [email or web address] 10/06/2016, 4:00 PM

## 2016-10-07 LAB — URINE CULTURE

## 2016-10-07 LAB — GLUCOSE, CAPILLARY
Glucose-Capillary: 91 mg/dL (ref 65–99)
Glucose-Capillary: 105 mg/dL — ABNORMAL HIGH (ref 65–99)

## 2016-10-07 LAB — PROTIME-INR
INR: 2.31
PROTHROMBIN TIME: 25.8 s — AB (ref 11.4–15.2)

## 2016-10-07 MED ORDER — CIPROFLOXACIN HCL 250 MG PO TABS: 250.0000 mg | ORAL_TABLET | Freq: Two times a day (BID) | ORAL | 0 refills | Status: DC

## 2016-10-07 MED ORDER — DEXTROSE 5 % IV SOLN
190.0000 mg | Freq: Once | INTRAVENOUS | Status: AC
  Administered 2016-10-07: 190 mg via INTRAVENOUS
  Filled 2016-10-07: qty 4.75

## 2016-10-07 MED ORDER — WARFARIN SODIUM 2.5 MG PO TABS
2.5000 mg | ORAL_TABLET | Freq: Once | ORAL | Status: AC
  Administered 2016-10-07: 2.5 mg via ORAL
  Filled 2016-10-07: qty 1

## 2016-10-07 MED ORDER — BUMETANIDE 1 MG PO TABS: 1.0000 mg | ORAL_TABLET | Freq: Every day | ORAL | 1 refills | Status: DC | PRN

## 2016-10-07 NOTE — Progress Notes (Signed)
ANTICOAGULATION CONSULT NOTE - Follow Up Consult  Pharmacy Consult for COUMADIN (home med) Indication: atrial fibrillation  Allergies  Allergen Reactions  . Imitrex [Sumatriptan] Nausea And Vomiting and Other (See Comments)    "made me like I was having a stoke"  . Lipitor [Atorvastatin] Nausea And Vomiting and Other (See Comments)    INTOLERANCE > MYALGIAS "couldn't get out of the bed ( pt had to crawl out of bed ) I hurt so bad"   Patient Measurements: Height: '5\' 6"'$  (167.6 cm) Weight: 140 lb 6.4 oz (63.7 kg) IBW/kg (Calculated) : 63.8  Vital Signs: Temp: 97.7 F (36.5 C) (04/20 0335) Temp Source: Oral (04/20 0335) BP: 125/52 (04/20 0335) Pulse Rate: 58 (04/20 0335)  Labs:  Recent Labs  10/05/16 0606 10/06/16 0611 10/07/16 0455  LABPROT 35.2* 28.6* 25.8*  INR 3.41 2.63 2.31  CREATININE 0.92 0.81  --    Estimated Creatinine Clearance: 67.7 mL/min (by C-G formula based on SCr of 0.81 mg/dL).  Medications:  Prescriptions Prior to Admission  Medication Sig Dispense Refill Last Dose  . acetaminophen (TYLENOL) 325 MG tablet Take 325-650 mg by mouth every 6 (six) hours as needed for headache (for pain).    10/02/2016 at Unknown time  . ALPRAZolam (XANAX) 0.25 MG tablet Take 1 tablet (0.25 mg total) by mouth 3 (three) times daily as needed for anxiety. 30 tablet 0 Past Month at Unknown time  . bumetanide (BUMEX) 1 MG tablet Take 1 tablet (1 mg total) by mouth daily. 30 tablet 1 10/03/2016 at Unknown time  . gabapentin (NEURONTIN) 100 MG capsule Take 1 capsule (100 mg total) by mouth at bedtime. 30 capsule 1 10/02/2016 at Unknown time  . lansoprazole (PREVACID) 15 MG capsule Take 1 capsule (15 mg total) by mouth daily. 30 capsule 5 10/03/2016 at Unknown time  . levothyroxine (SYNTHROID, LEVOTHROID) 112 MCG tablet Take 1 tablet (112 mcg total) by mouth daily. 30 tablet 5 10/03/2016 at Unknown time  . linaclotide (LINZESS) 145 MCG CAPS capsule Take 1 capsule (145 mcg total) by mouth  daily before breakfast. 30 capsule 1 10/03/2016 at Unknown time  . metoprolol (LOPRESSOR) 25 MG tablet Take 0.5 tablets (12.5 mg total) by mouth 2 (two) times daily. 60 tablet 0 10/03/2016 at 830a  . niacin (SLO-NIACIN) 500 MG tablet Take 3 tablets (1,500 mg total) by mouth at bedtime. 30 tablet 0 10/02/2016 at Unknown time  . nitroGLYCERIN (NITROSTAT) 0.4 MG SL tablet Place 1 tablet (0.4 mg total) under the tongue every 5 (five) minutes as needed for chest pain. 25 tablet 10 unknown  . ondansetron (ZOFRAN) 8 MG tablet Take 1 tablet (8 mg total) by mouth every 8 (eight) hours as needed for nausea or vomiting. 20 tablet 0 unknown  . potassium chloride SA (K-DUR,KLOR-CON) 20 MEQ tablet Take 1 tablet (20 mEq total) by mouth daily. 30 tablet 0 10/03/2016 at Unknown time  . rosuvastatin (CRESTOR) 20 MG tablet Take 1 tablet (20 mg total) by mouth daily. 30 tablet 5 Past Week at Unknown time  . tamsulosin (FLOMAX) 0.4 MG CAPS capsule Take 1 capsule (0.4 mg total) by mouth daily after breakfast. 30 capsule 1 10/03/2016 at Unknown time  . triamterene-hydrochlorothiazide (MAXZIDE-25) 37.5-25 MG tablet Take 1 tablet by mouth every morning. 30 tablet 5 10/03/2016 at Unknown time  . warfarin (COUMADIN) 5 MG tablet 1-1/2 tablet every day except 2 tablets Tuesday Thursday Saturday (Patient taking differently: 1 tablet every day except 1-1/2 tablets Tuesday Thursday Saturday) 90 tablet  1 10/02/2016 at 2000  . senna-docusate (SENOKOT-S) 8.6-50 MG tablet Take 2 tablets by mouth 2 (two) times daily. (Patient not taking: Reported on 10/03/2016)   Not Taking at Unknown time   Assessment: 76yo male on chronic Coumadin for h/o afib.  Today INR is therapeutic.  Pt also on Levaquin which can interact with warfarin to increase INR.  Goal of Therapy:  INR 2-3 Monitor platelets by anticoagulation protocol: Yes   Plan:  Coumadin 2.'5mg'$  po today x 1 INR daily Monitor for s/sx bleeding complications  Hart Robinsons, PharmD Clinical  Pharmacist Pager:  [phone] 10/07/2016   10/07/2016,8:21 AM

## 2016-10-07 NOTE — Plan of Care (Signed)
Problem: Physical Regulation: Goal: Will remain free from infection Outcome: Progressing See urine results.

## 2016-10-07 NOTE — Discharge Summary (Addendum)
Physician Discharge Summary  [name] [name] ZWC:[id number] DOB: 20-Oct-1940 DOA: 10/03/2016  PCP: Mickie Hillier, MD  Admit date: 10/03/2016 Discharge date: 10/07/2016  Admitted From: home Disposition:  home  Recommendations for Outpatient Follow-up:  1. Follow up with PCP in 1-2 weeks 2. Please obtain BMP/CBC in one week 3. Repeat inr in 3 days 4. Follow up with urology On 4/25  5. Patient will be discharged with indwelling foley catheter 6. Follow up with oncology as previously scheduled  Home Health: Equipment/Devices:  Discharge Condition: stable CODE STATUS: full code Diet recommendation: Heart Healthy   Brief/Interim Summary: 76 year old male with history of lung cancer with metastatic disease to brain, COPD, atrial fibrillation, presented with headache, fever and dark urine. He was recently hospitalized and found to have urinary retention. He's had a chronic Foley catheter in place for about a month now. He has had approximately 3 UTIs in the past month. Admitted for urinary tract infection and hyponatremia.   Discharge Diagnoses:  Principal Problem:   Urinary tract infection Active Problems:   Hypothyroidism   Essential hypertension   ATRIAL FIBRILLATION   Impaired fasting glucose   Coronary artery disease involving coronary bypass graft of native heart without angina pectoris   Hyperlipidemia   Hypokalemia   Hyponatremia   Adenocarcinoma of right lung, stage 3 (HCC)   Malnutrition of moderate degree  1. Hypovolemic hyponatremia. Acute on chronic. Triamterene/thiazide was held on admission.[name] He did receive Iv fluids. Slowly improving. Asymptomatic.  2. Urinary tract infection, catheter associated, catheter present on admission. He has had a Foley catheter in place since his last discharge from the hospital. He is scheduled to follow-up with urology on 4/25. Urine culture positive for pseudomonas. Sensitivities indicated intermediate resistance to ciprofloxacin. Only  antibiotic that bacteria was sensitive to gentamicin. Case was reviewed with Dr. Linus Salmons, on-call for infectious disease. Recommendations are for patient to receive one dose of gentamicin in the hospital and have Foley catheter change prior to discharge. This patient has clinically improved with fluoroquinolones in the hospital, he has been advised to continue on ciprofloxacin until he follows up with urology next week.  3. Urinary retention. Has failed several voiding trials per wife. Continue Foley catheter. Renal ultrasound did not show any signs of stones or hydronephrosis. He had a CT scan of abdomen and pelvis done in the beginning of March that showed mild prostate enlargement otherwise no urinary abnormalities. Discussed with Dr. Noah Delaine on call for urology who recommended outpatient follow-up with urology.  Follow up with urology has been scheduled for 4/25.  4. Lung cancer with metastatic disease to the brain. Continue follow-up with oncology as previously scheduled.  5. History of atrial fibrillation. Continue on warfarin.  6. Recent bilateral lower extremity DVTs. He is anticoagulated with warfarin. This is likely related to immobilization from recent fall and sacral fractures.  7. COPD. Stable  8. Moderate malnutrition. Nutrition was following  Discharge Instructions  Discharge Instructions    Diet - low sodium heart healthy    Complete by:  As directed    Increase activity slowly    Complete by:  As directed      Allergies as of 10/07/2016      Reactions   Imitrex [sumatriptan] Nausea And Vomiting, Other (See Comments)   "made me like I was having a stoke"   Lipitor [atorvastatin] Nausea And Vomiting, Other (See Comments)   INTOLERANCE > MYALGIAS "couldn't get out of the bed ( pt had to crawl out of  bed ) I hurt so bad"      Medication List    STOP taking these medications   senna-docusate 8.6-50 MG tablet Commonly known as:  Senokot-S    triamterene-hydrochlorothiazide 37.5-25 MG tablet Commonly known as:  MAXZIDE-25     TAKE these medications   acetaminophen 325 MG tablet Commonly known as:  TYLENOL Take 325-650 mg by mouth every 6 (six) hours as needed for headache (for pain).   ALPRAZolam 0.25 MG tablet Commonly known as:  XANAX Take 1 tablet (0.25 mg total) by mouth 3 (three) times daily as needed for anxiety.   bumetanide 1 MG tablet Commonly known as:  BUMEX Take 1 tablet (1 mg total) by mouth daily as needed (swelling). What changed:  when to take this  reasons to take this   ciprofloxacin 250 MG tablet Commonly known as:  CIPRO Take 1 tablet (250 mg total) by mouth 2 (two) times daily.   gabapentin 100 MG capsule Commonly known as:  NEURONTIN Take 1 capsule (100 mg total) by mouth at bedtime.   lansoprazole 15 MG capsule Commonly known as:  PREVACID Take 1 capsule (15 mg total) by mouth daily.   levothyroxine 112 MCG tablet Commonly known as:  SYNTHROID, LEVOTHROID Take 1 tablet (112 mcg total) by mouth daily.   linaclotide 145 MCG Caps capsule Commonly known as:  LINZESS Take 1 capsule (145 mcg total) by mouth daily before breakfast.   metoprolol tartrate 25 MG tablet Commonly known as:  LOPRESSOR Take 0.5 tablets (12.5 mg total) by mouth 2 (two) times daily.   niacin 500 MG tablet Commonly known as:  SLO-NIACIN Take 3 tablets (1,500 mg total) by mouth at bedtime.   nitroGLYCERIN 0.4 MG SL tablet Commonly known as:  NITROSTAT Place 1 tablet (0.4 mg total) under the tongue every 5 (five) minutes as needed for chest pain.   ondansetron 8 MG tablet Commonly known as:  ZOFRAN Take 1 tablet (8 mg total) by mouth every 8 (eight) hours as needed for nausea or vomiting.   potassium chloride SA 20 MEQ tablet Commonly known as:  K-DUR,KLOR-CON Take 1 tablet (20 mEq total) by mouth daily.   rosuvastatin 20 MG tablet Commonly known as:  CRESTOR Take 1 tablet (20 mg total) by mouth  daily.   tamsulosin 0.4 MG Caps capsule Commonly known as:  FLOMAX Take 1 capsule (0.4 mg total) by mouth daily after breakfast.   warfarin 5 MG tablet Commonly known as:  COUMADIN 1-1/2 tablet every day except 2 tablets Tuesday Thursday Saturday What changed:  additional instructions      Follow-up Information    Jed Limerick, NP On 10/12/2016.   Specialty:  Urology Why:  9:45am Contact information: [place] [address] [phone]        Eilleen Kempf., MD Follow up.   Specialty:  Oncology Why:  as previously scheduled Contact information: [place] [address] [phone]        Mickie Hillier, MD. Schedule an appointment as soon as possible for a visit on 10/20/2016.   Specialty:  Family Medicine Why:  3:00 Contact information: [place] [address] [phone]          Allergies  Allergen Reactions  . Imitrex [Sumatriptan] Nausea And Vomiting and Other (See Comments)    "made me like I was having a stoke"  . Lipitor [Atorvastatin] Nausea And Vomiting and Other (See Comments)  INTOLERANCE > MYALGIAS "couldn't get out of the bed ( pt had to crawl out of bed ) I hurt so bad"    Consultations:  Urology (phone consult)  Infectious disease (phone consult)   Procedures/Studies: Dg Chest 2 View  Result Date: 10/03/2016 CLINICAL DATA:  Fever, hypertension, former smoker, atherosclerosis, known right middle lobe 2 cm PET positive nodule. EXAM: CHEST  2 VIEW COMPARISON:  09/08/2016, 09/29/2016 FINDINGS: Previous coronary bypass changes and aortic arch vascular stent. Stable mild cardiac enlargement with normal vascularity. Anterior right middle lobe 2 cm spiculated nodule overlies the right infrahilar vasculature and is obscured by plain radiography. Right hilar and subcarinal adenopathy by PET-CT not confidently demonstrated by plain radiography. No superimposed significant  pneumonia, collapse, consolidation, or edema. No effusion or pneumothorax. Trachea is midline. Atherosclerosis noted of the aorta. Degenerative changes of the spine. IMPRESSION: No superimposed acute chest process. Cardiomegaly without CHF Thoracic aortic atherosclerosis Anterior 2 cm right middle lobe nodule, right hilar and subcarinal adenopathy demonstrated by PET-CT not well appreciated by plain radiography. Please refer to the PET-CT of 4 days ago. Electronically Signed   By: Jerilynn Mages.  Shick M.D.   On: 10/03/2016 15:59   Dg Chest 2 View  Result Date: 09/08/2016 CLINICAL DATA:  Preoperative evaluation.  Hypertension. EXAM: CHEST  2 VIEW COMPARISON:  September 06, 2016 FINDINGS: There are scattered areas of mild scarring. There is no edema or consolidation. Heart is borderline enlarged with pulmonary vascularity within normal limits. Patient is status post coronary artery bypass grafting. There is a left subclavian stent. No adenopathy. There is degenerative change in the thoracic spine. There is aortic atherosclerosis. IMPRESSION: Areas of mild scarring. No edema or consolidation. Heart borderline enlarged. Aortic atherosclerosis. Electronically Signed   By: Lowella Grip III M.D.   On: 09/08/2016 19:44   US Renal  Result Date: 10/05/2016 CLINICAL DATA:  UTI. EXAM: RENAL / URINARY TRACT ULTRASOUND COMPLETE COMPARISON:  PET-CT 09/29/2016. FINDINGS: Right Kidney: Length: 12.5 cm. Echogenicity within normal limits. No mass or hydronephrosis visualized. Left Kidney: Length: 12.3 cm. Echogenicity within normal limits. No mass or hydronephrosis visualized. Bladder: Foley catheter in the bladder.  Bladder nondistended. IMPRESSION: No acute or focal abnormality identified. No evidence of hydronephrosis. Foley catheter noted in the bladder. Bladder nondistended. Electronically Signed   By: Marcello Moores  Register   On: 10/05/2016 14:26   Nm Pet Image Initial (pi) Skull Base To Thigh  Result Date: 09/29/2016 CLINICAL DATA:   Initial treatment strategy for right lung cancer. EXAM: NUCLEAR MEDICINE PET SKULL BASE TO THIGH TECHNIQUE: 7.0 mCi F-18 FDG was injected intravenously. Full-ring PET imaging was performed from the skull base to thigh after the radiotracer. CT data was obtained and used for attenuation correction and anatomic localization. FASTING BLOOD GLUCOSE:  Value: 139 mg/dl COMPARISON:  CT scans from 08/22/2016 FINDINGS: NECK No hypermetabolic lymph nodes in the neck. Mild chronic right anterior ethmoid sinusitis. Carotid atherosclerotic calcification. CHEST The spiculated 2.0 cm right middle lobe nodule has a maximum standard uptake value of 11.7. 1.8 cm in short axis subcarinal node has a maximum SUV of 14.8. Right hilar node measuring approximately 8 mm in short axis on image 74/4 has a maximum SUV of 10.3. The tiny nodules at the right lung apex are not discernibly hypermetabolic but are below sensitive PET-CT size thresholds. The previous ground-glass opacity posteriorly in the left upper lobe is only faintly apparent on image 25 of series 8 of today' s exam and is not visibly hypermetabolic.  Coronary, aortic arch, and branch vessel atherosclerotic vascular disease. Prior CABG. ABDOMEN/PELVIS Activity along the postsurgical sites in the pelvis is felt to be inflammatory. Foley catheter noted. Faint activity along the left lumbar transverse process sites and also along the severely degenerated left L5-S1 facet joint. No findings of metastatic disease to the abdomen or pelvis. SKELETON No focal hypermetabolic activity to suggest skeletal metastasis. Recent ORIF of pelvic fractures. Left lumbar transverse process fractures as shown on prior CT. Ventral supraumbilical hernia contains omental adipose tissue. IMPRESSION: 1. Hypermetabolic 2.0 cm spiculated right middle lobe nodule, SUV 11.7, compatible with malignancy. Hypermetabolic subcarinal and right hilar adenopathy. Assuming non-small cell lung cancer, this represents  T1b N2 M0 disease (stage IIIA). 2. Other imaging findings of potential clinical significance: Recent ORIF of pelvic fractures. Coronary, aortic arch, and branch vessel atherosclerotic vascular disease. Aortoiliac atherosclerotic vascular disease. Mild chronic right anterior ethmoid sinusitis. The tiny nodules at the right lung apex and ground-glass nodule in the left upper lobe posteriorly are not visibly hypermetabolic but are below sensitive PET-CT size thresholds, and may merit surveillance. Electronically Signed   By: Van Clines M.D.   On: 09/29/2016 16:18       Subjective: No new complaints. Feels good. Wants to go home  Discharge Exam: Vitals:   10/07/16 0335 10/07/16 1300  BP: (!) 125/52 (!) 121/55  Pulse: (!) 58 (!) 58  Resp: 18 19  Temp: 97.7 F (36.5 C) 97.7 F (36.5 C)   Vitals:   10/06/16 2200 10/06/16 2221 10/07/16 0335 10/07/16 1300  BP: (!) 140/59 (!) 140/59 (!) 125/52 (!) 121/55  Pulse: 64 64 (!) 58 (!) 58  Resp: '18  18 19  '$ Temp: 97.5 F (36.4 C)  97.7 F (36.5 C) 97.7 F (36.5 C)  TempSrc: Oral  Oral Oral  SpO2: 96%  97% 100%  Weight:      Height:        General: Pt is alert, awake, not in acute distress Cardiovascular: RRR, S1/S2 +, no rubs, no gallops Respiratory: CTA bilaterally, no wheezing, no rhonchi Abdominal: Soft, NT, ND, bowel sounds + Extremities: no edema, no cyanosis    The results of significant diagnostics from this hospitalization (including imaging, microbiology, ancillary and laboratory) are listed below for reference.     Microbiology: Recent Results (from the past 240 hour(s))  Culture, blood (routine x 2)     Status: None (Preliminary result)   Collection Time: 10/03/16  3:04 PM  Result Value Ref Range Status   Specimen Description RIGHT ANTECUBITAL  Final   Special Requests   Final    BOTTLES DRAWN AEROBIC AND ANAEROBIC Blood Culture adequate volume   Culture NO GROWTH 4 DAYS  Final   Report Status PENDING   Incomplete  Culture, blood (routine x 2)     Status: None (Preliminary result)   Collection Time: 10/03/16  3:12 PM  Result Value Ref Range Status   Specimen Description LEFT ANTECUBITAL  Final   Special Requests   Final    BOTTLES DRAWN AEROBIC AND ANAEROBIC Blood Culture adequate volume   Culture NO GROWTH 4 DAYS  Final   Report Status PENDING  Incomplete  Urine culture     Status: Abnormal   Collection Time: 10/03/16  3:15 PM  Result Value Ref Range Status   Specimen Description URINE, CLEAN CATCH  Final   Special Requests NONE  Final   Culture >=100,000 COLONIES/mL PSEUDOMONAS AERUGINOSA (A)  Final   Report Status 10/07/2016 FINAL  Final   Organism ID, Bacteria PSEUDOMONAS AERUGINOSA (A)  Final      Susceptibility   Pseudomonas aeruginosa - MIC*    CEFTAZIDIME 32 RESISTANT Resistant     CIPROFLOXACIN 2 INTERMEDIATE Intermediate     GENTAMICIN <=1 SENSITIVE Sensitive     IMIPENEM >=16 RESISTANT Resistant     CEFEPIME INTERMEDIATE Intermediate     * >=100,000 COLONIES/mL PSEUDOMONAS AERUGINOSA     Labs: BNP (last 3 results) No results for input(s): BNP in the last 8760 hours. Basic Metabolic Panel:  Recent Labs Lab 10/03/16 1500 10/04/16 0440 10/05/16 0606 10/06/16 0611  NA 125* 126* 128* 129*  K 3.3* 3.3* 4.2 4.6  CL 84* 92* 97* 100*  CO2 28 25 21* 22  GLUCOSE 126* 128* 121* 100*  BUN 26* 30* 26* 18  CREATININE 1.10 1.15 0.92 0.81  CALCIUM 9.2 8.6* 8.2* 8.2*  MG 1.7  --   --   --   PHOS 3.0  --   --   --    Liver Function Tests:  Recent Labs Lab 10/03/16 1500  AST 34  ALT 22  ALKPHOS 98  BILITOT 0.9  PROT 7.6  ALBUMIN 3.7   No results for input(s): LIPASE, AMYLASE in the last 168 hours. No results for input(s): AMMONIA in the last 168 hours. CBC:  Recent Labs Lab 10/03/16 1500 10/04/16 0440  WBC 20.5* 15.8*  NEUTROABS 18.8* 14.2*  HGB 13.7 11.6*  HCT 38.0* 32.3*  MCV 84.8 85.4  PLT 182 156   Cardiac Enzymes: No results for input(s):  CKTOTAL, CKMB, CKMBINDEX, TROPONINI in the last 168 hours. BNP: Invalid input(s): POCBNP CBG:  Recent Labs Lab 10/06/16 0739 10/06/16 1115 10/06/16 1604 10/07/16 0825 10/07/16 1104  GLUCAP 95 129* 127* 91 105*   D-Dimer No results for input(s): DDIMER in the last 72 hours. Hgb A1c No results for input(s): HGBA1C in the last 72 hours. Lipid Profile No results for input(s): CHOL, HDL, LDLCALC, TRIG, CHOLHDL, LDLDIRECT in the last 72 hours. Thyroid function studies No results for input(s): TSH, T4TOTAL, T3FREE, THYROIDAB in the last 72 hours.  Invalid input(s): FREET3 Anemia work up No results for input(s): VITAMINB12, FOLATE, FERRITIN, TIBC, IRON, RETICCTPCT in the last 72 hours. Urinalysis    Component Value Date/Time   COLORURINE YELLOW 10/03/2016 1515   APPEARANCEUR CLEAR 10/03/2016 1515   LABSPEC 1.011 10/03/2016 1515   PHURINE 5.0 10/03/2016 1515   GLUCOSEU NEGATIVE 10/03/2016 1515   HGBUR LARGE (A) 10/03/2016 1515   BILIRUBINUR NEGATIVE 10/03/2016 1515   KETONESUR NEGATIVE 10/03/2016 1515   PROTEINUR 100 (A) 10/03/2016 1515   UROBILINOGEN 1.0 07/17/2013 1201   NITRITE POSITIVE (A) 10/03/2016 1515   LEUKOCYTESUR NEGATIVE 10/03/2016 1515   Sepsis Labs Invalid input(s): PROCALCITONIN,  WBC,  LACTICIDVEN Microbiology Recent Results (from the past 240 hour(s))  Culture, blood (routine x 2)     Status: None (Preliminary result)   Collection Time: 10/03/16  3:04 PM  Result Value Ref Range Status   Specimen Description RIGHT ANTECUBITAL  Final   Special Requests   Final    BOTTLES DRAWN AEROBIC AND ANAEROBIC Blood Culture adequate volume   Culture NO GROWTH 4 DAYS  Final   Report Status PENDING  Incomplete  Culture, blood (routine x 2)     Status: None (Preliminary result)   Collection Time: 10/03/16  3:12 PM  Result Value Ref Range Status   Specimen Description LEFT ANTECUBITAL  Final   Special Requests   Final  BOTTLES DRAWN AEROBIC AND ANAEROBIC Blood  Culture adequate volume   Culture NO GROWTH 4 DAYS  Final   Report Status PENDING  Incomplete  Urine culture     Status: Abnormal   Collection Time: 10/03/16  3:15 PM  Result Value Ref Range Status   Specimen Description URINE, CLEAN CATCH  Final   Special Requests NONE  Final   Culture >=100,000 COLONIES/mL PSEUDOMONAS AERUGINOSA (A)  Final   Report Status 10/07/2016 FINAL  Final   Organism ID, Bacteria PSEUDOMONAS AERUGINOSA (A)  Final      Susceptibility   Pseudomonas aeruginosa - MIC*    CEFTAZIDIME 32 RESISTANT Resistant     CIPROFLOXACIN 2 INTERMEDIATE Intermediate     GENTAMICIN <=1 SENSITIVE Sensitive     IMIPENEM >=16 RESISTANT Resistant     CEFEPIME INTERMEDIATE Intermediate     * >=100,000 COLONIES/mL PSEUDOMONAS AERUGINOSA     Time coordinating discharge: Over 30 minutes  SIGNED:   Kathie Dike, MD  [facility] 10/07/2016, 6:52 PM Pager   If 7PM-7AM, please contact night-coverage [email or web address] Password [email or web address]

## 2016-10-07 NOTE — Progress Notes (Signed)
Pharmacy Antibiotic Note/anticoagulation  [name] [name] is a 76 y.o. male admitted on 10/03/2016 with UTI.  Pharmacy has been consulted for GENTAMICIN dosing.  Discussed with Dr's [place] and [place].  Pseudomonas in UCx S to gent noted.   Plan: Gentamicin '190mg'$  ('3mg'$ /Kg) x 1 dose now Other orders per MD  Height: '5\' 6"'$  (167.6 cm) Weight: 140 lb 6.4 oz (63.7 kg) IBW/kg (Calculated) : 63.8  Temp (24hrs), Avg:97.6 F (36.4 C), Min:97.5 F (36.4 C), Max:97.7 F (36.5 C)   Recent Labs Lab 10/03/16 1500 10/04/16 0440 10/05/16 0606 10/06/16 0611  WBC 20.5* 15.8*  --   --   CREATININE 1.10 1.15 0.92 0.81    Estimated Creatinine Clearance: 67.7 mL/min (by C-G formula based on SCr of 0.81 mg/dL).    Allergies  Allergen Reactions  . Imitrex [Sumatriptan] Nausea And Vomiting and Other (See Comments)    "made me like I was having a stoke"  . Lipitor [Atorvastatin] Nausea And Vomiting and Other (See Comments)    INTOLERANCE > MYALGIAS "couldn't get out of the bed ( pt had to crawl out of bed ) I hurt so bad"   Specimen Description URINE, CLEAN CATCH   Special Requests NONE   Culture >=100,000 COLONIES/mL PSEUDOMONAS AERUGINOSA    Report Status 10/07/2016 FINAL   Organism ID, Bacteria PSEUDOMONAS AERUGINOSA    Resulting Agency [facility]  Susceptibility    Pseudomonas aeruginosa    MIC    CEFEPIME INTERMEDIATE  Intermediate    CEFTAZIDIME 32 RESISTANT  Resistant    CIPROFLOXACIN 2 INTERMEDI... Intermediate    GENTAMICIN <=1 SENSITIVE "><=1 SENSITIVE  Sensitive    IMIPENEM >=16 RESIST... Resistant         Susceptibility Comments   Pseudomonas aeruginosa  >=100,000 COLONIES/mL PSEUDOMONAS AERUGINOSA     Thank you for allowing pharmacy to be a part of this patient's care.  Hart Robinsons, PharmD Clinical Pharmacist Pager:  [phone] 10/07/2016   10/07/2016 2:01 PM

## 2016-10-07 NOTE — Progress Notes (Signed)
IV removed, WNL. Marshon Bangs changed out per MD order. D/C instructions given to pt. Verbalized understanding. Pt wife here to take home.

## 2016-10-08 LAB — CULTURE, BLOOD (ROUTINE X 2)
Culture: NO GROWTH
Culture: NO GROWTH
SPECIAL REQUESTS: ADEQUATE
Special Requests: ADEQUATE

## 2016-10-09 DIAGNOSIS — C8521 Mediastinal (thymic) large B-cell lymphoma, lymph nodes of head, face, and neck: Secondary | ICD-10-CM | POA: Diagnosis not present

## 2016-10-09 DIAGNOSIS — I82409 Acute embolism and thrombosis of unspecified deep veins of unspecified lower extremity: Secondary | ICD-10-CM | POA: Diagnosis not present

## 2016-10-09 DIAGNOSIS — S329XXD Fracture of unspecified parts of lumbosacral spine and pelvis, subsequent encounter for fracture with routine healing: Secondary | ICD-10-CM | POA: Diagnosis not present

## 2016-10-09 DIAGNOSIS — N390 Urinary tract infection, site not specified: Secondary | ICD-10-CM | POA: Diagnosis not present

## 2016-10-09 DIAGNOSIS — I4891 Unspecified atrial fibrillation: Secondary | ICD-10-CM | POA: Diagnosis not present

## 2016-10-09 DIAGNOSIS — I739 Peripheral vascular disease, unspecified: Secondary | ICD-10-CM | POA: Diagnosis not present

## 2016-10-10 ENCOUNTER — Telehealth: Payer: Self-pay | Admitting: *Deleted

## 2016-10-10 ENCOUNTER — Telehealth: Payer: Self-pay | Admitting: Radiation Oncology

## 2016-10-10 ENCOUNTER — Telehealth: Payer: Self-pay | Admitting: Family Medicine

## 2016-10-10 ENCOUNTER — Other Ambulatory Visit: Payer: Medicare Other

## 2016-10-10 ENCOUNTER — Ambulatory Visit: Payer: Medicare Other

## 2016-10-10 DIAGNOSIS — I4891 Unspecified atrial fibrillation: Secondary | ICD-10-CM | POA: Diagnosis not present

## 2016-10-10 DIAGNOSIS — C8521 Mediastinal (thymic) large B-cell lymphoma, lymph nodes of head, face, and neck: Secondary | ICD-10-CM | POA: Diagnosis not present

## 2016-10-10 DIAGNOSIS — I739 Peripheral vascular disease, unspecified: Secondary | ICD-10-CM | POA: Diagnosis not present

## 2016-10-10 DIAGNOSIS — S329XXD Fracture of unspecified parts of lumbosacral spine and pelvis, subsequent encounter for fracture with routine healing: Secondary | ICD-10-CM | POA: Diagnosis not present

## 2016-10-10 DIAGNOSIS — I82409 Acute embolism and thrombosis of unspecified deep veins of unspecified lower extremity: Secondary | ICD-10-CM | POA: Diagnosis not present

## 2016-10-10 DIAGNOSIS — N390 Urinary tract infection, site not specified: Secondary | ICD-10-CM | POA: Diagnosis not present

## 2016-10-10 NOTE — Telephone Encounter (Signed)
Oncology Nurse Navigator Documentation  Oncology Nurse Navigator Flowsheets 10/10/2016  Navigator Location [facility]  Navigator Encounter Type Telephone/Ms. Trostle called and would like Rad Onc appt to be sooner.  I called Ms. Yearick and listened as she explained.  I updated her I would contact Rad Onc scheduling to see if Mr. Cansler can be seen sooner.  She was thankful for the help.   Telephone Outgoing Call  Treatment Phase Pre-Tx/Tx Discussion  Barriers/Navigation Needs Coordination of Care  Interventions Coordination of Care  Coordination of Care Appts  Acuity Level 2  Time Spent with Patient 30

## 2016-10-10 NOTE — Telephone Encounter (Signed)
[facility] nurse(Maria) called with results of INR-3.8. [phone]

## 2016-10-10 NOTE — Telephone Encounter (Signed)
Received message from patient's wife, [name], requesting to reschedule her husband's missed consult appointment. Phoned Linda back. Confirmed appointment for MRI on 4/28 and consult on 4/30. She verbalized understanding. Also, she reported he has an appointment with his urologist on Wednesday at 1000 and Dr. Marcelino Scot at [phone].

## 2016-10-10 NOTE — Telephone Encounter (Signed)
Takes '5mg'$  tablets. 2tabs on tues, thurs, and sat. And one and a half tablets all other days. Consult with dr Richardson Landry. Change to 7.'5mg'$  all days except on Thursday take '10mg'$ . Recheck INR in one week.  Discussed with pt's wife [name] and home health nurse Verdis Frederickson.

## 2016-10-10 NOTE — Telephone Encounter (Signed)
Oncology Nurse Navigator Documentation  Oncology Nurse Navigator Flowsheets 10/10/2016  Navigator Location [facility]  Navigator Encounter Type Telephone/Ms. Spratley called and left vm message with Dr. Worthy Flank desk nurse today.  She would like an update on PET scan.  I updated Dr. Julien Nordmann. He states I can call and update her there are no new findings on PET scan from other scans.  I called and left vm message with Ms. Estock.   Telephone Outgoing Call;Incoming Call  Treatment Phase Pre-Tx/Tx Discussion  Barriers/Navigation Needs Education  Education Other  Interventions Education  Education Method Verbal  Acuity Level 2  Time Spent with Patient 30

## 2016-10-11 DIAGNOSIS — I4891 Unspecified atrial fibrillation: Secondary | ICD-10-CM | POA: Diagnosis not present

## 2016-10-11 DIAGNOSIS — I739 Peripheral vascular disease, unspecified: Secondary | ICD-10-CM | POA: Diagnosis not present

## 2016-10-11 DIAGNOSIS — C8521 Mediastinal (thymic) large B-cell lymphoma, lymph nodes of head, face, and neck: Secondary | ICD-10-CM | POA: Diagnosis not present

## 2016-10-11 DIAGNOSIS — N390 Urinary tract infection, site not specified: Secondary | ICD-10-CM | POA: Diagnosis not present

## 2016-10-11 DIAGNOSIS — S329XXD Fracture of unspecified parts of lumbosacral spine and pelvis, subsequent encounter for fracture with routine healing: Secondary | ICD-10-CM | POA: Diagnosis not present

## 2016-10-11 DIAGNOSIS — I82409 Acute embolism and thrombosis of unspecified deep veins of unspecified lower extremity: Secondary | ICD-10-CM | POA: Diagnosis not present

## 2016-10-12 DIAGNOSIS — R338 Other retention of urine: Secondary | ICD-10-CM | POA: Diagnosis not present

## 2016-10-12 DIAGNOSIS — S32811D Multiple fractures of pelvis with unstable disruption of pelvic ring, subsequent encounter for fracture with routine healing: Secondary | ICD-10-CM | POA: Diagnosis not present

## 2016-10-15 ENCOUNTER — Ambulatory Visit (HOSPITAL_COMMUNITY)
Admission: RE | Admit: 2016-10-15 | Discharge: 2016-10-15 | Disposition: A | Discharge status: Home / Self Care | Payer: Medicare Other | Source: Ambulatory Visit | Attending: Radiation Oncology | Admitting: Radiation Oncology

## 2016-10-15 DIAGNOSIS — I639 Cerebral infarction, unspecified: Secondary | ICD-10-CM | POA: Insufficient documentation

## 2016-10-15 DIAGNOSIS — C801 Malignant (primary) neoplasm, unspecified: Secondary | ICD-10-CM | POA: Diagnosis not present

## 2016-10-15 DIAGNOSIS — C3491 Malignant neoplasm of unspecified part of right bronchus or lung: Secondary | ICD-10-CM | POA: Diagnosis not present

## 2016-10-15 DIAGNOSIS — I679 Cerebrovascular disease, unspecified: Secondary | ICD-10-CM | POA: Insufficient documentation

## 2016-10-15 DIAGNOSIS — G319 Degenerative disease of nervous system, unspecified: Secondary | ICD-10-CM | POA: Diagnosis not present

## 2016-10-15 DIAGNOSIS — C7931 Secondary malignant neoplasm of brain: Secondary | ICD-10-CM | POA: Diagnosis not present

## 2016-10-15 MED ORDER — GADOBENATE DIMEGLUMINE 529 MG/ML IV SOLN
15.0000 mL | Freq: Once | INTRAVENOUS | Status: AC | PRN
  Administered 2016-10-15: 15 mL via INTRAVENOUS

## 2016-10-17 ENCOUNTER — Telehealth: Payer: Self-pay | Admitting: Internal Medicine

## 2016-10-17 ENCOUNTER — Ambulatory Visit
Admission: RE | Admit: 2016-10-17 | Discharge: 2016-10-17 | Disposition: A | Discharge status: Home / Self Care | Payer: Medicare Other | Source: Ambulatory Visit | Attending: Radiation Oncology | Admitting: Radiation Oncology

## 2016-10-17 ENCOUNTER — Other Ambulatory Visit: Payer: Medicare Other

## 2016-10-17 ENCOUNTER — Telehealth: Payer: Self-pay

## 2016-10-17 ENCOUNTER — Encounter: Payer: Self-pay | Admitting: Internal Medicine

## 2016-10-17 ENCOUNTER — Encounter: Payer: Self-pay | Admitting: Radiation Oncology

## 2016-10-17 ENCOUNTER — Other Ambulatory Visit (HOSPITAL_BASED_OUTPATIENT_CLINIC_OR_DEPARTMENT_OTHER): Payer: Medicare Other

## 2016-10-17 ENCOUNTER — Ambulatory Visit (HOSPITAL_BASED_OUTPATIENT_CLINIC_OR_DEPARTMENT_OTHER): Payer: Medicare Other | Admitting: Internal Medicine

## 2016-10-17 ENCOUNTER — Ambulatory Visit: Payer: Medicare Other | Admitting: Nutrition

## 2016-10-17 ENCOUNTER — Ambulatory Visit: Payer: Medicare Other

## 2016-10-17 ENCOUNTER — Ambulatory Visit (HOSPITAL_BASED_OUTPATIENT_CLINIC_OR_DEPARTMENT_OTHER): Payer: Medicare Other

## 2016-10-17 VITALS — BP 118/60 | HR 63 | Temp 97.9°F | Resp 20 | Ht 66.0 in

## 2016-10-17 VITALS — BP 123/57 | HR 63 | Temp 98.5°F | Resp 16

## 2016-10-17 VITALS — BP 101/48 | HR 68 | Temp 97.8°F | Resp 18 | Ht 66.0 in

## 2016-10-17 DIAGNOSIS — K219 Gastro-esophageal reflux disease without esophagitis: Secondary | ICD-10-CM | POA: Diagnosis not present

## 2016-10-17 DIAGNOSIS — C771 Secondary and unspecified malignant neoplasm of intrathoracic lymph nodes: Secondary | ICD-10-CM | POA: Diagnosis not present

## 2016-10-17 DIAGNOSIS — C342 Malignant neoplasm of middle lobe, bronchus or lung: Secondary | ICD-10-CM

## 2016-10-17 DIAGNOSIS — R410 Disorientation, unspecified: Secondary | ICD-10-CM

## 2016-10-17 DIAGNOSIS — N189 Chronic kidney disease, unspecified: Secondary | ICD-10-CM | POA: Diagnosis not present

## 2016-10-17 DIAGNOSIS — Z951 Presence of aortocoronary bypass graft: Secondary | ICD-10-CM | POA: Diagnosis not present

## 2016-10-17 DIAGNOSIS — Z801 Family history of malignant neoplasm of trachea, bronchus and lung: Secondary | ICD-10-CM | POA: Diagnosis not present

## 2016-10-17 DIAGNOSIS — Z833 Family history of diabetes mellitus: Secondary | ICD-10-CM | POA: Diagnosis not present

## 2016-10-17 DIAGNOSIS — C7949 Secondary malignant neoplasm of other parts of nervous system: Principal | ICD-10-CM

## 2016-10-17 DIAGNOSIS — C3491 Malignant neoplasm of unspecified part of right bronchus or lung: Secondary | ICD-10-CM

## 2016-10-17 DIAGNOSIS — M810 Age-related osteoporosis without current pathological fracture: Secondary | ICD-10-CM | POA: Diagnosis not present

## 2016-10-17 DIAGNOSIS — Z87442 Personal history of urinary calculi: Secondary | ICD-10-CM | POA: Diagnosis not present

## 2016-10-17 DIAGNOSIS — Z7901 Long term (current) use of anticoagulants: Secondary | ICD-10-CM | POA: Diagnosis not present

## 2016-10-17 DIAGNOSIS — Z5111 Encounter for antineoplastic chemotherapy: Secondary | ICD-10-CM

## 2016-10-17 DIAGNOSIS — Z955 Presence of coronary angioplasty implant and graft: Secondary | ICD-10-CM | POA: Diagnosis not present

## 2016-10-17 DIAGNOSIS — Z87891 Personal history of nicotine dependence: Secondary | ICD-10-CM | POA: Diagnosis not present

## 2016-10-17 DIAGNOSIS — Z8673 Personal history of transient ischemic attack (TIA), and cerebral infarction without residual deficits: Secondary | ICD-10-CM | POA: Diagnosis not present

## 2016-10-17 DIAGNOSIS — E039 Hypothyroidism, unspecified: Secondary | ICD-10-CM | POA: Diagnosis not present

## 2016-10-17 DIAGNOSIS — I129 Hypertensive chronic kidney disease with stage 1 through stage 4 chronic kidney disease, or unspecified chronic kidney disease: Secondary | ICD-10-CM | POA: Diagnosis not present

## 2016-10-17 DIAGNOSIS — G9389 Other specified disorders of brain: Secondary | ICD-10-CM

## 2016-10-17 DIAGNOSIS — C3411 Malignant neoplasm of upper lobe, right bronchus or lung: Secondary | ICD-10-CM

## 2016-10-17 DIAGNOSIS — Z8249 Family history of ischemic heart disease and other diseases of the circulatory system: Secondary | ICD-10-CM | POA: Diagnosis not present

## 2016-10-17 DIAGNOSIS — I252 Old myocardial infarction: Secondary | ICD-10-CM | POA: Diagnosis not present

## 2016-10-17 DIAGNOSIS — M1711 Unilateral primary osteoarthritis, right knee: Secondary | ICD-10-CM | POA: Diagnosis not present

## 2016-10-17 DIAGNOSIS — I2510 Atherosclerotic heart disease of native coronary artery without angina pectoris: Secondary | ICD-10-CM | POA: Diagnosis not present

## 2016-10-17 DIAGNOSIS — I639 Cerebral infarction, unspecified: Secondary | ICD-10-CM | POA: Diagnosis not present

## 2016-10-17 DIAGNOSIS — W1789XA Other fall from one level to another, initial encounter: Secondary | ICD-10-CM

## 2016-10-17 DIAGNOSIS — C7931 Secondary malignant neoplasm of brain: Secondary | ICD-10-CM

## 2016-10-17 DIAGNOSIS — I739 Peripheral vascular disease, unspecified: Secondary | ICD-10-CM | POA: Diagnosis not present

## 2016-10-17 DIAGNOSIS — Z510 Encounter for antineoplastic radiation therapy: Secondary | ICD-10-CM | POA: Diagnosis not present

## 2016-10-17 DIAGNOSIS — Z96653 Presence of artificial knee joint, bilateral: Secondary | ICD-10-CM | POA: Diagnosis not present

## 2016-10-17 HISTORY — DX: Encounter for antineoplastic chemotherapy: Z51.11

## 2016-10-17 LAB — COMPREHENSIVE METABOLIC PANEL
ALT: 23 U/L (ref 0–55)
AST: 23 U/L (ref 5–34)
Albumin: 3 g/dL — ABNORMAL LOW (ref 3.5–5.0)
Alkaline Phosphatase: 106 U/L (ref 40–150)
Anion Gap: 11 mEq/L (ref 3–11)
BUN: 14.4 mg/dL (ref 7.0–26.0)
CHLORIDE: 99 meq/L (ref 98–109)
CO2: 24 meq/L (ref 22–29)
CREATININE: 0.7 mg/dL (ref 0.7–1.3)
Calcium: 9.1 mg/dL (ref 8.4–10.4)
EGFR: 89 mL/min/{1.73_m2} — ABNORMAL LOW (ref >90)
Glucose: 128 mg/dl (ref 70–140)
Potassium: 3.8 mEq/L (ref 3.5–5.1)
Sodium: 133 mEq/L — ABNORMAL LOW (ref 136–145)
Total Bilirubin: 0.42 mg/dL (ref 0.20–1.20)
Total Protein: 6.4 g/dL (ref 6.4–8.3)

## 2016-10-17 LAB — CBC WITH DIFFERENTIAL/PLATELET
BASO%: 0.7 % (ref 0.0–2.0)
Basophils Absolute: 0.1 10*3/uL (ref 0.0–0.1)
EOS%: 1 % (ref 0.0–7.0)
Eosinophils Absolute: 0.1 10*3/uL (ref 0.0–0.5)
HCT: 34.7 % — ABNORMAL LOW (ref 38.4–49.9)
HGB: 12 g/dL — ABNORMAL LOW (ref 13.0–17.1)
LYMPH#: 1 10*3/uL (ref 0.9–3.3)
LYMPH%: 12.8 % — AB (ref 14.0–49.0)
MCH: 29.3 pg (ref 27.2–33.4)
MCHC: 34.5 g/dL (ref 32.0–36.0)
MCV: 84.8 fL (ref 79.3–98.0)
MONO#: 0.3 10*3/uL (ref 0.1–0.9)
MONO%: 4.6 % (ref 0.0–14.0)
NEUT#: 6 10*3/uL (ref 1.5–6.5)
NEUT%: 80.9 % — AB (ref 39.0–75.0)
Platelets: 222 10*3/uL (ref 140–400)
RBC: 4.09 10*6/uL — ABNORMAL LOW (ref 4.20–5.82)
RDW: 15.7 % — ABNORMAL HIGH (ref 11.0–14.6)
WBC: 7.4 10*3/uL (ref 4.0–10.3)

## 2016-10-17 MED ORDER — DIPHENHYDRAMINE HCL 50 MG/ML IJ SOLN
50.0000 mg | Freq: Once | INTRAMUSCULAR | Status: AC
  Administered 2016-10-17: 50 mg via INTRAVENOUS

## 2016-10-17 MED ORDER — PALONOSETRON HCL INJECTION 0.25 MG/5ML
0.2500 mg | Freq: Once | INTRAVENOUS | Status: AC
  Administered 2016-10-17: 0.25 mg via INTRAVENOUS

## 2016-10-17 MED ORDER — FAMOTIDINE IN NACL 20-0.9 MG/50ML-% IV SOLN
20.0000 mg | Freq: Once | INTRAVENOUS | Status: AC
  Administered 2016-10-17: 20 mg via INTRAVENOUS

## 2016-10-17 MED ORDER — DEXAMETHASONE SODIUM PHOSPHATE 100 MG/10ML IJ SOLN
20.0000 mg | Freq: Once | INTRAMUSCULAR | Status: AC
  Administered 2016-10-17: 20 mg via INTRAVENOUS
  Filled 2016-10-17: qty 2

## 2016-10-17 MED ORDER — FAMOTIDINE IN NACL 20-0.9 MG/50ML-% IV SOLN
INTRAVENOUS | Status: AC
  Filled 2016-10-17: qty 50

## 2016-10-17 MED ORDER — PROCHLORPERAZINE MALEATE 10 MG PO TABS: 10.0000 mg | ORAL_TABLET | Freq: Four times a day (QID) | ORAL | 0 refills | Status: DC | PRN

## 2016-10-17 MED ORDER — DEXTROSE 5 % IV SOLN
45.0000 mg/m2 | Freq: Once | INTRAVENOUS | Status: AC
  Administered 2016-10-17: 78 mg via INTRAVENOUS
  Filled 2016-10-17: qty 13

## 2016-10-17 MED ORDER — DIPHENHYDRAMINE HCL 50 MG/ML IJ SOLN
INTRAMUSCULAR | Status: AC
  Filled 2016-10-17: qty 1

## 2016-10-17 MED ORDER — PALONOSETRON HCL INJECTION 0.25 MG/5ML
INTRAVENOUS | Status: AC
  Filled 2016-10-17: qty 5

## 2016-10-17 MED ORDER — SODIUM CHLORIDE 0.9 % IV SOLN
Freq: Once | INTRAVENOUS | Status: AC
  Administered 2016-10-17: 11:00:00 via INTRAVENOUS

## 2016-10-17 MED ORDER — SODIUM CHLORIDE 0.9 % IV SOLN
160.0000 mg | Freq: Once | INTRAVENOUS | Status: AC
  Administered 2016-10-17: 160 mg via INTRAVENOUS
  Filled 2016-10-17: qty 16

## 2016-10-17 NOTE — Telephone Encounter (Signed)
Patient was given a copy of the AVS report and appointment schedule per 10/17/16 los.

## 2016-10-17 NOTE — Patient Instructions (Signed)
[facility] Discharge Instructions for Patients Receiving Chemotherapy  Today you received the following chemotherapy agents Taxol and Carboplatin   To help prevent nausea and vomiting after your treatment, we encourage you to take your nausea medication as directed. No Zofran for 3 days. Take Compazine instead.    If you develop nausea and vomiting that is not controlled by your nausea medication, call the clinic.   BELOW ARE SYMPTOMS THAT SHOULD BE REPORTED IMMEDIATELY:  *FEVER GREATER THAN 100.5 F  *CHILLS WITH OR WITHOUT FEVER  NAUSEA AND VOMITING THAT IS NOT CONTROLLED WITH YOUR NAUSEA MEDICATION  *UNUSUAL SHORTNESS OF BREATH  *UNUSUAL BRUISING OR BLEEDING  TENDERNESS IN MOUTH AND THROAT WITH OR WITHOUT PRESENCE OF ULCERS  *URINARY PROBLEMS  *BOWEL PROBLEMS  UNUSUAL RASH Items with * indicate a potential emergency and should be followed up as soon as possible.  Feel free to call the clinic you have any questions or concerns. The clinic phone number is [phone].  Please show the [facility] at check-in to the Emergency Department and triage nurse.  Paclitaxel injection What is this medicine? PACLITAXEL (PAK li TAX el) is a chemotherapy drug. It targets fast dividing cells, like cancer cells, and causes these cells to die. This medicine is used to treat ovarian cancer, breast cancer, and other cancers. This medicine may be used for other purposes; ask your health care provider or pharmacist if you have questions. COMMON BRAND NAME(S): Onxol, Taxol What should I tell my health care provider before I take this medicine? They need to know if you have any of these conditions: -blood disorders -irregular heartbeat -infection (especially a virus infection such as chickenpox, cold sores, or herpes) -liver disease -previous or ongoing radiation therapy -an unusual or allergic reaction to paclitaxel, alcohol, polyoxyethylated castor oil, other  chemotherapy agents, other medicines, foods, dyes, or preservatives -pregnant or trying to get pregnant -breast-feeding How should I use this medicine? This drug is given as an infusion into a vein. It is administered in a hospital or clinic by a specially trained health care professional. Talk to your pediatrician regarding the use of this medicine in children. Special care may be needed. Overdosage: If you think you have taken too much of this medicine contact a poison control center or emergency room at once. NOTE: This medicine is only for you. Do not share this medicine with others. What if I miss a dose? It is important not to miss your dose. Call your doctor or health care professional if you are unable to keep an appointment. What may interact with this medicine? Do not take this medicine with any of the following medications: -disulfiram -metronidazole This medicine may also interact with the following medications: -cyclosporine -diazepam -ketoconazole -medicines to increase blood counts like filgrastim, pegfilgrastim, sargramostim -other chemotherapy drugs like cisplatin, doxorubicin, epirubicin, etoposide, teniposide, vincristine -quinidine -testosterone -vaccines -verapamil Talk to your doctor or health care professional before taking any of these medicines: -acetaminophen -aspirin -ibuprofen -ketoprofen -naproxen This list may not describe all possible interactions. Give your health care provider a list of all the medicines, herbs, non-prescription drugs, or dietary supplements you use. Also tell them if you smoke, drink alcohol, or use illegal drugs. Some items may interact with your medicine. What should I watch for while using this medicine? Your condition will be monitored carefully while you are receiving this medicine. You will need important blood work done while you are taking this medicine. This medicine can cause serious  allergic reactions. To reduce your risk  you will need to take other medicine(s) before treatment with this medicine. If you experience allergic reactions like skin rash, itching or hives, swelling of the face, lips, or tongue, tell your doctor or health care professional right away. In some cases, you may be given additional medicines to help with side effects. Follow all directions for their use. This drug may make you feel generally unwell. This is not uncommon, as chemotherapy can affect healthy cells as well as cancer cells. Report any side effects. Continue your course of treatment even though you feel ill unless your doctor tells you to stop. Call your doctor or health care professional for advice if you get a fever, chills or sore throat, or other symptoms of a cold or flu. Do not treat yourself. This drug decreases your body's ability to fight infections. Try to avoid being around people who are sick. This medicine may increase your risk to bruise or bleed. Call your doctor or health care professional if you notice any unusual bleeding. Be careful brushing and flossing your teeth or using a toothpick because you may get an infection or bleed more easily. If you have any dental work done, tell your dentist you are receiving this medicine. Avoid taking products that contain aspirin, acetaminophen, ibuprofen, naproxen, or ketoprofen unless instructed by your doctor. These medicines may hide a fever. Do not become pregnant while taking this medicine. Women should inform their doctor if they wish to become pregnant or think they might be pregnant. There is a potential for serious side effects to an unborn child. Talk to your health care professional or pharmacist for more information. Do not breast-feed an infant while taking this medicine. Men are advised not to father a child while receiving this medicine. This product may contain alcohol. Ask your pharmacist or healthcare provider if this medicine contains alcohol. Be sure to tell all  healthcare providers you are taking this medicine. Certain medicines, like metronidazole and disulfiram, can cause an unpleasant reaction when taken with alcohol. The reaction includes flushing, headache, nausea, vomiting, sweating, and increased thirst. The reaction can last from 30 minutes to several hours. What side effects may I notice from receiving this medicine? Side effects that you should report to your doctor or health care professional as soon as possible: -allergic reactions like skin rash, itching or hives, swelling of the face, lips, or tongue -low blood counts - This drug may decrease the number of white blood cells, red blood cells and platelets. You may be at increased risk for infections and bleeding. -signs of infection - fever or chills, cough, sore throat, pain or difficulty passing urine -signs of decreased platelets or bleeding - bruising, pinpoint red spots on the skin, black, tarry stools, nosebleeds -signs of decreased red blood cells - unusually weak or tired, fainting spells, lightheadedness -breathing problems -chest pain -high or low blood pressure -mouth sores -nausea and vomiting -pain, swelling, redness or irritation at the injection site -pain, tingling, numbness in the hands or feet -slow or irregular heartbeat -swelling of the ankle, feet, hands Side effects that usually do not require medical attention (report to your doctor or health care professional if they continue or are bothersome): -bone pain -complete hair loss including hair on your head, underarms, pubic hair, eyebrows, and eyelashes -changes in the color of fingernails -diarrhea -loosening of the fingernails -loss of appetite -muscle or joint pain -red flush to skin -sweating This list may not describe all  possible side effects. Call your doctor for medical advice about side effects. You may report side effects to FDA at 1-800-FDA-1088. Where should I keep my medicine? This drug is given in  a hospital or clinic and will not be stored at home. NOTE: This sheet is a summary. It may not cover all possible information. If you have questions about this medicine, talk to your doctor, pharmacist, or health care provider.  2018 Elsevier/Gold Standard (2015-04-07 19:58:00)  Carboplatin injection What is this medicine? CARBOPLATIN (KAR boe pla tin) is a chemotherapy drug. It targets fast dividing cells, like cancer cells, and causes these cells to die. This medicine is used to treat ovarian cancer and many other cancers. This medicine may be used for other purposes; ask your health care provider or pharmacist if you have questions. COMMON BRAND NAME(S): Paraplatin What should I tell my health care provider before I take this medicine? They need to know if you have any of these conditions: -blood disorders -hearing problems -kidney disease -recent or ongoing radiation therapy -an unusual or allergic reaction to carboplatin, cisplatin, other chemotherapy, other medicines, foods, dyes, or preservatives -pregnant or trying to get pregnant -breast-feeding How should I use this medicine? This drug is usually given as an infusion into a vein. It is administered in a hospital or clinic by a specially trained health care professional. Talk to your pediatrician regarding the use of this medicine in children. Special care may be needed. Overdosage: If you think you have taken too much of this medicine contact a poison control center or emergency room at once. NOTE: This medicine is only for you. Do not share this medicine with others. What if I miss a dose? It is important not to miss a dose. Call your doctor or health care professional if you are unable to keep an appointment. What may interact with this medicine? -medicines for seizures -medicines to increase blood counts like filgrastim, pegfilgrastim, sargramostim -some antibiotics like amikacin, gentamicin, neomycin, streptomycin,  tobramycin -vaccines Talk to your doctor or health care professional before taking any of these medicines: -acetaminophen -aspirin -ibuprofen -ketoprofen -naproxen This list may not describe all possible interactions. Give your health care provider a list of all the medicines, herbs, non-prescription drugs, or dietary supplements you use. Also tell them if you smoke, drink alcohol, or use illegal drugs. Some items may interact with your medicine. What should I watch for while using this medicine? Your condition will be monitored carefully while you are receiving this medicine. You will need important blood work done while you are taking this medicine. This drug may make you feel generally unwell. This is not uncommon, as chemotherapy can affect healthy cells as well as cancer cells. Report any side effects. Continue your course of treatment even though you feel ill unless your doctor tells you to stop. In some cases, you may be given additional medicines to help with side effects. Follow all directions for their use. Call your doctor or health care professional for advice if you get a fever, chills or sore throat, or other symptoms of a cold or flu. Do not treat yourself. This drug decreases your body's ability to fight infections. Try to avoid being around people who are sick. This medicine may increase your risk to bruise or bleed. Call your doctor or health care professional if you notice any unusual bleeding. Be careful brushing and flossing your teeth or using a toothpick because you may get an infection or bleed more easily. If  you have any dental work done, tell your dentist you are receiving this medicine. Avoid taking products that contain aspirin, acetaminophen, ibuprofen, naproxen, or ketoprofen unless instructed by your doctor. These medicines may hide a fever. Do not become pregnant while taking this medicine. Women should inform their doctor if they wish to become pregnant or think  they might be pregnant. There is a potential for serious side effects to an unborn child. Talk to your health care professional or pharmacist for more information. Do not breast-feed an infant while taking this medicine. What side effects may I notice from receiving this medicine? Side effects that you should report to your doctor or health care professional as soon as possible: -allergic reactions like skin rash, itching or hives, swelling of the face, lips, or tongue -signs of infection - fever or chills, cough, sore throat, pain or difficulty passing urine -signs of decreased platelets or bleeding - bruising, pinpoint red spots on the skin, black, tarry stools, nosebleeds -signs of decreased red blood cells - unusually weak or tired, fainting spells, lightheadedness -breathing problems -changes in hearing -changes in vision -chest pain -high blood pressure -low blood counts - This drug may decrease the number of white blood cells, red blood cells and platelets. You may be at increased risk for infections and bleeding. -nausea and vomiting -pain, swelling, redness or irritation at the injection site -pain, tingling, numbness in the hands or feet -problems with balance, talking, walking -trouble passing urine or change in the amount of urine Side effects that usually do not require medical attention (report to your doctor or health care professional if they continue or are bothersome): -hair loss -loss of appetite -metallic taste in the mouth or changes in taste This list may not describe all possible side effects. Call your doctor for medical advice about side effects. You may report side effects to FDA at 1-800-FDA-1088. Where should I keep my medicine? This drug is given in a hospital or clinic and will not be stored at home. NOTE: This sheet is a summary. It may not cover all possible information. If you have questions about this medicine, talk to your doctor, pharmacist, or health care  provider.  2018 Elsevier/Gold Standard (2007-09-11 14:38:05)

## 2016-10-17 NOTE — Telephone Encounter (Signed)
Call from [facility] stating they got a flag when filling the compazine that it can interact with KCl and cause upper GI injury. Please call back pharmacy and advise.

## 2016-10-17 NOTE — Progress Notes (Signed)
  Radiation Oncology         [phone] ________________________________  Name: [name] [name] MRN: [id number]  Date: 10/17/2016  DOB: January 02, 1941  SIMULATION AND TREATMENT PLANNING NOTE    ICD-9-CM ICD-10-CM   1. T1b N2 M0 disease (stage IIIA).adenocarcinoma of the right middle lobe of lung ([place]) 162.4 C34.2     DIAGNOSIS:  76 year-old gentleman with Stage IIIA ([id number]) adenocarcinoma of the right middle lobe of lung.  NARRATIVE:  The patient was brought to the [facility].  Identity was confirmed.  All relevant records and images related to the planned course of therapy were reviewed.  The patient freely provided informed written consent to proceed with treatment after reviewing the details related to the planned course of therapy. The consent form was witnessed and verified by the simulation staff.  Then, the patient was set-up in a stable reproducible  supine position for radiation therapy.  CT images were obtained.  Surface markings were placed.  The CT images were loaded into the planning software.  Then the target and avoidance structures were contoured.  Treatment planning then occurred.  The radiation prescription was entered and confirmed.  Then, I designed and supervised the construction of a total of 6 medically necessary complex treatment devices, including a BodyFix immobilization mold custom fitted to the patient along with 5 multileaf collimators conformally shaped radiation around the treatment target while shielding critical structures such as the heart and spinal cord maximally.  I have requested : 3D Simulation  I have requested a DVH of the following structures: Left lung, right lung, spinal cord, heart, esophagus, and target.  I have ordered:Nutrition Consult  SPECIAL TREATMENT PROCEDURE:  The planned course of therapy using radiation constitutes a special treatment procedure. Special care is required in the management of this patient for the following  reasons.  The patient will be receiving concurrent chemotherapy requiring careful monitoring for increased toxicities of treatment including periodic laboratory values.  The special nature of the planned course of radiotherapy will require increased physician supervision and oversight to ensure patient's safety with optimal treatment outcomes.  PLAN:  The patient will receive 66 Gy in 33 fractions.  ________________________________  Sheral Apley Tammi Klippel, M.D.  This document serves as a record of services personally performed by Tyler Pita, MD. It was created on his behalf by Arlyce Harman, a trained medical scribe. The creation of this record is based on the scribe's personal observations and the provider's statements to them. This document has been checked and approved by the attending provider.

## 2016-10-17 NOTE — Progress Notes (Signed)
Please see the Nurse Progress Note in the MD Initial Consult Encounter for this patient. 

## 2016-10-17 NOTE — Progress Notes (Signed)
76 year old male diagnosed with lung cancer patient of Dr. Julien Nordmann and Dr. Tammi Klippel.  Past medical history includes PVD, osteoporosis, MI, GERD, CAD, COPD, chronic kidney disease, and atrial fibrillation.  Medications include Xanax, Synthroid, Zofran, Crestor, and Coumadin.  Labs were reviewed.  Height: 66 inches. Weight: 140.4 pounds. Usual body weight: 159 pounds March 2018 BMI: 22.66.  Patient was diagnosed with moderate malnutrition in the context of acute illness, during his hospitalization. He was drinking 2 ensure Enlive daily. Patient denies nausea, vomiting, diarrhea or constipation. He is eating 3 small meals and tries to snack in between his meals. Patient appears to have good family support.  Nutrition diagnosis:  Unintended weight loss related to lung cancer and associated treatments as evidenced by 19 pound weight loss over 2 months.  Intervention: Patient educated to continue high-calorie, high-protein foods in more frequent meals and snacks. Provided a high-calorie, high-protein fact sheet. Encouraged patient to consume oral nutrition supplements twice a day and provided coupons. Recommended patient take nausea medication if he feels nausea so that it does not impact oral nutrition. Questions were answered.  Teach back method used.  Contact information given.  Monitoring, evaluation, goals: Patient will tolerate increased calories and protein to minimize weight loss.  Next visit: Monday, May 14 during infusion.  **Disclaimer: This note was dictated with voice recognition software. Similar sounding words can inadvertently be transcribed and this note may contain transcription errors which may not have been corrected upon publication of note.**

## 2016-10-17 NOTE — Progress Notes (Signed)
[place] [phone]   [phone]  OFFICE PROGRESS NOTE  Mickie Hillier, MD [place] [address]  DIAGNOSIS: Stage IIIA (T1b, N2, M0) non-small cell lung cancer, adenocarcinoma with positive PDL 1 expression 50% diagnosed in March 2018 and presented with right upper lobe lung nodule in addition to mediastinal lymphadenopathy.  PRIOR THERAPY: None.  CURRENT THERAPY: Course of concurrent chemoradiation with weekly carboplatin for AUC of 2 and paclitaxel 45 MG/M2. First dose of chemotherapy 10/17/2016  INTERVAL HISTORY: [name] [name] 76 y.o. male returns to the clinic today for follow-up visit accompanied by his wife and son. The patient is feeling fine today with no specific complaints. He is eating much better. He continues to have the [facility] catheter for urinary retention and recent urinary tract infection. He also started physical therapy after the pelvic fracture. The patient denied having any chest pain, shortness breath, cough or hemoptysis. He has no fever or chills. He is here today for evaluation before starting the first dose of chemotherapy.  MEDICAL HISTORY: Past Medical History:  Diagnosis Date  . Adenocarcinoma of right lung, stage 3 ([facility]) 09/20/2016  . Atrial fibrillation ([facility])    no hx of reported at preop visit of 04/21/11   . CAD (coronary artery disease)   . Carotid artery occlusion    left carotid endarterectomy  . Chronic kidney disease    AAA repair with reimplant of renals   . CHRONIC OBSTRUCTIVE PULMONARY DISEASE    pt denied at visit of 04/21/11   . CORONARY ARTERY DISEASE   . Disorder of left sacroiliac joint 08/24/2016  . DIVERTICULOSIS OF COLON   . GERD   . H/O hiatal hernia   . Headache(784.0)    Hx: of years of years  . HYPERTENSION   . HYPOTHYROIDISM   . JOINT EFFUSION, KNEE   . KNEE, ARTHRITIS, DEGEN./OSTEO    right knee   . Myocardial infarction ([facility])    1994   .  NEPHROLITHIASIS, HX OF   . OSTEOPOROSIS   . Other dysphagia   . PERIPHERAL VASCULAR DISEASE    AAA - 1994 with reimplant of renals   . Peripheral vascular disease ([place])    subclavian stenosis PTA - 3/08   . Pure hypercholesterolemia   . Renal artery stenosis ([facility])     ALLERGIES:  is allergic to imitrex [sumatriptan] and lipitor [atorvastatin].  MEDICATIONS:  Current Outpatient Prescriptions  Medication Sig Dispense Refill  . acetaminophen (TYLENOL) 325 MG tablet Take 325-650 mg by mouth every 6 (six) hours as needed for headache (for pain).     [name] ALPRAZolam (XANAX) 0.25 MG tablet Take 1 tablet (0.25 mg total) by mouth 3 (three) times daily as needed for anxiety. 30 tablet 0  . bumetanide (BUMEX) 1 MG tablet Take 1 tablet (1 mg total) by mouth daily as needed (swelling). 30 tablet 1  . ciprofloxacin (CIPRO) 250 MG tablet Take 1 tablet (250 mg total) by mouth 2 (two) times daily. (Patient not taking: Reported on 10/17/2016) 14 tablet 0  . gabapentin (NEURONTIN) 100 MG capsule Take 1 capsule (100 mg total) by mouth at bedtime. 30 capsule 1  . lansoprazole (PREVACID) 15 MG capsule Take 1 capsule (15 mg total) by mouth daily. 30 capsule 5  . levothyroxine (SYNTHROID, LEVOTHROID) 112 MCG tablet Take 1 tablet (112 mcg total) by mouth daily. 30 tablet 5  . linaclotide (LINZESS) 145 MCG CAPS capsule Take 1  capsule (145 mcg total) by mouth daily before breakfast. 30 capsule 1  . metoprolol (LOPRESSOR) 25 MG tablet Take 0.5 tablets (12.5 mg total) by mouth 2 (two) times daily. 60 tablet 0  . niacin (SLO-NIACIN) 500 MG tablet Take 3 tablets (1,500 mg total) by mouth at bedtime. 30 tablet 0  . nitroGLYCERIN (NITROSTAT) 0.4 MG SL tablet Place 1 tablet (0.4 mg total) under the tongue every 5 (five) minutes as needed for chest pain. (Patient not taking: Reported on 10/17/2016) 25 tablet 10  . ondansetron (ZOFRAN) 8 MG tablet Take 1 tablet (8 mg total) by mouth every 8 (eight) hours as needed for nausea or  vomiting. (Patient not taking: Reported on 10/17/2016) 20 tablet 0  . potassium chloride SA (K-DUR,KLOR-CON) 20 MEQ tablet Take 1 tablet (20 mEq total) by mouth daily. 30 tablet 0  . rosuvastatin (CRESTOR) 20 MG tablet Take 1 tablet (20 mg total) by mouth daily. 30 tablet 5  . tamsulosin (FLOMAX) 0.4 MG CAPS capsule Take 1 capsule (0.4 mg total) by mouth daily after breakfast. 30 capsule 1  . warfarin (COUMADIN) 5 MG tablet 1-1/2 tablet every day except 2 tablets Tuesday Thursday Saturday (Patient taking differently: 1 tablet every day except 1-1/2 tablets Tuesday Thursday Saturday) 90 tablet 1   No current facility-administered medications for this visit.     SURGICAL HISTORY:  Past Surgical History:  Procedure Laterality Date  . ABDOMINAL AORTIC ANEURYSM REPAIR     with reimplantation of renals   . CARDIAC CATHETERIZATION     2008  . CAROTID ENDARTERECTOMY Left Jan. 30, 2015   CEA  . CHOLECYSTECTOMY  2000   Gall Bladder  . COLONOSCOPY W/ BIOPSIES AND POLYPECTOMY     Hx: of  . CORONARY ARTERY BYPASS GRAFT     1995  . CORONARY STENT PLACEMENT     Hx: of  . ENDARTERECTOMY Left 07/19/2013   Procedure: ENDARTERECTOMY CAROTID;  Surgeon: Mal Misty, MD;  Location: [facility];  Service: Vascular;  Laterality: Left;  . GALLBLADDER SURGERY  2004   . JOINT REPLACEMENT     partial knee replacement on left 2002   . KNEE SURGERY    . ORIF PELVIC FRACTURE Left 08/25/2016   Procedure: OPEN REDUCTION INTERNAL FIXATION (ORIF) PELVIC FRACTURE; plate on front, SI screw on the back;  Surgeon: Altamese Carbon, MD;  Location: [facility];  Service: Orthopedics;  Laterality: Left;  . OTHER SURGICAL HISTORY     left subclavian stenosis surgery PTA 08/2006   . OTHER SURGICAL HISTORY     carotid surgery on right 2004   . TOTAL KNEE ARTHROPLASTY  04/29/2011   Procedure: TOTAL KNEE ARTHROPLASTY;  Surgeon: Gearlean Alf;  Location: [facility];  Service: Orthopedics;  Laterality: Right;  . urinary retention    . VASCULAR  SURGERY     AAA  . VASECTOMY  1973  . VIDEO BRONCHOSCOPY WITH ENDOBRONCHIAL ULTRASOUND [name] 09/09/2016   Procedure: VIDEO BRONCHOSCOPY WITH ENDOBRONCHIAL ULTRASOUND;  Surgeon: Grace Isaac, MD;  Location: [facility];  Service: Thoracic;  Laterality: [name];    REVIEW OF SYSTEMS:  A comprehensive review of systems was negative except for: Constitutional: positive for fatigue Genitourinary: positive for decreased stream Musculoskeletal: positive for muscle weakness   PHYSICAL EXAMINATION: General appearance: alert, cooperative, fatigued and no distress Head: Normocephalic, without obvious abnormality, atraumatic Neck: no adenopathy, no JVD, supple, symmetrical, trachea midline and thyroid not enlarged, symmetric, no tenderness/mass/nodules Lymph nodes: Cervical, supraclavicular, and axillary nodes normal.  Resp: clear to auscultation bilaterally Back: symmetric, no curvature. ROM normal. No CVA tenderness. Cardio: regular rate and rhythm, S1, S2 normal, no murmur, click, rub or gallop GI: soft, non-tender; bowel sounds normal; no masses,  no organomegaly Extremities: extremities normal, atraumatic, no cyanosis or edema  ECOG PERFORMANCE STATUS: 1 - Symptomatic but completely ambulatory  Blood pressure (!) 101/48, pulse 68, temperature 97.8 F (36.6 C), temperature source Oral, resp. rate 18, height '5\' 6"'$  (1.676 m), SpO2 100 %.  LABORATORY DATA: Lab Results  Component Value Date   WBC 7.4 10/17/2016   HGB 12.0 (L) 10/17/2016   HCT 34.7 (L) 10/17/2016   MCV 84.8 10/17/2016   PLT 222 10/17/2016      Chemistry      Component Value Date/Time   NA 133 (L) 10/17/2016 0949   K 3.8 10/17/2016 0949   CL 100 (L) 10/06/2016 0611   CO2 24 10/17/2016 0949   BUN 14.4 10/17/2016 0949   CREATININE 0.7 10/17/2016 0949      Component Value Date/Time   CALCIUM 9.1 10/17/2016 0949   ALKPHOS 106 10/17/2016 0949   AST 23 10/17/2016 0949   ALT 23 10/17/2016 0949   BILITOT 0.42 10/17/2016 0949         RADIOGRAPHIC STUDIES: Dg Chest 2 View  Result Date: 10/03/2016 CLINICAL DATA:  Fever, hypertension, former smoker, atherosclerosis, known right middle lobe 2 cm PET positive nodule. EXAM: CHEST  2 VIEW COMPARISON:  09/08/2016, 09/29/2016 FINDINGS: Previous coronary bypass changes and aortic arch vascular stent. Stable mild cardiac enlargement with normal vascularity. Anterior right middle lobe 2 cm spiculated nodule overlies the right infrahilar vasculature and is obscured by plain radiography. Right hilar and subcarinal adenopathy by PET-CT not confidently demonstrated by plain radiography. No superimposed significant pneumonia, collapse, consolidation, or edema. No effusion or pneumothorax. Trachea is midline. Atherosclerosis noted of the aorta. Degenerative changes of the spine. IMPRESSION: No superimposed acute chest process. Cardiomegaly without CHF Thoracic aortic atherosclerosis Anterior 2 cm right middle lobe nodule, right hilar and subcarinal adenopathy demonstrated by PET-CT not well appreciated by plain radiography. Please refer to the PET-CT of 4 days ago. Electronically Signed   By: Jerilynn Mages.  Shick M.D.   On: 10/03/2016 15:59   Mr Jeri Cos DG Contrast  Result Date: 10/15/2016 CLINICAL DATA:  History of lung cancer, metastatic disease to the brain, continued surveillance. Atrial fibrillation. EXAM: MRI HEAD WITHOUT AND WITH CONTRAST TECHNIQUE: Multiplanar, multiecho pulse sequences of the brain and surrounding structures were obtained without and with intravenous contrast. CONTRAST:  11m MULTIHANCE GADOBENATE DIMEGLUMINE 529 MG/ML IV SOLN COMPARISON:  09/02/2016 MR. FINDINGS: Brain: Restricted diffusion in the RIGHT hemisphere is redemonstrated with some new areas of acute ischemia not seen on the previous scan, consistent with ongoing hypoperfusion and multifocal watershed infarcts. Probable subtle gyriform enhancement on the LEFT, with continued involution of the hemorrhagic watershed lesion  in the LEFT frontal lobe, precontrast gyriform T1 shortening, marked susceptibility on gradient imaging, but no concerning features of nodular postcontrast enhancement to suggest metastatic disease. New enhancement in the RIGHT frontal cortex, see image 131 series 14, also slightly more posteriorly near the motor strip, image 137 series 14, correspond to areas of restricted diffusion on the prior study, and represent luxury perfusion of subacute infarction, rather than metastatic disease. Tiny residual LEFT subdural, minimally improved. Vascular: Flow voids are maintained in the carotid, basilar, and vertebral arteries. Skull and upper cervical spine: Normal marrow signal. Sinuses/Orbits:  No acute findings.  Mild  chronic sinus disease. Other: None. IMPRESSION: No definite intracranial metastatic disease is observed. Continued evolution of the hemorrhagic LEFT frontal infarct, without concerning features for LEFT frontal metastasis. Atrophy and small vessel disease. Acute on subacute watershed infarcts continue to develop in the RIGHT hemisphere. There is concern for proximal high-grade stenosis. An embolic source is less favored. Consider CTA head neck for further evaluation. Electronically Signed   By: Staci Righter M.D.   On: 10/15/2016 12:03   US Renal  Result Date: 10/05/2016 CLINICAL DATA:  UTI. EXAM: RENAL / URINARY TRACT ULTRASOUND COMPLETE COMPARISON:  PET-CT 09/29/2016. FINDINGS: Right Kidney: Length: 12.5 cm. Echogenicity within normal limits. No mass or hydronephrosis visualized. Left Kidney: Length: 12.3 cm. Echogenicity within normal limits. No mass or hydronephrosis visualized. Bladder: Foley catheter in the bladder.  Bladder nondistended. IMPRESSION: No acute or focal abnormality identified. No evidence of hydronephrosis. Foley catheter noted in the bladder. Bladder nondistended. Electronically Signed   By: Marcello Moores  Register   On: 10/05/2016 14:26   Nm Pet Image Initial (pi) Skull Base To  Thigh  Result Date: 09/29/2016 CLINICAL DATA:  Initial treatment strategy for right lung cancer. EXAM: NUCLEAR MEDICINE PET SKULL BASE TO THIGH TECHNIQUE: 7.0 mCi F-18 FDG was injected intravenously. Full-ring PET imaging was performed from the skull base to thigh after the radiotracer. CT data was obtained and used for attenuation correction and anatomic localization. FASTING BLOOD GLUCOSE:  Value: 139 mg/dl COMPARISON:  CT scans from 08/22/2016 FINDINGS: NECK No hypermetabolic lymph nodes in the neck. Mild chronic right anterior ethmoid sinusitis. Carotid atherosclerotic calcification. CHEST The spiculated 2.0 cm right middle lobe nodule has a maximum standard uptake value of 11.7. 1.8 cm in short axis subcarinal node has a maximum SUV of 14.8. Right hilar node measuring approximately 8 mm in short axis on image 74/4 has a maximum SUV of 10.3. The tiny nodules at the right lung apex are not discernibly hypermetabolic but are below sensitive PET-CT size thresholds. The previous ground-glass opacity posteriorly in the left upper lobe is only faintly apparent on image 25 of series 8 of today' s exam and is not visibly hypermetabolic. Coronary, aortic arch, and branch vessel atherosclerotic vascular disease. Prior CABG. ABDOMEN/PELVIS Activity along the postsurgical sites in the pelvis is felt to be inflammatory. Foley catheter noted. Faint activity along the left lumbar transverse process sites and also along the severely degenerated left L5-S1 facet joint. No findings of metastatic disease to the abdomen or pelvis. SKELETON No focal hypermetabolic activity to suggest skeletal metastasis. Recent ORIF of pelvic fractures. Left lumbar transverse process fractures as shown on prior CT. Ventral supraumbilical hernia contains omental adipose tissue. IMPRESSION: 1. Hypermetabolic 2.0 cm spiculated right middle lobe nodule, SUV 11.7, compatible with malignancy. Hypermetabolic subcarinal and right hilar adenopathy.  Assuming non-small cell lung cancer, this represents T1b N2 M0 disease (stage IIIA). 2. Other imaging findings of potential clinical significance: Recent ORIF of pelvic fractures. Coronary, aortic arch, and branch vessel atherosclerotic vascular disease. Aortoiliac atherosclerotic vascular disease. Mild chronic right anterior ethmoid sinusitis. The tiny nodules at the right lung apex and ground-glass nodule in the left upper lobe posteriorly are not visibly hypermetabolic but are below sensitive PET-CT size thresholds, and may merit surveillance. Electronically Signed   By: Van Clines M.D.   On: 09/29/2016 16:18    ASSESSMENT AND PLAN: This is a very pleasant 76 years old white male recently diagnosed with a stage IIIa non-small cell lung cancer, adenocarcinoma with positive PDL 1  expression  50%. The patient is here today to start the first cycle of concurrent chemoradiation with weekly carboplatin and paclitaxel. He is feeling fine and we will proceed with the first cycle as a scheduled. I will see him back for follow-up visit in 2 weeks for evaluation and management of any adverse effect of his treatment. He was advised to call immediately if he has any concerning symptoms in the interval. The patient voices understanding of current disease status and treatment options and is in agreement with the current care plan. All questions were answered. The patient knows to call the clinic with any problems, questions or concerns. We can certainly see the patient much sooner if necessary. I spent 10 minutes counseling the patient face to face. The total time spent in the appointment was 15 minutes.  Disclaimer: This note was dictated with voice recognition software. Similar sounding words can inadvertently be transcribed and may not be corrected upon review.

## 2016-10-17 NOTE — Progress Notes (Signed)
Radiation Oncology         [phone] ________________________________  Name: [name] [name]            MRN: [id number]         Date: 08/31/2016                      DOB: 1940-09-27  Follow Up New Visit:  CC: [name] Hillier, MD  No ref. provider found  Diagnosis:   76 year-old gentleman with Stage IIIA ([id number]) adenocarcinoma of the right middle lobe of lung.  Narrative:    [name] [name] a 76 y.o. maleoriginally seen as an inpatient on 09/08/16, at the request of Dr. Earlie Server for newly diagnosed putative stage IV lung cancer with suspected metastasis to the brain. He was admitted to the hospital on 08/22/2016 after sustaining a 10 foot fall from a ladder while attempting to clean out his stove pipeon his roof at home. The patient sustained an open book pelvic fracture as well as fractures of the left transverse processes of L2-S1 as a result of his fall. CT Head obtained on admissionfailed to demonstrate any acute trauma but did reveal a 2 cm mass in the anterior left frontal lobe with mild surrounding vasogenic edema. No other acute intracranial abnormalities were noted. The patient also had a CT Chest with contrast which demonstrated a spiculated 2.0 x 2.8 cm mass in the right middle lobe consistent with a primary carcinoma of the lung with metastatic adenopathy in the subcarinal region (subcarinal lymph node measures 3.7 x 2.3). There were several other small, bilateralpulmonary nodules which were too small to characterize.     His course was complicated by a pubic symphysis fracture requiring surgery on 08/25/2016. He has recently transferred to the inpatient rehabilitation center and remains nonweightbearing on the left lower extremity with weight-bearing as tolerated on the right lower extremity for transfer only frombed to chair currently. During the course of his hospitalization, the patient developed bilateral lower extremity DVTs which are being managed with Lovenox  daily and has also been treated for pneumonia.   MRI Brain with SRS protocol was performed on 09/02/16 for further evaluation of the left frontal lobe mass noted on recent CT. This revealed NO definite enhancing lesion of the brain with findings favoring a subacute to chronic hemorrhagic infarct or cortical contusion. His case was presented in conference and findings were felt to be less likely disease, and rather infarct. Recommendations were to proceed with repeat imaging. He underwent an MRI on  09/02/16 for further evaluation of the left frontal lobe mass noted on recent CT.  This revealed no definite enhancing lesion of the brain with findings favoring a subacute to chronic hemorrhagic infarct or cortical contusion. His case was presented in conference and findings were felt to be less likely disease, and rather infarct. Recommendations were to proceed with repeat imaging. He underwent repeat Brain MRI on 10/15/16 which showed no definite intracranial metastatic disease. There was continued evolution of the hemorrhagic left frontal infarct, without concerning features for left frontal metastasis.                      On review of systems, the patient reports that he is doing well overall. He denies any chest pain, shortness of breath, cough, fevers, chills, night sweats. He reports a mild productive cough. He denies hemoptysis. He reports 17 lb weight loss in the last few weeks. He was referred to  [facility] by Dr. Julien Nordmann. He denies any bowel disturbances, and denies abdominal pain, nausea or vomiting. He denies trouble swallowing. He suffers from urinary retention thus, wears a foley catheter. He reports pain in the left hip at time, 8 weeks out surgery. He works with [employer] and has been able to put some weight on his left leg. He complains of weakness in lower extremities worse on left. He reports left lower leg swelling. He denies headache or visual changes. A complete review of systems is obtained  and is otherwise negative.  Past Medical History:  Past Medical History:  Diagnosis Date  . Adenocarcinoma of right lung, stage 3 ([place]) 09/20/2016  . Atrial fibrillation ([place])    no hx of reported at preop visit of 04/21/11   . CAD (coronary artery disease)   . Carotid artery occlusion    left carotid endarterectomy  . Chronic kidney disease    AAA repair with reimplant of renals   . CHRONIC OBSTRUCTIVE PULMONARY DISEASE    pt denied at visit of 04/21/11   . CORONARY ARTERY DISEASE   . Disorder of left sacroiliac joint 08/24/2016  . DIVERTICULOSIS OF COLON   . GERD   . H/O hiatal hernia   . Headache(784.0)    Hx: of years of years  . HYPERTENSION   . HYPOTHYROIDISM   . JOINT EFFUSION, KNEE   . KNEE, ARTHRITIS, DEGEN./OSTEO    right knee   . Myocardial infarction ([place])    1994   . NEPHROLITHIASIS, HX OF   . OSTEOPOROSIS   . Other dysphagia   . PERIPHERAL VASCULAR DISEASE    AAA - 1994 with reimplant of renals   . Peripheral vascular disease ([place])    subclavian stenosis PTA - 3/08   . Pure hypercholesterolemia   . Renal artery stenosis [facility])     Past Surgical History: Past Surgical History:  Procedure Laterality Date  . ABDOMINAL AORTIC ANEURYSM REPAIR     with reimplantation of renals   . CARDIAC CATHETERIZATION     2008  . CAROTID ENDARTERECTOMY Left Jan. 30, 2015   CEA  . CHOLECYSTECTOMY  2000   Gall Bladder  . COLONOSCOPY W/ BIOPSIES AND POLYPECTOMY     Hx: of  . CORONARY ARTERY BYPASS GRAFT     1995  . CORONARY STENT PLACEMENT     Hx: of  . ENDARTERECTOMY Left 07/19/2013   Procedure: ENDARTERECTOMY CAROTID;  Surgeon: Mal Misty, MD;  Location: [facility];  Service: Vascular;  Laterality: Left;  . GALLBLADDER SURGERY  2004   . JOINT REPLACEMENT     partial knee replacement on left 2002   . KNEE SURGERY    . ORIF PELVIC FRACTURE Left 08/25/2016   Procedure: OPEN REDUCTION INTERNAL FIXATION (ORIF) PELVIC FRACTURE; plate on front, SI screw on the back;  Surgeon:  Altamese Pen[place], MD;  Location: [facility];  Service: Orthopedics;  Laterality: Left;  . OTHER SURGICAL HISTORY     left subclavian stenosis surgery PTA 08/2006   . OTHER SURGICAL HISTORY     carotid surgery on right 2004   . TOTAL KNEE ARTHROPLASTY  04/29/2011   Procedure: TOTAL KNEE ARTHROPLASTY;  Surgeon: Gearlean Alf;  Location: [facility];  Service: Orthopedics;  Laterality: Right;  . urinary retention    . VASCULAR SURGERY     AAA  . VASECTOMY  1973  . VIDEO BRONCHOSCOPY WITH ENDOBRONCHIAL ULTRASOUND [name] 09/09/2016   Procedure: VIDEO BRONCHOSCOPY WITH ENDOBRONCHIAL ULTRASOUND;  Surgeon: Grace Isaac, MD;  Location[facility];  Service: Thoracic;  Laterality: [name];    Social History:  Social History   Social History  . Marital status: Married    Spouse name: [name]  . Number of children: [name]  . Years of education: [name]   Occupational History  . Not on file.   Social History Main Topics  . Smoking status: Former Smoker    Types: Cigarettes    Quit date: 06/20/1992  . Smokeless tobacco: Never Used  . Alcohol use No  . Drug use: No  . Sexual activity: Not on file   Other Topics Concern  . Not on file   Social History Narrative  . No narrative on file  The patient is married and lives in [place]. He's retired from working in [employer]  Family History: Family History  Problem Relation Age of Onset  . Hypertension Mother   . Heart disease Father   . Heart attack Father   . Diabetes Sister   . Cancer Brother 38    Lung  . Diabetes Sister     Physical Findings: Wt Readings from Last 3 Encounters:  10/03/16 140 lb 6.4 oz (63.7 kg)  09/20/16 141 lb (64 kg)  08/31/16 185 lb 6.4 oz (84.1 kg)   Temp Readings from Last 3 Encounters:  10/17/16 97.9 F (36.6 C) (Oral)  10/07/16 97.7 F (36.5 C) (Oral)  09/20/16 97.7 F (36.5 C) (Oral)   BP Readings from Last 3 Encounters:  10/17/16 118/60  10/07/16 (!) 121/55  09/29/16 (!) 154/72   Pulse Readings from Last 3  Encounters:  10/17/16 63  10/07/16 (!) 58  09/29/16 85   In general this is a well appearing caucasian male in no acute distress. He presents in wheelchair. He is alert and oriented x4 and appropriate throughout the examination. HEENT reveals that the patient is normocephalic, atraumatic. EOMs are intact. PERRLA. Skin is intact without any evidence of gross lesions. Cardiovascular exam reveals a regular rate and rhythm, no clicks rubs or murmurs are auscultated. Chest is clear to auscultation bilaterally. Lymphatic assessment is performed and does not reveal any adenopathy in the cervical, supraclavicular, axillary, or inguinal chains. Abdomen has active bowel sounds in all quadrants and is intact. The abdomen is soft, non tender, non distended. Lower extremities are negative for deep calf tenderness, cyanosis or clubbing. Left lower extremity positive for 1+ pitting edema.  Impression/Plan: 1.         76 year-old gentleman with Stage IIIA ([id number]) adenocarcinoma of the right middle lobe of lung.  Dr. Tammi Klippel met with the patient and family about the findings and work-up thus far.  We discussed the natural history of adenocarcinoma of the lung regarding general treatment, highlighting the role of radiotherapy in the management.  We discussed the available radiation techniques, and focused on the details of logistics and delivery.  We reviewed the anticipated acute and late sequelae associated with radiation in this setting.  The patient was encouraged to ask questions that we answered to the best of our ability. Dr. Tammi Klippel anticipates a course of 6 1/2  weeks radiation treatment to the right chest. The patient would like to proceed with radiation and will be scheduled for CT simulation. Written consent was obtained, and the patient will move forward with simulation today, and also begin chemo today as well. 2. Multiple cerebral infarcts. The patient has not been seen by neurology and we will coordinate  him to be  referred for evaluation. He appears to be asymptomatic however. 3. Pubic fracture.   The above documentation reflects my direct findings during this shared patient visit. Please see the separate note by Dr. Tammi Klippel on this date for the remainder of the patient's plan of care.     Carola Rhine, PAC  &  ------------------------------------------------   Tyler Pita, MD [facility] Medical Director and Director of Stereotactic Radiosurgery Direct Dial: [phone]  Fax: [phone] [email or web address]  Skype  [email or web address]  This document serves as a record of services personally performed by Tyler Pita, MD and Shona Simpson, PA-C. It was created on their behalf by Arlyce Harman, a trained medical scribe. The creation of this record is based on the scribe's personal observations and the provider's statements to them. This document has been checked and approved by the attending provider.

## 2016-10-17 NOTE — Progress Notes (Signed)
Thoracic Location of Tumor / Histology: Non small cell lung cancer  The patient was on a ladder working at his house when he fell down from 20 feet height. He was evaluated at the emergency department at [facility] and CT scan of the head and neck were performed on 08/22/2016. CT of the head without contrast showed abnormal anterior left frontal lobe with a suspicious for 2 cm brain mass with mild surrounding vasogenic edema but MRI of the brain was recommended which was later performed on 09/02/2016 and showed the left frontal lesion favoring a subacute to chronic hemorrhagic infarct or cortical confusion but malignancy is not completely excluded. During his evaluation at [facility] CT scan of the chest, abdomen and pelvis were performed on 08/22/2016 and it showed a lobe with soft tissue stranding into the periphery, consistent with primary carcinoma of the lung. There is a vague the 0.7 cm area of abnormal density in the posterior aspect of the left upper lobe and 2 small nodular densities in the right upper lobe measuring 0.4 and 0.2 cm. The scan also showed 3.7 x 2.3 cm enlarging subcarinal lymph node.    Tobacco/Marijuana/Snuff/ETOH use: Former smoker. Quit 1994.  Past/Anticipated interventions by cardiothoracic surgery, if any: no  Past/Anticipated interventions by medical oncology, if any: If there is no evidence of metastatic disease Mohamed recommends a course of concurrent chemoradiation with weekly carboplatin for AUC of 2 and paclitaxel 45 MG/M2.   Signs/Symptoms  Weight changes, if any: 17 lb in the last few weeks. Referred to [facility] by Dr. Julien Nordmann  Respiratory complaints, if any: Denies chest pain or shortness of breath. Reports a mild productive cough.  Hemoptysis, if any: Denies  Pain issues, if any:  No,  Left hip at times,, 8 weeks out surgery   SAFETY ISSUES: Yes, in w/c , unable to stand on left leg  Prior radiation? No  Pacemaker/ICD?  NO  Possible current pregnancy?no  Is the patient on methotrexate? NO  Current Complaints / other details:  76 year old male. Married. Complains of weakness in lower extremities worse on the left. Suffers from urinary retention thus, wears a foley catheter. Denies headache or visual changes.  MRI 4/28 18 IMPRESSION: No definite intracranial metastatic disease is observed.  Continued evolution of the hemorrhagic LEFT frontal infarct, without concerning features for LEFT frontal metastasis.  Atrophy and small vessel disease.  Acute on subacute watershed infarcts continue to develop in the RIGHT hemisphere. There is concern for proximal high-grade stenosis. An embolic source is less favored. Consider CTA head neck for further evaluation.

## 2016-10-18 NOTE — Telephone Encounter (Signed)
"  [facility] [phone] calling back in reference to interaction with Compazine and potasium.  He has run out of potassium prescribed by rehab with [facility] hospitalization.  Does he need a refill and who do we call for this refill?    Will notify Dr. Julien Nordmann of these requests.  [facility] "will ask PCP. Of refill request.and wait to hear from your office.  If patient does not need potassium.  The interaction is no longer valid."

## 2016-10-19 ENCOUNTER — Telehealth: Payer: Self-pay | Admitting: Family Medicine

## 2016-10-19 DIAGNOSIS — Z510 Encounter for antineoplastic radiation therapy: Secondary | ICD-10-CM | POA: Diagnosis not present

## 2016-10-19 DIAGNOSIS — I739 Peripheral vascular disease, unspecified: Secondary | ICD-10-CM | POA: Diagnosis not present

## 2016-10-19 DIAGNOSIS — I4891 Unspecified atrial fibrillation: Secondary | ICD-10-CM | POA: Diagnosis not present

## 2016-10-19 DIAGNOSIS — I129 Hypertensive chronic kidney disease with stage 1 through stage 4 chronic kidney disease, or unspecified chronic kidney disease: Secondary | ICD-10-CM | POA: Diagnosis not present

## 2016-10-19 DIAGNOSIS — S329XXD Fracture of unspecified parts of lumbosacral spine and pelvis, subsequent encounter for fracture with routine healing: Secondary | ICD-10-CM | POA: Diagnosis not present

## 2016-10-19 DIAGNOSIS — N390 Urinary tract infection, site not specified: Secondary | ICD-10-CM | POA: Diagnosis not present

## 2016-10-19 DIAGNOSIS — E039 Hypothyroidism, unspecified: Secondary | ICD-10-CM | POA: Diagnosis not present

## 2016-10-19 DIAGNOSIS — I82409 Acute embolism and thrombosis of unspecified deep veins of unspecified lower extremity: Secondary | ICD-10-CM | POA: Diagnosis not present

## 2016-10-19 DIAGNOSIS — C8521 Mediastinal (thymic) large B-cell lymphoma, lymph nodes of head, face, and neck: Secondary | ICD-10-CM | POA: Diagnosis not present

## 2016-10-19 DIAGNOSIS — N189 Chronic kidney disease, unspecified: Secondary | ICD-10-CM | POA: Diagnosis not present

## 2016-10-19 DIAGNOSIS — I2510 Atherosclerotic heart disease of native coronary artery without angina pectoris: Secondary | ICD-10-CM | POA: Diagnosis not present

## 2016-10-19 DIAGNOSIS — C342 Malignant neoplasm of middle lobe, bronchus or lung: Secondary | ICD-10-CM | POA: Diagnosis not present

## 2016-10-19 NOTE — Telephone Encounter (Signed)
Received a phone call from [facility] on patient's INR that was drawn today via fingerstick. INR was 8.0. She has redrawn venous INR to verify results. Please advise? We may call her [phone]

## 2016-10-19 NOTE — Telephone Encounter (Signed)
Hopefully you told them we were

## 2016-10-19 NOTE — Telephone Encounter (Signed)
Dr.Steve is following up with patient' coumadin

## 2016-10-19 NOTE — Telephone Encounter (Signed)
A staff member with Dr.Nishan wanted to follow up to ensure Dr.steve is follow up with patient's coumadin

## 2016-10-19 NOTE — Telephone Encounter (Signed)
Discussed with Dr.Steve and was told to have patient hold coumadin today, and tomorrow and recheck INR in 48 hours. If any signs of bleeding patient needs to go to the ER. Verdis Frederickson verbalized understanding.

## 2016-10-20 ENCOUNTER — Ambulatory Visit (INDEPENDENT_AMBULATORY_CARE_PROVIDER_SITE_OTHER): Payer: Medicare Other | Admitting: Family Medicine

## 2016-10-20 ENCOUNTER — Telehealth: Payer: Self-pay | Admitting: Medical Oncology

## 2016-10-20 ENCOUNTER — Encounter: Payer: Self-pay | Admitting: Family Medicine

## 2016-10-20 VITALS — BP 100/54 | Ht 67.0 in | Wt 146.5 lb

## 2016-10-20 DIAGNOSIS — C342 Malignant neoplasm of middle lobe, bronchus or lung: Secondary | ICD-10-CM

## 2016-10-20 DIAGNOSIS — I82433 Acute embolism and thrombosis of popliteal vein, bilateral: Secondary | ICD-10-CM

## 2016-10-20 DIAGNOSIS — I6529 Occlusion and stenosis of unspecified carotid artery: Secondary | ICD-10-CM

## 2016-10-20 DIAGNOSIS — I10 Essential (primary) hypertension: Secondary | ICD-10-CM | POA: Diagnosis not present

## 2016-10-20 MED ORDER — POTASSIUM CHLORIDE CRYS ER 20 MEQ PO TBCR: 20.0000 meq | EXTENDED_RELEASE_TABLET | Freq: Every day | ORAL | 11 refills | Status: DC

## 2016-10-20 NOTE — Progress Notes (Signed)
Subjective:    Patient ID: [name] [name], male    DOB: [birth year], 76 y.o.   MRN: [id number]  HPI Patient in today for a hospital follow up for Hyponatremia, Acute Cystitis and generalized weakness. Patient was in [facility] (see ER note 10/03/16)  Had recent INR was 8.0  States no other concerns this visit.   Results for orders placed or performed in visit on 10/17/16  CBC with Differential/Platelet  Result Value Ref Range   WBC 7.4 4.0 - 10.3 10e3/uL   NEUT# 6.0 1.5 - 6.5 10e3/uL   HGB 12.0 (L) 13.0 - 17.1 g/dL   HCT 34.7 (L) 38.4 - 49.9 %   Platelets 222 140 - 400 10e3/uL   MCV 84.8 79.3 - 98.0 fL   MCH 29.3 27.2 - 33.4 pg   MCHC 34.5 32.0 - 36.0 g/dL   RBC 4.09 (L) 4.20 - 5.82 10e6/uL   RDW 15.7 (H) 11.0 - 14.6 %   lymph# 1.0 0.9 - 3.3 10e3/uL   MONO# 0.3 0.1 - 0.9 10e3/uL   Eosinophils Absolute 0.1 0.0 - 0.5 10e3/uL   Basophils Absolute 0.1 0.0 - 0.1 10e3/uL   NEUT% 80.9 (H) 39.0 - 75.0 %   LYMPH% 12.8 (L) 14.0 - 49.0 %   MONO% 4.6 0.0 - 14.0 %   EOS% 1.0 0.0 - 7.0 %   BASO% 0.7 0.0 - 2.0 %  Comprehensive metabolic panel  Result Value Ref Range   Sodium 133 (L) 136 - 145 mEq/L   Potassium 3.8 3.5 - 5.1 mEq/L   Chloride 99 98 - 109 mEq/L   CO2 24 22 - 29 mEq/L   Glucose 128 70 - 140 mg/dl   BUN 14.4 7.0 - 26.0 mg/dL   Creatinine 0.7 0.7 - 1.3 mg/dL   Total Bilirubin 0.42 0.20 - 1.20 mg/dL   Alkaline Phosphatase 106 40 - 150 U/L   AST 23 5 - 34 U/L   ALT 23 0 - 55 U/L   Total Protein 6.4 6.4 - 8.3 g/dL   Albumin 3.0 (L) 3.5 - 5.0 g/dL   Calcium 9.1 8.4 - 10.4 mg/dL   Anion Gap 11 3 - 11 mEq/L   EGFR 89 (L) >90 ml/min/1.73 m2  Actually patient has gone through quite a bit since I last saw him. Next  Full hospital records reviewed. Next  Took a fall suffered a pelvic fracture. Still having residual pain. Follow-up with orthopedist  Developed DVTs. Now on Coumadin.  Has known atrial fibrillation, another rationale for ongoing Coumadin.  Recent low  potassium. Was on supplement then came off wonders if he needs to remain.  Recent diagnosis of lung cancer. Oncologist notes reviewed.  Review of Systems No headache, no major weight loss or weight gain, no chest pain no back pain abdominal pain no change in bowel habits complete ROS otherwise negative     Objective:   Physical Exam Alert and oriented, vitals reviewed and stable, NAD ENT-TM's and ext canals WNL bilat via otoscopic exam Soft palate, tonsils and post pharynx WNL via oropharyngeal exam Neck-symmetric, no masses; thyroid nonpalpable and nontender Pulmonary-no tachypnea or accessory muscle use; Clear without wheezes via auscultation Card--no abnrml murmurs, rhythm Not regular but controlled rate Carotid pulses symmetric, without bruits Ankles no significant edema       Assessment & Plan:  Impression #1 hypokalemia discuss should resume potassium, and maintain long-term rationale discussed #2 lung cancer discuss fortunately most recent MRI of brain points away from  brain cancer. Still this is very substantial and serious and patient is fully aware of that #3 anticoagulation INR substantially elevated yesterday at 8 we will give beginning another level tomorrow #4 hypertension decent control #5 coronary artery disease clinically stable #6 anticoagulation discussed follow-up in 2 months

## 2016-10-20 NOTE — Telephone Encounter (Signed)
I ft message to call back for chemo f/u .

## 2016-10-21 ENCOUNTER — Telehealth: Payer: Self-pay | Admitting: Family Medicine

## 2016-10-21 ENCOUNTER — Encounter: Payer: Self-pay | Admitting: Neurology

## 2016-10-21 ENCOUNTER — Telehealth: Payer: Self-pay

## 2016-10-21 ENCOUNTER — Ambulatory Visit (HOSPITAL_COMMUNITY): Payer: Medicare Other

## 2016-10-21 DIAGNOSIS — N390 Urinary tract infection, site not specified: Secondary | ICD-10-CM | POA: Diagnosis not present

## 2016-10-21 DIAGNOSIS — C8521 Mediastinal (thymic) large B-cell lymphoma, lymph nodes of head, face, and neck: Secondary | ICD-10-CM | POA: Diagnosis not present

## 2016-10-21 DIAGNOSIS — I82409 Acute embolism and thrombosis of unspecified deep veins of unspecified lower extremity: Secondary | ICD-10-CM | POA: Diagnosis not present

## 2016-10-21 DIAGNOSIS — I4891 Unspecified atrial fibrillation: Secondary | ICD-10-CM | POA: Diagnosis not present

## 2016-10-21 DIAGNOSIS — I739 Peripheral vascular disease, unspecified: Secondary | ICD-10-CM | POA: Diagnosis not present

## 2016-10-21 DIAGNOSIS — S329XXD Fracture of unspecified parts of lumbosacral spine and pelvis, subsequent encounter for fracture with routine healing: Secondary | ICD-10-CM | POA: Diagnosis not present

## 2016-10-21 NOTE — Telephone Encounter (Signed)
Verdis Frederickson, nurse with [facility] called to give pt's INR results  2.5  Please advise  [facility] - [phone]

## 2016-10-21 NOTE — Telephone Encounter (Signed)
Spoke with home health nurse Verdis Frederickson with [facility] and advised her per Dr.Steve Luking- Patient to take 1.5 tablets (7.'5MG'$  Coumadin) all days recheck on 10/26/16. Verdis Frederickson verbalized understanding.

## 2016-10-21 NOTE — Telephone Encounter (Signed)
One and a half tab every day then re ck inr on wed or thur

## 2016-10-21 NOTE — Telephone Encounter (Signed)
Wife returned chemo f/u call. Pt is doing well except for constipation. s/w her about sennacot-S and miralax and linzess, if no results by tomorrow to use mag citrate. His LBM was 3 days ago. [facility] RN also s/w them on this issue. Encouraged 4 bottles of water /day.

## 2016-10-24 ENCOUNTER — Ambulatory Visit
Admission: RE | Admit: 2016-10-24 | Discharge: 2016-10-24 | Disposition: A | Discharge status: Home / Self Care | Payer: Medicare Other | Source: Ambulatory Visit | Attending: Radiation Oncology | Admitting: Radiation Oncology

## 2016-10-24 ENCOUNTER — Ambulatory Visit: Payer: Medicare Other

## 2016-10-24 ENCOUNTER — Ambulatory Visit (HOSPITAL_BASED_OUTPATIENT_CLINIC_OR_DEPARTMENT_OTHER): Payer: Medicare Other

## 2016-10-24 ENCOUNTER — Institutional Professional Consult (permissible substitution): Payer: Medicare Other | Admitting: Radiation Oncology

## 2016-10-24 ENCOUNTER — Other Ambulatory Visit (HOSPITAL_BASED_OUTPATIENT_CLINIC_OR_DEPARTMENT_OTHER): Payer: Medicare Other

## 2016-10-24 VITALS — BP 121/44 | HR 68 | Resp 16

## 2016-10-24 DIAGNOSIS — Z5111 Encounter for antineoplastic chemotherapy: Secondary | ICD-10-CM

## 2016-10-24 DIAGNOSIS — E039 Hypothyroidism, unspecified: Secondary | ICD-10-CM | POA: Diagnosis not present

## 2016-10-24 DIAGNOSIS — C342 Malignant neoplasm of middle lobe, bronchus or lung: Secondary | ICD-10-CM | POA: Diagnosis not present

## 2016-10-24 DIAGNOSIS — C3411 Malignant neoplasm of upper lobe, right bronchus or lung: Secondary | ICD-10-CM

## 2016-10-24 DIAGNOSIS — N189 Chronic kidney disease, unspecified: Secondary | ICD-10-CM | POA: Diagnosis not present

## 2016-10-24 DIAGNOSIS — C3491 Malignant neoplasm of unspecified part of right bronchus or lung: Secondary | ICD-10-CM

## 2016-10-24 DIAGNOSIS — Z510 Encounter for antineoplastic radiation therapy: Secondary | ICD-10-CM | POA: Diagnosis not present

## 2016-10-24 DIAGNOSIS — I129 Hypertensive chronic kidney disease with stage 1 through stage 4 chronic kidney disease, or unspecified chronic kidney disease: Secondary | ICD-10-CM | POA: Diagnosis not present

## 2016-10-24 DIAGNOSIS — I2510 Atherosclerotic heart disease of native coronary artery without angina pectoris: Secondary | ICD-10-CM | POA: Diagnosis not present

## 2016-10-24 LAB — CBC WITH DIFFERENTIAL/PLATELET
BASO%: 0.5 % (ref 0.0–2.0)
Basophils Absolute: 0 10*3/uL (ref 0.0–0.1)
EOS%: 0.7 % (ref 0.0–7.0)
Eosinophils Absolute: 0 10*3/uL (ref 0.0–0.5)
HCT: 33.2 % — ABNORMAL LOW (ref 38.4–49.9)
HGB: 11.2 g/dL — ABNORMAL LOW (ref 13.0–17.1)
LYMPH%: 21.4 % (ref 14.0–49.0)
MCH: 29 pg (ref 27.2–33.4)
MCHC: 33.7 g/dL (ref 32.0–36.0)
MCV: 86 fL (ref 79.3–98.0)
MONO#: 0.3 10*3/uL (ref 0.1–0.9)
MONO%: 6.1 % (ref 0.0–14.0)
NEUT%: 71.3 % (ref 39.0–75.0)
NEUTROS ABS: 2.9 10*3/uL (ref 1.5–6.5)
PLATELETS: 158 10*3/uL (ref 140–400)
RBC: 3.86 10*6/uL — AB (ref 4.20–5.82)
RDW: 15.8 % — ABNORMAL HIGH (ref 11.0–14.6)
WBC: 4.1 10*3/uL (ref 4.0–10.3)
lymph#: 0.9 10*3/uL (ref 0.9–3.3)

## 2016-10-24 LAB — COMPREHENSIVE METABOLIC PANEL
ALT: 20 U/L (ref 0–55)
AST: 24 U/L (ref 5–34)
Albumin: 3.1 g/dL — ABNORMAL LOW (ref 3.5–5.0)
Alkaline Phosphatase: 99 U/L (ref 40–150)
Anion Gap: 10 mEq/L (ref 3–11)
BILIRUBIN TOTAL: 0.44 mg/dL (ref 0.20–1.20)
BUN: 11.8 mg/dL (ref 7.0–26.0)
CO2: 25 meq/L (ref 22–29)
CREATININE: 0.7 mg/dL (ref 0.7–1.3)
Calcium: 9.1 mg/dL (ref 8.4–10.4)
Chloride: 97 mEq/L — ABNORMAL LOW (ref 98–109)
EGFR: 89 mL/min/{1.73_m2} — ABNORMAL LOW (ref >90)
GLUCOSE: 102 mg/dL (ref 70–140)
Potassium: 4.1 mEq/L (ref 3.5–5.1)
SODIUM: 133 meq/L — AB (ref 136–145)
TOTAL PROTEIN: 6.4 g/dL (ref 6.4–8.3)

## 2016-10-24 MED ORDER — DIPHENHYDRAMINE HCL 50 MG/ML IJ SOLN
INTRAMUSCULAR | Status: AC
  Filled 2016-10-24: qty 1

## 2016-10-24 MED ORDER — SODIUM CHLORIDE 0.9 % IV SOLN
160.0000 mg | Freq: Once | INTRAVENOUS | Status: AC
  Administered 2016-10-24: 160 mg via INTRAVENOUS
  Filled 2016-10-24: qty 16

## 2016-10-24 MED ORDER — FAMOTIDINE IN NACL 20-0.9 MG/50ML-% IV SOLN
20.0000 mg | Freq: Once | INTRAVENOUS | Status: AC
  Administered 2016-10-24: 20 mg via INTRAVENOUS

## 2016-10-24 MED ORDER — PALONOSETRON HCL INJECTION 0.25 MG/5ML
INTRAVENOUS | Status: AC
  Filled 2016-10-24: qty 5

## 2016-10-24 MED ORDER — PACLITAXEL CHEMO INJECTION 300 MG/50ML
45.0000 mg/m2 | Freq: Once | INTRAVENOUS | Status: AC
  Administered 2016-10-24: 78 mg via INTRAVENOUS
  Filled 2016-10-24: qty 13

## 2016-10-24 MED ORDER — DIPHENHYDRAMINE HCL 50 MG/ML IJ SOLN
50.0000 mg | Freq: Once | INTRAMUSCULAR | Status: AC
  Administered 2016-10-24: 50 mg via INTRAVENOUS

## 2016-10-24 MED ORDER — FAMOTIDINE IN NACL 20-0.9 MG/50ML-% IV SOLN
INTRAVENOUS | Status: AC
  Filled 2016-10-24: qty 50

## 2016-10-24 MED ORDER — SODIUM CHLORIDE 0.9 % IV SOLN
Freq: Once | INTRAVENOUS | Status: AC
  Administered 2016-10-24: 14:00:00 via INTRAVENOUS

## 2016-10-24 MED ORDER — PALONOSETRON HCL INJECTION 0.25 MG/5ML
0.2500 mg | Freq: Once | INTRAVENOUS | Status: AC
  Administered 2016-10-24: 0.25 mg via INTRAVENOUS

## 2016-10-24 MED ORDER — DEXAMETHASONE SODIUM PHOSPHATE 100 MG/10ML IJ SOLN
20.0000 mg | Freq: Once | INTRAMUSCULAR | Status: AC
  Administered 2016-10-24: 20 mg via INTRAVENOUS
  Filled 2016-10-24: qty 2

## 2016-10-24 NOTE — Patient Instructions (Signed)
[facility] Discharge Instructions for Patients Receiving Chemotherapy  Today you received the following chemotherapy agents Taxol /Carboplatin.  To help prevent nausea and vomiting after your treatment, we encourage you to take your nausea medication as needed   If you develop nausea and vomiting that is not controlled by your nausea medication, call the clinic.   BELOW ARE SYMPTOMS THAT SHOULD BE REPORTED IMMEDIATELY:  *FEVER GREATER THAN 100.5 F  *CHILLS WITH OR WITHOUT FEVER  NAUSEA AND VOMITING THAT IS NOT CONTROLLED WITH YOUR NAUSEA MEDICATION  *UNUSUAL SHORTNESS OF BREATH  *UNUSUAL BRUISING OR BLEEDING  TENDERNESS IN MOUTH AND THROAT WITH OR WITHOUT PRESENCE OF ULCERS  *URINARY PROBLEMS  *BOWEL PROBLEMS  UNUSUAL RASH Items with * indicate a potential emergency and should be followed up as soon as possible.  Feel free to call the clinic you have any questions or concerns. The clinic phone number is [phone].  Please show the CHEMO ALERT CARD at check-in to the Emergency Department and triage nurse.   

## 2016-10-25 ENCOUNTER — Ambulatory Visit
Admission: RE | Admit: 2016-10-25 | Discharge: 2016-10-25 | Disposition: A | Discharge status: Home / Self Care | Payer: Medicare Other | Source: Ambulatory Visit | Attending: Radiation Oncology | Admitting: Radiation Oncology

## 2016-10-25 DIAGNOSIS — I82409 Acute embolism and thrombosis of unspecified deep veins of unspecified lower extremity: Secondary | ICD-10-CM | POA: Diagnosis not present

## 2016-10-25 DIAGNOSIS — I2510 Atherosclerotic heart disease of native coronary artery without angina pectoris: Secondary | ICD-10-CM | POA: Diagnosis not present

## 2016-10-25 DIAGNOSIS — C342 Malignant neoplasm of middle lobe, bronchus or lung: Secondary | ICD-10-CM | POA: Diagnosis not present

## 2016-10-25 DIAGNOSIS — N390 Urinary tract infection, site not specified: Secondary | ICD-10-CM | POA: Diagnosis not present

## 2016-10-25 DIAGNOSIS — I739 Peripheral vascular disease, unspecified: Secondary | ICD-10-CM | POA: Diagnosis not present

## 2016-10-25 DIAGNOSIS — I4891 Unspecified atrial fibrillation: Secondary | ICD-10-CM | POA: Diagnosis not present

## 2016-10-25 DIAGNOSIS — I129 Hypertensive chronic kidney disease with stage 1 through stage 4 chronic kidney disease, or unspecified chronic kidney disease: Secondary | ICD-10-CM | POA: Diagnosis not present

## 2016-10-25 DIAGNOSIS — S329XXD Fracture of unspecified parts of lumbosacral spine and pelvis, subsequent encounter for fracture with routine healing: Secondary | ICD-10-CM | POA: Diagnosis not present

## 2016-10-25 DIAGNOSIS — C8521 Mediastinal (thymic) large B-cell lymphoma, lymph nodes of head, face, and neck: Secondary | ICD-10-CM | POA: Diagnosis not present

## 2016-10-25 DIAGNOSIS — Z510 Encounter for antineoplastic radiation therapy: Secondary | ICD-10-CM | POA: Diagnosis not present

## 2016-10-25 DIAGNOSIS — N189 Chronic kidney disease, unspecified: Secondary | ICD-10-CM | POA: Diagnosis not present

## 2016-10-25 DIAGNOSIS — E039 Hypothyroidism, unspecified: Secondary | ICD-10-CM | POA: Diagnosis not present

## 2016-10-26 ENCOUNTER — Telehealth: Payer: Self-pay | Admitting: *Deleted

## 2016-10-26 ENCOUNTER — Ambulatory Visit
Admission: RE | Admit: 2016-10-26 | Discharge: 2016-10-26 | Disposition: A | Discharge status: Home / Self Care | Payer: Medicare Other | Source: Ambulatory Visit | Attending: Radiation Oncology | Admitting: Radiation Oncology

## 2016-10-26 DIAGNOSIS — I129 Hypertensive chronic kidney disease with stage 1 through stage 4 chronic kidney disease, or unspecified chronic kidney disease: Secondary | ICD-10-CM | POA: Diagnosis not present

## 2016-10-26 DIAGNOSIS — N390 Urinary tract infection, site not specified: Secondary | ICD-10-CM | POA: Diagnosis not present

## 2016-10-26 DIAGNOSIS — C8521 Mediastinal (thymic) large B-cell lymphoma, lymph nodes of head, face, and neck: Secondary | ICD-10-CM | POA: Diagnosis not present

## 2016-10-26 DIAGNOSIS — I4891 Unspecified atrial fibrillation: Secondary | ICD-10-CM | POA: Diagnosis not present

## 2016-10-26 DIAGNOSIS — Z510 Encounter for antineoplastic radiation therapy: Secondary | ICD-10-CM | POA: Diagnosis not present

## 2016-10-26 DIAGNOSIS — I739 Peripheral vascular disease, unspecified: Secondary | ICD-10-CM | POA: Diagnosis not present

## 2016-10-26 DIAGNOSIS — E039 Hypothyroidism, unspecified: Secondary | ICD-10-CM | POA: Diagnosis not present

## 2016-10-26 DIAGNOSIS — C342 Malignant neoplasm of middle lobe, bronchus or lung: Secondary | ICD-10-CM | POA: Diagnosis not present

## 2016-10-26 DIAGNOSIS — N189 Chronic kidney disease, unspecified: Secondary | ICD-10-CM | POA: Diagnosis not present

## 2016-10-26 DIAGNOSIS — I2510 Atherosclerotic heart disease of native coronary artery without angina pectoris: Secondary | ICD-10-CM | POA: Diagnosis not present

## 2016-10-26 DIAGNOSIS — I82409 Acute embolism and thrombosis of unspecified deep veins of unspecified lower extremity: Secondary | ICD-10-CM | POA: Diagnosis not present

## 2016-10-26 DIAGNOSIS — S329XXD Fracture of unspecified parts of lumbosacral spine and pelvis, subsequent encounter for fracture with routine healing: Secondary | ICD-10-CM | POA: Diagnosis not present

## 2016-10-26 NOTE — Telephone Encounter (Signed)
[name] [name] from home health called with INR result 2.1. Patient currently on Coumadin '5mg'$   1.5 tablets daily. Consult with Dr Richardson Landry increase to 2 tablets on Wednesday and 1.5 tablets all other days and recheck INR in 2 weeks via home health.

## 2016-10-26 NOTE — Telephone Encounter (Signed)
good

## 2016-10-27 ENCOUNTER — Ambulatory Visit
Admission: RE | Admit: 2016-10-27 | Discharge: 2016-10-27 | Disposition: A | Discharge status: Home / Self Care | Payer: Medicare Other | Source: Ambulatory Visit | Attending: Radiation Oncology | Admitting: Radiation Oncology

## 2016-10-27 DIAGNOSIS — I2510 Atherosclerotic heart disease of native coronary artery without angina pectoris: Secondary | ICD-10-CM | POA: Diagnosis not present

## 2016-10-27 DIAGNOSIS — E039 Hypothyroidism, unspecified: Secondary | ICD-10-CM | POA: Diagnosis not present

## 2016-10-27 DIAGNOSIS — Z510 Encounter for antineoplastic radiation therapy: Secondary | ICD-10-CM | POA: Diagnosis not present

## 2016-10-27 DIAGNOSIS — I129 Hypertensive chronic kidney disease with stage 1 through stage 4 chronic kidney disease, or unspecified chronic kidney disease: Secondary | ICD-10-CM | POA: Diagnosis not present

## 2016-10-27 DIAGNOSIS — N189 Chronic kidney disease, unspecified: Secondary | ICD-10-CM | POA: Diagnosis not present

## 2016-10-27 DIAGNOSIS — R338 Other retention of urine: Secondary | ICD-10-CM | POA: Diagnosis not present

## 2016-10-27 DIAGNOSIS — C342 Malignant neoplasm of middle lobe, bronchus or lung: Secondary | ICD-10-CM | POA: Diagnosis not present

## 2016-10-28 ENCOUNTER — Emergency Department (HOSPITAL_COMMUNITY)
Admission: EM | Admit: 2016-10-28 | Discharge: 2016-10-28 | Disposition: A | Discharge status: Home / Self Care | Payer: Medicare Other | Attending: Emergency Medicine | Admitting: Emergency Medicine

## 2016-10-28 ENCOUNTER — Ambulatory Visit
Admission: RE | Admit: 2016-10-28 | Discharge: 2016-10-28 | Disposition: A | Discharge status: Home / Self Care | Payer: Medicare Other | Source: Ambulatory Visit | Attending: Radiation Oncology | Admitting: Radiation Oncology

## 2016-10-28 DIAGNOSIS — Z96651 Presence of right artificial knee joint: Secondary | ICD-10-CM | POA: Diagnosis not present

## 2016-10-28 DIAGNOSIS — I129 Hypertensive chronic kidney disease with stage 1 through stage 4 chronic kidney disease, or unspecified chronic kidney disease: Secondary | ICD-10-CM | POA: Insufficient documentation

## 2016-10-28 DIAGNOSIS — R339 Retention of urine, unspecified: Secondary | ICD-10-CM | POA: Diagnosis not present

## 2016-10-28 DIAGNOSIS — Z87891 Personal history of nicotine dependence: Secondary | ICD-10-CM | POA: Insufficient documentation

## 2016-10-28 DIAGNOSIS — I2510 Atherosclerotic heart disease of native coronary artery without angina pectoris: Secondary | ICD-10-CM | POA: Insufficient documentation

## 2016-10-28 DIAGNOSIS — N189 Chronic kidney disease, unspecified: Secondary | ICD-10-CM | POA: Diagnosis not present

## 2016-10-28 DIAGNOSIS — N390 Urinary tract infection, site not specified: Secondary | ICD-10-CM | POA: Diagnosis not present

## 2016-10-28 DIAGNOSIS — I252 Old myocardial infarction: Secondary | ICD-10-CM | POA: Diagnosis not present

## 2016-10-28 DIAGNOSIS — S329XXD Fracture of unspecified parts of lumbosacral spine and pelvis, subsequent encounter for fracture with routine healing: Secondary | ICD-10-CM | POA: Diagnosis not present

## 2016-10-28 DIAGNOSIS — C342 Malignant neoplasm of middle lobe, bronchus or lung: Secondary | ICD-10-CM

## 2016-10-28 DIAGNOSIS — Z951 Presence of aortocoronary bypass graft: Secondary | ICD-10-CM | POA: Diagnosis not present

## 2016-10-28 DIAGNOSIS — I82409 Acute embolism and thrombosis of unspecified deep veins of unspecified lower extremity: Secondary | ICD-10-CM | POA: Diagnosis not present

## 2016-10-28 DIAGNOSIS — Z79899 Other long term (current) drug therapy: Secondary | ICD-10-CM | POA: Diagnosis not present

## 2016-10-28 DIAGNOSIS — C8521 Mediastinal (thymic) large B-cell lymphoma, lymph nodes of head, face, and neck: Secondary | ICD-10-CM | POA: Diagnosis not present

## 2016-10-28 DIAGNOSIS — E039 Hypothyroidism, unspecified: Secondary | ICD-10-CM | POA: Diagnosis not present

## 2016-10-28 DIAGNOSIS — Z7901 Long term (current) use of anticoagulants: Secondary | ICD-10-CM | POA: Insufficient documentation

## 2016-10-28 DIAGNOSIS — R338 Other retention of urine: Secondary | ICD-10-CM | POA: Diagnosis not present

## 2016-10-28 DIAGNOSIS — Z510 Encounter for antineoplastic radiation therapy: Secondary | ICD-10-CM | POA: Diagnosis not present

## 2016-10-28 DIAGNOSIS — I739 Peripheral vascular disease, unspecified: Secondary | ICD-10-CM | POA: Diagnosis not present

## 2016-10-28 DIAGNOSIS — I4891 Unspecified atrial fibrillation: Secondary | ICD-10-CM | POA: Diagnosis not present

## 2016-10-28 LAB — URINALYSIS, ROUTINE W REFLEX MICROSCOPIC
Bilirubin Urine: NEGATIVE
GLUCOSE, UA: NEGATIVE mg/dL
Ketones, ur: NEGATIVE mg/dL
Leukocytes, UA: NEGATIVE
NITRITE: NEGATIVE
Protein, ur: NEGATIVE mg/dL
SPECIFIC GRAVITY, URINE: 1.008 (ref 1.005–1.030)
Squamous Epithelial / HPF: NONE SEEN
pH: 7 (ref 5.0–8.0)

## 2016-10-28 MED ORDER — RADIAPLEXRX EX GEL
Freq: Once | CUTANEOUS | Status: AC
  Administered 2016-10-28: 11:00:00 via TOPICAL

## 2016-10-28 NOTE — ED Provider Notes (Signed)
[facility] DEPT Provider Note   CSN: [id number] Arrival date & time: 10/28/16  0115     History   Chief Complaint Chief Complaint  Patient presents with  . Urinary Retention    HPI [name] [name] is a 76 y.o. male.  Patient presents to the emergency department for evaluation of urinary retention. Patient has had long-term Foley catheter placement secondary to previous pelvic surgery after trauma. Patient was seen by urology earlier today and had his catheter removed. He was able to pass urine in the office, however, since going home he has had decreasing amounts of urine output. For the last several hours has been unable to urinate. Patient complaining of constant pain in the area of his bladder.      Past Medical History:  Diagnosis Date  . Adenocarcinoma of right lung, stage 3 ([place]) 09/20/2016  . Atrial fibrillation ([place])    no hx of reported at preop visit of 04/21/11   . CAD (coronary artery disease)   . Carotid artery occlusion    left carotid endarterectomy  . Chronic kidney disease    AAA repair with reimplant of renals   . CHRONIC OBSTRUCTIVE PULMONARY DISEASE    pt denied at visit of 04/21/11   . CORONARY ARTERY DISEASE   . Disorder of left sacroiliac joint 08/24/2016  . DIVERTICULOSIS OF COLON   . Encounter for antineoplastic chemotherapy 10/17/2016  . GERD   . H/O hiatal hernia   . Headache(784.0)    Hx: of years of years  . HYPERTENSION   . HYPOTHYROIDISM   . JOINT EFFUSION, KNEE   . KNEE, ARTHRITIS, DEGEN./OSTEO    right knee   . Myocardial infarction ([place])    1994   . NEPHROLITHIASIS, HX OF   . OSTEOPOROSIS   . Other dysphagia   . PERIPHERAL VASCULAR DISEASE    AAA - 1994 with reimplant of renals   . Peripheral vascular disease ([place])    subclavian stenosis PTA - 3/08   . Pure hypercholesterolemia   . Renal artery stenosis [facility])     Patient Active Problem List   Diagnosis Date Noted  . Encounter for antineoplastic chemotherapy 10/17/2016  .  Malnutrition of moderate degree 10/06/2016  . Urinary tract infection 10/03/2016  . T1b N2 M0 disease (stage IIIA).adenocarcinoma of the right middle lobe of lung ([place]) 09/20/2016  . Peripheral edema   . Slow transit constipation   . Acute lower UTI   . Confusion   . SOB (shortness of breath) 09/02/2016  . Urinary incontinence   . Abnormal urinalysis   . Leukocytosis   . HCAP (healthcare-associated pneumonia)   . Hypoalbuminemia due to protein-calorie malnutrition ([place])   . Hypokalemia   . Hyponatremia   . Urinary retention   . Sleep disturbance   . Acute bilateral deep vein thrombosis (DVT) of popliteal veins ([facility])   . Brain mass 08/31/2016  . Debility   . Benign essential HTN   . Scrotal edema   . Hyperlipidemia   . Constipation due to pain medication   . Fall from height of greater than 3 feet   . Acute blood loss anemia   . Lung mass   . Post-operative pain   . PVD (peripheral vascular disease) ([place])   . Coronary artery disease involving coronary bypass graft of native heart without angina pectoris   . PAF (paroxysmal atrial fibrillation) ([place])   . Disorder of left sacroiliac joint 08/24/2016  . Fall 08/22/2016  . Closed  pelvic ring fracture ([place])   . Carotid artery stenosis, asymptomatic 08/12/2014  . Ejection murmur 07/03/2014  . Aftercare following surgery of the circulatory system, [place] 02/11/2014  . Weakness-Abdomin and Bilateral Thigh / Leg 02/11/2014  . Carotid stenosis 07/19/2013  . Occlusion and stenosis of carotid artery without mention of cerebral infarction 07/16/2013  . Impaired fasting glucose 07/14/2013  . Knee pain 05/23/2011  . Stiffness of joint, not elsewhere classified, lower leg 05/23/2011  . Muscle weakness (generalized) 05/23/2011  . Difficulty in walking(719.7) 05/23/2011  . Bruit 11/19/2010  . AAA (abdominal aortic aneurysm) ([place]) 11/19/2010  . OTHER DYSPHAGIA 04/27/2010  . ATRIAL FIBRILLATION 12/10/2008  . JOINT EFFUSION, KNEE 01/14/2008    . KNEE PAIN 01/14/2008  . KNEE, ARTHRITIS, DEGEN./OSTEO 12/11/2007  . Hypothyroidism 10/10/2007  . Pure hypercholesterolemia 10/10/2007  . Essential hypertension 10/10/2007  . Coronary atherosclerosis 10/10/2007  . Peripheral vascular disease ([place]) 10/10/2007  . CHRONIC OBSTRUCTIVE PULMONARY DISEASE 10/10/2007  . GERD 10/10/2007  . DIVERTICULOSIS OF COLON 10/10/2007  . OSTEOPOROSIS 10/10/2007  . NEPHROLITHIASIS, HX OF 10/10/2007  . Personal history of surgery to heart and great vessels, presenting hazards to health 10/10/2007  . CHOLECYSTECTOMY, HX OF 10/10/2007  . CORONARY ARTERY BYPASS GRAFT, HX OF 10/10/2007    Past Surgical History:  Procedure Laterality Date  . ABDOMINAL AORTIC ANEURYSM REPAIR     with reimplantation of renals   . CARDIAC CATHETERIZATION     2008  . CAROTID ENDARTERECTOMY Left Jan. 30, 2015   CEA  . CHOLECYSTECTOMY  2000   Gall Bladder  . COLONOSCOPY W/ BIOPSIES AND POLYPECTOMY     Hx: of  . CORONARY ARTERY BYPASS GRAFT     1995  . CORONARY STENT PLACEMENT     Hx: of  . ENDARTERECTOMY Left 07/19/2013   Procedure: ENDARTERECTOMY CAROTID;  Surgeon: Mal Misty, MD;  Location: [facility];  Service: Vascular;  Laterality: Left;  . GALLBLADDER SURGERY  2004   . JOINT REPLACEMENT     partial knee replacement on left 2002   . KNEE SURGERY    . ORIF PELVIC FRACTURE Left 08/25/2016   Procedure: OPEN REDUCTION INTERNAL FIXATION (ORIF) PELVIC FRACTURE; plate on front, SI screw on the back;  Surgeon: Alt[place]e [place], MD;  Location: [facility];  Service: Orthopedics;  Laterality: Left;  . OTHER SURGICAL HISTORY     left subclavian stenosis surgery PTA 08/2006   . OTHER SURGICAL HISTORY     carotid surgery on right 2004   . TOTAL KNEE ARTHROPLASTY  04/29/2011   Procedure: TOTAL KNEE ARTHROPLASTY;  Surgeon: Gearlean Alf;  Location: [facility];  Service: Orthopedics;  Laterality: Right;  . urinary retention    . VASCULAR SURGERY     AAA  . VASECTOMY  1973  . VIDEO  BRONCHOSCOPY WITH ENDOBRONCHIAL ULTRASOUND [name] 09/09/2016   Procedure: VIDEO BRONCHOSCOPY WITH ENDOBRONCHIAL ULTRASOUND;  Surgeon: Grace Isaac, MD;  Location: [facility];  Service: Thoracic;  Laterality: [name];       Home Medications    Prior to Admission medications   Medication Sig Start Date End Date Taking? Authorizing Provider  acetaminophen (TYLENOL) 325 MG tablet Take 325-650 mg by mouth every 6 (six) hours as needed for headache (for pain).     [provider]  ALPRAZolam Duanne Moron) 0.25 MG tablet Take 1 tablet (0.25 mg total) by mouth 3 (three) times daily as needed for anxiety. 09/16/16   Angiulli, Lavon Paganini, PA-C  bumetanide (BUMEX) 1 MG tablet  Take 1 tablet (1 mg total) by mouth daily as needed (swelling). 10/07/16   Kathie Dike, MD  gabapentin (NEURONTIN) 100 MG capsule Take 1 capsule (100 mg total) by mouth at bedtime. 09/16/16   Angiulli, Lavon Paganini, PA-C  lansoprazole (PREVACID) 15 MG capsule Take 1 capsule (15 mg total) by mouth daily. 09/16/16   Angiulli, Lavon Paganini, PA-C  levothyroxine (SYNTHROID, LEVOTHROID) 112 MCG tablet Take 1 tablet (112 mcg total) by mouth daily. 09/16/16   Angiulli, Lavon Paganini, PA-C  linaclotide [facility]) 145 MCG CAPS capsule Take 1 capsule (145 mcg total) by mouth daily before breakfast. 09/17/16   Angiulli, Lavon Paganini, PA-C  metoprolol (LOPRESSOR) 25 MG tablet Take 0.5 tablets (12.5 mg total) by mouth 2 (two) times daily. 09/16/16   Angiulli, Lavon Paganini, PA-C  niacin (SLO-NIACIN) 500 MG tablet Take 3 tablets (1,500 mg total) by mouth at bedtime. 09/16/16   Angiulli, Lavon Paganini, PA-C  nitroGLYCERIN (NITROSTAT) 0.4 MG SL tablet Place 1 tablet (0.4 mg total) under the tongue every 5 (five) minutes as needed for chest pain. 09/16/16   Angiulli, Lavon Paganini, PA-C  ondansetron (ZOFRAN) 8 MG tablet Take 1 tablet (8 mg total) by mouth every 8 (eight) hours as needed for nausea or vomiting. 09/29/16   Curt Bears, MD  potassium chloride SA (K-DUR,KLOR-CON) 20 MEQ tablet  Take 1 tablet (20 mEq total) by mouth daily. 10/20/16   [name], MD  prochlorperazine (COMPAZINE) 10 MG tablet Take 1 tablet (10 mg total) by mouth every 6 (six) hours as needed for nausea or vomiting. 10/17/16   Curt Bears, MD  rosuvastatin (CRESTOR) 20 MG tablet Take 1 tablet (20 mg total) by mouth daily. 09/16/16   Angiulli, Lavon Paganini, PA-C  tamsulosin (FLOMAX) 0.4 MG CAPS capsule Take 1 capsule (0.4 mg total) by mouth daily after breakfast. 09/29/16   Jamse Arn, MD  warfarin (COUMADIN) 5 MG tablet 1-1/2 tablet every day except 2 tablets Tuesday Thursday Saturday Patient not taking: Reported on 10/20/2016 09/16/16   Angiulli, Lavon Paganini, PA-C    Family History Family History  Problem Relation Age of Onset  . Hypertension Mother   . Heart disease Father   . Heart attack Father   . Diabetes Sister   . Cancer Brother 82       Lung  . Diabetes Sister     Social History Social History  Substance Use Topics  . Smoking status: Former Smoker    Types: Cigarettes    Quit date: 06/20/1992  . Smokeless tobacco: Never Used  . Alcohol use No     Allergies   Imitrex [sumatriptan] and Lipitor [atorvastatin]   Review of Systems Review of Systems  Genitourinary: Positive for decreased urine volume.  All other systems reviewed and are negative.    Physical Exam Updated Vital Signs BP 135/73 (BP Location: Right Arm)   Pulse 86   Temp 97.8 F (36.6 C) (Oral)   Resp (!) 22   Ht '5\' 7"'$  (1.702 m)   Wt 142 lb (64.4 kg)   SpO2 99%   BMI 22.24 kg/m   Physical Exam  Constitutional: He is oriented to person, place, and time. He appears well-developed and well-nourished. No distress.  HENT:  Head: Normocephalic and atraumatic.  Right Ear: Hearing normal.  Left Ear: Hearing normal.  Nose: Nose normal.  Mouth/Throat: Oropharynx is clear and moist and mucous membranes are normal.  Eyes: Conjunctivae and EOM are normal. Pupils are equal, round, and reactive to  light.    Neck: Normal range of motion. Neck supple.  Cardiovascular: Regular rhythm, S1 normal and S2 normal.  Exam reveals no gallop and no friction rub.   No murmur heard. Pulmonary/Chest: Effort normal and breath sounds normal. No respiratory distress. He exhibits no tenderness.  Abdominal: Soft. Normal appearance and bowel sounds are normal. There is no hepatosplenomegaly. There is tenderness in the suprapubic area. There is no rebound, no guarding, no tenderness at McBurney's point and negative Murphy's sign. No hernia.  Musculoskeletal: Normal range of motion.  Neurological: He is alert and oriented to person, place, and time. He has normal strength. No cranial nerve deficit or sensory deficit. Coordination normal. GCS eye subscore is 4. GCS verbal subscore is 5. GCS motor subscore is 6.  Skin: Skin is warm, dry and intact. No rash noted. No cyanosis.  Psychiatric: He has a normal mood and affect. His speech is normal and behavior is normal. Thought content normal.  Nursing note and vitals reviewed.    ED Treatments / Results  Labs (all labs ordered are listed, but only abnormal results are displayed) Labs Reviewed  URINE CULTURE  URINALYSIS, ROUTINE W REFLEX MICROSCOPIC    EKG  EKG Interpretation None       Radiology No results found.  Procedures Procedures (including critical care time)  Medications Ordered in ED Medications - No data to display   Initial Impression / Assessment and Plan / ED Course  I have reviewed the triage vital signs and the nursing notes.  Pertinent labs & imaging results that were available during my care of the patient were reviewed by me and considered in my medical decision making (see chart for details).     Large amount of urine was confirmed and the patient's bladder by ultrasound. The catheter was placed with a large volume of urine output, patient felt much improvement. He has follow-up scheduled with urology in the morning.  Final  Clinical Impressions(s) / ED Diagnoses   Final diagnoses:  Acute urinary retention    New Prescriptions New Prescriptions   No medications on file     Orpah Greek, MD 10/28/16 [phone]

## 2016-10-28 NOTE — ED Notes (Signed)
Pt alert & oriented x4. Patient given discharge instructions, paperwork & prescription(s). Patient verbalized understanding. Pt left department w/ no further questions.  

## 2016-10-28 NOTE — ED Triage Notes (Signed)
Pt recently had pelvic surgery and had a foley catheter removed Thursday morning, states he has been having difficulty passing urine since last night.

## 2016-10-30 LAB — URINE CULTURE: Culture: >=100000 — AB

## 2016-10-31 ENCOUNTER — Ambulatory Visit
Admission: RE | Admit: 2016-10-31 | Discharge: 2016-10-31 | Disposition: A | Discharge status: Home / Self Care | Payer: Medicare Other | Source: Ambulatory Visit | Attending: Radiation Oncology | Admitting: Radiation Oncology

## 2016-10-31 ENCOUNTER — Ambulatory Visit (HOSPITAL_BASED_OUTPATIENT_CLINIC_OR_DEPARTMENT_OTHER): Payer: Medicare Other | Admitting: Internal Medicine

## 2016-10-31 ENCOUNTER — Encounter: Payer: Self-pay | Admitting: Internal Medicine

## 2016-10-31 ENCOUNTER — Telehealth: Payer: Self-pay | Admitting: Emergency Medicine

## 2016-10-31 ENCOUNTER — Ambulatory Visit (HOSPITAL_BASED_OUTPATIENT_CLINIC_OR_DEPARTMENT_OTHER): Payer: Medicare Other

## 2016-10-31 ENCOUNTER — Other Ambulatory Visit (HOSPITAL_BASED_OUTPATIENT_CLINIC_OR_DEPARTMENT_OTHER): Payer: Medicare Other

## 2016-10-31 ENCOUNTER — Telehealth: Payer: Self-pay | Admitting: Internal Medicine

## 2016-10-31 ENCOUNTER — Ambulatory Visit: Payer: Medicare Other | Admitting: Nutrition

## 2016-10-31 VITALS — BP 116/54 | HR 60 | Temp 97.6°F | Resp 18 | Ht 67.0 in | Wt 143.0 lb

## 2016-10-31 DIAGNOSIS — Z510 Encounter for antineoplastic radiation therapy: Secondary | ICD-10-CM | POA: Diagnosis not present

## 2016-10-31 DIAGNOSIS — Z5111 Encounter for antineoplastic chemotherapy: Secondary | ICD-10-CM | POA: Diagnosis present

## 2016-10-31 DIAGNOSIS — C3411 Malignant neoplasm of upper lobe, right bronchus or lung: Secondary | ICD-10-CM

## 2016-10-31 DIAGNOSIS — C3491 Malignant neoplasm of unspecified part of right bronchus or lung: Secondary | ICD-10-CM

## 2016-10-31 DIAGNOSIS — C342 Malignant neoplasm of middle lobe, bronchus or lung: Secondary | ICD-10-CM

## 2016-10-31 DIAGNOSIS — N189 Chronic kidney disease, unspecified: Secondary | ICD-10-CM | POA: Diagnosis not present

## 2016-10-31 DIAGNOSIS — I129 Hypertensive chronic kidney disease with stage 1 through stage 4 chronic kidney disease, or unspecified chronic kidney disease: Secondary | ICD-10-CM | POA: Diagnosis not present

## 2016-10-31 DIAGNOSIS — E039 Hypothyroidism, unspecified: Secondary | ICD-10-CM | POA: Diagnosis not present

## 2016-10-31 DIAGNOSIS — I2510 Atherosclerotic heart disease of native coronary artery without angina pectoris: Secondary | ICD-10-CM | POA: Diagnosis not present

## 2016-10-31 LAB — COMPREHENSIVE METABOLIC PANEL
ALBUMIN: 3.2 g/dL — AB (ref 3.5–5.0)
ALK PHOS: 94 U/L (ref 40–150)
ALT: 23 U/L (ref 0–55)
AST: 26 U/L (ref 5–34)
Anion Gap: 10 mEq/L (ref 3–11)
BUN: 13.7 mg/dL (ref 7.0–26.0)
CO2: 25 mEq/L (ref 22–29)
Calcium: 9.3 mg/dL (ref 8.4–10.4)
Chloride: 98 mEq/L (ref 98–109)
Creatinine: 0.7 mg/dL (ref 0.7–1.3)
EGFR: 89 mL/min/{1.73_m2} — ABNORMAL LOW (ref >90)
GLUCOSE: 99 mg/dL (ref 70–140)
POTASSIUM: 3.8 meq/L (ref 3.5–5.1)
SODIUM: 133 meq/L — AB (ref 136–145)
Total Bilirubin: 0.5 mg/dL (ref 0.20–1.20)
Total Protein: 6.6 g/dL (ref 6.4–8.3)

## 2016-10-31 LAB — CBC WITH DIFFERENTIAL/PLATELET
BASO%: 0.9 % (ref 0.0–2.0)
BASOS ABS: 0 10*3/uL (ref 0.0–0.1)
EOS ABS: 0 10*3/uL (ref 0.0–0.5)
EOS%: 0.6 % (ref 0.0–7.0)
HEMATOCRIT: 33.2 % — AB (ref 38.4–49.9)
HEMOGLOBIN: 11.4 g/dL — AB (ref 13.0–17.1)
LYMPH#: 1.4 10*3/uL (ref 0.9–3.3)
LYMPH%: 42.1 % (ref 14.0–49.0)
MCH: 29.2 pg (ref 27.2–33.4)
MCHC: 34.3 g/dL (ref 32.0–36.0)
MCV: 84.9 fL (ref 79.3–98.0)
MONO#: 0.4 10*3/uL (ref 0.1–0.9)
MONO%: 10.9 % (ref 0.0–14.0)
NEUT#: 1.5 10*3/uL (ref 1.5–6.5)
NEUT%: 45.5 % (ref 39.0–75.0)
PLATELETS: 134 10*3/uL — AB (ref 140–400)
RBC: 3.91 10*6/uL — ABNORMAL LOW (ref 4.20–5.82)
RDW: 16.1 % — ABNORMAL HIGH (ref 11.0–14.6)
WBC: 3.3 10*3/uL — ABNORMAL LOW (ref 4.0–10.3)
nRBC: 0 % (ref 0–0)

## 2016-10-31 MED ORDER — SODIUM CHLORIDE 0.9 % IV SOLN
160.2000 mg | Freq: Once | INTRAVENOUS | Status: AC
  Administered 2016-10-31: 160 mg via INTRAVENOUS
  Filled 2016-10-31: qty 16

## 2016-10-31 MED ORDER — DIPHENHYDRAMINE HCL 50 MG/ML IJ SOLN: 50.0000 mg | Freq: Once | INTRAMUSCULAR | Status: DC

## 2016-10-31 MED ORDER — SODIUM CHLORIDE 0.9 % IV SOLN
50.0000 mg | Freq: Once | INTRAVENOUS | Status: AC
  Administered 2016-10-31: 50 mg via INTRAVENOUS
  Filled 2016-10-31: qty 1

## 2016-10-31 MED ORDER — SODIUM CHLORIDE 0.9 % IV SOLN
20.0000 mg | Freq: Once | INTRAVENOUS | Status: AC
  Administered 2016-10-31: 20 mg via INTRAVENOUS
  Filled 2016-10-31: qty 2

## 2016-10-31 MED ORDER — PALONOSETRON HCL INJECTION 0.25 MG/5ML
INTRAVENOUS | Status: AC
  Filled 2016-10-31: qty 5

## 2016-10-31 MED ORDER — PALONOSETRON HCL INJECTION 0.25 MG/5ML
0.2500 mg | Freq: Once | INTRAVENOUS | Status: AC
  Administered 2016-10-31: 0.25 mg via INTRAVENOUS

## 2016-10-31 MED ORDER — FAMOTIDINE IN NACL 20-0.9 MG/50ML-% IV SOLN
INTRAVENOUS | Status: AC
  Filled 2016-10-31: qty 50

## 2016-10-31 MED ORDER — SODIUM CHLORIDE 0.9 % IV SOLN
Freq: Once | INTRAVENOUS | Status: AC
  Administered 2016-10-31: 12:00:00 via INTRAVENOUS

## 2016-10-31 MED ORDER — FAMOTIDINE IN NACL 20-0.9 MG/50ML-% IV SOLN
20.0000 mg | Freq: Once | INTRAVENOUS | Status: AC
  Administered 2016-10-31: 20 mg via INTRAVENOUS

## 2016-10-31 MED ORDER — DEXTROSE 5 % IV SOLN
45.0000 mg/m2 | Freq: Once | INTRAVENOUS | Status: AC
  Administered 2016-10-31: 78 mg via INTRAVENOUS
  Filled 2016-10-31: qty 13

## 2016-10-31 NOTE — Telephone Encounter (Signed)
Gave patient calender - no additional appts added- NO LOS per 5/14

## 2016-10-31 NOTE — Patient Instructions (Signed)
[facility] Discharge Instructions for Patients Receiving Chemotherapy  Today you received the following chemotherapy agents paclitaxel (Taxol) and carboplatin (Paraplatin).   To help prevent nausea and vomiting after your treatment, we encourage you to take your nausea medication as needed   If you develop nausea and vomiting that is not controlled by your nausea medication, call the clinic.   BELOW ARE SYMPTOMS THAT SHOULD BE REPORTED IMMEDIATELY:  *FEVER GREATER THAN 100.5 F  *CHILLS WITH OR WITHOUT FEVER  NAUSEA AND VOMITING THAT IS NOT CONTROLLED WITH YOUR NAUSEA MEDICATION  *UNUSUAL SHORTNESS OF BREATH  *UNUSUAL BRUISING OR BLEEDING  TENDERNESS IN MOUTH AND THROAT WITH OR WITHOUT PRESENCE OF ULCERS  *URINARY PROBLEMS  *BOWEL PROBLEMS  UNUSUAL RASH Items with * indicate a potential emergency and should be followed up as soon as possible.  Feel free to call the clinic you have any questions or concerns. The clinic phone number is [phone].  Please show the CHEMO ALERT CARD at check-in to the Emergency Department and triage nurse.   

## 2016-10-31 NOTE — Progress Notes (Signed)
Nutrition follow-up completed with patient receiving chemotherapy for lung cancer. Weight is stable and documented as 143 pounds May 14 from 140.4 pounds April 16. Patient denies nutrition impact symptoms other than constipation. He reports he is doing well.  He is eating well.  Nutrition diagnosis: Unintended weight loss improved.  Intervention: Educated patient to continue high-calorie high-protein foods in small frequent meals and snacks. Recommended patient continue oral nutrition supplements twice daily. Teach back method used.  Monitoring, evaluation, goals: Patient will tolerate increased calories and protein to minimize weight loss.  Next visit: Monday, June 4, during infusion.  **Disclaimer: This note was dictated with voice recognition software. Similar sounding words can inadvertently be transcribed and this note may contain transcription errors which may not have been corrected upon publication of note.**

## 2016-10-31 NOTE — Progress Notes (Signed)
[facility] [phone]   [phone]  OFFICE PROGRESS NOTE  [name] Kirschner, MD [address] [place] [address]  DIAGNOSIS: Stage IIIA (T1b, N2, M0) non-small cell lung cancer, adenocarcinoma with positive PDL 1 expression 50% diagnosed in March 2018 and presented with right upper lobe lung nodule in addition to mediastinal lymphadenopathy.  PRIOR THERAPY: None.  CURRENT THERAPY: Course of concurrent chemoradiation with weekly carboplatin for AUC of 2 and paclitaxel 45 MG/M2. First dose of chemotherapy 10/17/2016. Status post 2 cycles.  INTERVAL HISTORY: NIKODEM LEADBETTER 76 y.o. male returns to the clinic today for follow-up visit accompanied by his wife. The patient is feeling fine today with no specific complaints. He is tolerating his current treatment with concurrent chemoradiation fairly well. He denied having any fever or chills. He has no nausea or vomiting. He has no significant weight loss or night sweats. He denied having any chest pain, shortness breath, cough or hemoptysis. He continues to have urinary retention and he has the [facility] catheter inserted again. He is here today for evaluation before starting cycle #3.  MEDICAL HISTORY: Past Medical History:  Diagnosis Date  . Adenocarcinoma of right lung, stage 3 ([place]) 09/20/2016  . Atrial fibrillation ([place] [facility])    no hx of reported at preop visit of 04/21/11   . CAD (coronary artery disease)   . Carotid artery occlusion    left carotid endarterectomy  . Chronic kidney disease    AAA repair with reimplant of renals   . CHRONIC OBSTRUCTIVE PULMONARY DISEASE    pt denied at visit of 04/21/11   . CORONARY ARTERY DISEASE   . Disorder of left sacroiliac joint 08/24/2016  . DIVERTICULOSIS OF COLON   . Encounter for antineoplastic chemotherapy 10/17/2016  . GERD   . H/O hiatal hernia   . Headache(784.0)    Hx: of years of years  . HYPERTENSION   . HYPOTHYROIDISM   . JOINT  EFFUSION, KNEE   . KNEE, ARTHRITIS, DEGEN./OSTEO    right knee   . Myocardial infarction ([place])    1994   . NEPHROLITHIASIS, HX OF   . OSTEOPOROSIS   . Other dysphagia   . PERIPHERAL VASCULAR DISEASE    AAA - 1994 with reimplant of renals   . Peripheral vascular disease ([place])    subclavian stenosis PTA - 3/08   . Pure hypercholesterolemia   . Renal artery stenosis ([facility])     ALLERGIES:  is allergic to neurontin [gabapentin]; imitrex [sumatriptan]; and lipitor [atorvastatin].  MEDICATIONS:  Current Outpatient Prescriptions  Medication Sig Dispense Refill  . acetaminophen (TYLENOL) 325 MG tablet Take 325-650 mg by mouth every 6 (six) hours as needed for headache (for pain).     [name] ALPRAZolam (XANAX) 0.25 MG tablet Take 1 tablet (0.25 mg total) by mouth 3 (three) times daily as needed for anxiety. 30 tablet 0  . bumetanide (BUMEX) 1 MG tablet Take 1 tablet (1 mg total) by mouth daily as needed (swelling). 30 tablet 1  . lansoprazole (PREVACID) 15 MG capsule Take 1 capsule (15 mg total) by mouth daily. 30 capsule 5  . levothyroxine (SYNTHROID, LEVOTHROID) 112 MCG tablet Take 1 tablet (112 mcg total) by mouth daily. 30 tablet 5  . linaclotide (LINZESS) 145 MCG CAPS capsule Take 1 capsule (145 mcg total) by mouth daily before breakfast. 30 capsule 1  . metoprolol (LOPRESSOR) 25 MG tablet Take 0.5 tablets (12.5 mg total) by mouth 2 (  two) times daily. 60 tablet 0  . niacin (SLO-NIACIN) 500 MG tablet Take 3 tablets (1,500 mg total) by mouth at bedtime. 30 tablet 0  . potassium chloride SA (K-DUR,KLOR-CON) 20 MEQ tablet Take 1 tablet (20 mEq total) by mouth daily. 30 tablet 11  . rosuvastatin (CRESTOR) 20 MG tablet Take 1 tablet (20 mg total) by mouth daily. 30 tablet 5  . tamsulosin (FLOMAX) 0.4 MG CAPS capsule Take 1 capsule (0.4 mg total) by mouth daily after breakfast. 30 capsule 1  . warfarin (COUMADIN) 5 MG tablet 1-1/2 tablet every day except 2 tablets Tuesday Thursday Saturday 90 tablet 1    . nitroGLYCERIN (NITROSTAT) 0.4 MG SL tablet Place 1 tablet (0.4 mg total) under the tongue every 5 (five) minutes as needed for chest pain. (Patient not taking: Reported on 10/31/2016) 25 tablet 10  . ondansetron (ZOFRAN) 8 MG tablet Take 1 tablet (8 mg total) by mouth every 8 (eight) hours as needed for nausea or vomiting. (Patient not taking: Reported on 10/31/2016) 20 tablet 0  . prochlorperazine (COMPAZINE) 10 MG tablet Take 1 tablet (10 mg total) by mouth every 6 (six) hours as needed for nausea or vomiting. (Patient not taking: Reported on 10/31/2016) 30 tablet 0   No current facility-administered medications for this visit.     SURGICAL HISTORY:  Past Surgical History:  Procedure Laterality Date  . ABDOMINAL AORTIC ANEURYSM REPAIR     with reimplantation of renals   . CARDIAC CATHETERIZATION     2008  . CAROTID ENDARTERECTOMY Left Jan. 30, 2015   CEA  . CHOLECYSTECTOMY  2000   Gall Bladder  . COLONOSCOPY W/ BIOPSIES AND POLYPECTOMY     Hx: of  . CORONARY ARTERY BYPASS GRAFT     1995  . CORONARY STENT PLACEMENT     Hx: of  . ENDARTERECTOMY Left 07/19/2013   Procedure: ENDARTERECTOMY CAROTID;  Surgeon: Mal Misty, MD;  Location: [facility];  Service: Vascular;  Laterality: Left;  . GALLBLADDER SURGERY  2004   . JOINT REPLACEMENT     partial knee replacement on left 2002   . KNEE SURGERY    . ORIF PELVIC FRACTURE Left 08/25/2016   Procedure: OPEN REDUCTION INTERNAL FIXATION (ORIF) PELVIC FRACTURE; plate on front, SI screw on the back;  Surgeon: Altamese Inwood, MD;  Location: [facility];  Service: Orthopedics;  Laterality: Left;  . OTHER SURGICAL HISTORY     left subclavian stenosis surgery PTA 08/2006   . OTHER SURGICAL HISTORY     carotid surgery on right 2004   . TOTAL KNEE ARTHROPLASTY  04/29/2011   Procedure: TOTAL KNEE ARTHROPLASTY;  Surgeon: Gearlean Alf;  Location: [facility];  Service: Orthopedics;  Laterality: Right;  . urinary retention    . VASCULAR SURGERY     AAA  .  VASECTOMY  1973  . VIDEO BRONCHOSCOPY WITH ENDOBRONCHIAL ULTRASOUND [name] 09/09/2016   Procedure: VIDEO BRONCHOSCOPY WITH ENDOBRONCHIAL ULTRASOUND;  Surgeon: Grace Isaac, MD;  Location: [facility];  Service: Thoracic;  Laterality: [name];    REVIEW OF SYSTEMS:  A comprehensive review of systems was negative except for: Constitutional: positive for fatigue Genitourinary: positive for decreased stream Musculoskeletal: positive for muscle weakness   PHYSICAL EXAMINATION: General appearance: alert, cooperative, fatigued and no distress Head: Normocephalic, without obvious abnormality, atraumatic Neck: no adenopathy, no JVD, supple, symmetrical, trachea midline and thyroid not enlarged, symmetric, no tenderness/mass/nodules Lymph nodes: Cervical, supraclavicular, and axillary nodes normal. Resp: clear to auscultation bilaterally Back:  symmetric, no curvature. ROM normal. No CVA tenderness. Cardio: regular rate and rhythm, S1, S2 normal, no murmur, click, rub or gallop GI: soft, non-tender; bowel sounds normal; no masses,  no organomegaly Extremities: extremities normal, atraumatic, no cyanosis or edema  ECOG PERFORMANCE STATUS: 1 - Symptomatic but completely ambulatory  Blood pressure (!) 116/54, pulse 60, temperature 97.6 F (36.4 C), temperature source Oral, resp. rate 18, height '5\' 7"'$  (1.702 m), weight 143 lb (64.9 kg), SpO2 99 %.  LABORATORY DATA: Lab Results  Component Value Date   WBC 3.3 (L) 10/31/2016   HGB 11.4 (L) 10/31/2016   HCT 33.2 (L) 10/31/2016   MCV 84.9 10/31/2016   PLT 134 (L) 10/31/2016      Chemistry      Component Value Date/Time   NA 133 (L) 10/31/2016 1025   K 3.8 10/31/2016 1025   CL 100 (L) 10/06/2016 0611   CO2 25 10/31/2016 1025   BUN 13.7 10/31/2016 1025   CREATININE 0.7 10/31/2016 1025      Component Value Date/Time   CALCIUM 9.3 10/31/2016 1025   ALKPHOS 94 10/31/2016 1025   AST 26 10/31/2016 1025   ALT 23 10/31/2016 1025   BILITOT 0.50 10/31/2016  1025       RADIOGRAPHIC STUDIES: Dg Chest 2 View  Result Date: 10/03/2016 CLINICAL DATA:  Fever, hypertension, former smoker, atherosclerosis, known right middle lobe 2 cm PET positive nodule. EXAM: CHEST  2 VIEW COMPARISON:  09/08/2016, 09/29/2016 FINDINGS: Previous coronary bypass changes and aortic arch vascular stent. Stable mild cardiac enlargement with normal vascularity. Anterior right middle lobe 2 cm spiculated nodule overlies the right infrahilar vasculature and is obscured by plain radiography. Right hilar and subcarinal adenopathy by PET-CT not confidently demonstrated by plain radiography. No superimposed significant pneumonia, collapse, consolidation, or edema. No effusion or pneumothorax. Trachea is midline. Atherosclerosis noted of the aorta. Degenerative changes of the spine. IMPRESSION: No superimposed acute chest process. Cardiomegaly without CHF Thoracic aortic atherosclerosis Anterior 2 cm right middle lobe nodule, right hilar and subcarinal adenopathy demonstrated by PET-CT not well appreciated by plain radiography. Please refer to the PET-CT of 4 days ago. Electronically Signed   By: Jerilynn Mages.  Shick M.D.   On: 10/03/2016 15:59   Mr Jeri Cos ZO Contrast  Result Date: 10/15/2016 CLINICAL DATA:  History of lung cancer, metastatic disease to the brain, continued surveillance. Atrial fibrillation. EXAM: MRI HEAD WITHOUT AND WITH CONTRAST TECHNIQUE: Multiplanar, multiecho pulse sequences of the brain and surrounding structures were obtained without and with intravenous contrast. CONTRAST:  26m MULTIHANCE GADOBENATE DIMEGLUMINE 529 MG/ML IV SOLN COMPARISON:  09/02/2016 MR. FINDINGS: Brain: Restricted diffusion in the RIGHT hemisphere is redemonstrated with some new areas of acute ischemia not seen on the previous scan, consistent with ongoing hypoperfusion and multifocal watershed infarcts. Probable subtle gyriform enhancement on the LEFT, with continued involution of the hemorrhagic watershed  lesion in the LEFT frontal lobe, precontrast gyriform T1 shortening, marked susceptibility on gradient imaging, but no concerning features of nodular postcontrast enhancement to suggest metastatic disease. New enhancement in the RIGHT frontal cortex, see image 131 series 14, also slightly more posteriorly near the motor strip, image 137 series 14, correspond to areas of restricted diffusion on the prior study, and represent luxury perfusion of subacute infarction, rather than metastatic disease. Tiny residual LEFT subdural, minimally improved. Vascular: Flow voids are maintained in the carotid, basilar, and vertebral arteries. Skull and upper cervical spine: Normal marrow signal. Sinuses/Orbits:  No acute findings.  Mild  chronic sinus disease. Other: None. IMPRESSION: No definite intracranial metastatic disease is observed. Continued evolution of the hemorrhagic LEFT frontal infarct, without concerning features for LEFT frontal metastasis. Atrophy and small vessel disease. Acute on subacute watershed infarcts continue to develop in the RIGHT hemisphere. There is concern for proximal high-grade stenosis. An embolic source is less favored. Consider CTA head neck for further evaluation. Electronically Signed   By: Staci Righter M.D.   On: 10/15/2016 12:03   US Renal  Result Date: 10/05/2016 CLINICAL DATA:  UTI. EXAM: RENAL / URINARY TRACT ULTRASOUND COMPLETE COMPARISON:  PET-CT 09/29/2016. FINDINGS: Right Kidney: Length: 12.5 cm. Echogenicity within normal limits. No mass or hydronephrosis visualized. Left Kidney: Length: 12.3 cm. Echogenicity within normal limits. No mass or hydronephrosis visualized. Bladder: Foley catheter in the bladder.  Bladder nondistended. IMPRESSION: No acute or focal abnormality identified. No evidence of hydronephrosis. Foley catheter noted in the bladder. Bladder nondistended. Electronically Signed   By: Marcello Moores  Register   On: 10/05/2016 14:26    ASSESSMENT AND PLAN:  This is a very  pleasant 76 years old white male with a stage IIIa non-small cell lung cancer, adenocarcinoma with positive PDL 1 expression of 50%. The patient is currently on a course of concurrent chemoradiation with weekly carboplatin and paclitaxel is status post 2 cycles. He has been tolerating his treatment well with no significant adverse effects. I recommended for the patient to proceed with cycle #3 today as a scheduled. I will see him back for follow-up visit in 2 weeks for evaluation before starting cycle #5. He was advised to call immediately if he has any concerning symptoms in the interval. The patient voices understanding of current disease status and treatment options and is in agreement with the current care plan. All questions were answered. The patient knows to call the clinic with any problems, questions or concerns. We can certainly see the patient much sooner if necessary. I spent 10 minutes counseling the patient face to face. The total time spent in the appointment was 15 minutes.  Disclaimer: This note was dictated with voice recognition software. Similar sounding words can inadvertently be transcribed and may not be corrected upon review.

## 2016-10-31 NOTE — Telephone Encounter (Signed)
Post ED Visit - Positive Culture Follow-up  Culture report reviewed by antimicrobial stewardship pharmacist:  '[]'$  Elenor Quinones, Pharm.D. '[x]'$  Heide Guile, Pharm.D., BCPS AQ-ID '[]'$  Parks Neptune, Pharm.D., BCPS '[]'$  Alycia Rossetti, Pharm.D., BCPS '[]'$  [place], Pharm.D., BCPS, AAHIVP '[]'$  Legrand Como, Pharm.D., BCPS, AAHIVP '[]'$  Salome Arnt, PharmD, BCPS '[]'$  Dimitri Ped, PharmD, BCPS '[]'$  Vincenza Hews, PharmD, BCPS  Positive urine culture Treated with none, asymptomatic, no further patient follow-up is required at this time.  Hazle Nordmann 10/31/2016, 10:52 AM

## 2016-11-01 ENCOUNTER — Ambulatory Visit
Admission: RE | Admit: 2016-11-01 | Discharge: 2016-11-01 | Disposition: A | Discharge status: Home / Self Care | Payer: Medicare Other | Source: Ambulatory Visit | Attending: Radiation Oncology | Admitting: Radiation Oncology

## 2016-11-01 DIAGNOSIS — I2510 Atherosclerotic heart disease of native coronary artery without angina pectoris: Secondary | ICD-10-CM | POA: Diagnosis not present

## 2016-11-01 DIAGNOSIS — I129 Hypertensive chronic kidney disease with stage 1 through stage 4 chronic kidney disease, or unspecified chronic kidney disease: Secondary | ICD-10-CM | POA: Diagnosis not present

## 2016-11-01 DIAGNOSIS — C342 Malignant neoplasm of middle lobe, bronchus or lung: Secondary | ICD-10-CM | POA: Diagnosis not present

## 2016-11-01 DIAGNOSIS — E039 Hypothyroidism, unspecified: Secondary | ICD-10-CM | POA: Diagnosis not present

## 2016-11-01 DIAGNOSIS — N189 Chronic kidney disease, unspecified: Secondary | ICD-10-CM | POA: Diagnosis not present

## 2016-11-01 DIAGNOSIS — Z510 Encounter for antineoplastic radiation therapy: Secondary | ICD-10-CM | POA: Diagnosis not present

## 2016-11-02 ENCOUNTER — Ambulatory Visit
Admission: RE | Admit: 2016-11-02 | Discharge: 2016-11-02 | Disposition: A | Discharge status: Home / Self Care | Payer: Medicare Other | Source: Ambulatory Visit | Attending: Radiation Oncology | Admitting: Radiation Oncology

## 2016-11-02 ENCOUNTER — Telehealth: Payer: Self-pay | Admitting: *Deleted

## 2016-11-02 DIAGNOSIS — S32811D Multiple fractures of pelvis with unstable disruption of pelvic ring, subsequent encounter for fracture with routine healing: Secondary | ICD-10-CM | POA: Diagnosis not present

## 2016-11-02 DIAGNOSIS — N189 Chronic kidney disease, unspecified: Secondary | ICD-10-CM | POA: Diagnosis not present

## 2016-11-02 DIAGNOSIS — I739 Peripheral vascular disease, unspecified: Secondary | ICD-10-CM | POA: Diagnosis not present

## 2016-11-02 DIAGNOSIS — I4891 Unspecified atrial fibrillation: Secondary | ICD-10-CM | POA: Diagnosis not present

## 2016-11-02 DIAGNOSIS — I82409 Acute embolism and thrombosis of unspecified deep veins of unspecified lower extremity: Secondary | ICD-10-CM | POA: Diagnosis not present

## 2016-11-02 DIAGNOSIS — Z510 Encounter for antineoplastic radiation therapy: Secondary | ICD-10-CM | POA: Diagnosis not present

## 2016-11-02 DIAGNOSIS — I2510 Atherosclerotic heart disease of native coronary artery without angina pectoris: Secondary | ICD-10-CM | POA: Diagnosis not present

## 2016-11-02 DIAGNOSIS — C342 Malignant neoplasm of middle lobe, bronchus or lung: Secondary | ICD-10-CM | POA: Diagnosis not present

## 2016-11-02 DIAGNOSIS — S329XXD Fracture of unspecified parts of lumbosacral spine and pelvis, subsequent encounter for fracture with routine healing: Secondary | ICD-10-CM | POA: Diagnosis not present

## 2016-11-02 DIAGNOSIS — I129 Hypertensive chronic kidney disease with stage 1 through stage 4 chronic kidney disease, or unspecified chronic kidney disease: Secondary | ICD-10-CM | POA: Diagnosis not present

## 2016-11-02 DIAGNOSIS — N390 Urinary tract infection, site not specified: Secondary | ICD-10-CM | POA: Diagnosis not present

## 2016-11-02 DIAGNOSIS — C8521 Mediastinal (thymic) large B-cell lymphoma, lymph nodes of head, face, and neck: Secondary | ICD-10-CM | POA: Diagnosis not present

## 2016-11-02 DIAGNOSIS — E039 Hypothyroidism, unspecified: Secondary | ICD-10-CM | POA: Diagnosis not present

## 2016-11-02 NOTE — Telephone Encounter (Signed)
Nurse from [facility] calling with INR 2.1 today. Takes coumadin '5mg'$  tablets one and a half every day. Consult with dr Nicki Reaper. Continue the same treatment and recheck in 3 weeks. Home health nurse verbalized understanding.

## 2016-11-03 ENCOUNTER — Ambulatory Visit
Admission: RE | Admit: 2016-11-03 | Discharge: 2016-11-03 | Disposition: A | Discharge status: Home / Self Care | Payer: Medicare Other | Source: Ambulatory Visit | Attending: Radiation Oncology | Admitting: Radiation Oncology

## 2016-11-03 DIAGNOSIS — N189 Chronic kidney disease, unspecified: Secondary | ICD-10-CM | POA: Diagnosis not present

## 2016-11-03 DIAGNOSIS — I2510 Atherosclerotic heart disease of native coronary artery without angina pectoris: Secondary | ICD-10-CM | POA: Diagnosis not present

## 2016-11-03 DIAGNOSIS — C342 Malignant neoplasm of middle lobe, bronchus or lung: Secondary | ICD-10-CM | POA: Diagnosis not present

## 2016-11-03 DIAGNOSIS — E039 Hypothyroidism, unspecified: Secondary | ICD-10-CM | POA: Diagnosis not present

## 2016-11-03 DIAGNOSIS — I129 Hypertensive chronic kidney disease with stage 1 through stage 4 chronic kidney disease, or unspecified chronic kidney disease: Secondary | ICD-10-CM | POA: Diagnosis not present

## 2016-11-03 DIAGNOSIS — Z510 Encounter for antineoplastic radiation therapy: Secondary | ICD-10-CM | POA: Diagnosis not present

## 2016-11-04 ENCOUNTER — Telehealth: Payer: Self-pay | Admitting: Family Medicine

## 2016-11-04 ENCOUNTER — Ambulatory Visit
Admission: RE | Admit: 2016-11-04 | Discharge: 2016-11-04 | Disposition: A | Discharge status: Home / Self Care | Payer: Medicare Other | Source: Ambulatory Visit | Attending: Radiation Oncology | Admitting: Radiation Oncology

## 2016-11-04 DIAGNOSIS — S329XXD Fracture of unspecified parts of lumbosacral spine and pelvis, subsequent encounter for fracture with routine healing: Secondary | ICD-10-CM | POA: Diagnosis not present

## 2016-11-04 DIAGNOSIS — I739 Peripheral vascular disease, unspecified: Secondary | ICD-10-CM | POA: Diagnosis not present

## 2016-11-04 DIAGNOSIS — E039 Hypothyroidism, unspecified: Secondary | ICD-10-CM | POA: Diagnosis not present

## 2016-11-04 DIAGNOSIS — N390 Urinary tract infection, site not specified: Secondary | ICD-10-CM | POA: Diagnosis not present

## 2016-11-04 DIAGNOSIS — I82409 Acute embolism and thrombosis of unspecified deep veins of unspecified lower extremity: Secondary | ICD-10-CM | POA: Diagnosis not present

## 2016-11-04 DIAGNOSIS — C8521 Mediastinal (thymic) large B-cell lymphoma, lymph nodes of head, face, and neck: Secondary | ICD-10-CM | POA: Diagnosis not present

## 2016-11-04 DIAGNOSIS — C342 Malignant neoplasm of middle lobe, bronchus or lung: Secondary | ICD-10-CM | POA: Diagnosis not present

## 2016-11-04 DIAGNOSIS — I4891 Unspecified atrial fibrillation: Secondary | ICD-10-CM | POA: Diagnosis not present

## 2016-11-04 DIAGNOSIS — N189 Chronic kidney disease, unspecified: Secondary | ICD-10-CM | POA: Diagnosis not present

## 2016-11-04 DIAGNOSIS — Z510 Encounter for antineoplastic radiation therapy: Secondary | ICD-10-CM | POA: Diagnosis not present

## 2016-11-04 DIAGNOSIS — I129 Hypertensive chronic kidney disease with stage 1 through stage 4 chronic kidney disease, or unspecified chronic kidney disease: Secondary | ICD-10-CM | POA: Diagnosis not present

## 2016-11-04 DIAGNOSIS — I2510 Atherosclerotic heart disease of native coronary artery without angina pectoris: Secondary | ICD-10-CM | POA: Diagnosis not present

## 2016-11-04 NOTE — Telephone Encounter (Signed)
Call from [facility] requesting verbal orders for 2 more weeks of physical therapy for pelvis fracture.

## 2016-11-04 NOTE — Telephone Encounter (Signed)
Amy from [facility] notified.

## 2016-11-04 NOTE — Telephone Encounter (Signed)
ok 

## 2016-11-07 ENCOUNTER — Ambulatory Visit
Admission: RE | Admit: 2016-11-07 | Discharge: 2016-11-07 | Disposition: A | Discharge status: Home / Self Care | Payer: Medicare Other | Source: Ambulatory Visit | Attending: Radiation Oncology | Admitting: Radiation Oncology

## 2016-11-07 ENCOUNTER — Other Ambulatory Visit (HOSPITAL_BASED_OUTPATIENT_CLINIC_OR_DEPARTMENT_OTHER): Payer: Medicare Other

## 2016-11-07 ENCOUNTER — Ambulatory Visit (HOSPITAL_BASED_OUTPATIENT_CLINIC_OR_DEPARTMENT_OTHER): Payer: Medicare Other

## 2016-11-07 VITALS — BP 128/57 | HR 57 | Temp 97.8°F | Resp 17

## 2016-11-07 DIAGNOSIS — I2510 Atherosclerotic heart disease of native coronary artery without angina pectoris: Secondary | ICD-10-CM | POA: Diagnosis not present

## 2016-11-07 DIAGNOSIS — C342 Malignant neoplasm of middle lobe, bronchus or lung: Secondary | ICD-10-CM

## 2016-11-07 DIAGNOSIS — Z5111 Encounter for antineoplastic chemotherapy: Secondary | ICD-10-CM | POA: Diagnosis present

## 2016-11-07 DIAGNOSIS — Z510 Encounter for antineoplastic radiation therapy: Secondary | ICD-10-CM | POA: Diagnosis not present

## 2016-11-07 DIAGNOSIS — C3411 Malignant neoplasm of upper lobe, right bronchus or lung: Secondary | ICD-10-CM

## 2016-11-07 DIAGNOSIS — E039 Hypothyroidism, unspecified: Secondary | ICD-10-CM | POA: Diagnosis not present

## 2016-11-07 DIAGNOSIS — C3491 Malignant neoplasm of unspecified part of right bronchus or lung: Secondary | ICD-10-CM

## 2016-11-07 DIAGNOSIS — I129 Hypertensive chronic kidney disease with stage 1 through stage 4 chronic kidney disease, or unspecified chronic kidney disease: Secondary | ICD-10-CM | POA: Diagnosis not present

## 2016-11-07 DIAGNOSIS — N189 Chronic kidney disease, unspecified: Secondary | ICD-10-CM | POA: Diagnosis not present

## 2016-11-07 LAB — COMPREHENSIVE METABOLIC PANEL
ALBUMIN: 3.2 g/dL — AB (ref 3.5–5.0)
ALK PHOS: 103 U/L (ref 40–150)
ALT: 28 U/L (ref 0–55)
AST: 29 U/L (ref 5–34)
Anion Gap: 11 mEq/L (ref 3–11)
BILIRUBIN TOTAL: 0.52 mg/dL (ref 0.20–1.20)
BUN: 9.8 mg/dL (ref 7.0–26.0)
CO2: 24 mEq/L (ref 22–29)
CREATININE: 0.8 mg/dL (ref 0.7–1.3)
Calcium: 9.3 mg/dL (ref 8.4–10.4)
Chloride: 101 mEq/L (ref 98–109)
EGFR: 87 mL/min/{1.73_m2} — ABNORMAL LOW (ref >90)
GLUCOSE: 137 mg/dL (ref 70–140)
Potassium: 4.2 mEq/L (ref 3.5–5.1)
Sodium: 136 mEq/L (ref 136–145)
TOTAL PROTEIN: 6.4 g/dL (ref 6.4–8.3)

## 2016-11-07 LAB — CBC WITH DIFFERENTIAL/PLATELET
BASO%: 0.7 % (ref 0.0–2.0)
BASOS ABS: 0 10*3/uL (ref 0.0–0.1)
EOS%: 0.7 % (ref 0.0–7.0)
Eosinophils Absolute: 0 10*3/uL (ref 0.0–0.5)
HEMATOCRIT: 34.3 % — AB (ref 38.4–49.9)
HEMOGLOBIN: 11.7 g/dL — AB (ref 13.0–17.1)
LYMPH#: 0.6 10*3/uL — AB (ref 0.9–3.3)
LYMPH%: 22.8 % (ref 14.0–49.0)
MCH: 29.1 pg (ref 27.2–33.4)
MCHC: 34.1 g/dL (ref 32.0–36.0)
MCV: 85.3 fL (ref 79.3–98.0)
MONO#: 0.3 10*3/uL (ref 0.1–0.9)
MONO%: 10.9 % (ref 0.0–14.0)
NEUT#: 1.7 10*3/uL (ref 1.5–6.5)
NEUT%: 64.9 % (ref 39.0–75.0)
Platelets: 147 10*3/uL (ref 140–400)
RBC: 4.02 10*6/uL — ABNORMAL LOW (ref 4.20–5.82)
RDW: 16.8 % — AB (ref 11.0–14.6)
WBC: 2.7 10*3/uL — ABNORMAL LOW (ref 4.0–10.3)

## 2016-11-07 MED ORDER — FAMOTIDINE IN NACL 20-0.9 MG/50ML-% IV SOLN
20.0000 mg | Freq: Once | INTRAVENOUS | Status: AC
  Administered 2016-11-07: 20 mg via INTRAVENOUS

## 2016-11-07 MED ORDER — SODIUM CHLORIDE 0.9 % IV SOLN: 50.0000 mg | Freq: Once | INTRAVENOUS | Status: DC

## 2016-11-07 MED ORDER — FAMOTIDINE IN NACL 20-0.9 MG/50ML-% IV SOLN
INTRAVENOUS | Status: AC
  Filled 2016-11-07: qty 50

## 2016-11-07 MED ORDER — DIPHENHYDRAMINE HCL 50 MG/ML IJ SOLN
50.0000 mg | Freq: Once | INTRAMUSCULAR | Status: AC
  Administered 2016-11-07: 50 mg via INTRAVENOUS

## 2016-11-07 MED ORDER — PALONOSETRON HCL INJECTION 0.25 MG/5ML
INTRAVENOUS | Status: AC
  Filled 2016-11-07: qty 5

## 2016-11-07 MED ORDER — DIPHENHYDRAMINE HCL 50 MG/ML IJ SOLN
INTRAMUSCULAR | Status: AC
  Filled 2016-11-07: qty 1

## 2016-11-07 MED ORDER — PALONOSETRON HCL INJECTION 0.25 MG/5ML
0.2500 mg | Freq: Once | INTRAVENOUS | Status: AC
  Administered 2016-11-07: 0.25 mg via INTRAVENOUS

## 2016-11-07 MED ORDER — SODIUM CHLORIDE 0.9 % IV SOLN
160.2000 mg | Freq: Once | INTRAVENOUS | Status: AC
  Administered 2016-11-07: 160 mg via INTRAVENOUS
  Filled 2016-11-07: qty 16

## 2016-11-07 MED ORDER — SODIUM CHLORIDE 0.9 % IV SOLN
Freq: Once | INTRAVENOUS | Status: AC
  Administered 2016-11-07: 14:00:00 via INTRAVENOUS

## 2016-11-07 MED ORDER — SODIUM CHLORIDE 0.9 % IV SOLN
20.0000 mg | Freq: Once | INTRAVENOUS | Status: AC
  Administered 2016-11-07: 20 mg via INTRAVENOUS
  Filled 2016-11-07: qty 2

## 2016-11-07 MED ORDER — PACLITAXEL CHEMO INJECTION 300 MG/50ML
45.0000 mg/m2 | Freq: Once | INTRAVENOUS | Status: AC
  Administered 2016-11-07: 78 mg via INTRAVENOUS
  Filled 2016-11-07: qty 13

## 2016-11-07 NOTE — Patient Instructions (Signed)
[facility] Discharge Instructions for Patients Receiving Chemotherapy  Today you received the following chemotherapy agents paclitaxel (Taxol) and carboplatin (Paraplatin).   To help prevent nausea and vomiting after your treatment, we encourage you to take your nausea medication as needed   If you develop nausea and vomiting that is not controlled by your nausea medication, call the clinic.   BELOW ARE SYMPTOMS THAT SHOULD BE REPORTED IMMEDIATELY:  *FEVER GREATER THAN 100.5 F  *CHILLS WITH OR WITHOUT FEVER  NAUSEA AND VOMITING THAT IS NOT CONTROLLED WITH YOUR NAUSEA MEDICATION  *UNUSUAL SHORTNESS OF BREATH  *UNUSUAL BRUISING OR BLEEDING  TENDERNESS IN MOUTH AND THROAT WITH OR WITHOUT PRESENCE OF ULCERS  *URINARY PROBLEMS  *BOWEL PROBLEMS  UNUSUAL RASH Items with * indicate a potential emergency and should be followed up as soon as possible.  Feel free to call the clinic you have any questions or concerns. The clinic phone number is [phone].  Please show the CHEMO ALERT CARD at check-in to the Emergency Department and triage nurse.   

## 2016-11-08 ENCOUNTER — Ambulatory Visit
Admission: RE | Admit: 2016-11-08 | Discharge: 2016-11-08 | Disposition: A | Discharge status: Home / Self Care | Payer: Medicare Other | Source: Ambulatory Visit | Attending: Radiation Oncology | Admitting: Radiation Oncology

## 2016-11-08 DIAGNOSIS — Z510 Encounter for antineoplastic radiation therapy: Secondary | ICD-10-CM | POA: Diagnosis not present

## 2016-11-08 DIAGNOSIS — N189 Chronic kidney disease, unspecified: Secondary | ICD-10-CM | POA: Diagnosis not present

## 2016-11-08 DIAGNOSIS — C342 Malignant neoplasm of middle lobe, bronchus or lung: Secondary | ICD-10-CM | POA: Diagnosis not present

## 2016-11-08 DIAGNOSIS — I129 Hypertensive chronic kidney disease with stage 1 through stage 4 chronic kidney disease, or unspecified chronic kidney disease: Secondary | ICD-10-CM | POA: Diagnosis not present

## 2016-11-08 DIAGNOSIS — I2510 Atherosclerotic heart disease of native coronary artery without angina pectoris: Secondary | ICD-10-CM | POA: Diagnosis not present

## 2016-11-08 DIAGNOSIS — E039 Hypothyroidism, unspecified: Secondary | ICD-10-CM | POA: Diagnosis not present

## 2016-11-09 ENCOUNTER — Ambulatory Visit
Admission: RE | Admit: 2016-11-09 | Discharge: 2016-11-09 | Disposition: A | Discharge status: Home / Self Care | Payer: Medicare Other | Source: Ambulatory Visit | Attending: Radiation Oncology | Admitting: Radiation Oncology

## 2016-11-09 ENCOUNTER — Encounter: Payer: Self-pay | Admitting: Internal Medicine

## 2016-11-09 DIAGNOSIS — I4891 Unspecified atrial fibrillation: Secondary | ICD-10-CM | POA: Diagnosis not present

## 2016-11-09 DIAGNOSIS — S329XXD Fracture of unspecified parts of lumbosacral spine and pelvis, subsequent encounter for fracture with routine healing: Secondary | ICD-10-CM | POA: Diagnosis not present

## 2016-11-09 DIAGNOSIS — I129 Hypertensive chronic kidney disease with stage 1 through stage 4 chronic kidney disease, or unspecified chronic kidney disease: Secondary | ICD-10-CM | POA: Diagnosis not present

## 2016-11-09 DIAGNOSIS — Z510 Encounter for antineoplastic radiation therapy: Secondary | ICD-10-CM | POA: Diagnosis not present

## 2016-11-09 DIAGNOSIS — I82409 Acute embolism and thrombosis of unspecified deep veins of unspecified lower extremity: Secondary | ICD-10-CM | POA: Diagnosis not present

## 2016-11-09 DIAGNOSIS — E039 Hypothyroidism, unspecified: Secondary | ICD-10-CM | POA: Diagnosis not present

## 2016-11-09 DIAGNOSIS — N390 Urinary tract infection, site not specified: Secondary | ICD-10-CM | POA: Diagnosis not present

## 2016-11-09 DIAGNOSIS — C8521 Mediastinal (thymic) large B-cell lymphoma, lymph nodes of head, face, and neck: Secondary | ICD-10-CM | POA: Diagnosis not present

## 2016-11-09 DIAGNOSIS — N189 Chronic kidney disease, unspecified: Secondary | ICD-10-CM | POA: Diagnosis not present

## 2016-11-09 DIAGNOSIS — I2510 Atherosclerotic heart disease of native coronary artery without angina pectoris: Secondary | ICD-10-CM | POA: Diagnosis not present

## 2016-11-09 DIAGNOSIS — C342 Malignant neoplasm of middle lobe, bronchus or lung: Secondary | ICD-10-CM | POA: Diagnosis not present

## 2016-11-09 DIAGNOSIS — I739 Peripheral vascular disease, unspecified: Secondary | ICD-10-CM | POA: Diagnosis not present

## 2016-11-10 ENCOUNTER — Other Ambulatory Visit: Payer: Self-pay | Admitting: Urology

## 2016-11-10 ENCOUNTER — Ambulatory Visit
Admission: RE | Admit: 2016-11-10 | Discharge: 2016-11-10 | Disposition: A | Discharge status: Home / Self Care | Payer: Medicare Other | Source: Ambulatory Visit | Attending: Radiation Oncology | Admitting: Radiation Oncology

## 2016-11-10 ENCOUNTER — Emergency Department (HOSPITAL_COMMUNITY)
Admission: EM | Admit: 2016-11-10 | Discharge: 2016-11-10 | Disposition: A | Discharge status: Home / Self Care | Payer: Medicare Other | Attending: Emergency Medicine | Admitting: Emergency Medicine

## 2016-11-10 DIAGNOSIS — Z79899 Other long term (current) drug therapy: Secondary | ICD-10-CM | POA: Diagnosis not present

## 2016-11-10 DIAGNOSIS — I129 Hypertensive chronic kidney disease with stage 1 through stage 4 chronic kidney disease, or unspecified chronic kidney disease: Secondary | ICD-10-CM | POA: Diagnosis not present

## 2016-11-10 DIAGNOSIS — R338 Other retention of urine: Secondary | ICD-10-CM | POA: Diagnosis not present

## 2016-11-10 DIAGNOSIS — I252 Old myocardial infarction: Secondary | ICD-10-CM | POA: Insufficient documentation

## 2016-11-10 DIAGNOSIS — Z955 Presence of coronary angioplasty implant and graft: Secondary | ICD-10-CM | POA: Insufficient documentation

## 2016-11-10 DIAGNOSIS — Z85118 Personal history of other malignant neoplasm of bronchus and lung: Secondary | ICD-10-CM | POA: Insufficient documentation

## 2016-11-10 DIAGNOSIS — Z7901 Long term (current) use of anticoagulants: Secondary | ICD-10-CM | POA: Insufficient documentation

## 2016-11-10 DIAGNOSIS — E039 Hypothyroidism, unspecified: Secondary | ICD-10-CM | POA: Diagnosis not present

## 2016-11-10 DIAGNOSIS — Z96653 Presence of artificial knee joint, bilateral: Secondary | ICD-10-CM | POA: Diagnosis not present

## 2016-11-10 DIAGNOSIS — I2510 Atherosclerotic heart disease of native coronary artery without angina pectoris: Secondary | ICD-10-CM | POA: Insufficient documentation

## 2016-11-10 DIAGNOSIS — C342 Malignant neoplasm of middle lobe, bronchus or lung: Secondary | ICD-10-CM

## 2016-11-10 DIAGNOSIS — Z87891 Personal history of nicotine dependence: Secondary | ICD-10-CM | POA: Diagnosis not present

## 2016-11-10 DIAGNOSIS — J449 Chronic obstructive pulmonary disease, unspecified: Secondary | ICD-10-CM | POA: Insufficient documentation

## 2016-11-10 DIAGNOSIS — N189 Chronic kidney disease, unspecified: Secondary | ICD-10-CM | POA: Diagnosis not present

## 2016-11-10 DIAGNOSIS — R339 Retention of urine, unspecified: Secondary | ICD-10-CM | POA: Diagnosis not present

## 2016-11-10 DIAGNOSIS — Z510 Encounter for antineoplastic radiation therapy: Secondary | ICD-10-CM | POA: Diagnosis not present

## 2016-11-10 LAB — URINALYSIS, ROUTINE W REFLEX MICROSCOPIC
Bilirubin Urine: NEGATIVE
GLUCOSE, UA: NEGATIVE mg/dL
Ketones, ur: NEGATIVE mg/dL
Nitrite: NEGATIVE
PROTEIN: NEGATIVE mg/dL
Specific Gravity, Urine: 1.002 — ABNORMAL LOW (ref 1.005–1.030)
Squamous Epithelial / LPF: NONE SEEN
pH: 7 (ref 5.0–8.0)

## 2016-11-10 MED ORDER — SUCRALFATE 1 GM/10ML PO SUSP: 1.0000 g | Freq: Three times a day (TID) | ORAL | 1 refills | Status: DC

## 2016-11-10 NOTE — ED Triage Notes (Signed)
States he had foley removed this am, unable to void

## 2016-11-10 NOTE — ED Provider Notes (Signed)
[facility] DEPT Provider Note   CSN: [id number] Arrival date & time: 11/10/16  2046  By signing my name below, I, [name], attest that this documentation has been prepared under the direction and in the presence of [name], MD. Electronically Signed: [name], ED Scribe. 11/10/16. 9:00 PM.     History   Chief Complaint Chief Complaint  Patient presents with  . Urinary Retention    HPI [name] [name] is a 76 y.o. male who presents to the Emergency Department complaining of the inability to void since foley catheter removal today. Pt had short term catheter placement 10/28/16 that was removed today following a pelvic fracture 3 months ago. His long term foley was removed 10/28/16 and he had the same problem at that time as well. He reports associated lower abdominal pain. He denies additional complaints.   The history is provided by the patient and medical records. No language interpreter was used.    Past Medical History:  Diagnosis Date  . Adenocarcinoma of right lung, stage 3 ([place]) 09/20/2016  . Atrial fibrillation ([place])    no hx of reported at preop visit of 04/21/11   . CAD (coronary artery disease)   . Carotid artery occlusion    left carotid endarterectomy  . Chronic kidney disease    AAA repair with reimplant of renals   . CHRONIC OBSTRUCTIVE PULMONARY DISEASE    pt denied at visit of 04/21/11   . CORONARY ARTERY DISEASE   . Disorder of left sacroiliac joint 08/24/2016  . DIVERTICULOSIS OF COLON   . Encounter for antineoplastic chemotherapy 10/17/2016  . GERD   . H/O hiatal hernia   . Headache(784.0)    Hx: of years of years  . HYPERTENSION   . HYPOTHYROIDISM   . JOINT EFFUSION, KNEE   . KNEE, ARTHRITIS, DEGEN./OSTEO    right knee   . Myocardial infarction ([place])    1994   . NEPHROLITHIASIS, HX OF   . OSTEOPOROSIS   . Other dysphagia   . PERIPHERAL VASCULAR DISEASE    AAA - 1994 with reimplant of renals   . Peripheral vascular disease ([place])    subclavian stenosis PTA - 3/08   . Pure hypercholesterolemia   . Renal artery stenosis [facility])     Patient Active Problem List   Diagnosis Date Noted  . Encounter for antineoplastic chemotherapy 10/17/2016  . Malnutrition of moderate degree 10/06/2016  . Urinary tract infection 10/03/2016  . T1b N2 M0 disease (stage IIIA).adenocarcinoma of the right middle lobe of lung ([place]) 09/20/2016  . Peripheral edema   . Slow transit constipation   . Acute lower UTI   . Confusion   . SOB (shortness of breath) 09/02/2016  . Urinary incontinence   . Abnormal urinalysis   . Leukocytosis   . HCAP (healthcare-associated pneumonia)   . Hypoalbuminemia due to protein-calorie malnutrition ([place])   . Hypokalemia   . Hyponatremia   . Urinary retention   . Sleep disturbance   . Acute bilateral deep vein thrombosis (DVT) of popliteal veins ([facility])   . Brain mass 08/31/2016  . Debility   . Benign essential HTN   . Scrotal edema   . Hyperlipidemia   . Constipation due to pain medication   . Fall from height of greater than 3 feet   . Acute blood loss anemia   . Lung mass   . Post-operative pain   . PVD (peripheral vascular disease) ([place])   . Coronary artery disease involving  coronary bypass graft of native heart without angina pectoris   . PAF (paroxysmal atrial fibrillation) ([place])   . Disorder of left sacroiliac joint 08/24/2016  . Fall 08/22/2016  . Closed pelvic ring fracture ([place])   . Carotid artery stenosis, asymptomatic 08/12/2014  . Ejection murmur 07/03/2014  . Aftercare following surgery of the circulatory system, [place] 02/11/2014  . Weakness-Abdomin and Bilateral Thigh / Leg 02/11/2014  . Carotid stenosis 07/19/2013  . Occlusion and stenosis of carotid artery without mention of cerebral infarction 07/16/2013  . Impaired fasting glucose 07/14/2013  . Knee pain 05/23/2011  . Stiffness of joint, not elsewhere classified, lower leg 05/23/2011  . Muscle weakness (generalized) 05/23/2011  .  Difficulty in walking(719.7) 05/23/2011  . Bruit 11/19/2010  . AAA (abdominal aortic aneurysm) ([place]) 11/19/2010  . OTHER DYSPHAGIA 04/27/2010  . ATRIAL FIBRILLATION 12/10/2008  . JOINT EFFUSION, KNEE 01/14/2008  . KNEE PAIN 01/14/2008  . KNEE, ARTHRITIS, DEGEN./OSTEO 12/11/2007  . Hypothyroidism 10/10/2007  . Pure hypercholesterolemia 10/10/2007  . Essential hypertension 10/10/2007  . Coronary atherosclerosis 10/10/2007  . Peripheral vascular disease ([place]) 10/10/2007  . CHRONIC OBSTRUCTIVE PULMONARY DISEASE 10/10/2007  . GERD 10/10/2007  . DIVERTICULOSIS OF COLON 10/10/2007  . OSTEOPOROSIS 10/10/2007  . NEPHROLITHIASIS, HX OF 10/10/2007  . Personal history of surgery to heart and great vessels, presenting hazards to health 10/10/2007  . CHOLECYSTECTOMY, HX OF 10/10/2007  . CORONARY ARTERY BYPASS GRAFT, HX OF 10/10/2007    Past Surgical History:  Procedure Laterality Date  . ABDOMINAL AORTIC ANEURYSM REPAIR     with reimplantation of renals   . CARDIAC CATHETERIZATION     2008  . CAROTID ENDARTERECTOMY Left Jan. 30, 2015   CEA  . CHOLECYSTECTOMY  2000   Gall Bladder  . COLONOSCOPY W/ BIOPSIES AND POLYPECTOMY     Hx: of  . CORONARY ARTERY BYPASS GRAFT     1995  . CORONARY STENT PLACEMENT     Hx: of  . ENDARTERECTOMY Left 07/19/2013   Procedure: ENDARTERECTOMY CAROTID;  Surgeon: Mal Misty, MD;  Location: [place] [facility];  Service: Vascular;  Laterality: Left;  . GALLBLADDER SURGERY  2004   . JOINT REPLACEMENT     partial knee replacement on left 2002   . KNEE SURGERY    . ORIF PELVIC FRACTURE Left 08/25/2016   Procedure: OPEN REDUCTION INTERNAL FIXATION (ORIF) PELVIC FRACTURE; plate on front, SI screw on the back;  Surgeon: Altamese Terril, MD;  Location: [place];  Service: Orthopedics;  Laterality: Left;  . OTHER SURGICAL HISTORY     left subclavian stenosis surgery PTA 08/2006   . OTHER SURGICAL HISTORY     carotid surgery on right 2004   . TOTAL KNEE ARTHROPLASTY  04/29/2011    Procedure: TOTAL KNEE ARTHROPLASTY;  Surgeon: Gearlean Alf;  Location: [facility];  Service: Orthopedics;  Laterality: Right;  . urinary retention    . VASCULAR SURGERY     AAA  . VASECTOMY  1973  . VIDEO BRONCHOSCOPY WITH ENDOBRONCHIAL ULTRASOUND [name] 09/09/2016   Procedure: VIDEO BRONCHOSCOPY WITH ENDOBRONCHIAL ULTRASOUND;  Surgeon: Grace Isaac, MD;  Location: [facility];  Service: Thoracic;  Laterality: [name];       Home Medications    Prior to Admission medications   Medication Sig Start Date End Date Taking? Authorizing Provider  acetaminophen (TYLENOL) 325 MG tablet Take 325-650 mg by mouth every 6 (six) hours as needed for headache (for pain).     [provider]  ALPRAZolam Duanne Moron)  0.25 MG tablet Take 1 tablet (0.25 mg total) by mouth 3 (three) times daily as needed for anxiety. 09/16/16   Angiulli, Lavon Paganini, PA-C  bumetanide (BUMEX) 1 MG tablet Take 1 tablet (1 mg total) by mouth daily as needed (swelling). 10/07/16   Kathie Dike, MD  lansoprazole (PREVACID) 15 MG capsule Take 1 capsule (15 mg total) by mouth daily. 09/16/16   Angiulli, Lavon Paganini, PA-C  levothyroxine (SYNTHROID, LEVOTHROID) 112 MCG tablet Take 1 tablet (112 mcg total) by mouth daily. 09/16/16   Angiulli, Lavon Paganini, PA-C  linaclotide [facility]) 145 MCG CAPS capsule Take 1 capsule (145 mcg total) by mouth daily before breakfast. 09/17/16   Angiulli, Lavon Paganini, PA-C  metoprolol (LOPRESSOR) 25 MG tablet Take 0.5 tablets (12.5 mg total) by mouth 2 (two) times daily. 09/16/16   Angiulli, Lavon Paganini, PA-C  niacin (SLO-NIACIN) 500 MG tablet Take 3 tablets (1,500 mg total) by mouth at bedtime. 09/16/16   Angiulli, Lavon Paganini, PA-C  nitroGLYCERIN (NITROSTAT) 0.4 MG SL tablet Place 1 tablet (0.4 mg total) under the tongue every 5 (five) minutes as needed for chest pain. Patient not taking: Reported on 10/31/2016 09/16/16   Angiulli, Lavon Paganini, PA-C  ondansetron (ZOFRAN) 8 MG tablet Take 1 tablet (8 mg total) by mouth every 8 (eight)  hours as needed for nausea or vomiting. Patient not taking: Reported on 10/31/2016 09/29/16   Curt Bears, MD  potassium chloride SA (K-DUR,KLOR-CON) 20 MEQ tablet Take 1 tablet (20 mEq total) by mouth daily. 10/20/16   [name], MD  prochlorperazine (COMPAZINE) 10 MG tablet Take 1 tablet (10 mg total) by mouth every 6 (six) hours as needed for nausea or vomiting. Patient not taking: Reported on 10/31/2016 10/17/16   Curt Bears, MD  rosuvastatin (CRESTOR) 20 MG tablet Take 1 tablet (20 mg total) by mouth daily. 09/16/16   Angiulli, Lavon Paganini, PA-C  sucralfate (CARAFATE) 1 GM/10ML suspension Take 10 mLs (1 g total) by mouth 4 (four) times daily -  with meals and at bedtime. 11/10/16   Bruning, Ashlyn, PA-C  tamsulosin (FLOMAX) 0.4 MG CAPS capsule Take 1 capsule (0.4 mg total) by mouth daily after breakfast. 09/29/16   Jamse Arn, MD  warfarin (COUMADIN) 5 MG tablet 1-1/2 tablet every day except 2 tablets Tuesday Thursday Saturday 09/16/16   Angiulli, Lavon Paganini, PA-C    Family History Family History  Problem Relation Age of Onset  . Hypertension Mother   . Heart disease Father   . Heart attack Father   . Diabetes Sister   . Cancer Brother 74       Lung  . Diabetes Sister     Social History Social History  Substance Use Topics  . Smoking status: Former Smoker    Types: Cigarettes    Quit date: 06/20/1992  . Smokeless tobacco: Never Used  . Alcohol use No     Allergies   Neurontin [gabapentin]; Imitrex [sumatriptan]; and Lipitor [atorvastatin]   Review of Systems Review of Systems  Gastrointestinal: Positive for abdominal pain.  Genitourinary: Positive for difficulty urinating.  All other systems reviewed and are negative.    Physical Exam Updated Vital Signs BP 123/71 (BP Location: Right Arm)   Pulse 80   Temp 98.7 F (37.1 C) (Temporal)   Resp (!) 22   SpO2 98%   Physical Exam  Constitutional: He is oriented to person, place, and time. He appears  well-developed and well-nourished.  Appears mildly uncomfortable  HENT:  Head:  Normocephalic and atraumatic.  Eyes: EOM are normal. Pupils are equal, round, and reactive to light.  Neck: Normal range of motion. Neck supple. No JVD present.  Cardiovascular: Normal rate, regular rhythm and normal heart sounds.   No murmur heard. Pulmonary/Chest: Effort normal and breath sounds normal. He has no wheezes. He has no rales. He exhibits no tenderness.  Abdominal: Soft. Bowel sounds are normal. He exhibits no distension and no mass. There is no tenderness.  Bladder distended to the umbilicus   Musculoskeletal: Normal range of motion. He exhibits no edema.  Lymphadenopathy:    He has no cervical adenopathy.  Neurological: He is alert and oriented to person, place, and time. No cranial nerve deficit. He exhibits normal muscle tone. Coordination normal.  Skin: Skin is warm and dry. No rash noted.  Psychiatric: He has a normal mood and affect. His behavior is normal. Judgment and thought content normal.  Nursing note and vitals reviewed.    ED Treatments / Results   DIAGNOSTIC STUDIES: Oxygen Saturation is 98% on RA, normal by my interpretation.    COORDINATION OF CARE: 8:58 PM Discussed treatment plan with pt at bedside which includes catheter placement and pt agreed to plan.    Labs (all labs ordered are listed, but only abnormal results are displayed) Labs Reviewed  URINALYSIS, ROUTINE W REFLEX MICROSCOPIC - Abnormal; Notable for the following:       Result Value   Color, Urine STRAW (*)    Specific Gravity, Urine 1.002 (*)    Hgb urine dipstick SMALL (*)    Leukocytes, UA TRACE (*)    Bacteria, UA RARE (*)    All other components within normal limits  URINE CULTURE     Procedures Procedures (including critical care time)  Medications Ordered in ED Medications - No data to display   Initial Impression / Assessment and Plan / ED Course  I have reviewed the triage vital  signs and the nursing notes.  Pertinent labs & imaging results that were available during my care of the patient were reviewed by me and considered in my medical decision making (see chart for details).  Recurrent acute urinary retention. Review of old records shows he had been discharged from the hospital with a catheter, and had been in the emergency department on May 11 after failed attempt to remove the catheter. Foley catheter is placed draining clear urine. Urinalysis shows no evidence of infection. He is discharged with instructions to follow-up with his urologist.  Final Clinical Impressions(s) / ED Diagnoses   Final diagnoses:  Urinary retention    New Prescriptions New Prescriptions   No medications on file    I personally performed the services described in this documentation, which was scribed in my presence. The recorded information has been reviewed and is accurate.       [name], MD 54/56/25 [id number]

## 2016-11-11 ENCOUNTER — Ambulatory Visit
Admission: RE | Admit: 2016-11-11 | Discharge: 2016-11-11 | Disposition: A | Discharge status: Home / Self Care | Payer: Medicare Other | Source: Ambulatory Visit | Attending: Radiation Oncology | Admitting: Radiation Oncology

## 2016-11-11 DIAGNOSIS — N189 Chronic kidney disease, unspecified: Secondary | ICD-10-CM | POA: Diagnosis not present

## 2016-11-11 DIAGNOSIS — C342 Malignant neoplasm of middle lobe, bronchus or lung: Secondary | ICD-10-CM | POA: Diagnosis not present

## 2016-11-11 DIAGNOSIS — I2510 Atherosclerotic heart disease of native coronary artery without angina pectoris: Secondary | ICD-10-CM | POA: Diagnosis not present

## 2016-11-11 DIAGNOSIS — S329XXD Fracture of unspecified parts of lumbosacral spine and pelvis, subsequent encounter for fracture with routine healing: Secondary | ICD-10-CM | POA: Diagnosis not present

## 2016-11-11 DIAGNOSIS — N390 Urinary tract infection, site not specified: Secondary | ICD-10-CM | POA: Diagnosis not present

## 2016-11-11 DIAGNOSIS — I739 Peripheral vascular disease, unspecified: Secondary | ICD-10-CM | POA: Diagnosis not present

## 2016-11-11 DIAGNOSIS — E039 Hypothyroidism, unspecified: Secondary | ICD-10-CM | POA: Diagnosis not present

## 2016-11-11 DIAGNOSIS — C8521 Mediastinal (thymic) large B-cell lymphoma, lymph nodes of head, face, and neck: Secondary | ICD-10-CM | POA: Diagnosis not present

## 2016-11-11 DIAGNOSIS — I4891 Unspecified atrial fibrillation: Secondary | ICD-10-CM | POA: Diagnosis not present

## 2016-11-11 DIAGNOSIS — I129 Hypertensive chronic kidney disease with stage 1 through stage 4 chronic kidney disease, or unspecified chronic kidney disease: Secondary | ICD-10-CM | POA: Diagnosis not present

## 2016-11-11 DIAGNOSIS — I82409 Acute embolism and thrombosis of unspecified deep veins of unspecified lower extremity: Secondary | ICD-10-CM | POA: Diagnosis not present

## 2016-11-11 DIAGNOSIS — Z510 Encounter for antineoplastic radiation therapy: Secondary | ICD-10-CM | POA: Diagnosis not present

## 2016-11-13 LAB — URINE CULTURE

## 2016-11-14 ENCOUNTER — Telehealth: Payer: Self-pay | Admitting: Emergency Medicine

## 2016-11-14 DIAGNOSIS — I82409 Acute embolism and thrombosis of unspecified deep veins of unspecified lower extremity: Secondary | ICD-10-CM | POA: Diagnosis not present

## 2016-11-14 DIAGNOSIS — I4891 Unspecified atrial fibrillation: Secondary | ICD-10-CM | POA: Diagnosis not present

## 2016-11-14 DIAGNOSIS — C8521 Mediastinal (thymic) large B-cell lymphoma, lymph nodes of head, face, and neck: Secondary | ICD-10-CM | POA: Diagnosis not present

## 2016-11-14 DIAGNOSIS — N390 Urinary tract infection, site not specified: Secondary | ICD-10-CM | POA: Diagnosis not present

## 2016-11-14 DIAGNOSIS — S329XXD Fracture of unspecified parts of lumbosacral spine and pelvis, subsequent encounter for fracture with routine healing: Secondary | ICD-10-CM | POA: Diagnosis not present

## 2016-11-14 DIAGNOSIS — I739 Peripheral vascular disease, unspecified: Secondary | ICD-10-CM | POA: Diagnosis not present

## 2016-11-14 NOTE — Telephone Encounter (Signed)
Post ED Visit - Positive Culture Follow-up  Culture report reviewed by antimicrobial stewardship pharmacist:  []  Elenor Quinones, Pharm.D. []  Heide Guile, Pharm.D., BCPS AQ-ID []  Parks Neptune, Pharm.D., BCPS []  Alycia Rossetti, Pharm.D., BCPS []  [place], Pharm.D., BCPS, AAHIVP []  Legrand Como, Pharm.D., BCPS, AAHIVP [x]  Salome Arnt, PharmD, BCPS []  Dimitri Ped, PharmD, BCPS []  Vincenza Hews, PharmD, BCPS  Positive urine culture Treated with none, 40,000 colonies , no further patient follow-up is required at this time.  Hazle Nordmann 11/14/2016, 1:05 PM

## 2016-11-15 ENCOUNTER — Encounter: Payer: Self-pay | Admitting: Internal Medicine

## 2016-11-15 ENCOUNTER — Telehealth: Payer: Self-pay | Admitting: Family Medicine

## 2016-11-15 ENCOUNTER — Telehealth: Payer: Self-pay | Admitting: Radiation Oncology

## 2016-11-15 ENCOUNTER — Other Ambulatory Visit: Payer: Self-pay | Admitting: *Deleted

## 2016-11-15 ENCOUNTER — Other Ambulatory Visit (HOSPITAL_BASED_OUTPATIENT_CLINIC_OR_DEPARTMENT_OTHER): Payer: Medicare Other

## 2016-11-15 ENCOUNTER — Ambulatory Visit (HOSPITAL_BASED_OUTPATIENT_CLINIC_OR_DEPARTMENT_OTHER): Payer: Medicare Other | Admitting: Internal Medicine

## 2016-11-15 ENCOUNTER — Ambulatory Visit (HOSPITAL_BASED_OUTPATIENT_CLINIC_OR_DEPARTMENT_OTHER): Payer: Medicare Other

## 2016-11-15 ENCOUNTER — Ambulatory Visit
Admission: RE | Admit: 2016-11-15 | Discharge: 2016-11-15 | Disposition: A | Discharge status: Home / Self Care | Payer: Medicare Other | Source: Ambulatory Visit | Attending: Radiation Oncology | Admitting: Radiation Oncology

## 2016-11-15 VITALS — BP 95/49 | HR 78 | Temp 97.7°F | Resp 18 | Ht 67.0 in | Wt 141.5 lb

## 2016-11-15 VITALS — BP 143/66 | HR 80 | Resp 18

## 2016-11-15 DIAGNOSIS — C3411 Malignant neoplasm of upper lobe, right bronchus or lung: Secondary | ICD-10-CM

## 2016-11-15 DIAGNOSIS — Z5111 Encounter for antineoplastic chemotherapy: Secondary | ICD-10-CM

## 2016-11-15 DIAGNOSIS — E860 Dehydration: Secondary | ICD-10-CM

## 2016-11-15 DIAGNOSIS — I2510 Atherosclerotic heart disease of native coronary artery without angina pectoris: Secondary | ICD-10-CM | POA: Diagnosis not present

## 2016-11-15 DIAGNOSIS — E039 Hypothyroidism, unspecified: Secondary | ICD-10-CM | POA: Diagnosis not present

## 2016-11-15 DIAGNOSIS — C3491 Malignant neoplasm of unspecified part of right bronchus or lung: Secondary | ICD-10-CM

## 2016-11-15 DIAGNOSIS — C342 Malignant neoplasm of middle lobe, bronchus or lung: Secondary | ICD-10-CM | POA: Diagnosis not present

## 2016-11-15 DIAGNOSIS — N189 Chronic kidney disease, unspecified: Secondary | ICD-10-CM | POA: Diagnosis not present

## 2016-11-15 DIAGNOSIS — I129 Hypertensive chronic kidney disease with stage 1 through stage 4 chronic kidney disease, or unspecified chronic kidney disease: Secondary | ICD-10-CM | POA: Diagnosis not present

## 2016-11-15 DIAGNOSIS — Z510 Encounter for antineoplastic radiation therapy: Secondary | ICD-10-CM | POA: Diagnosis not present

## 2016-11-15 HISTORY — DX: Dehydration: E86.0

## 2016-11-15 LAB — COMPREHENSIVE METABOLIC PANEL
ALK PHOS: 104 U/L (ref 40–150)
ALT: 24 U/L (ref 0–55)
AST: 25 U/L (ref 5–34)
Albumin: 3.4 g/dL — ABNORMAL LOW (ref 3.5–5.0)
Anion Gap: 10 mEq/L (ref 3–11)
BILIRUBIN TOTAL: 0.64 mg/dL (ref 0.20–1.20)
BUN: 12.8 mg/dL (ref 7.0–26.0)
CO2: 24 meq/L (ref 22–29)
Calcium: 9.4 mg/dL (ref 8.4–10.4)
Chloride: 100 mEq/L (ref 98–109)
Creatinine: 0.7 mg/dL (ref 0.7–1.3)
EGFR: 88 mL/min/{1.73_m2} — ABNORMAL LOW (ref >90)
GLUCOSE: 104 mg/dL (ref 70–140)
Potassium: 4.3 mEq/L (ref 3.5–5.1)
SODIUM: 134 meq/L — AB (ref 136–145)
TOTAL PROTEIN: 6.5 g/dL (ref 6.4–8.3)

## 2016-11-15 LAB — CBC WITH DIFFERENTIAL/PLATELET
BASO%: 0.8 % (ref 0.0–2.0)
Basophils Absolute: 0 10*3/uL (ref 0.0–0.1)
EOS ABS: 0 10*3/uL (ref 0.0–0.5)
EOS%: 0.2 % (ref 0.0–7.0)
HCT: 35.8 % — ABNORMAL LOW (ref 38.4–49.9)
HGB: 12.4 g/dL — ABNORMAL LOW (ref 13.0–17.1)
LYMPH%: 20.8 % (ref 14.0–49.0)
MCH: 29.4 pg (ref 27.2–33.4)
MCHC: 34.6 g/dL (ref 32.0–36.0)
MCV: 84.8 fL (ref 79.3–98.0)
MONO#: 0.3 10*3/uL (ref 0.1–0.9)
MONO%: 6.7 % (ref 0.0–14.0)
NEUT%: 71.5 % (ref 39.0–75.0)
NEUTROS ABS: 3.4 10*3/uL (ref 1.5–6.5)
Platelets: 152 10*3/uL (ref 140–400)
RBC: 4.23 10*6/uL (ref 4.20–5.82)
RDW: 18.3 % — ABNORMAL HIGH (ref 11.0–14.6)
WBC: 4.7 10*3/uL (ref 4.0–10.3)
lymph#: 1 10*3/uL (ref 0.9–3.3)

## 2016-11-15 MED ORDER — METOPROLOL TARTRATE 25 MG PO TABS: 12.5000 mg | ORAL_TABLET | Freq: Two times a day (BID) | ORAL | 0 refills | Status: DC

## 2016-11-15 MED ORDER — PALONOSETRON HCL INJECTION 0.25 MG/5ML
INTRAVENOUS | Status: AC
  Filled 2016-11-15: qty 5

## 2016-11-15 MED ORDER — PALONOSETRON HCL INJECTION 0.25 MG/5ML
0.2500 mg | Freq: Once | INTRAVENOUS | Status: AC
  Administered 2016-11-15: 0.25 mg via INTRAVENOUS

## 2016-11-15 MED ORDER — PACLITAXEL CHEMO INJECTION 300 MG/50ML
45.0000 mg/m2 | Freq: Once | INTRAVENOUS | Status: AC
  Administered 2016-11-15: 78 mg via INTRAVENOUS
  Filled 2016-11-15: qty 13

## 2016-11-15 MED ORDER — FAMOTIDINE IN NACL 20-0.9 MG/50ML-% IV SOLN
20.0000 mg | Freq: Once | INTRAVENOUS | Status: AC
  Administered 2016-11-15: 20 mg via INTRAVENOUS

## 2016-11-15 MED ORDER — SODIUM CHLORIDE 0.9 % IV SOLN
Freq: Once | INTRAVENOUS | Status: AC
  Administered 2016-11-15: 11:00:00 via INTRAVENOUS

## 2016-11-15 MED ORDER — SODIUM CHLORIDE 0.9 % IV SOLN
160.2000 mg | Freq: Once | INTRAVENOUS | Status: AC
  Administered 2016-11-15: 160 mg via INTRAVENOUS
  Filled 2016-11-15: qty 16

## 2016-11-15 MED ORDER — SODIUM CHLORIDE 0.9 % IV SOLN
1000.0000 mL | INTRAVENOUS | Status: DC
  Administered 2016-11-15: 1000 mL via INTRAVENOUS

## 2016-11-15 MED ORDER — SODIUM CHLORIDE 0.9 % IV SOLN
20.0000 mg | Freq: Once | INTRAVENOUS | Status: AC
  Administered 2016-11-15: 20 mg via INTRAVENOUS
  Filled 2016-11-15: qty 2

## 2016-11-15 MED ORDER — DIPHENHYDRAMINE HCL 50 MG/ML IJ SOLN
50.0000 mg | Freq: Once | INTRAMUSCULAR | Status: AC
Start: 2016-11-15 — End: 2016-11-15
  Administered 2016-11-15: 50 mg via INTRAVENOUS
  Filled 2016-11-15: qty 1

## 2016-11-15 MED ORDER — FAMOTIDINE IN NACL 20-0.9 MG/50ML-% IV SOLN
INTRAVENOUS | Status: AC
  Filled 2016-11-15: qty 50

## 2016-11-15 NOTE — Telephone Encounter (Signed)
Left message return call 11/15/16

## 2016-11-15 NOTE — Telephone Encounter (Signed)
[name] [name] with [facility] called requesting an order to continue foley catheter for urinary retention. FYI patient is set to see urology next week.   May reach [facility] at [phone]

## 2016-11-15 NOTE — Patient Instructions (Signed)
[facility] Discharge Instructions for Patients Receiving Chemotherapy  Today you received the following chemotherapy agents paclitaxel (Taxol) and carboplatin (Paraplatin).   To help prevent nausea and vomiting after your treatment, we encourage you to take your nausea medication as needed   If you develop nausea and vomiting that is not controlled by your nausea medication, call the clinic.   BELOW ARE SYMPTOMS THAT SHOULD BE REPORTED IMMEDIATELY:  *FEVER GREATER THAN 100.5 F  *CHILLS WITH OR WITHOUT FEVER  NAUSEA AND VOMITING THAT IS NOT CONTROLLED WITH YOUR NAUSEA MEDICATION  *UNUSUAL SHORTNESS OF BREATH  *UNUSUAL BRUISING OR BLEEDING  TENDERNESS IN MOUTH AND THROAT WITH OR WITHOUT PRESENCE OF ULCERS  *URINARY PROBLEMS  *BOWEL PROBLEMS  UNUSUAL RASH Items with * indicate a potential emergency and should be followed up as soon as possible.  Feel free to call the clinic you have any questions or concerns. The clinic phone number is [phone].  Please show the [facility] at check-in to the Emergency Department and triage nurse.

## 2016-11-15 NOTE — Telephone Encounter (Signed)
sure

## 2016-11-15 NOTE — Progress Notes (Signed)
[name] [phone]   Fax:([phone]  OFFICE PROGRESS NOTE  [name], MD [address] [place] [address]  DIAGNOSIS: Stage IIIA (T1b, N2, M0) non-small cell lung cancer, adenocarcinoma with positive PDL 1 expression 50% diagnosed in March 2018 and presented with right upper lobe lung nodule in addition to mediastinal lymphadenopathy.  PRIOR THERAPY: None.  CURRENT THERAPY: Course of concurrent chemoradiation with weekly carboplatin for AUC of 2 and paclitaxel 45 MG/M2. First dose of chemotherapy 10/17/2016. Status post 4 cycles.  INTERVAL HISTORY: [name] [name] 76 y.o. male returns to the clinic today for follow-up visit accompanied by his wife. The patient is feeling fine today was no specific complaints except for fatigue and some difficulty swallowing. He was given prescription for Carafate but this was denied by his insurance. He will talk to Dr. Tammi Klippel about alternative. He has poor by mouth intake recently. He continues to have the [facility] catheter for urinary retention. He has no fever or chills. He denied having any chest pain but continues to have shortness breath with exertion was no cough or hemoptysis. He is here today for evaluation before starting cycle #5 of his treatment.   MEDICAL HISTORY: Past Medical History:  Diagnosis Date  . Adenocarcinoma of right lung, stage 3 ([place]) 09/20/2016  . Atrial fibrillation ([place])    no hx of reported at preop visit of 04/21/11   . CAD (coronary artery disease)   . Carotid artery occlusion    left carotid endarterectomy  . Chronic kidney disease    AAA repair with reimplant of renals   . CHRONIC OBSTRUCTIVE PULMONARY DISEASE    pt denied at visit of 04/21/11   . CORONARY ARTERY DISEASE   . Disorder of left sacroiliac joint 08/24/2016  . DIVERTICULOSIS OF COLON   . Encounter for antineoplastic chemotherapy 10/17/2016  . GERD   . H/O hiatal hernia   . Headache(784.0)    Hx: of years of years  . HYPERTENSION   . HYPOTHYROIDISM   . JOINT EFFUSION, KNEE   . KNEE, ARTHRITIS, DEGEN./OSTEO    right knee   . Myocardial infarction ([place])    1994   . NEPHROLITHIASIS, HX OF   . OSTEOPOROSIS   . Other dysphagia   . PERIPHERAL VASCULAR DISEASE    AAA - 1994 with reimplant of renals   . Peripheral vascular disease ([place])    subclavian stenosis PTA - 3/08   . Pure hypercholesterolemia   . Renal artery stenosis ([facility])     ALLERGIES:  is allergic to neurontin [gabapentin]; imitrex [sumatriptan]; and lipitor [atorvastatin].  MEDICATIONS:  Current Outpatient Prescriptions  Medication Sig Dispense Refill  . acetaminophen (TYLENOL) 325 MG tablet Take 325-650 mg by mouth every 6 (six) hours as needed for headache (for pain).     [name] ALPRAZolam (XANAX) 0.25 MG tablet Take 1 tablet (0.25 mg total) by mouth 3 (three) times daily as needed for anxiety. 30 tablet 0  . bumetanide (BUMEX) 1 MG tablet Take 1 tablet (1 mg total) by mouth daily as needed (swelling). 30 tablet 1  . lansoprazole (PREVACID) 15 MG capsule Take 1 capsule (15 mg total) by mouth daily. 30 capsule 5  . levothyroxine (SYNTHROID, LEVOTHROID) 112 MCG tablet Take 1 tablet (112 mcg total) by mouth daily. 30 tablet 5  . linaclotide (LINZESS) 145 MCG CAPS capsule Take 1 capsule (145 mcg total) by mouth daily before breakfast. 30 capsule 1  .  metoprolol (LOPRESSOR) 25 MG tablet Take 0.5 tablets (12.5 mg total) by mouth 2 (two) times daily. 60 tablet 0  . niacin (SLO-NIACIN) 500 MG tablet Take 3 tablets (1,500 mg total) by mouth at bedtime. 30 tablet 0  . nitroGLYCERIN (NITROSTAT) 0.4 MG SL tablet Place 1 tablet (0.4 mg total) under the tongue every 5 (five) minutes as needed for chest pain. (Patient not taking: Reported on 10/31/2016) 25 tablet 10  . ondansetron (ZOFRAN) 8 MG tablet Take 1 tablet (8 mg total) by mouth every 8 (eight) hours as needed for nausea or vomiting. (Patient not taking: Reported on 10/31/2016)  20 tablet 0  . potassium chloride SA (K-DUR,KLOR-CON) 20 MEQ tablet Take 1 tablet (20 mEq total) by mouth daily. 30 tablet 11  . prochlorperazine (COMPAZINE) 10 MG tablet Take 1 tablet (10 mg total) by mouth every 6 (six) hours as needed for nausea or vomiting. (Patient not taking: Reported on 10/31/2016) 30 tablet 0  . rosuvastatin (CRESTOR) 20 MG tablet Take 1 tablet (20 mg total) by mouth daily. 30 tablet 5  . sucralfate (CARAFATE) 1 GM/10ML suspension Take 10 mLs (1 g total) by mouth 4 (four) times daily -  with meals and at bedtime. 420 mL 1  . tamsulosin (FLOMAX) 0.4 MG CAPS capsule Take 1 capsule (0.4 mg total) by mouth daily after breakfast. 30 capsule 1  . warfarin (COUMADIN) 5 MG tablet 1-1/2 tablet every day except 2 tablets Tuesday Thursday Saturday 90 tablet 1   No current facility-administered medications for this visit.     SURGICAL HISTORY:  Past Surgical History:  Procedure Laterality Date  . ABDOMINAL AORTIC ANEURYSM REPAIR     with reimplantation of renals   . CARDIAC CATHETERIZATION     2008  . CAROTID ENDARTERECTOMY Left Jan. 30, 2015   CEA  . CHOLECYSTECTOMY  2000   Gall Bladder  . COLONOSCOPY W/ BIOPSIES AND POLYPECTOMY     Hx: of  . CORONARY ARTERY BYPASS GRAFT     1995  . CORONARY STENT PLACEMENT     Hx: of  . ENDARTERECTOMY Left 07/19/2013   Procedure: ENDARTERECTOMY CAROTID;  Surgeon: Mal Misty, MD;  Location: [facility];  Service: Vascular;  Laterality: Left;  . GALLBLADDER SURGERY  2004   . JOINT REPLACEMENT     partial knee replacement on left 2002   . KNEE SURGERY    . ORIF PELVIC FRACTURE Left 08/25/2016   Procedure: OPEN REDUCTION INTERNAL FIXATION (ORIF) PELVIC FRACTURE; plate on front, SI screw on the back;  Surgeon: Altamese , MD;  Location: [place];  Service: Orthopedics;  Laterality: Left;  . OTHER SURGICAL HISTORY     left subclavian stenosis surgery PTA 08/2006   . OTHER SURGICAL HISTORY     carotid surgery on right 2004   . TOTAL KNEE  ARTHROPLASTY  04/29/2011   Procedure: TOTAL KNEE ARTHROPLASTY;  Surgeon: Gearlean Alf;  Location: [facility];  Service: Orthopedics;  Laterality: Right;  . urinary retention    . VASCULAR SURGERY     AAA  . VASECTOMY  1973  . VIDEO BRONCHOSCOPY WITH ENDOBRONCHIAL ULTRASOUND [name] 09/09/2016   Procedure: VIDEO BRONCHOSCOPY WITH ENDOBRONCHIAL ULTRASOUND;  Surgeon: Grace Isaac, MD;  [facility];  Service: Thoracic;  Laterality: [name];    REVIEW OF SYSTEMS:  A comprehensive review of systems was negative except for: Constitutional: positive for fatigue Respiratory: positive for dyspnea on exertion Genitourinary: positive for decreased stream Musculoskeletal: positive for muscle weakness  PHYSICAL EXAMINATION: General appearance: alert, cooperative, fatigued and no distress Head: Normocephalic, without obvious abnormality, atraumatic Neck: no adenopathy, no JVD, supple, symmetrical, trachea midline and thyroid not enlarged, symmetric, no tenderness/mass/nodules Lymph nodes: Cervical, supraclavicular, and axillary nodes normal. Resp: clear to auscultation bilaterally Back: symmetric, no curvature. ROM normal. No CVA tenderness. Cardio: regular rate and rhythm, S1, S2 normal, no murmur, click, rub or gallop GI: soft, non-tender; bowel sounds normal; no masses,  no organomegaly Extremities: extremities normal, atraumatic, no cyanosis or edema  ECOG PERFORMANCE STATUS: 1 - Symptomatic but completely ambulatory  Blood pressure (!) 95/49, pulse 78, temperature 97.7 F (36.5 C), temperature source Oral, resp. rate 18, height 5\' 7"  (1.702 m), weight 141 lb 8 oz (64.2 kg), SpO2 99 %.  LABORATORY DATA: Lab Results  Component Value Date   WBC 4.7 11/15/2016   HGB 12.4 (L) 11/15/2016   HCT 35.8 (L) 11/15/2016   MCV 84.8 11/15/2016   PLT 152 11/15/2016      Chemistry      Component Value Date/Time   NA 136 11/07/2016 1233   K 4.2 11/07/2016 1233   CL 100 (L) 10/06/2016 0611   CO2 24  11/07/2016 1233   BUN 9.8 11/07/2016 1233   CREATININE 0.8 11/07/2016 1233      Component Value Date/Time   CALCIUM 9.3 11/07/2016 1233   ALKPHOS 103 11/07/2016 1233   AST 29 11/07/2016 1233   ALT 28 11/07/2016 1233   BILITOT 0.52 11/07/2016 1233       RADIOGRAPHIC STUDIES: No results found.  ASSESSMENT AND PLAN:  This is a very pleasant 76 years old white male with a stage IIIa non-small cell lung cancer, adenocarcinoma with positive PDL 1 expression of 50%. The patient is currently undergoing concurrent chemoradiation with weekly carboplatin and paclitaxel, status post 4 cycles. He is tolerating the treatment well except for mild dysphagia. He is currently on Maalox and Prevacid. He was given prescription for Carafate but not covered by his insurance. I recommended for the patient to continue his treatment and he will proceed with cycle #5 today. For the dehydration, I will arrange for the patient to receive 1 L of normal saline today with the treatment. He would come back for follow-up visit in 2 weeks for evaluation before starting cycle #7. The patient was advised to call immediately if he has any concerning symptoms in the interval. The patient voices understanding of current disease status and treatment options and is in agreement with the current care plan. All questions were answered. The patient knows to call the clinic with any problems, questions or concerns. We can certainly see the patient much sooner if necessary. I spent 10 minutes counseling the patient face to face. The total time spent in the appointment was 15 minutes. Disclaimer: This note was dictated with voice recognition software. Similar sounding words can inadvertently be transcribed and may not be corrected upon review.

## 2016-11-15 NOTE — Telephone Encounter (Signed)
Received message from Su Hoff at [facility] that insurance would not cover the Carafate suspension. Called Tammy back. Gave her approval/verbal order for the Carafate tablets and provided instructions for them to be dissolved in 15 cc of water 15 minutes before meals and at bedtime. She verbalized understanding.

## 2016-11-16 ENCOUNTER — Ambulatory Visit
Admission: RE | Admit: 2016-11-16 | Discharge: 2016-11-16 | Disposition: A | Discharge status: Home / Self Care | Payer: Medicare Other | Source: Ambulatory Visit | Attending: Radiation Oncology | Admitting: Radiation Oncology

## 2016-11-16 ENCOUNTER — Telehealth: Payer: Self-pay | Admitting: Internal Medicine

## 2016-11-16 DIAGNOSIS — C8521 Mediastinal (thymic) large B-cell lymphoma, lymph nodes of head, face, and neck: Secondary | ICD-10-CM | POA: Diagnosis not present

## 2016-11-16 DIAGNOSIS — C342 Malignant neoplasm of middle lobe, bronchus or lung: Secondary | ICD-10-CM | POA: Diagnosis not present

## 2016-11-16 DIAGNOSIS — I82409 Acute embolism and thrombosis of unspecified deep veins of unspecified lower extremity: Secondary | ICD-10-CM | POA: Diagnosis not present

## 2016-11-16 DIAGNOSIS — N189 Chronic kidney disease, unspecified: Secondary | ICD-10-CM | POA: Diagnosis not present

## 2016-11-16 DIAGNOSIS — Z510 Encounter for antineoplastic radiation therapy: Secondary | ICD-10-CM | POA: Diagnosis not present

## 2016-11-16 DIAGNOSIS — I739 Peripheral vascular disease, unspecified: Secondary | ICD-10-CM | POA: Diagnosis not present

## 2016-11-16 DIAGNOSIS — I2510 Atherosclerotic heart disease of native coronary artery without angina pectoris: Secondary | ICD-10-CM | POA: Diagnosis not present

## 2016-11-16 DIAGNOSIS — E039 Hypothyroidism, unspecified: Secondary | ICD-10-CM | POA: Diagnosis not present

## 2016-11-16 DIAGNOSIS — I4891 Unspecified atrial fibrillation: Secondary | ICD-10-CM | POA: Diagnosis not present

## 2016-11-16 DIAGNOSIS — I129 Hypertensive chronic kidney disease with stage 1 through stage 4 chronic kidney disease, or unspecified chronic kidney disease: Secondary | ICD-10-CM | POA: Diagnosis not present

## 2016-11-16 DIAGNOSIS — S329XXD Fracture of unspecified parts of lumbosacral spine and pelvis, subsequent encounter for fracture with routine healing: Secondary | ICD-10-CM | POA: Diagnosis not present

## 2016-11-16 DIAGNOSIS — N390 Urinary tract infection, site not specified: Secondary | ICD-10-CM | POA: Diagnosis not present

## 2016-11-16 NOTE — Telephone Encounter (Signed)
Scheduled appt per 5/29 LOS and treatment plan - left message with appt date and time.

## 2016-11-16 NOTE — Telephone Encounter (Signed)
Spoke with Verdis Frederickson with [facility] and advised her per Dr.Steve Luking that they may continue foley catheter. Verdis Frederickson verbalized understanding.

## 2016-11-17 ENCOUNTER — Ambulatory Visit
Admission: RE | Admit: 2016-11-17 | Discharge: 2016-11-17 | Disposition: A | Discharge status: Home / Self Care | Payer: Medicare Other | Source: Ambulatory Visit | Attending: Radiation Oncology | Admitting: Radiation Oncology

## 2016-11-17 DIAGNOSIS — N390 Urinary tract infection, site not specified: Secondary | ICD-10-CM | POA: Diagnosis not present

## 2016-11-17 DIAGNOSIS — C342 Malignant neoplasm of middle lobe, bronchus or lung: Secondary | ICD-10-CM | POA: Diagnosis not present

## 2016-11-17 DIAGNOSIS — Z510 Encounter for antineoplastic radiation therapy: Secondary | ICD-10-CM | POA: Diagnosis not present

## 2016-11-17 DIAGNOSIS — I4891 Unspecified atrial fibrillation: Secondary | ICD-10-CM | POA: Diagnosis not present

## 2016-11-17 DIAGNOSIS — I129 Hypertensive chronic kidney disease with stage 1 through stage 4 chronic kidney disease, or unspecified chronic kidney disease: Secondary | ICD-10-CM | POA: Diagnosis not present

## 2016-11-17 DIAGNOSIS — N189 Chronic kidney disease, unspecified: Secondary | ICD-10-CM | POA: Diagnosis not present

## 2016-11-17 DIAGNOSIS — S329XXD Fracture of unspecified parts of lumbosacral spine and pelvis, subsequent encounter for fracture with routine healing: Secondary | ICD-10-CM | POA: Diagnosis not present

## 2016-11-17 DIAGNOSIS — C8521 Mediastinal (thymic) large B-cell lymphoma, lymph nodes of head, face, and neck: Secondary | ICD-10-CM | POA: Diagnosis not present

## 2016-11-17 DIAGNOSIS — I82409 Acute embolism and thrombosis of unspecified deep veins of unspecified lower extremity: Secondary | ICD-10-CM | POA: Diagnosis not present

## 2016-11-17 DIAGNOSIS — I739 Peripheral vascular disease, unspecified: Secondary | ICD-10-CM | POA: Diagnosis not present

## 2016-11-17 DIAGNOSIS — I2510 Atherosclerotic heart disease of native coronary artery without angina pectoris: Secondary | ICD-10-CM | POA: Diagnosis not present

## 2016-11-17 DIAGNOSIS — E039 Hypothyroidism, unspecified: Secondary | ICD-10-CM | POA: Diagnosis not present

## 2016-11-18 ENCOUNTER — Ambulatory Visit
Admission: RE | Admit: 2016-11-18 | Discharge: 2016-11-18 | Disposition: A | Discharge status: Home / Self Care | Payer: Medicare Other | Source: Ambulatory Visit | Attending: Radiation Oncology | Admitting: Radiation Oncology

## 2016-11-18 DIAGNOSIS — Z7901 Long term (current) use of anticoagulants: Secondary | ICD-10-CM | POA: Diagnosis not present

## 2016-11-18 DIAGNOSIS — Z466 Encounter for fitting and adjustment of urinary device: Secondary | ICD-10-CM | POA: Diagnosis not present

## 2016-11-18 DIAGNOSIS — E039 Hypothyroidism, unspecified: Secondary | ICD-10-CM | POA: Diagnosis not present

## 2016-11-18 DIAGNOSIS — C342 Malignant neoplasm of middle lobe, bronchus or lung: Secondary | ICD-10-CM | POA: Diagnosis not present

## 2016-11-18 DIAGNOSIS — I4891 Unspecified atrial fibrillation: Secondary | ICD-10-CM | POA: Diagnosis not present

## 2016-11-18 DIAGNOSIS — N189 Chronic kidney disease, unspecified: Secondary | ICD-10-CM | POA: Diagnosis not present

## 2016-11-18 DIAGNOSIS — I2510 Atherosclerotic heart disease of native coronary artery without angina pectoris: Secondary | ICD-10-CM | POA: Diagnosis not present

## 2016-11-18 DIAGNOSIS — S329XXD Fracture of unspecified parts of lumbosacral spine and pelvis, subsequent encounter for fracture with routine healing: Secondary | ICD-10-CM | POA: Diagnosis not present

## 2016-11-18 DIAGNOSIS — Z5181 Encounter for therapeutic drug level monitoring: Secondary | ICD-10-CM | POA: Diagnosis not present

## 2016-11-18 DIAGNOSIS — Z510 Encounter for antineoplastic radiation therapy: Secondary | ICD-10-CM | POA: Diagnosis not present

## 2016-11-18 DIAGNOSIS — D649 Anemia, unspecified: Secondary | ICD-10-CM | POA: Diagnosis not present

## 2016-11-18 DIAGNOSIS — I739 Peripheral vascular disease, unspecified: Secondary | ICD-10-CM | POA: Diagnosis not present

## 2016-11-18 DIAGNOSIS — I129 Hypertensive chronic kidney disease with stage 1 through stage 4 chronic kidney disease, or unspecified chronic kidney disease: Secondary | ICD-10-CM | POA: Diagnosis not present

## 2016-11-18 DIAGNOSIS — C8521 Mediastinal (thymic) large B-cell lymphoma, lymph nodes of head, face, and neck: Secondary | ICD-10-CM | POA: Diagnosis not present

## 2016-11-18 DIAGNOSIS — I509 Heart failure, unspecified: Secondary | ICD-10-CM | POA: Diagnosis not present

## 2016-11-18 DIAGNOSIS — Z9181 History of falling: Secondary | ICD-10-CM | POA: Diagnosis not present

## 2016-11-21 ENCOUNTER — Other Ambulatory Visit (HOSPITAL_BASED_OUTPATIENT_CLINIC_OR_DEPARTMENT_OTHER): Payer: Medicare Other

## 2016-11-21 ENCOUNTER — Ambulatory Visit: Payer: Medicare Other | Admitting: Nutrition

## 2016-11-21 ENCOUNTER — Ambulatory Visit (HOSPITAL_BASED_OUTPATIENT_CLINIC_OR_DEPARTMENT_OTHER): Payer: Medicare Other

## 2016-11-21 ENCOUNTER — Ambulatory Visit
Admission: RE | Admit: 2016-11-21 | Discharge: 2016-11-21 | Disposition: A | Discharge status: Home / Self Care | Payer: Medicare Other | Source: Ambulatory Visit | Attending: Radiation Oncology | Admitting: Radiation Oncology

## 2016-11-21 VITALS — BP 137/57 | HR 79 | Temp 98.1°F | Resp 18

## 2016-11-21 DIAGNOSIS — C3411 Malignant neoplasm of upper lobe, right bronchus or lung: Secondary | ICD-10-CM

## 2016-11-21 DIAGNOSIS — I129 Hypertensive chronic kidney disease with stage 1 through stage 4 chronic kidney disease, or unspecified chronic kidney disease: Secondary | ICD-10-CM | POA: Diagnosis not present

## 2016-11-21 DIAGNOSIS — C342 Malignant neoplasm of middle lobe, bronchus or lung: Secondary | ICD-10-CM

## 2016-11-21 DIAGNOSIS — Z510 Encounter for antineoplastic radiation therapy: Secondary | ICD-10-CM | POA: Diagnosis not present

## 2016-11-21 DIAGNOSIS — Z5111 Encounter for antineoplastic chemotherapy: Secondary | ICD-10-CM | POA: Diagnosis present

## 2016-11-21 DIAGNOSIS — E039 Hypothyroidism, unspecified: Secondary | ICD-10-CM | POA: Diagnosis not present

## 2016-11-21 DIAGNOSIS — I2510 Atherosclerotic heart disease of native coronary artery without angina pectoris: Secondary | ICD-10-CM | POA: Diagnosis not present

## 2016-11-21 DIAGNOSIS — C3491 Malignant neoplasm of unspecified part of right bronchus or lung: Secondary | ICD-10-CM

## 2016-11-21 DIAGNOSIS — N189 Chronic kidney disease, unspecified: Secondary | ICD-10-CM | POA: Diagnosis not present

## 2016-11-21 LAB — COMPREHENSIVE METABOLIC PANEL
ALBUMIN: 3 g/dL — AB (ref 3.5–5.0)
ALK PHOS: 92 U/L (ref 40–150)
ALT: 15 U/L (ref 0–55)
AST: 16 U/L (ref 5–34)
Anion Gap: 8 mEq/L (ref 3–11)
BUN: 9.4 mg/dL (ref 7.0–26.0)
CHLORIDE: 98 meq/L (ref 98–109)
CO2: 25 meq/L (ref 22–29)
Calcium: 9.5 mg/dL (ref 8.4–10.4)
Creatinine: 0.7 mg/dL (ref 0.7–1.3)
EGFR: 90 mL/min/{1.73_m2} — AB (ref >90)
GLUCOSE: 125 mg/dL (ref 70–140)
POTASSIUM: 3.7 meq/L (ref 3.5–5.1)
Sodium: 131 mEq/L — ABNORMAL LOW (ref 136–145)
Total Bilirubin: 0.77 mg/dL (ref 0.20–1.20)
Total Protein: 6.4 g/dL (ref 6.4–8.3)

## 2016-11-21 LAB — CBC WITH DIFFERENTIAL/PLATELET
BASO%: 0.4 % (ref 0.0–2.0)
BASOS ABS: 0 10*3/uL (ref 0.0–0.1)
EOS ABS: 0 10*3/uL (ref 0.0–0.5)
EOS%: 0.3 % (ref 0.0–7.0)
HCT: 32.9 % — ABNORMAL LOW (ref 38.4–49.9)
HGB: 11.4 g/dL — ABNORMAL LOW (ref 13.0–17.1)
LYMPH%: 10.1 % — AB (ref 14.0–49.0)
MCH: 29.6 pg (ref 27.2–33.4)
MCHC: 34.8 g/dL (ref 32.0–36.0)
MCV: 85.1 fL (ref 79.3–98.0)
MONO#: 0.3 10*3/uL (ref 0.1–0.9)
MONO%: 4.4 % (ref 0.0–14.0)
NEUT#: 5.6 10*3/uL (ref 1.5–6.5)
NEUT%: 84.8 % — AB (ref 39.0–75.0)
Platelets: 120 10*3/uL — ABNORMAL LOW (ref 140–400)
RBC: 3.86 10*6/uL — AB (ref 4.20–5.82)
RDW: 19.3 % — AB (ref 11.0–14.6)
WBC: 6.6 10*3/uL (ref 4.0–10.3)
lymph#: 0.7 10*3/uL — ABNORMAL LOW (ref 0.9–3.3)

## 2016-11-21 MED ORDER — FAMOTIDINE IN NACL 20-0.9 MG/50ML-% IV SOLN
INTRAVENOUS | Status: AC
Start: 2016-11-21 — End: ?
  Filled 2016-11-21: qty 50

## 2016-11-21 MED ORDER — SODIUM CHLORIDE 0.9 % IV SOLN
160.2000 mg | Freq: Once | INTRAVENOUS | Status: AC
  Administered 2016-11-21: 160 mg via INTRAVENOUS
  Filled 2016-11-21: qty 16

## 2016-11-21 MED ORDER — FAMOTIDINE IN NACL 20-0.9 MG/50ML-% IV SOLN
20.0000 mg | Freq: Once | INTRAVENOUS | Status: AC
  Administered 2016-11-21: 20 mg via INTRAVENOUS

## 2016-11-21 MED ORDER — SODIUM CHLORIDE 0.9 % IV SOLN
Freq: Once | INTRAVENOUS | Status: AC
  Administered 2016-11-21: 14:00:00 via INTRAVENOUS

## 2016-11-21 MED ORDER — PALONOSETRON HCL INJECTION 0.25 MG/5ML
INTRAVENOUS | Status: AC
  Filled 2016-11-21: qty 5

## 2016-11-21 MED ORDER — PALONOSETRON HCL INJECTION 0.25 MG/5ML
0.2500 mg | Freq: Once | INTRAVENOUS | Status: AC
  Administered 2016-11-21: 0.25 mg via INTRAVENOUS

## 2016-11-21 MED ORDER — SODIUM CHLORIDE 0.9 % IV SOLN
20.0000 mg | Freq: Once | INTRAVENOUS | Status: AC
  Administered 2016-11-21: 20 mg via INTRAVENOUS
  Filled 2016-11-21: qty 2

## 2016-11-21 MED ORDER — SODIUM CHLORIDE 0.9 % IV SOLN
50.0000 mg | Freq: Once | INTRAVENOUS | Status: AC
  Administered 2016-11-21: 50 mg via INTRAVENOUS
  Filled 2016-11-21: qty 1

## 2016-11-21 MED ORDER — PACLITAXEL CHEMO INJECTION 300 MG/50ML
45.0000 mg/m2 | Freq: Once | INTRAVENOUS | Status: AC
  Administered 2016-11-21: 78 mg via INTRAVENOUS
  Filled 2016-11-21: qty 13

## 2016-11-21 NOTE — Progress Notes (Signed)
Nutrition follow-up completed with patient receiving chemotherapy for lung cancer. Weight was documented as 141.5 pounds May 29. Patient reports constipation has improved. He does state he has difficulty swallowing but Carafate has been ordered.  He has been using it for 2 days. He continues to drink 2 oral nutrition supplements daily.  Nutrition diagnosis: Unintended weight loss continues.  Intervention: Educated patient to continue oral nutrition supplements twice a day Recommended he continue bowel regimen. Encouraged plenty of fluids. Provided support and encouragement. Questions were answered.  Teach back method used.  Monitoring, evaluation, goals: Patient will tolerate adequate calories and protein for weight maintenance.  Next visit: Monday, June 18, during infusion.  **Disclaimer: This note was dictated with voice recognition software. Similar sounding words can inadvertently be transcribed and this note may contain transcription errors which may not have been corrected upon publication of note.**

## 2016-11-21 NOTE — Patient Instructions (Signed)
[facility] Discharge Instructions for Patients Receiving Chemotherapy  Today you received the following chemotherapy agents paclitaxel (Taxol) and carboplatin (Paraplatin).   To help prevent nausea and vomiting after your treatment, we encourage you to take your nausea medication as needed   If you develop nausea and vomiting that is not controlled by your nausea medication, call the clinic.   BELOW ARE SYMPTOMS THAT SHOULD BE REPORTED IMMEDIATELY:  *FEVER GREATER THAN 100.5 F  *CHILLS WITH OR WITHOUT FEVER  NAUSEA AND VOMITING THAT IS NOT CONTROLLED WITH YOUR NAUSEA MEDICATION  *UNUSUAL SHORTNESS OF BREATH  *UNUSUAL BRUISING OR BLEEDING  TENDERNESS IN MOUTH AND THROAT WITH OR WITHOUT PRESENCE OF ULCERS  *URINARY PROBLEMS  *BOWEL PROBLEMS  UNUSUAL RASH Items with * indicate a potential emergency and should be followed up as soon as possible.  Feel free to call the clinic you have any questions or concerns. The clinic phone number is [phone].  Please show the [place] at check-in to the Emergency Department and triage nurse.

## 2016-11-22 ENCOUNTER — Ambulatory Visit
Admission: RE | Admit: 2016-11-22 | Discharge: 2016-11-22 | Disposition: A | Discharge status: Home / Self Care | Payer: Medicare Other | Source: Ambulatory Visit | Attending: Radiation Oncology | Admitting: Radiation Oncology

## 2016-11-22 DIAGNOSIS — I129 Hypertensive chronic kidney disease with stage 1 through stage 4 chronic kidney disease, or unspecified chronic kidney disease: Secondary | ICD-10-CM | POA: Diagnosis not present

## 2016-11-22 DIAGNOSIS — I2510 Atherosclerotic heart disease of native coronary artery without angina pectoris: Secondary | ICD-10-CM | POA: Diagnosis not present

## 2016-11-22 DIAGNOSIS — N189 Chronic kidney disease, unspecified: Secondary | ICD-10-CM | POA: Diagnosis not present

## 2016-11-22 DIAGNOSIS — Z510 Encounter for antineoplastic radiation therapy: Secondary | ICD-10-CM | POA: Diagnosis not present

## 2016-11-22 DIAGNOSIS — C342 Malignant neoplasm of middle lobe, bronchus or lung: Secondary | ICD-10-CM | POA: Diagnosis not present

## 2016-11-22 DIAGNOSIS — E039 Hypothyroidism, unspecified: Secondary | ICD-10-CM | POA: Diagnosis not present

## 2016-11-23 ENCOUNTER — Ambulatory Visit
Admission: RE | Admit: 2016-11-23 | Discharge: 2016-11-23 | Disposition: A | Discharge status: Home / Self Care | Payer: Medicare Other | Source: Ambulatory Visit | Attending: Radiation Oncology | Admitting: Radiation Oncology

## 2016-11-23 ENCOUNTER — Telehealth: Payer: Self-pay | Admitting: *Deleted

## 2016-11-23 ENCOUNTER — Other Ambulatory Visit (HOSPITAL_COMMUNITY)
Admission: RE | Admit: 2016-11-23 | Discharge: 2016-11-23 | Disposition: A | Discharge status: Home / Self Care | Payer: Medicare Other | Source: Other Acute Inpatient Hospital | Attending: Family Medicine | Admitting: Family Medicine

## 2016-11-23 DIAGNOSIS — R338 Other retention of urine: Secondary | ICD-10-CM | POA: Diagnosis not present

## 2016-11-23 DIAGNOSIS — Z466 Encounter for fitting and adjustment of urinary device: Secondary | ICD-10-CM | POA: Diagnosis not present

## 2016-11-23 DIAGNOSIS — Z5181 Encounter for therapeutic drug level monitoring: Secondary | ICD-10-CM | POA: Diagnosis not present

## 2016-11-23 DIAGNOSIS — I739 Peripheral vascular disease, unspecified: Secondary | ICD-10-CM | POA: Diagnosis not present

## 2016-11-23 DIAGNOSIS — I4891 Unspecified atrial fibrillation: Secondary | ICD-10-CM | POA: Diagnosis not present

## 2016-11-23 DIAGNOSIS — I2510 Atherosclerotic heart disease of native coronary artery without angina pectoris: Secondary | ICD-10-CM | POA: Diagnosis not present

## 2016-11-23 DIAGNOSIS — C342 Malignant neoplasm of middle lobe, bronchus or lung: Secondary | ICD-10-CM | POA: Diagnosis not present

## 2016-11-23 DIAGNOSIS — N189 Chronic kidney disease, unspecified: Secondary | ICD-10-CM | POA: Diagnosis not present

## 2016-11-23 DIAGNOSIS — I129 Hypertensive chronic kidney disease with stage 1 through stage 4 chronic kidney disease, or unspecified chronic kidney disease: Secondary | ICD-10-CM | POA: Diagnosis not present

## 2016-11-23 DIAGNOSIS — S329XXD Fracture of unspecified parts of lumbosacral spine and pelvis, subsequent encounter for fracture with routine healing: Secondary | ICD-10-CM | POA: Diagnosis not present

## 2016-11-23 DIAGNOSIS — E039 Hypothyroidism, unspecified: Secondary | ICD-10-CM | POA: Diagnosis not present

## 2016-11-23 DIAGNOSIS — C8521 Mediastinal (thymic) large B-cell lymphoma, lymph nodes of head, face, and neck: Secondary | ICD-10-CM | POA: Diagnosis not present

## 2016-11-23 DIAGNOSIS — Z510 Encounter for antineoplastic radiation therapy: Secondary | ICD-10-CM | POA: Diagnosis not present

## 2016-11-23 LAB — PROTIME-INR
INR: 5.46
Prothrombin Time: 51.3 seconds — ABNORMAL HIGH (ref 11.4–15.2)

## 2016-11-23 NOTE — Telephone Encounter (Signed)
Tammy at home health called to report critical INR on patient.  INR 5.46   Patient currently on Coumadin 7.5 mg Mon, Wed, Fri and Sun and Coumadin 10 mg Tues, Thurs and Sat     Consult with Dr Richardson Landry Hold Coumadin for now and recheck INR Friday am via home health- if any active bleeding go straight to the ER. Tammy at home health verbalized understanding.

## 2016-11-24 ENCOUNTER — Ambulatory Visit
Admission: RE | Admit: 2016-11-24 | Discharge: 2016-11-24 | Disposition: A | Discharge status: Home / Self Care | Payer: Medicare Other | Source: Ambulatory Visit | Attending: Radiation Oncology | Admitting: Radiation Oncology

## 2016-11-24 DIAGNOSIS — I129 Hypertensive chronic kidney disease with stage 1 through stage 4 chronic kidney disease, or unspecified chronic kidney disease: Secondary | ICD-10-CM | POA: Diagnosis not present

## 2016-11-24 DIAGNOSIS — I2510 Atherosclerotic heart disease of native coronary artery without angina pectoris: Secondary | ICD-10-CM | POA: Diagnosis not present

## 2016-11-24 DIAGNOSIS — E039 Hypothyroidism, unspecified: Secondary | ICD-10-CM | POA: Diagnosis not present

## 2016-11-24 DIAGNOSIS — Z510 Encounter for antineoplastic radiation therapy: Secondary | ICD-10-CM | POA: Diagnosis not present

## 2016-11-24 DIAGNOSIS — N189 Chronic kidney disease, unspecified: Secondary | ICD-10-CM | POA: Diagnosis not present

## 2016-11-24 DIAGNOSIS — C342 Malignant neoplasm of middle lobe, bronchus or lung: Secondary | ICD-10-CM | POA: Diagnosis not present

## 2016-11-25 ENCOUNTER — Ambulatory Visit
Admission: RE | Admit: 2016-11-25 | Discharge: 2016-11-25 | Disposition: A | Discharge status: Home / Self Care | Payer: Medicare Other | Source: Ambulatory Visit | Attending: Radiation Oncology | Admitting: Radiation Oncology

## 2016-11-25 ENCOUNTER — Telehealth: Payer: Self-pay | Admitting: *Deleted

## 2016-11-25 ENCOUNTER — Other Ambulatory Visit: Payer: Self-pay | Admitting: Medical Oncology

## 2016-11-25 DIAGNOSIS — S329XXD Fracture of unspecified parts of lumbosacral spine and pelvis, subsequent encounter for fracture with routine healing: Secondary | ICD-10-CM | POA: Diagnosis not present

## 2016-11-25 DIAGNOSIS — I4891 Unspecified atrial fibrillation: Secondary | ICD-10-CM | POA: Diagnosis not present

## 2016-11-25 DIAGNOSIS — I2510 Atherosclerotic heart disease of native coronary artery without angina pectoris: Secondary | ICD-10-CM | POA: Diagnosis not present

## 2016-11-25 DIAGNOSIS — C342 Malignant neoplasm of middle lobe, bronchus or lung: Secondary | ICD-10-CM

## 2016-11-25 DIAGNOSIS — Z510 Encounter for antineoplastic radiation therapy: Secondary | ICD-10-CM | POA: Diagnosis not present

## 2016-11-25 DIAGNOSIS — I129 Hypertensive chronic kidney disease with stage 1 through stage 4 chronic kidney disease, or unspecified chronic kidney disease: Secondary | ICD-10-CM | POA: Diagnosis not present

## 2016-11-25 DIAGNOSIS — N189 Chronic kidney disease, unspecified: Secondary | ICD-10-CM | POA: Diagnosis not present

## 2016-11-25 DIAGNOSIS — E039 Hypothyroidism, unspecified: Secondary | ICD-10-CM | POA: Diagnosis not present

## 2016-11-25 DIAGNOSIS — Z466 Encounter for fitting and adjustment of urinary device: Secondary | ICD-10-CM | POA: Diagnosis not present

## 2016-11-25 DIAGNOSIS — C8521 Mediastinal (thymic) large B-cell lymphoma, lymph nodes of head, face, and neck: Secondary | ICD-10-CM | POA: Diagnosis not present

## 2016-11-25 DIAGNOSIS — I739 Peripheral vascular disease, unspecified: Secondary | ICD-10-CM | POA: Diagnosis not present

## 2016-11-25 NOTE — Telephone Encounter (Signed)
[facility]ammy nurse from [facility] calling to report INR done today. 3.6. Coumadin was held since Tuesday when it was 5.46.  Consult with dr Richardson Landry. Continue to hold coumadin and recheck INR on Monday. Per dr Richardson Landry. Tammy with [facility] verbalized understanding.

## 2016-11-28 ENCOUNTER — Telehealth: Payer: Self-pay | Admitting: Internal Medicine

## 2016-11-28 ENCOUNTER — Ambulatory Visit
Admission: RE | Admit: 2016-11-28 | Discharge: 2016-11-28 | Disposition: A | Discharge status: Home / Self Care | Payer: Medicare Other | Source: Ambulatory Visit | Attending: Radiation Oncology | Admitting: Radiation Oncology

## 2016-11-28 ENCOUNTER — Encounter: Payer: Self-pay | Admitting: Internal Medicine

## 2016-11-28 ENCOUNTER — Other Ambulatory Visit (HOSPITAL_BASED_OUTPATIENT_CLINIC_OR_DEPARTMENT_OTHER): Payer: Medicare Other

## 2016-11-28 ENCOUNTER — Ambulatory Visit (HOSPITAL_BASED_OUTPATIENT_CLINIC_OR_DEPARTMENT_OTHER): Payer: Medicare Other | Admitting: Internal Medicine

## 2016-11-28 ENCOUNTER — Telehealth: Payer: Self-pay | Admitting: *Deleted

## 2016-11-28 ENCOUNTER — Ambulatory Visit (HOSPITAL_BASED_OUTPATIENT_CLINIC_OR_DEPARTMENT_OTHER): Payer: Medicare Other

## 2016-11-28 VITALS — BP 119/60 | HR 72 | Temp 98.1°F | Resp 18 | Ht 67.0 in | Wt 138.3 lb

## 2016-11-28 DIAGNOSIS — C342 Malignant neoplasm of middle lobe, bronchus or lung: Secondary | ICD-10-CM

## 2016-11-28 DIAGNOSIS — E039 Hypothyroidism, unspecified: Secondary | ICD-10-CM | POA: Diagnosis not present

## 2016-11-28 DIAGNOSIS — Z510 Encounter for antineoplastic radiation therapy: Secondary | ICD-10-CM | POA: Diagnosis not present

## 2016-11-28 DIAGNOSIS — C3411 Malignant neoplasm of upper lobe, right bronchus or lung: Secondary | ICD-10-CM | POA: Diagnosis not present

## 2016-11-28 DIAGNOSIS — Z466 Encounter for fitting and adjustment of urinary device: Secondary | ICD-10-CM | POA: Diagnosis not present

## 2016-11-28 DIAGNOSIS — I739 Peripheral vascular disease, unspecified: Secondary | ICD-10-CM | POA: Diagnosis not present

## 2016-11-28 DIAGNOSIS — I4891 Unspecified atrial fibrillation: Secondary | ICD-10-CM | POA: Diagnosis not present

## 2016-11-28 DIAGNOSIS — Z5111 Encounter for antineoplastic chemotherapy: Secondary | ICD-10-CM

## 2016-11-28 DIAGNOSIS — S329XXD Fracture of unspecified parts of lumbosacral spine and pelvis, subsequent encounter for fracture with routine healing: Secondary | ICD-10-CM | POA: Diagnosis not present

## 2016-11-28 DIAGNOSIS — N189 Chronic kidney disease, unspecified: Secondary | ICD-10-CM | POA: Diagnosis not present

## 2016-11-28 DIAGNOSIS — C8521 Mediastinal (thymic) large B-cell lymphoma, lymph nodes of head, face, and neck: Secondary | ICD-10-CM | POA: Diagnosis not present

## 2016-11-28 DIAGNOSIS — I129 Hypertensive chronic kidney disease with stage 1 through stage 4 chronic kidney disease, or unspecified chronic kidney disease: Secondary | ICD-10-CM | POA: Diagnosis not present

## 2016-11-28 DIAGNOSIS — I2510 Atherosclerotic heart disease of native coronary artery without angina pectoris: Secondary | ICD-10-CM | POA: Diagnosis not present

## 2016-11-28 LAB — CBC WITH DIFFERENTIAL/PLATELET
BASO%: 0.4 % (ref 0.0–2.0)
Basophils Absolute: 0 10*3/uL (ref 0.0–0.1)
EOS%: 1.5 % (ref 0.0–7.0)
Eosinophils Absolute: 0 10*3/uL (ref 0.0–0.5)
HEMATOCRIT: 29.3 % — AB (ref 38.4–49.9)
HEMOGLOBIN: 10.3 g/dL — AB (ref 13.0–17.1)
LYMPH#: 0.6 10*3/uL — AB (ref 0.9–3.3)
LYMPH%: 20.1 % (ref 14.0–49.0)
MCH: 30 pg (ref 27.2–33.4)
MCHC: 35.2 g/dL (ref 32.0–36.0)
MCV: 85.4 fL (ref 79.3–98.0)
MONO#: 0.2 10*3/uL (ref 0.1–0.9)
MONO%: 5.8 % (ref 0.0–14.0)
NEUT%: 72.2 % (ref 39.0–75.0)
NEUTROS ABS: 2 10*3/uL (ref 1.5–6.5)
Platelets: 101 10*3/uL — ABNORMAL LOW (ref 140–400)
RBC: 3.43 10*6/uL — ABNORMAL LOW (ref 4.20–5.82)
RDW: 18.7 % — AB (ref 11.0–14.6)
WBC: 2.7 10*3/uL — ABNORMAL LOW (ref 4.0–10.3)

## 2016-11-28 LAB — COMPREHENSIVE METABOLIC PANEL
ALBUMIN: 3.2 g/dL — AB (ref 3.5–5.0)
ALT: 18 U/L (ref 0–55)
AST: 23 U/L (ref 5–34)
Alkaline Phosphatase: 92 U/L (ref 40–150)
Anion Gap: 8 mEq/L (ref 3–11)
BILIRUBIN TOTAL: 0.75 mg/dL (ref 0.20–1.20)
BUN: 12.3 mg/dL (ref 7.0–26.0)
CALCIUM: 9.2 mg/dL (ref 8.4–10.4)
CHLORIDE: 95 meq/L — AB (ref 98–109)
CO2: 31 mEq/L — ABNORMAL HIGH (ref 22–29)
CREATININE: 0.8 mg/dL (ref 0.7–1.3)
EGFR: 87 mL/min/{1.73_m2} — ABNORMAL LOW (ref >90)
Glucose: 96 mg/dl (ref 70–140)
Potassium: 3.8 mEq/L (ref 3.5–5.1)
Sodium: 133 mEq/L — ABNORMAL LOW (ref 136–145)
Total Protein: 6.2 g/dL — ABNORMAL LOW (ref 6.4–8.3)

## 2016-11-28 MED ORDER — SODIUM CHLORIDE 0.9 % IV SOLN
Freq: Once | INTRAVENOUS | Status: AC
  Administered 2016-11-28: 14:00:00 via INTRAVENOUS

## 2016-11-28 MED ORDER — FAMOTIDINE IN NACL 20-0.9 MG/50ML-% IV SOLN
INTRAVENOUS | Status: AC
  Filled 2016-11-28: qty 50

## 2016-11-28 MED ORDER — SODIUM CHLORIDE 0.9 % IV SOLN
50.0000 mg | Freq: Once | INTRAVENOUS | Status: AC
  Administered 2016-11-28: 50 mg via INTRAVENOUS
  Filled 2016-11-28: qty 1

## 2016-11-28 MED ORDER — PALONOSETRON HCL INJECTION 0.25 MG/5ML
0.2500 mg | Freq: Once | INTRAVENOUS | Status: AC
  Administered 2016-11-28: 0.25 mg via INTRAVENOUS

## 2016-11-28 MED ORDER — PACLITAXEL CHEMO INJECTION 300 MG/50ML
45.0000 mg/m2 | Freq: Once | INTRAVENOUS | Status: AC
  Administered 2016-11-28: 78 mg via INTRAVENOUS
  Filled 2016-11-28: qty 13

## 2016-11-28 MED ORDER — SODIUM CHLORIDE 0.9 % IV SOLN
160.2000 mg | Freq: Once | INTRAVENOUS | Status: AC
  Administered 2016-11-28: 160 mg via INTRAVENOUS
  Filled 2016-11-28: qty 16

## 2016-11-28 MED ORDER — SODIUM CHLORIDE 0.9 % IV SOLN
20.0000 mg | Freq: Once | INTRAVENOUS | Status: AC
  Administered 2016-11-28: 20 mg via INTRAVENOUS
  Filled 2016-11-28: qty 2

## 2016-11-28 MED ORDER — DIPHENHYDRAMINE HCL 50 MG/ML IJ SOLN
INTRAMUSCULAR | Status: AC
  Filled 2016-11-28: qty 1

## 2016-11-28 MED ORDER — FAMOTIDINE IN NACL 20-0.9 MG/50ML-% IV SOLN
20.0000 mg | Freq: Once | INTRAVENOUS | Status: AC
  Administered 2016-11-28: 20 mg via INTRAVENOUS

## 2016-11-28 MED ORDER — PALONOSETRON HCL INJECTION 0.25 MG/5ML
INTRAVENOUS | Status: AC
  Filled 2016-11-28: qty 5

## 2016-11-28 NOTE — Patient Instructions (Signed)
[facility] Discharge Instructions for Patients Receiving Chemotherapy  Today you received the following chemotherapy agents Taxol and Carboplatin. To help prevent nausea and vomiting after your treatment, we encourage you to take your nausea medication as directed.  If you develop nausea and vomiting that is not controlled by your nausea medication, call the clinic.   BELOW ARE SYMPTOMS THAT SHOULD BE REPORTED IMMEDIATELY:  *FEVER GREATER THAN 100.5 F  *CHILLS WITH OR WITHOUT FEVER  NAUSEA AND VOMITING THAT IS NOT CONTROLLED WITH YOUR NAUSEA MEDICATION  *UNUSUAL SHORTNESS OF BREATH  *UNUSUAL BRUISING OR BLEEDING  TENDERNESS IN MOUTH AND THROAT WITH OR WITHOUT PRESENCE OF ULCERS  *URINARY PROBLEMS  *BOWEL PROBLEMS  UNUSUAL RASH Items with * indicate a potential emergency and should be followed up as soon as possible.  Feel free to call the clinic you have any questions or concerns. The clinic phone number is [phone].  Please show the CHEMO ALERT CARD at check-in to the Emergency Department and triage nurse.    

## 2016-11-28 NOTE — Progress Notes (Signed)
[facility] [phone]   [phone]  OFFICE PROGRESS NOTE  [name], MD [address] [place] [address]  DIAGNOSIS: Stage IIIA (T1b, N2, M0) non-small cell lung cancer, adenocarcinoma with positive PDL 1 expression 50% diagnosed in March 2018 and presented with right upper lobe lung nodule in addition to mediastinal lymphadenopathy.  PRIOR THERAPY: None.  CURRENT THERAPY: Course of concurrent chemoradiation with weekly carboplatin for AUC of 2 and paclitaxel 45 MG/M2. First dose of chemotherapy 10/17/2016. Status post 6 cycles.  INTERVAL HISTORY: [name] [name] 76 y.o. male returns to the clinic today for follow-up visit accompanied by his wife. The patient has been tolerating his treatment fairly well with no significant adverse effect except for mild odynophagia. He is currently on several medications including Carafate as well as hydrocodone for pain management. He continues to tolerate his systemic chemotherapy fairly well. He denied having any fever or chills. He has no nausea, vomiting, diarrhea or constipation. The patient denied having any recent weight loss or night sweats. He is here today for evaluation before starting cycle #7 of his chemotherapy.   MEDICAL HISTORY: Past Medical History:  Diagnosis Date  . Adenocarcinoma of right lung, stage 3 ([place]) 09/20/2016  . Atrial fibrillation ([place])    no hx of reported at preop visit of 04/21/11   . CAD (coronary artery disease)   . Carotid artery occlusion    left carotid endarterectomy  . Chronic kidney disease    AAA repair with reimplant of renals   . CHRONIC OBSTRUCTIVE PULMONARY DISEASE    pt denied at visit of 04/21/11   . CORONARY ARTERY DISEASE   . Dehydration 11/15/2016  . Disorder of left sacroiliac joint 08/24/2016  . DIVERTICULOSIS OF COLON   . Encounter for antineoplastic chemotherapy 10/17/2016  . GERD   . H/O hiatal hernia   . Headache(784.0)    Hx: of  years of years  . HYPERTENSION   . HYPOTHYROIDISM   . JOINT EFFUSION, KNEE   . KNEE, ARTHRITIS, DEGEN./OSTEO    right knee   . Myocardial infarction ([place])    1994   . NEPHROLITHIASIS, HX OF   . OSTEOPOROSIS   . Other dysphagia   . PERIPHERAL VASCULAR DISEASE    AAA - 1994 with reimplant of renals   . Peripheral vascular disease ([place])    subclavian stenosis PTA - 3/08   . Pure hypercholesterolemia   . Renal artery stenosis ([facility])     ALLERGIES:  is allergic to neurontin [gabapentin]; imitrex [sumatriptan]; and lipitor [atorvastatin].  MEDICATIONS:  Current Outpatient Prescriptions  Medication Sig Dispense Refill  . acetaminophen (TYLENOL) 325 MG tablet Take 325-650 mg by mouth every 6 (six) hours as needed for headache (for pain).     [name] ALPRAZolam (XANAX) 0.25 MG tablet Take 1 tablet (0.25 mg total) by mouth 3 (three) times daily as needed for anxiety. 30 tablet 0  . bumetanide (BUMEX) 1 MG tablet Take 1 tablet (1 mg total) by mouth daily as needed (swelling). 30 tablet 1  . lansoprazole (PREVACID) 15 MG capsule Take 1 capsule (15 mg total) by mouth daily. 30 capsule 5  . levothyroxine (SYNTHROID, LEVOTHROID) 112 MCG tablet Take 1 tablet (112 mcg total) by mouth daily. 30 tablet 5  . linaclotide (LINZESS) 145 MCG CAPS capsule Take 1 capsule (145 mcg total) by mouth daily before breakfast. 30 capsule 1  . metoprolol tartrate (LOPRESSOR) 25 MG tablet  Take 0.5 tablets (12.5 mg total) by mouth 2 (two) times daily. 30 tablet 0  . niacin (SLO-NIACIN) 500 MG tablet Take 3 tablets (1,500 mg total) by mouth at bedtime. 30 tablet 0  . nitroGLYCERIN (NITROSTAT) 0.4 MG SL tablet Place 1 tablet (0.4 mg total) under the tongue every 5 (five) minutes as needed for chest pain. (Patient not taking: Reported on 10/31/2016) 25 tablet 10  . ondansetron (ZOFRAN) 8 MG tablet Take 1 tablet (8 mg total) by mouth every 8 (eight) hours as needed for nausea or vomiting. (Patient not taking: Reported on 10/31/2016)  20 tablet 0  . potassium chloride SA (K-DUR,KLOR-CON) 20 MEQ tablet Take 1 tablet (20 mEq total) by mouth daily. 30 tablet 11  . prochlorperazine (COMPAZINE) 10 MG tablet Take 1 tablet (10 mg total) by mouth every 6 (six) hours as needed for nausea or vomiting. (Patient not taking: Reported on 10/31/2016) 30 tablet 0  . rosuvastatin (CRESTOR) 20 MG tablet Take 1 tablet (20 mg total) by mouth daily. 30 tablet 5  . sucralfate (CARAFATE) 1 GM/10ML suspension Take 10 mLs (1 g total) by mouth 4 (four) times daily -  with meals and at bedtime. 420 mL 1  . tamsulosin (FLOMAX) 0.4 MG CAPS capsule Take 1 capsule (0.4 mg total) by mouth daily after breakfast. 30 capsule 1  . warfarin (COUMADIN) 5 MG tablet 1-1/2 tablet every day except 2 tablets Tuesday Thursday Saturday 90 tablet 1   No current facility-administered medications for this visit.     SURGICAL HISTORY:  Past Surgical History:  Procedure Laterality Date  . ABDOMINAL AORTIC ANEURYSM REPAIR     with reimplantation of renals   . CARDIAC CATHETERIZATION     2008  . CAROTID ENDARTERECTOMY Left Jan. 30, 2015   CEA  . CHOLECYSTECTOMY  2000   Gall Bladder  . COLONOSCOPY W/ BIOPSIES AND POLYPECTOMY     Hx: of  . CORONARY ARTERY BYPASS GRAFT     1995  . CORONARY STENT PLACEMENT     Hx: of  . ENDARTERECTOMY Left 07/19/2013   Procedure: ENDARTERECTOMY CAROTID;  Surgeon: Mal Misty, MD;  Location: [facility];  Service: Vascular;  Laterality: Left;  . GALLBLADDER SURGERY  2004   . JOINT REPLACEMENT     partial knee replacement on left 2002   . KNEE SURGERY    . ORIF PELVIC FRACTURE Left 08/25/2016   Procedure: OPEN REDUCTION INTERNAL FIXATION (ORIF) PELVIC FRACTURE; plate on front, SI screw on the back;  Surgeon: Altamese Camuy, MD;  Location: [facility];  Service: Orthopedics;  Laterality: Left;  . OTHER SURGICAL HISTORY     left subclavian stenosis surgery PTA 08/2006   . OTHER SURGICAL HISTORY     carotid surgery on right 2004   . TOTAL KNEE  ARTHROPLASTY  04/29/2011   Procedure: TOTAL KNEE ARTHROPLASTY;  Surgeon: Gearlean Alf;  [facility];  Service: Orthopedics;  Laterality: Right;  . urinary retention    . VASCULAR SURGERY     AAA  . VASECTOMY  1973  . VIDEO BRONCHOSCOPY WITH ENDOBRONCHIAL ULTRASOUND [name] 09/09/2016   Procedure: VIDEO BRONCHOSCOPY WITH ENDOBRONCHIAL ULTRASOUND;  Surgeon: Grace Isaac, MD;  [facility];  Service: Thoracic;  Laterality: [name];    REVIEW OF SYSTEMS:  A comprehensive review of systems was negative except for: Constitutional: positive for fatigue Respiratory: positive for dyspnea on exertion Genitourinary: positive for decreased stream Musculoskeletal: positive for muscle weakness   PHYSICAL EXAMINATION: General  appearance: alert, cooperative, fatigued and no distress Head: Normocephalic, without obvious abnormality, atraumatic Neck: no adenopathy, no JVD, supple, symmetrical, trachea midline and thyroid not enlarged, symmetric, no tenderness/mass/nodules Lymph nodes: Cervical, supraclavicular, and axillary nodes normal. Resp: clear to auscultation bilaterally Back: symmetric, no curvature. ROM normal. No CVA tenderness. Cardio: regular rate and rhythm, S1, S2 normal, no murmur, click, rub or gallop GI: soft, non-tender; bowel sounds normal; no masses,  no organomegaly Extremities: extremities normal, atraumatic, no cyanosis or edema  ECOG PERFORMANCE STATUS: 1 - Symptomatic but completely ambulatory  Blood pressure 119/60, pulse 72, temperature 98.1 F (36.7 C), temperature source Oral, resp. rate 18, height 5\' 7"  (1.702 m), weight 138 lb 4.8 oz (62.7 kg), SpO2 100 %.  LABORATORY DATA: Lab Results  Component Value Date   WBC 2.7 (L) 11/28/2016   HGB 10.3 (L) 11/28/2016   HCT 29.3 (L) 11/28/2016   MCV 85.4 11/28/2016   PLT 101 (L) 11/28/2016      Chemistry      Component Value Date/Time   NA 133 (L) 11/28/2016 1219   K 3.8 11/28/2016 1219   CL 100 (L) 10/06/2016 0611    CO2 31 (H) 11/28/2016 1219   BUN 12.3 11/28/2016 1219   CREATININE 0.8 11/28/2016 1219      Component Value Date/Time   CALCIUM 9.2 11/28/2016 1219   ALKPHOS 92 11/28/2016 1219   AST 23 11/28/2016 1219   ALT 18 11/28/2016 1219   BILITOT 0.75 11/28/2016 1219       RADIOGRAPHIC STUDIES: No results found.  ASSESSMENT AND PLAN:  This is a very pleasant 76 years old white male with a stage IIIa non-small cell lung cancer, adenocarcinoma with positive PDL 1 expression of 50%. The patient is currently undergoing a course of concurrent chemoradiation with weekly carboplatin and paclitaxel status post 6 cycles and has been tolerating this treatment fairly well except for the overlying aphasia and mild dysphagia. I recommended for the patient to proceed with cycle #7 today as a scheduled. I would see him back for follow-up visit in 5 weeks for evaluation after repeating CT scan of the chest for restaging of his disease. For the odynophagia, he will continue his current treatment with Carafate and hydrocodone. He was advised to call immediately if he has any concerning symptoms in the interval. The patient voices understanding of current disease status and treatment options and is in agreement with the current care plan. All questions were answered. The patient knows to call the clinic with any problems, questions or concerns. We can certainly see the patient much sooner if necessary. I spent 10 minutes counseling the patient face to face. The total time spent in the appointment was 15 minutes. Disclaimer: This note was dictated with voice recognition software. Similar sounding words can inadvertently be transcribed and may not be corrected upon review.

## 2016-11-28 NOTE — Telephone Encounter (Signed)
Scheduled appt per 6/11 los - reminder letter sent in the mail. 5wks lab/CT/follow up

## 2016-11-28 NOTE — Telephone Encounter (Signed)
[facility] calling to report INR. INR today was 1.3. Coumadin was held last week. Consult with Dr. Richardson Landry. Change to 10mg  today and 7.5mg  the rest of week and repeat INR on Friday June 15th. Tammy verbalized understanding.

## 2016-11-28 NOTE — Telephone Encounter (Signed)
ok 

## 2016-11-29 ENCOUNTER — Telehealth: Payer: Self-pay | Admitting: Internal Medicine

## 2016-11-29 ENCOUNTER — Ambulatory Visit
Admission: RE | Admit: 2016-11-29 | Discharge: 2016-11-29 | Disposition: A | Discharge status: Home / Self Care | Payer: Medicare Other | Source: Ambulatory Visit | Attending: Radiation Oncology | Admitting: Radiation Oncology

## 2016-11-29 DIAGNOSIS — E039 Hypothyroidism, unspecified: Secondary | ICD-10-CM | POA: Diagnosis not present

## 2016-11-29 DIAGNOSIS — I2510 Atherosclerotic heart disease of native coronary artery without angina pectoris: Secondary | ICD-10-CM | POA: Diagnosis not present

## 2016-11-29 DIAGNOSIS — I129 Hypertensive chronic kidney disease with stage 1 through stage 4 chronic kidney disease, or unspecified chronic kidney disease: Secondary | ICD-10-CM | POA: Diagnosis not present

## 2016-11-29 DIAGNOSIS — C342 Malignant neoplasm of middle lobe, bronchus or lung: Secondary | ICD-10-CM | POA: Diagnosis not present

## 2016-11-29 DIAGNOSIS — N189 Chronic kidney disease, unspecified: Secondary | ICD-10-CM | POA: Diagnosis not present

## 2016-11-29 DIAGNOSIS — Z510 Encounter for antineoplastic radiation therapy: Secondary | ICD-10-CM | POA: Diagnosis not present

## 2016-11-29 NOTE — Telephone Encounter (Signed)
Scheduled appt per 6/11 los - sent reminder letter in the mail - [facility] to contact patient with ct schedule.

## 2016-11-30 ENCOUNTER — Ambulatory Visit
Admission: RE | Admit: 2016-11-30 | Discharge: 2016-11-30 | Disposition: A | Discharge status: Home / Self Care | Payer: Medicare Other | Source: Ambulatory Visit | Attending: Radiation Oncology | Admitting: Radiation Oncology

## 2016-11-30 DIAGNOSIS — N189 Chronic kidney disease, unspecified: Secondary | ICD-10-CM | POA: Diagnosis not present

## 2016-11-30 DIAGNOSIS — E039 Hypothyroidism, unspecified: Secondary | ICD-10-CM | POA: Diagnosis not present

## 2016-11-30 DIAGNOSIS — I82409 Acute embolism and thrombosis of unspecified deep veins of unspecified lower extremity: Secondary | ICD-10-CM | POA: Diagnosis not present

## 2016-11-30 DIAGNOSIS — I129 Hypertensive chronic kidney disease with stage 1 through stage 4 chronic kidney disease, or unspecified chronic kidney disease: Secondary | ICD-10-CM | POA: Diagnosis not present

## 2016-11-30 DIAGNOSIS — Z510 Encounter for antineoplastic radiation therapy: Secondary | ICD-10-CM | POA: Diagnosis not present

## 2016-11-30 DIAGNOSIS — C342 Malignant neoplasm of middle lobe, bronchus or lung: Secondary | ICD-10-CM

## 2016-11-30 DIAGNOSIS — S329XXD Fracture of unspecified parts of lumbosacral spine and pelvis, subsequent encounter for fracture with routine healing: Secondary | ICD-10-CM | POA: Diagnosis not present

## 2016-11-30 DIAGNOSIS — S32811D Multiple fractures of pelvis with unstable disruption of pelvic ring, subsequent encounter for fracture with routine healing: Secondary | ICD-10-CM | POA: Diagnosis not present

## 2016-11-30 DIAGNOSIS — C8521 Mediastinal (thymic) large B-cell lymphoma, lymph nodes of head, face, and neck: Secondary | ICD-10-CM | POA: Diagnosis not present

## 2016-11-30 DIAGNOSIS — I2510 Atherosclerotic heart disease of native coronary artery without angina pectoris: Secondary | ICD-10-CM | POA: Diagnosis not present

## 2016-11-30 DIAGNOSIS — N390 Urinary tract infection, site not specified: Secondary | ICD-10-CM | POA: Diagnosis not present

## 2016-11-30 NOTE — Progress Notes (Signed)
Department of Radiation Oncology  Phone:  [phone] Fax:        [phone]  Weekly Treatment Note    Name: [name] [name] Date: 11/30/2016 MRN: [id number] DOB: 08-14-1940   Diagnosis:  No diagnosis found.   Current dose: 54 Gy  Current fraction:27   MEDICATIONS: Current Outpatient Prescriptions  Medication Sig Dispense Refill  . acetaminophen (TYLENOL) 325 MG tablet Take 325-650 mg by mouth every 6 (six) hours as needed for headache (for pain).     [name] ALPRAZolam (XANAX) 0.25 MG tablet Take 1 tablet (0.25 mg total) by mouth 3 (three) times daily as needed for anxiety. 30 tablet 0  . bumetanide (BUMEX) 1 MG tablet Take 1 tablet (1 mg total) by mouth daily as needed (swelling). 30 tablet 1  . lansoprazole (PREVACID) 15 MG capsule Take 1 capsule (15 mg total) by mouth daily. 30 capsule 5  . levothyroxine (SYNTHROID, LEVOTHROID) 112 MCG tablet Take 1 tablet (112 mcg total) by mouth daily. 30 tablet 5  . linaclotide (LINZESS) 145 MCG CAPS capsule Take 1 capsule (145 mcg total) by mouth daily before breakfast. 30 capsule 1  . metoprolol tartrate (LOPRESSOR) 25 MG tablet Take 0.5 tablets (12.5 mg total) by mouth 2 (two) times daily. 30 tablet 0  . niacin (SLO-NIACIN) 500 MG tablet Take 3 tablets (1,500 mg total) by mouth at bedtime. 30 tablet 0  . nitroGLYCERIN (NITROSTAT) 0.4 MG SL tablet Place 1 tablet (0.4 mg total) under the tongue every 5 (five) minutes as needed for chest pain. (Patient not taking: Reported on 10/31/2016) 25 tablet 10  . ondansetron (ZOFRAN) 8 MG tablet Take 1 tablet (8 mg total) by mouth every 8 (eight) hours as needed for nausea or vomiting. (Patient not taking: Reported on 10/31/2016) 20 tablet 0  . potassium chloride SA (K-DUR,KLOR-CON) 20 MEQ tablet Take 1 tablet (20 mEq total) by mouth daily. 30 tablet 11  . prochlorperazine (COMPAZINE) 10 MG tablet Take 1 tablet (10 mg total) by mouth every 6 (six) hours as needed for nausea or vomiting. (Patient not  taking: Reported on 10/31/2016) 30 tablet 0  . rosuvastatin (CRESTOR) 20 MG tablet Take 1 tablet (20 mg total) by mouth daily. 30 tablet 5  . sucralfate (CARAFATE) 1 GM/10ML suspension Take 10 mLs (1 g total) by mouth 4 (four) times daily -  with meals and at bedtime. 420 mL 1  . tamsulosin (FLOMAX) 0.4 MG CAPS capsule Take 1 capsule (0.4 mg total) by mouth daily after breakfast. 30 capsule 1  . warfarin (COUMADIN) 5 MG tablet 1-1/2 tablet every day except 2 tablets Tuesday Thursday Saturday 90 tablet 1   No current facility-administered medications for this encounter.      ALLERGIES: Neurontin [gabapentin]; Imitrex [sumatriptan]; and Lipitor [atorvastatin]   LABORATORY DATA:  Lab Results  Component Value Date   WBC 2.7 (L) 11/28/2016   HGB 10.3 (L) 11/28/2016   HCT 29.3 (L) 11/28/2016   MCV 85.4 11/28/2016   PLT 101 (L) 11/28/2016   Lab Results  Component Value Date   NA 133 (L) 11/28/2016   K 3.8 11/28/2016   CL 100 (L) 10/06/2016   CO2 31 (H) 11/28/2016   Lab Results  Component Value Date   ALT 18 11/28/2016   AST 23 11/28/2016   ALKPHOS 92 11/28/2016   BILITOT 0.75 11/28/2016     NARRATIVE: Asa E Hauss was seen today for weekly treatment management. The chart was checked and the patient's films were reviewed.  27/33 treatments to the right middle lung. The patient has occasional difficulty swallowing. He is using carafate. Denies skin changes in the treatment field. Denies dizziness or lightheadedness. He is scheduled to see Dr. Karsten Ro, the urologist, again tomorrow.  PHYSICAL EXAMINATION: vitals were not taken for this visit.      Weight: 143.3 lb Pulse: 65 BP: 113/49 O2: 100% A[place] and oriented x3. In no distress.  ASSESSMENT: The patient is doing satisfactorily with treatment.  PLAN: We will continue with the patient's radiation treatment as planned.   This document serves as a record of services personally performed by Kyung Rudd, MD. It was created on  his behalf by Darcus Austin, a trained medical scribe. The creation of this record is based on the scribe's personal observations and the provider's statements to them. This document has been checked and approved by the attending provider.

## 2016-12-01 ENCOUNTER — Ambulatory Visit: Payer: Medicare Other | Admitting: Physical Medicine & Rehabilitation

## 2016-12-01 ENCOUNTER — Ambulatory Visit
Admission: RE | Admit: 2016-12-01 | Discharge: 2016-12-01 | Disposition: A | Discharge status: Home / Self Care | Payer: Medicare Other | Source: Ambulatory Visit | Attending: Radiation Oncology | Admitting: Radiation Oncology

## 2016-12-01 DIAGNOSIS — R338 Other retention of urine: Secondary | ICD-10-CM | POA: Diagnosis not present

## 2016-12-01 DIAGNOSIS — C342 Malignant neoplasm of middle lobe, bronchus or lung: Secondary | ICD-10-CM | POA: Diagnosis not present

## 2016-12-01 DIAGNOSIS — N189 Chronic kidney disease, unspecified: Secondary | ICD-10-CM | POA: Diagnosis not present

## 2016-12-01 DIAGNOSIS — N312 Flaccid neuropathic bladder, not elsewhere classified: Secondary | ICD-10-CM | POA: Diagnosis not present

## 2016-12-01 DIAGNOSIS — E039 Hypothyroidism, unspecified: Secondary | ICD-10-CM | POA: Diagnosis not present

## 2016-12-01 DIAGNOSIS — I2510 Atherosclerotic heart disease of native coronary artery without angina pectoris: Secondary | ICD-10-CM | POA: Diagnosis not present

## 2016-12-01 DIAGNOSIS — Z510 Encounter for antineoplastic radiation therapy: Secondary | ICD-10-CM | POA: Diagnosis not present

## 2016-12-01 DIAGNOSIS — I129 Hypertensive chronic kidney disease with stage 1 through stage 4 chronic kidney disease, or unspecified chronic kidney disease: Secondary | ICD-10-CM | POA: Diagnosis not present

## 2016-12-02 ENCOUNTER — Ambulatory Visit
Admission: RE | Admit: 2016-12-02 | Discharge: 2016-12-02 | Disposition: A | Discharge status: Home / Self Care | Payer: Medicare Other | Source: Ambulatory Visit | Attending: Radiation Oncology | Admitting: Radiation Oncology

## 2016-12-02 ENCOUNTER — Telehealth: Payer: Self-pay | Admitting: *Deleted

## 2016-12-02 DIAGNOSIS — C8521 Mediastinal (thymic) large B-cell lymphoma, lymph nodes of head, face, and neck: Secondary | ICD-10-CM | POA: Diagnosis not present

## 2016-12-02 DIAGNOSIS — I129 Hypertensive chronic kidney disease with stage 1 through stage 4 chronic kidney disease, or unspecified chronic kidney disease: Secondary | ICD-10-CM | POA: Diagnosis not present

## 2016-12-02 DIAGNOSIS — N189 Chronic kidney disease, unspecified: Secondary | ICD-10-CM | POA: Diagnosis not present

## 2016-12-02 DIAGNOSIS — Z510 Encounter for antineoplastic radiation therapy: Secondary | ICD-10-CM | POA: Diagnosis not present

## 2016-12-02 DIAGNOSIS — I739 Peripheral vascular disease, unspecified: Secondary | ICD-10-CM | POA: Diagnosis not present

## 2016-12-02 DIAGNOSIS — I2510 Atherosclerotic heart disease of native coronary artery without angina pectoris: Secondary | ICD-10-CM | POA: Diagnosis not present

## 2016-12-02 DIAGNOSIS — Z466 Encounter for fitting and adjustment of urinary device: Secondary | ICD-10-CM | POA: Diagnosis not present

## 2016-12-02 DIAGNOSIS — C342 Malignant neoplasm of middle lobe, bronchus or lung: Secondary | ICD-10-CM | POA: Diagnosis not present

## 2016-12-02 DIAGNOSIS — E039 Hypothyroidism, unspecified: Secondary | ICD-10-CM | POA: Diagnosis not present

## 2016-12-02 DIAGNOSIS — I4891 Unspecified atrial fibrillation: Secondary | ICD-10-CM | POA: Diagnosis not present

## 2016-12-02 DIAGNOSIS — S329XXD Fracture of unspecified parts of lumbosacral spine and pelvis, subsequent encounter for fracture with routine healing: Secondary | ICD-10-CM | POA: Diagnosis not present

## 2016-12-02 NOTE — Telephone Encounter (Signed)
[name] [name] from [facility] calling to report INR done today. Result 1.7. Takes 7.5mg  every day except on Mondays takes 10mg . Consult with dr Nicki Reaper. Continue the same and recheck next week on Wednesday, Thursday or Friday. [name] from [facility] verbalized understanding.

## 2016-12-05 ENCOUNTER — Other Ambulatory Visit: Payer: Self-pay | Admitting: Medical Oncology

## 2016-12-05 ENCOUNTER — Other Ambulatory Visit (HOSPITAL_BASED_OUTPATIENT_CLINIC_OR_DEPARTMENT_OTHER): Payer: Medicare Other

## 2016-12-05 ENCOUNTER — Telehealth: Payer: Self-pay | Admitting: *Deleted

## 2016-12-05 ENCOUNTER — Ambulatory Visit (HOSPITAL_BASED_OUTPATIENT_CLINIC_OR_DEPARTMENT_OTHER): Payer: Medicare Other

## 2016-12-05 ENCOUNTER — Ambulatory Visit
Admission: RE | Admit: 2016-12-05 | Discharge: 2016-12-05 | Disposition: A | Discharge status: Home / Self Care | Payer: Medicare Other | Source: Ambulatory Visit | Attending: Radiation Oncology | Admitting: Radiation Oncology

## 2016-12-05 ENCOUNTER — Ambulatory Visit: Payer: Medicare Other | Admitting: Nutrition

## 2016-12-05 VITALS — BP 127/71 | HR 95 | Temp 97.6°F | Resp 18

## 2016-12-05 DIAGNOSIS — C342 Malignant neoplasm of middle lobe, bronchus or lung: Secondary | ICD-10-CM

## 2016-12-05 DIAGNOSIS — C3411 Malignant neoplasm of upper lobe, right bronchus or lung: Secondary | ICD-10-CM

## 2016-12-05 DIAGNOSIS — I2510 Atherosclerotic heart disease of native coronary artery without angina pectoris: Secondary | ICD-10-CM | POA: Diagnosis not present

## 2016-12-05 DIAGNOSIS — Z5111 Encounter for antineoplastic chemotherapy: Secondary | ICD-10-CM

## 2016-12-05 DIAGNOSIS — Z510 Encounter for antineoplastic radiation therapy: Secondary | ICD-10-CM | POA: Diagnosis not present

## 2016-12-05 DIAGNOSIS — E039 Hypothyroidism, unspecified: Secondary | ICD-10-CM | POA: Diagnosis not present

## 2016-12-05 DIAGNOSIS — N189 Chronic kidney disease, unspecified: Secondary | ICD-10-CM | POA: Diagnosis not present

## 2016-12-05 DIAGNOSIS — I129 Hypertensive chronic kidney disease with stage 1 through stage 4 chronic kidney disease, or unspecified chronic kidney disease: Secondary | ICD-10-CM | POA: Diagnosis not present

## 2016-12-05 LAB — COMPREHENSIVE METABOLIC PANEL
ALBUMIN: 3.3 g/dL — AB (ref 3.5–5.0)
ALK PHOS: 96 U/L (ref 40–150)
ALT: 18 U/L (ref 0–55)
AST: 22 U/L (ref 5–34)
Anion Gap: 9 mEq/L (ref 3–11)
BILIRUBIN TOTAL: 0.75 mg/dL (ref 0.20–1.20)
BUN: 9.9 mg/dL (ref 7.0–26.0)
CALCIUM: 9.8 mg/dL (ref 8.4–10.4)
CO2: 28 mEq/L (ref 22–29)
CREATININE: 0.7 mg/dL (ref 0.7–1.3)
Chloride: 96 mEq/L — ABNORMAL LOW (ref 98–109)
EGFR: >90 mL/min/{1.73_m2} (ref >90)
GLUCOSE: 87 mg/dL (ref 70–140)
POTASSIUM: 3.8 meq/L (ref 3.5–5.1)
Sodium: 133 mEq/L — ABNORMAL LOW (ref 136–145)
TOTAL PROTEIN: 6.5 g/dL (ref 6.4–8.3)

## 2016-12-05 LAB — CBC WITH DIFFERENTIAL/PLATELET
BASO%: 0.8 % (ref 0.0–2.0)
BASOS ABS: 0 10*3/uL (ref 0.0–0.1)
EOS%: 0.4 % (ref 0.0–7.0)
Eosinophils Absolute: 0 10*3/uL (ref 0.0–0.5)
HEMATOCRIT: 30.6 % — AB (ref 38.4–49.9)
HEMOGLOBIN: 10.9 g/dL — AB (ref 13.0–17.1)
LYMPH#: 0.5 10*3/uL — AB (ref 0.9–3.3)
LYMPH%: 22.4 % (ref 14.0–49.0)
MCH: 30.5 pg (ref 27.2–33.4)
MCHC: 35.6 g/dL (ref 32.0–36.0)
MCV: 85.7 fL (ref 79.3–98.0)
MONO#: 0.3 10*3/uL (ref 0.1–0.9)
MONO%: 13.1 % (ref 0.0–14.0)
NEUT#: 1.5 10*3/uL (ref 1.5–6.5)
NEUT%: 63.3 % (ref 39.0–75.0)
NRBC: 0 % (ref 0–0)
Platelets: 130 10*3/uL — ABNORMAL LOW (ref 140–400)
RBC: 3.57 10*6/uL — ABNORMAL LOW (ref 4.20–5.82)
RDW: 20.1 % — AB (ref 11.0–14.6)
WBC: 2.4 10*3/uL — ABNORMAL LOW (ref 4.0–10.3)

## 2016-12-05 MED ORDER — FAMOTIDINE IN NACL 20-0.9 MG/50ML-% IV SOLN
INTRAVENOUS | Status: AC
  Filled 2016-12-05: qty 50

## 2016-12-05 MED ORDER — DEXAMETHASONE SODIUM PHOSPHATE 100 MG/10ML IJ SOLN
20.0000 mg | Freq: Once | INTRAMUSCULAR | Status: AC
  Administered 2016-12-05: 20 mg via INTRAVENOUS
  Filled 2016-12-05: qty 2

## 2016-12-05 MED ORDER — SODIUM CHLORIDE 0.9 % IV SOLN
50.0000 mg | Freq: Once | INTRAVENOUS | Status: AC
  Administered 2016-12-05: 50 mg via INTRAVENOUS
  Filled 2016-12-05: qty 1

## 2016-12-05 MED ORDER — PALONOSETRON HCL INJECTION 0.25 MG/5ML
0.2500 mg | Freq: Once | INTRAVENOUS | Status: AC
  Administered 2016-12-05: 0.25 mg via INTRAVENOUS

## 2016-12-05 MED ORDER — SODIUM CHLORIDE 0.9 % IV SOLN
Freq: Once | INTRAVENOUS | Status: AC
  Administered 2016-12-05: 14:00:00 via INTRAVENOUS

## 2016-12-05 MED ORDER — PACLITAXEL CHEMO INJECTION 300 MG/50ML
45.0000 mg/m2 | Freq: Once | INTRAVENOUS | Status: AC
  Administered 2016-12-05: 78 mg via INTRAVENOUS
  Filled 2016-12-05: qty 13

## 2016-12-05 MED ORDER — DIPHENHYDRAMINE HCL 50 MG/ML IJ SOLN
INTRAMUSCULAR | Status: AC
  Filled 2016-12-05: qty 1

## 2016-12-05 MED ORDER — PALONOSETRON HCL INJECTION 0.25 MG/5ML
INTRAVENOUS | Status: AC
  Filled 2016-12-05: qty 5

## 2016-12-05 MED ORDER — SODIUM CHLORIDE 0.9 % IV SOLN
160.2000 mg | Freq: Once | INTRAVENOUS | Status: AC
  Administered 2016-12-05: 160 mg via INTRAVENOUS
  Filled 2016-12-05: qty 16

## 2016-12-05 MED ORDER — FAMOTIDINE IN NACL 20-0.9 MG/50ML-% IV SOLN
20.0000 mg | Freq: Once | INTRAVENOUS | Status: AC
  Administered 2016-12-05: 20 mg via INTRAVENOUS

## 2016-12-05 NOTE — Progress Notes (Signed)
Nutrition follow-up completed with patient and his wife, during infusion for lung cancer. Weight has decreased was documented as 138.3 pounds on June 11. Patient reports he continues to have painful swallowing and Carafate does not help very much. He is drinking boost plus or Carnation breakfast essentials. He typically drinks 2 daily.   He reports this is his last chemotherapy.  Nutrition diagnosis: Unintended weight loss continues.  Intervention: Patient was educated to increase oral nutrition supplements 3 times a day Encouraged high-calorie, high-protein foods as tolerated. Provided recipe fact sheet and encouraged patient to use fortified milk recipe with Carnation breakfast essentials. Provided additional coupons.  Monitoring, evaluation, goals:  Patient will continue to work to increase oral intake to minimize further weight loss.  Next visit: Patient will increase oral intake to minimize further weight loss.  Next visit: To be scheduled as needed.

## 2016-12-05 NOTE — Telephone Encounter (Signed)
Started at [facility] and pt no longer there. [facility] states his insurance has been covering med with a $44 co pay.

## 2016-12-05 NOTE — Telephone Encounter (Signed)
Requesting refill on Linzess. Last prescribed by specialist. Frederick Peers.

## 2016-12-05 NOTE — Telephone Encounter (Signed)
No I dont rx this, needes to be managed by specialist, commonly rejected by insur compaoines and we are left trying to approve it, needs to be rxed by doc who initiated it

## 2016-12-05 NOTE — Patient Instructions (Signed)
[facility] Discharge Instructions for Patients Receiving Chemotherapy  Today you received the following chemotherapy agents Taxol and Carboplatin. To help prevent nausea and vomiting after your treatment, we encourage you to take your nausea medication as directed.  If you develop nausea and vomiting that is not controlled by your nausea medication, call the clinic.   BELOW ARE SYMPTOMS THAT SHOULD BE REPORTED IMMEDIATELY:  *FEVER GREATER THAN 100.5 F  *CHILLS WITH OR WITHOUT FEVER  NAUSEA AND VOMITING THAT IS NOT CONTROLLED WITH YOUR NAUSEA MEDICATION  *UNUSUAL SHORTNESS OF BREATH  *UNUSUAL BRUISING OR BLEEDING  TENDERNESS IN MOUTH AND THROAT WITH OR WITHOUT PRESENCE OF ULCERS  *URINARY PROBLEMS  *BOWEL PROBLEMS  UNUSUAL RASH Items with * indicate a potential emergency and should be followed up as soon as possible.  Feel free to call the clinic you have any questions or concerns. The clinic phone number is [phone].  Please show the CHEMO ALERT CARD at check-in to the Emergency Department and triage nurse.    

## 2016-12-06 ENCOUNTER — Other Ambulatory Visit: Payer: Self-pay | Admitting: *Deleted

## 2016-12-06 ENCOUNTER — Ambulatory Visit
Admission: RE | Admit: 2016-12-06 | Discharge: 2016-12-06 | Disposition: A | Discharge status: Home / Self Care | Payer: Medicare Other | Source: Ambulatory Visit | Attending: Radiation Oncology | Admitting: Radiation Oncology

## 2016-12-06 DIAGNOSIS — I129 Hypertensive chronic kidney disease with stage 1 through stage 4 chronic kidney disease, or unspecified chronic kidney disease: Secondary | ICD-10-CM | POA: Diagnosis not present

## 2016-12-06 DIAGNOSIS — N189 Chronic kidney disease, unspecified: Secondary | ICD-10-CM | POA: Diagnosis not present

## 2016-12-06 DIAGNOSIS — Z510 Encounter for antineoplastic radiation therapy: Secondary | ICD-10-CM | POA: Diagnosis not present

## 2016-12-06 DIAGNOSIS — I2510 Atherosclerotic heart disease of native coronary artery without angina pectoris: Secondary | ICD-10-CM | POA: Diagnosis not present

## 2016-12-06 DIAGNOSIS — E039 Hypothyroidism, unspecified: Secondary | ICD-10-CM | POA: Diagnosis not present

## 2016-12-06 DIAGNOSIS — C342 Malignant neoplasm of middle lobe, bronchus or lung: Secondary | ICD-10-CM | POA: Diagnosis not present

## 2016-12-06 MED ORDER — LINACLOTIDE 145 MCG PO CAPS: 145.0000 ug | ORAL_CAPSULE | Freq: Every day | ORAL | 11 refills | Status: DC

## 2016-12-06 NOTE — Telephone Encounter (Signed)
Refill times one yr

## 2016-12-06 NOTE — Telephone Encounter (Signed)
done

## 2016-12-07 ENCOUNTER — Ambulatory Visit: Payer: Medicare Other

## 2016-12-07 ENCOUNTER — Telehealth: Payer: Self-pay | Admitting: *Deleted

## 2016-12-07 ENCOUNTER — Ambulatory Visit
Admission: RE | Admit: 2016-12-07 | Discharge: 2016-12-07 | Disposition: A | Discharge status: Home / Self Care | Payer: Medicare Other | Source: Ambulatory Visit | Attending: Radiation Oncology | Admitting: Radiation Oncology

## 2016-12-07 DIAGNOSIS — I129 Hypertensive chronic kidney disease with stage 1 through stage 4 chronic kidney disease, or unspecified chronic kidney disease: Secondary | ICD-10-CM | POA: Diagnosis not present

## 2016-12-07 DIAGNOSIS — E039 Hypothyroidism, unspecified: Secondary | ICD-10-CM | POA: Diagnosis not present

## 2016-12-07 DIAGNOSIS — S329XXD Fracture of unspecified parts of lumbosacral spine and pelvis, subsequent encounter for fracture with routine healing: Secondary | ICD-10-CM | POA: Diagnosis not present

## 2016-12-07 DIAGNOSIS — I2510 Atherosclerotic heart disease of native coronary artery without angina pectoris: Secondary | ICD-10-CM | POA: Diagnosis not present

## 2016-12-07 DIAGNOSIS — I739 Peripheral vascular disease, unspecified: Secondary | ICD-10-CM | POA: Diagnosis not present

## 2016-12-07 DIAGNOSIS — N189 Chronic kidney disease, unspecified: Secondary | ICD-10-CM | POA: Diagnosis not present

## 2016-12-07 DIAGNOSIS — Z510 Encounter for antineoplastic radiation therapy: Secondary | ICD-10-CM | POA: Diagnosis not present

## 2016-12-07 DIAGNOSIS — C342 Malignant neoplasm of middle lobe, bronchus or lung: Secondary | ICD-10-CM | POA: Diagnosis not present

## 2016-12-07 DIAGNOSIS — I4891 Unspecified atrial fibrillation: Secondary | ICD-10-CM | POA: Diagnosis not present

## 2016-12-07 DIAGNOSIS — C8521 Mediastinal (thymic) large B-cell lymphoma, lymph nodes of head, face, and neck: Secondary | ICD-10-CM | POA: Diagnosis not present

## 2016-12-07 DIAGNOSIS — Z466 Encounter for fitting and adjustment of urinary device: Secondary | ICD-10-CM | POA: Diagnosis not present

## 2016-12-07 NOTE — Telephone Encounter (Signed)
Home health nurse calling to report INR today 3.6. Takes 5mg  tablets. One and a half tablets every day except 2 tabs on Monday. Consult with dr Richardson Landry. Changed to skip dose today, then take one and a half tablets on Friday, Saturday, Sunday, and Tuesday and take one tablet on Thursday and Monday. Recheck INR in one week. Home health nurse verbalized understanding.

## 2016-12-08 ENCOUNTER — Encounter: Payer: Self-pay | Admitting: *Deleted

## 2016-12-08 ENCOUNTER — Ambulatory Visit
Admission: RE | Admit: 2016-12-08 | Discharge: 2016-12-08 | Disposition: A | Discharge status: Home / Self Care | Payer: Medicare Other | Source: Ambulatory Visit | Attending: Radiation Oncology | Admitting: Radiation Oncology

## 2016-12-08 ENCOUNTER — Encounter: Payer: Self-pay | Admitting: Radiation Oncology

## 2016-12-08 ENCOUNTER — Telehealth: Payer: Self-pay | Admitting: *Deleted

## 2016-12-08 DIAGNOSIS — I129 Hypertensive chronic kidney disease with stage 1 through stage 4 chronic kidney disease, or unspecified chronic kidney disease: Secondary | ICD-10-CM | POA: Diagnosis not present

## 2016-12-08 DIAGNOSIS — E039 Hypothyroidism, unspecified: Secondary | ICD-10-CM | POA: Diagnosis not present

## 2016-12-08 DIAGNOSIS — I2510 Atherosclerotic heart disease of native coronary artery without angina pectoris: Secondary | ICD-10-CM | POA: Diagnosis not present

## 2016-12-08 DIAGNOSIS — N189 Chronic kidney disease, unspecified: Secondary | ICD-10-CM | POA: Diagnosis not present

## 2016-12-08 DIAGNOSIS — C342 Malignant neoplasm of middle lobe, bronchus or lung: Secondary | ICD-10-CM | POA: Diagnosis not present

## 2016-12-08 DIAGNOSIS — Z510 Encounter for antineoplastic radiation therapy: Secondary | ICD-10-CM | POA: Diagnosis not present

## 2016-12-08 NOTE — Telephone Encounter (Signed)
LMOVM for pt his CT Chest has been authorized, left phone number for pt to call central scheduling in the event he does not hear from scheduling regarding his appt.

## 2016-12-08 NOTE — Progress Notes (Signed)
Oncology Nurse Navigator Documentation  Oncology Nurse Navigator Flowsheets 12/08/2016  Navigator Location [facility]  Navigator Encounter Type Other/Dr. Tammi Klippel contacted me updating me that CT Chest due to be completed in the next 2 weeks has not been scheduled.  I contacted [name] [name] to get authorized. She completed authorization.  [name] [name] called patient but was unable to reach.  She gave [name] [name] phone number to call to schedule the CT scan.   Treatment Phase Post-Tx Follow-up  Barriers/Navigation Needs Coordination of Care  Interventions Coordination of Care  Coordination of Care Other  Acuity Level 2  Time Spent with Patient 30

## 2016-12-08 NOTE — Progress Notes (Signed)
  Radiation Oncology         [phone] ________________________________  Name: [name] [name] MRN: [id number]  Date: 12/08/2016  DOB: 1940/07/16   End of Treatment Note  Diagnosis:   76 y.o. gentleman with  Stage IIIA(T1bN2M0) adenocarcinoma of the right middle lobe of lung, positive PDL1 expression 50%  Indication for treatment:  Curative, Chemo-Radiotherapy       Radiation treatment dates:   10/24/16 - 12/08/16  Site/dose:   The primary right lung tumor and involved mediastinal adenopathy were treated to 66 Gy in 33 fractions of 2 Gy.  Beams/energy:   A five field 3D conformal treatment arrangement was used delivering 6 and 10 MV photons.  Daily image-guidance CT was used to align the treatment with the targeted volume  Narrative: The patient tolerated radiation treatment relatively well. The patient had pain associated with swallowing managed with Carafate and eating soft foods. He developed hyperpigmantion without desquamation of the right upper back. He lost some weight and had an occasional productive cough. He denied hemoptysis. The patient was fatigued.  Plan: The patient has completed radiation treatment. The patient will return to radiation oncology clinic for routine followup in one month. I advised him to call or return sooner if he has any questions or concerns related to his recovery or treatment.  ________________________________  Sheral Apley. Tammi Klippel, M.D.  This document serves as a record of services personally performed by Tyler Pita, MD. It was created on his behalf by Darcus Austin, a trained medical scribe. The creation of this record is based on the scribe's personal observations and the provider's statements to them. This document has been checked and approved by the attending provider.

## 2016-12-14 ENCOUNTER — Other Ambulatory Visit: Payer: Self-pay | Admitting: Family Medicine

## 2016-12-14 ENCOUNTER — Telehealth: Payer: Self-pay | Admitting: Family Medicine

## 2016-12-14 DIAGNOSIS — Z466 Encounter for fitting and adjustment of urinary device: Secondary | ICD-10-CM | POA: Diagnosis not present

## 2016-12-14 DIAGNOSIS — I739 Peripheral vascular disease, unspecified: Secondary | ICD-10-CM | POA: Diagnosis not present

## 2016-12-14 DIAGNOSIS — I2510 Atherosclerotic heart disease of native coronary artery without angina pectoris: Secondary | ICD-10-CM | POA: Diagnosis not present

## 2016-12-14 DIAGNOSIS — S329XXD Fracture of unspecified parts of lumbosacral spine and pelvis, subsequent encounter for fracture with routine healing: Secondary | ICD-10-CM | POA: Diagnosis not present

## 2016-12-14 DIAGNOSIS — I4891 Unspecified atrial fibrillation: Secondary | ICD-10-CM | POA: Diagnosis not present

## 2016-12-14 DIAGNOSIS — C8521 Mediastinal (thymic) large B-cell lymphoma, lymph nodes of head, face, and neck: Secondary | ICD-10-CM | POA: Diagnosis not present

## 2016-12-14 NOTE — Telephone Encounter (Signed)
Home Health called stating that patient's INR is 3.6 today 12/14/16. Discussed with Dr.Steve Luking in real time and was told to tell patient to hold Coumadin today and take 1 whole tablet on sundays, mondays, thursdays and saturdays, and 1 1/2 tablets on Tuesdays and Fridays. INR to be rechecked by home health on 12/20/16

## 2016-12-19 ENCOUNTER — Telehealth: Payer: Self-pay | Admitting: Radiation Oncology

## 2016-12-19 DIAGNOSIS — K209 Esophagitis, unspecified without bleeding: Secondary | ICD-10-CM

## 2016-12-19 MED ORDER — MAGIC MOUTHWASH W/LIDOCAINE: ORAL | 2 refills | Status: DC

## 2016-12-19 NOTE — Telephone Encounter (Signed)
Phoned patient's home. Spoke with patient's wife. She reports the patient continues to have pain associated with swallowing. Explained this RN will phone in [place] with lidocaine. Reviewed directions for use with patient's wife and she verbalized understanding. Also, explained this RN will phone tomorrow afternoon to determine if this new medication is helping. Wife confirms they have an appointment with the neurologist tomorrow morning so a call in the afternoon will work well.   Hung up with patient's wife. Immediately phoned [employer] and spoke with [facility]. Provided her with patient's script for magic mouthwash with lidocaine.

## 2016-12-20 ENCOUNTER — Encounter: Payer: Self-pay | Admitting: Neurology

## 2016-12-20 ENCOUNTER — Ambulatory Visit (INDEPENDENT_AMBULATORY_CARE_PROVIDER_SITE_OTHER): Payer: Medicare Other | Admitting: Neurology

## 2016-12-20 ENCOUNTER — Telehealth: Payer: Self-pay | Admitting: Radiation Oncology

## 2016-12-20 ENCOUNTER — Telehealth: Payer: Self-pay | Admitting: *Deleted

## 2016-12-20 VITALS — BP 102/56 | HR 89 | Ht 67.0 in | Wt 132.0 lb

## 2016-12-20 DIAGNOSIS — I6529 Occlusion and stenosis of unspecified carotid artery: Secondary | ICD-10-CM

## 2016-12-20 DIAGNOSIS — S329XXD Fracture of unspecified parts of lumbosacral spine and pelvis, subsequent encounter for fracture with routine healing: Secondary | ICD-10-CM | POA: Diagnosis not present

## 2016-12-20 DIAGNOSIS — I6340 Cerebral infarction due to embolism of unspecified cerebral artery: Secondary | ICD-10-CM | POA: Diagnosis not present

## 2016-12-20 DIAGNOSIS — I2510 Atherosclerotic heart disease of native coronary artery without angina pectoris: Secondary | ICD-10-CM | POA: Diagnosis not present

## 2016-12-20 DIAGNOSIS — Z466 Encounter for fitting and adjustment of urinary device: Secondary | ICD-10-CM | POA: Diagnosis not present

## 2016-12-20 DIAGNOSIS — C8521 Mediastinal (thymic) large B-cell lymphoma, lymph nodes of head, face, and neck: Secondary | ICD-10-CM | POA: Diagnosis not present

## 2016-12-20 DIAGNOSIS — I739 Peripheral vascular disease, unspecified: Secondary | ICD-10-CM | POA: Diagnosis not present

## 2016-12-20 DIAGNOSIS — I63031 Cerebral infarction due to thrombosis of right carotid artery: Secondary | ICD-10-CM | POA: Diagnosis not present

## 2016-12-20 DIAGNOSIS — R413 Other amnesia: Secondary | ICD-10-CM | POA: Diagnosis not present

## 2016-12-20 DIAGNOSIS — I4891 Unspecified atrial fibrillation: Secondary | ICD-10-CM | POA: Diagnosis not present

## 2016-12-20 NOTE — Patient Instructions (Addendum)
1. Schedule CTA head and neck with contrast 2. Schedule echocardiogram with bubble study 3. Refer to speech therapy for memory loss after stroke 4. Records from Dr. Kellie Simmering will be requested for review 5. Continue all your medications 6. If symptoms change, go to ER immediately 7. Follow-up in 3 months, call for any changes

## 2016-12-20 NOTE — Progress Notes (Signed)
NEUROLOGY CONSULTATION NOTE  [name] [name] MRN: [id number] DOB: 01/09/1941  Referring provider: Worthy Flank, PA-C Primary care provider: Dr. Baltazar Apo  Reason for consult:  Confusion, falls, history of recent infarcts  Thank you for your kind referral of [name] [name] for consultation of the above symptoms. Although his history is well known to you, please allow me to reiterate it for the purpose of our medical record. The patient was accompanied to the clinic by his wife who also provides collateral information. Records and images were personally reviewed where available.  HISTORY OF PRESENT ILLNESS: This is a pleasant 76 year old right-handed man with a history of CAD s/p CABG in 1995, abdominal aortic aneurysm, carotid stenosis s/p bilateral CEA, diverticulosis, atrial fibrillation, hypertension, hiatal hernia, hypothyroidism, osteoporosis, dyslipidemia, renal artery stenosis and long history of smoking but quit in 1994. He was admitted to [facility] last 08/22/2016 after a fall off a 20-feet ladder. He sustained a pelvic fracture and transverse process fractrues L2-S1 and underwent surgery. As part of his evaluation, he had a CT chest/abdomen/pelvis which showed pulmonary densities. Bronchoscopy was done with biopsy showing malignant cells consistent with adenocarcinoma. He developed a DVT and was started on Coumadin. His head CT without contrast showed abnormal anterior left frontal lobe with a suspicious for 2 cm brain mass with mild surrounding vasogenic edema. Follow-up brain MRI done 09/02/2016 showed a left frontal lesion with T1 shortening and susceptibility blooming without enhancement, favoring subacute to chronic hemorrhagic infarct or cortical contusion. There were small foci of restricted diffusion in the right frontal and parietal lobes in a watershed distribution consistent with acute/early subacute infarcts. Sequelae of trauma with shear injury was also possible, but no  hemorrhage was seen. There was a small left cerebral convexity subdural hematoma without mass effect. A repeat MRI brain with and without contrast done 10/15/16 showed new areas of acute ischemia in the right hemisphere not seen on previous scan, consistent with ongoing hypoperfusion and multifocal watershed infarcts. The changes on the left frontal region did not show features of metastatic disease, more consistent with evolution of hemorrhagic infarct. There was concern for proximal high-grade stenosis, consider CTA head and neck for further evaluation.  Since his hospital stay, he has finished chemotherapy and radiation therapy. He developed urinary retention and has a foley catheter with follow-up with Urology. He has not seen Neurology until today, and his wife reports confusion where he would not remember things he had always done, asking a lot of questions because he could not understand what she was trying to tell him. He could not figure out their remote control of 10 years, he states he has trouble seeing the little numbers. He had previously been in charge of bills, his wife took after after hospitalization. She manages his medications, he has always been on multiple medications in the past, but is having a hard time managing them now. He had been driving prior to the fall without getting lost, but is not driving at this time. His wife helps him to the shower but he can bathe and dress himself. She has noticed more irritability, no paranoia. He was having "crazy things and imagining things" on gabapentin, resolved with discontinuation of medication. His mother and 3 sisters had Alzheimer's disease. His mother had a history of stroke.  He denies any headaches, dizziness, diplopia, dysarthria, neck/back pain, focal numbness/tingling/weakness, bowel dysfunction. He has had some swallowing difficulties after radiation. Sometimes his right leg twitches when he lifts it  up while sitting on a recliner, it  stops when he puts his leg down.   PAST MEDICAL HISTORY: Past Medical History:  Diagnosis Date  . Adenocarcinoma of right lung, stage 3 ([place]) 09/20/2016  . Atrial fibrillation ([place])    no hx of reported at preop visit of 04/21/11   . CAD (coronary artery disease)   . Carotid artery occlusion    left carotid endarterectomy  . Chronic kidney disease    AAA repair with reimplant of renals   . CHRONIC OBSTRUCTIVE PULMONARY DISEASE    pt denied at visit of 04/21/11   . CORONARY ARTERY DISEASE   . Dehydration 11/15/2016  . Disorder of left sacroiliac joint 08/24/2016  . DIVERTICULOSIS OF COLON   . Encounter for antineoplastic chemotherapy 10/17/2016  . GERD   . H/O hiatal hernia   . Headache(784.0)    Hx: of years of years  . HYPERTENSION   . HYPOTHYROIDISM   . JOINT EFFUSION, KNEE   . KNEE, ARTHRITIS, DEGEN./OSTEO    right knee   . Myocardial infarction ([place])    1994   . NEPHROLITHIASIS, HX OF   . OSTEOPOROSIS   . Other dysphagia   . PERIPHERAL VASCULAR DISEASE    AAA - 1994 with reimplant of renals   . Peripheral vascular disease ([place])    subclavian stenosis PTA - 3/08   . Pure hypercholesterolemia   . Renal artery stenosis ([place])     PAST SURGICAL HISTORY: Past Surgical History:  Procedure Laterality Date  . ABDOMINAL AORTIC ANEURYSM REPAIR     with reimplantation of renals   . CARDIAC CATHETERIZATION     2008  . CAROTID ENDARTERECTOMY Left Jan. 30, 2015   CEA  . CHOLECYSTECTOMY  2000   Gall Bladder  . COLONOSCOPY W/ BIOPSIES AND POLYPECTOMY     Hx: of  . CORONARY ARTERY BYPASS GRAFT     1995  . CORONARY STENT PLACEMENT     Hx: of  . ENDARTERECTOMY Left 07/19/2013   Procedure: ENDARTERECTOMY CAROTID;  Surgeon: Mal Misty, MD;  Location: [place];  Service: Vascular;  Laterality: Left;  . GALLBLADDER SURGERY  2004   . JOINT REPLACEMENT     partial knee replacement on left 2002   . KNEE SURGERY    . ORIF PELVIC FRACTURE Left 08/25/2016   Procedure: OPEN REDUCTION  INTERNAL FIXATION (ORIF) PELVIC FRACTURE; plate on front, SI screw on the back;  Surgeon: Altamese Staples, MD;  Location: [place];  Service: Orthopedics;  Laterality: Left;  . OTHER SURGICAL HISTORY     left subclavian stenosis surgery PTA 08/2006   . OTHER SURGICAL HISTORY     carotid surgery on right 2004   . TOTAL KNEE ARTHROPLASTY  04/29/2011   Procedure: TOTAL KNEE ARTHROPLASTY;  Surgeon: Gearlean Alf;  Location: [facility];  Service: Orthopedics;  Laterality: Right;  . urinary retention    . VASCULAR SURGERY     AAA  . VASECTOMY  1973  . VIDEO BRONCHOSCOPY WITH ENDOBRONCHIAL ULTRASOUND [name] 09/09/2016   Procedure: VIDEO BRONCHOSCOPY WITH ENDOBRONCHIAL ULTRASOUND;  Surgeon: Grace Isaac, MD;  [facility];  Service: Thoracic;  Laterality: [name];    MEDICATIONS: Current Outpatient Prescriptions on File Prior to Visit  Medication Sig Dispense Refill  . acetaminophen (TYLENOL) 325 MG tablet Take 325-650 mg by mouth every 6 (six) hours as needed for headache (for pain).     [name] ALPRAZolam (XANAX) 0.25 MG tablet Take 1 tablet (0.25  mg total) by mouth 3 (three) times daily as needed for anxiety. 30 tablet 0  . bumetanide (BUMEX) 1 MG tablet Take 1 tablet (1 mg total) by mouth daily as needed (swelling). 30 tablet 1  . lansoprazole (PREVACID) 15 MG capsule Take 1 capsule (15 mg total) by mouth daily. 30 capsule 5  . levothyroxine (SYNTHROID, LEVOTHROID) 112 MCG tablet Take 1 tablet (112 mcg total) by mouth daily. 30 tablet 5  . linaclotide (LINZESS) 145 MCG CAPS capsule Take 1 capsule (145 mcg total) by mouth daily before breakfast. 30 capsule 11  . magic mouthwash w/lidocaine SOLN Composition: 80 cc each of Extra St. Maalox Plus, Diphenhydramine, and Nystatin. Plus 240 cc of 2% viscous lidocaine to make 480 cc. Swish and swallow 2 tsp four times per day. 480 mL 2  . metoprolol tartrate (LOPRESSOR) 25 MG tablet TAKE ONE-HALF TABLET BY MOUTH TWO TIMES DAILY. 30 tablet 5  . niacin (SLO-NIACIN) 500  MG tablet Take 3 tablets (1,500 mg total) by mouth at bedtime. 30 tablet 0  . nitroGLYCERIN (NITROSTAT) 0.4 MG SL tablet Place 1 tablet (0.4 mg total) under the tongue every 5 (five) minutes as needed for chest pain. (Patient not taking: Reported on 10/31/2016) 25 tablet 10  . ondansetron (ZOFRAN) 8 MG tablet Take 1 tablet (8 mg total) by mouth every 8 (eight) hours as needed for nausea or vomiting. (Patient not taking: Reported on 10/31/2016) 20 tablet 0  . potassium chloride SA (K-DUR,KLOR-CON) 20 MEQ tablet Take 1 tablet (20 mEq total) by mouth daily. 30 tablet 11  . prochlorperazine (COMPAZINE) 10 MG tablet Take 1 tablet (10 mg total) by mouth every 6 (six) hours as needed for nausea or vomiting. (Patient not taking: Reported on 10/31/2016) 30 tablet 0  . rosuvastatin (CRESTOR) 20 MG tablet Take 1 tablet (20 mg total) by mouth daily. 30 tablet 5  . sucralfate (CARAFATE) 1 GM/10ML suspension Take 10 mLs (1 g total) by mouth 4 (four) times daily -  with meals and at bedtime. 420 mL 1  . tamsulosin (FLOMAX) 0.4 MG CAPS capsule Take 1 capsule (0.4 mg total) by mouth daily after breakfast. 30 capsule 1  . warfarin (COUMADIN) 5 MG tablet 1-1/2 tablet every day except 2 tablets Tuesday Thursday Saturday 90 tablet 1   No current facility-administered medications on file prior to visit.     ALLERGIES: Allergies  Allergen Reactions  . Neurontin [Gabapentin] Other (See Comments)    Caused confusion  . Imitrex [Sumatriptan] Nausea And Vomiting and Other (See Comments)    "made me like I was having a stoke"  . Lipitor [Atorvastatin] Nausea And Vomiting and Other (See Comments)    INTOLERANCE > MYALGIAS "couldn't get out of the bed ( pt had to crawl out of bed ) I hurt so bad"    FAMILY HISTORY: Family History  Problem Relation Age of Onset  . Hypertension Mother   . Heart disease Father   . Heart attack Father   . Diabetes Sister   . Cancer Brother 5       Lung  . Diabetes Sister     SOCIAL  HISTORY: Social History   Social History  . Marital status: Married    Spouse name: [name]  . Number of children: [name]  . Years of education: [name]   Occupational History  . Not on file.   Social History Main Topics  . Smoking status: Former Smoker    Types: Cigarettes    Quit  date: 06/20/1992  . Smokeless tobacco: Never Used  . Alcohol use No  . Drug use: No  . Sexual activity: Not on file   Other Topics Concern  . Not on file   Social History Narrative  . No narrative on file    REVIEW OF SYSTEMS: Constitutional: No fevers, chills, or sweats, no generalized fatigue, change in appetite Eyes: No visual changes, double vision, eye pain Ear, nose and throat: No hearing loss, ear pain, nasal congestion, sore throat Cardiovascular: No chest pain, palpitations Respiratory:  No shortness of breath at rest or with exertion, wheezes GastrointestinaI: No nausea, vomiting, diarrhea, abdominal pain, fecal incontinence Genitourinary:  No dysuria, urinary retention or frequency Musculoskeletal:  No neck pain, back pain Integumentary: No rash, pruritus, skin lesions Neurological: as above Psychiatric: No depression, insomnia, anxiety Endocrine: No palpitations, fatigue, diaphoresis, mood swings, change in appetite, change in weight, increased thirst Hematologic/Lymphatic:  No anemia, purpura, petechiae. Allergic/Immunologic: no itchy/runny eyes, nasal congestion, recent allergic reactions, rashes  PHYSICAL EXAM: Vitals:   12/20/16 1019  BP: (!) 102/56  Pulse: 89   General: No acute distress Head:  Normocephalic/atraumatic Eyes: Fundoscopic exam shows bilateral sharp discs, no vessel changes, exudates, or hemorrhages Neck: supple, no paraspinal tenderness, full range of motion Back: No paraspinal tenderness Heart: regular rate and rhythm Lungs: Clear to auscultation bilaterally. Vascular: +right carotid bruit Skin/Extremities: No rash, no edema Neurological Exam: Mental status:  alert and oriented to person, place, and time, no dysarthria or aphasia, Fund of knowledge is appropriate.  Recent and remote memory are intact.  Attention and concentration are normal.    Able to name objects and repeat phrases.  MMSE - Mini Mental State Exam 12/20/2016  Orientation to time 1  Orientation to Place 5  Registration 3  Attention/ Calculation 4  Recall 0  Language- name 2 objects 2  Language- repeat 1  Language- follow 3 step command 3  Language- read & follow direction 1  Write a sentence 0  Copy design 0  Total score 20   Cranial nerves: CN I: not tested CN II: pupils equal, round and reactive to light, visual fields intact, fundi unremarkable. CN III, IV, VI:  full range of motion, no nystagmus, no ptosis CN V: facial sensation intact CN VII: upper and lower face symmetric CN VIII: hearing intact to finger rub CN IX, X: gag intact, uvula midline CN XI: sternocleidomastoid and trapezius muscles intact CN XII: tongue midline Bulk & Tone: normal, no fasciculations. Motor: 5/5 throughout with no pronator drift. Sensation: intact to light touch, cold, pin, vibration and joint position sense.  No extinction to double simultaneous stimulation.  Romberg test negative Deep Tendon Reflexes: +1 throughout, no ankle clonus Plantar responses: mute bilaterally Cerebellar: no incoordination on finger to nose, heel to shin. No dysdiadochokinesia Gait: slow and cautious with walker, reporting pain in his pelvis Tremor: none  IMPRESSION: This is a pleasant 76 year old right-handed man with a multiple medical issues including CAD s/p CABG in 1995, abdominal aortic aneurysm, carotid stenosis s/p bilateral CEA, diverticulosis, atrial fibrillation, hypertension, hiatal hernia, hypothyroidism, osteoporosis, dyslipidemia, renal artery stenosis, who fell off a 20-foot ladder last March 2018 sustaining pelvic and transverse process fractures. As part of his evaluation, he had a CT C/A/P which  showed pulmonary densities with biopsy consistent with adenocarcinoma. He underwent chemotherapy and radiation. During his hospitalization, his brain imaging showed bilateral strokes, with evolving left frontal subacute to chronic hemorrhagic infarct or cortical contusion, and watershed  infarcts in the right hemisphere concerning for proximal high grade carotid stenosis. He has a history of bilateral carotid stenosis s/p CEA, last carotid dopplers in the system from 2015, records from Dr. Kellie Simmering will be requested for review. He is now on Coumadin for DVT, he takes rosuvastatin with last LDL in October 2017 42, HbA1c from 09/2016 was 5.4. The malignancy may have caused a hypercoagulable state leading to the bilateral strokes, we will do a CTA head and neck as recommended, and echocardiogram as part of stroke workup. His neurological exam is non-focal, MMSE today 20/30 indicating mild dementia, likely vascular. We discussed doing speech therapy for cognitive changes after stroke, consideration for starting Aricept will be done on his next follow-up. They were instructed to go to the ER immediately for any change in symptoms. He will follow-up in 3 months and knows to call for any changes.   Thank you for allowing me to participate in the care of this patient. Please do not hesitate to call for any questions or concerns.   Ellouise Newer, M.D.  CC: Dr. Wolfgang Phoenix, Dr. Tammi Klippel, Worthy Flank, PA-C

## 2016-12-20 NOTE — Telephone Encounter (Signed)
Phoned patient's home to inquire if magic mouthwash with lidocaine was helping to relieve esophagitis. No answer at home and no option to leave a message. Phoned patient's wife's cell phone. No answer. Left message requesting return call.

## 2016-12-20 NOTE — Telephone Encounter (Signed)
[name] [name] at home health called to report INR of 3.4. Patient currently taking Coumadin 5 mg 1.5 tablets on Tuesday and Friday and one tablet all other days-consult with Dr Marlyce Huge to change to Coumadin 5 mg 1.5 tablet on Friday and one tablet all other days-recheck INR in one week. [name] [name] at home health verbalized understanding and will discuss changes with patient and recheck INR in one week.

## 2016-12-22 DIAGNOSIS — C8521 Mediastinal (thymic) large B-cell lymphoma, lymph nodes of head, face, and neck: Secondary | ICD-10-CM | POA: Diagnosis not present

## 2016-12-22 DIAGNOSIS — I4891 Unspecified atrial fibrillation: Secondary | ICD-10-CM | POA: Diagnosis not present

## 2016-12-22 DIAGNOSIS — Z466 Encounter for fitting and adjustment of urinary device: Secondary | ICD-10-CM | POA: Diagnosis not present

## 2016-12-22 DIAGNOSIS — S329XXD Fracture of unspecified parts of lumbosacral spine and pelvis, subsequent encounter for fracture with routine healing: Secondary | ICD-10-CM | POA: Diagnosis not present

## 2016-12-22 DIAGNOSIS — I2510 Atherosclerotic heart disease of native coronary artery without angina pectoris: Secondary | ICD-10-CM | POA: Diagnosis not present

## 2016-12-22 DIAGNOSIS — I739 Peripheral vascular disease, unspecified: Secondary | ICD-10-CM | POA: Diagnosis not present

## 2016-12-26 DIAGNOSIS — C8521 Mediastinal (thymic) large B-cell lymphoma, lymph nodes of head, face, and neck: Secondary | ICD-10-CM | POA: Diagnosis not present

## 2016-12-26 DIAGNOSIS — I4891 Unspecified atrial fibrillation: Secondary | ICD-10-CM | POA: Diagnosis not present

## 2016-12-26 DIAGNOSIS — Z466 Encounter for fitting and adjustment of urinary device: Secondary | ICD-10-CM | POA: Diagnosis not present

## 2016-12-26 DIAGNOSIS — S329XXD Fracture of unspecified parts of lumbosacral spine and pelvis, subsequent encounter for fracture with routine healing: Secondary | ICD-10-CM | POA: Diagnosis not present

## 2016-12-26 DIAGNOSIS — I739 Peripheral vascular disease, unspecified: Secondary | ICD-10-CM | POA: Diagnosis not present

## 2016-12-26 DIAGNOSIS — I2510 Atherosclerotic heart disease of native coronary artery without angina pectoris: Secondary | ICD-10-CM | POA: Diagnosis not present

## 2016-12-27 ENCOUNTER — Ambulatory Visit (INDEPENDENT_AMBULATORY_CARE_PROVIDER_SITE_OTHER): Payer: Medicare Other | Admitting: Family Medicine

## 2016-12-27 ENCOUNTER — Encounter: Payer: Self-pay | Admitting: Family Medicine

## 2016-12-27 VITALS — BP 128/74 | Ht 67.0 in | Wt 134.0 lb

## 2016-12-27 DIAGNOSIS — I10 Essential (primary) hypertension: Secondary | ICD-10-CM

## 2016-12-27 DIAGNOSIS — R5383 Other fatigue: Secondary | ICD-10-CM

## 2016-12-27 DIAGNOSIS — E039 Hypothyroidism, unspecified: Secondary | ICD-10-CM

## 2016-12-27 DIAGNOSIS — C342 Malignant neoplasm of middle lobe, bronchus or lung: Secondary | ICD-10-CM

## 2016-12-27 DIAGNOSIS — I82433 Acute embolism and thrombosis of popliteal vein, bilateral: Secondary | ICD-10-CM

## 2016-12-27 DIAGNOSIS — I6529 Occlusion and stenosis of unspecified carotid artery: Secondary | ICD-10-CM

## 2016-12-27 NOTE — Progress Notes (Signed)
   Subjective:    Patient ID: [name] [name], male    DOB: [birth year], 76 y.o.   MRN: [id number]  Hypertension  This is a chronic problem. The current episode started more than 1 year ago.   Pt states home health nurse comes out tomorrow to do INR. Last one 7/3. Result 3.4. Repeat in one week. Claims compliance.  Blood pressure medicine and blood pressure levels reviewed today with patient. Compliant with blood pressure medicine. States does not miss a dose. No obvious side effects. Blood pressure generally good when checked elsewhere. Watching salt intake.   Still experiencing some considerable weakness.  Has radiation esophagitis causing some difficulty, now somewhat improved  Using some boost and choc seems to iritate trying to build up his nutrition    Review of Systems No headache, no major weight loss or weight gain, no chest pain no back pain abdominal pain no change in bowel habits complete ROS otherwise negative     Objective:   Physical Exam  Thin no acute distress blood pressure good on repeat lungs no crackles heart rare rhythm ankles without edema      Assessment & Plan:  Impression 1 hypertension good control discussed maintain same #2 current treatment for lung cancer miss a treatment #3 chronic anticoagulation discussed #4 nutritional concerns discussed. Medications refilled. Diet encouraged exercise encourage/follow-up in a couple months.

## 2016-12-28 ENCOUNTER — Telehealth: Payer: Self-pay | Admitting: *Deleted

## 2016-12-28 ENCOUNTER — Encounter: Payer: Self-pay | Admitting: Family Medicine

## 2016-12-28 DIAGNOSIS — S329XXD Fracture of unspecified parts of lumbosacral spine and pelvis, subsequent encounter for fracture with routine healing: Secondary | ICD-10-CM | POA: Diagnosis not present

## 2016-12-28 DIAGNOSIS — I2510 Atherosclerotic heart disease of native coronary artery without angina pectoris: Secondary | ICD-10-CM | POA: Diagnosis not present

## 2016-12-28 DIAGNOSIS — I4891 Unspecified atrial fibrillation: Secondary | ICD-10-CM | POA: Diagnosis not present

## 2016-12-28 DIAGNOSIS — C8521 Mediastinal (thymic) large B-cell lymphoma, lymph nodes of head, face, and neck: Secondary | ICD-10-CM | POA: Diagnosis not present

## 2016-12-28 DIAGNOSIS — Z466 Encounter for fitting and adjustment of urinary device: Secondary | ICD-10-CM | POA: Diagnosis not present

## 2016-12-28 DIAGNOSIS — I739 Peripheral vascular disease, unspecified: Secondary | ICD-10-CM | POA: Diagnosis not present

## 2016-12-28 DIAGNOSIS — N390 Urinary tract infection, site not specified: Secondary | ICD-10-CM | POA: Diagnosis not present

## 2016-12-28 NOTE — Telephone Encounter (Signed)
Maria from [facility] calling to report pt's INR. Result today is 2.2. Takes coumadin 5mg  tablets one every day except on fridays takes one and a half tablets. Also having some confusion and his cath was leaking. She changed cath and his urine was cloudy. States confusion better today. Would like order for U/A and culture. Consult with dr Richardson Landry. Continue the same on coumadin and recheck INR in 2 weeks. Ok to do U/A and culture. Discussed with Verdis Frederickson. Verdis Frederickson verbalized understanding.

## 2016-12-30 ENCOUNTER — Other Ambulatory Visit (HOSPITAL_BASED_OUTPATIENT_CLINIC_OR_DEPARTMENT_OTHER): Payer: Medicare Other

## 2016-12-30 ENCOUNTER — Encounter (HOSPITAL_COMMUNITY): Payer: Self-pay

## 2016-12-30 ENCOUNTER — Ambulatory Visit (HOSPITAL_COMMUNITY)
Admission: RE | Admit: 2016-12-30 | Discharge: 2016-12-30 | Disposition: A | Discharge status: Home / Self Care | Payer: Medicare Other | Source: Ambulatory Visit | Attending: Internal Medicine | Admitting: Internal Medicine

## 2016-12-30 DIAGNOSIS — Z9221 Personal history of antineoplastic chemotherapy: Secondary | ICD-10-CM | POA: Insufficient documentation

## 2016-12-30 DIAGNOSIS — I6523 Occlusion and stenosis of bilateral carotid arteries: Secondary | ICD-10-CM | POA: Insufficient documentation

## 2016-12-30 DIAGNOSIS — C342 Malignant neoplasm of middle lobe, bronchus or lung: Secondary | ICD-10-CM | POA: Diagnosis not present

## 2016-12-30 DIAGNOSIS — I639 Cerebral infarction, unspecified: Secondary | ICD-10-CM | POA: Diagnosis not present

## 2016-12-30 DIAGNOSIS — I700 Atherosclerosis of aorta: Secondary | ICD-10-CM | POA: Diagnosis not present

## 2016-12-30 DIAGNOSIS — Z5111 Encounter for antineoplastic chemotherapy: Secondary | ICD-10-CM

## 2016-12-30 DIAGNOSIS — C3491 Malignant neoplasm of unspecified part of right bronchus or lung: Secondary | ICD-10-CM | POA: Diagnosis not present

## 2016-12-30 DIAGNOSIS — I6340 Cerebral infarction due to embolism of unspecified cerebral artery: Secondary | ICD-10-CM

## 2016-12-30 DIAGNOSIS — M4854XA Collapsed vertebra, not elsewhere classified, thoracic region, initial encounter for fracture: Secondary | ICD-10-CM | POA: Diagnosis not present

## 2016-12-30 DIAGNOSIS — C3411 Malignant neoplasm of upper lobe, right bronchus or lung: Secondary | ICD-10-CM

## 2016-12-30 DIAGNOSIS — J439 Emphysema, unspecified: Secondary | ICD-10-CM | POA: Insufficient documentation

## 2016-12-30 DIAGNOSIS — R413 Other amnesia: Secondary | ICD-10-CM

## 2016-12-30 DIAGNOSIS — I63031 Cerebral infarction due to thrombosis of right carotid artery: Secondary | ICD-10-CM

## 2016-12-30 LAB — CBC WITH DIFFERENTIAL/PLATELET
BASO%: 0.7 % (ref 0.0–2.0)
Basophils Absolute: 0 10*3/uL (ref 0.0–0.1)
EOS ABS: 0 10*3/uL (ref 0.0–0.5)
EOS%: 1 % (ref 0.0–7.0)
HEMATOCRIT: 33.2 % — AB (ref 38.4–49.9)
HGB: 11.8 g/dL — ABNORMAL LOW (ref 13.0–17.1)
LYMPH#: 0.5 10*3/uL — AB (ref 0.9–3.3)
LYMPH%: 10.7 % — ABNORMAL LOW (ref 14.0–49.0)
MCH: 32.2 pg (ref 27.2–33.4)
MCHC: 35.5 g/dL (ref 32.0–36.0)
MCV: 90.7 fL (ref 79.3–98.0)
MONO#: 0.3 10*3/uL (ref 0.1–0.9)
MONO%: 6.4 % (ref 0.0–14.0)
NEUT%: 81.2 % — ABNORMAL HIGH (ref 39.0–75.0)
NEUTROS ABS: 3.8 10*3/uL (ref 1.5–6.5)
Platelets: 111 10*3/uL — ABNORMAL LOW (ref 140–400)
RBC: 3.66 10*6/uL — ABNORMAL LOW (ref 4.20–5.82)
RDW: 21.9 % — ABNORMAL HIGH (ref 11.0–14.6)
WBC: 4.7 10*3/uL (ref 4.0–10.3)

## 2016-12-30 LAB — COMPREHENSIVE METABOLIC PANEL
ALT: 17 U/L (ref 0–55)
AST: 23 U/L (ref 5–34)
Albumin: 3.3 g/dL — ABNORMAL LOW (ref 3.5–5.0)
Alkaline Phosphatase: 105 U/L (ref 40–150)
Anion Gap: 9 mEq/L (ref 3–11)
BILIRUBIN TOTAL: 0.56 mg/dL (ref 0.20–1.20)
BUN: 13.7 mg/dL (ref 7.0–26.0)
CHLORIDE: 98 meq/L (ref 98–109)
CO2: 28 meq/L (ref 22–29)
CREATININE: 0.8 mg/dL (ref 0.7–1.3)
Calcium: 9.9 mg/dL (ref 8.4–10.4)
EGFR: 88 mL/min/{1.73_m2} — AB (ref >90)
GLUCOSE: 140 mg/dL (ref 70–140)
Potassium: 4.1 mEq/L (ref 3.5–5.1)
SODIUM: 134 meq/L — AB (ref 136–145)
TOTAL PROTEIN: 6.6 g/dL (ref 6.4–8.3)

## 2016-12-30 MED ORDER — IOPAMIDOL (ISOVUE-300) INJECTION 61%
INTRAVENOUS | Status: AC
  Filled 2016-12-30: qty 75

## 2016-12-30 MED ORDER — IOPAMIDOL (ISOVUE-370) INJECTION 76%
INTRAVENOUS | Status: AC
  Filled 2016-12-30: qty 100

## 2016-12-30 MED ORDER — IOPAMIDOL (ISOVUE-370) INJECTION 76%
100.0000 mL | Freq: Once | INTRAVENOUS | Status: AC | PRN
  Administered 2016-12-30: 100 mL via INTRAVENOUS

## 2016-12-30 MED ORDER — IOPAMIDOL (ISOVUE-300) INJECTION 61%
50.0000 mL | Freq: Once | INTRAVENOUS | Status: AC | PRN
  Administered 2016-12-30: 50 mL via INTRAVENOUS

## 2016-12-31 ENCOUNTER — Encounter (HOSPITAL_COMMUNITY): Payer: Self-pay | Admitting: Emergency Medicine

## 2016-12-31 ENCOUNTER — Emergency Department (HOSPITAL_COMMUNITY)
Admission: EM | Admit: 2016-12-31 | Discharge: 2016-12-31 | Disposition: A | Discharge status: Home / Self Care | Payer: Medicare Other | Attending: Emergency Medicine | Admitting: Emergency Medicine

## 2016-12-31 DIAGNOSIS — T839XXA Unspecified complication of genitourinary prosthetic device, implant and graft, initial encounter: Secondary | ICD-10-CM | POA: Insufficient documentation

## 2016-12-31 DIAGNOSIS — Y732 Prosthetic and other implants, materials and accessory gastroenterology and urology devices associated with adverse incidents: Secondary | ICD-10-CM | POA: Diagnosis not present

## 2016-12-31 DIAGNOSIS — E039 Hypothyroidism, unspecified: Secondary | ICD-10-CM | POA: Diagnosis not present

## 2016-12-31 DIAGNOSIS — I129 Hypertensive chronic kidney disease with stage 1 through stage 4 chronic kidney disease, or unspecified chronic kidney disease: Secondary | ICD-10-CM | POA: Insufficient documentation

## 2016-12-31 DIAGNOSIS — Z87891 Personal history of nicotine dependence: Secondary | ICD-10-CM | POA: Diagnosis not present

## 2016-12-31 DIAGNOSIS — Z79899 Other long term (current) drug therapy: Secondary | ICD-10-CM | POA: Insufficient documentation

## 2016-12-31 DIAGNOSIS — R300 Dysuria: Secondary | ICD-10-CM | POA: Diagnosis not present

## 2016-12-31 DIAGNOSIS — Z7901 Long term (current) use of anticoagulants: Secondary | ICD-10-CM | POA: Insufficient documentation

## 2016-12-31 DIAGNOSIS — R339 Retention of urine, unspecified: Secondary | ICD-10-CM | POA: Diagnosis present

## 2016-12-31 DIAGNOSIS — N390 Urinary tract infection, site not specified: Secondary | ICD-10-CM | POA: Insufficient documentation

## 2016-12-31 DIAGNOSIS — T83098A Other mechanical complication of other indwelling urethral catheter, initial encounter: Secondary | ICD-10-CM | POA: Diagnosis not present

## 2016-12-31 DIAGNOSIS — N189 Chronic kidney disease, unspecified: Secondary | ICD-10-CM | POA: Diagnosis not present

## 2016-12-31 LAB — URINALYSIS, ROUTINE W REFLEX MICROSCOPIC
BILIRUBIN URINE: NEGATIVE
Glucose, UA: NEGATIVE mg/dL
HGB URINE DIPSTICK: NEGATIVE
Ketones, ur: NEGATIVE mg/dL
NITRITE: NEGATIVE
PH: 7 (ref 5.0–8.0)
Protein, ur: NEGATIVE mg/dL
SPECIFIC GRAVITY, URINE: 1.003 — AB (ref 1.005–1.030)

## 2016-12-31 MED ORDER — CEPHALEXIN 500 MG PO CAPS: 500.0000 mg | ORAL_CAPSULE | Freq: Four times a day (QID) | ORAL | 0 refills | Status: DC

## 2016-12-31 NOTE — Discharge Instructions (Signed)
Take the prescription as directed.  Call your regular medical doctor on Monday to schedule a follow up appointment within the next 2 to 3 days.  Return to the Emergency Department immediately sooner if worsening.

## 2016-12-31 NOTE — ED Provider Notes (Signed)
[facility] DEPT Provider Note   CSN: [id number] Arrival date & time: 12/31/16  1749     History   Chief Complaint Chief Complaint  Patient presents with  . Urinary Retention    HPI [name] [name] is a 76 y.o. male.  HPI  Pt was seen at 1810. Per pt and his family, c/o gradual onset and persistence of constant "burning" after foley catheter was changed by his Home Health RN this past week (approximately 3 to 4 days ago). Pt feels his foley intermittently "stops draining" which causes him lower abd/suprapubic discomfort (which he has now). Pt's family states pt had a "urine test done" this past week, but they do not know the results. Denies N/V/D, no testicular pain/swelling, no back/flank pain, no hematuria, no penile drainage, no leakage around foley catheter, no CP/SOB, no fevers, no rash.   Past Medical History:  Diagnosis Date  . Adenocarcinoma of right lung, stage 3 ([place]) 09/20/2016  . Atrial fibrillation ([place])    no hx of reported at preop visit of 04/21/11   . CAD (coronary artery disease)   . Carotid artery occlusion    left carotid endarterectomy  . Chronic kidney disease    AAA repair with reimplant of renals   . CHRONIC OBSTRUCTIVE PULMONARY DISEASE    pt denied at visit of 04/21/11   . CORONARY ARTERY DISEASE   . Dehydration 11/15/2016  . Disorder of left sacroiliac joint 08/24/2016  . DIVERTICULOSIS OF COLON   . Encounter for antineoplastic chemotherapy 10/17/2016  . GERD   . H/O hiatal hernia   . Headache(784.0)    Hx: of years of years  . HYPERTENSION   . HYPOTHYROIDISM   . JOINT EFFUSION, KNEE   . KNEE, ARTHRITIS, DEGEN./OSTEO    right knee   . Myocardial infarction ([place])    1994   . NEPHROLITHIASIS, HX OF   . OSTEOPOROSIS   . Other dysphagia   . PERIPHERAL VASCULAR DISEASE    AAA - 1994 with reimplant of renals   . Peripheral vascular disease ([place])    subclavian stenosis PTA - 3/08   . Pure hypercholesterolemia   . Renal artery stenosis [facility])      Patient Active Problem List   Diagnosis Date Noted  . Cerebrovascular accident (CVA) due to thrombosis of right carotid artery ([place]) 12/20/2016  . Cerebrovascular accident (CVA) due to embolism of cerebral artery ([place]) 12/20/2016  . Memory loss 12/20/2016  . Dehydration 11/15/2016  . Encounter for antineoplastic chemotherapy 10/17/2016  . Malnutrition of moderate degree 10/06/2016  . Urinary tract infection 10/03/2016  . T1b N2 M0 disease (stage IIIA).adenocarcinoma of the right middle lobe of lung ([place]) 09/20/2016  . Peripheral edema   . Slow transit constipation   . Acute lower UTI   . Confusion   . SOB (shortness of breath) 09/02/2016  . Urinary incontinence   . Abnormal urinalysis   . Leukocytosis   . HCAP (healthcare-associated pneumonia)   . Hypoalbuminemia due to protein-calorie malnutrition ([place])   . Hypokalemia   . Hyponatremia   . Urinary retention   . Sleep disturbance   . Acute bilateral deep vein thrombosis (DVT) of popliteal veins ([facility])   . Brain mass 08/31/2016  . Debility   . Benign essential HTN   . Scrotal edema   . Hyperlipidemia   . Constipation due to pain medication   . Fall from height of greater than 3 feet   . Acute blood loss anemia   .  Lung mass   . Post-operative pain   . PVD (peripheral vascular disease) ([place])   . Coronary artery disease involving coronary bypass graft of native heart without angina pectoris   . PAF (paroxysmal atrial fibrillation) ([place])   . Disorder of left sacroiliac joint 08/24/2016  . Fall 08/22/2016  . Closed pelvic ring fracture ([place])   . Carotid artery stenosis, asymptomatic 08/12/2014  . Ejection murmur 07/03/2014  . Aftercare following surgery of the circulatory system, [place] 02/11/2014  . Weakness-Abdomin and Bilateral Thigh / Leg 02/11/2014  . Carotid stenosis 07/19/2013  . Occlusion and stenosis of carotid artery without mention of cerebral infarction 07/16/2013  . Impaired fasting glucose 07/14/2013  . Knee  pain 05/23/2011  . Stiffness of joint, not elsewhere classified, lower leg 05/23/2011  . Muscle weakness (generalized) 05/23/2011  . Difficulty in walking(719.7) 05/23/2011  . Bruit 11/19/2010  . AAA (abdominal aortic aneurysm) ([place]) 11/19/2010  . OTHER DYSPHAGIA 04/27/2010  . ATRIAL FIBRILLATION 12/10/2008  . JOINT EFFUSION, KNEE 01/14/2008  . KNEE PAIN 01/14/2008  . KNEE, ARTHRITIS, DEGEN./OSTEO 12/11/2007  . Hypothyroidism 10/10/2007  . Pure hypercholesterolemia 10/10/2007  . Essential hypertension 10/10/2007  . Coronary atherosclerosis 10/10/2007  . Peripheral vascular disease ([place]) 10/10/2007  . CHRONIC OBSTRUCTIVE PULMONARY DISEASE 10/10/2007  . GERD 10/10/2007  . DIVERTICULOSIS OF COLON 10/10/2007  . OSTEOPOROSIS 10/10/2007  . NEPHROLITHIASIS, HX OF 10/10/2007  . Personal history of surgery to heart and great vessels, presenting hazards to health 10/10/2007  . CHOLECYSTECTOMY, HX OF 10/10/2007  . CORONARY ARTERY BYPASS GRAFT, HX OF 10/10/2007    Past Surgical History:  Procedure Laterality Date  . ABDOMINAL AORTIC ANEURYSM REPAIR     with reimplantation of renals   . CARDIAC CATHETERIZATION     2008  . CAROTID ENDARTERECTOMY Left Jan. 30, 2015   CEA  . CHOLECYSTECTOMY  2000   Gall Bladder  . COLONOSCOPY W/ BIOPSIES AND POLYPECTOMY     Hx: of  . CORONARY ARTERY BYPASS GRAFT     1995  . CORONARY STENT PLACEMENT     Hx: of  . ENDARTERECTOMY Left 07/19/2013   Procedure: ENDARTERECTOMY CAROTID;  Surgeon: Mal Misty, MD;  Location: [facility];  Service: Vascular;  Laterality: Left;  . GALLBLADDER SURGERY  2004   . JOINT REPLACEMENT     partial knee replacement on left 2002   . KNEE SURGERY    . ORIF PELVIC FRACTURE Left 08/25/2016   Procedure: OPEN REDUCTION INTERNAL FIXATION (ORIF) PELVIC FRACTURE; plate on front, SI screw on the back;  Surgeon: Altamese Brush, MD;  Location: [facility];  Service: Orthopedics;  Laterality: Left;  . OTHER SURGICAL HISTORY     left subclavian  stenosis surgery PTA 08/2006   . OTHER SURGICAL HISTORY     carotid surgery on right 2004   . TOTAL KNEE ARTHROPLASTY  04/29/2011   Procedure: TOTAL KNEE ARTHROPLASTY;  Surgeon: Gearlean Alf;  Location: [facility];  Service: Orthopedics;  Laterality: Right;  . urinary retention    . VASCULAR SURGERY     AAA  . VASECTOMY  1973  . VIDEO BRONCHOSCOPY WITH ENDOBRONCHIAL ULTRASOUND [name] 09/09/2016   Procedure: VIDEO BRONCHOSCOPY WITH ENDOBRONCHIAL ULTRASOUND;  Surgeon: Grace Isaac, MD;  Location: [facility];  Service: Thoracic;  Laterality: [name];       Home Medications    Prior to Admission medications   Medication Sig Start Date End Date Taking? Authorizing Provider  acetaminophen (TYLENOL) 325 MG tablet Take 325-650 mg  by mouth every 6 (six) hours as needed for headache (for pain).     [provider]  ALPRAZolam Duanne Moron) 0.25 MG tablet Take 1 tablet (0.25 mg total) by mouth 3 (three) times daily as needed for anxiety. 09/16/16   Angiulli, Lavon Paganini, PA-C  bumetanide (BUMEX) 1 MG tablet Take 1 tablet (1 mg total) by mouth daily as needed (swelling). 10/07/16   Kathie Dike, MD  lansoprazole (PREVACID) 15 MG capsule Take 1 capsule (15 mg total) by mouth daily. 09/16/16   Angiulli, Lavon Paganini, PA-C  levothyroxine (SYNTHROID, LEVOTHROID) 112 MCG tablet Take 1 tablet (112 mcg total) by mouth daily. 09/16/16   Angiulli, Lavon Paganini, PA-C  linaclotide [facility]) 145 MCG CAPS capsule Take 1 capsule (145 mcg total) by mouth daily before breakfast. 12/06/16   [name], MD  magic mouthwash w/lidocaine SOLN Composition: 80 cc each of Extra St. Maalox Plus, Diphenhydramine, and Nystatin. Plus 240 cc of 2% viscous lidocaine to make 480 cc. Swish and swallow 2 tsp four times per day. 12/19/16   Tyler Pita, MD  metoprolol tartrate (LOPRESSOR) 25 MG tablet TAKE ONE-HALF TABLET BY MOUTH TWO TIMES DAILY. 12/15/16   [name], MD  niacin (SLO-NIACIN) 500 MG tablet Take 3 tablets (1,500 mg total)  by mouth at bedtime. 09/16/16   Angiulli, Lavon Paganini, PA-C  nitroGLYCERIN (NITROSTAT) 0.4 MG SL tablet Place 1 tablet (0.4 mg total) under the tongue every 5 (five) minutes as needed for chest pain. 09/16/16   Angiulli, Lavon Paganini, PA-C  ondansetron (ZOFRAN) 8 MG tablet Take 1 tablet (8 mg total) by mouth every 8 (eight) hours as needed for nausea or vomiting. 09/29/16   Curt Bears, MD  potassium chloride SA (K-DUR,KLOR-CON) 20 MEQ tablet Take 1 tablet (20 mEq total) by mouth daily. 10/20/16   [name], MD  prochlorperazine (COMPAZINE) 10 MG tablet Take 1 tablet (10 mg total) by mouth every 6 (six) hours as needed for nausea or vomiting. 10/17/16   Curt Bears, MD  rosuvastatin (CRESTOR) 20 MG tablet Take 1 tablet (20 mg total) by mouth daily. 09/16/16   Angiulli, Lavon Paganini, PA-C  sucralfate (CARAFATE) 1 GM/10ML suspension Take 10 mLs (1 g total) by mouth 4 (four) times daily -  with meals and at bedtime. 11/10/16   Bruning, Ashlyn, PA-C  tamsulosin (FLOMAX) 0.4 MG CAPS capsule Take 1 capsule (0.4 mg total) by mouth daily after breakfast. 09/29/16   Jamse Arn, MD  warfarin (COUMADIN) 5 MG tablet 1-1/2 tablet every day except 2 tablets Tuesday Thursday Saturday 09/16/16   Angiulli, Lavon Paganini, PA-C    Family History Family History  Problem Relation Age of Onset  . Hypertension Mother   . Heart disease Father   . Heart attack Father   . Diabetes Sister   . Cancer Brother 76       Lung  . Diabetes Sister     Social History Social History  Substance Use Topics  . Smoking status: Former Smoker    Types: Cigarettes    Quit date: 06/20/1992  . Smokeless tobacco: Never Used  . Alcohol use No     Allergies   Neurontin [gabapentin]; Imitrex [sumatriptan]; and Lipitor [atorvastatin]   Review of Systems Review of Systems ROS: Statement: All systems negative except as marked or noted in the HPI; Constitutional: Negative for fever and chills. ; ; Eyes: Negative for eye pain,  redness and discharge. ; ; ENMT: Negative for ear pain, hoarseness, nasal  congestion, sinus pressure and sore throat. ; ; Cardiovascular: Negative for chest pain, palpitations, diaphoresis, dyspnea and peripheral edema. ; ; Respiratory: Negative for cough, wheezing and stridor. ; ; Gastrointestinal: Negative for nausea, vomiting, diarrhea, abdominal pain, blood in stool, hematemesis, jaundice and rectal bleeding. . ; ; Genitourinary: +dysuria. Negative for flank pain and hematuria. ; ; Genital:  No penile drainage or rash, no testicular pain or swelling, no scrotal rash or swelling. ;; Musculoskeletal: Negative for back pain and neck pain. Negative for swelling and trauma.; ; Skin: Negative for pruritus, rash, abrasions, blisters, bruising and skin lesion.; ; Neuro: Negative for headache, lightheadedness and neck stiffness. Negative for weakness, altered level of consciousness, altered mental status, extremity weakness, paresthesias, involuntary movement, seizure and syncope.       Physical Exam Updated Vital Signs BP (!) 144/82   Pulse (!) 123   Temp 98 F (36.7 C)   Resp 18   Ht 5\' 7"  (1.702 m)   Wt 60.8 kg (134 lb)   SpO2 99%   BMI 20.99 kg/m    BP (!) 118/48   Pulse 85   Temp 98 F (36.7 C)   Resp 16   Ht 5\' 7"  (1.702 m)   Wt 60.8 kg (134 lb)   SpO2 97%   BMI 20.99 kg/m    Physical Exam 1815: Physical examination:  Nursing notes reviewed; Vital signs and O2 SAT reviewed;  Constitutional: Well developed, Well nourished, Well hydrated, Uncomfortable appearing.; Head:  Normocephalic, atraumatic; Eyes: EOMI, PERRL, No scleral icterus; ENMT: Mouth and pharynx normal, Mucous membranes moist; Neck: Supple, Full range of motion, No lymphadenopathy; Cardiovascular: Tachycardic rate and rhythm, No gallop; Respiratory: Breath sounds clear & equal bilaterally, No wheezes.  Speaking full sentences with ease, Normal respiratory effort/excursion; Chest: Nontender, Movement normal; Abdomen: Soft,  Nontender, Nondistended, Normal bowel sounds; Genitourinary: No CVA tenderness. +foley intermittently draining clear/yellow urine.; Extremities: Pulses normal, No tenderness, No edema, No calf edema or asymmetry.; Neuro: AA&Ox3, Major CN grossly intact.  Speech clear. No gross focal motor or sensory deficits in extremities.; Skin: Color normal, Warm, Dry.   ED Treatments / Results  Labs (all labs ordered are listed, but only abnormal results are displayed)   EKG  EKG Interpretation None       Radiology   Procedures Procedures (including critical care time)  Medications Ordered in ED Medications - No data to display   Initial Impression / Assessment and Plan / ED Course  I have reviewed the triage vital signs and the nursing notes.  Pertinent labs & imaging results that were available during my care of the patient were reviewed by me and considered in my medical decision making (see chart for details).  MDM Reviewed: previous chart, nursing note and vitals Reviewed previous: labs Interpretation: labs   Results for orders placed or performed during the hospital encounter of 12/31/16  Urinalysis, Routine w reflex microscopic  Result Value Ref Range   Color, Urine STRAW (A) YELLOW   APPearance CLEAR CLEAR   Specific Gravity, Urine 1.003 (L) 1.005 - 1.030   pH 7.0 5.0 - 8.0   Glucose, UA NEGATIVE NEGATIVE mg/dL   Hgb urine dipstick NEGATIVE NEGATIVE   Bilirubin Urine NEGATIVE NEGATIVE   Ketones, ur NEGATIVE NEGATIVE mg/dL   Protein, ur NEGATIVE NEGATIVE mg/dL   Nitrite NEGATIVE NEGATIVE   Leukocytes, UA MODERATE (A) NEGATIVE   RBC / HPF 0-5 0 - 5 RBC/hpf   WBC, UA 6-30 0 - 5 WBC/hpf  Bacteria, UA RARE (A) NONE SEEN   Squamous Epithelial / LPF 0-5 (A) NONE SEEN    1945:  INR 2.2 on 12/28/16. CMP and CBC yesterday with normal BUN/Cr and WBC count. Will not repeat today. Udip as above, UC pending. Foley tubing was kinked and tubing going into bag was stopping urine  flow. Foley tubing and bag removed and replaced with resultant brisk urine flow. Will re-check bladder scan.  2000:  Total urine output 1873ml. Bladder scan now with 4ml. Will tx for UTI with keflex while UC pending. Pt states he feels better and is ready to go home now. Dx and testing d/w pt and family.  Questions answered.  Verb understanding, agreeable to d/c home with outpt f/u.       Final Clinical Impressions(s) / ED Diagnoses   Final diagnoses:  None    New Prescriptions New Prescriptions   No medications on file     Francine Graven, DO 01/04/17 0809

## 2016-12-31 NOTE — ED Notes (Signed)
Pt with foley catheter in place since March this year, recently changed on Tuesday and pt c/o burning since.  Foley patent and draining clear yellow urine.

## 2016-12-31 NOTE — ED Notes (Signed)
Pt has 55mL per bladder scanner.

## 2016-12-31 NOTE — ED Notes (Signed)
Bladder scanner reveals 2108mL.

## 2016-12-31 NOTE — ED Triage Notes (Signed)
Pt c/o burning and urinary retention since having foley catheter changed by home health nurse on Tuesday or Wednesday of this week.

## 2017-01-02 ENCOUNTER — Telehealth: Payer: Self-pay | Admitting: Neurology

## 2017-01-02 ENCOUNTER — Telehealth: Payer: Self-pay | Admitting: Internal Medicine

## 2017-01-02 ENCOUNTER — Telehealth: Payer: Self-pay | Admitting: Family Medicine

## 2017-01-02 ENCOUNTER — Encounter: Payer: Self-pay | Admitting: Internal Medicine

## 2017-01-02 ENCOUNTER — Ambulatory Visit (HOSPITAL_BASED_OUTPATIENT_CLINIC_OR_DEPARTMENT_OTHER): Payer: Medicare Other | Admitting: Internal Medicine

## 2017-01-02 ENCOUNTER — Other Ambulatory Visit: Payer: Self-pay

## 2017-01-02 VITALS — BP 109/47 | HR 79 | Temp 97.8°F | Resp 18 | Ht 67.0 in | Wt 136.2 lb

## 2017-01-02 DIAGNOSIS — C3411 Malignant neoplasm of upper lobe, right bronchus or lung: Secondary | ICD-10-CM

## 2017-01-02 DIAGNOSIS — Z8673 Personal history of transient ischemic attack (TIA), and cerebral infarction without residual deficits: Secondary | ICD-10-CM

## 2017-01-02 DIAGNOSIS — E032 Hypothyroidism due to medicaments and other exogenous substances: Secondary | ICD-10-CM

## 2017-01-02 DIAGNOSIS — Z5112 Encounter for antineoplastic immunotherapy: Secondary | ICD-10-CM

## 2017-01-02 DIAGNOSIS — N390 Urinary tract infection, site not specified: Secondary | ICD-10-CM | POA: Diagnosis not present

## 2017-01-02 DIAGNOSIS — Z7189 Other specified counseling: Secondary | ICD-10-CM

## 2017-01-02 DIAGNOSIS — I6529 Occlusion and stenosis of unspecified carotid artery: Secondary | ICD-10-CM

## 2017-01-02 DIAGNOSIS — R338 Other retention of urine: Secondary | ICD-10-CM | POA: Diagnosis not present

## 2017-01-02 DIAGNOSIS — C342 Malignant neoplasm of middle lobe, bronchus or lung: Secondary | ICD-10-CM

## 2017-01-02 LAB — URINE CULTURE

## 2017-01-02 NOTE — Telephone Encounter (Signed)
Order placed in system

## 2017-01-02 NOTE — Telephone Encounter (Signed)
Scheduled appt per 7/16 los - Gave patient AVS and calender per los.  

## 2017-01-02 NOTE — Telephone Encounter (Signed)
Spoke to wife about CTA results, CTA head looks fine, but there is a lot of stenosis and plaque in the common carotid arteries, R>L. He had been seeing vascular surgeon Dr. Kellie Simmering, instructed wife to call their office for appointment. We will also send a referral for earlier follow-up.

## 2017-01-02 NOTE — Telephone Encounter (Signed)
See results to urine culture faxed over by [facility] with [facility].  Located in yellow box.

## 2017-01-02 NOTE — Progress Notes (Signed)
[facility] [phone]   [phone]  OFFICE PROGRESS NOTE  [name], MD [address] [place] [address]  DIAGNOSIS: Stage IIIA (T1b, N2, M0) non-small cell lung cancer, adenocarcinoma with positive PDL 1 expression 50% diagnosed in March 2018 and presented with right upper lobe lung nodule in addition to mediastinal lymphadenopathy.  PRIOR THERAPY: Course of concurrent chemoradiation with weekly carboplatin for AUC of 2 and paclitaxel 45 MG/M2. First dose of chemotherapy 10/17/2016. Status post 8 cycles. Last dose was given 12/05/2016 with partial response.[name]  CURRENT THERAPY: Consolidation treatment with immunotherapy with Imfinzi (Durvalumab) 10 MG/KG every 2 weeks. First dose 01/18/2017  INTERVAL HISTORY: [name] [name] 76 y.o. male returns to the clinic today for follow-up visit accompanied by his wife and son. The patient tolerated the previous course of concurrent chemoradiation fairly well except for mild odynophagia. He denied having any significant weight loss or night sweats. He has no nausea, vomiting, diarrhea or constipation. He has no chest pain but continues to have shortness breath with exertion was no cough or hemoptysis. He was recently treated for urinary tract infection and currently on Keflex. He continues to have Foley's catheter for the urinary retention. The patient had repeat CT scan of the chest performed recently and he is here for evaluation and discussion of his scan results.  MEDICAL HISTORY: Past Medical History:  Diagnosis Date  . Adenocarcinoma of right lung, stage 3 ([facility]) 09/20/2016  . Atrial fibrillation ([facility])    no hx of reported at preop visit of 04/21/11   . CAD (coronary artery disease)   . Carotid artery occlusion    left carotid endarterectomy  . Chronic kidney disease    AAA repair with reimplant of renals   . CHRONIC OBSTRUCTIVE PULMONARY DISEASE    pt denied at visit of 04/21/11   .  CORONARY ARTERY DISEASE   . Dehydration 11/15/2016  . Disorder of left sacroiliac joint 08/24/2016  . DIVERTICULOSIS OF COLON   . Encounter for antineoplastic chemotherapy 10/17/2016  . GERD   . H/O hiatal hernia   . Headache(784.0)    Hx: of years of years  . HYPERTENSION   . HYPOTHYROIDISM   . JOINT EFFUSION, KNEE   . KNEE, ARTHRITIS, DEGEN./OSTEO    right knee   . Myocardial infarction ([facility])    1994   . NEPHROLITHIASIS, HX OF   . OSTEOPOROSIS   . Other dysphagia   . PERIPHERAL VASCULAR DISEASE    AAA - 1994 with reimplant of renals   . Peripheral vascular disease ([facility])    subclavian stenosis PTA - 3/08   . Pure hypercholesterolemia   . Renal artery stenosis ([facility])     ALLERGIES:  is allergic to neurontin [gabapentin]; imitrex [sumatriptan]; and lipitor [atorvastatin].  MEDICATIONS:  Current Outpatient Prescriptions  Medication Sig Dispense Refill  . acetaminophen (TYLENOL) 325 MG tablet Take 325-650 mg by mouth every 6 (six) hours as needed for headache (for pain).     [name] ALPRAZolam (XANAX) 0.25 MG tablet Take 1 tablet (0.25 mg total) by mouth 3 (three) times daily as needed for anxiety. 30 tablet 0  . bumetanide (BUMEX) 1 MG tablet Take 1 tablet (1 mg total) by mouth daily as needed (swelling). 30 tablet 1  . cephALEXin (KEFLEX) 500 MG capsule Take 1 capsule (500 mg total) by mouth 4 (four) times daily. 40 capsule 0  . lansoprazole (PREVACID) 15 MG capsule Take 1  capsule (15 mg total) by mouth daily. 30 capsule 5  . levothyroxine (SYNTHROID, LEVOTHROID) 112 MCG tablet Take 1 tablet (112 mcg total) by mouth daily. 30 tablet 5  . linaclotide (LINZESS) 145 MCG CAPS capsule Take 1 capsule (145 mcg total) by mouth daily before breakfast. 30 capsule 11  . magic mouthwash w/lidocaine SOLN Composition: 80 cc each of Extra St. Maalox Plus, Diphenhydramine, and Nystatin. Plus 240 cc of 2% viscous lidocaine to make 480 cc. Swish and swallow 2 tsp four times per day. 480 mL 2  . metoprolol  tartrate (LOPRESSOR) 25 MG tablet TAKE ONE-HALF TABLET BY MOUTH TWO TIMES DAILY. 30 tablet 5  . niacin (SLO-NIACIN) 500 MG tablet Take 3 tablets (1,500 mg total) by mouth at bedtime. 30 tablet 0  . potassium chloride SA (K-DUR,KLOR-CON) 20 MEQ tablet Take 1 tablet (20 mEq total) by mouth daily. 30 tablet 11  . rosuvastatin (CRESTOR) 20 MG tablet Take 1 tablet (20 mg total) by mouth daily. 30 tablet 5  . sucralfate (CARAFATE) 1 GM/10ML suspension Take 10 mLs (1 g total) by mouth 4 (four) times daily -  with meals and at bedtime. 420 mL 1  . tamsulosin (FLOMAX) 0.4 MG CAPS capsule Take 1 capsule (0.4 mg total) by mouth daily after breakfast. 30 capsule 1  . warfarin (COUMADIN) 5 MG tablet 1-1/2 tablet every day except 2 tablets Tuesday Thursday Saturday 90 tablet 1  . nitroGLYCERIN (NITROSTAT) 0.4 MG SL tablet Place 1 tablet (0.4 mg total) under the tongue every 5 (five) minutes as needed for chest pain. (Patient not taking: Reported on 01/02/2017) 25 tablet 10  . ondansetron (ZOFRAN) 8 MG tablet Take 1 tablet (8 mg total) by mouth every 8 (eight) hours as needed for nausea or vomiting. (Patient not taking: Reported on 01/02/2017) 20 tablet 0  . prochlorperazine (COMPAZINE) 10 MG tablet Take 1 tablet (10 mg total) by mouth every 6 (six) hours as needed for nausea or vomiting. (Patient not taking: Reported on 01/02/2017) 30 tablet 0   No current facility-administered medications for this visit.     SURGICAL HISTORY:  Past Surgical History:  Procedure Laterality Date  . ABDOMINAL AORTIC ANEURYSM REPAIR     with reimplantation of renals   . CARDIAC CATHETERIZATION     2008  . CAROTID ENDARTERECTOMY Left Jan. 30, 2015   CEA  . CHOLECYSTECTOMY  2000   Gall Bladder  . COLONOSCOPY W/ BIOPSIES AND POLYPECTOMY     Hx: of  . CORONARY ARTERY BYPASS GRAFT     1995  . CORONARY STENT PLACEMENT     Hx: of  . ENDARTERECTOMY Left 07/19/2013   Procedure: ENDARTERECTOMY CAROTID;  Surgeon: Mal Misty, MD;   Location: [facility];  Service: Vascular;  Laterality: Left;  . GALLBLADDER SURGERY  2004   . JOINT REPLACEMENT     partial knee replacement on left 2002   . KNEE SURGERY    . ORIF PELVIC FRACTURE Left 08/25/2016   Procedure: OPEN REDUCTION INTERNAL FIXATION (ORIF) PELVIC FRACTURE; plate on front, SI screw on the back;  Surgeon: Altamese Claypool, MD;  Location: [facility];  Service: Orthopedics;  Laterality: Left;  . OTHER SURGICAL HISTORY     left subclavian stenosis surgery PTA 08/2006   . OTHER SURGICAL HISTORY     carotid surgery on right 2004   . TOTAL KNEE ARTHROPLASTY  04/29/2011   Procedure: TOTAL KNEE ARTHROPLASTY;  Surgeon: Gearlean Alf;  Location: [facility];  Service: Orthopedics;  Laterality: Right;  . urinary retention    . VASCULAR SURGERY     AAA  . VASECTOMY  1973  . VIDEO BRONCHOSCOPY WITH ENDOBRONCHIAL ULTRASOUND [name] 09/09/2016   Procedure: VIDEO BRONCHOSCOPY WITH ENDOBRONCHIAL ULTRASOUND;  Surgeon: Grace Isaac, MD;  Location: [facility];  Service: Thoracic;  Laterality: [name];    REVIEW OF SYSTEMS:  Constitutional: positive for fatigue Eyes: negative Ears, nose, mouth, throat, and face: negative Respiratory: positive for dyspnea on exertion Cardiovascular: negative Gastrointestinal: negative Genitourinary:positive for decreased stream and urinary incontinence Integument/breast: negative Hematologic/lymphatic: negative Musculoskeletal:negative Neurological: negative Behavioral/Psych: negative Endocrine: negative Allergic/Immunologic: negative   PHYSICAL EXAMINATION: General appearance: alert, cooperative, fatigued and no distress Head: Normocephalic, without obvious abnormality, atraumatic Neck: no adenopathy, no JVD, supple, symmetrical, trachea midline and thyroid not enlarged, symmetric, no tenderness/mass/nodules Lymph nodes: Cervical, supraclavicular, and axillary nodes normal. Resp: clear to auscultation bilaterally Back: symmetric, no curvature. ROM normal. No CVA  tenderness. Cardio: regular rate and rhythm, S1, S2 normal, no murmur, click, rub or gallop GI: soft, non-tender; bowel sounds normal; no masses,  no organomegaly Extremities: extremities normal, atraumatic, no cyanosis or edema Neurologic: Alert and oriented X 3, normal strength and tone. Normal symmetric reflexes. Normal coordination and gait  ECOG PERFORMANCE STATUS: 1 - Symptomatic but completely ambulatory  Blood pressure (!) 109/47, pulse 79, temperature 97.8 F (36.6 C), temperature source Oral, resp. rate 18, height 5\' 7"  (1.702 m), weight 136 lb 3.2 oz (61.8 kg), SpO2 100 %.  LABORATORY DATA: Lab Results  Component Value Date   WBC 4.7 12/30/2016   HGB 11.8 (L) 12/30/2016   HCT 33.2 (L) 12/30/2016   MCV 90.7 12/30/2016   PLT 111 (L) 12/30/2016      Chemistry      Component Value Date/Time   NA 134 (L) 12/30/2016 0935   K 4.1 12/30/2016 0935   CL 100 (L) 10/06/2016 0611   CO2 28 12/30/2016 0935   BUN 13.7 12/30/2016 0935   CREATININE 0.8 12/30/2016 0935      Component Value Date/Time   CALCIUM 9.9 12/30/2016 0935   ALKPHOS 105 12/30/2016 0935   AST 23 12/30/2016 0935   ALT 17 12/30/2016 0935   BILITOT 0.56 12/30/2016 0935       RADIOGRAPHIC STUDIES: Ct Angio Head W Or Wo Contrast  Result Date: 12/30/2016 CLINICAL DATA:  Recent stroke right MCA territory. History of bilateral carotid endarterectomy. Lung cancer EXAM: CT ANGIOGRAPHY HEAD AND NECK TECHNIQUE: Multidetector CT imaging of the head and neck was performed using the standard protocol during bolus administration of intravenous contrast. Multiplanar CT image reconstructions and MIPs were obtained to evaluate the vascular anatomy. Carotid stenosis measurements (when applicable) are obtained utilizing NASCET criteria, using the distal internal carotid diameter as the denominator. CONTRAST:  76mL ISOVUE-300 IOPAMIDOL (ISOVUE-300) INJECTION 61% COMPARISON:  MRI head 10/15/2016 FINDINGS: CT HEAD FINDINGS Brain:  Moderate atrophy. Chronic ischemic changes in the white matter bilaterally. Chronic infarcts in the frontal lobes bilaterally. Negative for acute infarct, hemorrhage, or mass lesion. Vascular: Atherosclerotic calcification in the carotid artery bilaterally. Negative for hyperdense vessel Skull: Negative Sinuses: Negative Orbits: Negative Review of the MIP images confirms the above findings CTA NECK FINDINGS Aortic arch: Extensive atherosclerotic calcification in the aortic arch. Right carotid system: Mild stenosis of the proximal right common carotid artery. Carotid endarterectomy extends proximally into the common carotid artery and is patulous. There is a severe stenosis of the distal common carotid artery measuring approximately 80% diameter stenosis. The plaque  is irregular with linear filling defect and ulceration. Possible linear thrombus vs plaque. External carotid artery is moderately narrowed at the origin. Left carotid system: Stenting of the left subclavian artery which is patent. Extensive calcification proximal left common carotid artery with long Segment 50%- 60% diameter stenosis. Left carotid endarterectomy graft extends proximally in the common carotid artery and is widely patent. Left carotid bifurcation patent. No significant internal carotid artery stenosis on the left. Vertebral arteries: Mild stenosis origin right vertebral artery. Scattered atherosclerotic disease in the right vertebral artery. Left vertebral artery patent without significant stenosis. Skeleton: No acute abnormality. Other neck: Negative Upper chest: Negative Review of the MIP images confirms the above findings CTA HEAD FINDINGS Anterior circulation: Atherosclerotic calcification in the cavernous carotid bilaterally without significant stenosis. Anterior and middle cerebral artery patent without significant stenosis. Posterior circulation: Both vertebral arteries widely patent to the basilar. Basilar widely patent. Left PICA  patent. Right PICA not visualized. Bilateral AICA, superior cerebellar, and posterior cerebral artery patent without significant stenosis. Venous sinuses: Negative Anatomic variants: Negative Delayed phase: No enhancing intracranial lesion is seen postcontrast administration. Review of the MIP images confirms the above findings IMPRESSION: Right carotid endarterectomy. 80% diameter stenosis distal right common carotid artery. Irregular plaque with ulceration possible thrombus. Right internal carotid artery is patent. Moderate narrowing of the right external carotid artery. Left carotid endarterectomy patent without significant stenosis. There is extensive atherosclerotic disease left common carotid artery which is heavily calcified with 50-60% diameter long segment stenosis. Both vertebral arteries widely patent No significant intracranial stenosis or large vessel occlusion. Electronically Signed   By: Franchot Gallo M.D.   On: 12/30/2016 13:52   Ct Angio Neck W Or Wo Contrast  Result Date: 12/30/2016 CLINICAL DATA:  Recent stroke right MCA territory. History of bilateral carotid endarterectomy. Lung cancer EXAM: CT ANGIOGRAPHY HEAD AND NECK TECHNIQUE: Multidetector CT imaging of the head and neck was performed using the standard protocol during bolus administration of intravenous contrast. Multiplanar CT image reconstructions and MIPs were obtained to evaluate the vascular anatomy. Carotid stenosis measurements (when applicable) are obtained utilizing NASCET criteria, using the distal internal carotid diameter as the denominator. CONTRAST:  19mL ISOVUE-300 IOPAMIDOL (ISOVUE-300) INJECTION 61% COMPARISON:  MRI head 10/15/2016 FINDINGS: CT HEAD FINDINGS Brain: Moderate atrophy. Chronic ischemic changes in the white matter bilaterally. Chronic infarcts in the frontal lobes bilaterally. Negative for acute infarct, hemorrhage, or mass lesion. Vascular: Atherosclerotic calcification in the carotid artery bilaterally.  Negative for hyperdense vessel Skull: Negative Sinuses: Negative Orbits: Negative Review of the MIP images confirms the above findings CTA NECK FINDINGS Aortic arch: Extensive atherosclerotic calcification in the aortic arch. Right carotid system: Mild stenosis of the proximal right common carotid artery. Carotid endarterectomy extends proximally into the common carotid artery and is patulous. There is a severe stenosis of the distal common carotid artery measuring approximately 80% diameter stenosis. The plaque is irregular with linear filling defect and ulceration. Possible linear thrombus vs plaque. External carotid artery is moderately narrowed at the origin. Left carotid system: Stenting of the left subclavian artery which is patent. Extensive calcification proximal left common carotid artery with long Segment 50%- 60% diameter stenosis. Left carotid endarterectomy graft extends proximally in the common carotid artery and is widely patent. Left carotid bifurcation patent. No significant internal carotid artery stenosis on the left. Vertebral arteries: Mild stenosis origin right vertebral artery. Scattered atherosclerotic disease in the right vertebral artery. Left vertebral artery patent without significant stenosis. Skeleton: No acute abnormality.  Other neck: Negative Upper chest: Negative Review of the MIP images confirms the above findings CTA HEAD FINDINGS Anterior circulation: Atherosclerotic calcification in the cavernous carotid bilaterally without significant stenosis. Anterior and middle cerebral artery patent without significant stenosis. Posterior circulation: Both vertebral arteries widely patent to the basilar. Basilar widely patent. Left PICA patent. Right PICA not visualized. Bilateral AICA, superior cerebellar, and posterior cerebral artery patent without significant stenosis. Venous sinuses: Negative Anatomic variants: Negative Delayed phase: No enhancing intracranial lesion is seen  postcontrast administration. Review of the MIP images confirms the above findings IMPRESSION: Right carotid endarterectomy. 80% diameter stenosis distal right common carotid artery. Irregular plaque with ulceration possible thrombus. Right internal carotid artery is patent. Moderate narrowing of the right external carotid artery. Left carotid endarterectomy patent without significant stenosis. There is extensive atherosclerotic disease left common carotid artery which is heavily calcified with 50-60% diameter long segment stenosis. Both vertebral arteries widely patent No significant intracranial stenosis or large vessel occlusion. Electronically Signed   By: Franchot Gallo M.D.   On: 12/30/2016 13:52   Ct Chest W Contrast  Result Date: 12/30/2016 CLINICAL DATA:  Right middle lobe lung cancer on restaging EXAM: CT CHEST WITH CONTRAST TECHNIQUE: Multidetector CT imaging of the chest was performed during intravenous contrast administration. CONTRAST:  39mL ISOVUE-300 IOPAMIDOL (ISOVUE-300) INJECTION 61% COMPARISON:  PET-CT dated 09/29/2016 FINDINGS: Cardiovascular: Heart is normal in size.  No pericardial effusion. Three vessel coronary atherosclerosis. Postsurgical changes related to prior CABG. No evidence of thoracic aortic aneurysm. Atherosclerotic calcifications of the aortic arch. Left subclavian artery stent. Mediastinum/Nodes: 8 mm short axis subcarinal node (series 3/ image 72), previously 18 mm. 7 mm short axis right hilar node (series 3/ image 9), poorly visualized on prior PET. Visualized thyroid is unremarkable. Lungs/Pleura: 1.6 x 1.4 cm spiculated right middle lobe nodule (series 6/ image 87), previously 1.9 x 2.0 cm. Surrounding radiation changes. Mild paraseptal emphysematous changes, upper lobe predominant. Mild subpleural reticulation/fibrosis in the lungs bilaterally. No focal consolidation. No pleural effusion or pneumothorax. Upper Abdomen: Visualized upper abdomen is unremarkable, noting  cholecystectomy clips. Musculoskeletal: Mild superior endplate compression fracture deformity at T6. Mild inferior endplate compression fracture deformity at T8. These are both new from prior CT chest dated 08/22/2016. Median sternotomy. IMPRESSION: 1.6 x 1.4 cm spiculated right middle lobe nodule, corresponding to known primary bronchogenic neoplasm, decreased. Surrounding radiation changes. Small subcarinal and right hilar lymph nodes measuring up to 8 mm short axis, possibly reflecting nodal metastases. Mild compression fracture deformities involving T6 and T8. Aortic Atherosclerosis (ICD10-I70.0) and Emphysema (ICD10-J43.9). Electronically Signed   By: Julian Hy M.D.   On: 12/30/2016 14:29    ASSESSMENT AND PLAN:  This is a very pleasant 75 years old white male with a stage IIIa non-small cell lung cancer, adenocarcinoma with positive PDL 1 expression of 50%. The patient completed a course of concurrent chemoradiation with weekly carboplatin and paclitaxel status post 8 weeks and tolerated his treatment well except for mild dysphagia and odynophagia. He had repeat CT scan of the chest performed recently. I percent and independently reviewed the scan images and discuss the results with the patient and his family. His scan showed decrease in the right middle lobe pulmonary nodule as well as a small subcarinal and right hilar lymphadenopathy. I had a lengthy discussion with the patient his family about his further treatment options including observation versus consideration of treatment with consolidation immunotherapy with Imfinzi (Durvalumab) 10 MG/KG every 2 weeks. I discussed with the  patient adverse effect of the immunotherapy including but not limited to immunotherapy mediated skin rash, diarrhea, inflammation of the lung, kidney, liver, thyroid or other endocrine dysfunction including diabetes mellitus. The patient and his family would like to proceed with the immunotherapy. He is expected  to start the first dose of this treatment on 01/18/2017. I'll see him back for follow-up visit with the start of cycle #2. For the tract infection and urinary retention, the patient will continue his current treatment with Keflex as ordered by his urologist. He was advised to call immediately if he has any concerning symptoms in the interval. The patient voices understanding of current disease status and treatment options and is in agreement with the current care plan. All questions were answered. The patient knows to call the clinic with any problems, questions or concerns. We can certainly see the patient much sooner if necessary.  Disclaimer: This note was dictated with voice recognition software. Similar sounding words can inadvertently be transcribed and may not be corrected upon review.

## 2017-01-02 NOTE — Progress Notes (Signed)
DISCONTINUE ON PATHWAY REGIMEN - Non-Small Cell Lung     Administer weekly:     Paclitaxel      Carboplatin   **Always confirm dose/schedule in your pharmacy ordering system**    REASON: Continuation Of Treatment PRIOR TREATMENT: MLY650: Carboplatin AUC=2 + Paclitaxel 45 mg/m2 Weekly During Radiation TREATMENT RESPONSE: Partial Response (PR)  START ON PATHWAY REGIMEN - Non-Small Cell Lung     A cycle is every 14 days:     Durvalumab   **Always confirm dose/schedule in your pharmacy ordering system**    Patient Characteristics: Stage III - Unresectable, PS = 0, 1 AJCC T Category: T1c Current Disease Status: No Distant Mets or Local Recurrence AJCC N Category: N2 AJCC M Category: M0 AJCC 8 Stage Grouping: IIIA Performance Status: PS = 0, 1 Intent of Therapy: Curative Intent, Discussed with Patient

## 2017-01-03 DIAGNOSIS — I739 Peripheral vascular disease, unspecified: Secondary | ICD-10-CM | POA: Diagnosis not present

## 2017-01-03 DIAGNOSIS — I4891 Unspecified atrial fibrillation: Secondary | ICD-10-CM | POA: Diagnosis not present

## 2017-01-03 DIAGNOSIS — Z466 Encounter for fitting and adjustment of urinary device: Secondary | ICD-10-CM | POA: Diagnosis not present

## 2017-01-03 DIAGNOSIS — S329XXD Fracture of unspecified parts of lumbosacral spine and pelvis, subsequent encounter for fracture with routine healing: Secondary | ICD-10-CM | POA: Diagnosis not present

## 2017-01-03 DIAGNOSIS — C8521 Mediastinal (thymic) large B-cell lymphoma, lymph nodes of head, face, and neck: Secondary | ICD-10-CM | POA: Diagnosis not present

## 2017-01-03 DIAGNOSIS — I2510 Atherosclerotic heart disease of native coronary artery without angina pectoris: Secondary | ICD-10-CM | POA: Diagnosis not present

## 2017-01-04 ENCOUNTER — Other Ambulatory Visit: Payer: Self-pay | Admitting: *Deleted

## 2017-01-04 ENCOUNTER — Other Ambulatory Visit: Payer: Self-pay

## 2017-01-04 ENCOUNTER — Ambulatory Visit (HOSPITAL_COMMUNITY): Payer: Medicare Other | Attending: Cardiology

## 2017-01-04 DIAGNOSIS — I6340 Cerebral infarction due to embolism of unspecified cerebral artery: Secondary | ICD-10-CM | POA: Insufficient documentation

## 2017-01-04 DIAGNOSIS — I63031 Cerebral infarction due to thrombosis of right carotid artery: Secondary | ICD-10-CM | POA: Insufficient documentation

## 2017-01-04 DIAGNOSIS — R413 Other amnesia: Secondary | ICD-10-CM | POA: Diagnosis not present

## 2017-01-04 DIAGNOSIS — I2510 Atherosclerotic heart disease of native coronary artery without angina pectoris: Secondary | ICD-10-CM | POA: Diagnosis not present

## 2017-01-04 DIAGNOSIS — E785 Hyperlipidemia, unspecified: Secondary | ICD-10-CM | POA: Diagnosis not present

## 2017-01-04 DIAGNOSIS — I371 Nonrheumatic pulmonary valve insufficiency: Secondary | ICD-10-CM | POA: Insufficient documentation

## 2017-01-04 DIAGNOSIS — I081 Rheumatic disorders of both mitral and tricuspid valves: Secondary | ICD-10-CM | POA: Insufficient documentation

## 2017-01-04 DIAGNOSIS — I10 Essential (primary) hypertension: Secondary | ICD-10-CM | POA: Diagnosis not present

## 2017-01-04 DIAGNOSIS — I4891 Unspecified atrial fibrillation: Secondary | ICD-10-CM | POA: Insufficient documentation

## 2017-01-04 DIAGNOSIS — Z951 Presence of aortocoronary bypass graft: Secondary | ICD-10-CM | POA: Diagnosis not present

## 2017-01-04 MED ORDER — NITROFURANTOIN MONOHYD MACRO 100 MG PO CAPS: 100.0000 mg | ORAL_CAPSULE | Freq: Two times a day (BID) | ORAL | 0 refills | Status: DC

## 2017-01-04 NOTE — Telephone Encounter (Signed)
Culture back. Stop Keflex. Start Macrobid 100 mg twice a day 7 days

## 2017-01-04 NOTE — Telephone Encounter (Signed)
Left message to return call to notify pt. [facility] notified. Med sent to pharm.

## 2017-01-05 ENCOUNTER — Encounter (HOSPITAL_COMMUNITY): Payer: Self-pay | Admitting: Emergency Medicine

## 2017-01-05 ENCOUNTER — Emergency Department (HOSPITAL_COMMUNITY)
Admission: EM | Admit: 2017-01-05 | Discharge: 2017-01-05 | Disposition: A | Discharge status: Home / Self Care | Payer: Medicare Other | Attending: Emergency Medicine | Admitting: Emergency Medicine

## 2017-01-05 DIAGNOSIS — I2510 Atherosclerotic heart disease of native coronary artery without angina pectoris: Secondary | ICD-10-CM | POA: Diagnosis not present

## 2017-01-05 DIAGNOSIS — Z466 Encounter for fitting and adjustment of urinary device: Secondary | ICD-10-CM | POA: Insufficient documentation

## 2017-01-05 DIAGNOSIS — Z87891 Personal history of nicotine dependence: Secondary | ICD-10-CM | POA: Insufficient documentation

## 2017-01-05 DIAGNOSIS — I10 Essential (primary) hypertension: Secondary | ICD-10-CM | POA: Insufficient documentation

## 2017-01-05 DIAGNOSIS — E039 Hypothyroidism, unspecified: Secondary | ICD-10-CM | POA: Insufficient documentation

## 2017-01-05 DIAGNOSIS — R338 Other retention of urine: Secondary | ICD-10-CM | POA: Insufficient documentation

## 2017-01-05 DIAGNOSIS — J449 Chronic obstructive pulmonary disease, unspecified: Secondary | ICD-10-CM | POA: Insufficient documentation

## 2017-01-05 DIAGNOSIS — Z79899 Other long term (current) drug therapy: Secondary | ICD-10-CM | POA: Insufficient documentation

## 2017-01-05 DIAGNOSIS — Z951 Presence of aortocoronary bypass graft: Secondary | ICD-10-CM | POA: Diagnosis not present

## 2017-01-05 DIAGNOSIS — Z96653 Presence of artificial knee joint, bilateral: Secondary | ICD-10-CM | POA: Insufficient documentation

## 2017-01-05 DIAGNOSIS — Z955 Presence of coronary angioplasty implant and graft: Secondary | ICD-10-CM | POA: Diagnosis not present

## 2017-01-05 DIAGNOSIS — R339 Retention of urine, unspecified: Secondary | ICD-10-CM | POA: Diagnosis not present

## 2017-01-05 NOTE — Discharge Instructions (Signed)
Follow up with your PCP.  Return for worsening symptoms.  Call your urologist and let them know you had another issue with your catheter.

## 2017-01-05 NOTE — ED Provider Notes (Signed)
[facility] DEPT Provider Note   CSN: [id number] Arrival date & time: 01/05/17  2200     History   Chief Complaint Chief Complaint  Patient presents with  . Penis Pain    HPI [name] [name] is a 76 y.o. male.  76 yo M with a chief complaint of penile pain. Going on for the past 3 or 4 hours. Per the family he took a shower and started having some burning to his penis. Has had issues with urinary retention. Has an indwelling Foley. They think maybe he pulled on it. Upon my entry into the room the patient at our he had a catheter exchanged. He is feeling much better. Denies any complaints currently.   The history is provided by the patient and a relative.  Penis Pain  This is a new problem. The current episode started 3 to 5 hours ago. The problem occurs constantly. The problem has been rapidly worsening. Associated symptoms include abdominal pain (suprapubic). Pertinent negatives include no chest pain, no headaches and no shortness of breath. Nothing aggravates the symptoms. Nothing relieves the symptoms. He has tried nothing for the symptoms. The treatment provided no relief.    Past Medical History:  Diagnosis Date  . Adenocarcinoma of right lung, stage 3 ([place]) 09/20/2016  . Atrial fibrillation ([place])    no hx of reported at preop visit of 04/21/11   . CAD (coronary artery disease)   . Carotid artery occlusion    left carotid endarterectomy  . Chronic kidney disease    AAA repair with reimplant of renals   . CHRONIC OBSTRUCTIVE PULMONARY DISEASE    pt denied at visit of 04/21/11   . CORONARY ARTERY DISEASE   . Dehydration 11/15/2016  . Disorder of left sacroiliac joint 08/24/2016  . DIVERTICULOSIS OF COLON   . Encounter for antineoplastic chemotherapy 10/17/2016  . GERD   . H/O hiatal hernia   . Headache(784.0)    Hx: of years of years  . HYPERTENSION   . HYPOTHYROIDISM   . JOINT EFFUSION, KNEE   . KNEE, ARTHRITIS, DEGEN./OSTEO    right knee   . Myocardial infarction  ([place])    1994   . NEPHROLITHIASIS, HX OF   . OSTEOPOROSIS   . Other dysphagia   . PERIPHERAL VASCULAR DISEASE    AAA - 1994 with reimplant of renals   . Peripheral vascular disease ([place])    subclavian stenosis PTA - 3/08   . Pure hypercholesterolemia   . Renal artery stenosis [facility])     Patient Active Problem List   Diagnosis Date Noted  . Encounter for antineoplastic immunotherapy 01/02/2017  . Goals of care, counseling/discussion 01/02/2017  . Cerebrovascular accident (CVA) due to thrombosis of right carotid artery ([place]) 12/20/2016  . Cerebrovascular accident (CVA) due to embolism of cerebral artery ([place]) 12/20/2016  . Memory loss 12/20/2016  . Dehydration 11/15/2016  . Encounter for antineoplastic chemotherapy 10/17/2016  . Malnutrition of moderate degree 10/06/2016  . Urinary tract infection 10/03/2016  . T1b N2 M0 disease (stage IIIA).adenocarcinoma of the right middle lobe of lung ([place]) 09/20/2016  . Peripheral edema   . Slow transit constipation   . Acute lower UTI   . Confusion   . SOB (shortness of breath) 09/02/2016  . Urinary incontinence   . Abnormal urinalysis   . Leukocytosis   . HCAP (healthcare-associated pneumonia)   . Hypoalbuminemia due to protein-calorie malnutrition ([place])   . Hypokalemia   . Hyponatremia   . Urinary  retention   . Sleep disturbance   . Acute bilateral deep vein thrombosis (DVT) of popliteal veins (HCC)   . Brain mass 08/31/2016  . Debility   . Benign essential HTN   . Scrotal edema   . Hyperlipidemia   . Constipation due to pain medication   . Fall from height of greater than 3 feet   . Acute blood loss anemia   . Lung mass   . Post-operative pain   . PVD (peripheral vascular disease) ([place])   . Coronary artery disease involving coronary bypass graft of native heart without angina pectoris   . PAF (paroxysmal atrial fibrillation) ([place])   . Disorder of left sacroiliac joint 08/24/2016  . Fall 08/22/2016  . Closed pelvic ring  fracture ([place])   . Carotid artery stenosis, asymptomatic 08/12/2014  . Ejection murmur 07/03/2014  . Aftercare following surgery of the circulatory system, [place] 02/11/2014  . Weakness-Abdomin and Bilateral Thigh / Leg 02/11/2014  . Carotid stenosis 07/19/2013  . Occlusion and stenosis of carotid artery without mention of cerebral infarction 07/16/2013  . Impaired fasting glucose 07/14/2013  . Knee pain 05/23/2011  . Stiffness of joint, not elsewhere classified, lower leg 05/23/2011  . Muscle weakness (generalized) 05/23/2011  . Difficulty in walking(719.7) 05/23/2011  . Bruit 11/19/2010  . AAA (abdominal aortic aneurysm) ([place]) 11/19/2010  . OTHER DYSPHAGIA 04/27/2010  . ATRIAL FIBRILLATION 12/10/2008  . JOINT EFFUSION, KNEE 01/14/2008  . KNEE PAIN 01/14/2008  . KNEE, ARTHRITIS, DEGEN./OSTEO 12/11/2007  . Hypothyroidism 10/10/2007  . Pure hypercholesterolemia 10/10/2007  . Essential hypertension 10/10/2007  . Coronary atherosclerosis 10/10/2007  . Peripheral vascular disease ([place]) 10/10/2007  . CHRONIC OBSTRUCTIVE PULMONARY DISEASE 10/10/2007  . GERD 10/10/2007  . DIVERTICULOSIS OF COLON 10/10/2007  . OSTEOPOROSIS 10/10/2007  . NEPHROLITHIASIS, HX OF 10/10/2007  . Personal history of surgery to heart and great vessels, presenting hazards to health 10/10/2007  . CHOLECYSTECTOMY, HX OF 10/10/2007  . CORONARY ARTERY BYPASS GRAFT, HX OF 10/10/2007    Past Surgical History:  Procedure Laterality Date  . ABDOMINAL AORTIC ANEURYSM REPAIR     with reimplantation of renals   . CARDIAC CATHETERIZATION     2008  . CAROTID ENDARTERECTOMY Left Jan. 30, 2015   CEA  . CHOLECYSTECTOMY  2000   Gall Bladder  . COLONOSCOPY W/ BIOPSIES AND POLYPECTOMY     Hx: of  . CORONARY ARTERY BYPASS GRAFT     1995  . CORONARY STENT PLACEMENT     Hx: of  . ENDARTERECTOMY Left 07/19/2013   Procedure: ENDARTERECTOMY CAROTID;  Surgeon: Mal Misty, MD;  Location: [facility];  Service: Vascular;   Laterality: Left;  . GALLBLADDER SURGERY  2004   . JOINT REPLACEMENT     partial knee replacement on left 2002   . KNEE SURGERY    . ORIF PELVIC FRACTURE Left 08/25/2016   Procedure: OPEN REDUCTION INTERNAL FIXATION (ORIF) PELVIC FRACTURE; plate on front, SI screw on the back;  Surgeon: Altamese Jakin, MD;  Location: [facility];  Service: Orthopedics;  Laterality: Left;  . OTHER SURGICAL HISTORY     left subclavian stenosis surgery PTA 08/2006   . OTHER SURGICAL HISTORY     carotid surgery on right 2004   . TOTAL KNEE ARTHROPLASTY  04/29/2011   Procedure: TOTAL KNEE ARTHROPLASTY;  Surgeon: Gearlean Alf;  Location: [facility];  Service: Orthopedics;  Laterality: Right;  . urinary retention    . VASCULAR SURGERY     AAA  .  VASECTOMY  1973  . VIDEO BRONCHOSCOPY WITH ENDOBRONCHIAL ULTRASOUND [name] 09/09/2016   Procedure: VIDEO BRONCHOSCOPY WITH ENDOBRONCHIAL ULTRASOUND;  Surgeon: Grace Isaac, MD;  Location: [facility];  Service: Thoracic;  Laterality: [name];       Home Medications    Prior to Admission medications   Medication Sig Start Date End Date Taking? Authorizing Provider  acetaminophen (TYLENOL) 325 MG tablet Take 325-650 mg by mouth every 6 (six) hours as needed for headache (for pain).     [provider]  ALPRAZolam Duanne Moron) 0.25 MG tablet Take 1 tablet (0.25 mg total) by mouth 3 (three) times daily as needed for anxiety. 09/16/16   Angiulli, Lavon Paganini, PA-C  bumetanide (BUMEX) 1 MG tablet Take 1 tablet (1 mg total) by mouth daily as needed (swelling). 10/07/16   Kathie Dike, MD  cephALEXin (KEFLEX) 500 MG capsule Take 1 capsule (500 mg total) by mouth 4 (four) times daily. 12/31/16   Francine Graven, DO  lansoprazole (PREVACID) 15 MG capsule Take 1 capsule (15 mg total) by mouth daily. 09/16/16   Angiulli, Lavon Paganini, PA-C  levothyroxine (SYNTHROID, LEVOTHROID) 112 MCG tablet Take 1 tablet (112 mcg total) by mouth daily. 09/16/16   Angiulli, Lavon Paganini, PA-C  linaclotide [facility]) 145  MCG CAPS capsule Take 1 capsule (145 mcg total) by mouth daily before breakfast. 12/06/16   [name], MD  magic mouthwash w/lidocaine SOLN Composition: 80 cc each of Extra St. Maalox Plus, Diphenhydramine, and Nystatin. Plus 240 cc of 2% viscous lidocaine to make 480 cc. Swish and swallow 2 tsp four times per day. 12/19/16   Tyler Pita, MD  metoprolol tartrate (LOPRESSOR) 25 MG tablet TAKE ONE-HALF TABLET BY MOUTH TWO TIMES DAILY. 12/15/16   [name], MD  niacin (SLO-NIACIN) 500 MG tablet Take 3 tablets (1,500 mg total) by mouth at bedtime. 09/16/16   Angiulli, Lavon Paganini, PA-C  nitrofurantoin, macrocrystal-monohydrate, (MACROBID) 100 MG capsule Take 1 capsule (100 mg total) by mouth 2 (two) times daily. Stop keflex 01/04/17   [name], MD  nitroGLYCERIN (NITROSTAT) 0.4 MG SL tablet Place 1 tablet (0.4 mg total) under the tongue every 5 (five) minutes as needed for chest pain. Patient not taking: Reported on 01/02/2017 09/16/16   Angiulli, Lavon Paganini, PA-C  ondansetron (ZOFRAN) 8 MG tablet Take 1 tablet (8 mg total) by mouth every 8 (eight) hours as needed for nausea or vomiting. Patient not taking: Reported on 01/02/2017 09/29/16   Curt Bears, MD  potassium chloride SA (K-DUR,KLOR-CON) 20 MEQ tablet Take 1 tablet (20 mEq total) by mouth daily. 10/20/16   [name], MD  prochlorperazine (COMPAZINE) 10 MG tablet Take 1 tablet (10 mg total) by mouth every 6 (six) hours as needed for nausea or vomiting. Patient not taking: Reported on 01/02/2017 10/17/16   Curt Bears, MD  rosuvastatin (CRESTOR) 20 MG tablet Take 1 tablet (20 mg total) by mouth daily. 09/16/16   Angiulli, Lavon Paganini, PA-C  sucralfate (CARAFATE) 1 GM/10ML suspension Take 10 mLs (1 g total) by mouth 4 (four) times daily -  with meals and at bedtime. 11/10/16   Bruning, Ashlyn, PA-C  tamsulosin (FLOMAX) 0.4 MG CAPS capsule Take 1 capsule (0.4 mg total) by mouth daily after breakfast. 09/29/16   Jamse Arn,  MD  warfarin (COUMADIN) 5 MG tablet 1-1/2 tablet every day except 2 tablets Tuesday Thursday Saturday 09/16/16   Angiulli, Lavon Paganini, PA-C    Family History Family History  Problem Relation  Age of Onset  . Hypertension Mother   . Heart disease Father   . Heart attack Father   . Diabetes Sister   . Cancer Brother 14       Lung  . Diabetes Sister     Social History Social History  Substance Use Topics  . Smoking status: Former Smoker    Types: Cigarettes    Quit date: 06/20/1992  . Smokeless tobacco: Never Used  . Alcohol use No     Allergies   Neurontin [gabapentin]; Imitrex [sumatriptan]; and Lipitor [atorvastatin]   Review of Systems Review of Systems  Constitutional: Negative for chills and fever.  HENT: Negative for congestion and facial swelling.   Eyes: Negative for discharge and visual disturbance.  Respiratory: Negative for shortness of breath.   Cardiovascular: Negative for chest pain and palpitations.  Gastrointestinal: Positive for abdominal pain (suprapubic). Negative for diarrhea and vomiting.  Genitourinary: Positive for difficulty urinating and penile pain.  Musculoskeletal: Negative for arthralgias and myalgias.  Skin: Negative for color change and rash.  Neurological: Negative for tremors, syncope and headaches.  Psychiatric/Behavioral: Negative for confusion and dysphoric mood.     Physical Exam Updated Vital Signs BP (!) 163/138   Pulse (!) 121   Temp 97.8 F (36.6 C)   Resp (!) 22   Ht 5\' 7"  (1.702 m)   Wt 61.7 kg (136 lb)   SpO2 99%   BMI 21.30 kg/m   Physical Exam  Constitutional: He is oriented to person, place, and time. He appears well-developed and well-nourished.  HENT:  Head: Normocephalic and atraumatic.  Eyes: Pupils are equal, round, and reactive to light. EOM are normal.  Neck: Normal range of motion. Neck supple. No JVD present.  Cardiovascular: Normal rate and regular rhythm.  Exam reveals no gallop and no friction rub.     No murmur heard. Pulmonary/Chest: No respiratory distress. He has no wheezes.  Abdominal: He exhibits no distension and no mass. There is no tenderness. There is no rebound and no guarding.  Genitourinary:  Genitourinary Comments: Circumcised, no erythema or purulence.  Musculoskeletal: Normal range of motion.  Neurological: He is alert and oriented to person, place, and time.  Skin: No rash noted. No pallor.  Psychiatric: He has a normal mood and affect. His behavior is normal.  Nursing note and vitals reviewed.    ED Treatments / Results  Labs (all labs ordered are listed, but only abnormal results are displayed) Labs Reviewed  URINE CULTURE    EKG  EKG Interpretation None       Radiology No results found.  Procedures Procedures (including critical care time)  Medications Ordered in ED Medications - No data to display   Initial Impression / Assessment and Plan / ED Course  I have reviewed the triage vital signs and the nursing notes.  Pertinent labs & imaging results that were available during my care of the patient were reviewed by me and considered in my medical decision making (see chart for details).     76 yo M With a chief complaint of acute urinary retention. His Foley was replaced and he has good urinary output with 800 mL of urine in his bag. He recently had an antibiotic change yesterday. For this reason I will not test his urine. I will send it for culture. Continue on his current antibiotic therapy.  11:32 PM:  I have discussed the diagnosis/risks/treatment options with the patient and family and believe the pt to be eligible for  discharge home to follow-up with PCP. We also discussed returning to the ED immediately if new or worsening sx occur. We discussed the sx which are most concerning (e.g., sudden worsening pain, fever, inability to tolerate by mouth) that necessitate immediate return. Medications administered to the patient during their visit and  any new prescriptions provided to the patient are listed below.  Medications given during this visit Medications - No data to display   The patient appears reasonably screen and/or stabilized for discharge and I doubt any other medical condition or other [facility] requiring further screening, evaluation, or treatment in the ED at this time prior to discharge.    Final Clinical Impressions(s) / ED Diagnoses   Final diagnoses:  Acute urinary retention    New Prescriptions New Prescriptions   No medications on file     Deno Etienne, DO 01/05/17 2332

## 2017-01-05 NOTE — Telephone Encounter (Signed)
Patient advised Culture back. Stop Keflex. Start Macrobid 100 mg twice a day 7 days. Patient verbalized understanding.

## 2017-01-05 NOTE — ED Triage Notes (Signed)
Pt c/o penis burning and cathter is leaking around the head of penis. Pt is not sure if the catheter has pulled out some.

## 2017-01-09 DIAGNOSIS — C8521 Mediastinal (thymic) large B-cell lymphoma, lymph nodes of head, face, and neck: Secondary | ICD-10-CM | POA: Diagnosis not present

## 2017-01-09 DIAGNOSIS — I739 Peripheral vascular disease, unspecified: Secondary | ICD-10-CM | POA: Diagnosis not present

## 2017-01-09 DIAGNOSIS — S329XXD Fracture of unspecified parts of lumbosacral spine and pelvis, subsequent encounter for fracture with routine healing: Secondary | ICD-10-CM | POA: Diagnosis not present

## 2017-01-09 DIAGNOSIS — Z466 Encounter for fitting and adjustment of urinary device: Secondary | ICD-10-CM | POA: Diagnosis not present

## 2017-01-09 DIAGNOSIS — I2510 Atherosclerotic heart disease of native coronary artery without angina pectoris: Secondary | ICD-10-CM | POA: Diagnosis not present

## 2017-01-09 DIAGNOSIS — I4891 Unspecified atrial fibrillation: Secondary | ICD-10-CM | POA: Diagnosis not present

## 2017-01-10 ENCOUNTER — Encounter: Payer: Self-pay | Admitting: Vascular Surgery

## 2017-01-10 ENCOUNTER — Telehealth: Payer: Self-pay | Admitting: Family Medicine

## 2017-01-10 ENCOUNTER — Ambulatory Visit (INDEPENDENT_AMBULATORY_CARE_PROVIDER_SITE_OTHER): Payer: Medicare Other | Admitting: Vascular Surgery

## 2017-01-10 VITALS — BP 100/59 | HR 86 | Temp 97.0°F | Resp 20 | Ht 67.0 in | Wt 139.2 lb

## 2017-01-10 DIAGNOSIS — I6529 Occlusion and stenosis of unspecified carotid artery: Secondary | ICD-10-CM

## 2017-01-10 DIAGNOSIS — I4891 Unspecified atrial fibrillation: Secondary | ICD-10-CM | POA: Diagnosis not present

## 2017-01-10 DIAGNOSIS — Z466 Encounter for fitting and adjustment of urinary device: Secondary | ICD-10-CM | POA: Diagnosis not present

## 2017-01-10 DIAGNOSIS — C8521 Mediastinal (thymic) large B-cell lymphoma, lymph nodes of head, face, and neck: Secondary | ICD-10-CM | POA: Diagnosis not present

## 2017-01-10 DIAGNOSIS — I6523 Occlusion and stenosis of bilateral carotid arteries: Secondary | ICD-10-CM | POA: Diagnosis not present

## 2017-01-10 DIAGNOSIS — S329XXD Fracture of unspecified parts of lumbosacral spine and pelvis, subsequent encounter for fracture with routine healing: Secondary | ICD-10-CM | POA: Diagnosis not present

## 2017-01-10 DIAGNOSIS — I2510 Atherosclerotic heart disease of native coronary artery without angina pectoris: Secondary | ICD-10-CM | POA: Diagnosis not present

## 2017-01-10 DIAGNOSIS — I739 Peripheral vascular disease, unspecified: Secondary | ICD-10-CM | POA: Diagnosis not present

## 2017-01-10 NOTE — Telephone Encounter (Signed)
Received a phone call from [facility] with [place] with patient's INR results. Patient INR is 1.7.Patient takes 5 MG all days except fridays he takes 7.5. Discussed with Dr.Steve Luking in real time and was told to advise patient to take 7.5 mg on tuesdays and fridays and 5 mg all other days. Recheck INR in two weeks. Verdis Frederickson verbalized understanding.

## 2017-01-10 NOTE — Progress Notes (Signed)
[facility]  Patient name: [name] [name] MRN: [id number] DOB: [birth year] Sex: male  REASON FOR VISIT: Evaluation of symptomatic right carotid stenosis, recurrent  HPI: [name] [name] is a 76 y.o. male the very complex medical history. He is status post right carotid endarterectomy with Dr. Kellie Simmering in 2005 and left carotid endarterectomy with Dr. Kellie Simmering in 2015. He had a fall from a 20 foot ladder in March of this year. Evaluation around this period of time revealed probable stroke by MRI in his right brain. CT angiogram has shown critical restenosis of his right carotid artery at its bifurcation with extreme irregularity throughout its course. He also has a great deal of calcification and his aortic arch and innominate artery but does not have any evidence of critical flow limiting stenosis. He did have a diagnosis of stage III non-small cell lung cancer at the same time as his evaluation after the fall and is been treated with one round of chemotherapy and radiation therapy and according to the patient has immunotherapy starting in August. Does have a history of coronary artery disease with prior stenting and a history of atrial fibrillation in the past. He is here today with his wife. He is returned to baseline neurologic status  Past Medical History:  Diagnosis Date  . Adenocarcinoma of right lung, stage 3 ([place]) 09/20/2016  . Atrial fibrillation ([place])    no hx of reported at preop visit of 04/21/11   . CAD (coronary artery disease)   . Carotid artery occlusion    left carotid endarterectomy  . Chronic kidney disease    AAA repair with reimplant of renals   . CHRONIC OBSTRUCTIVE PULMONARY DISEASE    pt denied at visit of 04/21/11   . CORONARY ARTERY DISEASE   . Dehydration 11/15/2016  . Disorder of left sacroiliac joint 08/24/2016  . DIVERTICULOSIS OF COLON   . Encounter for antineoplastic chemotherapy 10/17/2016  . GERD   .  H/O hiatal hernia   . Headache(784.0)    Hx: of years of years  . HYPERTENSION   . HYPOTHYROIDISM   . JOINT EFFUSION, KNEE   . KNEE, ARTHRITIS, DEGEN./OSTEO    right knee   . Myocardial infarction ([place])    1994   . NEPHROLITHIASIS, HX OF   . OSTEOPOROSIS   . Other dysphagia   . PERIPHERAL VASCULAR DISEASE    AAA - 1994 with reimplant of renals   . Peripheral vascular disease ([place])    subclavian stenosis PTA - 3/08   . Pure hypercholesterolemia   . Renal artery stenosis ([facility])     Family History  Problem Relation Age of Onset  . Hypertension Mother   . Heart disease Father   . Heart attack Father   . Diabetes Sister   . Cancer Brother 63       Lung  . Diabetes Sister     SOCIAL HISTORY: Social History  Substance Use Topics  . Smoking status: Former Smoker    Types: Cigarettes    Quit date: 06/20/1992  . Smokeless tobacco: Never Used  . Alcohol use No    Allergies  Allergen Reactions  . Neurontin [Gabapentin] Other (See Comments)    Caused confusion  . Imitrex [Sumatriptan] Nausea And Vomiting and Other (See Comments)    "made me like I was having a stoke"  . Lipitor [Atorvastatin] Nausea And Vomiting and Other (See Comments)    INTOLERANCE > MYALGIAS "couldn't get out  of the bed ( pt had to crawl out of bed ) I hurt so bad"    Current Outpatient Prescriptions  Medication Sig Dispense Refill  . acetaminophen (TYLENOL) 325 MG tablet Take 325-650 mg by mouth every 6 (six) hours as needed for headache (for pain).     [name] ALPRAZolam (XANAX) 0.25 MG tablet Take 1 tablet (0.25 mg total) by mouth 3 (three) times daily as needed for anxiety. 30 tablet 0  . bumetanide (BUMEX) 1 MG tablet Take 1 tablet (1 mg total) by mouth daily as needed (swelling). 30 tablet 1  . cephALEXin (KEFLEX) 500 MG capsule Take 1 capsule (500 mg total) by mouth 4 (four) times daily. 40 capsule 0  . lansoprazole (PREVACID) 15 MG capsule Take 1 capsule (15 mg total) by mouth daily. 30 capsule 5  .  levothyroxine (SYNTHROID, LEVOTHROID) 112 MCG tablet Take 1 tablet (112 mcg total) by mouth daily. 30 tablet 5  . linaclotide (LINZESS) 145 MCG CAPS capsule Take 1 capsule (145 mcg total) by mouth daily before breakfast. 30 capsule 11  . magic mouthwash w/lidocaine SOLN Composition: 80 cc each of Extra St. Maalox Plus, Diphenhydramine, and Nystatin. Plus 240 cc of 2% viscous lidocaine to make 480 cc. Swish and swallow 2 tsp four times per day. 480 mL 2  . metoprolol tartrate (LOPRESSOR) 25 MG tablet TAKE ONE-HALF TABLET BY MOUTH TWO TIMES DAILY. 30 tablet 5  . niacin (SLO-NIACIN) 500 MG tablet Take 3 tablets (1,500 mg total) by mouth at bedtime. 30 tablet 0  . nitrofurantoin, macrocrystal-monohydrate, (MACROBID) 100 MG capsule Take 1 capsule (100 mg total) by mouth 2 (two) times daily. Stop keflex 14 capsule 0  . nitroGLYCERIN (NITROSTAT) 0.4 MG SL tablet Place 1 tablet (0.4 mg total) under the tongue every 5 (five) minutes as needed for chest pain. 25 tablet 10  . ondansetron (ZOFRAN) 8 MG tablet Take 1 tablet (8 mg total) by mouth every 8 (eight) hours as needed for nausea or vomiting. 20 tablet 0  . potassium chloride SA (K-DUR,KLOR-CON) 20 MEQ tablet Take 1 tablet (20 mEq total) by mouth daily. 30 tablet 11  . prochlorperazine (COMPAZINE) 10 MG tablet Take 1 tablet (10 mg total) by mouth every 6 (six) hours as needed for nausea or vomiting. 30 tablet 0  . rosuvastatin (CRESTOR) 20 MG tablet Take 1 tablet (20 mg total) by mouth daily. 30 tablet 5  . sucralfate (CARAFATE) 1 GM/10ML suspension Take 10 mLs (1 g total) by mouth 4 (four) times daily -  with meals and at bedtime. 420 mL 1  . tamsulosin (FLOMAX) 0.4 MG CAPS capsule Take 1 capsule (0.4 mg total) by mouth daily after breakfast. 30 capsule 1  . warfarin (COUMADIN) 5 MG tablet 1-1/2 tablet every day except 2 tablets Tuesday Thursday Saturday 90 tablet 1   No current facility-administered medications for this visit.     REVIEW OF SYSTEMS:    [X]  denotes positive finding, [ ]  denotes negative finding Cardiac  Comments:  Chest pain or chest pressure:    Shortness of breath upon exertion: x   Short of breath when lying flat:    Irregular heart rhythm:        Vascular    Pain in calf, thigh, or hip brought on by ambulation:    Pain in feet at night that wakes you up from your sleep:     Blood clot in your veins:    Leg swelling:  PHYSICAL EXAM: Vitals:   01/10/17 1450 01/10/17 1454  BP: 112/68 (!) 100/59  Pulse: 86   Resp: 20   Temp: (!) 97 F (36.1 C)   TempSrc: Oral   SpO2: 95%   Weight: 139 lb 3.2 oz (63.1 kg)   Height: 5\' 7"  (1.702 m)     GENERAL: The patient is a well-nourished male, in no acute distress. The vital signs are documented above. CARDIOVASCULAR: Healed bilateral carotid incisions. Harsh bruit on the right carotid artery PULMONARY: There is good air exchange  MUSCULOSKELETAL: There are no major deformities or cyanosis. NEUROLOGIC: No focal weakness or paresthesias are detected. SKIN: There are no ulcers or rashes noted. PSYCHIATRIC: The patient has a normal affect.  DATA:  CT angiogram was reviewed with the patient is wife showing critical stenosis at the right carotid bifurcation from recurrent disease  MEDICAL ISSUES: Very difficult management decisions related to his recent diagnosis of stage III lung cancer. In all likelihood does have embolic stroke related to his recurrent carotid stenosis. He would be a very poor candidate for stenting related to the extreme calcification of his arch and innominate vessels making him very high risk for embolization with the procedure. From a anatomic standpoint he would be very straightforward for redo carotid endarterectomy on the right. His bifurcation is relatively low in the recurrent disease does not in extend into his internal carotid past the bifurcation. I discussed all this at length with the patient and his wife present. We'll discuss this  with Dr. Earlie Server regarding his long-term prognosis. If he is felt to have a reasonable life expectancy, would recommend right carotid endarterectomy for reduction of recurrent stroke risk    Rosetta Posner, MD FACS [facility] Tel [phone] Pager [phone]

## 2017-01-11 ENCOUNTER — Telehealth: Payer: Self-pay | Admitting: Neurology

## 2017-01-11 NOTE — Telephone Encounter (Signed)
PT left a voicemail message saying she was returning a call

## 2017-01-11 NOTE — Telephone Encounter (Signed)
No record of calling pt or leaving message.  I have nothing to relay at this time.

## 2017-01-12 DIAGNOSIS — N312 Flaccid neuropathic bladder, not elsewhere classified: Secondary | ICD-10-CM | POA: Diagnosis not present

## 2017-01-13 ENCOUNTER — Telehealth: Payer: Self-pay | Admitting: Family Medicine

## 2017-01-13 DIAGNOSIS — I4891 Unspecified atrial fibrillation: Secondary | ICD-10-CM | POA: Diagnosis not present

## 2017-01-13 DIAGNOSIS — S329XXD Fracture of unspecified parts of lumbosacral spine and pelvis, subsequent encounter for fracture with routine healing: Secondary | ICD-10-CM | POA: Diagnosis not present

## 2017-01-13 DIAGNOSIS — I739 Peripheral vascular disease, unspecified: Secondary | ICD-10-CM | POA: Diagnosis not present

## 2017-01-13 DIAGNOSIS — Z466 Encounter for fitting and adjustment of urinary device: Secondary | ICD-10-CM | POA: Diagnosis not present

## 2017-01-13 DIAGNOSIS — I2510 Atherosclerotic heart disease of native coronary artery without angina pectoris: Secondary | ICD-10-CM | POA: Diagnosis not present

## 2017-01-13 DIAGNOSIS — C8521 Mediastinal (thymic) large B-cell lymphoma, lymph nodes of head, face, and neck: Secondary | ICD-10-CM | POA: Diagnosis not present

## 2017-01-13 NOTE — Telephone Encounter (Signed)
Maria nurse with [facility] called to receive verbal orders to re-certify patient. He was seen by urologist yesterday and now has to do in and out catheters. Please advise?  We may call Verdis Frederickson at [phone]

## 2017-01-13 NOTE — Telephone Encounter (Signed)
They have already been seeing the patient for home health  Verdis Frederickson stated that they just need orders to re-instate patient care since changes have been made to his health. Periodically they have to reinstate patient to continue providing Home Health. No orders needed from [place] regarding in and out caths.

## 2017-01-13 NOTE — Telephone Encounter (Signed)
I am at a loss on what to recommend. It would be fine to reestablish his care as necessary. If he needs in and out catheters to do that as well. If she is searching for orders regarding urologic issues I highly recommend she connects with urologist we have absolutely no information

## 2017-01-16 ENCOUNTER — Telehealth: Payer: Self-pay | Admitting: Family Medicine

## 2017-01-16 DIAGNOSIS — Z466 Encounter for fitting and adjustment of urinary device: Secondary | ICD-10-CM | POA: Diagnosis not present

## 2017-01-16 DIAGNOSIS — I4891 Unspecified atrial fibrillation: Secondary | ICD-10-CM | POA: Diagnosis not present

## 2017-01-16 DIAGNOSIS — S329XXD Fracture of unspecified parts of lumbosacral spine and pelvis, subsequent encounter for fracture with routine healing: Secondary | ICD-10-CM | POA: Diagnosis not present

## 2017-01-16 DIAGNOSIS — I739 Peripheral vascular disease, unspecified: Secondary | ICD-10-CM | POA: Diagnosis not present

## 2017-01-16 DIAGNOSIS — C8521 Mediastinal (thymic) large B-cell lymphoma, lymph nodes of head, face, and neck: Secondary | ICD-10-CM | POA: Diagnosis not present

## 2017-01-16 DIAGNOSIS — I2510 Atherosclerotic heart disease of native coronary artery without angina pectoris: Secondary | ICD-10-CM | POA: Diagnosis not present

## 2017-01-16 NOTE — Telephone Encounter (Signed)
Verbal order given to Verdis Frederickson at home health to recertify patient for continued home health services.

## 2017-01-16 NOTE — Telephone Encounter (Signed)
Requesting recert order to continue home health.

## 2017-01-16 NOTE — Telephone Encounter (Signed)
Please continue home health-please provide any order that is necessary

## 2017-01-17 ENCOUNTER — Telehealth: Payer: Self-pay | Admitting: Neurology

## 2017-01-17 ENCOUNTER — Other Ambulatory Visit: Payer: Self-pay | Admitting: Medical Oncology

## 2017-01-17 DIAGNOSIS — Z9181 History of falling: Secondary | ICD-10-CM | POA: Diagnosis not present

## 2017-01-17 DIAGNOSIS — Z5181 Encounter for therapeutic drug level monitoring: Secondary | ICD-10-CM | POA: Diagnosis not present

## 2017-01-17 DIAGNOSIS — I739 Peripheral vascular disease, unspecified: Secondary | ICD-10-CM | POA: Diagnosis not present

## 2017-01-17 DIAGNOSIS — I2510 Atherosclerotic heart disease of native coronary artery without angina pectoris: Secondary | ICD-10-CM | POA: Diagnosis not present

## 2017-01-17 DIAGNOSIS — Z7901 Long term (current) use of anticoagulants: Secondary | ICD-10-CM | POA: Diagnosis not present

## 2017-01-17 DIAGNOSIS — Z466 Encounter for fitting and adjustment of urinary device: Secondary | ICD-10-CM | POA: Diagnosis not present

## 2017-01-17 DIAGNOSIS — I509 Heart failure, unspecified: Secondary | ICD-10-CM | POA: Diagnosis not present

## 2017-01-17 DIAGNOSIS — S329XXD Fracture of unspecified parts of lumbosacral spine and pelvis, subsequent encounter for fracture with routine healing: Secondary | ICD-10-CM | POA: Diagnosis not present

## 2017-01-17 DIAGNOSIS — I4891 Unspecified atrial fibrillation: Secondary | ICD-10-CM | POA: Diagnosis not present

## 2017-01-17 DIAGNOSIS — C342 Malignant neoplasm of middle lobe, bronchus or lung: Secondary | ICD-10-CM

## 2017-01-17 DIAGNOSIS — C8521 Mediastinal (thymic) large B-cell lymphoma, lymph nodes of head, face, and neck: Secondary | ICD-10-CM | POA: Diagnosis not present

## 2017-01-17 DIAGNOSIS — D649 Anemia, unspecified: Secondary | ICD-10-CM | POA: Diagnosis not present

## 2017-01-17 NOTE — Telephone Encounter (Signed)
[name] [name] is needing a Verbal order to continue Speech Therapy for Mr. Kisiel for 2x a week for the next 4 weeks. Thanks

## 2017-01-18 ENCOUNTER — Ambulatory Visit (HOSPITAL_BASED_OUTPATIENT_CLINIC_OR_DEPARTMENT_OTHER): Payer: Medicare Other

## 2017-01-18 ENCOUNTER — Other Ambulatory Visit (HOSPITAL_BASED_OUTPATIENT_CLINIC_OR_DEPARTMENT_OTHER): Payer: Medicare Other

## 2017-01-18 ENCOUNTER — Telehealth: Payer: Self-pay | Admitting: Medical Oncology

## 2017-01-18 ENCOUNTER — Telehealth: Payer: Self-pay | Admitting: Family Medicine

## 2017-01-18 VITALS — BP 122/59 | HR 71 | Temp 97.8°F | Resp 20

## 2017-01-18 DIAGNOSIS — Z5112 Encounter for antineoplastic immunotherapy: Secondary | ICD-10-CM

## 2017-01-18 DIAGNOSIS — C3411 Malignant neoplasm of upper lobe, right bronchus or lung: Secondary | ICD-10-CM

## 2017-01-18 DIAGNOSIS — C342 Malignant neoplasm of middle lobe, bronchus or lung: Secondary | ICD-10-CM

## 2017-01-18 LAB — CBC WITH DIFFERENTIAL/PLATELET
BASO%: 0.3 % (ref 0.0–2.0)
BASOS ABS: 0 10*3/uL (ref 0.0–0.1)
EOS ABS: 0 10*3/uL (ref 0.0–0.5)
EOS%: 1.2 % (ref 0.0–7.0)
HCT: 32.6 % — ABNORMAL LOW (ref 38.4–49.9)
HGB: 10.9 g/dL — ABNORMAL LOW (ref 13.0–17.1)
LYMPH%: 22.4 % (ref 14.0–49.0)
MCH: 30.7 pg (ref 27.2–33.4)
MCHC: 33.4 g/dL (ref 32.0–36.0)
MCV: 91.8 fL (ref 79.3–98.0)
MONO#: 0.4 10*3/uL (ref 0.1–0.9)
MONO%: 10.6 % (ref 0.0–14.0)
NEUT#: 2.2 10*3/uL (ref 1.5–6.5)
NEUT%: 65.5 % (ref 39.0–75.0)
Platelets: 145 10*3/uL (ref 140–400)
RBC: 3.55 10*6/uL — AB (ref 4.20–5.82)
RDW: 16.8 % — ABNORMAL HIGH (ref 11.0–14.6)
WBC: 3.3 10*3/uL — AB (ref 4.0–10.3)
lymph#: 0.7 10*3/uL — ABNORMAL LOW (ref 0.9–3.3)

## 2017-01-18 LAB — COMPREHENSIVE METABOLIC PANEL
ALT: 15 U/L (ref 0–55)
ANION GAP: 8 meq/L (ref 3–11)
AST: 22 U/L (ref 5–34)
Albumin: 3 g/dL — ABNORMAL LOW (ref 3.5–5.0)
Alkaline Phosphatase: 109 U/L (ref 40–150)
BILIRUBIN TOTAL: 0.54 mg/dL (ref 0.20–1.20)
BUN: 10.2 mg/dL (ref 7.0–26.0)
CO2: 24 meq/L (ref 22–29)
Calcium: 9.8 mg/dL (ref 8.4–10.4)
Chloride: 101 mEq/L (ref 98–109)
Creatinine: 0.7 mg/dL (ref 0.7–1.3)
GLUCOSE: 110 mg/dL (ref 70–140)
POTASSIUM: 3.9 meq/L (ref 3.5–5.1)
Sodium: 133 mEq/L — ABNORMAL LOW (ref 136–145)
Total Protein: 6.6 g/dL (ref 6.4–8.3)

## 2017-01-18 MED ORDER — SODIUM CHLORIDE 0.9 % IV SOLN
Freq: Once | INTRAVENOUS | Status: AC
  Administered 2017-01-18: 14:00:00 via INTRAVENOUS

## 2017-01-18 MED ORDER — SODIUM CHLORIDE 0.9 % IV SOLN
10.0000 mg/kg | Freq: Once | INTRAVENOUS | Status: AC
  Administered 2017-01-18: 620 mg via INTRAVENOUS
  Filled 2017-01-18: qty 10

## 2017-01-18 NOTE — Patient Instructions (Signed)
Durvalumab injection  What is this medicine?  DURVALUMAB (dur VAL ue mab) is a monoclonal antibody. It is used to treat urothelial cancer.  This medicine may be used for other purposes; ask your health care provider or pharmacist if you have questions.  COMMON BRAND NAME(S): IMFINZI  What should I tell my health care provider before I take this medicine?  They need to know if you have any of these conditions:  -diabetes  -immune system problems  -infection  -inflammatory bowel disease  -kidney disease  -liver disease  -lung or breathing disease  -lupus  -organ transplant  -stomach or intestine problems  -thyroid disease  -an unusual or allergic reaction to durvalumab, other medicines, foods, dyes, or preservatives  -pregnant or trying to get pregnant  -breast-feeding  How should I use this medicine?  This medicine is for infusion into a vein. It is given by a health care professional in a hospital or clinic setting.  A special MedGuide will be given to you before each treatment. Be sure to read this information carefully each time.  Talk to your pediatrician regarding the use of this medicine in children. Special care may be needed.  Overdosage: If you think you have taken too much of this medicine contact a poison control center or emergency room at once.  NOTE: This medicine is only for you. Do not share this medicine with others.  What if I miss a dose?  It is important not to miss your dose. Call your doctor or health care professional if you are unable to keep an appointment.  What may interact with this medicine?  Interactions have not been studied.  This list may not describe all possible interactions. Give your health care provider a list of all the medicines, herbs, non-prescription drugs, or dietary supplements you use. Also tell them if you smoke, drink alcohol, or use illegal drugs. Some items may interact with your medicine.  What should I watch for while using this medicine?  This drug may make you  feel generally unwell. Continue your course of treatment even though you feel ill unless your doctor tells you to stop.  You may need blood work done while you are taking this medicine.  Do not become pregnant while taking this medicine or for 3 months after stopping it. Women should inform their doctor if they wish to become pregnant or think they might be pregnant. There is a potential for serious side effects to an unborn child. Talk to your health care professional or pharmacist for more information. Do not breast-feed an infant while taking this medicine or for 3 months after stopping it.  What side effects may I notice from receiving this medicine?  Side effects that you should report to your doctor or health care professional as soon as possible:  -allergic reactions like skin rash, itching or hives, swelling of the face, lips, or tongue  -black, tarry stools  -bloody or watery diarrhea  -breathing problems  -change in emotions or moods  -change in sex drive  -changes in vision  -chest pain or chest tightness  -chills  -confusion  -cough  -facial flushing  -fever  -headache  -signs and symptoms of high blood sugar such as dizziness; dry mouth; dry skin; fruity breath; nausea; stomach pain; increased hunger or thirst; increased urination  -signs and symptoms of liver injury like dark yellow or brown urine; general ill feeling or flu-like symptoms; light-colored stools; loss of appetite; nausea; right upper belly pain;   unusually weak or tired; yellowing of the eyes or skin  -stomach pain  -trouble passing urine or change in the amount of urine  -weight gain or weight loss  Side effects that usually do not require medical attention (report these to your doctor or health care professional if they continue or are bothersome):  -bone pain  -constipation  -loss of appetite  -muscle pain  -nausea  -swelling of the ankles, feet, hands  -tiredness  This list may not describe all possible side effects. Call your doctor  for medical advice about side effects. You may report side effects to FDA at 1-800-FDA-1088.  Where should I keep my medicine?  This drug is given in a hospital or clinic and will not be stored at home.  NOTE: This sheet is a summary. It may not cover all possible information. If you have questions about this medicine, talk to your doctor, pharmacist, or health care provider.  © 2018 [employer]/Gold Standard (2016-01-08 15:50:36)

## 2017-01-18 NOTE — Telephone Encounter (Signed)
I left message for pt to return immunotherapy f/u call

## 2017-01-18 NOTE — Telephone Encounter (Signed)
Please sign & date home health orders   In red folder in yellow box & forward to Brendale to be faxed back

## 2017-01-18 NOTE — Telephone Encounter (Signed)
Orders were signed please forward accordingly

## 2017-01-19 ENCOUNTER — Telehealth: Payer: Self-pay | Admitting: Vascular Surgery

## 2017-01-19 DIAGNOSIS — D649 Anemia, unspecified: Secondary | ICD-10-CM | POA: Diagnosis not present

## 2017-01-19 DIAGNOSIS — C8521 Mediastinal (thymic) large B-cell lymphoma, lymph nodes of head, face, and neck: Secondary | ICD-10-CM | POA: Diagnosis not present

## 2017-01-19 DIAGNOSIS — I4891 Unspecified atrial fibrillation: Secondary | ICD-10-CM | POA: Diagnosis not present

## 2017-01-19 DIAGNOSIS — Z466 Encounter for fitting and adjustment of urinary device: Secondary | ICD-10-CM | POA: Diagnosis not present

## 2017-01-19 DIAGNOSIS — I2510 Atherosclerotic heart disease of native coronary artery without angina pectoris: Secondary | ICD-10-CM | POA: Diagnosis not present

## 2017-01-19 DIAGNOSIS — I739 Peripheral vascular disease, unspecified: Secondary | ICD-10-CM | POA: Diagnosis not present

## 2017-01-19 NOTE — Telephone Encounter (Signed)
I discussed patient's care and options with Dr. Julien Nordmann. He does have stage III lung cancer with an estimated survival in the 20-25 month range. Has tolerated his initial chemotherapy and is now on immunotherapy. Patient has multiple other medical issues with a very debilitated state. From my standpoint option are maximal medical therapy versus redo right carotid endarterectomy. Feel most appropriate is to allow him to continue to recover from his stroke and become adjusted on his chemotherapy. Determine his functional status in several months to determine if the endarterectomy is appropriate. If he has recurrent symptoms, would proceed with endarterectomy. Attempted to discuss this with him by telephone today. Unsuccessful. Will continue and will see him back in the office in 3 months. He has follow-up with Dr. Delice Lesch for neurology in 2 months.

## 2017-01-19 NOTE — Telephone Encounter (Signed)
I spoke to [name] [name] and discussed Dr. Luther Parody plan to see patient in 3 months here in the office. I told her that Dr. Julien Nordmann was in agreement with this plan. I will have our appointment secretary call her to set up an office visit for the end of October.

## 2017-01-19 NOTE — Telephone Encounter (Signed)
Faxed orders

## 2017-01-20 ENCOUNTER — Ambulatory Visit: Payer: Medicare Other | Admitting: Vascular Surgery

## 2017-01-20 NOTE — Telephone Encounter (Signed)
Sched appt 02/16/17 at 11:45. Lm on wife's #.

## 2017-01-23 ENCOUNTER — Telehealth: Payer: Self-pay | Admitting: *Deleted

## 2017-01-23 DIAGNOSIS — I739 Peripheral vascular disease, unspecified: Secondary | ICD-10-CM | POA: Diagnosis not present

## 2017-01-23 DIAGNOSIS — D649 Anemia, unspecified: Secondary | ICD-10-CM | POA: Diagnosis not present

## 2017-01-23 DIAGNOSIS — Z466 Encounter for fitting and adjustment of urinary device: Secondary | ICD-10-CM | POA: Diagnosis not present

## 2017-01-23 DIAGNOSIS — I2510 Atherosclerotic heart disease of native coronary artery without angina pectoris: Secondary | ICD-10-CM | POA: Diagnosis not present

## 2017-01-23 DIAGNOSIS — I4891 Unspecified atrial fibrillation: Secondary | ICD-10-CM | POA: Diagnosis not present

## 2017-01-23 DIAGNOSIS — C8521 Mediastinal (thymic) large B-cell lymphoma, lymph nodes of head, face, and neck: Secondary | ICD-10-CM | POA: Diagnosis not present

## 2017-01-23 NOTE — Telephone Encounter (Signed)
[name] from [facility] calling to report INR. Today 3.0. Takes 5mg  daily except on Tuesday and fridays takes 7.5mg . Consult with dr Richardson Landry change to 5mg  every day except 7.5mg  on Friday. Recheck INR in 2 weeks. [name] [name] notified.   bp was low left arm 80/40 right arm 88/40 this am. Pt not drinking much this weekend. Verdis Frederickson talked with him about drinking more fluids. Feeling fair. Some cognitive issues. Takes metoprolol 25mg  one half bid (12.5mg )   [facility] nurse [phone]

## 2017-01-23 NOTE — Telephone Encounter (Signed)
Discussed with [name] [name] at home health and Kyaire's wife [name]

## 2017-01-23 NOTE — Telephone Encounter (Signed)
BP has been good last several visits, liberalize fluid intake, ck b p later ths wk and notify us if syst remains less than 100

## 2017-01-24 ENCOUNTER — Ambulatory Visit
Admission: RE | Admit: 2017-01-24 | Discharge: 2017-01-24 | Disposition: A | Discharge status: Home / Self Care | Payer: Medicare Other | Source: Ambulatory Visit | Attending: Urology | Admitting: Urology

## 2017-01-24 ENCOUNTER — Encounter: Payer: Self-pay | Admitting: Urology

## 2017-01-24 VITALS — BP 106/43 | HR 82 | Temp 98.2°F | Resp 16 | Ht 67.0 in | Wt 135.4 lb

## 2017-01-24 DIAGNOSIS — C342 Malignant neoplasm of middle lobe, bronchus or lung: Secondary | ICD-10-CM | POA: Insufficient documentation

## 2017-01-24 DIAGNOSIS — Z8673 Personal history of transient ischemic attack (TIA), and cerebral infarction without residual deficits: Secondary | ICD-10-CM | POA: Insufficient documentation

## 2017-01-24 DIAGNOSIS — D649 Anemia, unspecified: Secondary | ICD-10-CM | POA: Diagnosis not present

## 2017-01-24 DIAGNOSIS — Z923 Personal history of irradiation: Secondary | ICD-10-CM | POA: Insufficient documentation

## 2017-01-24 DIAGNOSIS — Z466 Encounter for fitting and adjustment of urinary device: Secondary | ICD-10-CM | POA: Diagnosis not present

## 2017-01-24 DIAGNOSIS — Z9221 Personal history of antineoplastic chemotherapy: Secondary | ICD-10-CM | POA: Diagnosis not present

## 2017-01-24 DIAGNOSIS — R599 Enlarged lymph nodes, unspecified: Secondary | ICD-10-CM | POA: Diagnosis not present

## 2017-01-24 DIAGNOSIS — C8521 Mediastinal (thymic) large B-cell lymphoma, lymph nodes of head, face, and neck: Secondary | ICD-10-CM | POA: Diagnosis not present

## 2017-01-24 DIAGNOSIS — I4891 Unspecified atrial fibrillation: Secondary | ICD-10-CM | POA: Diagnosis not present

## 2017-01-24 DIAGNOSIS — I739 Peripheral vascular disease, unspecified: Secondary | ICD-10-CM | POA: Diagnosis not present

## 2017-01-24 DIAGNOSIS — I2510 Atherosclerotic heart disease of native coronary artery without angina pectoris: Secondary | ICD-10-CM | POA: Diagnosis not present

## 2017-01-24 NOTE — Progress Notes (Signed)
Radiation Oncology         [phone] ________________________________  Name: [name] [name] MRN: [id number]  Date: 01/24/2017  DOB: 10-Jan-1941  Post Treatment Note  CC: [name], MD  Jamse Arn, MD  Diagnosis:  76 y.o. gentleman with  Stage IIIA(T1bN2M0) adenocarcinoma of the right middle lobe of lung, positive PDL1 expression 50%  Interval Since Last Radiation:  6 weeks of concurrent chemoradiation 10/24/16 - 12/08/16:  The primary right lung tumor and involved mediastinal adenopathy were treated to 66 Gy in 33 fractions of 2 Gy.  Narrative:  The patient returns today for routine follow-up. He tolerated radiation treatment relatively well. He did experience odynophagia which was managed with Carafate, magic mouthwash and eating soft foods. He developed hyperpigmantion without desquamation of the right upper back. He lost some weight and had an occasional productive cough but denied hemoptysis. He did experience modest fatigue.                          He has completed his course of concurrent chemoradiation with weekly carboplatin and paclitaxel (first dose of chemotherapy 10/17/2016. Status post 8 cycles. Last dose was given 12/05/2016 with partial response).  He had a follow up CT Chest on 11/30/16 which demonstrated a good treatment response with decreased size of the RML mass which measured 1.6 x 1.4 cm, previously 1.9 x 2.0 cm as well as decreased size of the hilar and subcarinal nodes that were treated.  He has recently started consolidation treatment with immunotherapy with Imfinzi (Durvalumab) 10 MG/KG every 2 weeks- first dose 01/18/2017.  On review of systems, the patient states that he is doing well in general.  He has had complete resolution of the esophagitis at this point. He is no longer using the magic mouthwash but reports that this "did the trick".  He denies hemoptysis, chest pain, increased shortness of breath, fever or chills.  He reports a healthy appetite  and is eating well to maintain his weight.  He has had a few episodes of hypotension recently which appear to be related to dehydration and resolve with increased fluid consumption. He denies abdominal pain, diarrhea or constipation. He has completed one infusion of his immunotherapy and reports that he tolerated this well aside from some mild diarrhea 1 day after infusion.  ALLERGIES:  is allergic to neurontin [gabapentin]; imitrex [sumatriptan]; and lipitor [atorvastatin].  Meds: Current Outpatient Prescriptions  Medication Sig Dispense Refill  . acetaminophen (TYLENOL) 325 MG tablet Take 325-650 mg by mouth every 6 (six) hours as needed for headache (for pain).     . bumetanide (BUMEX) 1 MG tablet Take 1 tablet (1 mg total) by mouth daily as needed (swelling). 30 tablet 1  . lansoprazole (PREVACID) 15 MG capsule Take 1 capsule (15 mg total) by mouth daily. 30 capsule 5  . levothyroxine (SYNTHROID, LEVOTHROID) 112 MCG tablet Take 1 tablet (112 mcg total) by mouth daily. 30 tablet 5  . linaclotide (LINZESS) 145 MCG CAPS capsule Take 1 capsule (145 mcg total) by mouth daily before breakfast. 30 capsule 11  . magic mouthwash w/lidocaine SOLN Composition: 80 cc each of Extra St. Maalox Plus, Diphenhydramine, and Nystatin. Plus 240 cc of 2% viscous lidocaine to make 480 cc. Swish and swallow 2 tsp four times per day. 480 mL 2  . metoprolol tartrate (LOPRESSOR) 25 MG tablet TAKE ONE-HALF TABLET BY MOUTH TWO TIMES DAILY. 30 tablet 5  . niacin (SLO-NIACIN) 500  MG tablet Take 3 tablets (1,500 mg total) by mouth at bedtime. 30 tablet 0  . potassium chloride SA (K-DUR,KLOR-CON) 20 MEQ tablet Take 1 tablet (20 mEq total) by mouth daily. 30 tablet 11  . rosuvastatin (CRESTOR) 20 MG tablet Take 1 tablet (20 mg total) by mouth daily. 30 tablet 5  . tamsulosin (FLOMAX) 0.4 MG CAPS capsule Take 1 capsule (0.4 mg total) by mouth daily after breakfast. 30 capsule 1  . warfarin (COUMADIN) 5 MG tablet 1-1/2 tablet  every day except 2 tablets Tuesday Thursday Saturday 90 tablet 1  . ALPRAZolam (XANAX) 0.25 MG tablet Take 1 tablet (0.25 mg total) by mouth 3 (three) times daily as needed for anxiety. (Patient not taking: Reported on 01/24/2017) 30 tablet 0  . nitroGLYCERIN (NITROSTAT) 0.4 MG SL tablet Place 1 tablet (0.4 mg total) under the tongue every 5 (five) minutes as needed for chest pain. (Patient not taking: Reported on 01/24/2017) 25 tablet 10  . ondansetron (ZOFRAN) 8 MG tablet Take 1 tablet (8 mg total) by mouth every 8 (eight) hours as needed for nausea or vomiting. (Patient not taking: Reported on 01/24/2017) 20 tablet 0  . prochlorperazine (COMPAZINE) 10 MG tablet Take 1 tablet (10 mg total) by mouth every 6 (six) hours as needed for nausea or vomiting. (Patient not taking: Reported on 01/24/2017) 30 tablet 0  . sucralfate (CARAFATE) 1 GM/10ML suspension Take 10 mLs (1 g total) by mouth 4 (four) times daily -  with meals and at bedtime. (Patient not taking: Reported on 01/24/2017) 420 mL 1   No current facility-administered medications for this encounter.     Physical Findings:  height is '5\' 7"'  (1.702 m) and weight is 135 lb 6.4 oz (61.4 kg). His oral temperature is 98.2 F (36.8 C). His blood pressure is 106/43 (abnormal) and his pulse is 82. His respiration is 16 and oxygen saturation is 95%.  Pain Assessment Pain Score: 0-No pain/10 In general this is a well appearing caucasian male in no acute distress. He's alert and oriented x4 and appropriate throughout the examination. Cardiopulmonary assessment is negative for acute distress and he exhibits normal effort.   Lab Findings: Lab Results  Component Value Date   WBC 3.3 (L) 01/18/2017   HGB 10.9 (L) 01/18/2017   HCT 32.6 (L) 01/18/2017   MCV 91.8 01/18/2017   PLT 145 01/18/2017     Radiographic Findings: Ct Angio Head W Or Wo Contrast  Result Date: 12/30/2016 CLINICAL DATA:  Recent stroke right MCA territory. History of bilateral carotid  endarterectomy. Lung cancer EXAM: CT ANGIOGRAPHY HEAD AND NECK TECHNIQUE: Multidetector CT imaging of the head and neck was performed using the standard protocol during bolus administration of intravenous contrast. Multiplanar CT image reconstructions and MIPs were obtained to evaluate the vascular anatomy. Carotid stenosis measurements (when applicable) are obtained utilizing NASCET criteria, using the distal internal carotid diameter as the denominator. CONTRAST:  31m ISOVUE-300 IOPAMIDOL (ISOVUE-300) INJECTION 61% COMPARISON:  MRI head 10/15/2016 FINDINGS: CT HEAD FINDINGS Brain: Moderate atrophy. Chronic ischemic changes in the white matter bilaterally. Chronic infarcts in the frontal lobes bilaterally. Negative for acute infarct, hemorrhage, or mass lesion. Vascular: Atherosclerotic calcification in the carotid artery bilaterally. Negative for hyperdense vessel Skull: Negative Sinuses: Negative Orbits: Negative Review of the MIP images confirms the above findings CTA NECK FINDINGS Aortic arch: Extensive atherosclerotic calcification in the aortic arch. Right carotid system: Mild stenosis of the proximal right common carotid artery. Carotid endarterectomy extends proximally into the common  carotid artery and is patulous. There is a severe stenosis of the distal common carotid artery measuring approximately 80% diameter stenosis. The plaque is irregular with linear filling defect and ulceration. Possible linear thrombus vs plaque. External carotid artery is moderately narrowed at the origin. Left carotid system: Stenting of the left subclavian artery which is patent. Extensive calcification proximal left common carotid artery with long Segment 50%- 60% diameter stenosis. Left carotid endarterectomy graft extends proximally in the common carotid artery and is widely patent. Left carotid bifurcation patent. No significant internal carotid artery stenosis on the left. Vertebral arteries: Mild stenosis origin right  vertebral artery. Scattered atherosclerotic disease in the right vertebral artery. Left vertebral artery patent without significant stenosis. Skeleton: No acute abnormality. Other neck: Negative Upper chest: Negative Review of the MIP images confirms the above findings CTA HEAD FINDINGS Anterior circulation: Atherosclerotic calcification in the cavernous carotid bilaterally without significant stenosis. Anterior and middle cerebral artery patent without significant stenosis. Posterior circulation: Both vertebral arteries widely patent to the basilar. Basilar widely patent. Left PICA patent. Right PICA not visualized. Bilateral AICA, superior cerebellar, and posterior cerebral artery patent without significant stenosis. Venous sinuses: Negative Anatomic variants: Negative Delayed phase: No enhancing intracranial lesion is seen postcontrast administration. Review of the MIP images confirms the above findings IMPRESSION: Right carotid endarterectomy. 80% diameter stenosis distal right common carotid artery. Irregular plaque with ulceration possible thrombus. Right internal carotid artery is patent. Moderate narrowing of the right external carotid artery. Left carotid endarterectomy patent without significant stenosis. There is extensive atherosclerotic disease left common carotid artery which is heavily calcified with 50-60% diameter long segment stenosis. Both vertebral arteries widely patent No significant intracranial stenosis or large vessel occlusion. Electronically Signed   By: Franchot Gallo M.D.   On: 12/30/2016 13:52   Ct Angio Neck W Or Wo Contrast  Result Date: 12/30/2016 CLINICAL DATA:  Recent stroke right MCA territory. History of bilateral carotid endarterectomy. Lung cancer EXAM: CT ANGIOGRAPHY HEAD AND NECK TECHNIQUE: Multidetector CT imaging of the head and neck was performed using the standard protocol during bolus administration of intravenous contrast. Multiplanar CT image reconstructions and  MIPs were obtained to evaluate the vascular anatomy. Carotid stenosis measurements (when applicable) are obtained utilizing NASCET criteria, using the distal internal carotid diameter as the denominator. CONTRAST:  72m ISOVUE-300 IOPAMIDOL (ISOVUE-300) INJECTION 61% COMPARISON:  MRI head 10/15/2016 FINDINGS: CT HEAD FINDINGS Brain: Moderate atrophy. Chronic ischemic changes in the white matter bilaterally. Chronic infarcts in the frontal lobes bilaterally. Negative for acute infarct, hemorrhage, or mass lesion. Vascular: Atherosclerotic calcification in the carotid artery bilaterally. Negative for hyperdense vessel Skull: Negative Sinuses: Negative Orbits: Negative Review of the MIP images confirms the above findings CTA NECK FINDINGS Aortic arch: Extensive atherosclerotic calcification in the aortic arch. Right carotid system: Mild stenosis of the proximal right common carotid artery. Carotid endarterectomy extends proximally into the common carotid artery and is patulous. There is a severe stenosis of the distal common carotid artery measuring approximately 80% diameter stenosis. The plaque is irregular with linear filling defect and ulceration. Possible linear thrombus vs plaque. External carotid artery is moderately narrowed at the origin. Left carotid system: Stenting of the left subclavian artery which is patent. Extensive calcification proximal left common carotid artery with long Segment 50%- 60% diameter stenosis. Left carotid endarterectomy graft extends proximally in the common carotid artery and is widely patent. Left carotid bifurcation patent. No significant internal carotid artery stenosis on the left. Vertebral arteries: Mild stenosis  origin right vertebral artery. Scattered atherosclerotic disease in the right vertebral artery. Left vertebral artery patent without significant stenosis. Skeleton: No acute abnormality. Other neck: Negative Upper chest: Negative Review of the MIP images confirms the  above findings CTA HEAD FINDINGS Anterior circulation: Atherosclerotic calcification in the cavernous carotid bilaterally without significant stenosis. Anterior and middle cerebral artery patent without significant stenosis. Posterior circulation: Both vertebral arteries widely patent to the basilar. Basilar widely patent. Left PICA patent. Right PICA not visualized. Bilateral AICA, superior cerebellar, and posterior cerebral artery patent without significant stenosis. Venous sinuses: Negative Anatomic variants: Negative Delayed phase: No enhancing intracranial lesion is seen postcontrast administration. Review of the MIP images confirms the above findings IMPRESSION: Right carotid endarterectomy. 80% diameter stenosis distal right common carotid artery. Irregular plaque with ulceration possible thrombus. Right internal carotid artery is patent. Moderate narrowing of the right external carotid artery. Left carotid endarterectomy patent without significant stenosis. There is extensive atherosclerotic disease left common carotid artery which is heavily calcified with 50-60% diameter long segment stenosis. Both vertebral arteries widely patent No significant intracranial stenosis or large vessel occlusion. Electronically Signed   By: Franchot Gallo M.D.   On: 12/30/2016 13:52   Ct Chest W Contrast  Result Date: 12/30/2016 CLINICAL DATA:  Right middle lobe lung cancer on restaging EXAM: CT CHEST WITH CONTRAST TECHNIQUE: Multidetector CT imaging of the chest was performed during intravenous contrast administration. CONTRAST:  22m ISOVUE-300 IOPAMIDOL (ISOVUE-300) INJECTION 61% COMPARISON:  PET-CT dated 09/29/2016 FINDINGS: Cardiovascular: Heart is normal in size.  No pericardial effusion. Three vessel coronary atherosclerosis. Postsurgical changes related to prior CABG. No evidence of thoracic aortic aneurysm. Atherosclerotic calcifications of the aortic arch. Left subclavian artery stent. Mediastinum/Nodes: 8 mm  short axis subcarinal node (series 3/ image 72), previously 18 mm. 7 mm short axis right hilar node (series 3/ image 9), poorly visualized on prior PET. Visualized thyroid is unremarkable. Lungs/Pleura: 1.6 x 1.4 cm spiculated right middle lobe nodule (series 6/ image 87), previously 1.9 x 2.0 cm. Surrounding radiation changes. Mild paraseptal emphysematous changes, upper lobe predominant. Mild subpleural reticulation/fibrosis in the lungs bilaterally. No focal consolidation. No pleural effusion or pneumothorax. Upper Abdomen: Visualized upper abdomen is unremarkable, noting cholecystectomy clips. Musculoskeletal: Mild superior endplate compression fracture deformity at T6. Mild inferior endplate compression fracture deformity at T8. These are both new from prior CT chest dated 08/22/2016. Median sternotomy. IMPRESSION: 1.6 x 1.4 cm spiculated right middle lobe nodule, corresponding to known primary bronchogenic neoplasm, decreased. Surrounding radiation changes. Small subcarinal and right hilar lymph nodes measuring up to 8 mm short axis, possibly reflecting nodal metastases. Mild compression fracture deformities involving T6 and T8. Aortic Atherosclerosis (ICD10-I70.0) and Emphysema (ICD10-J43.9). Electronically Signed   By: SJulian HyM.D.   On: 12/30/2016 14:29    Impression/Plan: 1. 76y.o. gentleman with  Stage IIIA(T1bN2M0) adenocarcinoma of the right middle lobe of lung, positive PDL1 expression 50% The patient appears to be doing well following completion of radiotherapy. Recent CT scan of the chest with contrast demonstrated a good treatment response with decreased size of the RML mass which measured 1.6 x 1.4 cm, previously 1.9 x 2.0 cm as well as decreased size of the hilar and subcarinal nodes that were treated. He will continue with consolidation treatment with Imfiinzi under the care and direction of Dr. MMckinley Jewelwith plans for continued surveillance imaging with CT Chest every 3-6 months  for the first 3 years and then q 6 months for an additional 2  years.  At this point, we will continue to follow him closely via correspondence with medical oncology and are happy to continue to participate in his care if clinically indicated but will plan for follow up as needed.  He is comfortable with this plan and knows to call at anytime with any questions or concerns related to his previous radiotherapy.   Nicholos Johns, PA-C

## 2017-01-25 ENCOUNTER — Telehealth: Payer: Self-pay | Admitting: *Deleted

## 2017-01-25 DIAGNOSIS — Z466 Encounter for fitting and adjustment of urinary device: Secondary | ICD-10-CM | POA: Diagnosis not present

## 2017-01-25 DIAGNOSIS — D649 Anemia, unspecified: Secondary | ICD-10-CM | POA: Diagnosis not present

## 2017-01-25 DIAGNOSIS — I739 Peripheral vascular disease, unspecified: Secondary | ICD-10-CM | POA: Diagnosis not present

## 2017-01-25 DIAGNOSIS — I2510 Atherosclerotic heart disease of native coronary artery without angina pectoris: Secondary | ICD-10-CM | POA: Diagnosis not present

## 2017-01-25 DIAGNOSIS — I4891 Unspecified atrial fibrillation: Secondary | ICD-10-CM | POA: Diagnosis not present

## 2017-01-25 DIAGNOSIS — C8521 Mediastinal (thymic) large B-cell lymphoma, lymph nodes of head, face, and neck: Secondary | ICD-10-CM | POA: Diagnosis not present

## 2017-01-25 NOTE — Telephone Encounter (Signed)
Tammy nurse from [facility] calling to report bp readings. On 8/6 nurse called and bp was 80/40. Today 105/45 left arm and right arm 108/48. Pt is feeling fine. Consult with dr Richardson Landry. bp much better. Continue the same. Tammy at [facility] notified.

## 2017-01-26 DIAGNOSIS — I2510 Atherosclerotic heart disease of native coronary artery without angina pectoris: Secondary | ICD-10-CM | POA: Diagnosis not present

## 2017-01-26 DIAGNOSIS — C8521 Mediastinal (thymic) large B-cell lymphoma, lymph nodes of head, face, and neck: Secondary | ICD-10-CM | POA: Diagnosis not present

## 2017-01-26 DIAGNOSIS — I739 Peripheral vascular disease, unspecified: Secondary | ICD-10-CM | POA: Diagnosis not present

## 2017-01-26 DIAGNOSIS — Z466 Encounter for fitting and adjustment of urinary device: Secondary | ICD-10-CM | POA: Diagnosis not present

## 2017-01-26 DIAGNOSIS — D649 Anemia, unspecified: Secondary | ICD-10-CM | POA: Diagnosis not present

## 2017-01-26 DIAGNOSIS — I4891 Unspecified atrial fibrillation: Secondary | ICD-10-CM | POA: Diagnosis not present

## 2017-01-31 DIAGNOSIS — Z466 Encounter for fitting and adjustment of urinary device: Secondary | ICD-10-CM | POA: Diagnosis not present

## 2017-01-31 DIAGNOSIS — I2510 Atherosclerotic heart disease of native coronary artery without angina pectoris: Secondary | ICD-10-CM | POA: Diagnosis not present

## 2017-01-31 DIAGNOSIS — I4891 Unspecified atrial fibrillation: Secondary | ICD-10-CM | POA: Diagnosis not present

## 2017-01-31 DIAGNOSIS — D649 Anemia, unspecified: Secondary | ICD-10-CM | POA: Diagnosis not present

## 2017-01-31 DIAGNOSIS — C8521 Mediastinal (thymic) large B-cell lymphoma, lymph nodes of head, face, and neck: Secondary | ICD-10-CM | POA: Diagnosis not present

## 2017-01-31 DIAGNOSIS — I739 Peripheral vascular disease, unspecified: Secondary | ICD-10-CM | POA: Diagnosis not present

## 2017-02-01 ENCOUNTER — Other Ambulatory Visit: Payer: Self-pay | Admitting: *Deleted

## 2017-02-01 ENCOUNTER — Ambulatory Visit (HOSPITAL_BASED_OUTPATIENT_CLINIC_OR_DEPARTMENT_OTHER): Payer: Medicare Other

## 2017-02-01 ENCOUNTER — Ambulatory Visit (HOSPITAL_BASED_OUTPATIENT_CLINIC_OR_DEPARTMENT_OTHER): Payer: Medicare Other | Admitting: Internal Medicine

## 2017-02-01 ENCOUNTER — Other Ambulatory Visit (HOSPITAL_BASED_OUTPATIENT_CLINIC_OR_DEPARTMENT_OTHER): Payer: Medicare Other

## 2017-02-01 ENCOUNTER — Encounter: Payer: Self-pay | Admitting: Internal Medicine

## 2017-02-01 ENCOUNTER — Telehealth: Payer: Self-pay | Admitting: Internal Medicine

## 2017-02-01 VITALS — BP 114/54 | HR 70 | Temp 97.6°F | Resp 18 | Ht 67.0 in | Wt 133.9 lb

## 2017-02-01 DIAGNOSIS — Z5111 Encounter for antineoplastic chemotherapy: Secondary | ICD-10-CM | POA: Diagnosis present

## 2017-02-01 DIAGNOSIS — C3411 Malignant neoplasm of upper lobe, right bronchus or lung: Secondary | ICD-10-CM

## 2017-02-01 DIAGNOSIS — C342 Malignant neoplasm of middle lobe, bronchus or lung: Secondary | ICD-10-CM

## 2017-02-01 DIAGNOSIS — Z5112 Encounter for antineoplastic immunotherapy: Secondary | ICD-10-CM

## 2017-02-01 DIAGNOSIS — Z79899 Other long term (current) drug therapy: Secondary | ICD-10-CM | POA: Diagnosis not present

## 2017-02-01 LAB — COMPREHENSIVE METABOLIC PANEL
ALK PHOS: 105 U/L (ref 40–150)
ALT: 15 U/L (ref 0–55)
ANION GAP: 8 meq/L (ref 3–11)
AST: 21 U/L (ref 5–34)
Albumin: 2.6 g/dL — ABNORMAL LOW (ref 3.5–5.0)
BUN: 12.8 mg/dL (ref 7.0–26.0)
CALCIUM: 9.5 mg/dL (ref 8.4–10.4)
CHLORIDE: 102 meq/L (ref 98–109)
CO2: 23 mEq/L (ref 22–29)
CREATININE: 0.7 mg/dL (ref 0.7–1.3)
GLUCOSE: 115 mg/dL (ref 70–140)
Potassium: 4.1 mEq/L (ref 3.5–5.1)
Sodium: 133 mEq/L — ABNORMAL LOW (ref 136–145)
TOTAL PROTEIN: 6.2 g/dL — AB (ref 6.4–8.3)
Total Bilirubin: 0.51 mg/dL (ref 0.20–1.20)

## 2017-02-01 LAB — CBC WITH DIFFERENTIAL/PLATELET
BASO%: 1.5 % (ref 0.0–2.0)
Basophils Absolute: 0.1 10*3/uL (ref 0.0–0.1)
EOS ABS: 0.1 10*3/uL (ref 0.0–0.5)
EOS%: 1.1 % (ref 0.0–7.0)
HCT: 31.7 % — ABNORMAL LOW (ref 38.4–49.9)
HGB: 10.8 g/dL — ABNORMAL LOW (ref 13.0–17.1)
LYMPH%: 10.6 % — AB (ref 14.0–49.0)
MCH: 30.6 pg (ref 27.2–33.4)
MCHC: 33.9 g/dL (ref 32.0–36.0)
MCV: 90.1 fL (ref 79.3–98.0)
MONO#: 0.3 10*3/uL (ref 0.1–0.9)
MONO%: 6.2 % (ref 0.0–14.0)
NEUT#: 4.1 10*3/uL (ref 1.5–6.5)
NEUT%: 80.6 % — AB (ref 39.0–75.0)
PLATELETS: 255 10*3/uL (ref 140–400)
RBC: 3.52 10*6/uL — AB (ref 4.20–5.82)
RDW: 16 % — ABNORMAL HIGH (ref 11.0–14.6)
WBC: 5.1 10*3/uL (ref 4.0–10.3)
lymph#: 0.5 10*3/uL — ABNORMAL LOW (ref 0.9–3.3)

## 2017-02-01 LAB — TSH: TSH: 0.369 m(IU)/L (ref 0.320–4.118)

## 2017-02-01 MED ORDER — SODIUM CHLORIDE 0.9 % IV SOLN
Freq: Once | INTRAVENOUS | Status: AC
  Administered 2017-02-01: 11:00:00 via INTRAVENOUS

## 2017-02-01 MED ORDER — SODIUM CHLORIDE 0.9 % IV SOLN
10.0000 mg/kg | Freq: Once | INTRAVENOUS | Status: AC
  Administered 2017-02-01: 620 mg via INTRAVENOUS
  Filled 2017-02-01: qty 2.4

## 2017-02-01 NOTE — Progress Notes (Signed)
[facility] [phone]   [phone]  OFFICE PROGRESS NOTE  [name], MD [address] [place] [address]  DIAGNOSIS: Stage IIIA (T1b, N2, M0) non-small cell lung cancer, adenocarcinoma with positive PDL 1 expression 50% diagnosed in March 2018 and presented with right upper lobe lung nodule in addition to mediastinal lymphadenopathy.  PRIOR THERAPY: Course of concurrent chemoradiation with weekly carboplatin for AUC of 2 and paclitaxel 45 MG/M2. First dose of chemotherapy 10/17/2016. Status post 8 cycles. Last dose was given 12/05/2016 with partial response.[name]  CURRENT THERAPY: Consolidation treatment with immunotherapy with Imfinzi (Durvalumab) 10 MG/KG every 2 weeks. First dose 01/18/2017. Status post one cycle.  INTERVAL HISTORY: [name] [name] 76 y.o. male returns to the clinic today for follow-up visit accompanied by his wife and daughter. The patient tolerated the first cycle of his consolidation immunotherapy with Imfinzi (Durvalumab) fairly well. He denied having any chest pain, shortness of breath, cough or hemoptysis. He denied having any fever or chills. He has no nausea, vomiting, or constipation. He had few episodes of diarrhea 3 days ago and that completely improved. It could be related to meals. He is here today for evaluation before starting cycle #2.   MEDICAL HISTORY: Past Medical History:  Diagnosis Date  . Adenocarcinoma of right lung, stage 3 ([facility]) 09/20/2016  . Atrial fibrillation ([facility])    no hx of reported at preop visit of 04/21/11   . CAD (coronary artery disease)   . Carotid artery occlusion    left carotid endarterectomy  . Chronic kidney disease    AAA repair with reimplant of renals   . CHRONIC OBSTRUCTIVE PULMONARY DISEASE    pt denied at visit of 04/21/11   . CORONARY ARTERY DISEASE   . Dehydration 11/15/2016  . Disorder of left sacroiliac joint 08/24/2016  . DIVERTICULOSIS OF COLON   .  Encounter for antineoplastic chemotherapy 10/17/2016  . GERD   . H/O hiatal hernia   . Headache(784.0)    Hx: of years of years  . HYPERTENSION   . HYPOTHYROIDISM   . JOINT EFFUSION, KNEE   . KNEE, ARTHRITIS, DEGEN./OSTEO    right knee   . Myocardial infarction ([facility])    1994   . NEPHROLITHIASIS, HX OF   . OSTEOPOROSIS   . Other dysphagia   . PERIPHERAL VASCULAR DISEASE    AAA - 1994 with reimplant of renals   . Peripheral vascular disease ([facility])    subclavian stenosis PTA - 3/08   . Pure hypercholesterolemia   . Renal artery stenosis ([facility])     ALLERGIES:  is allergic to neurontin [gabapentin]; imitrex [sumatriptan]; and lipitor [atorvastatin].  MEDICATIONS:  Current Outpatient Prescriptions  Medication Sig Dispense Refill  . acetaminophen (TYLENOL) 325 MG tablet Take 325-650 mg by mouth every 6 (six) hours as needed for headache (for pain).     [name] ALPRAZolam (XANAX) 0.25 MG tablet Take 1 tablet (0.25 mg total) by mouth 3 (three) times daily as needed for anxiety. 30 tablet 0  . bumetanide (BUMEX) 1 MG tablet Take 1 tablet (1 mg total) by mouth daily as needed (swelling). 30 tablet 1  . lansoprazole (PREVACID) 15 MG capsule Take 1 capsule (15 mg total) by mouth daily. 30 capsule 5  . levothyroxine (SYNTHROID, LEVOTHROID) 112 MCG tablet Take 1 tablet (112 mcg total) by mouth daily. 30 tablet 5  . linaclotide (LINZESS) 145 MCG CAPS capsule Take 1 capsule (145 mcg  total) by mouth daily before breakfast. 30 capsule 11  . metoprolol tartrate (LOPRESSOR) 25 MG tablet TAKE ONE-HALF TABLET BY MOUTH TWO TIMES DAILY. 30 tablet 5  . niacin (SLO-NIACIN) 500 MG tablet Take 3 tablets (1,500 mg total) by mouth at bedtime. 30 tablet 0  . potassium chloride SA (K-DUR,KLOR-CON) 20 MEQ tablet Take 1 tablet (20 mEq total) by mouth daily. 30 tablet 11  . rosuvastatin (CRESTOR) 20 MG tablet Take 1 tablet (20 mg total) by mouth daily. 30 tablet 5  . tamsulosin (FLOMAX) 0.4 MG CAPS capsule Take 1 capsule  (0.4 mg total) by mouth daily after breakfast. 30 capsule 1  . warfarin (COUMADIN) 5 MG tablet 1-1/2 tablet every day except 2 tablets Tuesday Thursday Saturday 90 tablet 1  . magic mouthwash w/lidocaine SOLN Composition: 80 cc each of Extra St. Maalox Plus, Diphenhydramine, and Nystatin. Plus 240 cc of 2% viscous lidocaine to make 480 cc. Swish and swallow 2 tsp four times per day. (Patient not taking: Reported on 02/01/2017) 480 mL 2  . nitroGLYCERIN (NITROSTAT) 0.4 MG SL tablet Place 1 tablet (0.4 mg total) under the tongue every 5 (five) minutes as needed for chest pain. (Patient not taking: Reported on 01/24/2017) 25 tablet 10  . ondansetron (ZOFRAN) 8 MG tablet Take 1 tablet (8 mg total) by mouth every 8 (eight) hours as needed for nausea or vomiting. (Patient not taking: Reported on 01/24/2017) 20 tablet 0  . prochlorperazine (COMPAZINE) 10 MG tablet Take 1 tablet (10 mg total) by mouth every 6 (six) hours as needed for nausea or vomiting. (Patient not taking: Reported on 01/24/2017) 30 tablet 0  . sucralfate (CARAFATE) 1 GM/10ML suspension Take 10 mLs (1 g total) by mouth 4 (four) times daily -  with meals and at bedtime. (Patient not taking: Reported on 01/24/2017) 420 mL 1   No current facility-administered medications for this visit.     SURGICAL HISTORY:  Past Surgical History:  Procedure Laterality Date  . ABDOMINAL AORTIC ANEURYSM REPAIR     with reimplantation of renals   . CARDIAC CATHETERIZATION     2008  . CAROTID ENDARTERECTOMY Left Jan. 30, 2015   CEA  . CHOLECYSTECTOMY  2000   Gall Bladder  . COLONOSCOPY W/ BIOPSIES AND POLYPECTOMY     Hx: of  . CORONARY ARTERY BYPASS GRAFT     1995  . CORONARY STENT PLACEMENT     Hx: of  . ENDARTERECTOMY Left 07/19/2013   Procedure: ENDARTERECTOMY CAROTID;  Surgeon: Mal Misty, MD;  Location: [facility];  Service: Vascular;  Laterality: Left;  . GALLBLADDER SURGERY  2004   . JOINT REPLACEMENT     partial knee replacement on left 2002   .  KNEE SURGERY    . ORIF PELVIC FRACTURE Left 08/25/2016   Procedure: OPEN REDUCTION INTERNAL FIXATION (ORIF) PELVIC FRACTURE; plate on front, SI screw on the back;  Surgeon: Altamese Frisco, MD;  Location: [facility];  Service: Orthopedics;  Laterality: Left;  . OTHER SURGICAL HISTORY     left subclavian stenosis surgery PTA 08/2006   . OTHER SURGICAL HISTORY     carotid surgery on right 2004   . TOTAL KNEE ARTHROPLASTY  04/29/2011   Procedure: TOTAL KNEE ARTHROPLASTY;  Surgeon: Gearlean Alf;  Location: [facility];  Service: Orthopedics;  Laterality: Right;  . urinary retention    . VASCULAR SURGERY     AAA  . VASECTOMY  1973  . VIDEO BRONCHOSCOPY WITH ENDOBRONCHIAL ULTRASOUND [name]  09/09/2016   Procedure: VIDEO BRONCHOSCOPY WITH ENDOBRONCHIAL ULTRASOUND;  Surgeon: Grace Isaac, MD;  Location: [facility];  Service: Thoracic;  Laterality: [name];    REVIEW OF SYSTEMS:  A comprehensive review of systems was negative except for: Constitutional: positive for fatigue Gastrointestinal: positive for diarrhea   PHYSICAL EXAMINATION: General appearance: alert, cooperative, fatigued and no distress Head: Normocephalic, without obvious abnormality, atraumatic Neck: no adenopathy, no JVD, supple, symmetrical, trachea midline and thyroid not enlarged, symmetric, no tenderness/mass/nodules Lymph nodes: Cervical, supraclavicular, and axillary nodes normal. Resp: clear to auscultation bilaterally Back: symmetric, no curvature. ROM normal. No CVA tenderness. Cardio: regular rate and rhythm, S1, S2 normal, no murmur, click, rub or gallop GI: soft, non-tender; bowel sounds normal; no masses,  no organomegaly Extremities: extremities normal, atraumatic, no cyanosis or edema  ECOG PERFORMANCE STATUS: 1 - Symptomatic but completely ambulatory  Blood pressure (!) 114/54, pulse 70, temperature 97.6 F (36.4 C), temperature source Oral, resp. rate 18, height 5\' 7"  (1.702 m), weight 133 lb 14.4 oz (60.7 kg), SpO2 96  %.  LABORATORY DATA: Lab Results  Component Value Date   WBC 5.1 02/01/2017   HGB 10.8 (L) 02/01/2017   HCT 31.7 (L) 02/01/2017   MCV 90.1 02/01/2017   PLT 255 02/01/2017      Chemistry      Component Value Date/Time   NA 133 (L) 02/01/2017 0946   K 4.1 02/01/2017 0946   CL 100 (L) 10/06/2016 0611   CO2 23 02/01/2017 0946   BUN 12.8 02/01/2017 0946   CREATININE 0.7 02/01/2017 0946      Component Value Date/Time   CALCIUM 9.5 02/01/2017 0946   ALKPHOS 105 02/01/2017 0946   AST 21 02/01/2017 0946   ALT 15 02/01/2017 0946   BILITOT 0.51 02/01/2017 0946       RADIOGRAPHIC STUDIES: No results found.  ASSESSMENT AND PLAN:  This is a very pleasant 76 years old white male with a stage IIIa non-small cell lung cancer, adenocarcinoma with positive PDL 1 expression of 50%. The patient completed a course of concurrent chemoradiation with weekly carboplatin and paclitaxel status post 8 weeks and tolerated his treatment well except for mild dysphagia and odynophagia. He is currently undergoing consolidation immunotherapy with Imfinzi (Durvalumab) status post 1 cycle and tolerated the first cycle of this treatment fairly well. I recommended for him to proceed with cycle #2 today as a scheduled. I will see him back for follow-up visit in 2 weeks for reevaluation before the next dose of his treatment. He was advised to call immediately if he has any concerning symptoms in the interval. The patient voices understanding of current disease status and treatment options and is in agreement with the current care plan. All questions were answered. The patient knows to call the clinic with any problems, questions or concerns. We can certainly see the patient much sooner if necessary. I spent 10 minutes counseling the patient face to face. The total time spent in the appointment was 15 minutes.  Disclaimer: This note was dictated with voice recognition software. Similar sounding words can  inadvertently be transcribed and may not be corrected upon review.

## 2017-02-01 NOTE — Patient Instructions (Signed)
[facility] Discharge Instructions for Patients Receiving Chemotherapy  Today you received the following chemotherapy agents  Imfinzi   To help prevent nausea and vomiting after your treatment, we encourage you to take your nausea medication as directed   If you develop nausea and vomiting that is not controlled by your nausea medication, call the clinic.   BELOW ARE SYMPTOMS THAT SHOULD BE REPORTED IMMEDIATELY:  *FEVER GREATER THAN 100.5 F  *CHILLS WITH OR WITHOUT FEVER  NAUSEA AND VOMITING THAT IS NOT CONTROLLED WITH YOUR NAUSEA MEDICATION  *UNUSUAL SHORTNESS OF BREATH  *UNUSUAL BRUISING OR BLEEDING  TENDERNESS IN MOUTH AND THROAT WITH OR WITHOUT PRESENCE OF ULCERS  *URINARY PROBLEMS  *BOWEL PROBLEMS  UNUSUAL RASH Items with * indicate a potential emergency and should be followed up as soon as possible.  Feel free to call the clinic you have any questions or concerns. The clinic phone number is [phone].  Please show the [facility] at check-in to the Emergency Department and triage nurse.

## 2017-02-01 NOTE — Telephone Encounter (Signed)
Scheduled appt per 8/15 los - patient to get new schedule in the treatment area.

## 2017-02-06 ENCOUNTER — Telehealth: Payer: Self-pay | Admitting: Family Medicine

## 2017-02-06 DIAGNOSIS — I739 Peripheral vascular disease, unspecified: Secondary | ICD-10-CM | POA: Diagnosis not present

## 2017-02-06 DIAGNOSIS — D649 Anemia, unspecified: Secondary | ICD-10-CM | POA: Diagnosis not present

## 2017-02-06 DIAGNOSIS — I2510 Atherosclerotic heart disease of native coronary artery without angina pectoris: Secondary | ICD-10-CM | POA: Diagnosis not present

## 2017-02-06 DIAGNOSIS — C8521 Mediastinal (thymic) large B-cell lymphoma, lymph nodes of head, face, and neck: Secondary | ICD-10-CM | POA: Diagnosis not present

## 2017-02-06 DIAGNOSIS — I4891 Unspecified atrial fibrillation: Secondary | ICD-10-CM | POA: Diagnosis not present

## 2017-02-06 DIAGNOSIS — Z466 Encounter for fitting and adjustment of urinary device: Secondary | ICD-10-CM | POA: Diagnosis not present

## 2017-02-06 NOTE — Telephone Encounter (Signed)
Received a phone call from [facility] with [facility] stating that patient INR is 3.9 and PTT is 46. Patient is currently taking coumadin (5MG  tablets) He takes 1 tablet every day except on fridays he takes 1.5 tablets. Discussed with Dr.Steve Luking in real time and was told to have patient hold today's dose, take 1 tablet (5mg ) by mouth daily and repeat INR in 2 weeks. Home Health verbalized understanding.  While on the phone [facility] also informed me that patient weight has recently decreased from 134 last week to 129 this week. Patient did have a bout of diarrhea but is concerned off weight loss happening more quickly. Please advise?

## 2017-02-06 NOTE — Telephone Encounter (Signed)
No change in rec, pt has unfortunately med dx which can certainly contribute to weight loss

## 2017-02-06 NOTE — Telephone Encounter (Signed)
Spoke with Curt Bears with [facility] and informed her per Dr.Steve Luking- Unfortunately no change is recommended. Patient has a medical diagnoses that can contribute to weigh loss. Curt Bears verbalized understanding.

## 2017-02-07 ENCOUNTER — Other Ambulatory Visit: Payer: Self-pay | Admitting: *Deleted

## 2017-02-07 ENCOUNTER — Telehealth: Payer: Self-pay | Admitting: Family Medicine

## 2017-02-07 DIAGNOSIS — I739 Peripheral vascular disease, unspecified: Secondary | ICD-10-CM | POA: Diagnosis not present

## 2017-02-07 DIAGNOSIS — C8521 Mediastinal (thymic) large B-cell lymphoma, lymph nodes of head, face, and neck: Secondary | ICD-10-CM | POA: Diagnosis not present

## 2017-02-07 DIAGNOSIS — D649 Anemia, unspecified: Secondary | ICD-10-CM | POA: Diagnosis not present

## 2017-02-07 DIAGNOSIS — I2510 Atherosclerotic heart disease of native coronary artery without angina pectoris: Secondary | ICD-10-CM | POA: Diagnosis not present

## 2017-02-07 DIAGNOSIS — I4891 Unspecified atrial fibrillation: Secondary | ICD-10-CM | POA: Diagnosis not present

## 2017-02-07 DIAGNOSIS — Z466 Encounter for fitting and adjustment of urinary device: Secondary | ICD-10-CM | POA: Diagnosis not present

## 2017-02-07 MED ORDER — CEFPROZIL 500 MG PO TABS: 500.0000 mg | ORAL_TABLET | Freq: Two times a day (BID) | ORAL | 0 refills | Status: DC

## 2017-02-07 NOTE — Telephone Encounter (Signed)
Tyler Pita with [facility] says that she suspects patient has another UTI after her visit with him today and is requesting an order for a UACNS.

## 2017-02-07 NOTE — Telephone Encounter (Signed)
Ok lets do, cefzil 500 bid 7 d

## 2017-02-07 NOTE — Telephone Encounter (Signed)
Discussed with pt's wife and with maria at [facility] going out tomorrow to get urine sample. cefzil sent to pharm. Wife notified to not start antibiotic until after Verdis Frederickson collects urine tomorrow.

## 2017-02-07 NOTE — Telephone Encounter (Signed)
Uses self catheters. Having some burning and confusion. Started today.

## 2017-02-08 ENCOUNTER — Other Ambulatory Visit (HOSPITAL_COMMUNITY)
Admission: RE | Admit: 2017-02-08 | Discharge: 2017-02-08 | Disposition: A | Discharge status: Home / Self Care | Payer: Medicare Other | Source: Other Acute Inpatient Hospital | Attending: Family Medicine | Admitting: Family Medicine

## 2017-02-08 ENCOUNTER — Telehealth: Payer: Self-pay | Admitting: Family Medicine

## 2017-02-08 DIAGNOSIS — N390 Urinary tract infection, site not specified: Secondary | ICD-10-CM | POA: Diagnosis not present

## 2017-02-08 DIAGNOSIS — I4891 Unspecified atrial fibrillation: Secondary | ICD-10-CM | POA: Diagnosis not present

## 2017-02-08 DIAGNOSIS — I2510 Atherosclerotic heart disease of native coronary artery without angina pectoris: Secondary | ICD-10-CM | POA: Diagnosis not present

## 2017-02-08 DIAGNOSIS — Z466 Encounter for fitting and adjustment of urinary device: Secondary | ICD-10-CM | POA: Diagnosis not present

## 2017-02-08 DIAGNOSIS — D649 Anemia, unspecified: Secondary | ICD-10-CM | POA: Diagnosis not present

## 2017-02-08 DIAGNOSIS — C8521 Mediastinal (thymic) large B-cell lymphoma, lymph nodes of head, face, and neck: Secondary | ICD-10-CM | POA: Diagnosis not present

## 2017-02-08 DIAGNOSIS — I739 Peripheral vascular disease, unspecified: Secondary | ICD-10-CM | POA: Diagnosis not present

## 2017-02-08 LAB — URINALYSIS, COMPLETE (UACMP) WITH MICROSCOPIC
BACTERIA UA: NONE SEEN
Bilirubin Urine: NEGATIVE
GLUCOSE, UA: NEGATIVE mg/dL
KETONES UR: NEGATIVE mg/dL
LEUKOCYTES UA: NEGATIVE
NITRITE: POSITIVE — AB
PROTEIN: 100 mg/dL — AB
Specific Gravity, Urine: 1.023 (ref 1.005–1.030)
pH: 5 (ref 5.0–8.0)

## 2017-02-08 NOTE — Telephone Encounter (Signed)
Pt's wife called to get urine test results  States that sample was given at the hospital this morning through [facility] they were told that our office would call them with results  Please call pt with results

## 2017-02-08 NOTE — Telephone Encounter (Signed)
ua confirms infxn but cx not ready yet, already dis

## 2017-02-10 ENCOUNTER — Telehealth: Payer: Self-pay | Admitting: Family Medicine

## 2017-02-10 LAB — URINE CULTURE

## 2017-02-10 NOTE — Telephone Encounter (Signed)
It showed multiple species which is not unusual given a catheter specimen. Cefzil should do well for him. Obviously he has to take it to see improvement.

## 2017-02-10 NOTE — Telephone Encounter (Signed)
Home Health would like to know the results to patient's Urine Culture. Please advise?

## 2017-02-10 NOTE — Telephone Encounter (Signed)
Left message for Verdis Frederickson at [facility] care [phone])

## 2017-02-10 NOTE — Telephone Encounter (Signed)
Spoke with Verdis Frederickson with [facility] and informed her per Dr.Scott Luking- He needs to get back on his medicines including the antibiotic if he is still behaving erratically and refusing they ought to go to the ER.  Verdis Frederickson verbalized understanding and stated that she would relay the message to dermatology.

## 2017-02-10 NOTE — Telephone Encounter (Signed)
He needs to get back on his medicines including the antibiotic if he is still behaving erratically you're refusing they ought to go to the ER

## 2017-02-10 NOTE — Telephone Encounter (Signed)
Received a phone call from [facility] with [facility] stating that the patient has not started cefzil for his UTI because wife thought she was told not to start antibiotic until after the urine culture results came back. Since not starting the antibiotic patient has become increasing agitated and has got in his car and drove off without his medication , and catheters. Home health is concerned because she states patient should not be driving. He does not have medications such as coumadin and she feels patient may be confused. They do know however that the patient is with his daughter. Please advise?

## 2017-02-10 NOTE — Telephone Encounter (Signed)
Spoke with Verdis Frederickson nurse with [facility]  and informed her per Dr.Scott Luking- It showed multiple species which is not unusual given a catheter specimen. Cefzil should do well for him. Obviously he has to take it to see improvement. Verdis Frederickson verbalized understanding.

## 2017-02-13 ENCOUNTER — Telehealth: Payer: Self-pay

## 2017-02-13 DIAGNOSIS — I2510 Atherosclerotic heart disease of native coronary artery without angina pectoris: Secondary | ICD-10-CM | POA: Diagnosis not present

## 2017-02-13 DIAGNOSIS — I739 Peripheral vascular disease, unspecified: Secondary | ICD-10-CM | POA: Diagnosis not present

## 2017-02-13 DIAGNOSIS — I4891 Unspecified atrial fibrillation: Secondary | ICD-10-CM | POA: Diagnosis not present

## 2017-02-13 DIAGNOSIS — C8521 Mediastinal (thymic) large B-cell lymphoma, lymph nodes of head, face, and neck: Secondary | ICD-10-CM | POA: Diagnosis not present

## 2017-02-13 DIAGNOSIS — D649 Anemia, unspecified: Secondary | ICD-10-CM | POA: Diagnosis not present

## 2017-02-13 DIAGNOSIS — Z466 Encounter for fitting and adjustment of urinary device: Secondary | ICD-10-CM | POA: Diagnosis not present

## 2017-02-13 NOTE — Telephone Encounter (Signed)
[name] [name] called b/c [name] [name] has kidney infection and started on antibiotic on Friday. Can he receive his durvalumab on Wednesday? He sees Dr Julien Nordmann on Wed, so instructed her to keep appt and Dr Julien Nordmann can decide, though most likely yes he will get his immunetx.

## 2017-02-14 ENCOUNTER — Other Ambulatory Visit: Payer: Self-pay | Admitting: Medical Oncology

## 2017-02-14 ENCOUNTER — Encounter: Payer: Self-pay | Admitting: Family Medicine

## 2017-02-14 ENCOUNTER — Ambulatory Visit (INDEPENDENT_AMBULATORY_CARE_PROVIDER_SITE_OTHER): Payer: Medicare Other | Admitting: Family Medicine

## 2017-02-14 VITALS — BP 116/64 | Temp 97.5°F | Ht 67.0 in | Wt 131.0 lb

## 2017-02-14 DIAGNOSIS — Z7901 Long term (current) use of anticoagulants: Secondary | ICD-10-CM | POA: Diagnosis not present

## 2017-02-14 DIAGNOSIS — F0390 Unspecified dementia without behavioral disturbance: Secondary | ICD-10-CM | POA: Insufficient documentation

## 2017-02-14 DIAGNOSIS — I6529 Occlusion and stenosis of unspecified carotid artery: Secondary | ICD-10-CM

## 2017-02-14 DIAGNOSIS — N390 Urinary tract infection, site not specified: Secondary | ICD-10-CM | POA: Diagnosis not present

## 2017-02-14 DIAGNOSIS — F0280 Dementia in other diseases classified elsewhere without behavioral disturbance: Secondary | ICD-10-CM

## 2017-02-14 DIAGNOSIS — C342 Malignant neoplasm of middle lobe, bronchus or lung: Secondary | ICD-10-CM

## 2017-02-14 DIAGNOSIS — G988 Other disorders of nervous system: Secondary | ICD-10-CM

## 2017-02-14 LAB — POCT URINALYSIS DIPSTICK
RBC UA: 250
SPEC GRAV UA: 1.02 (ref 1.010–1.025)
pH, UA: 7 (ref 5.0–8.0)

## 2017-02-14 LAB — POCT INR: INR: 3.2

## 2017-02-14 MED ORDER — WARFARIN SODIUM 5 MG PO TABS: ORAL_TABLET | ORAL | 1 refills | Status: DC

## 2017-02-14 MED ORDER — DONEPEZIL HCL 5 MG PO TABS: 5.0000 mg | ORAL_TABLET | Freq: Every day | ORAL | 1 refills | Status: DC

## 2017-02-14 NOTE — Progress Notes (Signed)
Subjective:    Patient ID: [name] [name], male    DOB: [birth year], 76 y.o.   MRN: [id number] Patient and wife arrive for an extremely protracted discussion Altered Mental Status  This is a new problem.  pt arrives with wife [name]. States pt has been confused.    Complete hospital record over the past half year reviewed. Patient did suffer a stroke. Also had subsequent scan which revealed multiple infarcts including the frontal distribution.  Saw a neurologist who got a 20 out of 30 on MMSE and diagnosed early dementia. They recommended potential for considering Aricept. He also recommended looking at the patient's carotid vasculature this was done and now referrals pending to the vascular surgeons  Wife is quite concerned patient is becoming more more forgetful. Forgot how to use his remote control. Not good with finances now. Has become confused and tried to drive a couple times since his earlier hospitalization. At one point seemed to be getting lost  Patient himself seems to think he is not having much difficulties  Wife also notes patient gets agitated and angry at times.  Patient is starting on 3 times a day Xanax but fortunately low-dose 0.25 mg  Strong family history of dementia with 3 siblings and mother  pt has 3 more days on antibiotic. Results for orders placed or performed in visit on 02/14/17  POCT urinalysis dipstick  Result Value Ref Range   Color, UA     Clarity, UA     Glucose, UA     Bilirubin, UA     Ketones, UA     Spec Grav, UA 1.020 1.010 - 1.025   Blood, UA 250    pH, UA 7.0 5.0 - 8.0   Protein, UA     Urobilinogen, UA  0.2 or 1.0 E.U./dL   Nitrite, UA     Leukocytes, UA  Negative   INR today 3.2. Takes coumadin 5mg  one daily.   Review of Systems No headache, no major weight loss or weight gain, no chest pain no back pain abdominal pain no change in bowel habits complete ROS otherwise negative     Objective:   Physical Exam  Alert, Active,  thin, appears somewhat chronically ill, oriented 2, vitals reviewed and stable, NAD ENT-TM's and ext canals WNL bilat via otoscopic exam Soft palate, tonsils and post pharynx WNL via oropharyngeal exam Neck-symmetric, no masses; thyroid nonpalpable and nontender Pulmonary-no tachypnea or accessory muscle use; Clear without wheezes via auscultation Card--no abnrml murmurs, rhythm reg and rate WNL Carotid pulses symmetric, without bruits       Assessment & Plan:  Impression #1 dementia. Discussed at great length. Potentially multi-factorial with multi-infarct certainly consideration, along with potential for Alzheimer's with a strong family history. Will go ahead and initiate Aricept 5 mg every morning low dose. Patient advised strongly not to drive. I will maintain Coumadin for now. Patient is 6 months out from his PE. However he continues have cancer, and he has vascular carotid issues with ongoing risk for further stroke, and there is some potential benefit. Patient due to see his hematologist soon and I will ask him to weigh in on this also. INR level today reasonable will maintain same dose recheck in several weeks.  Also of note today's urinalysis has a few red blood cells per high-power field. Not unusual frequent self-catheterization. Advised family need to avoid placing patient on antibiotics every time he has confusion along with a few residual urine changes considering his  chronic urinary retention. This would lead to over treatment with antibiotics and no help with his chronic dementia    Greater than 50% of this 40 minute face to face visit was spent in counseling and discussion and coordination of care regarding the above diagnosis/diagnosies

## 2017-02-15 ENCOUNTER — Ambulatory Visit (HOSPITAL_BASED_OUTPATIENT_CLINIC_OR_DEPARTMENT_OTHER): Payer: Medicare Other

## 2017-02-15 ENCOUNTER — Ambulatory Visit (HOSPITAL_BASED_OUTPATIENT_CLINIC_OR_DEPARTMENT_OTHER): Payer: Medicare Other | Admitting: Internal Medicine

## 2017-02-15 ENCOUNTER — Encounter: Payer: Self-pay | Admitting: Internal Medicine

## 2017-02-15 ENCOUNTER — Other Ambulatory Visit (HOSPITAL_BASED_OUTPATIENT_CLINIC_OR_DEPARTMENT_OTHER): Payer: Medicare Other

## 2017-02-15 VITALS — BP 127/57 | HR 63 | Temp 97.7°F | Resp 17 | Wt 131.8 lb

## 2017-02-15 DIAGNOSIS — Z5112 Encounter for antineoplastic immunotherapy: Secondary | ICD-10-CM

## 2017-02-15 DIAGNOSIS — Z5111 Encounter for antineoplastic chemotherapy: Secondary | ICD-10-CM

## 2017-02-15 DIAGNOSIS — C342 Malignant neoplasm of middle lobe, bronchus or lung: Secondary | ICD-10-CM

## 2017-02-15 DIAGNOSIS — C3411 Malignant neoplasm of upper lobe, right bronchus or lung: Secondary | ICD-10-CM

## 2017-02-15 LAB — CBC WITH DIFFERENTIAL/PLATELET
BASO%: 0.5 % (ref 0.0–2.0)
Basophils Absolute: 0 10*3/uL (ref 0.0–0.1)
EOS ABS: 0.1 10*3/uL (ref 0.0–0.5)
EOS%: 2.2 % (ref 0.0–7.0)
HEMATOCRIT: 34.2 % — AB (ref 38.4–49.9)
HGB: 11 g/dL — ABNORMAL LOW (ref 13.0–17.1)
LYMPH%: 18.6 % (ref 14.0–49.0)
MCH: 29.2 pg (ref 27.2–33.4)
MCHC: 32.2 g/dL (ref 32.0–36.0)
MCV: 90.7 fL (ref 79.3–98.0)
MONO#: 0.2 10*3/uL (ref 0.1–0.9)
MONO%: 6 % (ref 0.0–14.0)
NEUT#: 2.9 10*3/uL (ref 1.5–6.5)
NEUT%: 72.7 % (ref 39.0–75.0)
PLATELETS: 184 10*3/uL (ref 140–400)
RBC: 3.77 10*6/uL — AB (ref 4.20–5.82)
RDW: 15.4 % — ABNORMAL HIGH (ref 11.0–14.6)
WBC: 4 10*3/uL (ref 4.0–10.3)
lymph#: 0.8 10*3/uL — ABNORMAL LOW (ref 0.9–3.3)

## 2017-02-15 LAB — COMPREHENSIVE METABOLIC PANEL
ALT: 19 U/L (ref 0–55)
ANION GAP: 5 meq/L (ref 3–11)
AST: 21 U/L (ref 5–34)
Albumin: 2.8 g/dL — ABNORMAL LOW (ref 3.5–5.0)
Alkaline Phosphatase: 95 U/L (ref 40–150)
BILIRUBIN TOTAL: 0.37 mg/dL (ref 0.20–1.20)
BUN: 12.9 mg/dL (ref 7.0–26.0)
CALCIUM: 9.6 mg/dL (ref 8.4–10.4)
CO2: 27 mEq/L (ref 22–29)
CREATININE: 0.7 mg/dL (ref 0.7–1.3)
Chloride: 102 mEq/L (ref 98–109)
EGFR: >90 mL/min/{1.73_m2} (ref >90)
Glucose: 98 mg/dl (ref 70–140)
Potassium: 4.5 mEq/L (ref 3.5–5.1)
Sodium: 134 mEq/L — ABNORMAL LOW (ref 136–145)
TOTAL PROTEIN: 6.7 g/dL (ref 6.4–8.3)

## 2017-02-15 MED ORDER — SODIUM CHLORIDE 0.9 % IV SOLN
Freq: Once | INTRAVENOUS | Status: AC
  Administered 2017-02-15: 13:00:00 via INTRAVENOUS

## 2017-02-15 MED ORDER — SODIUM CHLORIDE 0.9 % IV SOLN
10.0000 mg/kg | Freq: Once | INTRAVENOUS | Status: AC
  Administered 2017-02-15: 620 mg via INTRAVENOUS
  Filled 2017-02-15: qty 2.4

## 2017-02-15 NOTE — Progress Notes (Signed)
[place] [facility] [place] [phone]   [phone]  OFFICE PROGRESS NOTE  [name], MD [address] [place] [address]  DIAGNOSIS: Stage IIIA (T1b, N2, M0) non-small cell lung cancer, adenocarcinoma with positive PDL 1 expression 50% diagnosed in March 2018 and presented with right upper lobe lung nodule in addition to mediastinal lymphadenopathy.  PRIOR THERAPY: Course of concurrent chemoradiation with weekly carboplatin for AUC of 2 and paclitaxel 45 MG/M2. First dose of chemotherapy 10/17/2016. Status post 8 cycles. Last dose was given 12/05/2016 with partial response.[name]  CURRENT THERAPY: Consolidation treatment with immunotherapy with Imfinzi (Durvalumab) 10 MG/KG every 2 weeks. First dose 01/18/2017. Status post 2 cycles.  INTERVAL HISTORY: [name] [name] 76 y.o. male returns to the clinic today for follow-up visit accompanied by his wife. The patient tolerated the second cycle of his treatment with immunotherapy fairly well with no significant adverse effects. He denied having any significant chest pain, shortness of breath, cough or hemoptysis. He has no nausea, vomiting, diarrhea or constipation. He has no fever or chills. He states today for evaluation before starting cycle #3.  MEDICAL HISTORY: Past Medical History:  Diagnosis Date  . Adenocarcinoma of right lung, stage 3 ([facility]) 09/20/2016  . Atrial fibrillation ([facility])    no hx of reported at preop visit of 04/21/11   . CAD (coronary artery disease)   . Carotid artery occlusion    left carotid endarterectomy  . Chronic kidney disease    AAA repair with reimplant of renals   . CHRONIC OBSTRUCTIVE PULMONARY DISEASE    pt denied at visit of 04/21/11   . CORONARY ARTERY DISEASE   . Dehydration 11/15/2016  . Disorder of left sacroiliac joint 08/24/2016  . DIVERTICULOSIS OF COLON   . Encounter for antineoplastic chemotherapy 10/17/2016  . GERD   . H/O hiatal hernia   . Headache(784.0)     Hx: of years of years  . HYPERTENSION   . HYPOTHYROIDISM   . JOINT EFFUSION, KNEE   . KNEE, ARTHRITIS, DEGEN./OSTEO    right knee   . Myocardial infarction ([facility])    1994   . NEPHROLITHIASIS, HX OF   . OSTEOPOROSIS   . Other dysphagia   . PERIPHERAL VASCULAR DISEASE    AAA - 1994 with reimplant of renals   . Peripheral vascular disease ([facility])    subclavian stenosis PTA - 3/08   . Pure hypercholesterolemia   . Renal artery stenosis ([facility])     ALLERGIES:  is allergic to neurontin [gabapentin]; imitrex [sumatriptan]; and lipitor [atorvastatin].  MEDICATIONS:  Current Outpatient Prescriptions  Medication Sig Dispense Refill  . acetaminophen (TYLENOL) 325 MG tablet Take 325-650 mg by mouth every 6 (six) hours as needed for headache (for pain).     [name] ALPRAZolam (XANAX) 0.25 MG tablet Take 1 tablet (0.25 mg total) by mouth 3 (three) times daily as needed for anxiety. 30 tablet 0  . bumetanide (BUMEX) 1 MG tablet Take 1 tablet (1 mg total) by mouth daily as needed (swelling). 30 tablet 1  . cefPROZIL (CEFZIL) 500 MG tablet Take 1 tablet (500 mg total) by mouth 2 (two) times daily. 14 tablet 0  . donepezil (ARICEPT) 5 MG tablet Take 1 tablet (5 mg total) by mouth at bedtime. 90 tablet 1  . lansoprazole (PREVACID) 15 MG capsule Take 1 capsule (15 mg total) by mouth daily. 30 capsule 5  . levothyroxine (SYNTHROID, LEVOTHROID) 112 MCG tablet Take 1  tablet (112 mcg total) by mouth daily. 30 tablet 5  . linaclotide (LINZESS) 145 MCG CAPS capsule Take 1 capsule (145 mcg total) by mouth daily before breakfast. 30 capsule 11  . magic mouthwash w/lidocaine SOLN Composition: 80 cc each of Extra St. Maalox Plus, Diphenhydramine, and Nystatin. Plus 240 cc of 2% viscous lidocaine to make 480 cc. Swish and swallow 2 tsp four times per day. 480 mL 2  . metoprolol tartrate (LOPRESSOR) 25 MG tablet TAKE ONE-HALF TABLET BY MOUTH TWO TIMES DAILY. 30 tablet 5  . niacin (SLO-NIACIN) 500 MG tablet Take 3 tablets  (1,500 mg total) by mouth at bedtime. 30 tablet 0  . ondansetron (ZOFRAN) 8 MG tablet Take 1 tablet (8 mg total) by mouth every 8 (eight) hours as needed for nausea or vomiting. 20 tablet 0  . potassium chloride SA (K-DUR,KLOR-CON) 20 MEQ tablet Take 1 tablet (20 mEq total) by mouth daily. 30 tablet 11  . rosuvastatin (CRESTOR) 20 MG tablet Take 1 tablet (20 mg total) by mouth daily. 30 tablet 5  . tamsulosin (FLOMAX) 0.4 MG CAPS capsule Take 1 capsule (0.4 mg total) by mouth daily after breakfast. 30 capsule 1  . warfarin (COUMADIN) 5 MG tablet Take one tablet daily. take as directed by physician. 90 tablet 1  . nitroGLYCERIN (NITROSTAT) 0.4 MG SL tablet Place 1 tablet (0.4 mg total) under the tongue every 5 (five) minutes as needed for chest pain. (Patient not taking: Reported on 02/15/2017) 25 tablet 10  . prochlorperazine (COMPAZINE) 10 MG tablet Take 1 tablet (10 mg total) by mouth every 6 (six) hours as needed for nausea or vomiting. (Patient not taking: Reported on 02/15/2017) 30 tablet 0  . sucralfate (CARAFATE) 1 GM/10ML suspension Take 10 mLs (1 g total) by mouth 4 (four) times daily -  with meals and at bedtime. (Patient not taking: Reported on 02/15/2017) 420 mL 1   No current facility-administered medications for this visit.     SURGICAL HISTORY:  Past Surgical History:  Procedure Laterality Date  . ABDOMINAL AORTIC ANEURYSM REPAIR     with reimplantation of renals   . CARDIAC CATHETERIZATION     2008  . CAROTID ENDARTERECTOMY Left Jan. 30, 2015   CEA  . CHOLECYSTECTOMY  2000   Gall Bladder  . COLONOSCOPY W/ BIOPSIES AND POLYPECTOMY     Hx: of  . CORONARY ARTERY BYPASS GRAFT     1995  . CORONARY STENT PLACEMENT     Hx: of  . ENDARTERECTOMY Left 07/19/2013   Procedure: ENDARTERECTOMY CAROTID;  Surgeon: Mal Misty, MD;  Location: [facility];  Service: Vascular;  Laterality: Left;  . GALLBLADDER SURGERY  2004   . JOINT REPLACEMENT     partial knee replacement on left 2002   .  KNEE SURGERY    . ORIF PELVIC FRACTURE Left 08/25/2016   Procedure: OPEN REDUCTION INTERNAL FIXATION (ORIF) PELVIC FRACTURE; plate on front, SI screw on the back;  Surgeon: Altamese Corinne, MD;  Location: [facility];  Service: Orthopedics;  Laterality: Left;  . OTHER SURGICAL HISTORY     left subclavian stenosis surgery PTA 08/2006   . OTHER SURGICAL HISTORY     carotid surgery on right 2004   . TOTAL KNEE ARTHROPLASTY  04/29/2011   Procedure: TOTAL KNEE ARTHROPLASTY;  Surgeon: Gearlean Alf;  Location: [facility];  Service: Orthopedics;  Laterality: Right;  . urinary retention    . VASCULAR SURGERY     AAA  . VASECTOMY  [facility] WITH ENDOBRONCHIAL ULTRASOUND [name] 09/09/2016   Procedure: VIDEO BRONCHOSCOPY WITH ENDOBRONCHIAL ULTRASOUND;  Surgeon: Grace Isaac, MD;  [facility];  Service: Thoracic;  Laterality: [name];    REVIEW OF SYSTEMS:  A comprehensive review of systems was negative except for: Constitutional: positive for fatigue   PHYSICAL EXAMINATION: General appearance: alert, cooperative, fatigued and no distress Head: Normocephalic, without obvious abnormality, atraumatic Neck: no adenopathy, no JVD, supple, symmetrical, trachea midline and thyroid not enlarged, symmetric, no tenderness/mass/nodules Lymph nodes: Cervical, supraclavicular, and axillary nodes normal. Resp: clear to auscultation bilaterally Back: symmetric, no curvature. ROM normal. No CVA tenderness. Cardio: regular rate and rhythm, S1, S2 normal, no murmur, click, rub or gallop GI: soft, non-tender; bowel sounds normal; no masses,  no organomegaly Extremities: extremities normal, atraumatic, no cyanosis or edema  ECOG PERFORMANCE STATUS: 1 - Symptomatic but completely ambulatory  Blood pressure (!) 127/57, pulse 63, temperature 97.7 F (36.5 C), temperature source Oral, resp. rate 17, weight 131 lb 12.8 oz (59.8 kg), SpO2 98 %.  LABORATORY DATA: Lab Results  Component Value Date   WBC 4.0  02/15/2017   HGB 11.0 (L) 02/15/2017   HCT 34.2 (L) 02/15/2017   MCV 90.7 02/15/2017   PLT 184 02/15/2017      Chemistry      Component Value Date/Time   NA 134 (L) 02/15/2017 1057   K 4.5 02/15/2017 1057   CL 100 (L) 10/06/2016 0611   CO2 27 02/15/2017 1057   BUN 12.9 02/15/2017 1057   CREATININE 0.7 02/15/2017 1057      Component Value Date/Time   CALCIUM 9.6 02/15/2017 1057   ALKPHOS 95 02/15/2017 1057   AST 21 02/15/2017 1057   ALT 19 02/15/2017 1057   BILITOT 0.37 02/15/2017 1057       RADIOGRAPHIC STUDIES: No results found.  ASSESSMENT AND PLAN:  This is a very pleasant 76 years old white male with a stage IIIa non-small cell lung cancer, adenocarcinoma with positive PDL 1 expression of 50%. The patient completed a course of concurrent chemoradiation with weekly carboplatin and paclitaxel status post 8 weeks and tolerated his treatment well except for mild dysphagia and odynophagia. He is currently undergoing consolidation immunotherapy with Imfinzi (Durvalumab) status post 2 cycles. He tolerated cycle #2 of his treatment fairly well. I recommended for the patient to proceed with his immunotherapy today as scheduled and he will proceed with cycle #3. The patient would come back for follow-up visit in 2 weeks for evaluation before starting cycle #4. He was advised to call immediately if he has any concerning symptoms in the interval. The patient voices understanding of current disease status and treatment options and is in agreement with the current care plan. All questions were answered. The patient knows to call the clinic with any problems, questions or concerns. We can certainly see the patient much sooner if necessary. I spent 10 minutes counseling the patient face to face. The total time spent in the appointment was 15 minutes.  Disclaimer: This note was dictated with voice recognition software. Similar sounding words can inadvertently be transcribed and may not  be corrected upon review.

## 2017-02-15 NOTE — Patient Instructions (Signed)
Durvalumab injection  What is this medicine?  DURVALUMAB (dur VAL ue mab) is a monoclonal antibody. It is used to treat urothelial cancer.  This medicine may be used for other purposes; ask your health care provider or pharmacist if you have questions.  COMMON BRAND NAME(S): IMFINZI  What should I tell my health care provider before I take this medicine?  They need to know if you have any of these conditions:  -diabetes  -immune system problems  -infection  -inflammatory bowel disease  -kidney disease  -liver disease  -lung or breathing disease  -lupus  -organ transplant  -stomach or intestine problems  -thyroid disease  -an unusual or allergic reaction to durvalumab, other medicines, foods, dyes, or preservatives  -pregnant or trying to get pregnant  -breast-feeding  How should I use this medicine?  This medicine is for infusion into a vein. It is given by a health care professional in a hospital or clinic setting.  A special MedGuide will be given to you before each treatment. Be sure to read this information carefully each time.  Talk to your pediatrician regarding the use of this medicine in children. Special care may be needed.  Overdosage: If you think you have taken too much of this medicine contact a poison control center or emergency room at once.  NOTE: This medicine is only for you. Do not share this medicine with others.  What if I miss a dose?  It is important not to miss your dose. Call your doctor or health care professional if you are unable to keep an appointment.  What may interact with this medicine?  Interactions have not been studied.  This list may not describe all possible interactions. Give your health care provider a list of all the medicines, herbs, non-prescription drugs, or dietary supplements you use. Also tell them if you smoke, drink alcohol, or use illegal drugs. Some items may interact with your medicine.  What should I watch for while using this medicine?  This drug may make you  feel generally unwell. Continue your course of treatment even though you feel ill unless your doctor tells you to stop.  You may need blood work done while you are taking this medicine.  Do not become pregnant while taking this medicine or for 3 months after stopping it. Women should inform their doctor if they wish to become pregnant or think they might be pregnant. There is a potential for serious side effects to an unborn child. Talk to your health care professional or pharmacist for more information. Do not breast-feed an infant while taking this medicine or for 3 months after stopping it.  What side effects may I notice from receiving this medicine?  Side effects that you should report to your doctor or health care professional as soon as possible:  -allergic reactions like skin rash, itching or hives, swelling of the face, lips, or tongue  -black, tarry stools  -bloody or watery diarrhea  -breathing problems  -change in emotions or moods  -change in sex drive  -changes in vision  -chest pain or chest tightness  -chills  -confusion  -cough  -facial flushing  -fever  -headache  -signs and symptoms of high blood sugar such as dizziness; dry mouth; dry skin; fruity breath; nausea; stomach pain; increased hunger or thirst; increased urination  -signs and symptoms of liver injury like dark yellow or brown urine; general ill feeling or flu-like symptoms; light-colored stools; loss of appetite; nausea; right upper belly pain;   unusually weak or tired; yellowing of the eyes or skin  -stomach pain  -trouble passing urine or change in the amount of urine  -weight gain or weight loss  Side effects that usually do not require medical attention (report these to your doctor or health care professional if they continue or are bothersome):  -bone pain  -constipation  -loss of appetite  -muscle pain  -nausea  -swelling of the ankles, feet, hands  -tiredness  This list may not describe all possible side effects. Call your doctor  for medical advice about side effects. You may report side effects to FDA at 1-800-FDA-1088.  Where should I keep my medicine?  This drug is given in a hospital or clinic and will not be stored at home.  NOTE: This sheet is a summary. It may not cover all possible information. If you have questions about this medicine, talk to your doctor, pharmacist, or health care provider.  © 2018 [employer]/Gold Standard (2016-01-08 15:50:36)

## 2017-02-21 ENCOUNTER — Ambulatory Visit: Payer: Medicare Other | Admitting: Family Medicine

## 2017-02-21 DIAGNOSIS — I4891 Unspecified atrial fibrillation: Secondary | ICD-10-CM | POA: Diagnosis not present

## 2017-02-21 DIAGNOSIS — I739 Peripheral vascular disease, unspecified: Secondary | ICD-10-CM | POA: Diagnosis not present

## 2017-02-21 DIAGNOSIS — D649 Anemia, unspecified: Secondary | ICD-10-CM | POA: Diagnosis not present

## 2017-02-21 DIAGNOSIS — Z466 Encounter for fitting and adjustment of urinary device: Secondary | ICD-10-CM | POA: Diagnosis not present

## 2017-02-21 DIAGNOSIS — I2510 Atherosclerotic heart disease of native coronary artery without angina pectoris: Secondary | ICD-10-CM | POA: Diagnosis not present

## 2017-02-21 DIAGNOSIS — C8521 Mediastinal (thymic) large B-cell lymphoma, lymph nodes of head, face, and neck: Secondary | ICD-10-CM | POA: Diagnosis not present

## 2017-02-27 ENCOUNTER — Ambulatory Visit: Payer: Medicare Other | Admitting: Family Medicine

## 2017-02-27 DIAGNOSIS — I739 Peripheral vascular disease, unspecified: Secondary | ICD-10-CM | POA: Diagnosis not present

## 2017-02-27 DIAGNOSIS — D649 Anemia, unspecified: Secondary | ICD-10-CM | POA: Diagnosis not present

## 2017-02-27 DIAGNOSIS — I2510 Atherosclerotic heart disease of native coronary artery without angina pectoris: Secondary | ICD-10-CM | POA: Diagnosis not present

## 2017-02-27 DIAGNOSIS — C8521 Mediastinal (thymic) large B-cell lymphoma, lymph nodes of head, face, and neck: Secondary | ICD-10-CM | POA: Diagnosis not present

## 2017-02-27 DIAGNOSIS — Z466 Encounter for fitting and adjustment of urinary device: Secondary | ICD-10-CM | POA: Diagnosis not present

## 2017-02-27 DIAGNOSIS — I4891 Unspecified atrial fibrillation: Secondary | ICD-10-CM | POA: Diagnosis not present

## 2017-02-28 ENCOUNTER — Other Ambulatory Visit: Payer: Self-pay | Admitting: Medical Oncology

## 2017-02-28 DIAGNOSIS — C342 Malignant neoplasm of middle lobe, bronchus or lung: Secondary | ICD-10-CM

## 2017-02-28 DIAGNOSIS — I739 Peripheral vascular disease, unspecified: Secondary | ICD-10-CM | POA: Diagnosis not present

## 2017-02-28 DIAGNOSIS — I2510 Atherosclerotic heart disease of native coronary artery without angina pectoris: Secondary | ICD-10-CM | POA: Diagnosis not present

## 2017-02-28 DIAGNOSIS — C8521 Mediastinal (thymic) large B-cell lymphoma, lymph nodes of head, face, and neck: Secondary | ICD-10-CM | POA: Diagnosis not present

## 2017-02-28 DIAGNOSIS — I4891 Unspecified atrial fibrillation: Secondary | ICD-10-CM | POA: Diagnosis not present

## 2017-02-28 DIAGNOSIS — D649 Anemia, unspecified: Secondary | ICD-10-CM | POA: Diagnosis not present

## 2017-02-28 DIAGNOSIS — Z466 Encounter for fitting and adjustment of urinary device: Secondary | ICD-10-CM | POA: Diagnosis not present

## 2017-03-01 ENCOUNTER — Encounter: Payer: Self-pay | Admitting: Internal Medicine

## 2017-03-01 ENCOUNTER — Other Ambulatory Visit (HOSPITAL_BASED_OUTPATIENT_CLINIC_OR_DEPARTMENT_OTHER): Payer: Medicare Other

## 2017-03-01 ENCOUNTER — Telehealth: Payer: Self-pay | Admitting: Internal Medicine

## 2017-03-01 ENCOUNTER — Ambulatory Visit (HOSPITAL_BASED_OUTPATIENT_CLINIC_OR_DEPARTMENT_OTHER): Payer: Medicare Other

## 2017-03-01 ENCOUNTER — Encounter (INDEPENDENT_AMBULATORY_CARE_PROVIDER_SITE_OTHER): Payer: Self-pay

## 2017-03-01 ENCOUNTER — Ambulatory Visit (HOSPITAL_BASED_OUTPATIENT_CLINIC_OR_DEPARTMENT_OTHER): Payer: Medicare Other | Admitting: Internal Medicine

## 2017-03-01 VITALS — BP 122/57 | HR 64 | Temp 97.6°F | Resp 18 | Ht 67.0 in | Wt 130.9 lb

## 2017-03-01 VITALS — BP 117/59 | HR 64 | Temp 98.2°F | Resp 16

## 2017-03-01 DIAGNOSIS — C3411 Malignant neoplasm of upper lobe, right bronchus or lung: Secondary | ICD-10-CM | POA: Diagnosis not present

## 2017-03-01 DIAGNOSIS — C342 Malignant neoplasm of middle lobe, bronchus or lung: Secondary | ICD-10-CM

## 2017-03-01 DIAGNOSIS — Z5112 Encounter for antineoplastic immunotherapy: Secondary | ICD-10-CM

## 2017-03-01 LAB — CBC WITH DIFFERENTIAL/PLATELET
BASO%: 0.8 % (ref 0.0–2.0)
BASOS ABS: 0.1 10*3/uL (ref 0.0–0.1)
EOS ABS: 0.1 10*3/uL (ref 0.0–0.5)
EOS%: 1.2 % (ref 0.0–7.0)
HEMATOCRIT: 40.1 % (ref 38.4–49.9)
HEMOGLOBIN: 13.4 g/dL (ref 13.0–17.1)
LYMPH%: 9 % — ABNORMAL LOW (ref 14.0–49.0)
MCH: 29.6 pg (ref 27.2–33.4)
MCHC: 33.5 g/dL (ref 32.0–36.0)
MCV: 88.4 fL (ref 79.3–98.0)
MONO#: 0.3 10*3/uL (ref 0.1–0.9)
MONO%: 4.3 % (ref 0.0–14.0)
NEUT%: 84.7 % — ABNORMAL HIGH (ref 39.0–75.0)
NEUTROS ABS: 6.6 10*3/uL — AB (ref 1.5–6.5)
Platelets: 186 10*3/uL (ref 140–400)
RBC: 4.54 10*6/uL (ref 4.20–5.82)
RDW: 16.5 % — AB (ref 11.0–14.6)
WBC: 7.8 10*3/uL (ref 4.0–10.3)
lymph#: 0.7 10*3/uL — ABNORMAL LOW (ref 0.9–3.3)

## 2017-03-01 LAB — COMPREHENSIVE METABOLIC PANEL
ALT: 17 U/L (ref 0–55)
AST: 21 U/L (ref 5–34)
Albumin: 3.3 g/dL — ABNORMAL LOW (ref 3.5–5.0)
Alkaline Phosphatase: 118 U/L (ref 40–150)
Anion Gap: 9 mEq/L (ref 3–11)
BILIRUBIN TOTAL: 0.36 mg/dL (ref 0.20–1.20)
BUN: 13.3 mg/dL (ref 7.0–26.0)
CO2: 24 mEq/L (ref 22–29)
Calcium: 10.1 mg/dL (ref 8.4–10.4)
Chloride: 101 mEq/L (ref 98–109)
Creatinine: 0.7 mg/dL (ref 0.7–1.3)
EGFR: 88 mL/min/{1.73_m2} — AB (ref >90)
Glucose: 106 mg/dl (ref 70–140)
Potassium: 3.8 mEq/L (ref 3.5–5.1)
Sodium: 135 mEq/L — ABNORMAL LOW (ref 136–145)
TOTAL PROTEIN: 7.3 g/dL (ref 6.4–8.3)

## 2017-03-01 MED ORDER — SODIUM CHLORIDE 0.9 % IV SOLN
Freq: Once | INTRAVENOUS | Status: AC
  Administered 2017-03-01: 14:00:00 via INTRAVENOUS

## 2017-03-01 MED ORDER — SODIUM CHLORIDE 0.9 % IV SOLN
10.0000 mg/kg | Freq: Once | INTRAVENOUS | Status: AC
  Administered 2017-03-01: 620 mg via INTRAVENOUS
  Filled 2017-03-01: qty 10

## 2017-03-01 NOTE — Patient Instructions (Addendum)
[facility] Discharge Instructions for Patients Receiving Chemotherapy  Today you received the following chemotherapy agents Durvalumab To help prevent nausea and vomiting after your treatment, we encourage you to take your nausea medication as directed If you develop nausea and vomiting that is not controlled by your nausea medication, call the clinic.   BELOW ARE SYMPTOMS THAT SHOULD BE REPORTED IMMEDIATELY:  *FEVER GREATER THAN 100.5 F  *CHILLS WITH OR WITHOUT FEVER  NAUSEA AND VOMITING THAT IS NOT CONTROLLED WITH YOUR NAUSEA MEDICATION  *UNUSUAL SHORTNESS OF BREATH  *UNUSUAL BRUISING OR BLEEDING  TENDERNESS IN MOUTH AND THROAT WITH OR WITHOUT PRESENCE OF ULCERS  *URINARY PROBLEMS  *BOWEL PROBLEMS  UNUSUAL RASH Items with * indicate a potential emergency and should be followed up as soon as possible.  Feel free to call the clinic you have any questions or concerns. The clinic phone number is [phone].  Please show the [facility] at check-in to the Emergency Department and triage nurse.

## 2017-03-01 NOTE — Progress Notes (Signed)
[name] Telephone:([phone]   [phone]  OFFICE PROGRESS NOTE  [name], MD [address] [place] [address]  DIAGNOSIS: Stage IIIA (T1b, N2, M0) non-small cell lung cancer, adenocarcinoma with positive PDL 1 expression 50% diagnosed in March 2018 and presented with right upper lobe lung nodule in addition to mediastinal lymphadenopathy.  PRIOR THERAPY: Course of concurrent chemoradiation with weekly carboplatin for AUC of 2 and paclitaxel 45 MG/M2. First dose of chemotherapy 10/17/2016. Status post 8 cycles. Last dose was given 12/05/2016 with partial response.[name]  CURRENT THERAPY: Consolidation treatment with immunotherapy with Imfinzi (Durvalumab) 10 MG/KG every 2 weeks. First dose 01/18/2017. Status post 3 cycles.  INTERVAL HISTORY: [name] [name] 76 y.o. male returns to the clinic today for follow-up visit accompanied by his wife. The patient is feeling fine today with no specific complaints. He denied having any chest pain, shortness breath, cough or hemoptysis. He has no nausea, vomiting, diarrhea or constipation. He denied having any skin rash. The patient continues to tolerate his treatment with immunotherapy fairly well and he feels much better. He says today for evaluation before starting cycle #4 of his treatment.  MEDICAL HISTORY: Past Medical History:  Diagnosis Date  . Adenocarcinoma of right lung, stage 3 ([facility]) 09/20/2016  . Atrial fibrillation ([facility])    no hx of reported at preop visit of 04/21/11   . CAD (coronary artery disease)   . Carotid artery occlusion    left carotid endarterectomy  . Chronic kidney disease    AAA repair with reimplant of renals   . CHRONIC OBSTRUCTIVE PULMONARY DISEASE    pt denied at visit of 04/21/11   . CORONARY ARTERY DISEASE   . Dehydration 11/15/2016  . Disorder of left sacroiliac joint 08/24/2016  . DIVERTICULOSIS OF COLON   . Encounter for antineoplastic chemotherapy 10/17/2016  .  GERD   . H/O hiatal hernia   . Headache(784.0)    Hx: of years of years  . HYPERTENSION   . HYPOTHYROIDISM   . JOINT EFFUSION, KNEE   . KNEE, ARTHRITIS, DEGEN./OSTEO    right knee   . Myocardial infarction ([facility])    1994   . NEPHROLITHIASIS, HX OF   . OSTEOPOROSIS   . Other dysphagia   . PERIPHERAL VASCULAR DISEASE    AAA - 1994 with reimplant of renals   . Peripheral vascular disease ([facility])    subclavian stenosis PTA - 3/08   . Pure hypercholesterolemia   . Renal artery stenosis ([facility])     ALLERGIES:  is allergic to neurontin [gabapentin]; imitrex [sumatriptan]; and lipitor [atorvastatin].  MEDICATIONS:  Current Outpatient Prescriptions  Medication Sig Dispense Refill  . acetaminophen (TYLENOL) 325 MG tablet Take 325-650 mg by mouth every 6 (six) hours as needed for headache (for pain).     [name] ALPRAZolam (XANAX) 0.25 MG tablet Take 1 tablet (0.25 mg total) by mouth 3 (three) times daily as needed for anxiety. 30 tablet 0  . bumetanide (BUMEX) 1 MG tablet Take 1 tablet (1 mg total) by mouth daily as needed (swelling). 30 tablet 1  . cefPROZIL (CEFZIL) 500 MG tablet Take 1 tablet (500 mg total) by mouth 2 (two) times daily. 14 tablet 0  . donepezil (ARICEPT) 5 MG tablet Take 1 tablet (5 mg total) by mouth at bedtime. 90 tablet 1  . lansoprazole (PREVACID) 15 MG capsule Take 1 capsule (15 mg total) by mouth daily. 30 capsule 5  .  levothyroxine (SYNTHROID, LEVOTHROID) 112 MCG tablet Take 1 tablet (112 mcg total) by mouth daily. 30 tablet 5  . linaclotide (LINZESS) 145 MCG CAPS capsule Take 1 capsule (145 mcg total) by mouth daily before breakfast. 30 capsule 11  . magic mouthwash w/lidocaine SOLN Composition: 80 cc each of Extra St. Maalox Plus, Diphenhydramine, and Nystatin. Plus 240 cc of 2% viscous lidocaine to make 480 cc. Swish and swallow 2 tsp four times per day. 480 mL 2  . metoprolol tartrate (LOPRESSOR) 25 MG tablet TAKE ONE-HALF TABLET BY MOUTH TWO TIMES DAILY. 30 tablet 5  .  niacin (SLO-NIACIN) 500 MG tablet Take 3 tablets (1,500 mg total) by mouth at bedtime. 30 tablet 0  . nitroGLYCERIN (NITROSTAT) 0.4 MG SL tablet Place 1 tablet (0.4 mg total) under the tongue every 5 (five) minutes as needed for chest pain. (Patient not taking: Reported on 02/15/2017) 25 tablet 10  . ondansetron (ZOFRAN) 8 MG tablet Take 1 tablet (8 mg total) by mouth every 8 (eight) hours as needed for nausea or vomiting. 20 tablet 0  . potassium chloride SA (K-DUR,KLOR-CON) 20 MEQ tablet Take 1 tablet (20 mEq total) by mouth daily. 30 tablet 11  . prochlorperazine (COMPAZINE) 10 MG tablet Take 1 tablet (10 mg total) by mouth every 6 (six) hours as needed for nausea or vomiting. (Patient not taking: Reported on 02/15/2017) 30 tablet 0  . rosuvastatin (CRESTOR) 20 MG tablet Take 1 tablet (20 mg total) by mouth daily. 30 tablet 5  . sucralfate (CARAFATE) 1 GM/10ML suspension Take 10 mLs (1 g total) by mouth 4 (four) times daily -  with meals and at bedtime. (Patient not taking: Reported on 02/15/2017) 420 mL 1  . tamsulosin (FLOMAX) 0.4 MG CAPS capsule Take 1 capsule (0.4 mg total) by mouth daily after breakfast. 30 capsule 1  . warfarin (COUMADIN) 5 MG tablet Take one tablet daily. take as directed by physician. 90 tablet 1   No current facility-administered medications for this visit.     SURGICAL HISTORY:  Past Surgical History:  Procedure Laterality Date  . ABDOMINAL AORTIC ANEURYSM REPAIR     with reimplantation of renals   . CARDIAC CATHETERIZATION     2008  . CAROTID ENDARTERECTOMY Left Jan. 30, 2015   CEA  . CHOLECYSTECTOMY  2000   Gall Bladder  . COLONOSCOPY W/ BIOPSIES AND POLYPECTOMY     Hx: of  . CORONARY ARTERY BYPASS GRAFT     1995  . CORONARY STENT PLACEMENT     Hx: of  . ENDARTERECTOMY Left 07/19/2013   Procedure: ENDARTERECTOMY CAROTID;  Surgeon: Mal Misty, MD;  Location: [facility];  Service: Vascular;  Laterality: Left;  . GALLBLADDER SURGERY  2004   . JOINT REPLACEMENT      partial knee replacement on left 2002   . KNEE SURGERY    . ORIF PELVIC FRACTURE Left 08/25/2016   Procedure: OPEN REDUCTION INTERNAL FIXATION (ORIF) PELVIC FRACTURE; plate on front, SI screw on the back;  Surgeon: Altamese , MD;  Location: [facility];  Service: Orthopedics;  Laterality: Left;  . OTHER SURGICAL HISTORY     left subclavian stenosis surgery PTA 08/2006   . OTHER SURGICAL HISTORY     carotid surgery on right 2004   . TOTAL KNEE ARTHROPLASTY  04/29/2011   Procedure: TOTAL KNEE ARTHROPLASTY;  Surgeon: Gearlean Alf;  Location[facility];  Service: Orthopedics;  Laterality: Right;  . urinary retention    . VASCULAR SURGERY  AAA  . [facility]  . VIDEO BRONCHOSCOPY WITH ENDOBRONCHIAL ULTRASOUND [name] 09/09/2016   Procedure: VIDEO BRONCHOSCOPY WITH ENDOBRONCHIAL ULTRASOUND;  Surgeon: Grace Isaac, MD;  [facility];  Service: Thoracic;  Laterality: [name];    REVIEW OF SYSTEMS:  A comprehensive review of systems was negative except for: Constitutional: positive for fatigue   PHYSICAL EXAMINATION: General appearance: alert, cooperative, fatigued and no distress Head: Normocephalic, without obvious abnormality, atraumatic Neck: no adenopathy, no JVD, supple, symmetrical, trachea midline and thyroid not enlarged, symmetric, no tenderness/mass/nodules Lymph nodes: Cervical, supraclavicular, and axillary nodes normal. Resp: clear to auscultation bilaterally Back: symmetric, no curvature. ROM normal. No CVA tenderness. Cardio: regular rate and rhythm, S1, S2 normal, no murmur, click, rub or gallop GI: soft, non-tender; bowel sounds normal; no masses,  no organomegaly Extremities: extremities normal, atraumatic, no cyanosis or edema  ECOG PERFORMANCE STATUS: 1 - Symptomatic but completely ambulatory  Blood pressure (!) 122/57, pulse 64, temperature 97.6 F (36.4 C), temperature source Oral, resp. rate 18, height 5\' 7"  (1.702 m), weight 130 lb 14.4 oz (59.4 kg), SpO2 100  %.  LABORATORY DATA: Lab Results  Component Value Date   WBC 7.8 03/01/2017   HGB 13.4 03/01/2017   HCT 40.1 03/01/2017   MCV 88.4 03/01/2017   PLT 186 03/01/2017      Chemistry      Component Value Date/Time   NA 135 (L) 03/01/2017 1100   K 3.8 03/01/2017 1100   CL 100 (L) 10/06/2016 0611   CO2 24 03/01/2017 1100   BUN 13.3 03/01/2017 1100   CREATININE 0.7 03/01/2017 1100      Component Value Date/Time   CALCIUM 10.1 03/01/2017 1100   ALKPHOS 118 03/01/2017 1100   AST 21 03/01/2017 1100   ALT 17 03/01/2017 1100   BILITOT 0.36 03/01/2017 1100       RADIOGRAPHIC STUDIES: No results found.  ASSESSMENT AND PLAN:  This is a very pleasant 76 years old white male with a stage IIIa non-small cell lung cancer, adenocarcinoma with positive PDL 1 expression of 50%. The patient completed a course of concurrent chemoradiation with weekly carboplatin and paclitaxel status post 8 weeks and tolerated his treatment well except for mild dysphagia and odynophagia. He is currently undergoing consolidation immunotherapy with Imfinzi (Durvalumab) status post 3 cycles. The patient continues to tolerate his treatment fairly well with no concerning complaints. I recommended for him to proceed with cycle #4 today as scheduled. I will see him back for follow-up visit in 2 weeks for reevaluation before starting the next dose of his treatment. He was advised to call immediately if he has any concerning symptoms in the interval. The patient voices understanding of current disease status and treatment options and is in agreement with the current care plan. All questions were answered. The patient knows to call the clinic with any problems, questions or concerns. We can certainly see the patient much sooner if necessary. I spent 10 minutes counseling the patient face to face. The total time spent in the appointment was 15 minutes.  Disclaimer: This note was dictated with voice recognition software.  Similar sounding words can inadvertently be transcribed and may not be corrected upon review.

## 2017-03-01 NOTE — Telephone Encounter (Signed)
Scheduled appt per 9/12 los - patient to get new schedule next visit 9/26

## 2017-03-07 ENCOUNTER — Telehealth: Payer: Self-pay | Admitting: *Deleted

## 2017-03-07 DIAGNOSIS — D649 Anemia, unspecified: Secondary | ICD-10-CM | POA: Diagnosis not present

## 2017-03-07 DIAGNOSIS — Z466 Encounter for fitting and adjustment of urinary device: Secondary | ICD-10-CM | POA: Diagnosis not present

## 2017-03-07 DIAGNOSIS — I4891 Unspecified atrial fibrillation: Secondary | ICD-10-CM | POA: Diagnosis not present

## 2017-03-07 DIAGNOSIS — I739 Peripheral vascular disease, unspecified: Secondary | ICD-10-CM | POA: Diagnosis not present

## 2017-03-07 DIAGNOSIS — C8521 Mediastinal (thymic) large B-cell lymphoma, lymph nodes of head, face, and neck: Secondary | ICD-10-CM | POA: Diagnosis not present

## 2017-03-07 DIAGNOSIS — I2510 Atherosclerotic heart disease of native coronary artery without angina pectoris: Secondary | ICD-10-CM | POA: Diagnosis not present

## 2017-03-07 NOTE — Telephone Encounter (Signed)
[name] [name] from [facility] calling to report INR. INR today was 2.1. Takes 5mg  every day. Consult with dr Richardson Landry. Continue the same dose and recheck INR in 3 weeks per dr Richardson Landry. Maria at [facility] notified and verbalized understanding.

## 2017-03-08 DIAGNOSIS — S32811D Multiple fractures of pelvis with unstable disruption of pelvic ring, subsequent encounter for fracture with routine healing: Secondary | ICD-10-CM | POA: Diagnosis not present

## 2017-03-14 DIAGNOSIS — I2510 Atherosclerotic heart disease of native coronary artery without angina pectoris: Secondary | ICD-10-CM | POA: Diagnosis not present

## 2017-03-14 DIAGNOSIS — I4891 Unspecified atrial fibrillation: Secondary | ICD-10-CM | POA: Diagnosis not present

## 2017-03-14 DIAGNOSIS — I739 Peripheral vascular disease, unspecified: Secondary | ICD-10-CM | POA: Diagnosis not present

## 2017-03-14 DIAGNOSIS — D649 Anemia, unspecified: Secondary | ICD-10-CM | POA: Diagnosis not present

## 2017-03-14 DIAGNOSIS — C8521 Mediastinal (thymic) large B-cell lymphoma, lymph nodes of head, face, and neck: Secondary | ICD-10-CM | POA: Diagnosis not present

## 2017-03-14 DIAGNOSIS — Z466 Encounter for fitting and adjustment of urinary device: Secondary | ICD-10-CM | POA: Diagnosis not present

## 2017-03-15 ENCOUNTER — Ambulatory Visit (HOSPITAL_BASED_OUTPATIENT_CLINIC_OR_DEPARTMENT_OTHER): Payer: Medicare Other

## 2017-03-15 ENCOUNTER — Encounter: Payer: Self-pay | Admitting: Internal Medicine

## 2017-03-15 ENCOUNTER — Ambulatory Visit (HOSPITAL_BASED_OUTPATIENT_CLINIC_OR_DEPARTMENT_OTHER): Payer: Medicare Other | Admitting: Internal Medicine

## 2017-03-15 ENCOUNTER — Other Ambulatory Visit (HOSPITAL_BASED_OUTPATIENT_CLINIC_OR_DEPARTMENT_OTHER): Payer: Medicare Other

## 2017-03-15 ENCOUNTER — Telehealth: Payer: Self-pay | Admitting: Family Medicine

## 2017-03-15 VITALS — BP 129/58 | HR 63 | Temp 97.6°F | Resp 24 | Ht 67.0 in | Wt 129.6 lb

## 2017-03-15 DIAGNOSIS — C3411 Malignant neoplasm of upper lobe, right bronchus or lung: Secondary | ICD-10-CM | POA: Diagnosis not present

## 2017-03-15 DIAGNOSIS — C342 Malignant neoplasm of middle lobe, bronchus or lung: Secondary | ICD-10-CM

## 2017-03-15 DIAGNOSIS — Z5112 Encounter for antineoplastic immunotherapy: Secondary | ICD-10-CM

## 2017-03-15 LAB — COMPREHENSIVE METABOLIC PANEL
ALT: 12 U/L (ref 0–55)
ANION GAP: 8 meq/L (ref 3–11)
AST: 17 U/L (ref 5–34)
Albumin: 3 g/dL — ABNORMAL LOW (ref 3.5–5.0)
Alkaline Phosphatase: 113 U/L (ref 40–150)
BILIRUBIN TOTAL: 0.4 mg/dL (ref 0.20–1.20)
BUN: 11.9 mg/dL (ref 7.0–26.0)
CALCIUM: 9.6 mg/dL (ref 8.4–10.4)
CO2: 25 meq/L (ref 22–29)
CREATININE: 0.7 mg/dL (ref 0.7–1.3)
Chloride: 103 mEq/L (ref 98–109)
EGFR: 89 mL/min/{1.73_m2} — ABNORMAL LOW (ref >90)
Glucose: 133 mg/dl (ref 70–140)
Potassium: 3.9 mEq/L (ref 3.5–5.1)
Sodium: 135 mEq/L — ABNORMAL LOW (ref 136–145)
TOTAL PROTEIN: 6.7 g/dL (ref 6.4–8.3)

## 2017-03-15 LAB — CBC WITH DIFFERENTIAL/PLATELET
BASO%: 0.7 % (ref 0.0–2.0)
BASOS ABS: 0 10*3/uL (ref 0.0–0.1)
EOS ABS: 0.1 10*3/uL (ref 0.0–0.5)
EOS%: 1.1 % (ref 0.0–7.0)
HEMATOCRIT: 36.4 % — AB (ref 38.4–49.9)
HGB: 12.4 g/dL — ABNORMAL LOW (ref 13.0–17.1)
LYMPH#: 0.7 10*3/uL — AB (ref 0.9–3.3)
LYMPH%: 12.2 % — ABNORMAL LOW (ref 14.0–49.0)
MCH: 29.4 pg (ref 27.2–33.4)
MCHC: 34.2 g/dL (ref 32.0–36.0)
MCV: 85.9 fL (ref 79.3–98.0)
MONO#: 0.3 10*3/uL (ref 0.1–0.9)
MONO%: 5.2 % (ref 0.0–14.0)
NEUT#: 4.7 10*3/uL (ref 1.5–6.5)
NEUT%: 80.8 % — AB (ref 39.0–75.0)
PLATELETS: 162 10*3/uL (ref 140–400)
RBC: 4.23 10*6/uL (ref 4.20–5.82)
RDW: 16.6 % — ABNORMAL HIGH (ref 11.0–14.6)
WBC: 5.8 10*3/uL (ref 4.0–10.3)

## 2017-03-15 MED ORDER — SODIUM CHLORIDE 0.9 % IV SOLN
Freq: Once | INTRAVENOUS | Status: AC
  Administered 2017-03-15: 13:00:00 via INTRAVENOUS

## 2017-03-15 MED ORDER — SODIUM CHLORIDE 0.9 % IV SOLN
10.0000 mg/kg | Freq: Once | INTRAVENOUS | Status: AC
  Administered 2017-03-15: 620 mg via INTRAVENOUS
  Filled 2017-03-15: qty 2.4

## 2017-03-15 NOTE — Telephone Encounter (Signed)
Received fax from [facility] asking if you will refill patient's Xanax. Patient was originally prescribed medication in the hospital after a pelvic break. Pharmacy states family is requesting for agitation. Please advise? Please route response to clinical pool.

## 2017-03-15 NOTE — Telephone Encounter (Signed)
Ok plus three ref 

## 2017-03-15 NOTE — Patient Instructions (Signed)
[facility] Discharge Instructions for Patients Receiving Chemotherapy  Today you received the following chemotherapy agents Imfinzi.   To help prevent nausea and vomiting after your treatment, we encourage you to take your nausea medication as prescribed.  If you develop nausea and vomiting that is not controlled by your nausea medication, call the clinic.   BELOW ARE SYMPTOMS THAT SHOULD BE REPORTED IMMEDIATELY:  *FEVER GREATER THAN 100.5 F  *CHILLS WITH OR WITHOUT FEVER  NAUSEA AND VOMITING THAT IS NOT CONTROLLED WITH YOUR NAUSEA MEDICATION  *UNUSUAL SHORTNESS OF BREATH  *UNUSUAL BRUISING OR BLEEDING  TENDERNESS IN MOUTH AND THROAT WITH OR WITHOUT PRESENCE OF ULCERS  *URINARY PROBLEMS  *BOWEL PROBLEMS  UNUSUAL RASH Items with * indicate a potential emergency and should be followed up as soon as possible.  Feel free to call the clinic should you have any questions or concerns. The clinic phone number is [phone].  Please show the [facility] at check-in to the Emergency Department and triage nurse.

## 2017-03-15 NOTE — Telephone Encounter (Addendum)
Prescription called into [facility].

## 2017-03-15 NOTE — Progress Notes (Signed)
[facility] [phone]   [phone]  OFFICE PROGRESS NOTE  [name], MD [address] [place] [address]  DIAGNOSIS: Stage IIIA (T1b, N2, M0) non-small cell lung cancer, adenocarcinoma with positive PDL 1 expression 50% diagnosed in March 2018 and presented with right upper lobe lung nodule in addition to mediastinal lymphadenopathy.  PRIOR THERAPY: Course of concurrent chemoradiation with weekly carboplatin for AUC of 2 and paclitaxel 45 MG/M2. First dose of chemotherapy 10/17/2016. Status post 8 cycles. Last dose was given 12/05/2016 with partial response.[name]  CURRENT THERAPY: Consolidation treatment with immunotherapy with Imfinzi (Durvalumab) 10 MG/KG every 2 weeks. First dose 01/18/2017. Status post 4 cycles.  INTERVAL HISTORY: [name] [name] 76 y.o. male returns to the clinic today for follow-up visit accompanied by his wife. The patient is feeling fine today with no specific complaints. He continues to tolerate his immunotherapy fairly well. She denied having any chest pain, shortness of breath, cough or hemoptysis.he has no fever or chills. He denied having any nausea, vomiting, diarrhea or constipation. He is here for evaluation before starting cycle #5.  MEDICAL HISTORY: Past Medical History:  Diagnosis Date  . Adenocarcinoma of right lung, stage 3 ([facility]) 09/20/2016  . Atrial fibrillation ([facility])    no hx of reported at preop visit of 04/21/11   . CAD (coronary artery disease)   . Carotid artery occlusion    left carotid endarterectomy  . Chronic kidney disease    AAA repair with reimplant of renals   . CHRONIC OBSTRUCTIVE PULMONARY DISEASE    pt denied at visit of 04/21/11   . CORONARY ARTERY DISEASE   . Dehydration 11/15/2016  . Disorder of left sacroiliac joint 08/24/2016  . DIVERTICULOSIS OF COLON   . Encounter for antineoplastic chemotherapy 10/17/2016  . GERD   . H/O hiatal hernia   . Headache(784.0)    Hx: of  years of years  . HYPERTENSION   . HYPOTHYROIDISM   . JOINT EFFUSION, KNEE   . KNEE, ARTHRITIS, DEGEN./OSTEO    right knee   . Myocardial infarction ([facility])    1994   . NEPHROLITHIASIS, HX OF   . OSTEOPOROSIS   . Other dysphagia   . PERIPHERAL VASCULAR DISEASE    AAA - 1994 with reimplant of renals   . Peripheral vascular disease ([facility])    subclavian stenosis PTA - 3/08   . Pure hypercholesterolemia   . Renal artery stenosis ([facility])     ALLERGIES:  is allergic to neurontin [gabapentin]; imitrex [sumatriptan]; and lipitor [atorvastatin].  MEDICATIONS:  Current Outpatient Prescriptions  Medication Sig Dispense Refill  . acetaminophen (TYLENOL) 325 MG tablet Take 325-650 mg by mouth every 6 (six) hours as needed for headache (for pain).     [name] ALPRAZolam (XANAX) 0.25 MG tablet Take 1 tablet (0.25 mg total) by mouth 3 (three) times daily as needed for anxiety. 30 tablet 0  . bumetanide (BUMEX) 1 MG tablet Take 1 tablet (1 mg total) by mouth daily as needed (swelling). 30 tablet 1  . cefPROZIL (CEFZIL) 500 MG tablet Take 1 tablet (500 mg total) by mouth 2 (two) times daily. 14 tablet 0  . donepezil (ARICEPT) 5 MG tablet Take 1 tablet (5 mg total) by mouth at bedtime. 90 tablet 1  . lansoprazole (PREVACID) 15 MG capsule Take 1 capsule (15 mg total) by mouth daily. 30 capsule 5  . levothyroxine (SYNTHROID, LEVOTHROID) 112 MCG tablet Take 1 tablet (  112 mcg total) by mouth daily. 30 tablet 5  . linaclotide (LINZESS) 145 MCG CAPS capsule Take 1 capsule (145 mcg total) by mouth daily before breakfast. 30 capsule 11  . magic mouthwash w/lidocaine SOLN Composition: 80 cc each of Extra St. Maalox Plus, Diphenhydramine, and Nystatin. Plus 240 cc of 2% viscous lidocaine to make 480 cc. Swish and swallow 2 tsp four times per day. 480 mL 2  . metoprolol tartrate (LOPRESSOR) 25 MG tablet TAKE ONE-HALF TABLET BY MOUTH TWO TIMES DAILY. 30 tablet 5  . niacin (SLO-NIACIN) 500 MG tablet Take 3 tablets (1,500 mg  total) by mouth at bedtime. 30 tablet 0  . nitroGLYCERIN (NITROSTAT) 0.4 MG SL tablet Place 1 tablet (0.4 mg total) under the tongue every 5 (five) minutes as needed for chest pain. (Patient not taking: Reported on 02/15/2017) 25 tablet 10  . ondansetron (ZOFRAN) 8 MG tablet Take 1 tablet (8 mg total) by mouth every 8 (eight) hours as needed for nausea or vomiting. 20 tablet 0  . potassium chloride SA (K-DUR,KLOR-CON) 20 MEQ tablet Take 1 tablet (20 mEq total) by mouth daily. 30 tablet 11  . prochlorperazine (COMPAZINE) 10 MG tablet Take 1 tablet (10 mg total) by mouth every 6 (six) hours as needed for nausea or vomiting. (Patient not taking: Reported on 02/15/2017) 30 tablet 0  . rosuvastatin (CRESTOR) 20 MG tablet Take 1 tablet (20 mg total) by mouth daily. 30 tablet 5  . sucralfate (CARAFATE) 1 GM/10ML suspension Take 10 mLs (1 g total) by mouth 4 (four) times daily -  with meals and at bedtime. (Patient not taking: Reported on 02/15/2017) 420 mL 1  . tamsulosin (FLOMAX) 0.4 MG CAPS capsule Take 1 capsule (0.4 mg total) by mouth daily after breakfast. 30 capsule 1  . warfarin (COUMADIN) 5 MG tablet Take one tablet daily. take as directed by physician. 90 tablet 1   No current facility-administered medications for this visit.     SURGICAL HISTORY:  Past Surgical History:  Procedure Laterality Date  . ABDOMINAL AORTIC ANEURYSM REPAIR     with reimplantation of renals   . CARDIAC CATHETERIZATION     2008  . CAROTID ENDARTERECTOMY Left Jan. 30, 2015   CEA  . CHOLECYSTECTOMY  2000   Gall Bladder  . COLONOSCOPY W/ BIOPSIES AND POLYPECTOMY     Hx: of  . CORONARY ARTERY BYPASS GRAFT     1995  . CORONARY STENT PLACEMENT     Hx: of  . ENDARTERECTOMY Left 07/19/2013   Procedure: ENDARTERECTOMY CAROTID;  Surgeon: Mal Misty, MD;  Location: [facility];  Service: Vascular;  Laterality: Left;  . GALLBLADDER SURGERY  2004   . JOINT REPLACEMENT     partial knee replacement on left 2002   . KNEE  SURGERY    . ORIF PELVIC FRACTURE Left 08/25/2016   Procedure: OPEN REDUCTION INTERNAL FIXATION (ORIF) PELVIC FRACTURE; plate on front, SI screw on the back;  Surgeon: Altamese Forbestown, MD;  Location: [facility];  Service: Orthopedics;  Laterality: Left;  . OTHER SURGICAL HISTORY     left subclavian stenosis surgery PTA 08/2006   . OTHER SURGICAL HISTORY     carotid surgery on right 2004   . TOTAL KNEE ARTHROPLASTY  04/29/2011   Procedure: TOTAL KNEE ARTHROPLASTY;  Surgeon: Gearlean Alf;  Location: [facility];  Service: Orthopedics;  Laterality: Right;  . urinary retention    . VASCULAR SURGERY     AAA  . VASECTOMY  [place] WITH ENDOBRONCHIAL ULTRASOUND [name] 09/09/2016   Procedure: VIDEO BRONCHOSCOPY WITH ENDOBRONCHIAL ULTRASOUND;  Surgeon: Grace Isaac, MD;  Location: [facility];  Service: Thoracic;  Laterality: [name];    REVIEW OF SYSTEMS:  A comprehensive review of systems was negative except for: Constitutional: positive for fatigue   PHYSICAL EXAMINATION: General appearance: alert, cooperative, fatigued and no distress Head: Normocephalic, without obvious abnormality, atraumatic Neck: no adenopathy, no JVD, supple, symmetrical, trachea midline and thyroid not enlarged, symmetric, no tenderness/mass/nodules Lymph nodes: Cervical, supraclavicular, and axillary nodes normal. Resp: clear to auscultation bilaterally Back: symmetric, no curvature. ROM normal. No CVA tenderness. Cardio: regular rate and rhythm, S1, S2 normal, no murmur, click, rub or gallop GI: soft, non-tender; bowel sounds normal; no masses,  no organomegaly Extremities: extremities normal, atraumatic, no cyanosis or edema  ECOG PERFORMANCE STATUS: 1 - Symptomatic but completely ambulatory  Blood pressure (!) 129/58, pulse 63, temperature 97.6 F (36.4 C), temperature source Oral, resp. rate (!) 24, height 5\' 7"  (1.702 m), weight 129 lb 9.6 oz (58.8 kg), SpO2 100 %.  LABORATORY DATA: Lab Results  Component Value  Date   WBC 5.8 03/15/2017   HGB 12.4 (L) 03/15/2017   HCT 36.4 (L) 03/15/2017   MCV 85.9 03/15/2017   PLT 162 03/15/2017      Chemistry      Component Value Date/Time   NA 135 (L) 03/01/2017 1100   K 3.8 03/01/2017 1100   CL 100 (L) 10/06/2016 0611   CO2 24 03/01/2017 1100   BUN 13.3 03/01/2017 1100   CREATININE 0.7 03/01/2017 1100      Component Value Date/Time   CALCIUM 10.1 03/01/2017 1100   ALKPHOS 118 03/01/2017 1100   AST 21 03/01/2017 1100   ALT 17 03/01/2017 1100   BILITOT 0.36 03/01/2017 1100       RADIOGRAPHIC STUDIES: No results found.  ASSESSMENT AND PLAN:  This is a very pleasant 76 years old white male with a stage IIIa non-small cell lung cancer, adenocarcinoma with positive PDL 1 expression of 50%. The patient completed a course of concurrent chemoradiation with weekly carboplatin and paclitaxel status post 8 weeks and tolerated his treatment well except for mild dysphagia and odynophagia. He is currently undergoing consolidation immunotherapy with Imfinzi (Durvalumab) status post 4 cycles. He tolerated cycle #4 fairly well with no specific complaints. I recommended for him to proceed with cycle #5 today as scheduled. He will receive his flu shot by his primary care physician. The patient would come back for follow-up visit in 2 weeks for evaluation before starting cycle #6. He was advised to call immediately if he has any concerning symptoms in the interval. The patient voices understanding of current disease status and treatment options and is in agreement with the current care plan. All questions were answered. The patient knows to call the clinic with any problems, questions or concerns. We can certainly see the patient much sooner if necessary. I spent 10 minutes counseling the patient face to face. The total time spent in the appointment was 15 minutes.  Disclaimer: This note was dictated with voice recognition software. Similar sounding words can  inadvertently be transcribed and may not be corrected upon review.

## 2017-03-20 ENCOUNTER — Telehealth: Payer: Self-pay | Admitting: Internal Medicine

## 2017-03-20 ENCOUNTER — Encounter: Payer: Self-pay | Admitting: Neurology

## 2017-03-20 ENCOUNTER — Ambulatory Visit (INDEPENDENT_AMBULATORY_CARE_PROVIDER_SITE_OTHER): Payer: Medicare Other | Admitting: Neurology

## 2017-03-20 VITALS — BP 100/58 | HR 72 | Ht 68.0 in | Wt 130.0 lb

## 2017-03-20 DIAGNOSIS — I63031 Cerebral infarction due to thrombosis of right carotid artery: Secondary | ICD-10-CM

## 2017-03-20 DIAGNOSIS — I6529 Occlusion and stenosis of unspecified carotid artery: Secondary | ICD-10-CM | POA: Diagnosis not present

## 2017-03-20 NOTE — Telephone Encounter (Signed)
Scheduled appt per 9/26 los - patient to get new schedule next visit.

## 2017-03-20 NOTE — Progress Notes (Signed)
NEUROLOGY FOLLOW UP OFFICE NOTE  [name] [name] [id number] 78-08-1940  HISTORY OF PRESENT ILLNESS: I had the pleasure of seeing [name] [name] in follow-up in the neurology clinic on 03/20/2017.  The patient was last seen 3 months ago for worsening memory after bilateral strokes. He is again accompanied by his wife who helps supplement the history today.  Records and images were personally reviewed where available.  Since his last visit, he had a CTA head and neck which showed 80% diameter stenosis of the distal right common carotid artery, irregular plaque with ulceration, possible thrombus, right ICA patent, moderate narrowing of right external carotid artery. Left CEA patent without significant stenosis, there is extensive atherosclerotic disease in the left common carotid artery which is heavily calcified with 50-60% diameter long segment stenosis. No significant intracranial stenosis. His echo showed an EF of 24-09%, grade 1 diastolic dysfunction, left atrium upper limits of normal. He has seen vascular surgeon Dr. Donnetta Hutching, and after discussion with Dr. Julien Nordmann, patient has stage III lung cancer s/p chemotherapy, currently on immunotherapy. He has multiple other medical issues with a very debilitated state. It was felt most appropriate to allow him to continue to recover from the stroke and become adjusted to cancer therapy, then determine functional status in several months to determine if CEA is appropriate. He last saw Dr. Julien Nordmann in September and continues with immunotherapy s/p 4 cycles.   He and his wife report that his memory is the same, he still has occasional confusion, but does not seem worse per wife. He denies any headaches, dizziness, vision changes/loss of vision, focal numbness/tingling/weakness, no further falls. He was started on Donepezil 5mg  daily by his PCP recently. He reports some diarrhea but this appears to have improved, he only had a little amount of loose stool this  morning.  HPI 12/20/2016: This is a pleasant 76 yo RH man with a history of CAD s/p CABG in 1995, abdominal aortic aneurysm, carotid stenosis s/p bilateral CEA, diverticulosis, atrial fibrillation, hypertension, hiatal hernia, hypothyroidism, osteoporosis, dyslipidemia, renal artery stenosis and long history of smoking but quit in 1994. He was admitted to [facility] last 08/22/2016 after a fall off a 20-feet ladder. He sustained a pelvic fracture and transverse process fractrues L2-S1 and underwent surgery. As part of his evaluation, he had a CT chest/abdomen/pelvis which showed pulmonary densities. Bronchoscopy was done with biopsy showing malignant cells consistent with adenocarcinoma. He developed a DVT and was started on Coumadin. His head CT without contrast showed abnormal anterior left frontal lobe with a suspicious for 2 cm brain mass with mild surrounding vasogenic edema. Follow-up brain MRI done 09/02/2016 showed a left frontal lesion with T1 shortening and susceptibility blooming without enhancement, favoring subacute to chronic hemorrhagic infarct or cortical contusion. There were small foci of restricted diffusion in the right frontal and parietal lobes in a watershed distribution consistent with acute/early subacute infarcts. Sequelae of trauma with shear injury was also possible, but no hemorrhage was seen. There was a small left cerebral convexity subdural hematoma without mass effect. A repeat MRI brain with and without contrast done 10/15/16 showed new areas of acute ischemia in the right hemisphere not seen on previous scan, consistent with ongoing hypoperfusion and multifocal watershed infarcts. The changes on the left frontal region did not show features of metastatic disease, more consistent with evolution of hemorrhagic infarct. There was concern for proximal high-grade stenosis, consider CTA head and neck for further evaluation.  Since his hospital stay, he has finished  chemotherapy and radiation  therapy. He developed urinary retention and has a foley catheter with follow-up with Urology. He has not seen Neurology until today, and his wife reports confusion where he would not remember things he had always done, asking a lot of questions because he could not understand what she was trying to tell him. He could not figure out their remote control of 10 years, he states he has trouble seeing the little numbers. He had previously been in charge of bills, his wife took after after hospitalization. She manages his medications, he has always been on multiple medications in the past, but is having a hard time managing them now. He had been driving prior to the fall without getting lost, but is not driving at this time. His wife helps him to the shower but he can bathe and dress himself. She has noticed more irritability, no paranoia. He was having "crazy things and imagining things" on gabapentin, resolved with discontinuation of medication. His mother and 3 sisters had Alzheimer's disease. His mother had a history of stroke.  PAST MEDICAL HISTORY: Past Medical History:  Diagnosis Date  . Adenocarcinoma of right lung, stage 3 ([place]) 09/20/2016  . Atrial fibrillation ([place])    no hx of reported at preop visit of 04/21/11   . CAD (coronary artery disease)   . Carotid artery occlusion    left carotid endarterectomy  . Chronic kidney disease    AAA repair with reimplant of renals   . CHRONIC OBSTRUCTIVE PULMONARY DISEASE    pt denied at visit of 04/21/11   . CORONARY ARTERY DISEASE   . Dehydration 11/15/2016  . Disorder of left sacroiliac joint 08/24/2016  . DIVERTICULOSIS OF COLON   . Encounter for antineoplastic chemotherapy 10/17/2016  . GERD   . H/O hiatal hernia   . Headache(784.0)    Hx: of years of years  . HYPERTENSION   . HYPOTHYROIDISM   . JOINT EFFUSION, KNEE   . KNEE, ARTHRITIS, DEGEN./OSTEO    right knee   . Myocardial infarction ([place])    1994   . NEPHROLITHIASIS, HX OF   .  OSTEOPOROSIS   . Other dysphagia   . PERIPHERAL VASCULAR DISEASE    AAA - 1994 with reimplant of renals   . Peripheral vascular disease ([place])    subclavian stenosis PTA - 3/08   . Pure hypercholesterolemia   . Renal artery stenosis [facility])     MEDICATIONS: Current Outpatient Prescriptions on File Prior to Visit  Medication Sig Dispense Refill  . acetaminophen (TYLENOL) 325 MG tablet Take 325-650 mg by mouth every 6 (six) hours as needed for headache (for pain).     [name] ALPRAZolam (XANAX) 0.25 MG tablet Take 1 tablet (0.25 mg total) by mouth 3 (three) times daily as needed for anxiety. 30 tablet 0  . bumetanide (BUMEX) 1 MG tablet Take 1 tablet (1 mg total) by mouth daily as needed (swelling). 30 tablet 1  . donepezil (ARICEPT) 5 MG tablet Take 1 tablet (5 mg total) by mouth at bedtime. 90 tablet 1  . lansoprazole (PREVACID) 15 MG capsule Take 1 capsule (15 mg total) by mouth daily. 30 capsule 5  . levothyroxine (SYNTHROID, LEVOTHROID) 112 MCG tablet Take 1 tablet (112 mcg total) by mouth daily. 30 tablet 5  . linaclotide (LINZESS) 145 MCG CAPS capsule Take 1 capsule (145 mcg total) by mouth daily before breakfast. 30 capsule 11  . metoprolol tartrate (LOPRESSOR) 25 MG tablet TAKE ONE-HALF TABLET BY MOUTH  TWO TIMES DAILY. 30 tablet 5  . niacin (SLO-NIACIN) 500 MG tablet Take 3 tablets (1,500 mg total) by mouth at bedtime. 30 tablet 0  . ondansetron (ZOFRAN) 8 MG tablet Take 1 tablet (8 mg total) by mouth every 8 (eight) hours as needed for nausea or vomiting. 20 tablet 0  . potassium chloride SA (K-DUR,KLOR-CON) 20 MEQ tablet Take 1 tablet (20 mEq total) by mouth daily. 30 tablet 11  . prochlorperazine (COMPAZINE) 10 MG tablet Take 1 tablet (10 mg total) by mouth every 6 (six) hours as needed for nausea or vomiting. 30 tablet 0  . rosuvastatin (CRESTOR) 20 MG tablet Take 1 tablet (20 mg total) by mouth daily. 30 tablet 5  . sucralfate (CARAFATE) 1 GM/10ML suspension Take 10 mLs (1 g total) by  mouth 4 (four) times daily -  with meals and at bedtime. 420 mL 1  . tamsulosin (FLOMAX) 0.4 MG CAPS capsule Take 1 capsule (0.4 mg total) by mouth daily after breakfast. 30 capsule 1  . warfarin (COUMADIN) 5 MG tablet Take one tablet daily. take as directed by physician. 90 tablet 1  . magic mouthwash w/lidocaine SOLN Composition: 80 cc each of Extra St. Maalox Plus, Diphenhydramine, and Nystatin. Plus 240 cc of 2% viscous lidocaine to make 480 cc. Swish and swallow 2 tsp four times per day. (Patient not taking: Reported on 03/20/2017) 480 mL 2  . nitroGLYCERIN (NITROSTAT) 0.4 MG SL tablet Place 1 tablet (0.4 mg total) under the tongue every 5 (five) minutes as needed for chest pain. (Patient not taking: Reported on 03/20/2017) 25 tablet 10   No current facility-administered medications on file prior to visit.     ALLERGIES: Allergies  Allergen Reactions  . Neurontin [Gabapentin] Other (See Comments)    Caused confusion  . Imitrex [Sumatriptan] Nausea And Vomiting and Other (See Comments)    "made me like I was having a stoke"  . Lipitor [Atorvastatin] Nausea And Vomiting and Other (See Comments)    INTOLERANCE > MYALGIAS "couldn't get out of the bed ( pt had to crawl out of bed ) I hurt so bad"    FAMILY HISTORY: Family History  Problem Relation Age of Onset  . Hypertension Mother   . Heart disease Father   . Heart attack Father   . Diabetes Sister   . Cancer Brother 45       Lung  . Diabetes Sister     SOCIAL HISTORY: Social History   Social History  . Marital status: Married    Spouse name: [name]  . Number of children: [name]  . Years of education: [name]   Occupational History  . Not on file.   Social History Main Topics  . Smoking status: Former Smoker    Types: Cigarettes    Quit date: 06/20/1992  . Smokeless tobacco: Never Used  . Alcohol use No  . Drug use: No  . Sexual activity: Not on file   Other Topics Concern  . Not on file   Social History Narrative  . No  narrative on file    REVIEW OF SYSTEMS: Constitutional: No fevers, chills, or sweats, no generalized fatigue, change in appetite Eyes: No visual changes, double vision, eye pain Ear, nose and throat: No hearing loss, ear pain, nasal congestion, sore throat Cardiovascular: No chest pain, palpitations Respiratory:  No shortness of breath at rest or with exertion, wheezes GastrointestinaI: No nausea, vomiting, diarrhea, abdominal pain, fecal incontinence Genitourinary:  No dysuria, urinary retention or  frequency Musculoskeletal:  No neck pain, back pain Integumentary: No rash, pruritus, skin lesions Neurological: as above Psychiatric: No depression, insomnia, anxiety Endocrine: No palpitations, fatigue, diaphoresis, mood swings, change in appetite, change in weight, increased thirst Hematologic/Lymphatic:  No anemia, purpura, petechiae. Allergic/Immunologic: no itchy/runny eyes, nasal congestion, recent allergic reactions, rashes  PHYSICAL EXAM: Vitals:   03/20/17 1325  BP: (!) 100/58  Pulse: 72  SpO2: 93%   General: No acute distress Head:  Normocephalic/atraumatic Neck: supple, no paraspinal tenderness, full range of motion Heart:  Regular rate and rhythm Lungs:  Clear to auscultation bilaterally Back: No paraspinal tenderness Skin/Extremities: No rash, no edema Neurological Exam: Mental status: alert and oriented to person, place, and month/day of week. States year is 23. No dysarthria or aphasia, Fund of knowledge is appropriate.  Recent and remote memory are impaired. 0/3 delayed recall. 3/5 spelling WORLD backward. Attention and concentration are normal.    Able to name objects and repeat phrases.  Cranial nerves: CN I: not tested CN II: pupils equal, round and reactive to light, visual fields intact, fundi unremarkable. CN III, IV, VI:  full range of motion, no nystagmus, no ptosis CN V: facial sensation intact CN VII: upper and lower face symmetric CN VIII: hearing  intact to finger rub CN IX, X: gag intact, uvula midline CN XI: sternocleidomastoid and trapezius muscles intact CN XII: tongue midline Bulk & Tone: normal, no fasciculations. Motor: 5/5 throughout with no pronator drift. Sensation: intact to light touch.  No extinction to double simultaneous stimulation.  Romberg test negative Deep Tendon Reflexes: +1 throughout, no ankle clonus Plantar responses: mute bilaterally Cerebellar: no incoordination on finger to nose testing Gait: narrow-based and steady, uses cane for balance, no ataxia   Tremor: none  IMPRESSION: This is a pleasant 76 yo RH man with a multiple medical issues including CAD s/p CABG in 1995, abdominal aortic aneurysm, carotid stenosis s/p bilateral CEA, diverticulosis, atrial fibrillation, hypertension, hiatal hernia, hypothyroidism, osteoporosis, dyslipidemia, renal artery stenosis, who fell off a 20-foot ladder last March 2018 sustaining pelvic and transverse process fractures. As part of his evaluation, he had a CT C/A/P which showed pulmonary densities with biopsy consistent with adenocarcinoma. He underwent chemotherapy and radiation, and is currently doing immunotherapy. During his hospitalization, his brain imaging showed bilateral strokes, with evolving left frontal subacute to chronic hemorrhagic infarct or cortical contusion, and watershed infarcts in the right hemisphere concerning for proximal high grade carotid stenosis. CTA neck showed 80% stenosis of the distal right common carotid artery with irregular plaque with ulceration. He has been evaluated by vascular surgery and due to multiple medical issues, he will be re-evaluated if surgery is an option over the next few months. Neurological he has been stable, no new deficits, he has no focal deficits on exam, main complaint by wife is memory. MMSE in July 2018 was 20/30, he has been started on Donepezil 5mg  daily. Continue follow-up with Oncology and Vascular surgery. He and  his wife know to go to the ER immediately for any change in symptoms. He will follow-up in 6 months and knows to call for any changes.   Thank you for allowing me to participate in his care.  Please do not hesitate to call for any questions or concerns.  The duration of this appointment visit was 25 minutes of face-to-face time with the patient.  Greater than 50% of this time was spent in counseling, explanation of diagnosis, planning of further management, and coordination of care.  Ellouise Newer, M.D.   CC: Dr. Wolfgang Phoenix, Dr. Julien Nordmann, Dr. Donnetta Hutching

## 2017-03-20 NOTE — Patient Instructions (Signed)
1. Continue all your medications 2. Follow-up with Dr. Donnetta Hutching and Dr. Julien Nordmann as scheduled 3. For any sudden change in symptoms, go to ER immediately 4. Follow-up in 6 months, call for any changes

## 2017-03-23 DIAGNOSIS — R269 Unspecified abnormalities of gait and mobility: Secondary | ICD-10-CM | POA: Diagnosis not present

## 2017-03-23 DIAGNOSIS — M62559 Muscle wasting and atrophy, not elsewhere classified, unspecified thigh: Secondary | ICD-10-CM | POA: Diagnosis not present

## 2017-03-23 DIAGNOSIS — M25652 Stiffness of left hip, not elsewhere classified: Secondary | ICD-10-CM | POA: Diagnosis not present

## 2017-03-23 DIAGNOSIS — M25651 Stiffness of right hip, not elsewhere classified: Secondary | ICD-10-CM | POA: Diagnosis not present

## 2017-03-27 DIAGNOSIS — R269 Unspecified abnormalities of gait and mobility: Secondary | ICD-10-CM | POA: Diagnosis not present

## 2017-03-27 DIAGNOSIS — M62559 Muscle wasting and atrophy, not elsewhere classified, unspecified thigh: Secondary | ICD-10-CM | POA: Diagnosis not present

## 2017-03-27 DIAGNOSIS — M25652 Stiffness of left hip, not elsewhere classified: Secondary | ICD-10-CM | POA: Diagnosis not present

## 2017-03-27 DIAGNOSIS — M25651 Stiffness of right hip, not elsewhere classified: Secondary | ICD-10-CM | POA: Diagnosis not present

## 2017-03-29 ENCOUNTER — Other Ambulatory Visit (HOSPITAL_BASED_OUTPATIENT_CLINIC_OR_DEPARTMENT_OTHER): Payer: Medicare Other

## 2017-03-29 ENCOUNTER — Ambulatory Visit (HOSPITAL_BASED_OUTPATIENT_CLINIC_OR_DEPARTMENT_OTHER): Payer: Medicare Other

## 2017-03-29 ENCOUNTER — Encounter: Payer: Self-pay | Admitting: Oncology

## 2017-03-29 ENCOUNTER — Ambulatory Visit (HOSPITAL_BASED_OUTPATIENT_CLINIC_OR_DEPARTMENT_OTHER): Payer: Medicare Other | Admitting: Oncology

## 2017-03-29 VITALS — BP 130/69 | HR 65 | Temp 97.6°F | Resp 18 | Ht 68.0 in | Wt 132.6 lb

## 2017-03-29 DIAGNOSIS — Z5112 Encounter for antineoplastic immunotherapy: Secondary | ICD-10-CM

## 2017-03-29 DIAGNOSIS — C342 Malignant neoplasm of middle lobe, bronchus or lung: Secondary | ICD-10-CM

## 2017-03-29 DIAGNOSIS — Z23 Encounter for immunization: Secondary | ICD-10-CM | POA: Diagnosis present

## 2017-03-29 LAB — CBC WITH DIFFERENTIAL/PLATELET
BASO%: 0.4 % (ref 0.0–2.0)
BASOS ABS: 0 10*3/uL (ref 0.0–0.1)
EOS ABS: 0.1 10*3/uL (ref 0.0–0.5)
EOS%: 2.5 % (ref 0.0–7.0)
HEMATOCRIT: 37.1 % — AB (ref 38.4–49.9)
HGB: 12.6 g/dL — ABNORMAL LOW (ref 13.0–17.1)
LYMPH#: 1.1 10*3/uL (ref 0.9–3.3)
LYMPH%: 21.9 % (ref 14.0–49.0)
MCH: 29.2 pg (ref 27.2–33.4)
MCHC: 34 g/dL (ref 32.0–36.0)
MCV: 85.9 fL (ref 79.3–98.0)
MONO#: 0.3 10*3/uL (ref 0.1–0.9)
MONO%: 6.2 % (ref 0.0–14.0)
NEUT#: 3.3 10*3/uL (ref 1.5–6.5)
NEUT%: 69 % (ref 39.0–75.0)
PLATELETS: 138 10*3/uL — AB (ref 140–400)
RBC: 4.32 10*6/uL (ref 4.20–5.82)
RDW: 15.4 % — ABNORMAL HIGH (ref 11.0–14.6)
WBC: 4.8 10*3/uL (ref 4.0–10.3)

## 2017-03-29 LAB — COMPREHENSIVE METABOLIC PANEL
ALT: 14 U/L (ref 0–55)
ANION GAP: 8 meq/L (ref 3–11)
AST: 19 U/L (ref 5–34)
Albumin: 3.2 g/dL — ABNORMAL LOW (ref 3.5–5.0)
Alkaline Phosphatase: 120 U/L (ref 40–150)
BUN: 12.5 mg/dL (ref 7.0–26.0)
CALCIUM: 9.5 mg/dL (ref 8.4–10.4)
CHLORIDE: 102 meq/L (ref 98–109)
CO2: 25 mEq/L (ref 22–29)
CREATININE: 0.7 mg/dL (ref 0.7–1.3)
Glucose: 104 mg/dl (ref 70–140)
POTASSIUM: 3.9 meq/L (ref 3.5–5.1)
Sodium: 135 mEq/L — ABNORMAL LOW (ref 136–145)
Total Bilirubin: 0.41 mg/dL (ref 0.20–1.20)
Total Protein: 6.7 g/dL (ref 6.4–8.3)

## 2017-03-29 MED ORDER — SODIUM CHLORIDE 0.9 % IV SOLN
10.0000 mg/kg | Freq: Once | INTRAVENOUS | Status: AC
  Administered 2017-03-29: 620 mg via INTRAVENOUS
  Filled 2017-03-29: qty 2.4

## 2017-03-29 MED ORDER — SODIUM CHLORIDE 0.9 % IV SOLN
Freq: Once | INTRAVENOUS | Status: AC
  Administered 2017-03-29: 12:00:00 via INTRAVENOUS

## 2017-03-29 MED ORDER — INFLUENZA VAC SPLIT HIGH-DOSE 0.5 ML IM SUSY
0.5000 mL | PREFILLED_SYRINGE | Freq: Once | INTRAMUSCULAR | Status: AC
  Administered 2017-03-29: 0.5 mL via INTRAMUSCULAR
  Filled 2017-03-29: qty 0.5

## 2017-03-29 NOTE — Assessment & Plan Note (Signed)
This is a very pleasant 76 year old white male with a stage IIIa non-small cell lung cancer, adenocarcinoma with positive PDL 1 expression of 50%. The patient completed a course of concurrent chemoradiation with weekly carboplatin and paclitaxel status post 8 weeks and tolerated his treatment well except for mild dysphagia and odynophagia. He is currently undergoing consolidation immunotherapy with Imfinzi (Durvalumab) status post 5 cycles. He tolerated cycle #5 fairly well with no specific complaints. I recommended for him to proceed with cycle #6 today as scheduled. He will have a restaging CT scan of the chest prior to his next cycle of immunotherapy.  He will receive his flu shot in the infusion room today per his request.  The patient would come back for follow-up visit in 2 weeks for evaluation before starting cycle #7 and to review his restaging CT scan results.  He was advised to call immediately if he has any concerning symptoms in the interval. The patient voices understanding of current disease status and treatment options and is in agreement with the current care plan. All questions were answered. The patient knows to call the clinic with any problems, questions or concerns. We can certainly see the patient much sooner if necessary.

## 2017-03-29 NOTE — Patient Instructions (Signed)
[facility] Discharge Instructions for Patients Receiving Chemotherapy  Today you received the following chemotherapy agents imfinzi   To help prevent nausea and vomiting after your treatment, we encourage you to take your nausea medication as directed  If you develop nausea and vomiting that is not controlled by your nausea medication, call the clinic.   BELOW ARE SYMPTOMS THAT SHOULD BE REPORTED IMMEDIATELY:  *FEVER GREATER THAN 100.5 F  *CHILLS WITH OR WITHOUT FEVER  NAUSEA AND VOMITING THAT IS NOT CONTROLLED WITH YOUR NAUSEA MEDICATION  *UNUSUAL SHORTNESS OF BREATH  *UNUSUAL BRUISING OR BLEEDING  TENDERNESS IN MOUTH AND THROAT WITH OR WITHOUT PRESENCE OF ULCERS  *URINARY PROBLEMS  *BOWEL PROBLEMS  UNUSUAL RASH Items with * indicate a potential emergency and should be followed up as soon as possible.  Feel free to call the clinic you have any questions or concerns. The clinic phone number is [phone].

## 2017-03-29 NOTE — Progress Notes (Signed)
[facility] Follow up:    [name] Kirschner, MD [place] [address]   DIAGNOSIS: Stage IIIA (T1b, N2, M0) non-small cell lung cancer, adenocarcinoma with positive PDL 1 expression 50% diagnosed in March 2018 and presented with right upper lobe lung nodule in addition to mediastinal lymphadenopathy.  SUMMARY OF ONCOLOGIC HISTORY:  No history exists.   PRIOR THERAPY: Course of concurrent chemoradiation with weekly carboplatin for AUC of 2 and paclitaxel 45 MG/M2. First dose of chemotherapy 10/17/2016. Status post 8 cycles. Last dose was given 12/05/2016 with partial response.[name]  CURRENT THERAPY: Consolidation treatment with immunotherapy with Imfinzi (Durvalumab) 10 MG/KG every 2 weeks. First dose 01/18/2017. Status post 4 cycles.  INTERVAL HISTORY: [name] [name] 76 y.o. male returns for a routine follow-up accompanied by his wife. The patient is feeling fine today with no specific complaints. He continues to tolerate his immunotherapy fairly well. She denied having any chest pain, shortness of breath, cough or hemoptysis.he has no fever or chills. He denied having any nausea, vomiting, diarrhea or constipation. He is here for evaluation before starting cycle #6.   Patient Active Problem List   Diagnosis Date Noted  . Dementia, neurological 02/14/2017  . Encounter for antineoplastic immunotherapy 01/02/2017  . Goals of care, counseling/discussion 01/02/2017  . Cerebrovascular accident (CVA) due to thrombosis of right carotid artery ([place]) 12/20/2016  . Cerebrovascular accident (CVA) due to embolism of cerebral artery ([place]) 12/20/2016  . Memory loss 12/20/2016  . Dehydration 11/15/2016  . Encounter for antineoplastic chemotherapy 10/17/2016  . Malnutrition of moderate degree 10/06/2016  . Urinary tract infection 10/03/2016  . T1b N2 M0 disease (stage IIIA).adenocarcinoma of the right middle lobe of lung ([place]) 09/20/2016  . Peripheral edema    . Slow transit constipation   . Acute lower UTI   . Confusion   . SOB (shortness of breath) 09/02/2016  . Urinary incontinence   . Abnormal urinalysis   . Leukocytosis   . HCAP (healthcare-associated pneumonia)   . Hypoalbuminemia due to protein-calorie malnutrition ([place])   . Hypokalemia   . Hyponatremia   . Urinary retention   . Sleep disturbance   . Acute bilateral deep vein thrombosis (DVT) of popliteal veins (HCC)   . Brain mass 08/31/2016  . Debility   . Benign essential HTN   . Scrotal edema   . Hyperlipidemia   . Constipation due to pain medication   . Fall from height of greater than 3 feet   . Acute blood loss anemia   . Lung mass   . Post-operative pain   . PVD (peripheral vascular disease) ([place])   . Coronary artery disease involving coronary bypass graft of native heart without angina pectoris   . PAF (paroxysmal atrial fibrillation) ([place])   . Disorder of left sacroiliac joint 08/24/2016  . Fall 08/22/2016  . Closed pelvic ring fracture ([place])   . Carotid artery stenosis, asymptomatic 08/12/2014  . Ejection murmur 07/03/2014  . Aftercare following surgery of the circulatory system, [place] 02/11/2014  . Weakness-Abdomin and Bilateral Thigh / Leg 02/11/2014  . Carotid stenosis 07/19/2013  . Occlusion and stenosis of carotid artery without mention of cerebral infarction 07/16/2013  . Impaired fasting glucose 07/14/2013  . Knee pain 05/23/2011  . Stiffness of joint, not elsewhere classified, lower leg 05/23/2011  . Muscle weakness (generalized) 05/23/2011  . Difficulty in walking(719.7) 05/23/2011  . Bruit 11/19/2010  . AAA (abdominal aortic aneurysm) ([place]) 11/19/2010  . OTHER  DYSPHAGIA 04/27/2010  . ATRIAL FIBRILLATION 12/10/2008  . JOINT EFFUSION, KNEE 01/14/2008  . KNEE PAIN 01/14/2008  . KNEE, ARTHRITIS, DEGEN./OSTEO 12/11/2007  . Hypothyroidism 10/10/2007  . Pure hypercholesterolemia 10/10/2007  . Essential hypertension 10/10/2007  . Coronary  atherosclerosis 10/10/2007  . Peripheral vascular disease ([place]) 10/10/2007  . CHRONIC OBSTRUCTIVE PULMONARY DISEASE 10/10/2007  . GERD 10/10/2007  . DIVERTICULOSIS OF COLON 10/10/2007  . OSTEOPOROSIS 10/10/2007  . NEPHROLITHIASIS, HX OF 10/10/2007  . Personal history of surgery to heart and great vessels, presenting hazards to health 10/10/2007  . CHOLECYSTECTOMY, HX OF 10/10/2007  . CORONARY ARTERY BYPASS GRAFT, HX OF 10/10/2007    is allergic to neurontin [gabapentin]; imitrex [sumatriptan]; and lipitor [atorvastatin].  MEDICAL HISTORY: Past Medical History:  Diagnosis Date  . Adenocarcinoma of right lung, stage 3 ([place]) 09/20/2016  . Atrial fibrillation ([place])    no hx of reported at preop visit of 04/21/11   . CAD (coronary artery disease)   . Carotid artery occlusion    left carotid endarterectomy  . Chronic kidney disease    AAA repair with reimplant of renals   . CHRONIC OBSTRUCTIVE PULMONARY DISEASE    pt denied at visit of 04/21/11   . CORONARY ARTERY DISEASE   . Dehydration 11/15/2016  . Disorder of left sacroiliac joint 08/24/2016  . DIVERTICULOSIS OF COLON   . Encounter for antineoplastic chemotherapy 10/17/2016  . GERD   . H/O hiatal hernia   . Headache(784.0)    Hx: of years of years  . HYPERTENSION   . HYPOTHYROIDISM   . JOINT EFFUSION, KNEE   . KNEE, ARTHRITIS, DEGEN./OSTEO    right knee   . Myocardial infarction ([place])    1994   . NEPHROLITHIASIS, HX OF   . OSTEOPOROSIS   . Other dysphagia   . PERIPHERAL VASCULAR DISEASE    AAA - 1994 with reimplant of renals   . Peripheral vascular disease ([place])    subclavian stenosis PTA - 3/08   . Pure hypercholesterolemia   . Renal artery stenosis (H[facility])     SURGICAL HISTORY: Past Surgical History:  Procedure Laterality Date  . ABDOMINAL AORTIC ANEURYSM REPAIR     with reimplantation of renals   . CARDIAC CATHETERIZATION     2008  . CAROTID ENDARTERECTOMY Left Jan. 30, 2015   CEA  . CHOLECYSTECTOMY  2000   Gall  Bladder  . COLONOSCOPY W/ BIOPSIES AND POLYPECTOMY     Hx: of  . CORONARY ARTERY BYPASS GRAFT     1995  . CORONARY STENT PLACEMENT     Hx: of  . ENDARTERECTOMY Left 07/19/2013   Procedure: ENDARTERECTOMY CAROTID;  Surgeon: Mal Misty, MD;  Location: [facility];  Service: Vascular;  Laterality: Left;  . GALLBLADDER SURGERY  2004   . JOINT REPLACEMENT     partial knee replacement on left 2002   . KNEE SURGERY    . ORIF PELVIC FRACTURE Left 08/25/2016   Procedure: OPEN REDUCTION INTERNAL FIXATION (ORIF) PELVIC FRACTURE; plate on front, SI screw on the back;  Surgeon: Altamese , MD;  Location: [facility];  Service: Orthopedics;  Laterality: Left;  . OTHER SURGICAL HISTORY     left subclavian stenosis surgery PTA 08/2006   . OTHER SURGICAL HISTORY     carotid surgery on right 2004   . TOTAL KNEE ARTHROPLASTY  04/29/2011   Procedure: TOTAL KNEE ARTHROPLASTY;  Surgeon: Gearlean Alf;  Location: [facility];  Service: Orthopedics;  Laterality: Right;  .  urinary retention    . VASCULAR SURGERY     AAA  . VASECTOMY  1973  . VIDEO BRONCHOSCOPY WITH ENDOBRONCHIAL ULTRASOUND [name] 09/09/2016   Procedure: VIDEO BRONCHOSCOPY WITH ENDOBRONCHIAL ULTRASOUND;  Surgeon: Grace Isaac, MD;  Location: [facility];  Service: Thoracic;  Laterality: [name];    SOCIAL HISTORY: Social History   Social History  . Marital status: Married    Spouse name: [name]  . Number of children: [name]  . Years of education: [name]   Occupational History  . Not on file.   Social History Main Topics  . Smoking status: Former Smoker    Types: Cigarettes    Quit date: 06/20/1992  . Smokeless tobacco: Never Used  . Alcohol use No  . Drug use: No  . Sexual activity: Not on file   Other Topics Concern  . Not on file   Social History Narrative  . No narrative on file    FAMILY HISTORY: Family History  Problem Relation Age of Onset  . Hypertension Mother   . Heart disease Father   . Heart attack Father   . Diabetes Sister   .  Cancer Brother 31       Lung  . Diabetes Sister     Review of Systems  Constitutional: Negative.   HENT:  Negative.   Eyes: Negative.   Respiratory: Negative.   Cardiovascular: Negative.   Gastrointestinal: Negative.   Genitourinary: Negative.    Musculoskeletal: Negative.   Skin: Negative.   Neurological: Negative.   Hematological: Negative.   Psychiatric/Behavioral: Negative.       PHYSICAL EXAMINATION  ECOG PERFORMANCE STATUS: 1 - Symptomatic but completely ambulatory  Vitals:   03/29/17 1046  BP: 130/69  Pulse: 65  Resp: 18  Temp: 97.6 F (36.4 C)  SpO2: 97%    Physical Exam  Constitutional: He is oriented to person, place, and time and well-developed, well-nourished, and in no distress. No distress.  HENT:  Head: Normocephalic.  Mouth/Throat: Oropharynx is clear and moist. No oropharyngeal exudate.  Eyes: Conjunctivae are normal. Right eye exhibits no discharge. Left eye exhibits no discharge. No scleral icterus.  Neck: Normal range of motion. Neck supple.  Cardiovascular: Normal rate, regular rhythm, normal heart sounds and intact distal pulses.   Pulmonary/Chest: Effort normal and breath sounds normal. No respiratory distress. He has no wheezes. He has no rales.  Abdominal: Soft. Bowel sounds are normal. He exhibits no distension and no mass. There is no tenderness.  Musculoskeletal: Normal range of motion. He exhibits no edema.  Lymphadenopathy:    He has no cervical adenopathy.  Neurological: He is alert and oriented to person, place, and time. He exhibits normal muscle tone. Gait normal. Coordination normal.  Skin: Skin is warm and dry. No rash noted. He is not diaphoretic. No erythema. No pallor.  Psychiatric: Mood, memory, affect and judgment normal.  Vitals reviewed.   LABORATORY DATA:  CBC    Component Value Date/Time   WBC 4.8 03/29/2017 1030   WBC 15.8 (H) 10/04/2016 0440   RBC 4.32 03/29/2017 1030   RBC 3.78 (L) 10/04/2016 0440   HGB  12.6 (L) 03/29/2017 1030   HCT 37.1 (L) 03/29/2017 1030   PLT 138 (L) 03/29/2017 1030   MCV 85.9 03/29/2017 1030   MCH 29.2 03/29/2017 1030   MCH 30.7 10/04/2016 0440   MCHC 34.0 03/29/2017 1030   MCHC 35.9 10/04/2016 0440   RDW 15.4 (H) 03/29/2017 1030   LYMPHSABS 1.1 03/29/2017 1030  MONOABS 0.3 03/29/2017 1030   EOSABS 0.1 03/29/2017 1030   BASOSABS 0.0 03/29/2017 1030    CMP     Component Value Date/Time   NA 135 (L) 03/29/2017 1030   K 3.9 03/29/2017 1030   CL 100 (L) 10/06/2016 0611   CO2 25 03/29/2017 1030   GLUCOSE 104 03/29/2017 1030   BUN 12.5 03/29/2017 1030   CREATININE 0.7 03/29/2017 1030   CALCIUM 9.5 03/29/2017 1030   PROT 6.7 03/29/2017 1030   ALBUMIN 3.2 (L) 03/29/2017 1030   AST 19 03/29/2017 1030   ALT 14 03/29/2017 1030   ALKPHOS 120 03/29/2017 1030   BILITOT 0.41 03/29/2017 1030   GFRNONAA >60 10/06/2016 0611   GFRAA >60 10/06/2016 3664    RADIOGRAPHIC STUDIES:  No results found.  ASSESSMENT and THERAPY PLAN:   T1b N2 M0 disease (stage IIIA).adenocarcinoma of the right middle lobe of lung [facility]) This is a very pleasant 76 year old white male with a stage IIIa non-small cell lung cancer, adenocarcinoma with positive PDL 1 expression of 50%. The patient completed a course of concurrent chemoradiation with weekly carboplatin and paclitaxel status post 8 weeks and tolerated his treatment well except for mild dysphagia and odynophagia. He is currently undergoing consolidation immunotherapy with Imfinzi (Durvalumab) status post 5 cycles. He tolerated cycle #5 fairly well with no specific complaints. I recommended for him to proceed with cycle #6 today as scheduled. He will have a restaging CT scan of the chest prior to his next cycle of immunotherapy.  He will receive his flu shot in the infusion room today per his request.  The patient would come back for follow-up visit in 2 weeks for evaluation before starting cycle #7 and to review his restaging  CT scan results.  He was advised to call immediately if he has any concerning symptoms in the interval. The patient voices understanding of current disease status and treatment options and is in agreement with the current care plan. All questions were answered. The patient knows to call the clinic with any problems, questions or concerns. We can certainly see the patient much sooner if necessary.   Orders Placed This Encounter  Procedures  . CT CHEST W CONTRAST    Standing Status:   Future    Standing Expiration Date:   03/29/2018    Order Specific Question:   If indicated for the ordered procedure, I authorize the administration of contrast media per Radiology protocol    Answer:   Yes    Order Specific Question:   Preferred imaging location?    Answer:   [facility]    Order Specific Question:   Radiology Contrast Protocol - do NOT remove file path    Answer:   [email or web address]    Order Specific Question:   Reason for Exam additional comments    Answer:   lung cancer restaging.    All questions were answered. The patient knows to call the clinic with any problems, questions or concerns. We can certainly see the patient much sooner if necessary.  [name] Bussing, NP 03/29/2017

## 2017-03-30 DIAGNOSIS — R269 Unspecified abnormalities of gait and mobility: Secondary | ICD-10-CM | POA: Diagnosis not present

## 2017-03-30 DIAGNOSIS — M25651 Stiffness of right hip, not elsewhere classified: Secondary | ICD-10-CM | POA: Diagnosis not present

## 2017-03-30 DIAGNOSIS — M62559 Muscle wasting and atrophy, not elsewhere classified, unspecified thigh: Secondary | ICD-10-CM | POA: Diagnosis not present

## 2017-03-30 DIAGNOSIS — M25652 Stiffness of left hip, not elsewhere classified: Secondary | ICD-10-CM | POA: Diagnosis not present

## 2017-03-31 ENCOUNTER — Ambulatory Visit: Payer: Medicare Other

## 2017-04-03 ENCOUNTER — Ambulatory Visit (INDEPENDENT_AMBULATORY_CARE_PROVIDER_SITE_OTHER): Payer: Medicare Other | Admitting: *Deleted

## 2017-04-03 DIAGNOSIS — Z7901 Long term (current) use of anticoagulants: Secondary | ICD-10-CM

## 2017-04-03 DIAGNOSIS — M25651 Stiffness of right hip, not elsewhere classified: Secondary | ICD-10-CM | POA: Diagnosis not present

## 2017-04-03 DIAGNOSIS — R269 Unspecified abnormalities of gait and mobility: Secondary | ICD-10-CM | POA: Diagnosis not present

## 2017-04-03 DIAGNOSIS — M25652 Stiffness of left hip, not elsewhere classified: Secondary | ICD-10-CM | POA: Diagnosis not present

## 2017-04-03 DIAGNOSIS — M62559 Muscle wasting and atrophy, not elsewhere classified, unspecified thigh: Secondary | ICD-10-CM | POA: Diagnosis not present

## 2017-04-03 LAB — POCT INR: INR: 2.7

## 2017-04-03 NOTE — Patient Instructions (Signed)
Take coumadin 5mg  one tablet daily. Recheck INR in 4 weeks

## 2017-04-04 DIAGNOSIS — M62559 Muscle wasting and atrophy, not elsewhere classified, unspecified thigh: Secondary | ICD-10-CM | POA: Diagnosis not present

## 2017-04-04 DIAGNOSIS — M25652 Stiffness of left hip, not elsewhere classified: Secondary | ICD-10-CM | POA: Diagnosis not present

## 2017-04-04 DIAGNOSIS — M25651 Stiffness of right hip, not elsewhere classified: Secondary | ICD-10-CM | POA: Diagnosis not present

## 2017-04-04 DIAGNOSIS — R269 Unspecified abnormalities of gait and mobility: Secondary | ICD-10-CM | POA: Diagnosis not present

## 2017-04-06 DIAGNOSIS — R269 Unspecified abnormalities of gait and mobility: Secondary | ICD-10-CM | POA: Diagnosis not present

## 2017-04-06 DIAGNOSIS — M25651 Stiffness of right hip, not elsewhere classified: Secondary | ICD-10-CM | POA: Diagnosis not present

## 2017-04-06 DIAGNOSIS — M62559 Muscle wasting and atrophy, not elsewhere classified, unspecified thigh: Secondary | ICD-10-CM | POA: Diagnosis not present

## 2017-04-06 DIAGNOSIS — M25652 Stiffness of left hip, not elsewhere classified: Secondary | ICD-10-CM | POA: Diagnosis not present

## 2017-04-07 ENCOUNTER — Telehealth: Payer: Self-pay | Admitting: Family Medicine

## 2017-04-07 ENCOUNTER — Other Ambulatory Visit: Payer: Self-pay

## 2017-04-07 MED ORDER — LEVOTHYROXINE SODIUM 112 MCG PO TABS: 112.0000 ug | ORAL_TABLET | Freq: Every day | ORAL | 3 refills | Status: DC

## 2017-04-07 MED ORDER — POTASSIUM CHLORIDE CRYS ER 20 MEQ PO TBCR: 20.0000 meq | EXTENDED_RELEASE_TABLET | Freq: Every day | ORAL | 0 refills | Status: DC

## 2017-04-07 NOTE — Telephone Encounter (Signed)
Ok 12 mo worth

## 2017-04-07 NOTE — Telephone Encounter (Signed)
Fax from pharmacy asking for refills on Levothyroxine 112 mcg. Please advise?

## 2017-04-07 NOTE — Addendum Note (Signed)
Addended by: Ofilia Neas R on: 04/07/2017 04:53 PM   Modules accepted: Orders

## 2017-04-10 ENCOUNTER — Ambulatory Visit (HOSPITAL_COMMUNITY)
Admission: RE | Admit: 2017-04-10 | Discharge: 2017-04-10 | Disposition: A | Discharge status: Home / Self Care | Payer: Medicare Other | Source: Ambulatory Visit | Attending: Oncology | Admitting: Oncology

## 2017-04-10 DIAGNOSIS — M25652 Stiffness of left hip, not elsewhere classified: Secondary | ICD-10-CM | POA: Diagnosis not present

## 2017-04-10 DIAGNOSIS — M25651 Stiffness of right hip, not elsewhere classified: Secondary | ICD-10-CM | POA: Diagnosis not present

## 2017-04-10 DIAGNOSIS — I700 Atherosclerosis of aorta: Secondary | ICD-10-CM | POA: Insufficient documentation

## 2017-04-10 DIAGNOSIS — C3490 Malignant neoplasm of unspecified part of unspecified bronchus or lung: Secondary | ICD-10-CM | POA: Diagnosis not present

## 2017-04-10 DIAGNOSIS — C342 Malignant neoplasm of middle lobe, bronchus or lung: Secondary | ICD-10-CM | POA: Diagnosis not present

## 2017-04-10 DIAGNOSIS — R269 Unspecified abnormalities of gait and mobility: Secondary | ICD-10-CM | POA: Diagnosis not present

## 2017-04-10 DIAGNOSIS — M62559 Muscle wasting and atrophy, not elsewhere classified, unspecified thigh: Secondary | ICD-10-CM | POA: Diagnosis not present

## 2017-04-10 MED ORDER — IOPAMIDOL (ISOVUE-300) INJECTION 61%
100.0000 mL | Freq: Once | INTRAVENOUS | Status: AC | PRN
  Administered 2017-04-10: 75 mL via INTRAVENOUS

## 2017-04-12 ENCOUNTER — Encounter: Payer: Self-pay | Admitting: *Deleted

## 2017-04-12 ENCOUNTER — Ambulatory Visit (HOSPITAL_BASED_OUTPATIENT_CLINIC_OR_DEPARTMENT_OTHER): Payer: Medicare Other | Admitting: Internal Medicine

## 2017-04-12 ENCOUNTER — Ambulatory Visit (HOSPITAL_BASED_OUTPATIENT_CLINIC_OR_DEPARTMENT_OTHER): Payer: Medicare Other

## 2017-04-12 ENCOUNTER — Encounter: Payer: Self-pay | Admitting: Internal Medicine

## 2017-04-12 ENCOUNTER — Other Ambulatory Visit (HOSPITAL_BASED_OUTPATIENT_CLINIC_OR_DEPARTMENT_OTHER): Payer: Medicare Other

## 2017-04-12 VITALS — BP 128/65 | HR 69 | Temp 96.8°F | Resp 20 | Ht 68.0 in | Wt 132.4 lb

## 2017-04-12 DIAGNOSIS — Z5112 Encounter for antineoplastic immunotherapy: Secondary | ICD-10-CM

## 2017-04-12 DIAGNOSIS — E039 Hypothyroidism, unspecified: Secondary | ICD-10-CM

## 2017-04-12 DIAGNOSIS — C3411 Malignant neoplasm of upper lobe, right bronchus or lung: Secondary | ICD-10-CM | POA: Diagnosis not present

## 2017-04-12 DIAGNOSIS — C342 Malignant neoplasm of middle lobe, bronchus or lung: Secondary | ICD-10-CM

## 2017-04-12 LAB — COMPREHENSIVE METABOLIC PANEL
ALT: 13 U/L (ref 0–55)
AST: 19 U/L (ref 5–34)
Albumin: 3.3 g/dL — ABNORMAL LOW (ref 3.5–5.0)
Alkaline Phosphatase: 112 U/L (ref 40–150)
Anion Gap: 9 mEq/L (ref 3–11)
BUN: 13.7 mg/dL (ref 7.0–26.0)
CALCIUM: 9.7 mg/dL (ref 8.4–10.4)
CHLORIDE: 103 meq/L (ref 98–109)
CO2: 25 meq/L (ref 22–29)
CREATININE: 0.7 mg/dL (ref 0.7–1.3)
EGFR: >60 mL/min/{1.73_m2} (ref >60)
GLUCOSE: 117 mg/dL (ref 70–140)
Potassium: 4.1 mEq/L (ref 3.5–5.1)
SODIUM: 137 meq/L (ref 136–145)
Total Bilirubin: 0.43 mg/dL (ref 0.20–1.20)
Total Protein: 7 g/dL (ref 6.4–8.3)

## 2017-04-12 LAB — CBC WITH DIFFERENTIAL/PLATELET
BASO%: 0.8 % (ref 0.0–2.0)
Basophils Absolute: 0 10*3/uL (ref 0.0–0.1)
EOS%: 2.1 % (ref 0.0–7.0)
Eosinophils Absolute: 0.1 10*3/uL (ref 0.0–0.5)
HEMATOCRIT: 35.3 % — AB (ref 38.4–49.9)
HGB: 12.1 g/dL — ABNORMAL LOW (ref 13.0–17.1)
LYMPH#: 0.7 10*3/uL — AB (ref 0.9–3.3)
LYMPH%: 15.9 % (ref 14.0–49.0)
MCH: 29.1 pg (ref 27.2–33.4)
MCHC: 34.4 g/dL (ref 32.0–36.0)
MCV: 84.6 fL (ref 79.3–98.0)
MONO#: 0.3 10*3/uL (ref 0.1–0.9)
MONO%: 6.1 % (ref 0.0–14.0)
NEUT#: 3.3 10*3/uL (ref 1.5–6.5)
NEUT%: 75.1 % — AB (ref 39.0–75.0)
Platelets: 177 10*3/uL (ref 140–400)
RBC: 4.17 10*6/uL — ABNORMAL LOW (ref 4.20–5.82)
RDW: 16.3 % — AB (ref 11.0–14.6)
WBC: 4.4 10*3/uL (ref 4.0–10.3)

## 2017-04-12 MED ORDER — SODIUM CHLORIDE 0.9% FLUSH
10.0000 mL | INTRAVENOUS | Status: DC | PRN
  Filled 2017-04-12: qty 10

## 2017-04-12 MED ORDER — SODIUM CHLORIDE 0.9 % IV SOLN
10.0000 mg/kg | Freq: Once | INTRAVENOUS | Status: AC
  Administered 2017-04-12: 620 mg via INTRAVENOUS
  Filled 2017-04-12: qty 2.4

## 2017-04-12 MED ORDER — SODIUM CHLORIDE 0.9 % IV SOLN
Freq: Once | INTRAVENOUS | Status: AC
  Administered 2017-04-12: 11:00:00 via INTRAVENOUS

## 2017-04-12 NOTE — Patient Instructions (Signed)
[facility] Discharge Instructions for Patients Receiving Chemotherapy  Today you received the following chemotherapy agents: Imfinzi.  To help prevent nausea and vomiting after your treatment, we encourage you to take your nausea medication as directed.   If you develop nausea and vomiting that is not controlled by your nausea medication, call the clinic.   BELOW ARE SYMPTOMS THAT SHOULD BE REPORTED IMMEDIATELY:  *FEVER GREATER THAN 100.5 F  *CHILLS WITH OR WITHOUT FEVER  NAUSEA AND VOMITING THAT IS NOT CONTROLLED WITH YOUR NAUSEA MEDICATION  *UNUSUAL SHORTNESS OF BREATH  *UNUSUAL BRUISING OR BLEEDING  TENDERNESS IN MOUTH AND THROAT WITH OR WITHOUT PRESENCE OF ULCERS  *URINARY PROBLEMS  *BOWEL PROBLEMS  UNUSUAL RASH Items with * indicate a potential emergency and should be followed up as soon as possible.  Feel free to call the clinic should you have any questions or concerns. The clinic phone number is [phone].  Please show the CHEMO ALERT CARD at check-in to the Emergency Department and triage nurse.   

## 2017-04-12 NOTE — Progress Notes (Signed)
Oncology Nurse Navigator Documentation  Oncology Nurse Navigator Flowsheets 04/12/2017  Navigator Location [facility]  Navigator Encounter Type Clinic/MDC/I spoke with patient and family today. He is doing well without complaints. He and family understand treatment plan. No barriers identified at this time  Patient Visit Type MedOnc  Treatment Phase Treatment  Barriers/Navigation Needs No barriers at this time  Acuity Level 1  Acuity Level 1 Minimal follow up required  Time Spent with Patient 15

## 2017-04-12 NOTE — Progress Notes (Signed)
[facility] [phone]   [phone]) [phone]  OFFICE PROGRESS NOTE  [name], MD [address] [place] [address]  DIAGNOSIS: Stage IIIA (T1b, N2, M0) non-small cell lung cancer, adenocarcinoma with positive PDL 1 expression 50% diagnosed in March 2018 and presented with right upper lobe lung nodule in addition to mediastinal lymphadenopathy.  PRIOR THERAPY: Course of concurrent chemoradiation with weekly carboplatin for AUC of 2 and paclitaxel 45 MG/M2. First dose of chemotherapy 10/17/2016. Status post 8 cycles. Last dose was given 12/05/2016 with partial response.[name]  CURRENT THERAPY: Consolidation treatment with immunotherapy with Imfinzi (Durvalumab) 10 MG/KG every 2 weeks. First dose 01/18/2017. Status post 6 cycles.  INTERVAL HISTORY: [name] [name] 76 y.o. male returns to the clinic today for follow-up visit accompanied by his wife. The patient has been tolerating his treatment with Imfinzi (Durvalumab) fairly well with no significant adverse effects. He denied having any skin rash or diarrhea. He has no nausea, vomiting, diarrhea or constipation. He has no weight loss or night sweats. The patient continues to have occasional confusion. He denied having any chest pain, shortness of breath, cough or hemoptysis. He had repeat CT scan of the chest performed recently and he is here for evaluation and discussion of his scan results.  MEDICAL HISTORY: Past Medical History:  Diagnosis Date  . Adenocarcinoma of right lung, stage 3 ([facility]) 09/20/2016  . Atrial fibrillation ([facility])    no hx of reported at preop visit of 04/21/11   . CAD (coronary artery disease)   . Carotid artery occlusion    left carotid endarterectomy  . Chronic kidney disease    AAA repair with reimplant of renals   . CHRONIC OBSTRUCTIVE PULMONARY DISEASE    pt denied at visit of 04/21/11   . CORONARY ARTERY DISEASE   . Dehydration 11/15/2016  . Disorder of left  sacroiliac joint 08/24/2016  . DIVERTICULOSIS OF COLON   . Encounter for antineoplastic chemotherapy 10/17/2016  . GERD   . H/O hiatal hernia   . Headache(784.0)    Hx: of years of years  . HYPERTENSION   . HYPOTHYROIDISM   . JOINT EFFUSION, KNEE   . KNEE, ARTHRITIS, DEGEN./OSTEO    right knee   . Myocardial infarction ([facility])    1994   . NEPHROLITHIASIS, HX OF   . OSTEOPOROSIS   . Other dysphagia   . PERIPHERAL VASCULAR DISEASE    AAA - 1994 with reimplant of renals   . Peripheral vascular disease ([facility])    subclavian stenosis PTA - 3/08   . Pure hypercholesterolemia   . Renal artery stenosis ([facility])     ALLERGIES:  is allergic to neurontin [gabapentin]; imitrex [sumatriptan]; and lipitor [atorvastatin].  MEDICATIONS:  Current Outpatient Prescriptions  Medication Sig Dispense Refill  . acetaminophen (TYLENOL) 325 MG tablet Take 325-650 mg by mouth every 6 (six) hours as needed for headache (for pain).     [name] ALPRAZolam (XANAX) 0.25 MG tablet Take 1 tablet (0.25 mg total) by mouth 3 (three) times daily as needed for anxiety. 30 tablet 0  . bumetanide (BUMEX) 1 MG tablet Take 1 tablet (1 mg total) by mouth daily as needed (swelling). 30 tablet 1  . donepezil (ARICEPT) 5 MG tablet Take 1 tablet (5 mg total) by mouth at bedtime. 90 tablet 1  . lansoprazole (PREVACID) 15 MG capsule Take 1 capsule (15 mg total) by mouth daily. 30 capsule 5  . levothyroxine (SYNTHROID,  LEVOTHROID) 112 MCG tablet Take 1 tablet (112 mcg total) by mouth daily. 90 tablet 3  . linaclotide (LINZESS) 145 MCG CAPS capsule Take 1 capsule (145 mcg total) by mouth daily before breakfast. 30 capsule 11  . magic mouthwash w/lidocaine SOLN Composition: 80 cc each of Extra St. Maalox Plus, Diphenhydramine, and Nystatin. Plus 240 cc of 2% viscous lidocaine to make 480 cc. Swish and swallow 2 tsp four times per day. (Patient not taking: Reported on 03/20/2017) 480 mL 2  . metoprolol tartrate (LOPRESSOR) 25 MG tablet TAKE  ONE-HALF TABLET BY MOUTH TWO TIMES DAILY. 30 tablet 5  . niacin (SLO-NIACIN) 500 MG tablet Take 3 tablets (1,500 mg total) by mouth at bedtime. 30 tablet 0  . nitroGLYCERIN (NITROSTAT) 0.4 MG SL tablet Place 1 tablet (0.4 mg total) under the tongue every 5 (five) minutes as needed for chest pain. (Patient not taking: Reported on 03/20/2017) 25 tablet 10  . ondansetron (ZOFRAN) 8 MG tablet Take 1 tablet (8 mg total) by mouth every 8 (eight) hours as needed for nausea or vomiting. 20 tablet 0  . potassium chloride SA (K-DUR,KLOR-CON) 20 MEQ tablet Take 1 tablet (20 mEq total) by mouth daily. 90 tablet 0  . prochlorperazine (COMPAZINE) 10 MG tablet Take 1 tablet (10 mg total) by mouth every 6 (six) hours as needed for nausea or vomiting. 30 tablet 0  . rosuvastatin (CRESTOR) 20 MG tablet Take 1 tablet (20 mg total) by mouth daily. 30 tablet 5  . sucralfate (CARAFATE) 1 GM/10ML suspension Take 10 mLs (1 g total) by mouth 4 (four) times daily -  with meals and at bedtime. 420 mL 1  . tamsulosin (FLOMAX) 0.4 MG CAPS capsule Take 1 capsule (0.4 mg total) by mouth daily after breakfast. 30 capsule 1  . warfarin (COUMADIN) 5 MG tablet Take one tablet daily. take as directed by physician. 90 tablet 1   No current facility-administered medications for this visit.     SURGICAL HISTORY:  Past Surgical History:  Procedure Laterality Date  . ABDOMINAL AORTIC ANEURYSM REPAIR     with reimplantation of renals   . CARDIAC CATHETERIZATION     2008  . CAROTID ENDARTERECTOMY Left Jan. 30, 2015   CEA  . CHOLECYSTECTOMY  2000   Gall Bladder  . COLONOSCOPY W/ BIOPSIES AND POLYPECTOMY     Hx: of  . CORONARY ARTERY BYPASS GRAFT     1995  . CORONARY STENT PLACEMENT     Hx: of  . ENDARTERECTOMY Left 07/19/2013   Procedure: ENDARTERECTOMY CAROTID;  Surgeon: Mal Misty, MD;  Location: [facility];  Service: Vascular;  Laterality: Left;  . GALLBLADDER SURGERY  2004   . JOINT REPLACEMENT     partial knee replacement  on left 2002   . KNEE SURGERY    . ORIF PELVIC FRACTURE Left 08/25/2016   Procedure: OPEN REDUCTION INTERNAL FIXATION (ORIF) PELVIC FRACTURE; plate on front, SI screw on the back;  Surgeon: Altamese Kerr, MD;  Location: [facility];  Service: Orthopedics;  Laterality: Left;  . OTHER SURGICAL HISTORY     left subclavian stenosis surgery PTA 08/2006   . OTHER SURGICAL HISTORY     carotid surgery on right 2004   . TOTAL KNEE ARTHROPLASTY  04/29/2011   Procedure: TOTAL KNEE ARTHROPLASTY;  Surgeon: Gearlean Alf;  Location: [facility];  Service: Orthopedics;  Laterality: Right;  . urinary retention    . VASCULAR SURGERY     AAA  . VASECTOMY  [place] WITH ENDOBRONCHIAL ULTRASOUND [name] 09/09/2016   Procedure: VIDEO BRONCHOSCOPY WITH ENDOBRONCHIAL ULTRASOUND;  Surgeon: Grace Isaac, MD;  Location: [facility];  Service: Thoracic;  Laterality: [name];    REVIEW OF SYSTEMS:  Constitutional: positive for fatigue Eyes: negative Ears, nose, mouth, throat, and face: negative Respiratory: positive for dyspnea on exertion Cardiovascular: negative Gastrointestinal: negative Genitourinary:negative Integument/breast: negative Hematologic/lymphatic: negative Musculoskeletal:negative Neurological: negative Behavioral/Psych: negative Endocrine: negative Allergic/Immunologic: negative   PHYSICAL EXAMINATION: General appearance: alert, cooperative, fatigued and no distress Head: Normocephalic, without obvious abnormality, atraumatic Neck: no adenopathy, no JVD, supple, symmetrical, trachea midline and thyroid not enlarged, symmetric, no tenderness/mass/nodules Lymph nodes: Cervical, supraclavicular, and axillary nodes normal. Resp: clear to auscultation bilaterally Back: symmetric, no curvature. ROM normal. No CVA tenderness. Cardio: regular rate and rhythm, S1, S2 normal, no murmur, click, rub or gallop GI: soft, non-tender; bowel sounds normal; no masses,  no organomegaly Extremities: extremities  normal, atraumatic, no cyanosis or edema Neurologic: Alert and oriented X 3, normal strength and tone. Normal symmetric reflexes. Normal coordination and gait  ECOG PERFORMANCE STATUS: 1 - Symptomatic but completely ambulatory  Blood pressure 128/65, pulse 69, temperature (!) 96.8 F (36 C), temperature source Oral, resp. rate 20, height 5\' 8"  (1.727 m), weight 132 lb 6.4 oz (60.1 kg), SpO2 100 %.  LABORATORY DATA: Lab Results  Component Value Date   WBC 4.4 04/12/2017   HGB 12.1 (L) 04/12/2017   HCT 35.3 (L) 04/12/2017   MCV 84.6 04/12/2017   PLT 177 04/12/2017      Chemistry      Component Value Date/Time   NA 135 (L) 03/29/2017 1030   K 3.9 03/29/2017 1030   CL 100 (L) 10/06/2016 0611   CO2 25 03/29/2017 1030   BUN 12.5 03/29/2017 1030   CREATININE 0.7 03/29/2017 1030      Component Value Date/Time   CALCIUM 9.5 03/29/2017 1030   ALKPHOS 120 03/29/2017 1030   AST 19 03/29/2017 1030   ALT 14 03/29/2017 1030   BILITOT 0.41 03/29/2017 1030       RADIOGRAPHIC STUDIES: Ct Chest W Contrast  Result Date: 04/10/2017 CLINICAL DATA:  Staging right middle lobe lung cancer. Post chemotherapy. No current complaints. EXAM: CT CHEST WITH CONTRAST TECHNIQUE: Multidetector CT imaging of the chest was performed during intravenous contrast administration. CONTRAST:  52mL ISOVUE-300 IOPAMIDOL (ISOVUE-300) INJECTION 61% COMPARISON:  PET-CT 09/29/2016.  Chest CT 08/22/2016 and 12/30/2016. FINDINGS: Cardiovascular: Severe atherosclerosis of aorta, great vessels and coronary arteries status post median sternotomy and CABG. There is a left subclavian stent. No acute vascular findings are evident. Stable mild cardiomegaly. No significant pericardial effusion. Mediastinum/Nodes: There are no enlarged mediastinal, hilar or axillary lymph nodes. The thyroid gland, trachea and esophagus demonstrate no significant findings. Lungs/Pleura: Trace pleural fluid or thickening on the right. There are  progressive paramediastinal radiation changes in the right perihilar region, obscuring the previously demonstrated spiculated right middle lobe nodule. There is associated architectural distortion and volume loss in the right hemithorax. 4 mm subpleural right upper lobe nodule on image 33 is stable. A sub solid subpleural nodule posteriorly in the left upper lobe is stable, measuring 10 mm on image 39. No new or enlarging pulmonary nodules identified. Underlying mild emphysema noted. Upper abdomen: The visualized upper abdomen appears stable without suspicious findings. Musculoskeletal/Chest wall: There is no chest wall mass or suspicious osseous finding. There are stable compression deformities at T6 and T8. IMPRESSION: 1. Progressive radiated changes in the  right paramediastinal lung. These obscure the previously demonstrated right middle lobe nodule. 2. Stable mild upper lobe nodularity bilaterally. No evidence of metastatic disease. 3. Extensive atherosclerosis, including Aortic Atherosclerosis (ICD10-I70.0). 4. Stable compression deformities at T6 and T8. Electronically Signed   By: Richardean Sale M.D.   On: 04/10/2017 15:12    ASSESSMENT AND PLAN:  This is a very pleasant 76 years old white male with a stage IIIa non-small cell lung cancer, adenocarcinoma with positive PDL 1 expression of 50%. The patient completed a course of concurrent chemoradiation with weekly carboplatin and paclitaxel status post 8 weeks and tolerated his treatment well except for mild dysphagia and odynophagia. He is currently undergoing consolidation immunotherapy with Imfinzi (Durvalumab) status post 6 cycles. The patient continues to tolerate his treatment fairly well with no significant adverse effects. He had repeat CT scan of the chest that showed no evidence for disease progression. I personally and independently reviewed the scans and discussed the results with the patient and his wife. I recommended for the patient to  proceed with cycle #7 today as a scheduled. I would see him back for follow-up visit in 2 weeks for evaluation before the next dose of his treatment. He was advised to call immediately if he has any concerning symptoms in the interval. The patient voices understanding of current disease status and treatment options and is in agreement with the current care plan. All questions were answered. The patient knows to call the clinic with any problems, questions or concerns. We can certainly see the patient much sooner if necessary.  Disclaimer: This note was dictated with voice recognition software. Similar sounding words can inadvertently be transcribed and may not be corrected upon review.

## 2017-04-13 ENCOUNTER — Telehealth: Payer: Self-pay | Admitting: Internal Medicine

## 2017-04-13 DIAGNOSIS — M25651 Stiffness of right hip, not elsewhere classified: Secondary | ICD-10-CM | POA: Diagnosis not present

## 2017-04-13 DIAGNOSIS — R269 Unspecified abnormalities of gait and mobility: Secondary | ICD-10-CM | POA: Diagnosis not present

## 2017-04-13 DIAGNOSIS — M62559 Muscle wasting and atrophy, not elsewhere classified, unspecified thigh: Secondary | ICD-10-CM | POA: Diagnosis not present

## 2017-04-13 DIAGNOSIS — M25652 Stiffness of left hip, not elsewhere classified: Secondary | ICD-10-CM | POA: Diagnosis not present

## 2017-04-13 NOTE — Telephone Encounter (Signed)
Scheduled appt per 10/24 los - Patient to get an updated schedule next visit.

## 2017-04-14 DIAGNOSIS — M25651 Stiffness of right hip, not elsewhere classified: Secondary | ICD-10-CM | POA: Diagnosis not present

## 2017-04-14 DIAGNOSIS — M25652 Stiffness of left hip, not elsewhere classified: Secondary | ICD-10-CM | POA: Diagnosis not present

## 2017-04-14 DIAGNOSIS — M62559 Muscle wasting and atrophy, not elsewhere classified, unspecified thigh: Secondary | ICD-10-CM | POA: Diagnosis not present

## 2017-04-14 DIAGNOSIS — R269 Unspecified abnormalities of gait and mobility: Secondary | ICD-10-CM | POA: Diagnosis not present

## 2017-04-17 DIAGNOSIS — R269 Unspecified abnormalities of gait and mobility: Secondary | ICD-10-CM | POA: Diagnosis not present

## 2017-04-17 DIAGNOSIS — M25651 Stiffness of right hip, not elsewhere classified: Secondary | ICD-10-CM | POA: Diagnosis not present

## 2017-04-17 DIAGNOSIS — M62559 Muscle wasting and atrophy, not elsewhere classified, unspecified thigh: Secondary | ICD-10-CM | POA: Diagnosis not present

## 2017-04-17 DIAGNOSIS — M25652 Stiffness of left hip, not elsewhere classified: Secondary | ICD-10-CM | POA: Diagnosis not present

## 2017-04-18 ENCOUNTER — Other Ambulatory Visit: Payer: Self-pay | Admitting: Family Medicine

## 2017-04-18 ENCOUNTER — Ambulatory Visit (INDEPENDENT_AMBULATORY_CARE_PROVIDER_SITE_OTHER): Payer: Medicare Other | Admitting: Vascular Surgery

## 2017-04-18 ENCOUNTER — Encounter: Payer: Self-pay | Admitting: Vascular Surgery

## 2017-04-18 VITALS — BP 124/70 | HR 70 | Temp 96.8°F | Resp 16 | Ht 68.0 in | Wt 130.8 lb

## 2017-04-18 DIAGNOSIS — I6523 Occlusion and stenosis of bilateral carotid arteries: Secondary | ICD-10-CM | POA: Diagnosis not present

## 2017-04-18 DIAGNOSIS — I6529 Occlusion and stenosis of unspecified carotid artery: Secondary | ICD-10-CM | POA: Diagnosis not present

## 2017-04-18 NOTE — Progress Notes (Signed)
[facility]  Patient name: [name] [name] MRN: [id number] DOB: 26-Feb-1941 Sex: male  REASON FOR VISIT: Follow-up right carotid stenosis  HPI: [name] [name] is a 76 y.o. male here today for follow-up.  He had had a right brain event most likely related to high-grade right carotid disease.  Undergone right carotid endarterectomy with Dr. Kellie Simmering in 2005 had undergone left carotid endarterectomy with Dr. Kellie Simmering in 2015.  During his evaluation of his neurologic event he was at a new diagnosis of stage III lung cancer and has been initiated on therapy since August.  With my last visit with him had recommended observation until he had stabilized from his chemotherapy and immunotherapy treatment.  I discussed this with his oncologist as well, Dr. Julien Nordmann.  He appears to be doing stable from this standpoint.  Recent CT scan showed no progression of his lung cancer.  He is debilitated from his treatment.  He is here today with his wife and he and she both report no new neurologic deficits.  Past Medical History:  Diagnosis Date  . Adenocarcinoma of right lung, stage 3 ([facility]) 09/20/2016  . Atrial fibrillation ([place] [facility])    no hx of reported at preop visit of 04/21/11   . CAD (coronary artery disease)   . Carotid artery occlusion    left carotid endarterectomy  . Chronic kidney disease    AAA repair with reimplant of renals   . CHRONIC OBSTRUCTIVE PULMONARY DISEASE    pt denied at visit of 04/21/11   . CORONARY ARTERY DISEASE   . Dehydration 11/15/2016  . Disorder of left sacroiliac joint 08/24/2016  . DIVERTICULOSIS OF COLON   . Encounter for antineoplastic chemotherapy 10/17/2016  . GERD   . H/O hiatal hernia   . Headache(784.0)    Hx: of years of years  . HYPERTENSION   . HYPOTHYROIDISM   . JOINT EFFUSION, KNEE   . KNEE, ARTHRITIS, DEGEN./OSTEO    right knee   . Myocardial infarction ([facility])    1994   . NEPHROLITHIASIS, HX OF   .  OSTEOPOROSIS   . Other dysphagia   . PERIPHERAL VASCULAR DISEASE    AAA - 1994 with reimplant of renals   . Peripheral vascular disease ([facility])    subclavian stenosis PTA - 3/08   . Pure hypercholesterolemia   . Renal artery stenosis ([facility])     Family History  Problem Relation Age of Onset  . Hypertension Mother   . Heart disease Father   . Heart attack Father   . Diabetes Sister   . Cancer Brother 26       Lung  . Diabetes Sister     SOCIAL HISTORY: Social History  Substance Use Topics  . Smoking status: Former Smoker    Types: Cigarettes    Quit date: 06/20/1992  . Smokeless tobacco: Never Used  . Alcohol use No    Allergies  Allergen Reactions  . Neurontin [Gabapentin] Other (See Comments)    Caused confusion  . Imitrex [Sumatriptan] Nausea And Vomiting and Other (See Comments)    "made me like I was having a stoke"  . Lipitor [Atorvastatin] Nausea And Vomiting and Other (See Comments)    INTOLERANCE > MYALGIAS "couldn't get out of the bed ( pt had to crawl out of bed ) I hurt so bad"    Current Outpatient Prescriptions  Medication Sig Dispense Refill  . acetaminophen (TYLENOL) 325 MG tablet Take 325-650 mg by  mouth every 6 (six) hours as needed for headache (for pain).     [name] ALPRAZolam (XANAX) 0.25 MG tablet Take 1 tablet (0.25 mg total) by mouth 3 (three) times daily as needed for anxiety. 30 tablet 0  . donepezil (ARICEPT) 5 MG tablet Take 1 tablet (5 mg total) by mouth at bedtime. 90 tablet 1  . lansoprazole (PREVACID) 15 MG capsule Take 1 capsule (15 mg total) by mouth daily. 30 capsule 5  . levothyroxine (SYNTHROID, LEVOTHROID) 112 MCG tablet Take 1 tablet (112 mcg total) by mouth daily. 90 tablet 3  . metoprolol tartrate (LOPRESSOR) 25 MG tablet TAKE ONE-HALF TABLET BY MOUTH TWO TIMES DAILY. 30 tablet 5  . niacin (SLO-NIACIN) 500 MG tablet Take 3 tablets (1,500 mg total) by mouth at bedtime. 30 tablet 0  . ondansetron (ZOFRAN) 8 MG tablet Take 1 tablet (8 mg  total) by mouth every 8 (eight) hours as needed for nausea or vomiting. 20 tablet 0  . potassium chloride SA (K-DUR,KLOR-CON) 20 MEQ tablet Take 1 tablet (20 mEq total) by mouth daily. 90 tablet 0  . rosuvastatin (CRESTOR) 20 MG tablet Take 1 tablet (20 mg total) by mouth daily. 30 tablet 5  . tamsulosin (FLOMAX) 0.4 MG CAPS capsule Take 1 capsule (0.4 mg total) by mouth daily after breakfast. 30 capsule 1  . warfarin (COUMADIN) 5 MG tablet Take one tablet daily. take as directed by physician. 90 tablet 1  . bumetanide (BUMEX) 1 MG tablet Take 1 tablet (1 mg total) by mouth daily as needed (swelling). (Patient not taking: Reported on 04/18/2017) 30 tablet 1  . linaclotide (LINZESS) 145 MCG CAPS capsule Take 1 capsule (145 mcg total) by mouth daily before breakfast. (Patient not taking: Reported on 04/18/2017) 30 capsule 11  . magic mouthwash w/lidocaine SOLN Composition: 80 cc each of Extra St. Maalox Plus, Diphenhydramine, and Nystatin. Plus 240 cc of 2% viscous lidocaine to make 480 cc. Swish and swallow 2 tsp four times per day. (Patient not taking: Reported on 04/18/2017) 480 mL 2  . nitroGLYCERIN (NITROSTAT) 0.4 MG SL tablet Place 1 tablet (0.4 mg total) under the tongue every 5 (five) minutes as needed for chest pain. (Patient not taking: Reported on 04/18/2017) 25 tablet 10  . prochlorperazine (COMPAZINE) 10 MG tablet Take 1 tablet (10 mg total) by mouth every 6 (six) hours as needed for nausea or vomiting. (Patient not taking: Reported on 04/18/2017) 30 tablet 0  . sucralfate (CARAFATE) 1 GM/10ML suspension Take 10 mLs (1 g total) by mouth 4 (four) times daily -  with meals and at bedtime. (Patient not taking: Reported on 04/18/2017) 420 mL 1   No current facility-administered medications for this visit.     REVIEW OF SYSTEMS:  [X]  denotes positive finding, [ ]  denotes negative finding Cardiac  Comments:  Chest pain or chest pressure:    Shortness of breath upon exertion:    Short of  breath when lying flat:    Irregular heart rhythm:        Vascular    Pain in calf, thigh, or hip brought on by ambulation: x   Pain in feet at night that wakes you up from your sleep:     Blood clot in your veins:    Leg swelling:  x         PHYSICAL EXAM: Vitals:   04/18/17 1135 04/18/17 1139  BP: 131/64 124/70  Pulse: 70   Resp: 16   Temp: (!) 96.8 F (  36 C)   TempSrc: Oral   SpO2: 99%   Weight: 130 lb 12.8 oz (59.3 kg)   Height: 5\' 8"  (1.727 m)     GENERAL: The patient is a well-nourished male, in no acute distress. The vital signs are documented above. CARDIOVASCULAR: Carotid arteries with well-healed incisions and no bruits bilaterally PULMONARY: There is good air exchange  MUSCULOSKELETAL: There are no major deformities or cyanosis. NEUROLOGIC: No focal weakness or paresthesias are detected. SKIN: There are no ulcers or rashes noted. PSYCHIATRIC: The patient has a normal affect.  DATA:  No new data  MEDICAL ISSUES: I discussed options with the patient and his wife.  He does appear to be stable but rather debilitated from his lung cancer treatment.  Explained the difficult decision making regarding appropriate treatment.  I did explain his potential risk for recurrent neurologic symptoms related to his right carotid restenosis.  I do feel that he would be at some increased risk due to his debility and ongoing treatment for lung cancer.  I have recommended deferring any treatment for an additional 84months.  I did review symptoms of carotid disease and he knows to report immediately should this occur.  Otherwise we will see him at that time and will do duplex to assure that he has not had an asymptomatic occlusion of his high-grade right carotid restenosis.    Rosetta Posner, MD FACS [facility] Tel [phone] Pager [phone]

## 2017-04-19 ENCOUNTER — Encounter: Payer: Self-pay | Admitting: Family Medicine

## 2017-04-19 ENCOUNTER — Ambulatory Visit (INDEPENDENT_AMBULATORY_CARE_PROVIDER_SITE_OTHER): Payer: Medicare Other | Admitting: Family Medicine

## 2017-04-19 VITALS — BP 110/68 | Ht 67.0 in | Wt 130.1 lb

## 2017-04-19 DIAGNOSIS — I10 Essential (primary) hypertension: Secondary | ICD-10-CM | POA: Diagnosis not present

## 2017-04-19 DIAGNOSIS — M25651 Stiffness of right hip, not elsewhere classified: Secondary | ICD-10-CM | POA: Diagnosis not present

## 2017-04-19 DIAGNOSIS — R269 Unspecified abnormalities of gait and mobility: Secondary | ICD-10-CM | POA: Diagnosis not present

## 2017-04-19 DIAGNOSIS — E785 Hyperlipidemia, unspecified: Secondary | ICD-10-CM

## 2017-04-19 DIAGNOSIS — F0280 Dementia in other diseases classified elsewhere without behavioral disturbance: Secondary | ICD-10-CM | POA: Diagnosis not present

## 2017-04-19 DIAGNOSIS — G988 Other disorders of nervous system: Secondary | ICD-10-CM | POA: Diagnosis not present

## 2017-04-19 DIAGNOSIS — M25652 Stiffness of left hip, not elsewhere classified: Secondary | ICD-10-CM | POA: Diagnosis not present

## 2017-04-19 DIAGNOSIS — M62559 Muscle wasting and atrophy, not elsewhere classified, unspecified thigh: Secondary | ICD-10-CM | POA: Diagnosis not present

## 2017-04-19 DIAGNOSIS — I6529 Occlusion and stenosis of unspecified carotid artery: Secondary | ICD-10-CM

## 2017-04-19 DIAGNOSIS — F0390 Unspecified dementia without behavioral disturbance: Secondary | ICD-10-CM

## 2017-04-19 MED ORDER — ROSUVASTATIN CALCIUM 20 MG PO TABS: 20.0000 mg | ORAL_TABLET | Freq: Every day | ORAL | 11 refills | Status: DC

## 2017-04-19 NOTE — Progress Notes (Signed)
   Subjective:    Patient ID: [name] [name], male    DOB: [birth year], 76 y.o.   MRN: [id number]  HPI Patient is here to follow up on HTN. He is currently on Metoprolol 25 mg one half BID. He eats healthy and get plenty of exercise. Things are better per wife [name] [name] with the aggressive behavior and the leaving on his own.  Patient continues to take lipid medication regularly. No obvious side effects from it. Generally does not miss a dose. Prior blood work results are reviewed with patient. Patient continues to work on fat intake in diet  Blood pressure medicine and blood pressure levels reviewed today with patient. Compliant with blood pressure medicine. States does not miss a dose. No obvious side effects. Blood pressure generally good when checked elsewhere. Watching salt intake.   Continues to receive substantial aggressive therapy for his lung cancer.  Also continue to be followed by vascular surgeons and oncologist see notes  Patient's confusion has improved somewhat.  He is not driving.  Patient's wife is assisting with help from family.  Needs refills on Crestor. H  as had flu shot at the [facility]. Review of Systems No headache, no major weight loss or weight gain, no chest pain no back pain abdominal pain no change in bowel habits complete ROS otherwise negative     Objective:   Physical Exam  Alert and oriented, vitals reviewed and stable, NAD ENT-TM's and ext canals WNL bilat via otoscopic exam Soft palate, tonsils and post pharynx WNL via oropharyngeal exam Neck-symmetric, no masses; thyroid nonpalpable and nontender Pulmonary-no tachypnea or accessory muscle use; Clear without wheezes via auscultation Card--no abnrml murmurs, rhythm reg and rate WNL Carotid pulses symmetric, without bruits       Assessment & Plan:  Impression 1 hypertension good control discussed to maintain  2.  Hyperlipidemia discussed prior blood work reviewed maintain same  3.   Early/mild dementia.  Discussed with patient and family.  Family to maintain supportive issues.  Patient continued not to drive.  Also maintain Aricept  4.  Lung cancer managed by specialist  5.  Anticoagulation ongoing see prior notes  Follow-up as scheduled

## 2017-04-20 ENCOUNTER — Ambulatory Visit: Payer: Medicare Other | Admitting: Family Medicine

## 2017-04-21 DIAGNOSIS — M62559 Muscle wasting and atrophy, not elsewhere classified, unspecified thigh: Secondary | ICD-10-CM | POA: Diagnosis not present

## 2017-04-21 DIAGNOSIS — R269 Unspecified abnormalities of gait and mobility: Secondary | ICD-10-CM | POA: Diagnosis not present

## 2017-04-21 DIAGNOSIS — M25652 Stiffness of left hip, not elsewhere classified: Secondary | ICD-10-CM | POA: Diagnosis not present

## 2017-04-21 DIAGNOSIS — M25651 Stiffness of right hip, not elsewhere classified: Secondary | ICD-10-CM | POA: Diagnosis not present

## 2017-04-24 DIAGNOSIS — M25651 Stiffness of right hip, not elsewhere classified: Secondary | ICD-10-CM | POA: Diagnosis not present

## 2017-04-24 DIAGNOSIS — M62559 Muscle wasting and atrophy, not elsewhere classified, unspecified thigh: Secondary | ICD-10-CM | POA: Diagnosis not present

## 2017-04-24 DIAGNOSIS — M25652 Stiffness of left hip, not elsewhere classified: Secondary | ICD-10-CM | POA: Diagnosis not present

## 2017-04-24 DIAGNOSIS — R269 Unspecified abnormalities of gait and mobility: Secondary | ICD-10-CM | POA: Diagnosis not present

## 2017-04-25 DIAGNOSIS — R269 Unspecified abnormalities of gait and mobility: Secondary | ICD-10-CM | POA: Diagnosis not present

## 2017-04-25 DIAGNOSIS — M62559 Muscle wasting and atrophy, not elsewhere classified, unspecified thigh: Secondary | ICD-10-CM | POA: Diagnosis not present

## 2017-04-25 DIAGNOSIS — M25651 Stiffness of right hip, not elsewhere classified: Secondary | ICD-10-CM | POA: Diagnosis not present

## 2017-04-25 DIAGNOSIS — M25652 Stiffness of left hip, not elsewhere classified: Secondary | ICD-10-CM | POA: Diagnosis not present

## 2017-04-26 ENCOUNTER — Ambulatory Visit (HOSPITAL_BASED_OUTPATIENT_CLINIC_OR_DEPARTMENT_OTHER): Payer: Medicare Other | Admitting: Oncology

## 2017-04-26 ENCOUNTER — Encounter: Payer: Self-pay | Admitting: Oncology

## 2017-04-26 ENCOUNTER — Encounter: Payer: Self-pay | Admitting: *Deleted

## 2017-04-26 ENCOUNTER — Other Ambulatory Visit (HOSPITAL_BASED_OUTPATIENT_CLINIC_OR_DEPARTMENT_OTHER): Payer: Medicare Other

## 2017-04-26 ENCOUNTER — Ambulatory Visit (HOSPITAL_BASED_OUTPATIENT_CLINIC_OR_DEPARTMENT_OTHER): Payer: Medicare Other

## 2017-04-26 ENCOUNTER — Other Ambulatory Visit: Payer: Self-pay | Admitting: Internal Medicine

## 2017-04-26 VITALS — BP 130/63 | HR 62 | Resp 17 | Ht 67.0 in | Wt 131.9 lb

## 2017-04-26 DIAGNOSIS — C3411 Malignant neoplasm of upper lobe, right bronchus or lung: Secondary | ICD-10-CM

## 2017-04-26 DIAGNOSIS — C342 Malignant neoplasm of middle lobe, bronchus or lung: Secondary | ICD-10-CM

## 2017-04-26 DIAGNOSIS — Z5112 Encounter for antineoplastic immunotherapy: Secondary | ICD-10-CM | POA: Diagnosis present

## 2017-04-26 LAB — COMPREHENSIVE METABOLIC PANEL
ALK PHOS: 116 U/L (ref 40–150)
ALT: 12 U/L (ref 0–55)
ANION GAP: 9 meq/L (ref 3–11)
AST: 18 U/L (ref 5–34)
Albumin: 3.4 g/dL — ABNORMAL LOW (ref 3.5–5.0)
BILIRUBIN TOTAL: 0.42 mg/dL (ref 0.20–1.20)
BUN: 16.9 mg/dL (ref 7.0–26.0)
CO2: 24 meq/L (ref 22–29)
Calcium: 9.6 mg/dL (ref 8.4–10.4)
Chloride: 105 mEq/L (ref 98–109)
Creatinine: 0.7 mg/dL (ref 0.7–1.3)
GLUCOSE: 120 mg/dL (ref 70–140)
POTASSIUM: 3.7 meq/L (ref 3.5–5.1)
SODIUM: 138 meq/L (ref 136–145)
Total Protein: 7 g/dL (ref 6.4–8.3)

## 2017-04-26 LAB — CBC WITH DIFFERENTIAL/PLATELET
BASO%: 0.5 % (ref 0.0–2.0)
Basophils Absolute: 0 10*3/uL (ref 0.0–0.1)
EOS%: 2.3 % (ref 0.0–7.0)
Eosinophils Absolute: 0.1 10*3/uL (ref 0.0–0.5)
HEMATOCRIT: 38.5 % (ref 38.4–49.9)
HEMOGLOBIN: 12.8 g/dL — AB (ref 13.0–17.1)
LYMPH#: 0.9 10*3/uL (ref 0.9–3.3)
LYMPH%: 22.4 % (ref 14.0–49.0)
MCH: 28.6 pg (ref 27.2–33.4)
MCHC: 33.2 g/dL (ref 32.0–36.0)
MCV: 85.9 fL (ref 79.3–98.0)
MONO#: 0.3 10*3/uL (ref 0.1–0.9)
MONO%: 7.5 % (ref 0.0–14.0)
NEUT#: 2.7 10*3/uL (ref 1.5–6.5)
NEUT%: 67.3 % (ref 39.0–75.0)
PLATELETS: 144 10*3/uL (ref 140–400)
RBC: 4.48 10*6/uL (ref 4.20–5.82)
RDW: 15.2 % — AB (ref 11.0–14.6)
WBC: 4 10*3/uL (ref 4.0–10.3)

## 2017-04-26 MED ORDER — SODIUM CHLORIDE 0.9 % IV SOLN
10.0000 mg/kg | Freq: Once | INTRAVENOUS | Status: AC
  Administered 2017-04-26: 620 mg via INTRAVENOUS
  Filled 2017-04-26: qty 2.4

## 2017-04-26 MED ORDER — SODIUM CHLORIDE 0.9 % IV SOLN
Freq: Once | INTRAVENOUS | Status: AC
  Administered 2017-04-26: 09:00:00 via INTRAVENOUS

## 2017-04-26 NOTE — Progress Notes (Signed)
[name] [name] OFFICE PROGRESS NOTE  [name] Kirschner, MD [address] [place] [address]  DIAGNOSIS: Stage IIIA (T1b, N2, M0) non-small cell lung cancer, adenocarcinoma with positive PDL 1 expression 50% diagnosed in March 2018 and presented with right upper lobe lung nodule in addition to mediastinal lymphadenopathy.  PRIOR THERAPY: Course of concurrent chemoradiation with weekly carboplatin for AUC of 2 and paclitaxel 45 MG/M2. First dose of chemotherapy 10/17/2016. Status post 8 cycles. Last dose was given 12/05/2016 with partial response.  CURRENT THERAPY: Consolidation treatment with immunotherapy with Imfinzi (Durvalumab) 10 MG/KG every 2 weeks. First dose 01/18/2017. Status post 7 cycles.  INTERVAL HISTORY: [name] [name] 76 y.o. male returns for a routine follow-up visit accompanied by his wife. The patient has been tolerating his treatment with Imfinzi (Durvalumab) fairly well with no significant adverse effects. He denied having any skin rash or diarrhea. He has no nausea, vomiting, diarrhea or constipation. He has no weight loss or night sweats. The patient continues to have occasional confusion. He denied having any chest pain, shortness of breath, cough or hemoptysis. He is here for evaluation prior to cycle #8 of his treatment.  MEDICAL HISTORY: Past Medical History:  Diagnosis Date  . Adenocarcinoma of right lung, stage 3 ([facility]) 09/20/2016  . Atrial fibrillation ([facility])    no hx of reported at preop visit of 04/21/11   . CAD (coronary artery disease)   . Carotid artery occlusion    left carotid endarterectomy  . Chronic kidney disease    AAA repair with reimplant of renals   . CHRONIC OBSTRUCTIVE PULMONARY DISEASE    pt denied at visit of 04/21/11   . CORONARY ARTERY DISEASE   . Dehydration 11/15/2016  . Disorder of left sacroiliac joint 08/24/2016  . DIVERTICULOSIS OF COLON   . Encounter for antineoplastic chemotherapy 10/17/2016  . GERD   . H/O  hiatal hernia   . Headache(784.0)    Hx: of years of years  . HYPERTENSION   . HYPOTHYROIDISM   . JOINT EFFUSION, KNEE   . KNEE, ARTHRITIS, DEGEN./OSTEO    right knee   . Myocardial infarction ([facility])    1994   . NEPHROLITHIASIS, HX OF   . OSTEOPOROSIS   . Other dysphagia   . PERIPHERAL VASCULAR DISEASE    AAA - 1994 with reimplant of renals   . Peripheral vascular disease ([facility])    subclavian stenosis PTA - 3/08   . Pure hypercholesterolemia   . Renal artery stenosis ([facility])     ALLERGIES:  is allergic to neurontin [gabapentin]; imitrex [sumatriptan]; and lipitor [atorvastatin].  MEDICATIONS:  Current Outpatient Medications  Medication Sig Dispense Refill  . acetaminophen (TYLENOL) 325 MG tablet Take 325-650 mg by mouth every 6 (six) hours as needed for headache (for pain).     [name] ALPRAZolam (XANAX) 0.25 MG tablet Take 1 tablet (0.25 mg total) by mouth 3 (three) times daily as needed for anxiety. 30 tablet 0  . bumetanide (BUMEX) 1 MG tablet Take 1 tablet (1 mg total) by mouth daily as needed (swelling). 30 tablet 1  . donepezil (ARICEPT) 5 MG tablet Take 1 tablet (5 mg total) by mouth at bedtime. 90 tablet 1  . lansoprazole (PREVACID) 15 MG capsule Take 1 capsule (15 mg total) by mouth daily. 30 capsule 5  . levothyroxine (SYNTHROID, LEVOTHROID) 112 MCG tablet Take 1 tablet (112 mcg total) by mouth daily. 90 tablet 3  . linaclotide (LINZESS) 145 MCG CAPS  capsule Take 1 capsule (145 mcg total) by mouth daily before breakfast. 30 capsule 11  . magic mouthwash w/lidocaine SOLN Composition: 80 cc each of Extra St. Maalox Plus, Diphenhydramine, and Nystatin. Plus 240 cc of 2% viscous lidocaine to make 480 cc. Swish and swallow 2 tsp four times per day. 480 mL 2  . metoprolol tartrate (LOPRESSOR) 25 MG tablet TAKE ONE-HALF TABLET BY MOUTH TWO TIMES DAILY. 30 tablet 5  . niacin (SLO-NIACIN) 500 MG tablet Take 3 tablets (1,500 mg total) by mouth at bedtime. 30 tablet 0  . nitroGLYCERIN  (NITROSTAT) 0.4 MG SL tablet Place 1 tablet (0.4 mg total) under the tongue every 5 (five) minutes as needed for chest pain. 25 tablet 10  . ondansetron (ZOFRAN) 8 MG tablet Take 1 tablet (8 mg total) by mouth every 8 (eight) hours as needed for nausea or vomiting. 20 tablet 0  . potassium chloride SA (K-DUR,KLOR-CON) 20 MEQ tablet Take 1 tablet (20 mEq total) by mouth daily. 90 tablet 0  . prochlorperazine (COMPAZINE) 10 MG tablet Take 1 tablet (10 mg total) by mouth every 6 (six) hours as needed for nausea or vomiting. 30 tablet 0  . rosuvastatin (CRESTOR) 20 MG tablet Take 1 tablet (20 mg total) by mouth daily. 30 tablet 11  . sucralfate (CARAFATE) 1 GM/10ML suspension Take 10 mLs (1 g total) by mouth 4 (four) times daily -  with meals and at bedtime. 420 mL 1  . tamsulosin (FLOMAX) 0.4 MG CAPS capsule Take 1 capsule (0.4 mg total) by mouth daily after breakfast. 30 capsule 1  . warfarin (COUMADIN) 5 MG tablet Take one tablet daily. take as directed by physician. 90 tablet 1   No current facility-administered medications for this visit.     SURGICAL HISTORY:  Past Surgical History:  Procedure Laterality Date  . ABDOMINAL AORTIC ANEURYSM REPAIR     with reimplantation of renals   . CARDIAC CATHETERIZATION     2008  . CAROTID ENDARTERECTOMY Left Jan. 30, 2015   CEA  . CHOLECYSTECTOMY  2000   Gall Bladder  . COLONOSCOPY W/ BIOPSIES AND POLYPECTOMY     Hx: of  . CORONARY ARTERY BYPASS GRAFT     1995  . CORONARY STENT PLACEMENT     Hx: of  . GALLBLADDER SURGERY  2004   . JOINT REPLACEMENT     partial knee replacement on left 2002   . KNEE SURGERY    . OTHER SURGICAL HISTORY     left subclavian stenosis surgery PTA 08/2006   . OTHER SURGICAL HISTORY     carotid surgery on right 2004   . urinary retention    . VASCULAR SURGERY     AAA  . VASECTOMY  1973    REVIEW OF SYSTEMS:   Review of Systems  Constitutional: Negative for appetite change, chills, fatigue, fever and  unexpected weight change.  HENT:   Negative for mouth sores, nosebleeds, sore throat and trouble swallowing.   Eyes: Negative for eye problems and icterus.  Respiratory: Negative for cough, hemoptysis, shortness of breath and wheezing.   Cardiovascular: Negative for chest pain and leg swelling.  Gastrointestinal: Negative for abdominal pain, constipation, diarrhea, nausea and vomiting.  Genitourinary: Negative for bladder incontinence, difficulty urinating, dysuria, frequency and hematuria.   Musculoskeletal: Negative for back pain, gait problem, neck pain and neck stiffness.  Skin: Negative for itching and rash.  Neurological: Negative for dizziness, extremity weakness, gait problem, headaches, light-headedness and seizures.  Hematological:  Negative for adenopathy. Does not bruise/bleed easily.  Psychiatric/Behavioral: Negative for confusion, depression and sleep disturbance. The patient is not nervous/anxious.     PHYSICAL EXAMINATION:  Blood pressure 130/63, pulse 62, resp. rate 17, height 5\' 7"  (1.702 m), weight 131 lb 14.4 oz (59.8 kg), SpO2 100 %.  ECOG PERFORMANCE STATUS: 1 - Symptomatic but completely ambulatory  Physical Exam  Constitutional: Oriented to person, place, and time and well-developed, well-nourished, and in no distress. No distress.  HENT:  Head: Normocephalic and atraumatic.  Mouth/Throat: Oropharynx is clear and moist. No oropharyngeal exudate.  Eyes: Conjunctivae are normal. Right eye exhibits no discharge. Left eye exhibits no discharge. No scleral icterus.  Neck: Normal range of motion. Neck supple.  Cardiovascular: Normal rate, regular rhythm, normal heart sounds and intact distal pulses.   Pulmonary/Chest: Effort normal and breath sounds normal. No respiratory distress. No wheezes. No rales.  Abdominal: Soft. Bowel sounds are normal. Exhibits no distension and no mass. There is no tenderness.  Musculoskeletal: Normal range of motion. Exhibits no edema.   Lymphadenopathy:    No cervical adenopathy.  Neurological: Alert and oriented to person, place, and time. Exhibits normal muscle tone. Gait normal. Coordination normal.  Skin: Skin is warm and dry. No rash noted. Not diaphoretic. No erythema. No pallor.  Psychiatric: Mood and judgment normal.  Vitals reviewed.  LABORATORY DATA: Lab Results  Component Value Date   WBC 4.0 04/26/2017   HGB 12.8 (L) 04/26/2017   HCT 38.5 04/26/2017   MCV 85.9 04/26/2017   PLT 144 04/26/2017      Chemistry      Component Value Date/Time   NA 138 04/26/2017 0827   K 3.7 04/26/2017 0827   CL 100 (L) 10/06/2016 0611   CO2 24 04/26/2017 0827   BUN 16.9 04/26/2017 0827   CREATININE 0.7 04/26/2017 0827      Component Value Date/Time   CALCIUM 9.6 04/26/2017 0827   ALKPHOS 116 04/26/2017 0827   AST 18 04/26/2017 0827   ALT 12 04/26/2017 0827   BILITOT 0.42 04/26/2017 0827       RADIOGRAPHIC STUDIES:  Ct Chest W Contrast  Result Date: 04/10/2017 CLINICAL DATA:  Staging right middle lobe lung cancer. Post chemotherapy. No current complaints. EXAM: CT CHEST WITH CONTRAST TECHNIQUE: Multidetector CT imaging of the chest was performed during intravenous contrast administration. CONTRAST:  50mL ISOVUE-300 IOPAMIDOL (ISOVUE-300) INJECTION 61% COMPARISON:  PET-CT 09/29/2016.  Chest CT 08/22/2016 and 12/30/2016. FINDINGS: Cardiovascular: Severe atherosclerosis of aorta, great vessels and coronary arteries status post median sternotomy and CABG. There is a left subclavian stent. No acute vascular findings are evident. Stable mild cardiomegaly. No significant pericardial effusion. Mediastinum/Nodes: There are no enlarged mediastinal, hilar or axillary lymph nodes. The thyroid gland, trachea and esophagus demonstrate no significant findings. Lungs/Pleura: Trace pleural fluid or thickening on the right. There are progressive paramediastinal radiation changes in the right perihilar region, obscuring the previously  demonstrated spiculated right middle lobe nodule. There is associated architectural distortion and volume loss in the right hemithorax. 4 mm subpleural right upper lobe nodule on image 33 is stable. A sub solid subpleural nodule posteriorly in the left upper lobe is stable, measuring 10 mm on image 39. No new or enlarging pulmonary nodules identified. Underlying mild emphysema noted. Upper abdomen: The visualized upper abdomen appears stable without suspicious findings. Musculoskeletal/Chest wall: There is no chest wall mass or suspicious osseous finding. There are stable compression deformities at T6 and T8. IMPRESSION: 1. Progressive radiated changes  in the right paramediastinal lung. These obscure the previously demonstrated right middle lobe nodule. 2. Stable mild upper lobe nodularity bilaterally. No evidence of metastatic disease. 3. Extensive atherosclerosis, including Aortic Atherosclerosis (ICD10-I70.0). 4. Stable compression deformities at T6 and T8. Electronically Signed   By: Richardean Sale M.D.   On: 04/10/2017 15:12     ASSESSMENT/PLAN:  T1b N2 M0 disease (stage IIIA).adenocarcinoma of the right middle lobe of lung [facility]) This is a very pleasant 76 year old white male with a stage IIIa non-small cell lung cancer, adenocarcinoma with positive PDL 1 expression of 50%. The patient completed a course of concurrent chemoradiation with weekly carboplatin and paclitaxel status post 8 weeks and tolerated his treatment well except for mild dysphagia and odynophagia. He is currently undergoing consolidation immunotherapy with Imfinzi (Durvalumab) status post 7 cycles. The patient continues to tolerate his treatment fairly well with no significant adverse effects. Recommend that he proceed with cycle 8 of his treatment today as scheduled.  Follow-up visit will be in 2 weeks for evaluation before the next dose of his treatment.  He was advised to call immediately if he has any concerning symptoms in  the interval. The patient voices understanding of current disease status and treatment options and is in agreement with the current care plan. All questions were answered. The patient knows to call the clinic with any problems, questions or concerns. We can certainly see the patient much sooner if necessary.  No orders of the defined types were placed in this encounter.  [name] Bussing, DNP, AGPCNP-BC, AOCNP 04/26/17

## 2017-04-26 NOTE — Assessment & Plan Note (Signed)
This is a very pleasant 76 year old white male with a stage IIIa non-small cell lung cancer, adenocarcinoma with positive PDL 1 expression of 50%. The patient completed a course of concurrent chemoradiation with weekly carboplatin and paclitaxel status post 8 weeks and tolerated his treatment well except for mild dysphagia and odynophagia. He is currently undergoing consolidation immunotherapy with Imfinzi (Durvalumab) status post 7 cycles. The patient continues to tolerate his treatment fairly well with no significant adverse effects. Recommend that he proceed with cycle 8 of his treatment today as scheduled.  Follow-up visit will be in 2 weeks for evaluation before the next dose of his treatment.  He was advised to call immediately if he has any concerning symptoms in the interval. The patient voices understanding of current disease status and treatment options and is in agreement with the current care plan. All questions were answered. The patient knows to call the clinic with any problems, questions or concerns. We can certainly see the patient much sooner if necessary.

## 2017-04-26 NOTE — Patient Instructions (Addendum)
[facility] Discharge Instructions for Patients Receiving Chemotherapy  Today you received the following chemotherapy agents Imfinzi (Durvalumab). To help prevent nausea and vomiting after your treatment, we encourage you to take your nausea medication as prescribed.  If you develop nausea and vomiting that is not controlled by your nausea medication, call the clinic.   BELOW ARE SYMPTOMS THAT SHOULD BE REPORTED IMMEDIATELY:  *FEVER GREATER THAN 100.5 F  *CHILLS WITH OR WITHOUT FEVER  NAUSEA AND VOMITING THAT IS NOT CONTROLLED WITH YOUR NAUSEA MEDICATION  *UNUSUAL SHORTNESS OF BREATH  *UNUSUAL BRUISING OR BLEEDING  TENDERNESS IN MOUTH AND THROAT WITH OR WITHOUT PRESENCE OF ULCERS  *URINARY PROBLEMS  *BOWEL PROBLEMS  UNUSUAL RASH Items with * indicate a potential emergency and should be followed up as soon as possible.  Feel free to call the clinic should you have any questions or concerns. The clinic phone number is [phone].  Please show the [facility] at check-in to the Emergency Department and triage nurse.

## 2017-05-01 ENCOUNTER — Telehealth: Payer: Self-pay | Admitting: Internal Medicine

## 2017-05-01 NOTE — Telephone Encounter (Signed)
Added December appointments per 11/7 los. Left message for patient and also confirmed 11/21 appointments. Patient to get updated schedule at 11/21 visit.

## 2017-05-02 ENCOUNTER — Ambulatory Visit (INDEPENDENT_AMBULATORY_CARE_PROVIDER_SITE_OTHER): Payer: Medicare Other

## 2017-05-02 DIAGNOSIS — Z7901 Long term (current) use of anticoagulants: Secondary | ICD-10-CM

## 2017-05-02 LAB — POCT INR: INR: 1.6

## 2017-05-02 NOTE — Patient Instructions (Signed)
Take 1.5 tablets on Tuedays and 1 tablet on all other days

## 2017-05-03 NOTE — Addendum Note (Signed)
Addended by: Lianne Cure A on: 05/03/2017 11:23 AM   Modules accepted: Orders

## 2017-05-10 ENCOUNTER — Ambulatory Visit (HOSPITAL_BASED_OUTPATIENT_CLINIC_OR_DEPARTMENT_OTHER): Payer: Medicare Other

## 2017-05-10 ENCOUNTER — Other Ambulatory Visit: Payer: Self-pay | Admitting: *Deleted

## 2017-05-10 ENCOUNTER — Encounter: Payer: Self-pay | Admitting: Internal Medicine

## 2017-05-10 ENCOUNTER — Ambulatory Visit (HOSPITAL_BASED_OUTPATIENT_CLINIC_OR_DEPARTMENT_OTHER): Payer: Medicare Other | Admitting: Internal Medicine

## 2017-05-10 ENCOUNTER — Other Ambulatory Visit: Payer: Self-pay | Admitting: Medical Oncology

## 2017-05-10 ENCOUNTER — Other Ambulatory Visit (HOSPITAL_BASED_OUTPATIENT_CLINIC_OR_DEPARTMENT_OTHER): Payer: Medicare Other

## 2017-05-10 VITALS — BP 117/62 | HR 73 | Temp 97.8°F | Resp 18 | Ht 67.0 in | Wt 131.0 lb

## 2017-05-10 VITALS — Wt 133.5 lb

## 2017-05-10 DIAGNOSIS — Z79899 Other long term (current) drug therapy: Secondary | ICD-10-CM | POA: Diagnosis not present

## 2017-05-10 DIAGNOSIS — Z923 Personal history of irradiation: Secondary | ICD-10-CM

## 2017-05-10 DIAGNOSIS — C342 Malignant neoplasm of middle lobe, bronchus or lung: Secondary | ICD-10-CM

## 2017-05-10 DIAGNOSIS — Z9221 Personal history of antineoplastic chemotherapy: Secondary | ICD-10-CM

## 2017-05-10 DIAGNOSIS — Z5112 Encounter for antineoplastic immunotherapy: Secondary | ICD-10-CM | POA: Diagnosis present

## 2017-05-10 DIAGNOSIS — E039 Hypothyroidism, unspecified: Secondary | ICD-10-CM

## 2017-05-10 LAB — COMPREHENSIVE METABOLIC PANEL
ALT: 15 U/L (ref 0–55)
AST: 21 U/L (ref 5–34)
Albumin: 3.4 g/dL — ABNORMAL LOW (ref 3.5–5.0)
Alkaline Phosphatase: 116 U/L (ref 40–150)
Anion Gap: 10 mEq/L (ref 3–11)
BUN: 18.2 mg/dL (ref 7.0–26.0)
CHLORIDE: 103 meq/L (ref 98–109)
CO2: 23 meq/L (ref 22–29)
CREATININE: 0.8 mg/dL (ref 0.7–1.3)
Calcium: 9.6 mg/dL (ref 8.4–10.4)
EGFR: >60 mL/min/{1.73_m2} (ref >60)
GLUCOSE: 130 mg/dL (ref 70–140)
Potassium: 3.8 mEq/L (ref 3.5–5.1)
Sodium: 136 mEq/L (ref 136–145)
TOTAL PROTEIN: 6.9 g/dL (ref 6.4–8.3)
Total Bilirubin: 0.44 mg/dL (ref 0.20–1.20)

## 2017-05-10 LAB — CBC WITH DIFFERENTIAL/PLATELET
BASO%: 0.4 % (ref 0.0–2.0)
BASOS ABS: 0 10*3/uL (ref 0.0–0.1)
EOS ABS: 0.1 10*3/uL (ref 0.0–0.5)
EOS%: 1.3 % (ref 0.0–7.0)
HCT: 40.6 % (ref 38.4–49.9)
HGB: 14 g/dL (ref 13.0–17.1)
LYMPH%: 15 % (ref 14.0–49.0)
MCH: 29.5 pg (ref 27.2–33.4)
MCHC: 34.5 g/dL (ref 32.0–36.0)
MCV: 85.7 fL (ref 79.3–98.0)
MONO#: 0.2 10*3/uL (ref 0.1–0.9)
MONO%: 4.4 % (ref 0.0–14.0)
NEUT%: 78.9 % — ABNORMAL HIGH (ref 39.0–75.0)
NEUTROS ABS: 4.2 10*3/uL (ref 1.5–6.5)
PLATELETS: 141 10*3/uL (ref 140–400)
RBC: 4.74 10*6/uL (ref 4.20–5.82)
RDW: 15.4 % — ABNORMAL HIGH (ref 11.0–14.6)
WBC: 5.3 10*3/uL (ref 4.0–10.3)
lymph#: 0.8 10*3/uL — ABNORMAL LOW (ref 0.9–3.3)

## 2017-05-10 LAB — RESEARCH LABS

## 2017-05-10 LAB — TSH: TSH: 0.297 m[IU]/L — AB (ref 0.320–4.118)

## 2017-05-10 MED ORDER — SODIUM CHLORIDE 0.9 % IV SOLN
Freq: Once | INTRAVENOUS | Status: AC
  Administered 2017-05-10: 11:00:00 via INTRAVENOUS

## 2017-05-10 MED ORDER — DURVALUMAB 500 MG/10ML IV SOLN
10.0000 mg/kg | Freq: Once | INTRAVENOUS | Status: AC
  Administered 2017-05-10: 620 mg via INTRAVENOUS
  Filled 2017-05-10: qty 10

## 2017-05-10 NOTE — Patient Instructions (Signed)
[facility] Discharge Instructions for Patients Receiving Chemotherapy  Today you received the following chemotherapy agents Imfinzi (Durvalumab). To help prevent nausea and vomiting after your treatment, we encourage you to take your nausea medication as prescribed.  If you develop nausea and vomiting that is not controlled by your nausea medication, call the clinic.   BELOW ARE SYMPTOMS THAT SHOULD BE REPORTED IMMEDIATELY:  *FEVER GREATER THAN 100.5 F  *CHILLS WITH OR WITHOUT FEVER  NAUSEA AND VOMITING THAT IS NOT CONTROLLED WITH YOUR NAUSEA MEDICATION  *UNUSUAL SHORTNESS OF BREATH  *UNUSUAL BRUISING OR BLEEDING  TENDERNESS IN MOUTH AND THROAT WITH OR WITHOUT PRESENCE OF ULCERS  *URINARY PROBLEMS  *BOWEL PROBLEMS  UNUSUAL RASH Items with * indicate a potential emergency and should be followed up as soon as possible.  Feel free to call the clinic should you have any questions or concerns. The clinic phone number is [phone].  Please show the [facility] at check-in to the Emergency Department and triage nurse.

## 2017-05-10 NOTE — Progress Notes (Signed)
[place] [phone]   [phone]  OFFICE PROGRESS NOTE  [name] Kirschner, MD [address] [place] [address]  DIAGNOSIS: Stage IIIA (T1b, N2, M0) non-small cell lung cancer, adenocarcinoma with positive PDL 1 expression 50% diagnosed in March 2018 and presented with right upper lobe lung nodule in addition to mediastinal lymphadenopathy.  PRIOR THERAPY: Course of concurrent chemoradiation with weekly carboplatin for AUC of 2 and paclitaxel 45 MG/M2. First dose of chemotherapy 10/17/2016. Status post 8 cycles. Last dose was given 12/05/2016 with partial response.[name]  CURRENT THERAPY: Consolidation treatment with immunotherapy with Imfinzi (Durvalumab) 10 MG/KG every 2 weeks. First dose 01/18/2017. Status post 8 cycles.  INTERVAL HISTORY: [name] [name] 76 y.o. male returns to the clinic today for follow-up visit accompanied by his wife.  The patient is feeling fine today with no specific complaints.  He continues to tolerate his treatment with Imfinzi (Durvalumab) fairly well.  Had few episodes of nausea and vomiting secondary to viral gastroenteritis several days ago but this did resolve spontaneously.  He denied having any significant diarrhea.  He has no fever or chills.  He has no nausea or vomiting.  He denied having any significant weight loss or night sweats.  He has no chest pain, shortness of breath, cough or hemoptysis.  The patient is here today for evaluation before starting cycle #9.  MEDICAL HISTORY: Past Medical History:  Diagnosis Date  . Adenocarcinoma of right lung, stage 3 ([place]) 09/20/2016  . Atrial fibrillation ([place])    no hx of reported at preop visit of 04/21/11   . CAD (coronary artery disease)   . Carotid artery occlusion    left carotid endarterectomy  . Chronic kidney disease    AAA repair with reimplant of renals   . CHRONIC OBSTRUCTIVE PULMONARY DISEASE    pt denied at visit of 04/21/11   . CORONARY  ARTERY DISEASE   . Dehydration 11/15/2016  . Disorder of left sacroiliac joint 08/24/2016  . DIVERTICULOSIS OF COLON   . Encounter for antineoplastic chemotherapy 10/17/2016  . GERD   . H/O hiatal hernia   . Headache(784.0)    Hx: of years of years  . HYPERTENSION   . HYPOTHYROIDISM   . JOINT EFFUSION, KNEE   . KNEE, ARTHRITIS, DEGEN./OSTEO    right knee   . Myocardial infarction ([place])    1994   . NEPHROLITHIASIS, HX OF   . OSTEOPOROSIS   . Other dysphagia   . PERIPHERAL VASCULAR DISEASE    AAA - 1994 with reimplant of renals   . Peripheral vascular disease ([place])    subclavian stenosis PTA - 3/08   . Pure hypercholesterolemia   . Renal artery stenosis ([facility])     ALLERGIES:  is allergic to neurontin [gabapentin]; imitrex [sumatriptan]; and lipitor [atorvastatin].  MEDICATIONS:  Current Outpatient Medications  Medication Sig Dispense Refill  . acetaminophen (TYLENOL) 325 MG tablet Take 325-650 mg by mouth every 6 (six) hours as needed for headache (for pain).     [name] ALPRAZolam (XANAX) 0.25 MG tablet Take 1 tablet (0.25 mg total) by mouth 3 (three) times daily as needed for anxiety. 30 tablet 0  . bumetanide (BUMEX) 1 MG tablet Take 1 tablet (1 mg total) by mouth daily as needed (swelling). 30 tablet 1  . donepezil (ARICEPT) 5 MG tablet Take 1 tablet (5 mg total) by mouth at bedtime. 90 tablet 1  . lansoprazole (PREVACID) 15 MG  capsule Take 1 capsule (15 mg total) by mouth daily. 30 capsule 5  . levothyroxine (SYNTHROID, LEVOTHROID) 112 MCG tablet Take 1 tablet (112 mcg total) by mouth daily. 90 tablet 3  . linaclotide (LINZESS) 145 MCG CAPS capsule Take 1 capsule (145 mcg total) by mouth daily before breakfast. 30 capsule 11  . magic mouthwash w/lidocaine SOLN Composition: 80 cc each of Extra St. Maalox Plus, Diphenhydramine, and Nystatin. Plus 240 cc of 2% viscous lidocaine to make 480 cc. Swish and swallow 2 tsp four times per day. 480 mL 2  . metoprolol tartrate (LOPRESSOR) 25 MG  tablet TAKE ONE-HALF TABLET BY MOUTH TWO TIMES DAILY. 30 tablet 5  . niacin (SLO-NIACIN) 500 MG tablet Take 3 tablets (1,500 mg total) by mouth at bedtime. 30 tablet 0  . nitroGLYCERIN (NITROSTAT) 0.4 MG SL tablet Place 1 tablet (0.4 mg total) under the tongue every 5 (five) minutes as needed for chest pain. 25 tablet 10  . ondansetron (ZOFRAN) 8 MG tablet Take 1 tablet (8 mg total) by mouth every 8 (eight) hours as needed for nausea or vomiting. 20 tablet 0  . potassium chloride SA (K-DUR,KLOR-CON) 20 MEQ tablet Take 1 tablet (20 mEq total) by mouth daily. 90 tablet 0  . prochlorperazine (COMPAZINE) 10 MG tablet Take 1 tablet (10 mg total) by mouth every 6 (six) hours as needed for nausea or vomiting. 30 tablet 0  . rosuvastatin (CRESTOR) 20 MG tablet Take 1 tablet (20 mg total) by mouth daily. 30 tablet 11  . sucralfate (CARAFATE) 1 GM/10ML suspension Take 10 mLs (1 g total) by mouth 4 (four) times daily -  with meals and at bedtime. 420 mL 1  . tamsulosin (FLOMAX) 0.4 MG CAPS capsule Take 1 capsule (0.4 mg total) by mouth daily after breakfast. 30 capsule 1  . warfarin (COUMADIN) 5 MG tablet Take one tablet daily. take as directed by physician. 90 tablet 1   No current facility-administered medications for this visit.     SURGICAL HISTORY:  Past Surgical History:  Procedure Laterality Date  . ABDOMINAL AORTIC ANEURYSM REPAIR     with reimplantation of renals   . CARDIAC CATHETERIZATION     2008  . CAROTID ENDARTERECTOMY Left Jan. 30, 2015   CEA  . CHOLECYSTECTOMY  2000   Gall Bladder  . COLONOSCOPY W/ BIOPSIES AND POLYPECTOMY     Hx: of  . CORONARY ARTERY BYPASS GRAFT     1995  . CORONARY STENT PLACEMENT     Hx: of  . ENDARTERECTOMY Left 07/19/2013   Procedure: ENDARTERECTOMY CAROTID;  Surgeon: Mal Misty, MD;  Location: [facility];  Service: Vascular;  Laterality: Left;  . GALLBLADDER SURGERY  2004   . JOINT REPLACEMENT     partial knee replacement on left 2002   . KNEE SURGERY     . ORIF PELVIC FRACTURE Left 08/25/2016   Procedure: OPEN REDUCTION INTERNAL FIXATION (ORIF) PELVIC FRACTURE; plate on front, SI screw on the back;  Surgeon: Altamese [place], MD;  Location: [facility];  Service: Orthopedics;  Laterality: Left;  . OTHER SURGICAL HISTORY     left subclavian stenosis surgery PTA 08/2006   . OTHER SURGICAL HISTORY     carotid surgery on right 2004   . TOTAL KNEE ARTHROPLASTY  04/29/2011   Procedure: TOTAL KNEE ARTHROPLASTY;  Surgeon: Gearlean Alf;  Location: [facility];  Service: Orthopedics;  Laterality: Right;  . urinary retention    . VASCULAR SURGERY  AAA  . [place]  . VIDEO BRONCHOSCOPY WITH ENDOBRONCHIAL ULTRASOUND [name] 09/09/2016   Procedure: VIDEO BRONCHOSCOPY WITH ENDOBRONCHIAL ULTRASOUND;  Surgeon: Grace Isaac, MD;  [facility];  Service: Thoracic;  Laterality: [name];    REVIEW OF SYSTEMS:  A comprehensive review of systems was negative except for: Constitutional: positive for fatigue   PHYSICAL EXAMINATION: General appearance: alert, cooperative, fatigued and no distress Head: Normocephalic, without obvious abnormality, atraumatic Neck: no adenopathy, no JVD, supple, symmetrical, trachea midline and thyroid not enlarged, symmetric, no tenderness/mass/nodules Lymph nodes: Cervical, supraclavicular, and axillary nodes normal. Resp: clear to auscultation bilaterally Back: symmetric, no curvature. ROM normal. No CVA tenderness. Cardio: regular rate and rhythm, S1, S2 normal, no murmur, click, rub or gallop GI: soft, non-tender; bowel sounds normal; no masses,  no organomegaly Extremities: extremities normal, atraumatic, no cyanosis or edema  ECOG PERFORMANCE STATUS: 1 - Symptomatic but completely ambulatory  Blood pressure 117/62, pulse 73, temperature 97.8 F (36.6 C), temperature source Oral, resp. rate 18, height 5\' 7"  (1.702 m), weight 131 lb (59.4 kg), SpO2 100 %.  LABORATORY DATA: Lab Results  Component Value Date   WBC 4.0  04/26/2017   HGB 12.8 (L) 04/26/2017   HCT 38.5 04/26/2017   MCV 85.9 04/26/2017   PLT 144 04/26/2017      Chemistry      Component Value Date/Time   NA 138 04/26/2017 0827   K 3.7 04/26/2017 0827   CL 100 (L) 10/06/2016 0611   CO2 24 04/26/2017 0827   BUN 16.9 04/26/2017 0827   CREATININE 0.7 04/26/2017 0827      Component Value Date/Time   CALCIUM 9.6 04/26/2017 0827   ALKPHOS 116 04/26/2017 0827   AST 18 04/26/2017 0827   ALT 12 04/26/2017 0827   BILITOT 0.42 04/26/2017 0827       RADIOGRAPHIC STUDIES: Ct Chest W Contrast  Result Date: 04/10/2017 CLINICAL DATA:  Staging right middle lobe lung cancer. Post chemotherapy. No current complaints. EXAM: CT CHEST WITH CONTRAST TECHNIQUE: Multidetector CT imaging of the chest was performed during intravenous contrast administration. CONTRAST:  47mL ISOVUE-300 IOPAMIDOL (ISOVUE-300) INJECTION 61% COMPARISON:  PET-CT 09/29/2016.  Chest CT 08/22/2016 and 12/30/2016. FINDINGS: Cardiovascular: Severe atherosclerosis of aorta, great vessels and coronary arteries status post median sternotomy and CABG. There is a left subclavian stent. No acute vascular findings are evident. Stable mild cardiomegaly. No significant pericardial effusion. Mediastinum/Nodes: There are no enlarged mediastinal, hilar or axillary lymph nodes. The thyroid gland, trachea and esophagus demonstrate no significant findings. Lungs/Pleura: Trace pleural fluid or thickening on the right. There are progressive paramediastinal radiation changes in the right perihilar region, obscuring the previously demonstrated spiculated right middle lobe nodule. There is associated architectural distortion and volume loss in the right hemithorax. 4 mm subpleural right upper lobe nodule on image 33 is stable. A sub solid subpleural nodule posteriorly in the left upper lobe is stable, measuring 10 mm on image 39. No new or enlarging pulmonary nodules identified. Underlying mild emphysema noted.  Upper abdomen: The visualized upper abdomen appears stable without suspicious findings. Musculoskeletal/Chest wall: There is no chest wall mass or suspicious osseous finding. There are stable compression deformities at T6 and T8. IMPRESSION: 1. Progressive radiated changes in the right paramediastinal lung. These obscure the previously demonstrated right middle lobe nodule. 2. Stable mild upper lobe nodularity bilaterally. No evidence of metastatic disease. 3. Extensive atherosclerosis, including Aortic Atherosclerosis (ICD10-I70.0). 4. Stable compression deformities at T6 and T8. Electronically Signed  By: Richardean Sale M.D.   On: 04/10/2017 15:12    ASSESSMENT AND PLAN:  This is a very pleasant 76 years old white male with a stage IIIa non-small cell lung cancer, adenocarcinoma with positive PDL 1 expression of 50%. The patient completed a course of concurrent chemoradiation with weekly carboplatin and paclitaxel status post 8 weeks and tolerated his treatment well except for mild dysphagia and odynophagia. He is currently undergoing consolidation immunotherapy with Imfinzi (Durvalumab) status post 8 cycles. He tolerated the last cycle of his treatment fairly well with no significant adverse effects. I recommended for the patient to proceed with cycle #9 today as a scheduled. We will see him back for follow-up visit in 2 weeks for evaluation before the next dose of his treatment. The patient was advised to call immediately if he has any concerning symptoms in the interval. The patient voices understanding of current disease status and treatment options and is in agreement with the current care plan. All questions were answered. The patient knows to call the clinic with any problems, questions or concerns. We can certainly see the patient much sooner if necessary.  Disclaimer: This note was dictated with voice recognition software. Similar sounding words can inadvertently be transcribed and may not  be corrected upon review.

## 2017-05-23 ENCOUNTER — Ambulatory Visit: Payer: Medicare Other

## 2017-05-24 ENCOUNTER — Ambulatory Visit (HOSPITAL_BASED_OUTPATIENT_CLINIC_OR_DEPARTMENT_OTHER): Payer: Medicare Other

## 2017-05-24 ENCOUNTER — Encounter: Payer: Self-pay | Admitting: Oncology

## 2017-05-24 ENCOUNTER — Ambulatory Visit (HOSPITAL_BASED_OUTPATIENT_CLINIC_OR_DEPARTMENT_OTHER): Payer: Medicare Other | Admitting: Oncology

## 2017-05-24 ENCOUNTER — Other Ambulatory Visit (HOSPITAL_BASED_OUTPATIENT_CLINIC_OR_DEPARTMENT_OTHER): Payer: Medicare Other

## 2017-05-24 VITALS — BP 115/53 | HR 73 | Temp 97.9°F | Resp 18 | Ht 67.0 in | Wt 131.1 lb

## 2017-05-24 DIAGNOSIS — Z5112 Encounter for antineoplastic immunotherapy: Secondary | ICD-10-CM

## 2017-05-24 DIAGNOSIS — C3411 Malignant neoplasm of upper lobe, right bronchus or lung: Secondary | ICD-10-CM

## 2017-05-24 DIAGNOSIS — C342 Malignant neoplasm of middle lobe, bronchus or lung: Secondary | ICD-10-CM

## 2017-05-24 LAB — COMPREHENSIVE METABOLIC PANEL
ALT: 14 U/L (ref 0–55)
AST: 20 U/L (ref 5–34)
Albumin: 3.5 g/dL (ref 3.5–5.0)
Alkaline Phosphatase: 128 U/L (ref 40–150)
Anion Gap: 8 mEq/L (ref 3–11)
BILIRUBIN TOTAL: 0.51 mg/dL (ref 0.20–1.20)
BUN: 12.9 mg/dL (ref 7.0–26.0)
CHLORIDE: 100 meq/L (ref 98–109)
CO2: 25 meq/L (ref 22–29)
CREATININE: 0.8 mg/dL (ref 0.7–1.3)
Calcium: 9.8 mg/dL (ref 8.4–10.4)
EGFR: >60 mL/min/{1.73_m2} (ref >60)
GLUCOSE: 120 mg/dL (ref 70–140)
Potassium: 4.3 mEq/L (ref 3.5–5.1)
SODIUM: 134 meq/L — AB (ref 136–145)
TOTAL PROTEIN: 7 g/dL (ref 6.4–8.3)

## 2017-05-24 LAB — CBC WITH DIFFERENTIAL/PLATELET
BASO%: 0.3 % (ref 0.0–2.0)
Basophils Absolute: 0 10*3/uL (ref 0.0–0.1)
EOS%: 0.4 % (ref 0.0–7.0)
Eosinophils Absolute: 0 10*3/uL (ref 0.0–0.5)
HCT: 37.5 % — ABNORMAL LOW (ref 38.4–49.9)
HGB: 12.8 g/dL — ABNORMAL LOW (ref 13.0–17.1)
LYMPH%: 6.5 % — AB (ref 14.0–49.0)
MCH: 29.3 pg (ref 27.2–33.4)
MCHC: 34.2 g/dL (ref 32.0–36.0)
MCV: 85.7 fL (ref 79.3–98.0)
MONO#: 0.3 10*3/uL (ref 0.1–0.9)
MONO%: 3.9 % (ref 0.0–14.0)
NEUT%: 88.9 % — AB (ref 39.0–75.0)
NEUTROS ABS: 7.2 10*3/uL — AB (ref 1.5–6.5)
Platelets: 164 10*3/uL (ref 140–400)
RBC: 4.38 10*6/uL (ref 4.20–5.82)
RDW: 15.4 % — ABNORMAL HIGH (ref 11.0–14.6)
WBC: 8.1 10*3/uL (ref 4.0–10.3)
lymph#: 0.5 10*3/uL — ABNORMAL LOW (ref 0.9–3.3)

## 2017-05-24 MED ORDER — SODIUM CHLORIDE 0.9 % IV SOLN
Freq: Once | INTRAVENOUS | Status: AC
  Administered 2017-05-24: 10:00:00 via INTRAVENOUS

## 2017-05-24 MED ORDER — SODIUM CHLORIDE 0.9% FLUSH
10.0000 mL | INTRAVENOUS | Status: DC | PRN
  Filled 2017-05-24: qty 10

## 2017-05-24 MED ORDER — HEPARIN SOD (PORK) LOCK FLUSH 100 UNIT/ML IV SOLN
500.0000 [IU] | Freq: Once | INTRAVENOUS | Status: DC | PRN
  Filled 2017-05-24: qty 5

## 2017-05-24 MED ORDER — SODIUM CHLORIDE 0.9 % IV SOLN
10.0000 mg/kg | Freq: Once | INTRAVENOUS | Status: AC
  Administered 2017-05-24: 620 mg via INTRAVENOUS
  Filled 2017-05-24: qty 10

## 2017-05-24 NOTE — Progress Notes (Signed)
[facility] OFFICE PROGRESS NOTE  [name], MD [address] [place] [address]  DIAGNOSIS: Stage IIIA (T1b, N2, M0) non-small cell lung cancer, adenocarcinoma with positive PDL 1 expression 50% diagnosed in March 2018 and presented with right upper lobe lung nodule in addition to mediastinal lymphadenopathy.  PRIOR THERAPY: Course of concurrent chemoradiation with weekly carboplatin for AUC of 2 and paclitaxel 45 MG/M2. First dose of chemotherapy 10/17/2016. Status post 8 cycles. Last dose was given 12/05/2016 with partial response.  CURRENT THERAPY: Consolidation treatment with immunotherapy with Imfinzi (Durvalumab) 10 MG/KG every 2 weeks. First dose 01/18/2017. Status post 9 cycles.  INTERVAL HISTORY: [name] [name] 76 y.o. male returns for routine follow-up visit accompanied by his wife.  The patient is feeling fine today except for sinus drainage and cough which are improving.  Patient's wife thinks that he had a mild upper respiratory infection.  The patient denies fevers and chills.  Denies chest pain, shortness of breath, hemoptysis.  Denies nausea, vomiting, constipation, diarrhea.  The patient is here for evaluation prior to starting cycle #10 of his treatment.  MEDICAL HISTORY: Past Medical History:  Diagnosis Date  . Adenocarcinoma of right lung, stage 3 ([place]) 09/20/2016  . Atrial fibrillation ([place])    no hx of reported at preop visit of 04/21/11   . CAD (coronary artery disease)   . Carotid artery occlusion    left carotid endarterectomy  . Chronic kidney disease    AAA repair with reimplant of renals   . CHRONIC OBSTRUCTIVE PULMONARY DISEASE    pt denied at visit of 04/21/11   . CORONARY ARTERY DISEASE   . Dehydration 11/15/2016  . Disorder of left sacroiliac joint 08/24/2016  . DIVERTICULOSIS OF COLON   . Encounter for antineoplastic chemotherapy 10/17/2016  . GERD   . H/O hiatal hernia   . Headache(784.0)    Hx: of years of years  .  HYPERTENSION   . HYPOTHYROIDISM   . JOINT EFFUSION, KNEE   . KNEE, ARTHRITIS, DEGEN./OSTEO    right knee   . Myocardial infarction ([place])    1994   . NEPHROLITHIASIS, HX OF   . OSTEOPOROSIS   . Other dysphagia   . PERIPHERAL VASCULAR DISEASE    AAA - 1994 with reimplant of renals   . Peripheral vascular disease ([place])    subclavian stenosis PTA - 3/08   . Pure hypercholesterolemia   . Renal artery stenosis ([facility])     ALLERGIES:  is allergic to neurontin [gabapentin]; imitrex [sumatriptan]; and lipitor [atorvastatin].  MEDICATIONS:  Current Outpatient Medications  Medication Sig Dispense Refill  . acetaminophen (TYLENOL) 325 MG tablet Take 325-650 mg by mouth every 6 (six) hours as needed for headache (for pain).     [name] ALPRAZolam (XANAX) 0.25 MG tablet Take 1 tablet (0.25 mg total) by mouth 3 (three) times daily as needed for anxiety. 30 tablet 0  . bumetanide (BUMEX) 1 MG tablet Take 1 tablet (1 mg total) by mouth daily as needed (swelling). 30 tablet 1  . donepezil (ARICEPT) 5 MG tablet Take 1 tablet (5 mg total) by mouth at bedtime. 90 tablet 1  . lansoprazole (PREVACID) 15 MG capsule Take 1 capsule (15 mg total) by mouth daily. 30 capsule 5  . levothyroxine (SYNTHROID, LEVOTHROID) 112 MCG tablet Take 1 tablet (112 mcg total) by mouth daily. 90 tablet 3  . linaclotide (LINZESS) 145 MCG CAPS capsule Take 1 capsule (145 mcg total) by mouth  daily before breakfast. 30 capsule 11  . magic mouthwash w/lidocaine SOLN Composition: 80 cc each of Extra St. Maalox Plus, Diphenhydramine, and Nystatin. Plus 240 cc of 2% viscous lidocaine to make 480 cc. Swish and swallow 2 tsp four times per day. 480 mL 2  . metoprolol tartrate (LOPRESSOR) 25 MG tablet TAKE ONE-HALF TABLET BY MOUTH TWO TIMES DAILY. 30 tablet 5  . niacin (SLO-NIACIN) 500 MG tablet Take 3 tablets (1,500 mg total) by mouth at bedtime. 30 tablet 0  . nitroGLYCERIN (NITROSTAT) 0.4 MG SL tablet Place 1 tablet (0.4 mg total) under the  tongue every 5 (five) minutes as needed for chest pain. 25 tablet 10  . ondansetron (ZOFRAN) 8 MG tablet Take 1 tablet (8 mg total) by mouth every 8 (eight) hours as needed for nausea or vomiting. (Patient not taking: Reported on 05/10/2017) 20 tablet 0  . potassium chloride SA (K-DUR,KLOR-CON) 20 MEQ tablet Take 1 tablet (20 mEq total) by mouth daily. 90 tablet 0  . prochlorperazine (COMPAZINE) 10 MG tablet Take 1 tablet (10 mg total) by mouth every 6 (six) hours as needed for nausea or vomiting. (Patient not taking: Reported on 05/10/2017) 30 tablet 0  . rosuvastatin (CRESTOR) 20 MG tablet Take 1 tablet (20 mg total) by mouth daily. 30 tablet 11  . sucralfate (CARAFATE) 1 GM/10ML suspension Take 10 mLs (1 g total) by mouth 4 (four) times daily -  with meals and at bedtime. 420 mL 1  . tamsulosin (FLOMAX) 0.4 MG CAPS capsule Take 1 capsule (0.4 mg total) by mouth daily after breakfast. 30 capsule 1  . warfarin (COUMADIN) 5 MG tablet Take one tablet daily. take as directed by physician. 90 tablet 1   No current facility-administered medications for this visit.     SURGICAL HISTORY:  Past Surgical History:  Procedure Laterality Date  . ABDOMINAL AORTIC ANEURYSM REPAIR     with reimplantation of renals   . CARDIAC CATHETERIZATION     2008  . CAROTID ENDARTERECTOMY Left Jan. 30, 2015   CEA  . CHOLECYSTECTOMY  2000   Gall Bladder  . COLONOSCOPY W/ BIOPSIES AND POLYPECTOMY     Hx: of  . CORONARY ARTERY BYPASS GRAFT     1995  . CORONARY STENT PLACEMENT     Hx: of  . ENDARTERECTOMY Left 07/19/2013   Procedure: ENDARTERECTOMY CAROTID;  Surgeon: Mal Misty, MD;  Location: [facility];  Service: Vascular;  Laterality: Left;  . GALLBLADDER SURGERY  2004   . JOINT REPLACEMENT     partial knee replacement on left 2002   . KNEE SURGERY    . ORIF PELVIC FRACTURE Left 08/25/2016   Procedure: OPEN REDUCTION INTERNAL FIXATION (ORIF) PELVIC FRACTURE; plate on front, SI screw on the back;  Surgeon:  Altamese [place], MD;  Location: [facility];  Service: Orthopedics;  Laterality: Left;  . OTHER SURGICAL HISTORY     left subclavian stenosis surgery PTA 08/2006   . OTHER SURGICAL HISTORY     carotid surgery on right 2004   . TOTAL KNEE ARTHROPLASTY  04/29/2011   Procedure: TOTAL KNEE ARTHROPLASTY;  Surgeon: Gearlean Alf;  [facility];  Service: Orthopedics;  Laterality: Right;  . urinary retention    . VASCULAR SURGERY     AAA  . VASECTOMY  1973  . VIDEO BRONCHOSCOPY WITH ENDOBRONCHIAL ULTRASOUND [name] 09/09/2016   Procedure: VIDEO BRONCHOSCOPY WITH ENDOBRONCHIAL ULTRASOUND;  Surgeon: Grace Isaac, MD;  Location: [facility];  Service: Thoracic;  Laterality: [name];    REVIEW OF SYSTEMS:   Review of Systems  Constitutional: Negative for appetite change, chills, fatigue, fever and unexpected weight change.  HENT:   Negative for mouth sores, nosebleeds, sore throat and trouble swallowing.   Eyes: Negative for eye problems and icterus.  Respiratory: Negative for cough, hemoptysis, shortness of breath and wheezing.   Cardiovascular: Negative for chest pain and leg swelling.  Gastrointestinal: Negative for abdominal pain, constipation, diarrhea, nausea and vomiting.  Genitourinary: Negative for bladder incontinence, difficulty urinating, dysuria, frequency and hematuria.   Musculoskeletal: Negative for back pain, gait problem, neck pain and neck stiffness.  Skin: Negative for itching and rash.  Neurological: Negative for dizziness, extremity weakness, gait problem, headaches, light-headedness and seizures.  Hematological: Negative for adenopathy. Does not bruise/bleed easily.  Psychiatric/Behavioral: Negative for confusion, depression and sleep disturbance. The patient is not nervous/anxious.     PHYSICAL EXAMINATION:  Blood pressure (!) 115/53, pulse 73, temperature 97.9 F (36.6 C), temperature source Oral, resp. rate 18, height 5\' 7"  (1.702 m), weight 131 lb 1.6 oz (59.5 kg), SpO2 98  %.  ECOG PERFORMANCE STATUS: 1 - Symptomatic but completely ambulatory  Physical Exam  Constitutional: Oriented to person, place, and time and well-developed, well-nourished, and in no distress. No distress.  HENT:  Head: Normocephalic and atraumatic.  Mouth/Throat: Oropharynx is clear and moist. No oropharyngeal exudate.  Eyes: Conjunctivae are normal. Right eye exhibits no discharge. Left eye exhibits no discharge. No scleral icterus.  Neck: Normal range of motion. Neck supple.  Cardiovascular: Normal rate, regular rhythm, normal heart sounds and intact distal pulses.   Pulmonary/Chest: Effort normal and breath sounds normal. No respiratory distress. No wheezes. No rales.  Abdominal: Soft. Bowel sounds are normal. Exhibits no distension and no mass. There is no tenderness.  Musculoskeletal: Normal range of motion. Exhibits no edema.  Lymphadenopathy:    No cervical adenopathy.  Neurological: Alert and oriented to person, place, and time. Exhibits normal muscle tone. Gait normal. Coordination normal.  Skin: Skin is warm and dry. No rash noted. Not diaphoretic. No erythema. No pallor.  Psychiatric: Mood, memory and judgment normal.  Vitals reviewed.  LABORATORY DATA: Lab Results  Component Value Date   WBC 8.1 05/24/2017   HGB 12.8 (L) 05/24/2017   HCT 37.5 (L) 05/24/2017   MCV 85.7 05/24/2017   PLT 164 05/24/2017      Chemistry      Component Value Date/Time   NA 134 (L) 05/24/2017 0737   K 4.3 05/24/2017 0737   CL 100 (L) 10/06/2016 0611   CO2 25 05/24/2017 0737   BUN 12.9 05/24/2017 0737   CREATININE 0.8 05/24/2017 0737      Component Value Date/Time   CALCIUM 9.8 05/24/2017 0737   ALKPHOS 128 05/24/2017 0737   AST 20 05/24/2017 0737   ALT 14 05/24/2017 0737   BILITOT 0.51 05/24/2017 0737       RADIOGRAPHIC STUDIES:  No results found.   ASSESSMENT/PLAN:  T1b N2 M0 disease (stage IIIA).adenocarcinoma of the right middle lobe of lung [facility]) This is a very  pleasant 76 year old white male with a stage IIIa non-small cell lung cancer, adenocarcinoma with positive PDL 1 expression of 50%. The patient completed a course of concurrent chemoradiation with weekly carboplatin and paclitaxel status post 8 weeks and tolerated his treatment well except for mild dysphagia and odynophagia. He is currently undergoing consolidation immunotherapy with Imfinzi (Durvalumab) status post 9 cycles. He tolerated the last cycle of  his treatment fairly well with no significant adverse effects. I recommended for the patient to proceed with cycle #10 today as a scheduled.  We will see him back for follow-up visit in 2 weeks for evaluation before the next dose of his treatment.  For his cough and sinus drainage, we discussed use of over-the-counter medications including Delsym for the cough and Claritin or Zyrtec to help with the rhinorrhea.  The patient will let us know if he develops any fevers or any worsening of his symptoms.  The patient was advised to call immediately if he has any concerning symptoms in the interval. The patient voices understanding of current disease status and treatment options and is in agreement with the current care plan. All questions were answered. The patient knows to call the clinic with any problems, questions or concerns. We can certainly see the patient much sooner if necessary.  No orders of the defined types were placed in this encounter.  Mikey Bussing, DNP, AGPCNP-BC, AOCNP 05/24/17

## 2017-05-24 NOTE — Patient Instructions (Signed)
[facility] Discharge Instructions for Patients Receiving Chemotherapy  Today you received the following chemotherapy agents: Imfinzi.  To help prevent nausea and vomiting after your treatment, we encourage you to take your nausea medication as directed.   If you develop nausea and vomiting that is not controlled by your nausea medication, call the clinic.   BELOW ARE SYMPTOMS THAT SHOULD BE REPORTED IMMEDIATELY:  *FEVER GREATER THAN 100.5 F  *CHILLS WITH OR WITHOUT FEVER  NAUSEA AND VOMITING THAT IS NOT CONTROLLED WITH YOUR NAUSEA MEDICATION  *UNUSUAL SHORTNESS OF BREATH  *UNUSUAL BRUISING OR BLEEDING  TENDERNESS IN MOUTH AND THROAT WITH OR WITHOUT PRESENCE OF ULCERS  *URINARY PROBLEMS  *BOWEL PROBLEMS  UNUSUAL RASH Items with * indicate a potential emergency and should be followed up as soon as possible.  Feel free to call the clinic should you have any questions or concerns. The clinic phone number is [phone].  Please show the CHEMO ALERT CARD at check-in to the Emergency Department and triage nurse.   

## 2017-05-24 NOTE — Assessment & Plan Note (Signed)
This is a very pleasant 76 year old white male with a stage IIIa non-small cell lung cancer, adenocarcinoma with positive PDL 1 expression of 50%. The patient completed a course of concurrent chemoradiation with weekly carboplatin and paclitaxel status post 8 weeks and tolerated his treatment well except for mild dysphagia and odynophagia. He is currently undergoing consolidation immunotherapy with Imfinzi (Durvalumab) status post 9 cycles. He tolerated the last cycle of his treatment fairly well with no significant adverse effects. I recommended for the patient to proceed with cycle #10 today as a scheduled.  We will see him back for follow-up visit in 2 weeks for evaluation before the next dose of his treatment.  For his cough and sinus drainage, we discussed use of over-the-counter medications including Delsym for the cough and Claritin or Zyrtec to help with the rhinorrhea.  The patient will let us know if he develops any fevers or any worsening of his symptoms.  The patient was advised to call immediately if he has any concerning symptoms in the interval. The patient voices understanding of current disease status and treatment options and is in agreement with the current care plan. All questions were answered. The patient knows to call the clinic with any problems, questions or concerns. We can certainly see the patient much sooner if necessary.

## 2017-05-25 ENCOUNTER — Ambulatory Visit (INDEPENDENT_AMBULATORY_CARE_PROVIDER_SITE_OTHER): Payer: Medicare Other

## 2017-05-25 DIAGNOSIS — Z7901 Long term (current) use of anticoagulants: Secondary | ICD-10-CM | POA: Diagnosis not present

## 2017-05-25 LAB — POCT INR: INR: 2.3

## 2017-05-26 ENCOUNTER — Telehealth: Payer: Self-pay | Admitting: Internal Medicine

## 2017-05-26 NOTE — Telephone Encounter (Signed)
Spoke to patients daughter regarding upcoming January appointments.

## 2017-06-07 ENCOUNTER — Ambulatory Visit (HOSPITAL_BASED_OUTPATIENT_CLINIC_OR_DEPARTMENT_OTHER): Payer: Medicare Other | Admitting: Oncology

## 2017-06-07 ENCOUNTER — Ambulatory Visit (HOSPITAL_BASED_OUTPATIENT_CLINIC_OR_DEPARTMENT_OTHER): Payer: Medicare Other

## 2017-06-07 ENCOUNTER — Other Ambulatory Visit (HOSPITAL_BASED_OUTPATIENT_CLINIC_OR_DEPARTMENT_OTHER): Payer: Medicare Other

## 2017-06-07 VITALS — BP 118/50 | HR 61 | Temp 97.8°F | Resp 18 | Ht 67.0 in | Wt 130.1 lb

## 2017-06-07 DIAGNOSIS — C342 Malignant neoplasm of middle lobe, bronchus or lung: Secondary | ICD-10-CM | POA: Diagnosis not present

## 2017-06-07 DIAGNOSIS — Z5112 Encounter for antineoplastic immunotherapy: Secondary | ICD-10-CM | POA: Diagnosis not present

## 2017-06-07 DIAGNOSIS — E039 Hypothyroidism, unspecified: Secondary | ICD-10-CM

## 2017-06-07 DIAGNOSIS — Z79899 Other long term (current) drug therapy: Secondary | ICD-10-CM

## 2017-06-07 LAB — CBC WITH DIFFERENTIAL/PLATELET
BASO%: 0.8 % (ref 0.0–2.0)
BASOS ABS: 0 10*3/uL (ref 0.0–0.1)
EOS%: 1.6 % (ref 0.0–7.0)
Eosinophils Absolute: 0.1 10*3/uL (ref 0.0–0.5)
HCT: 36.8 % — ABNORMAL LOW (ref 38.4–49.9)
HGB: 12.7 g/dL — ABNORMAL LOW (ref 13.0–17.1)
LYMPH%: 17.4 % (ref 14.0–49.0)
MCH: 29.1 pg (ref 27.2–33.4)
MCHC: 34.5 g/dL (ref 32.0–36.0)
MCV: 84.3 fL (ref 79.3–98.0)
MONO#: 0.3 10*3/uL (ref 0.1–0.9)
MONO%: 5.7 % (ref 0.0–14.0)
NEUT#: 3.3 10*3/uL (ref 1.5–6.5)
NEUT%: 74.5 % (ref 39.0–75.0)
Platelets: 201 10*3/uL (ref 140–400)
RBC: 4.36 10*6/uL (ref 4.20–5.82)
RDW: 15.6 % — AB (ref 11.0–14.6)
WBC: 4.4 10*3/uL (ref 4.0–10.3)
lymph#: 0.8 10*3/uL — ABNORMAL LOW (ref 0.9–3.3)

## 2017-06-07 LAB — COMPREHENSIVE METABOLIC PANEL
ALBUMIN: 3.5 g/dL (ref 3.5–5.0)
ALT: 17 U/L (ref 0–55)
ANION GAP: 9 meq/L (ref 3–11)
AST: 22 U/L (ref 5–34)
Alkaline Phosphatase: 139 U/L (ref 40–150)
BILIRUBIN TOTAL: 0.54 mg/dL (ref 0.20–1.20)
BUN: 17.1 mg/dL (ref 7.0–26.0)
CO2: 24 meq/L (ref 22–29)
CREATININE: 0.8 mg/dL (ref 0.7–1.3)
Calcium: 9.6 mg/dL (ref 8.4–10.4)
Chloride: 103 mEq/L (ref 98–109)
EGFR: >60 mL/min/{1.73_m2} (ref >60)
GLUCOSE: 117 mg/dL (ref 70–140)
Potassium: 3.9 mEq/L (ref 3.5–5.1)
Sodium: 136 mEq/L (ref 136–145)
TOTAL PROTEIN: 7.1 g/dL (ref 6.4–8.3)

## 2017-06-07 LAB — TSH: TSH: 0.159 m[IU]/L — AB (ref 0.320–4.118)

## 2017-06-07 MED ORDER — SODIUM CHLORIDE 0.9 % IV SOLN
Freq: Once | INTRAVENOUS | Status: AC
  Administered 2017-06-07: 10:00:00 via INTRAVENOUS

## 2017-06-07 MED ORDER — SODIUM CHLORIDE 0.9 % IV SOLN
10.0000 mg/kg | Freq: Once | INTRAVENOUS | Status: AC
  Administered 2017-06-07: 620 mg via INTRAVENOUS
  Filled 2017-06-07: qty 10

## 2017-06-07 NOTE — Assessment & Plan Note (Signed)
This is a very pleasant 76 year old white male with a stage IIIa non-small cell lung cancer, adenocarcinoma with positive PDL 1 expression of 50%. The patient completed a course of concurrent chemoradiation with weekly carboplatin and paclitaxel status post 8 weeks and tolerated his treatment well except for mild dysphagia and odynophagia. He is currently undergoing consolidation immunotherapy with Imfinzi (Durvalumab) status post10cycles. He tolerated the last cycle of his treatment fairly well with no significant adverse effects. I recommended for the patient to proceed with cycle #11 today as a scheduled.  We will see him back for follow-up visit in 2 weeks for evaluation before the next dose of his treatment.  The patient will need to have a CT scan of the chest following his 12 cycle of treatment.  This will be ordered at his next visit.  The patient was advised to call immediately if he has any concerning symptoms in the interval. The patient voices understanding of current disease status and treatment options and is in agreement with the current care plan. All questions were answered. The patient knows to call the clinic with any problems, questions or concerns. We can certainly see the patient much sooner if necessary.

## 2017-06-07 NOTE — Progress Notes (Signed)
[facility] OFFICE PROGRESS NOTE  [name], MD [address] [place] [address]  DIAGNOSIS: Stage IIIA (T1b, N2, M0) non-small cell lung cancer, adenocarcinoma with positive PDL 1 expression 50% diagnosed in March 2018 and presented with right upper lobe lung nodule in addition to mediastinal lymphadenopathy.  PRIOR THERAPY: Course of concurrent chemoradiation with weekly carboplatin for AUC of 2 and paclitaxel 45 MG/M2. First dose of chemotherapy 10/17/2016. Status post 8 cycles. Last dose was given 12/05/2016 with partial response.  CURRENT THERAPY: Consolidation treatment with immunotherapy with Imfinzi (Durvalumab) 10 MG/KG every 2 weeks. First dose 01/18/2017. Status post10cycles.  INTERVAL HISTORY: [name] [name] 76 y.o. male returns for a routine follow-up visit accompanied by his wife.  The patient is feeling fine today and has no specific complaints. The patient denies fevers and chills.  Denies chest pain, shortness of breath, cough, and hemoptysis.  Denies nausea, vomiting, constipation, diarrhea.  The patient is here for evaluation prior to starting cycle #11 of his treatment.  MEDICAL HISTORY: Past Medical History:  Diagnosis Date  . Adenocarcinoma of right lung, stage 3 ([facility]) 09/20/2016  . Atrial fibrillation ([facility])    no hx of reported at preop visit of 04/21/11   . CAD (coronary artery disease)   . Carotid artery occlusion    left carotid endarterectomy  . Chronic kidney disease    AAA repair with reimplant of renals   . CHRONIC OBSTRUCTIVE PULMONARY DISEASE    pt denied at visit of 04/21/11   . CORONARY ARTERY DISEASE   . Dehydration 11/15/2016  . Disorder of left sacroiliac joint 08/24/2016  . DIVERTICULOSIS OF COLON   . Encounter for antineoplastic chemotherapy 10/17/2016  . GERD   . H/O hiatal hernia   . Headache(784.0)    Hx: of years of years  . HYPERTENSION   . HYPOTHYROIDISM   . JOINT EFFUSION, KNEE   . KNEE, ARTHRITIS,  DEGEN./OSTEO    right knee   . Myocardial infarction ([facility])    1994   . NEPHROLITHIASIS, HX OF   . OSTEOPOROSIS   . Other dysphagia   . PERIPHERAL VASCULAR DISEASE    AAA - 1994 with reimplant of renals   . Peripheral vascular disease ([facility])    subclavian stenosis PTA - 3/08   . Pure hypercholesterolemia   . Renal artery stenosis ([facility])     ALLERGIES:  is allergic to neurontin [gabapentin]; imitrex [sumatriptan]; and lipitor [atorvastatin].  MEDICATIONS:  Current Outpatient Medications  Medication Sig Dispense Refill  . acetaminophen (TYLENOL) 325 MG tablet Take 325-650 mg by mouth every 6 (six) hours as needed for headache (for pain).     [name] ALPRAZolam (XANAX) 0.25 MG tablet Take 1 tablet (0.25 mg total) by mouth 3 (three) times daily as needed for anxiety. 30 tablet 0  . bumetanide (BUMEX) 1 MG tablet Take 1 tablet (1 mg total) by mouth daily as needed (swelling). 30 tablet 1  . donepezil (ARICEPT) 5 MG tablet Take 1 tablet (5 mg total) by mouth at bedtime. 90 tablet 1  . lansoprazole (PREVACID) 15 MG capsule Take 1 capsule (15 mg total) by mouth daily. 30 capsule 5  . levothyroxine (SYNTHROID, LEVOTHROID) 112 MCG tablet Take 1 tablet (112 mcg total) by mouth daily. 90 tablet 3  . linaclotide (LINZESS) 145 MCG CAPS capsule Take 1 capsule (145 mcg total) by mouth daily before breakfast. 30 capsule 11  . magic mouthwash w/lidocaine SOLN Composition: 80 cc each  of Extra St. Maalox Plus, Diphenhydramine, and Nystatin. Plus 240 cc of 2% viscous lidocaine to make 480 cc. Swish and swallow 2 tsp four times per day. 480 mL 2  . metoprolol tartrate (LOPRESSOR) 25 MG tablet TAKE ONE-HALF TABLET BY MOUTH TWO TIMES DAILY. 30 tablet 5  . niacin (SLO-NIACIN) 500 MG tablet Take 3 tablets (1,500 mg total) by mouth at bedtime. 30 tablet 0  . nitroGLYCERIN (NITROSTAT) 0.4 MG SL tablet Place 1 tablet (0.4 mg total) under the tongue every 5 (five) minutes as needed for chest pain. 25 tablet 10  .  ondansetron (ZOFRAN) 8 MG tablet Take 1 tablet (8 mg total) by mouth every 8 (eight) hours as needed for nausea or vomiting. (Patient not taking: Reported on 05/10/2017) 20 tablet 0  . potassium chloride SA (K-DUR,KLOR-CON) 20 MEQ tablet Take 1 tablet (20 mEq total) by mouth daily. 90 tablet 0  . prochlorperazine (COMPAZINE) 10 MG tablet Take 1 tablet (10 mg total) by mouth every 6 (six) hours as needed for nausea or vomiting. (Patient not taking: Reported on 05/10/2017) 30 tablet 0  . rosuvastatin (CRESTOR) 20 MG tablet Take 1 tablet (20 mg total) by mouth daily. 30 tablet 11  . sucralfate (CARAFATE) 1 GM/10ML suspension Take 10 mLs (1 g total) by mouth 4 (four) times daily -  with meals and at bedtime. 420 mL 1  . tamsulosin (FLOMAX) 0.4 MG CAPS capsule Take 1 capsule (0.4 mg total) by mouth daily after breakfast. 30 capsule 1  . warfarin (COUMADIN) 5 MG tablet Take one tablet daily. take as directed by physician. 90 tablet 1   No current facility-administered medications for this visit.    Facility-Administered Medications Ordered in Other Visits  Medication Dose Route Frequency Provider Last Rate Last Dose  . durvalumab (IMFINZI) 620 mg in sodium chloride 0.9 % 100 mL chemo infusion  10 mg/kg (Treatment Plan Recorded) Intravenous Once Curt Bears, MD        SURGICAL HISTORY:  Past Surgical History:  Procedure Laterality Date  . ABDOMINAL AORTIC ANEURYSM REPAIR     with reimplantation of renals   . CARDIAC CATHETERIZATION     2008  . CAROTID ENDARTERECTOMY Left Jan. 30, 2015   CEA  . CHOLECYSTECTOMY  2000   Gall Bladder  . COLONOSCOPY W/ BIOPSIES AND POLYPECTOMY     Hx: of  . CORONARY ARTERY BYPASS GRAFT     1995  . CORONARY STENT PLACEMENT     Hx: of  . ENDARTERECTOMY Left 07/19/2013   Procedure: ENDARTERECTOMY CAROTID;  Surgeon: Mal Misty, MD;  Location: [facility];  Service: Vascular;  Laterality: Left;  . GALLBLADDER SURGERY  2004   . JOINT REPLACEMENT     partial knee  replacement on left 2002   . KNEE SURGERY    . ORIF PELVIC FRACTURE Left 08/25/2016   Procedure: OPEN REDUCTION INTERNAL FIXATION (ORIF) PELVIC FRACTURE; plate on front, SI screw on the back;  Surgeon: Altamese [place], MD;  Location: [facility];  Service: Orthopedics;  Laterality: Left;  . OTHER SURGICAL HISTORY     left subclavian stenosis surgery PTA 08/2006   . OTHER SURGICAL HISTORY     carotid surgery on right 2004   . TOTAL KNEE ARTHROPLASTY  04/29/2011   Procedure: TOTAL KNEE ARTHROPLASTY;  Surgeon: Gearlean Alf;  Location: [facility];  Service: Orthopedics;  Laterality: Right;  . urinary retention    . VASCULAR SURGERY     AAA  . VASECTOMY  [facility] WITH ENDOBRONCHIAL ULTRASOUND [name] 09/09/2016   Procedure: VIDEO BRONCHOSCOPY WITH ENDOBRONCHIAL ULTRASOUND;  Surgeon: Grace Isaac, MD;  Location: [facility];  Service: Thoracic;  Laterality: [name];    REVIEW OF SYSTEMS:   Review of Systems  Constitutional: Negative for appetite change, chills, fatigue, fever and unexpected weight change.  HENT:   Negative for mouth sores, nosebleeds, sore throat and trouble swallowing.   Eyes: Negative for eye problems and icterus.  Respiratory: Negative for cough, hemoptysis, shortness of breath and wheezing.   Cardiovascular: Negative for chest pain and leg swelling.  Gastrointestinal: Negative for abdominal pain, constipation, diarrhea, nausea and vomiting.  Genitourinary: Negative for bladder incontinence, difficulty urinating, dysuria, frequency and hematuria.   Musculoskeletal: Negative for back pain, gait problem, neck pain and neck stiffness.  Skin: Negative for itching and rash.  Neurological: Negative for dizziness, extremity weakness, gait problem, headaches, light-headedness and seizures.  Hematological: Negative for adenopathy. Does not bruise/bleed easily.  Psychiatric/Behavioral: Negative for confusion, depression and sleep disturbance. The patient is not nervous/anxious.      PHYSICAL EXAMINATION:  Blood pressure (!) 118/50, pulse 61, temperature 97.8 F (36.6 C), temperature source Oral, resp. rate 18, height 5\' 7"  (1.702 m), weight 130 lb 1.6 oz (59 kg), SpO2 100 %.  ECOG PERFORMANCE STATUS: 1 - Symptomatic but completely ambulatory  Physical Exam  Constitutional: Oriented to person, place, and time and well-developed, well-nourished, and in no distress. No distress.  HENT:  Head: Normocephalic and atraumatic.  Mouth/Throat: Oropharynx is clear and moist. No oropharyngeal exudate.  Eyes: Conjunctivae are normal. Right eye exhibits no discharge. Left eye exhibits no discharge. No scleral icterus.  Neck: Normal range of motion. Neck supple.  Cardiovascular: Normal rate, regular rhythm, normal heart sounds and intact distal pulses.   Pulmonary/Chest: Effort normal and breath sounds normal. No respiratory distress. No wheezes. No rales.  Abdominal: Soft. Bowel sounds are normal. Exhibits no distension and no mass. There is no tenderness.  Musculoskeletal: Normal range of motion. Exhibits no edema.  Lymphadenopathy:    No cervical adenopathy.  Neurological: Alert and oriented to person, place, and time. Exhibits normal muscle tone. Gait normal. Coordination normal.  Skin: Skin is warm and dry. No rash noted. Not diaphoretic. No erythema. No pallor.  Psychiatric: Mood, memory and judgment normal.  Vitals reviewed.  LABORATORY DATA: Lab Results  Component Value Date   WBC 4.4 06/07/2017   HGB 12.7 (L) 06/07/2017   HCT 36.8 (L) 06/07/2017   MCV 84.3 06/07/2017   PLT 201 06/07/2017      Chemistry      Component Value Date/Time   NA 136 06/07/2017 0901   K 3.9 06/07/2017 0901   CL 100 (L) 10/06/2016 0611   CO2 24 06/07/2017 0901   BUN 17.1 06/07/2017 0901   CREATININE 0.8 06/07/2017 0901      Component Value Date/Time   CALCIUM 9.6 06/07/2017 0901   ALKPHOS 139 06/07/2017 0901   AST 22 06/07/2017 0901   ALT 17 06/07/2017 0901   BILITOT 0.54  06/07/2017 0901       RADIOGRAPHIC STUDIES:  No results found.   ASSESSMENT/PLAN:  T1b N2 M0 disease (stage IIIA).adenocarcinoma of the right middle lobe of lung [facility]) This is a very pleasant 76 year old white male with a stage IIIa non-small cell lung cancer, adenocarcinoma with positive PDL 1 expression of 50%. The patient completed a course of concurrent chemoradiation with weekly carboplatin and paclitaxel status post 8  weeks and tolerated his treatment well except for mild dysphagia and odynophagia. He is currently undergoing consolidation immunotherapy with Imfinzi (Durvalumab) status post10cycles. He tolerated the last cycle of his treatment fairly well with no significant adverse effects. I recommended for the patient to proceed with cycle #11 today as a scheduled.  We will see him back for follow-up visit in 2 weeks for evaluation before the next dose of his treatment.  The patient will need to have a CT scan of the chest following his 12 cycle of treatment.  This will be ordered at his next visit.  The patient was advised to call immediately if he has any concerning symptoms in the interval. The patient voices understanding of current disease status and treatment options and is in agreement with the current care plan. All questions were answered. The patient knows to call the clinic with any problems, questions or concerns. We can certainly see the patient much sooner if necessary.  No orders of the defined types were placed in this encounter.   Mikey Bussing, DNP, AGPCNP-BC, AOCNP 06/07/17

## 2017-06-07 NOTE — Patient Instructions (Signed)
[facility] Discharge Instructions for Patients Receiving Chemotherapy  Today you received the following chemotherapy agents: Imfinzi.  To help prevent nausea and vomiting after your treatment, we encourage you to take your nausea medication as directed.   If you develop nausea and vomiting that is not controlled by your nausea medication, call the clinic.   BELOW ARE SYMPTOMS THAT SHOULD BE REPORTED IMMEDIATELY:  *FEVER GREATER THAN 100.5 F  *CHILLS WITH OR WITHOUT FEVER  NAUSEA AND VOMITING THAT IS NOT CONTROLLED WITH YOUR NAUSEA MEDICATION  *UNUSUAL SHORTNESS OF BREATH  *UNUSUAL BRUISING OR BLEEDING  TENDERNESS IN MOUTH AND THROAT WITH OR WITHOUT PRESENCE OF ULCERS  *URINARY PROBLEMS  *BOWEL PROBLEMS  UNUSUAL RASH Items with * indicate a potential emergency and should be followed up as soon as possible.  Feel free to call the clinic should you have any questions or concerns. The clinic phone number is [phone].  Please show the CHEMO ALERT CARD at check-in to the Emergency Department and triage nurse.   

## 2017-06-15 ENCOUNTER — Other Ambulatory Visit: Payer: Self-pay | Admitting: Family Medicine

## 2017-06-21 ENCOUNTER — Inpatient Hospital Stay: Payer: Medicare Other | Attending: Nurse Practitioner | Admitting: Nurse Practitioner

## 2017-06-21 ENCOUNTER — Encounter: Payer: Self-pay | Admitting: Nurse Practitioner

## 2017-06-21 ENCOUNTER — Other Ambulatory Visit (HOSPITAL_BASED_OUTPATIENT_CLINIC_OR_DEPARTMENT_OTHER): Payer: Medicare Other

## 2017-06-21 ENCOUNTER — Ambulatory Visit (HOSPITAL_BASED_OUTPATIENT_CLINIC_OR_DEPARTMENT_OTHER): Payer: Medicare Other

## 2017-06-21 VITALS — BP 121/45 | HR 65 | Temp 97.7°F | Resp 18 | Wt 134.0 lb

## 2017-06-21 DIAGNOSIS — Z5112 Encounter for antineoplastic immunotherapy: Secondary | ICD-10-CM

## 2017-06-21 DIAGNOSIS — R1310 Dysphagia, unspecified: Secondary | ICD-10-CM | POA: Insufficient documentation

## 2017-06-21 DIAGNOSIS — C3411 Malignant neoplasm of upper lobe, right bronchus or lung: Secondary | ICD-10-CM

## 2017-06-21 DIAGNOSIS — I129 Hypertensive chronic kidney disease with stage 1 through stage 4 chronic kidney disease, or unspecified chronic kidney disease: Secondary | ICD-10-CM | POA: Insufficient documentation

## 2017-06-21 DIAGNOSIS — C342 Malignant neoplasm of middle lobe, bronchus or lung: Secondary | ICD-10-CM

## 2017-06-21 DIAGNOSIS — N189 Chronic kidney disease, unspecified: Secondary | ICD-10-CM | POA: Insufficient documentation

## 2017-06-21 DIAGNOSIS — E039 Hypothyroidism, unspecified: Secondary | ICD-10-CM | POA: Insufficient documentation

## 2017-06-21 LAB — COMPREHENSIVE METABOLIC PANEL
ALT: 17 U/L (ref 0–55)
ANION GAP: 10 meq/L (ref 3–11)
AST: 18 U/L (ref 5–34)
Albumin: 3.7 g/dL (ref 3.5–5.0)
Alkaline Phosphatase: 139 U/L (ref 40–150)
BILIRUBIN TOTAL: 0.41 mg/dL (ref 0.20–1.20)
BUN: 14 mg/dL (ref 7.0–26.0)
CALCIUM: 9.4 mg/dL (ref 8.4–10.4)
CHLORIDE: 101 meq/L (ref 98–109)
CO2: 25 mEq/L (ref 22–29)
CREATININE: 0.8 mg/dL (ref 0.7–1.3)
EGFR: >60 mL/min/{1.73_m2} (ref >60)
Glucose: 102 mg/dl (ref 70–140)
Potassium: 3.6 mEq/L (ref 3.5–5.1)
Sodium: 136 mEq/L (ref 136–145)
Total Protein: 7.4 g/dL (ref 6.4–8.3)

## 2017-06-21 LAB — CBC WITH DIFFERENTIAL/PLATELET
BASO%: 0.2 % (ref 0.0–2.0)
BASOS ABS: 0 10*3/uL (ref 0.0–0.1)
EOS ABS: 0.1 10*3/uL (ref 0.0–0.5)
EOS%: 1.6 % (ref 0.0–7.0)
HEMATOCRIT: 38.8 % (ref 38.4–49.9)
HGB: 13.3 g/dL (ref 13.0–17.1)
LYMPH#: 1.2 10*3/uL (ref 0.9–3.3)
LYMPH%: 20.5 % (ref 14.0–49.0)
MCH: 29.7 pg (ref 27.2–33.4)
MCHC: 34.3 g/dL (ref 32.0–36.0)
MCV: 86.6 fL (ref 79.3–98.0)
MONO#: 0.4 10*3/uL (ref 0.1–0.9)
MONO%: 7.6 % (ref 0.0–14.0)
NEUT#: 4 10*3/uL (ref 1.5–6.5)
NEUT%: 70.1 % (ref 39.0–75.0)
PLATELETS: 131 10*3/uL — AB (ref 140–400)
RBC: 4.48 10*6/uL (ref 4.20–5.82)
RDW: 15.3 % — ABNORMAL HIGH (ref 11.0–14.6)
WBC: 5.8 10*3/uL (ref 4.0–10.3)

## 2017-06-21 MED ORDER — DURVALUMAB 500 MG/10ML IV SOLN
10.0000 mg/kg | Freq: Once | INTRAVENOUS | Status: AC
  Administered 2017-06-21: 620 mg via INTRAVENOUS
  Filled 2017-06-21: qty 10

## 2017-06-21 MED ORDER — SODIUM CHLORIDE 0.9 % IV SOLN
Freq: Once | INTRAVENOUS | Status: AC
  Administered 2017-06-21: 15:00:00 via INTRAVENOUS

## 2017-06-21 NOTE — Patient Instructions (Signed)
[facility] Discharge Instructions for Patients Receiving Chemotherapy  Today you received the following chemotherapy agents:  Durvalumab (Imfinzi)  To help prevent nausea and vomiting after your treatment, we encourage you to take your nausea medication as prescribed.   If you develop nausea and vomiting that is not controlled by your nausea medication, call the clinic.   BELOW ARE SYMPTOMS THAT SHOULD BE REPORTED IMMEDIATELY:  *FEVER GREATER THAN 100.5 F  *CHILLS WITH OR WITHOUT FEVER  NAUSEA AND VOMITING THAT IS NOT CONTROLLED WITH YOUR NAUSEA MEDICATION  *UNUSUAL SHORTNESS OF BREATH  *UNUSUAL BRUISING OR BLEEDING  TENDERNESS IN MOUTH AND THROAT WITH OR WITHOUT PRESENCE OF ULCERS  *URINARY PROBLEMS  *BOWEL PROBLEMS  UNUSUAL RASH Items with * indicate a potential emergency and should be followed up as soon as possible.  Feel free to call the clinic should you have any questions or concerns. The clinic phone number is [phone].  Please show the CHEMO ALERT CARD at check-in to the Emergency Department and triage nurse.   

## 2017-06-21 NOTE — Progress Notes (Signed)
  [facility] OFFICE PROGRESS NOTE   DIAGNOSIS: Stage IIIA (T1b, N2, M0) non-small cell lung cancer, adenocarcinoma with positive PDL 1 expression 50% diagnosed in March 2018 and presented with right upper lobe lung nodule in addition to mediastinal lymphadenopathy.  PRIOR THERAPY: Course of concurrent chemoradiation with weekly carboplatin for AUC of 2 and paclitaxel 45 MG/M2. First dose of chemotherapy 10/17/2016. Status post 8 cycles. Last dose was given 12/05/2016 with partial response.  CURRENT THERAPY: Consolidation treatment with immunotherapy with Imfinzi (Durvalumab) 10 MG/KG every 2 weeks. First dose 01/18/2017. Status post11cycles.   INTERVAL HISTORY:   [name] [name] returns as scheduled.  He completed cycle 11 Imfinzi 06/07/2017.  He denies nausea/vomiting. No mouth sores.  No significant diarrhea.  He has an occasional loose stool.  No rash.  He denies shortness of breath.  He has an occasional cough.  At times he will expectorate phlegm.  No hemoptysis.  He reports a good appetite and weight gain.  Objective:  Vital signs in last 24 hours:  Blood pressure (!) 121/45, pulse 65, temperature 97.7 F (36.5 C), temperature source Oral, resp. rate 18, weight 134 lb (60.8 kg), SpO2 99 %.    HEENT: No thrush or ulcers. Resp: Lungs clear bilaterally. Cardio: Regular rate and rhythm. GI: Abdomen soft and nontender.  No hepatomegaly. Vascular: No leg edema. Neuro: Alert and oriented. Skin: No rash.   Lab Results:  Lab Results  Component Value Date   WBC 5.8 06/21/2017   HGB 13.3 06/21/2017   HCT 38.8 06/21/2017   MCV 86.6 06/21/2017   PLT 131 (L) 06/21/2017   NEUTROABS 4.0 06/21/2017    Imaging:  No results found.  Medications: I have reviewed the patient's current medications.  Assessment/Plan: 1. Stage IIIA (T1b, N2, M0) non-small cell lung cancer, adenocarcinoma with positive PDL 1 expression 50% diagnosed in March 2018 and presented with  right upper lobe lung nodule in addition to mediastinal lymphadenopathy.  Status post concurrent chemoradiation with weekly carboplatin/Taxol; now consolidation immunotherapy with Imfinzi status post 11 cycles.  Disposition: [name] [name] appears stable.  He has completed 11 cycles of Imfinzi.  Plan to proceed with cycle 12 today as scheduled.  He will have a restaging chest CT prior to his next visit in 2 weeks.  He will return for follow-up 07/05/2017.  He will contact the office in the interim with any problems.  Plan reviewed with Dr. Julien Nordmann.    Ned Card ANP/GNP-BC   06/21/2017  1:51 PM

## 2017-06-22 ENCOUNTER — Ambulatory Visit (INDEPENDENT_AMBULATORY_CARE_PROVIDER_SITE_OTHER): Payer: Medicare Other

## 2017-06-22 DIAGNOSIS — Z7901 Long term (current) use of anticoagulants: Secondary | ICD-10-CM | POA: Diagnosis not present

## 2017-06-22 LAB — POCT INR: INR: 1.8

## 2017-06-28 ENCOUNTER — Telehealth: Payer: Self-pay | Admitting: Emergency Medicine

## 2017-06-28 NOTE — Telephone Encounter (Signed)
Patients wife called to have CT scan scheduled at [facility]. Pt wife made aware that scan is scheduled for 1/11 @ 12pm. Pt wife verbalized understanding.

## 2017-06-30 ENCOUNTER — Ambulatory Visit (HOSPITAL_COMMUNITY)
Admission: RE | Admit: 2017-06-30 | Discharge: 2017-06-30 | Disposition: A | Discharge status: Home / Self Care | Payer: Medicare Other | Source: Ambulatory Visit | Attending: Nurse Practitioner | Admitting: Nurse Practitioner

## 2017-06-30 DIAGNOSIS — I700 Atherosclerosis of aorta: Secondary | ICD-10-CM | POA: Insufficient documentation

## 2017-06-30 DIAGNOSIS — C342 Malignant neoplasm of middle lobe, bronchus or lung: Secondary | ICD-10-CM | POA: Diagnosis not present

## 2017-06-30 DIAGNOSIS — I712 Thoracic aortic aneurysm, without rupture: Secondary | ICD-10-CM | POA: Insufficient documentation

## 2017-06-30 MED ORDER — IOPAMIDOL (ISOVUE-300) INJECTION 61%
75.0000 mL | Freq: Once | INTRAVENOUS | Status: AC | PRN
  Administered 2017-06-30: 75 mL via INTRAVENOUS

## 2017-07-03 ENCOUNTER — Ambulatory Visit (HOSPITAL_COMMUNITY): Payer: Medicare Other

## 2017-07-05 ENCOUNTER — Inpatient Hospital Stay (HOSPITAL_BASED_OUTPATIENT_CLINIC_OR_DEPARTMENT_OTHER): Payer: Medicare Other | Admitting: Oncology

## 2017-07-05 ENCOUNTER — Encounter: Payer: Self-pay | Admitting: Oncology

## 2017-07-05 ENCOUNTER — Telehealth: Payer: Self-pay | Admitting: Oncology

## 2017-07-05 ENCOUNTER — Inpatient Hospital Stay: Payer: Medicare Other

## 2017-07-05 VITALS — BP 125/49 | HR 69 | Temp 97.5°F | Resp 19 | Ht 67.0 in | Wt 132.4 lb

## 2017-07-05 DIAGNOSIS — C342 Malignant neoplasm of middle lobe, bronchus or lung: Secondary | ICD-10-CM

## 2017-07-05 DIAGNOSIS — E039 Hypothyroidism, unspecified: Secondary | ICD-10-CM

## 2017-07-05 DIAGNOSIS — Z5112 Encounter for antineoplastic immunotherapy: Secondary | ICD-10-CM

## 2017-07-05 DIAGNOSIS — R1310 Dysphagia, unspecified: Secondary | ICD-10-CM | POA: Diagnosis not present

## 2017-07-05 DIAGNOSIS — N189 Chronic kidney disease, unspecified: Secondary | ICD-10-CM

## 2017-07-05 DIAGNOSIS — I129 Hypertensive chronic kidney disease with stage 1 through stage 4 chronic kidney disease, or unspecified chronic kidney disease: Secondary | ICD-10-CM | POA: Diagnosis not present

## 2017-07-05 DIAGNOSIS — C3411 Malignant neoplasm of upper lobe, right bronchus or lung: Secondary | ICD-10-CM

## 2017-07-05 LAB — CBC WITH DIFFERENTIAL/PLATELET
Basophils Absolute: 0 10*3/uL (ref 0.0–0.1)
Basophils Relative: 1 %
Eosinophils Absolute: 0.1 10*3/uL (ref 0.0–0.5)
Eosinophils Relative: 2 %
HCT: 37 % — ABNORMAL LOW (ref 38.4–49.9)
HEMOGLOBIN: 12.9 g/dL — AB (ref 13.0–17.1)
LYMPHS PCT: 17 %
Lymphs Abs: 0.8 10*3/uL — ABNORMAL LOW (ref 0.9–3.3)
MCH: 29.6 pg (ref 27.2–33.4)
MCHC: 34.8 g/dL (ref 32.0–36.0)
MCV: 85 fL (ref 79.3–98.0)
Monocytes Absolute: 0.2 10*3/uL (ref 0.1–0.9)
Monocytes Relative: 4 %
NEUTROS ABS: 3.8 10*3/uL (ref 1.5–6.5)
Neutrophils Relative %: 76 %
Platelets: 191 10*3/uL (ref 140–400)
RBC: 4.35 MIL/uL (ref 4.20–5.82)
RDW: 14.9 % (ref 11.0–15.6)
WBC: 4.9 10*3/uL (ref 4.0–10.3)

## 2017-07-05 LAB — COMPREHENSIVE METABOLIC PANEL
ALBUMIN: 3.6 g/dL (ref 3.5–5.0)
ALK PHOS: 136 U/L (ref 40–150)
ALT: 16 U/L (ref 0–55)
ANION GAP: 8 (ref 3–11)
AST: 22 U/L (ref 5–34)
BILIRUBIN TOTAL: 0.4 mg/dL (ref 0.2–1.2)
BUN: 19 mg/dL (ref 7–26)
CALCIUM: 9.5 mg/dL (ref 8.4–10.4)
CO2: 25 mmol/L (ref 22–29)
Chloride: 105 mmol/L (ref 98–109)
Creatinine, Ser: 0.8 mg/dL (ref 0.70–1.30)
GFR calc Af Amer: >60 mL/min (ref >60)
GFR calc non Af Amer: >60 mL/min (ref >60)
GLUCOSE: 94 mg/dL (ref 70–140)
POTASSIUM: 4.1 mmol/L (ref 3.5–5.1)
Sodium: 138 mmol/L (ref 136–145)
TOTAL PROTEIN: 6.9 g/dL (ref 6.4–8.3)

## 2017-07-05 LAB — TSH: TSH: 0.442 u[IU]/mL (ref 0.320–4.118)

## 2017-07-05 MED ORDER — SODIUM CHLORIDE 0.9 % IV SOLN
Freq: Once | INTRAVENOUS | Status: AC
  Administered 2017-07-05: 11:00:00 via INTRAVENOUS

## 2017-07-05 MED ORDER — SODIUM CHLORIDE 0.9 % IV SOLN
10.0000 mg/kg | Freq: Once | INTRAVENOUS | Status: AC
  Administered 2017-07-05: 620 mg via INTRAVENOUS
  Filled 2017-07-05: qty 2.4

## 2017-07-05 NOTE — Patient Instructions (Signed)
[facility] Discharge Instructions for Patients Receiving Chemotherapy  Today you received the following chemotherapy agents Imfinzi. To help prevent nausea and vomiting after your treatment, we encourage you to take your nausea medication as prescribed.  If you develop nausea and vomiting that is not controlled by your nausea medication, call the clinic.   BELOW ARE SYMPTOMS THAT SHOULD BE REPORTED IMMEDIATELY:  *FEVER GREATER THAN 100.5 F  *CHILLS WITH OR WITHOUT FEVER  NAUSEA AND VOMITING THAT IS NOT CONTROLLED WITH YOUR NAUSEA MEDICATION  *UNUSUAL SHORTNESS OF BREATH  *UNUSUAL BRUISING OR BLEEDING  TENDERNESS IN MOUTH AND THROAT WITH OR WITHOUT PRESENCE OF ULCERS  *URINARY PROBLEMS  *BOWEL PROBLEMS  UNUSUAL RASH Items with * indicate a potential emergency and should be followed up as soon as possible.  Feel free to call the clinic should you have any questions or concerns. The clinic phone number is [phone].  Please show the [facility] at check-in to the Emergency Department and triage nurse.

## 2017-07-05 NOTE — Telephone Encounter (Signed)
Scheduled appt per 1/16 los - Patient to get an updated schedule from the treatment area - pt aware appts added.

## 2017-07-05 NOTE — Assessment & Plan Note (Signed)
This is a very pleasant 77 year old white male with a stage IIIa non-small cell lung cancer, adenocarcinoma with positive PDL 1 expression of 50%. The patient completed a course of concurrent chemoradiation with weekly carboplatin and paclitaxel status post 8 weeks and tolerated his treatment well except for mild dysphagia and odynophagia. He is currently undergoing consolidation immunotherapy with Imfinzi (Durvalumab) status post12cycles. He tolerated the last cycle of his treatment fairly well with no significant adverse effects.  The patient was seen with Dr. Julien Nordmann.  CT scan results were discussed with the patient and his wife which showed stable to slightly improved disease.  Recommend he continue his current treatment with Imfinzi. He will proceed with cycle 13 today as scheduled.  We will see him back for follow-up visit in 2 weeks for evaluation before the next dose of his treatment.  The patient was advised to call immediately if he has any concerning symptoms in the interval. The patient voices understanding of current disease status and treatment options and is in agreement with the current care plan. All questions were answered. The patient knows to call the clinic with any problems, questions or concerns. We can certainly see the patient much sooner if necessary.

## 2017-07-05 NOTE — Progress Notes (Signed)
[place] [facility] OFFICE PROGRESS NOTE  [name], MD [address] [place] [address]  DIAGNOSIS: Stage IIIA (T1b, N2, M0) non-small cell lung cancer, adenocarcinoma with positive PDL 1 expression 50% diagnosed in March 2018 and presented with right upper lobe lung nodule in addition to mediastinal lymphadenopathy.  PRIOR THERAPY: Course of concurrent chemoradiation with weekly carboplatin for AUC of 2 and paclitaxel 45 MG/M2. First dose of chemotherapy 10/17/2016. Status post 8 cycles. Last dose was given 12/05/2016 with partial response.  CURRENT THERAPY: Consolidation treatment with immunotherapy with Imfinzi (Durvalumab) 10 MG/KG every 2 weeks. First dose 01/18/2017. Status post12cycles.  INTERVAL HISTORY: [name] [name] 76 y.o. male returns for routine follow-up visit accompanied by his wife.  The patient is feeling fine today and has no specific complaints.  He continues to tolerate treatment with Imfinzi fairly well.  He denies fevers and chills.  Denies chest pain, shortness breath, cough, hemoptysis.  Denies nausea, vomiting, constipation, diarrhea.  The patient is here for evaluation prior to cycle #13 of his treatment and to review his restaging CT scan of the chest.  MEDICAL HISTORY: Past Medical History:  Diagnosis Date  . Adenocarcinoma of right lung, stage 3 ([facility]) 09/20/2016  . Atrial fibrillation ([facility])    no hx of reported at preop visit of 04/21/11   . CAD (coronary artery disease)   . Carotid artery occlusion    left carotid endarterectomy  . Chronic kidney disease    AAA repair with reimplant of renals   . CHRONIC OBSTRUCTIVE PULMONARY DISEASE    pt denied at visit of 04/21/11   . CORONARY ARTERY DISEASE   . Dehydration 11/15/2016  . Disorder of left sacroiliac joint 08/24/2016  . DIVERTICULOSIS OF COLON   . Encounter for antineoplastic chemotherapy 10/17/2016  . GERD   . H/O hiatal hernia   . Headache(784.0)    Hx: of years of years   . HYPERTENSION   . HYPOTHYROIDISM   . JOINT EFFUSION, KNEE   . KNEE, ARTHRITIS, DEGEN./OSTEO    right knee   . Myocardial infarction ([facility])    1994   . NEPHROLITHIASIS, HX OF   . OSTEOPOROSIS   . Other dysphagia   . PERIPHERAL VASCULAR DISEASE    AAA - 1994 with reimplant of renals   . Peripheral vascular disease ([facility])    subclavian stenosis PTA - 3/08   . Pure hypercholesterolemia   . Renal artery stenosis ([facility])     ALLERGIES:  is allergic to neurontin [gabapentin]; imitrex [sumatriptan]; and lipitor [atorvastatin].  MEDICATIONS:  Current Outpatient Medications  Medication Sig Dispense Refill  . bumetanide (BUMEX) 1 MG tablet Take 1 tablet (1 mg total) by mouth daily as needed (swelling). 30 tablet 1  . donepezil (ARICEPT) 5 MG tablet Take 1 tablet (5 mg total) by mouth at bedtime. 90 tablet 1  . lansoprazole (PREVACID) 15 MG capsule Take 1 capsule (15 mg total) by mouth daily. 30 capsule 5  . levothyroxine (SYNTHROID, LEVOTHROID) 112 MCG tablet Take 1 tablet (112 mcg total) by mouth daily. 90 tablet 3  . magic mouthwash w/lidocaine SOLN Composition: 80 cc each of Extra St. Maalox Plus, Diphenhydramine, and Nystatin. Plus 240 cc of 2% viscous lidocaine to make 480 cc. Swish and swallow 2 tsp four times per day. 480 mL 2  . metoprolol tartrate (LOPRESSOR) 25 MG tablet TAKE ONE-HALF TABLET BY MOUTH TWO TIMES DAILY 30 tablet 5  . niacin (SLO-NIACIN) 500 MG tablet  Take 3 tablets (1,500 mg total) by mouth at bedtime. 30 tablet 0  . potassium chloride SA (K-DUR,KLOR-CON) 20 MEQ tablet Take 1 tablet (20 mEq total) by mouth daily. 90 tablet 0  . rosuvastatin (CRESTOR) 20 MG tablet Take 1 tablet (20 mg total) by mouth daily. 30 tablet 11  . sucralfate (CARAFATE) 1 GM/10ML suspension Take 10 mLs (1 g total) by mouth 4 (four) times daily -  with meals and at bedtime. 420 mL 1  . tamsulosin (FLOMAX) 0.4 MG CAPS capsule Take 1 capsule (0.4 mg total) by mouth daily after breakfast. 30 capsule 1   . warfarin (COUMADIN) 5 MG tablet Take one tablet daily. take as directed by physician. 90 tablet 1  . acetaminophen (TYLENOL) 325 MG tablet Take 325-650 mg by mouth every 6 (six) hours as needed for headache (for pain).     [name] ALPRAZolam (XANAX) 0.25 MG tablet Take 1 tablet (0.25 mg total) by mouth 3 (three) times daily as needed for anxiety. (Patient not taking: Reported on 07/05/2017) 30 tablet 0  . linaclotide (LINZESS) 145 MCG CAPS capsule Take 1 capsule (145 mcg total) by mouth daily before breakfast. (Patient not taking: Reported on 07/05/2017) 30 capsule 11  . nitroGLYCERIN (NITROSTAT) 0.4 MG SL tablet Place 1 tablet (0.4 mg total) under the tongue every 5 (five) minutes as needed for chest pain. (Patient not taking: Reported on 07/05/2017) 25 tablet 10  . ondansetron (ZOFRAN) 8 MG tablet Take 1 tablet (8 mg total) by mouth every 8 (eight) hours as needed for nausea or vomiting. (Patient not taking: Reported on 05/10/2017) 20 tablet 0  . prochlorperazine (COMPAZINE) 10 MG tablet Take 1 tablet (10 mg total) by mouth every 6 (six) hours as needed for nausea or vomiting. (Patient not taking: Reported on 05/10/2017) 30 tablet 0   No current facility-administered medications for this visit.     SURGICAL HISTORY:  Past Surgical History:  Procedure Laterality Date  . ABDOMINAL AORTIC ANEURYSM REPAIR     with reimplantation of renals   . CARDIAC CATHETERIZATION     2008  . CAROTID ENDARTERECTOMY Left Jan. 30, 2015   CEA  . CHOLECYSTECTOMY  2000   Gall Bladder  . COLONOSCOPY W/ BIOPSIES AND POLYPECTOMY     Hx: of  . CORONARY ARTERY BYPASS GRAFT     1995  . CORONARY STENT PLACEMENT     Hx: of  . ENDARTERECTOMY Left 07/19/2013   Procedure: ENDARTERECTOMY CAROTID;  Surgeon: Mal Misty, MD;  Location: [facility];  Service: Vascular;  Laterality: Left;  . GALLBLADDER SURGERY  2004   . JOINT REPLACEMENT     partial knee replacement on left 2002   . KNEE SURGERY    . ORIF PELVIC FRACTURE Left  08/25/2016   Procedure: OPEN REDUCTION INTERNAL FIXATION (ORIF) PELVIC FRACTURE; plate on front, SI screw on the back;  Surgeon: Altamese Christmas, MD;  Location: [facility];  Service: Orthopedics;  Laterality: Left;  . OTHER SURGICAL HISTORY     left subclavian stenosis surgery PTA 08/2006   . OTHER SURGICAL HISTORY     carotid surgery on right 2004   . TOTAL KNEE ARTHROPLASTY  04/29/2011   Procedure: TOTAL KNEE ARTHROPLASTY;  Surgeon: Gearlean Alf;  Location: [facility];  Service: Orthopedics;  Laterality: Right;  . urinary retention    . VASCULAR SURGERY     AAA  . VASECTOMY  1973  . VIDEO BRONCHOSCOPY WITH ENDOBRONCHIAL ULTRASOUND [name] 09/09/2016   Procedure:  VIDEO BRONCHOSCOPY WITH ENDOBRONCHIAL ULTRASOUND;  Surgeon: Grace Isaac, MD;  Location: [facility];  Service: Thoracic;  Laterality: [name];    REVIEW OF SYSTEMS:   Review of Systems  Constitutional: Negative for appetite change, chills, fatigue, fever and unexpected weight change.  HENT:   Negative for mouth sores, nosebleeds, sore throat and trouble swallowing.   Eyes: Negative for eye problems and icterus.  Respiratory: Negative for cough, hemoptysis, shortness of breath and wheezing.   Cardiovascular: Negative for chest pain and leg swelling.  Gastrointestinal: Negative for abdominal pain, constipation, diarrhea, nausea and vomiting.  Genitourinary: Negative for bladder incontinence, difficulty urinating, dysuria, frequency and hematuria.   Musculoskeletal: Negative for back pain, gait problem, neck pain and neck stiffness.  Skin: Negative for itching and rash.  Neurological: Negative for dizziness, extremity weakness, gait problem, headaches, light-headedness and seizures.  Hematological: Negative for adenopathy. Does not bruise/bleed easily.  Psychiatric/Behavioral: Negative for confusion, depression and sleep disturbance. The patient is not nervous/anxious.     PHYSICAL EXAMINATION:  Blood pressure (!) 125/49, pulse 69, temperature (!)  97.5 F (36.4 C), temperature source Oral, resp. rate 19, height 5\' 7"  (1.702 m), weight 132 lb 6.4 oz (60.1 kg), SpO2 100 %.  ECOG PERFORMANCE STATUS: 1 - Symptomatic but completely ambulatory  Physical Exam  Constitutional: Oriented to person, place, and time and well-developed, well-nourished, and in no distress. No distress.  HENT:  Head: Normocephalic and atraumatic.  Mouth/Throat: Oropharynx is clear and moist. No oropharyngeal exudate.  Eyes: Conjunctivae are normal. Right eye exhibits no discharge. Left eye exhibits no discharge. No scleral icterus.  Neck: Normal range of motion. Neck supple.  Cardiovascular: Normal rate, regular rhythm, normal heart sounds and intact distal pulses.   Pulmonary/Chest: Effort normal and breath sounds normal. No respiratory distress. No wheezes. No rales.  Abdominal: Soft. Bowel sounds are normal. Exhibits no distension and no mass. There is no tenderness.  Musculoskeletal: Normal range of motion. Exhibits no edema.  Lymphadenopathy:    No cervical adenopathy.  Neurological: Alert and oriented to person, place, and time. Exhibits normal muscle tone. Gait normal. Coordination normal.  Skin: Skin is warm and dry. No rash noted. Not diaphoretic. No erythema. No pallor.  Psychiatric: Mood, memory and judgment normal.  Vitals reviewed.  LABORATORY DATA: Lab Results  Component Value Date   WBC 4.9 07/05/2017   HGB 12.9 (L) 07/05/2017   HCT 37.0 (L) 07/05/2017   MCV 85.0 07/05/2017   PLT 191 07/05/2017      Chemistry      Component Value Date/Time   NA 138 07/05/2017 0913   NA 136 06/21/2017 1246   K 4.1 07/05/2017 0913   K 3.6 06/21/2017 1246   CL 105 07/05/2017 0913   CO2 25 07/05/2017 0913   CO2 25 06/21/2017 1246   BUN 19 07/05/2017 0913   BUN 14.0 06/21/2017 1246   CREATININE 0.80 07/05/2017 0913   CREATININE 0.8 06/21/2017 1246      Component Value Date/Time   CALCIUM 9.5 07/05/2017 0913   CALCIUM 9.4 06/21/2017 1246   ALKPHOS  136 07/05/2017 0913   ALKPHOS 139 06/21/2017 1246   AST 22 07/05/2017 0913   AST 18 06/21/2017 1246   ALT 16 07/05/2017 0913   ALT 17 06/21/2017 1246   BILITOT 0.4 07/05/2017 0913   BILITOT 0.41 06/21/2017 1246       RADIOGRAPHIC STUDIES:  Ct Chest W Contrast  Result Date: 06/30/2017 CLINICAL DATA:  Right middle lobe lung cancer  on immunotherapy. Chemo radiation therapy completed. EXAM: CT CHEST WITH CONTRAST TECHNIQUE: Multidetector CT imaging of the chest was performed during intravenous contrast administration. CONTRAST:  75mL ISOVUE-300 IOPAMIDOL (ISOVUE-300) INJECTION 61% COMPARISON:  CT 04/10/2017 and 12/30/2016. FINDINGS: Cardiovascular: Severe atherosclerosis of the aorta, great vessels and coronary arteries status post median sternotomy and CABG. There is a stent in the proximal left subclavian artery. Approximately 10 mm penetrating ulcer in the descending thoracic aorta (axial image 107) is similar to the previous study. No evidence of mediastinal hematoma or acute vascular abnormality. Stable mild cardiomegaly. No significant pericardial effusion. Mediastinum/Nodes: There are no enlarged mediastinal, hilar or axillary lymph nodes. The thyroid gland appears atrophied or surgically absent. The trachea and esophagus appear unremarkable. Lungs/Pleura: No pleural effusion or pneumothorax. Similar bandlike density, bronchiectasis and distortion in the right perihilar region attributed to external beam radiation. No discrete right middle lobe mass apparent. There is a stable 5 mm subpleural right upper lobe nodule on image 33. Sub solid nodule posteriorly in the left upper lobe is less conspicuous (image 40). No new or enlarging pulmonary nodules. Underlying emphysema present. Upper abdomen: No acute findings are seen within the visualized upper abdomen. There is ostial stenosis of the celiac trunk and a duodenal diverticulum. Musculoskeletal/Chest wall: There is no chest wall mass or suspicious  osseous finding. T6 and T8 compression deformities are stable. IMPRESSION: 1. Stable radiation changes in the right perihilar region. No local tumor recurrence identified. 2. Stable to slightly improved mild upper lobe nodularity bilaterally. No adenopathy or evidence of metastatic disease. 3. Extensive Aortic Atherosclerosis (ICD10-I70.0). Stable penetrating ulcer in the distal thoracic aorta. 4. Stable T6 and T8 compression deformities. Electronically Signed   By: Richardean Sale M.D.   On: 06/30/2017 15:45     ASSESSMENT/PLAN:  T1b N2 M0 disease (stage IIIA).adenocarcinoma of the right middle lobe of lung [facility]) This is a very pleasant 77 year old white male with a stage IIIa non-small cell lung cancer, adenocarcinoma with positive PDL 1 expression of 50%. The patient completed a course of concurrent chemoradiation with weekly carboplatin and paclitaxel status post 8 weeks and tolerated his treatment well except for mild dysphagia and odynophagia. He is currently undergoing consolidation immunotherapy with Imfinzi (Durvalumab) status post12cycles. He tolerated the last cycle of his treatment fairly well with no significant adverse effects.  The patient was seen with Dr. Julien Nordmann.  CT scan results were discussed with the patient and his wife which showed stable to slightly improved disease.  Recommend he continue his current treatment with Imfinzi. He will proceed with cycle 13 today as scheduled.  We will see him back for follow-up visit in 2 weeks for evaluation before the next dose of his treatment.  The patient was advised to call immediately if he has any concerning symptoms in the interval. The patient voices understanding of current disease status and treatment options and is in agreement with the current care plan. All questions were answered. The patient knows to call the clinic with any problems, questions or concerns. We can certainly see the patient much sooner if necessary.  No  orders of the defined types were placed in this encounter.   Mikey Bussing, DNP, AGPCNP-BC, AOCNP 07/05/17  ADDENDUM: Hematology/Oncology Attending: I had a face-to-face encounter with the patient today.  I recommended his care plan.  This is a very pleasant 78 years old white male with a stage IIIa non-small cell lung cancer, adenocarcinoma with positive PDL 1 expression of 50%.  He is status post concurrent chemoradiation with weekly carboplatin and paclitaxel and currently undergoing consolidation immunotherapy with Imfinzi (Durvalumab) status post 12 cycles.  He has been tolerating this treatment fairly well with no concerning complaints. He had repeat CT scan of the chest performed recently.  I personally and independently reviewed the scan and discussed the results with the patient and his wife. Has a scan showed no evidence for disease progression with a slight improvement of his disease. I recommended for the patient to continue his current consolidation treatment with Imfinzi (Durvalumab) and the patient will proceed with cycle #13 today. I will see him back for follow-up visit in 2 weeks for evaluation before the next cycle of his treatment. The patient was advised to call immediately if he has any concerning symptoms in the interval.  Disclaimer: This note was dictated with voice recognition software. Similar sounding words can inadvertently be transcribed and may be missed upon review. Eilleen Kempf, MD 07/05/17

## 2017-07-13 ENCOUNTER — Telehealth: Payer: Self-pay | Admitting: Family Medicine

## 2017-07-13 NOTE — Telephone Encounter (Signed)
Patient is requesting prescription for catheter it can be verbal authorization at a new company called [employer] his specialist told him he would need to get prescription from primary care from here out.Phone#[phone]

## 2017-07-13 NOTE — Telephone Encounter (Signed)
Lets do, will need to get from pt frequency and how many he usually gets at a time

## 2017-07-14 NOTE — Telephone Encounter (Addendum)
Verbal order given for catheter supplies. Patient uses catheters 4 times a day

## 2017-07-19 ENCOUNTER — Encounter: Payer: Self-pay | Admitting: Oncology

## 2017-07-19 ENCOUNTER — Inpatient Hospital Stay (HOSPITAL_BASED_OUTPATIENT_CLINIC_OR_DEPARTMENT_OTHER): Payer: Medicare Other | Admitting: Oncology

## 2017-07-19 ENCOUNTER — Inpatient Hospital Stay: Payer: Medicare Other

## 2017-07-19 ENCOUNTER — Telehealth: Payer: Self-pay | Admitting: Internal Medicine

## 2017-07-19 VITALS — BP 133/58 | HR 65 | Temp 97.9°F | Resp 18 | Ht 67.0 in | Wt 132.4 lb

## 2017-07-19 DIAGNOSIS — C342 Malignant neoplasm of middle lobe, bronchus or lung: Secondary | ICD-10-CM

## 2017-07-19 DIAGNOSIS — Z5112 Encounter for antineoplastic immunotherapy: Secondary | ICD-10-CM

## 2017-07-19 DIAGNOSIS — I129 Hypertensive chronic kidney disease with stage 1 through stage 4 chronic kidney disease, or unspecified chronic kidney disease: Secondary | ICD-10-CM | POA: Diagnosis not present

## 2017-07-19 DIAGNOSIS — N189 Chronic kidney disease, unspecified: Secondary | ICD-10-CM | POA: Diagnosis not present

## 2017-07-19 DIAGNOSIS — C3411 Malignant neoplasm of upper lobe, right bronchus or lung: Secondary | ICD-10-CM | POA: Diagnosis not present

## 2017-07-19 DIAGNOSIS — E039 Hypothyroidism, unspecified: Secondary | ICD-10-CM | POA: Diagnosis not present

## 2017-07-19 LAB — COMPREHENSIVE METABOLIC PANEL
ALT: 17 U/L (ref 0–55)
AST: 21 U/L (ref 5–34)
Albumin: 3.5 g/dL (ref 3.5–5.0)
Alkaline Phosphatase: 130 U/L (ref 40–150)
Anion gap: 8 (ref 3–11)
BUN: 17 mg/dL (ref 7–26)
CHLORIDE: 105 mmol/L (ref 98–109)
CO2: 23 mmol/L (ref 22–29)
Calcium: 9.5 mg/dL (ref 8.4–10.4)
Creatinine, Ser: 0.78 mg/dL (ref 0.70–1.30)
Glucose, Bld: 94 mg/dL (ref 70–140)
POTASSIUM: 4.5 mmol/L (ref 3.5–5.1)
SODIUM: 136 mmol/L (ref 136–145)
Total Bilirubin: 0.4 mg/dL (ref 0.2–1.2)
Total Protein: 6.9 g/dL (ref 6.4–8.3)

## 2017-07-19 LAB — CBC WITH DIFFERENTIAL/PLATELET
BASOS ABS: 0 10*3/uL (ref 0.0–0.1)
Basophils Relative: 0 %
EOS PCT: 2 %
Eosinophils Absolute: 0.1 10*3/uL (ref 0.0–0.5)
HCT: 37.7 % — ABNORMAL LOW (ref 38.4–49.9)
Hemoglobin: 12.9 g/dL — ABNORMAL LOW (ref 13.0–17.1)
LYMPHS PCT: 19 %
Lymphs Abs: 1 10*3/uL (ref 0.9–3.3)
MCH: 29.6 pg (ref 27.2–33.4)
MCHC: 34.2 g/dL (ref 32.0–36.0)
MCV: 86.5 fL (ref 79.3–98.0)
MONO ABS: 0.3 10*3/uL (ref 0.1–0.9)
Monocytes Relative: 6 %
Neutro Abs: 3.7 10*3/uL (ref 1.5–6.5)
Neutrophils Relative %: 73 %
Platelets: 135 10*3/uL — ABNORMAL LOW (ref 140–400)
RBC: 4.36 MIL/uL (ref 4.20–5.82)
RDW: 15.1 % — AB (ref 11.0–14.6)
WBC: 5.1 10*3/uL (ref 4.0–10.3)

## 2017-07-19 MED ORDER — SODIUM CHLORIDE 0.9 % IV SOLN
Freq: Once | INTRAVENOUS | Status: AC
  Administered 2017-07-19: 14:00:00 via INTRAVENOUS

## 2017-07-19 MED ORDER — SODIUM CHLORIDE 0.9 % IV SOLN
10.0000 mg/kg | Freq: Once | INTRAVENOUS | Status: AC
  Administered 2017-07-19: 620 mg via INTRAVENOUS
  Filled 2017-07-19: qty 10

## 2017-07-19 NOTE — Assessment & Plan Note (Signed)
This is a very pleasant 77 year old white male with a stage IIIa non-small cell lung cancer, adenocarcinoma with positive PDL 1 expression of 50%. The patient completed a course of concurrent chemoradiation with weekly carboplatin and paclitaxel status post 8 weeks and tolerated his treatment well except for mild dysphagia and odynophagia. He is currently undergoing consolidation immunotherapy with Imfinzi (Durvalumab) status post13cycles. He tolerated the last cycle of his treatment fairly well with no significant adverse effects. Recommend that he proceed with cycle 14 of Imfinzi today as scheduled.  We will see him back for follow-up visit in 2 weeks for evaluation before the next dose of his treatment.  The patient was advised to call immediately if he has any concerning symptoms in the interval. The patient voices understanding of current disease status and treatment options and is in agreement with the current care plan. All questions were answered. The patient knows to call the clinic with any problems, questions or concerns. We can certainly see the patient much sooner if necessary.

## 2017-07-19 NOTE — Patient Instructions (Signed)
[facility] Discharge Instructions for Patients Receiving Chemotherapy  Today you received the following chemotherapy agents :  Durvalumab.  To help prevent nausea and vomiting after your treatment, we encourage you to take your nausea medication as prescribed.   If you develop nausea and vomiting that is not controlled by your nausea medication, call the clinic.   BELOW ARE SYMPTOMS THAT SHOULD BE REPORTED IMMEDIATELY:  *FEVER GREATER THAN 100.5 F  *CHILLS WITH OR WITHOUT FEVER  NAUSEA AND VOMITING THAT IS NOT CONTROLLED WITH YOUR NAUSEA MEDICATION  *UNUSUAL SHORTNESS OF BREATH  *UNUSUAL BRUISING OR BLEEDING  TENDERNESS IN MOUTH AND THROAT WITH OR WITHOUT PRESENCE OF ULCERS  *URINARY PROBLEMS  *BOWEL PROBLEMS  UNUSUAL RASH Items with * indicate a potential emergency and should be followed up as soon as possible.  Feel free to call the clinic should you have any questions or concerns. The clinic phone number is [phone].  Please show the [facility] at check-in to the Emergency Department and triage nurse.

## 2017-07-19 NOTE — Progress Notes (Signed)
[facility] OFFICE PROGRESS NOTE  [name], MD [address] [place] [address]  DIAGNOSIS: Stage IIIA (T1b, N2, M0) non-small cell lung cancer, adenocarcinoma with positive PDL 1 expression 50% diagnosed in March 2018 and presented with right upper lobe lung nodule in addition to mediastinal lymphadenopathy.  PRIOR THERAPY: Course of concurrent chemoradiation with weekly carboplatin for AUC of 2 and paclitaxel 45 MG/M2. First dose of chemotherapy 10/17/2016. Status post 8 cycles. Last dose was given 12/05/2016 with partial response.  CURRENT THERAPY: Consolidation treatment with immunotherapy with Imfinzi (Durvalumab) 10 MG/KG every 2 weeks. First dose 01/18/2017. Status post13cycles.  INTERVAL HISTORY: [name] [name] 77 y.o. male returns for routine follow-up visit accompanied by his wife. The patient is feeling fine today and has no specific complaints.  He continues to tolerate treatment with Imfinzi fairly well.  He denies fevers and chills.  Denies chest pain, shortness breath, cough, hemoptysis.  Denies nausea, vomiting, constipation, diarrhea.  The patient is here for evaluation prior to cycle #14 of his treatment.  MEDICAL HISTORY: Past Medical History:  Diagnosis Date  . Adenocarcinoma of right lung, stage 3 ([facility]) 09/20/2016  . Atrial fibrillation ([facility])    no hx of reported at preop visit of 04/21/11   . CAD (coronary artery disease)   . Carotid artery occlusion    left carotid endarterectomy  . Chronic kidney disease    AAA repair with reimplant of renals   . CHRONIC OBSTRUCTIVE PULMONARY DISEASE    pt denied at visit of 04/21/11   . CORONARY ARTERY DISEASE   . Dehydration 11/15/2016  . Disorder of left sacroiliac joint 08/24/2016  . DIVERTICULOSIS OF COLON   . Encounter for antineoplastic chemotherapy 10/17/2016  . GERD   . H/O hiatal hernia   . Headache(784.0)    Hx: of years of years  . HYPERTENSION   . HYPOTHYROIDISM   . JOINT  EFFUSION, KNEE   . KNEE, ARTHRITIS, DEGEN./OSTEO    right knee   . Myocardial infarction ([facility])    1994   . NEPHROLITHIASIS, HX OF   . OSTEOPOROSIS   . Other dysphagia   . PERIPHERAL VASCULAR DISEASE    AAA - 1994 with reimplant of renals   . Peripheral vascular disease ([facility])    subclavian stenosis PTA - 3/08   . Pure hypercholesterolemia   . Renal artery stenosis ([facility])     ALLERGIES:  is allergic to neurontin [gabapentin]; imitrex [sumatriptan]; and lipitor [atorvastatin].  MEDICATIONS:  Current Outpatient Medications  Medication Sig Dispense Refill  . acetaminophen (TYLENOL) 325 MG tablet Take 325-650 mg by mouth every 6 (six) hours as needed for headache (for pain).     . bumetanide (BUMEX) 1 MG tablet Take 1 tablet (1 mg total) by mouth daily as needed (swelling). 30 tablet 1  . donepezil (ARICEPT) 5 MG tablet Take 1 tablet (5 mg total) by mouth at bedtime. 90 tablet 1  . lansoprazole (PREVACID) 15 MG capsule Take 1 capsule (15 mg total) by mouth daily. 30 capsule 5  . levothyroxine (SYNTHROID, LEVOTHROID) 112 MCG tablet Take 1 tablet (112 mcg total) by mouth daily. 90 tablet 3  . magic mouthwash w/lidocaine SOLN Composition: 80 cc each of Extra St. Maalox Plus, Diphenhydramine, and Nystatin. Plus 240 cc of 2% viscous lidocaine to make 480 cc. Swish and swallow 2 tsp four times per day. 480 mL 2  . metoprolol tartrate (LOPRESSOR) 25 MG tablet TAKE ONE-HALF TABLET BY  MOUTH TWO TIMES DAILY 30 tablet 5  . niacin (SLO-NIACIN) 500 MG tablet Take 3 tablets (1,500 mg total) by mouth at bedtime. 30 tablet 0  . potassium chloride SA (K-DUR,KLOR-CON) 20 MEQ tablet Take 1 tablet (20 mEq total) by mouth daily. 90 tablet 0  . rosuvastatin (CRESTOR) 20 MG tablet Take 1 tablet (20 mg total) by mouth daily. 30 tablet 11  . sucralfate (CARAFATE) 1 GM/10ML suspension Take 10 mLs (1 g total) by mouth 4 (four) times daily -  with meals and at bedtime. 420 mL 1  . tamsulosin (FLOMAX) 0.4 MG CAPS  capsule Take 1 capsule (0.4 mg total) by mouth daily after breakfast. 30 capsule 1  . warfarin (COUMADIN) 5 MG tablet Take one tablet daily. take as directed by physician. 90 tablet 1  . ALPRAZolam (XANAX) 0.25 MG tablet Take 1 tablet (0.25 mg total) by mouth 3 (three) times daily as needed for anxiety. (Patient not taking: Reported on 07/05/2017) 30 tablet 0  . linaclotide (LINZESS) 145 MCG CAPS capsule Take 1 capsule (145 mcg total) by mouth daily before breakfast. (Patient not taking: Reported on 07/05/2017) 30 capsule 11  . nitroGLYCERIN (NITROSTAT) 0.4 MG SL tablet Place 1 tablet (0.4 mg total) under the tongue every 5 (five) minutes as needed for chest pain. (Patient not taking: Reported on 07/05/2017) 25 tablet 10  . ondansetron (ZOFRAN) 8 MG tablet Take 1 tablet (8 mg total) by mouth every 8 (eight) hours as needed for nausea or vomiting. (Patient not taking: Reported on 05/10/2017) 20 tablet 0  . prochlorperazine (COMPAZINE) 10 MG tablet Take 1 tablet (10 mg total) by mouth every 6 (six) hours as needed for nausea or vomiting. (Patient not taking: Reported on 05/10/2017) 30 tablet 0   No current facility-administered medications for this visit.     SURGICAL HISTORY:  Past Surgical History:  Procedure Laterality Date  . ABDOMINAL AORTIC ANEURYSM REPAIR     with reimplantation of renals   . CARDIAC CATHETERIZATION     2008  . CAROTID ENDARTERECTOMY Left Jan. 30, 2015   CEA  . CHOLECYSTECTOMY  2000   Gall Bladder  . COLONOSCOPY W/ BIOPSIES AND POLYPECTOMY     Hx: of  . CORONARY ARTERY BYPASS GRAFT     1995  . CORONARY STENT PLACEMENT     Hx: of  . ENDARTERECTOMY Left 07/19/2013   Procedure: ENDARTERECTOMY CAROTID;  Surgeon: Mal Misty, MD;  Location: [facility];  Service: Vascular;  Laterality: Left;  . GALLBLADDER SURGERY  2004   . JOINT REPLACEMENT     partial knee replacement on left 2002   . KNEE SURGERY    . ORIF PELVIC FRACTURE Left 08/25/2016   Procedure: OPEN REDUCTION  INTERNAL FIXATION (ORIF) PELVIC FRACTURE; plate on front, SI screw on the back;  Surgeon: Altamese [place], MD;  Location: [facility];  Service: Orthopedics;  Laterality: Left;  . OTHER SURGICAL HISTORY     left subclavian stenosis surgery PTA 08/2006   . OTHER SURGICAL HISTORY     carotid surgery on right 2004   . TOTAL KNEE ARTHROPLASTY  04/29/2011   Procedure: TOTAL KNEE ARTHROPLASTY;  Surgeon: Gearlean Alf;  Location: [facility];  Service: Orthopedics;  Laterality: Right;  . urinary retention    . VASCULAR SURGERY     AAA  . VASECTOMY  1973  . VIDEO BRONCHOSCOPY WITH ENDOBRONCHIAL ULTRASOUND [name] 09/09/2016   Procedure: VIDEO BRONCHOSCOPY WITH ENDOBRONCHIAL ULTRASOUND;  Surgeon: Grace Isaac, MD;  Location: [facility];  Service: Thoracic;  Laterality: [name];    REVIEW OF SYSTEMS:   Review of Systems  Constitutional: Negative for appetite change, chills, fatigue, fever and unexpected weight change.  HENT:   Negative for mouth sores, nosebleeds, sore throat and trouble swallowing.   Eyes: Negative for eye problems and icterus.  Respiratory: Negative for cough, hemoptysis, shortness of breath and wheezing.   Cardiovascular: Negative for chest pain and leg swelling.  Gastrointestinal: Negative for abdominal pain, constipation, diarrhea, nausea and vomiting.  Genitourinary: Negative for bladder incontinence, difficulty urinating, dysuria, frequency and hematuria.   Musculoskeletal: Negative for back pain, gait problem, neck pain and neck stiffness.  Skin: Negative for itching and rash.  Neurological: Negative for dizziness, extremity weakness, gait problem, headaches, light-headedness and seizures.  Hematological: Negative for adenopathy. Does not bruise/bleed easily.  Psychiatric/Behavioral: Negative for confusion, depression and sleep disturbance. The patient is not nervous/anxious.     PHYSICAL EXAMINATION:  Blood pressure (!) 133/58, pulse 65, temperature 97.9 F (36.6 C), temperature source  Oral, resp. rate 18, height 5\' 7"  (1.702 m), weight 132 lb 6.4 oz (60.1 kg), SpO2 100 %.  ECOG PERFORMANCE STATUS: 1 - Symptomatic but completely ambulatory  Physical Exam  Constitutional: Oriented to person, place, and time and well-developed, well-nourished, and in no distress. No distress.  HENT:  Head: Normocephalic and atraumatic.  Mouth/Throat: Oropharynx is clear and moist. No oropharyngeal exudate.  Eyes: Conjunctivae are normal. Right eye exhibits no discharge. Left eye exhibits no discharge. No scleral icterus.  Neck: Normal range of motion. Neck supple.  Cardiovascular: Normal rate, regular rhythm, normal heart sounds and intact distal pulses.   Pulmonary/Chest: Effort normal and breath sounds normal. No respiratory distress. No wheezes. No rales.  Abdominal: Soft. Bowel sounds are normal. Exhibits no distension and no mass. There is no tenderness.  Musculoskeletal: Normal range of motion. Exhibits no edema.  Lymphadenopathy:    No cervical adenopathy.  Neurological: Alert and oriented to person, place, and time. Exhibits normal muscle tone. Gait normal. Coordination normal.  Skin: Skin is warm and dry. No rash noted. Not diaphoretic. No erythema. No pallor.  Psychiatric: Mood, memory and judgment normal.  Vitals reviewed.  LABORATORY DATA: Lab Results  Component Value Date   WBC 5.1 07/19/2017   HGB 12.9 (L) 07/19/2017   HCT 37.7 (L) 07/19/2017   MCV 86.5 07/19/2017   PLT 135 (L) 07/19/2017      Chemistry      Component Value Date/Time   NA 136 07/19/2017 1223   NA 136 06/21/2017 1246   K 4.5 07/19/2017 1223   K 3.6 06/21/2017 1246   CL 105 07/19/2017 1223   CO2 23 07/19/2017 1223   CO2 25 06/21/2017 1246   BUN 17 07/19/2017 1223   BUN 14.0 06/21/2017 1246   CREATININE 0.78 07/19/2017 1223   CREATININE 0.8 06/21/2017 1246      Component Value Date/Time   CALCIUM 9.5 07/19/2017 1223   CALCIUM 9.4 06/21/2017 1246   ALKPHOS 130 07/19/2017 1223   ALKPHOS 139  06/21/2017 1246   AST 21 07/19/2017 1223   AST 18 06/21/2017 1246   ALT 17 07/19/2017 1223   ALT 17 06/21/2017 1246   BILITOT 0.4 07/19/2017 1223   BILITOT 0.41 06/21/2017 1246       RADIOGRAPHIC STUDIES:  Ct Chest W Contrast  Result Date: 06/30/2017 CLINICAL DATA:  Right middle lobe lung cancer on immunotherapy. Chemo radiation therapy completed. EXAM: CT CHEST WITH CONTRAST TECHNIQUE:  Multidetector CT imaging of the chest was performed during intravenous contrast administration. CONTRAST:  55mL ISOVUE-300 IOPAMIDOL (ISOVUE-300) INJECTION 61% COMPARISON:  CT 04/10/2017 and 12/30/2016. FINDINGS: Cardiovascular: Severe atherosclerosis of the aorta, great vessels and coronary arteries status post median sternotomy and CABG. There is a stent in the proximal left subclavian artery. Approximately 10 mm penetrating ulcer in the descending thoracic aorta (axial image 107) is similar to the previous study. No evidence of mediastinal hematoma or acute vascular abnormality. Stable mild cardiomegaly. No significant pericardial effusion. Mediastinum/Nodes: There are no enlarged mediastinal, hilar or axillary lymph nodes. The thyroid gland appears atrophied or surgically absent. The trachea and esophagus appear unremarkable. Lungs/Pleura: No pleural effusion or pneumothorax. Similar bandlike density, bronchiectasis and distortion in the right perihilar region attributed to external beam radiation. No discrete right middle lobe mass apparent. There is a stable 5 mm subpleural right upper lobe nodule on image 33. Sub solid nodule posteriorly in the left upper lobe is less conspicuous (image 40). No new or enlarging pulmonary nodules. Underlying emphysema present. Upper abdomen: No acute findings are seen within the visualized upper abdomen. There is ostial stenosis of the celiac trunk and a duodenal diverticulum. Musculoskeletal/Chest wall: There is no chest wall mass or suspicious osseous finding. T6 and T8  compression deformities are stable. IMPRESSION: 1. Stable radiation changes in the right perihilar region. No local tumor recurrence identified. 2. Stable to slightly improved mild upper lobe nodularity bilaterally. No adenopathy or evidence of metastatic disease. 3. Extensive Aortic Atherosclerosis (ICD10-I70.0). Stable penetrating ulcer in the distal thoracic aorta. 4. Stable T6 and T8 compression deformities. Electronically Signed   By: Richardean Sale M.D.   On: 06/30/2017 15:45     ASSESSMENT/PLAN:  T1b N2 M0 disease (stage IIIA).adenocarcinoma of the right middle lobe of lung [facility]) This is a very pleasant 77 year old white male with a stage IIIa non-small cell lung cancer, adenocarcinoma with positive PDL 1 expression of 50%. The patient completed a course of concurrent chemoradiation with weekly carboplatin and paclitaxel status post 8 weeks and tolerated his treatment well except for mild dysphagia and odynophagia. He is currently undergoing consolidation immunotherapy with Imfinzi (Durvalumab) status post13cycles. He tolerated the last cycle of his treatment fairly well with no significant adverse effects. Recommend that he proceed with cycle 14 of Imfinzi today as scheduled.  We will see him back for follow-up visit in 2 weeks for evaluation before the next dose of his treatment.  The patient was advised to call immediately if he has any concerning symptoms in the interval. The patient voices understanding of current disease status and treatment options and is in agreement with the current care plan. All questions were answered. The patient knows to call the clinic with any problems, questions or concerns. We can certainly see the patient much sooner if necessary.  No orders of the defined types were placed in this encounter.   Mikey Bussing, DNP, AGPCNP-BC, AOCNP 07/19/17

## 2017-07-19 NOTE — Telephone Encounter (Signed)
Next three cycles already scheduled per 1/30 los - no additional appts added.

## 2017-07-20 ENCOUNTER — Ambulatory Visit (INDEPENDENT_AMBULATORY_CARE_PROVIDER_SITE_OTHER): Payer: Medicare Other | Admitting: *Deleted

## 2017-07-20 DIAGNOSIS — Z7901 Long term (current) use of anticoagulants: Secondary | ICD-10-CM | POA: Diagnosis not present

## 2017-07-20 LAB — POCT INR: INR: 1.7

## 2017-07-29 IMAGING — US US RENAL
1 series · 14 of 25 positions shown · non-contrast
Comparison: PET-CT 09/29/2016.

CLINICAL DATA: UTI.

EXAM:
RENAL / URINARY TRACT ULTRASOUND COMPLETE

[Series 1: us renal · 0.23mm/px · 14 of 44 slices shown]
[im 1/44]
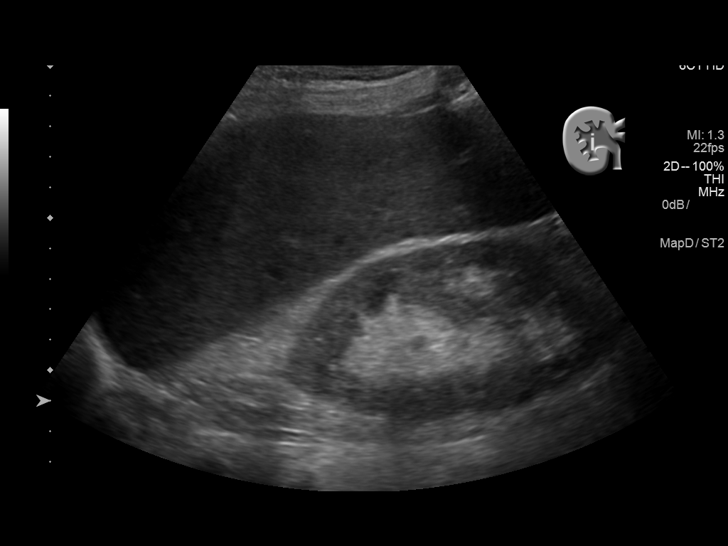
[im 4/44]
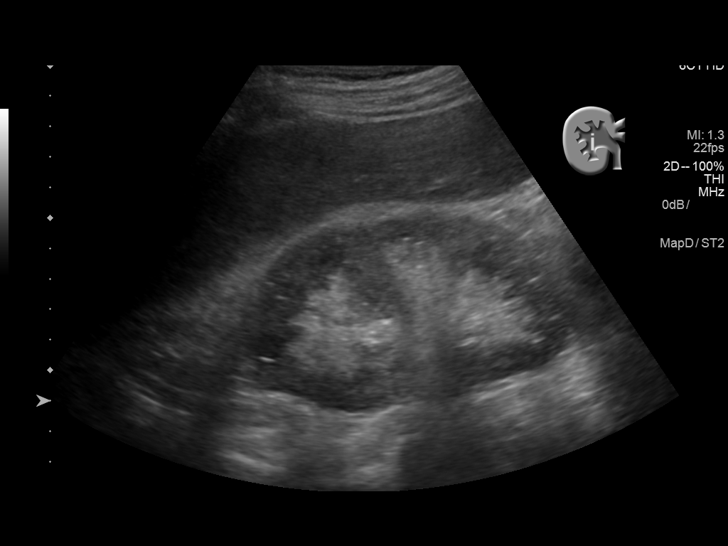
[im 8/44]
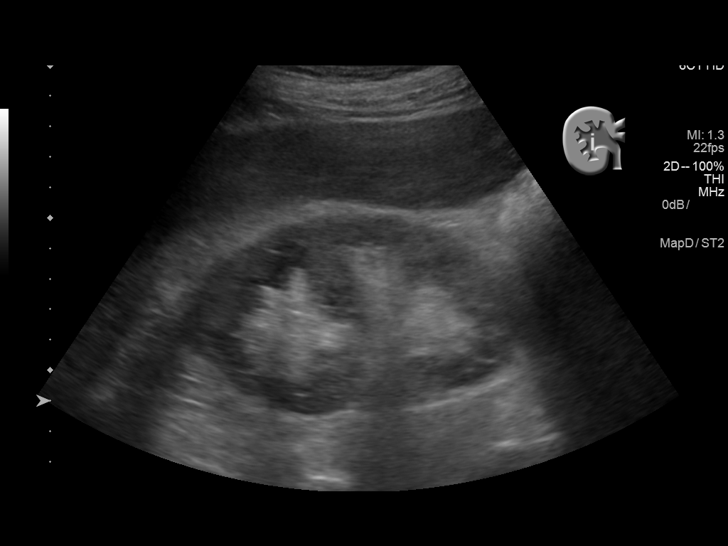
[im 11/44]
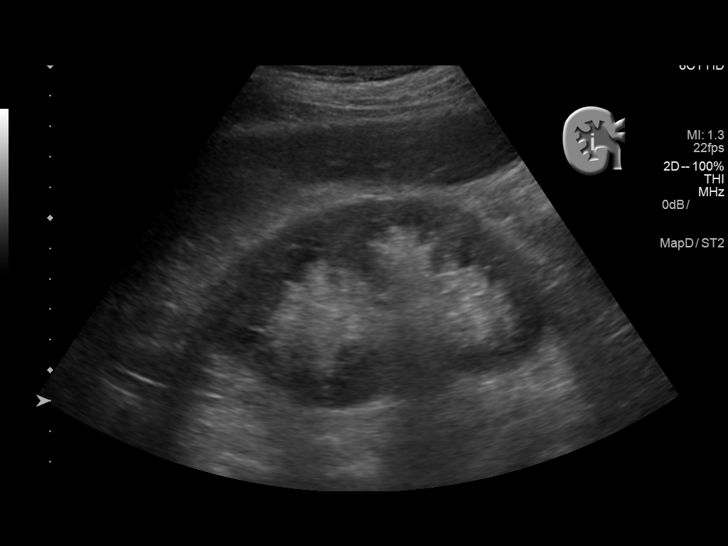
[im 15/44]
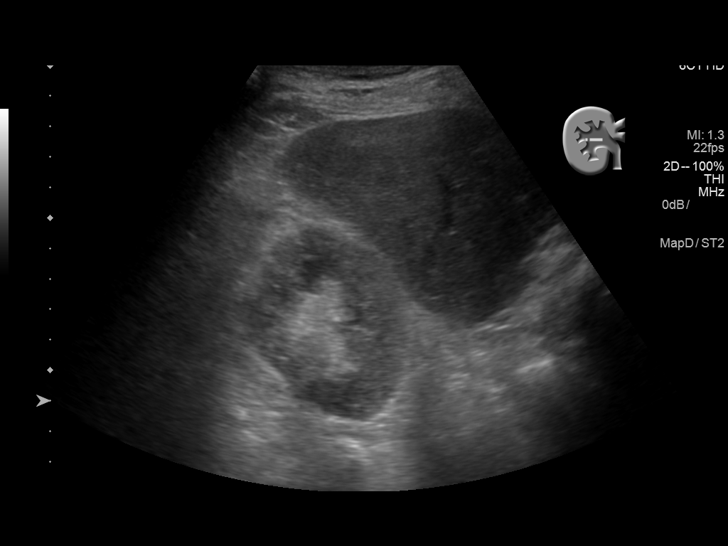
[im 17/44]
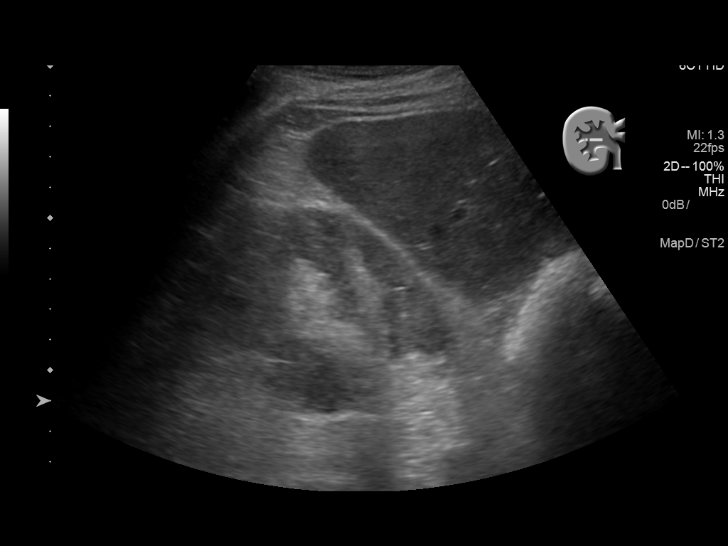
[im 20/44]
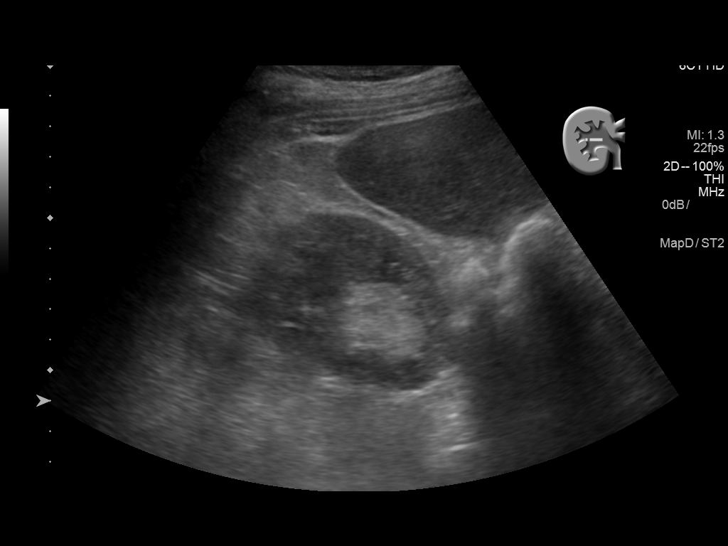
[im 24/44]
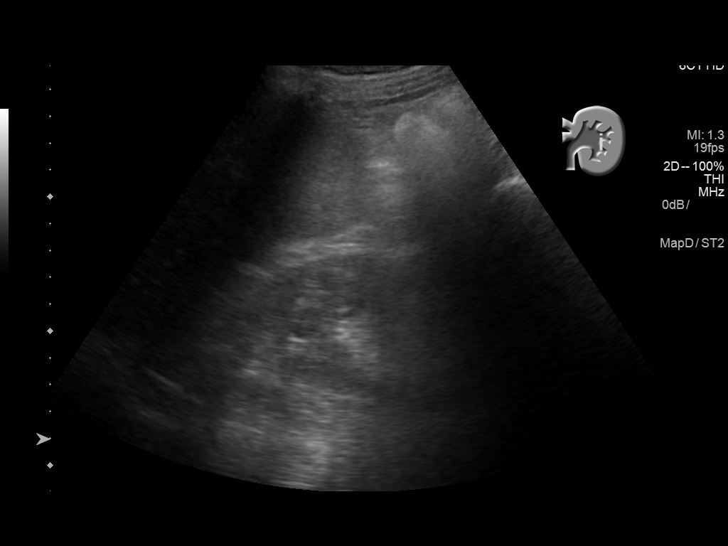
[im 27/44]
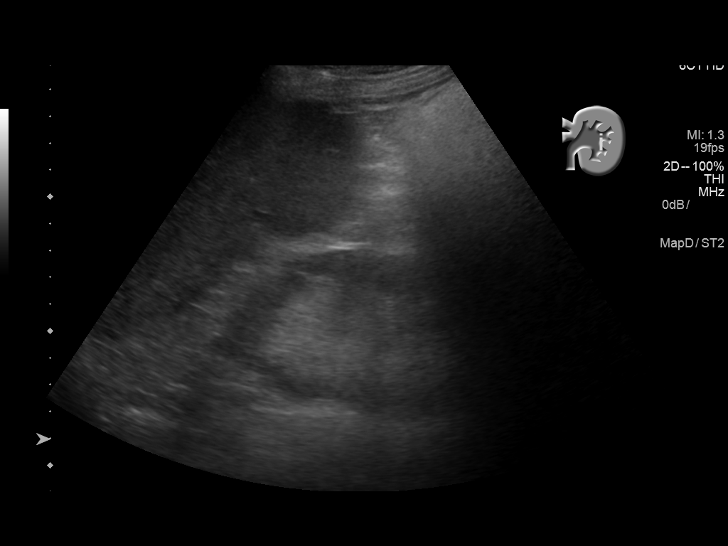
[im 29/44]
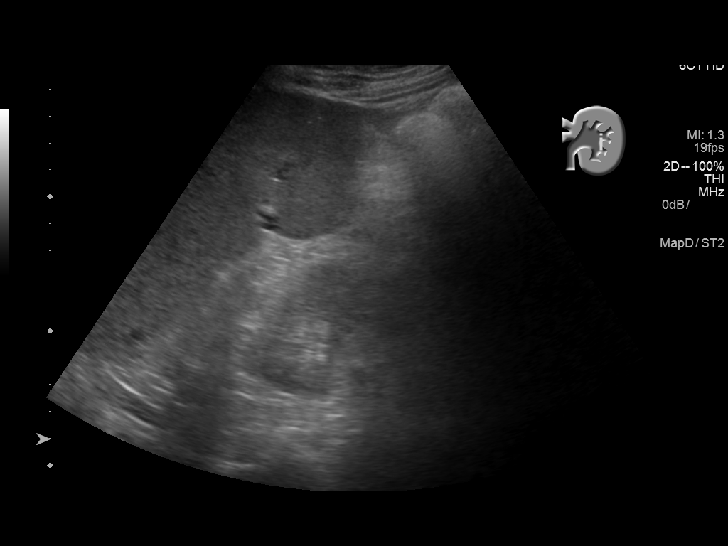
[im 33/44]
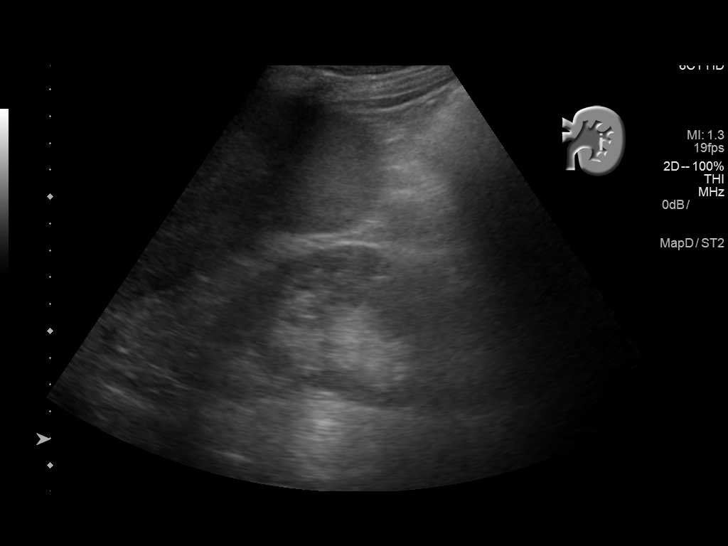
[im 36/44]
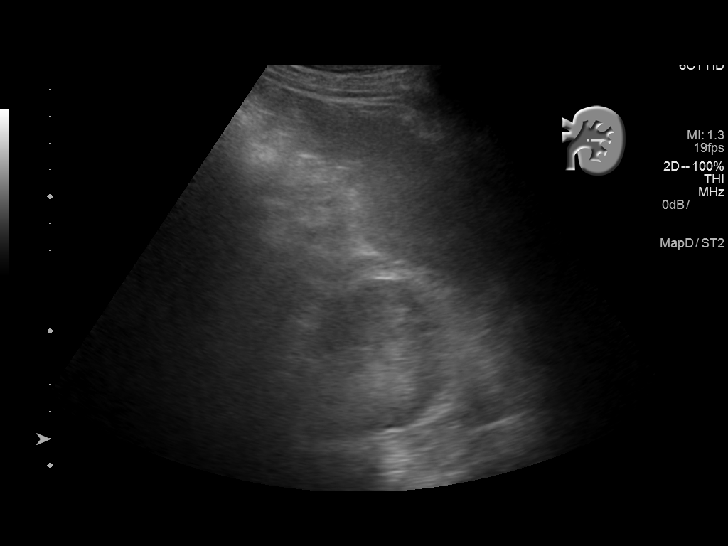
[im 40/44]
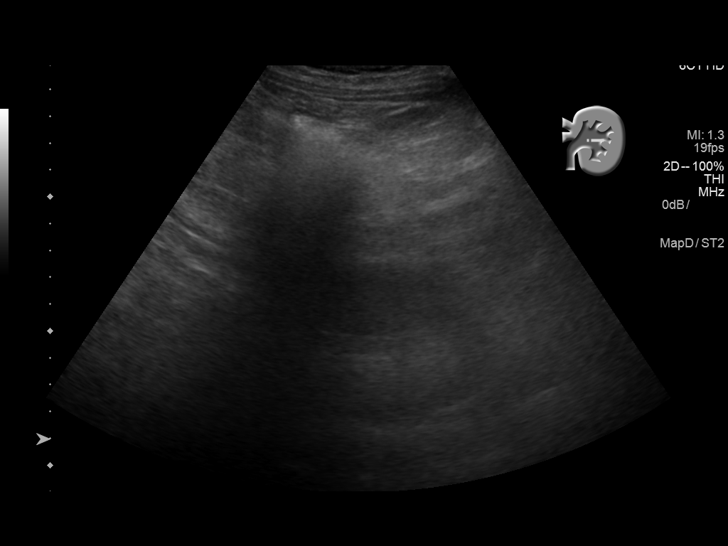
[im 44/44]
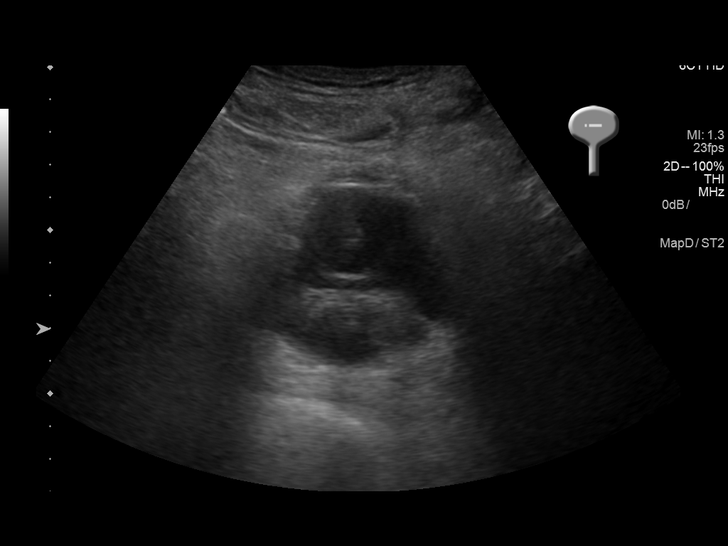

[14 of 25 positions shown; findings below may reference images not displayed]

FINDINGS: Right Kidney:

Length: 12.5 cm. Echogenicity within normal limits. No mass or
hydronephrosis visualized.

Left Kidney:

Length: 12.3 cm. Echogenicity within normal limits. No mass or
hydronephrosis visualized.

Bladder:

Foley catheter in the bladder.  Bladder nondistended.
IMPRESSION: No acute or focal abnormality identified. No evidence of
hydronephrosis. Foley catheter noted in the bladder. Bladder
nondistended.

## 2017-08-02 ENCOUNTER — Inpatient Hospital Stay: Payer: Medicare Other | Attending: Nurse Practitioner

## 2017-08-02 ENCOUNTER — Telehealth: Payer: Self-pay | Admitting: Oncology

## 2017-08-02 ENCOUNTER — Encounter: Payer: Self-pay | Admitting: Oncology

## 2017-08-02 ENCOUNTER — Inpatient Hospital Stay (HOSPITAL_BASED_OUTPATIENT_CLINIC_OR_DEPARTMENT_OTHER): Payer: Medicare Other | Admitting: Oncology

## 2017-08-02 ENCOUNTER — Inpatient Hospital Stay: Payer: Medicare Other

## 2017-08-02 VITALS — BP 106/42 | HR 65 | Temp 97.7°F | Resp 18 | Ht 67.0 in | Wt 131.4 lb

## 2017-08-02 DIAGNOSIS — Z5112 Encounter for antineoplastic immunotherapy: Secondary | ICD-10-CM | POA: Insufficient documentation

## 2017-08-02 DIAGNOSIS — R1310 Dysphagia, unspecified: Secondary | ICD-10-CM | POA: Insufficient documentation

## 2017-08-02 DIAGNOSIS — C342 Malignant neoplasm of middle lobe, bronchus or lung: Secondary | ICD-10-CM

## 2017-08-02 DIAGNOSIS — M25562 Pain in left knee: Secondary | ICD-10-CM | POA: Insufficient documentation

## 2017-08-02 DIAGNOSIS — C3411 Malignant neoplasm of upper lobe, right bronchus or lung: Secondary | ICD-10-CM | POA: Insufficient documentation

## 2017-08-02 DIAGNOSIS — E039 Hypothyroidism, unspecified: Secondary | ICD-10-CM

## 2017-08-02 DIAGNOSIS — Z79899 Other long term (current) drug therapy: Secondary | ICD-10-CM | POA: Diagnosis not present

## 2017-08-02 DIAGNOSIS — M25552 Pain in left hip: Secondary | ICD-10-CM | POA: Diagnosis not present

## 2017-08-02 LAB — CBC WITH DIFFERENTIAL/PLATELET
BASOS ABS: 0 10*3/uL (ref 0.0–0.1)
Basophils Relative: 1 %
EOS PCT: 1 %
Eosinophils Absolute: 0.1 10*3/uL (ref 0.0–0.5)
HCT: 37.3 % — ABNORMAL LOW (ref 38.4–49.9)
Hemoglobin: 13 g/dL (ref 13.0–17.1)
LYMPHS PCT: 15 %
Lymphs Abs: 0.8 10*3/uL — ABNORMAL LOW (ref 0.9–3.3)
MCH: 29.5 pg (ref 27.2–33.4)
MCHC: 34.8 g/dL (ref 32.0–36.0)
MCV: 84.8 fL (ref 79.3–98.0)
MONO ABS: 0.3 10*3/uL (ref 0.1–0.9)
MONOS PCT: 6 %
Neutro Abs: 4.3 10*3/uL (ref 1.5–6.5)
Neutrophils Relative %: 77 %
PLATELETS: 166 10*3/uL (ref 140–400)
RBC: 4.4 MIL/uL (ref 4.20–5.82)
RDW: 15.2 % — AB (ref 11.0–14.6)
WBC: 5.6 10*3/uL (ref 4.0–10.3)

## 2017-08-02 LAB — COMPREHENSIVE METABOLIC PANEL
ALBUMIN: 3.6 g/dL (ref 3.5–5.0)
ALK PHOS: 125 U/L (ref 40–150)
ALT: 15 U/L (ref 0–55)
AST: 22 U/L (ref 5–34)
Anion gap: 9 (ref 3–11)
BILIRUBIN TOTAL: 0.6 mg/dL (ref 0.2–1.2)
BUN: 12 mg/dL (ref 7–26)
CALCIUM: 9.6 mg/dL (ref 8.4–10.4)
CO2: 23 mmol/L (ref 22–29)
Chloride: 102 mmol/L (ref 98–109)
Creatinine, Ser: 0.86 mg/dL (ref 0.70–1.30)
GFR calc Af Amer: >60 mL/min (ref >60)
GFR calc non Af Amer: >60 mL/min (ref >60)
GLUCOSE: 121 mg/dL (ref 70–140)
POTASSIUM: 4.1 mmol/L (ref 3.5–5.1)
Sodium: 134 mmol/L — ABNORMAL LOW (ref 136–145)
TOTAL PROTEIN: 7 g/dL (ref 6.4–8.3)

## 2017-08-02 LAB — TSH: TSH: 0.515 u[IU]/mL (ref 0.320–4.118)

## 2017-08-02 MED ORDER — SODIUM CHLORIDE 0.9 % IV SOLN
10.0000 mg/kg | Freq: Once | INTRAVENOUS | Status: AC
  Administered 2017-08-02: 620 mg via INTRAVENOUS
  Filled 2017-08-02: qty 2.4

## 2017-08-02 MED ORDER — SODIUM CHLORIDE 0.9 % IV SOLN
Freq: Once | INTRAVENOUS | Status: AC
  Administered 2017-08-02: 10:00:00 via INTRAVENOUS

## 2017-08-02 NOTE — Progress Notes (Signed)
[facility] OFFICE PROGRESS NOTE  [name] Kirschner, MD [address] [place] [address]  DIAGNOSIS: Stage IIIA (T1b, N2, M0) non-small cell lung cancer, adenocarcinoma with positive PDL 1 expression 50% diagnosed in March 2018 and presented with right upper lobe lung nodule in addition to mediastinal lymphadenopathy.  PRIOR THERAPY: Course of concurrent chemoradiation with weekly carboplatin for AUC of 2 and paclitaxel 45 MG/M2. First dose of chemotherapy 10/17/2016. Status post 8 cycles. Last dose was given 12/05/2016 with partial response.  CURRENT THERAPY: Consolidation treatment with immunotherapy with Imfinzi (Durvalumab) 10 MG/KG every 2 weeks. First dose 01/18/2017. Status post14cycles.  INTERVAL HISTORY: [name] [name] 77 y.o. male returns for routine follow-up visit accompanied by his wife.  Patient is feeling fine today with no specific complaints except for increased left knee and left hip pain.  The left hip is where he had his prior fracture in the left knee he has had surgery on in the past.  The patient reports being more active due to the warm weather.  The pain seems to come and go and he uses Tylenol which is effective.  He denies fevers and chills.  Denies chest pain, shortness breath, cough, hemoptysis.  Denies nausea, vomiting, constipation, diarrhea.  Patient is here for evaluation prior to cycle #15 of his treatment.  MEDICAL HISTORY: Past Medical History:  Diagnosis Date  . Adenocarcinoma of right lung, stage 3 ([facility]) 09/20/2016  . Atrial fibrillation ([place] [facility])    no hx of reported at preop visit of 04/21/11   . CAD (coronary artery disease)   . Carotid artery occlusion    left carotid endarterectomy  . Chronic kidney disease    AAA repair with reimplant of renals   . CHRONIC OBSTRUCTIVE PULMONARY DISEASE    pt denied at visit of 04/21/11   . CORONARY ARTERY DISEASE   . Dehydration 11/15/2016  . Disorder of left sacroiliac joint 08/24/2016  .  DIVERTICULOSIS OF COLON   . Encounter for antineoplastic chemotherapy 10/17/2016  . GERD   . H/O hiatal hernia   . Headache(784.0)    Hx: of years of years  . HYPERTENSION   . HYPOTHYROIDISM   . JOINT EFFUSION, KNEE   . KNEE, ARTHRITIS, DEGEN./OSTEO    right knee   . Myocardial infarction ([facility])    1994   . NEPHROLITHIASIS, HX OF   . OSTEOPOROSIS   . Other dysphagia   . PERIPHERAL VASCULAR DISEASE    AAA - 1994 with reimplant of renals   . Peripheral vascular disease ([facility])    subclavian stenosis PTA - 3/08   . Pure hypercholesterolemia   . Renal artery stenosis ([facility])     ALLERGIES:  is allergic to neurontin [gabapentin]; imitrex [sumatriptan]; and lipitor [atorvastatin].  MEDICATIONS:  Current Outpatient Medications  Medication Sig Dispense Refill  . acetaminophen (TYLENOL) 325 MG tablet Take 325-650 mg by mouth every 6 (six) hours as needed for headache (for pain).     . bumetanide (BUMEX) 1 MG tablet Take 1 tablet (1 mg total) by mouth daily as needed (swelling). 30 tablet 1  . donepezil (ARICEPT) 5 MG tablet Take 1 tablet (5 mg total) by mouth at bedtime. 90 tablet 1  . lansoprazole (PREVACID) 15 MG capsule Take 1 capsule (15 mg total) by mouth daily. 30 capsule 5  . levothyroxine (SYNTHROID, LEVOTHROID) 112 MCG tablet Take 1 tablet (112 mcg total) by mouth daily. 90 tablet 3  . magic mouthwash w/lidocaine SOLN  Composition: 80 cc each of Extra St. Maalox Plus, Diphenhydramine, and Nystatin. Plus 240 cc of 2% viscous lidocaine to make 480 cc. Swish and swallow 2 tsp four times per day. 480 mL 2  . metoprolol tartrate (LOPRESSOR) 25 MG tablet TAKE ONE-HALF TABLET BY MOUTH TWO TIMES DAILY 30 tablet 5  . niacin (SLO-NIACIN) 500 MG tablet Take 3 tablets (1,500 mg total) by mouth at bedtime. 30 tablet 0  . potassium chloride SA (K-DUR,KLOR-CON) 20 MEQ tablet Take 1 tablet (20 mEq total) by mouth daily. 90 tablet 0  . rosuvastatin (CRESTOR) 20 MG tablet Take 1 tablet (20 mg total) by  mouth daily. 30 tablet 11  . sucralfate (CARAFATE) 1 GM/10ML suspension Take 10 mLs (1 g total) by mouth 4 (four) times daily -  with meals and at bedtime. 420 mL 1  . tamsulosin (FLOMAX) 0.4 MG CAPS capsule Take 1 capsule (0.4 mg total) by mouth daily after breakfast. 30 capsule 1  . warfarin (COUMADIN) 5 MG tablet Take one tablet daily. take as directed by physician. 90 tablet 1  . ALPRAZolam (XANAX) 0.25 MG tablet Take 1 tablet (0.25 mg total) by mouth 3 (three) times daily as needed for anxiety. (Patient not taking: Reported on 07/05/2017) 30 tablet 0  . linaclotide (LINZESS) 145 MCG CAPS capsule Take 1 capsule (145 mcg total) by mouth daily before breakfast. (Patient not taking: Reported on 07/05/2017) 30 capsule 11  . nitroGLYCERIN (NITROSTAT) 0.4 MG SL tablet Place 1 tablet (0.4 mg total) under the tongue every 5 (five) minutes as needed for chest pain. (Patient not taking: Reported on 07/05/2017) 25 tablet 10  . ondansetron (ZOFRAN) 8 MG tablet Take 1 tablet (8 mg total) by mouth every 8 (eight) hours as needed for nausea or vomiting. (Patient not taking: Reported on 05/10/2017) 20 tablet 0  . prochlorperazine (COMPAZINE) 10 MG tablet Take 1 tablet (10 mg total) by mouth every 6 (six) hours as needed for nausea or vomiting. (Patient not taking: Reported on 05/10/2017) 30 tablet 0   No current facility-administered medications for this visit.     SURGICAL HISTORY:  Past Surgical History:  Procedure Laterality Date  . ABDOMINAL AORTIC ANEURYSM REPAIR     with reimplantation of renals   . CARDIAC CATHETERIZATION     2008  . CAROTID ENDARTERECTOMY Left Jan. 30, 2015   CEA  . CHOLECYSTECTOMY  2000   Gall Bladder  . COLONOSCOPY W/ BIOPSIES AND POLYPECTOMY     Hx: of  . CORONARY ARTERY BYPASS GRAFT     1995  . CORONARY STENT PLACEMENT     Hx: of  . ENDARTERECTOMY Left 07/19/2013   Procedure: ENDARTERECTOMY CAROTID;  Surgeon: Mal Misty, MD;  Location: [facility];  Service: Vascular;   Laterality: Left;  . GALLBLADDER SURGERY  2004   . JOINT REPLACEMENT     partial knee replacement on left 2002   . KNEE SURGERY    . ORIF PELVIC FRACTURE Left 08/25/2016   Procedure: OPEN REDUCTION INTERNAL FIXATION (ORIF) PELVIC FRACTURE; plate on front, SI screw on the back;  Surgeon: Altamese Buenaventura Lakes, MD;  Location: [facility];  Service: Orthopedics;  Laterality: Left;  . OTHER SURGICAL HISTORY     left subclavian stenosis surgery PTA 08/2006   . OTHER SURGICAL HISTORY     carotid surgery on right 2004   . TOTAL KNEE ARTHROPLASTY  04/29/2011   Procedure: TOTAL KNEE ARTHROPLASTY;  Surgeon: Gearlean Alf;  Location: [facility];  Service:  Orthopedics;  Laterality: Right;  . urinary retention    . VASCULAR SURGERY     AAA  . VASECTOMY  1973  . VIDEO BRONCHOSCOPY WITH ENDOBRONCHIAL ULTRASOUND [name] 09/09/2016   Procedure: VIDEO BRONCHOSCOPY WITH ENDOBRONCHIAL ULTRASOUND;  Surgeon: Grace Isaac, MD;  Location: [facility];  Service: Thoracic;  Laterality: [name];    REVIEW OF SYSTEMS:   Review of Systems  Constitutional: Negative for appetite change, chills, fatigue, fever and unexpected weight change.  HENT:   Negative for mouth sores, nosebleeds, sore throat and trouble swallowing.   Eyes: Negative for eye problems and icterus.  Respiratory: Negative for cough, hemoptysis, shortness of breath and wheezing.   Cardiovascular: Negative for chest pain and leg swelling.  Gastrointestinal: Negative for abdominal pain, constipation, diarrhea, nausea and vomiting.  Genitourinary: Negative for bladder incontinence, difficulty urinating, dysuria, frequency and hematuria.   Musculoskeletal: Negative for back pain, gait problem, neck pain and neck stiffness. Positive for intermittent left hip and knee pain. Skin: Negative for itching and rash.  Neurological: Negative for dizziness, extremity weakness, gait problem, headaches, light-headedness and seizures.  Hematological: Negative for adenopathy. Does not  bruise/bleed easily.  Psychiatric/Behavioral: Negative for confusion, depression and sleep disturbance. The patient is not nervous/anxious.     PHYSICAL EXAMINATION:  Blood pressure (!) 106/42, pulse 65, temperature 97.7 F (36.5 C), temperature source Oral, resp. rate 18, height 5\' 7"  (1.702 m), weight 131 lb 6.4 oz (59.6 kg), SpO2 100 %.  ECOG PERFORMANCE STATUS: 1 - Symptomatic but completely ambulatory  Physical Exam  Constitutional: Oriented to person, place, and time and well-developed, well-nourished, and in no distress. No distress.  HENT:  Head: Normocephalic and atraumatic.  Mouth/Throat: Oropharynx is clear and moist. No oropharyngeal exudate.  Eyes: Conjunctivae are normal. Right eye exhibits no discharge. Left eye exhibits no discharge. No scleral icterus.  Neck: Normal range of motion. Neck supple.  Cardiovascular: Normal rate, regular rhythm, normal heart sounds and intact distal pulses.   Pulmonary/Chest: Effort normal and breath sounds normal. No respiratory distress. No wheezes. No rales.  Abdominal: Soft. Bowel sounds are normal. Exhibits no distension and no mass. There is no tenderness.  Musculoskeletal: Normal range of motion. Exhibits no edema. Crepitus noticed to left knee and left hip. Lymphadenopathy:    No cervical adenopathy.  Neurological: Alert and oriented to person, place, and time. Exhibits normal muscle tone. Gait normal. Coordination normal.  Skin: Skin is warm and dry. No rash noted. Not diaphoretic. No erythema. No pallor.  Psychiatric: Mood, memory and judgment normal.  Vitals reviewed.  LABORATORY DATA: Lab Results  Component Value Date   WBC 5.6 08/02/2017   HGB 13.0 08/02/2017   HCT 37.3 (L) 08/02/2017   MCV 84.8 08/02/2017   PLT 166 08/02/2017      Chemistry      Component Value Date/Time   NA 134 (L) 08/02/2017 0831   NA 136 06/21/2017 1246   K 4.1 08/02/2017 0831   K 3.6 06/21/2017 1246   CL 102 08/02/2017 0831   CO2 23  08/02/2017 0831   CO2 25 06/21/2017 1246   BUN 12 08/02/2017 0831   BUN 14.0 06/21/2017 1246   CREATININE 0.86 08/02/2017 0831   CREATININE 0.8 06/21/2017 1246      Component Value Date/Time   CALCIUM 9.6 08/02/2017 0831   CALCIUM 9.4 06/21/2017 1246   ALKPHOS 125 08/02/2017 0831   ALKPHOS 139 06/21/2017 1246   AST 22 08/02/2017 0831   AST 18 06/21/2017 1246  ALT 15 08/02/2017 0831   ALT 17 06/21/2017 1246   BILITOT 0.6 08/02/2017 0831   BILITOT 0.41 06/21/2017 1246       RADIOGRAPHIC STUDIES:  No results found.   ASSESSMENT/PLAN:  T1b N2 M0 disease (stage IIIA).adenocarcinoma of the right middle lobe of lung [facility]) This is a very pleasant 77 year old white male with a stage IIIa non-small cell lung cancer, adenocarcinoma with positive PDL 1 expression of 50%. The patient completed a course of concurrent chemoradiation with weekly carboplatin and paclitaxel status post 8 weeks and tolerated his treatment well except for mild dysphagia and odynophagia. He is currently undergoing consolidation immunotherapy with Imfinzi (Durvalumab) status post14cycles. He tolerated the last cycle of his treatment fairly well with no significant adverse effects. Recommend that he proceed with cycle 15 of Imfinzi today as scheduled.  We will see him back for follow-up visit in 2 weeks for evaluation before the next dose of his treatment.  For his left knee and hip pain, I recommend that he use Tylenol up to 3000 mg/day.  He plans to follow-up with his primary care provider later this month and may need to see his orthopedic surgeon if his pain persists.  The patient was advised to call immediately if he has any concerning symptoms in the interval. The patient voices understanding of current disease status and treatment options and is in agreement with the current care plan. All questions were answered. The patient knows to call the clinic with any problems, questions or concerns. We can  certainly see the patient much sooner if necessary.  No orders of the defined types were placed in this encounter.   [name] Bussing, DNP, AGPCNP-BC, AOCNP 08/02/17

## 2017-08-02 NOTE — Telephone Encounter (Signed)
Scheduled appt per 2/13 los - patient to get an updated schedule next visit.

## 2017-08-02 NOTE — Assessment & Plan Note (Signed)
This is a very pleasant 77 year old white male with a stage IIIa non-small cell lung cancer, adenocarcinoma with positive PDL 1 expression of 50%. The patient completed a course of concurrent chemoradiation with weekly carboplatin and paclitaxel status post 8 weeks and tolerated his treatment well except for mild dysphagia and odynophagia. He is currently undergoing consolidation immunotherapy with Imfinzi (Durvalumab) status post14cycles. He tolerated the last cycle of his treatment fairly well with no significant adverse effects. Recommend that he proceed with cycle 15 of Imfinzi today as scheduled.  We will see him back for follow-up visit in 2 weeks for evaluation before the next dose of his treatment.  For his left knee and hip pain, I recommend that he use Tylenol up to 3000 mg/day.  He plans to follow-up with his primary care provider later this month and may need to see his orthopedic surgeon if his pain persists.  The patient was advised to call immediately if he has any concerning symptoms in the interval. The patient voices understanding of current disease status and treatment options and is in agreement with the current care plan. All questions were answered. The patient knows to call the clinic with any problems, questions or concerns. We can certainly see the patient much sooner if necessary.

## 2017-08-02 NOTE — Patient Instructions (Signed)
[facility] Discharge Instructions for Patients Receiving Chemotherapy  Today you received the following chemotherapy agents: Imfinzi.  To help prevent nausea and vomiting after your treatment, we encourage you to take your nausea medication as directed.   If you develop nausea and vomiting that is not controlled by your nausea medication, call the clinic.   BELOW ARE SYMPTOMS THAT SHOULD BE REPORTED IMMEDIATELY:  *FEVER GREATER THAN 100.5 F  *CHILLS WITH OR WITHOUT FEVER  NAUSEA AND VOMITING THAT IS NOT CONTROLLED WITH YOUR NAUSEA MEDICATION  *UNUSUAL SHORTNESS OF BREATH  *UNUSUAL BRUISING OR BLEEDING  TENDERNESS IN MOUTH AND THROAT WITH OR WITHOUT PRESENCE OF ULCERS  *URINARY PROBLEMS  *BOWEL PROBLEMS  UNUSUAL RASH Items with * indicate a potential emergency and should be followed up as soon as possible.  Feel free to call the clinic should you have any questions or concerns. The clinic phone number is [phone].  Please show the CHEMO ALERT CARD at check-in to the Emergency Department and triage nurse.   

## 2017-08-04 ENCOUNTER — Other Ambulatory Visit: Payer: Self-pay | Admitting: Family Medicine

## 2017-08-12 ENCOUNTER — Other Ambulatory Visit: Payer: Self-pay | Admitting: Family Medicine

## 2017-08-16 ENCOUNTER — Inpatient Hospital Stay: Payer: Medicare Other

## 2017-08-16 ENCOUNTER — Inpatient Hospital Stay (HOSPITAL_BASED_OUTPATIENT_CLINIC_OR_DEPARTMENT_OTHER): Payer: Medicare Other | Admitting: Internal Medicine

## 2017-08-16 ENCOUNTER — Telehealth: Payer: Self-pay | Admitting: Internal Medicine

## 2017-08-16 ENCOUNTER — Encounter: Payer: Self-pay | Admitting: Internal Medicine

## 2017-08-16 VITALS — BP 121/54 | HR 69 | Temp 97.6°F | Resp 18 | Ht 67.0 in | Wt 132.5 lb

## 2017-08-16 DIAGNOSIS — C342 Malignant neoplasm of middle lobe, bronchus or lung: Secondary | ICD-10-CM

## 2017-08-16 DIAGNOSIS — C3411 Malignant neoplasm of upper lobe, right bronchus or lung: Secondary | ICD-10-CM | POA: Diagnosis not present

## 2017-08-16 DIAGNOSIS — Z5112 Encounter for antineoplastic immunotherapy: Secondary | ICD-10-CM | POA: Diagnosis not present

## 2017-08-16 DIAGNOSIS — M25552 Pain in left hip: Secondary | ICD-10-CM | POA: Diagnosis not present

## 2017-08-16 DIAGNOSIS — Z79899 Other long term (current) drug therapy: Secondary | ICD-10-CM | POA: Diagnosis not present

## 2017-08-16 DIAGNOSIS — R1310 Dysphagia, unspecified: Secondary | ICD-10-CM | POA: Diagnosis not present

## 2017-08-16 LAB — CBC WITH DIFFERENTIAL/PLATELET
Basophils Absolute: 0 10*3/uL (ref 0.0–0.1)
Basophils Relative: 0 %
Eosinophils Absolute: 0.1 10*3/uL (ref 0.0–0.5)
Eosinophils Relative: 1 %
HEMATOCRIT: 37 % — AB (ref 38.4–49.9)
HEMOGLOBIN: 12.7 g/dL — AB (ref 13.0–17.1)
LYMPHS ABS: 1.2 10*3/uL (ref 0.9–3.3)
LYMPHS PCT: 24 %
MCH: 29.5 pg (ref 27.2–33.4)
MCHC: 34.3 g/dL (ref 32.0–36.0)
MCV: 86 fL (ref 79.3–98.0)
Monocytes Absolute: 0.3 10*3/uL (ref 0.1–0.9)
Monocytes Relative: 5 %
NEUTROS ABS: 3.6 10*3/uL (ref 1.5–6.5)
NEUTROS PCT: 70 %
Platelets: 114 10*3/uL — ABNORMAL LOW (ref 140–400)
RBC: 4.3 MIL/uL (ref 4.20–5.82)
RDW: 15.5 % — ABNORMAL HIGH (ref 11.0–14.6)
WBC: 5.1 10*3/uL (ref 4.0–10.3)

## 2017-08-16 LAB — COMPREHENSIVE METABOLIC PANEL
ALT: 21 U/L (ref 0–55)
AST: 25 U/L (ref 5–34)
Albumin: 3.4 g/dL — ABNORMAL LOW (ref 3.5–5.0)
Alkaline Phosphatase: 130 U/L (ref 40–150)
Anion gap: 8 (ref 3–11)
BUN: 11 mg/dL (ref 7–26)
CHLORIDE: 104 mmol/L (ref 98–109)
CO2: 24 mmol/L (ref 22–29)
Calcium: 9.5 mg/dL (ref 8.4–10.4)
Creatinine, Ser: 0.86 mg/dL (ref 0.70–1.30)
Glucose, Bld: 107 mg/dL (ref 70–140)
POTASSIUM: 3.7 mmol/L (ref 3.5–5.1)
Sodium: 136 mmol/L (ref 136–145)
Total Bilirubin: 0.5 mg/dL (ref 0.2–1.2)
Total Protein: 6.6 g/dL (ref 6.4–8.3)

## 2017-08-16 MED ORDER — SODIUM CHLORIDE 0.9 % IV SOLN
Freq: Once | INTRAVENOUS | Status: AC
  Administered 2017-08-16: 10:00:00 via INTRAVENOUS

## 2017-08-16 MED ORDER — SODIUM CHLORIDE 0.9 % IV SOLN
10.0000 mg/kg | Freq: Once | INTRAVENOUS | Status: AC
  Administered 2017-08-16: 620 mg via INTRAVENOUS
  Filled 2017-08-16: qty 10

## 2017-08-16 NOTE — Progress Notes (Signed)
[place] [phone]   [phone]  OFFICE PROGRESS NOTE  [name] Kirschner, MD [address] [place] [address]  DIAGNOSIS: Stage IIIA (T1b, N2, M0) non-small cell lung cancer, adenocarcinoma with positive PDL 1 expression 50% diagnosed in March 2018 and presented with right upper lobe lung nodule in addition to mediastinal lymphadenopathy.  PRIOR THERAPY: Course of concurrent chemoradiation with weekly carboplatin for AUC of 2 and paclitaxel 45 MG/M2. First dose of chemotherapy 10/17/2016. Status post 8 cycles. Last dose was given 12/05/2016 with partial response.[name]  CURRENT THERAPY: Consolidation treatment with immunotherapy with Imfinzi (Durvalumab) 10 MG/KG every 2 weeks. First dose 01/18/2017. Status post 15 cycles.  INTERVAL HISTORY: [name] [name] 77 y.o. male returns to the clinic today for follow-up visit accompanied by his wife.  The patient is feeling fine today with no specific complaints he is more active and has been working in his yard.  He denied having any chest pain, shortness of breath, cough or hemoptysis.  He denied having any weight loss or night sweats.  He has no nausea, vomiting, diarrhea or constipation.  He has been tolerating this treatment with immunotherapy fairly well.  He is here today for evaluation before starting cycle #16.  MEDICAL HISTORY: Past Medical History:  Diagnosis Date  . Adenocarcinoma of right lung, stage 3 ([facility]) 09/20/2016  . Atrial fibrillation ([facility])    no hx of reported at preop visit of 04/21/11   . CAD (coronary artery disease)   . Carotid artery occlusion    left carotid endarterectomy  . Chronic kidney disease    AAA repair with reimplant of renals   . CHRONIC OBSTRUCTIVE PULMONARY DISEASE    pt denied at visit of 04/21/11   . CORONARY ARTERY DISEASE   . Dehydration 11/15/2016  . Disorder of left sacroiliac joint 08/24/2016  . DIVERTICULOSIS OF COLON   . Encounter for  antineoplastic chemotherapy 10/17/2016  . GERD   . H/O hiatal hernia   . Headache(784.0)    Hx: of years of years  . HYPERTENSION   . HYPOTHYROIDISM   . JOINT EFFUSION, KNEE   . KNEE, ARTHRITIS, DEGEN./OSTEO    right knee   . Myocardial infarction ([place])    1994   . NEPHROLITHIASIS, HX OF   . OSTEOPOROSIS   . Other dysphagia   . PERIPHERAL VASCULAR DISEASE    AAA - 1994 with reimplant of renals   . Peripheral vascular disease ([place])    subclavian stenosis PTA - 3/08   . Pure hypercholesterolemia   . Renal artery stenosis ([facility])     ALLERGIES:  is allergic to neurontin [gabapentin]; imitrex [sumatriptan]; and lipitor [atorvastatin].  MEDICATIONS:  Current Outpatient Medications  Medication Sig Dispense Refill  . acetaminophen (TYLENOL) 325 MG tablet Take 325-650 mg by mouth every 6 (six) hours as needed for headache (for pain).     [name] ALPRAZolam (XANAX) 0.25 MG tablet Take 1 tablet (0.25 mg total) by mouth 3 (three) times daily as needed for anxiety. 30 tablet 0  . bumetanide (BUMEX) 1 MG tablet Take 1 tablet (1 mg total) by mouth daily as needed (swelling). 30 tablet 1  . donepezil (ARICEPT) 5 MG tablet TAKE ONE TABLET BY MOUTH AT BEDTIME. 90 tablet 1  . lansoprazole (PREVACID) 15 MG capsule Take 1 capsule (15 mg total) by mouth daily. 30 capsule 5  . levothyroxine (SYNTHROID, LEVOTHROID) 112 MCG tablet Take 1 tablet (112 mcg  total) by mouth daily. 90 tablet 3  . metoprolol tartrate (LOPRESSOR) 25 MG tablet TAKE ONE-HALF TABLET BY MOUTH TWO TIMES DAILY 30 tablet 5  . niacin (SLO-NIACIN) 500 MG tablet Take 3 tablets (1,500 mg total) by mouth at bedtime. 30 tablet 0  . potassium chloride SA (K-DUR,KLOR-CON) 20 MEQ tablet Take 1 tablet (20 mEq total) by mouth daily. 90 tablet 0  . rosuvastatin (CRESTOR) 20 MG tablet Take 1 tablet (20 mg total) by mouth daily. 30 tablet 11  . sucralfate (CARAFATE) 1 GM/10ML suspension Take 10 mLs (1 g total) by mouth 4 (four) times daily -  with meals  and at bedtime. 420 mL 1  . tamsulosin (FLOMAX) 0.4 MG CAPS capsule Take 1 capsule (0.4 mg total) by mouth daily after breakfast. 30 capsule 1  . warfarin (COUMADIN) 5 MG tablet TAKE 1 TABLET DAILY. TAKE AS DIRECTED BYPHYSICIAN 90 tablet 1  . linaclotide (LINZESS) 145 MCG CAPS capsule Take 1 capsule (145 mcg total) by mouth daily before breakfast. (Patient not taking: Reported on 07/05/2017) 30 capsule 11  . magic mouthwash w/lidocaine SOLN Composition: 80 cc each of Extra St. Maalox Plus, Diphenhydramine, and Nystatin. Plus 240 cc of 2% viscous lidocaine to make 480 cc. Swish and swallow 2 tsp four times per day. (Patient not taking: Reported on 08/16/2017) 480 mL 2  . nitroGLYCERIN (NITROSTAT) 0.4 MG SL tablet Place 1 tablet (0.4 mg total) under the tongue every 5 (five) minutes as needed for chest pain. (Patient not taking: Reported on 07/05/2017) 25 tablet 10  . ondansetron (ZOFRAN) 8 MG tablet Take 1 tablet (8 mg total) by mouth every 8 (eight) hours as needed for nausea or vomiting. (Patient not taking: Reported on 05/10/2017) 20 tablet 0  . prochlorperazine (COMPAZINE) 10 MG tablet Take 1 tablet (10 mg total) by mouth every 6 (six) hours as needed for nausea or vomiting. (Patient not taking: Reported on 05/10/2017) 30 tablet 0   No current facility-administered medications for this visit.     SURGICAL HISTORY:  Past Surgical History:  Procedure Laterality Date  . ABDOMINAL AORTIC ANEURYSM REPAIR     with reimplantation of renals   . CARDIAC CATHETERIZATION     2008  . CAROTID ENDARTERECTOMY Left Jan. 30, 2015   CEA  . CHOLECYSTECTOMY  2000   Gall Bladder  . COLONOSCOPY W/ BIOPSIES AND POLYPECTOMY     Hx: of  . CORONARY ARTERY BYPASS GRAFT     1995  . CORONARY STENT PLACEMENT     Hx: of  . ENDARTERECTOMY Left 07/19/2013   Procedure: ENDARTERECTOMY CAROTID;  Surgeon: Mal Misty, MD;  Location: [facility];  Service: Vascular;  Laterality: Left;  . GALLBLADDER SURGERY  2004   . JOINT  REPLACEMENT     partial knee replacement on left 2002   . KNEE SURGERY    . ORIF PELVIC FRACTURE Left 08/25/2016   Procedure: OPEN REDUCTION INTERNAL FIXATION (ORIF) PELVIC FRACTURE; plate on front, SI screw on the back;  Surgeon: Altamese Ocoee, MD;  Location: [facility];  Service: Orthopedics;  Laterality: Left;  . OTHER SURGICAL HISTORY     left subclavian stenosis surgery PTA 08/2006   . OTHER SURGICAL HISTORY     carotid surgery on right 2004   . TOTAL KNEE ARTHROPLASTY  04/29/2011   Procedure: TOTAL KNEE ARTHROPLASTY;  Surgeon: Gearlean Alf;  Location: [facility];  Service: Orthopedics;  Laterality: Right;  . urinary retention    . VASCULAR SURGERY  AAA  . [place]  . VIDEO BRONCHOSCOPY WITH ENDOBRONCHIAL ULTRASOUND [name] 09/09/2016   Procedure: VIDEO BRONCHOSCOPY WITH ENDOBRONCHIAL ULTRASOUND;  Surgeon: Grace Isaac, MD;  Location: [facility];  Service: Thoracic;  Laterality: [name];    REVIEW OF SYSTEMS:  A comprehensive review of systems was negative.   PHYSICAL EXAMINATION: General appearance: alert, cooperative and no distress Head: Normocephalic, without obvious abnormality, atraumatic Neck: no adenopathy, no JVD, supple, symmetrical, trachea midline and thyroid not enlarged, symmetric, no tenderness/mass/nodules Lymph nodes: Cervical, supraclavicular, and axillary nodes normal. Resp: clear to auscultation bilaterally Back: symmetric, no curvature. ROM normal. No CVA tenderness. Cardio: regular rate and rhythm, S1, S2 normal, no murmur, click, rub or gallop GI: soft, non-tender; bowel sounds normal; no masses,  no organomegaly Extremities: extremities normal, atraumatic, no cyanosis or edema  ECOG PERFORMANCE STATUS: 1 - Symptomatic but completely ambulatory  Blood pressure (!) 121/54, pulse 69, temperature 97.6 F (36.4 C), temperature source Oral, resp. rate 18, height 5\' 7"  (1.702 m), weight 132 lb 8 oz (60.1 kg), SpO2 100 %.  LABORATORY DATA: Lab Results  Component  Value Date   WBC 5.1 08/16/2017   HGB 12.7 (L) 08/16/2017   HCT 37.0 (L) 08/16/2017   MCV 86.0 08/16/2017   PLT 114 (L) 08/16/2017      Chemistry      Component Value Date/Time   NA 136 08/16/2017 0813   NA 136 06/21/2017 1246   K 3.7 08/16/2017 0813   K 3.6 06/21/2017 1246   CL 104 08/16/2017 0813   CO2 24 08/16/2017 0813   CO2 25 06/21/2017 1246   BUN 11 08/16/2017 0813   BUN 14.0 06/21/2017 1246   CREATININE 0.86 08/16/2017 0813   CREATININE 0.8 06/21/2017 1246      Component Value Date/Time   CALCIUM 9.5 08/16/2017 0813   CALCIUM 9.4 06/21/2017 1246   ALKPHOS 130 08/16/2017 0813   ALKPHOS 139 06/21/2017 1246   AST 25 08/16/2017 0813   AST 18 06/21/2017 1246   ALT 21 08/16/2017 0813   ALT 17 06/21/2017 1246   BILITOT 0.5 08/16/2017 0813   BILITOT 0.41 06/21/2017 1246       RADIOGRAPHIC STUDIES: No results found.  ASSESSMENT AND PLAN:  This is a very pleasant 77 years old white male with a stage IIIa non-small cell lung cancer, adenocarcinoma with positive PDL 1 expression of 50%. The patient completed a course of concurrent chemoradiation with weekly carboplatin and paclitaxel status post 8 weeks and tolerated his treatment well except for mild dysphagia and odynophagia. He is currently undergoing consolidation immunotherapy with Imfinzi (Durvalumab) status post 15 cycles. He continues to tolerate his treatment with Imfinzi (Durvalumab) fairly well.  I recommended for the patient to continue his treatment and he will proceed with cycle #16 today. I will see him back for follow-up visit in 2 weeks for evaluation before the next cycle of his treatment. The patient was advised to call immediately if he has any concerning symptoms in the interval. The patient voices understanding of current disease status and treatment options and is in agreement with the current care plan. All questions were answered. The patient knows to call the clinic with any problems, questions  or concerns. We can certainly see the patient much sooner if necessary.  Disclaimer: This note was dictated with voice recognition software. Similar sounding words can inadvertently be transcribed and may not be corrected upon review.

## 2017-08-16 NOTE — Patient Instructions (Signed)
[facility] Discharge Instructions for Patients Receiving Chemotherapy  Today you received the following chemotherapy agents :  Durvalumab.  To help prevent nausea and vomiting after your treatment, we encourage you to take your nausea medication as prescribed.   If you develop nausea and vomiting that is not controlled by your nausea medication, call the clinic.   BELOW ARE SYMPTOMS THAT SHOULD BE REPORTED IMMEDIATELY:  *FEVER GREATER THAN 100.5 F  *CHILLS WITH OR WITHOUT FEVER  NAUSEA AND VOMITING THAT IS NOT CONTROLLED WITH YOUR NAUSEA MEDICATION  *UNUSUAL SHORTNESS OF BREATH  *UNUSUAL BRUISING OR BLEEDING  TENDERNESS IN MOUTH AND THROAT WITH OR WITHOUT PRESENCE OF ULCERS  *URINARY PROBLEMS  *BOWEL PROBLEMS  UNUSUAL RASH Items with * indicate a potential emergency and should be followed up as soon as possible.  Feel free to call the clinic should you have any questions or concerns. The clinic phone number is [phone].  Please show the [facility] at check-in to the Emergency Department and triage nurse.

## 2017-08-16 NOTE — Telephone Encounter (Signed)
3 cycles already scheduled per 2/27 los.

## 2017-08-17 ENCOUNTER — Encounter: Payer: Self-pay | Admitting: Family Medicine

## 2017-08-17 ENCOUNTER — Ambulatory Visit (INDEPENDENT_AMBULATORY_CARE_PROVIDER_SITE_OTHER): Payer: Medicare Other | Admitting: Family Medicine

## 2017-08-17 ENCOUNTER — Encounter (HOSPITAL_COMMUNITY): Payer: Medicare Other

## 2017-08-17 ENCOUNTER — Ambulatory Visit: Payer: Medicare Other | Admitting: Family

## 2017-08-17 VITALS — BP 120/72 | Ht 67.0 in | Wt 133.4 lb

## 2017-08-17 DIAGNOSIS — Z7901 Long term (current) use of anticoagulants: Secondary | ICD-10-CM | POA: Diagnosis not present

## 2017-08-17 DIAGNOSIS — E785 Hyperlipidemia, unspecified: Secondary | ICD-10-CM | POA: Diagnosis not present

## 2017-08-17 DIAGNOSIS — F0280 Dementia in other diseases classified elsewhere without behavioral disturbance: Secondary | ICD-10-CM

## 2017-08-17 DIAGNOSIS — G988 Other disorders of nervous system: Secondary | ICD-10-CM | POA: Diagnosis not present

## 2017-08-17 DIAGNOSIS — I10 Essential (primary) hypertension: Secondary | ICD-10-CM

## 2017-08-17 DIAGNOSIS — F0390 Unspecified dementia without behavioral disturbance: Secondary | ICD-10-CM

## 2017-08-17 LAB — POCT INR: INR: 2.5

## 2017-08-17 MED ORDER — LEVOTHYROXINE SODIUM 112 MCG PO TABS: 112.0000 ug | ORAL_TABLET | Freq: Every day | ORAL | 1 refills | Status: AC

## 2017-08-17 MED ORDER — POTASSIUM CHLORIDE CRYS ER 20 MEQ PO TBCR: 20.0000 meq | EXTENDED_RELEASE_TABLET | Freq: Every day | ORAL | 0 refills | Status: DC

## 2017-08-17 MED ORDER — DONEPEZIL HCL 5 MG PO TABS: 5.0000 mg | ORAL_TABLET | Freq: Every day | ORAL | 1 refills | Status: DC

## 2017-08-17 MED ORDER — LINACLOTIDE 145 MCG PO CAPS: 145.0000 ug | ORAL_CAPSULE | Freq: Every day | ORAL | 5 refills | Status: AC

## 2017-08-17 MED ORDER — METOPROLOL TARTRATE 25 MG PO TABS: ORAL_TABLET | ORAL | 5 refills | Status: DC

## 2017-08-17 MED ORDER — WARFARIN SODIUM 5 MG PO TABS: ORAL_TABLET | ORAL | 1 refills | Status: DC

## 2017-08-17 MED ORDER — ROSUVASTATIN CALCIUM 20 MG PO TABS: 20.0000 mg | ORAL_TABLET | Freq: Every day | ORAL | 5 refills | Status: DC

## 2017-08-17 NOTE — Progress Notes (Signed)
   Subjective:    Patient ID: [name] [name], male    DOB: [birth year], 77 y.o.   MRN: [id number]  Hypertension  This is a chronic problem. The current episode started more than 1 year ago. Risk factors for coronary artery disease include male gender and dyslipidemia. Treatments tried: lopressor. There are no compliance problems.    Blood pressure medicine and blood pressure levels reviewed today with patient. Compliant with blood pressure medicine. States does not miss a dose. No obvious side effects. Blood pressure generally good when checked elsewhere. Watching salt intake.  Patient continues to receive therapy for his lung cancer.  Overall handling the therapy reasonably well.  No significant side effects.  Due to have another scan later in the spring  Continues to take with blood thinning medicine.  Tries to be very compliant with her the importance of compliance discussed  Family reports overall mental status has stabilized.  Aricept seems to be helping.  Side effects and like to stick with     Review of Systems No headache, no major weight loss or weight gain, no chest pain no back pain abdominal pain no change in bowel habits complete ROS otherwise negative     Objective:   Physical Exam Alert and oriented, vitals reviewed and stable, NAD ENT-TM's and ext canals WNL bilat via otoscopic exam Soft palate, tonsils and post pharynx WNL via oropharyngeal exam Neck-symmetric, no masses; thyroid nonpalpable and nontender Pulmonary-no tachypnea or accessory muscle use; Clear without wheezes via auscultation Card--no abnrml murmurs, rhythm reg and rate WNL Carotid pulses symmetric, without bruits   Impression 1 hypertension good control discussed to maintain same       Assessment & Plan:    2.  Dementia.  Clinically stable.  Handle Aricept told to maintain  3.  History of DVT and paroxysmal atrial fibrillation and active carcinoma so ongoing anticoagulation.   Discussed.  4.  Lung carcinoma followed by specialist can be on spring  Medications refilled diet exercise discussed follow-up as scheduled

## 2017-08-30 ENCOUNTER — Encounter: Payer: Self-pay | Admitting: Internal Medicine

## 2017-08-30 ENCOUNTER — Inpatient Hospital Stay: Payer: Medicare Other

## 2017-08-30 ENCOUNTER — Inpatient Hospital Stay (HOSPITAL_BASED_OUTPATIENT_CLINIC_OR_DEPARTMENT_OTHER): Payer: Medicare Other | Admitting: Internal Medicine

## 2017-08-30 ENCOUNTER — Telehealth: Payer: Self-pay | Admitting: Oncology

## 2017-08-30 ENCOUNTER — Inpatient Hospital Stay: Payer: Medicare Other | Attending: Nurse Practitioner

## 2017-08-30 VITALS — BP 104/50 | HR 65 | Temp 97.6°F | Resp 18 | Ht 67.0 in | Wt 130.2 lb

## 2017-08-30 DIAGNOSIS — Z5112 Encounter for antineoplastic immunotherapy: Secondary | ICD-10-CM | POA: Insufficient documentation

## 2017-08-30 DIAGNOSIS — C3411 Malignant neoplasm of upper lobe, right bronchus or lung: Secondary | ICD-10-CM | POA: Insufficient documentation

## 2017-08-30 DIAGNOSIS — C342 Malignant neoplasm of middle lobe, bronchus or lung: Secondary | ICD-10-CM

## 2017-08-30 DIAGNOSIS — E039 Hypothyroidism, unspecified: Secondary | ICD-10-CM | POA: Insufficient documentation

## 2017-08-30 LAB — COMPREHENSIVE METABOLIC PANEL
ALBUMIN: 3.5 g/dL (ref 3.5–5.0)
ALT: 17 U/L (ref 0–55)
ANION GAP: 6 (ref 3–11)
AST: 21 U/L (ref 5–34)
Alkaline Phosphatase: 131 U/L (ref 40–150)
BILIRUBIN TOTAL: 0.6 mg/dL (ref 0.2–1.2)
BUN: 15 mg/dL (ref 7–26)
CO2: 26 mmol/L (ref 22–29)
Calcium: 9.8 mg/dL (ref 8.4–10.4)
Chloride: 104 mmol/L (ref 98–109)
Creatinine, Ser: 0.86 mg/dL (ref 0.70–1.30)
GFR calc Af Amer: >60 mL/min (ref >60)
GFR calc non Af Amer: >60 mL/min (ref >60)
GLUCOSE: 106 mg/dL (ref 70–140)
POTASSIUM: 4.2 mmol/L (ref 3.5–5.1)
SODIUM: 136 mmol/L (ref 136–145)
Total Protein: 6.7 g/dL (ref 6.4–8.3)

## 2017-08-30 LAB — CBC WITH DIFFERENTIAL/PLATELET
BASOS ABS: 0 10*3/uL (ref 0.0–0.1)
Basophils Relative: 0 %
Eosinophils Absolute: 0.1 10*3/uL (ref 0.0–0.5)
Eosinophils Relative: 1 %
HEMATOCRIT: 37.9 % — AB (ref 38.4–49.9)
HEMOGLOBIN: 13 g/dL (ref 13.0–17.1)
LYMPHS PCT: 16 %
Lymphs Abs: 1 10*3/uL (ref 0.9–3.3)
MCH: 29.6 pg (ref 27.2–33.4)
MCHC: 34.3 g/dL (ref 32.0–36.0)
MCV: 86.3 fL (ref 79.3–98.0)
MONO ABS: 0.3 10*3/uL (ref 0.1–0.9)
Monocytes Relative: 4 %
NEUTROS ABS: 4.8 10*3/uL (ref 1.5–6.5)
NEUTROS PCT: 79 %
Platelets: 125 10*3/uL — ABNORMAL LOW (ref 140–400)
RBC: 4.39 MIL/uL (ref 4.20–5.82)
RDW: 15.3 % — AB (ref 11.0–14.6)
WBC: 6.1 10*3/uL (ref 4.0–10.3)

## 2017-08-30 LAB — TSH: TSH: 0.463 u[IU]/mL (ref 0.320–4.118)

## 2017-08-30 MED ORDER — SODIUM CHLORIDE 0.9 % IV SOLN
Freq: Once | INTRAVENOUS | Status: AC
  Administered 2017-08-30: 11:00:00 via INTRAVENOUS

## 2017-08-30 MED ORDER — SODIUM CHLORIDE 0.9 % IV SOLN
10.0000 mg/kg | Freq: Once | INTRAVENOUS | Status: AC
  Administered 2017-08-30: 620 mg via INTRAVENOUS
  Filled 2017-08-30: qty 2.4

## 2017-08-30 NOTE — Telephone Encounter (Signed)
Scheduled appt per 3/13 los - patient to get an updated schedule next visit.

## 2017-08-30 NOTE — Progress Notes (Signed)
[facility] [phone]   Fax:(336) [phone]  OFFICE PROGRESS NOTE  [name], MD [address] [place] [address]  DIAGNOSIS: Stage IIIA (T1b, N2, M0) non-small cell lung cancer, adenocarcinoma with positive PDL 1 expression 50% diagnosed in March 2018 and presented with right upper lobe lung nodule in addition to mediastinal lymphadenopathy.  PRIOR THERAPY: Course of concurrent chemoradiation with weekly carboplatin for AUC of 2 and paclitaxel 45 MG/M2. First dose of chemotherapy 10/17/2016. Status post 8 cycles. Last dose was given 12/05/2016 with partial response.[name]  CURRENT THERAPY: Consolidation treatment with immunotherapy with Imfinzi (Durvalumab) 10 MG/KG every 2 weeks. First dose 01/18/2017. Status post 16 cycles.  INTERVAL HISTORY: [name] [name] 77 y.o. male returns to the clinic today for follow-up visit accompanied by his wife.  The patient is feeling fine with no specific complaints.  He continues to tolerate his treatment with Imfinzi (Durvalumab) fairly well.  He denied having any chest pain, shortness of breath, cough or hemoptysis.  He denied having any fever or chills.  He has no nausea, vomiting, diarrhea or constipation.  He lost 3 pounds since his last visit.  He is here today for evaluation before starting cycle #17.  MEDICAL HISTORY: Past Medical History:  Diagnosis Date  . Adenocarcinoma of right lung, stage 3 ([facility]) 09/20/2016  . Atrial fibrillation ([facility])    no hx of reported at preop visit of 04/21/11   . CAD (coronary artery disease)   . Carotid artery occlusion    left carotid endarterectomy  . Chronic kidney disease    AAA repair with reimplant of renals   . CHRONIC OBSTRUCTIVE PULMONARY DISEASE    pt denied at visit of 04/21/11   . CORONARY ARTERY DISEASE   . Dehydration 11/15/2016  . Disorder of left sacroiliac joint 08/24/2016  . DIVERTICULOSIS OF COLON   . Encounter for antineoplastic chemotherapy  10/17/2016  . GERD   . H/O hiatal hernia   . Headache(784.0)    Hx: of years of years  . HYPERTENSION   . HYPOTHYROIDISM   . JOINT EFFUSION, KNEE   . KNEE, ARTHRITIS, DEGEN./OSTEO    right knee   . Myocardial infarction ([facility])    1994   . NEPHROLITHIASIS, HX OF   . OSTEOPOROSIS   . Other dysphagia   . PERIPHERAL VASCULAR DISEASE    AAA - 1994 with reimplant of renals   . Peripheral vascular disease ([facility])    subclavian stenosis PTA - 3/08   . Pure hypercholesterolemia   . Renal artery stenosis ([facility])     ALLERGIES:  is allergic to neurontin [gabapentin]; imitrex [sumatriptan]; and lipitor [atorvastatin].  MEDICATIONS:  Current Outpatient Medications  Medication Sig Dispense Refill  . acetaminophen (TYLENOL) 325 MG tablet Take 325-650 mg by mouth every 6 (six) hours as needed for headache (for pain).     [name] ALPRAZolam (XANAX) 0.25 MG tablet Take 1 tablet (0.25 mg total) by mouth 3 (three) times daily as needed for anxiety. 30 tablet 0  . bumetanide (BUMEX) 1 MG tablet Take 1 tablet (1 mg total) by mouth daily as needed (swelling). 30 tablet 1  . donepezil (ARICEPT) 5 MG tablet Take 1 tablet (5 mg total) by mouth at bedtime. 90 tablet 1  . lansoprazole (PREVACID) 15 MG capsule Take 1 capsule (15 mg total) by mouth daily. 30 capsule 5  . levothyroxine (SYNTHROID, LEVOTHROID) 112 MCG tablet Take 1 tablet (112 mcg total)  by mouth daily. 90 tablet 1  . linaclotide (LINZESS) 145 MCG CAPS capsule Take 1 capsule (145 mcg total) by mouth daily before breakfast. 30 capsule 5  . magic mouthwash w/lidocaine SOLN Composition: 80 cc each of Extra St. Maalox Plus, Diphenhydramine, and Nystatin. Plus 240 cc of 2% viscous lidocaine to make 480 cc. Swish and swallow 2 tsp four times per day. (Patient not taking: Reported on 08/16/2017) 480 mL 2  . metoprolol tartrate (LOPRESSOR) 25 MG tablet TAKE ONE-HALF TABLET BY MOUTH TWO TIMES DAILY 30 tablet 5  . niacin (SLO-NIACIN) 500 MG tablet Take 3 tablets  (1,500 mg total) by mouth at bedtime. 30 tablet 0  . nitroGLYCERIN (NITROSTAT) 0.4 MG SL tablet Place 1 tablet (0.4 mg total) under the tongue every 5 (five) minutes as needed for chest pain. (Patient not taking: Reported on 07/05/2017) 25 tablet 10  . ondansetron (ZOFRAN) 8 MG tablet Take 1 tablet (8 mg total) by mouth every 8 (eight) hours as needed for nausea or vomiting. (Patient not taking: Reported on 05/10/2017) 20 tablet 0  . potassium chloride SA (K-DUR,KLOR-CON) 20 MEQ tablet Take 1 tablet (20 mEq total) by mouth daily. 90 tablet 0  . prochlorperazine (COMPAZINE) 10 MG tablet Take 1 tablet (10 mg total) by mouth every 6 (six) hours as needed for nausea or vomiting. (Patient not taking: Reported on 05/10/2017) 30 tablet 0  . rosuvastatin (CRESTOR) 20 MG tablet Take 1 tablet (20 mg total) by mouth daily. 30 tablet 5  . sucralfate (CARAFATE) 1 GM/10ML suspension Take 10 mLs (1 g total) by mouth 4 (four) times daily -  with meals and at bedtime. 420 mL 1  . tamsulosin (FLOMAX) 0.4 MG CAPS capsule Take 1 capsule (0.4 mg total) by mouth daily after breakfast. 30 capsule 1  . warfarin (COUMADIN) 5 MG tablet TAKE 1 TABLET DAILY. TAKE AS DIRECTED BYPHYSICIAN 90 tablet 1   No current facility-administered medications for this visit.     SURGICAL HISTORY:  Past Surgical History:  Procedure Laterality Date  . ABDOMINAL AORTIC ANEURYSM REPAIR     with reimplantation of renals   . CARDIAC CATHETERIZATION     2008  . CAROTID ENDARTERECTOMY Left Jan. 30, 2015   CEA  . CHOLECYSTECTOMY  2000   Gall Bladder  . COLONOSCOPY W/ BIOPSIES AND POLYPECTOMY     Hx: of  . CORONARY ARTERY BYPASS GRAFT     1995  . CORONARY STENT PLACEMENT     Hx: of  . ENDARTERECTOMY Left 07/19/2013   Procedure: ENDARTERECTOMY CAROTID;  Surgeon: Mal Misty, MD;  Location: [facility];  Service: Vascular;  Laterality: Left;  . GALLBLADDER SURGERY  2004   . JOINT REPLACEMENT     partial knee replacement on left 2002   .  KNEE SURGERY    . ORIF PELVIC FRACTURE Left 08/25/2016   Procedure: OPEN REDUCTION INTERNAL FIXATION (ORIF) PELVIC FRACTURE; plate on front, SI screw on the back;  Surgeon: Altamese Litchfield, MD;  Location: [facility];  Service: Orthopedics;  Laterality: Left;  . OTHER SURGICAL HISTORY     left subclavian stenosis surgery PTA 08/2006   . OTHER SURGICAL HISTORY     carotid surgery on right 2004   . TOTAL KNEE ARTHROPLASTY  04/29/2011   Procedure: TOTAL KNEE ARTHROPLASTY;  Surgeon: Gearlean Alf;  Location: [facility];  Service: Orthopedics;  Laterality: Right;  . urinary retention    . VASCULAR SURGERY     [facility]  .  VASECTOMY  1973  . VIDEO BRONCHOSCOPY WITH ENDOBRONCHIAL ULTRASOUND [name] 09/09/2016   Procedure: VIDEO BRONCHOSCOPY WITH ENDOBRONCHIAL ULTRASOUND;  Surgeon: Grace Isaac, MD;  Location: [facility];  Service: Thoracic;  Laterality: [name];    REVIEW OF SYSTEMS:  A comprehensive review of systems was negative except for: Constitutional: positive for weight loss   PHYSICAL EXAMINATION: General appearance: alert, cooperative and no distress Head: Normocephalic, without obvious abnormality, atraumatic Neck: no adenopathy, no JVD, supple, symmetrical, trachea midline and thyroid not enlarged, symmetric, no tenderness/mass/nodules Lymph nodes: Cervical, supraclavicular, and axillary nodes normal. Resp: clear to auscultation bilaterally Back: symmetric, no curvature. ROM normal. No CVA tenderness. Cardio: regular rate and rhythm, S1, S2 normal, no murmur, click, rub or gallop GI: soft, non-tender; bowel sounds normal; no masses,  no organomegaly Extremities: extremities normal, atraumatic, no cyanosis or edema  ECOG PERFORMANCE STATUS: 1 - Symptomatic but completely ambulatory  Blood pressure (!) 104/50, pulse 65, temperature 97.6 F (36.4 C), temperature source Oral, resp. rate 18, height 5\' 7"  (1.702 m), weight 130 lb 3.2 oz (59.1 kg), SpO2 100 %.  LABORATORY DATA: Lab Results  Component Value Date    WBC 6.1 08/30/2017   HGB 13.0 08/30/2017   HCT 37.9 (L) 08/30/2017   MCV 86.3 08/30/2017   PLT 125 (L) 08/30/2017      Chemistry      Component Value Date/Time   NA 136 08/16/2017 0813   NA 136 06/21/2017 1246   K 3.7 08/16/2017 0813   K 3.6 06/21/2017 1246   CL 104 08/16/2017 0813   CO2 24 08/16/2017 0813   CO2 25 06/21/2017 1246   BUN 11 08/16/2017 0813   BUN 14.0 06/21/2017 1246   CREATININE 0.86 08/16/2017 0813   CREATININE 0.8 06/21/2017 1246      Component Value Date/Time   CALCIUM 9.5 08/16/2017 0813   CALCIUM 9.4 06/21/2017 1246   ALKPHOS 130 08/16/2017 0813   ALKPHOS 139 06/21/2017 1246   AST 25 08/16/2017 0813   AST 18 06/21/2017 1246   ALT 21 08/16/2017 0813   ALT 17 06/21/2017 1246   BILITOT 0.5 08/16/2017 0813   BILITOT 0.41 06/21/2017 1246       RADIOGRAPHIC STUDIES: No results found.  ASSESSMENT AND PLAN:  This is a very pleasant 77 years old white male with a stage IIIa non-small cell lung cancer, adenocarcinoma with positive PDL 1 expression of 50%. The patient completed a course of concurrent chemoradiation with weekly carboplatin and paclitaxel status post 8 weeks and tolerated his treatment well except for mild dysphagia and odynophagia. He is currently undergoing consolidation immunotherapy with Imfinzi (Durvalumab) status post 16 cycles. The patient tolerated the last cycle of his treatment well with no concerning complaints. I recommended for him to proceed with cycle #17 today as a scheduled. I will see him back for follow-up visit in 2 weeks for evaluation before the next cycle of his treatment. He was advised to call immediately if he has any concerning symptoms in the interval. The patient voices understanding of current disease status and treatment options and is in agreement with the current care plan. All questions were answered. The patient knows to call the clinic with any problems, questions or concerns. We can certainly see the  patient much sooner if necessary.  Disclaimer: This note was dictated with voice recognition software. Similar sounding words can inadvertently be transcribed and may not be corrected upon review.

## 2017-08-30 NOTE — Patient Instructions (Signed)
[facility] Discharge Instructions for Patients Receiving Chemotherapy  Today you received the following chemotherapy agents: Imfinzi.  To help prevent nausea and vomiting after your treatment, we encourage you to take your nausea medication as directed.   If you develop nausea and vomiting that is not controlled by your nausea medication, call the clinic.   BELOW ARE SYMPTOMS THAT SHOULD BE REPORTED IMMEDIATELY:  *FEVER GREATER THAN 100.5 F  *CHILLS WITH OR WITHOUT FEVER  NAUSEA AND VOMITING THAT IS NOT CONTROLLED WITH YOUR NAUSEA MEDICATION  *UNUSUAL SHORTNESS OF BREATH  *UNUSUAL BRUISING OR BLEEDING  TENDERNESS IN MOUTH AND THROAT WITH OR WITHOUT PRESENCE OF ULCERS  *URINARY PROBLEMS  *BOWEL PROBLEMS  UNUSUAL RASH Items with * indicate a potential emergency and should be followed up as soon as possible.  Feel free to call the clinic should you have any questions or concerns. The clinic phone number is [phone].  Please show the CHEMO ALERT CARD at check-in to the Emergency Department and triage nurse.   

## 2017-09-06 ENCOUNTER — Other Ambulatory Visit: Payer: Self-pay | Admitting: *Deleted

## 2017-09-07 ENCOUNTER — Other Ambulatory Visit: Payer: Self-pay | Admitting: Family Medicine

## 2017-09-07 MED ORDER — ROSUVASTATIN CALCIUM 20 MG PO TABS: 20.0000 mg | ORAL_TABLET | Freq: Every day | ORAL | 5 refills | Status: DC

## 2017-09-13 ENCOUNTER — Inpatient Hospital Stay (HOSPITAL_BASED_OUTPATIENT_CLINIC_OR_DEPARTMENT_OTHER): Payer: Medicare Other | Admitting: Oncology

## 2017-09-13 ENCOUNTER — Encounter: Payer: Self-pay | Admitting: Oncology

## 2017-09-13 ENCOUNTER — Inpatient Hospital Stay: Payer: Medicare Other

## 2017-09-13 ENCOUNTER — Telehealth: Payer: Self-pay | Admitting: Oncology

## 2017-09-13 VITALS — BP 109/59 | HR 59 | Temp 97.6°F | Resp 18 | Ht 67.0 in | Wt 132.8 lb

## 2017-09-13 DIAGNOSIS — Z5112 Encounter for antineoplastic immunotherapy: Secondary | ICD-10-CM

## 2017-09-13 DIAGNOSIS — C342 Malignant neoplasm of middle lobe, bronchus or lung: Secondary | ICD-10-CM

## 2017-09-13 DIAGNOSIS — C3411 Malignant neoplasm of upper lobe, right bronchus or lung: Secondary | ICD-10-CM | POA: Diagnosis not present

## 2017-09-13 DIAGNOSIS — E039 Hypothyroidism, unspecified: Secondary | ICD-10-CM | POA: Diagnosis not present

## 2017-09-13 LAB — CBC WITH DIFFERENTIAL/PLATELET
BASOS ABS: 0 10*3/uL (ref 0.0–0.1)
BASOS PCT: 0 %
EOS ABS: 0.1 10*3/uL (ref 0.0–0.5)
EOS PCT: 2 %
HCT: 37.3 % — ABNORMAL LOW (ref 38.4–49.9)
Hemoglobin: 12.8 g/dL — ABNORMAL LOW (ref 13.0–17.1)
Lymphocytes Relative: 20 %
Lymphs Abs: 1 10*3/uL (ref 0.9–3.3)
MCH: 29.6 pg (ref 27.2–33.4)
MCHC: 34.3 g/dL (ref 32.0–36.0)
MCV: 86.3 fL (ref 79.3–98.0)
MONO ABS: 0.4 10*3/uL (ref 0.1–0.9)
Monocytes Relative: 8 %
Neutro Abs: 3.7 10*3/uL (ref 1.5–6.5)
Neutrophils Relative %: 70 %
PLATELETS: 118 10*3/uL — AB (ref 140–400)
RBC: 4.32 MIL/uL (ref 4.20–5.82)
RDW: 15.4 % — AB (ref 11.0–14.6)
WBC: 5.2 10*3/uL (ref 4.0–10.3)

## 2017-09-13 LAB — COMPREHENSIVE METABOLIC PANEL
ALT: 18 U/L (ref 0–55)
AST: 22 U/L (ref 5–34)
Albumin: 3.5 g/dL (ref 3.5–5.0)
Alkaline Phosphatase: 127 U/L (ref 40–150)
Anion gap: 8 (ref 3–11)
BUN: 17 mg/dL (ref 7–26)
CHLORIDE: 105 mmol/L (ref 98–109)
CO2: 25 mmol/L (ref 22–29)
Calcium: 9.8 mg/dL (ref 8.4–10.4)
Creatinine, Ser: 0.83 mg/dL (ref 0.70–1.30)
GFR calc Af Amer: >60 mL/min (ref >60)
Glucose, Bld: 99 mg/dL (ref 70–140)
POTASSIUM: 4 mmol/L (ref 3.5–5.1)
Sodium: 138 mmol/L (ref 136–145)
Total Bilirubin: 0.5 mg/dL (ref 0.2–1.2)
Total Protein: 6.7 g/dL (ref 6.4–8.3)

## 2017-09-13 MED ORDER — SODIUM CHLORIDE 0.9 % IV SOLN
Freq: Once | INTRAVENOUS | Status: AC
  Administered 2017-09-13: 11:00:00 via INTRAVENOUS

## 2017-09-13 MED ORDER — SODIUM CHLORIDE 0.9 % IV SOLN
10.0000 mg/kg | Freq: Once | INTRAVENOUS | Status: AC
  Administered 2017-09-13: 620 mg via INTRAVENOUS
  Filled 2017-09-13: qty 2.4

## 2017-09-13 NOTE — Assessment & Plan Note (Signed)
This is a very pleasant 77 year old white male with a stage IIIa non-small cell lung cancer, adenocarcinoma with positive PDL 1 expression of 50%. The patient completed a course of concurrent chemoradiation with weekly carboplatin and paclitaxel status post 8 weeks and tolerated his treatment well except for mild dysphagia and odynophagia. He is currently undergoing consolidation immunotherapy with Imfinzi (Durvalumab) status post 17 cycles. The patient tolerated the last cycle of his treatment well with no concerning complaints. I recommended for him to proceed with cycle #18 today as a scheduled. The patient will have a restaging CT scan of the chest prior to his next visit. He will follow-up in 2 weeks for evaluation prior to his next cycle of treatment and to review his restaging CT scan of the chest.  He was advised to call immediately if he has any concerning symptoms in the interval. The patient voices understanding of current disease status and treatment options and is in agreement with the current care plan. All questions were answered. The patient knows to call the clinic with any problems, questions or concerns. We can certainly see the patient much sooner if necessary.

## 2017-09-13 NOTE — Telephone Encounter (Signed)
Scheduled appt per 3/27 los - patient to get an updated schedule next visit.

## 2017-09-13 NOTE — Progress Notes (Addendum)
[facility] OFFICE PROGRESS NOTE  [name], MD [address] [place] [address]  DIAGNOSIS: Stage IIIA (T1b, N2, M0) non-small cell lung cancer, adenocarcinoma with positive PDL 1 expression 50% diagnosed in March 2018 and presented with right upper lobe lung nodule in addition to mediastinal lymphadenopathy.  PRIOR THERAPY:Course of concurrent chemoradiation with weekly carboplatin for AUC of 2 and paclitaxel 45 MG/M2. First dose of chemotherapy 10/17/2016. Status post 8 cycles. Last dose was given 12/05/2016 with partial response.  CURRENT THERAPY: Consolidation treatment with immunotherapy with Imfinzi (Durvalumab) 10 MG/KG every 2 weeks. First dose 01/18/2017. Status post 17 cycles.  INTERVAL HISTORY: Ronon Ferger Hinote 77 y.o. male returns for routine follow-up visit accompanied by his wife.  The patient is feeling fine today with no specific complaints.  He continues to tolerate treatment with Imfinzi fairly well.  He denies fevers and chills.  Denies chest pain, shortness breath, cough, hemoptysis.  Denies nausea, vomiting, constipation, diarrhea.  He has gained back 2 pounds since his last visit.  The patient is here for evaluation prior to cycle #18 of his treatment.  MEDICAL HISTORY: Past Medical History:  Diagnosis Date  . Adenocarcinoma of right lung, stage 3 ([facility]) 09/20/2016  . Atrial fibrillation ([facility])    no hx of reported at preop visit of 04/21/11   . CAD (coronary artery disease)   . Carotid artery occlusion    left carotid endarterectomy  . Chronic kidney disease    AAA repair with reimplant of renals   . CHRONIC OBSTRUCTIVE PULMONARY DISEASE    pt denied at visit of 04/21/11   . CORONARY ARTERY DISEASE   . Dehydration 11/15/2016  . Disorder of left sacroiliac joint 08/24/2016  . DIVERTICULOSIS OF COLON   . Encounter for antineoplastic chemotherapy 10/17/2016  . GERD   . H/O hiatal hernia   . Headache(784.0)    Hx: of years of years  .  HYPERTENSION   . HYPOTHYROIDISM   . JOINT EFFUSION, KNEE   . KNEE, ARTHRITIS, DEGEN./OSTEO    right knee   . Myocardial infarction ([facility])    1994   . NEPHROLITHIASIS, HX OF   . OSTEOPOROSIS   . Other dysphagia   . PERIPHERAL VASCULAR DISEASE    AAA - 1994 with reimplant of renals   . Peripheral vascular disease ([facility])    subclavian stenosis PTA - 3/08   . Pure hypercholesterolemia   . Renal artery stenosis ([facility])     ALLERGIES:  is allergic to neurontin [gabapentin]; imitrex [sumatriptan]; and lipitor [atorvastatin].  MEDICATIONS:  Current Outpatient Medications  Medication Sig Dispense Refill  . acetaminophen (TYLENOL) 325 MG tablet Take 325-650 mg by mouth every 6 (six) hours as needed for headache (for pain).     [name] ALPRAZolam (XANAX) 0.25 MG tablet Take 1 tablet (0.25 mg total) by mouth 3 (three) times daily as needed for anxiety. 30 tablet 0  . bumetanide (BUMEX) 1 MG tablet Take 1 tablet (1 mg total) by mouth daily as needed (swelling). 30 tablet 1  . donepezil (ARICEPT) 5 MG tablet Take 1 tablet (5 mg total) by mouth at bedtime. 90 tablet 1  . lansoprazole (PREVACID) 15 MG capsule Take 1 capsule (15 mg total) by mouth daily. 30 capsule 5  . levothyroxine (SYNTHROID, LEVOTHROID) 112 MCG tablet Take 1 tablet (112 mcg total) by mouth daily. 90 tablet 1  . linaclotide (LINZESS) 145 MCG CAPS capsule Take 1 capsule (145 mcg total) by  mouth daily before breakfast. 30 capsule 5  . magic mouthwash w/lidocaine SOLN Composition: 80 cc each of Extra St. Maalox Plus, Diphenhydramine, and Nystatin. Plus 240 cc of 2% viscous lidocaine to make 480 cc. Swish and swallow 2 tsp four times per day. 480 mL 2  . metoprolol tartrate (LOPRESSOR) 25 MG tablet TAKE ONE-HALF TABLET BY MOUTH TWO TIMES DAILY 30 tablet 5  . niacin (SLO-NIACIN) 500 MG tablet Take 3 tablets (1,500 mg total) by mouth at bedtime. 30 tablet 0  . nitroGLYCERIN (NITROSTAT) 0.4 MG SL tablet Place 1 tablet (0.4 mg total) under the  tongue every 5 (five) minutes as needed for chest pain. 25 tablet 10  . ondansetron (ZOFRAN) 8 MG tablet Take 1 tablet (8 mg total) by mouth every 8 (eight) hours as needed for nausea or vomiting. 20 tablet 0  . potassium chloride SA (K-DUR,KLOR-CON) 20 MEQ tablet Take 1 tablet (20 mEq total) by mouth daily. 90 tablet 0  . prochlorperazine (COMPAZINE) 10 MG tablet Take 1 tablet (10 mg total) by mouth every 6 (six) hours as needed for nausea or vomiting. 30 tablet 0  . rosuvastatin (CRESTOR) 20 MG tablet Take 1 tablet (20 mg total) by mouth daily. 30 tablet 5  . sucralfate (CARAFATE) 1 GM/10ML suspension Take 10 mLs (1 g total) by mouth 4 (four) times daily -  with meals and at bedtime. 420 mL 1  . tamsulosin (FLOMAX) 0.4 MG CAPS capsule TAKE ONE CAPSULE BY MOUTH TWICE A DAY. 60 capsule 11  . warfarin (COUMADIN) 5 MG tablet TAKE 1 TABLET DAILY. TAKE AS DIRECTED BYPHYSICIAN 90 tablet 1   No current facility-administered medications for this visit.     SURGICAL HISTORY:  Past Surgical History:  Procedure Laterality Date  . ABDOMINAL AORTIC ANEURYSM REPAIR     with reimplantation of renals   . CARDIAC CATHETERIZATION     2008  . CAROTID ENDARTERECTOMY Left Jan. 30, 2015   CEA  . CHOLECYSTECTOMY  2000   Gall Bladder  . COLONOSCOPY W/ BIOPSIES AND POLYPECTOMY     Hx: of  . CORONARY ARTERY BYPASS GRAFT     1995  . CORONARY STENT PLACEMENT     Hx: of  . ENDARTERECTOMY Left 07/19/2013   Procedure: ENDARTERECTOMY CAROTID;  Surgeon: Mal Misty, MD;  Location: [facility];  Service: Vascular;  Laterality: Left;  . GALLBLADDER SURGERY  2004   . JOINT REPLACEMENT     partial knee replacement on left 2002   . KNEE SURGERY    . ORIF PELVIC FRACTURE Left 08/25/2016   Procedure: OPEN REDUCTION INTERNAL FIXATION (ORIF) PELVIC FRACTURE; plate on front, SI screw on the back;  Surgeon: Altamese Fair Grove, MD;  Location: [facility];  Service: Orthopedics;  Laterality: Left;  . OTHER SURGICAL HISTORY     left  subclavian stenosis surgery PTA 08/2006   . OTHER SURGICAL HISTORY     carotid surgery on right 2004   . TOTAL KNEE ARTHROPLASTY  04/29/2011   Procedure: TOTAL KNEE ARTHROPLASTY;  Surgeon: Gearlean Alf;  Location: [facility];  Service: Orthopedics;  Laterality: Right;  . urinary retention    . VASCULAR SURGERY     AAA  . VASECTOMY  1973  . VIDEO BRONCHOSCOPY WITH ENDOBRONCHIAL ULTRASOUND [name] 09/09/2016   Procedure: VIDEO BRONCHOSCOPY WITH ENDOBRONCHIAL ULTRASOUND;  Surgeon: Grace Isaac, MD;  Location: [facility];  Service: Thoracic;  Laterality: [name];    REVIEW OF SYSTEMS:   Review of Systems  Constitutional: Negative for appetite change, chills, fatigue, fever and unexpected weight change.  HENT:   Negative for mouth sores, nosebleeds, sore throat and trouble swallowing.   Eyes: Negative for eye problems and icterus.  Respiratory: Negative for cough, hemoptysis, shortness of breath and wheezing.   Cardiovascular: Negative for chest pain and leg swelling.  Gastrointestinal: Negative for abdominal pain, constipation, diarrhea, nausea and vomiting.  Genitourinary: Negative for bladder incontinence, difficulty urinating, dysuria, frequency and hematuria.   Musculoskeletal: Negative for back pain, gait problem, neck pain and neck stiffness.  Skin: Negative for itching and rash.  Neurological: Negative for dizziness, extremity weakness, gait problem, headaches, light-headedness and seizures.  Hematological: Negative for adenopathy. Does not bruise/bleed easily.  Psychiatric/Behavioral: Negative for confusion, depression and sleep disturbance. The patient is not nervous/anxious.     PHYSICAL EXAMINATION:  Blood pressure (!) 109/59, pulse (!) 59, temperature 97.6 F (36.4 C), temperature source Oral, resp. rate 18, height 5\' 7"  (1.702 m), weight 132 lb 12.8 oz (60.2 kg), SpO2 100 %.  ECOG PERFORMANCE STATUS: 1 - Symptomatic but completely ambulatory  Physical Exam  Constitutional: Oriented  to person, place, and time and well-developed, well-nourished, and in no distress. No distress.  HENT:  Head: Normocephalic and atraumatic.  Mouth/Throat: Oropharynx is clear and moist. No oropharyngeal exudate.  Eyes: Conjunctivae are normal. Right eye exhibits no discharge. Left eye exhibits no discharge. No scleral icterus.  Neck: Normal range of motion. Neck supple.  Cardiovascular: Normal rate, regular rhythm, normal heart sounds and intact distal pulses.   Pulmonary/Chest: Effort normal and breath sounds normal. No respiratory distress. No wheezes. No rales.  Abdominal: Soft. Bowel sounds are normal. Exhibits no distension and no mass. There is no tenderness.  Musculoskeletal: Normal range of motion. Exhibits no edema.  Lymphadenopathy:    No cervical adenopathy.  Neurological: Alert and oriented to person, place, and time. Exhibits normal muscle tone. Gait normal. Coordination normal.  Skin: Skin is warm and dry. No rash noted. Not diaphoretic. No erythema. No pallor.  Psychiatric: Mood, memory and judgment normal.  Vitals reviewed.  LABORATORY DATA: Lab Results  Component Value Date   WBC 5.2 09/13/2017   HGB 12.8 (L) 09/13/2017   HCT 37.3 (L) 09/13/2017   MCV 86.3 09/13/2017   PLT 118 (L) 09/13/2017      Chemistry      Component Value Date/Time   NA 138 09/13/2017 0841   NA 136 06/21/2017 1246   K 4.0 09/13/2017 0841   K 3.6 06/21/2017 1246   CL 105 09/13/2017 0841   CO2 25 09/13/2017 0841   CO2 25 06/21/2017 1246   BUN 17 09/13/2017 0841   BUN 14.0 06/21/2017 1246   CREATININE 0.83 09/13/2017 0841   CREATININE 0.8 06/21/2017 1246      Component Value Date/Time   CALCIUM 9.8 09/13/2017 0841   CALCIUM 9.4 06/21/2017 1246   ALKPHOS 127 09/13/2017 0841   ALKPHOS 139 06/21/2017 1246   AST 22 09/13/2017 0841   AST 18 06/21/2017 1246   ALT 18 09/13/2017 0841   ALT 17 06/21/2017 1246   BILITOT 0.5 09/13/2017 0841   BILITOT 0.41 06/21/2017 1246        RADIOGRAPHIC STUDIES:  No results found.   ASSESSMENT/PLAN:  T1b N2 M0 disease (stage IIIA).adenocarcinoma of the right middle lobe of lung [facility]) This is a very pleasant 77 year old white male with a stage IIIa non-small cell lung cancer, adenocarcinoma with positive PDL 1 expression of 50%. The  patient completed a course of concurrent chemoradiation with weekly carboplatin and paclitaxel status post 8 weeks and tolerated his treatment well except for mild dysphagia and odynophagia. He is currently undergoing consolidation immunotherapy with Imfinzi (Durvalumab) status post 17 cycles. The patient tolerated the last cycle of his treatment well with no concerning complaints. I recommended for him to proceed with cycle #18 today as a scheduled. The patient will have a restaging CT scan of the chest prior to his next visit. He will follow-up in 2 weeks for evaluation prior to his next cycle of treatment and to review his restaging CT scan of the chest.  He was advised to call immediately if he has any concerning symptoms in the interval. The patient voices understanding of current disease status and treatment options and is in agreement with the current care plan. All questions were answered. The patient knows to call the clinic with any problems, questions or concerns. We can certainly see the patient much sooner if necessary.   Orders Placed This Encounter  Procedures  . CT CHEST W CONTRAST    Standing Status:   Future    Standing Expiration Date:   09/14/2018    Order Specific Question:   If indicated for the ordered procedure, I authorize the administration of contrast media per Radiology protocol    Answer:   Yes    Order Specific Question:   Preferred imaging location?    Answer:   [facility]    Order Specific Question:   Radiology Contrast Protocol - do NOT remove file path    Answer:   [email or web address]    Order Specific Question:   Reason  for Exam additional comments    Answer:   Lung cancer. Restaging.   Mikey Bussing, DNP, AGPCNP-BC, AOCNP 09/13/17  09/13/2017 12:30 PM  I was asked to see the patient in the infusion room after he had completed his infusion of Imfinzi followed by saline.  He was noted to have swelling in his right anterior forearm proximal to his IV.  This was noted at the end of his infusion.  The patient denied any pain.  There was no erythema.  There was only soft swelling immediately proximal to the IV site in the right forearm.  There was no skin breakdown or increased warmth.  Pharmacy was asked their input.  They stated that there was no data to show concern for an extravasation of Imfinzi if this were the case.  The patient was instructed to use warm compresses to the area and to follow-up immediately should he have any changes at the site.    Sandi Mealy, MHS, PA-C Physician Assistant

## 2017-09-13 NOTE — Progress Notes (Signed)
Post Imfinzi infusion and flush, this RN went to take out pt Periferal IV and noticed swelling above IV site.  Pharmacy notified and Sandi Mealy Pa notified to come and assess pt.  Pt denies pain in infiltration area.    Per pharmacist Jaclyn Shaggy, no intervention needed to treat IV infiltration from [place].  Pt educated to watch for signs of infection and to call the cancer center with any concerns.  Pt states understanding.

## 2017-09-13 NOTE — Patient Instructions (Signed)
[facility] Discharge Instructions for Patients Receiving Chemotherapy  Today you received the following chemotherapy agents imfinzi  To help prevent nausea and vomiting after your treatment, we encourage you to take your nausea medication as directed If you develop nausea and vomiting that is not controlled by your nausea medication, call the clinic.   BELOW ARE SYMPTOMS THAT SHOULD BE REPORTED IMMEDIATELY:  *FEVER GREATER THAN 100.5 F  *CHILLS WITH OR WITHOUT FEVER  NAUSEA AND VOMITING THAT IS NOT CONTROLLED WITH YOUR NAUSEA MEDICATION  *UNUSUAL SHORTNESS OF BREATH  *UNUSUAL BRUISING OR BLEEDING  TENDERNESS IN MOUTH AND THROAT WITH OR WITHOUT PRESENCE OF ULCERS  *URINARY PROBLEMS  *BOWEL PROBLEMS  UNUSUAL RASH Items with * indicate a potential emergency and should be followed up as soon as possible.  Feel free to call the clinic should you have any questions or concerns. The clinic phone number is [phone].  Please show the [facility] at check-in to the Emergency Department and triage nurse.

## 2017-09-14 ENCOUNTER — Telehealth: Payer: Self-pay | Admitting: Emergency Medicine

## 2017-09-14 ENCOUNTER — Ambulatory Visit (INDEPENDENT_AMBULATORY_CARE_PROVIDER_SITE_OTHER): Payer: Medicare Other

## 2017-09-14 DIAGNOSIS — Z7901 Long term (current) use of anticoagulants: Secondary | ICD-10-CM

## 2017-09-14 LAB — POCT INR: INR: 2.2

## 2017-09-14 NOTE — Patient Instructions (Signed)
Continue the sam Coumadin 5 mg one tablet all days except one and half on Sun,Tues,Fri.

## 2017-09-14 NOTE — Telephone Encounter (Signed)
CT scan scheduled for 4/9 @ 1:30pm. NPO 4 hr prior. pts wife verbalized understanding of information.

## 2017-09-18 ENCOUNTER — Encounter: Payer: Self-pay | Admitting: Neurology

## 2017-09-18 ENCOUNTER — Other Ambulatory Visit: Payer: Self-pay

## 2017-09-18 ENCOUNTER — Ambulatory Visit (INDEPENDENT_AMBULATORY_CARE_PROVIDER_SITE_OTHER): Payer: Medicare Other | Admitting: Neurology

## 2017-09-18 VITALS — BP 120/66 | HR 67 | Ht 67.0 in | Wt 125.0 lb

## 2017-09-18 DIAGNOSIS — F0390 Unspecified dementia without behavioral disturbance: Secondary | ICD-10-CM

## 2017-09-18 DIAGNOSIS — I6529 Occlusion and stenosis of unspecified carotid artery: Secondary | ICD-10-CM | POA: Diagnosis not present

## 2017-09-18 DIAGNOSIS — I63031 Cerebral infarction due to thrombosis of right carotid artery: Secondary | ICD-10-CM | POA: Diagnosis not present

## 2017-09-18 DIAGNOSIS — F03A0 Unspecified dementia, mild, without behavioral disturbance, psychotic disturbance, mood disturbance, and anxiety: Secondary | ICD-10-CM

## 2017-09-18 NOTE — Patient Instructions (Addendum)
1. Continue Donepezil 5mg  daily 2. Continue all your medications 3. For any sudden change in symptoms, go to ER immediately 4. Follow-up with Dr. Donnetta Hutching as scheduled 5. Follow-up in 6 months, call for any changes  FALL PRECAUTIONS: Be cautious when walking. Scan the area for obstacles that may increase the risk of trips and falls. When getting up in the mornings, sit up at the edge of the bed for a few minutes before getting out of bed. Consider elevating the bed at the head end to avoid drop of blood pressure when getting up. Walk always in a well-lit room (use night lights in the walls). Avoid area rugs or power cords from appliances in the middle of the walkways. Use a walker or a cane if necessary and consider physical therapy for balance exercise. Get your eyesight checked regularly.  FINANCIAL OVERSIGHT: Supervision, especially oversight when making financial decisions or transactions is also recommended.  HOME SAFETY: Consider the safety of the kitchen when operating appliances like stoves, microwave oven, and blender. Consider having supervision and share cooking responsibilities until no longer able to participate in those. Accidents with firearms and other hazards in the house should be identified and addressed as well.  DRIVING: Regarding driving, in patients with progressive memory problems, driving will be impaired. We advise to have someone else do the driving if trouble finding directions or if minor accidents are reported. Independent driving assessment is available to determine safety of driving.  ABILITY TO BE LEFT ALONE: If patient is unable to contact 911 operator, consider using LifeLine, or when the need is there, arrange for someone to stay with patients. Smoking is a fire hazard, consider supervision or cessation. Risk of wandering should be assessed by caregiver and if detected at any point, supervision and safe proof recommendations should be instituted.  MEDICATION  SUPERVISION: Inability to self-administer medication needs to be constantly addressed. Implement a mechanism to ensure safe administration of the medications.  RECOMMENDATIONS FOR ALL PATIENTS WITH MEMORY PROBLEMS: 1. Continue to exercise (Recommend 30 minutes of walking everyday, or 3 hours every week) 2. Increase social interactions - continue going to [place] and enjoy social gatherings with friends and family 3. Eat healthy, avoid fried foods and eat more fruits and vegetables 4. Maintain adequate blood pressure, blood sugar, and blood cholesterol level. Reducing the risk of stroke and cardiovascular disease also helps promoting better memory. 5. Avoid stressful situations. Live a simple life and avoid aggravations. Organize your time and prepare for the next day in anticipation. 6. Sleep well, avoid any interruptions of sleep and avoid any distractions in the bedroom that may interfere with adequate sleep quality 7. Avoid sugar, avoid sweets as there is a strong link between excessive sugar intake, diabetes, and cognitive impairment The Mediterranean diet has been shown to help patients reduce the risk of progressive memory disorders and reduces cardiovascular risk. This includes eating fish, eat fruits and green leafy vegetables, nuts like almonds and hazelnuts, walnuts, and also use olive oil. Avoid fast foods and fried foods as much as possible. Avoid sweets and sugar as sugar use has been linked to worsening of memory function.

## 2017-09-18 NOTE — Progress Notes (Signed)
NEUROLOGY FOLLOW UP OFFICE NOTE  [name] [name] [id number] 03-21-1941  HISTORY OF PRESENT ILLNESS: I had the pleasure of seeing [name] [name] in follow-up in the neurology clinic on 03/20/2017.  The patient was last seen 3 months ago for worsening memory after bilateral strokes. He is again accompanied by his wife who helps supplement the history today. He fell off a 20-foot ladder last March 2018 sustaining pelvic and transverse process fractures. As part of his evaluation, he had a CT C/A/P which showed pulmonary densities with biopsy consistent with adenocarcinoma. He underwent chemotherapy and radiation, and continues on immunotherapy. During his hospitalization, his brain imaging showed bilateral strokes, with evolving left frontal subacute to chronic hemorrhagic infarct or cortical contusion, and watershed infarcts in the right hemisphere concerning for proximal high grade carotid stenosis. CTA neck showed 80% stenosis of the distal right common carotid artery with irregular plaque with ulceration. He has multiple medical issues and is not an ideal surgical candidate at this time, he undergoes close clinical monitoring with Dr. Donnetta Hutching and is scheduled for repeat carotid dopplers at the end of the month. He and his wife report symptoms have been stable, his wife feels his memory is better. She administers his medications. He denies any vision loss, no focal numbness/tingling/weakness, headaches, dizziness, no falls. He is taking Donepezil 5mg  daily. Last MMSE in July 2018 was 20/30.  HPI 12/20/2016: This is a pleasant 77 yo RH man with a history of CAD s/p CABG in 1995, abdominal aortic aneurysm, carotid stenosis s/p bilateral CEA, diverticulosis, atrial fibrillation, hypertension, hiatal hernia, hypothyroidism, osteoporosis, dyslipidemia, renal artery stenosis and long history of smoking but quit in 1994. He was [facility] to [facility] 08/22/2016 after a fall off a 20-feet ladder. He sustained a pelvic  fracture and transverse process fractrues L2-S1 and underwent surgery. As part of his evaluation, he had a CT chest/abdomen/pelvis which showed pulmonary densities. Bronchoscopy was done with biopsy showing malignant cells consistent with adenocarcinoma. He developed a DVT and was started on Coumadin. His head CT without contrast showed abnormal anterior left frontal lobe with a suspicious for 2 cm brain mass with mild surrounding vasogenic edema. Follow-up brain MRI done 09/02/2016 showed a left frontal lesion with T1 shortening and susceptibility blooming without enhancement, favoring subacute to chronic hemorrhagic infarct or cortical contusion. There were small foci of restricted diffusion in the right frontal and parietal lobes in a watershed distribution consistent with acute/early subacute infarcts. Sequelae of trauma with shear injury was also possible, but no hemorrhage was seen. There was a small left cerebral convexity subdural hematoma without mass effect. A repeat MRI brain with and without contrast done 10/15/16 showed new areas of acute ischemia in the right hemisphere not seen on previous scan, consistent with ongoing hypoperfusion and multifocal watershed infarcts. The changes on the left frontal region did not show features of metastatic disease, more consistent with evolution of hemorrhagic infarct. There was concern for proximal high-grade stenosis, consider CTA head and neck for further evaluation.  Since his hospital stay, he has finished chemotherapy and radiation therapy. He developed urinary retention and has a foley catheter with follow-up with Urology. He has not seen Neurology until today, and his wife reports confusion where he would not remember things he had always done, asking a lot of questions because he could not understand what she was trying to tell him. He could not figure out their remote control of 10 years, he states he has trouble seeing the little  numbers. He had  previously been in charge of bills, his wife took after after hospitalization. She manages his medications, he has always been on multiple medications in the past, but is having a hard time managing them now. He had been driving prior to the fall without getting lost, but is not driving at this time. His wife helps him to the shower but he can bathe and dress himself. She has noticed more irritability, no paranoia. He was having "crazy things and imagining things" on gabapentin, resolved with discontinuation of medication. His mother and 3 sisters had Alzheimer's disease. His mother had a history of stroke.  PAST MEDICAL HISTORY: Past Medical History:  Diagnosis Date  . Adenocarcinoma of right lung, stage 3 ([place]) 09/20/2016  . Atrial fibrillation ([place])    no hx of reported at preop visit of 04/21/11   . CAD (coronary artery disease)   . Carotid artery occlusion    left carotid endarterectomy  . Chronic kidney disease    AAA repair with reimplant of renals   . CHRONIC OBSTRUCTIVE PULMONARY DISEASE    pt denied at visit of 04/21/11   . CORONARY ARTERY DISEASE   . Dehydration 11/15/2016  . Disorder of left sacroiliac joint 08/24/2016  . DIVERTICULOSIS OF COLON   . Encounter for antineoplastic chemotherapy 10/17/2016  . GERD   . H/O hiatal hernia   . Headache(784.0)    Hx: of years of years  . HYPERTENSION   . HYPOTHYROIDISM   . JOINT EFFUSION, KNEE   . KNEE, ARTHRITIS, DEGEN./OSTEO    right knee   . Myocardial infarction ([place])    1994   . NEPHROLITHIASIS, HX OF   . OSTEOPOROSIS   . Other dysphagia   . PERIPHERAL VASCULAR DISEASE    AAA - 1994 with reimplant of renals   . Peripheral vascular disease ([place])    subclavian stenosis PTA - 3/08   . Pure hypercholesterolemia   . Renal artery stenosis [facility])     MEDICATIONS: Current Outpatient Medications on File Prior to Visit  Medication Sig Dispense Refill  . acetaminophen (TYLENOL) 325 MG tablet Take 325-650 mg by mouth every 6 (six)  hours as needed for headache (for pain).     [name] ALPRAZolam (XANAX) 0.25 MG tablet Take 1 tablet (0.25 mg total) by mouth 3 (three) times daily as needed for anxiety. 30 tablet 0  . bumetanide (BUMEX) 1 MG tablet Take 1 tablet (1 mg total) by mouth daily as needed (swelling). 30 tablet 1  . donepezil (ARICEPT) 5 MG tablet Take 1 tablet (5 mg total) by mouth at bedtime. 90 tablet 1  . lansoprazole (PREVACID) 15 MG capsule Take 1 capsule (15 mg total) by mouth daily. 30 capsule 5  . levothyroxine (SYNTHROID, LEVOTHROID) 112 MCG tablet Take 1 tablet (112 mcg total) by mouth daily. 90 tablet 1  . linaclotide (LINZESS) 145 MCG CAPS capsule Take 1 capsule (145 mcg total) by mouth daily before breakfast. 30 capsule 5  . magic mouthwash w/lidocaine SOLN Composition: 80 cc each of Extra St. Maalox Plus, Diphenhydramine, and Nystatin. Plus 240 cc of 2% viscous lidocaine to make 480 cc. Swish and swallow 2 tsp four times per day. 480 mL 2  . metoprolol tartrate (LOPRESSOR) 25 MG tablet TAKE ONE-HALF TABLET BY MOUTH TWO TIMES DAILY 30 tablet 5  . niacin (SLO-NIACIN) 500 MG tablet Take 3 tablets (1,500 mg total) by mouth at bedtime. 30 tablet 0  . nitroGLYCERIN (NITROSTAT) 0.4 MG SL tablet  Place 1 tablet (0.4 mg total) under the tongue every 5 (five) minutes as needed for chest pain. 25 tablet 10  . ondansetron (ZOFRAN) 8 MG tablet Take 1 tablet (8 mg total) by mouth every 8 (eight) hours as needed for nausea or vomiting. 20 tablet 0  . potassium chloride SA (K-DUR,KLOR-CON) 20 MEQ tablet Take 1 tablet (20 mEq total) by mouth daily. 90 tablet 0  . prochlorperazine (COMPAZINE) 10 MG tablet Take 1 tablet (10 mg total) by mouth every 6 (six) hours as needed for nausea or vomiting. 30 tablet 0  . rosuvastatin (CRESTOR) 20 MG tablet Take 1 tablet (20 mg total) by mouth daily. 30 tablet 5  . sucralfate (CARAFATE) 1 GM/10ML suspension Take 10 mLs (1 g total) by mouth 4 (four) times daily -  with meals and at bedtime. 420  mL 1  . tamsulosin (FLOMAX) 0.4 MG CAPS capsule TAKE ONE CAPSULE BY MOUTH TWICE A DAY. 60 capsule 11  . warfarin (COUMADIN) 5 MG tablet TAKE 1 TABLET DAILY. TAKE AS DIRECTED BYPHYSICIAN 90 tablet 1   No current facility-administered medications on file prior to visit.     ALLERGIES: Allergies  Allergen Reactions  . Neurontin [Gabapentin] Other (See Comments)    Caused confusion  . Imitrex [Sumatriptan] Nausea And Vomiting and Other (See Comments)    "made me like I was having a stoke"  . Lipitor [Atorvastatin] Nausea And Vomiting and Other (See Comments)    INTOLERANCE > MYALGIAS "couldn't get out of the bed ( pt had to crawl out of bed ) I hurt so bad"    FAMILY HISTORY: Family History  Problem Relation Age of Onset  . Hypertension Mother   . Heart disease Father   . Heart attack Father   . Diabetes Sister   . Cancer Brother 40       Lung  . Diabetes Sister     SOCIAL HISTORY: Social History   Socioeconomic History  . Marital status: Married    Spouse name: Not on file  . Number of children: Not on file  . Years of education: Not on file  . Highest education level: Not on file  Occupational History  . Not on file  Social Needs  . Financial resource strain: Not on file  . Food insecurity:    Worry: Not on file    Inability: Not on file  . Transportation needs:    Medical: Not on file    Non-medical: Not on file  Tobacco Use  . Smoking status: Former Smoker    Types: Cigarettes    Last attempt to quit: 06/20/1992    Years since quitting: 25.2  . Smokeless tobacco: Never Used  Substance and Sexual Activity  . Alcohol use: No    Alcohol/week: 0.0 oz  . Drug use: No  . Sexual activity: Not on file  Lifestyle  . Physical activity:    Days per week: Not on file    Minutes per session: Not on file  . Stress: Not on file  Relationships  . Social connections:    Talks on phone: Not on file    Gets together: Not on file    Attends religious service: Not on  file    Active member of club or organization: Not on file    Attends meetings of clubs or organizations: Not on file    Relationship status: Not on file  . Intimate partner violence:    Fear of current or ex  partner: Not on file    Emotionally abused: Not on file    Physically abused: Not on file    Forced sexual activity: Not on file  Other Topics Concern  . Not on file  Social History Narrative  . Not on file    REVIEW OF SYSTEMS: Constitutional: No fevers, chills, or sweats, no generalized fatigue, change in appetite Eyes: No visual changes, double vision, eye pain Ear, nose and throat: No hearing loss, ear pain, nasal congestion, sore throat Cardiovascular: No chest pain, palpitations Respiratory:  No shortness of breath at rest or with exertion, wheezes GastrointestinaI: No nausea, vomiting, diarrhea, abdominal pain, fecal incontinence Genitourinary:  No dysuria, urinary retention or frequency Musculoskeletal:  No neck pain, back pain Integumentary: No rash, pruritus, skin lesions Neurological: as above Psychiatric: No depression, insomnia, anxiety Endocrine: No palpitations, fatigue, diaphoresis, mood swings, change in appetite, change in weight, increased thirst Hematologic/Lymphatic:  No anemia, purpura, petechiae. Allergic/Immunologic: no itchy/runny eyes, nasal congestion, recent allergic reactions, rashes  PHYSICAL EXAM: Vitals:   09/18/17 1529  BP: 120/66  Pulse: 67  SpO2: 97%   General: No acute distress Head:  Normocephalic/atraumatic Neck: supple, no paraspinal tenderness, full range of motion Heart:  Regular rate and rhythm Lungs:  Clear to auscultation bilaterally Back: No paraspinal tenderness Skin/Extremities: No rash, no edema Neurological Exam: Mental status: alert and oriented to person, place, and month/day of week. States year is 60. No dysarthria or aphasia, Fund of knowledge is appropriate.  Recent and remote memory are impaired. Attention and  concentration are normal.    Able to name objects and repeat phrases. CDT 4/5 MMSE - Mini Mental State Exam 09/18/2017 12/20/2016  Orientation to time 3 1  Orientation to Place 5 5  Registration 3 3  Attention/ Calculation 3 4  Recall 0 0  Language- name 2 objects 2 2  Language- repeat 1 1  Language- follow 3 step command 3 3  Language- read & follow direction 1 1  Write a sentence 1 0  Copy design 1 0  Total score 23 20   Cranial nerves: CN I: not tested CN II: pupils equal, round and reactive to light, visual fields intact CN III, IV, VI:  full range of motion, no nystagmus, no ptosis CN V: facial sensation intact CN VII: upper and lower face symmetric CN VIII: hearing intact to finger rub CN IX, X: gag intact, uvula midline CN XI: sternocleidomastoid and trapezius muscles intact CN XII: tongue midline Bulk & Tone: normal, no fasciculations. Motor: 5/5 throughout with no pronator drift. Sensation: intact to light touch.  No extinction to double simultaneous stimulation.  Romberg test negative Deep Tendon Reflexes: +1 throughout, no ankle clonus Plantar responses: mute bilaterally Cerebellar: no incoordination on finger to nose testing Gait: narrow-based and steady, uses cane for balance, no ataxia   Tremor: none  IMPRESSION: This is a pleasant 77 yo RH man with a multiple medical issues including CAD s/p CABG in 1995, abdominal aortic aneurysm, carotid stenosis s/p bilateral CEA, diverticulosis, atrial fibrillation, hypertension, hiatal hernia, hypothyroidism, osteoporosis, dyslipidemia, renal artery stenosis, who fell off a 20-foot ladder last March 2018 sustaining pelvic and transverse process fractures. As part of his evaluation, he had a CT C/A/P which showed pulmonary densities with biopsy consistent with adenocarcinoma. He underwent chemotherapy and radiation, and continues on immunotherapy. During his hospitalization, his brain imaging showed bilateral strokes, with evolving  left frontal subacute to chronic hemorrhagic infarct or cortical contusion, and watershed infarcts in  the right hemisphere concerning for proximal high grade carotid stenosis. CTA neck showed 80% stenosis of the distal right common carotid artery with irregular plaque with ulceration. He follows closely with Vascular surgery. He has not had any neurological changes since his last visit, MMSE today 23/30 (20/30 in July 2018). Continue Donepezil 5mg  daily. Continue follow-up with Oncology and Vascular surgery. He and his wife know to go to the ER immediately for any change in symptoms. He will follow-up in 6 months and knows to call for any changes.   Thank you for allowing me to participate in his care.  Please do not hesitate to call for any questions or concerns.  The duration of this appointment visit was 25 minutes of face-to-face time with the patient.  Greater than 50% of this time was spent in counseling, explanation of diagnosis, planning of further management, and coordination of care.   Ellouise Newer, M.D.   CC: Dr. Wolfgang Phoenix, Dr. Julien Nordmann, Dr. Donnetta Hutching

## 2017-09-26 ENCOUNTER — Ambulatory Visit (HOSPITAL_COMMUNITY)
Admission: RE | Admit: 2017-09-26 | Discharge: 2017-09-26 | Disposition: A | Discharge status: Home / Self Care | Payer: Medicare Other | Source: Ambulatory Visit | Attending: Oncology | Admitting: Oncology

## 2017-09-26 DIAGNOSIS — C342 Malignant neoplasm of middle lobe, bronchus or lung: Secondary | ICD-10-CM | POA: Diagnosis not present

## 2017-09-26 DIAGNOSIS — I700 Atherosclerosis of aorta: Secondary | ICD-10-CM | POA: Insufficient documentation

## 2017-09-26 DIAGNOSIS — J439 Emphysema, unspecified: Secondary | ICD-10-CM | POA: Insufficient documentation

## 2017-09-26 DIAGNOSIS — Y842 Radiological procedure and radiotherapy as the cause of abnormal reaction of the patient, or of later complication, without mention of misadventure at the time of the procedure: Secondary | ICD-10-CM | POA: Diagnosis not present

## 2017-09-26 MED ORDER — IOPAMIDOL (ISOVUE-300) INJECTION 61%
75.0000 mL | Freq: Once | INTRAVENOUS | Status: AC | PRN
  Administered 2017-09-26: 75 mL via INTRAVENOUS

## 2017-09-27 ENCOUNTER — Telehealth: Payer: Self-pay | Admitting: Internal Medicine

## 2017-09-27 ENCOUNTER — Inpatient Hospital Stay (HOSPITAL_BASED_OUTPATIENT_CLINIC_OR_DEPARTMENT_OTHER): Payer: Medicare Other | Admitting: Internal Medicine

## 2017-09-27 ENCOUNTER — Inpatient Hospital Stay: Payer: Medicare Other

## 2017-09-27 ENCOUNTER — Encounter: Payer: Self-pay | Admitting: Internal Medicine

## 2017-09-27 ENCOUNTER — Inpatient Hospital Stay: Payer: Medicare Other | Attending: Nurse Practitioner

## 2017-09-27 VITALS — BP 121/53 | HR 69 | Temp 97.6°F | Resp 18 | Ht 67.0 in | Wt 131.5 lb

## 2017-09-27 DIAGNOSIS — C342 Malignant neoplasm of middle lobe, bronchus or lung: Secondary | ICD-10-CM | POA: Insufficient documentation

## 2017-09-27 DIAGNOSIS — E039 Hypothyroidism, unspecified: Secondary | ICD-10-CM

## 2017-09-27 DIAGNOSIS — Z5112 Encounter for antineoplastic immunotherapy: Secondary | ICD-10-CM

## 2017-09-27 DIAGNOSIS — R1310 Dysphagia, unspecified: Secondary | ICD-10-CM | POA: Insufficient documentation

## 2017-09-27 DIAGNOSIS — C3411 Malignant neoplasm of upper lobe, right bronchus or lung: Secondary | ICD-10-CM

## 2017-09-27 DIAGNOSIS — Z79899 Other long term (current) drug therapy: Secondary | ICD-10-CM | POA: Diagnosis not present

## 2017-09-27 DIAGNOSIS — I10 Essential (primary) hypertension: Secondary | ICD-10-CM

## 2017-09-27 LAB — COMPREHENSIVE METABOLIC PANEL
ALT: 19 U/L (ref 0–55)
ANION GAP: 8 (ref 3–11)
AST: 23 U/L (ref 5–34)
Albumin: 3.7 g/dL (ref 3.5–5.0)
Alkaline Phosphatase: 136 U/L (ref 40–150)
BILIRUBIN TOTAL: 0.4 mg/dL (ref 0.2–1.2)
BUN: 15 mg/dL (ref 7–26)
CHLORIDE: 104 mmol/L (ref 98–109)
CO2: 24 mmol/L (ref 22–29)
Calcium: 9.7 mg/dL (ref 8.4–10.4)
Creatinine, Ser: 0.87 mg/dL (ref 0.70–1.30)
Glucose, Bld: 102 mg/dL (ref 70–140)
POTASSIUM: 4.3 mmol/L (ref 3.5–5.1)
Sodium: 136 mmol/L (ref 136–145)
TOTAL PROTEIN: 7.2 g/dL (ref 6.4–8.3)

## 2017-09-27 LAB — CBC WITH DIFFERENTIAL/PLATELET
BASOS ABS: 0 10*3/uL (ref 0.0–0.1)
Basophils Relative: 0 %
EOS PCT: 2 %
Eosinophils Absolute: 0.1 10*3/uL (ref 0.0–0.5)
HCT: 38.8 % (ref 38.4–49.9)
HEMOGLOBIN: 13.6 g/dL (ref 13.0–17.1)
LYMPHS PCT: 18 %
Lymphs Abs: 0.9 10*3/uL (ref 0.9–3.3)
MCH: 30.3 pg (ref 27.2–33.4)
MCHC: 35.1 g/dL (ref 32.0–36.0)
MCV: 86.4 fL (ref 79.3–98.0)
Monocytes Absolute: 0.3 10*3/uL (ref 0.1–0.9)
Monocytes Relative: 5 %
NEUTROS ABS: 3.9 10*3/uL (ref 1.5–6.5)
NEUTROS PCT: 75 %
PLATELETS: 127 10*3/uL — AB (ref 140–400)
RBC: 4.49 MIL/uL (ref 4.20–5.82)
RDW: 15.7 % — ABNORMAL HIGH (ref 11.0–14.6)
WBC: 5.2 10*3/uL (ref 4.0–10.3)

## 2017-09-27 LAB — TSH: TSH: 0.476 u[IU]/mL (ref 0.320–4.118)

## 2017-09-27 MED ORDER — SODIUM CHLORIDE 0.9 % IV SOLN
10.0000 mg/kg | Freq: Once | INTRAVENOUS | Status: AC
  Administered 2017-09-27: 620 mg via INTRAVENOUS
  Filled 2017-09-27: qty 10

## 2017-09-27 MED ORDER — SODIUM CHLORIDE 0.9 % IV SOLN
Freq: Once | INTRAVENOUS | Status: AC
  Administered 2017-09-27: 12:00:00 via INTRAVENOUS

## 2017-09-27 NOTE — Progress Notes (Signed)
[place] [phone]   [phone]  OFFICE PROGRESS NOTE  [name], MD [address] [place] [address]  DIAGNOSIS: Stage IIIA (T1b, N2, M0) non-small cell lung cancer, adenocarcinoma with positive PDL 1 expression 50% diagnosed in March 2018 and presented with right upper lobe lung nodule in addition to mediastinal lymphadenopathy.  PRIOR THERAPY: Course of concurrent chemoradiation with weekly carboplatin for AUC of 2 and paclitaxel 45 MG/M2. First dose of chemotherapy 10/17/2016. Status post 8 cycles. Last dose was given 12/05/2016 with partial response.[name]  CURRENT THERAPY: Consolidation treatment with immunotherapy with Imfinzi (Durvalumab) 10 MG/KG every 2 weeks. First dose 01/18/2017. Status post 18 cycles.  INTERVAL HISTORY: [name] [name] 77 y.o. male returns to the clinic today for follow-up visit accompanied by his wife.  The patient has no complaints today.  He denied having any chest pain, shortness of breath, cough or hemoptysis.  He denied having any fever or chills.  He has no nausea, vomiting, diarrhea or constipation.  He denied having any significant weight loss or night sweats.  He continues to tolerate his treatment with Imfinzi (Durvalumab) fairly well.  The patient had repeat CT scan of the chest performed yesterday and he is here for evaluation and discussion of his discuss results before the next cycle of his treatment.  MEDICAL HISTORY: Past Medical History:  Diagnosis Date  . Adenocarcinoma of right lung, stage 3 ([facility]) 09/20/2016  . Atrial fibrillation ([facility])    no hx of reported at preop visit of 04/21/11   . CAD (coronary artery disease)   . Carotid artery occlusion    left carotid endarterectomy  . Chronic kidney disease    AAA repair with reimplant of renals   . CHRONIC OBSTRUCTIVE PULMONARY DISEASE    pt denied at visit of 04/21/11   . CORONARY ARTERY DISEASE   . Dehydration 11/15/2016  .  Disorder of left sacroiliac joint 08/24/2016  . DIVERTICULOSIS OF COLON   . Encounter for antineoplastic chemotherapy 10/17/2016  . GERD   . H/O hiatal hernia   . Headache(784.0)    Hx: of years of years  . HYPERTENSION   . HYPOTHYROIDISM   . JOINT EFFUSION, KNEE   . KNEE, ARTHRITIS, DEGEN./OSTEO    right knee   . Myocardial infarction ([facility])    1994   . NEPHROLITHIASIS, HX OF   . OSTEOPOROSIS   . Other dysphagia   . PERIPHERAL VASCULAR DISEASE    AAA - 1994 with reimplant of renals   . Peripheral vascular disease ([facility])    subclavian stenosis PTA - 3/08   . Pure hypercholesterolemia   . Renal artery stenosis ([facility])     ALLERGIES:  is allergic to neurontin [gabapentin]; imitrex [sumatriptan]; and lipitor [atorvastatin].  MEDICATIONS:  Current Outpatient Medications  Medication Sig Dispense Refill  . acetaminophen (TYLENOL) 325 MG tablet Take 325-650 mg by mouth every 6 (six) hours as needed for headache (for pain).     [name] ALPRAZolam (XANAX) 0.25 MG tablet Take 1 tablet (0.25 mg total) by mouth 3 (three) times daily as needed for anxiety. 30 tablet 0  . bumetanide (BUMEX) 1 MG tablet Take 1 tablet (1 mg total) by mouth daily as needed (swelling). 30 tablet 1  . donepezil (ARICEPT) 5 MG tablet Take 1 tablet (5 mg total) by mouth at bedtime. 90 tablet 1  . lansoprazole (PREVACID) 15 MG capsule Take 1 capsule (15 mg total) by  mouth daily. 30 capsule 5  . levothyroxine (SYNTHROID, LEVOTHROID) 112 MCG tablet Take 1 tablet (112 mcg total) by mouth daily. 90 tablet 1  . linaclotide (LINZESS) 145 MCG CAPS capsule Take 1 capsule (145 mcg total) by mouth daily before breakfast. 30 capsule 5  . magic mouthwash w/lidocaine SOLN Composition: 80 cc each of Extra St. Maalox Plus, Diphenhydramine, and Nystatin. Plus 240 cc of 2% viscous lidocaine to make 480 cc. Swish and swallow 2 tsp four times per day. 480 mL 2  . metoprolol tartrate (LOPRESSOR) 25 MG tablet TAKE ONE-HALF TABLET BY MOUTH TWO TIMES  DAILY 30 tablet 5  . niacin (SLO-NIACIN) 500 MG tablet Take 3 tablets (1,500 mg total) by mouth at bedtime. 30 tablet 0  . ondansetron (ZOFRAN) 8 MG tablet Take 1 tablet (8 mg total) by mouth every 8 (eight) hours as needed for nausea or vomiting. 20 tablet 0  . potassium chloride SA (K-DUR,KLOR-CON) 20 MEQ tablet Take 1 tablet (20 mEq total) by mouth daily. 90 tablet 0  . prochlorperazine (COMPAZINE) 10 MG tablet Take 1 tablet (10 mg total) by mouth every 6 (six) hours as needed for nausea or vomiting. 30 tablet 0  . rosuvastatin (CRESTOR) 20 MG tablet Take 1 tablet (20 mg total) by mouth daily. 30 tablet 5  . sucralfate (CARAFATE) 1 GM/10ML suspension Take 10 mLs (1 g total) by mouth 4 (four) times daily -  with meals and at bedtime. 420 mL 1  . tamsulosin (FLOMAX) 0.4 MG CAPS capsule TAKE ONE CAPSULE BY MOUTH TWICE A DAY. 60 capsule 11  . warfarin (COUMADIN) 5 MG tablet TAKE 1 TABLET DAILY. TAKE AS DIRECTED BYPHYSICIAN 90 tablet 1  . nitroGLYCERIN (NITROSTAT) 0.4 MG SL tablet Place 1 tablet (0.4 mg total) under the tongue every 5 (five) minutes as needed for chest pain. (Patient not taking: Reported on 09/27/2017) 25 tablet 10   No current facility-administered medications for this visit.     SURGICAL HISTORY:  Past Surgical History:  Procedure Laterality Date  . ABDOMINAL AORTIC ANEURYSM REPAIR     with reimplantation of renals   . CARDIAC CATHETERIZATION     2008  . CAROTID ENDARTERECTOMY Left Jan. 30, 2015   CEA  . CHOLECYSTECTOMY  2000   Gall Bladder  . COLONOSCOPY W/ BIOPSIES AND POLYPECTOMY     Hx: of  . CORONARY ARTERY BYPASS GRAFT     1995  . CORONARY STENT PLACEMENT     Hx: of  . ENDARTERECTOMY Left 07/19/2013   Procedure: ENDARTERECTOMY CAROTID;  Surgeon: Mal Misty, MD;  Location: [facility];  Service: Vascular;  Laterality: Left;  . GALLBLADDER SURGERY  2004   . JOINT REPLACEMENT     partial knee replacement on left 2002   . KNEE SURGERY    . ORIF PELVIC FRACTURE Left  08/25/2016   Procedure: OPEN REDUCTION INTERNAL FIXATION (ORIF) PELVIC FRACTURE; plate on front, SI screw on the back;  Surgeon: Altamese Melody Hill, MD;  Location: [facility];  Service: Orthopedics;  Laterality: Left;  . OTHER SURGICAL HISTORY     left subclavian stenosis surgery PTA 08/2006   . OTHER SURGICAL HISTORY     carotid surgery on right 2004   . TOTAL KNEE ARTHROPLASTY  04/29/2011   Procedure: TOTAL KNEE ARTHROPLASTY;  Surgeon: Gearlean Alf;  Location: [facility];  Service: Orthopedics;  Laterality: Right;  . urinary retention    . VASCULAR SURGERY     AAA  . VASECTOMY  [facility] WITH ENDOBRONCHIAL ULTRASOUND [name] 09/09/2016   Procedure: VIDEO BRONCHOSCOPY WITH ENDOBRONCHIAL ULTRASOUND;  Surgeon: Grace Isaac, MD;  Location: [facility];  Service: Thoracic;  Laterality: [name];    REVIEW OF SYSTEMS:  Constitutional: negative Eyes: negative Ears, nose, mouth, throat, and face: negative Respiratory: negative Cardiovascular: negative Gastrointestinal: negative Genitourinary:negative Integument/breast: negative Hematologic/lymphatic: negative Musculoskeletal:negative Neurological: negative Behavioral/Psych: negative Endocrine: negative Allergic/Immunologic: negative   PHYSICAL EXAMINATION: General appearance: alert, cooperative and no distress Head: Normocephalic, without obvious abnormality, atraumatic Neck: no adenopathy, no JVD, supple, symmetrical, trachea midline and thyroid not enlarged, symmetric, no tenderness/mass/nodules Lymph nodes: Cervical, supraclavicular, and axillary nodes normal. Resp: clear to auscultation bilaterally Back: symmetric, no curvature. ROM normal. No CVA tenderness. Cardio: regular rate and rhythm, S1, S2 normal, no murmur, click, rub or gallop GI: soft, non-tender; bowel sounds normal; no masses,  no organomegaly Extremities: extremities normal, atraumatic, no cyanosis or edema Neurologic: Alert and oriented X 3, normal strength and tone.  Normal symmetric reflexes. Normal coordination and gait  ECOG PERFORMANCE STATUS: 1 - Symptomatic but completely ambulatory  Blood pressure (!) 121/53, pulse 69, temperature 97.6 F (36.4 C), temperature source Oral, resp. rate 18, height 5\' 7"  (1.702 m), weight 131 lb 8 oz (59.6 kg), SpO2 98 %.  LABORATORY DATA: Lab Results  Component Value Date   WBC 5.2 09/27/2017   HGB 13.6 09/27/2017   HCT 38.8 09/27/2017   MCV 86.4 09/27/2017   PLT 127 (L) 09/27/2017      Chemistry      Component Value Date/Time   NA 136 09/27/2017 0916   NA 136 06/21/2017 1246   K 4.3 09/27/2017 0916   K 3.6 06/21/2017 1246   CL 104 09/27/2017 0916   CO2 24 09/27/2017 0916   CO2 25 06/21/2017 1246   BUN 15 09/27/2017 0916   BUN 14.0 06/21/2017 1246   CREATININE 0.87 09/27/2017 0916   CREATININE 0.8 06/21/2017 1246      Component Value Date/Time   CALCIUM 9.7 09/27/2017 0916   CALCIUM 9.4 06/21/2017 1246   ALKPHOS 136 09/27/2017 0916   ALKPHOS 139 06/21/2017 1246   AST 23 09/27/2017 0916   AST 18 06/21/2017 1246   ALT 19 09/27/2017 0916   ALT 17 06/21/2017 1246   BILITOT 0.4 09/27/2017 0916   BILITOT 0.41 06/21/2017 1246       RADIOGRAPHIC STUDIES: Ct Chest W Contrast  Result Date: 09/27/2017 CLINICAL DATA:  Right middle lobe lung cancer. Status post chemo and XRT. EXAM: CT CHEST WITH CONTRAST TECHNIQUE: Multidetector CT imaging of the chest was performed during intravenous contrast administration. CONTRAST:  91mL ISOVUE-300 IOPAMIDOL (ISOVUE-300) INJECTION 61% COMPARISON:  06/30/2017 FINDINGS: Cardiovascular: Normal heart size. There is aortic atherosclerosis. Previous median sternotomy and CABG procedure. Mediastinum/Nodes: The trachea appears patent and is midline. Normal appearance of the esophagus. Low right paratracheal lymph node measures 8 mm, image 62/2. Previously 5 mm. No hilar adenopathy. No axillary or supraclavicular adenopathy. Lungs/Pleura: Mild emphysema. Paramediastinal  masslike architectural distortion and fibrosis is again identified compatible with changes of external beam radiation. 4 mm right upper lobe lung nodule is identified, image 38/4. Stable. 2-3 mm nodule in the right upper lobe is unchanged, image 75/4. Upper Abdomen: No acute findings. Musculoskeletal: The bones appear osteopenic. Mild spondylosis identified within the thoracic spine. Stable midthoracic spine mild compression deformities. IMPRESSION: 1. Stable appearance of radiation change in the right lung. No evidence for local tumor recurrence. 2. Small pulmonary nodules in  the right lung are stable 3. Low right paratracheal lymph node measures 8 mm. On the prior exam this measures 5 mm. Although not specific for metastatic disease this area warrants attention on follow-up exam. 4. Aortic Atherosclerosis (ICD10-I70.0) and Emphysema (ICD10-J43.9). Electronically Signed   By: Kerby Moors M.D.   On: 09/27/2017 10:06    ASSESSMENT AND PLAN:  This is a very pleasant 77 years old white male with a stage IIIa non-small cell lung cancer, adenocarcinoma with positive PDL 1 expression of 50%. The patient completed a course of concurrent chemoradiation with weekly carboplatin and paclitaxel status post 8 weeks and tolerated his treatment well except for mild dysphagia and odynophagia. He is currently undergoing consolidation immunotherapy with Imfinzi (Durvalumab) status post 18 cycles. The patient continues to tolerate this treatment fairly well. The recent CT scan of the chest showed no concerning findings for disease progression. I personally and independently reviewed the scans and discussed the results with the patient and his wife.  I recommended for him to continue with his current consolidation immunotherapy with Imfinzi (Durvalumab).  He will proceed with cycle #19 today. The patient will come back for follow-up visit in 2 weeks for evaluation before the next cycle of his treatment. He was advised to  call immediately if he has any concerning symptoms in the interval. The patient voices understanding of current disease status and treatment options and is in agreement with the current care plan. All questions were answered. The patient knows to call the clinic with any problems, questions or concerns. We can certainly see the patient much sooner if necessary.  Disclaimer: This note was dictated with voice recognition software. Similar sounding words can inadvertently be transcribed and may not be corrected upon review.

## 2017-09-27 NOTE — Patient Instructions (Signed)
[facility] Discharge Instructions for Patients Receiving Chemotherapy  Today you received the following chemotherapy agents: Imfinzi.  To help prevent nausea and vomiting after your treatment, we encourage you to take your nausea medication as directed.   If you develop nausea and vomiting that is not controlled by your nausea medication, call the clinic.   BELOW ARE SYMPTOMS THAT SHOULD BE REPORTED IMMEDIATELY:  *FEVER GREATER THAN 100.5 F  *CHILLS WITH OR WITHOUT FEVER  NAUSEA AND VOMITING THAT IS NOT CONTROLLED WITH YOUR NAUSEA MEDICATION  *UNUSUAL SHORTNESS OF BREATH  *UNUSUAL BRUISING OR BLEEDING  TENDERNESS IN MOUTH AND THROAT WITH OR WITHOUT PRESENCE OF ULCERS  *URINARY PROBLEMS  *BOWEL PROBLEMS  UNUSUAL RASH Items with * indicate a potential emergency and should be followed up as soon as possible.  Feel free to call the clinic should you have any questions or concerns. The clinic phone number is [phone].  Please show the CHEMO ALERT CARD at check-in to the Emergency Department and triage nurse.   

## 2017-09-27 NOTE — Telephone Encounter (Signed)
3 cycles already scheduled per 4/10 los. - no additional apts to schedule at the moment.

## 2017-10-04 ENCOUNTER — Other Ambulatory Visit: Payer: Self-pay | Admitting: *Deleted

## 2017-10-04 MED ORDER — ROSUVASTATIN CALCIUM 20 MG PO TABS: 20.0000 mg | ORAL_TABLET | Freq: Every day | ORAL | 0 refills | Status: DC

## 2017-10-10 ENCOUNTER — Ambulatory Visit (INDEPENDENT_AMBULATORY_CARE_PROVIDER_SITE_OTHER): Payer: Medicare Other

## 2017-10-10 DIAGNOSIS — I482 Chronic atrial fibrillation, unspecified: Secondary | ICD-10-CM

## 2017-10-10 LAB — POCT INR: INR: 2.8

## 2017-10-10 NOTE — Patient Instructions (Signed)
Keep same.

## 2017-10-11 ENCOUNTER — Encounter: Payer: Self-pay | Admitting: Oncology

## 2017-10-11 ENCOUNTER — Inpatient Hospital Stay: Payer: Medicare Other

## 2017-10-11 ENCOUNTER — Inpatient Hospital Stay (HOSPITAL_BASED_OUTPATIENT_CLINIC_OR_DEPARTMENT_OTHER): Payer: Medicare Other | Admitting: Oncology

## 2017-10-11 ENCOUNTER — Telehealth: Payer: Self-pay | Admitting: Oncology

## 2017-10-11 VITALS — BP 110/43 | HR 71 | Temp 98.1°F | Resp 18 | Ht 67.0 in | Wt 132.8 lb

## 2017-10-11 DIAGNOSIS — C342 Malignant neoplasm of middle lobe, bronchus or lung: Secondary | ICD-10-CM

## 2017-10-11 DIAGNOSIS — Z79899 Other long term (current) drug therapy: Secondary | ICD-10-CM | POA: Diagnosis not present

## 2017-10-11 DIAGNOSIS — Z5112 Encounter for antineoplastic immunotherapy: Secondary | ICD-10-CM

## 2017-10-11 DIAGNOSIS — R5383 Other fatigue: Secondary | ICD-10-CM

## 2017-10-11 DIAGNOSIS — R1310 Dysphagia, unspecified: Secondary | ICD-10-CM | POA: Diagnosis not present

## 2017-10-11 DIAGNOSIS — C3411 Malignant neoplasm of upper lobe, right bronchus or lung: Secondary | ICD-10-CM | POA: Diagnosis not present

## 2017-10-11 LAB — CBC WITH DIFFERENTIAL/PLATELET
BASOS PCT: 1 %
Basophils Absolute: 0 10*3/uL (ref 0.0–0.1)
EOS ABS: 0.1 10*3/uL (ref 0.0–0.5)
Eosinophils Relative: 1 %
HCT: 37.3 % — ABNORMAL LOW (ref 38.4–49.9)
HEMOGLOBIN: 13 g/dL (ref 13.0–17.1)
LYMPHS ABS: 0.8 10*3/uL — AB (ref 0.9–3.3)
Lymphocytes Relative: 17 %
MCH: 30 pg (ref 27.2–33.4)
MCHC: 35 g/dL (ref 32.0–36.0)
MCV: 85.8 fL (ref 79.3–98.0)
Monocytes Absolute: 0.3 10*3/uL (ref 0.1–0.9)
Monocytes Relative: 6 %
NEUTROS PCT: 75 %
Neutro Abs: 3.5 10*3/uL (ref 1.5–6.5)
Platelets: 147 10*3/uL (ref 140–400)
RBC: 4.34 MIL/uL (ref 4.20–5.82)
RDW: 15.3 % — ABNORMAL HIGH (ref 11.0–14.6)
WBC: 4.7 10*3/uL (ref 4.0–10.3)

## 2017-10-11 LAB — COMPREHENSIVE METABOLIC PANEL
ALBUMIN: 3.7 g/dL (ref 3.5–5.0)
ALK PHOS: 119 U/L (ref 40–150)
ALT: 20 U/L (ref 0–55)
AST: 24 U/L (ref 5–34)
Anion gap: 10 (ref 3–11)
BILIRUBIN TOTAL: 0.4 mg/dL (ref 0.2–1.2)
BUN: 16 mg/dL (ref 7–26)
CO2: 22 mmol/L (ref 22–29)
CREATININE: 0.89 mg/dL (ref 0.70–1.30)
Calcium: 9.8 mg/dL (ref 8.4–10.4)
Chloride: 105 mmol/L (ref 98–109)
GFR calc Af Amer: >60 mL/min (ref >60)
GFR calc non Af Amer: >60 mL/min (ref >60)
Glucose, Bld: 100 mg/dL (ref 70–140)
POTASSIUM: 3.7 mmol/L (ref 3.5–5.1)
SODIUM: 137 mmol/L (ref 136–145)
Total Protein: 6.8 g/dL (ref 6.4–8.3)

## 2017-10-11 MED ORDER — SODIUM CHLORIDE 0.9 % IV SOLN
Freq: Once | INTRAVENOUS | Status: AC
  Administered 2017-10-11: 11:00:00 via INTRAVENOUS

## 2017-10-11 MED ORDER — SODIUM CHLORIDE 0.9 % IV SOLN
10.0000 mg/kg | Freq: Once | INTRAVENOUS | Status: AC
  Administered 2017-10-11: 620 mg via INTRAVENOUS
  Filled 2017-10-11: qty 10

## 2017-10-11 NOTE — Assessment & Plan Note (Signed)
This is a very pleasant 77 year old white male with a stage IIIa non-small cell lung cancer, adenocarcinoma with positive PDL 1 expression of 50%. The patient completed a course of concurrent chemoradiation with weekly carboplatin and paclitaxel status post 8 weeks and tolerated his treatment well except for mild dysphagia and odynophagia. He is currently undergoing consolidation immunotherapy with Imfinzi (Durvalumab) status post 19 cycles. The patient continues to tolerate this treatment fairly well. The patient will proceed with cycle 20 of Infinzi today as scheduled. The patient will come back for follow-up visit in 2 weeks for evaluation prior to cycle #21 of his treatment.  He was advised to call immediately if he has any concerning symptoms in the interval. The patient voices understanding of current disease status and treatment options and is in agreement with the current care plan. All questions were answered. The patient knows to call the clinic with any problems, questions or concerns. We can certainly see the patient much sooner if necessary.

## 2017-10-11 NOTE — Patient Instructions (Signed)
[facility] Discharge Instructions for Patients Receiving Chemotherapy  Today you received the following chemotherapy agents: Imfinzi.  To help prevent nausea and vomiting after your treatment, we encourage you to take your nausea medication as directed.   If you develop nausea and vomiting that is not controlled by your nausea medication, call the clinic.   BELOW ARE SYMPTOMS THAT SHOULD BE REPORTED IMMEDIATELY:  *FEVER GREATER THAN 100.5 F  *CHILLS WITH OR WITHOUT FEVER  NAUSEA AND VOMITING THAT IS NOT CONTROLLED WITH YOUR NAUSEA MEDICATION  *UNUSUAL SHORTNESS OF BREATH  *UNUSUAL BRUISING OR BLEEDING  TENDERNESS IN MOUTH AND THROAT WITH OR WITHOUT PRESENCE OF ULCERS  *URINARY PROBLEMS  *BOWEL PROBLEMS  UNUSUAL RASH Items with * indicate a potential emergency and should be followed up as soon as possible.  Feel free to call the clinic should you have any questions or concerns. The clinic phone number is [phone].  Please show the CHEMO ALERT CARD at check-in to the Emergency Department and triage nurse.   

## 2017-10-11 NOTE — Progress Notes (Signed)
[facility] OFFICE PROGRESS NOTE  [name], MD [address] [place] [address]  DIAGNOSIS: Stage IIIA (T1b, N2, M0) non-small cell lung cancer, adenocarcinoma with positive PDL 1 expression 50% diagnosed in March 2018 and presented with right upper lobe lung nodule in addition to mediastinal lymphadenopathy.  PRIOR THERAPY: Course of concurrent chemoradiation with weekly carboplatin for AUC of 2 and paclitaxel 45 MG/M2. First dose of chemotherapy 10/17/2016. Status post 8 cycles. Last dose was given 12/05/2016 with partial response.  CURRENT THERAPY: Consolidation treatment with immunotherapy with Imfinzi (Durvalumab) 10 MG/KG every 2 weeks. First dose 01/18/2017. Status post 19 cycles.  INTERVAL HISTORY: [name] [name] 77 y.o. male returns for routine follow-up visit accompanied by his wife.  The patient is feeling fine today and has no specific complaints.  Patient denies fevers or chills.  Denies chest pain, shortness breath, cough, hemoptysis.  Denies nausea, vomiting, constipation, diarrhea.  Denies significant weight loss or night sweats.  He continues to tolerate his treatment with Infinzi fairly well.  The patient is here for evaluation prior to cycle #20 of his treatment.  MEDICAL HISTORY: Past Medical History:  Diagnosis Date  . Adenocarcinoma of right lung, stage 3 ([facility]) 09/20/2016  . Atrial fibrillation ([facility])    no hx of reported at preop visit of 04/21/11   . CAD (coronary artery disease)   . Carotid artery occlusion    left carotid endarterectomy  . Chronic kidney disease    AAA repair with reimplant of renals   . CHRONIC OBSTRUCTIVE PULMONARY DISEASE    pt denied at visit of 04/21/11   . CORONARY ARTERY DISEASE   . Dehydration 11/15/2016  . Disorder of left sacroiliac joint 08/24/2016  . DIVERTICULOSIS OF COLON   . Encounter for antineoplastic chemotherapy 10/17/2016  . GERD   . H/O hiatal hernia   . Headache(784.0)    Hx: of years of  years  . HYPERTENSION   . HYPOTHYROIDISM   . JOINT EFFUSION, KNEE   . KNEE, ARTHRITIS, DEGEN./OSTEO    right knee   . Myocardial infarction ([facility])    1994   . NEPHROLITHIASIS, HX OF   . OSTEOPOROSIS   . Other dysphagia   . PERIPHERAL VASCULAR DISEASE    AAA - 1994 with reimplant of renals   . Peripheral vascular disease ([facility])    subclavian stenosis PTA - 3/08   . Pure hypercholesterolemia   . Renal artery stenosis ([facility])     ALLERGIES:  is allergic to neurontin [gabapentin]; imitrex [sumatriptan]; and lipitor [atorvastatin].  MEDICATIONS:  Current Outpatient Medications  Medication Sig Dispense Refill  . acetaminophen (TYLENOL) 325 MG tablet Take 325-650 mg by mouth every 6 (six) hours as needed for headache (for pain).     [name] ALPRAZolam (XANAX) 0.25 MG tablet Take 1 tablet (0.25 mg total) by mouth 3 (three) times daily as needed for anxiety. 30 tablet 0  . bumetanide (BUMEX) 1 MG tablet Take 1 tablet (1 mg total) by mouth daily as needed (swelling). 30 tablet 1  . donepezil (ARICEPT) 5 MG tablet Take 1 tablet (5 mg total) by mouth at bedtime. 90 tablet 1  . lansoprazole (PREVACID) 15 MG capsule Take 1 capsule (15 mg total) by mouth daily. 30 capsule 5  . levothyroxine (SYNTHROID, LEVOTHROID) 112 MCG tablet Take 1 tablet (112 mcg total) by mouth daily. 90 tablet 1  . linaclotide (LINZESS) 145 MCG CAPS capsule Take 1 capsule (145 mcg total) by  mouth daily before breakfast. 30 capsule 5  . magic mouthwash w/lidocaine SOLN Composition: 80 cc each of Extra St. Maalox Plus, Diphenhydramine, and Nystatin. Plus 240 cc of 2% viscous lidocaine to make 480 cc. Swish and swallow 2 tsp four times per day. 480 mL 2  . metoprolol tartrate (LOPRESSOR) 25 MG tablet TAKE ONE-HALF TABLET BY MOUTH TWO TIMES DAILY 30 tablet 5  . niacin (SLO-NIACIN) 500 MG tablet Take 3 tablets (1,500 mg total) by mouth at bedtime. 30 tablet 0  . nitroGLYCERIN (NITROSTAT) 0.4 MG SL tablet Place 1 tablet (0.4 mg total)  under the tongue every 5 (five) minutes as needed for chest pain. 25 tablet 10  . ondansetron (ZOFRAN) 8 MG tablet Take 1 tablet (8 mg total) by mouth every 8 (eight) hours as needed for nausea or vomiting. 20 tablet 0  . potassium chloride SA (K-DUR,KLOR-CON) 20 MEQ tablet Take 1 tablet (20 mEq total) by mouth daily. 90 tablet 0  . prochlorperazine (COMPAZINE) 10 MG tablet Take 1 tablet (10 mg total) by mouth every 6 (six) hours as needed for nausea or vomiting. 30 tablet 0  . rosuvastatin (CRESTOR) 20 MG tablet Take 1 tablet (20 mg total) by mouth daily. 90 tablet 0  . sucralfate (CARAFATE) 1 GM/10ML suspension Take 10 mLs (1 g total) by mouth 4 (four) times daily -  with meals and at bedtime. 420 mL 1  . tamsulosin (FLOMAX) 0.4 MG CAPS capsule TAKE ONE CAPSULE BY MOUTH TWICE A DAY. 60 capsule 11  . warfarin (COUMADIN) 5 MG tablet TAKE 1 TABLET DAILY. TAKE AS DIRECTED BYPHYSICIAN 90 tablet 1   No current facility-administered medications for this visit.     SURGICAL HISTORY:  Past Surgical History:  Procedure Laterality Date  . ABDOMINAL AORTIC ANEURYSM REPAIR     with reimplantation of renals   . CARDIAC CATHETERIZATION     2008  . CAROTID ENDARTERECTOMY Left Jan. 30, 2015   CEA  . CHOLECYSTECTOMY  2000   Gall Bladder  . COLONOSCOPY W/ BIOPSIES AND POLYPECTOMY     Hx: of  . CORONARY ARTERY BYPASS GRAFT     1995  . CORONARY STENT PLACEMENT     Hx: of  . ENDARTERECTOMY Left 07/19/2013   Procedure: ENDARTERECTOMY CAROTID;  Surgeon: Mal Misty, MD;  Location: [facility];  Service: Vascular;  Laterality: Left;  . GALLBLADDER SURGERY  2004   . JOINT REPLACEMENT     partial knee replacement on left 2002   . KNEE SURGERY    . ORIF PELVIC FRACTURE Left 08/25/2016   Procedure: OPEN REDUCTION INTERNAL FIXATION (ORIF) PELVIC FRACTURE; plate on front, SI screw on the back;  Surgeon: Altamese Franklin, MD;  Location: [facility];  Service: Orthopedics;  Laterality: Left;  . OTHER SURGICAL HISTORY      left subclavian stenosis surgery PTA 08/2006   . OTHER SURGICAL HISTORY     carotid surgery on right 2004   . TOTAL KNEE ARTHROPLASTY  04/29/2011   Procedure: TOTAL KNEE ARTHROPLASTY;  Surgeon: Gearlean Alf;  Location: [facility];  Service: Orthopedics;  Laterality: Right;  . urinary retention    . VASCULAR SURGERY     AAA  . VASECTOMY  1973  . VIDEO BRONCHOSCOPY WITH ENDOBRONCHIAL ULTRASOUND [name] 09/09/2016   Procedure: VIDEO BRONCHOSCOPY WITH ENDOBRONCHIAL ULTRASOUND;  Surgeon: Grace Isaac, MD;  Location: [facility];  Service: Thoracic;  Laterality: [name];    REVIEW OF SYSTEMS:   Review of Systems  Constitutional: Negative for appetite change, chills, fever and unexpected weight change. Positive for mild fatigue. HENT:   Negative for mouth sores, nosebleeds, sore throat and trouble swallowing.   Eyes: Negative for eye problems and icterus.  Respiratory: Negative for cough, hemoptysis, shortness of breath and wheezing.   Cardiovascular: Negative for chest pain and leg swelling.  Gastrointestinal: Negative for abdominal pain, constipation, diarrhea, nausea and vomiting.  Genitourinary: Negative for bladder incontinence, difficulty urinating, dysuria, frequency and hematuria.   Musculoskeletal: Negative for back pain, gait problem, neck pain and neck stiffness.  Skin: Negative for itching and rash.  Neurological: Negative for dizziness, extremity weakness, gait problem, headaches, light-headedness and seizures.  Hematological: Negative for adenopathy. Does not bruise/bleed easily.  Psychiatric/Behavioral: Negative for confusion, depression and sleep disturbance. The patient is not nervous/anxious.     PHYSICAL EXAMINATION:  Blood pressure (!) 110/43, pulse 71, temperature 98.1 F (36.7 C), temperature source Oral, resp. rate 18, height 5\' 7"  (1.702 m), weight 132 lb 12.8 oz (60.2 kg), SpO2 97 %.  ECOG PERFORMANCE STATUS: 1 - Symptomatic but completely ambulatory  Physical Exam   Constitutional: Oriented to person, place, and time and well-developed, well-nourished, and in no distress. No distress.  HENT:  Head: Normocephalic and atraumatic.  Mouth/Throat: Oropharynx is clear and moist. No oropharyngeal exudate.  Eyes: Conjunctivae are normal. Right eye exhibits no discharge. Left eye exhibits no discharge. No scleral icterus.  Neck: Normal range of motion. Neck supple.  Cardiovascular: Normal rate, regular rhythm, normal heart sounds and intact distal pulses.   Pulmonary/Chest: Effort normal and breath sounds normal. No respiratory distress. No wheezes. No rales.  Abdominal: Soft. Bowel sounds are normal. Exhibits no distension and no mass. There is no tenderness.  Musculoskeletal: Normal range of motion. Exhibits no edema.  Lymphadenopathy:    No cervical adenopathy.  Neurological: Alert and oriented to person, place, and time. Exhibits normal muscle tone. Gait normal. Coordination normal.  Skin: Skin is warm and dry. No rash noted. Not diaphoretic. No erythema. No pallor.  Psychiatric: Mood, memory and judgment normal.  Vitals reviewed.  LABORATORY DATA: Lab Results  Component Value Date   WBC 4.7 10/11/2017   HGB 13.0 10/11/2017   HCT 37.3 (L) 10/11/2017   MCV 85.8 10/11/2017   PLT 147 10/11/2017      Chemistry      Component Value Date/Time   NA 137 10/11/2017 0912   NA 136 06/21/2017 1246   K 3.7 10/11/2017 0912   K 3.6 06/21/2017 1246   CL 105 10/11/2017 0912   CO2 22 10/11/2017 0912   CO2 25 06/21/2017 1246   BUN 16 10/11/2017 0912   BUN 14.0 06/21/2017 1246   CREATININE 0.89 10/11/2017 0912   CREATININE 0.8 06/21/2017 1246      Component Value Date/Time   CALCIUM 9.8 10/11/2017 0912   CALCIUM 9.4 06/21/2017 1246   ALKPHOS 119 10/11/2017 0912   ALKPHOS 139 06/21/2017 1246   AST 24 10/11/2017 0912   AST 18 06/21/2017 1246   ALT 20 10/11/2017 0912   ALT 17 06/21/2017 1246   BILITOT 0.4 10/11/2017 0912   BILITOT 0.41 06/21/2017 1246        RADIOGRAPHIC STUDIES:  Ct Chest W Contrast  Result Date: 09/27/2017 CLINICAL DATA:  Right middle lobe lung cancer. Status post chemo and XRT. EXAM: CT CHEST WITH CONTRAST TECHNIQUE: Multidetector CT imaging of the chest was performed during intravenous contrast administration. CONTRAST:  57mL ISOVUE-300 IOPAMIDOL (ISOVUE-300) INJECTION 61% COMPARISON:  06/30/2017 FINDINGS: Cardiovascular: Normal heart size. There is aortic atherosclerosis. Previous median sternotomy and CABG procedure. Mediastinum/Nodes: The trachea appears patent and is midline. Normal appearance of the esophagus. Low right paratracheal lymph node measures 8 mm, image 62/2. Previously 5 mm. No hilar adenopathy. No axillary or supraclavicular adenopathy. Lungs/Pleura: Mild emphysema. Paramediastinal masslike architectural distortion and fibrosis is again identified compatible with changes of external beam radiation. 4 mm right upper lobe lung nodule is identified, image 38/4. Stable. 2-3 mm nodule in the right upper lobe is unchanged, image 75/4. Upper Abdomen: No acute findings. Musculoskeletal: The bones appear osteopenic. Mild spondylosis identified within the thoracic spine. Stable midthoracic spine mild compression deformities. IMPRESSION: 1. Stable appearance of radiation change in the right lung. No evidence for local tumor recurrence. 2. Small pulmonary nodules in the right lung are stable 3. Low right paratracheal lymph node measures 8 mm. On the prior exam this measures 5 mm. Although not specific for metastatic disease this area warrants attention on follow-up exam. 4. Aortic Atherosclerosis (ICD10-I70.0) and Emphysema (ICD10-J43.9). Electronically Signed   By: Kerby Moors M.D.   On: 09/27/2017 10:06     ASSESSMENT/PLAN:  T1b N2 M0 disease (stage IIIA).adenocarcinoma of the right middle lobe of lung [facility]) This is a very pleasant 77 year old white male with a stage IIIa non-small cell lung cancer, adenocarcinoma  with positive PDL 1 expression of 50%. The patient completed a course of concurrent chemoradiation with weekly carboplatin and paclitaxel status post 8 weeks and tolerated his treatment well except for mild dysphagia and odynophagia. He is currently undergoing consolidation immunotherapy with Imfinzi (Durvalumab) status post 19 cycles. The patient continues to tolerate this treatment fairly well. The patient will proceed with cycle 20 of Infinzi today as scheduled. The patient will come back for follow-up visit in 2 weeks for evaluation prior to cycle #21 of his treatment.  He was advised to call immediately if he has any concerning symptoms in the interval. The patient voices understanding of current disease status and treatment options and is in agreement with the current care plan. All questions were answered. The patient knows to call the clinic with any problems, questions or concerns. We can certainly see the patient much sooner if necessary.   Orders Placed This Encounter  Procedures  . CBC with Differential (Cancer Center Only)    Standing Status:   Standing    Number of Occurrences:   20    Standing Expiration Date:   10/12/2018  . CMP ([facility] only)    Standing Status:   Standing    Number of Occurrences:   20    Standing Expiration Date:   10/12/2018  . TSH    Standing Status:   Standing    Number of Occurrences:   12    Standing Expiration Date:   10/12/2018   Mikey Bussing, DNP, AGPCNP-BC, AOCNP 10/11/17

## 2017-10-11 NOTE — Telephone Encounter (Signed)
Scheduled appt per 4/24 los - pt to get an updated schedule next visit.

## 2017-10-17 ENCOUNTER — Other Ambulatory Visit: Payer: Self-pay

## 2017-10-17 ENCOUNTER — Encounter: Payer: Self-pay | Admitting: Vascular Surgery

## 2017-10-17 ENCOUNTER — Ambulatory Visit (HOSPITAL_COMMUNITY)
Admission: RE | Admit: 2017-10-17 | Discharge: 2017-10-17 | Disposition: A | Discharge status: Home / Self Care | Payer: Medicare Other | Source: Ambulatory Visit | Attending: Vascular Surgery | Admitting: Vascular Surgery

## 2017-10-17 ENCOUNTER — Ambulatory Visit (INDEPENDENT_AMBULATORY_CARE_PROVIDER_SITE_OTHER): Payer: Medicare Other | Admitting: Vascular Surgery

## 2017-10-17 VITALS — BP 139/71 | HR 61 | Resp 20 | Ht 67.0 in | Wt 134.0 lb

## 2017-10-17 DIAGNOSIS — I6523 Occlusion and stenosis of bilateral carotid arteries: Secondary | ICD-10-CM | POA: Diagnosis not present

## 2017-10-17 DIAGNOSIS — I6521 Occlusion and stenosis of right carotid artery: Secondary | ICD-10-CM | POA: Insufficient documentation

## 2017-10-17 DIAGNOSIS — I6529 Occlusion and stenosis of unspecified carotid artery: Secondary | ICD-10-CM | POA: Diagnosis not present

## 2017-10-17 DIAGNOSIS — Z9889 Other specified postprocedural states: Secondary | ICD-10-CM | POA: Insufficient documentation

## 2017-10-17 NOTE — Progress Notes (Signed)
Vascular and Vein Specialist of [facility]  Patient name: [name] [name] MRN: [id number] DOB: 06/13/1941 Sex: male  REASON FOR VISIT: Here today for follow-up of these.  He is status post right carotid endarterectomy by Dr. Kellie Simmering in 2005 and last left carotid endarterectomy in 2015 with Dr. Kellie Simmering.  He has known recurrent stenosis in his right carotid endarterectomy site.  HPI: GUIDO COMP is a 77 y.o. male he looks good today.  He is certainly in better stamina than my last visit with him 6 months ago.  He does have known stage III lung cancer and is continued to undergo treatment with immunotherapy.  He had a recent CT scan which showed no progression of disease.  He specifically denies any new neurologic deficits  Past Medical History:  Diagnosis Date  . Adenocarcinoma of right lung, stage 3 ([place]) 09/20/2016  . Atrial fibrillation ([facility])    no hx of reported at preop visit of 04/21/11   . CAD (coronary artery disease)   . Carotid artery occlusion    left carotid endarterectomy  . Chronic kidney disease    AAA repair with reimplant of renals   . CHRONIC OBSTRUCTIVE PULMONARY DISEASE    pt denied at visit of 04/21/11   . CORONARY ARTERY DISEASE   . Dehydration 11/15/2016  . Disorder of left sacroiliac joint 08/24/2016  . DIVERTICULOSIS OF COLON   . Encounter for antineoplastic chemotherapy 10/17/2016  . GERD   . H/O hiatal hernia   . Headache(784.0)    Hx: of years of years  . HYPERTENSION   . HYPOTHYROIDISM   . JOINT EFFUSION, KNEE   . KNEE, ARTHRITIS, DEGEN./OSTEO    right knee   . Myocardial infarction ([facility])    1994   . NEPHROLITHIASIS, HX OF   . OSTEOPOROSIS   . Other dysphagia   . PERIPHERAL VASCULAR DISEASE    AAA - 1994 with reimplant of renals   . Peripheral vascular disease ([facility])    subclavian stenosis PTA - 3/08   . Pure hypercholesterolemia   . Renal artery stenosis ([facility])     Family History  Problem Relation Age of Onset   . Hypertension Mother   . Heart disease Father   . Heart attack Father   . Diabetes Sister   . Cancer Brother 79       Lung  . Diabetes Sister     SOCIAL HISTORY: Social History   Tobacco Use  . Smoking status: Former Smoker    Types: Cigarettes    Last attempt to quit: 06/20/1992    Years since quitting: 25.3  . Smokeless tobacco: Never Used  Substance Use Topics  . Alcohol use: No    Alcohol/week: 0.0 oz    Allergies  Allergen Reactions  . Neurontin [Gabapentin] Other (See Comments)    Caused confusion  . Imitrex [Sumatriptan] Nausea And Vomiting and Other (See Comments)    "made me like I was having a stoke"  . Lipitor [Atorvastatin] Nausea And Vomiting and Other (See Comments)    INTOLERANCE > MYALGIAS "couldn't get out of the bed ( pt had to crawl out of bed ) I hurt so bad"    Current Outpatient Medications  Medication Sig Dispense Refill  . acetaminophen (TYLENOL) 325 MG tablet Take 325-650 mg by mouth every 6 (six) hours as needed for headache (for pain).     [name] ALPRAZolam (XANAX) 0.25 MG tablet Take 1 tablet (0.25 mg total) by mouth 3 (  three) times daily as needed for anxiety. 30 tablet 0  . bumetanide (BUMEX) 1 MG tablet Take 1 tablet (1 mg total) by mouth daily as needed (swelling). 30 tablet 1  . donepezil (ARICEPT) 5 MG tablet Take 1 tablet (5 mg total) by mouth at bedtime. 90 tablet 1  . lansoprazole (PREVACID) 15 MG capsule Take 1 capsule (15 mg total) by mouth daily. 30 capsule 5  . levothyroxine (SYNTHROID, LEVOTHROID) 112 MCG tablet Take 1 tablet (112 mcg total) by mouth daily. 90 tablet 1  . linaclotide (LINZESS) 145 MCG CAPS capsule Take 1 capsule (145 mcg total) by mouth daily before breakfast. 30 capsule 5  . magic mouthwash w/lidocaine SOLN Composition: 80 cc each of Extra St. Maalox Plus, Diphenhydramine, and Nystatin. Plus 240 cc of 2% viscous lidocaine to make 480 cc. Swish and swallow 2 tsp four times per day. 480 mL 2  . metoprolol tartrate  (LOPRESSOR) 25 MG tablet TAKE ONE-HALF TABLET BY MOUTH TWO TIMES DAILY 30 tablet 5  . niacin (SLO-NIACIN) 500 MG tablet Take 3 tablets (1,500 mg total) by mouth at bedtime. 30 tablet 0  . nitroGLYCERIN (NITROSTAT) 0.4 MG SL tablet Place 1 tablet (0.4 mg total) under the tongue every 5 (five) minutes as needed for chest pain. 25 tablet 10  . ondansetron (ZOFRAN) 8 MG tablet Take 1 tablet (8 mg total) by mouth every 8 (eight) hours as needed for nausea or vomiting. 20 tablet 0  . potassium chloride SA (K-DUR,KLOR-CON) 20 MEQ tablet Take 1 tablet (20 mEq total) by mouth daily. 90 tablet 0  . prochlorperazine (COMPAZINE) 10 MG tablet Take 1 tablet (10 mg total) by mouth every 6 (six) hours as needed for nausea or vomiting. 30 tablet 0  . rosuvastatin (CRESTOR) 20 MG tablet Take 1 tablet (20 mg total) by mouth daily. 90 tablet 0  . sucralfate (CARAFATE) 1 GM/10ML suspension Take 10 mLs (1 g total) by mouth 4 (four) times daily -  with meals and at bedtime. 420 mL 1  . tamsulosin (FLOMAX) 0.4 MG CAPS capsule TAKE ONE CAPSULE BY MOUTH TWICE A DAY. 60 capsule 11  . warfarin (COUMADIN) 5 MG tablet TAKE 1 TABLET DAILY. TAKE AS DIRECTED BYPHYSICIAN 90 tablet 1   No current facility-administered medications for this visit.     REVIEW OF SYSTEMS:  [X]  denotes positive finding, [ ]  denotes negative finding Cardiac  Comments:  Chest pain or chest pressure:    Shortness of breath upon exertion: x   Short of breath when lying flat:    Irregular heart rhythm:        Vascular    Pain in calf, thigh, or hip brought on by ambulation:    Pain in feet at night that wakes you up from your sleep:     Blood clot in your veins:    Leg swelling:           PHYSICAL EXAM: Vitals:   10/17/17 1146 10/17/17 1148  BP: (!) 143/71 139/71  Pulse: 61   Resp: 20   SpO2: 98%   Weight: 134 lb (60.8 kg)   Height: 5\' 7"  (1.702 m)     GENERAL: The patient is a well-nourished male, in no acute distress. The vital signs  are documented above. CARDIOVASCULAR: Harsh right carotid bruit and no left carotid bruit.  Well-healed neck incisions bilaterally. PULMONARY: There is good air exchange  MUSCULOSKELETAL: There are no major deformities or cyanosis. NEUROLOGIC: No focal weakness or paresthesias  are detected. SKIN: There are no ulcers or rashes noted. PSYCHIATRIC: The patient has a normal affect.  DATA:  Flex today shows no significant change in his 60 to 79% right internal carotid artery stenosis.  No significant stenosis in his left carotid  MEDICAL ISSUES: Had a long discussion with the patient and his wife present.  I explained that he is at the threshold for repair of his recurrent carotid disease.  I do not feel that he has any statistical benefit of surgery versus maximal medical treatment over the next 6 months.  I would recommend seeing him again with duplex in 6 months.  He will continue with his immunotherapy for lung cancer for several additional treatments.  If he has symptoms we would recommend treatment.  I did review symptoms of right carotid stenosis.  Otherwise we will see him with repeat duplex in 6 months    Rosetta Posner, MD [facility] Vascular and Vein Specialists of [facility] Tel [phone] Pager [phone]

## 2017-10-25 ENCOUNTER — Inpatient Hospital Stay (HOSPITAL_BASED_OUTPATIENT_CLINIC_OR_DEPARTMENT_OTHER): Payer: Medicare Other | Admitting: Internal Medicine

## 2017-10-25 ENCOUNTER — Encounter: Payer: Self-pay | Admitting: Internal Medicine

## 2017-10-25 ENCOUNTER — Inpatient Hospital Stay: Payer: Medicare Other

## 2017-10-25 ENCOUNTER — Telehealth: Payer: Self-pay | Admitting: Internal Medicine

## 2017-10-25 ENCOUNTER — Inpatient Hospital Stay: Payer: Medicare Other | Attending: Nurse Practitioner

## 2017-10-25 VITALS — BP 129/61 | HR 64 | Temp 97.9°F | Resp 18 | Ht 67.0 in | Wt 132.8 lb

## 2017-10-25 DIAGNOSIS — R1310 Dysphagia, unspecified: Secondary | ICD-10-CM | POA: Diagnosis not present

## 2017-10-25 DIAGNOSIS — C3412 Malignant neoplasm of upper lobe, left bronchus or lung: Secondary | ICD-10-CM | POA: Diagnosis not present

## 2017-10-25 DIAGNOSIS — Z5112 Encounter for antineoplastic immunotherapy: Secondary | ICD-10-CM

## 2017-10-25 DIAGNOSIS — C3411 Malignant neoplasm of upper lobe, right bronchus or lung: Secondary | ICD-10-CM | POA: Insufficient documentation

## 2017-10-25 DIAGNOSIS — C342 Malignant neoplasm of middle lobe, bronchus or lung: Secondary | ICD-10-CM

## 2017-10-25 DIAGNOSIS — E039 Hypothyroidism, unspecified: Secondary | ICD-10-CM | POA: Insufficient documentation

## 2017-10-25 LAB — COMPREHENSIVE METABOLIC PANEL
ALBUMIN: 3.8 g/dL (ref 3.5–5.0)
ALT: 23 U/L (ref 0–55)
ANION GAP: 5 (ref 3–11)
AST: 29 U/L (ref 5–34)
Alkaline Phosphatase: 123 U/L (ref 40–150)
BUN: 15 mg/dL (ref 7–26)
CO2: 25 mmol/L (ref 22–29)
Calcium: 9.6 mg/dL (ref 8.4–10.4)
Chloride: 105 mmol/L (ref 98–109)
Creatinine, Ser: 0.85 mg/dL (ref 0.70–1.30)
GFR calc Af Amer: >60 mL/min (ref >60)
GFR calc non Af Amer: >60 mL/min (ref >60)
GLUCOSE: 85 mg/dL (ref 70–140)
POTASSIUM: 4.1 mmol/L (ref 3.5–5.1)
SODIUM: 135 mmol/L — AB (ref 136–145)
TOTAL PROTEIN: 6.8 g/dL (ref 6.4–8.3)
Total Bilirubin: 0.5 mg/dL (ref 0.2–1.2)

## 2017-10-25 LAB — CBC WITH DIFFERENTIAL (CANCER CENTER ONLY)
BASOS ABS: 0 10*3/uL (ref 0.0–0.1)
BASOS PCT: 1 %
Eosinophils Absolute: 0.1 10*3/uL (ref 0.0–0.5)
Eosinophils Relative: 3 %
HEMATOCRIT: 37.7 % — AB (ref 38.4–49.9)
Hemoglobin: 12.9 g/dL — ABNORMAL LOW (ref 13.0–17.1)
Lymphocytes Relative: 25 %
Lymphs Abs: 1.1 10*3/uL (ref 0.9–3.3)
MCH: 29.8 pg (ref 27.2–33.4)
MCHC: 34.2 g/dL (ref 32.0–36.0)
MCV: 87.1 fL (ref 79.3–98.0)
Monocytes Absolute: 0.3 10*3/uL (ref 0.1–0.9)
Monocytes Relative: 6 %
NEUTROS ABS: 2.8 10*3/uL (ref 1.5–6.5)
Neutrophils Relative %: 65 %
Platelet Count: 100 10*3/uL — ABNORMAL LOW (ref 140–400)
RBC: 4.33 MIL/uL (ref 4.20–5.82)
RDW: 15.2 % — AB (ref 11.0–14.6)
WBC Count: 4.3 10*3/uL (ref 4.0–10.3)

## 2017-10-25 LAB — TSH: TSH: 0.696 u[IU]/mL (ref 0.320–4.118)

## 2017-10-25 MED ORDER — SODIUM CHLORIDE 0.9 % IV SOLN
Freq: Once | INTRAVENOUS | Status: AC
  Administered 2017-10-25: 11:00:00 via INTRAVENOUS

## 2017-10-25 MED ORDER — SODIUM CHLORIDE 0.9 % IV SOLN
10.0000 mg/kg | Freq: Once | INTRAVENOUS | Status: AC
  Administered 2017-10-25: 620 mg via INTRAVENOUS
  Filled 2017-10-25: qty 10

## 2017-10-25 NOTE — Patient Instructions (Signed)
[facility] Discharge Instructions for Patients Receiving Chemotherapy  Today you received the following chemotherapy agents: Imfinzi.  To help prevent nausea and vomiting after your treatment, we encourage you to take your nausea medication as directed.   If you develop nausea and vomiting that is not controlled by your nausea medication, call the clinic.   BELOW ARE SYMPTOMS THAT SHOULD BE REPORTED IMMEDIATELY:  *FEVER GREATER THAN 100.5 F  *CHILLS WITH OR WITHOUT FEVER  NAUSEA AND VOMITING THAT IS NOT CONTROLLED WITH YOUR NAUSEA MEDICATION  *UNUSUAL SHORTNESS OF BREATH  *UNUSUAL BRUISING OR BLEEDING  TENDERNESS IN MOUTH AND THROAT WITH OR WITHOUT PRESENCE OF ULCERS  *URINARY PROBLEMS  *BOWEL PROBLEMS  UNUSUAL RASH Items with * indicate a potential emergency and should be followed up as soon as possible.  Feel free to call the clinic should you have any questions or concerns. The clinic phone number is [phone].  Please show the CHEMO ALERT CARD at check-in to the Emergency Department and triage nurse.   

## 2017-10-25 NOTE — Progress Notes (Signed)
[name] [name] Telephone:([phone]   Fax:(336) [phone]  OFFICE PROGRESS NOTE  [name] Kirschner, MD [address] [place] [address]  DIAGNOSIS: Stage IIIA (T1b, N2, M0) non-small cell lung cancer, adenocarcinoma with positive PDL 1 expression 50% diagnosed in March 2018 and presented with right upper lobe lung nodule in addition to mediastinal lymphadenopathy.  PRIOR THERAPY: Course of concurrent chemoradiation with weekly carboplatin for AUC of 2 and paclitaxel 45 MG/M2. First dose of chemotherapy 10/17/2016. Status post 8 cycles. Last dose was given 12/05/2016 with partial response.[name]  CURRENT THERAPY: Consolidation treatment with immunotherapy with Imfinzi (Durvalumab) 10 MG/KG every 2 weeks. First dose 01/18/2017. Status post 20 cycles.  INTERVAL HISTORY: [name] [name] 77 y.o. male returns to the clinic today for follow-up visit accompanied by his wife.  The patient continues to tolerate his treatment with immunotherapy fairly well.  He denied having any skin rash or diarrhea.  He has no chest pain, shortness of breath, cough or hemoptysis.  He has no fever or chills.  He denied having any nausea, vomiting or constipation.  He is here for evaluation before starting cycle #21.  MEDICAL HISTORY: Past Medical History:  Diagnosis Date  . Adenocarcinoma of right lung, stage 3 ([facility]) 09/20/2016  . Atrial fibrillation ([facility])    no hx of reported at preop visit of 04/21/11   . CAD (coronary artery disease)   . Carotid artery occlusion    left carotid endarterectomy  . Chronic kidney disease    AAA repair with reimplant of renals   . CHRONIC OBSTRUCTIVE PULMONARY DISEASE    pt denied at visit of 04/21/11   . CORONARY ARTERY DISEASE   . Dehydration 11/15/2016  . Disorder of left sacroiliac joint 08/24/2016  . DIVERTICULOSIS OF COLON   . Encounter for antineoplastic chemotherapy 10/17/2016  . GERD   . H/O hiatal hernia   . Headache(784.0)    Hx: of years of  years  . HYPERTENSION   . HYPOTHYROIDISM   . JOINT EFFUSION, KNEE   . KNEE, ARTHRITIS, DEGEN./OSTEO    right knee   . Myocardial infarction ([facility])    1994   . NEPHROLITHIASIS, HX OF   . OSTEOPOROSIS   . Other dysphagia   . PERIPHERAL VASCULAR DISEASE    AAA - 1994 with reimplant of renals   . Peripheral vascular disease ([facility])    subclavian stenosis PTA - 3/08   . Pure hypercholesterolemia   . Renal artery stenosis ([facility])     ALLERGIES:  is allergic to neurontin [gabapentin]; imitrex [sumatriptan]; and lipitor [atorvastatin].  MEDICATIONS:  Current Outpatient Medications  Medication Sig Dispense Refill  . acetaminophen (TYLENOL) 325 MG tablet Take 325-650 mg by mouth every 6 (six) hours as needed for headache (for pain).     [name] ALPRAZolam (XANAX) 0.25 MG tablet Take 1 tablet (0.25 mg total) by mouth 3 (three) times daily as needed for anxiety. 30 tablet 0  . bumetanide (BUMEX) 1 MG tablet Take 1 tablet (1 mg total) by mouth daily as needed (swelling). 30 tablet 1  . donepezil (ARICEPT) 5 MG tablet Take 1 tablet (5 mg total) by mouth at bedtime. 90 tablet 1  . lansoprazole (PREVACID) 15 MG capsule Take 1 capsule (15 mg total) by mouth daily. 30 capsule 5  . levothyroxine (SYNTHROID, LEVOTHROID) 112 MCG tablet Take 1 tablet (112 mcg total) by mouth daily. 90 tablet 1  . linaclotide (LINZESS) 145 MCG CAPS  capsule Take 1 capsule (145 mcg total) by mouth daily before breakfast. 30 capsule 5  . magic mouthwash w/lidocaine SOLN Composition: 80 cc each of Extra St. Maalox Plus, Diphenhydramine, and Nystatin. Plus 240 cc of 2% viscous lidocaine to make 480 cc. Swish and swallow 2 tsp four times per day. 480 mL 2  . metoprolol tartrate (LOPRESSOR) 25 MG tablet TAKE ONE-HALF TABLET BY MOUTH TWO TIMES DAILY 30 tablet 5  . niacin (SLO-NIACIN) 500 MG tablet Take 3 tablets (1,500 mg total) by mouth at bedtime. 30 tablet 0  . nitroGLYCERIN (NITROSTAT) 0.4 MG SL tablet Place 1 tablet (0.4 mg total)  under the tongue every 5 (five) minutes as needed for chest pain. 25 tablet 10  . ondansetron (ZOFRAN) 8 MG tablet Take 1 tablet (8 mg total) by mouth every 8 (eight) hours as needed for nausea or vomiting. 20 tablet 0  . potassium chloride SA (K-DUR,KLOR-CON) 20 MEQ tablet Take 1 tablet (20 mEq total) by mouth daily. 90 tablet 0  . prochlorperazine (COMPAZINE) 10 MG tablet Take 1 tablet (10 mg total) by mouth every 6 (six) hours as needed for nausea or vomiting. 30 tablet 0  . rosuvastatin (CRESTOR) 20 MG tablet Take 1 tablet (20 mg total) by mouth daily. 90 tablet 0  . sucralfate (CARAFATE) 1 GM/10ML suspension Take 10 mLs (1 g total) by mouth 4 (four) times daily -  with meals and at bedtime. 420 mL 1  . tamsulosin (FLOMAX) 0.4 MG CAPS capsule TAKE ONE CAPSULE BY MOUTH TWICE A DAY. 60 capsule 11  . warfarin (COUMADIN) 5 MG tablet TAKE 1 TABLET DAILY. TAKE AS DIRECTED BYPHYSICIAN 90 tablet 1   No current facility-administered medications for this visit.     SURGICAL HISTORY:  Past Surgical History:  Procedure Laterality Date  . ABDOMINAL AORTIC ANEURYSM REPAIR     with reimplantation of renals   . CARDIAC CATHETERIZATION     2008  . CAROTID ENDARTERECTOMY Left Jan. 30, 2015   CEA  . CHOLECYSTECTOMY  2000   Gall Bladder  . COLONOSCOPY W/ BIOPSIES AND POLYPECTOMY     Hx: of  . CORONARY ARTERY BYPASS GRAFT     1995  . CORONARY STENT PLACEMENT     Hx: of  . ENDARTERECTOMY Left 07/19/2013   Procedure: ENDARTERECTOMY CAROTID;  Surgeon: Mal Misty, MD;  Location: [facility];  Service: Vascular;  Laterality: Left;  . GALLBLADDER SURGERY  2004   . JOINT REPLACEMENT     partial knee replacement on left 2002   . KNEE SURGERY    . ORIF PELVIC FRACTURE Left 08/25/2016   Procedure: OPEN REDUCTION INTERNAL FIXATION (ORIF) PELVIC FRACTURE; plate on front, SI screw on the back;  Surgeon: Altamese [place], MD;  Location: [facility];  Service: Orthopedics;  Laterality: Left;  . OTHER SURGICAL HISTORY      left subclavian stenosis surgery PTA 08/2006   . OTHER SURGICAL HISTORY     carotid surgery on right 2004   . TOTAL KNEE ARTHROPLASTY  04/29/2011   Procedure: TOTAL KNEE ARTHROPLASTY;  Surgeon: Gearlean Alf;  Location: [facility];  Service: Orthopedics;  Laterality: Right;  . urinary retention    . VASCULAR SURGERY     AAA  . VASECTOMY  1973  . VIDEO BRONCHOSCOPY WITH ENDOBRONCHIAL ULTRASOUND [name] 09/09/2016   Procedure: VIDEO BRONCHOSCOPY WITH ENDOBRONCHIAL ULTRASOUND;  Surgeon: Grace Isaac, MD;  Location: [facility];  Service: Thoracic;  Laterality: [name];    REVIEW  OF SYSTEMS:  A comprehensive review of systems was negative.   PHYSICAL EXAMINATION: General appearance: alert, cooperative and no distress Head: Normocephalic, without obvious abnormality, atraumatic Neck: no adenopathy, no JVD, supple, symmetrical, trachea midline and thyroid not enlarged, symmetric, no tenderness/mass/nodules Lymph nodes: Cervical, supraclavicular, and axillary nodes normal. Resp: clear to auscultation bilaterally Back: symmetric, no curvature. ROM normal. No CVA tenderness. Cardio: regular rate and rhythm, S1, S2 normal, no murmur, click, rub or gallop GI: soft, non-tender; bowel sounds normal; no masses,  no organomegaly Extremities: extremities normal, atraumatic, no cyanosis or edema  ECOG PERFORMANCE STATUS: 1 - Symptomatic but completely ambulatory  Blood pressure 129/61, pulse 64, temperature 97.9 F (36.6 C), temperature source Oral, resp. rate 18, height 5\' 7"  (1.702 m), weight 132 lb 12.8 oz (60.2 kg), SpO2 97 %.  LABORATORY DATA: Lab Results  Component Value Date   WBC 4.3 10/25/2017   HGB 12.9 (L) 10/25/2017   HCT 37.7 (L) 10/25/2017   MCV 87.1 10/25/2017   PLT 100 (L) 10/25/2017      Chemistry      Component Value Date/Time   NA 135 (L) 10/25/2017 0844   NA 136 06/21/2017 1246   K 4.1 10/25/2017 0844   K 3.6 06/21/2017 1246   CL 105 10/25/2017 0844   CO2 25 10/25/2017 0844    CO2 25 06/21/2017 1246   BUN 15 10/25/2017 0844   BUN 14.0 06/21/2017 1246   CREATININE 0.85 10/25/2017 0844   CREATININE 0.8 06/21/2017 1246      Component Value Date/Time   CALCIUM 9.6 10/25/2017 0844   CALCIUM 9.4 06/21/2017 1246   ALKPHOS 123 10/25/2017 0844   ALKPHOS 139 06/21/2017 1246   AST 29 10/25/2017 0844   AST 18 06/21/2017 1246   ALT 23 10/25/2017 0844   ALT 17 06/21/2017 1246   BILITOT 0.5 10/25/2017 0844   BILITOT 0.41 06/21/2017 1246       RADIOGRAPHIC STUDIES: Ct Chest W Contrast  Result Date: 09/27/2017 CLINICAL DATA:  Right middle lobe lung cancer. Status post chemo and XRT. EXAM: CT CHEST WITH CONTRAST TECHNIQUE: Multidetector CT imaging of the chest was performed during intravenous contrast administration. CONTRAST:  54mL ISOVUE-300 IOPAMIDOL (ISOVUE-300) INJECTION 61% COMPARISON:  06/30/2017 FINDINGS: Cardiovascular: Normal heart size. There is aortic atherosclerosis. Previous median sternotomy and CABG procedure. Mediastinum/Nodes: The trachea appears patent and is midline. Normal appearance of the esophagus. Low right paratracheal lymph node measures 8 mm, image 62/2. Previously 5 mm. No hilar adenopathy. No axillary or supraclavicular adenopathy. Lungs/Pleura: Mild emphysema. Paramediastinal masslike architectural distortion and fibrosis is again identified compatible with changes of external beam radiation. 4 mm right upper lobe lung nodule is identified, image 38/4. Stable. 2-3 mm nodule in the right upper lobe is unchanged, image 75/4. Upper Abdomen: No acute findings. Musculoskeletal: The bones appear osteopenic. Mild spondylosis identified within the thoracic spine. Stable midthoracic spine mild compression deformities. IMPRESSION: 1. Stable appearance of radiation change in the right lung. No evidence for local tumor recurrence. 2. Small pulmonary nodules in the right lung are stable 3. Low right paratracheal lymph node measures 8 mm. On the prior exam this  measures 5 mm. Although not specific for metastatic disease this area warrants attention on follow-up exam. 4. Aortic Atherosclerosis (ICD10-I70.0) and Emphysema (ICD10-J43.9). Electronically Signed   By: Kerby Moors M.D.   On: 09/27/2017 10:06    ASSESSMENT AND PLAN:  This is a very pleasant 77 years old white male with a stage IIIa  non-small cell lung cancer, adenocarcinoma with positive PDL 1 expression of 50%. The patient completed a course of concurrent chemoradiation with weekly carboplatin and paclitaxel status post 8 weeks and tolerated his treatment well except for mild dysphagia and odynophagia. He is currently undergoing consolidation immunotherapy with Imfinzi (Durvalumab) status post 20 cycles.  He tolerated the last cycle of his treatment well. I recommended for the patient to continue his treatment with Imfinzi (Durvalumab) and he will proceed with cycle #21 today. He will come back for follow-up visit in 2 weeks for evaluation before the next cycle of his treatment. He was advised to call if he has any concerning symptoms in the interval. The patient voices understanding of current disease status and treatment options and is in agreement with the current care plan. All questions were answered. The patient knows to call the clinic with any problems, questions or concerns. We can certainly see the patient much sooner if necessary.  Disclaimer: This note was dictated with voice recognition software. Similar sounding words can inadvertently be transcribed and may not be corrected upon review.

## 2017-10-25 NOTE — Telephone Encounter (Signed)
appts already scheduled per 5/8 los.

## 2017-11-01 ENCOUNTER — Other Ambulatory Visit: Payer: Self-pay | Admitting: *Deleted

## 2017-11-02 MED ORDER — ROSUVASTATIN CALCIUM 20 MG PO TABS: 20.0000 mg | ORAL_TABLET | Freq: Every day | ORAL | 0 refills | Status: DC

## 2017-11-08 ENCOUNTER — Telehealth: Payer: Self-pay | Admitting: Internal Medicine

## 2017-11-08 ENCOUNTER — Inpatient Hospital Stay (HOSPITAL_BASED_OUTPATIENT_CLINIC_OR_DEPARTMENT_OTHER): Payer: Medicare Other | Admitting: Internal Medicine

## 2017-11-08 ENCOUNTER — Inpatient Hospital Stay: Payer: Medicare Other

## 2017-11-08 ENCOUNTER — Encounter: Payer: Self-pay | Admitting: Internal Medicine

## 2017-11-08 VITALS — BP 117/62 | HR 57 | Temp 97.8°F | Resp 19 | Ht 67.0 in | Wt 132.7 lb

## 2017-11-08 DIAGNOSIS — C3411 Malignant neoplasm of upper lobe, right bronchus or lung: Secondary | ICD-10-CM | POA: Diagnosis not present

## 2017-11-08 DIAGNOSIS — Z5112 Encounter for antineoplastic immunotherapy: Secondary | ICD-10-CM

## 2017-11-08 DIAGNOSIS — C342 Malignant neoplasm of middle lobe, bronchus or lung: Secondary | ICD-10-CM

## 2017-11-08 DIAGNOSIS — I10 Essential (primary) hypertension: Secondary | ICD-10-CM

## 2017-11-08 DIAGNOSIS — C3412 Malignant neoplasm of upper lobe, left bronchus or lung: Secondary | ICD-10-CM | POA: Diagnosis not present

## 2017-11-08 DIAGNOSIS — E039 Hypothyroidism, unspecified: Secondary | ICD-10-CM

## 2017-11-08 DIAGNOSIS — R1310 Dysphagia, unspecified: Secondary | ICD-10-CM | POA: Diagnosis not present

## 2017-11-08 DIAGNOSIS — R5383 Other fatigue: Secondary | ICD-10-CM

## 2017-11-08 LAB — CBC WITH DIFFERENTIAL (CANCER CENTER ONLY)
Basophils Absolute: 0 10*3/uL (ref 0.0–0.1)
Basophils Relative: 1 %
Eosinophils Absolute: 0.1 10*3/uL (ref 0.0–0.5)
Eosinophils Relative: 3 %
HCT: 38.2 % — ABNORMAL LOW (ref 38.4–49.9)
HEMOGLOBIN: 13.3 g/dL (ref 13.0–17.1)
LYMPHS ABS: 0.9 10*3/uL (ref 0.9–3.3)
LYMPHS PCT: 20 %
MCH: 30.2 pg (ref 27.2–33.4)
MCHC: 34.7 g/dL (ref 32.0–36.0)
MCV: 87 fL (ref 79.3–98.0)
Monocytes Absolute: 0.2 10*3/uL (ref 0.1–0.9)
Monocytes Relative: 5 %
NEUTROS PCT: 71 %
Neutro Abs: 3.1 10*3/uL (ref 1.5–6.5)
PLATELETS: 151 10*3/uL (ref 140–400)
RBC: 4.39 MIL/uL (ref 4.20–5.82)
RDW: 14.9 % — AB (ref 11.0–14.6)
WBC: 4.4 10*3/uL (ref 4.0–10.3)

## 2017-11-08 LAB — CMP (CANCER CENTER ONLY)
ALT: 21 U/L (ref 0–55)
AST: 25 U/L (ref 5–34)
Albumin: 3.7 g/dL (ref 3.5–5.0)
Alkaline Phosphatase: 116 U/L (ref 40–150)
Anion gap: 7 (ref 3–11)
BUN: 18 mg/dL (ref 7–26)
CHLORIDE: 106 mmol/L (ref 98–109)
CO2: 23 mmol/L (ref 22–29)
Calcium: 9.2 mg/dL (ref 8.4–10.4)
Creatinine: 0.83 mg/dL (ref 0.70–1.30)
Glucose, Bld: 92 mg/dL (ref 70–140)
POTASSIUM: 4 mmol/L (ref 3.5–5.1)
SODIUM: 136 mmol/L (ref 136–145)
Total Bilirubin: 0.4 mg/dL (ref 0.2–1.2)
Total Protein: 6.7 g/dL (ref 6.4–8.3)

## 2017-11-08 LAB — TSH: TSH: 0.754 u[IU]/mL (ref 0.320–4.118)

## 2017-11-08 MED ORDER — SODIUM CHLORIDE 0.9 % IV SOLN
10.0000 mg/kg | Freq: Once | INTRAVENOUS | Status: AC
  Administered 2017-11-08: 620 mg via INTRAVENOUS
  Filled 2017-11-08: qty 2.4

## 2017-11-08 MED ORDER — SODIUM CHLORIDE 0.9 % IV SOLN
Freq: Once | INTRAVENOUS | Status: AC
  Administered 2017-11-08: 12:00:00 via INTRAVENOUS

## 2017-11-08 NOTE — Progress Notes (Signed)
[facility] Telephone:(336) [phone]   Fax:(336) [phone]  OFFICE PROGRESS NOTE  [name], MD [address] [place] [address]  DIAGNOSIS: Stage IIIA (T1b, N2, M0) non-small cell lung cancer, adenocarcinoma with positive PDL 1 expression 50% diagnosed in March 2018 and presented with right upper lobe lung nodule in addition to mediastinal lymphadenopathy.  PRIOR THERAPY: Course of concurrent chemoradiation with weekly carboplatin for AUC of 2 and paclitaxel 45 MG/M2. First dose of chemotherapy 10/17/2016. Status post 8 cycles. Last dose was given 12/05/2016 with partial response.[name]  CURRENT THERAPY: Consolidation treatment with immunotherapy with Imfinzi (Durvalumab) 10 MG/KG every 2 weeks. First dose 01/18/2017. Status post 21 cycles.  INTERVAL HISTORY: [name] [name] 77 y.o. male returns to the clinic today for follow-up visit accompanied by his wife.  The patient is feeling fine today with no specific complaints.  He denied having any chest pain, shortness breath, cough or hemoptysis.  He denied having any fever or chills.  He has no nausea, vomiting, diarrhea or constipation.  He denied having any significant weight loss or night sweats.  He is here today for evaluation before starting cycle #22 of his treatment with Imfinzi (Durvalumab).  MEDICAL HISTORY: Past Medical History:  Diagnosis Date  . Adenocarcinoma of right lung, stage 3 ([facility]) 09/20/2016  . Atrial fibrillation ([facility])    no hx of reported at preop visit of 04/21/11   . CAD (coronary artery disease)   . Carotid artery occlusion    left carotid endarterectomy  . Chronic kidney disease    AAA repair with reimplant of renals   . CHRONIC OBSTRUCTIVE PULMONARY DISEASE    pt denied at visit of 04/21/11   . CORONARY ARTERY DISEASE   . Dehydration 11/15/2016  . Disorder of left sacroiliac joint 08/24/2016  . DIVERTICULOSIS OF COLON   . Encounter for antineoplastic chemotherapy 10/17/2016  .  GERD   . H/O hiatal hernia   . Headache(784.0)    Hx: of years of years  . HYPERTENSION   . HYPOTHYROIDISM   . JOINT EFFUSION, KNEE   . KNEE, ARTHRITIS, DEGEN./OSTEO    right knee   . Myocardial infarction ([facility])    1994   . NEPHROLITHIASIS, HX OF   . OSTEOPOROSIS   . Other dysphagia   . PERIPHERAL VASCULAR DISEASE    AAA - 1994 with reimplant of renals   . Peripheral vascular disease ([facility])    subclavian stenosis PTA - 3/08   . Pure hypercholesterolemia   . Renal artery stenosis ([facility])     ALLERGIES:  is allergic to neurontin [gabapentin]; imitrex [sumatriptan]; and lipitor [atorvastatin].  MEDICATIONS:  Current Outpatient Medications  Medication Sig Dispense Refill  . acetaminophen (TYLENOL) 325 MG tablet Take 325-650 mg by mouth every 6 (six) hours as needed for headache (for pain).     [name] ALPRAZolam (XANAX) 0.25 MG tablet Take 1 tablet (0.25 mg total) by mouth 3 (three) times daily as needed for anxiety. 30 tablet 0  . bumetanide (BUMEX) 1 MG tablet Take 1 tablet (1 mg total) by mouth daily as needed (swelling). 30 tablet 1  . donepezil (ARICEPT) 5 MG tablet Take 1 tablet (5 mg total) by mouth at bedtime. 90 tablet 1  . lansoprazole (PREVACID) 15 MG capsule Take 1 capsule (15 mg total) by mouth daily. 30 capsule 5  . levothyroxine (SYNTHROID, LEVOTHROID) 112 MCG tablet Take 1 tablet (112 mcg total) by mouth daily. [place]  tablet 1  . linaclotide (LINZESS) 145 MCG CAPS capsule Take 1 capsule (145 mcg total) by mouth daily before breakfast. 30 capsule 5  . magic mouthwash w/lidocaine SOLN Composition: 80 cc each of Extra St. Maalox Plus, Diphenhydramine, and Nystatin. Plus 240 cc of 2% viscous lidocaine to make 480 cc. Swish and swallow 2 tsp four times per day. 480 mL 2  . metoprolol tartrate (LOPRESSOR) 25 MG tablet TAKE ONE-HALF TABLET BY MOUTH TWO TIMES DAILY 30 tablet 5  . niacin (SLO-NIACIN) 500 MG tablet Take 3 tablets (1,500 mg total) by mouth at bedtime. 30 tablet 0  .  nitroGLYCERIN (NITROSTAT) 0.4 MG SL tablet Place 1 tablet (0.4 mg total) under the tongue every 5 (five) minutes as needed for chest pain. 25 tablet 10  . ondansetron (ZOFRAN) 8 MG tablet Take 1 tablet (8 mg total) by mouth every 8 (eight) hours as needed for nausea or vomiting. 20 tablet 0  . potassium chloride SA (K-DUR,KLOR-CON) 20 MEQ tablet Take 1 tablet (20 mEq total) by mouth daily. 90 tablet 0  . prochlorperazine (COMPAZINE) 10 MG tablet Take 1 tablet (10 mg total) by mouth every 6 (six) hours as needed for nausea or vomiting. 30 tablet 0  . rosuvastatin (CRESTOR) 20 MG tablet Take 1 tablet (20 mg total) by mouth daily. 90 tablet 0  . sucralfate (CARAFATE) 1 GM/10ML suspension Take 10 mLs (1 g total) by mouth 4 (four) times daily -  with meals and at bedtime. 420 mL 1  . tamsulosin (FLOMAX) 0.4 MG CAPS capsule TAKE ONE CAPSULE BY MOUTH TWICE A DAY. 60 capsule 11  . warfarin (COUMADIN) 5 MG tablet TAKE 1 TABLET DAILY. TAKE AS DIRECTED BYPHYSICIAN 90 tablet 1   No current facility-administered medications for this visit.     SURGICAL HISTORY:  Past Surgical History:  Procedure Laterality Date  . ABDOMINAL AORTIC ANEURYSM REPAIR     with reimplantation of renals   . CARDIAC CATHETERIZATION     2008  . CAROTID ENDARTERECTOMY Left Jan. 30, 2015   CEA  . CHOLECYSTECTOMY  2000   Gall Bladder  . COLONOSCOPY W/ BIOPSIES AND POLYPECTOMY     Hx: of  . CORONARY ARTERY BYPASS GRAFT     1995  . CORONARY STENT PLACEMENT     Hx: of  . ENDARTERECTOMY Left 07/19/2013   Procedure: ENDARTERECTOMY CAROTID;  Surgeon: Mal Misty, MD;  Location: [facility];  Service: Vascular;  Laterality: Left;  . GALLBLADDER SURGERY  2004   . JOINT REPLACEMENT     partial knee replacement on left 2002   . KNEE SURGERY    . ORIF PELVIC FRACTURE Left 08/25/2016   Procedure: OPEN REDUCTION INTERNAL FIXATION (ORIF) PELVIC FRACTURE; plate on front, SI screw on the back;  Surgeon: Altamese , MD;  Location: [facility];   Service: Orthopedics;  Laterality: Left;  . OTHER SURGICAL HISTORY     left subclavian stenosis surgery PTA 08/2006   . OTHER SURGICAL HISTORY     carotid surgery on right 2004   . TOTAL KNEE ARTHROPLASTY  04/29/2011   Procedure: TOTAL KNEE ARTHROPLASTY;  Surgeon: Gearlean Alf;  [facility];  Service: Orthopedics;  Laterality: Right;  . urinary retention    . VASCULAR SURGERY     AAA  . VASECTOMY  1973  . VIDEO BRONCHOSCOPY WITH ENDOBRONCHIAL ULTRASOUND [name] 09/09/2016   Procedure: VIDEO BRONCHOSCOPY WITH ENDOBRONCHIAL ULTRASOUND;  Surgeon: Grace Isaac, MD;  Location: [facility];  Service: Thoracic;  Laterality: [name];    REVIEW OF SYSTEMS:  A comprehensive review of systems was negative.   PHYSICAL EXAMINATION: General appearance: alert, cooperative and no distress Head: Normocephalic, without obvious abnormality, atraumatic Neck: no adenopathy, no JVD, supple, symmetrical, trachea midline and thyroid not enlarged, symmetric, no tenderness/mass/nodules Lymph nodes: Cervical, supraclavicular, and axillary nodes normal. Resp: clear to auscultation bilaterally Back: symmetric, no curvature. ROM normal. No CVA tenderness. Cardio: regular rate and rhythm, S1, S2 normal, no murmur, click, rub or gallop GI: soft, non-tender; bowel sounds normal; no masses,  no organomegaly Extremities: extremities normal, atraumatic, no cyanosis or edema  ECOG PERFORMANCE STATUS: 1 - Symptomatic but completely ambulatory  Blood pressure 117/62, pulse (!) 57, temperature 97.8 F (36.6 C), temperature source Oral, resp. rate 19, height 5\' 7"  (1.702 m), weight 132 lb 11.2 oz (60.2 kg), SpO2 100 %.  LABORATORY DATA: Lab Results  Component Value Date   WBC 4.4 11/08/2017   HGB 13.3 11/08/2017   HCT 38.2 (L) 11/08/2017   MCV 87.0 11/08/2017   PLT 151 11/08/2017      Chemistry      Component Value Date/Time   NA 136 11/08/2017 0854   NA 136 06/21/2017 1246   K 4.0 11/08/2017 0854   K 3.6  06/21/2017 1246   CL 106 11/08/2017 0854   CO2 23 11/08/2017 0854   CO2 25 06/21/2017 1246   BUN 18 11/08/2017 0854   BUN 14.0 06/21/2017 1246   CREATININE 0.83 11/08/2017 0854   CREATININE 0.8 06/21/2017 1246      Component Value Date/Time   CALCIUM 9.2 11/08/2017 0854   CALCIUM 9.4 06/21/2017 1246   ALKPHOS 116 11/08/2017 0854   ALKPHOS 139 06/21/2017 1246   AST 25 11/08/2017 0854   AST 18 06/21/2017 1246   ALT 21 11/08/2017 0854   ALT 17 06/21/2017 1246   BILITOT 0.4 11/08/2017 0854   BILITOT 0.41 06/21/2017 1246       RADIOGRAPHIC STUDIES: No results found.  ASSESSMENT AND PLAN:  This is a very pleasant 77 years old white male with a stage IIIa non-small cell lung cancer, adenocarcinoma with positive PDL 1 expression of 50%. The patient completed a course of concurrent chemoradiation with weekly carboplatin and paclitaxel status post 8 weeks and tolerated his treatment well except for mild dysphagia and odynophagia. He is currently undergoing consolidation immunotherapy with Imfinzi (Durvalumab) status post 21 cycles.  The patient continues to tolerate his treatment fairly well with no concerning complaints.  I recommended for him to proceed with cycle #22 today as a scheduled. I will see him back for follow-up visit in 2 weeks for evaluation before starting cycle #23. He was advised to call immediately if he has any concerning symptoms in the interval. The patient voices understanding of current disease status and treatment options and is in agreement with the current care plan. All questions were answered. The patient knows to call the clinic with any problems, questions or concerns. We can certainly see the patient much sooner if necessary.  Disclaimer: This note was dictated with voice recognition software. Similar sounding words can inadvertently be transcribed and may not be corrected upon review.

## 2017-11-08 NOTE — Telephone Encounter (Signed)
Appts already scheduled per 5/22 los.

## 2017-11-08 NOTE — Patient Instructions (Signed)
[facility] Discharge Instructions for Patients Receiving Chemotherapy  Today you received the following chemotherapy agents:  Imfinzi (durvalumab)  To help prevent nausea and vomiting after your treatment, we encourage you to take your nausea medication as prescribed.   If you develop nausea and vomiting that is not controlled by your nausea medication, call the clinic.   BELOW ARE SYMPTOMS THAT SHOULD BE REPORTED IMMEDIATELY:  *FEVER GREATER THAN 100.5 F  *CHILLS WITH OR WITHOUT FEVER  NAUSEA AND VOMITING THAT IS NOT CONTROLLED WITH YOUR NAUSEA MEDICATION  *UNUSUAL SHORTNESS OF BREATH  *UNUSUAL BRUISING OR BLEEDING  TENDERNESS IN MOUTH AND THROAT WITH OR WITHOUT PRESENCE OF ULCERS  *URINARY PROBLEMS  *BOWEL PROBLEMS  UNUSUAL RASH Items with * indicate a potential emergency and should be followed up as soon as possible.  Feel free to call the clinic should you have any questions or concerns. The clinic phone number is [phone].  Please show the [facility] at check-in to the Emergency Department and triage nurse.

## 2017-11-09 ENCOUNTER — Ambulatory Visit (INDEPENDENT_AMBULATORY_CARE_PROVIDER_SITE_OTHER): Payer: Medicare Other

## 2017-11-09 DIAGNOSIS — Z7901 Long term (current) use of anticoagulants: Secondary | ICD-10-CM

## 2017-11-09 LAB — POCT INR: INR: 2.2 (ref 2.0–3.0)

## 2017-11-09 NOTE — Patient Instructions (Signed)
Take 1.5 tablet on Sunday Tuesday and Friday. Take one tablet on Monday, Wednesday, Thursday and Saturday

## 2017-11-22 ENCOUNTER — Inpatient Hospital Stay: Payer: Medicare Other

## 2017-11-22 ENCOUNTER — Inpatient Hospital Stay: Payer: Medicare Other | Attending: Nurse Practitioner

## 2017-11-22 ENCOUNTER — Encounter: Payer: Self-pay | Admitting: Oncology

## 2017-11-22 ENCOUNTER — Inpatient Hospital Stay (HOSPITAL_BASED_OUTPATIENT_CLINIC_OR_DEPARTMENT_OTHER): Payer: Medicare Other | Admitting: Oncology

## 2017-11-22 VITALS — BP 124/56 | HR 61 | Temp 97.9°F | Resp 18 | Ht 67.0 in | Wt 132.5 lb

## 2017-11-22 VITALS — BP 124/76 | HR 63 | Temp 97.8°F | Resp 18 | Ht 67.0 in | Wt 132.7 lb

## 2017-11-22 DIAGNOSIS — C3411 Malignant neoplasm of upper lobe, right bronchus or lung: Secondary | ICD-10-CM | POA: Diagnosis not present

## 2017-11-22 DIAGNOSIS — Z5112 Encounter for antineoplastic immunotherapy: Secondary | ICD-10-CM | POA: Insufficient documentation

## 2017-11-22 DIAGNOSIS — C342 Malignant neoplasm of middle lobe, bronchus or lung: Secondary | ICD-10-CM

## 2017-11-22 DIAGNOSIS — Z79899 Other long term (current) drug therapy: Secondary | ICD-10-CM | POA: Diagnosis not present

## 2017-11-22 LAB — CMP (CANCER CENTER ONLY)
ALT: 31 U/L (ref 0–55)
ANION GAP: 9 (ref 3–11)
AST: 41 U/L — AB (ref 5–34)
Albumin: 3.9 g/dL (ref 3.5–5.0)
Alkaline Phosphatase: 125 U/L (ref 40–150)
BILIRUBIN TOTAL: 0.4 mg/dL (ref 0.2–1.2)
BUN: 16 mg/dL (ref 7–26)
CHLORIDE: 103 mmol/L (ref 98–109)
CO2: 21 mmol/L — ABNORMAL LOW (ref 22–29)
Calcium: 9.5 mg/dL (ref 8.4–10.4)
Creatinine: 0.81 mg/dL (ref 0.70–1.30)
Glucose, Bld: 122 mg/dL (ref 70–140)
POTASSIUM: 4.4 mmol/L (ref 3.5–5.1)
Sodium: 133 mmol/L — ABNORMAL LOW (ref 136–145)
TOTAL PROTEIN: 7 g/dL (ref 6.4–8.3)

## 2017-11-22 LAB — CBC WITH DIFFERENTIAL (CANCER CENTER ONLY)
BASOS ABS: 0.1 10*3/uL (ref 0.0–0.1)
Basophils Relative: 1 %
EOS PCT: 2 %
Eosinophils Absolute: 0.1 10*3/uL (ref 0.0–0.5)
HEMATOCRIT: 39.1 % (ref 38.4–49.9)
Hemoglobin: 13.6 g/dL (ref 13.0–17.1)
LYMPHS PCT: 17 %
Lymphs Abs: 0.8 10*3/uL — ABNORMAL LOW (ref 0.9–3.3)
MCH: 30.2 pg (ref 27.2–33.4)
MCHC: 34.8 g/dL (ref 32.0–36.0)
MCV: 86.6 fL (ref 79.3–98.0)
Monocytes Absolute: 0.3 10*3/uL (ref 0.1–0.9)
Monocytes Relative: 6 %
NEUTROS ABS: 3.3 10*3/uL (ref 1.5–6.5)
Neutrophils Relative %: 74 %
PLATELETS: 132 10*3/uL — AB (ref 140–400)
RBC: 4.52 MIL/uL (ref 4.20–5.82)
RDW: 15.2 % — AB (ref 11.0–14.6)
WBC Count: 4.4 10*3/uL (ref 4.0–10.3)

## 2017-11-22 MED ORDER — SODIUM CHLORIDE 0.9 % IV SOLN
10.0000 mg/kg | Freq: Once | INTRAVENOUS | Status: AC
  Administered 2017-11-22: 620 mg via INTRAVENOUS
  Filled 2017-11-22: qty 10

## 2017-11-22 MED ORDER — SODIUM CHLORIDE 0.9 % IV SOLN
Freq: Once | INTRAVENOUS | Status: AC
  Administered 2017-11-22: 16:00:00 via INTRAVENOUS

## 2017-11-22 NOTE — Assessment & Plan Note (Addendum)
This is a very pleasant 77 year old white male with a stage IIIa non-small cell lung cancer, adenocarcinoma with positive PDL 1 expression of 50%. The patient completed a course of concurrent chemoradiation with weekly carboplatin and paclitaxel status post 8 weeks and tolerated his treatment well except for mild dysphagia and odynophagia. He is currently undergoing consolidation immunotherapy with Imfinzi (Durvalumab) status post 22 cycles.  The patient continues to tolerate his treatment fairly well with no concerning complaints.  I recommended for him to proceed with cycle #23 today as a scheduled. I will see him back for follow-up visit in 2 weeks for evaluation before starting cycle #23. He was advised to call immediately if he has any concerning symptoms in the interval. The patient voices understanding of current disease status and treatment options and is in agreement with the current care plan. All questions were answered. The patient knows to call the clinic with any problems, questions or concerns. We can certainly see the patient much sooner if necessary.

## 2017-11-22 NOTE — Progress Notes (Signed)
[facility] OFFICE PROGRESS NOTE  [name] Kirschner, MD [address] [place] [address]  DIAGNOSIS:  Stage IIIA (T1b, N2, M0) non-small cell lung cancer, adenocarcinoma with positive PDL 1 expression 50% diagnosed in March 2018 and presented with right upper lobe lung nodule in addition to mediastinal lymphadenopathy.  PRIOR THERAPY:Course of concurrent chemoradiation with weekly carboplatin for AUC of 2 and paclitaxel 45 MG/M2. First dose of chemotherapy 10/17/2016. Status post 8 cycles. Last dose was given 12/05/2016 with partial response.  CURRENT THERAPY: Consolidation treatment with immunotherapy with Imfinzi (Durvalumab) 10 MG/KG every 2 weeks. First dose 01/18/2017. Status post 22 cycles.  INTERVAL HISTORY: [name] [name] 77 y.o. male returns for routine follow-up visit accompanied by his wife.  The patient is feeling fine today with no specific complaints.  The patient denies fevers and chills.  Denies chest pain, shortness of breath, cough, hemoptysis.  Denies nausea, vomiting, constipation, diarrhea.  He denies recent weight loss or night sweats.  The patient is here for evaluation prior to cycle #23 of his treatment with Imfinzi.  MEDICAL HISTORY: Past Medical History:  Diagnosis Date  . Adenocarcinoma of right lung, stage 3 ([place]) 09/20/2016  . Atrial fibrillation ([facility])    no hx of reported at preop visit of 04/21/11   . CAD (coronary artery disease)   . Carotid artery occlusion    left carotid endarterectomy  . Chronic kidney disease    AAA repair with reimplant of renals   . CHRONIC OBSTRUCTIVE PULMONARY DISEASE    pt denied at visit of 04/21/11   . CORONARY ARTERY DISEASE   . Dehydration 11/15/2016  . Disorder of left sacroiliac joint 08/24/2016  . DIVERTICULOSIS OF COLON   . Encounter for antineoplastic chemotherapy 10/17/2016  . GERD   . H/O hiatal hernia   . Headache(784.0)    Hx: of years of years  . HYPERTENSION   . HYPOTHYROIDISM   .  JOINT EFFUSION, KNEE   . KNEE, ARTHRITIS, DEGEN./OSTEO    right knee   . Myocardial infarction ([facility])    1994   . NEPHROLITHIASIS, HX OF   . OSTEOPOROSIS   . Other dysphagia   . PERIPHERAL VASCULAR DISEASE    AAA - 1994 with reimplant of renals   . Peripheral vascular disease ([facility])    subclavian stenosis PTA - 3/08   . Pure hypercholesterolemia   . Renal artery stenosis ([facility])     ALLERGIES:  is allergic to neurontin [gabapentin]; imitrex [sumatriptan]; and lipitor [atorvastatin].  MEDICATIONS:  Current Outpatient Medications  Medication Sig Dispense Refill  . acetaminophen (TYLENOL) 325 MG tablet Take 325-650 mg by mouth every 6 (six) hours as needed for headache (for pain).     [name] ALPRAZolam (XANAX) 0.25 MG tablet Take 1 tablet (0.25 mg total) by mouth 3 (three) times daily as needed for anxiety. 30 tablet 0  . bumetanide (BUMEX) 1 MG tablet Take 1 tablet (1 mg total) by mouth daily as needed (swelling). 30 tablet 1  . donepezil (ARICEPT) 5 MG tablet Take 1 tablet (5 mg total) by mouth at bedtime. 90 tablet 1  . lansoprazole (PREVACID) 15 MG capsule Take 1 capsule (15 mg total) by mouth daily. 30 capsule 5  . levothyroxine (SYNTHROID, LEVOTHROID) 112 MCG tablet Take 1 tablet (112 mcg total) by mouth daily. 90 tablet 1  . linaclotide (LINZESS) 145 MCG CAPS capsule Take 1 capsule (145 mcg total) by mouth daily before breakfast. 30 capsule 5  .  magic mouthwash w/lidocaine SOLN Composition: 80 cc each of Extra St. Maalox Plus, Diphenhydramine, and Nystatin. Plus 240 cc of 2% viscous lidocaine to make 480 cc. Swish and swallow 2 tsp four times per day. 480 mL 2  . metoprolol tartrate (LOPRESSOR) 25 MG tablet TAKE ONE-HALF TABLET BY MOUTH TWO TIMES DAILY 30 tablet 5  . niacin (SLO-NIACIN) 500 MG tablet Take 3 tablets (1,500 mg total) by mouth at bedtime. 30 tablet 0  . nitroGLYCERIN (NITROSTAT) 0.4 MG SL tablet Place 1 tablet (0.4 mg total) under the tongue every 5 (five) minutes as needed  for chest pain. 25 tablet 10  . ondansetron (ZOFRAN) 8 MG tablet Take 1 tablet (8 mg total) by mouth every 8 (eight) hours as needed for nausea or vomiting. 20 tablet 0  . potassium chloride SA (K-DUR,KLOR-CON) 20 MEQ tablet Take 1 tablet (20 mEq total) by mouth daily. 90 tablet 0  . prochlorperazine (COMPAZINE) 10 MG tablet Take 1 tablet (10 mg total) by mouth every 6 (six) hours as needed for nausea or vomiting. 30 tablet 0  . rosuvastatin (CRESTOR) 20 MG tablet Take 1 tablet (20 mg total) by mouth daily. 90 tablet 0  . sucralfate (CARAFATE) 1 GM/10ML suspension Take 10 mLs (1 g total) by mouth 4 (four) times daily -  with meals and at bedtime. 420 mL 1  . tamsulosin (FLOMAX) 0.4 MG CAPS capsule TAKE ONE CAPSULE BY MOUTH TWICE A DAY. 60 capsule 11  . warfarin (COUMADIN) 5 MG tablet TAKE 1 TABLET DAILY. TAKE AS DIRECTED BYPHYSICIAN 90 tablet 1   No current facility-administered medications for this visit.    Facility-Administered Medications Ordered in Other Visits  Medication Dose Route Frequency Provider Last Rate Last Dose  . 0.9 %  sodium chloride infusion   Intravenous Once Curt Bears, MD      . durvalumab (IMFINZI) 620 mg in sodium chloride 0.9 % 100 mL chemo infusion  10 mg/kg (Treatment Plan Recorded) Intravenous Once Curt Bears, MD        SURGICAL HISTORY:  Past Surgical History:  Procedure Laterality Date  . ABDOMINAL AORTIC ANEURYSM REPAIR     with reimplantation of renals   . CARDIAC CATHETERIZATION     2008  . CAROTID ENDARTERECTOMY Left Jan. 30, 2015   CEA  . CHOLECYSTECTOMY  2000   Gall Bladder  . COLONOSCOPY W/ BIOPSIES AND POLYPECTOMY     Hx: of  . CORONARY ARTERY BYPASS GRAFT     1995  . CORONARY STENT PLACEMENT     Hx: of  . ENDARTERECTOMY Left 07/19/2013   Procedure: ENDARTERECTOMY CAROTID;  Surgeon: Mal Misty, MD;  Location: [facility];  Service: Vascular;  Laterality: Left;  . GALLBLADDER SURGERY  2004   . JOINT REPLACEMENT     partial knee  replacement on left 2002   . KNEE SURGERY    . ORIF PELVIC FRACTURE Left 08/25/2016   Procedure: OPEN REDUCTION INTERNAL FIXATION (ORIF) PELVIC FRACTURE; plate on front, SI screw on the back;  Surgeon: Altamese Brownsburg, MD;  Location: [facility];  Service: Orthopedics;  Laterality: Left;  . OTHER SURGICAL HISTORY     left subclavian stenosis surgery PTA 08/2006   . OTHER SURGICAL HISTORY     carotid surgery on right 2004   . TOTAL KNEE ARTHROPLASTY  04/29/2011   Procedure: TOTAL KNEE ARTHROPLASTY;  Surgeon: Gearlean Alf;  Location: [facility];  Service: Orthopedics;  Laterality: Right;  . urinary retention    .  VASCULAR SURGERY     AAA  . VASECTOMY  1973  . VIDEO BRONCHOSCOPY WITH ENDOBRONCHIAL ULTRASOUND [name] 09/09/2016   Procedure: VIDEO BRONCHOSCOPY WITH ENDOBRONCHIAL ULTRASOUND;  Surgeon: Grace Isaac, MD;  Location: [facility];  Service: Thoracic;  Laterality: [name];    REVIEW OF SYSTEMS:   Review of Systems  Constitutional: Negative for appetite change, chills, fatigue, fever and unexpected weight change.  HENT:   Negative for mouth sores, nosebleeds, sore throat and trouble swallowing.   Eyes: Negative for eye problems and icterus.  Respiratory: Negative for cough, hemoptysis, shortness of breath and wheezing.   Cardiovascular: Negative for chest pain and leg swelling.  Gastrointestinal: Negative for abdominal pain, constipation, diarrhea, nausea and vomiting.  Genitourinary: Negative for bladder incontinence, difficulty urinating, dysuria, frequency and hematuria.   Musculoskeletal: Negative for back pain, gait problem, neck pain and neck stiffness.  Skin: Negative for itching and rash.  Neurological: Negative for dizziness, extremity weakness, gait problem, headaches, light-headedness and seizures.  Hematological: Negative for adenopathy. Does not bruise/bleed easily.  Psychiatric/Behavioral: Negative for confusion, depression and sleep disturbance. The patient is not nervous/anxious.      PHYSICAL EXAMINATION:  Blood pressure (!) 124/56, pulse 61, temperature 97.9 F (36.6 C), temperature source Oral, resp. rate 18, height 5\' 7"  (1.702 m), weight 132 lb 8 oz (60.1 kg), SpO2 99 %.  ECOG PERFORMANCE STATUS: 1 - Symptomatic but completely ambulatory  Physical Exam  Constitutional: Oriented to person, place, and time and well-developed, well-nourished, and in no distress. No distress.  HENT:  Head: Normocephalic and atraumatic.  Mouth/Throat: Oropharynx is clear and moist. No oropharyngeal exudate.  Eyes: Conjunctivae are normal. Right eye exhibits no discharge. Left eye exhibits no discharge. No scleral icterus.  Neck: Normal range of motion. Neck supple.  Cardiovascular: Normal rate, regular rhythm, normal heart sounds and intact distal pulses.   Pulmonary/Chest: Effort normal and breath sounds normal. No respiratory distress. No wheezes. No rales.  Abdominal: Soft. Bowel sounds are normal. Exhibits no distension and no mass. There is no tenderness.  Musculoskeletal: Normal range of motion. Exhibits no edema.  Lymphadenopathy:    No cervical adenopathy.  Neurological: Alert and oriented to person, place, and time. Exhibits normal muscle tone. Gait normal. Coordination normal.  Skin: Skin is warm and dry. No rash noted. Not diaphoretic. No erythema. No pallor.  Psychiatric: Mood, memory and judgment normal.  Vitals reviewed.  LABORATORY DATA: Lab Results  Component Value Date   WBC 4.4 11/22/2017   HGB 13.6 11/22/2017   HCT 39.1 11/22/2017   MCV 86.6 11/22/2017   PLT 132 (L) 11/22/2017      Chemistry      Component Value Date/Time   NA 133 (L) 11/22/2017 1403   NA 136 06/21/2017 1246   K 4.4 11/22/2017 1403   K 3.6 06/21/2017 1246   CL 103 11/22/2017 1403   CO2 21 (L) 11/22/2017 1403   CO2 25 06/21/2017 1246   BUN 16 11/22/2017 1403   BUN 14.0 06/21/2017 1246   CREATININE 0.81 11/22/2017 1403   CREATININE 0.8 06/21/2017 1246      Component Value  Date/Time   CALCIUM 9.5 11/22/2017 1403   CALCIUM 9.4 06/21/2017 1246   ALKPHOS 125 11/22/2017 1403   ALKPHOS 139 06/21/2017 1246   AST 41 (H) 11/22/2017 1403   AST 18 06/21/2017 1246   ALT 31 11/22/2017 1403   ALT 17 06/21/2017 1246   BILITOT 0.4 11/22/2017 1403   BILITOT 0.41 06/21/2017 1246  RADIOGRAPHIC STUDIES:  No results found.   ASSESSMENT/PLAN:  T1b N2 M0 disease (stage IIIA).adenocarcinoma of the right middle lobe of lung [facility]) This is a very pleasant 77 year old white male with a stage IIIa non-small cell lung cancer, adenocarcinoma with positive PDL 1 expression of 50%. The patient completed a course of concurrent chemoradiation with weekly carboplatin and paclitaxel status post 8 weeks and tolerated his treatment well except for mild dysphagia and odynophagia. He is currently undergoing consolidation immunotherapy with Imfinzi (Durvalumab) status post 22 cycles.  The patient continues to tolerate his treatment fairly well with no concerning complaints.  I recommended for him to proceed with cycle #23 today as a scheduled. I will see him back for follow-up visit in 2 weeks for evaluation before starting cycle #23. He was advised to call immediately if he has any concerning symptoms in the interval. The patient voices understanding of current disease status and treatment options and is in agreement with the current care plan. All questions were answered. The patient knows to call the clinic with any problems, questions or concerns. We can certainly see the patient much sooner if necessary.   No orders of the defined types were placed in this encounter.  [name] Bussing, DNP, AGPCNP-BC, AOCNP 11/22/17

## 2017-11-22 NOTE — Patient Instructions (Signed)
[facility] Discharge Instructions for Patients Receiving Chemotherapy  Today you received the following chemotherapy agent: Imfinzi (durvalumab)  To help prevent nausea and vomiting after your treatment, we encourage you to take your nausea medication as prescribed.   If you develop nausea and vomiting that is not controlled by your nausea medication, call the clinic.   BELOW ARE SYMPTOMS THAT SHOULD BE REPORTED IMMEDIATELY:  *FEVER GREATER THAN 100.5 F  *CHILLS WITH OR WITHOUT FEVER  NAUSEA AND VOMITING THAT IS NOT CONTROLLED WITH YOUR NAUSEA MEDICATION  *UNUSUAL SHORTNESS OF BREATH  *UNUSUAL BRUISING OR BLEEDING  TENDERNESS IN MOUTH AND THROAT WITH OR WITHOUT PRESENCE OF ULCERS  *URINARY PROBLEMS  *BOWEL PROBLEMS  UNUSUAL RASH Items with * indicate a potential emergency and should be followed up as soon as possible.  Feel free to call the clinic should you have any questions or concerns. The clinic phone number is [phone].  Please show the [place] [facility] at check-in to the Emergency Department and triage nurse.

## 2017-11-30 ENCOUNTER — Other Ambulatory Visit: Payer: Self-pay

## 2017-11-30 DIAGNOSIS — I6523 Occlusion and stenosis of bilateral carotid arteries: Secondary | ICD-10-CM

## 2017-12-05 NOTE — Assessment & Plan Note (Addendum)
This is a very pleasant 77 year old white male with a stage IIIa non-small cell lung cancer, adenocarcinoma with positive PDL 1 expression of 50%. The patient completed a course of concurrent chemoradiation with weekly carboplatin and paclitaxel status post 8 weeks and tolerated his treatment well except for mild dysphagia and odynophagia. He is currently undergoing consolidation immunotherapy with Imfinzi (Durvalumab) status post 23cycles.  The patient continues to tolerate his treatment fairly well with no concerning complaints. I recommended for him to proceed with cycle #24 today as a scheduled. I will see him back for follow-up visit in 2 weeks for evaluation before starting cycle #25.  He was advised to call immediately if he has any concerning symptoms in the interval. The patient voices understanding of current disease status and treatment options and is in agreement with the current care plan. All questions were answered. The patient knows to call the clinic with any problems, questions or concerns. We can certainly see the patient much sooner if necessary.

## 2017-12-05 NOTE — Progress Notes (Signed)
[facility] OFFICE PROGRESS NOTE  [name], MD [address] [place] [address]  DIAGNOSIS: Stage IIIA (T1b, N2, M0) non-small cell lung cancer, adenocarcinoma with positive PDL 1 expression 50% diagnosed in March 2018 and presented with right upper lobe lung nodule in addition to mediastinal lymphadenopathy.  PRIOR THERAPY: Course of concurrent chemoradiation with weekly carboplatin for AUC of 2 and paclitaxel 45 MG/M2. First dose of chemotherapy 10/17/2016. Status post 8 cycles. Last dose was given 12/05/2016 with partial response.  CURRENT THERAPY: Consolidation treatment with immunotherapy with Imfinzi (Durvalumab) 10 MG/KG every 2 weeks. First dose 01/18/2017. Status post 23cycles.  INTERVAL HISTORY: [name] [name] 77 y.o. male returns for a routine follow-up visit accompanied by his wife.  The patient is feeling fine today with no specific complaints.  He has been very active in his yard at home.  He has lost some weight secondary to being more active.  He reports that he has a good appetite.  He denies fevers and chills.  Denies chest pain, shortness breath, cough, hemoptysis.  Denies nausea, vomiting, constipation, diarrhea.  He continues to tolerate treatment with immunotherapy fairly well.  The patient is here for evaluation prior to cycle #24 of his treatment.  MEDICAL HISTORY: Past Medical History:  Diagnosis Date  . Adenocarcinoma of right lung, stage 3 ([place]) 09/20/2016  . Atrial fibrillation ([place])    no hx of reported at preop visit of 04/21/11   . CAD (coronary artery disease)   . Carotid artery occlusion    left carotid endarterectomy  . Chronic kidney disease    AAA repair with reimplant of renals   . CHRONIC OBSTRUCTIVE PULMONARY DISEASE    pt denied at visit of 04/21/11   . CORONARY ARTERY DISEASE   . Dehydration 11/15/2016  . Disorder of left sacroiliac joint 08/24/2016  . DIVERTICULOSIS OF COLON   . Encounter for antineoplastic  chemotherapy 10/17/2016  . GERD   . H/O hiatal hernia   . Headache(784.0)    Hx: of years of years  . HYPERTENSION   . HYPOTHYROIDISM   . JOINT EFFUSION, KNEE   . KNEE, ARTHRITIS, DEGEN./OSTEO    right knee   . Myocardial infarction ([facility])    1994   . NEPHROLITHIASIS, HX OF   . OSTEOPOROSIS   . Other dysphagia   . PERIPHERAL VASCULAR DISEASE    AAA - 1994 with reimplant of renals   . Peripheral vascular disease ([facility])    subclavian stenosis PTA - 3/08   . Pure hypercholesterolemia   . Renal artery stenosis ([facility])     ALLERGIES:  is allergic to neurontin [gabapentin]; imitrex [sumatriptan]; and lipitor [atorvastatin].  MEDICATIONS:  Current Outpatient Medications  Medication Sig Dispense Refill  . acetaminophen (TYLENOL) 325 MG tablet Take 325-650 mg by mouth every 6 (six) hours as needed for headache (for pain).     [name] ALPRAZolam (XANAX) 0.25 MG tablet Take 1 tablet (0.25 mg total) by mouth 3 (three) times daily as needed for anxiety. 30 tablet 0  . bumetanide (BUMEX) 1 MG tablet Take 1 tablet (1 mg total) by mouth daily as needed (swelling). 30 tablet 1  . donepezil (ARICEPT) 5 MG tablet Take 1 tablet (5 mg total) by mouth at bedtime. 90 tablet 1  . lansoprazole (PREVACID) 15 MG capsule Take 1 capsule (15 mg total) by mouth daily. 30 capsule 5  . levothyroxine (SYNTHROID, LEVOTHROID) 112 MCG tablet Take 1 tablet (112 mcg total)  by mouth daily. 90 tablet 1  . linaclotide (LINZESS) 145 MCG CAPS capsule Take 1 capsule (145 mcg total) by mouth daily before breakfast. 30 capsule 5  . magic mouthwash w/lidocaine SOLN Composition: 80 cc each of Extra St. Maalox Plus, Diphenhydramine, and Nystatin. Plus 240 cc of 2% viscous lidocaine to make 480 cc. Swish and swallow 2 tsp four times per day. 480 mL 2  . metoprolol tartrate (LOPRESSOR) 25 MG tablet TAKE ONE-HALF TABLET BY MOUTH TWO TIMES DAILY 30 tablet 5  . niacin (SLO-NIACIN) 500 MG tablet Take 3 tablets (1,500 mg total) by mouth at  bedtime. 30 tablet 0  . nitroGLYCERIN (NITROSTAT) 0.4 MG SL tablet Place 1 tablet (0.4 mg total) under the tongue every 5 (five) minutes as needed for chest pain. 25 tablet 10  . ondansetron (ZOFRAN) 8 MG tablet Take 1 tablet (8 mg total) by mouth every 8 (eight) hours as needed for nausea or vomiting. 20 tablet 0  . potassium chloride SA (K-DUR,KLOR-CON) 20 MEQ tablet Take 1 tablet (20 mEq total) by mouth daily. 90 tablet 0  . prochlorperazine (COMPAZINE) 10 MG tablet Take 1 tablet (10 mg total) by mouth every 6 (six) hours as needed for nausea or vomiting. 30 tablet 0  . rosuvastatin (CRESTOR) 20 MG tablet Take 1 tablet (20 mg total) by mouth daily. 90 tablet 0  . sucralfate (CARAFATE) 1 GM/10ML suspension Take 10 mLs (1 g total) by mouth 4 (four) times daily -  with meals and at bedtime. 420 mL 1  . tamsulosin (FLOMAX) 0.4 MG CAPS capsule TAKE ONE CAPSULE BY MOUTH TWICE A DAY. 60 capsule 11  . warfarin (COUMADIN) 5 MG tablet TAKE 1 TABLET DAILY. TAKE AS DIRECTED BYPHYSICIAN 90 tablet 1   No current facility-administered medications for this visit.     SURGICAL HISTORY:  Past Surgical History:  Procedure Laterality Date  . ABDOMINAL AORTIC ANEURYSM REPAIR     with reimplantation of renals   . CARDIAC CATHETERIZATION     2008  . CAROTID ENDARTERECTOMY Left Jan. 30, 2015   CEA  . CHOLECYSTECTOMY  2000   Gall Bladder  . COLONOSCOPY W/ BIOPSIES AND POLYPECTOMY     Hx: of  . CORONARY ARTERY BYPASS GRAFT     1995  . CORONARY STENT PLACEMENT     Hx: of  . ENDARTERECTOMY Left 07/19/2013   Procedure: ENDARTERECTOMY CAROTID;  Surgeon: Mal Misty, MD;  Location: [facility];  Service: Vascular;  Laterality: Left;  . GALLBLADDER SURGERY  2004   . JOINT REPLACEMENT     partial knee replacement on left 2002   . KNEE SURGERY    . ORIF PELVIC FRACTURE Left 08/25/2016   Procedure: OPEN REDUCTION INTERNAL FIXATION (ORIF) PELVIC FRACTURE; plate on front, SI screw on the back;  Surgeon: Altamese Baytown,  MD;  Location: [facility];  Service: Orthopedics;  Laterality: Left;  . OTHER SURGICAL HISTORY     left subclavian stenosis surgery PTA 08/2006   . OTHER SURGICAL HISTORY     carotid surgery on right 2004   . TOTAL KNEE ARTHROPLASTY  04/29/2011   Procedure: TOTAL KNEE ARTHROPLASTY;  Surgeon: Gearlean Alf;  Location: [facility];  Service: Orthopedics;  Laterality: Right;  . urinary retention    . VASCULAR SURGERY     AAA  . VASECTOMY  1973  . VIDEO BRONCHOSCOPY WITH ENDOBRONCHIAL ULTRASOUND [name] 09/09/2016   Procedure: VIDEO BRONCHOSCOPY WITH ENDOBRONCHIAL ULTRASOUND;  Surgeon: Grace Isaac, MD;  Location: [facility];  Service: Thoracic;  Laterality: [name];    REVIEW OF SYSTEMS:   Review of Systems  Constitutional: Negative for appetite change, chills, fatigue, fever.  Positive for weight loss.  HENT:   Negative for mouth sores, nosebleeds, sore throat and trouble swallowing.   Eyes: Negative for eye problems and icterus.  Respiratory: Negative for cough, hemoptysis, shortness of breath and wheezing.   Cardiovascular: Negative for chest pain and leg swelling.  Gastrointestinal: Negative for abdominal pain, constipation, diarrhea, nausea and vomiting.  Genitourinary: Negative for bladder incontinence, difficulty urinating, dysuria, frequency and hematuria.   Musculoskeletal: Negative for back pain, gait problem, neck pain and neck stiffness.  Skin: Negative for itching and rash.  Neurological: Negative for dizziness, extremity weakness, gait problem, headaches, light-headedness and seizures.  Hematological: Negative for adenopathy. Does not bruise/bleed easily.  Psychiatric/Behavioral: Negative for confusion, depression and sleep disturbance. The patient is not nervous/anxious.     PHYSICAL EXAMINATION:  Blood pressure (!) 112/54, pulse 66, temperature 97.7 F (36.5 C), temperature source Oral, resp. rate 18, height 5\' 7"  (1.702 m), weight 128 lb 12.8 oz (58.4 kg), SpO2 98 %.  ECOG PERFORMANCE  STATUS: 1 - Symptomatic but completely ambulatory  Physical Exam  Constitutional: Oriented to person, place, and time and well-developed, well-nourished, and in no distress. No distress.  HENT:  Head: Normocephalic and atraumatic.  Mouth/Throat: Oropharynx is clear and moist. No oropharyngeal exudate.  Eyes: Conjunctivae are normal. Right eye exhibits no discharge. Left eye exhibits no discharge. No scleral icterus.  Neck: Normal range of motion. Neck supple.  Cardiovascular: Normal rate, regular rhythm, normal heart sounds and intact distal pulses.   Pulmonary/Chest: Effort normal and breath sounds normal. No respiratory distress. No wheezes. No rales.  Abdominal: Soft. Bowel sounds are normal. Exhibits no distension and no mass. There is no tenderness.  Musculoskeletal: Normal range of motion. Exhibits no edema.  Lymphadenopathy:    No cervical adenopathy.  Neurological: Alert and oriented to person, place, and time. Exhibits normal muscle tone. Gait normal. Coordination normal.  Skin: Skin is warm and dry. No rash noted. Not diaphoretic. No erythema. No pallor.  Psychiatric: Mood, memory and judgment normal.  Vitals reviewed.  LABORATORY DATA: Lab Results  Component Value Date   WBC 5.9 12/06/2017   HGB 13.3 12/06/2017   HCT 38.0 (L) 12/06/2017   MCV 86.8 12/06/2017   PLT 162 12/06/2017      Chemistry      Component Value Date/Time   NA 135 (L) 12/06/2017 0909   NA 136 06/21/2017 1246   K 4.2 12/06/2017 0909   K 3.6 06/21/2017 1246   CL 104 12/06/2017 0909   CO2 24 12/06/2017 0909   CO2 25 06/21/2017 1246   BUN 19 12/06/2017 0909   BUN 14.0 06/21/2017 1246   CREATININE 1.01 12/06/2017 0909   CREATININE 0.8 06/21/2017 1246      Component Value Date/Time   CALCIUM 9.7 12/06/2017 0909   CALCIUM 9.4 06/21/2017 1246   ALKPHOS 123 12/06/2017 0909   ALKPHOS 139 06/21/2017 1246   AST 29 12/06/2017 0909   AST 18 06/21/2017 1246   ALT 24 12/06/2017 0909   ALT 17  06/21/2017 1246   BILITOT 0.4 12/06/2017 0909   BILITOT 0.41 06/21/2017 1246       RADIOGRAPHIC STUDIES:  No results found.   ASSESSMENT/PLAN:  T1b N2 M0 disease (stage IIIA).adenocarcinoma of the right middle lobe of lung [facility]) This is a very pleasant 77 year  old white male with a stage IIIa non-small cell lung cancer, adenocarcinoma with positive PDL 1 expression of 50%. The patient completed a course of concurrent chemoradiation with weekly carboplatin and paclitaxel status post 8 weeks and tolerated his treatment well except for mild dysphagia and odynophagia. He is currently undergoing consolidation immunotherapy with Imfinzi (Durvalumab) status post 23cycles.  The patient continues to tolerate his treatment fairly well with no concerning complaints. I recommended for him to proceed with cycle #24 today as a scheduled. I will see him back for follow-up visit in 2 weeks for evaluation before starting cycle #25.  He was advised to call immediately if he has any concerning symptoms in the interval. The patient voices understanding of current disease status and treatment options and is in agreement with the current care plan. All questions were answered. The patient knows to call the clinic with any problems, questions or concerns. We can certainly see the patient much sooner if necessary.   No orders of the defined types were placed in this encounter.  Mikey Bussing, DNP, AGPCNP-BC, AOCNP 12/06/17

## 2017-12-06 ENCOUNTER — Telehealth: Payer: Self-pay | Admitting: Internal Medicine

## 2017-12-06 ENCOUNTER — Inpatient Hospital Stay: Payer: Medicare Other

## 2017-12-06 ENCOUNTER — Other Ambulatory Visit: Payer: Self-pay

## 2017-12-06 ENCOUNTER — Inpatient Hospital Stay (HOSPITAL_BASED_OUTPATIENT_CLINIC_OR_DEPARTMENT_OTHER): Payer: Medicare Other | Admitting: Oncology

## 2017-12-06 ENCOUNTER — Encounter: Payer: Self-pay | Admitting: Oncology

## 2017-12-06 VITALS — BP 112/54 | HR 66 | Temp 97.7°F | Resp 18 | Ht 67.0 in | Wt 128.8 lb

## 2017-12-06 DIAGNOSIS — R5383 Other fatigue: Secondary | ICD-10-CM

## 2017-12-06 DIAGNOSIS — C342 Malignant neoplasm of middle lobe, bronchus or lung: Secondary | ICD-10-CM

## 2017-12-06 DIAGNOSIS — C3411 Malignant neoplasm of upper lobe, right bronchus or lung: Secondary | ICD-10-CM | POA: Diagnosis not present

## 2017-12-06 DIAGNOSIS — Z5112 Encounter for antineoplastic immunotherapy: Secondary | ICD-10-CM

## 2017-12-06 DIAGNOSIS — Z79899 Other long term (current) drug therapy: Secondary | ICD-10-CM | POA: Diagnosis not present

## 2017-12-06 LAB — CMP (CANCER CENTER ONLY)
ALBUMIN: 3.9 g/dL (ref 3.5–5.0)
ALT: 24 U/L (ref 0–55)
ANION GAP: 7 (ref 3–11)
AST: 29 U/L (ref 5–34)
Alkaline Phosphatase: 123 U/L (ref 40–150)
BILIRUBIN TOTAL: 0.4 mg/dL (ref 0.2–1.2)
BUN: 19 mg/dL (ref 7–26)
CHLORIDE: 104 mmol/L (ref 98–109)
CO2: 24 mmol/L (ref 22–29)
Calcium: 9.7 mg/dL (ref 8.4–10.4)
Creatinine: 1.01 mg/dL (ref 0.70–1.30)
GFR, Est AFR Am: >60 mL/min (ref >60)
Glucose, Bld: 111 mg/dL (ref 70–140)
POTASSIUM: 4.2 mmol/L (ref 3.5–5.1)
Sodium: 135 mmol/L — ABNORMAL LOW (ref 136–145)
TOTAL PROTEIN: 7.1 g/dL (ref 6.4–8.3)

## 2017-12-06 LAB — CBC WITH DIFFERENTIAL (CANCER CENTER ONLY)
BASOS ABS: 0 10*3/uL (ref 0.0–0.1)
Basophils Relative: 1 %
EOS PCT: 2 %
Eosinophils Absolute: 0.1 10*3/uL (ref 0.0–0.5)
HCT: 38 % — ABNORMAL LOW (ref 38.4–49.9)
Hemoglobin: 13.3 g/dL (ref 13.0–17.1)
LYMPHS ABS: 1 10*3/uL (ref 0.9–3.3)
LYMPHS PCT: 18 %
MCH: 30.4 pg (ref 27.2–33.4)
MCHC: 35.1 g/dL (ref 32.0–36.0)
MCV: 86.8 fL (ref 79.3–98.0)
Monocytes Absolute: 0.3 10*3/uL (ref 0.1–0.9)
Monocytes Relative: 6 %
NEUTROS ABS: 4.4 10*3/uL (ref 1.5–6.5)
Neutrophils Relative %: 73 %
PLATELETS: 162 10*3/uL (ref 140–400)
RBC: 4.38 MIL/uL (ref 4.20–5.82)
RDW: 14.9 % — ABNORMAL HIGH (ref 11.0–14.6)
WBC: 5.9 10*3/uL (ref 4.0–10.3)

## 2017-12-06 LAB — TSH: TSH: 0.734 u[IU]/mL (ref 0.320–4.118)

## 2017-12-06 MED ORDER — SODIUM CHLORIDE 0.9 % IV SOLN
Freq: Once | INTRAVENOUS | Status: AC
  Administered 2017-12-06: 11:00:00 via INTRAVENOUS

## 2017-12-06 MED ORDER — SODIUM CHLORIDE 0.9 % IV SOLN
10.0000 mg/kg | Freq: Once | INTRAVENOUS | Status: AC
  Administered 2017-12-06: 620 mg via INTRAVENOUS
  Filled 2017-12-06: qty 10

## 2017-12-06 NOTE — Patient Instructions (Signed)
[facility] Discharge Instructions for Patients Receiving Chemotherapy  Today you received the following chemotherapy agent: Imfinzi (durvalumab)  To help prevent nausea and vomiting after your treatment, we encourage you to take your nausea medication as prescribed.   If you develop nausea and vomiting that is not controlled by your nausea medication, call the clinic.   BELOW ARE SYMPTOMS THAT SHOULD BE REPORTED IMMEDIATELY:  *FEVER GREATER THAN 100.5 F  *CHILLS WITH OR WITHOUT FEVER  NAUSEA AND VOMITING THAT IS NOT CONTROLLED WITH YOUR NAUSEA MEDICATION  *UNUSUAL SHORTNESS OF BREATH  *UNUSUAL BRUISING OR BLEEDING  TENDERNESS IN MOUTH AND THROAT WITH OR WITHOUT PRESENCE OF ULCERS  *URINARY PROBLEMS  *BOWEL PROBLEMS  UNUSUAL RASH Items with * indicate a potential emergency and should be followed up as soon as possible.  Feel free to call the clinic should you have any questions or concerns. The clinic phone number is [phone].  Please show the [facility] at check-in to the Emergency Department and triage nurse.

## 2017-12-06 NOTE — Telephone Encounter (Signed)
Appts already scheduled per 6/19 los.

## 2017-12-07 ENCOUNTER — Ambulatory Visit (INDEPENDENT_AMBULATORY_CARE_PROVIDER_SITE_OTHER): Payer: Medicare Other

## 2017-12-07 DIAGNOSIS — Z7901 Long term (current) use of anticoagulants: Secondary | ICD-10-CM

## 2017-12-07 LAB — POCT INR: INR: 2.2 (ref 2.0–3.0)

## 2017-12-07 NOTE — Patient Instructions (Signed)
Continue the same Coumadin 5 mg 1 1/2 on Sunday,Tuesday,Friday, One all other days.

## 2017-12-20 ENCOUNTER — Telehealth: Payer: Self-pay | Admitting: Internal Medicine

## 2017-12-20 ENCOUNTER — Inpatient Hospital Stay: Payer: Medicare Other | Attending: Nurse Practitioner

## 2017-12-20 ENCOUNTER — Inpatient Hospital Stay: Payer: Medicare Other

## 2017-12-20 ENCOUNTER — Encounter: Payer: Self-pay | Admitting: Internal Medicine

## 2017-12-20 ENCOUNTER — Inpatient Hospital Stay (HOSPITAL_BASED_OUTPATIENT_CLINIC_OR_DEPARTMENT_OTHER): Payer: Medicare Other | Admitting: Internal Medicine

## 2017-12-20 VITALS — BP 134/64 | HR 61 | Temp 97.5°F | Resp 18 | Ht 67.0 in | Wt 131.3 lb

## 2017-12-20 DIAGNOSIS — Z5112 Encounter for antineoplastic immunotherapy: Secondary | ICD-10-CM | POA: Diagnosis not present

## 2017-12-20 DIAGNOSIS — C342 Malignant neoplasm of middle lobe, bronchus or lung: Secondary | ICD-10-CM

## 2017-12-20 DIAGNOSIS — Z79899 Other long term (current) drug therapy: Secondary | ICD-10-CM | POA: Diagnosis not present

## 2017-12-20 DIAGNOSIS — C3411 Malignant neoplasm of upper lobe, right bronchus or lung: Secondary | ICD-10-CM | POA: Insufficient documentation

## 2017-12-20 DIAGNOSIS — I10 Essential (primary) hypertension: Secondary | ICD-10-CM

## 2017-12-20 DIAGNOSIS — E039 Hypothyroidism, unspecified: Secondary | ICD-10-CM

## 2017-12-20 LAB — CMP (CANCER CENTER ONLY)
ALT: 43 U/L (ref 0–44)
ANION GAP: 6 (ref 5–15)
AST: 46 U/L — ABNORMAL HIGH (ref 15–41)
Albumin: 3.9 g/dL (ref 3.5–5.0)
Alkaline Phosphatase: 134 U/L — ABNORMAL HIGH (ref 38–126)
BILIRUBIN TOTAL: 0.5 mg/dL (ref 0.3–1.2)
BUN: 17 mg/dL (ref 8–23)
CALCIUM: 9.8 mg/dL (ref 8.9–10.3)
CO2: 25 mmol/L (ref 22–32)
Chloride: 104 mmol/L (ref 98–111)
Creatinine: 0.85 mg/dL (ref 0.61–1.24)
GLUCOSE: 98 mg/dL (ref 70–99)
Potassium: 4.1 mmol/L (ref 3.5–5.1)
Sodium: 135 mmol/L (ref 135–145)
TOTAL PROTEIN: 6.9 g/dL (ref 6.5–8.1)

## 2017-12-20 LAB — CBC WITH DIFFERENTIAL (CANCER CENTER ONLY)
BASOS ABS: 0 10*3/uL (ref 0.0–0.1)
BASOS PCT: 1 %
Eosinophils Absolute: 0.1 10*3/uL (ref 0.0–0.5)
Eosinophils Relative: 3 %
HEMATOCRIT: 38.1 % — AB (ref 38.4–49.9)
Hemoglobin: 13.2 g/dL (ref 13.0–17.1)
Lymphocytes Relative: 17 %
Lymphs Abs: 0.8 10*3/uL — ABNORMAL LOW (ref 0.9–3.3)
MCH: 30.2 pg (ref 27.2–33.4)
MCHC: 34.7 g/dL (ref 32.0–36.0)
MCV: 87 fL (ref 79.3–98.0)
MONO ABS: 0.3 10*3/uL (ref 0.1–0.9)
Monocytes Relative: 6 %
NEUTROS ABS: 3.5 10*3/uL (ref 1.5–6.5)
NEUTROS PCT: 73 %
Platelet Count: 141 10*3/uL (ref 140–400)
RBC: 4.38 MIL/uL (ref 4.20–5.82)
RDW: 15 % — AB (ref 11.0–14.6)
WBC: 4.8 10*3/uL (ref 4.0–10.3)

## 2017-12-20 LAB — TSH: TSH: 0.667 u[IU]/mL (ref 0.320–4.118)

## 2017-12-20 MED ORDER — SODIUM CHLORIDE 0.9 % IV SOLN
Freq: Once | INTRAVENOUS | Status: AC
  Administered 2017-12-20: 12:00:00 via INTRAVENOUS

## 2017-12-20 MED ORDER — SODIUM CHLORIDE 0.9 % IV SOLN
10.0000 mg/kg | Freq: Once | INTRAVENOUS | Status: AC
  Administered 2017-12-20: 620 mg via INTRAVENOUS
  Filled 2017-12-20: qty 2.4

## 2017-12-20 NOTE — Progress Notes (Signed)
[facility] [phone]   [phone]  OFFICE PROGRESS NOTE  [name] Kirschner, MD [address] [place] [address]  DIAGNOSIS: Stage IIIA (T1b, N2, M0) non-small cell lung cancer, adenocarcinoma with positive PDL 1 expression 50% diagnosed in March 2018 and presented with right upper lobe lung nodule in addition to mediastinal lymphadenopathy.  PRIOR THERAPY: Course of concurrent chemoradiation with weekly carboplatin for AUC of 2 and paclitaxel 45 MG/M2. First dose of chemotherapy 10/17/2016. Status post 8 cycles. Last dose was given 12/05/2016 with partial response.[name]  CURRENT THERAPY: Consolidation treatment with immunotherapy with Imfinzi (Durvalumab) 10 MG/KG every 2 weeks. First dose 01/18/2017. Status post 24 cycles.  INTERVAL HISTORY: [name] [name] 77 y.o. male returns to the clinic today for follow-up visit accompanied by his wife.  The patient is feeling fine today with no specific complaints except for questionable right inguinal hernia.  There is no pain or symptoms in this area except feeling it.  He denied having any current chest pain, shortness of breath, cough or hemoptysis.  He denied having any recent fever or chills.  He has no nausea, vomiting, diarrhea or constipation.  He has no weight loss or night sweats.  He is here today for evaluation before starting cycle #25.   MEDICAL HISTORY: Past Medical History:  Diagnosis Date  . Adenocarcinoma of right lung, stage 3 ([place]) 09/20/2016  . Atrial fibrillation ([place])    no hx of reported at preop visit of 04/21/11   . CAD (coronary artery disease)   . Carotid artery occlusion    left carotid endarterectomy  . Chronic kidney disease    AAA repair with reimplant of renals   . CHRONIC OBSTRUCTIVE PULMONARY DISEASE    pt denied at visit of 04/21/11   . CORONARY ARTERY DISEASE   . Dehydration 11/15/2016  . Disorder of left sacroiliac joint 08/24/2016  . DIVERTICULOSIS OF COLON    . Encounter for antineoplastic chemotherapy 10/17/2016  . GERD   . H/O hiatal hernia   . Headache(784.0)    Hx: of years of years  . HYPERTENSION   . HYPOTHYROIDISM   . JOINT EFFUSION, KNEE   . KNEE, ARTHRITIS, DEGEN./OSTEO    right knee   . Myocardial infarction ([place])    1994   . NEPHROLITHIASIS, HX OF   . OSTEOPOROSIS   . Other dysphagia   . PERIPHERAL VASCULAR DISEASE    AAA - 1994 with reimplant of renals   . Peripheral vascular disease ([place] [place])    subclavian stenosis PTA - 3/08   . Pure hypercholesterolemia   . Renal artery stenosis ([facility])     ALLERGIES:  is allergic to neurontin [gabapentin]; imitrex [sumatriptan]; and lipitor [atorvastatin].  MEDICATIONS:  Current Outpatient Medications  Medication Sig Dispense Refill  . acetaminophen (TYLENOL) 325 MG tablet Take 325-650 mg by mouth every 6 (six) hours as needed for headache (for pain).     [name] ALPRAZolam (XANAX) 0.25 MG tablet Take 1 tablet (0.25 mg total) by mouth 3 (three) times daily as needed for anxiety. 30 tablet 0  . bumetanide (BUMEX) 1 MG tablet Take 1 tablet (1 mg total) by mouth daily as needed (swelling). 30 tablet 1  . donepezil (ARICEPT) 5 MG tablet Take 1 tablet (5 mg total) by mouth at bedtime. 90 tablet 1  . lansoprazole (PREVACID) 15 MG capsule Take 1 capsule (15 mg total) by mouth daily. 30 capsule 5  . levothyroxine (  SYNTHROID, LEVOTHROID) 112 MCG tablet Take 1 tablet (112 mcg total) by mouth daily. 90 tablet 1  . linaclotide (LINZESS) 145 MCG CAPS capsule Take 1 capsule (145 mcg total) by mouth daily before breakfast. 30 capsule 5  . magic mouthwash w/lidocaine SOLN Composition: 80 cc each of Extra St. Maalox Plus, Diphenhydramine, and Nystatin. Plus 240 cc of 2% viscous lidocaine to make 480 cc. Swish and swallow 2 tsp four times per day. 480 mL 2  . metoprolol tartrate (LOPRESSOR) 25 MG tablet TAKE ONE-HALF TABLET BY MOUTH TWO TIMES DAILY 30 tablet 5  . niacin (SLO-NIACIN) 500 MG tablet Take 3 tablets  (1,500 mg total) by mouth at bedtime. 30 tablet 0  . nitroGLYCERIN (NITROSTAT) 0.4 MG SL tablet Place 1 tablet (0.4 mg total) under the tongue every 5 (five) minutes as needed for chest pain. 25 tablet 10  . ondansetron (ZOFRAN) 8 MG tablet Take 1 tablet (8 mg total) by mouth every 8 (eight) hours as needed for nausea or vomiting. 20 tablet 0  . potassium chloride SA (K-DUR,KLOR-CON) 20 MEQ tablet Take 1 tablet (20 mEq total) by mouth daily. 90 tablet 0  . prochlorperazine (COMPAZINE) 10 MG tablet Take 1 tablet (10 mg total) by mouth every 6 (six) hours as needed for nausea or vomiting. 30 tablet 0  . rosuvastatin (CRESTOR) 20 MG tablet Take 1 tablet (20 mg total) by mouth daily. 90 tablet 0  . sucralfate (CARAFATE) 1 GM/10ML suspension Take 10 mLs (1 g total) by mouth 4 (four) times daily -  with meals and at bedtime. 420 mL 1  . tamsulosin (FLOMAX) 0.4 MG CAPS capsule TAKE ONE CAPSULE BY MOUTH TWICE A DAY. 60 capsule 11  . warfarin (COUMADIN) 5 MG tablet TAKE 1 TABLET DAILY. TAKE AS DIRECTED BYPHYSICIAN 90 tablet 1   No current facility-administered medications for this visit.     SURGICAL HISTORY:  Past Surgical History:  Procedure Laterality Date  . ABDOMINAL AORTIC ANEURYSM REPAIR     with reimplantation of renals   . CARDIAC CATHETERIZATION     2008  . CAROTID ENDARTERECTOMY Left Jan. 30, 2015   CEA  . CHOLECYSTECTOMY  2000   Gall Bladder  . COLONOSCOPY W/ BIOPSIES AND POLYPECTOMY     Hx: of  . CORONARY ARTERY BYPASS GRAFT     1995  . CORONARY STENT PLACEMENT     Hx: of  . ENDARTERECTOMY Left 07/19/2013   Procedure: ENDARTERECTOMY CAROTID;  Surgeon: [name] Misty, MD;  Location: [facility];  Service: Vascular;  Laterality: Left;  . GALLBLADDER SURGERY  2004   . JOINT REPLACEMENT     partial knee replacement on left 2002   . KNEE SURGERY    . ORIF PELVIC FRACTURE Left 08/25/2016   Procedure: OPEN REDUCTION INTERNAL FIXATION (ORIF) PELVIC FRACTURE; plate on front, SI screw on the  back;  Surgeon: [name] Lihue, MD;  Location: [facility];  Service: Orthopedics;  Laterality: Left;  . OTHER SURGICAL HISTORY     left subclavian stenosis surgery PTA 08/2006   . OTHER SURGICAL HISTORY     carotid surgery on right 2004   . TOTAL KNEE ARTHROPLASTY  04/29/2011   Procedure: TOTAL KNEE ARTHROPLASTY;  Surgeon: Gearlean Alf;  [facility];  Service: Orthopedics;  Laterality: Right;  . urinary retention    . VASCULAR SURGERY     AAA  . VASECTOMY  1973  . VIDEO BRONCHOSCOPY WITH ENDOBRONCHIAL ULTRASOUND [name] 09/09/2016   Procedure: VIDEO  BRONCHOSCOPY WITH ENDOBRONCHIAL ULTRASOUND;  Surgeon: Grace Isaac, MD;  Location: [facility];  Service: Thoracic;  Laterality: [name];    REVIEW OF SYSTEMS:  A comprehensive review of systems was negative.   PHYSICAL EXAMINATION: General appearance: alert, cooperative and no distress Head: Normocephalic, without obvious abnormality, atraumatic Neck: no adenopathy, no JVD, supple, symmetrical, trachea midline and thyroid not enlarged, symmetric, no tenderness/mass/nodules Lymph nodes: Cervical, supraclavicular, and axillary nodes normal. Resp: clear to auscultation bilaterally Back: symmetric, no curvature. ROM normal. No CVA tenderness. Cardio: regular rate and rhythm, S1, S2 normal, no murmur, click, rub or gallop GI: soft, non-tender; bowel sounds normal; no masses,  no organomegaly Extremities: extremities normal, atraumatic, no cyanosis or edema  ECOG PERFORMANCE STATUS: 1 - Symptomatic but completely ambulatory  Blood pressure 134/64, pulse 61, temperature (!) 97.5 F (36.4 C), temperature source Oral, resp. rate 18, height 5\' 7"  (1.702 m), weight 131 lb 4.8 oz (59.6 kg), SpO2 100 %.  LABORATORY DATA: Lab Results  Component Value Date   WBC 4.8 12/20/2017   HGB 13.2 12/20/2017   HCT 38.1 (L) 12/20/2017   MCV 87.0 12/20/2017   PLT 141 12/20/2017      Chemistry      Component Value Date/Time   NA 135 12/20/2017 0919   NA 136  06/21/2017 1246   K 4.1 12/20/2017 0919   K 3.6 06/21/2017 1246   CL 104 12/20/2017 0919   CO2 25 12/20/2017 0919   CO2 25 06/21/2017 1246   BUN 17 12/20/2017 0919   BUN 14.0 06/21/2017 1246   CREATININE 0.85 12/20/2017 0919   CREATININE 0.8 06/21/2017 1246      Component Value Date/Time   CALCIUM 9.8 12/20/2017 0919   CALCIUM 9.4 06/21/2017 1246   ALKPHOS 134 (H) 12/20/2017 0919   ALKPHOS 139 06/21/2017 1246   AST 46 (H) 12/20/2017 0919   AST 18 06/21/2017 1246   ALT 43 12/20/2017 0919   ALT 17 06/21/2017 1246   BILITOT 0.5 12/20/2017 0919   BILITOT 0.41 06/21/2017 1246       RADIOGRAPHIC STUDIES: No results found.  ASSESSMENT AND PLAN:  This is a very pleasant 77 years old white male with a stage IIIa non-small cell lung cancer, adenocarcinoma with positive PDL 1 expression of 50%. The patient completed a course of concurrent chemoradiation with weekly carboplatin and paclitaxel status post 8 weeks and tolerated his treatment well except for mild dysphagia and odynophagia. The patient is currently undergoing consolidation immunotherapy with Imfinzi (Durvalumab) status post 24 cycles.  He has been tolerating this treatment well with no concerning complaints. I recommended for the patient to proceed with cycle #25 today as a scheduled. I will see him back for follow-up visit in 2 weeks for evaluation before starting the last cycle of his consolidation immunotherapy. He was advised to call immediately if he has any concerning symptoms in the interval. The patient voices understanding of current disease status and treatment options and is in agreement with the current care plan. All questions were answered. The patient knows to call the clinic with any problems, questions or concerns. We can certainly see the patient much sooner if necessary.  Disclaimer: This note was dictated with voice recognition software. Similar sounding words can inadvertently be transcribed and may not be  corrected upon review.

## 2017-12-20 NOTE — Telephone Encounter (Signed)
Appt already scheduled per 7/3 los - next treatment end of treatment plan -

## 2017-12-20 NOTE — Patient Instructions (Signed)
[facility] Discharge Instructions for Patients Receiving Chemotherapy  Today you received the following chemotherapy agents: Imfinzi.  To help prevent nausea and vomiting after your treatment, we encourage you to take your nausea medication as directed.   If you develop nausea and vomiting that is not controlled by your nausea medication, call the clinic.   BELOW ARE SYMPTOMS THAT SHOULD BE REPORTED IMMEDIATELY:  *FEVER GREATER THAN 100.5 F  *CHILLS WITH OR WITHOUT FEVER  NAUSEA AND VOMITING THAT IS NOT CONTROLLED WITH YOUR NAUSEA MEDICATION  *UNUSUAL SHORTNESS OF BREATH  *UNUSUAL BRUISING OR BLEEDING  TENDERNESS IN MOUTH AND THROAT WITH OR WITHOUT PRESENCE OF ULCERS  *URINARY PROBLEMS  *BOWEL PROBLEMS  UNUSUAL RASH Items with * indicate a potential emergency and should be followed up as soon as possible.  Feel free to call the clinic should you have any questions or concerns. The clinic phone number is [phone].  Please show the CHEMO ALERT CARD at check-in to the Emergency Department and triage nurse.   

## 2018-01-03 ENCOUNTER — Inpatient Hospital Stay: Payer: Medicare Other

## 2018-01-03 ENCOUNTER — Telehealth: Payer: Self-pay | Admitting: Internal Medicine

## 2018-01-03 ENCOUNTER — Inpatient Hospital Stay (HOSPITAL_BASED_OUTPATIENT_CLINIC_OR_DEPARTMENT_OTHER): Payer: Medicare Other | Admitting: Internal Medicine

## 2018-01-03 ENCOUNTER — Encounter: Payer: Self-pay | Admitting: Internal Medicine

## 2018-01-03 VITALS — BP 143/70 | HR 62 | Temp 98.0°F | Resp 17 | Ht 67.0 in | Wt 131.8 lb

## 2018-01-03 DIAGNOSIS — C342 Malignant neoplasm of middle lobe, bronchus or lung: Secondary | ICD-10-CM

## 2018-01-03 DIAGNOSIS — C3411 Malignant neoplasm of upper lobe, right bronchus or lung: Secondary | ICD-10-CM | POA: Diagnosis not present

## 2018-01-03 DIAGNOSIS — I10 Essential (primary) hypertension: Secondary | ICD-10-CM

## 2018-01-03 DIAGNOSIS — Z5112 Encounter for antineoplastic immunotherapy: Secondary | ICD-10-CM | POA: Diagnosis not present

## 2018-01-03 DIAGNOSIS — Z79899 Other long term (current) drug therapy: Secondary | ICD-10-CM | POA: Diagnosis not present

## 2018-01-03 DIAGNOSIS — C3490 Malignant neoplasm of unspecified part of unspecified bronchus or lung: Secondary | ICD-10-CM

## 2018-01-03 LAB — CMP (CANCER CENTER ONLY)
ALK PHOS: 127 U/L — AB (ref 38–126)
ALT: 34 U/L (ref 0–44)
AST: 35 U/L (ref 15–41)
Albumin: 3.8 g/dL (ref 3.5–5.0)
Anion gap: 6 (ref 5–15)
BUN: 19 mg/dL (ref 8–23)
CALCIUM: 9.5 mg/dL (ref 8.9–10.3)
CO2: 26 mmol/L (ref 22–32)
Chloride: 103 mmol/L (ref 98–111)
Creatinine: 0.84 mg/dL (ref 0.61–1.24)
GFR, Est AFR Am: >60 mL/min (ref >60)
GFR, Estimated: >60 mL/min (ref >60)
GLUCOSE: 103 mg/dL — AB (ref 70–99)
Potassium: 4.1 mmol/L (ref 3.5–5.1)
SODIUM: 135 mmol/L (ref 135–145)
Total Bilirubin: 0.5 mg/dL (ref 0.3–1.2)
Total Protein: 6.9 g/dL (ref 6.5–8.1)

## 2018-01-03 LAB — CBC WITH DIFFERENTIAL (CANCER CENTER ONLY)
BASOS PCT: 1 %
Basophils Absolute: 0 10*3/uL (ref 0.0–0.1)
EOS ABS: 0.1 10*3/uL (ref 0.0–0.5)
Eosinophils Relative: 2 %
HEMATOCRIT: 39.7 % (ref 38.4–49.9)
Hemoglobin: 13.7 g/dL (ref 13.0–17.1)
Lymphocytes Relative: 22 %
Lymphs Abs: 1.1 10*3/uL (ref 0.9–3.3)
MCH: 30 pg (ref 27.2–33.4)
MCHC: 34.6 g/dL (ref 32.0–36.0)
MCV: 86.8 fL (ref 79.3–98.0)
MONO ABS: 0.3 10*3/uL (ref 0.1–0.9)
MONOS PCT: 7 %
Neutro Abs: 3.5 10*3/uL (ref 1.5–6.5)
Neutrophils Relative %: 68 %
Platelet Count: 136 10*3/uL — ABNORMAL LOW (ref 140–400)
RBC: 4.57 MIL/uL (ref 4.20–5.82)
RDW: 15 % — AB (ref 11.0–14.6)
WBC Count: 5 10*3/uL (ref 4.0–10.3)

## 2018-01-03 MED ORDER — SODIUM CHLORIDE 0.9 % IV SOLN
10.0000 mg/kg | Freq: Once | INTRAVENOUS | Status: AC
  Administered 2018-01-03: 620 mg via INTRAVENOUS
  Filled 2018-01-03: qty 10

## 2018-01-03 MED ORDER — SODIUM CHLORIDE 0.9 % IV SOLN
Freq: Once | INTRAVENOUS | Status: AC
  Administered 2018-01-03: 12:00:00 via INTRAVENOUS

## 2018-01-03 NOTE — Patient Instructions (Signed)
[facility] Discharge Instructions for Patients Receiving Chemotherapy  Today you received the following chemotherapy agents:  Imfinzi (durvalumab)  To help prevent nausea and vomiting after your treatment, we encourage you to take your nausea medication as prescribed.   If you develop nausea and vomiting that is not controlled by your nausea medication, call the clinic.   BELOW ARE SYMPTOMS THAT SHOULD BE REPORTED IMMEDIATELY:  *FEVER GREATER THAN 100.5 F  *CHILLS WITH OR WITHOUT FEVER  NAUSEA AND VOMITING THAT IS NOT CONTROLLED WITH YOUR NAUSEA MEDICATION  *UNUSUAL SHORTNESS OF BREATH  *UNUSUAL BRUISING OR BLEEDING  TENDERNESS IN MOUTH AND THROAT WITH OR WITHOUT PRESENCE OF ULCERS  *URINARY PROBLEMS  *BOWEL PROBLEMS  UNUSUAL RASH Items with * indicate a potential emergency and should be followed up as soon as possible.  Feel free to call the clinic should you have any questions or concerns. The clinic phone number is [phone].  Please show the [facility] at check-in to the Emergency Department and triage nurse.

## 2018-01-03 NOTE — Progress Notes (Signed)
[facility] [phone]   [phone]  OFFICE PROGRESS NOTE  [name], MD [address] [place] [address]  DIAGNOSIS: Stage IIIA (T1b, N2, M0) non-small cell lung cancer, adenocarcinoma with positive PDL 1 expression 50% diagnosed in March 2018 and presented with right upper lobe lung nodule in addition to mediastinal lymphadenopathy.  PRIOR THERAPY: Course of concurrent chemoradiation with weekly carboplatin for AUC of 2 and paclitaxel 45 MG/M2. First dose of chemotherapy 10/17/2016. Status post 8 cycles. Last dose was given 12/05/2016 with partial response.[name]  CURRENT THERAPY: Consolidation treatment with immunotherapy with Imfinzi (Durvalumab) 10 MG/KG every 2 weeks. First dose 01/18/2017. Status post 25 cycles.  INTERVAL HISTORY: [name] [name] 77 y.o. male returns to the clinic today for follow-up visit accompanied by his wife.  The patient is feeling fine today with no specific complaints.  He denied having any recent weight loss or night sweats.  He has no nausea, vomiting, diarrhea or constipation.  He has no fever or chills.  He denied having any significant chest pain, shortness of breath, cough or hemoptysis.  He has no headache or visual changes.  He is here today for evaluation before the last cycle of his consolidation treatment with immunotherapy with Imfinzi (Durvalumab).   MEDICAL HISTORY: Past Medical History:  Diagnosis Date  . Adenocarcinoma of right lung, stage 3 ([facility]) 09/20/2016  . Atrial fibrillation ([facility])    no hx of reported at preop visit of 04/21/11   . CAD (coronary artery disease)   . Carotid artery occlusion    left carotid endarterectomy  . Chronic kidney disease    AAA repair with reimplant of renals   . CHRONIC OBSTRUCTIVE PULMONARY DISEASE    pt denied at visit of 04/21/11   . CORONARY ARTERY DISEASE   . Dehydration 11/15/2016  . Disorder of left sacroiliac joint 08/24/2016  . DIVERTICULOSIS  OF COLON   . Encounter for antineoplastic chemotherapy 10/17/2016  . GERD   . H/O hiatal hernia   . Headache(784.0)    Hx: of years of years  . HYPERTENSION   . HYPOTHYROIDISM   . JOINT EFFUSION, KNEE   . KNEE, ARTHRITIS, DEGEN./OSTEO    right knee   . Myocardial infarction ([place])    1994   . NEPHROLITHIASIS, HX OF   . OSTEOPOROSIS   . Other dysphagia   . PERIPHERAL VASCULAR DISEASE    AAA - 1994 with reimplant of renals   . Peripheral vascular disease ([facility])    subclavian stenosis PTA - 3/08   . Pure hypercholesterolemia   . Renal artery stenosis ([facility])     ALLERGIES:  is allergic to neurontin [gabapentin]; imitrex [sumatriptan]; and lipitor [atorvastatin].  MEDICATIONS:  Current Outpatient Medications  Medication Sig Dispense Refill  . acetaminophen (TYLENOL) 325 MG tablet Take 325-650 mg by mouth every 6 (six) hours as needed for headache (for pain).     [name] ALPRAZolam (XANAX) 0.25 MG tablet Take 1 tablet (0.25 mg total) by mouth 3 (three) times daily as needed for anxiety. 30 tablet 0  . bumetanide (BUMEX) 1 MG tablet Take 1 tablet (1 mg total) by mouth daily as needed (swelling). 30 tablet 1  . donepezil (ARICEPT) 5 MG tablet Take 1 tablet (5 mg total) by mouth at bedtime. 90 tablet 1  . lansoprazole (PREVACID) 15 MG capsule Take 1 capsule (15 mg total) by mouth daily. 30 capsule 5  . levothyroxine (SYNTHROID, LEVOTHROID)  112 MCG tablet Take 1 tablet (112 mcg total) by mouth daily. 90 tablet 1  . linaclotide (LINZESS) 145 MCG CAPS capsule Take 1 capsule (145 mcg total) by mouth daily before breakfast. 30 capsule 5  . magic mouthwash w/lidocaine SOLN Composition: 80 cc each of Extra St. Maalox Plus, Diphenhydramine, and Nystatin. Plus 240 cc of 2% viscous lidocaine to make 480 cc. Swish and swallow 2 tsp four times per day. 480 mL 2  . metoprolol tartrate (LOPRESSOR) 25 MG tablet TAKE ONE-HALF TABLET BY MOUTH TWO TIMES DAILY 30 tablet 5  . niacin (SLO-NIACIN) 500 MG tablet Take 3  tablets (1,500 mg total) by mouth at bedtime. 30 tablet 0  . nitroGLYCERIN (NITROSTAT) 0.4 MG SL tablet Place 1 tablet (0.4 mg total) under the tongue every 5 (five) minutes as needed for chest pain. 25 tablet 10  . ondansetron (ZOFRAN) 8 MG tablet Take 1 tablet (8 mg total) by mouth every 8 (eight) hours as needed for nausea or vomiting. 20 tablet 0  . potassium chloride SA (K-DUR,KLOR-CON) 20 MEQ tablet Take 1 tablet (20 mEq total) by mouth daily. 90 tablet 0  . prochlorperazine (COMPAZINE) 10 MG tablet Take 1 tablet (10 mg total) by mouth every 6 (six) hours as needed for nausea or vomiting. 30 tablet 0  . rosuvastatin (CRESTOR) 20 MG tablet Take 1 tablet (20 mg total) by mouth daily. 90 tablet 0  . sucralfate (CARAFATE) 1 GM/10ML suspension Take 10 mLs (1 g total) by mouth 4 (four) times daily -  with meals and at bedtime. 420 mL 1  . tamsulosin (FLOMAX) 0.4 MG CAPS capsule TAKE ONE CAPSULE BY MOUTH TWICE A DAY. 60 capsule 11  . warfarin (COUMADIN) 5 MG tablet TAKE 1 TABLET DAILY. TAKE AS DIRECTED BYPHYSICIAN 90 tablet 1   No current facility-administered medications for this visit.     SURGICAL HISTORY:  Past Surgical History:  Procedure Laterality Date  . ABDOMINAL AORTIC ANEURYSM REPAIR     with reimplantation of renals   . CARDIAC CATHETERIZATION     2008  . CAROTID ENDARTERECTOMY Left Jan. 30, 2015   CEA  . CHOLECYSTECTOMY  2000   Gall Bladder  . COLONOSCOPY W/ BIOPSIES AND POLYPECTOMY     Hx: of  . CORONARY ARTERY BYPASS GRAFT     1995  . CORONARY STENT PLACEMENT     Hx: of  . ENDARTERECTOMY Left 07/19/2013   Procedure: ENDARTERECTOMY CAROTID;  Surgeon: Mal Misty, MD;  Location: [facility];  Service: Vascular;  Laterality: Left;  . GALLBLADDER SURGERY  2004   . JOINT REPLACEMENT     partial knee replacement on left 2002   . KNEE SURGERY    . ORIF PELVIC FRACTURE Left 08/25/2016   Procedure: OPEN REDUCTION INTERNAL FIXATION (ORIF) PELVIC FRACTURE; plate on front, SI screw  on the back;  Surgeon: Altamese Mount Washington, MD;  Location: [facility];  Service: Orthopedics;  Laterality: Left;  . OTHER SURGICAL HISTORY     left subclavian stenosis surgery PTA 08/2006   . OTHER SURGICAL HISTORY     carotid surgery on right 2004   . TOTAL KNEE ARTHROPLASTY  04/29/2011   Procedure: TOTAL KNEE ARTHROPLASTY;  Surgeon: Gearlean Alf;  Location: [facility];  Service: Orthopedics;  Laterality: Right;  . urinary retention    . VASCULAR SURGERY     AAA  . VASECTOMY  1973  . VIDEO BRONCHOSCOPY WITH ENDOBRONCHIAL ULTRASOUND [name] 09/09/2016   Procedure: VIDEO BRONCHOSCOPY WITH  ENDOBRONCHIAL ULTRASOUND;  Surgeon: Grace Isaac, MD;  Location: [facility];  Service: Thoracic;  Laterality: [name];    REVIEW OF SYSTEMS:  A comprehensive review of systems was negative.   PHYSICAL EXAMINATION: General appearance: alert, cooperative and no distress Head: Normocephalic, without obvious abnormality, atraumatic Neck: no adenopathy, no JVD, supple, symmetrical, trachea midline and thyroid not enlarged, symmetric, no tenderness/mass/nodules Lymph nodes: Cervical, supraclavicular, and axillary nodes normal. Resp: clear to auscultation bilaterally Back: symmetric, no curvature. ROM normal. No CVA tenderness. Cardio: regular rate and rhythm, S1, S2 normal, no murmur, click, rub or gallop GI: soft, non-tender; bowel sounds normal; no masses,  no organomegaly Extremities: extremities normal, atraumatic, no cyanosis or edema  ECOG PERFORMANCE STATUS: 1 - Symptomatic but completely ambulatory  Blood pressure (!) 143/70, pulse 62, temperature 98 F (36.7 C), temperature source Oral, resp. rate 17, height 5\' 7"  (1.702 m), weight 131 lb 12.8 oz (59.8 kg), SpO2 98 %.  LABORATORY DATA: Lab Results  Component Value Date   WBC 5.0 01/03/2018   HGB 13.7 01/03/2018   HCT 39.7 01/03/2018   MCV 86.8 01/03/2018   PLT 136 (L) 01/03/2018      Chemistry      Component Value Date/Time   NA 135 12/20/2017 0919   NA 136  06/21/2017 1246   K 4.1 12/20/2017 0919   K 3.6 06/21/2017 1246   CL 104 12/20/2017 0919   CO2 25 12/20/2017 0919   CO2 25 06/21/2017 1246   BUN 17 12/20/2017 0919   BUN 14.0 06/21/2017 1246   CREATININE 0.85 12/20/2017 0919   CREATININE 0.8 06/21/2017 1246      Component Value Date/Time   CALCIUM 9.8 12/20/2017 0919   CALCIUM 9.4 06/21/2017 1246   ALKPHOS 134 (H) 12/20/2017 0919   ALKPHOS 139 06/21/2017 1246   AST 46 (H) 12/20/2017 0919   AST 18 06/21/2017 1246   ALT 43 12/20/2017 0919   ALT 17 06/21/2017 1246   BILITOT 0.5 12/20/2017 0919   BILITOT 0.41 06/21/2017 1246       RADIOGRAPHIC STUDIES: No results found.  ASSESSMENT AND PLAN:  This is a very pleasant 77 years old white male with a stage IIIa non-small cell lung cancer, adenocarcinoma with positive PDL 1 expression of 50%. The patient completed a course of concurrent chemoradiation with weekly carboplatin and paclitaxel status post 8 weeks and tolerated his treatment well except for mild dysphagia and odynophagia. The patient is currently undergoing consolidation immunotherapy with Imfinzi (Durvalumab) status post 25 cycles.   The patient continues to tolerate this treatment well with no concerning complaints.  I recommended for him to proceed with cycle #26 today as scheduled. I will see him back for follow-up visit in 3 weeks for evaluation after repeating CT scan of the chest for restaging of his disease after completion of the consolidation treatment with immunotherapy. He was advised to call immediately if he has any concerning symptoms in the interval. The patient voices understanding of current disease status and treatment options and is in agreement with the current care plan. All questions were answered. The patient knows to call the clinic with any problems, questions or concerns. We can certainly see the patient much sooner if necessary.  Disclaimer: This note was dictated with voice recognition software.  Similar sounding words can inadvertently be transcribed and may not be corrected upon review.

## 2018-01-03 NOTE — Telephone Encounter (Signed)
Scheduled appt per 7/17 los - left message for pt with appt date and time. Also sent reminder letter in the mail with appt date and time.

## 2018-01-05 ENCOUNTER — Other Ambulatory Visit: Payer: Self-pay

## 2018-01-05 ENCOUNTER — Ambulatory Visit (INDEPENDENT_AMBULATORY_CARE_PROVIDER_SITE_OTHER): Payer: Medicare Other

## 2018-01-05 DIAGNOSIS — I4891 Unspecified atrial fibrillation: Secondary | ICD-10-CM | POA: Diagnosis not present

## 2018-01-05 DIAGNOSIS — Z7901 Long term (current) use of anticoagulants: Secondary | ICD-10-CM

## 2018-01-05 LAB — POCT INR: INR: 2 (ref 2.0–3.0)

## 2018-01-05 MED ORDER — ROSUVASTATIN CALCIUM 20 MG PO TABS: 20.0000 mg | ORAL_TABLET | Freq: Every day | ORAL | 1 refills | Status: AC

## 2018-01-05 NOTE — Patient Instructions (Signed)
1.5 tablets on Sunday, Tuesday and Friday and one tablet Monday, Wednesday, Thursday and Saturday. Return 4 weeks for nurse visit INR.

## 2018-01-22 ENCOUNTER — Encounter (HOSPITAL_COMMUNITY): Payer: Self-pay

## 2018-01-22 ENCOUNTER — Inpatient Hospital Stay: Payer: Medicare Other | Attending: Nurse Practitioner

## 2018-01-22 ENCOUNTER — Ambulatory Visit (HOSPITAL_COMMUNITY)
Admission: RE | Admit: 2018-01-22 | Discharge: 2018-01-22 | Disposition: A | Discharge status: Home / Self Care | Payer: Medicare Other | Source: Ambulatory Visit | Attending: Internal Medicine | Admitting: Internal Medicine

## 2018-01-22 ENCOUNTER — Other Ambulatory Visit: Payer: Medicare Other

## 2018-01-22 DIAGNOSIS — E039 Hypothyroidism, unspecified: Secondary | ICD-10-CM | POA: Insufficient documentation

## 2018-01-22 DIAGNOSIS — C3490 Malignant neoplasm of unspecified part of unspecified bronchus or lung: Secondary | ICD-10-CM | POA: Diagnosis not present

## 2018-01-22 DIAGNOSIS — J439 Emphysema, unspecified: Secondary | ICD-10-CM | POA: Insufficient documentation

## 2018-01-22 DIAGNOSIS — C342 Malignant neoplasm of middle lobe, bronchus or lung: Secondary | ICD-10-CM

## 2018-01-22 DIAGNOSIS — Z85118 Personal history of other malignant neoplasm of bronchus and lung: Secondary | ICD-10-CM | POA: Diagnosis not present

## 2018-01-22 DIAGNOSIS — I700 Atherosclerosis of aorta: Secondary | ICD-10-CM | POA: Diagnosis not present

## 2018-01-22 LAB — CBC WITH DIFFERENTIAL (CANCER CENTER ONLY)
BASOS ABS: 0 10*3/uL (ref 0.0–0.1)
BASOS PCT: 0 %
Eosinophils Absolute: 0.1 10*3/uL (ref 0.0–0.5)
Eosinophils Relative: 1 %
HEMATOCRIT: 39.8 % (ref 38.4–49.9)
Hemoglobin: 13.9 g/dL (ref 13.0–17.1)
LYMPHS PCT: 19 %
Lymphs Abs: 1 10*3/uL (ref 0.9–3.3)
MCH: 30.6 pg (ref 27.2–33.4)
MCHC: 34.9 g/dL (ref 32.0–36.0)
MCV: 87.7 fL (ref 79.3–98.0)
Monocytes Absolute: 0.3 10*3/uL (ref 0.1–0.9)
Monocytes Relative: 5 %
NEUTROS ABS: 3.9 10*3/uL (ref 1.5–6.5)
NEUTROS PCT: 75 %
Platelet Count: 107 10*3/uL — ABNORMAL LOW (ref 140–400)
RBC: 4.54 MIL/uL (ref 4.20–5.82)
RDW: 14.9 % — AB (ref 11.0–14.6)
WBC: 5.3 10*3/uL (ref 4.0–10.3)

## 2018-01-22 LAB — CMP (CANCER CENTER ONLY)
ALBUMIN: 3.6 g/dL (ref 3.5–5.0)
ALT: 23 U/L (ref 0–44)
AST: 28 U/L (ref 15–41)
Alkaline Phosphatase: 120 U/L (ref 38–126)
Anion gap: 9 (ref 5–15)
BILIRUBIN TOTAL: 0.5 mg/dL (ref 0.3–1.2)
BUN: 16 mg/dL (ref 8–23)
CO2: 25 mmol/L (ref 22–32)
CREATININE: 0.89 mg/dL (ref 0.61–1.24)
Calcium: 9.3 mg/dL (ref 8.9–10.3)
Chloride: 104 mmol/L (ref 98–111)
GFR, Est AFR Am: >60 mL/min (ref >60)
GLUCOSE: 98 mg/dL (ref 70–99)
POTASSIUM: 4.2 mmol/L (ref 3.5–5.1)
Sodium: 138 mmol/L (ref 135–145)
TOTAL PROTEIN: 7.1 g/dL (ref 6.5–8.1)

## 2018-01-22 LAB — TSH: TSH: 0.862 u[IU]/mL (ref 0.320–4.118)

## 2018-01-22 MED ORDER — IOHEXOL 300 MG/ML  SOLN
75.0000 mL | Freq: Once | INTRAMUSCULAR | Status: AC | PRN
  Administered 2018-01-22: 75 mL via INTRAVENOUS

## 2018-01-23 ENCOUNTER — Telehealth: Payer: Self-pay | Admitting: Internal Medicine

## 2018-01-23 ENCOUNTER — Telehealth: Payer: Self-pay | Admitting: *Deleted

## 2018-01-23 NOTE — Telephone Encounter (Signed)
Called pt re appts - patient wanted to keep it on 8/8

## 2018-01-23 NOTE — Telephone Encounter (Signed)
Scheduling message sent to reschedule 15 min F/U on 01-25-2018 to first available.  Patient's mother-n-law expired.  Requested Friday 01-26-2018, yet no available appointments this day as provider not in office.     Expressed condolences with call and advised to proceed with funeral arrangements [name] reports are incomplete pending rescheduling.  "Also another funeral Thursday afternoon so he can't come in Thursday." [name] expecting return call to her cell: [phone] or home: [phone] with new appointment information.

## 2018-01-25 ENCOUNTER — Inpatient Hospital Stay: Payer: Medicare Other | Admitting: Oncology

## 2018-01-29 ENCOUNTER — Ambulatory Visit: Payer: Medicare Other | Admitting: Internal Medicine

## 2018-02-01 ENCOUNTER — Ambulatory Visit (INDEPENDENT_AMBULATORY_CARE_PROVIDER_SITE_OTHER): Payer: Medicare Other | Admitting: *Deleted

## 2018-02-01 DIAGNOSIS — Z7901 Long term (current) use of anticoagulants: Secondary | ICD-10-CM

## 2018-02-01 DIAGNOSIS — I6529 Occlusion and stenosis of unspecified carotid artery: Secondary | ICD-10-CM

## 2018-02-01 LAB — POCT INR: INR: 2 (ref 2.0–3.0)

## 2018-02-07 NOTE — Progress Notes (Signed)
[facility] OFFICE PROGRESS NOTE  [name] Kirschner, MD [address] [place] [address]  DIAGNOSIS: Stage IIIA (T1b, N2, M0) non-small cell lung cancer, adenocarcinoma with positive PDL 1 expression 50% diagnosed in March 2018 and presented with right upper lobe lung nodule in addition to mediastinal lymphadenopathy.  PRIOR THERAPY:  1) Course of concurrent chemoradiation with weekly carboplatin for AUC of 2 and paclitaxel 45 MG/M2. First dose of chemotherapy 10/17/2016. Status post 8 cycles. Last dose was given 12/05/2016 with partial response. 2) Consolidation treatment with immunotherapy with Imfinzi (Durvalumab) 10 MG/KG every 2 weeks. First dose 01/18/2017. Status post 25 cycles.  CURRENT THERAPY: Observation  INTERVAL HISTORY: [name] [name] 77 y.o. male returns for a routine follow-up visit accompanied by his wife.  The patient is feeling fine today and has no specific complaints except for left shoulder pain.  This is been hurting for several months secondary to an injury.  He is due to follow-up with orthopedics in the near future.  He denies fevers and chills.  Denies chest pain, shortness of breath, cough, hemoptysis.  Denies nausea, vomiting, constipation, diarrhea.  Denies recent weight loss or night sweats.  He had a recent restaging CT scan and is here to discuss the results.  MEDICAL HISTORY: Past Medical History:  Diagnosis Date  . Adenocarcinoma of right lung, stage 3 ([facility]) 09/20/2016  . Atrial fibrillation ([place] [facility])    no hx of reported at preop visit of 04/21/11   . CAD (coronary artery disease)   . Carotid artery occlusion    left carotid endarterectomy  . Chronic kidney disease    AAA repair with reimplant of renals   . CHRONIC OBSTRUCTIVE PULMONARY DISEASE    pt denied at visit of 04/21/11   . CORONARY ARTERY DISEASE   . Dehydration 11/15/2016  . Disorder of left sacroiliac joint 08/24/2016  . DIVERTICULOSIS OF COLON   . Encounter for  antineoplastic chemotherapy 10/17/2016  . GERD   . H/O hiatal hernia   . Headache(784.0)    Hx: of years of years  . HYPERTENSION   . HYPOTHYROIDISM   . JOINT EFFUSION, KNEE   . KNEE, ARTHRITIS, DEGEN./OSTEO    right knee   . Myocardial infarction ([facility])    1994   . NEPHROLITHIASIS, HX OF   . OSTEOPOROSIS   . Other dysphagia   . PERIPHERAL VASCULAR DISEASE    AAA - 1994 with reimplant of renals   . Peripheral vascular disease ([facility])    subclavian stenosis PTA - 3/08   . Pure hypercholesterolemia   . Renal artery stenosis ([facility])     ALLERGIES:  is allergic to neurontin [gabapentin]; imitrex [sumatriptan]; and lipitor [atorvastatin].  MEDICATIONS:  Current Outpatient Medications  Medication Sig Dispense Refill  . acetaminophen (TYLENOL) 325 MG tablet Take 325-650 mg by mouth every 6 (six) hours as needed for headache (for pain).     [name] donepezil (ARICEPT) 5 MG tablet Take 1 tablet (5 mg total) by mouth at bedtime. 90 tablet 1  . lansoprazole (PREVACID) 15 MG capsule Take 1 capsule (15 mg total) by mouth daily. 30 capsule 5  . levothyroxine (SYNTHROID, LEVOTHROID) 112 MCG tablet Take 1 tablet (112 mcg total) by mouth daily. 90 tablet 1  . magic mouthwash w/lidocaine SOLN Composition: 80 cc each of Extra St. Maalox Plus, Diphenhydramine, and Nystatin. Plus 240 cc of 2% viscous lidocaine to make 480 cc. Swish and swallow 2 tsp four times per day.  480 mL 2  . metoprolol tartrate (LOPRESSOR) 25 MG tablet TAKE ONE-HALF TABLET BY MOUTH TWO TIMES DAILY 30 tablet 5  . niacin (SLO-NIACIN) 500 MG tablet Take 3 tablets (1,500 mg total) by mouth at bedtime. 30 tablet 0  . potassium chloride SA (K-DUR,KLOR-CON) 20 MEQ tablet Take 1 tablet (20 mEq total) by mouth daily. 90 tablet 0  . rosuvastatin (CRESTOR) 20 MG tablet Take 1 tablet (20 mg total) by mouth daily. 90 tablet 1  . sucralfate (CARAFATE) 1 GM/10ML suspension Take 10 mLs (1 g total) by mouth 4 (four) times daily -  with meals and at  bedtime. 420 mL 1  . tamsulosin (FLOMAX) 0.4 MG CAPS capsule TAKE ONE CAPSULE BY MOUTH TWICE A DAY. 60 capsule 11  . warfarin (COUMADIN) 5 MG tablet TAKE 1 TABLET DAILY. TAKE AS DIRECTED BYPHYSICIAN 90 tablet 1  . ALPRAZolam (XANAX) 0.25 MG tablet Take 1 tablet (0.25 mg total) by mouth 3 (three) times daily as needed for anxiety. (Patient not taking: Reported on 02/08/2018) 30 tablet 0  . bumetanide (BUMEX) 1 MG tablet Take 1 tablet (1 mg total) by mouth daily as needed (swelling). (Patient not taking: Reported on 02/08/2018) 30 tablet 1  . linaclotide (LINZESS) 145 MCG CAPS capsule Take 1 capsule (145 mcg total) by mouth daily before breakfast. (Patient not taking: Reported on 02/08/2018) 30 capsule 5  . nitroGLYCERIN (NITROSTAT) 0.4 MG SL tablet Place 1 tablet (0.4 mg total) under the tongue every 5 (five) minutes as needed for chest pain. (Patient not taking: Reported on 02/08/2018) 25 tablet 10  . ondansetron (ZOFRAN) 8 MG tablet Take 1 tablet (8 mg total) by mouth every 8 (eight) hours as needed for nausea or vomiting. (Patient not taking: Reported on 02/08/2018) 20 tablet 0  . prochlorperazine (COMPAZINE) 10 MG tablet Take 1 tablet (10 mg total) by mouth every 6 (six) hours as needed for nausea or vomiting. (Patient not taking: Reported on 02/08/2018) 30 tablet 0   No current facility-administered medications for this visit.     SURGICAL HISTORY:  Past Surgical History:  Procedure Laterality Date  . ABDOMINAL AORTIC ANEURYSM REPAIR     with reimplantation of renals   . CARDIAC CATHETERIZATION     2008  . CAROTID ENDARTERECTOMY Left Jan. 30, 2015   CEA  . CHOLECYSTECTOMY  2000   Gall Bladder  . COLONOSCOPY W/ BIOPSIES AND POLYPECTOMY     Hx: of  . CORONARY ARTERY BYPASS GRAFT     1995  . CORONARY STENT PLACEMENT     Hx: of  . ENDARTERECTOMY Left 07/19/2013   Procedure: ENDARTERECTOMY CAROTID;  Surgeon: Mal Misty, MD;  Location: [facility];  Service: Vascular;  Laterality: Left;  .  GALLBLADDER SURGERY  2004   . JOINT REPLACEMENT     partial knee replacement on left 2002   . KNEE SURGERY    . ORIF PELVIC FRACTURE Left 08/25/2016   Procedure: OPEN REDUCTION INTERNAL FIXATION (ORIF) PELVIC FRACTURE; plate on front, SI screw on the back;  Surgeon: Altamese Ettrick, MD;  Location: [facility];  Service: Orthopedics;  Laterality: Left;  . OTHER SURGICAL HISTORY     left subclavian stenosis surgery PTA 08/2006   . OTHER SURGICAL HISTORY     carotid surgery on right 2004   . TOTAL KNEE ARTHROPLASTY  04/29/2011   Procedure: TOTAL KNEE ARTHROPLASTY;  Surgeon: Gearlean Alf;  Location: [facility];  Service: Orthopedics;  Laterality: Right;  . urinary retention    .  VASCULAR SURGERY     AAA  . VASECTOMY  1973  . VIDEO BRONCHOSCOPY WITH ENDOBRONCHIAL ULTRASOUND [name] 09/09/2016   Procedure: VIDEO BRONCHOSCOPY WITH ENDOBRONCHIAL ULTRASOUND;  Surgeon: Grace Isaac, MD;  Location: [facility];  Service: Thoracic;  Laterality: [name];    REVIEW OF SYSTEMS:   Review of Systems  Constitutional: Negative for appetite change, chills, fatigue, fever and unexpected weight change.  HENT:   Negative for mouth sores, nosebleeds, sore throat and trouble swallowing.   Eyes: Negative for eye problems and icterus.  Respiratory: Negative for cough, hemoptysis, shortness of breath and wheezing.   Cardiovascular: Negative for chest pain and leg swelling.  Gastrointestinal: Negative for abdominal pain, constipation, diarrhea, nausea and vomiting.  Genitourinary: Negative for bladder incontinence, difficulty urinating, dysuria, frequency and hematuria.   Musculoskeletal: Negative for back pain, gait problem, neck pain and neck stiffness.  Positive for left shoulder pain. Skin: Negative for itching and rash.  Neurological: Negative for dizziness, extremity weakness, gait problem, headaches, light-headedness and seizures.  Hematological: Negative for adenopathy. Does not bruise/bleed easily.  Psychiatric/Behavioral:  Negative for confusion, depression and sleep disturbance. The patient is not nervous/anxious.     PHYSICAL EXAMINATION:  Blood pressure (!) 119/53, pulse 74, temperature 97.8 F (36.6 C), temperature source Oral, resp. rate 18, height 5\' 7"  (1.702 m), weight 132 lb 8 oz (60.1 kg), SpO2 98 %.  ECOG PERFORMANCE STATUS: 1 - Symptomatic but completely ambulatory  Physical Exam  Constitutional: Oriented to person, place, and time and well-developed, well-nourished, and in no distress. No distress.  HENT:  Head: Normocephalic and atraumatic.  Mouth/Throat: Oropharynx is clear and moist. No oropharyngeal exudate.  Eyes: Conjunctivae are normal. Right eye exhibits no discharge. Left eye exhibits no discharge. No scleral icterus.  Neck: Normal range of motion. Neck supple.  Cardiovascular: Normal rate, regular rhythm, normal heart sounds and intact distal pulses.   Pulmonary/Chest: Effort normal and breath sounds normal. No respiratory distress. No wheezes. No rales.  Abdominal: Soft. Bowel sounds are normal. Exhibits no distension and no mass. There is no tenderness.  Musculoskeletal: Normal range of motion. Exhibits no edema.  Lymphadenopathy:    No cervical adenopathy.  Neurological: Alert and oriented to person, place, and time. Exhibits normal muscle tone. Gait normal. Coordination normal.  Skin: Skin is warm and dry. No rash noted. Not diaphoretic. No erythema. No pallor.  Psychiatric: Mood, memory and judgment normal.  Vitals reviewed.  LABORATORY DATA: Lab Results  Component Value Date   WBC 5.3 01/22/2018   HGB 13.9 01/22/2018   HCT 39.8 01/22/2018   MCV 87.7 01/22/2018   PLT 107 (L) 01/22/2018      Chemistry      Component Value Date/Time   NA 138 01/22/2018 1019   NA 136 06/21/2017 1246   K 4.2 01/22/2018 1019   K 3.6 06/21/2017 1246   CL 104 01/22/2018 1019   CO2 25 01/22/2018 1019   CO2 25 06/21/2017 1246   BUN 16 01/22/2018 1019   BUN 14.0 06/21/2017 1246    CREATININE 0.89 01/22/2018 1019   CREATININE 0.8 06/21/2017 1246      Component Value Date/Time   CALCIUM 9.3 01/22/2018 1019   CALCIUM 9.4 06/21/2017 1246   ALKPHOS 120 01/22/2018 1019   ALKPHOS 139 06/21/2017 1246   AST 28 01/22/2018 1019   AST 18 06/21/2017 1246   ALT 23 01/22/2018 1019   ALT 17 06/21/2017 1246   BILITOT 0.5 01/22/2018 1019   BILITOT 0.41  06/21/2017 1246       RADIOGRAPHIC STUDIES:  Ct Chest W Contrast  Result Date: 01/22/2018 CLINICAL DATA:  Follow-up lung cancer. EXAM: CT CHEST WITH CONTRAST TECHNIQUE: Multidetector CT imaging of the chest was performed during intravenous contrast administration. CONTRAST:  5mL OMNIPAQUE IOHEXOL 300 MG/ML  SOLN COMPARISON:  09/26/2017 FINDINGS: Cardiovascular: Aortic atherosclerosis. Previous median sternotomy CABG procedure. Extensive calcifications within the native coronary arteries noted. Mediastinum/Nodes: The trachea appears patent and is midline. Normal appearance of the esophagus. Right supraclavicular lymph node measures 11 mm short axis, image 16/2. Previously 0.6 cm. High right paratracheal lymph node measures 0.9 cm, image 43/2. Previously 0.7 cm. Adjacent right paratracheal lymph node is also increased in size. Currently 0.8 cm. Previously 0.5 cm. The index subcarinal lymph node is stable measuring 0.9 cm, image 75/2. Lungs/Pleura: No pleural effusions. Mild changes of emphysema. Diffuse bronchial wall thickening. Paramediastinal fibrosis and masslike architectural distortion within the right lung appears similar to previous exam compatible with changes of external beam radiation. The right upper lobe pulmonary nodule is stable measuring 4 mm, image 37/7. Tiny 2-3 mm right upper lobe lung nodule is stable, image 76/7. Upper Abdomen: No acute abnormality. Musculoskeletal: No chest wall abnormality. No acute or significant osseous findings. Unchanged appearance of mild midthoracic spine compression deformities. IMPRESSION: 1.  Stable appearance of radiation change in the right lung. No findings to suggest local tumor recurrence. 2. Interval increase in size of right supraclavicular and high right paratracheal lymph nodes as noted previously. The appearance is nonspecific. Metastatic adenopathy cannot be excluded. 3. Aortic Atherosclerosis (ICD10-I70.0) and Emphysema (ICD10-J43.9). Electronically Signed   By: Kerby Moors M.D.   On: 01/22/2018 14:34     ASSESSMENT/PLAN:  T1b N2 M0 disease (stage IIIA).adenocarcinoma of the right middle lobe of lung [facility]) This is a very pleasant 77 year old white male with a stage IIIa non-small cell lung cancer, adenocarcinoma with positive PDL 1 expression of 50%. The patient completed a course of concurrent chemoradiation with weekly carboplatin and paclitaxel status post 8 weeks and tolerated his treatment well except for mild dysphagia and odynophagia. The patient is currently undergoing consolidation immunotherapy with Imfinzi (Durvalumab) status post 26 cycles.   He tolerated his treatment well overall. He had a recent restaging CT scan and is here to discuss the results.  The patient was seen with Dr. Earlie Server.  CT scan results were discussed with the patient and his wife which show stable disease.  There is a slight increase in the size of the right supraclavicular and right paratracheal lymph nodes which is nonspecific.  We will continue to monitor this on future scans. The patient will continue on observation.  He will follow-up in 3 months with a CT scan of the chest prior to his next visit.   He was advised to call immediately if he has any concerning symptoms in the interval. The patient voices understanding of current disease status and treatment options and is in agreement with the current care plan. All questions were answered. The patient knows to call the clinic with any problems, questions or concerns. We can certainly see the patient much sooner if  necessary.   Orders Placed This Encounter  Procedures  . CT CHEST W CONTRAST    Standing Status:   Future    Standing Expiration Date:   02/09/2019    Order Specific Question:   If indicated for the ordered procedure, I authorize the administration of contrast media per Radiology protocol  Answer:   Yes    Order Specific Question:   Preferred imaging location?    Answer:   [facility]    Order Specific Question:   Radiology Contrast Protocol - do NOT remove file path    Answer:   [email or web address]    Order Specific Question:   ** REASON FOR EXAM (FREE TEXT)    Answer:   Lung cancer. Restaging.  [name] CBC with Differential (Cancer Center Only)    Standing Status:   Future    Standing Expiration Date:   02/09/2019  . CMP ([facility] only)    Standing Status:   Future    Standing Expiration Date:   02/09/2019     [name] Bussing, DNP, AGPCNP-BC, AOCNP  02/08/18   ADDENDUM: Hematology/Oncology Attending: I had a face-to-face encounter with the patient.  I recommended his care plan.  This is a very pleasant 77 years old white male with a stage IIIa non-small cell lung cancer status post a course of concurrent chemoradiation with weekly carboplatin and paclitaxel with partial response.  This was followed by consolidation treatment with immunotherapy with Imfinzi (Durvalumab) status post 26 cycles and has been tolerating this treatment well. He is currently on observation and feeling fine.  Repeat CT scan of the chest was performed recently. I personally and independently reviewed the scans and discussed the results with the patient and his wife.  His a scan showed no concerning findings for disease progression except for mild increase of right supraclavicular and high right paratracheal lymph node. I recommended for the patient to continue on observation with repeat CT scan of the chest in 3 months.  He was advised to call immediately if he has any  concerning symptoms in the interval.  Disclaimer: This note was dictated with voice recognition software. Similar sounding words can inadvertently be transcribed and may be missed upon review. Eilleen Kempf, MD 02/10/18

## 2018-02-07 NOTE — Assessment & Plan Note (Addendum)
This is a very pleasant 77 year old white male with a stage IIIa non-small cell lung cancer, adenocarcinoma with positive PDL 1 expression of 50%. The patient completed a course of concurrent chemoradiation with weekly carboplatin and paclitaxel status post 8 weeks and tolerated his treatment well except for mild dysphagia and odynophagia. The patient is currently undergoing consolidation immunotherapy with Imfinzi (Durvalumab) status post 26 cycles.  He tolerated his treatment well overall. He had a recent restaging CT scan and is here to discuss the results.  The patient was seen with Dr. Earlie Server.  CT scan results were discussed with the patient and his wife which show stable disease.  There is a slight increase in the size of the right supraclavicular and right paratracheal lymph nodes which is nonspecific.  We will continue to monitor this on future scans. The patient will continue on observation.  He will follow-up in 3 months with a CT scan of the chest prior to his next visit.   He was advised to call immediately if he has any concerning symptoms in the interval. The patient voices understanding of current disease status and treatment options and is in agreement with the current care plan. All questions were answered. The patient knows to call the clinic with any problems, questions or concerns. We can certainly see the patient much sooner if necessary.

## 2018-02-08 ENCOUNTER — Inpatient Hospital Stay (HOSPITAL_BASED_OUTPATIENT_CLINIC_OR_DEPARTMENT_OTHER): Payer: Medicare Other | Admitting: Oncology

## 2018-02-08 ENCOUNTER — Encounter: Payer: Self-pay | Admitting: Oncology

## 2018-02-08 ENCOUNTER — Telehealth: Payer: Self-pay

## 2018-02-08 ENCOUNTER — Other Ambulatory Visit: Payer: Self-pay

## 2018-02-08 VITALS — BP 119/53 | HR 74 | Temp 97.8°F | Resp 18 | Ht 67.0 in | Wt 132.5 lb

## 2018-02-08 DIAGNOSIS — Z85118 Personal history of other malignant neoplasm of bronchus and lung: Secondary | ICD-10-CM

## 2018-02-08 DIAGNOSIS — E039 Hypothyroidism, unspecified: Secondary | ICD-10-CM

## 2018-02-08 DIAGNOSIS — C342 Malignant neoplasm of middle lobe, bronchus or lung: Secondary | ICD-10-CM

## 2018-02-08 NOTE — Telephone Encounter (Signed)
Printed avs and calender of upcoming appointment. Per 8/20 los

## 2018-02-09 ENCOUNTER — Encounter: Payer: Self-pay | Admitting: Orthopedic Surgery

## 2018-02-09 ENCOUNTER — Ambulatory Visit (INDEPENDENT_AMBULATORY_CARE_PROVIDER_SITE_OTHER): Payer: Medicare Other

## 2018-02-09 ENCOUNTER — Ambulatory Visit (INDEPENDENT_AMBULATORY_CARE_PROVIDER_SITE_OTHER): Payer: Medicare Other | Admitting: Orthopedic Surgery

## 2018-02-09 VITALS — BP 124/69 | HR 74 | Ht 67.0 in | Wt 129.0 lb

## 2018-02-09 DIAGNOSIS — G8929 Other chronic pain: Secondary | ICD-10-CM | POA: Diagnosis not present

## 2018-02-09 DIAGNOSIS — M75102 Unspecified rotator cuff tear or rupture of left shoulder, not specified as traumatic: Secondary | ICD-10-CM

## 2018-02-09 DIAGNOSIS — I6529 Occlusion and stenosis of unspecified carotid artery: Secondary | ICD-10-CM | POA: Diagnosis not present

## 2018-02-09 DIAGNOSIS — M25512 Pain in left shoulder: Secondary | ICD-10-CM

## 2018-02-09 NOTE — Progress Notes (Signed)
NEW PATIENT OFFICE VISI  Chief Complaint  Patient presents with  . Shoulder Pain    left    77 year old male presents with 64-month history of atraumatic onset of left shoulder pain.  He says he was putting his coat on fell into a coat hook and since that time is has pain over the anterolateral aspect of his left shoulder which interferes with his ability to raise his arm and also causes him pain when he lies down on his left side or rolls onto it  Location of pain left shoulder quality dull ache sharp at times, severity mild but moderate with activity, timing intermittent duration 2 months context trauma modifying factors lifting his arm over his head which is difficult   Review of Systems  Constitutional: Positive for malaise/fatigue and weight loss.  Musculoskeletal: Positive for joint pain and neck pain.  Neurological: Negative for tingling.     Past Medical History:  Diagnosis Date  . Adenocarcinoma of right lung, stage 3 ([place]) 09/20/2016  . Atrial fibrillation ([place])    no hx of reported at preop visit of 04/21/11   . CAD (coronary artery disease)   . Carotid artery occlusion    left carotid endarterectomy  . Chronic kidney disease    AAA repair with reimplant of renals   . CHRONIC OBSTRUCTIVE PULMONARY DISEASE    pt denied at visit of 04/21/11   . CORONARY ARTERY DISEASE   . Dehydration 11/15/2016  . Disorder of left sacroiliac joint 08/24/2016  . DIVERTICULOSIS OF COLON   . Encounter for antineoplastic chemotherapy 10/17/2016  . GERD   . H/O hiatal hernia   . Headache(784.0)    Hx: of years of years  . HYPERTENSION   . HYPOTHYROIDISM   . JOINT EFFUSION, KNEE   . KNEE, ARTHRITIS, DEGEN./OSTEO    right knee   . Myocardial infarction ([place])    1994   . NEPHROLITHIASIS, HX OF   . OSTEOPOROSIS   . Other dysphagia   . PERIPHERAL VASCULAR DISEASE    AAA - 1994 with reimplant of renals   . Peripheral vascular disease ([place])    subclavian stenosis PTA - 3/08   . Pure  hypercholesterolemia   . Renal artery stenosis [facility])     Past Surgical History:  Procedure Laterality Date  . ABDOMINAL AORTIC ANEURYSM REPAIR     with reimplantation of renals   . CARDIAC CATHETERIZATION     2008  . CAROTID ENDARTERECTOMY Left Jan. 30, 2015   CEA  . CHOLECYSTECTOMY  2000   Gall Bladder  . COLONOSCOPY W/ BIOPSIES AND POLYPECTOMY     Hx: of  . CORONARY ARTERY BYPASS GRAFT     1995  . CORONARY STENT PLACEMENT     Hx: of  . ENDARTERECTOMY Left 07/19/2013   Procedure: ENDARTERECTOMY CAROTID;  Surgeon: Mal Misty, MD;  Location: [facility];  Service: Vascular;  Laterality: Left;  . GALLBLADDER SURGERY  2004   . JOINT REPLACEMENT     partial knee replacement on left 2002   . KNEE SURGERY    . ORIF PELVIC FRACTURE Left 08/25/2016   Procedure: OPEN REDUCTION INTERNAL FIXATION (ORIF) PELVIC FRACTURE; plate on front, SI screw on the back;  Surgeon: Altamese Melcher-Dallas, MD;  Location: [place];  Service: Orthopedics;  Laterality: Left;  . OTHER SURGICAL HISTORY     left subclavian stenosis surgery PTA 08/2006   . OTHER SURGICAL HISTORY     carotid surgery on right 2004   .  TOTAL KNEE ARTHROPLASTY  04/29/2011   Procedure: TOTAL KNEE ARTHROPLASTY;  Surgeon: Gearlean Alf;  [facility];  Service: Orthopedics;  Laterality: Right;  . urinary retention    . VASCULAR SURGERY     AAA  . VASECTOMY  1973  . VIDEO BRONCHOSCOPY WITH ENDOBRONCHIAL ULTRASOUND [name] 09/09/2016   Procedure: VIDEO BRONCHOSCOPY WITH ENDOBRONCHIAL ULTRASOUND;  Surgeon: Grace Isaac, MD;  Location: [facility];  Service: Thoracic;  Laterality: [name];    Family History  Problem Relation Age of Onset  . Hypertension Mother   . Stroke Mother   . Heart disease Father   . Heart attack Father   . Diabetes Sister   . Cancer Brother 92       Lung  . Diabetes Sister    Social History   Tobacco Use  . Smoking status: Former Smoker    Types: Cigarettes    Last attempt to quit: 06/20/1992    Years since quitting:  25.6  . Smokeless tobacco: Never Used  Substance Use Topics  . Alcohol use: No    Alcohol/week: 0.0 standard drinks  . Drug use: No    Allergies  Allergen Reactions  . Neurontin [Gabapentin] Other (See Comments)    Caused confusion  . Imitrex [Sumatriptan] Nausea And Vomiting and Other (See Comments)    "made me like I was having a stoke"  . Lipitor [Atorvastatin] Nausea And Vomiting and Other (See Comments)    INTOLERANCE > MYALGIAS "couldn't get out of the bed ( pt had to crawl out of bed ) I hurt so bad"    Current Meds  Medication Sig  . acetaminophen (TYLENOL) 325 MG tablet Take 325-650 mg by mouth every 6 (six) hours as needed for headache (for pain).   [name] donepezil (ARICEPT) 5 MG tablet Take 1 tablet (5 mg total) by mouth at bedtime.  . lansoprazole (PREVACID) 15 MG capsule Take 1 capsule (15 mg total) by mouth daily.  [name] levothyroxine (SYNTHROID, LEVOTHROID) 112 MCG tablet Take 1 tablet (112 mcg total) by mouth daily.  . metoprolol tartrate (LOPRESSOR) 25 MG tablet TAKE ONE-HALF TABLET BY MOUTH TWO TIMES DAILY  . niacin (SLO-NIACIN) 500 MG tablet Take 3 tablets (1,500 mg total) by mouth at bedtime.  . potassium chloride SA (K-DUR,KLOR-CON) 20 MEQ tablet Take 1 tablet (20 mEq total) by mouth daily.  . rosuvastatin (CRESTOR) 20 MG tablet Take 1 tablet (20 mg total) by mouth daily.  . sucralfate (CARAFATE) 1 GM/10ML suspension Take 10 mLs (1 g total) by mouth 4 (four) times daily -  with meals and at bedtime.  . tamsulosin (FLOMAX) 0.4 MG CAPS capsule TAKE ONE CAPSULE BY MOUTH TWICE A DAY.  [name] warfarin (COUMADIN) 5 MG tablet TAKE 1 TABLET DAILY. TAKE AS DIRECTED BYPHYSICIAN    BP 124/69   Pulse 74   Ht 5\' 7"  (1.702 m)   Wt 129 lb (58.5 kg)   BMI 20.20 kg/m   Physical Exam  Constitutional: He is oriented to person, place, and time. He appears well-developed and well-nourished.  Vital signs have been reviewed and are stable. Gen. appearance the patient is well-developed and  well-nourished with normal grooming and hygiene.   Neurological: He is alert and oriented to person, place, and time.  Skin: Skin is warm and dry. No erythema.  Psychiatric: He has a normal mood and affect.  Vitals reviewed.   Ortho Exam  Right upper extremity alignment is normal there is no tenderness or swelling around  his shoulder he exhibits full range of motion in all planes and is stable in abduction external rotation with a negative sulcus sign as well his cuff strength is normal to manual muscle testing his skin is warm dry and intact he has a good pulse normal temperature no edema and no lymph nodes.  Sensation remains normal as well  On the left side he has tenderness over the acromioclavicular joint both AC joint showed prominent distal clavicles.  However he is also tender over the deltoid laterally as well as anteriorly including the rotator interval and greater tuberosity.  His active range of motion is limited to 90 degrees of flexion and 90 degrees abduction with weakness in abduction and flexion although passive range of motion is painful I can reach his arm up 280 degrees shoulder stable in abduction external rotation as well as a normal sulcus sign again he has weakness in abduction and forward elevation scapular plane.  The skin is warm dry and intact the pulses are normal and sensation is excellent  As a positive impingement sign.  His drop test shows giveaway weakness  MEDICAL DECISION SECTION  Xrays were done at [facility]  My independent reading of xrays:  Proximal migration of the humeral head without joint space narrowing type I acromion please see report  Encounter Diagnoses  Name Primary?  . Chronic pain in left shoulder Yes  . Nontraumatic tear of left rotator cuff, unspecified tear extent     PLAN: (Rx., injectx, surgery, frx, mri/ct) The patient has carotid stenosis followed by vascular service he also is on Coumadin he also has cancer  he is probably not a great surgical candidate for repair and is probably a chronic tear which would require a reverse prosthesis  Recommend subacromial injection physical therapy come back in 5 weeks see if we got any improvement    Arther Abbott, MD  02/09/2018 12:25 PM

## 2018-02-12 ENCOUNTER — Ambulatory Visit: Payer: Medicare Other | Admitting: Oncology

## 2018-02-28 ENCOUNTER — Other Ambulatory Visit: Payer: Self-pay

## 2018-02-28 ENCOUNTER — Ambulatory Visit (HOSPITAL_COMMUNITY): Payer: Medicare Other | Attending: Orthopedic Surgery

## 2018-02-28 DIAGNOSIS — M25612 Stiffness of left shoulder, not elsewhere classified: Secondary | ICD-10-CM | POA: Insufficient documentation

## 2018-02-28 DIAGNOSIS — M25512 Pain in left shoulder: Secondary | ICD-10-CM | POA: Insufficient documentation

## 2018-02-28 DIAGNOSIS — R29898 Other symptoms and signs involving the musculoskeletal system: Secondary | ICD-10-CM | POA: Diagnosis not present

## 2018-02-28 NOTE — Patient Instructions (Signed)
Complete the following exercises 2-3 times a day.    Scapular Retraction (Standing)   With arms at sides, pinch shoulder blades together. Repeat __10__ times per set. Do __1__ sets per session. Do __2__ sessions per day.  http://orth.exer.us/944   Copyright  VHI. All rights reserved.   Internal Rotation Across Back  Grab the end of a towel with your affected side, palm facing backwards. Grab the towel with your unaffected side and pull your affected hand across your back until you feel a stretch in the front of your shoulder. If you feel pain, pull just to the pain, do not pull through the pain. Hold. Return your affected arm to your side. Try to keep your hand/arm close to your body during the entire movement.     Hold for 10-15 seconds. Complete 2 times.           Wall Flexion  Slide your arm up the wall or door frame until a stretch is felt in your shoulder . Hold for 10-15 seconds. Complete 2 times     Shoulder Abduction Stretch  Stand side ways by a wall with affected up on wall. Gently step in toward wall to feel stretch. Hold for 10-15 seconds. Complete 2 times.

## 2018-02-28 NOTE — Therapy (Signed)
[place], [place], [address] Phone: [phone]   Fax:  [phone]  Occupational Therapy Evaluation  Patient Details  Name: [name] [name] MRN: [id number] Date of Birth: [birth year] Referring Provider: Dr. Arther Abbott   Encounter Date: 02/28/2018  OT End of Session - 02/28/18 1059    Visit Number  1    Number of Visits  9    Date for OT Re-Evaluation  03/21/18    Authorization Type  1) Medicare 2) BSBC    OT Start Time  0910    OT Stop Time  0945    OT Time Calculation (min)  35 min    Activity Tolerance  Patient tolerated treatment well    Behavior During Therapy  [facility] for tasks assessed/performed       Past Medical History:  Diagnosis Date  . Adenocarcinoma of right lung, stage 3 ([place]) 09/20/2016  . Atrial fibrillation ([place])    no hx of reported at preop visit of 04/21/11   . CAD (coronary artery disease)   . Carotid artery occlusion    left carotid endarterectomy  . Chronic kidney disease    AAA repair with reimplant of renals   . CHRONIC OBSTRUCTIVE PULMONARY DISEASE    pt denied at visit of 04/21/11   . CORONARY ARTERY DISEASE   . Dehydration 11/15/2016  . Disorder of left sacroiliac joint 08/24/2016  . DIVERTICULOSIS OF COLON   . Encounter for antineoplastic chemotherapy 10/17/2016  . GERD   . H/O hiatal hernia   . Headache(784.0)    Hx: of years of years  . HYPERTENSION   . HYPOTHYROIDISM   . JOINT EFFUSION, KNEE   . KNEE, ARTHRITIS, DEGEN./OSTEO    right knee   . Myocardial infarction ([place])    1994   . NEPHROLITHIASIS, HX OF   . OSTEOPOROSIS   . Other dysphagia   . PERIPHERAL VASCULAR DISEASE    AAA - 1994 with reimplant of renals   . Peripheral vascular disease ([place])    subclavian stenosis PTA - 3/08   . Pure hypercholesterolemia   . Renal artery stenosis [facility])     Past Surgical History:  Procedure Laterality Date  . ABDOMINAL AORTIC ANEURYSM REPAIR     with reimplantation of  renals   . CARDIAC CATHETERIZATION     2008  . CAROTID ENDARTERECTOMY Left Jan. 30, 2015   CEA  . CHOLECYSTECTOMY  2000   Gall Bladder  . COLONOSCOPY W/ BIOPSIES AND POLYPECTOMY     Hx: of  . CORONARY ARTERY BYPASS GRAFT     1995  . CORONARY STENT PLACEMENT     Hx: of  . ENDARTERECTOMY Left 07/19/2013   Procedure: ENDARTERECTOMY CAROTID;  Surgeon: Mal Misty, MD;  Location: [facility];  Service: Vascular;  Laterality: Left;  . GALLBLADDER SURGERY  2004   . JOINT REPLACEMENT     partial knee replacement on left 2002   . KNEE SURGERY    . ORIF PELVIC FRACTURE Left 08/25/2016   Procedure: OPEN REDUCTION INTERNAL FIXATION (ORIF) PELVIC FRACTURE; plate on front, SI screw on the back;  Surgeon: Altamese Wahpeton, MD;  Location: [facility];  Service: Orthopedics;  Laterality: Left;  . OTHER SURGICAL HISTORY     left subclavian stenosis surgery PTA 08/2006   . OTHER SURGICAL HISTORY     carotid surgery on right 2004   . TOTAL KNEE ARTHROPLASTY  04/29/2011   Procedure: TOTAL KNEE ARTHROPLASTY;  Surgeon: Dione Plover [name];  [facility];  Service: Orthopedics;  Laterality: Right;  . urinary retention    . VASCULAR SURGERY     AAA  . VASECTOMY  1973  . VIDEO BRONCHOSCOPY WITH ENDOBRONCHIAL ULTRASOUND [name] 09/09/2016   Procedure: VIDEO BRONCHOSCOPY WITH ENDOBRONCHIAL ULTRASOUND;  Surgeon: Grace Isaac, MD;  Location: [facility];  Service: Thoracic;  Laterality: [name];    There were no vitals filed for this visit.  Subjective Assessment - 02/28/18 0914    Subjective   S: I was putting my jacket on and the coat hook hit me right in the shoulder.     Pertinent History  Patient is a 77 y/o male S/P left shoulder pain which occured approximately 2-3 weeks ago when a coat hook hit his left lateral anterior shoulder region. An X-ray was performed and no fracture is although there is possible presence of rotator cuff disease which is typical for his age.  Dr. Aline Brochure has referred patient to occupational therapy  for evaluation and treatment.     Special Tests  Complete FOTO next session.     Patient Stated Goals  To be able to use his left arm as close to normal as possible.     Currently in Pain?  Yes    Pain Score  9     Pain Location  Shoulder    Pain Orientation  Left    Pain Descriptors / Indicators  Crying;Shooting    Pain Type  Acute pain    Pain Radiating Towards  through entire arm    Pain Onset  More than a month ago    Pain Frequency  Intermittent    Aggravating Factors   Trying to move and use it.     Pain Relieving Factors  Resting.     Effect of Pain on Daily Activities  Pt unable to use LUE for any daily tasks.     Multiple Pain Sites  No        [facility] OT Assessment - 02/28/18 0917      Assessment   Medical Diagnosis  Left shoulder pain    Referring Provider  Dr. 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 Unable to buckle pants on left side.       Mobility   Mobility Status  Independent      Written Expression   Dominant Hand  Right      Vision - History   Baseline Vision  No visual deficits      Cognition   Overall Cognitive Status  Within Functional Limits for tasks assessed      Observation/Other Assessments   Observations  Gerber test: - Belly press test: +    Focus on Therapeutic Outcomes (FOTO)   Complete  next session      Posture/Postural Control   Posture/Postural Control  Postural limitations    Postural Limitations  Rounded Shoulders;Forward head;Flexed trunk      ROM / Strength   AROM / PROM / Strength  AROM;PROM;Strength      Palpation   Palpation comment  No fascial restrictions palpated in left upper arm, trapezius, and scapularis region.       AROM   Overall AROM Comments  Assessed seated. IR/er adducted   RUE A/ROM WNL in all ranges   AROM Assessment Site  Shoulder    Right/Left Shoulder  Left    Left Shoulder Flexion  125 Degrees    Left Shoulder ABduction  96 Degrees    Left Shoulder Internal Rotation  90 Degrees    Left Shoulder External Rotation  62 Degrees      PROM   Overall PROM Comments  Assessed supine. IR/er adducted    PROM Assessment Site  Shoulder    Right/Left Shoulder  Left    Left Shoulder Flexion  158 Degrees    Left Shoulder ABduction  180 Degrees    Left Shoulder Internal Rotation  90 Degrees    Left Shoulder External Rotation  70 Degrees      Strength   Overall Strength Comments  Assessed seated. IR/er adducted.   RUE strength: 5/5 in all ranges.    Strength Assessment Site  Shoulder    Right/Left Shoulder  Left    Left Shoulder Flexion  3/5    Left Shoulder ABduction  3-/5    Left Shoulder Internal Rotation  4/5    Left Shoulder External Rotation  5/5                      OT Education - 02/28/18 1024    Education Details  Shoulder stretches: flexion, abduction, internal rotation, scapular retraction    Person(s) Educated  Patient    Methods  Explanation;Demonstration;Verbal cues;Handout;Tactile cues    Comprehension  Returned demonstration;Verbalized understanding       OT Short Term Goals - 02/28/18 1105      OT SHORT TERM GOAL #1   Title  Patient will be educated and independent with HEP to increase functional use and mobility of LUE as needed for daily tasks.     Time  3    Period  Weeks    Status  New    Target  Date  03/21/18      OT SHORT TERM GOAL #2   Title  Patient will report a decrease in pain level to approximately 4/10 when completing simple daily tasks using his LUE.     Time  3    Period  Weeks    Status  New      OT SHORT TERM GOAL #3   Title  Patient will increase his LUE shoulder and scapular strength to 4/5 overall while presenting in increase posture and increase ability to complete lightweighting lifting tasks.     Time  3    Period  Weeks    Status  New      OT SHORT TERM GOAL #4   Title  Patient will increase his LUE A/ROM to [facility] to increase ability to complete all dressing tasks such  as buckling his belt and pants with less difficulty.     Time  3    Period  Weeks    Status  New               Plan - 02/28/18 1101    Clinical Impression Statement  A: Patient is a 77 y/o male S/P left shoulder pain causing decreased strength and ROM resulting in difficulty completing daily tasks using his LUE as non-dominant.     Occupational Profile and client history currently impacting functional performance  patient is motivated to return to prior level of function.     Occupational performance deficits (Please refer to evaluation for details):  ADL's;IADL's;Rest and Sleep;Leisure    Rehab Potential  Good    Current Impairments/barriers affecting progress:  possible chronic tear    OT Frequency  3x / week    OT Duration  Other (comment)   3 weeks   OT Treatment/Interventions  Self-care/ADL training;Moist Heat;DME and/or AE instruction;Therapeutic activities;Ultrasound;Therapeutic exercise;Passive range of motion;Cryotherapy;Electrical Stimulation;Manual Therapy;Patient/family education    Plan  P: patient will benefit from skilled OT services to increase functional performance during daily tasks while using his left UE as non-dominant. Treatment Plan: Complete passive stretching, A/ROM supine initially, complete A/ROM seated or AA/ROM if unable to gain full range. Complete  loading of shoulder joint within pain free zone. modalities as needed. Complete general scapular and shoulder strengthening as able.     Clinical Decision Making  Limited treatment options, no task modification necessary    Consulted and Agree with Plan of Care  Patient       Patient will benefit from skilled therapeutic intervention in order to improve the following deficits and impairments:  Decreased range of motion, Pain, Impaired UE functional use, Decreased strength, Decreased mobility  Visit Diagnosis: Other symptoms and signs involving the musculoskeletal system - Plan: Ot plan of care cert/re-cert, CANCELED: Ot plan of care cert/re-cert  Acute pain of left shoulder - Plan: Ot plan of care cert/re-cert, CANCELED: Ot plan of care cert/re-cert  Stiffness of left shoulder, not elsewhere classified - Plan: Ot plan of care cert/re-cert, CANCELED: Ot plan of care cert/re-cert    Problem List Patient Active Problem List   Diagnosis Date Noted  . Dementia, neurological 02/14/2017  . Encounter for antineoplastic immunotherapy 01/02/2017  . Goals of care, counseling/discussion 01/02/2017  . Cerebrovascular accident (CVA) due to thrombosis of right carotid artery ([place]) 12/20/2016  . Cerebrovascular accident (CVA) due to embolism of cerebral artery ([place]) 12/20/2016  . Memory loss 12/20/2016  . Dehydration 11/15/2016  . Encounter for antineoplastic chemotherapy 10/17/2016  . Malnutrition of moderate degree 10/06/2016  . Urinary tract infection 10/03/2016  . T1b N2 M0 disease (stage IIIA).adenocarcinoma of the right middle lobe of lung ([place]) 09/20/2016  . Peripheral edema   . Slow transit constipation   . Acute lower UTI   . Confusion   . SOB (shortness of breath) 09/02/2016  . Urinary incontinence   . Abnormal urinalysis   . Leukocytosis   . HCAP (healthcare-associated pneumonia)   . Hypoalbuminemia due to protein-calorie malnutrition ([place])   . Hypokalemia   . Hyponatremia   .  Urinary retention   . Sleep disturbance   . Acute bilateral deep vein thrombosis (DVT) of popliteal veins (H[facility])   . Brain mass 08/31/2016  . Debility   . Benign essential HTN   . Scrotal edema   . Hyperlipidemia   . Constipation due to pain medication   .  Fall from height of greater than 3 feet   . Acute blood loss anemia   . Lung mass   . Post-operative pain   . PVD (peripheral vascular disease) ([place])   . Coronary artery disease involving coronary bypass graft of native heart without angina pectoris   . PAF (paroxysmal atrial fibrillation) ([place])   . Disorder of left sacroiliac joint 08/24/2016  . Fall 08/22/2016  . Closed pelvic ring fracture ([place])   . Carotid artery stenosis, asymptomatic 08/12/2014  . Ejection murmur 07/03/2014  . Aftercare following surgery of the circulatory system, [place] 02/11/2014  . Weakness-Abdomin and Bilateral Thigh / Leg 02/11/2014  . Carotid stenosis 07/19/2013  . Occlusion and stenosis of carotid artery without mention of cerebral infarction 07/16/2013  . Impaired fasting glucose 07/14/2013  . Knee pain 05/23/2011  . Stiffness of joint, not elsewhere classified, lower leg 05/23/2011  . Muscle weakness (generalized) 05/23/2011  . Difficulty in walking(719.7) 05/23/2011  . Bruit 11/19/2010  . AAA (abdominal aortic aneurysm) ([place]) 11/19/2010  . OTHER DYSPHAGIA 04/27/2010  . ATRIAL FIBRILLATION 12/10/2008  . JOINT EFFUSION, KNEE 01/14/2008  . KNEE PAIN 01/14/2008  . KNEE, ARTHRITIS, DEGEN./OSTEO 12/11/2007  . Hypothyroidism 10/10/2007  . Pure hypercholesterolemia 10/10/2007  . Essential hypertension 10/10/2007  . Coronary atherosclerosis 10/10/2007  . Peripheral vascular disease ([place]) 10/10/2007  . CHRONIC OBSTRUCTIVE PULMONARY DISEASE 10/10/2007  . GERD 10/10/2007  . DIVERTICULOSIS OF COLON 10/10/2007  . OSTEOPOROSIS 10/10/2007  . NEPHROLITHIASIS, HX OF 10/10/2007  . Personal history of surgery to heart and great vessels, presenting hazards to  health 10/10/2007  . CHOLECYSTECTOMY, HX OF 10/10/2007  . CORONARY ARTERY BYPASS GRAFT, HX OF 10/10/2007   Ailene Ravel, OTR/L,CBIS  [phone]  02/28/2018, 11:16 AM  [facility] [address] [place], [place], [address] Phone: [phone]   Fax:  [phone]  Name: [name] [name] MRN: [id number] Date of Birth: Feb 21, 1941

## 2018-03-01 ENCOUNTER — Ambulatory Visit (INDEPENDENT_AMBULATORY_CARE_PROVIDER_SITE_OTHER): Payer: Medicare Other | Admitting: *Deleted

## 2018-03-01 DIAGNOSIS — Z7901 Long term (current) use of anticoagulants: Secondary | ICD-10-CM | POA: Diagnosis not present

## 2018-03-01 LAB — POCT INR: INR: 2.1 (ref 2.0–3.0)

## 2018-03-01 NOTE — Patient Instructions (Signed)
coumadin 5mg  tablets. Take one and a half tablets on Sunday, Tuesday and Friday and one tablet Monday, Wednesday, Thursday and Saturday. Recheck INR in 4 weeks

## 2018-03-02 ENCOUNTER — Encounter (HOSPITAL_COMMUNITY): Payer: Self-pay

## 2018-03-02 ENCOUNTER — Ambulatory Visit (HOSPITAL_COMMUNITY): Payer: Medicare Other

## 2018-03-02 DIAGNOSIS — R29898 Other symptoms and signs involving the musculoskeletal system: Secondary | ICD-10-CM | POA: Diagnosis not present

## 2018-03-02 DIAGNOSIS — M25512 Pain in left shoulder: Secondary | ICD-10-CM

## 2018-03-02 DIAGNOSIS — M25612 Stiffness of left shoulder, not elsewhere classified: Secondary | ICD-10-CM

## 2018-03-02 NOTE — Therapy (Signed)
[place], [place], [address] Phone: [phone]   Fax:  [phone]  Occupational Therapy Treatment  Patient Details  Name: [name] [name] MRN: [id number] Date of Birth: 1940/06/27 Referring Provider: Dr. Arther Abbott   Encounter Date: 03/02/2018  OT End of Session - 03/02/18 1002    Visit Number  2    Number of Visits  9    Date for OT Re-Evaluation  03/21/18    Authorization Type  1) Medicare 2) BSBC    OT Start Time  0903    OT Stop Time  0945    OT Time Calculation (min)  42 min    Activity Tolerance  Patient tolerated treatment well    Behavior During Therapy  [facility] for tasks assessed/performed       Past Medical History:  Diagnosis Date  . Adenocarcinoma of right lung, stage 3 ([place]) 09/20/2016  . Atrial fibrillation ([place])    no hx of reported at preop visit of 04/21/11   . CAD (coronary artery disease)   . Carotid artery occlusion    left carotid endarterectomy  . Chronic kidney disease    AAA repair with reimplant of renals   . CHRONIC OBSTRUCTIVE PULMONARY DISEASE    pt denied at visit of 04/21/11   . CORONARY ARTERY DISEASE   . Dehydration 11/15/2016  . Disorder of left sacroiliac joint 08/24/2016  . DIVERTICULOSIS OF COLON   . Encounter for antineoplastic chemotherapy 10/17/2016  . GERD   . H/O hiatal hernia   . Headache(784.0)    Hx: of years of years  . HYPERTENSION   . HYPOTHYROIDISM   . JOINT EFFUSION, KNEE   . KNEE, ARTHRITIS, DEGEN./OSTEO    right knee   . Myocardial infarction ([place])    1994   . NEPHROLITHIASIS, HX OF   . OSTEOPOROSIS   . Other dysphagia   . PERIPHERAL VASCULAR DISEASE    AAA - 1994 with reimplant of renals   . Peripheral vascular disease ([place])    subclavian stenosis PTA - 3/08   . Pure hypercholesterolemia   . Renal artery stenosis [facility])     Past Surgical History:  Procedure Laterality Date  . ABDOMINAL AORTIC ANEURYSM REPAIR     with reimplantation of  renals   . CARDIAC CATHETERIZATION     2008  . CAROTID ENDARTERECTOMY Left Jan. 30, 2015   CEA  . CHOLECYSTECTOMY  2000   Gall Bladder  . COLONOSCOPY W/ BIOPSIES AND POLYPECTOMY     Hx: of  . CORONARY ARTERY BYPASS GRAFT     1995  . CORONARY STENT PLACEMENT     Hx: of  . ENDARTERECTOMY Left 07/19/2013   Procedure: ENDARTERECTOMY CAROTID;  Surgeon: Mal Misty, MD;  Location: [place];  Service: Vascular;  Laterality: Left;  . GALLBLADDER SURGERY  2004   . JOINT REPLACEMENT     partial knee replacement on left 2002   . KNEE SURGERY    . ORIF PELVIC FRACTURE Left 08/25/2016   Procedure: OPEN REDUCTION INTERNAL FIXATION (ORIF) PELVIC FRACTURE; plate on front, SI screw on the back;  Surgeon: Altamese Manning, MD;  Location: [place];  Service: Orthopedics;  Laterality: Left;  . OTHER SURGICAL HISTORY     left subclavian stenosis surgery PTA 08/2006   . OTHER SURGICAL HISTORY     carotid surgery on right 2004   . TOTAL KNEE ARTHROPLASTY  04/29/2011   Procedure: TOTAL KNEE ARTHROPLASTY;  Surgeon: Dione Plover [name];  [facility];  Service: Orthopedics;  Laterality: Right;  . urinary retention    . VASCULAR SURGERY     AAA  . VASECTOMY  1973  . VIDEO BRONCHOSCOPY WITH ENDOBRONCHIAL ULTRASOUND [name] 09/09/2016   Procedure: VIDEO BRONCHOSCOPY WITH ENDOBRONCHIAL ULTRASOUND;  Surgeon: Grace Isaac, MD;  Location: [place];  Service: Thoracic;  Laterality: [name];    There were no vitals filed for this visit.  Subjective Assessment - 03/02/18 0937    Subjective   S: i made new holes with my knife on my belt to see if that would help with the pain.     Special Tests  FOTO: 60/100    Currently in Pain?  No/denies   only with moving a certain way        [facility] OT Assessment - 03/02/18 0939      Assessment   Medical Diagnosis  Left shoulder pain      Precautions   Precautions  None      Observation/Other Assessments   Focus on Therapeutic Outcomes (FOTO)   60/100               OT  Treatments/Exercises (OP) - 03/02/18 0939      Exercises   Exercises  Shoulder      Shoulder Exercises: Supine   Protraction  PROM;5 reps;AROM;10 reps    Horizontal ABduction  PROM;5 reps;AROM;10 reps    External Rotation  PROM;5 reps;AROM;10 reps    Internal Rotation  PROM;5 reps;AROM;10 reps    Flexion  PROM;5 reps;AROM;10 reps    ABduction  PROM;5 reps;AROM;10 reps    ABduction Limitations  only to shoulder level due to form      Shoulder Exercises: Seated   Protraction  AROM;10 reps    Horizontal ABduction  AROM;10 reps    External Rotation  AROM;10 reps    Internal Rotation  AROM;10 reps    Flexion  AROM;10 reps    Abduction  AROM;10 reps      Shoulder Exercises: ROM/Strengthening   Wall Wash  1'             OT Education - 03/02/18 1002    Education Details  Patient provided with OT evaluation print out for goals and plan of care reference.     Person(s) Educated  Patient    Methods  Explanation;Handout    Comprehension  Verbalized understanding       OT Short Term Goals - 03/02/18 1005      OT SHORT TERM GOAL #1   Title  Patient will be educated and independent with HEP to increase functional use and mobility of LUE as needed for daily tasks.     Time  3    Period  Weeks    Status  On-going      OT SHORT TERM GOAL #2   Title  Patient will report a decrease in pain level to approximately 4/10 when completing simple daily tasks using his LUE.     Time  3    Period  Weeks    Status  On-going      OT SHORT TERM GOAL #3   Title  Patient will increase his LUE shoulder and scapular strength to 4/5 overall while presenting in increase posture and increase ability to complete lightweighting lifting tasks.     Time  3    Period  Weeks    Status  On-going      OT  SHORT TERM GOAL #4   Title  Patient will increase his LUE A/ROM to [facility] to increase ability to complete all dressing tasks such as buckling his belt and pants with less difficulty.     Time  3     Period  Weeks    Status  On-going               Plan - 03/02/18 1003    Clinical Impression Statement  A: Patient required moderate to max VC for form and technique. patient focusing a lot on being able to buckle his belt without pain. Patient had a lot of questions regarding his shoulder and what to do for it. All education and questions addressed during session. Pt was able to complete A/ROM supine and seated with modifications made for abduction due to pain and decreased form.     Plan  P: Continue with A/ROM and add to HEP. Add proximal shoulder strengthening     Consulted and Agree with Plan of Care  Patient       Patient will benefit from skilled therapeutic intervention in order to improve the following deficits and impairments:  Decreased range of motion, Pain, Impaired UE functional use, Decreased strength, Decreased mobility  Visit Diagnosis: Other symptoms and signs involving the musculoskeletal system  Acute pain of left shoulder  Stiffness of left shoulder, not elsewhere classified    Problem List Patient Active Problem List   Diagnosis Date Noted  . Dementia, neurological 02/14/2017  . Encounter for antineoplastic immunotherapy 01/02/2017  . Goals of care, counseling/discussion 01/02/2017  . Cerebrovascular accident (CVA) due to thrombosis of right carotid artery ([place]) 12/20/2016  . Cerebrovascular accident (CVA) due to embolism of cerebral artery ([place]) 12/20/2016  . Memory loss 12/20/2016  . Dehydration 11/15/2016  . Encounter for antineoplastic chemotherapy 10/17/2016  . Malnutrition of moderate degree 10/06/2016  . Urinary tract infection 10/03/2016  . T1b N2 M0 disease (stage IIIA).adenocarcinoma of the right middle lobe of lung ([place]) 09/20/2016  . Peripheral edema   . Slow transit constipation   . Acute lower UTI   . Confusion   . SOB (shortness of breath) 09/02/2016  . Urinary incontinence   . Abnormal urinalysis   . Leukocytosis   . HCAP  (healthcare-associated pneumonia)   . Hypoalbuminemia due to protein-calorie malnutrition ([place])   . Hypokalemia   . Hyponatremia   . Urinary retention   . Sleep disturbance   . Acute bilateral deep vein thrombosis (DVT) of popliteal veins (H[facility])   . Brain mass 08/31/2016  . Debility   . Benign essential HTN   . Scrotal edema   . Hyperlipidemia   . Constipation due to pain medication   . Fall from height of greater than 3 feet   . Acute blood loss anemia   . Lung mass   . Post-operative pain   . PVD (peripheral vascular disease) ([place])   . Coronary artery disease involving coronary bypass graft of native heart without angina pectoris   . PAF (paroxysmal atrial fibrillation) ([place])   . Disorder of left sacroiliac joint 08/24/2016  . Fall 08/22/2016  . Closed pelvic ring fracture ([place])   . Carotid artery stenosis, asymptomatic 08/12/2014  . Ejection murmur 07/03/2014  . Aftercare following surgery of the circulatory system, [place] 02/11/2014  . Weakness-Abdomin and Bilateral Thigh / Leg 02/11/2014  . Carotid stenosis 07/19/2013  . Occlusion and stenosis of carotid artery without mention of cerebral infarction 07/16/2013  . Impaired fasting glucose  07/14/2013  . Knee pain 05/23/2011  . Stiffness of joint, not elsewhere classified, lower leg 05/23/2011  . Muscle weakness (generalized) 05/23/2011  . Difficulty in walking(719.7) 05/23/2011  . Bruit 11/19/2010  . AAA (abdominal aortic aneurysm) ([place]) 11/19/2010  . OTHER DYSPHAGIA 04/27/2010  . ATRIAL FIBRILLATION 12/10/2008  . JOINT EFFUSION, KNEE 01/14/2008  . KNEE PAIN 01/14/2008  . KNEE, ARTHRITIS, DEGEN./OSTEO 12/11/2007  . Hypothyroidism 10/10/2007  . Pure hypercholesterolemia 10/10/2007  . Essential hypertension 10/10/2007  . Coronary atherosclerosis 10/10/2007  . Peripheral vascular disease ([place]) 10/10/2007  . CHRONIC OBSTRUCTIVE PULMONARY DISEASE 10/10/2007  . GERD 10/10/2007  . DIVERTICULOSIS OF COLON 10/10/2007  .  OSTEOPOROSIS 10/10/2007  . NEPHROLITHIASIS, HX OF 10/10/2007  . Personal history of surgery to heart and great vessels, presenting hazards to health 10/10/2007  . CHOLECYSTECTOMY, HX OF 10/10/2007  . CORONARY ARTERY BYPASS GRAFT, HX OF 10/10/2007   Ailene Ravel, OTR/L,CBIS  [phone]  03/02/2018, 10:07 AM  [place] [address] [place], [place], [address] Phone: [phone]   Fax:  [phone]  Name: [name] [name] MRN: [id number] Date of Birth: Mar 10, 1941

## 2018-03-05 ENCOUNTER — Ambulatory Visit (HOSPITAL_COMMUNITY): Payer: Medicare Other | Admitting: Specialist

## 2018-03-05 DIAGNOSIS — R29898 Other symptoms and signs involving the musculoskeletal system: Secondary | ICD-10-CM

## 2018-03-05 DIAGNOSIS — M25612 Stiffness of left shoulder, not elsewhere classified: Secondary | ICD-10-CM

## 2018-03-05 DIAGNOSIS — M25512 Pain in left shoulder: Secondary | ICD-10-CM | POA: Diagnosis not present

## 2018-03-05 NOTE — Therapy (Signed)
[place], [place], [address] Phone: [phone]   Fax:  [phone]  Occupational Therapy Treatment  Patient Details  Name: [name] [name] MRN: [id number] Date of Birth: [birth year] Referring Provider: Dr. Arther Abbott   Encounter Date: 03/05/2018  OT End of Session - 03/05/18 1335    Visit Number  3    Number of Visits  9    Date for OT Re-Evaluation  03/21/18    Authorization Type  1) Medicare 2) BSBC    OT Start Time  0905    OT Stop Time  0945    OT Time Calculation (min)  40 min    Activity Tolerance  Patient tolerated treatment well    Behavior During Therapy  [facility] for tasks assessed/performed       Past Medical History:  Diagnosis Date  . Adenocarcinoma of right lung, stage 3 ([place]) 09/20/2016  . Atrial fibrillation ([place])    no hx of reported at preop visit of 04/21/11   . CAD (coronary artery disease)   . Carotid artery occlusion    left carotid endarterectomy  . Chronic kidney disease    AAA repair with reimplant of renals   . CHRONIC OBSTRUCTIVE PULMONARY DISEASE    pt denied at visit of 04/21/11   . CORONARY ARTERY DISEASE   . Dehydration 11/15/2016  . Disorder of left sacroiliac joint 08/24/2016  . DIVERTICULOSIS OF COLON   . Encounter for antineoplastic chemotherapy 10/17/2016  . GERD   . H/O hiatal hernia   . Headache(784.0)    Hx: of years of years  . HYPERTENSION   . HYPOTHYROIDISM   . JOINT EFFUSION, KNEE   . KNEE, ARTHRITIS, DEGEN./OSTEO    right knee   . Myocardial infarction ([place])    1994   . NEPHROLITHIASIS, HX OF   . OSTEOPOROSIS   . Other dysphagia   . PERIPHERAL VASCULAR DISEASE    AAA - 1994 with reimplant of renals   . Peripheral vascular disease ([place])    subclavian stenosis PTA - 3/08   . Pure hypercholesterolemia   . Renal artery stenosis [facility])     Past Surgical History:  Procedure Laterality Date  . ABDOMINAL AORTIC ANEURYSM REPAIR     with reimplantation of  renals   . CARDIAC CATHETERIZATION     2008  . CAROTID ENDARTERECTOMY Left Jan. 30, 2015   CEA  . CHOLECYSTECTOMY  2000   Gall Bladder  . COLONOSCOPY W/ BIOPSIES AND POLYPECTOMY     Hx: of  . CORONARY ARTERY BYPASS GRAFT     1995  . CORONARY STENT PLACEMENT     Hx: of  . ENDARTERECTOMY Left 07/19/2013   Procedure: ENDARTERECTOMY CAROTID;  Surgeon: Mal Misty, MD;  Location: [place];  Service: Vascular;  Laterality: Left;  . GALLBLADDER SURGERY  2004   . JOINT REPLACEMENT     partial knee replacement on left 2002   . KNEE SURGERY    . ORIF PELVIC FRACTURE Left 08/25/2016   Procedure: OPEN REDUCTION INTERNAL FIXATION (ORIF) PELVIC FRACTURE; plate on front, SI screw on the back;  Surgeon: Altamese [place], MD;  Location: [place];  Service: Orthopedics;  Laterality: Left;  . OTHER SURGICAL HISTORY     left subclavian stenosis surgery PTA 08/2006   . OTHER SURGICAL HISTORY     carotid surgery on right 2004   . TOTAL KNEE ARTHROPLASTY  04/29/2011   Procedure: TOTAL KNEE ARTHROPLASTY;  Surgeon: Dione Plover [name];  [facility];  Service: Orthopedics;  Laterality: Right;  . urinary retention    . VASCULAR SURGERY     AAA  . VASECTOMY  1973  . VIDEO BRONCHOSCOPY WITH ENDOBRONCHIAL ULTRASOUND [name] 09/09/2016   Procedure: VIDEO BRONCHOSCOPY WITH ENDOBRONCHIAL ULTRASOUND;  Surgeon: Grace Isaac, MD;  Location: [place];  Service: Thoracic;  Laterality: [name];    There were no vitals filed for this visit.      [facility] OT Assessment - 03/05/18 0001      Assessment   Medical Diagnosis  Left shoulder pain      Precautions   Precautions  None               OT Treatments/Exercises (OP) - 03/05/18 0001      Exercises   Exercises  Shoulder      Shoulder Exercises: Supine   Protraction  PROM;5 reps;AROM;12 reps    Horizontal ABduction  PROM;5 reps;AROM;12 reps    External Rotation  PROM;5 reps;AROM;12 reps    External Rotation Limitations  OT positioned arm in neutral at side, vs  in slight extension due to pain level    Internal Rotation  PROM;5 reps;AROM;12 reps    Flexion  PROM;5 reps;AROM;12 reps    ABduction  PROM;5 reps;AROM;12 reps    ABduction Limitations  with elbow flexed to 90, abd to 90       Shoulder Exercises: Seated   Elevation  AROM;10 reps    Extension  AROM;10 reps    Retraction  AROM;10 reps    Protraction  AROM;12 reps    Horizontal ABduction  AROM;12 reps    External Rotation  AROM;12 reps    Internal Rotation  AROM;12 reps    Flexion  AROM;12 reps    Abduction  AROM;12 reps      Shoulder Exercises: Therapy Ball   Flexion  15 reps    ABduction  15 reps      Shoulder Exercises: ROM/Strengthening   Wall Wash  1'    Proximal Shoulder Strengthening, Supine  10 times max verbal guidance for technique       Manual Therapy   Manual Therapy  Myofascial release    Manual therapy comments  manual therapy interventions completed seperately from all other treatment this date of service.    Myofascial Release  myofascial release and manual stretching to left uper arm, scapular, and shoulder region to decrease pain and restrictions and improve pain free mobilty  .               OT Short Term Goals - 03/02/18 1005      OT SHORT TERM GOAL #1   Title  Patient will be educated and independent with HEP to increase functional use and mobility of LUE as needed for daily tasks.     Time  3    Period  Weeks    Status  On-going      OT SHORT TERM GOAL #2   Title  Patient will report a decrease in pain level to approximately 4/10 when completing simple daily tasks using his LUE.     Time  3    Period  Weeks    Status  On-going      OT SHORT TERM GOAL #3   Title  Patient will increase his LUE shoulder and scapular strength to 4/5 overall while presenting in increase posture and increase ability to complete lightweighting lifting tasks.  Time  3    Period  Weeks    Status  On-going      OT SHORT TERM GOAL #4   Title  Patient will  increase his LUE A/ROM to [facility] to increase ability to complete all dressing tasks such as buckling his belt and pants with less difficulty.     Time  3    Period  Weeks    Status  On-going               Plan - 03/05/18 1335    Clinical Impression Statement  A:  Patient able to complete a/rom in supine and seated with moderate pain associated with external rotation and abduction in supine.  patient with kyphotic posture and would benefit from addition of postural exercises to improve shoulder alignment.     Plan  P:  Add postural exercises for improved shoulder alignment, add proximal shoulder strengthening in seated, standing theraband exercises and ball circles seated with large therapy ball to promote improved shoulder rotation needed for ADL completion.     Consulted and Agree with Plan of Care  Patient       Patient will benefit from skilled therapeutic intervention in order to improve the following deficits and impairments:  Decreased range of motion, Pain, Impaired UE functional use, Decreased strength, Decreased mobility  Visit Diagnosis: Other symptoms and signs involving the musculoskeletal system  Acute pain of left shoulder  Stiffness of left shoulder, not elsewhere classified    Problem List Patient Active Problem List   Diagnosis Date Noted  . Dementia, neurological 02/14/2017  . Encounter for antineoplastic immunotherapy 01/02/2017  . Goals of care, counseling/discussion 01/02/2017  . Cerebrovascular accident (CVA) due to thrombosis of right carotid artery ([place]) 12/20/2016  . Cerebrovascular accident (CVA) due to embolism of cerebral artery ([place]) 12/20/2016  . Memory loss 12/20/2016  . Dehydration 11/15/2016  . Encounter for antineoplastic chemotherapy 10/17/2016  . Malnutrition of moderate degree 10/06/2016  . Urinary tract infection 10/03/2016  . T1b N2 M0 disease (stage IIIA).adenocarcinoma of the right middle lobe of lung ([place]) 09/20/2016  . Peripheral  edema   . Slow transit constipation   . Acute lower UTI   . Confusion   . SOB (shortness of breath) 09/02/2016  . Urinary incontinence   . Abnormal urinalysis   . Leukocytosis   . HCAP (healthcare-associated pneumonia)   . Hypoalbuminemia due to protein-calorie malnutrition ([place])   . Hypokalemia   . Hyponatremia   . Urinary retention   . Sleep disturbance   . Acute bilateral deep vein thrombosis (DVT) of popliteal veins ([facility])   . Brain mass 08/31/2016  . Debility   . Benign essential HTN   . Scrotal edema   . Hyperlipidemia   . Constipation due to pain medication   . Fall from height of greater than 3 feet   . Acute blood loss anemia   . Lung mass   . Post-operative pain   . PVD (peripheral vascular disease) ([place])   . Coronary artery disease involving coronary bypass graft of native heart without angina pectoris   . PAF (paroxysmal atrial fibrillation) ([place])   . Disorder of left sacroiliac joint 08/24/2016  . Fall 08/22/2016  . Closed pelvic ring fracture ([place])   . Carotid artery stenosis, asymptomatic 08/12/2014  . Ejection murmur 07/03/2014  . Aftercare following surgery of the circulatory system, [place] 02/11/2014  . Weakness-Abdomin and Bilateral Thigh / Leg 02/11/2014  . Carotid stenosis 07/19/2013  .  Occlusion and stenosis of carotid artery without mention of cerebral infarction 07/16/2013  . Impaired fasting glucose 07/14/2013  . Knee pain 05/23/2011  . Stiffness of joint, not elsewhere classified, lower leg 05/23/2011  . Muscle weakness (generalized) 05/23/2011  . Difficulty in walking(719.7) 05/23/2011  . Bruit 11/19/2010  . AAA (abdominal aortic aneurysm) ([place]) 11/19/2010  . OTHER DYSPHAGIA 04/27/2010  . ATRIAL FIBRILLATION 12/10/2008  . JOINT EFFUSION, KNEE 01/14/2008  . KNEE PAIN 01/14/2008  . KNEE, ARTHRITIS, DEGEN./OSTEO 12/11/2007  . Hypothyroidism 10/10/2007  . Pure hypercholesterolemia 10/10/2007  . Essential hypertension 10/10/2007  . Coronary  atherosclerosis 10/10/2007  . Peripheral vascular disease ([place]) 10/10/2007  . CHRONIC OBSTRUCTIVE PULMONARY DISEASE 10/10/2007  . GERD 10/10/2007  . DIVERTICULOSIS OF COLON 10/10/2007  . OSTEOPOROSIS 10/10/2007  . NEPHROLITHIASIS, HX OF 10/10/2007  . Personal history of surgery to heart and great vessels, presenting hazards to health 10/10/2007  . CHOLECYSTECTOMY, HX OF 10/10/2007  . CORONARY ARTERY BYPASS GRAFT, HX OF 10/10/2007    Vangie Bicker, [place], OTR/L [phone]  03/05/2018, 1:43 PM  [place], [place], [address] Phone: [phone]   Fax:  [phone]  Name: [name] [name] MRN: [id number] Date of Birth: 1941-03-04

## 2018-03-07 ENCOUNTER — Encounter (HOSPITAL_COMMUNITY): Payer: Self-pay

## 2018-03-07 ENCOUNTER — Ambulatory Visit (HOSPITAL_COMMUNITY): Payer: Medicare Other

## 2018-03-07 DIAGNOSIS — R29898 Other symptoms and signs involving the musculoskeletal system: Secondary | ICD-10-CM

## 2018-03-07 DIAGNOSIS — M25512 Pain in left shoulder: Secondary | ICD-10-CM

## 2018-03-07 DIAGNOSIS — M25612 Stiffness of left shoulder, not elsewhere classified: Secondary | ICD-10-CM | POA: Diagnosis not present

## 2018-03-07 NOTE — Therapy (Addendum)
[place], [place], [address] Phone: [phone]   Fax:  [phone]  Occupational Therapy Treatment  Patient Details  Name: [name] [name] MRN: [id number] Date of Birth: December 14, 1940 Referring Provider: Dr. Arther Abbott   Encounter Date: 03/07/2018  OT End of Session - 03/07/18 1238    Visit Number  4    Number of Visits  9    Date for OT Re-Evaluation  03/21/18    Authorization Type  1) Medicare 2) BSBC    OT Start Time  1029    OT Stop Time  1110    OT Time Calculation (min)  41 min    Activity Tolerance  Patient tolerated treatment well    Behavior During Therapy  [facility] for tasks assessed/performed       Past Medical History:  Diagnosis Date  . Adenocarcinoma of right lung, stage 3 ([place]) 09/20/2016  . Atrial fibrillation ([place])    no hx of reported at preop visit of 04/21/11   . CAD (coronary artery disease)   . Carotid artery occlusion    left carotid endarterectomy  . Chronic kidney disease    AAA repair with reimplant of renals   . CHRONIC OBSTRUCTIVE PULMONARY DISEASE    pt denied at visit of 04/21/11   . CORONARY ARTERY DISEASE   . Dehydration 11/15/2016  . Disorder of left sacroiliac joint 08/24/2016  . DIVERTICULOSIS OF COLON   . Encounter for antineoplastic chemotherapy 10/17/2016  . GERD   . H/O hiatal hernia   . Headache(784.0)    Hx: of years of years  . HYPERTENSION   . HYPOTHYROIDISM   . JOINT EFFUSION, KNEE   . KNEE, ARTHRITIS, DEGEN./OSTEO    right knee   . Myocardial infarction ([place])    1994   . NEPHROLITHIASIS, HX OF   . OSTEOPOROSIS   . Other dysphagia   . PERIPHERAL VASCULAR DISEASE    AAA - 1994 with reimplant of renals   . Peripheral vascular disease ([place])    subclavian stenosis PTA - 3/08   . Pure hypercholesterolemia   . Renal artery stenosis [facility])     Past Surgical History:  Procedure Laterality Date  . ABDOMINAL AORTIC ANEURYSM REPAIR     with reimplantation of  renals   . CARDIAC CATHETERIZATION     2008  . CAROTID ENDARTERECTOMY Left Jan. 30, 2015   CEA  . CHOLECYSTECTOMY  2000   Gall Bladder  . COLONOSCOPY W/ BIOPSIES AND POLYPECTOMY     Hx: of  . CORONARY ARTERY BYPASS GRAFT     1995  . CORONARY STENT PLACEMENT     Hx: of  . ENDARTERECTOMY Left 07/19/2013   Procedure: ENDARTERECTOMY CAROTID;  Surgeon: Mal Misty, MD;  Location: [place];  Service: Vascular;  Laterality: Left;  . GALLBLADDER SURGERY  2004   . JOINT REPLACEMENT     partial knee replacement on left 2002   . KNEE SURGERY    . ORIF PELVIC FRACTURE Left 08/25/2016   Procedure: OPEN REDUCTION INTERNAL FIXATION (ORIF) PELVIC FRACTURE; plate on front, SI screw on the back;  Surgeon: Altamese [place], MD;  Location: [place];  Service: Orthopedics;  Laterality: Left;  . OTHER SURGICAL HISTORY     left subclavian stenosis surgery PTA 08/2006   . OTHER SURGICAL HISTORY     carotid surgery on right 2004   . TOTAL KNEE ARTHROPLASTY  04/29/2011   Procedure: TOTAL KNEE ARTHROPLASTY;  Surgeon: Dione Plover [name];  [facility];  Service: Orthopedics;  Laterality: Right;  . urinary retention    . VASCULAR SURGERY     AAA  . VASECTOMY  1973  . VIDEO BRONCHOSCOPY WITH ENDOBRONCHIAL ULTRASOUND [name] 09/09/2016   Procedure: VIDEO BRONCHOSCOPY WITH ENDOBRONCHIAL ULTRASOUND;  Surgeon: Grace Isaac, MD;  Location: [place];  Service: Thoracic;  Laterality: [name];    There were no vitals filed for this visit.  Subjective Assessment - 03/07/18 1028    Subjective   S: Feels like someone hit you in the shoulder with a hammer.     Currently in Pain?  Yes    Pain Score  3     Pain Location  Shoulder    Pain Orientation  Left    Pain Descriptors / Indicators  Shooting    Pain Type  Acute pain    Pain Radiating Towards  [name]    Pain Onset  More than a month ago    Pain Frequency  Constant    Aggravating Factors   Movement, Use of arm    Pain Relieving Factors  None    Effect of Pain on Daily  Activities  Mod effect on daily activities    Multiple Pain Sites  No         [facility] OT Assessment - 03/07/18 1027      Assessment   Medical Diagnosis  Left shoulder pain      Precautions   Precautions  None               OT Treatments/Exercises (OP) - 03/07/18 1027      Exercises   Exercises  Shoulder      Shoulder Exercises: Supine   Protraction  PROM;5 reps;AROM;15 reps    Horizontal ABduction  PROM;5 reps;AROM;15 reps    External Rotation  PROM;5 reps;AROM;15 reps    External Rotation Limitations  OT positioned arm in neutral at side, vs in slight extension due to pain level    Internal Rotation  PROM;5 reps;AROM;15 reps    Flexion  PROM;5 reps;AROM;15 reps    ABduction  PROM;5 reps;AROM;15 reps    ABduction Limitations  with elbow flexed to 90, abducted to 90       Shoulder Exercises: Seated   Elevation  AROM;10 reps    Extension  AROM;10 reps    Retraction  AROM;10 reps    Row  AROM;10 reps      Shoulder Exercises: Standing   Extension  Both;10 reps;Theraband    Theraband Level (Shoulder Extension)  Level 2 (Red)    Row  Both;10 reps;Theraband    Theraband Level (Shoulder Row)  Level 2 (Red)    Retraction  Both;10 reps;Theraband    Theraband Level (Shoulder Retraction)  Level 2 (Red)      Shoulder Exercises: Therapy Ball   Flexion  15 reps    ABduction  15 reps      Shoulder Exercises: ROM/Strengthening   Wall Wash  1'    Proximal Shoulder Strengthening, Supine  10X with max verbal and tactile guidance for proper form    Other ROM/Strengthening Exercises  PVC pipe slide               OT Short Term Goals - 03/02/18 1005      OT SHORT TERM GOAL #1   Title  Patient will be educated and independent with HEP to increase functional use and mobility of LUE as needed for daily tasks.  Time  3    Period  Weeks    Status  On-going      OT SHORT TERM GOAL #2   Title  Patient will report a decrease in pain level to approximately 4/10 when  completing simple daily tasks using his LUE.     Time  3    Period  Weeks    Status  On-going      OT SHORT TERM GOAL #3   Title  Patient will increase his LUE shoulder and scapular strength to 4/5 overall while presenting in increase posture and increase ability to complete lightweighting lifting tasks.     Time  3    Period  Weeks    Status  On-going      OT SHORT TERM GOAL #4   Title  Patient will increase his LUE A/ROM to [facility] to increase ability to complete all dressing tasks such as buckling his belt and pants with less difficulty.     Time  3    Period  Weeks    Status  On-going               Plan - 03/07/18 1238    Clinical Impression Statement  A:  Pt was able to complete A/ROM in supine and seated with mod pain noted during external rotation and abduction. Pt was able to perform postural exercises to improve shoulder alignment with significant cues and he required mod-max verbal and tactile cues for form and technique throughout session.     Plan  P:  Continue with postural exercises for improved shoulder alignment. Add additional theraband exercises in standing. Attempt ball on wall.     Consulted and Agree with Plan of Care  Patient       Patient will benefit from skilled therapeutic intervention in order to improve the following deficits and impairments:  Decreased range of motion, Pain, Impaired UE functional use, Decreased strength, Decreased mobility  Visit Diagnosis: Acute pain of left shoulder  Stiffness of left shoulder, not elsewhere classified  Other symptoms and signs involving the musculoskeletal system    Problem List Patient Active Problem List   Diagnosis Date Noted  . Dementia, neurological 02/14/2017  . Encounter for antineoplastic immunotherapy 01/02/2017  . Goals of care, counseling/discussion 01/02/2017  . Cerebrovascular accident (CVA) due to thrombosis of right carotid artery ([place]) 12/20/2016  . Cerebrovascular accident (CVA) due to  embolism of cerebral artery ([place]) 12/20/2016  . Memory loss 12/20/2016  . Dehydration 11/15/2016  . Encounter for antineoplastic chemotherapy 10/17/2016  . Malnutrition of moderate degree 10/06/2016  . Urinary tract infection 10/03/2016  . T1b N2 M0 disease (stage IIIA).adenocarcinoma of the right middle lobe of lung ([place]) 09/20/2016  . Peripheral edema   . Slow transit constipation   . Acute lower UTI   . Confusion   . SOB (shortness of breath) 09/02/2016  . Urinary incontinence   . Abnormal urinalysis   . Leukocytosis   . HCAP (healthcare-associated pneumonia)   . Hypoalbuminemia due to protein-calorie malnutrition ([place])   . Hypokalemia   . Hyponatremia   . Urinary retention   . Sleep disturbance   . Acute bilateral deep vein thrombosis (DVT) of popliteal veins ([facility])   . Brain mass 08/31/2016  . Debility   . Benign essential HTN   . Scrotal edema   . Hyperlipidemia   . Constipation due to pain medication   . Fall from height of greater than 3 feet   . Acute  blood loss anemia   . Lung mass   . Post-operative pain   . PVD (peripheral vascular disease) ([place])   . Coronary artery disease involving coronary bypass graft of native heart without angina pectoris   . PAF (paroxysmal atrial fibrillation) ([place])   . Disorder of left sacroiliac joint 08/24/2016  . Fall 08/22/2016  . Closed pelvic ring fracture ([place])   . Carotid artery stenosis, asymptomatic 08/12/2014  . Ejection murmur 07/03/2014  . Aftercare following surgery of the circulatory system, [place] 02/11/2014  . Weakness-Abdomin and Bilateral Thigh / Leg 02/11/2014  . Carotid stenosis 07/19/2013  . Occlusion and stenosis of carotid artery without mention of cerebral infarction 07/16/2013  . Impaired fasting glucose 07/14/2013  . Knee pain 05/23/2011  . Stiffness of joint, not elsewhere classified, lower leg 05/23/2011  . Muscle weakness (generalized) 05/23/2011  . Difficulty in walking(719.7) 05/23/2011  . Bruit  11/19/2010  . AAA (abdominal aortic aneurysm) ([place]) 11/19/2010  . OTHER DYSPHAGIA 04/27/2010  . ATRIAL FIBRILLATION 12/10/2008  . JOINT EFFUSION, KNEE 01/14/2008  . KNEE PAIN 01/14/2008  . KNEE, ARTHRITIS, DEGEN./OSTEO 12/11/2007  . Hypothyroidism 10/10/2007  . Pure hypercholesterolemia 10/10/2007  . Essential hypertension 10/10/2007  . Coronary atherosclerosis 10/10/2007  . Peripheral vascular disease ([place]) 10/10/2007  . CHRONIC OBSTRUCTIVE PULMONARY DISEASE 10/10/2007  . GERD 10/10/2007  . DIVERTICULOSIS OF COLON 10/10/2007  . OSTEOPOROSIS 10/10/2007  . NEPHROLITHIASIS, HX OF 10/10/2007  . Personal history of surgery to heart and great vessels, presenting hazards to health 10/10/2007  . CHOLECYSTECTOMY, HX OF 10/10/2007  . CORONARY ARTERY BYPASS GRAFT, HX OF 10/10/2007    Toney Rakes, OT Student  03/07/2018, 1:08 PM  [place], [place], [address] Phone: [phone]   Fax:  [phone]  Name: [name] [name] MRN: [id number] Date of Birth: 10/21/1940

## 2018-03-09 ENCOUNTER — Ambulatory Visit (HOSPITAL_COMMUNITY): Payer: Medicare Other

## 2018-03-09 ENCOUNTER — Encounter (HOSPITAL_COMMUNITY): Payer: Self-pay

## 2018-03-09 DIAGNOSIS — M25512 Pain in left shoulder: Secondary | ICD-10-CM | POA: Diagnosis not present

## 2018-03-09 DIAGNOSIS — R29898 Other symptoms and signs involving the musculoskeletal system: Secondary | ICD-10-CM | POA: Diagnosis not present

## 2018-03-09 DIAGNOSIS — M25612 Stiffness of left shoulder, not elsewhere classified: Secondary | ICD-10-CM | POA: Diagnosis not present

## 2018-03-09 NOTE — Therapy (Signed)
[place], [place], [address] Phone: [phone]   Fax:  [phone]  Occupational Therapy Treatment  Patient Details  Name: [name] [name] MRN: [id number] Date of Birth: 12-24-1940 Referring Provider: Dr. Arther Abbott   Encounter Date: 03/09/2018  OT End of Session - 03/09/18 1155    Visit Number  5    Number of Visits  9    Date for OT Re-Evaluation  03/21/18    Authorization Type  1) Medicare 2) BSBC    OT Start Time  1125   Pt used the bathroom before session   OT Stop Time  1200    OT Time Calculation (min)  35 min    Activity Tolerance  Patient tolerated treatment well    Behavior During Therapy  [facility] for tasks assessed/performed       Past Medical History:  Diagnosis Date  . Adenocarcinoma of right lung, stage 3 ([place]) 09/20/2016  . Atrial fibrillation ([place])    no hx of reported at preop visit of 04/21/11   . CAD (coronary artery disease)   . Carotid artery occlusion    left carotid endarterectomy  . Chronic kidney disease    AAA repair with reimplant of renals   . CHRONIC OBSTRUCTIVE PULMONARY DISEASE    pt denied at visit of 04/21/11   . CORONARY ARTERY DISEASE   . Dehydration 11/15/2016  . Disorder of left sacroiliac joint 08/24/2016  . DIVERTICULOSIS OF COLON   . Encounter for antineoplastic chemotherapy 10/17/2016  . GERD   . H/O hiatal hernia   . Headache(784.0)    Hx: of years of years  . HYPERTENSION   . HYPOTHYROIDISM   . JOINT EFFUSION, KNEE   . KNEE, ARTHRITIS, DEGEN./OSTEO    right knee   . Myocardial infarction ([place])    1994   . NEPHROLITHIASIS, HX OF   . OSTEOPOROSIS   . Other dysphagia   . PERIPHERAL VASCULAR DISEASE    AAA - 1994 with reimplant of renals   . Peripheral vascular disease ([place])    subclavian stenosis PTA - 3/08   . Pure hypercholesterolemia   . Renal artery stenosis [facility])     Past Surgical History:  Procedure Laterality Date  . ABDOMINAL AORTIC  ANEURYSM REPAIR     with reimplantation of renals   . CARDIAC CATHETERIZATION     2008  . CAROTID ENDARTERECTOMY Left Jan. 30, 2015   CEA  . CHOLECYSTECTOMY  2000   Gall Bladder  . COLONOSCOPY W/ BIOPSIES AND POLYPECTOMY     Hx: of  . CORONARY ARTERY BYPASS GRAFT     1995  . CORONARY STENT PLACEMENT     Hx: of  . ENDARTERECTOMY Left 07/19/2013   Procedure: ENDARTERECTOMY CAROTID;  Surgeon: Mal Misty, MD;  Location: [place];  Service: Vascular;  Laterality: Left;  . GALLBLADDER SURGERY  2004   . JOINT REPLACEMENT     partial knee replacement on left 2002   . KNEE SURGERY    . ORIF PELVIC FRACTURE Left 08/25/2016   Procedure: OPEN REDUCTION INTERNAL FIXATION (ORIF) PELVIC FRACTURE; plate on front, SI screw on the back;  Surgeon: Altamese , MD;  Location: [place];  Service: Orthopedics;  Laterality: Left;  . OTHER SURGICAL HISTORY     left subclavian stenosis surgery PTA 08/2006   . OTHER SURGICAL HISTORY     carotid surgery on right 2004   . TOTAL KNEE ARTHROPLASTY  04/29/2011   Procedure: TOTAL KNEE ARTHROPLASTY;  Surgeon: Gearlean Alf;  [facility];  Service: Orthopedics;  Laterality: Right;  . urinary retention    . VASCULAR SURGERY     AAA  . VASECTOMY  1973  . VIDEO BRONCHOSCOPY WITH ENDOBRONCHIAL ULTRASOUND [name] 09/09/2016   Procedure: VIDEO BRONCHOSCOPY WITH ENDOBRONCHIAL ULTRASOUND;  Surgeon: Grace Isaac, MD;  Location: [place];  Service: Thoracic;  Laterality: [name];    There were no vitals filed for this visit.  Subjective Assessment - 03/09/18 1133    Subjective   S: It only hurts when I move it to certain positions.     Currently in Pain?  No/denies         [facility] OT Assessment - 03/09/18 1133      Assessment   Medical Diagnosis  Left shoulder pain      Precautions   Precautions  None               OT Treatments/Exercises (OP) - 03/09/18 1134      Exercises   Exercises  Shoulder      Shoulder Exercises: Supine   Protraction  PROM;5  reps    Horizontal ABduction  PROM;5 reps    External Rotation  PROM;5 reps    Internal Rotation  PROM;5 reps    Flexion  PROM;5 reps    ABduction  PROM;5 reps      Shoulder Exercises: Standing   Horizontal ABduction  AROM;10 reps    External Rotation  AROM;10 reps    Internal Rotation  AROM;10 reps    ABduction  AROM;10 reps    ABduction Limitations  to 90 degrees      Shoulder Exercises: ROM/Strengthening   UBE (Upper Arm Bike)  level 1 2' reverse 2' forward   pace: 3.0-3.5   Wall Wash  1'    Proximal Shoulder Strengthening, Supine  10X with max verbal and physical demonstration provided    Other ROM/Strengthening Exercises  Green therapy ball: shoulder flexion and chest press; 10X    Other ROM/Strengthening Exercises  PVC pipe slide; 10X               OT Short Term Goals - 03/02/18 1005      OT SHORT TERM GOAL #1   Title  Patient will be educated and independent with HEP to increase functional use and mobility of LUE as needed for daily tasks.     Time  3    Period  Weeks    Status  On-going      OT SHORT TERM GOAL #2   Title  Patient will report a decrease in pain level to approximately 4/10 when completing simple daily tasks using his LUE.     Time  3    Period  Weeks    Status  On-going      OT SHORT TERM GOAL #3   Title  Patient will increase his LUE shoulder and scapular strength to 4/5 overall while presenting in increase posture and increase ability to complete lightweighting lifting tasks.     Time  3    Period  Weeks    Status  On-going      OT SHORT TERM GOAL #4   Title  Patient will increase his LUE A/ROM to [facility] to increase ability to complete all dressing tasks such as buckling his belt and pants with less difficulty.     Time  3    Period  Weeks  Status  On-going               Plan - 03/09/18 1218    Clinical Impression Statement  A: Patient continues to be limited with A/ROM above 90 degrees for flexion and abduction. Form was  poor during therapy ball exercises. Patient is limited at this point to reaching tasks at or below shoulder height with any higher causing pain.     Plan  P: Attempt ball on the wall using a lightweight ball. Add red scapular theraband exercises.     Consulted and Agree with Plan of Care  Patient       Patient will benefit from skilled therapeutic intervention in order to improve the following deficits and impairments:  Decreased range of motion, Pain, Impaired UE functional use, Decreased strength, Decreased mobility  Visit Diagnosis: Acute pain of left shoulder  Stiffness of left shoulder, not elsewhere classified  Other symptoms and signs involving the musculoskeletal system    Problem List Patient Active Problem List   Diagnosis Date Noted  . Dementia, neurological 02/14/2017  . Encounter for antineoplastic immunotherapy 01/02/2017  . Goals of care, counseling/discussion 01/02/2017  . Cerebrovascular accident (CVA) due to thrombosis of right carotid artery ([place]) 12/20/2016  . Cerebrovascular accident (CVA) due to embolism of cerebral artery ([place]) 12/20/2016  . Memory loss 12/20/2016  . Dehydration 11/15/2016  . Encounter for antineoplastic chemotherapy 10/17/2016  . Malnutrition of moderate degree 10/06/2016  . Urinary tract infection 10/03/2016  . T1b N2 M0 disease (stage IIIA).adenocarcinoma of the right middle lobe of lung ([place]) 09/20/2016  . Peripheral edema   . Slow transit constipation   . Acute lower UTI   . Confusion   . SOB (shortness of breath) 09/02/2016  . Urinary incontinence   . Abnormal urinalysis   . Leukocytosis   . HCAP (healthcare-associated pneumonia)   . Hypoalbuminemia due to protein-calorie malnutrition ([place])   . Hypokalemia   . Hyponatremia   . Urinary retention   . Sleep disturbance   . Acute bilateral deep vein thrombosis (DVT) of popliteal veins ([facility])   . Brain mass 08/31/2016  . Debility   . Benign essential HTN   . Scrotal edema   .  Hyperlipidemia   . Constipation due to pain medication   . Fall from height of greater than 3 feet   . Acute blood loss anemia   . Lung mass   . Post-operative pain   . PVD (peripheral vascular disease) ([place])   . Coronary artery disease involving coronary bypass graft of native heart without angina pectoris   . PAF (paroxysmal atrial fibrillation) ([place])   . Disorder of left sacroiliac joint 08/24/2016  . Fall 08/22/2016  . Closed pelvic ring fracture ([place])   . Carotid artery stenosis, asymptomatic 08/12/2014  . Ejection murmur 07/03/2014  . Aftercare following surgery of the circulatory system, [place] 02/11/2014  . Weakness-Abdomin and Bilateral Thigh / Leg 02/11/2014  . Carotid stenosis 07/19/2013  . Occlusion and stenosis of carotid artery without mention of cerebral infarction 07/16/2013  . Impaired fasting glucose 07/14/2013  . Knee pain 05/23/2011  . Stiffness of joint, not elsewhere classified, lower leg 05/23/2011  . Muscle weakness (generalized) 05/23/2011  . Difficulty in walking(719.7) 05/23/2011  . Bruit 11/19/2010  . AAA (abdominal aortic aneurysm) ([place]) 11/19/2010  . OTHER DYSPHAGIA 04/27/2010  . ATRIAL FIBRILLATION 12/10/2008  . JOINT EFFUSION, KNEE 01/14/2008  . KNEE PAIN 01/14/2008  . KNEE, ARTHRITIS, DEGEN./OSTEO 12/11/2007  . Hypothyroidism 10/10/2007  .  Pure hypercholesterolemia 10/10/2007  . Essential hypertension 10/10/2007  . Coronary atherosclerosis 10/10/2007  . Peripheral vascular disease ([place]) 10/10/2007  . CHRONIC OBSTRUCTIVE PULMONARY DISEASE 10/10/2007  . GERD 10/10/2007  . DIVERTICULOSIS OF COLON 10/10/2007  . OSTEOPOROSIS 10/10/2007  . NEPHROLITHIASIS, HX OF 10/10/2007  . Personal history of surgery to heart and great vessels, presenting hazards to health 10/10/2007  . CHOLECYSTECTOMY, HX OF 10/10/2007  . CORONARY ARTERY BYPASS GRAFT, HX OF 10/10/2007   Ailene Ravel, OTR/L,CBIS  [phone]  03/09/2018, 12:24 PM  [place] [address] [place], [place], [address] Phone: [phone]   Fax:  [phone]  Name: MAXIMO SPRATLING MRN: [id number] Date of Birth: 02/15/1941

## 2018-03-12 ENCOUNTER — Encounter (HOSPITAL_COMMUNITY): Payer: Self-pay

## 2018-03-12 ENCOUNTER — Other Ambulatory Visit: Payer: Self-pay | Admitting: Family Medicine

## 2018-03-12 ENCOUNTER — Ambulatory Visit (HOSPITAL_COMMUNITY): Payer: Medicare Other

## 2018-03-12 DIAGNOSIS — M25612 Stiffness of left shoulder, not elsewhere classified: Secondary | ICD-10-CM

## 2018-03-12 DIAGNOSIS — M25512 Pain in left shoulder: Secondary | ICD-10-CM | POA: Diagnosis not present

## 2018-03-12 DIAGNOSIS — R29898 Other symptoms and signs involving the musculoskeletal system: Secondary | ICD-10-CM | POA: Diagnosis not present

## 2018-03-12 NOTE — Therapy (Signed)
[place], [place], [address] Phone: [phone]   Fax:  [phone]  Occupational Therapy Treatment  Patient Details  Name: [name] [name] MRN: [id number] Date of Birth: 13-Jun-1941 Referring Provider: Dr. Arther Abbott   Encounter Date: 03/12/2018  OT End of Session - 03/12/18 1510    Visit Number  6    Number of Visits  9    Date for OT Re-Evaluation  03/21/18    Authorization Type  1) Medicare 2) BSBC    OT Start Time  1438   Pt was in bathroom   OT Stop Time  1515    OT Time Calculation (min)  37 min    Activity Tolerance  Patient tolerated treatment well    Behavior During Therapy  [facility] for tasks assessed/performed       Past Medical History:  Diagnosis Date  . Adenocarcinoma of right lung, stage 3 ([place]) 09/20/2016  . Atrial fibrillation ([place])    no hx of reported at preop visit of 04/21/11   . CAD (coronary artery disease)   . Carotid artery occlusion    left carotid endarterectomy  . Chronic kidney disease    AAA repair with reimplant of renals   . CHRONIC OBSTRUCTIVE PULMONARY DISEASE    pt denied at visit of 04/21/11   . CORONARY ARTERY DISEASE   . Dehydration 11/15/2016  . Disorder of left sacroiliac joint 08/24/2016  . DIVERTICULOSIS OF COLON   . Encounter for antineoplastic chemotherapy 10/17/2016  . GERD   . H/O hiatal hernia   . Headache(784.0)    Hx: of years of years  . HYPERTENSION   . HYPOTHYROIDISM   . JOINT EFFUSION, KNEE   . KNEE, ARTHRITIS, DEGEN./OSTEO    right knee   . Myocardial infarction ([place])    1994   . NEPHROLITHIASIS, HX OF   . OSTEOPOROSIS   . Other dysphagia   . PERIPHERAL VASCULAR DISEASE    AAA - 1994 with reimplant of renals   . Peripheral vascular disease ([place])    subclavian stenosis PTA - 3/08   . Pure hypercholesterolemia   . Renal artery stenosis [facility])     Past Surgical History:  Procedure Laterality Date  . ABDOMINAL AORTIC ANEURYSM REPAIR     with reimplantation of renals   . CARDIAC CATHETERIZATION     2008  . CAROTID ENDARTERECTOMY Left Jan. 30, 2015   CEA  . CHOLECYSTECTOMY  2000   Gall Bladder  . COLONOSCOPY W/ BIOPSIES AND POLYPECTOMY     Hx: of  . CORONARY ARTERY BYPASS GRAFT     1995  . CORONARY STENT PLACEMENT     Hx: of  . ENDARTERECTOMY Left 07/19/2013   Procedure: ENDARTERECTOMY CAROTID;  Surgeon: Mal Misty, MD;  Location: [place];  Service: Vascular;  Laterality: Left;  . GALLBLADDER SURGERY  2004   . JOINT REPLACEMENT     partial knee replacement on left 2002   . KNEE SURGERY    . ORIF PELVIC FRACTURE Left 08/25/2016   Procedure: OPEN REDUCTION INTERNAL FIXATION (ORIF) PELVIC FRACTURE; plate on front, SI screw on the back;  Surgeon: Altamese Clarendon, MD;  Location: [place];  Service: Orthopedics;  Laterality: Left;  . OTHER SURGICAL HISTORY     left subclavian stenosis surgery PTA 08/2006   . OTHER SURGICAL HISTORY     carotid surgery on right 2004   . TOTAL KNEE ARTHROPLASTY  04/29/2011  Procedure: TOTAL KNEE ARTHROPLASTY;  Surgeon: Gearlean Alf;  [facility];  Service: Orthopedics;  Laterality: Right;  . urinary retention    . VASCULAR SURGERY     AAA  . VASECTOMY  1973  . VIDEO BRONCHOSCOPY WITH ENDOBRONCHIAL ULTRASOUND [name] 09/09/2016   Procedure: VIDEO BRONCHOSCOPY WITH ENDOBRONCHIAL ULTRASOUND;  Surgeon: Grace Isaac, MD;  Location: [place];  Service: Thoracic;  Laterality: [name];    There were no vitals filed for this visit.  Subjective Assessment - 03/12/18 1458    Subjective   S: It's trying to buckle my belt that gets me the most.     Currently in Pain?  No/denies         [facility] OT Assessment - 03/12/18 1459      Assessment   Medical Diagnosis  Left shoulder pain      Precautions   Precautions  None               OT Treatments/Exercises (OP) - 03/12/18 1459      Exercises   Exercises  Shoulder      Shoulder Exercises: Supine   Protraction  PROM;5 reps     Horizontal ABduction  PROM;5 reps    External Rotation  PROM;5 reps    Internal Rotation  PROM;5 reps    Flexion  PROM;5 reps    ABduction  PROM;5 reps      Shoulder Exercises: Seated   Protraction  AROM;10 reps    Horizontal ABduction  AROM;10 reps    External Rotation  AROM;10 reps    Internal Rotation  AROM;10 reps    Flexion  AROM;10 reps    Abduction  AROM;10 reps      Shoulder Exercises: Standing   Extension  Theraband;10 reps    Theraband Level (Shoulder Extension)  Level 2 (Red)    Row  Theraband;10 reps    Theraband Level (Shoulder Row)  Level 2 (Red)    Retraction  Theraband;10 reps    Theraband Level (Shoulder Retraction)  Level 2 (Red)      Shoulder Exercises: ROM/Strengthening   UBE (Upper Arm Bike)  level 1 2' reverse 2' forward   pace: 3.0-3.5   X to V Arms  10X with verbal and visual cueing    Ball on Wall  using saebo ball; 1' flexion 1' abduction               OT Short Term Goals - 03/02/18 1005      OT SHORT TERM GOAL #1   Title  Patient will be educated and independent with HEP to increase functional use and mobility of LUE as needed for daily tasks.     Time  3    Period  Weeks    Status  On-going      OT SHORT TERM GOAL #2   Title  Patient will report a decrease in pain level to approximately 4/10 when completing simple daily tasks using his LUE.     Time  3    Period  Weeks    Status  On-going      OT SHORT TERM GOAL #3   Title  Patient will increase his LUE shoulder and scapular strength to 4/5 overall while presenting in increase posture and increase ability to complete lightweighting lifting tasks.     Time  3    Period  Weeks    Status  On-going      OT SHORT TERM GOAL #4  Title  Patient will increase his LUE A/ROM to [facility] to increase ability to complete all dressing tasks such as buckling his belt and pants with less difficulty.     Time  3    Period  Weeks    Status  On-going                 Patient will benefit  from skilled therapeutic intervention in order to improve the following deficits and impairments:     Visit Diagnosis: Acute pain of left shoulder  Stiffness of left shoulder, not elsewhere classified  Other symptoms and signs involving the musculoskeletal system    Problem List Patient Active Problem List   Diagnosis Date Noted  . Dementia, neurological 02/14/2017  . Encounter for antineoplastic immunotherapy 01/02/2017  . Goals of care, counseling/discussion 01/02/2017  . Cerebrovascular accident (CVA) due to thrombosis of right carotid artery ([place]) 12/20/2016  . Cerebrovascular accident (CVA) due to embolism of cerebral artery ([place]) 12/20/2016  . Memory loss 12/20/2016  . Dehydration 11/15/2016  . Encounter for antineoplastic chemotherapy 10/17/2016  . Malnutrition of moderate degree 10/06/2016  . Urinary tract infection 10/03/2016  . T1b N2 M0 disease (stage IIIA).adenocarcinoma of the right middle lobe of lung ([place]) 09/20/2016  . Peripheral edema   . Slow transit constipation   . Acute lower UTI   . Confusion   . SOB (shortness of breath) 09/02/2016  . Urinary incontinence   . Abnormal urinalysis   . Leukocytosis   . HCAP (healthcare-associated pneumonia)   . Hypoalbuminemia due to protein-calorie malnutrition ([place])   . Hypokalemia   . Hyponatremia   . Urinary retention   . Sleep disturbance   . Acute bilateral deep vein thrombosis (DVT) of popliteal veins (HCC)   . Brain mass 08/31/2016  . Debility   . Benign essential HTN   . Scrotal edema   . Hyperlipidemia   . Constipation due to pain medication   . Fall from height of greater than 3 feet   . Acute blood loss anemia   . Lung mass   . Post-operative pain   . PVD (peripheral vascular disease) ([place])   . Coronary artery disease involving coronary bypass graft of native heart without angina pectoris   . PAF (paroxysmal atrial fibrillation) ([place])   . Disorder of left sacroiliac joint 08/24/2016  . Fall  08/22/2016  . Closed pelvic ring fracture ([place])   . Carotid artery stenosis, asymptomatic 08/12/2014  . Ejection murmur 07/03/2014  . Aftercare following surgery of the circulatory system, [place] 02/11/2014  . Weakness-Abdomin and Bilateral Thigh / Leg 02/11/2014  . Carotid stenosis 07/19/2013  . Occlusion and stenosis of carotid artery without mention of cerebral infarction 07/16/2013  . Impaired fasting glucose 07/14/2013  . Knee pain 05/23/2011  . Stiffness of joint, not elsewhere classified, lower leg 05/23/2011  . Muscle weakness (generalized) 05/23/2011  . Difficulty in walking(719.7) 05/23/2011  . Bruit 11/19/2010  . AAA (abdominal aortic aneurysm) ([place]) 11/19/2010  . OTHER DYSPHAGIA 04/27/2010  . ATRIAL FIBRILLATION 12/10/2008  . JOINT EFFUSION, KNEE 01/14/2008  . KNEE PAIN 01/14/2008  . KNEE, ARTHRITIS, DEGEN./OSTEO 12/11/2007  . Hypothyroidism 10/10/2007  . Pure hypercholesterolemia 10/10/2007  . Essential hypertension 10/10/2007  . Coronary atherosclerosis 10/10/2007  . Peripheral vascular disease ([place]) 10/10/2007  . CHRONIC OBSTRUCTIVE PULMONARY DISEASE 10/10/2007  . GERD 10/10/2007  . DIVERTICULOSIS OF COLON 10/10/2007  . OSTEOPOROSIS 10/10/2007  . NEPHROLITHIASIS, HX OF 10/10/2007  . Personal history of surgery to heart  and great vessels, presenting hazards to health 10/10/2007  . CHOLECYSTECTOMY, HX OF 10/10/2007  . CORONARY ARTERY BYPASS GRAFT, HX OF 10/10/2007   Ailene Ravel, OTR/L,CBIS  [phone]  03/12/2018, 3:12 PM  [place], [place], [address] Phone: [phone]   Fax:  [phone]  Name: CUAUHTEMOC HUEGEL MRN: [id number] Date of Birth: [birth year]

## 2018-03-14 ENCOUNTER — Ambulatory Visit (HOSPITAL_COMMUNITY): Payer: Medicare Other

## 2018-03-14 ENCOUNTER — Encounter (HOSPITAL_COMMUNITY): Payer: Self-pay

## 2018-03-14 DIAGNOSIS — M25612 Stiffness of left shoulder, not elsewhere classified: Secondary | ICD-10-CM | POA: Diagnosis not present

## 2018-03-14 DIAGNOSIS — R29898 Other symptoms and signs involving the musculoskeletal system: Secondary | ICD-10-CM

## 2018-03-14 DIAGNOSIS — M25512 Pain in left shoulder: Secondary | ICD-10-CM

## 2018-03-14 NOTE — Therapy (Signed)
[place], [place], [address] Phone: [phone]   Fax:  [phone]  Occupational Therapy Treatment  Patient Details  Name: [name] [name] MRN: [id number] Date of Birth: 06-Jul-1940 Referring Provider: Dr. Arther Abbott   Encounter Date: 03/14/2018  OT End of Session - 03/14/18 0944    Visit Number  7    Number of Visits  9    Date for OT Re-Evaluation  03/21/18    Authorization Type  1) Medicare 2) BSBC    OT Start Time  0907    OT Stop Time  0945    OT Time Calculation (min)  38 min    Activity Tolerance  Patient tolerated treatment well    Behavior During Therapy  [facility] for tasks assessed/performed       Past Medical History:  Diagnosis Date  . Adenocarcinoma of right lung, stage 3 ([place]) 09/20/2016  . Atrial fibrillation ([place])    no hx of reported at preop visit of 04/21/11   . CAD (coronary artery disease)   . Carotid artery occlusion    left carotid endarterectomy  . Chronic kidney disease    AAA repair with reimplant of renals   . CHRONIC OBSTRUCTIVE PULMONARY DISEASE    pt denied at visit of 04/21/11   . CORONARY ARTERY DISEASE   . Dehydration 11/15/2016  . Disorder of left sacroiliac joint 08/24/2016  . DIVERTICULOSIS OF COLON   . Encounter for antineoplastic chemotherapy 10/17/2016  . GERD   . H/O hiatal hernia   . Headache(784.0)    Hx: of years of years  . HYPERTENSION   . HYPOTHYROIDISM   . JOINT EFFUSION, KNEE   . KNEE, ARTHRITIS, DEGEN./OSTEO    right knee   . Myocardial infarction ([place])    1994   . NEPHROLITHIASIS, HX OF   . OSTEOPOROSIS   . Other dysphagia   . PERIPHERAL VASCULAR DISEASE    AAA - 1994 with reimplant of renals   . Peripheral vascular disease ([place])    subclavian stenosis PTA - 3/08   . Pure hypercholesterolemia   . Renal artery stenosis [facility])     Past Surgical History:  Procedure Laterality Date  . ABDOMINAL AORTIC ANEURYSM REPAIR     with reimplantation of  renals   . CARDIAC CATHETERIZATION     2008  . CAROTID ENDARTERECTOMY Left Jan. 30, 2015   CEA  . CHOLECYSTECTOMY  2000   Gall Bladder  . COLONOSCOPY W/ BIOPSIES AND POLYPECTOMY     Hx: of  . CORONARY ARTERY BYPASS GRAFT     1995  . CORONARY STENT PLACEMENT     Hx: of  . ENDARTERECTOMY Left 07/19/2013   Procedure: ENDARTERECTOMY CAROTID;  Surgeon: Mal Misty, MD;  Location: [place];  Service: Vascular;  Laterality: Left;  . GALLBLADDER SURGERY  2004   . JOINT REPLACEMENT     partial knee replacement on left 2002   . KNEE SURGERY    . ORIF PELVIC FRACTURE Left 08/25/2016   Procedure: OPEN REDUCTION INTERNAL FIXATION (ORIF) PELVIC FRACTURE; plate on front, SI screw on the back;  Surgeon: Altamese [place], MD;  Location: [place];  Service: Orthopedics;  Laterality: Left;  . OTHER SURGICAL HISTORY     left subclavian stenosis surgery PTA 08/2006   . OTHER SURGICAL HISTORY     carotid surgery on right 2004   . TOTAL KNEE ARTHROPLASTY  04/29/2011   Procedure: TOTAL KNEE ARTHROPLASTY;  Surgeon: Dione Plover [name];  [facility];  Service: Orthopedics;  Laterality: Right;  . urinary retention    . VASCULAR SURGERY     AAA  . VASECTOMY  1973  . VIDEO BRONCHOSCOPY WITH ENDOBRONCHIAL ULTRASOUND [name] 09/09/2016   Procedure: VIDEO BRONCHOSCOPY WITH ENDOBRONCHIAL ULTRASOUND;  Surgeon: Grace Isaac, MD;  Location: [place];  Service: Thoracic;  Laterality: [name];    There were no vitals filed for this visit.  Subjective Assessment - 03/14/18 0914    Subjective   S: It doesn't hurt because I haven't done any exercises yet.     Currently in Pain?  No/denies         [facility] OT Assessment - 03/14/18 0915      Assessment   Medical Diagnosis  Left shoulder pain      Precautions   Precautions  None               OT Treatments/Exercises (OP) - 03/14/18 0915      Exercises   Exercises  Shoulder      Shoulder Exercises: Supine   Protraction  PROM;5 reps;Strengthening;10 reps     Protraction Weight (lbs)  1    Horizontal ABduction  PROM;5 reps;Strengthening;10 reps    Horizontal ABduction Weight (lbs)  1    External Rotation  PROM;5 reps;Strengthening;10 reps    External Rotation Weight (lbs)  1    Internal Rotation  PROM;5 reps;Strengthening;10 reps    Internal Rotation Weight (lbs)  1    Flexion  PROM;5 reps;Strengthening;10 reps    Shoulder Flexion Weight (lbs)  1    ABduction  PROM;5 reps;Strengthening;10 reps    Shoulder ABduction Weight (lbs)  1      Shoulder Exercises: Standing   Extension  Theraband;10 reps    Theraband Level (Shoulder Extension)  Level 2 (Red)    Row  Theraband;10 reps    Theraband Level (Shoulder Row)  Level 2 (Red)    Retraction  Theraband;10 reps    Theraband Level (Shoulder Retraction)  Level 2 (Red)      Shoulder Exercises: ROM/Strengthening   UBE (Upper Arm Bike)  level 1 3' reverse only               OT Short Term Goals - 03/02/18 1005      OT SHORT TERM GOAL #1   Title  Patient will be educated and independent with HEP to increase functional use and mobility of LUE as needed for daily tasks.     Time  3    Period  Weeks    Status  On-going      OT SHORT TERM GOAL #2   Title  Patient will report a decrease in pain level to approximately 4/10 when completing simple daily tasks using his LUE.     Time  3    Period  Weeks    Status  On-going      OT SHORT TERM GOAL #3   Title  Patient will increase his LUE shoulder and scapular strength to 4/5 overall while presenting in increase posture and increase ability to complete lightweighting lifting tasks.     Time  3    Period  Weeks    Status  On-going      OT SHORT TERM GOAL #4   Title  Patient will increase his LUE A/ROM to [facility] to increase ability to complete all dressing tasks such as buckling his belt and pants with less difficulty.  Time  3    Period  Weeks    Status  On-going               Plan - 03/14/18 0944    Clinical Impression  Statement  A: Patient has decreased body awareness and muscle guarding during exercises. Decreased scapular mobility and strength noted during theraband row and was unable to activate with verbal and tactile cueing. Mostly limited with any movement involving abduction whether is be supine or standing.     Plan  P: Ball circles with large therapy ball. Take measurements for MD appointment and give to patient to take.     Consulted and Agree with Plan of Care  Patient       Patient will benefit from skilled therapeutic intervention in order to improve the following deficits and impairments:  Decreased range of motion, Pain, Impaired UE functional use, Decreased strength, Decreased mobility  Visit Diagnosis: Acute pain of left shoulder  Stiffness of left shoulder, not elsewhere classified  Other symptoms and signs involving the musculoskeletal system    Problem List Patient Active Problem List   Diagnosis Date Noted  . Dementia, neurological 02/14/2017  . Encounter for antineoplastic immunotherapy 01/02/2017  . Goals of care, counseling/discussion 01/02/2017  . Cerebrovascular accident (CVA) due to thrombosis of right carotid artery ([place]) 12/20/2016  . Cerebrovascular accident (CVA) due to embolism of cerebral artery ([place]) 12/20/2016  . Memory loss 12/20/2016  . Dehydration 11/15/2016  . Encounter for antineoplastic chemotherapy 10/17/2016  . Malnutrition of moderate degree 10/06/2016  . Urinary tract infection 10/03/2016  . T1b N2 M0 disease (stage IIIA).adenocarcinoma of the right middle lobe of lung ([place]) 09/20/2016  . Peripheral edema   . Slow transit constipation   . Acute lower UTI   . Confusion   . SOB (shortness of breath) 09/02/2016  . Urinary incontinence   . Abnormal urinalysis   . Leukocytosis   . HCAP (healthcare-associated pneumonia)   . Hypoalbuminemia due to protein-calorie malnutrition ([place])   . Hypokalemia   . Hyponatremia   . Urinary retention   . Sleep  disturbance   . Acute bilateral deep vein thrombosis (DVT) of popliteal veins (H[facility])   . Brain mass 08/31/2016  . Debility   . Benign essential HTN   . Scrotal edema   . Hyperlipidemia   . Constipation due to pain medication   . Fall from height of greater than 3 feet   . Acute blood loss anemia   . Lung mass   . Post-operative pain   . PVD (peripheral vascular disease) ([place])   . Coronary artery disease involving coronary bypass graft of native heart without angina pectoris   . PAF (paroxysmal atrial fibrillation) ([place])   . Disorder of left sacroiliac joint 08/24/2016  . Fall 08/22/2016  . Closed pelvic ring fracture ([place])   . Carotid artery stenosis, asymptomatic 08/12/2014  . Ejection murmur 07/03/2014  . Aftercare following surgery of the circulatory system, [place] 02/11/2014  . Weakness-Abdomin and Bilateral Thigh / Leg 02/11/2014  . Carotid stenosis 07/19/2013  . Occlusion and stenosis of carotid artery without mention of cerebral infarction 07/16/2013  . Impaired fasting glucose 07/14/2013  . Knee pain 05/23/2011  . Stiffness of joint, not elsewhere classified, lower leg 05/23/2011  . Muscle weakness (generalized) 05/23/2011  . Difficulty in walking(719.7) 05/23/2011  . Bruit 11/19/2010  . AAA (abdominal aortic aneurysm) ([place]) 11/19/2010  . OTHER DYSPHAGIA 04/27/2010  . ATRIAL FIBRILLATION 12/10/2008  . JOINT EFFUSION,  KNEE 01/14/2008  . KNEE PAIN 01/14/2008  . KNEE, ARTHRITIS, DEGEN./OSTEO 12/11/2007  . Hypothyroidism 10/10/2007  . Pure hypercholesterolemia 10/10/2007  . Essential hypertension 10/10/2007  . Coronary atherosclerosis 10/10/2007  . Peripheral vascular disease ([place]) 10/10/2007  . CHRONIC OBSTRUCTIVE PULMONARY DISEASE 10/10/2007  . GERD 10/10/2007  . DIVERTICULOSIS OF COLON 10/10/2007  . OSTEOPOROSIS 10/10/2007  . NEPHROLITHIASIS, HX OF 10/10/2007  . Personal history of surgery to heart and great vessels, presenting hazards to health 10/10/2007  .  CHOLECYSTECTOMY, HX OF 10/10/2007  . CORONARY ARTERY BYPASS GRAFT, HX OF 10/10/2007    Ailene Ravel, OTR/L,CBIS  [phone]  03/14/2018, 9:48 AM  [facility] [address] [place], [place], [address] Phone: [phone]   Fax:  [phone]  Name: [name] [name] MRN: [id number] Date of Birth: [birth year]

## 2018-03-16 ENCOUNTER — Encounter: Payer: Self-pay | Admitting: Orthopedic Surgery

## 2018-03-16 ENCOUNTER — Ambulatory Visit (INDEPENDENT_AMBULATORY_CARE_PROVIDER_SITE_OTHER): Payer: Medicare Other | Admitting: Orthopedic Surgery

## 2018-03-16 ENCOUNTER — Encounter (HOSPITAL_COMMUNITY): Payer: Self-pay

## 2018-03-16 ENCOUNTER — Ambulatory Visit (HOSPITAL_COMMUNITY): Payer: Medicare Other

## 2018-03-16 VITALS — BP 123/68 | HR 68 | Ht 67.0 in | Wt 128.0 lb

## 2018-03-16 DIAGNOSIS — M7022 Olecranon bursitis, left elbow: Secondary | ICD-10-CM

## 2018-03-16 DIAGNOSIS — I6529 Occlusion and stenosis of unspecified carotid artery: Secondary | ICD-10-CM

## 2018-03-16 DIAGNOSIS — M25612 Stiffness of left shoulder, not elsewhere classified: Secondary | ICD-10-CM

## 2018-03-16 DIAGNOSIS — M75102 Unspecified rotator cuff tear or rupture of left shoulder, not specified as traumatic: Secondary | ICD-10-CM

## 2018-03-16 DIAGNOSIS — M25512 Pain in left shoulder: Secondary | ICD-10-CM | POA: Diagnosis not present

## 2018-03-16 DIAGNOSIS — R29898 Other symptoms and signs involving the musculoskeletal system: Secondary | ICD-10-CM | POA: Diagnosis not present

## 2018-03-16 NOTE — Progress Notes (Signed)
NEW PATIENT OFFICE VISI  Chief Complaint  Patient presents with  . Shoulder Pain    Left shoulder pain    77 year old male presents with 53-month history of atraumatic onset of left shoulder pain.  He says he was putting his coat on fell into a coat hook and since that time is has pain over the anterolateral aspect of his left shoulder which interferes with his ability to raise his arm and also causes him pain when he lies down on his left side or rolls onto it  Location of pain left shoulder quality dull ache sharp at times, severity mild but moderate with activity, timing intermittent duration 2 months context trauma modifying factors lifting his arm over his head which is difficult  Shoulder Pain   Pertinent negatives include no tingling.   The review of systems below is updated from the prior visit August 23, no changes Review of Systems  Constitutional: Positive for malaise/fatigue and weight loss.  Musculoskeletal: Positive for joint pain and neck pain.  Neurological: Negative for tingling.   Prior history as noted above.  Patient comes in says that his shoulder is much better after physical therapy.  He has no pain and he is regained almost all of his motion.  However, he now has a new complaint of swelling left elbow mild discomfort.  Swelling started near the time he hurt his shoulder about 2 months ago.  He says he is concerned about what is in the area of swelling over the left elbow there was no associated symptoms of fever loss of motion or weakness of the left elbow  Past Medical History:  Diagnosis Date  . Adenocarcinoma of right lung, stage 3 ([place]) 09/20/2016  . Atrial fibrillation ([place])    no hx of reported at preop visit of 04/21/11   . CAD (coronary artery disease)   . Carotid artery occlusion    left carotid endarterectomy  . Chronic kidney disease    AAA repair with reimplant of renals   . CHRONIC OBSTRUCTIVE PULMONARY DISEASE    pt denied at visit of 04/21/11    . CORONARY ARTERY DISEASE   . Dehydration 11/15/2016  . Disorder of left sacroiliac joint 08/24/2016  . DIVERTICULOSIS OF COLON   . Encounter for antineoplastic chemotherapy 10/17/2016  . GERD   . H/O hiatal hernia   . Headache(784.0)    Hx: of years of years  . HYPERTENSION   . HYPOTHYROIDISM   . JOINT EFFUSION, KNEE   . KNEE, ARTHRITIS, DEGEN./OSTEO    right knee   . Myocardial infarction ([place])    1994   . NEPHROLITHIASIS, HX OF   . OSTEOPOROSIS   . Other dysphagia   . PERIPHERAL VASCULAR DISEASE    AAA - 1994 with reimplant of renals   . Peripheral vascular disease ([place])    subclavian stenosis PTA - 3/08   . Pure hypercholesterolemia   . Renal artery stenosis ([facility])    BP 123/68   Pulse 68   Ht 5\' 7"  (1.702 m)   Wt 128 lb (58.1 kg)   BMI 20.05 kg/m   Physical Exam  Constitutional: He is oriented to person, place, and time. He appears well-developed and well-nourished.  Vital signs have been reviewed and are stable. Gen. appearance the patient is well-developed and well-nourished with normal grooming and hygiene.   Neurological: He is alert and oriented to person, place, and time.  Skin: Skin is warm and dry. No erythema.  Psychiatric: He  has a normal mood and affect.  Vitals reviewed.   Left Shoulder Exam   Tenderness  The patient is experiencing no tenderness.   Range of Motion  Active abduction: 100  Forward flexion: 140   Muscle Strength  Internal rotation: 5/5  External rotation: 5/5  Supraspinatus: 4/5   Tests  Apprehension: negative Hawkins test: negative Impingement: negative  Comments:  Left elbow is swollen with a fluid-filled sac.  This is not tender to palpation.  There is no erythema over the elbow.  Overall range of motion is normal elbow was stable.  Extension power is normal.  Again distal pulse and perfusion are normal and there are no sensory deficits.       MEDICAL DECISION SECTION  Xrays were done at [facility]  My independent reading of xrays:  Proximal migration of the humeral head without joint space narrowing type I acromion please see report  Encounter Diagnoses  Name Primary?  [name] Nontraumatic tear of left rotator cuff, unspecified tear extent  old problem improved  . Olecranon bursitis of left elbow  new problem   The patient wanted aspiration of the elbow  So we prepared aspiration we took a timeout to confirm the site we aspirated approximately 25 cc of yellow fluid.  We placed a Band-Aid there were no complications  With Arther Abbott, MD  03/16/2018 1:01 PM

## 2018-03-16 NOTE — Therapy (Signed)
[place], [place], [address] Phone: [phone]   Fax:  [phone]  Occupational Therapy Treatment  Patient Details  Name: [name] [name] MRN: [id number] Date of Birth: June 22, 1940 No data recorded  Encounter Date: 03/16/2018  OT End of Session - 03/16/18 0942    Visit Number  8    Number of Visits  9    Date for OT Re-Evaluation  03/21/18    Authorization Type  1) Medicare 2) BSBC    OT Start Time  0909   measurements   OT Stop Time  0945    OT Time Calculation (min)  36 min    Activity Tolerance  Patient tolerated treatment well    Behavior During Therapy  [facility] for tasks assessed/performed       Past Medical History:  Diagnosis Date  . Adenocarcinoma of right lung, stage 3 ([place]) 09/20/2016  . Atrial fibrillation ([place])    no hx of reported at preop visit of 04/21/11   . CAD (coronary artery disease)   . Carotid artery occlusion    left carotid endarterectomy  . Chronic kidney disease    AAA repair with reimplant of renals   . CHRONIC OBSTRUCTIVE PULMONARY DISEASE    pt denied at visit of 04/21/11   . CORONARY ARTERY DISEASE   . Dehydration 11/15/2016  . Disorder of left sacroiliac joint 08/24/2016  . DIVERTICULOSIS OF COLON   . Encounter for antineoplastic chemotherapy 10/17/2016  . GERD   . H/O hiatal hernia   . Headache(784.0)    Hx: of years of years  . HYPERTENSION   . HYPOTHYROIDISM   . JOINT EFFUSION, KNEE   . KNEE, ARTHRITIS, DEGEN./OSTEO    right knee   . Myocardial infarction ([place])    1994   . NEPHROLITHIASIS, HX OF   . OSTEOPOROSIS   . Other dysphagia   . PERIPHERAL VASCULAR DISEASE    AAA - 1994 with reimplant of renals   . Peripheral vascular disease ([place])    subclavian stenosis PTA - 3/08   . Pure hypercholesterolemia   . Renal artery stenosis [facility])     Past Surgical History:  Procedure Laterality Date  . ABDOMINAL AORTIC ANEURYSM REPAIR     with reimplantation of renals   .  CARDIAC CATHETERIZATION     2008  . CAROTID ENDARTERECTOMY Left Jan. 30, 2015   CEA  . CHOLECYSTECTOMY  2000   Gall Bladder  . COLONOSCOPY W/ BIOPSIES AND POLYPECTOMY     Hx: of  . CORONARY ARTERY BYPASS GRAFT     1995  . CORONARY STENT PLACEMENT     Hx: of  . ENDARTERECTOMY Left 07/19/2013   Procedure: ENDARTERECTOMY CAROTID;  Surgeon: Mal Misty, MD;  Location: [place];  Service: Vascular;  Laterality: Left;  . GALLBLADDER SURGERY  2004   . JOINT REPLACEMENT     partial knee replacement on left 2002   . KNEE SURGERY    . ORIF PELVIC FRACTURE Left 08/25/2016   Procedure: OPEN REDUCTION INTERNAL FIXATION (ORIF) PELVIC FRACTURE; plate on front, SI screw on the back;  Surgeon: Altamese Mattydale, MD;  Location: [place];  Service: Orthopedics;  Laterality: Left;  . OTHER SURGICAL HISTORY     left subclavian stenosis surgery PTA 08/2006   . OTHER SURGICAL HISTORY     carotid surgery on right 2004   . TOTAL KNEE ARTHROPLASTY  04/29/2011   Procedure: TOTAL KNEE ARTHROPLASTY;  Surgeon: Dione Plover [name];  Location: [facility];  Service: Orthopedics;  Laterality: Right;  . urinary retention    . VASCULAR SURGERY     AAA  . VASECTOMY  1973  . VIDEO BRONCHOSCOPY WITH ENDOBRONCHIAL ULTRASOUND [name] 09/09/2016   Procedure: VIDEO BRONCHOSCOPY WITH ENDOBRONCHIAL ULTRASOUND;  Surgeon: Grace Isaac, MD;  Location: [facility];  Service: Thoracic;  Laterality: [name];    There were no vitals filed for this visit.  Subjective Assessment - 03/16/18 0941    Subjective   S: It hurts when you're working on it but it feels better after.     Currently in Pain?  No/denies         [facility] OT Assessment - 03/16/18 0909      Assessment   Medical Diagnosis  Left shoulder pain      Precautions   Precautions  None      ROM / Strength   AROM / PROM / Strength  AROM;PROM;Strength      AROM   Overall AROM Comments  Assessed seated. IR/er adducted    AROM Assessment Site  Shoulder    Right/Left Shoulder  Left    Left  Shoulder Flexion  125 Degrees   previous: same   Left Shoulder ABduction  110 Degrees   previous: 96   Left Shoulder Internal Rotation  90 Degrees   previous: same   Left Shoulder External Rotation  73 Degrees   previous: 62     PROM   Overall PROM Comments  Assessed supine. IR/er adducted    PROM Assessment Site  Shoulder    Right/Left Shoulder  Left    Left Shoulder Flexion  165 Degrees   previous: 158   Left Shoulder ABduction  180 Degrees   previous: 180   Left Shoulder Internal Rotation  90 Degrees   previous: same   Left Shoulder External Rotation  75 Degrees   previous: 70     Strength   Overall Strength Comments  Assessed seated. IR/er adducted.    Strength Assessment Site  Shoulder    Right/Left Shoulder  Left    Left Shoulder Flexion  4-/5   previous: 3/5   Left Shoulder ABduction  4/5   previous: 3-/5   Left Shoulder Internal Rotation  4/5   previous: same   Left Shoulder External Rotation  5/5   previous: same              OT Treatments/Exercises (OP) - 03/16/18 0001      Exercises   Exercises  Shoulder      Shoulder Exercises: ROM/Strengthening   UBE (Upper Arm Bike)  level 1 3' reverse only    Wall Wash  1'    Ball on [place]  using washclothl 1' flexion, 1' abduction (small circles)      Functional Reaching Activities   High Level  Functional reaching task completed while standing and placing 10 cones into highest shelf focusing on functional reaching during shoulder flexion. No compensatory movements noted. Patient was able to complete with only one rest break after placing all cones on shelf, prior to bring them back down to counter.              OT Education - 03/16/18 [phone]    Education Details  reviewed progress in therapy and goals met. recommended finishing therapy at the end of next week with HEP.     Person(s) Educated  Patient    Methods  Explanation  Comprehension  Verbalized understanding       OT Short Term Goals -  03/16/18 0919      OT SHORT TERM GOAL #1   Title  Patient will be educated and independent with HEP to increase functional use and mobility of LUE as needed for daily tasks.     Time  3    Period  Weeks    Status  On-going      OT SHORT TERM GOAL #2   Title  Patient will report a decrease in pain level to approximately 4/10 when completing simple daily tasks using his LUE.     Baseline  9/27: Pt has no pain unless buckling his belt when completing internal rotation.     Time  3    Period  Weeks    Status  Achieved      OT SHORT TERM GOAL #3   Title  Patient will increase his LUE shoulder and scapular strength to 4/5 overall while presenting in increase posture and increase ability to complete lightweighting lifting tasks.     Time  3    Period  Weeks    Status  Partially Met      OT SHORT TERM GOAL #4   Title  Patient will increase his LUE A/ROM to [facility] to increase ability to complete all dressing tasks such as buckling his belt and pants with less difficulty.     Time  3    Period  Weeks    Status  Achieved               Plan - 03/16/18 6213    Clinical Impression Statement  A: Measurements taken for MD appointment. Patient has increased all ROM measurements except shoulder flexion which probably won't increase anymore due to his possible RTC tear. Strength and increased. patient reports that he only feels pain with certain movements (internal rotation when buckling his belt). Recommended that patient finish    Plan  P: Complete ball circles with large therapy ball to work on IR/er. Follow up on MD appointment. D/C passive stretching. No myofascial release needs. Continue to focus on functional reaching and strength. Attempt strengthening external rotation sidelying with weight or standing with theraband using towel roll.     Consulted and Agree with Plan of Care  Patient       Patient will benefit from skilled therapeutic intervention in order to improve the following  deficits and impairments:  Decreased range of motion, Pain, Impaired UE functional use, Decreased strength, Decreased mobility  Visit Diagnosis: Acute pain of left shoulder  Stiffness of left shoulder, not elsewhere classified  Other symptoms and signs involving the musculoskeletal system    Problem List Patient Active Problem List   Diagnosis Date Noted  . Dementia, neurological 02/14/2017  . Encounter for antineoplastic immunotherapy 01/02/2017  . Goals of care, counseling/discussion 01/02/2017  . Cerebrovascular accident (CVA) due to thrombosis of right carotid artery ([place]) 12/20/2016  . Cerebrovascular accident (CVA) due to embolism of cerebral artery ([place]) 12/20/2016  . Memory loss 12/20/2016  . Dehydration 11/15/2016  . Encounter for antineoplastic chemotherapy 10/17/2016  . Malnutrition of moderate degree 10/06/2016  . Urinary tract infection 10/03/2016  . T1b N2 M0 disease (stage IIIA).adenocarcinoma of the right middle lobe of lung ([place]) 09/20/2016  . Peripheral edema   . Slow transit constipation   . Acute lower UTI   . Confusion   . SOB (shortness of breath) 09/02/2016  . Urinary incontinence   .  Abnormal urinalysis   . Leukocytosis   . HCAP (healthcare-associated pneumonia)   . Hypoalbuminemia due to protein-calorie malnutrition ([place])   . Hypokalemia   . Hyponatremia   . Urinary retention   . Sleep disturbance   . Acute bilateral deep vein thrombosis (DVT) of popliteal veins ([facility])   . Brain mass 08/31/2016  . Debility   . Benign essential HTN   . Scrotal edema   . Hyperlipidemia   . Constipation due to pain medication   . Fall from height of greater than 3 feet   . Acute blood loss anemia   . Lung mass   . Post-operative pain   . PVD (peripheral vascular disease) ([place])   . Coronary artery disease involving coronary bypass graft of native heart without angina pectoris   . PAF (paroxysmal atrial fibrillation) ([place])   . Disorder of left sacroiliac joint  08/24/2016  . Fall 08/22/2016  . Closed pelvic ring fracture ([place])   . Carotid artery stenosis, asymptomatic 08/12/2014  . Ejection murmur 07/03/2014  . Aftercare following surgery of the circulatory system, [place] 02/11/2014  . Weakness-Abdomin and Bilateral Thigh / Leg 02/11/2014  . Carotid stenosis 07/19/2013  . Occlusion and stenosis of carotid artery without mention of cerebral infarction 07/16/2013  . Impaired fasting glucose 07/14/2013  . Knee pain 05/23/2011  . Stiffness of joint, not elsewhere classified, lower leg 05/23/2011  . Muscle weakness (generalized) 05/23/2011  . Difficulty in walking(719.7) 05/23/2011  . Bruit 11/19/2010  . AAA (abdominal aortic aneurysm) ([place]) 11/19/2010  . OTHER DYSPHAGIA 04/27/2010  . ATRIAL FIBRILLATION 12/10/2008  . JOINT EFFUSION, KNEE 01/14/2008  . KNEE PAIN 01/14/2008  . KNEE, ARTHRITIS, DEGEN./OSTEO 12/11/2007  . Hypothyroidism 10/10/2007  . Pure hypercholesterolemia 10/10/2007  . Essential hypertension 10/10/2007  . Coronary atherosclerosis 10/10/2007  . Peripheral vascular disease ([place]) 10/10/2007  . CHRONIC OBSTRUCTIVE PULMONARY DISEASE 10/10/2007  . GERD 10/10/2007  . DIVERTICULOSIS OF COLON 10/10/2007  . OSTEOPOROSIS 10/10/2007  . NEPHROLITHIASIS, HX OF 10/10/2007  . Personal history of surgery to heart and great vessels, presenting hazards to health 10/10/2007  . CHOLECYSTECTOMY, HX OF 10/10/2007  . CORONARY ARTERY BYPASS GRAFT, HX OF 10/10/2007   Ailene Ravel, OTR/L,CBIS  [phone]  03/16/2018, 10:37 AM  [place] [address] [place], [place], [address] Phone: [phone]   Fax:  [phone]  Name: [name] [name] MRN: [id number] Date of Birth: 01/24/1941

## 2018-03-16 NOTE — Patient Instructions (Signed)
Elbow Bursitis Elbow bursitis is inflammation of the fluid-filled sac (bursa) between the tip of your elbow bone (olecranon) and your skin. Elbow bursitis may also be called olecranon bursitis. Normally, the olecranon bursa has only a small amount of fluid in it to cushion and protect your elbow bone. Elbow bursitis causes fluid to build up inside the bursa. Over time, this swelling and inflammation can cause pain when you bend or lean on your elbow. What are the causes? Elbow bursitis may be caused by:  Elbow injury (acute trauma).  Leaning on hard surfaces for long periods of time.  Infection from an injury that breaks the skin near your elbow.  A bone growth (spur) that forms at the tip of your elbow.  A medical condition that causes inflammation in your body, such as gout or rheumatoid arthritis.  The cause may also be unknown. What are the signs or symptoms? The first sign of elbow bursitis is usually swelling over the tip of your elbow. This can grow to be the size of a golf ball. This may start suddenly or develop gradually. You may also have:  Pain when bending or leaning on your elbow.  Restricted movement of your elbow.  If your bursitis is caused by an infection, symptoms may also include:  Redness, warmth, and tenderness of the elbow.  Drainage of pus from the swollen area over your elbow, if the skin breaks open.  How is this diagnosed? Your health care provider may be able to diagnose elbow bursitis based on your signs and symptoms, especially if you have recently been injured. Your health care provider will also do a physical exam. This may include:  X-rays to look for a bone spur or a bone fracture.  Draining fluid from the bursa to test it for infection.  Blood tests to rule out gout or rheumatoid arthritis.  How is this treated? Treatment for elbow bursitis depends on the cause. Treatment may include:  Medicines. These may include: ? Over-the-counter  medicines to relieve pain and inflammation. ? Antibiotic medicines to fight infection. ? Injections of anti-inflammatory medicines (steroids).  Wrapping your elbow with a bandage.  Draining fluid from the bursa.  Wearing elbow pads.  If your bursitis does not get better with treatment, surgery may be needed to remove the bursa. Follow these instructions at home:  Take medicines only as directed by your health care provider.  If you were prescribed an antibiotic medicine, finish all of it even if you start to feel better.  If your bursitis is caused by an injury, rest your elbow and wear your bandage as directed by your health care provider. You may alsoapply ice to the injured area as directed by your health care provider: ? Put ice in a plastic bag. ? Place a towel between your skin and the bag. ? Leave the ice on for 20 minutes, 2-3 times per day.  Avoid any activities that cause elbow pain.  Use elbow pads or elbow wraps to cushion your elbow. Contact a health care provider if:  You have a fever.  Your symptoms do not get better with treatment.  Your pain or swelling gets worse.  Your elbow pain or swelling goes away and then returns.  You have drainage of pus from the swollen area over your elbow. This information is not intended to replace advice given to you by your health care provider. Make sure you discuss any questions you have with your health care provider. Document Released:  07/06/2006 Document Revised: 11/12/2015 Document Reviewed: 02/12/2014 Elsevier Interactive Patient Education  [employer].

## 2018-03-19 ENCOUNTER — Ambulatory Visit (HOSPITAL_COMMUNITY): Payer: Medicare Other | Admitting: Specialist

## 2018-03-19 ENCOUNTER — Encounter (HOSPITAL_COMMUNITY): Payer: Self-pay | Admitting: Specialist

## 2018-03-19 DIAGNOSIS — M25512 Pain in left shoulder: Secondary | ICD-10-CM | POA: Diagnosis not present

## 2018-03-19 DIAGNOSIS — M25612 Stiffness of left shoulder, not elsewhere classified: Secondary | ICD-10-CM | POA: Diagnosis not present

## 2018-03-19 DIAGNOSIS — R29898 Other symptoms and signs involving the musculoskeletal system: Secondary | ICD-10-CM | POA: Diagnosis not present

## 2018-03-19 NOTE — Therapy (Signed)
[place], [place], [address] Phone: [phone]   Fax:  [phone]  Occupational Therapy Treatment  Patient Details  Name: [name] [name] MRN: [id number] Date of Birth: 06/01/1941 No data recorded  Encounter Date: 03/19/2018  OT End of Session - 03/19/18 0952    Visit Number  9    Number of Visits  9    Date for OT Re-Evaluation  03/21/18    Authorization Type  1) Medicare 2) BSBC    OT Start Time  0906    OT Stop Time  0946    OT Time Calculation (min)  40 min    Activity Tolerance  Patient tolerated treatment well    Behavior During Therapy  [facility] for tasks assessed/performed       Past Medical History:  Diagnosis Date  . Adenocarcinoma of right lung, stage 3 ([place]) 09/20/2016  . Atrial fibrillation ([place])    no hx of reported at preop visit of 04/21/11   . CAD (coronary artery disease)   . Carotid artery occlusion    left carotid endarterectomy  . Chronic kidney disease    AAA repair with reimplant of renals   . CHRONIC OBSTRUCTIVE PULMONARY DISEASE    pt denied at visit of 04/21/11   . CORONARY ARTERY DISEASE   . Dehydration 11/15/2016  . Disorder of left sacroiliac joint 08/24/2016  . DIVERTICULOSIS OF COLON   . Encounter for antineoplastic chemotherapy 10/17/2016  . GERD   . H/O hiatal hernia   . Headache(784.0)    Hx: of years of years  . HYPERTENSION   . HYPOTHYROIDISM   . JOINT EFFUSION, KNEE   . KNEE, ARTHRITIS, DEGEN./OSTEO    right knee   . Myocardial infarction ([place])    1994   . NEPHROLITHIASIS, HX OF   . OSTEOPOROSIS   . Other dysphagia   . PERIPHERAL VASCULAR DISEASE    AAA - 1994 with reimplant of renals   . Peripheral vascular disease ([place])    subclavian stenosis PTA - 3/08   . Pure hypercholesterolemia   . Renal artery stenosis [facility])     Past Surgical History:  Procedure Laterality Date  . ABDOMINAL AORTIC ANEURYSM REPAIR     with reimplantation of renals   . CARDIAC  CATHETERIZATION     2008  . CAROTID ENDARTERECTOMY Left Jan. 30, 2015   CEA  . CHOLECYSTECTOMY  2000   Gall Bladder  . COLONOSCOPY W/ BIOPSIES AND POLYPECTOMY     Hx: of  . CORONARY ARTERY BYPASS GRAFT     1995  . CORONARY STENT PLACEMENT     Hx: of  . ENDARTERECTOMY Left 07/19/2013   Procedure: ENDARTERECTOMY CAROTID;  Surgeon: Mal Misty, MD;  Location: [facility];  Service: Vascular;  Laterality: Left;  . GALLBLADDER SURGERY  2004   . JOINT REPLACEMENT     partial knee replacement on left 2002   . KNEE SURGERY    . ORIF PELVIC FRACTURE Left 08/25/2016   Procedure: OPEN REDUCTION INTERNAL FIXATION (ORIF) PELVIC FRACTURE; plate on front, SI screw on the back;  Surgeon: Altamese [place], MD;  Location: [facility];  Service: Orthopedics;  Laterality: Left;  . OTHER SURGICAL HISTORY     left subclavian stenosis surgery PTA 08/2006   . OTHER SURGICAL HISTORY     carotid surgery on right 2004   . TOTAL KNEE ARTHROPLASTY  04/29/2011   Procedure: TOTAL KNEE ARTHROPLASTY;  Surgeon: Pilar Plate  V [name];  Location: [facility];  Service: Orthopedics;  Laterality: Right;  . urinary retention    . VASCULAR SURGERY     AAA  . VASECTOMY  1973  . VIDEO BRONCHOSCOPY WITH ENDOBRONCHIAL ULTRASOUND [name] 09/09/2016   Procedure: VIDEO BRONCHOSCOPY WITH ENDOBRONCHIAL ULTRASOUND;  Surgeon: Grace Isaac, MD;  Location: [facility];  Service: Thoracic;  Laterality: [name];    There were no vitals filed for this visit.  Subjective Assessment - 03/19/18 0951    Subjective   S:  I had fluid drawn off my elbow on Friday and it all has come back already.  I can do almost everything I want, I really just want to focus on shoulder strengthening the last few visits.     Currently in Pain?  No/denies                   OT Treatments/Exercises (OP) - 03/19/18 0001      Exercises   Exercises  Shoulder      Shoulder Exercises: Sidelying   External Rotation  Strengthening;10 reps    External Rotation Weight (lbs)  2     External Rotation Limitations  moderate tactile cues for positioning    Internal Rotation  Strengthening;10 reps    Internal Rotation Weight (lbs)  2    Flexion  10 reps;Strengthening    Flexion Weight (lbs)  2    Flexion Limitations  max tactile cues for technique and positioning     ABduction  Strengthening;10 reps    ABduction Weight (lbs)  2    ABduction Limitations  max tactile cues for positioning    Other Sidelying Exercises  horizontal abduction and protraction with 2# 10 times with max verbal guidance and tactile cues for positioning.       Shoulder Exercises: Standing   Other Standing Exercises  therapy ball for overhead press, chest press, flexion 10 times each with max difficulty, fatiguing by end of set and not completing full range       Shoulder Exercises: ROM/Strengthening   UBE (Upper Arm Bike)  level 2 3' in reverse    X to V Arms  10 times with moderate verbal guidance for technique 2#    Proximal Shoulder Strengthening, Seated  10 times with 2#     Other ROM/Strengthening Exercises  pinch tree with 1# wrist weight, able to reach to 7" from top of vertical pole             OT Education - 03/19/18 0951    Education Details  recommended an elbow compression sleeve for decreasing fluid in elbow region    Person(s) Educated  Patient    Methods  Explanation    Comprehension  Verbalized understanding       OT Short Term Goals - 03/16/18 0919      OT SHORT TERM GOAL #1   Title  Patient will be educated and independent with HEP to increase functional use and mobility of LUE as needed for daily tasks.     Time  3    Period  Weeks    Status  On-going      OT SHORT TERM GOAL #2   Title  Patient will report a decrease in pain level to approximately 4/10 when completing simple daily tasks using his LUE.     Baseline  9/27: Pt has no pain unless buckling his belt when completing internal rotation.     Time  3  Period  Weeks    Status  Achieved      OT SHORT  TERM GOAL #3   Title  Patient will increase his LUE shoulder and scapular strength to 4/5 overall while presenting in increase posture and increase ability to complete lightweighting lifting tasks.     Time  3    Period  Weeks    Status  Partially Met      OT SHORT TERM GOAL #4   Title  Patient will increase his LUE A/ROM to [facility] to increase ability to complete all dressing tasks such as buckling his belt and pants with less difficulty.     Time  3    Period  Weeks    Status  Achieved               Plan - 03/19/18 0947    Clinical Impression Statement  A:  Patient requires moderate verbal guidance for technique with most exercises, fatigues by end of 10 repetition set with all strengthening exercises.      Plan  P:  Complete ball circles, update HEP with strengthening exercises, DC at end of next session as certification period expires and patient will be ready to transiiton to a HEP.       Patient will benefit from skilled therapeutic intervention in order to improve the following deficits and impairments:  Decreased range of motion, Pain, Impaired UE functional use, Decreased strength, Decreased mobility  Visit Diagnosis: Acute pain of left shoulder  Stiffness of left shoulder, not elsewhere classified  Other symptoms and signs involving the musculoskeletal system    Problem List Patient Active Problem List   Diagnosis Date Noted  . Dementia, neurological 02/14/2017  . Encounter for antineoplastic immunotherapy 01/02/2017  . Goals of care, counseling/discussion 01/02/2017  . Cerebrovascular accident (CVA) due to thrombosis of right carotid artery ([place]) 12/20/2016  . Cerebrovascular accident (CVA) due to embolism of cerebral artery ([place]) 12/20/2016  . Memory loss 12/20/2016  . Dehydration 11/15/2016  . Encounter for antineoplastic chemotherapy 10/17/2016  . Malnutrition of moderate degree 10/06/2016  . Urinary tract infection 10/03/2016  . T1b N2 M0 disease (stage  IIIA).adenocarcinoma of the right middle lobe of lung ([place]) 09/20/2016  . Peripheral edema   . Slow transit constipation   . Acute lower UTI   . Confusion   . SOB (shortness of breath) 09/02/2016  . Urinary incontinence   . Abnormal urinalysis   . Leukocytosis   . HCAP (healthcare-associated pneumonia)   . Hypoalbuminemia due to protein-calorie malnutrition ([place])   . Hypokalemia   . Hyponatremia   . Urinary retention   . Sleep disturbance   . Acute bilateral deep vein thrombosis (DVT) of popliteal veins (H[facility])   . Brain mass 08/31/2016  . Debility   . Benign essential HTN   . Scrotal edema   . Hyperlipidemia   . Constipation due to pain medication   . Fall from height of greater than 3 feet   . Acute blood loss anemia   . Lung mass   . Post-operative pain   . PVD (peripheral vascular disease) ([place])   . Coronary artery disease involving coronary bypass graft of native heart without angina pectoris   . PAF (paroxysmal atrial fibrillation) ([place])   . Disorder of left sacroiliac joint 08/24/2016  . Fall 08/22/2016  . Closed pelvic ring fracture ([place])   . Carotid artery stenosis, asymptomatic 08/12/2014  . Ejection murmur 07/03/2014  . Aftercare following surgery of the  circulatory system, NEC 02/11/2014  . Weakness-Abdomin and Bilateral Thigh / Leg 02/11/2014  . Carotid stenosis 07/19/2013  . Occlusion and stenosis of carotid artery without mention of cerebral infarction 07/16/2013  . Impaired fasting glucose 07/14/2013  . Knee pain 05/23/2011  . Stiffness of joint, not elsewhere classified, lower leg 05/23/2011  . Muscle weakness (generalized) 05/23/2011  . Difficulty in walking(719.7) 05/23/2011  . Bruit 11/19/2010  . AAA (abdominal aortic aneurysm) ([place]) 11/19/2010  . OTHER DYSPHAGIA 04/27/2010  . ATRIAL FIBRILLATION 12/10/2008  . JOINT EFFUSION, KNEE 01/14/2008  . KNEE PAIN 01/14/2008  . KNEE, ARTHRITIS, DEGEN./OSTEO 12/11/2007  . Hypothyroidism 10/10/2007  . Pure  hypercholesterolemia 10/10/2007  . Essential hypertension 10/10/2007  . Coronary atherosclerosis 10/10/2007  . Peripheral vascular disease ([place]) 10/10/2007  . CHRONIC OBSTRUCTIVE PULMONARY DISEASE 10/10/2007  . GERD 10/10/2007  . DIVERTICULOSIS OF COLON 10/10/2007  . OSTEOPOROSIS 10/10/2007  . NEPHROLITHIASIS, HX OF 10/10/2007  . Personal history of surgery to heart and great vessels, presenting hazards to health 10/10/2007  . CHOLECYSTECTOMY, HX OF 10/10/2007  . CORONARY ARTERY BYPASS GRAFT, HX OF 10/10/2007    Vangie Bicker, [place], OTR/L [phone]  03/19/2018, 9:54 AM  [place], [place], [address] Phone: [phone]   Fax:  [phone]  Name: [name] [name] MRN: [id number] Date of Birth: [birth year]

## 2018-03-20 ENCOUNTER — Encounter (HOSPITAL_COMMUNITY): Payer: Medicare Other

## 2018-03-20 ENCOUNTER — Ambulatory Visit (INDEPENDENT_AMBULATORY_CARE_PROVIDER_SITE_OTHER): Payer: Medicare Other | Admitting: Vascular Surgery

## 2018-03-20 ENCOUNTER — Ambulatory Visit: Payer: Medicare Other | Admitting: Vascular Surgery

## 2018-03-20 ENCOUNTER — Encounter: Payer: Self-pay | Admitting: Vascular Surgery

## 2018-03-20 ENCOUNTER — Other Ambulatory Visit: Payer: Self-pay

## 2018-03-20 ENCOUNTER — Ambulatory Visit (HOSPITAL_COMMUNITY)
Admission: RE | Admit: 2018-03-20 | Discharge: 2018-03-20 | Disposition: A | Discharge status: Home / Self Care | Payer: Medicare Other | Source: Ambulatory Visit | Attending: Vascular Surgery | Admitting: Vascular Surgery

## 2018-03-20 VITALS — BP 135/70 | HR 65 | Temp 96.9°F | Resp 18 | Ht 67.0 in | Wt 128.0 lb

## 2018-03-20 DIAGNOSIS — Z87891 Personal history of nicotine dependence: Secondary | ICD-10-CM | POA: Diagnosis not present

## 2018-03-20 DIAGNOSIS — I6523 Occlusion and stenosis of bilateral carotid arteries: Secondary | ICD-10-CM

## 2018-03-20 DIAGNOSIS — I2510 Atherosclerotic heart disease of native coronary artery without angina pectoris: Secondary | ICD-10-CM | POA: Insufficient documentation

## 2018-03-20 DIAGNOSIS — I10 Essential (primary) hypertension: Secondary | ICD-10-CM | POA: Insufficient documentation

## 2018-03-20 DIAGNOSIS — Z8673 Personal history of transient ischemic attack (TIA), and cerebral infarction without residual deficits: Secondary | ICD-10-CM | POA: Diagnosis not present

## 2018-03-20 DIAGNOSIS — I6529 Occlusion and stenosis of unspecified carotid artery: Secondary | ICD-10-CM | POA: Diagnosis not present

## 2018-03-20 DIAGNOSIS — E785 Hyperlipidemia, unspecified: Secondary | ICD-10-CM | POA: Insufficient documentation

## 2018-03-20 NOTE — Progress Notes (Signed)
Vascular and Vein Specialist of [facility]  Patient name: [name] [name] MRN: [id number] DOB: [birth year] Sex: male  REASON FOR VISIT: Follow-up extracranial cerebral vascular occlusive disease  HPI: [name] [name] is a 77 y.o. male here today for follow-up.  He is here with his wife.  He is status post right carotid endarterectomy in 2005 and left carotid endarterectomy in 2015 both by Dr. Kellie Simmering.  He is a known asymptomatic right carotid recurrent stenosis and is been in the 60 to 79% range.  He is here today for six-month duplex follow-up.  He has had no neurologic deficits.  He did suffer a left shoulder injury which is improving.  He does have known stage III lung cancer and this is been responsive to immunotherapy.  He has a repeat CT scan in November.  He specifically denies any episodes of amaurosis fugax, transient ischemic attack or stroke  Past Medical History:  Diagnosis Date  . Adenocarcinoma of right lung, stage 3 ([place]) 09/20/2016  . Atrial fibrillation ([place])    no hx of reported at preop visit of 04/21/11   . CAD (coronary artery disease)   . Carotid artery occlusion    left carotid endarterectomy  . Chronic kidney disease    AAA repair with reimplant of renals   . CHRONIC OBSTRUCTIVE PULMONARY DISEASE    pt denied at visit of 04/21/11   . CORONARY ARTERY DISEASE   . Dehydration 11/15/2016  . Disorder of left sacroiliac joint 08/24/2016  . DIVERTICULOSIS OF COLON   . Encounter for antineoplastic chemotherapy 10/17/2016  . GERD   . H/O hiatal hernia   . Headache(784.0)    Hx: of years of years  . HYPERTENSION   . HYPOTHYROIDISM   . JOINT EFFUSION, KNEE   . KNEE, ARTHRITIS, DEGEN./OSTEO    right knee   . Myocardial infarction ([place])    1994   . NEPHROLITHIASIS, HX OF   . OSTEOPOROSIS   . Other dysphagia   . PERIPHERAL VASCULAR DISEASE    AAA - 1994 with reimplant of renals   . Peripheral vascular disease ([place])    subclavian  stenosis PTA - 3/08   . Pure hypercholesterolemia   . Renal artery stenosis ([facility])     Family History  Problem Relation Age of Onset  . Hypertension Mother   . Stroke Mother   . Heart disease Father   . Heart attack Father   . Diabetes Sister   . Cancer Brother 53       Lung  . Diabetes Sister     SOCIAL HISTORY: Social History   Tobacco Use  . Smoking status: Former Smoker    Types: Cigarettes    Last attempt to quit: 06/20/1992    Years since quitting: 25.7  . Smokeless tobacco: Never Used  Substance Use Topics  . Alcohol use: No    Alcohol/week: 0.0 standard drinks    Allergies  Allergen Reactions  . Neurontin [Gabapentin] Other (See Comments)    Caused confusion  . Imitrex [Sumatriptan] Nausea And Vomiting and Other (See Comments)    "made me like I was having a stoke"  . Lipitor [Atorvastatin] Nausea And Vomiting and Other (See Comments)    INTOLERANCE > MYALGIAS "couldn't get out of the bed ( pt had to crawl out of bed ) I hurt so bad"    Current Outpatient Medications  Medication Sig Dispense Refill  . acetaminophen (TYLENOL) 325 MG tablet Take 325-650 mg by mouth  every 6 (six) hours as needed for headache (for pain).     . bumetanide (BUMEX) 1 MG tablet Take 1 tablet (1 mg total) by mouth daily as needed (swelling). 30 tablet 1  . donepezil (ARICEPT) 5 MG tablet Take 1 tablet (5 mg total) by mouth at bedtime. 90 tablet 1  . lansoprazole (PREVACID) 15 MG capsule Take 1 capsule (15 mg total) by mouth daily. 30 capsule 5  . levothyroxine (SYNTHROID, LEVOTHROID) 112 MCG tablet Take 1 tablet (112 mcg total) by mouth daily. 90 tablet 1  . metoprolol tartrate (LOPRESSOR) 25 MG tablet TAKE ONE-HALF TABLET BY MOUTH TWO TIMES DAILY 90 tablet 0  . niacin (SLO-NIACIN) 500 MG tablet Take 3 tablets (1,500 mg total) by mouth at bedtime. 30 tablet 0  . nitroGLYCERIN (NITROSTAT) 0.4 MG SL tablet Place 1 tablet (0.4 mg total) under the tongue every 5 (five) minutes as needed for  chest pain. 25 tablet 10  . potassium chloride SA (K-DUR,KLOR-CON) 20 MEQ tablet Take 1 tablet (20 mEq total) by mouth daily. 90 tablet 0  . rosuvastatin (CRESTOR) 20 MG tablet Take 1 tablet (20 mg total) by mouth daily. 90 tablet 1  . tamsulosin (FLOMAX) 0.4 MG CAPS capsule TAKE ONE CAPSULE BY MOUTH TWICE A DAY. 60 capsule 11  . warfarin (COUMADIN) 5 MG tablet TAKE 1 TABLET DAILY. TAKE AS DIRECTED BYPHYSICIAN 90 tablet 1  . ALPRAZolam (XANAX) 0.25 MG tablet Take 1 tablet (0.25 mg total) by mouth 3 (three) times daily as needed for anxiety. (Patient not taking: Reported on 03/20/2018) 30 tablet 0  . linaclotide (LINZESS) 145 MCG CAPS capsule Take 1 capsule (145 mcg total) by mouth daily before breakfast. (Patient not taking: Reported on 03/20/2018) 30 capsule 5  . magic mouthwash w/lidocaine SOLN Composition: 80 cc each of Extra St. Maalox Plus, Diphenhydramine, and Nystatin. Plus 240 cc of 2% viscous lidocaine to make 480 cc. Swish and swallow 2 tsp four times per day. (Patient not taking: Reported on 03/20/2018) 480 mL 2  . sucralfate (CARAFATE) 1 GM/10ML suspension Take 10 mLs (1 g total) by mouth 4 (four) times daily -  with meals and at bedtime. (Patient not taking: Reported on 03/20/2018) 420 mL 1   No current facility-administered medications for this visit.     REVIEW OF SYSTEMS:  [X]  denotes positive finding, [ ]  denotes negative finding Cardiac  Comments:  Chest pain or chest pressure:    Shortness of breath upon exertion:    Short of breath when lying flat:    Irregular heart rhythm:        Vascular    Pain in calf, thigh, or hip brought on by ambulation:    Pain in feet at night that wakes you up from your sleep:     Blood clot in your veins:    Leg swelling:           PHYSICAL EXAM: Vitals:   03/20/18 1041 03/20/18 1044  BP: 132/79 135/70  Pulse: 67 65  Resp: 18   Temp: (!) 96.9 F (36.1 C)   TempSrc: Oral   SpO2: 97%   Weight: 128 lb (58.1 kg)   Height: 5\' 7"  (1.702  m)     GENERAL: The patient is a well-nourished male, in no acute distress. The vital signs are documented above. CARDIOVASCULAR: Harsh right carotid bruit and soft left carotid bruit PULMONARY: There is good air exchange  MUSCULOSKELETAL: There are no major deformities or cyanosis. NEUROLOGIC: No focal  weakness or paresthesias are detected. SKIN: There are no ulcers or rashes noted. PSYCHIATRIC: The patient has a normal affect.  DATA:  Duplex today reveals 60 to 79% stenosis in the recurrent internal carotid artery stenosis on the right.  No evidence of internal carotid artery stenosis on the left.  Does have some renarrowing of the proximal common carotid artery  MEDICAL ISSUES: Discussed this at length with the patient and his wife.  I recommend continued six-month surveillance of this.  Would recommend intervention should he develop any symptoms or if he developed more severe narrowing.  We will see him again in 6 months with repeat duplex    Rosetta Posner, MD [facility] Vascular and Vein Specialists of [facility] Tel [phone] Pager [phone]

## 2018-03-21 ENCOUNTER — Encounter (HOSPITAL_COMMUNITY): Payer: Self-pay

## 2018-03-21 ENCOUNTER — Ambulatory Visit (HOSPITAL_COMMUNITY): Payer: Medicare Other | Attending: Orthopedic Surgery

## 2018-03-21 DIAGNOSIS — M25512 Pain in left shoulder: Secondary | ICD-10-CM | POA: Insufficient documentation

## 2018-03-21 DIAGNOSIS — R29898 Other symptoms and signs involving the musculoskeletal system: Secondary | ICD-10-CM | POA: Diagnosis not present

## 2018-03-21 DIAGNOSIS — M25612 Stiffness of left shoulder, not elsewhere classified: Secondary | ICD-10-CM | POA: Insufficient documentation

## 2018-03-21 NOTE — Patient Instructions (Signed)
FREE WEIGHT - BILATERAL SCAPTION  Hold a free weight in both hands and then raise them both up away from your side in a forward/lateral direction. Your elbows should be straight and the movement should occur in the plane of the scapula or 45 degrees to the side as shown.  Do not let your shoulder shrug upwards unless instructed to go over shoulder level height.       FREE WEIGHT FLEXION IN NEUTRAL ROTATION  Start with your arms down by your side. While holding a free weight with your palm facing your side and your elbows straight raise up your arm forward as shown then return to starting position.     Chest Press  Sit in a chair with your head up and back straight. Start with your elbows bent holding the weights at your chest. Push the weights straight out in front of you until your arms are straight. Pull the weights back slowly to the starting position.    Shoulder external rotation with a free weight  Start by holding your arm at your side with your elbow bent to 90 degrees, a rolled up towel tucked under your arm, and a weight in your hand.  Next, rotate your arm away from your body and pinch your shoulder blades together.   Keep your elbow at your side the entire time.      Shoulder Horizontal Abduction  Start with with arms out front and palms together. Keeping arms parallel with floor, open arms out to side and then return.

## 2018-03-21 NOTE — Therapy (Signed)
[place], [place], [address] Phone: [phone]   Fax:  [phone]  Occupational Therapy Treatment And discharge summary Patient Details  Name: [name] [name] MRN: [id number] Date of Birth: [birth year] Referring Provider (OT): Dr. Arther Abbott   Encounter Date: 03/21/2018  OT End of Session - 03/21/18 0939    Visit Number  10    Number of Visits  10    Authorization Type  1) Medicare 2) BSBC    OT Start Time  0900    OT Stop Time  0945    OT Time Calculation (min)  45 min    Activity Tolerance  Patient tolerated treatment well    Behavior During Therapy  [facility] for tasks assessed/performed       Past Medical History:  Diagnosis Date  . Adenocarcinoma of right lung, stage 3 ([place]) 09/20/2016  . Atrial fibrillation ([place])    no hx of reported at preop visit of 04/21/11   . CAD (coronary artery disease)   . Carotid artery occlusion    left carotid endarterectomy  . Chronic kidney disease    AAA repair with reimplant of renals   . CHRONIC OBSTRUCTIVE PULMONARY DISEASE    pt denied at visit of 04/21/11   . CORONARY ARTERY DISEASE   . Dehydration 11/15/2016  . Disorder of left sacroiliac joint 08/24/2016  . DIVERTICULOSIS OF COLON   . Encounter for antineoplastic chemotherapy 10/17/2016  . GERD   . H/O hiatal hernia   . Headache(784.0)    Hx: of years of years  . HYPERTENSION   . HYPOTHYROIDISM   . JOINT EFFUSION, KNEE   . KNEE, ARTHRITIS, DEGEN./OSTEO    right knee   . Myocardial infarction ([place])    1994   . NEPHROLITHIASIS, HX OF   . OSTEOPOROSIS   . Other dysphagia   . PERIPHERAL VASCULAR DISEASE    AAA - 1994 with reimplant of renals   . Peripheral vascular disease ([place])    subclavian stenosis PTA - 3/08   . Pure hypercholesterolemia   . Renal artery stenosis [facility])     Past Surgical History:  Procedure Laterality Date  . ABDOMINAL AORTIC ANEURYSM REPAIR     with reimplantation of renals   .  CARDIAC CATHETERIZATION     2008  . CAROTID ENDARTERECTOMY Left Jan. 30, 2015   CEA  . CHOLECYSTECTOMY  2000   Gall Bladder  . COLONOSCOPY W/ BIOPSIES AND POLYPECTOMY     Hx: of  . CORONARY ARTERY BYPASS GRAFT     1995  . CORONARY STENT PLACEMENT     Hx: of  . ENDARTERECTOMY Left 07/19/2013   Procedure: ENDARTERECTOMY CAROTID;  Surgeon: Mal Misty, MD;  Location: [place];  Service: Vascular;  Laterality: Left;  . GALLBLADDER SURGERY  2004   . JOINT REPLACEMENT     partial knee replacement on left 2002   . KNEE SURGERY    . ORIF PELVIC FRACTURE Left 08/25/2016   Procedure: OPEN REDUCTION INTERNAL FIXATION (ORIF) PELVIC FRACTURE; plate on front, SI screw on the back;  Surgeon: Altamese Troup, MD;  Location: [place];  Service: Orthopedics;  Laterality: Left;  . OTHER SURGICAL HISTORY     left subclavian stenosis surgery PTA 08/2006   . OTHER SURGICAL HISTORY     carotid surgery on right 2004   . TOTAL KNEE ARTHROPLASTY  04/29/2011   Procedure: TOTAL KNEE ARTHROPLASTY;  Surgeon: Gearlean Alf;  Location: [facility];  Service: Orthopedics;  Laterality: Right;  . urinary retention    . VASCULAR SURGERY     AAA  . VASECTOMY  1973  . VIDEO BRONCHOSCOPY WITH ENDOBRONCHIAL ULTRASOUND [name] 09/09/2016   Procedure: VIDEO BRONCHOSCOPY WITH ENDOBRONCHIAL ULTRASOUND;  Surgeon: Grace Isaac, MD;  Location: [place];  Service: Thoracic;  Laterality: [name];    There were no vitals filed for this visit.  Subjective Assessment - 03/21/18 0932    Subjective   S: My shoulder has been feeling really good.     Special Tests  FOTO: 61/100    Currently in Pain?  No/denies         [facility] OT Assessment - 03/21/18 0932      Assessment   Medical Diagnosis  Left shoulder pain    Referring Provider (OT)  Dr. Arther Abbott      Precautions   Precautions  None               OT Treatments/Exercises (OP) - 03/21/18 0932      Exercises   Exercises  Shoulder      Shoulder Exercises: Seated    Protraction  Strengthening;12 reps    Protraction Weight (lbs)  1    Horizontal ABduction  Strengthening;12 reps    Horizontal ABduction Weight (lbs)  1    External Rotation  Strengthening;12 reps    External Rotation Weight (lbs)  1    Internal Rotation  Strengthening;12 reps    Internal Rotation Weight (lbs)  1    Flexion  Strengthening;12 reps;Limitations    Flexion Weight (lbs)  1    Flexion Limitations  only to 90 degrees    Abduction  Strengthening;12 reps;Limitations    ABduction Weight (lbs)  1    ABduction Limitations  only to 90 degrees      Shoulder Exercises: Therapy Ball   Right/Left  5 reps    Other Therapy Ball Exercises  small green therapy ball; 10X; flexion (to shoulder height), protraction, circles (left and right)             OT Education - 03/21/18 0939    Education Details  shoulder strengthening with lightweight. Reviewed HEP and discussed frequency (1X a day, 10-12 repetitions). Therapist applied Ace Wrap to left elbow for edema management. Pt was provided with verbal instructions on use, precauctions, wearing duration and frequency. Recommended patient purchase an elbow compression sleeve when able.     Person(s) Educated  Patient    Methods  Explanation;Demonstration;Verbal cues;Handout    Comprehension  Returned demonstration;Verbalized understanding       OT Short Term Goals - 03/21/18 0947      OT SHORT TERM GOAL #1   Title  Patient will be educated and independent with HEP to increase functional use and mobility of LUE as needed for daily tasks.     Time  3    Period  Weeks    Status  Achieved      OT SHORT TERM GOAL #2   Title  Patient will report a decrease in pain level to approximately 4/10 when completing simple daily tasks using his LUE.     Baseline  9/27: Pt has no pain unless buckling his belt when completing internal rotation.     Time  3    Period  Weeks      OT SHORT TERM GOAL #3   Title  Patient will increase his LUE  shoulder and scapular strength to  4/5 overall while presenting in increase posture and increase ability to complete lightweighting lifting tasks.     Time  3    Period  Weeks    Status  Partially Met      OT SHORT TERM GOAL #4   Title  Patient will increase his LUE A/ROM to [facility] to increase ability to complete all dressing tasks such as buckling his belt and pants with less difficulty.     Time  3    Period  Weeks               Plan - 03/21/18 5176    Clinical Impression Statement  A: Session focused on LUE strengthening while updating HEP. Reviewed HEP and all questions answered. Pt has met all therapy goals except his strength goal which he partially met. Patient reports that his shoulder feels much better than before and he has occassional pain when buckling his belt. Pt is agreeable to discharge.     Plan  P: D/C from OT services with HEP. Patient to follow up as needed.     Consulted and Agree with Plan of Care  Patient       Patient will benefit from skilled therapeutic intervention in order to improve the following deficits and impairments:  Decreased range of motion, Pain, Impaired UE functional use, Decreased strength, Decreased mobility  Visit Diagnosis: Acute pain of left shoulder  Stiffness of left shoulder, not elsewhere classified  Other symptoms and signs involving the musculoskeletal system    Problem List Patient Active Problem List   Diagnosis Date Noted  . Dementia, neurological ([place]) 02/14/2017  . Encounter for antineoplastic immunotherapy 01/02/2017  . Goals of care, counseling/discussion 01/02/2017  . Cerebrovascular accident (CVA) due to thrombosis of right carotid artery ([place]) 12/20/2016  . Cerebrovascular accident (CVA) due to embolism of cerebral artery ([place]) 12/20/2016  . Memory loss 12/20/2016  . Dehydration 11/15/2016  . Encounter for antineoplastic chemotherapy 10/17/2016  . Malnutrition of moderate degree 10/06/2016  . Urinary tract  infection 10/03/2016  . T1b N2 M0 disease (stage IIIA).adenocarcinoma of the right middle lobe of lung ([place]) 09/20/2016  . Peripheral edema   . Slow transit constipation   . Acute lower UTI   . Confusion   . SOB (shortness of breath) 09/02/2016  . Urinary incontinence   . Abnormal urinalysis   . Leukocytosis   . HCAP (healthcare-associated pneumonia)   . Hypoalbuminemia due to protein-calorie malnutrition ([place])   . Hypokalemia   . Hyponatremia   . Urinary retention   . Sleep disturbance   . Acute bilateral deep vein thrombosis (DVT) of popliteal veins ([facility])   . Brain mass 08/31/2016  . Debility   . Benign essential HTN   . Scrotal edema   . Hyperlipidemia   . Constipation due to pain medication   . Fall from height of greater than 3 feet   . Acute blood loss anemia   . Lung mass   . Post-operative pain   . PVD (peripheral vascular disease) ([place])   . Coronary artery disease involving coronary bypass graft of native heart without angina pectoris   . PAF (paroxysmal atrial fibrillation) ([place])   . Disorder of left sacroiliac joint 08/24/2016  . Fall 08/22/2016  . Closed pelvic ring fracture ([place])   . Carotid artery stenosis, asymptomatic 08/12/2014  . Ejection murmur 07/03/2014  . Aftercare following surgery of the circulatory system, [place] 02/11/2014  . Weakness-Abdomin and Bilateral Thigh / Leg 02/11/2014  .  Carotid stenosis 07/19/2013  . Occlusion and stenosis of carotid artery without mention of cerebral infarction 07/16/2013  . Impaired fasting glucose 07/14/2013  . Knee pain 05/23/2011  . Stiffness of joint, not elsewhere classified, lower leg 05/23/2011  . Muscle weakness (generalized) 05/23/2011  . Difficulty in walking(719.7) 05/23/2011  . Bruit 11/19/2010  . AAA (abdominal aortic aneurysm) ([place]) 11/19/2010  . OTHER DYSPHAGIA 04/27/2010  . ATRIAL FIBRILLATION 12/10/2008  . JOINT EFFUSION, KNEE 01/14/2008  . KNEE PAIN 01/14/2008  . KNEE, ARTHRITIS, DEGEN./OSTEO  12/11/2007  . Hypothyroidism 10/10/2007  . Pure hypercholesterolemia 10/10/2007  . Essential hypertension 10/10/2007  . Coronary atherosclerosis 10/10/2007  . Peripheral vascular disease ([place]) 10/10/2007  . CHRONIC OBSTRUCTIVE PULMONARY DISEASE 10/10/2007  . GERD 10/10/2007  . DIVERTICULOSIS OF COLON 10/10/2007  . OSTEOPOROSIS 10/10/2007  . NEPHROLITHIASIS, HX OF 10/10/2007  . Personal history of surgery to heart and great vessels, presenting hazards to health 10/10/2007  . CHOLECYSTECTOMY, HX OF 10/10/2007  . CORONARY ARTERY BYPASS GRAFT, HX OF 10/10/2007   Ailene Ravel, OTR/L,CBIS  [phone]  03/21/2018, 10:31 AM  [place] [address] [place], [place], [address] Phone: [phone]   Fax:  [phone]  Name: SEON GAERTNER MRN: [id number] Date of Birth: [birth year]

## 2018-03-23 ENCOUNTER — Encounter (HOSPITAL_COMMUNITY): Payer: Medicare Other

## 2018-03-29 ENCOUNTER — Ambulatory Visit (INDEPENDENT_AMBULATORY_CARE_PROVIDER_SITE_OTHER): Payer: Medicare Other | Admitting: *Deleted

## 2018-03-29 DIAGNOSIS — Z7901 Long term (current) use of anticoagulants: Secondary | ICD-10-CM | POA: Diagnosis not present

## 2018-03-29 LAB — POCT INR: INR: 2 (ref 2.0–3.0)

## 2018-03-29 NOTE — Patient Instructions (Signed)
coumadin 5mg  tablets. Take  1.5 tablets on Sunday, Tuesday and Friday and one tablet Monday, Wednesday, Thursday and Saturday. Recheck INR in 4 weeks

## 2018-03-30 ENCOUNTER — Ambulatory Visit (INDEPENDENT_AMBULATORY_CARE_PROVIDER_SITE_OTHER): Payer: Medicare Other | Admitting: Orthopedic Surgery

## 2018-03-30 VITALS — BP 111/50 | HR 58 | Ht 67.0 in | Wt 129.0 lb

## 2018-03-30 DIAGNOSIS — M7022 Olecranon bursitis, left elbow: Secondary | ICD-10-CM | POA: Diagnosis not present

## 2018-03-30 NOTE — Progress Notes (Signed)
Chief Complaint  Patient presents with  . Follow-up    Recheck on left elbow.    Encounter Diagnosis  Name Primary?  [name] Olecranon bursitis of left elbow Yes    Procedure aspiration left elbow  Patient gave consent  Site confirmed  Skin prep with alcohol 18-gauge needle used to aspirate left elbow bursa it was bloody fluid about 30 cc  Band-Aid and Ace wrap applied  Follow-up as needed

## 2018-04-03 ENCOUNTER — Ambulatory Visit (INDEPENDENT_AMBULATORY_CARE_PROVIDER_SITE_OTHER): Payer: Medicare Other | Admitting: Neurology

## 2018-04-03 ENCOUNTER — Other Ambulatory Visit: Payer: Self-pay

## 2018-04-03 ENCOUNTER — Encounter: Payer: Self-pay | Admitting: Neurology

## 2018-04-03 VITALS — BP 110/66 | HR 79 | Ht 67.0 in | Wt 130.0 lb

## 2018-04-03 DIAGNOSIS — F0390 Unspecified dementia without behavioral disturbance: Secondary | ICD-10-CM | POA: Diagnosis not present

## 2018-04-03 DIAGNOSIS — I6529 Occlusion and stenosis of unspecified carotid artery: Secondary | ICD-10-CM

## 2018-04-03 DIAGNOSIS — F03A0 Unspecified dementia, mild, without behavioral disturbance, psychotic disturbance, mood disturbance, and anxiety: Secondary | ICD-10-CM

## 2018-04-03 MED ORDER — DONEPEZIL HCL 10 MG PO TABS: ORAL_TABLET | ORAL | 3 refills | Status: AC

## 2018-04-03 NOTE — Progress Notes (Signed)
NEUROLOGY FOLLOW UP OFFICE NOTE  [name] [name] [id number] [birth year]  HISTORY OF PRESENT ILLNESS: I had the pleasure of seeing [name] [name] in follow-up in the neurology clinic on 04/03/2018.  He is again accompanied by his wife who helps supplement the history today. The patient was last seen 6 months ago for worsening memory after bilateral strokes. MMSE 23/30 in April 2019 (20/30 in July 2018). He is taking Aricept 5 mg daily without side effects. Since his last visit, his wife feels his memory is pretty good, once in a while a little worse. He does not drive. His wife manages medications and finances. He is independent with dressing and bathing. No paranoia or hallucinations. Sleep is good. He denies any headaches, dizziness, focal numbness/tingling/weakness, no falls. He has a lot of trouble seeing out of the right eye, and his wife feels his hearing is worse. He continues to follow-up with Vascular surgery for carotid stenosis.   HPI 12/20/2016: This is a pleasant 77 yo RH man with a history of CAD s/p CABG in 1995, abdominal aortic aneurysm, carotid stenosis s/p bilateral CEA, diverticulosis, atrial fibrillation, hypertension, hiatal hernia, hypothyroidism, osteoporosis, dyslipidemia, renal artery stenosis and long history of smoking but quit in 1994. He was admitted to [facility] last 08/22/2016 after a fall off a 20-feet ladder. He sustained a pelvic fracture and transverse process fractrues L2-S1 and underwent surgery. As part of his evaluation, he had a CT chest/abdomen/pelvis which showed pulmonary densities. Bronchoscopy was done with biopsy showing malignant cells consistent with adenocarcinoma. He developed a DVT and was started on Coumadin. His head CT without contrast showed abnormal anterior left frontal lobe with a suspicious for 2 cm brain mass with mild surrounding vasogenic edema. Follow-up brain MRI done 09/02/2016 showed a left frontal lesion with T1 shortening and susceptibility blooming  without enhancement, favoring subacute to chronic hemorrhagic infarct or cortical contusion. There were small foci of restricted diffusion in the right frontal and parietal lobes in a watershed distribution consistent with acute/early subacute infarcts. Sequelae of trauma with shear injury was also possible, but no hemorrhage was seen. There was a small left cerebral convexity subdural hematoma without mass effect. A repeat MRI brain with and without contrast done 10/15/16 showed new areas of acute ischemia in the right hemisphere not seen on previous scan, consistent with ongoing hypoperfusion and multifocal watershed infarcts. The changes on the left frontal region did not show features of metastatic disease, more consistent with evolution of hemorrhagic infarct. There was concern for proximal high-grade stenosis, consider CTA head and neck for further evaluation.  Since his hospital stay, he has finished chemotherapy and radiation therapy. He developed urinary retention and has a foley catheter with follow-up with Urology. He has not seen Neurology until today, and his wife reports confusion where he would not remember things he had always done, asking a lot of questions because he could not understand what she was trying to tell him. He could not figure out their remote control of 10 years, he states he has trouble seeing the little numbers. He had previously been in charge of bills, his wife took after after hospitalization. She manages his medications, he has always been on multiple medications in the past, but is having a hard time managing them now. He had been driving prior to the fall without getting lost, but is not driving at this time. His wife helps him to the shower but he can bathe and dress himself. She has noticed more  irritability, no paranoia. He was having "crazy things and imagining things" on gabapentin, resolved with discontinuation of medication. His mother and 3 sisters had Alzheimer's  disease. His mother had a history of stroke.  PAST MEDICAL HISTORY: Past Medical History:  Diagnosis Date  . Adenocarcinoma of right lung, stage 3 ([place]) 09/20/2016  . Atrial fibrillation ([place])    no hx of reported at preop visit of 04/21/11   . CAD (coronary artery disease)   . Carotid artery occlusion    left carotid endarterectomy  . Chronic kidney disease    AAA repair with reimplant of renals   . CHRONIC OBSTRUCTIVE PULMONARY DISEASE    pt denied at visit of 04/21/11   . CORONARY ARTERY DISEASE   . Dehydration 11/15/2016  . Disorder of left sacroiliac joint 08/24/2016  . DIVERTICULOSIS OF COLON   . Encounter for antineoplastic chemotherapy 10/17/2016  . GERD   . H/O hiatal hernia   . Headache(784.0)    Hx: of years of years  . HYPERTENSION   . HYPOTHYROIDISM   . JOINT EFFUSION, KNEE   . KNEE, ARTHRITIS, DEGEN./OSTEO    right knee   . Myocardial infarction ([place])    1994   . NEPHROLITHIASIS, HX OF   . OSTEOPOROSIS   . Other dysphagia   . PERIPHERAL VASCULAR DISEASE    AAA - 1994 with reimplant of renals   . Peripheral vascular disease ([place])    subclavian stenosis PTA - 3/08   . Pure hypercholesterolemia   . Renal artery stenosis [facility])     MEDICATIONS: Current Outpatient Medications on File Prior to Visit  Medication Sig Dispense Refill  . acetaminophen (TYLENOL) 325 MG tablet Take 325-650 mg by mouth every 6 (six) hours as needed for headache (for pain).     [name] ALPRAZolam (XANAX) 0.25 MG tablet Take 1 tablet (0.25 mg total) by mouth 3 (three) times daily as needed for anxiety. 30 tablet 0  . bumetanide (BUMEX) 1 MG tablet Take 1 tablet (1 mg total) by mouth daily as needed (swelling). 30 tablet 1  . donepezil (ARICEPT) 5 MG tablet Take 1 tablet (5 mg total) by mouth at bedtime. 90 tablet 1  . lansoprazole (PREVACID) 15 MG capsule Take 1 capsule (15 mg total) by mouth daily. 30 capsule 5  . levothyroxine (SYNTHROID, LEVOTHROID) 112 MCG tablet Take 1 tablet (112 mcg total) by  mouth daily. 90 tablet 1  . linaclotide (LINZESS) 145 MCG CAPS capsule Take 1 capsule (145 mcg total) by mouth daily before breakfast. 30 capsule 5  . magic mouthwash w/lidocaine SOLN Composition: 80 cc each of Extra St. Maalox Plus, Diphenhydramine, and Nystatin. Plus 240 cc of 2% viscous lidocaine to make 480 cc. Swish and swallow 2 tsp four times per day. 480 mL 2  . metoprolol tartrate (LOPRESSOR) 25 MG tablet TAKE ONE-HALF TABLET BY MOUTH TWO TIMES DAILY 90 tablet 0  . niacin (SLO-NIACIN) 500 MG tablet Take 3 tablets (1,500 mg total) by mouth at bedtime. 30 tablet 0  . nitroGLYCERIN (NITROSTAT) 0.4 MG SL tablet Place 1 tablet (0.4 mg total) under the tongue every 5 (five) minutes as needed for chest pain. 25 tablet 10  . potassium chloride SA (K-DUR,KLOR-CON) 20 MEQ tablet Take 1 tablet (20 mEq total) by mouth daily. 90 tablet 0  . rosuvastatin (CRESTOR) 20 MG tablet Take 1 tablet (20 mg total) by mouth daily. 90 tablet 1  . sucralfate (CARAFATE) 1 GM/10ML suspension Take 10 mLs (1 g total)  by mouth 4 (four) times daily -  with meals and at bedtime. 420 mL 1  . tamsulosin (FLOMAX) 0.4 MG CAPS capsule TAKE ONE CAPSULE BY MOUTH TWICE A DAY. 60 capsule 11  . warfarin (COUMADIN) 5 MG tablet TAKE 1 TABLET DAILY. TAKE AS DIRECTED BYPHYSICIAN 90 tablet 1   No current facility-administered medications on file prior to visit.     ALLERGIES: Allergies  Allergen Reactions  . Neurontin [Gabapentin] Other (See Comments)    Caused confusion  . Imitrex [Sumatriptan] Nausea And Vomiting and Other (See Comments)    "made me like I was having a stoke"  . Lipitor [Atorvastatin] Nausea And Vomiting and Other (See Comments)    INTOLERANCE > MYALGIAS "couldn't get out of the bed ( pt had to crawl out of bed ) I hurt so bad"    FAMILY HISTORY: Family History  Problem Relation Age of Onset  . Hypertension Mother   . Stroke Mother   . Heart disease Father   . Heart attack Father   . Diabetes Sister     . Cancer Brother 72       Lung  . Diabetes Sister     SOCIAL HISTORY: Social History   Socioeconomic History  . Marital status: Married    Spouse name: Not on file  . Number of children: Not on file  . Years of education: Not on file  . Highest education level: Not on file  Occupational History  . Not on file  Social Needs  . Financial resource strain: Not on file  . Food insecurity:    Worry: Not on file    Inability: Not on file  . Transportation needs:    Medical: Not on file    Non-medical: Not on file  Tobacco Use  . Smoking status: Former Smoker    Types: Cigarettes    Last attempt to quit: 06/20/1992    Years since quitting: 25.8  . Smokeless tobacco: Never Used  Substance and Sexual Activity  . Alcohol use: No    Alcohol/week: 0.0 standard drinks  . Drug use: No  . Sexual activity: Not on file  Lifestyle  . Physical activity:    Days per week: Not on file    Minutes per session: Not on file  . Stress: Not on file  Relationships  . Social connections:    Talks on phone: Not on file    Gets together: Not on file    Attends religious service: Not on file    Active member of club or organization: Not on file    Attends meetings of clubs or organizations: Not on file    Relationship status: Not on file  . Intimate partner violence:    Fear of current or ex partner: Not on file    Emotionally abused: Not on file    Physically abused: Not on file    Forced sexual activity: Not on file  Other Topics Concern  . Not on file  Social History Narrative  . Not on file    REVIEW OF SYSTEMS: Constitutional: No fevers, chills, or sweats, no generalized fatigue, change in appetite Eyes: No visual changes, double vision, eye pain Ear, nose and throat: No hearing loss, ear pain, nasal congestion, sore throat Cardiovascular: No chest pain, palpitations Respiratory:  No shortness of breath at rest or with exertion, wheezes GastrointestinaI: No nausea, vomiting,  diarrhea, abdominal pain, fecal incontinence Genitourinary:  No dysuria, urinary retention or frequency Musculoskeletal:  No  neck pain, back pain Integumentary: No rash, pruritus, skin lesions Neurological: as above Psychiatric: No depression, insomnia, anxiety Endocrine: No palpitations, fatigue, diaphoresis, mood swings, change in appetite, change in weight, increased thirst Hematologic/Lymphatic:  No anemia, purpura, petechiae. Allergic/Immunologic: no itchy/runny eyes, nasal congestion, recent allergic reactions, rashes  PHYSICAL EXAM: Vitals:   04/03/18 1447  BP: 110/66  Pulse: 79  SpO2: 94%   General: No acute distress Head:  Normocephalic/atraumatic Neck: supple, no paraspinal tenderness, full range of motion Heart:  Regular rate and rhythm Lungs:  Clear to auscultation bilaterally Back: No paraspinal tenderness Skin/Extremities: No rash, no edema Neurological Exam: Mental status: alert and oriented to person, place, season. No dysarthria or aphasia, Fund of knowledge is reduced.  Recent and remote memory are impaired. Attention and concentration are normal.    Able to name objects and repeat phrases. CDT 4/5 MMSE - Mini Mental State Exam 04/03/2018 09/18/2017 12/20/2016  Orientation to time 1 3 1   Orientation to Place 5 5 5   Registration 3 3 3   Attention/ Calculation 2 3 4   Recall 0 0 0  Language- name 2 objects 2 2 2   Language- repeat 1 1 1   Language- follow 3 step command 3 3 3   Language- read & follow direction 1 1 1   Write a sentence 1 1 0  Copy design 0 1 0  Total score 19 23 20    Cranial nerves: CN I: not tested CN II: pupils equal, round and reactive to light, visual fields intact CN III, IV, VI:  full range of motion, no nystagmus, no ptosis CN V: facial sensation intact CN VII: upper and lower face symmetric CN VIII: hearing intact to conversation CN IX, X: gag intact, uvula midline CN XI: sternocleidomastoid and trapezius muscles intact CN XII: tongue  midline Bulk & Tone: normal, no fasciculations. Motor: 5/5 throughout with no pronator drift. Sensation: intact to light touch.  No extinction to double simultaneous stimulation.  Romberg test negative Cerebellar: no incoordination on finger to nose testing Gait: narrow-based and steady, uses cane for balance, no ataxia   Tremor: none  IMPRESSION: This is a pleasant 77 yo RH man with a multiple medical issues including CAD s/p CABG in 1995, abdominal aortic aneurysm, carotid stenosis s/p bilateral CEA, diverticulosis, atrial fibrillation, hypertension, hiatal hernia, hypothyroidism, osteoporosis, dyslipidemia, renal artery stenosis, who fell off a 20-foot ladder last March 2018 sustaining pelvic and transverse process fractures. As part of his evaluation, he had a CT C/A/P which showed pulmonary densities with biopsy consistent with adenocarcinoma. He underwent chemotherapy/radiation and continues on immunotherapy. During his hospitalization, his brain imaging showed bilateral strokes, with evolving left frontal subacute to chronic hemorrhagic infarct or cortical contusion, and watershed infarcts in the right hemisphere concerning for proximal high grade carotid stenosis. CTA neck showed 80% stenosis of the distal right common carotid artery with irregular plaque with ulceration. He continues to follow closely with Vascular surgery. Memory stable per wife, MMSE today 19/30. Symptoms suggestive of mild to moderate dementia. Increase Donepezil to 10mg  daily. He does not drive. Home safety precautions discussed. He and his wife know to go to the ER immediately for any change in symptoms. He will follow-up in 6 months and knows to call for any changes.   Thank you for allowing me to participate in his care.  Please do not hesitate to call for any questions or concerns.  The duration of this appointment visit was 30 minutes of face-to-face time with the patient.  Greater  than 50% of this time was spent in  counseling, explanation of diagnosis, planning of further management, and coordination of care.   [name] [name], M.D.   CC: Dr. Wolfgang Phoenix, Dr. Julien Nordmann, Dr. Donnetta Hutching

## 2018-04-03 NOTE — Patient Instructions (Signed)
1. Increase Donepezil (Aricept) to 10mg  daily: With your current bottle of Donepezil 5mg : Take 2 tablets daily, once done, your new bottle will be for Donepezil 10mg : Take 1 tablet daily  2. Continue all your other medications  3. Follow-up in 6 months, call for any changes  FALL PRECAUTIONS: Be cautious when walking. Scan the area for obstacles that may increase the risk of trips and falls. When getting up in the mornings, sit up at the edge of the bed for a few minutes before getting out of bed. Consider elevating the bed at the head end to avoid drop of blood pressure when getting up. Walk always in a well-lit room (use night lights in the walls). Avoid area rugs or power cords from appliances in the middle of the walkways. Use a walker or a cane if necessary and consider physical therapy for balance exercise. Get your eyesight checked regularly.  HOME SAFETY: Consider the safety of the kitchen when operating appliances like stoves, microwave oven, and blender. Consider having supervision and share cooking responsibilities until no longer able to participate in those. Accidents with firearms and other hazards in the house should be identified and addressed as well.  ABILITY TO BE LEFT ALONE: If patient is unable to contact 911 operator, consider using LifeLine, or when the need is there, arrange for someone to stay with patients. Smoking is a fire hazard, consider supervision or cessation. Risk of wandering should be assessed by caregiver and if detected at any point, supervision and safe proof recommendations should be instituted.   RECOMMENDATIONS FOR ALL PATIENTS WITH MEMORY PROBLEMS: 1. Continue to exercise (Recommend 30 minutes of walking everyday, or 3 hours every week) 2. Increase social interactions - continue going to [place] and enjoy social gatherings with friends and family 3. Eat healthy, avoid fried foods and eat more fruits and vegetables 4. Maintain adequate blood pressure, blood  sugar, and blood cholesterol level. Reducing the risk of stroke and cardiovascular disease also helps promoting better memory. 5. Avoid stressful situations. Live a simple life and avoid aggravations. Organize your time and prepare for the next day in anticipation. 6. Sleep well, avoid any interruptions of sleep and avoid any distractions in the bedroom that may interfere with adequate sleep quality 7. Avoid sugar, avoid sweets as there is a strong link between excessive sugar intake, diabetes, and cognitive impairment The Mediterranean diet has been shown to help patients reduce the risk of progressive memory disorders and reduces cardiovascular risk. This includes eating fish, eat fruits and green leafy vegetables, nuts like almonds and hazelnuts, walnuts, and also use olive oil. Avoid fast foods and fried foods as much as possible. Avoid sweets and sugar as sugar use has been linked to worsening of memory function.  There is always a concern of gradual progression of memory problems. If this is the case, then we may need to adjust level of care according to patient needs. Support, both to the patient and caregiver, should then be put into place.

## 2018-04-09 ENCOUNTER — Ambulatory Visit: Payer: Medicare Other | Admitting: Orthopedic Surgery

## 2018-04-17 DIAGNOSIS — H25811 Combined forms of age-related cataract, right eye: Secondary | ICD-10-CM | POA: Diagnosis not present

## 2018-04-17 DIAGNOSIS — H25812 Combined forms of age-related cataract, left eye: Secondary | ICD-10-CM | POA: Diagnosis not present

## 2018-04-19 ENCOUNTER — Other Ambulatory Visit: Payer: Self-pay | Admitting: Family Medicine

## 2018-04-26 ENCOUNTER — Ambulatory Visit (INDEPENDENT_AMBULATORY_CARE_PROVIDER_SITE_OTHER): Payer: Medicare Other | Admitting: *Deleted

## 2018-04-26 DIAGNOSIS — Z7901 Long term (current) use of anticoagulants: Secondary | ICD-10-CM

## 2018-04-26 DIAGNOSIS — I6529 Occlusion and stenosis of unspecified carotid artery: Secondary | ICD-10-CM

## 2018-04-26 LAB — POCT INR: INR: 3.5 — AB (ref 2.0–3.0)

## 2018-05-01 ENCOUNTER — Other Ambulatory Visit: Payer: Self-pay | Admitting: Family Medicine

## 2018-05-08 ENCOUNTER — Inpatient Hospital Stay: Payer: Medicare Other | Attending: Nurse Practitioner

## 2018-05-08 ENCOUNTER — Ambulatory Visit (HOSPITAL_COMMUNITY)
Admission: RE | Admit: 2018-05-08 | Discharge: 2018-05-08 | Disposition: A | Discharge status: Home / Self Care | Payer: Medicare Other | Source: Ambulatory Visit | Attending: Oncology | Admitting: Oncology

## 2018-05-08 ENCOUNTER — Encounter (HOSPITAL_COMMUNITY): Payer: Self-pay

## 2018-05-08 DIAGNOSIS — R21 Rash and other nonspecific skin eruption: Secondary | ICD-10-CM | POA: Insufficient documentation

## 2018-05-08 DIAGNOSIS — C342 Malignant neoplasm of middle lobe, bronchus or lung: Secondary | ICD-10-CM

## 2018-05-08 DIAGNOSIS — C3490 Malignant neoplasm of unspecified part of unspecified bronchus or lung: Secondary | ICD-10-CM | POA: Diagnosis not present

## 2018-05-08 DIAGNOSIS — Z85118 Personal history of other malignant neoplasm of bronchus and lung: Secondary | ICD-10-CM | POA: Diagnosis not present

## 2018-05-08 DIAGNOSIS — J439 Emphysema, unspecified: Secondary | ICD-10-CM | POA: Insufficient documentation

## 2018-05-08 DIAGNOSIS — R918 Other nonspecific abnormal finding of lung field: Secondary | ICD-10-CM | POA: Diagnosis not present

## 2018-05-08 LAB — CBC WITH DIFFERENTIAL (CANCER CENTER ONLY)
Abs Immature Granulocytes: 0.01 10*3/uL (ref 0.00–0.07)
BASOS ABS: 0 10*3/uL (ref 0.0–0.1)
BASOS PCT: 1 %
EOS ABS: 0.1 10*3/uL (ref 0.0–0.5)
EOS PCT: 2 %
HCT: 41.1 % (ref 39.0–52.0)
Hemoglobin: 14.3 g/dL (ref 13.0–17.0)
IMMATURE GRANULOCYTES: 0 %
Lymphocytes Relative: 21 %
Lymphs Abs: 1.1 10*3/uL (ref 0.7–4.0)
MCH: 30.4 pg (ref 26.0–34.0)
MCHC: 34.8 g/dL (ref 30.0–36.0)
MCV: 87.4 fL (ref 80.0–100.0)
MONOS PCT: 6 %
Monocytes Absolute: 0.3 10*3/uL (ref 0.1–1.0)
NEUTROS PCT: 70 %
Neutro Abs: 3.8 10*3/uL (ref 1.7–7.7)
PLATELETS: 148 10*3/uL — AB (ref 150–400)
RBC: 4.7 MIL/uL (ref 4.22–5.81)
RDW: 14.3 % (ref 11.5–15.5)
WBC: 5.4 10*3/uL (ref 4.0–10.5)
nRBC: 0 % (ref 0.0–0.2)

## 2018-05-08 LAB — CMP (CANCER CENTER ONLY)
ALK PHOS: 122 U/L (ref 38–126)
ALT: 15 U/L (ref 0–44)
ANION GAP: 10 (ref 5–15)
AST: 24 U/L (ref 15–41)
Albumin: 3.9 g/dL (ref 3.5–5.0)
BUN: 14 mg/dL (ref 8–23)
CALCIUM: 9.7 mg/dL (ref 8.9–10.3)
CO2: 25 mmol/L (ref 22–32)
Chloride: 101 mmol/L (ref 98–111)
Creatinine: 0.98 mg/dL (ref 0.61–1.24)
Glucose, Bld: 92 mg/dL (ref 70–99)
Potassium: 4.3 mmol/L (ref 3.5–5.1)
SODIUM: 136 mmol/L (ref 135–145)
TOTAL PROTEIN: 7.4 g/dL (ref 6.5–8.1)
Total Bilirubin: 0.7 mg/dL (ref 0.3–1.2)

## 2018-05-08 MED ORDER — SODIUM CHLORIDE (PF) 0.9 % IJ SOLN
INTRAMUSCULAR | Status: AC
  Filled 2018-05-08: qty 50

## 2018-05-08 MED ORDER — IOHEXOL 300 MG/ML  SOLN
75.0000 mL | Freq: Once | INTRAMUSCULAR | Status: AC | PRN
  Administered 2018-05-08: 75 mL via INTRAVENOUS

## 2018-05-10 ENCOUNTER — Encounter: Payer: Self-pay | Admitting: Internal Medicine

## 2018-05-10 ENCOUNTER — Telehealth: Payer: Self-pay | Admitting: Internal Medicine

## 2018-05-10 ENCOUNTER — Inpatient Hospital Stay (HOSPITAL_BASED_OUTPATIENT_CLINIC_OR_DEPARTMENT_OTHER): Payer: Medicare Other | Admitting: Internal Medicine

## 2018-05-10 DIAGNOSIS — Z85118 Personal history of other malignant neoplasm of bronchus and lung: Secondary | ICD-10-CM

## 2018-05-10 DIAGNOSIS — R21 Rash and other nonspecific skin eruption: Secondary | ICD-10-CM | POA: Diagnosis not present

## 2018-05-10 DIAGNOSIS — C3490 Malignant neoplasm of unspecified part of unspecified bronchus or lung: Secondary | ICD-10-CM

## 2018-05-10 MED ORDER — METHYLPREDNISOLONE 4 MG PO TBPK: ORAL_TABLET | ORAL | 0 refills | Status: DC

## 2018-05-10 NOTE — Telephone Encounter (Signed)
Printed calendar and avs. °

## 2018-05-10 NOTE — Progress Notes (Signed)
[facility] [phone]   [phone]  OFFICE PROGRESS NOTE  [name], MD [address] [place] [address]  DIAGNOSIS: Stage IIIA (T1b, N2, M0) non-small cell lung cancer, adenocarcinoma with positive PDL 1 expression 50% diagnosed in March 2018 and presented with right upper lobe lung nodule in addition to mediastinal lymphadenopathy.  PRIOR THERAPY:  1) Course of concurrent chemoradiation with weekly carboplatin for AUC of 2 and paclitaxel 45 MG/M2. First dose of chemotherapy 10/17/2016. Status post 8 cycles. Last dose was given 12/05/2016 with partial response. 2) Consolidation treatment with immunotherapy with Imfinzi (Durvalumab) 10 MG/KG every 2 weeks. First dose 01/18/2017. Status post 26 cycles.   CURRENT THERAPY: Observation.  INTERVAL HISTORY: [name] [name] 77 y.o. male returns to the clinic today for follow-up visit accompanied by his wife.  The patient is feeling fine today with no specific complaints except for a change of taste and mild skin rash in the lower extremities.  He denied having any chest pain but has shortness of breath with exertion with no cough or hemoptysis.  He denied having any fever or chills.  He has no nausea, vomiting, diarrhea or constipation.  He had repeat CT scan of the chest performed recently and is here for evaluation and discussion of his discuss results.  MEDICAL HISTORY: Past Medical History:  Diagnosis Date  . Adenocarcinoma of right lung, stage 3 ([facility]) 09/20/2016  . Atrial fibrillation ([name])    no hx of reported at preop visit of 04/21/11   . CAD (coronary artery disease)   . Carotid artery occlusion    left carotid endarterectomy  . Chronic kidney disease    AAA repair with reimplant of renals   . CHRONIC OBSTRUCTIVE PULMONARY DISEASE    pt denied at visit of 04/21/11   . CORONARY ARTERY DISEASE   . Dehydration 11/15/2016  . Disorder of left sacroiliac joint 08/24/2016  .  DIVERTICULOSIS OF COLON   . Encounter for antineoplastic chemotherapy 10/17/2016  . GERD   . H/O hiatal hernia   . Headache(784.0)    Hx: of years of years  . HYPERTENSION   . HYPOTHYROIDISM   . JOINT EFFUSION, KNEE   . KNEE, ARTHRITIS, DEGEN./OSTEO    right knee   . Myocardial infarction ([facility])    1994   . NEPHROLITHIASIS, HX OF   . OSTEOPOROSIS   . Other dysphagia   . PERIPHERAL VASCULAR DISEASE    AAA - 1994 with reimplant of renals   . Peripheral vascular disease ([facility])    subclavian stenosis PTA - 3/08   . Pure hypercholesterolemia   . Renal artery stenosis ([facility])     ALLERGIES:  is allergic to neurontin [gabapentin]; imitrex [sumatriptan]; and lipitor [atorvastatin].  MEDICATIONS:  Current Outpatient Medications  Medication Sig Dispense Refill  . acetaminophen (TYLENOL) 325 MG tablet Take 325-650 mg by mouth every 6 (six) hours as needed for headache (for pain).     [name] ALPRAZolam (XANAX) 0.25 MG tablet Take 1 tablet (0.25 mg total) by mouth 3 (three) times daily as needed for anxiety. 30 tablet 0  . bumetanide (BUMEX) 1 MG tablet Take 1 tablet (1 mg total) by mouth daily as needed (swelling). 30 tablet 1  . donepezil (ARICEPT) 10 MG tablet Take 1 tablet daily 90 tablet 3  . lansoprazole (PREVACID) 15 MG capsule Take 1 capsule (15 mg total) by mouth daily. 30 capsule 5  . levothyroxine (SYNTHROID,  LEVOTHROID) 112 MCG tablet Take 1 tablet (112 mcg total) by mouth daily. 90 tablet 1  . linaclotide (LINZESS) 145 MCG CAPS capsule Take 1 capsule (145 mcg total) by mouth daily before breakfast. 30 capsule 5  . magic mouthwash w/lidocaine SOLN Composition: 80 cc each of Extra St. Maalox Plus, Diphenhydramine, and Nystatin. Plus 240 cc of 2% viscous lidocaine to make 480 cc. Swish and swallow 2 tsp four times per day. 480 mL 2  . metoprolol tartrate (LOPRESSOR) 25 MG tablet TAKE ONE-HALF TABLET BY MOUTH TWO TIMES DAILY 90 tablet 0  . niacin (SLO-NIACIN) 500 MG tablet Take 3 tablets  (1,500 mg total) by mouth at bedtime. 30 tablet 0  . nitroGLYCERIN (NITROSTAT) 0.4 MG SL tablet Place 1 tablet (0.4 mg total) under the tongue every 5 (five) minutes as needed for chest pain. 25 tablet 10  . potassium chloride SA (K-DUR,KLOR-CON) 20 MEQ tablet Take 1 tablet (20 mEq total) by mouth daily. 90 tablet 0  . potassium chloride SA (K-DUR,KLOR-CON) 20 MEQ tablet TAKE 1 TABLET (20 MEQ TOTAL) BY MOUTH DAILY 30 tablet 0  . rosuvastatin (CRESTOR) 20 MG tablet Take 1 tablet (20 mg total) by mouth daily. 90 tablet 1  . sucralfate (CARAFATE) 1 GM/10ML suspension Take 10 mLs (1 g total) by mouth 4 (four) times daily -  with meals and at bedtime. 420 mL 1  . tamsulosin (FLOMAX) 0.4 MG CAPS capsule TAKE ONE CAPSULE BY MOUTH TWICE A DAY. 60 capsule 11  . warfarin (COUMADIN) 5 MG tablet TAKE ONE TABLET ON MONDAY WEDNESDAY AND SATURDAY, TAKE ONE AND ONE-HALF ON SUNDAY TUESDAY FRIDAY 111 tablet 0   No current facility-administered medications for this visit.     SURGICAL HISTORY:  Past Surgical History:  Procedure Laterality Date  . ABDOMINAL AORTIC ANEURYSM REPAIR     with reimplantation of renals   . CARDIAC CATHETERIZATION     2008  . CAROTID ENDARTERECTOMY Left Jan. 30, 2015   CEA  . CHOLECYSTECTOMY  2000   Gall Bladder  . COLONOSCOPY W/ BIOPSIES AND POLYPECTOMY     Hx: of  . CORONARY ARTERY BYPASS GRAFT     19 95  . CORONARY STENT PLACEMENT     Hx: of  . ENDARTERECTOMY Left 07/19/2013   Procedure: ENDARTERECTOMY CAROTID;  Surgeon: Mal Misty, MD;  Location: [facility];  Service: Vascular;  Laterality: Left;  . GALLBLADDER SURGERY  2004   . JOINT REPLACEMENT     partial knee replacement on left 2002   . KNEE SURGERY    . ORIF PELVIC FRACTURE Left 08/25/2016   Procedure: OPEN REDUCTION INTERNAL FIXATION (ORIF) PELVIC FRACTURE; plate on front, SI screw on the back;  Surgeon: Altamese Landisburg, MD;  Location: [facility];  Service: Orthopedics;  Laterality: Left;  . OTHER SURGICAL HISTORY     left  subclavian stenosis surgery PTA 08/2006   . OTHER SURGICAL HISTORY     carotid surgery on right 2004   . TOTAL KNEE ARTHROPLASTY  04/29/2011   Procedure: TOTAL KNEE ARTHROPLASTY;  Surgeon: Gearlean Alf;  Location: [facility];  Service: Orthopedics;  Laterality: Right;  . urinary retention    . VASCULAR SURGERY     AAA  . VASECTOMY  1973  . VIDEO BRONCHOSCOPY WITH ENDOBRONCHIAL ULTRASOUND [name] 09/09/2016   Procedure: VIDEO BRONCHOSCOPY WITH ENDOBRONCHIAL ULTRASOUND;  Surgeon: Grace Isaac, MD;  Location: [facility];  Service: Thoracic;  Laterality: [name];    REVIEW OF SYSTEMS:  A  comprehensive review of systems was negative except for: Integument/breast: positive for rash   PHYSICAL EXAMINATION: General appearance: alert, cooperative and no distress Head: Normocephalic, without obvious abnormality, atraumatic Neck: no adenopathy, no JVD, supple, symmetrical, trachea midline and thyroid not enlarged, symmetric, no tenderness/mass/nodules Lymph nodes: Cervical, supraclavicular, and axillary nodes normal. Resp: clear to auscultation bilaterally Back: symmetric, no curvature. ROM normal. No CVA tenderness. Cardio: regular rate and rhythm, S1, S2 normal, no murmur, click, rub or gallop GI: soft, non-tender; bowel sounds normal; no masses,  no organomegaly Extremities: extremities normal, atraumatic, no cyanosis or edema  ECOG PERFORMANCE STATUS: 1 - Symptomatic but completely ambulatory  Blood pressure (!) 125/57, pulse 61, temperature 98 F (36.7 C), temperature source Oral, resp. rate 17, height 5\' 7"  (1.702 m), weight 128 lb 12.8 oz (58.4 kg), SpO2 92 %.  LABORATORY DATA: Lab Results  Component Value Date   WBC 5.4 05/08/2018   HGB 14.3 05/08/2018   HCT 41.1 05/08/2018   MCV 87.4 05/08/2018   PLT 148 (L) 05/08/2018      Chemistry      Component Value Date/Time   NA 136 05/08/2018 0738   NA 136 06/21/2017 1246   K 4.3 05/08/2018 0738   K 3.6 06/21/2017 1246   CL 101 05/08/2018  0738   CO2 25 05/08/2018 0738   CO2 25 06/21/2017 1246   BUN 14 05/08/2018 0738   BUN 14.0 06/21/2017 1246   CREATININE 0.98 05/08/2018 0738   CREATININE 0.8 06/21/2017 1246      Component Value Date/Time   CALCIUM 9.7 05/08/2018 0738   CALCIUM 9.4 06/21/2017 1246   ALKPHOS 122 05/08/2018 0738   ALKPHOS 139 06/21/2017 1246   AST 24 05/08/2018 0738   AST 18 06/21/2017 1246   ALT 15 05/08/2018 0738   ALT 17 06/21/2017 1246   BILITOT 0.7 05/08/2018 0738   BILITOT 0.41 06/21/2017 1246       RADIOGRAPHIC STUDIES: Ct Chest W Contrast  Result Date: 05/08/2018 CLINICAL DATA:  Stage IIIA non-small cell lung cancer, adenocarcinoma. Chemotherapy and radiation therapy. Dysphagia. On immunotherapy. Right middle lobe primary. EXAM: CT CHEST WITH CONTRAST TECHNIQUE: Multidetector CT imaging of the chest was performed during intravenous contrast administration. CONTRAST:  86mL OMNIPAQUE IOHEXOL 300 MG/ML  SOLN COMPARISON:  01/22/2018 FINDINGS: Cardiovascular: Advanced aortic and branch vessel atherosclerosis. Mild cardiomegaly with prior median sternotomy for CABG. No pericardial effusion. No central pulmonary embolism, on this non-dedicated study. Mediastinum/Nodes: Low right jugular 1.6 cm node on image 18/2, enlarged from 1.3 cm on the prior exam (when remeasured). Just inferior to this, a high pretracheal node measures 11 mm on image 37/2 versus 9 mm on the prior PET High right paratracheal node of 11 mm on image 20/2, 10 mm on the prior. Subcarinal node of 9 mm is similar and not pathologic by size criteria. Right hilum poorly evaluated secondary to the extent of radiation fibrosis. No dominant adenopathy. Lungs/Pleura: Trace right-sided pleural thickening or fluid, similar. Moderate centrilobular emphysema. Right upper lobe pulmonary nodule of 5 mm on image 35/7 is felt to be similar to the prior. More inferior right upper lobe 2 mm nodule is similar on image 68/7. Just inferior to this, a 5 mm  right upper lobe nodule on image 71/7 is new. Geographic consolidation and traction bronchiectasis centered about the medial and perihilar right lung, similar. No well-defined locally recurrent or residual disease. Upper Abdomen: Nonspecific caudate lobe enlargement. Cholecystectomy. Normal imaged portions of the pancreas, spleen, right  adrenal gland, left kidney. Mild right renal scarring with probable renal vascular calcification. Mild left adrenal thickening and nodularity are similar. Abdominal aortic and branch vessel atherosclerosis. Musculoskeletal: Mild compression deformities at T6 and T8, similar. IMPRESSION: 1. Similar appearance of right perihilar radiation fibrosis, without well-defined locally recurrent disease. 2. Enlargement of lower cervical and upper mediastinal nodes, suspicious for progressive nodal metastasis. 3. 5 mm posterior right upper lobe pulmonary nodule, likely new. Cannot exclude pulmonary metastasis. Other pulmonary nodules are similar. 4. Aortic atherosclerosis (ICD10-I70.0) and emphysema (ICD10-J43.9). Electronically Signed   By: Abigail Miyamoto M.D.   On: 05/08/2018 11:47    ASSESSMENT AND PLAN:  This is a very pleasant 77 years old white male with a stage IIIa non-small cell lung cancer, adenocarcinoma with positive PDL 1 expression of 50%. The patient completed a course of concurrent chemoradiation with weekly carboplatin and paclitaxel status post 8 weeks and tolerated his treatment well except for mild dysphagia and odynophagia. The patient was treated with consolidation immunotherapy with Imfinzi (Durvalumab) status post 26 cycles.   He tolerated his treatment well with no concerning complaints.  The patient is currently on observation.  He had repeat CT scan of the chest performed recently.  I personally and independently reviewed the scan images and discussed the results with the patient his wife.  His a scan showed stable disease except for a slightly enlarging lower  cervical and upper mediastinal lymph nodes. I discussed with the patient his treatment options including starting systemic chemotherapy versus close observation and monitoring with repeat CT scan in short. The patient and his wife would like to continue on observation for now because of the holiday season. I will see him back for follow-up visit in 2 months for evaluation with repeat CT scan of the chest for restaging of his disease and discussion of treatment options at that time. For the skin rash, this could be secondary to his previous treatment with immunotherapy.  I will start the patient on Medrol Dosepak for now.  He was also advised to apply hydrocortisone cream to these areas. He was advised to call immediately if he has any concerning symptoms in the interval. The patient voices understanding of current disease status and treatment options and is in agreement with the current care plan. All questions were answered. The patient knows to call the clinic with any problems, questions or concerns. We can certainly see the patient much sooner if necessary.  Disclaimer: This note was dictated with voice recognition software. Similar sounding words can inadvertently be transcribed and may not be corrected upon review.

## 2018-05-18 ENCOUNTER — Telehealth: Payer: Self-pay | Admitting: Medical Oncology

## 2018-05-18 ENCOUNTER — Other Ambulatory Visit: Payer: Self-pay | Admitting: Medical Oncology

## 2018-05-18 DIAGNOSIS — B370 Candidal stomatitis: Secondary | ICD-10-CM

## 2018-05-18 MED ORDER — NYSTATIN 100000 UNIT/ML MT SUSP: 5.0000 mL | Freq: Four times a day (QID) | OROMUCOSAL | 0 refills | Status: DC

## 2018-05-18 NOTE — Progress Notes (Signed)
Pt.notified

## 2018-05-18 NOTE — Telephone Encounter (Signed)
She reports pt just completed a medrol dose pack and stated "it looks like he has thrush in his mouth and a lot of burning. He is not eating well. Per Carlos Levering , I called in nystatin to local pharmacy.

## 2018-05-18 NOTE — Telephone Encounter (Signed)
dtr in law reported to [facility] that pt has c/o of burning in mouth

## 2018-05-21 ENCOUNTER — Other Ambulatory Visit: Payer: Self-pay | Admitting: Oncology

## 2018-05-21 DIAGNOSIS — B370 Candidal stomatitis: Secondary | ICD-10-CM

## 2018-05-24 ENCOUNTER — Ambulatory Visit: Payer: Medicare Other

## 2018-05-29 ENCOUNTER — Ambulatory Visit (INDEPENDENT_AMBULATORY_CARE_PROVIDER_SITE_OTHER): Payer: Medicare Other | Admitting: Family Medicine

## 2018-05-29 ENCOUNTER — Encounter: Payer: Self-pay | Admitting: Family Medicine

## 2018-05-29 VITALS — BP 116/82 | Ht 67.0 in | Wt 124.6 lb

## 2018-05-29 DIAGNOSIS — I10 Essential (primary) hypertension: Secondary | ICD-10-CM | POA: Diagnosis not present

## 2018-05-29 DIAGNOSIS — Z7901 Long term (current) use of anticoagulants: Secondary | ICD-10-CM | POA: Diagnosis not present

## 2018-05-29 DIAGNOSIS — G988 Other disorders of nervous system: Secondary | ICD-10-CM

## 2018-05-29 DIAGNOSIS — I482 Chronic atrial fibrillation, unspecified: Secondary | ICD-10-CM | POA: Diagnosis not present

## 2018-05-29 DIAGNOSIS — I4891 Unspecified atrial fibrillation: Secondary | ICD-10-CM | POA: Diagnosis not present

## 2018-05-29 DIAGNOSIS — I6529 Occlusion and stenosis of unspecified carotid artery: Secondary | ICD-10-CM | POA: Diagnosis not present

## 2018-05-29 DIAGNOSIS — F0280 Dementia in other diseases classified elsewhere without behavioral disturbance: Secondary | ICD-10-CM | POA: Diagnosis not present

## 2018-05-29 DIAGNOSIS — F0390 Unspecified dementia without behavioral disturbance: Secondary | ICD-10-CM

## 2018-05-29 LAB — POCT INR: INR: 6.4 — AB (ref 2.0–3.0)

## 2018-05-29 NOTE — Patient Instructions (Signed)
Carnation instant breakfast, at least two per day

## 2018-05-29 NOTE — Progress Notes (Signed)
   Subjective:    Patient ID: [name] [name], male    DOB: Jan 12, 1941, 77 y.o.   MRN: [id number] Patient arrives with numerous concerns Hypertension  This is a chronic problem. The current episode started more than 1 year ago. Risk factors for coronary artery disease include dyslipidemia. There are no compliance problems.    Patient has been on nystatin solution for thrush started several days ago  Notes discomfort on tongue and throat.  Reports has improved somewhat.  Continues to receive intervention for lung cancer.  Most recent scan reveals somewhat increased involvement of lymph nodes in the chest.  Holding off on another round of chemo at this time per discussion with their oncologist.  Due to have another scan in the middle of June   Results for orders placed or performed in visit on 05/29/18  POCT INR  Result Value Ref Range   INR 6.4 (A) 2.0 - 3.0    Pt took the nystatin for the thrush    Patient has lost some weight.  Appetite has not been good with recent thrush improved somewhat  Review of Systems No headache, no major weight loss or weight gain, no chest pain no back pain abdominal pain no change in bowel habits complete ROS otherwise negative     Objective:   Physical Exam  Alert and oriented, vitals reviewed and stable, patient is thin and has lost some weight ENT-TM's and ext canals WNL bilat via otoscopic exam pharynx reveals diffuse erythema no whitish patches some glossitis evident soft palate, tonsils and post pharynx WNL via oropharyngeal exam Neck-symmetric, no masses; thyroid nonpalpable and nontender Pulmonary-no tachypnea or accessory muscle use; diminished breath sounds at the bases Clear without wheezes via auscultation Card--no abnrml murmurs, rhythm reg and rate WNL Carotid pulses symmetric, without bruits Abdominal exam no discrete tenderness      Assessment & Plan:  Impression 1 thrush.  Discussed.  Patient will continue same treatment  2.   INR.  Very high.  Possible as to why this is.  No recent antibiotics no recent change in meds will hold Coumadin for now.  Recheck in 72 hours.  No active bleeding currently  3.  Lung cancer.  Unfortunately last scan revealed some marginal nodes discussed to be followed up  4.  Hypertension fair control discussed  5.  Dementia clinically stable per family  Follow-up on Friday for INR recheck/follow-up several months

## 2018-05-31 ENCOUNTER — Other Ambulatory Visit: Payer: Self-pay | Admitting: Family Medicine

## 2018-06-01 ENCOUNTER — Ambulatory Visit (INDEPENDENT_AMBULATORY_CARE_PROVIDER_SITE_OTHER): Payer: Medicare Other | Admitting: *Deleted

## 2018-06-01 DIAGNOSIS — Z7901 Long term (current) use of anticoagulants: Secondary | ICD-10-CM

## 2018-06-01 LAB — POCT INR: INR: 1.7 — AB (ref 2.0–3.0)

## 2018-06-01 NOTE — Progress Notes (Signed)
   Subjective:    Patient ID: [name] [name], male    DOB: 1941/03/14, 77 y.o.   MRN: [id number]  HPI  INR 1.7- Consult with Dr Richardson Landry- resume Coumadin 5mg  one tablet daily and recheck INR next Thursday or Friday. Patient verbalized understanding.  Review of Systems     Objective:   Physical Exam        Assessment & Plan:

## 2018-06-05 ENCOUNTER — Other Ambulatory Visit: Payer: Self-pay | Admitting: Family Medicine

## 2018-06-07 ENCOUNTER — Encounter: Payer: Self-pay | Admitting: Family Medicine

## 2018-06-07 ENCOUNTER — Emergency Department (HOSPITAL_COMMUNITY): Payer: Medicare Other

## 2018-06-07 ENCOUNTER — Inpatient Hospital Stay (HOSPITAL_COMMUNITY)
Admission: EM | Admit: 2018-06-07 | Discharge: 2018-06-15 | DRG: 189 | Disposition: A | Discharge status: Home Health | Payer: Medicare Other | Attending: Internal Medicine | Admitting: Internal Medicine

## 2018-06-07 ENCOUNTER — Encounter (HOSPITAL_COMMUNITY): Payer: Self-pay | Admitting: *Deleted

## 2018-06-07 ENCOUNTER — Other Ambulatory Visit: Payer: Self-pay

## 2018-06-07 ENCOUNTER — Ambulatory Visit (INDEPENDENT_AMBULATORY_CARE_PROVIDER_SITE_OTHER): Payer: Medicare Other | Admitting: Family Medicine

## 2018-06-07 VITALS — BP 98/64 | Temp 97.4°F | Ht 67.0 in | Wt 123.0 lb

## 2018-06-07 DIAGNOSIS — R0600 Dyspnea, unspecified: Secondary | ICD-10-CM | POA: Diagnosis not present

## 2018-06-07 DIAGNOSIS — Z951 Presence of aortocoronary bypass graft: Secondary | ICD-10-CM

## 2018-06-07 DIAGNOSIS — I429 Cardiomyopathy, unspecified: Secondary | ICD-10-CM | POA: Diagnosis not present

## 2018-06-07 DIAGNOSIS — Z888 Allergy status to other drugs, medicaments and biological substances status: Secondary | ICD-10-CM

## 2018-06-07 DIAGNOSIS — Z79890 Hormone replacement therapy (postmenopausal): Secondary | ICD-10-CM

## 2018-06-07 DIAGNOSIS — Z823 Family history of stroke: Secondary | ICD-10-CM

## 2018-06-07 DIAGNOSIS — J9601 Acute respiratory failure with hypoxia: Principal | ICD-10-CM | POA: Diagnosis present

## 2018-06-07 DIAGNOSIS — I214 Non-ST elevation (NSTEMI) myocardial infarction: Secondary | ICD-10-CM | POA: Diagnosis not present

## 2018-06-07 DIAGNOSIS — I4892 Unspecified atrial flutter: Secondary | ICD-10-CM | POA: Diagnosis present

## 2018-06-07 DIAGNOSIS — I5043 Acute on chronic combined systolic (congestive) and diastolic (congestive) heart failure: Secondary | ICD-10-CM | POA: Diagnosis present

## 2018-06-07 DIAGNOSIS — I10 Essential (primary) hypertension: Secondary | ICD-10-CM | POA: Diagnosis present

## 2018-06-07 DIAGNOSIS — I6529 Occlusion and stenosis of unspecified carotid artery: Secondary | ICD-10-CM | POA: Diagnosis not present

## 2018-06-07 DIAGNOSIS — R0603 Acute respiratory distress: Secondary | ICD-10-CM | POA: Diagnosis not present

## 2018-06-07 DIAGNOSIS — I130 Hypertensive heart and chronic kidney disease with heart failure and stage 1 through stage 4 chronic kidney disease, or unspecified chronic kidney disease: Secondary | ICD-10-CM | POA: Diagnosis present

## 2018-06-07 DIAGNOSIS — N183 Chronic kidney disease, stage 3 (moderate): Secondary | ICD-10-CM | POA: Diagnosis present

## 2018-06-07 DIAGNOSIS — I361 Nonrheumatic tricuspid (valve) insufficiency: Secondary | ICD-10-CM | POA: Diagnosis not present

## 2018-06-07 DIAGNOSIS — I482 Chronic atrial fibrillation, unspecified: Secondary | ICD-10-CM

## 2018-06-07 DIAGNOSIS — Z9221 Personal history of antineoplastic chemotherapy: Secondary | ICD-10-CM

## 2018-06-07 DIAGNOSIS — K219 Gastro-esophageal reflux disease without esophagitis: Secondary | ICD-10-CM | POA: Diagnosis present

## 2018-06-07 DIAGNOSIS — F0280 Dementia in other diseases classified elsewhere without behavioral disturbance: Secondary | ICD-10-CM | POA: Diagnosis not present

## 2018-06-07 DIAGNOSIS — M810 Age-related osteoporosis without current pathological fracture: Secondary | ICD-10-CM | POA: Diagnosis present

## 2018-06-07 DIAGNOSIS — J441 Chronic obstructive pulmonary disease with (acute) exacerbation: Secondary | ICD-10-CM | POA: Diagnosis present

## 2018-06-07 DIAGNOSIS — Z87891 Personal history of nicotine dependence: Secondary | ICD-10-CM

## 2018-06-07 DIAGNOSIS — Z7901 Long term (current) use of anticoagulants: Secondary | ICD-10-CM | POA: Diagnosis not present

## 2018-06-07 DIAGNOSIS — I714 Abdominal aortic aneurysm, without rupture: Secondary | ICD-10-CM | POA: Diagnosis present

## 2018-06-07 DIAGNOSIS — Z7982 Long term (current) use of aspirin: Secondary | ICD-10-CM

## 2018-06-07 DIAGNOSIS — C342 Malignant neoplasm of middle lobe, bronchus or lung: Secondary | ICD-10-CM | POA: Diagnosis present

## 2018-06-07 DIAGNOSIS — I739 Peripheral vascular disease, unspecified: Secondary | ICD-10-CM | POA: Diagnosis present

## 2018-06-07 DIAGNOSIS — I700 Atherosclerosis of aorta: Secondary | ICD-10-CM | POA: Diagnosis present

## 2018-06-07 DIAGNOSIS — Z809 Family history of malignant neoplasm, unspecified: Secondary | ICD-10-CM

## 2018-06-07 DIAGNOSIS — I2721 Secondary pulmonary arterial hypertension: Secondary | ICD-10-CM | POA: Diagnosis present

## 2018-06-07 DIAGNOSIS — I255 Ischemic cardiomyopathy: Secondary | ICD-10-CM | POA: Diagnosis present

## 2018-06-07 DIAGNOSIS — E785 Hyperlipidemia, unspecified: Secondary | ICD-10-CM | POA: Diagnosis present

## 2018-06-07 DIAGNOSIS — I452 Bifascicular block: Secondary | ICD-10-CM | POA: Diagnosis present

## 2018-06-07 DIAGNOSIS — F0390 Unspecified dementia without behavioral disturbance: Secondary | ICD-10-CM | POA: Diagnosis present

## 2018-06-07 DIAGNOSIS — E43 Unspecified severe protein-calorie malnutrition: Secondary | ICD-10-CM | POA: Diagnosis present

## 2018-06-07 DIAGNOSIS — R0902 Hypoxemia: Secondary | ICD-10-CM

## 2018-06-07 DIAGNOSIS — Z681 Body mass index (BMI) 19 or less, adult: Secondary | ICD-10-CM | POA: Diagnosis not present

## 2018-06-07 DIAGNOSIS — E7800 Pure hypercholesterolemia, unspecified: Secondary | ICD-10-CM | POA: Diagnosis present

## 2018-06-07 DIAGNOSIS — Z9049 Acquired absence of other specified parts of digestive tract: Secondary | ICD-10-CM

## 2018-06-07 DIAGNOSIS — E871 Hypo-osmolality and hyponatremia: Secondary | ICD-10-CM | POA: Diagnosis present

## 2018-06-07 DIAGNOSIS — Z96651 Presence of right artificial knee joint: Secondary | ICD-10-CM | POA: Diagnosis present

## 2018-06-07 DIAGNOSIS — R7989 Other specified abnormal findings of blood chemistry: Secondary | ICD-10-CM | POA: Diagnosis not present

## 2018-06-07 DIAGNOSIS — I071 Rheumatic tricuspid insufficiency: Secondary | ICD-10-CM | POA: Diagnosis present

## 2018-06-07 DIAGNOSIS — Z923 Personal history of irradiation: Secondary | ICD-10-CM

## 2018-06-07 DIAGNOSIS — Z79899 Other long term (current) drug therapy: Secondary | ICD-10-CM

## 2018-06-07 DIAGNOSIS — E039 Hypothyroidism, unspecified: Secondary | ICD-10-CM | POA: Diagnosis present

## 2018-06-07 DIAGNOSIS — Z8249 Family history of ischemic heart disease and other diseases of the circulatory system: Secondary | ICD-10-CM

## 2018-06-07 DIAGNOSIS — Z8679 Personal history of other diseases of the circulatory system: Secondary | ICD-10-CM

## 2018-06-07 DIAGNOSIS — I480 Paroxysmal atrial fibrillation: Secondary | ICD-10-CM | POA: Diagnosis present

## 2018-06-07 DIAGNOSIS — R0602 Shortness of breath: Secondary | ICD-10-CM | POA: Diagnosis present

## 2018-06-07 DIAGNOSIS — G988 Other disorders of nervous system: Secondary | ICD-10-CM | POA: Diagnosis not present

## 2018-06-07 DIAGNOSIS — I2510 Atherosclerotic heart disease of native coronary artery without angina pectoris: Secondary | ICD-10-CM | POA: Diagnosis present

## 2018-06-07 DIAGNOSIS — Z955 Presence of coronary angioplasty implant and graft: Secondary | ICD-10-CM

## 2018-06-07 DIAGNOSIS — Z833 Family history of diabetes mellitus: Secondary | ICD-10-CM

## 2018-06-07 DIAGNOSIS — L8990 Pressure ulcer of unspecified site, unspecified stage: Secondary | ICD-10-CM

## 2018-06-07 LAB — CBC WITH DIFFERENTIAL/PLATELET
Abs Immature Granulocytes: 0.02 10*3/uL (ref 0.00–0.07)
BASOS PCT: 0 %
Basophils Absolute: 0 10*3/uL (ref 0.0–0.1)
EOS ABS: 0 10*3/uL (ref 0.0–0.5)
Eosinophils Relative: 1 %
HCT: 47.2 % (ref 39.0–52.0)
Hemoglobin: 15.7 g/dL (ref 13.0–17.0)
Immature Granulocytes: 0 %
Lymphocytes Relative: 7 %
Lymphs Abs: 0.5 10*3/uL — ABNORMAL LOW (ref 0.7–4.0)
MCH: 29.8 pg (ref 26.0–34.0)
MCHC: 33.3 g/dL (ref 30.0–36.0)
MCV: 89.6 fL (ref 80.0–100.0)
Monocytes Absolute: 0.3 10*3/uL (ref 0.1–1.0)
Monocytes Relative: 5 %
Neutro Abs: 6.2 10*3/uL (ref 1.7–7.7)
Neutrophils Relative %: 87 %
PLATELETS: 154 10*3/uL (ref 150–400)
RBC: 5.27 MIL/uL (ref 4.22–5.81)
RDW: 16 % — AB (ref 11.5–15.5)
WBC: 7.1 10*3/uL (ref 4.0–10.5)
nRBC: 0 % (ref 0.0–0.2)

## 2018-06-07 LAB — COMPREHENSIVE METABOLIC PANEL
ALT: 19 U/L (ref 0–44)
AST: 29 U/L (ref 15–41)
Albumin: 3.8 g/dL (ref 3.5–5.0)
Alkaline Phosphatase: 110 U/L (ref 38–126)
Anion gap: 11 (ref 5–15)
BUN: 18 mg/dL (ref 8–23)
CO2: 19 mmol/L — ABNORMAL LOW (ref 22–32)
Calcium: 9 mg/dL (ref 8.9–10.3)
Chloride: 103 mmol/L (ref 98–111)
Creatinine, Ser: 1.09 mg/dL (ref 0.61–1.24)
GFR calc Af Amer: >60 mL/min (ref >60)
GFR calc non Af Amer: >60 mL/min (ref >60)
Glucose, Bld: 145 mg/dL — ABNORMAL HIGH (ref 70–99)
Potassium: 4.4 mmol/L (ref 3.5–5.1)
Sodium: 133 mmol/L — ABNORMAL LOW (ref 135–145)
Total Bilirubin: 0.8 mg/dL (ref 0.3–1.2)
Total Protein: 7.2 g/dL (ref 6.5–8.1)

## 2018-06-07 LAB — PROTIME-INR
INR: 4.18
Prothrombin Time: 39.7 seconds — ABNORMAL HIGH (ref 11.4–15.2)

## 2018-06-07 LAB — TROPONIN I
TROPONIN I: 0.72 ng/mL — AB (ref <0.03)
Troponin I: 0.77 ng/mL (ref <0.03)

## 2018-06-07 LAB — POCT INR: INR: 6.4 — AB (ref 2.0–3.0)

## 2018-06-07 LAB — BRAIN NATRIURETIC PEPTIDE: B Natriuretic Peptide: 1166 pg/mL — ABNORMAL HIGH (ref 0.0–100.0)

## 2018-06-07 MED ORDER — SODIUM CHLORIDE 0.9 % IV SOLN: 250.0000 mL | INTRAVENOUS | Status: DC | PRN

## 2018-06-07 MED ORDER — ALPRAZOLAM 0.25 MG PO TABS: 0.2500 mg | ORAL_TABLET | Freq: Three times a day (TID) | ORAL | Status: DC | PRN

## 2018-06-07 MED ORDER — TAMSULOSIN HCL 0.4 MG PO CAPS
0.4000 mg | ORAL_CAPSULE | Freq: Every morning | ORAL | Status: DC
  Administered 2018-06-08 – 2018-06-15 (×8): 0.4 mg via ORAL
  Filled 2018-06-07 (×8): qty 1

## 2018-06-07 MED ORDER — ACETAMINOPHEN 325 MG PO TABS
325.0000 mg | ORAL_TABLET | Freq: Every day | ORAL | Status: DC | PRN
  Administered 2018-06-12: 650 mg via ORAL

## 2018-06-07 MED ORDER — SODIUM CHLORIDE 0.9% FLUSH: 3.0000 mL | INTRAVENOUS | Status: DC | PRN

## 2018-06-07 MED ORDER — NITROGLYCERIN 0.4 MG SL SUBL: 0.4000 mg | SUBLINGUAL_TABLET | SUBLINGUAL | Status: DC | PRN

## 2018-06-07 MED ORDER — METHYLPREDNISOLONE SODIUM SUCC 125 MG IJ SOLR
125.0000 mg | INTRAMUSCULAR | Status: AC
  Administered 2018-06-07: 125 mg via INTRAVENOUS
  Filled 2018-06-07: qty 2

## 2018-06-07 MED ORDER — BUMETANIDE 1 MG PO TABS
1.0000 mg | ORAL_TABLET | Freq: Every day | ORAL | Status: DC | PRN
  Filled 2018-06-07: qty 1

## 2018-06-07 MED ORDER — SODIUM CHLORIDE 0.9 % IV BOLUS
1000.0000 mL | Freq: Once | INTRAVENOUS | Status: AC
  Administered 2018-06-07: 1000 mL via INTRAVENOUS

## 2018-06-07 MED ORDER — NIACIN ER 500 MG PO TBCR
1500.0000 mg | EXTENDED_RELEASE_TABLET | Freq: Every day | ORAL | Status: DC
  Filled 2018-06-07 (×4): qty 3

## 2018-06-07 MED ORDER — SODIUM CHLORIDE 0.9% FLUSH
3.0000 mL | Freq: Two times a day (BID) | INTRAVENOUS | Status: DC
  Administered 2018-06-08: 10 mL via INTRAVENOUS
  Administered 2018-06-08 – 2018-06-14 (×14): 3 mL via INTRAVENOUS

## 2018-06-07 MED ORDER — METHYLPREDNISOLONE SODIUM SUCC 125 MG IJ SOLR
80.0000 mg | Freq: Two times a day (BID) | INTRAMUSCULAR | Status: DC
  Administered 2018-06-08 – 2018-06-09 (×4): 80 mg via INTRAVENOUS
  Filled 2018-06-07 (×5): qty 2

## 2018-06-07 MED ORDER — IPRATROPIUM-ALBUTEROL 0.5-2.5 (3) MG/3ML IN SOLN
3.0000 mL | Freq: Four times a day (QID) | RESPIRATORY_TRACT | Status: DC
  Administered 2018-06-08 – 2018-06-09 (×7): 3 mL via RESPIRATORY_TRACT
  Filled 2018-06-07 (×7): qty 3

## 2018-06-07 MED ORDER — DOXYCYCLINE HYCLATE 100 MG PO TABS
100.0000 mg | ORAL_TABLET | Freq: Two times a day (BID) | ORAL | Status: AC
  Administered 2018-06-08 – 2018-06-12 (×10): 100 mg via ORAL
  Filled 2018-06-07 (×10): qty 1

## 2018-06-07 MED ORDER — ALBUTEROL SULFATE (2.5 MG/3ML) 0.083% IN NEBU
2.5000 mg | INHALATION_SOLUTION | RESPIRATORY_TRACT | Status: DC | PRN
  Filled 2018-06-07: qty 3

## 2018-06-07 MED ORDER — METOPROLOL TARTRATE 12.5 MG HALF TABLET
12.5000 mg | ORAL_TABLET | Freq: Two times a day (BID) | ORAL | Status: DC
  Administered 2018-06-08 – 2018-06-13 (×11): 12.5 mg via ORAL
  Filled 2018-06-07 (×13): qty 1

## 2018-06-07 MED ORDER — DONEPEZIL HCL 5 MG PO TABS
10.0000 mg | ORAL_TABLET | Freq: Every day | ORAL | Status: DC
  Administered 2018-06-08 – 2018-06-14 (×8): 10 mg via ORAL
  Filled 2018-06-07 (×9): qty 2

## 2018-06-07 MED ORDER — IOPAMIDOL (ISOVUE-370) INJECTION 76%
100.0000 mL | Freq: Once | INTRAVENOUS | Status: AC | PRN
  Administered 2018-06-07: 100 mL via INTRAVENOUS

## 2018-06-07 NOTE — ED Notes (Signed)
ED Provider at bedside. 

## 2018-06-07 NOTE — ED Notes (Signed)
Date and time results received: 06/07/18 1852  Test: trop Critical Value: 0.72  Name of Provider Notified: [facility]  Orders Received? Or Actions Taken?: see chart

## 2018-06-07 NOTE — Progress Notes (Signed)
   Subjective:    Patient ID: [name] [name], male    DOB: 1940/09/09, 77 y.o.   MRN: [id number]  Shortness of Breath  This is a new problem. Episode onset: 5 -10days. Associated symptoms include wheezing.   Diarrhea, weakness, loss of appetitie  Checked 02 at check in 68-74. Gave pt 2 liters of oxygen.   Results for orders placed or performed in visit on 06/07/18  POCT INR  Result Value Ref Range   INR 6.4 (A) 2.0 - 3.0   Has lost more weight  Took meds for the thrush    apetite is dow   Appetite has dropped off    Boost adding to diarrhea    Sob progrssive in nature    Breathing gets worse with exdrtion      Review of Systems  Respiratory: Positive for shortness of breath and wheezing.        Objective:   Physical Exam  Patient is alert, moderate malaise, substantial respiratory distress.  Breathing somewhat rapidly.  Nasal cannula O2 on 2 L.  O2 sats still in the low 80s.  Weight has dropped 4 more pounds since 10 days ago lungs diminished breath sounds but no wheezes or crackles.  Positive tachypnea heart mild tachycardia abdomen no discrete tenderness question diminished skin turgor      Assessment & Plan:  Impression worrisome progressive dyspnea severe hypoxia in this patient with known lung cancer.  Also known history of pulmonary emboli.  INR up today substantially.  Also known heart disease.  Patient at risk for heart failure.  Risk for pulmonary emboli at low risk for worsening lung cancer.  At risk for pneumonia.  Long discussion with family and patient.  Patient needs urgent work-up and stabilization and  hospitalization discussed.  I also called and spoke to Dr. Thurnell Garbe the ER doctor about her concerns.  Patient to head to [facility] ER for urgent intervention  Greater than 50% of this 40 minute face to face visit was spent in counseling and discussion and coordination of care regarding the above diagnosis/diagnosies

## 2018-06-07 NOTE — ED Triage Notes (Signed)
Pt sent from Dr. Lance Sell office due to hypoxia, RA sats 66% on arrival.  Pt does not wear home O2.  Pt states sob for a week.

## 2018-06-07 NOTE — ED Provider Notes (Signed)
[facility] EMERGENCY DEPARTMENT Provider Note   CSN: [id number] Arrival date & time: 06/07/18  1644     History   Chief Complaint Chief Complaint  Patient presents with  . Shortness of Breath    HPI [name] [name] is a 77 y.o. male.  HPI Patient presents with concern for hypoxia, weakness. Patient is here with 2 family members who assist with the HPI. Patient is a former smoker, has history of lung cancer. Patient had radiation therapy, chemotherapy 1 year ago, and recently completed a 1 year course of immunotherapy.  Family notes that over the past week or so the patient has become more weak, with substantial dyspnea, fatigue with exertion, but with no chest pain, no syncope, no fever. There is associated nausea, anorexia, weight loss. Patient went to his physician's office today He was found to be hypoxic, with a saturation of 60% on room air. He was sent here for evaluation.  Past Medical History:  Diagnosis Date  . Adenocarcinoma of right lung, stage 3 ([place]) 09/20/2016  . Atrial fibrillation ([place])    no hx of reported at preop visit of 04/21/11   . CAD (coronary artery disease)   . Carotid artery occlusion    left carotid endarterectomy  . Chronic kidney disease    AAA repair with reimplant of renals   . CHRONIC OBSTRUCTIVE PULMONARY DISEASE    pt denied at visit of 04/21/11   . CORONARY ARTERY DISEASE   . Dehydration 11/15/2016  . Disorder of left sacroiliac joint 08/24/2016  . DIVERTICULOSIS OF COLON   . Encounter for antineoplastic chemotherapy 10/17/2016  . GERD   . H/O hiatal hernia   . Headache(784.0)    Hx: of years of years  . HYPERTENSION   . HYPOTHYROIDISM   . JOINT EFFUSION, KNEE   . KNEE, ARTHRITIS, DEGEN./OSTEO    right knee   . Myocardial infarction ([place])    1994   . NEPHROLITHIASIS, HX OF   . OSTEOPOROSIS   . Other dysphagia   . PERIPHERAL VASCULAR DISEASE    AAA - 1994 with reimplant of renals   . Peripheral vascular disease ([place])    subclavian stenosis PTA - 3/08   . Pure hypercholesterolemia   . Renal artery stenosis [facility])     Patient Active Problem List   Diagnosis Date Noted  . Acute respiratory failure with hypoxia ([place]) 06/07/2018  . Dementia, neurological ([place]) 02/14/2017  . Encounter for antineoplastic immunotherapy 01/02/2017  . Goals of care, counseling/discussion 01/02/2017  . Cerebrovascular accident (CVA) due to thrombosis of right carotid artery ([place]) 12/20/2016  . Cerebrovascular accident (CVA) due to embolism of cerebral artery ([place]) 12/20/2016  . Memory loss 12/20/2016  . Dehydration 11/15/2016  . Encounter for antineoplastic chemotherapy 10/17/2016  . Malnutrition of moderate degree 10/06/2016  . Urinary tract infection 10/03/2016  . T1b N2 M0 disease (stage IIIA).adenocarcinoma of the right middle lobe of lung ([place]) 09/20/2016  . Peripheral edema   . Slow transit constipation   . Acute lower UTI   . Confusion   . SOB (shortness of breath) 09/02/2016  . Urinary incontinence   . Abnormal urinalysis   . Leukocytosis   . HCAP (healthcare-associated pneumonia)   . Hypoalbuminemia due to protein-calorie malnutrition ([place])   . Hypokalemia   . Hyponatremia   . Urinary retention   . Sleep disturbance   . Acute bilateral deep vein thrombosis (DVT) of popliteal veins (HCC)   . Brain mass 08/31/2016  .  Debility   . Benign essential HTN   . Scrotal edema   . Hyperlipidemia   . Constipation due to pain medication   . Fall from height of greater than 3 feet   . Acute blood loss anemia   . Lung mass   . Post-operative pain   . PVD (peripheral vascular disease) ([place])   . Coronary artery disease involving coronary bypass graft of native heart without angina pectoris   . PAF (paroxysmal atrial fibrillation) ([place])   . Disorder of left sacroiliac joint 08/24/2016  . Fall 08/22/2016  . Closed pelvic ring fracture ([place])   . Carotid artery stenosis, asymptomatic 08/12/2014  . Ejection murmur  07/03/2014  . Aftercare following surgery of the circulatory system, [place] 02/11/2014  . Weakness-Abdomin and Bilateral Thigh / Leg 02/11/2014  . Carotid stenosis 07/19/2013  . Occlusion and stenosis of carotid artery without mention of cerebral infarction 07/16/2013  . Impaired fasting glucose 07/14/2013  . Knee pain 05/23/2011  . Stiffness of joint, not elsewhere classified, lower leg 05/23/2011  . Muscle weakness (generalized) 05/23/2011  . Difficulty in walking(719.7) 05/23/2011  . Bruit 11/19/2010  . AAA (abdominal aortic aneurysm) ([name]) 11/19/2010  . OTHER DYSPHAGIA 04/27/2010  . ATRIAL FIBRILLATION 12/10/2008  . JOINT EFFUSION, KNEE 01/14/2008  . KNEE PAIN 01/14/2008  . KNEE, ARTHRITIS, DEGEN./OSTEO 12/11/2007  . Hypothyroidism 10/10/2007  . Pure hypercholesterolemia 10/10/2007  . Essential hypertension 10/10/2007  . Coronary atherosclerosis 10/10/2007  . Peripheral vascular disease ([place]) 10/10/2007  . COPD with acute exacerbation ([place]) 10/10/2007  . GERD 10/10/2007  . DIVERTICULOSIS OF COLON 10/10/2007  . OSTEOPOROSIS 10/10/2007  . NEPHROLITHIASIS, HX OF 10/10/2007  . Personal history of surgery to heart and great vessels, presenting hazards to health 10/10/2007  . CHOLECYSTECTOMY, HX OF 10/10/2007  . CORONARY ARTERY BYPASS GRAFT, HX OF 10/10/2007    Past Surgical History:  Procedure Laterality Date  . ABDOMINAL AORTIC ANEURYSM REPAIR     with reimplantation of renals   . CARDIAC CATHETERIZATION     2008  . CAROTID ENDARTERECTOMY Left Jan. 30, 2015   CEA  . CHOLECYSTECTOMY  2000   Gall Bladder  . COLONOSCOPY W/ BIOPSIES AND POLYPECTOMY     Hx: of  . CORONARY ARTERY BYPASS GRAFT     1995  . CORONARY STENT PLACEMENT     Hx: of  . ENDARTERECTOMY Left 07/19/2013   Procedure: ENDARTERECTOMY CAROTID;  Surgeon: Mal Misty, MD;  Location: [place];  Service: Vascular;  Laterality: Left;  . GALLBLADDER SURGERY  2004   . JOINT REPLACEMENT     partial knee replacement  on left 2002   . KNEE SURGERY    . ORIF PELVIC FRACTURE Left 08/25/2016   Procedure: OPEN REDUCTION INTERNAL FIXATION (ORIF) PELVIC FRACTURE; plate on front, SI screw on the back;  Surgeon: Altamese Metuchen, MD;  Location: [place];  Service: Orthopedics;  Laterality: Left;  . OTHER SURGICAL HISTORY     left subclavian stenosis surgery PTA 08/2006   . OTHER SURGICAL HISTORY     carotid surgery on right 2004   . TOTAL KNEE ARTHROPLASTY  04/29/2011   Procedure: TOTAL KNEE ARTHROPLASTY;  Surgeon: Gearlean Alf;  Location: [facility];  Service: Orthopedics;  Laterality: Right;  . urinary retention    . VASCULAR SURGERY     AAA  . VASECTOMY  1973  . VIDEO BRONCHOSCOPY WITH ENDOBRONCHIAL ULTRASOUND [name] 09/09/2016   Procedure: VIDEO BRONCHOSCOPY WITH ENDOBRONCHIAL ULTRASOUND;  Surgeon: Grace Isaac,  MD;  Location: [facility];  Service: Thoracic;  Laterality: [name];        Home Medications    Prior to Admission medications   Medication Sig Start Date End Date Taking? Authorizing Provider  acetaminophen (TYLENOL) 325 MG tablet Take 325-650 mg by mouth daily as needed for headache (for pain).    Yes [provider]  bumetanide (BUMEX) 1 MG tablet Take 1 tablet (1 mg total) by mouth daily as needed (swelling). 10/07/16  Yes Kathie Dike, MD  donepezil (ARICEPT) 10 MG tablet Take 1 tablet daily Patient taking differently: Take 10 mg by mouth at bedtime.  04/03/18  Yes Cameron Sprang, MD  lansoprazole (PREVACID) 15 MG capsule Take 1 capsule (15 mg total) by mouth daily. Patient taking differently: Take 15 mg by mouth every morning.  09/16/16  Yes Angiulli, Lavon Paganini, PA-C  levothyroxine (SYNTHROID, LEVOTHROID) 112 MCG tablet Take 1 tablet (112 mcg total) by mouth daily. Patient taking differently: Take 112 mcg by mouth every morning.  08/17/17  Yes [name], MD  linaclotide [facility]) 145 MCG CAPS capsule Take 1 capsule (145 mcg total) by mouth daily before breakfast. Patient taking  differently: Take 145 mcg by mouth daily as needed (for constipation).  08/17/17  Yes [name], MD  magic mouthwash w/lidocaine SOLN Composition: 80 cc each of Extra St. Maalox Plus, Diphenhydramine, and Nystatin. Plus 240 cc of 2% viscous lidocaine to make 480 cc. Swish and swallow 2 tsp four times per day. 12/19/16  Yes Tyler Pita, MD  metoprolol tartrate (LOPRESSOR) 25 MG tablet TAKE ONE-HALF TABLET BY MOUTH TWO TIMES DAILY Patient taking differently: Take 12.5 mg by mouth 2 (two) times daily.  06/06/18  Yes [name], MD  niacin (SLO-NIACIN) 500 MG tablet Take 3 tablets (1,500 mg total) by mouth at bedtime. 09/16/16  Yes Angiulli, Lavon Paganini, PA-C  nitroGLYCERIN (NITROSTAT) 0.4 MG SL tablet Place 1 tablet (0.4 mg total) under the tongue every 5 (five) minutes as needed for chest pain. 09/16/16  Yes Angiulli, Lavon Paganini, PA-C  potassium chloride SA (K-DUR,KLOR-CON) 20 MEQ tablet Take 1 tablet (20 mEq total) by mouth daily. Patient taking differently: Take 20 mEq by mouth at bedtime.  08/17/17  Yes [name], MD  rosuvastatin (CRESTOR) 20 MG tablet Take 1 tablet (20 mg total) by mouth daily. Patient taking differently: Take 20 mg by mouth at bedtime.  01/05/18  Yes [name], MD  tamsulosin (FLOMAX) 0.4 MG CAPS capsule TAKE ONE CAPSULE BY MOUTH TWICE A DAY. Patient taking differently: Take 0.4 mg by mouth every morning.  09/07/17  Yes [name], MD  warfarin (COUMADIN) 5 MG tablet TAKE ONE TABLET ON MONDAY WEDNESDAY AND SATURDAY, TAKE ONE AND ONE-HALF ON SUNDAY TUESDAY [place] Patient taking differently: Take 5 mg by mouth See admin instructions.  04/19/18  Yes [name], MD  ALPRAZolam Duanne Moron) 0.25 MG tablet Take 1 tablet (0.25 mg total) by mouth 3 (three) times daily as needed for anxiety. Patient not taking: Reported on 06/07/2018 09/16/16   Angiulli, Lavon Paganini, PA-C  methylPREDNISolone (MEDROL DOSEPAK) 4 MG TBPK tablet Use as instructed. Patient not  taking: Reported on 06/07/2018 05/10/18   Curt Bears, MD  nystatin (MYCOSTATIN) 100000 UNIT/ML suspension TAKE 5 ML (500,000 UNITS TOTAL) BY MOUTHFOUR TIMES DAILY. SWISH AND SWALLOW. Patient not taking: Reported on 06/07/2018 05/22/18   Maryanna Shape, NP  potassium chloride SA (K-DUR,KLOR-CON) 20 MEQ tablet TAKE ONE TABLET (20MEQ  TOTAL) BY MOUTH DAILY Patient not taking: Reported on 06/07/2018 05/31/18   [name], MD    Family History Family History  Problem Relation Age of Onset  . Hypertension Mother   . Stroke Mother   . Heart disease Father   . Heart attack Father   . Diabetes Sister   . Cancer Brother 25       Lung  . Diabetes Sister     Social History Social History   Tobacco Use  . Smoking status: Former Smoker    Types: Cigarettes    Last attempt to quit: 06/20/1992    Years since quitting: 25.9  . Smokeless tobacco: Never Used  Substance Use Topics  . Alcohol use: No    Alcohol/week: 0.0 standard drinks  . Drug use: No     Allergies   Neurontin [gabapentin]; Imitrex [sumatriptan]; and Lipitor [atorvastatin]   Review of Systems Review of Systems  Constitutional:       Per HPI, otherwise negative  HENT:       Per HPI, otherwise negative  Respiratory:       Per HPI, otherwise negative  Cardiovascular:       Per HPI, otherwise negative  Gastrointestinal: Positive for nausea. Negative for vomiting.  Endocrine:       Negative aside from HPI  Genitourinary:       Neg aside from HPI   Musculoskeletal:       Per HPI, otherwise negative  Skin: Negative.   Allergic/Immunologic: Positive for immunocompromised state.  Neurological: Positive for weakness. Negative for syncope.     Physical Exam Updated Vital Signs BP (!) 149/79   Pulse 82   Temp (!) 97.5 F (36.4 C) (Axillary)   Resp (!) 28   Ht 5\' 7"  (1.702 m)   Wt 55.8 kg   SpO2 98%   BMI 19.26 kg/m   Physical Exam Vitals signs and nursing note reviewed.  Constitutional:       General: He is not in acute distress.    Comments: Ill-appearing elderly male  HENT:     Head: Normocephalic and atraumatic.  Eyes:     Conjunctiva/sclera: Conjunctivae normal.  Cardiovascular:     Rate and Rhythm: Normal rate and regular rhythm.  Pulmonary:     Effort: Tachypnea present.     Breath sounds: Decreased breath sounds present.  Abdominal:     General: There is no distension.  Skin:    General: Skin is warm and dry.  Neurological:     Mental Status: He is oriented to person, place, and time.      ED Treatments / Results  Labs (all labs ordered are listed, but only abnormal results are displayed) Labs Reviewed  COMPREHENSIVE METABOLIC PANEL - Abnormal; Notable for the following components:      Result Value   Sodium 133 (*)    CO2 19 (*)    Glucose, Bld 145 (*)    All other components within normal limits  CBC WITH DIFFERENTIAL/PLATELET - Abnormal; Notable for the following components:   RDW 16.0 (*)    Lymphs Abs 0.5 (*)    All other components within normal limits  TROPONIN I - Abnormal; Notable for the following components:   Troponin I 0.72 (*)    All other components within normal limits  BRAIN NATRIURETIC PEPTIDE - Abnormal; Notable for the following components:   B Natriuretic Peptide 1,166.0 (*)    All other components within normal limits  PROTIME-INR -  Abnormal; Notable for the following components:   Prothrombin Time 39.7 (*)    INR 4.18 (*)    All other components within normal limits  BASIC METABOLIC PANEL  CBC WITH DIFFERENTIAL/PLATELET  TROPONIN I  TROPONIN I  TROPONIN I    EKG EKG Interpretation  Date/Time:  Thursday June 07 2018 16:53:47 EST Ventricular Rate:  117 PR Interval:    QRS Duration: 144 QT Interval:  360 QTC Calculation: 476 R Axis:   -59 Text Interpretation:  Atrial flutter Ventricular premature complex IVCD, consider atypical RBBB LVH with IVCD and secondary repol abnrm Borderline prolonged QT interval Baseline  wander in lead(s) II III aVR aVL aVF V5 Abnormal ekg Confirmed by Carmin Muskrat [phone]) on 06/07/2018 5:34:41 PM   Radiology Dg Chest 2 View  Result Date: 06/07/2018 CLINICAL DATA:  Hypoxia.  Shortness of breath. EXAM: CHEST - 2 VIEW COMPARISON:  CT scan May 08, 2017 FINDINGS: Opacity in the right perihilar region is consistent with post therapeutic/radiation changes and is similar since November 2019. No pneumothorax. The cardiomediastinal silhouette is stable. No new nodules or masses. Mild scarring in the left base. No new suspicious infiltrate. IMPRESSION: Post therapeutic changes on the right.  No acute interval changes. Electronically Signed   By: Dorise Bullion III M.D   On: 06/07/2018 17:28   Ct Angio Chest Pe W/cm &/or Wo Cm  Result Date: 06/07/2018 CLINICAL DATA:  Hypoxia. Shortness of breath for the past week. Right lung cancer. EXAM: CT ANGIOGRAPHY CHEST WITH CONTRAST TECHNIQUE: Multidetector CT imaging of the chest was performed using the standard protocol during bolus administration of intravenous contrast. Multiplanar CT image reconstructions and MIPs were obtained to evaluate the vascular anatomy. CONTRAST:  165mL ISOVUE-370 IOPAMIDOL (ISOVUE-370) INJECTION 76% COMPARISON:  CT chest dated May 08, 2018. FINDINGS: Cardiovascular: Satisfactory opacification of the pulmonary arteries to the segmental level. No evidence of pulmonary embolism. Stable cardiomegaly. No pericardial effusion. Prior CABG. No thoracic aortic aneurysm. Evaluation for aortic dissection is limited due to phase of contrast. Coronary, aortic arch, and branch vessel atherosclerotic vascular disease. 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 Mediastinum/Nodes: Stable right low jugular and paratracheal lymphadenopathy measuring up to 16 mm. Unchanged 9 mm subcarinal lymph node. Unchanged 10 mm left hilar lymph nodes. No axillary  lymphadenopathy. The thyroid gland, trachea, and esophagus demonstrate no significant findings. Lungs/Pleura: Moderate centrilobular emphysema again noted. Unchanged 5 mm pulmonary nodule in the right upper lobe. Previously seen 5 mm nodule in the inferior right upper lobe is no longer identified. Unchanged geographic consolidation and traction bronchiectasis centered about the medial and perihilar right lung. Unchanged chronic right pleural thickening. No pneumothorax, pleural effusion or new airspace consolidation. Upper Abdomen: No acute abnormality. Musculoskeletal: No chest wall abnormality. No acute or significant osseous findings. Chronic T6 and T8 mild compression deformities are unchanged. Review of the MIP images confirms the above findings. IMPRESSION: 1. No evidence of pulmonary embolism. No acute intrathoracic process. 2. Unchanged right perihilar radiation fibrosis. 3. Stable lower cervical and right paratracheal lymphadenopathy. 4. Previously seen new 5 mm pulmonary nodule in the right upper lobe is no longer identified and was likely inflammatory or infectious. 5. 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 6.  Emphysema (ICD10-J43.9). 7.  Aortic atherosclerosis (ICD10-I70.0). Electronically Signed   By: Titus Dubin M.D.   On: 06/07/2018 21:32  Procedures Procedures (including critical care time)  Medications Ordered in ED Medications  acetaminophen (TYLENOL) tablet 325-650 mg (has no administration in time range)  ALPRAZolam (XANAX) tablet 0.25 mg (has no administration in time range)  nitroGLYCERIN (NITROSTAT) SL tablet 0.4 mg (has no administration in time range)  niacin (SLO-NIACIN) CR tablet 1,500 mg (has no administration in time range)  bumetanide (BUMEX) tablet 1 mg (has no administration in time range)  tamsulosin (FLOMAX) capsule 0.4 mg (has no administration in time range)  donepezil (ARICEPT)  tablet 10 mg (has no administration in time range)  metoprolol tartrate (LOPRESSOR) tablet 12.5 mg (has no administration in time range)  sodium chloride flush (NS) 0.9 % injection 3 mL (has no administration in time range)  sodium chloride flush (NS) 0.9 % injection 3 mL (has no administration in time range)  0.9 %  sodium chloride infusion (has no administration in time range)  doxycycline (VIBRA-TABS) tablet 100 mg (has no administration in time range)  albuterol (PROVENTIL) (2.5 MG/3ML) 0.083% nebulizer solution 2.5 mg (has no administration in time range)  ipratropium-albuterol (DUONEB) 0.5-2.5 (3) MG/3ML nebulizer solution 3 mL (has no administration in time range)  methylPREDNISolone sodium succinate (SOLU-MEDROL) 125 mg/2 mL injection 80 mg (has no administration in time range)  sodium chloride 0.9 % bolus 1,000 mL (0 mLs Intravenous Stopped 06/07/18 2031)  iopamidol (ISOVUE-370) 76 % injection 100 mL (100 mLs Intravenous Contrast Given 06/07/18 2027)  methylPREDNISolone sodium succinate (SOLU-MEDROL) 125 mg/2 mL injection 125 mg (125 mg Intravenous Given 06/07/18 2217)     Initial Impression / Assessment and Plan / ED Course  I have reviewed the triage vital signs and the nursing notes.  Pertinent labs & imaging results that were available during my care of the patient were reviewed by me and considered in my medical decision making (see chart for details).    Update:, Patient improved with supplemental oxygen. Initial labs notable for slight elevation in troponin, elevated BNP. Chart review notable for no history of congestive heart failure.   Update: Family aware of findings thus far, including elevated BNP, troponin, CT scan with no evidence for PE, slight diminishment of the size of focal tumor. Patient is now on a Ventimask, saturating 100%, with unremarkable other vital signs, improved tachycardia, tachypnea He has received supplemental fluids, as he was dehydrated  clinically on exam, has no history of heart failure. With substantial improvement, clinically, there was concern for his hypoxia, increased work of breathing, or respiratory distress on presentation, which is most consistent with COPD exacerbation, with consideration of elevated BNP as well, the patient will require admission for further evaluation and management.   Final Clinical Impressions(s) / ED Diagnoses  Respiratory distress Hypoxia  CRITICAL CARE Performed by: Carmin Muskrat Total critical care time: 40 minutes Critical care time was exclusive of separately billable procedures and treating other patients. Critical care was necessary to treat or prevent imminent or life-threatening deterioration. Critical care was time spent personally by me on the following activities: development of treatment plan with patient and/or surrogate as well as nursing, discussions with consultants, evaluation of patient's response to treatment, examination of patient, obtaining history from patient or surrogate, ordering and performing treatments and interventions, ordering and review of laboratory studies, ordering and review of radiographic studies, pulse oximetry and re-evaluation of patient's condition.    Carmin Muskrat, MD 06/07/18 2342

## 2018-06-08 ENCOUNTER — Inpatient Hospital Stay (HOSPITAL_COMMUNITY): Payer: Medicare Other

## 2018-06-08 ENCOUNTER — Ambulatory Visit: Payer: Medicare Other

## 2018-06-08 DIAGNOSIS — R7989 Other specified abnormal findings of blood chemistry: Secondary | ICD-10-CM

## 2018-06-08 DIAGNOSIS — I214 Non-ST elevation (NSTEMI) myocardial infarction: Secondary | ICD-10-CM

## 2018-06-08 DIAGNOSIS — I361 Nonrheumatic tricuspid (valve) insufficiency: Secondary | ICD-10-CM

## 2018-06-08 DIAGNOSIS — J441 Chronic obstructive pulmonary disease with (acute) exacerbation: Secondary | ICD-10-CM

## 2018-06-08 DIAGNOSIS — F0280 Dementia in other diseases classified elsewhere without behavioral disturbance: Secondary | ICD-10-CM

## 2018-06-08 DIAGNOSIS — J9601 Acute respiratory failure with hypoxia: Principal | ICD-10-CM

## 2018-06-08 DIAGNOSIS — G988 Other disorders of nervous system: Secondary | ICD-10-CM

## 2018-06-08 DIAGNOSIS — I10 Essential (primary) hypertension: Secondary | ICD-10-CM

## 2018-06-08 LAB — BASIC METABOLIC PANEL
ANION GAP: 9 (ref 5–15)
BUN: 16 mg/dL (ref 8–23)
CALCIUM: 8.6 mg/dL — AB (ref 8.9–10.3)
CO2: 16 mmol/L — ABNORMAL LOW (ref 22–32)
Chloride: 106 mmol/L (ref 98–111)
Creatinine, Ser: 0.85 mg/dL (ref 0.61–1.24)
GFR calc non Af Amer: >60 mL/min (ref >60)
Glucose, Bld: 126 mg/dL — ABNORMAL HIGH (ref 70–99)
Potassium: 4.5 mmol/L (ref 3.5–5.1)
Sodium: 131 mmol/L — ABNORMAL LOW (ref 135–145)

## 2018-06-08 LAB — CBC WITH DIFFERENTIAL/PLATELET
Abs Immature Granulocytes: 0.02 10*3/uL (ref 0.00–0.07)
Basophils Absolute: 0 10*3/uL (ref 0.0–0.1)
Basophils Relative: 0 %
Eosinophils Absolute: 0 10*3/uL (ref 0.0–0.5)
Eosinophils Relative: 0 %
HEMATOCRIT: 44.8 % (ref 39.0–52.0)
Hemoglobin: 15.3 g/dL (ref 13.0–17.0)
Immature Granulocytes: 0 %
Lymphocytes Relative: 7 %
Lymphs Abs: 0.3 10*3/uL — ABNORMAL LOW (ref 0.7–4.0)
MCH: 30.5 pg (ref 26.0–34.0)
MCHC: 34.2 g/dL (ref 30.0–36.0)
MCV: 89.2 fL (ref 80.0–100.0)
Monocytes Absolute: 0 10*3/uL — ABNORMAL LOW (ref 0.1–1.0)
Monocytes Relative: 1 %
NEUTROS PCT: 92 %
Neutro Abs: 4.2 10*3/uL (ref 1.7–7.7)
Platelets: 116 10*3/uL — ABNORMAL LOW (ref 150–400)
RBC: 5.02 MIL/uL (ref 4.22–5.81)
RDW: 15.6 % — ABNORMAL HIGH (ref 11.5–15.5)
WBC: 4.7 10*3/uL (ref 4.0–10.5)
nRBC: 0 % (ref 0.0–0.2)

## 2018-06-08 LAB — MRSA PCR SCREENING: MRSA BY PCR: NEGATIVE

## 2018-06-08 LAB — ECHOCARDIOGRAM COMPLETE
Height: 67 in
Weight: 1918.88 oz

## 2018-06-08 LAB — TROPONIN I
Troponin I: 0.6 ng/mL (ref <0.03)
Troponin I: 0.56 ng/mL (ref <0.03)

## 2018-06-08 MED ORDER — ROSUVASTATIN CALCIUM 20 MG PO TABS
20.0000 mg | ORAL_TABLET | Freq: Every day | ORAL | Status: DC
  Administered 2018-06-08 – 2018-06-14 (×7): 20 mg via ORAL
  Filled 2018-06-08 (×7): qty 1

## 2018-06-08 MED ORDER — ENSURE ENLIVE PO LIQD
237.0000 mL | Freq: Two times a day (BID) | ORAL | Status: DC
  Administered 2018-06-08: 237 mL via ORAL

## 2018-06-08 MED ORDER — ENSURE ENLIVE PO LIQD
237.0000 mL | Freq: Two times a day (BID) | ORAL | Status: DC
  Administered 2018-06-09 – 2018-06-15 (×9): 237 mL via ORAL

## 2018-06-08 MED ORDER — SODIUM CHLORIDE 0.9 % IV SOLN: INTRAVENOUS | Status: DC

## 2018-06-08 MED ORDER — ADULT MULTIVITAMIN W/MINERALS CH
1.0000 | ORAL_TABLET | Freq: Every day | ORAL | Status: DC
  Administered 2018-06-08 – 2018-06-15 (×7): 1 via ORAL
  Filled 2018-06-08 (×6): qty 1

## 2018-06-08 MED ORDER — ASPIRIN EC 81 MG PO TBEC
81.0000 mg | DELAYED_RELEASE_TABLET | Freq: Every day | ORAL | Status: DC
  Administered 2018-06-08 – 2018-06-15 (×8): 81 mg via ORAL
  Filled 2018-06-08 (×8): qty 1

## 2018-06-08 NOTE — Progress Notes (Signed)
*  PRELIMINARY RESULTS* Echocardiogram 2D Echocardiogram has been performed.  Leavy Cella 06/08/2018, 12:22 PM

## 2018-06-08 NOTE — Progress Notes (Signed)
Echo shows drop in LVEF to 35-40%. Significant enlargement of RV with decreased function compared to prior study 12/2016, elevation of PA pressure. CT PE was negative.    Chemo history shows carboplatin and paclitaxel 09/2016 to 11/2016. . Had consolidation therapy with durvalumab first dose 01/2017. I dont see a strong association with cardiotoxicity for any of these agents.   Does not appear volume overloaded by exam or imaging despite BNP, by weights down 4 kg since 04/2018 appt if weights are accurate.   Patient will require LHC/RHC this admission once INR has come down and respiratory status improved. If INR <2 then start hep gtt.  Soft bp's this AM, if improve would change his lopressor to Toprol. Pending bp's may try to add low dose ACE-I vs entresto.   Carlyle Dolly MD

## 2018-06-08 NOTE — Progress Notes (Signed)
Per HPI: [name] [name] is a 77 y.o. male with medical history significant of lung cancer, COPD, coronary artery disease, A. fib, peripheral vascular disease which he sees primary care physician today for 1 week of progressive worsening shortness of breath and dyspnea on exertion.  He denies any chest pain.  He says he has been coughing a lot but not having any fevers.  He denies any lower extremity edema or swelling.  He is currently off of his treatment for his cancer and is supposed to follow-up with his oncologist in January to resume any treatment if needed.  He just completed a year of immune O therapy.  He does not currently smoke on arrival here he was wheezing.  His O2 sats were in the 60s with his primary care physician's office were also low here on arrival at 66% on room air.  He does not require supplemental oxygen at home.  Patient is been given nebulizer treatment Solu-Medrol and oxygen supplementation and says he is feeling markedly better.  He also has a troponin of 0.72.  He has not been eating well over the last week secondary to his shortness of breath.  Patient is being referred for admission for acute hypoxic respiratory failure secondary to COPD exacerbation.  Patient seen and evaluated and cardiology consulted due to troponin elevations with unclear history of chest pain related to the shortness of breath due to severe dementia.  Appreciate recommendations with 2D echocardiogram pending at this time.  Patient is not a candidate for catheterization given elevated INR level, nor could heparin drip be initiated until INR levels diminished.  He appears to be slowly doing better on IV steroids and breathing treatments which will be continued at this time.  Wean oxygen as tolerated and follow-up lab work in the morning.  Will consider transfer to general medical floor once oxygen requirement start to diminish.

## 2018-06-08 NOTE — Plan of Care (Signed)
  Problem: Acute Rehab PT Goals(only PT should resolve) Goal: Pt Will Go Supine/Side To Sit Outcome: Progressing Flowsheets (Taken 06/08/2018 1225) Pt will go Supine/Side to Sit: with modified independence Goal: Patient Will Transfer Sit To/From Stand Outcome: Progressing Flowsheets (Taken 06/08/2018 1225) Patient will transfer sit to/from stand: with min guard assist Goal: Pt Will Transfer Bed To Chair/Chair To Bed Outcome: Progressing Flowsheets (Taken 06/08/2018 1225) Pt will Transfer Bed to Chair/Chair to Bed: min guard assist Goal: Pt Will Ambulate Outcome: Progressing Flowsheets (Taken 06/08/2018 1225) Pt will Ambulate: 50 feet; with min guard assist; with rolling walker   12:26 PM, 06/08/18 Lonell Grandchild, MPT Physical Therapist with [facility] [phone] office [phone] mobile phone

## 2018-06-08 NOTE — Progress Notes (Signed)
Initial Nutrition Assessment  DOCUMENTATION CODES:  Severe malnutrition in context of acute illness/injury  INTERVENTION:  Continue Ensure Enlive po BID, each supplement provides 350 kcal and 20 grams of protein  Add Liquid MVI with minerals  Provided personalized diet education to pt/family regarding appropriate diet and wt gain/maintenance   NUTRITION DIAGNOSIS:  Severe Malnutrition(Acute) related to acute illness(Thrush, gastroenteritis, Nstemi in 1 month) as evidenced by loss of >5% bw in 1 month and moderate muscle/fat wasting  GOAL:  Patient will meet greater than or equal to 90% of their needs  MONITOR:  PO intake, Supplement acceptance, Labs, Weight trends, Diet advancement  REASON FOR ASSESSMENT:  Consult COPD Protocol  ASSESSMENT:  77 y/o male PMHx COPD, CAD s/p cabg, Afib, PVD, MI, CKD3, Dementia and stage III Lung cancer s/p chemoradiation-just completing a year course of oral immunotherapy. Currently only receiving observation. Presented to ED from PCP office d/t hypoxia. Reports associated nausea, anorexia and weight loss. Workup showed elevated Troponin.  Admitted for acute resp failure 2/2 copd exacerbation and possible nstemi.    Pt evaluated by cardiology and felt to have suffered a Type II Nstemi.   On RD arrival, several family members present. Pt has mild-moderate dementia and as such, family provided majority of history. Per family, pt has experienced a sharp decline over the past month. Starting just before Thanksgiving, patientt developed thrush. This apparently drastically impacted his PO intake, with him eating "nothing" for 7-10 days. He was prescribed nystatin, but as his thrush was resolving, he developed diarrhea, also impacting appetite and functional status. More chronically, pt reports generalized anorexia and ageusia. Family member says PCP told them this was a result of past cancer tx. Regarding his treatment, he had finished course of immunotherapy in  July. Both patient and family report excellent tolerance of the immunotherapy w/o any noticeable symptoms or side effects.   At baseline, they do not believe pt takes any vitamins. He does not drink supplements. He does not regularly use laxatives, stool softeners or antidiarrheals. He does not follow any therapeutic diet.    Pt was admitted at weight of 120 lbs, measured via bed scale. Per chart, the pts weight has trended down over the past month. He was 129 lbs 1 month ago and 125 lbs 10 days ago. His loss of 9 lbs over the past month equates to 7% bw, which meets malnutrition criteria. Prior to this acute illness, his wt had been stable at 128-132 lbs x 15 months.   At this time, he reports his appetite is improving, but is still suboptimal. He is still significantly SOB and desats when he says more than a few sentences. He ate 75% of his lunch today, but only 40-50% of his dinner. He likes the Ensures. He complains of the quality of the food. RD urged them to bring in appropriate outside food.   RD gave some basic education on COPD related cachexia. He sounds to have a poor sense of taste and appetite. RD educated that pt will likely never fully regain his appetite or taste, but if he wants to stay healthy, he needs to eat anyway. RD reviewed basic recommendations for weight gain, ie small frequent meals, drinking calories, high fat/pro intake etc. RD provided handouts titled "Underweight Nutrition Therapy" and "High-calorie, High Protein Nutrition therapy".   Noted pts ECHO showed reduced EF. Cardiology noted pt will likely need LHC/RHC. Regarding the provided handouts, RD emphasized they are not specialized towards cardiac patients and some listed  items may be  high in Sat fat/sodium. Noted last lipid panel results only showed mildly elevated TG. Asked family to use discretion and choose lower salt items, but do believe he should prioritize  fat right now given his minimal reserves, weight loss and  poor intake. Will continue Ensures and add mvi with minerals.   Labs: Na: 131-133, Glu:125-145, BNP >11K,  Albumin: 3.8 Meds: Ensure Enlive BID, Methylprednisolone, Oral abx, Aricept.   Recent Labs  Lab 06/07/18 1737 06/08/18 0416  NA 133* 131*  K 4.4 4.5  CL 103 106  CO2 19* 16*  BUN 18 16  CREATININE 1.09 0.85  CALCIUM 9.0 8.6*  GLUCOSE 145* 126*   NUTRITION - FOCUSED PHYSICAL EXAM:   Most Recent Value  Orbital Region  Mild depletion  Upper Arm Region  Moderate depletion  Thoracic and Lumbar Region  Severe depletion  Buccal Region  Moderate depletion  Temple Region  Moderate depletion  Clavicle Bone Region  Severe depletion  Clavicle and Acromion Bone Region  Moderate depletion  Scapular Bone Region  Unable to assess  Dorsal Hand  Moderate depletion  Patellar Region  Unable to assess  Anterior Thigh Region  Unable to assess  Posterior Calf Region  Unable to assess  Edema (RD Assessment)  None  Hair  Reviewed  Eyes  Reviewed  Mouth  Reviewed  Skin  Reviewed  Nails  Reviewed     Diet Order:   Diet Order            Diet Heart Room service appropriate? Yes; Fluid consistency: Thin  Diet effective now             EDUCATION NEEDS:  Education needs have been addressed  Skin:  Skin Assessment: Reviewed RN Assessment  Last BM:  Unknown  Height:  Ht Readings from Last 1 Encounters:  06/07/18 5\' 7"  (1.702 m)   Weight:  Wt Readings from Last 1 Encounters:  06/07/18 54.4 kg   Wt Readings from Last 10 Encounters:  06/07/18 54.4 kg  06/07/18 55.8 kg  05/29/18 56.5 kg  05/10/18 58.4 kg  04/03/18 59 kg  03/30/18 58.5 kg  03/20/18 58.1 kg  03/16/18 58.1 kg  02/09/18 58.5 kg  02/08/18 60.1 kg   Ideal Body Weight:  67.27 kg  BMI:  Body mass index is 18.78 kg/m.  Estimated Nutritional Needs:  Kcal:  1900-2200 kcals (35-40 kcal/kg bw) Protein:  82-98g Pro (1.5-1.8g/kg bw) Fluid:  1.9-2.2 L (1 ml/kcal)  Burtis Junes RD, LDN, CNSC Clinical  Nutrition Available Tues-Sat via Pager: [phone] 06/08/2018 6:45 PM

## 2018-06-08 NOTE — H&P (Signed)
History and Physical    [name] [name]:[id number] DOB: 1940-12-30 DOA: 06/07/2018  PCP: [name], MD  Patient coming from: Home  Chief Complaint: Shortness of breath  HPI: [name] [name] is a 77 y.o. male with medical history significant of lung cancer, COPD, coronary artery disease, A. fib, peripheral vascular disease which he sees primary care physician today for 1 week of progressive worsening shortness of breath and dyspnea on exertion.  He denies any chest pain.  He says he has been coughing a lot but not having any fevers.  He denies any lower extremity edema or swelling.  He is currently off of his treatment for his cancer and is supposed to follow-up with his oncologist in January to resume any treatment if needed.  He just completed a year of immune O therapy.  He does not currently smoke on arrival here he was wheezing.  His O2 sats were in the 60s with his primary care physician's office were also low here on arrival at 66% on room air.  He does not require supplemental oxygen at home.  Patient is been given nebulizer treatment Solu-Medrol and oxygen supplementation and says he is feeling markedly better.  He also has a troponin of 0.72.  He has not been eating well over the last week secondary to his shortness of breath.  Patient is being referred for admission for acute hypoxic respiratory failure secondary to COPD exacerbation.  Review of Systems: As per HPI otherwise 10 point review of systems negative.   Past Medical History:  Diagnosis Date  . Adenocarcinoma of right lung, stage 3 ([place]) 09/20/2016  . Atrial fibrillation ([place])    no hx of reported at preop visit of 04/21/11   . CAD (coronary artery disease)   . Carotid artery occlusion    left carotid endarterectomy  . Chronic kidney disease    AAA repair with reimplant of renals   . CHRONIC OBSTRUCTIVE PULMONARY DISEASE    pt denied at visit of 04/21/11   . CORONARY ARTERY DISEASE   . Dehydration 11/15/2016  .  Disorder of left sacroiliac joint 08/24/2016  . DIVERTICULOSIS OF COLON   . Encounter for antineoplastic chemotherapy 10/17/2016  . GERD   . H/O hiatal hernia   . Headache(784.0)    Hx: of years of years  . HYPERTENSION   . HYPOTHYROIDISM   . JOINT EFFUSION, KNEE   . KNEE, ARTHRITIS, DEGEN./OSTEO    right knee   . Myocardial infarction ([place])    1994   . NEPHROLITHIASIS, HX OF   . OSTEOPOROSIS   . Other dysphagia   . PERIPHERAL VASCULAR DISEASE    AAA - 1994 with reimplant of renals   . Peripheral vascular disease ([place])    subclavian stenosis PTA - 3/08   . Pure hypercholesterolemia   . Renal artery stenosis [facility])     Past Surgical History:  Procedure Laterality Date  . ABDOMINAL AORTIC ANEURYSM REPAIR     with reimplantation of renals   . CARDIAC CATHETERIZATION     2008  . CAROTID ENDARTERECTOMY Left Jan. 30, 2015   CEA  . CHOLECYSTECTOMY  2000   Gall Bladder  . COLONOSCOPY W/ BIOPSIES AND POLYPECTOMY     Hx: of  . CORONARY ARTERY BYPASS GRAFT     1995  . CORONARY STENT PLACEMENT     Hx: of  . ENDARTERECTOMY Left 07/19/2013   Procedure: ENDARTERECTOMY CAROTID;  Surgeon: Mal Misty, MD;  Location:  [facility] OR;  Service: Vascular;  Laterality: Left;  . GALLBLADDER SURGERY  2004   . JOINT REPLACEMENT     partial knee replacement on left 2002   . KNEE SURGERY    . ORIF PELVIC FRACTURE Left 08/25/2016   Procedure: OPEN REDUCTION INTERNAL FIXATION (ORIF) PELVIC FRACTURE; plate on front, SI screw on the back;  Surgeon: Altamese Bent, MD;  Location: [facility];  Service: Orthopedics;  Laterality: Left;  . OTHER SURGICAL HISTORY     left subclavian stenosis surgery PTA 08/2006   . OTHER SURGICAL HISTORY     carotid surgery on right 2004   . TOTAL KNEE ARTHROPLASTY  04/29/2011   Procedure: TOTAL KNEE ARTHROPLASTY;  Surgeon: Gearlean Alf;  Location: [facility];  Service: Orthopedics;  Laterality: Right;  . urinary retention    . VASCULAR SURGERY     AAA  . VASECTOMY  1973  . VIDEO  BRONCHOSCOPY WITH ENDOBRONCHIAL ULTRASOUND [name] 09/09/2016   Procedure: VIDEO BRONCHOSCOPY WITH ENDOBRONCHIAL ULTRASOUND;  Surgeon: Grace Isaac, MD;  Location: [facility];  Service: Thoracic;  Laterality: [name];     reports that he quit smoking about 25 years ago. His smoking use included cigarettes. He has never used smokeless tobacco. He reports that he does not drink alcohol or use drugs.  Allergies  Allergen Reactions  . Neurontin [Gabapentin] Other (See Comments)    Caused confusion  . Imitrex [Sumatriptan] Nausea And Vomiting and Other (See Comments)    "made me like I was having a stoke"  . Lipitor [Atorvastatin] Nausea And Vomiting and Other (See Comments)    INTOLERANCE > MYALGIAS "couldn't get out of the bed ( pt had to crawl out of bed ) I hurt so bad"    Family History  Problem Relation Age of Onset  . Hypertension Mother   . Stroke Mother   . Heart disease Father   . Heart attack Father   . Diabetes Sister   . Cancer Brother 79       Lung  . Diabetes Sister     Prior to Admission medications   Medication Sig Start Date End Date Taking? Authorizing Provider  acetaminophen (TYLENOL) 325 MG tablet Take 325-650 mg by mouth daily as needed for headache (for pain).    Yes [provider]  bumetanide (BUMEX) 1 MG tablet Take 1 tablet (1 mg total) by mouth daily as needed (swelling). 10/07/16  Yes Kathie Dike, MD  donepezil (ARICEPT) 10 MG tablet Take 1 tablet daily Patient taking differently: Take 10 mg by mouth at bedtime.  04/03/18  Yes Cameron Sprang, MD  lansoprazole (PREVACID) 15 MG capsule Take 1 capsule (15 mg total) by mouth daily. Patient taking differently: Take 15 mg by mouth every morning.  09/16/16  Yes Angiulli, Lavon Paganini, PA-C  levothyroxine (SYNTHROID, LEVOTHROID) 112 MCG tablet Take 1 tablet (112 mcg total) by mouth daily. Patient taking differently: Take 112 mcg by mouth every morning.  08/17/17  Yes [name], MD  linaclotide [facility])  145 MCG CAPS capsule Take 1 capsule (145 mcg total) by mouth daily before breakfast. Patient taking differently: Take 145 mcg by mouth daily as needed (for constipation).  08/17/17  Yes [name], MD  magic mouthwash w/lidocaine SOLN Composition: 80 cc each of Extra St. Maalox Plus, Diphenhydramine, and Nystatin. Plus 240 cc of 2% viscous lidocaine to make 480 cc. Swish and swallow 2 tsp four times per day. 12/19/16  Yes Tyler Pita, MD  metoprolol tartrate (LOPRESSOR) 25 MG tablet TAKE ONE-HALF TABLET BY MOUTH TWO TIMES DAILY Patient taking differently: Take 12.5 mg by mouth 2 (two) times daily.  06/06/18  Yes [name], MD  niacin (SLO-NIACIN) 500 MG tablet Take 3 tablets (1,500 mg total) by mouth at bedtime. 09/16/16  Yes Angiulli, Lavon Paganini, PA-C  nitroGLYCERIN (NITROSTAT) 0.4 MG SL tablet Place 1 tablet (0.4 mg total) under the tongue every 5 (five) minutes as needed for chest pain. 09/16/16  Yes Angiulli, Lavon Paganini, PA-C  potassium chloride SA (K-DUR,KLOR-CON) 20 MEQ tablet Take 1 tablet (20 mEq total) by mouth daily. Patient taking differently: Take 20 mEq by mouth at bedtime.  08/17/17  Yes [name], MD  rosuvastatin (CRESTOR) 20 MG tablet Take 1 tablet (20 mg total) by mouth daily. Patient taking differently: Take 20 mg by mouth at bedtime.  01/05/18  Yes [name], MD  tamsulosin (FLOMAX) 0.4 MG CAPS capsule TAKE ONE CAPSULE BY MOUTH TWICE A DAY. Patient taking differently: Take 0.4 mg by mouth every morning.  09/07/17  Yes [name], MD  warfarin (COUMADIN) 5 MG tablet TAKE ONE TABLET ON MONDAY WEDNESDAY AND SATURDAY, TAKE ONE AND ONE-HALF ON SUNDAY TUESDAY [place] Patient taking differently: Take 5 mg by mouth See admin instructions.  04/19/18  Yes [name], MD  ALPRAZolam Duanne Moron) 0.25 MG tablet Take 1 tablet (0.25 mg total) by mouth 3 (three) times daily as needed for anxiety. Patient not taking: Reported on 06/07/2018 09/16/16   Angiulli,  Lavon Paganini, PA-C  methylPREDNISolone (MEDROL DOSEPAK) 4 MG TBPK tablet Use as instructed. Patient not taking: Reported on 06/07/2018 05/10/18   Curt Bears, MD  nystatin (MYCOSTATIN) 100000 UNIT/ML suspension TAKE 5 ML (500,000 UNITS TOTAL) BY MOUTHFOUR TIMES DAILY. SWISH AND SWALLOW. Patient not taking: Reported on 06/07/2018 05/22/18   Maryanna Shape, NP  potassium chloride SA (K-DUR,KLOR-CON) 20 MEQ tablet TAKE ONE TABLET (20MEQ TOTAL) BY MOUTH DAILY Patient not taking: Reported on 06/07/2018 05/31/18   [name], MD    Physical Exam: Vitals:   06/07/18 2230 06/07/18 2300 06/07/18 2330 06/07/18 2351  BP: 139/85 133/79 (!) 149/79   Pulse: 87 88 82   Resp: 16 (!) 22 (!) 28   Temp:    (!) 97.4 F (36.3 C)  TempSrc:    Axillary  SpO2: 97% 90% 98%   Weight:    54.4 kg  Height:    5\' 7"  (1.702 m)      Constitutional: NAD, calm, comfortable Vitals:   06/07/18 2230 06/07/18 2300 06/07/18 2330 06/07/18 2351  BP: 139/85 133/79 (!) 149/79   Pulse: 87 88 82   Resp: 16 (!) 22 (!) 28   Temp:    (!) 97.4 F (36.3 C)  TempSrc:    Axillary  SpO2: 97% 90% 98%   Weight:    54.4 kg  Height:    5\' 7"  (1.702 m)   Eyes: PERRL, lids and conjunctivae normal ENMT: Mucous membranes are moist. Posterior pharynx clear of any exudate or lesions.Normal dentition.  Neck: normal, supple, no masses, no thyromegaly Respiratory: clear to auscultation bilaterally, no wheezing, no crackles. Normal respiratory effort. No accessory muscle use.  Cardiovascular: Regular rate and rhythm, no murmurs / rubs / gallops. No extremity edema. 2+ pedal pulses. No carotid bruits.  Abdomen: no tenderness, no masses palpated. No hepatosplenomegaly. Bowel sounds positive.  Musculoskeletal: no clubbing / cyanosis. No joint deformity upper and lower extremities. Good ROM, no  contractures. Normal muscle tone.  Skin: no rashes, lesions, ulcers. No induration Neurologic: CN 2-12 grossly intact. Sensation intact,  DTR normal. Strength 5/5 in all 4.  Psychiatric: Normal judgment and insight. Alert and oriented x 3. Normal mood.    Labs on Admission: I have personally reviewed following labs and imaging studies  CBC: Recent Labs  Lab 06/07/18 1737  WBC 7.1  NEUTROABS 6.2  HGB 15.7  HCT 47.2  MCV 89.6  PLT 371   Basic Metabolic Panel: Recent Labs  Lab 06/07/18 1737  NA 133*  K 4.4  CL 103  CO2 19*  GLUCOSE 145*  BUN 18  CREATININE 1.09  CALCIUM 9.0   GFR: Estimated Creatinine Clearance: 42.3 mL/min (by C-G formula based on SCr of 1.09 mg/dL). Liver Function Tests: Recent Labs  Lab 06/07/18 1737  AST 29  ALT 19  ALKPHOS 110  BILITOT 0.8  PROT 7.2  ALBUMIN 3.8   No results for input(s): LIPASE, AMYLASE in the last 168 hours. No results for input(s): AMMONIA in the last 168 hours. Coagulation Profile: Recent Labs  Lab 06/01/18 1056 06/07/18 1556 06/07/18 1737  INR 1.7* 6.4* 4.18*   Cardiac Enzymes: Recent Labs  Lab 06/07/18 1737 06/07/18 2258  TROPONINI 0.72* 0.77*   BNP (last 3 results) No results for input(s): PROBNP in the last 8760 hours. HbA1C: No results for input(s): HGBA1C in the last 72 hours. CBG: No results for input(s): GLUCAP in the last 168 hours. Lipid Profile: No results for input(s): CHOL, HDL, LDLCALC, TRIG, CHOLHDL, LDLDIRECT in the last 72 hours. Thyroid Function Tests: No results for input(s): TSH, T4TOTAL, FREET4, T3FREE, THYROIDAB in the last 72 hours. Anemia Panel: No results for input(s): VITAMINB12, FOLATE, FERRITIN, TIBC, IRON, RETICCTPCT in the last 72 hours. Urine analysis:    Component Value Date/Time   COLORURINE AMBER (A) 02/08/2017 0955   APPEARANCEUR HAZY (A) 02/08/2017 0955   LABSPEC 1.023 02/08/2017 0955   PHURINE 5.0 02/08/2017 0955   GLUCOSEU NEGATIVE 02/08/2017 0955   HGBUR LARGE (A) 02/08/2017 0955   BILIRUBINUR NEGATIVE 02/08/2017 0955   KETONESUR NEGATIVE 02/08/2017 0955   PROTEINUR 100 (A) 02/08/2017 0955    UROBILINOGEN 1.0 07/17/2013 1201   NITRITE POSITIVE (A) 02/08/2017 0955   LEUKOCYTESUR NEGATIVE 02/08/2017 0955   Sepsis Labs: !!!!!!!!!!!!!!!!!!!!!!!!!!!!!!!!!!!!!!!!!!!! @LABRCNTIP (procalcitonin:4,lacticidven:4) )No results found for this or any previous visit (from the past 240 hour(s)).   Radiological Exams on Admission: Dg Chest 2 View  Result Date: 06/07/2018 CLINICAL DATA:  Hypoxia.  Shortness of breath. EXAM: CHEST - 2 VIEW COMPARISON:  CT scan May 08, 2017 FINDINGS: Opacity in the right perihilar region is consistent with post therapeutic/radiation changes and is similar since November 2019. No pneumothorax. The cardiomediastinal silhouette is stable. No new nodules or masses. Mild scarring in the left base. No new suspicious infiltrate. IMPRESSION: Post therapeutic changes on the right.  No acute interval changes. Electronically Signed   By: Dorise Bullion III M.D   On: 06/07/2018 17:28   Ct Angio Chest Pe W/cm &/or Wo Cm  Result Date: 06/07/2018 CLINICAL DATA:  Hypoxia. Shortness of breath for the past week. Right lung cancer. EXAM: CT ANGIOGRAPHY CHEST WITH CONTRAST TECHNIQUE: Multidetector CT imaging of the chest was performed using the standard protocol during bolus administration of intravenous contrast. Multiplanar CT image reconstructions and MIPs were obtained to evaluate the vascular anatomy. CONTRAST:  126mL ISOVUE-370 IOPAMIDOL (ISOVUE-370) INJECTION 76% COMPARISON:  CT chest dated May 08, 2018. FINDINGS: Cardiovascular: Satisfactory opacification of  the pulmonary arteries to the segmental level. No evidence of pulmonary embolism. Stable cardiomegaly. No pericardial effusion. Prior CABG. No thoracic aortic aneurysm. Evaluation for aortic dissection is limited due to phase of contrast. Coronary, aortic arch, and branch vessel atherosclerotic vascular disease. 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 Mediastinum/Nodes: Stable right low jugular and paratracheal lymphadenopathy measuring up to 16 mm. Unchanged 9 mm subcarinal lymph node. Unchanged 10 mm left hilar lymph nodes. No axillary lymphadenopathy. The thyroid gland, trachea, and esophagus demonstrate no significant findings. Lungs/Pleura: Moderate centrilobular emphysema again noted. Unchanged 5 mm pulmonary nodule in the right upper lobe. Previously seen 5 mm nodule in the inferior right upper lobe is no longer identified. Unchanged geographic consolidation and traction bronchiectasis centered about the medial and perihilar right lung. Unchanged chronic right pleural thickening. No pneumothorax, pleural effusion or new airspace consolidation. Upper Abdomen: No acute abnormality. Musculoskeletal: No chest wall abnormality. No acute or significant osseous findings. Chronic T6 and T8 mild compression deformities are unchanged. Review of the MIP images confirms the above findings. IMPRESSION: 1. No evidence of pulmonary embolism. No acute intrathoracic process. 2. Unchanged right perihilar radiation fibrosis. 3. Stable lower cervical and right paratracheal lymphadenopathy. 4. Previously seen new 5 mm pulmonary nodule in the right upper lobe is no longer identified and was likely inflammatory or infectious. 5. 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 6.  Emphysema (ICD10-J43.9). 7.  Aortic atherosclerosis (ICD10-I70.0). Electronically Signed   By: Titus Dubin M.D.   On: 06/07/2018 21:32    EKG: Independently reviewed.  Very poor quality sinus tachycardia Old chart reviewed Case discussed with EDP Dr. Vanita Panda Chest x-ray reviewed no edema or infiltrate  Assessment/Plan 77 year old male with acute hypoxic respiratory failure secondary to COPD exacerbation Principal Problem:   Acute respiratory failure with hypoxia (HCC)-likely secondary to COPD exacerbation treat  as below.  Feeling much better on supplemental oxygen.  Continue oxygen support and see details following.  Active Problems:   COPD with acute exacerbation (HCC)-placed on IV Solu-Medrol frequent nebulizer treatments and doxycycline due to severity of hypoxia.  Wean oxygen as tolerates.  Suspect he will be able to wean down to room room air in the next 48 hours.    SOB (shortness of breath)-again probably all secondary to COPD he had a CTA of his chest done today which was negative for PE and did not really show any advancement of his lung cancer.  Does not show pneumonia either.  Treat for COPD.    Essential hypertension-stable continue home meds    PAF (paroxysmal atrial fibrillation) (HCC)-resume home meds    T1b N2 M0 disease (stage IIIA).adenocarcinoma of the right middle lobe of lung (HCC)-appears stable and not advanced from last imaging. Continue outpatient follow-up with his oncologist    Dementia, neurological (HCC)-seems mild.  Possible NSTEMI-troponin is 0.7 unsure if this is demand ischemia.  Will serial troponin overnight and see how it trends.  We will also obtain a cardiac echo in the morning.  Repeat EKG.  Chronic anticoagulation with Coumadin with supratherapeutic INR-INR 4.5 at this time.  Hold Coumadin at this time.  Family is asking and inquiring being put on other agents that do not require monitoring.  Will defer to PCP.   12-lead EKG poor quality will repeat   DVT prophylaxis: Coumadin Code Status: Full Family Communication: Wife  son and daughter Disposition Plan: 1 to 3 days Consults called: None Admission status: Admission   DAVID,RACHAL A MD [facility]  If 7PM-7AM, please contact night-coverage [email or web address] Password [email or web address]  06/08/2018, 12:19 AM

## 2018-06-08 NOTE — Consult Note (Addendum)
Cardiology Consult    Patient ID: [name] [name]; [id number]; [birth year]   Admit date: 06/07/2018 Date of Consult: 06/08/2018  Primary Care Provider: [name], MD Primary Cardiologist: Jenkins Rouge, MD     Patient Profile    [name] [name] is a 77 y.o. male with past medical history of CAD (s/p CABG in 1995, cath in 2008 showing patent LIMA-LAD and SVG-PDA), AAA (s/p repair), PAD (s/p subclavian stenting and bilateral CEA with R CEA in 2005 and L CEA in 2015), HTN, HLD, dementia and NSCLC (s/p radiation, chemotherapy, and immunotherapy with most recent scan showing enlarging lymph nodes currently being followed) who is being seen today for the evaluation of elevated troponin values at the request of Dr. Manuella Ghazi.   History of Present Illness    [name] [name] was last examined by Dr. Johnsie Cancel in 08/2016 during that admission following a mechanical fall and subsequent pelvic fracture.  During admission, he was also found to have bilateral DVT's and Plavix was discontinued with him being transitioned to ASA and Coumadin.  He presented to [facility] ED on 06/07/2018 after having been found to be hypoxic at his PCP's visit with saturations at 66% on RA. Initial labs showed WBC 7.1, Hgb 15.7, platelets 154, Na+ 133, K+ 4.4, and creatinine 1.09. BNP elevated to 1166. INR elevated to 6.4 when checked by PCP and at 4.18 by recheck at the hospital. Initial and cyclic troponin values have been elevated to 0.72, 0.77, and 0.60.  CXR showed no acute findings. CTA showed no evidence of a PE and was noted to have stable lower cervical and right paratracheal lymphadenopathy and unchanged stenosis of the celiac and SMA origins with known emphysema and aortic atherosclerosis. Initial EKG showed a computer-generated rate of atrial flutter but significant artifact is noted. Repeat tracing this morning shows normal sinus rhythm, heart rate 97, with RBBB and LAFB.   He was admitted for acute hypoxic  respiratory failure thought to be secondary to a COPD exacerbation and has been started on IV steroids along with Doxycycline.  In talking with the patient and his wife today, he reports that he had been experiencing shortness of breath with exertion and at rest over the past 2 to 3 days leading up to admission.  His wife reports that he is usually active and doing yard work outside and had not had any recent symptoms but over the past few days he was unable to walk even 20 feet to go to the restroom without having significant dyspnea.  He denies any associated chest pain and the patient's wife reports he has not mentioned any to her recently.  No recent palpitations, orthopnea, PND, or lower extremity edema.  At the time of this encounter, he reports significant improvement in his symptoms as compared to admission.  Past Medical History:  Diagnosis Date  . Adenocarcinoma of right lung, stage 3 ([place]) 09/20/2016  . Atrial fibrillation ([place])    no hx of reported at preop visit of 04/21/11   . CAD (coronary artery disease)   . Carotid artery occlusion    left carotid endarterectomy  . Chronic kidney disease    AAA repair with reimplant of renals   . CHRONIC OBSTRUCTIVE PULMONARY DISEASE    pt denied at visit of 04/21/11   . CORONARY ARTERY DISEASE   . Dehydration 11/15/2016  . Disorder of left sacroiliac joint 08/24/2016  . DIVERTICULOSIS OF COLON   . Encounter for antineoplastic chemotherapy 10/17/2016  .  GERD   . H/O hiatal hernia   . Headache(784.0)    Hx: of years of years  . HYPERTENSION   . HYPOTHYROIDISM   . JOINT EFFUSION, KNEE   . KNEE, ARTHRITIS, DEGEN./OSTEO    right knee   . Myocardial infarction ([place])    1994   . NEPHROLITHIASIS, HX OF   . OSTEOPOROSIS   . Other dysphagia   . PERIPHERAL VASCULAR DISEASE    AAA - 1994 with reimplant of renals   . Peripheral vascular disease ([place])    subclavian stenosis PTA - 3/08   . Pure hypercholesterolemia   . Renal artery stenosis [facility])       Past Surgical History:  Procedure Laterality Date  . ABDOMINAL AORTIC ANEURYSM REPAIR     with reimplantation of renals   . CARDIAC CATHETERIZATION     2008  . CAROTID ENDARTERECTOMY Left Jan. 30, 2015   CEA  . CHOLECYSTECTOMY  2000   Gall Bladder  . COLONOSCOPY W/ BIOPSIES AND POLYPECTOMY     Hx: of  . CORONARY ARTERY BYPASS GRAFT     1995  . CORONARY STENT PLACEMENT     Hx: of  . ENDARTERECTOMY Left 07/19/2013   Procedure: ENDARTERECTOMY CAROTID;  Surgeon: Mal Misty, MD;  Location: [place];  Service: Vascular;  Laterality: Left;  . GALLBLADDER SURGERY  2004   . JOINT REPLACEMENT     partial knee replacement on left 2002   . KNEE SURGERY    . ORIF PELVIC FRACTURE Left 08/25/2016   Procedure: OPEN REDUCTION INTERNAL FIXATION (ORIF) PELVIC FRACTURE; plate on front, SI screw on the back;  Surgeon: Altamese Jackpot, MD;  Location: [place];  Service: Orthopedics;  Laterality: Left;  . OTHER SURGICAL HISTORY     left subclavian stenosis surgery PTA 08/2006   . OTHER SURGICAL HISTORY     carotid surgery on right 2004   . TOTAL KNEE ARTHROPLASTY  04/29/2011   Procedure: TOTAL KNEE ARTHROPLASTY;  Surgeon: Gearlean Alf;  Location: [facility];  Service: Orthopedics;  Laterality: Right;  . urinary retention    . VASCULAR SURGERY     AAA  . VASECTOMY  1973  . VIDEO BRONCHOSCOPY WITH ENDOBRONCHIAL ULTRASOUND [name] 09/09/2016   Procedure: VIDEO BRONCHOSCOPY WITH ENDOBRONCHIAL ULTRASOUND;  Surgeon: Grace Isaac, MD;  Location: [place];  Service: Thoracic;  Laterality: [name];     Home Medications:  Prior to Admission medications   Medication Sig Start Date End Date Taking? Authorizing Provider  acetaminophen (TYLENOL) 325 MG tablet Take 325-650 mg by mouth daily as needed for headache (for pain).    Yes [provider]  bumetanide (BUMEX) 1 MG tablet Take 1 tablet (1 mg total) by mouth daily as needed (swelling). 10/07/16  Yes Kathie Dike, MD  donepezil (ARICEPT) 10 MG tablet Take  1 tablet daily Patient taking differently: Take 10 mg by mouth at bedtime.  04/03/18  Yes Cameron Sprang, MD  lansoprazole (PREVACID) 15 MG capsule Take 1 capsule (15 mg total) by mouth daily. Patient taking differently: Take 15 mg by mouth every morning.  09/16/16  Yes Angiulli, Lavon Paganini, PA-C  levothyroxine (SYNTHROID, LEVOTHROID) 112 MCG tablet Take 1 tablet (112 mcg total) by mouth daily. Patient taking differently: Take 112 mcg by mouth every morning.  08/17/17  Yes [name], MD  linaclotide [facility]) 145 MCG CAPS capsule Take 1 capsule (145 mcg total) by mouth daily before breakfast. Patient taking differently: Take 145 mcg  by mouth daily as needed (for constipation).  08/17/17  Yes [name], MD  magic mouthwash w/lidocaine SOLN Composition: 80 cc each of Extra St. Maalox Plus, Diphenhydramine, and Nystatin. Plus 240 cc of 2% viscous lidocaine to make 480 cc. Swish and swallow 2 tsp four times per day. 12/19/16  Yes Tyler Pita, MD  metoprolol tartrate (LOPRESSOR) 25 MG tablet TAKE ONE-HALF TABLET BY MOUTH TWO TIMES DAILY Patient taking differently: Take 12.5 mg by mouth 2 (two) times daily.  06/06/18  Yes [name], MD  niacin (SLO-NIACIN) 500 MG tablet Take 3 tablets (1,500 mg total) by mouth at bedtime. 09/16/16  Yes Angiulli, Lavon Paganini, PA-C  nitroGLYCERIN (NITROSTAT) 0.4 MG SL tablet Place 1 tablet (0.4 mg total) under the tongue every 5 (five) minutes as needed for chest pain. 09/16/16  Yes Angiulli, Lavon Paganini, PA-C  potassium chloride SA (K-DUR,KLOR-CON) 20 MEQ tablet Take 1 tablet (20 mEq total) by mouth daily. Patient taking differently: Take 20 mEq by mouth at bedtime.  08/17/17  Yes [name], MD  rosuvastatin (CRESTOR) 20 MG tablet Take 1 tablet (20 mg total) by mouth daily. Patient taking differently: Take 20 mg by mouth at bedtime.  01/05/18  Yes [name], MD  tamsulosin (FLOMAX) 0.4 MG CAPS capsule TAKE ONE CAPSULE BY MOUTH TWICE A  DAY. Patient taking differently: Take 0.4 mg by mouth every morning.  09/07/17  Yes [name], MD  warfarin (COUMADIN) 5 MG tablet TAKE ONE TABLET ON MONDAY WEDNESDAY AND SATURDAY, TAKE ONE AND ONE-HALF ON SUNDAY TUESDAY [place] Patient taking differently: Take 5 mg by mouth See admin instructions.  04/19/18  Yes [name], MD  ALPRAZolam Duanne Moron) 0.25 MG tablet Take 1 tablet (0.25 mg total) by mouth 3 (three) times daily as needed for anxiety. Patient not taking: Reported on 06/07/2018 09/16/16   Angiulli, Lavon Paganini, PA-C  methylPREDNISolone (MEDROL DOSEPAK) 4 MG TBPK tablet Use as instructed. Patient not taking: Reported on 06/07/2018 05/10/18   Curt Bears, MD  nystatin (MYCOSTATIN) 100000 UNIT/ML suspension TAKE 5 ML (500,000 UNITS TOTAL) BY MOUTHFOUR TIMES DAILY. SWISH AND SWALLOW. Patient not taking: Reported on 06/07/2018 05/22/18   Maryanna Shape, NP  potassium chloride SA (K-DUR,KLOR-CON) 20 MEQ tablet TAKE ONE TABLET (20MEQ TOTAL) BY MOUTH DAILY Patient not taking: Reported on 06/07/2018 05/31/18   [name], MD    Inpatient Medications: Scheduled Meds: . donepezil  10 mg Oral QHS  . doxycycline  100 mg Oral Q12H  . feeding supplement (ENSURE ENLIVE)  237 mL Oral BID BM  . ipratropium-albuterol  3 mL Nebulization Q6H  . methylPREDNISolone (SOLU-MEDROL) injection  80 mg Intravenous Q12H  . metoprolol tartrate  12.5 mg Oral BID  . niacin  1,500 mg Oral QHS  . sodium chloride flush  3 mL Intravenous Q12H  . tamsulosin  0.4 mg Oral q morning - 10a   Continuous Infusions: . sodium chloride     PRN Meds: sodium chloride, acetaminophen, albuterol, ALPRAZolam, bumetanide, nitroGLYCERIN, sodium chloride flush  Allergies:    Allergies  Allergen Reactions  . Neurontin [Gabapentin] Other (See Comments)    Caused confusion  . Imitrex [Sumatriptan] Nausea And Vomiting and Other (See Comments)    "made me like I was having a stoke"  . Lipitor [Atorvastatin]  Nausea And Vomiting and Other (See Comments)    INTOLERANCE > MYALGIAS "couldn't get out of the bed ( pt had to crawl out of bed ) I  hurt so bad"    Social History:   Social History   Socioeconomic History  . Marital status: Married    Spouse name: Not on file  . Number of children: Not on file  . Years of education: Not on file  . Highest education level: Not on file  Occupational History  . Not on file  Social Needs  . Financial resource strain: Not on file  . Food insecurity:    Worry: Not on file    Inability: Not on file  . Transportation needs:    Medical: Not on file    Non-medical: Not on file  Tobacco Use  . Smoking status: Former Smoker    Types: Cigarettes    Last attempt to quit: 06/20/1992    Years since quitting: 25.9  . Smokeless tobacco: Never Used  Substance and Sexual Activity  . Alcohol use: No    Alcohol/week: 0.0 standard drinks  . Drug use: No  . Sexual activity: Not on file  Lifestyle  . Physical activity:    Days per week: Not on file    Minutes per session: Not on file  . Stress: Not on file  Relationships  . Social connections:    Talks on phone: Not on file    Gets together: Not on file    Attends religious service: Not on file    Active member of club or organization: Not on file    Attends meetings of clubs or organizations: Not on file    Relationship status: Not on file  . Intimate partner violence:    Fear of current or ex partner: Not on file    Emotionally abused: Not on file    Physically abused: Not on file    Forced sexual activity: Not on file  Other Topics Concern  . Not on file  Social History Narrative  . Not on file     Family History:    Family History  Problem Relation Age of Onset  . Hypertension Mother   . Stroke Mother   . Heart disease Father   . Heart attack Father   . Diabetes Sister   . Cancer Brother 54       Lung  . Diabetes Sister       Review of Systems    General:  No chills, fever, night  sweats or weight changes.  Cardiovascular:  No chest pain, edema, orthopnea, palpitations, paroxysmal nocturnal dyspnea. Positive for dyspnea on exertion.  Dermatological: No rash, lesions/masses Respiratory: No cough, Positive for dyspnea.  Urologic: No hematuria, dysuria Abdominal:   No nausea, vomiting, diarrhea, bright red blood per rectum, melena, or hematemesis Neurologic:  No visual changes, wkns, changes in mental status. All other systems reviewed and are otherwise negative except as noted above.  Physical Exam/Data    Vitals:   06/08/18 0600 06/08/18 0700 06/08/18 0800 06/08/18 0853  BP: 122/76 120/79 121/76   Pulse: 84 91 81   Resp: 20 20 18    Temp:      TempSrc:      SpO2: 93% 95% 98% 95%  Weight:      Height:        Intake/Output Summary (Last 24 hours) at 06/08/2018 0857 Last data filed at 06/07/2018 2109 Gross per 24 hour  Intake 1000 ml  Output 100 ml  Net 900 ml   Filed Weights   06/07/18 1652 06/07/18 2351  Weight: 55.8 kg 54.4 kg   Body mass index is 18.78  kg/m.   General: Pleasant, elderly Caucasian male appearing in NAD.  Psych: Normal affect. Neuro: Alert and oriented X 3. Moves all extremities spontaneously. HEENT: Normal  Neck: Supple without bruits or JVD. Lungs:  Resp regular and unlabored, rhonchi throughout with mild expiratory wheeze. Heart: RRR no s3, s4, or murmurs. Abdomen: Soft, non-tender, non-distended, BS + x 4.  Extremities: No clubbing, cyanosis or lower extremity edema. DP/PT/Radials 2+ and equal bilaterally.   Labs/Studies     Relevant CV Studies:  Cardiac Catheterization: 08/2006  CARDIAC CATHETERIZATION   Ran Tullis is a 77 year old vasculopathy, status post previous CABG.  He started having chest, pain study was done to assess graft patency.  Bilious somewhat complicated.  He has had a tube graft repair of an  abdominal aortic aneurysm with renal reimplantation. He has also had  significant and multiple  interventions to his left subclavian by Dr.  Kellie Simmering. His previous cath in 2001 showed his LIMA to be somewhat atretic  and required brachial approach.   The patient has been having recurrent chest pain and cath was indicated  to rule out graft stenosis.   A 6-French sheath was placed in the right femoral artery without  difficulty.   The patient had somewhat unusual native left main coronary circulation.  We manipulated the JL-4 catheter.  We knew from previous angiograms that  the main artery and LAD were occluded.  We were able to do a flush  injections to document that the circumflex coronary artery was patent.  It primarily consisted of a large OM Axzel [place] with no significant  stenoses.   The native right coronary artery was occluded in the mid vessel.   We took great care in trying to assess the left subclavian. Wire  exchanges and a slick wire were used. The patient has known ulcerated  plaques in this area. Injection of the left subclavian showed the stents  to be patent with a residual ulcerated plaque coming off medially. The  proximal portion of the LIMA was patent but then was most likely atretic  in the mid vessel as seen previously.   We did not take any other injections of the subclavian or try to  selectively engage LIMA and it has previously been documented to be  atretic and also did not supplied the LAD.   Using the right coronary catheter and a no tow catheter we were able to  inject the right internal mammary artery.  It was widely patent to the  mid-LAD. The distal LAD native had 20-30% discrete lesions.   The saphenous vein graft to the right coronary artery was widely patent  with retrograde filling of the PLA.   RAO ventriculography showed normal LV function.  EF was 55%.  There was  no grading across the aortic valve and no MR.  The patient also received  10 of hydralazine for blood pressure.  We gave him a sublingual nitro at  the beginning of  the case to make sure that his arteries were as dilated  as possible. His LV pressure was 120/18, aortic pressure is 120/66.   Imaging of the distal aorta showed the tube graft to be widely patent  with no residual aneurysms. He did have some left iliac disease.  The  renal arteries were implanted and more widely patent left and right.  There was an ostial 30% stenosis in the left renal artery but this is  not clinically significant.   IMPRESSION:  Bili is avascular path.  He fortunately has no change in  his anatomy since 2001.  In particular the rim of the LAD is widely  patent, as the vein graft to the PDA and the native circ does not have  critical disease.   His LV function continues to be normal.  His tube graft and renal artery  implants look great.   His biggest issues will be ongoing support of the left subclavian,  stenosis and stent.  I would think that he is at some risk for embolic  events to his left carotid given the ulcerated plaques in his left  subclavian. We will continue on aspirin and Plavix therapy.  Dr. Kellie Simmering  is following this closely.  Recently at the end of last year put a  second stent in this area.   Overall, despite the difficulty of the case, the patient tolerated  procedure well and will be discharged later today from the day hospital  so long as his groin seals well.  SECOND IS 98 ML    Echocardiogram: 12/2016  Study Conclusions  - Left ventricle: The cavity size was normal. There was mild focal   basal hypertrophy of the septum. Systolic function was normal.   The estimated ejection fraction was in the range of 50% to 55%.   Wall motion was normal; there were no regional wall motion   abnormalities. Doppler parameters are consistent with abnormal   left ventricular relaxation (grade 1 diastolic dysfunction). - Aortic valve: Trileaflet; mildly thickened, mildly calcified   leaflets. - Mitral valve: Calcified annulus. There was mild  regurgitation. - Left atrium: The atrium was at the upper limits of normal in   size. - Atrial septum: The interatrial septum was hypermobile. Agitated   saline contrast study showed a right-to-left shunt through a   patent foramen ovale. - Tricuspid valve: There was moderate regurgitation.   Laboratory Data:  Chemistry Recent Labs  Lab 06/07/18 1737 06/08/18 0416  NA 133* 131*  K 4.4 4.5  CL 103 106  CO2 19* 16*  GLUCOSE 145* 126*  BUN 18 16  CREATININE 1.09 0.85  CALCIUM 9.0 8.6*  GFRNONAA >60 >60  GFRAA >60 >60  ANIONGAP 11 9    Recent Labs  Lab 06/07/18 1737  PROT 7.2  ALBUMIN 3.8  AST 29  ALT 19  ALKPHOS 110  BILITOT 0.8   Hematology Recent Labs  Lab 06/07/18 1737 06/08/18 0416  WBC 7.1 4.7  RBC 5.27 5.02  HGB 15.7 15.3  HCT 47.2 44.8  MCV 89.6 89.2  MCH 29.8 30.5  MCHC 33.3 34.2  RDW 16.0* 15.6*  PLT 154 116*   Cardiac Enzymes Recent Labs  Lab 06/07/18 1737 06/07/18 2258 06/08/18 0416  TROPONINI 0.72* 0.77* 0.60*   No results for input(s): TROPIPOC in the last 168 hours.  BNP Recent Labs  Lab 06/07/18 1737  BNP 1,166.0*    DDimer No results for input(s): DDIMER in the last 168 hours.  Radiology/Studies:  Dg Chest 2 View  Result Date: 06/07/2018 CLINICAL DATA:  Hypoxia.  Shortness of breath. EXAM: CHEST - 2 VIEW COMPARISON:  CT scan May 08, 2017 FINDINGS: Opacity in the right perihilar region is consistent with post therapeutic/radiation changes and is similar since November 2019. No pneumothorax. The cardiomediastinal silhouette is stable. No new nodules or masses. Mild scarring in the left base. No new suspicious infiltrate. IMPRESSION: Post therapeutic changes on the right.  No acute interval changes. Electronically Signed   By: Dorise Bullion III  M.D   On: 06/07/2018 17:28   Ct Angio Chest Pe W/cm &/or Wo Cm  Result Date: 06/07/2018 CLINICAL DATA:  Hypoxia. Shortness of breath for the past week. Right lung cancer. EXAM: CT  ANGIOGRAPHY CHEST WITH CONTRAST TECHNIQUE: Multidetector CT imaging of the chest was performed using the standard protocol during bolus administration of intravenous contrast. Multiplanar CT image reconstructions and MIPs were obtained to evaluate the vascular anatomy. CONTRAST:  162mL ISOVUE-370 IOPAMIDOL (ISOVUE-370) INJECTION 76% COMPARISON:  CT chest dated May 08, 2018. FINDINGS: Cardiovascular: Satisfactory opacification of the pulmonary arteries to the segmental level. No evidence of pulmonary embolism. Stable cardiomegaly. No pericardial effusion. Prior CABG. No thoracic aortic aneurysm. Evaluation for aortic dissection is limited due to phase of contrast. Coronary, aortic arch, and Carmelita Amparo vessel atherosclerotic vascular disease. 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 Mediastinum/Nodes: Stable right low jugular and paratracheal lymphadenopathy measuring up to 16 mm. Unchanged 9 mm subcarinal lymph node. Unchanged 10 mm left hilar lymph nodes. No axillary lymphadenopathy. The thyroid gland, trachea, and esophagus demonstrate no significant findings. Lungs/Pleura: Moderate centrilobular emphysema again noted. Unchanged 5 mm pulmonary nodule in the right upper lobe. Previously seen 5 mm nodule in the inferior right upper lobe is no longer identified. Unchanged geographic consolidation and traction bronchiectasis centered about the medial and perihilar right lung. Unchanged chronic right pleural thickening. No pneumothorax, pleural effusion or new airspace consolidation. Upper Abdomen: No acute abnormality. Musculoskeletal: No chest wall abnormality. No acute or significant osseous findings. Chronic T6 and T8 mild compression deformities are unchanged. Review of the MIP images confirms the above findings. IMPRESSION: 1. No evidence of pulmonary embolism. No acute intrathoracic process. 2. Unchanged right perihilar radiation  fibrosis. 3. Stable lower cervical and right paratracheal lymphadenopathy. 4. Previously seen new 5 mm pulmonary nodule in the right upper lobe is no longer identified and was likely inflammatory or infectious. 5. 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 6.  Emphysema (ICD10-J43.9). 7.  Aortic atherosclerosis (ICD10-I70.0). Electronically Signed   By: Titus Dubin M.D.   On: 06/07/2018 21:32     Assessment & Plan    1. NSTEMI - Likely a Type II NSTEMI secondary to demand ischemia in the setting of a COPD exacerbation. History is limited in the setting of his dementia but his wife reports he is usually very active at baseline and just started to develop worsening breathing over the past 3 days which is occurring at rest and with activity. He denies any recent chest pain. - Initial and cyclic troponin values have been elevated to 0.72, 0.77, and 0.60. Initial EKG showed a computer-generated rate of atrial flutter but significant artifact is noted. Repeat tracing this morning shows normal sinus rhythm, heart rate 97, with RBBB and LAFB.  - he already reports substantial improvement in his symptoms since admission with initiation of IV steroids and antibiotics. Would obtain an echocardiogram for initial assessment to evaluate LV function and wall motion. If no significant abnormalities and respiratory status improves with treatment of COPD exacerbation, would favor conservative management in the setting of his NSCLC and dementia. Even if EF is significantly reduced, he would not be a cath candidate at this time given his INR of 4.18 by most recent check. Will consult Pharmacy to follow for if ischemic evaluation is indicated later this admission, INR would need to be <  1.8 prior to proceeding with any invasive procedures and he could be bridged with Heparin.  - continue ASA and cardioselective BB. Will reorder PTA statin therapy.     2. CAD - s/p CABG in 1995, cath in 2008 showing patent LIMA-LAD and SVG-PDA.  - will plan for repeat echo as outlined above for initial assessment.  - continue ASA, BB, and statin therapy.   3. PVD - s/p AAA repair and has known diffuse PAD as he is s/p subclavian stenting and bilateral CEA with R CEA in 2005 and L CEA in 2015.  - most recent carotid dopplers in 03/2018 showed 60-79% RICA stenosis and 5-53% LICA stenosis.  - followed by Vascular Surgery as an outpatient.   4. COPD Exacerbation - currently on Doxycycline, IV steroids, and scheduled nebulizers.  - per admitting team.   5. Dementia - while he is A&Ox3 during today's encounter, he repeats the same story 3 times within a 30 minute span.  - has been continued on PTA Aricept.   6. NSCLC  - s/p radiation, chemotherapy, and immunotherapy with most recent scan showing enlarging lymph nodes currently being followed with repeat imaging scheduled in 06/2018. - followed by Dr. Julien Nordmann as an outpatient.    For questions or updates, please contact [place] Please consult [email or web address] for contact info under Cardiology/STEMI.  Signed, Erma Heritage, PA-C 06/08/2018, 8:57 AM Pager: [phone]  Attending note  Patient seen and discussed with PA Ahmed Prima, I agree with her documentation above. 77 yo male history of AAA repair, CAD with prior CABG, left subclavian stent, right CEA, PAF lung CA stable from last imaging, COPD admitted with SOB and cough. From admission notes severe hypoxia on presentation with O2 sats in 60s. He reports fairly sudden onset of SOB, coughing. No specific chest pain.    INR 4.18 BNP 1166 WBC 7.1 Hgb 15.7 Plt 154 K 4.4 Cr 1.09  Trop 0.72-->0.77-->0.6--> EKG SR, 1st degree av block, RBBB and LAFB CXR no acute process CT PE no PE.  12/2016 echo LVE 54-49%, grade I diastolic dysfunction, mild MR, +PFO Repeat echo pending  Patient presents with COPD exacerbation and severe hypoxia with  reported initial sats in 60%. History of CAD, Trop up to 0.77 trending down. I suspect trop due to severe hypoxia and increased demand in setting of chronic obstructive CAD as opposed to acute occlusive CAD. F/u echo results for any new cardiac dysfunction. Consider ischemic testing pending further data. Noninvasive testing is complicated by his COPD and active exacerbation would want to avoid lexiscan unless lungs significantly improve, would need to avoid dobutamine or exercise with elevated trops, with prior bypass and stent not great candidate for coronary CTA. Essentially would need to consider lexiscan once breathing has improved or cath.  Elevated BNP without evidence of overall fluid overload. F/u echo, would avoid IVFs.   Regarding lung CA from 04/2018 onc note stage IIIa nonsmall cell with RUL nodule and mediastinal lymphadenopathy, completed chemoradiation and immunotherapy currently under observation.  His a scan showed stable disease except for a slightly enlarging lower cervical and upper mediastinal lymph nodes.   Carlyle Dolly MD

## 2018-06-08 NOTE — Evaluation (Signed)
Physical Therapy Evaluation Patient Details Name: [name] [name] MRN: [id number] DOB: 1940-08-18 Today's Date: 06/08/2018   History of Present Illness  [name] [name] is a 77 y.o. male with medical history significant of lung cancer, COPD, coronary artery disease, A. fib, peripheral vascular disease which he sees primary care physician today for 1 week of progressive worsening shortness of breath and dyspnea on exertion.  He denies any chest pain.  He says he has been coughing a lot but not having any fevers.  He denies any lower extremity edema or swelling.  He is currently off of his treatment for his cancer and is supposed to follow-up with his oncologist in January to resume any treatment if needed.  He just completed a year of immune O therapy.  He does not currently smoke on arrival here he was wheezing.  His O2 sats were in the 60s with his primary care physician's office were also low here on arrival at 66% on room air.  He does not require supplemental oxygen at home.  Patient is been given nebulizer treatment Solu-Medrol and oxygen supplementation and says he is feeling markedly better.  He also has a troponin of 0.72.  He has not been eating well over the last week secondary to his shortness of breath.  Patient is being referred for admission for acute hypoxic respiratory failure secondary to COPD exacerbation.    Clinical Impression  Patient demonstrates slow labored movement for sitting up at bedside, transfers and taking steps due to generalized weakness and SOB.  Patient limited to a few steps during transfer to chair with O2 saturation dropping from 96 to 88% while on 8 LPM O2.  Patient tolerated sitting up in chair with family members present at bedside after therapy.  Patient will benefit from continued physical therapy in hospital and recommended venue below to increase strength, balance, endurance for safe ADLs and gait.    Follow Up Recommendations SNF;Supervision/Assistance - 24  hour;Supervision for mobility/OOB    Equipment Recommendations       Recommendations for Other Services       Precautions / Restrictions Precautions Precautions: Fall Restrictions Weight Bearing Restrictions: No      Mobility  Bed Mobility Overal bed mobility: Needs Assistance Bed Mobility: Supine to Sit     Supine to sit: Supervision     General bed mobility comments: slow labored movement  Transfers Overall transfer level: Needs assistance Equipment used: None Transfers: Sit to/from Stand;Stand Pivot Transfers Sit to Stand: Min assist Stand pivot transfers: Min assist       General transfer comment: unsteady labored movement  Ambulation/Gait Ambulation/Gait assistance: Min assist Gait Distance (Feet): 3 Feet Assistive device: None Gait Pattern/deviations: Decreased step length - right;Decreased step length - left;Decreased stance time - left Gait velocity: decreased   General Gait Details: limited to 3-4 slow unsteady labored steps due to BLE weakness and limited mostly due to SOB with O2 saturation dropping from 96% to 88% while on 8 LPM O2  Stairs            Wheelchair Mobility    Modified Rankin (Stroke Patients Only)       Balance Overall balance assessment: Needs assistance Sitting-balance support: Feet supported;No upper extremity supported Sitting balance-Leahy Scale: Good     Standing balance support: During functional activity;No upper extremity supported Standing balance-Leahy Scale: Poor Standing balance comment: fair/poor without AD, has to lean on arm rest of chair  Pertinent Vitals/Pain Pain Assessment: No/denies pain    Home Living Family/patient expects to be discharged to:: Private residence Living Arrangements: Spouse/significant other Available Help at Discharge: Family Type of Home: House Home Access: [place]: One [place]: Environmental consultant - 2  wheels;Bedside commode;Transport chair      Prior Function Level of Independence: Independent         Comments: household and short distanced community ambulator, does not drive     Hand Dominance   Dominant Hand: Right    Extremity/Trunk Assessment   Upper Extremity Assessment Upper Extremity Assessment: Generalized weakness    Lower Extremity Assessment Lower Extremity Assessment: Generalized weakness    Cervical / Trunk Assessment Cervical / Trunk Assessment: Normal  Communication   Communication: No difficulties  Cognition Arousal/Alertness: Awake/alert Behavior During Therapy: WFL for tasks assessed/performed Overall Cognitive Status: Within Functional Limits for tasks assessed                                        General Comments      Exercises     Assessment/Plan    PT Assessment Patient needs continued PT services  PT Problem List Decreased strength;Decreased activity tolerance;Decreased balance;Decreased mobility       PT Treatment Interventions Gait training;Stair training;Functional mobility training;Therapeutic activities;Therapeutic exercise    PT Goals (Current goals can be found in the Care Plan section)  Acute Rehab PT Goals Patient Stated Goal: return home PT Goal Formulation: With patient/family Time For Goal Achievement: 05/24/19 Potential to Achieve Goals: Good    Frequency Min 3X/week   Barriers to discharge        Co-evaluation               AM-PAC PT "6 Clicks" Mobility  Outcome Measure Help needed turning from your back to your side while in a flat bed without using bedrails?: None Help needed moving from lying on your back to sitting on the side of a flat bed without using bedrails?: A Little Help needed moving to and from a bed to a chair (including a wheelchair)?: A Little Help needed standing up from a chair using your arms (e.g., wheelchair or bedside chair)?: A Little Help needed to walk in  hospital room?: A Lot Help needed climbing 3-5 steps with a railing? : A Lot 6 Click Score: 17    End of Session Equipment Utilized During Treatment: Oxygen Activity Tolerance: Patient limited by fatigue;Patient tolerated treatment well Patient left: in chair;with family/visitor present;with call bell/phone within reach Nurse Communication: Mobility status PT Visit Diagnosis: Unsteadiness on feet (R26.81);Other abnormalities of gait and mobility (R26.89);Muscle weakness (generalized) (M62.81)    Time: 2706-2376 PT Time Calculation (min) (ACUTE ONLY): 21 min   Charges:   PT Evaluation $PT Eval Moderate Complexity: 1 Mod PT Treatments $Therapeutic Activity: 23-37 mins        12:24 PM, 06/08/18 Lonell Grandchild, MPT Physical Therapist with [facility] [phone] office [phone] mobile phone

## 2018-06-08 NOTE — Clinical Social Work Note (Signed)
CSW notified by PT that recommendation is for SNF at dc. Met with pt and family at bedside to discuss. Pt states that he does not want to go to SNF at dc. Pt's wife agrees that pt will go home at dc. Offered [facility] PT and they stated that they did not feel he would need this. Pt and family aware that CSW/RN CM will be available if pt changes his mind about care at dc. Will update RN CM and MD.

## 2018-06-08 NOTE — Progress Notes (Signed)
ANTICOAGULATION CONSULT NOTE - Initial Consult  Pharmacy Consult for Coumadin--> Heparin Indication: chest pain/ACS  Allergies  Allergen Reactions  . Neurontin [Gabapentin] Other (See Comments)    Caused confusion  . Imitrex [Sumatriptan] Nausea And Vomiting and Other (See Comments)    "made me like I was having a stoke"  . Lipitor [Atorvastatin] Nausea And Vomiting and Other (See Comments)    INTOLERANCE > MYALGIAS "couldn't get out of the bed ( pt had to crawl out of bed ) I hurt so bad"    Patient Measurements: Height: 5\' 7"  (170.2 cm) Weight: 119 lb 14.9 oz (54.4 kg) IBW/kg (Calculated) : 66.1 HEPARIN DW (KG): 54.4  Vital Signs: Temp: 97.4 F (36.3 C) (12/20 0920) Temp Source: Axillary (12/20 0920) BP: 125/75 (12/20 0900) Pulse Rate: 99 (12/20 0920)  Labs: Recent Labs    06/07/18 1556 06/07/18 1737 06/07/18 2258 06/08/18 0416  HGB  --  15.7  --  15.3  HCT  --  47.2  --  44.8  PLT  --  154  --  116*  LABPROT  --  39.7*  --   --   INR 6.4* 4.18*  --   --   CREATININE  --  1.09  --  0.85  TROPONINI  --  0.72* 0.77* 0.60*    Estimated Creatinine Clearance: 54.2 mL/min (by C-G formula based on SCr of 0.85 mg/dL).   Medical History: Past Medical History:  Diagnosis Date  . Adenocarcinoma of right lung, stage 3 ([place]) 09/20/2016  . Atrial fibrillation ([place])    no hx of reported at preop visit of 04/21/11   . CAD (coronary artery disease)   . Carotid artery occlusion    left carotid endarterectomy  . Chronic kidney disease    AAA repair with reimplant of renals   . CHRONIC OBSTRUCTIVE PULMONARY DISEASE    pt denied at visit of 04/21/11   . CORONARY ARTERY DISEASE   . Dehydration 11/15/2016  . Disorder of left sacroiliac joint 08/24/2016  . DIVERTICULOSIS OF COLON   . Encounter for antineoplastic chemotherapy 10/17/2016  . GERD   . H/O hiatal hernia   . Headache(784.0)    Hx: of years of years  . HYPERTENSION   . HYPOTHYROIDISM   . JOINT EFFUSION, KNEE   .  KNEE, ARTHRITIS, DEGEN./OSTEO    right knee   . Myocardial infarction ([place])    1994   . NEPHROLITHIASIS, HX OF   . OSTEOPOROSIS   . Other dysphagia   . PERIPHERAL VASCULAR DISEASE    AAA - 1994 with reimplant of renals   . Peripheral vascular disease ([place])    subclavian stenosis PTA - 3/08   . Pure hypercholesterolemia   . Renal artery stenosis ([facility])     Medications:  Medications Prior to Admission  Medication Sig Dispense Refill Last Dose  . acetaminophen (TYLENOL) 325 MG tablet Take 325-650 mg by mouth daily as needed for headache (for pain).    Past Week at Unknown time  . bumetanide (BUMEX) 1 MG tablet Take 1 tablet (1 mg total) by mouth daily as needed (swelling). 30 tablet 1 unknown  . donepezil (ARICEPT) 10 MG tablet Take 1 tablet daily (Patient taking differently: Take 10 mg by mouth at bedtime. ) 90 tablet 3 06/06/2018 at Unknown time  . lansoprazole (PREVACID) 15 MG capsule Take 1 capsule (15 mg total) by mouth daily. (Patient taking differently: Take 15 mg by mouth every morning. ) 30 capsule 5 06/07/2018  at Unknown time  . levothyroxine (SYNTHROID, LEVOTHROID) 112 MCG tablet Take 1 tablet (112 mcg total) by mouth daily. (Patient taking differently: Take 112 mcg by mouth every morning. ) 90 tablet 1 06/07/2018 at Unknown time  . linaclotide (LINZESS) 145 MCG CAPS capsule Take 1 capsule (145 mcg total) by mouth daily before breakfast. (Patient taking differently: Take 145 mcg by mouth daily as needed (for constipation). ) 30 capsule 5 unknown  . magic mouthwash w/lidocaine SOLN Composition: 80 cc each of Extra St. Maalox Plus, Diphenhydramine, and Nystatin. Plus 240 cc of 2% viscous lidocaine to make 480 cc. Swish and swallow 2 tsp four times per day. 480 mL 2 unknown  . metoprolol tartrate (LOPRESSOR) 25 MG tablet TAKE ONE-HALF TABLET BY MOUTH TWO TIMES DAILY (Patient taking differently: Take 12.5 mg by mouth 2 (two) times daily. ) 90 tablet 1 06/07/2018 at 800a  . niacin  (SLO-NIACIN) 500 MG tablet Take 3 tablets (1,500 mg total) by mouth at bedtime. 30 tablet 0 06/06/2018 at Unknown time  . nitroGLYCERIN (NITROSTAT) 0.4 MG SL tablet Place 1 tablet (0.4 mg total) under the tongue every 5 (five) minutes as needed for chest pain. 25 tablet 10 unknown  . potassium chloride SA (K-DUR,KLOR-CON) 20 MEQ tablet Take 1 tablet (20 mEq total) by mouth daily. (Patient taking differently: Take 20 mEq by mouth at bedtime. ) 90 tablet 0 06/06/2018 at Unknown time  . rosuvastatin (CRESTOR) 20 MG tablet Take 1 tablet (20 mg total) by mouth daily. (Patient taking differently: Take 20 mg by mouth at bedtime. ) 90 tablet 1 06/06/2018 at Unknown time  . tamsulosin (FLOMAX) 0.4 MG CAPS capsule TAKE ONE CAPSULE BY MOUTH TWICE A DAY. (Patient taking differently: Take 0.4 mg by mouth every morning. ) 60 capsule 11 06/07/2018 at Unknown time  . warfarin (COUMADIN) 5 MG tablet TAKE ONE TABLET ON MONDAY WEDNESDAY AND SATURDAY, TAKE ONE AND ONE-HALF ON SUNDAY TUESDAY FRIDAY (Patient taking differently: Take 5 mg by mouth See admin instructions. ) 111 tablet 0 06/06/2018 at Unknown time  . ALPRAZolam (XANAX) 0.25 MG tablet Take 1 tablet (0.25 mg total) by mouth 3 (three) times daily as needed for anxiety. (Patient not taking: Reported on 06/07/2018) 30 tablet 0 unknown at Unknown time  . methylPREDNISolone (MEDROL DOSEPAK) 4 MG TBPK tablet Use as instructed. (Patient not taking: Reported on 06/07/2018) 21 tablet 0 Not Taking at Unknown time  . nystatin (MYCOSTATIN) 100000 UNIT/ML suspension TAKE 5 ML (500,000 UNITS TOTAL) BY MOUTHFOUR TIMES DAILY. SWISH AND SWALLOW. (Patient not taking: Reported on 06/07/2018) 140 mL 0 Not Taking at Unknown time  . potassium chloride SA (K-DUR,KLOR-CON) 20 MEQ tablet TAKE ONE TABLET (20MEQ TOTAL) BY MOUTH DAILY (Patient not taking: Reported on 06/07/2018) 30 tablet 5 Not Taking at Unknown time    Assessment: 77 y.o. male with past medical history of CAD (s/p CABG in  1995, cath in 2008. She has dementia and NSCLC (s/p radiation, chemotherapy, and immunotherapy with most recent scan showing enlarging lymph nodes currently being followed. He is on coumadin for previous DVT.  Initial and cyclic troponin values have been elevated to 0.72, 0.77, and 0.60. Likely NSTEMI secondary to demand ischemia form COPD exacerbation. Pharmacy asked to start heparin. Elevated INR, will start once INR is <2   .    Goal of Therapy:  HL 0.3-0.7 Monitor platelets by anticoagulation protocol: Yes   Plan:  Daily PT-INR, start heparin infusion once INR is <2 Start heparin infusion at  650 units/hr Check anti-Xa level in 6-8 hours and daily while on heparin Continue to monitor H&H and platelets  Isac Sarna, BS Vena [place], BCPS Clinical Pharmacist Pager [phone] 06/08/2018,10:53 AM

## 2018-06-09 LAB — BASIC METABOLIC PANEL
Anion gap: 9 (ref 5–15)
BUN: 26 mg/dL — ABNORMAL HIGH (ref 8–23)
CO2: 21 mmol/L — ABNORMAL LOW (ref 22–32)
Calcium: 9.3 mg/dL (ref 8.9–10.3)
Chloride: 102 mmol/L (ref 98–111)
Creatinine, Ser: 0.95 mg/dL (ref 0.61–1.24)
GFR calc Af Amer: >60 mL/min (ref >60)
GFR calc non Af Amer: >60 mL/min (ref >60)
Glucose, Bld: 153 mg/dL — ABNORMAL HIGH (ref 70–99)
Potassium: 4.5 mmol/L (ref 3.5–5.1)
Sodium: 132 mmol/L — ABNORMAL LOW (ref 135–145)

## 2018-06-09 LAB — PROTIME-INR
INR: 3.85
Prothrombin Time: 37.2 s — ABNORMAL HIGH (ref 11.4–15.2)

## 2018-06-09 LAB — CBC
HCT: 45.1 % (ref 39.0–52.0)
Hemoglobin: 15.3 g/dL (ref 13.0–17.0)
MCH: 30.7 pg (ref 26.0–34.0)
MCHC: 33.9 g/dL (ref 30.0–36.0)
MCV: 90.4 fL (ref 80.0–100.0)
Platelets: 129 10*3/uL — ABNORMAL LOW (ref 150–400)
RBC: 4.99 MIL/uL (ref 4.22–5.81)
RDW: 15.7 % — ABNORMAL HIGH (ref 11.5–15.5)
WBC: 10.8 10*3/uL — ABNORMAL HIGH (ref 4.0–10.5)
nRBC: 0 % (ref 0.0–0.2)

## 2018-06-09 MED ORDER — IPRATROPIUM-ALBUTEROL 0.5-2.5 (3) MG/3ML IN SOLN
3.0000 mL | Freq: Four times a day (QID) | RESPIRATORY_TRACT | Status: DC
  Administered 2018-06-10 – 2018-06-11 (×5): 3 mL via RESPIRATORY_TRACT
  Filled 2018-06-09 (×6): qty 3

## 2018-06-09 MED ORDER — FUROSEMIDE 10 MG/ML IJ SOLN
40.0000 mg | Freq: Once | INTRAMUSCULAR | Status: AC
  Administered 2018-06-09: 40 mg via INTRAVENOUS
  Filled 2018-06-09: qty 4

## 2018-06-09 NOTE — Progress Notes (Signed)
Paged MD in regards to foley placement and diuresing of PT due to elevated BNP, confirmed CHF, positive CXR, SOB, and increased need of oxygen acute. One time dose of lasix ordered and administered, foley placed at admit per diuresing needs, and SCDs ordered as VTE. Continue to monitor.

## 2018-06-09 NOTE — Progress Notes (Signed)
Patient has been on High flow oxygen 8 liters since before yesterday. His BNP on arrival 1166 -- Kidney function normal range, albumin normal. Nursing noted this an requested a dose of lasix.Marland KitchenHopefully he will be able to come off some of this oxygen. He is having some heart issues which may be causing third spacing. X-ray not showing chf. COPD doesn't appear to be factor in this as he is not actively wheezing or having decreased air flow. He normally does not wear oxygen.

## 2018-06-09 NOTE — Progress Notes (Signed)
PROGRESS NOTE    [name] [name]  YQM:[id number] DOB: Mar 02, 1941 DOA: 06/07/2018 PCP: [name], MD   Brief Narrative:  Per HPI: Leeroy Cha Benfieldis a 77 y.o.malewith medical history significant oflung cancer, COPD, coronary artery disease, A. fib, peripheral vascular disease which he sees primary care physician today for 1 week of progressive worsening shortness of breath and dyspnea on exertion. He denies any chest pain. He says he has been coughing a lot but not having any fevers. He denies any lower extremity edema or swelling. He is currently off of his treatment for his cancer and is supposed to follow-up with his oncologist in January to resume any treatment if needed. He just completed a year of immune O therapy. He does not currently smoke on arrival here he was wheezing. His O2 sats were in the 60s with his primary care physician's office were also low here on arrival at 66% on room air. He does not require supplemental oxygen at home. Patient is been given nebulizer treatment Solu-Medrol and oxygen supplementation and says he is feeling markedly better. He also has a troponin of 0.72. He has not been eating well over the last week secondary to his shortness of breath. Patient is being referred for admission for acute hypoxic respiratory failure secondary to COPD exacerbation.  Patient has been noted to have troponin elevations and has been seen by cardiology with recommendations for heparin drip once INR comes back to normal limits.  He continues to have INR which is starting to decrease with no overt bleeding.  He has had 2D echocardiogram with significant changes and reduction in LVEF with plans for cardiac catheterization once he has stabilized.   Assessment & Plan:   Principal Problem:   Acute respiratory failure with hypoxia (HCC) Active Problems:   Essential hypertension   COPD with acute exacerbation (HCC)   PAF (paroxysmal atrial fibrillation) (HCC)  SOB (shortness of breath)   T1b N2 M0 disease (stage IIIA).adenocarcinoma of the right middle lobe of lung (HCC)   Dementia, neurological ([place])   1. Acute hypoxemic respiratory failure-multifactorial.  Continue to wean oxygen and treat COPD as noted below. 2. NSTEMI with significant cardiomyopathy likely ischemic.  Plan to start heparin drip once INR is less than 2 per pharmacy.  Continue to monitor INR levels and hold Coumadin at this time.  No overt bleeding noted.  Plans for transfer to [facility] for left and right heart catheterization when stable from a respiratory standpoint and INR less than 1.8. 3. Acute COPD exacerbation.  Continue IV steroids, doxycycline, and breathing treatments. 4. CAD status post CABG.  Continue aspirin, beta-blocker, and statin therapy. 5. Peripheral vascular disease status post AAA repair with diffuse PAD.  Followed by vascular surgery outpatient. 6. Dementia.  Continue on Aricept.  Continues to have poor understanding of his condition on account of significant deficits. 7. NSCLC.  Patient is status post radiation chemotherapy and immunotherapy with repeat imaging scheduled on 06/2018.  He is followed by Dr. Earlie Server as an outpatient.   DVT prophylaxis: Currently with elevated INR, to start heparin drip once this is less than 2 Code Status: Full Family Communication: Wife and son at bedside Disposition Plan: Continue to treat COPD exacerbation and optimize respiratory status as well as INR prior to transfer to [facility] for left and right cardiac catheterization hopefully by 12/23.   Consultants:   Cardiology  Procedures:   None  Antimicrobials:   Doxycycline 12/20->   Subjective: Patient seen  and evaluated today with no new acute complaints or concerns. No acute concerns or events noted overnight.  He continues to have high oxygen requirements.  Objective: Vitals:   06/09/18 0909 06/09/18 0956 06/09/18 1000 06/09/18 1146  BP:   94/80   Pulse:   98 95 85  Resp:   (!) 31 (!) 22  Temp:    (!) 97.4 F (36.3 C)  TempSrc:    Axillary  SpO2: 94%  93% 91%  Weight:      Height:        Intake/Output Summary (Last 24 hours) at 06/09/2018 1317 Last data filed at 06/09/2018 1000 Gross per 24 hour  Intake 6 ml  Output 1700 ml  Net -1694 ml   Filed Weights   06/07/18 1652 06/07/18 2351  Weight: 55.8 kg 54.4 kg    Examination:  General exam: Appears calm and comfortable  Respiratory system: Clear to auscultation. Respiratory effort normal.  Currently on 9 L nasal cannula. Cardiovascular system: S1 & S2 heard, RRR. No JVD, murmurs, rubs, gallops or clicks. No pedal edema. Gastrointestinal system: Abdomen is nondistended, soft and nontender. No organomegaly or masses felt. Normal bowel sounds heard. Central nervous system: Alert and oriented. No focal neurological deficits. Extremities: Symmetric 5 x 5 power. Skin: No rashes, lesions or ulcers Psychiatry: Judgement and insight appear normal. Mood & affect appropriate.     Data Reviewed: I have personally reviewed following labs and imaging studies  CBC: Recent Labs  Lab 06/07/18 1737 06/08/18 0416 06/09/18 0512  WBC 7.1 4.7 10.8*  NEUTROABS 6.2 4.2  --   HGB 15.7 15.3 15.3  HCT 47.2 44.8 45.1  MCV 89.6 89.2 90.4  PLT 154 116* 683*   Basic Metabolic Panel: Recent Labs  Lab 06/07/18 1737 06/08/18 0416 06/09/18 0512  NA 133* 131* 132*  K 4.4 4.5 4.5  CL 103 106 102  CO2 19* 16* 21*  GLUCOSE 145* 126* 153*  BUN 18 16 26*  CREATININE 1.09 0.85 0.95  CALCIUM 9.0 8.6* 9.3   GFR: Estimated Creatinine Clearance: 48.5 mL/min (by C-G formula based on SCr of 0.95 mg/dL). Liver Function Tests: Recent Labs  Lab 06/07/18 1737  AST 29  ALT 19  ALKPHOS 110  BILITOT 0.8  PROT 7.2  ALBUMIN 3.8   No results for input(s): LIPASE, AMYLASE in the last 168 hours. No results for input(s): AMMONIA in the last 168 hours. Coagulation Profile: Recent Labs  Lab  06/07/18 1556 06/07/18 1737 06/09/18 0512  INR 6.4* 4.18* 3.85   Cardiac Enzymes: Recent Labs  Lab 06/07/18 1737 06/07/18 2258 06/08/18 0416 06/08/18 1102  TROPONINI 0.72* 0.77* 0.60* 0.56*   BNP (last 3 results) No results for input(s): PROBNP in the last 8760 hours. HbA1C: No results for input(s): HGBA1C in the last 72 hours. CBG: No results for input(s): GLUCAP in the last 168 hours. Lipid Profile: No results for input(s): CHOL, HDL, LDLCALC, TRIG, CHOLHDL, LDLDIRECT in the last 72 hours. Thyroid Function Tests: No results for input(s): TSH, T4TOTAL, FREET4, T3FREE, THYROIDAB in the last 72 hours. Anemia Panel: No results for input(s): VITAMINB12, FOLATE, FERRITIN, TIBC, IRON, RETICCTPCT in the last 72 hours. Sepsis Labs: No results for input(s): PROCALCITON, LATICACIDVEN in the last 168 hours.  Recent Results (from the past 240 hour(s))  MRSA PCR Screening     Status: None   Collection Time: 06/08/18  7:37 AM  Result Value Ref Range Status   MRSA by PCR NEGATIVE NEGATIVE Final  Comment:        The GeneXpert MRSA Assay (FDA approved for NASAL specimens only), is one component of a comprehensive MRSA colonization surveillance program. It is not intended to diagnose MRSA infection nor to guide or monitor treatment for MRSA infections. Performed at [facility], [address] [place], [name] [address]          Radiology Studies: Dg Chest 2 View  Result Date: 06/07/2018 CLINICAL DATA:  Hypoxia.  Shortness of breath. EXAM: CHEST - 2 VIEW COMPARISON:  CT scan May 08, 2017 FINDINGS: Opacity in the right perihilar region is consistent with post therapeutic/radiation changes and is similar since November 2019. No pneumothorax. The cardiomediastinal silhouette is stable. No new nodules or masses. Mild scarring in the left base. No new suspicious infiltrate. IMPRESSION: Post therapeutic changes on the right.  No acute interval changes. Electronically Signed    By: Dorise Bullion III M.D   On: 06/07/2018 17:28   Ct Angio Chest Pe W/cm &/or Wo Cm  Result Date: 06/07/2018 CLINICAL DATA:  Hypoxia. Shortness of breath for the past week. Right lung cancer. EXAM: CT ANGIOGRAPHY CHEST WITH CONTRAST TECHNIQUE: Multidetector CT imaging of the chest was performed using the standard protocol during bolus administration of intravenous contrast. Multiplanar CT image reconstructions and MIPs were obtained to evaluate the vascular anatomy. CONTRAST:  125mL ISOVUE-370 IOPAMIDOL (ISOVUE-370) INJECTION 76% COMPARISON:  CT chest dated May 08, 2018. FINDINGS: Cardiovascular: Satisfactory opacification of the pulmonary arteries to the segmental level. No evidence of pulmonary embolism. Stable cardiomegaly. No pericardial effusion. Prior CABG. No thoracic aortic aneurysm. Evaluation for aortic dissection is limited due to phase of contrast. Coronary, aortic arch, and branch vessel atherosclerotic vascular disease. 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 Mediastinum/Nodes: Stable right low jugular and paratracheal lymphadenopathy measuring up to 16 mm. Unchanged 9 mm subcarinal lymph node. Unchanged 10 mm left hilar lymph nodes. No axillary lymphadenopathy. The thyroid gland, trachea, and esophagus demonstrate no significant findings. Lungs/Pleura: Moderate centrilobular emphysema again noted. Unchanged 5 mm pulmonary nodule in the right upper lobe. Previously seen 5 mm nodule in the inferior right upper lobe is no longer identified. Unchanged geographic consolidation and traction bronchiectasis centered about the medial and perihilar right lung. Unchanged chronic right pleural thickening. No pneumothorax, pleural effusion or new airspace consolidation. Upper Abdomen: No acute abnormality. Musculoskeletal: No chest wall abnormality. No acute or significant osseous findings. Chronic T6 and T8 mild compression  deformities are unchanged. Review of the MIP images confirms the above findings. IMPRESSION: 1. No evidence of pulmonary embolism. No acute intrathoracic process. 2. Unchanged right perihilar radiation fibrosis. 3. Stable lower cervical and right paratracheal lymphadenopathy. 4. Previously seen new 5 mm pulmonary nodule in the right upper lobe is no longer identified and was likely inflammatory or infectious. 5. 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 6.  Emphysema (ICD10-J43.9). 7.  Aortic atherosclerosis (ICD10-I70.0). Electronically Signed   By: Titus Dubin M.D.   On: 06/07/2018 21:32        Scheduled Meds: . aspirin EC  81 mg Oral Daily  . donepezil  10 mg Oral QHS  . doxycycline  100 mg Oral Q12H  . feeding supplement (ENSURE ENLIVE)  237 mL Oral BID BM  . ipratropium-albuterol  3 mL Nebulization Q6H  . methylPREDNISolone (SOLU-MEDROL) injection  80 mg Intravenous Q12H  .  metoprolol tartrate  12.5 mg Oral BID  . multivitamin with minerals  1 tablet Oral Q1200  . rosuvastatin  20 mg Oral QHS  . sodium chloride flush  3 mL Intravenous Q12H  . tamsulosin  0.4 mg Oral q morning - 10a   Continuous Infusions: . sodium chloride       LOS: 2 days    Time spent: 30 minutes    Cattie Tineo Darleen Crocker, DO [facility] Pager [phone]  If 7PM-7AM, please contact night-coverage [email or web address] Password [email or web address] 06/09/2018, 1:17 PM

## 2018-06-09 NOTE — Progress Notes (Signed)
[place] for Coumadin--> Heparin Indication: chest pain/ACS  Allergies  Allergen Reactions  . Neurontin [Gabapentin] Other (See Comments)    Caused confusion  . Imitrex [Sumatriptan] Nausea And Vomiting and Other (See Comments)    "made me like I was having a stoke"  . Lipitor [Atorvastatin] Nausea And Vomiting and Other (See Comments)    INTOLERANCE > MYALGIAS "couldn't get out of the bed ( pt had to crawl out of bed ) I hurt so bad"    Patient Measurements: Height: 5\' 7"  (170.2 cm) Weight: 119 lb 14.9 oz (54.4 kg) IBW/kg (Calculated) : 66.1 HEPARIN DW (KG): 54.4  Vital Signs: Temp: 97.5 F (36.4 C) (12/21 0742) Temp Source: Oral (12/21 0742) BP: 141/82 (12/21 0600) Pulse Rate: 91 (12/21 0742)  Labs: Recent Labs    06/07/18 1556  06/07/18 1737 06/07/18 2258 06/08/18 0416 06/08/18 1102 06/09/18 0512  HGB  --    < > 15.7  --  15.3  --  15.3  HCT  --   --  47.2  --  44.8  --  45.1  PLT  --   --  154  --  116*  --  129*  LABPROT  --   --  39.7*  --   --   --  37.2*  INR 6.4*  --  4.18*  --   --   --  3.85  CREATININE  --   --  1.09  --  0.85  --  0.95  TROPONINI  --    < > 0.72* 0.77* 0.60* 0.56*  --    < > = values in this interval not displayed.    Estimated Creatinine Clearance: 48.5 mL/min (by C-G formula based on SCr of 0.95 mg/dL).   Medical History: Past Medical History:  Diagnosis Date  . Adenocarcinoma of right lung, stage 3 ([place]) 09/20/2016  . Atrial fibrillation ([place])    no hx of reported at preop visit of 04/21/11   . CAD (coronary artery disease)   . Carotid artery occlusion    left carotid endarterectomy  . Chronic kidney disease    AAA repair with reimplant of renals   . CHRONIC OBSTRUCTIVE PULMONARY DISEASE    pt denied at visit of 04/21/11   . CORONARY ARTERY DISEASE   . Dehydration 11/15/2016  . Disorder of left sacroiliac joint 08/24/2016  . DIVERTICULOSIS OF COLON   . Encounter for antineoplastic  chemotherapy 10/17/2016  . GERD   . H/O hiatal hernia   . Headache(784.0)    Hx: of years of years  . HYPERTENSION   . HYPOTHYROIDISM   . JOINT EFFUSION, KNEE   . KNEE, ARTHRITIS, DEGEN./OSTEO    right knee   . Myocardial infarction ([place])    1994   . NEPHROLITHIASIS, HX OF   . OSTEOPOROSIS   . Other dysphagia   . PERIPHERAL VASCULAR DISEASE    AAA - 1994 with reimplant of renals   . Peripheral vascular disease ([place])    subclavian stenosis PTA - 3/08   . Pure hypercholesterolemia   . Renal artery stenosis (HCC)     Medications:  Medications Prior to Admission  Medication Sig Dispense Refill Last Dose  . acetaminophen (TYLENOL) 325 MG tablet Take 325-650 mg by mouth daily as needed for headache (for pain).    Past Week at Unknown time  . bumetanide (BUMEX) 1 MG tablet Take 1 tablet (1 mg total) by mouth daily as needed (  swelling). 30 tablet 1 unknown  . donepezil (ARICEPT) 10 MG tablet Take 1 tablet daily (Patient taking differently: Take 10 mg by mouth at bedtime. ) 90 tablet 3 06/06/2018 at Unknown time  . lansoprazole (PREVACID) 15 MG capsule Take 1 capsule (15 mg total) by mouth daily. (Patient taking differently: Take 15 mg by mouth every morning. ) 30 capsule 5 06/07/2018 at Unknown time  . levothyroxine (SYNTHROID, LEVOTHROID) 112 MCG tablet Take 1 tablet (112 mcg total) by mouth daily. (Patient taking differently: Take 112 mcg by mouth every morning. ) 90 tablet 1 06/07/2018 at Unknown time  . linaclotide (LINZESS) 145 MCG CAPS capsule Take 1 capsule (145 mcg total) by mouth daily before breakfast. (Patient taking differently: Take 145 mcg by mouth daily as needed (for constipation). ) 30 capsule 5 unknown  . magic mouthwash w/lidocaine SOLN Composition: 80 cc each of Extra St. Maalox Plus, Diphenhydramine, and Nystatin. Plus 240 cc of 2% viscous lidocaine to make 480 cc. Swish and swallow 2 tsp four times per day. 480 mL 2 unknown  . metoprolol tartrate (LOPRESSOR) 25 MG tablet  TAKE ONE-HALF TABLET BY MOUTH TWO TIMES DAILY (Patient taking differently: Take 12.5 mg by mouth 2 (two) times daily. ) 90 tablet 1 06/07/2018 at 800a  . niacin (SLO-NIACIN) 500 MG tablet Take 3 tablets (1,500 mg total) by mouth at bedtime. 30 tablet 0 06/06/2018 at Unknown time  . nitroGLYCERIN (NITROSTAT) 0.4 MG SL tablet Place 1 tablet (0.4 mg total) under the tongue every 5 (five) minutes as needed for chest pain. 25 tablet 10 unknown  . potassium chloride SA (K-DUR,KLOR-CON) 20 MEQ tablet Take 1 tablet (20 mEq total) by mouth daily. (Patient taking differently: Take 20 mEq by mouth at bedtime. ) 90 tablet 0 06/06/2018 at Unknown time  . rosuvastatin (CRESTOR) 20 MG tablet Take 1 tablet (20 mg total) by mouth daily. (Patient taking differently: Take 20 mg by mouth at bedtime. ) 90 tablet 1 06/06/2018 at Unknown time  . tamsulosin (FLOMAX) 0.4 MG CAPS capsule TAKE ONE CAPSULE BY MOUTH TWICE A DAY. (Patient taking differently: Take 0.4 mg by mouth every morning. ) 60 capsule 11 06/07/2018 at Unknown time  . warfarin (COUMADIN) 5 MG tablet TAKE ONE TABLET ON MONDAY WEDNESDAY AND SATURDAY, TAKE ONE AND ONE-HALF ON SUNDAY TUESDAY FRIDAY (Patient taking differently: Take 5 mg by mouth See admin instructions. ) 111 tablet 0 06/06/2018 at Unknown time  . ALPRAZolam (XANAX) 0.25 MG tablet Take 1 tablet (0.25 mg total) by mouth 3 (three) times daily as needed for anxiety. (Patient not taking: Reported on 06/07/2018) 30 tablet 0 unknown at Unknown time  . methylPREDNISolone (MEDROL DOSEPAK) 4 MG TBPK tablet Use as instructed. (Patient not taking: Reported on 06/07/2018) 21 tablet 0 Not Taking at Unknown time  . nystatin (MYCOSTATIN) 100000 UNIT/ML suspension TAKE 5 ML (500,000 UNITS TOTAL) BY MOUTHFOUR TIMES DAILY. SWISH AND SWALLOW. (Patient not taking: Reported on 06/07/2018) 140 mL 0 Not Taking at Unknown time  . potassium chloride SA (K-DUR,KLOR-CON) 20 MEQ tablet TAKE ONE TABLET (20MEQ TOTAL) BY MOUTH DAILY  (Patient not taking: Reported on 06/07/2018) 30 tablet 5 Not Taking at Unknown time    Assessment: 77 y.o. male with past medical history of CAD (s/p CABG in 1995, cath in 2008. She has dementia and NSCLC (s/p radiation, chemotherapy, and immunotherapy with most recent scan showing enlarging lymph nodes currently being followed. He is on coumadin for previous DVT.  Initial and cyclic troponin values  have been elevated to 0.72, 0.77, and 0.60. Likely NSTEMI secondary to demand ischemia form COPD exacerbation. Pharmacy asked to start heparin. Elevated INR which is trending down but still elevated. Will start heparin once INR is <2   .    Goal of Therapy:  HL 0.3-0.7 Monitor platelets by anticoagulation protocol: Yes   Plan:  Daily PT-INR, start heparin infusion once INR is <2 Start heparin infusion at 650 units/hr Check anti-Xa level in 6-8 hours and daily while on heparin Continue to monitor H&H and platelets  Isac Sarna, BS [place]ena [place], BCPS Clinical Pharmacist Pager [phone] 06/09/2018,8:06 AM

## 2018-06-10 LAB — PROTIME-INR
INR: 2.38
Prothrombin Time: 25.7 seconds — ABNORMAL HIGH (ref 11.4–15.2)

## 2018-06-10 MED ORDER — ORAL CARE MOUTH RINSE
15.0000 mL | Freq: Two times a day (BID) | OROMUCOSAL | Status: DC
  Administered 2018-06-10 – 2018-06-15 (×10): 15 mL via OROMUCOSAL

## 2018-06-10 MED ORDER — METHYLPREDNISOLONE SODIUM SUCC 40 MG IJ SOLR
40.0000 mg | Freq: Two times a day (BID) | INTRAMUSCULAR | Status: DC
  Administered 2018-06-10 – 2018-06-13 (×8): 40 mg via INTRAVENOUS
  Filled 2018-06-10 (×8): qty 1

## 2018-06-10 NOTE — Progress Notes (Signed)
[facility] for Coumadin--> Heparin Indication: chest pain/ACS  Allergies  Allergen Reactions  . Neurontin [Gabapentin] Other (See Comments)    Caused confusion  . Imitrex [Sumatriptan] Nausea And Vomiting and Other (See Comments)    "made me like I was having a stoke"  . Lipitor [Atorvastatin] Nausea And Vomiting and Other (See Comments)    INTOLERANCE > MYALGIAS "couldn't get out of the bed ( pt had to crawl out of bed ) I hurt so bad"    Patient Measurements: Height: 5\' 7"  (170.2 cm) Weight: 124 lb 1.9 oz (56.3 kg) IBW/kg (Calculated) : 66.1 HEPARIN DW (KG): 54.4  Vital Signs: Temp: 97.2 F (36.2 C) (12/22 0738) Temp Source: Oral (12/22 0738) BP: 129/79 (12/22 0600) Pulse Rate: 93 (12/22 0738)  Labs: Recent Labs    06/07/18 1737 06/07/18 2258 06/08/18 0416 06/08/18 1102 06/09/18 0512 06/10/18 0453  HGB 15.7  --  15.3  --  15.3  --   HCT 47.2  --  44.8  --  45.1  --   PLT 154  --  116*  --  129*  --   LABPROT 39.7*  --   --   --  37.2* 25.7*  INR 4.18*  --   --   --  3.85 2.38  CREATININE 1.09  --  0.85  --  0.95  --   TROPONINI 0.72* 0.77* 0.60* 0.56*  --   --     Estimated Creatinine Clearance: 50.2 mL/min (by C-G formula based on SCr of 0.95 mg/dL).   Medical History: Past Medical History:  Diagnosis Date  . Adenocarcinoma of right lung, stage 3 ([place]) 09/20/2016  . Atrial fibrillation ([place])    no hx of reported at preop visit of 04/21/11   . CAD (coronary artery disease)   . Carotid artery occlusion    left carotid endarterectomy  . Chronic kidney disease    AAA repair with reimplant of renals   . CHRONIC OBSTRUCTIVE PULMONARY DISEASE    pt denied at visit of 04/21/11   . CORONARY ARTERY DISEASE   . Dehydration 11/15/2016  . Disorder of left sacroiliac joint 08/24/2016  . DIVERTICULOSIS OF COLON   . Encounter for antineoplastic chemotherapy 10/17/2016  . GERD   . H/O hiatal hernia   . Headache(784.0)    Hx: of  years of years  . HYPERTENSION   . HYPOTHYROIDISM   . JOINT EFFUSION, KNEE   . KNEE, ARTHRITIS, DEGEN./OSTEO    right knee   . Myocardial infarction ([place])    1994   . NEPHROLITHIASIS, HX OF   . OSTEOPOROSIS   . Other dysphagia   . PERIPHERAL VASCULAR DISEASE    AAA - 1994 with reimplant of renals   . Peripheral vascular disease ([place])    subclavian stenosis PTA - 3/08   . Pure hypercholesterolemia   . Renal artery stenosis (HCC)     Medications:  Medications Prior to Admission  Medication Sig Dispense Refill Last Dose  . acetaminophen (TYLENOL) 325 MG tablet Take 325-650 mg by mouth daily as needed for headache (for pain).    Past Week at Unknown time  . bumetanide (BUMEX) 1 MG tablet Take 1 tablet (1 mg total) by mouth daily as needed (swelling). 30 tablet 1 unknown  . donepezil (ARICEPT) 10 MG tablet Take 1 tablet daily (Patient taking differently: Take 10 mg by mouth at bedtime. ) 90 tablet 3 06/06/2018 at Unknown time  . lansoprazole (  PREVACID) 15 MG capsule Take 1 capsule (15 mg total) by mouth daily. (Patient taking differently: Take 15 mg by mouth every morning. ) 30 capsule 5 06/07/2018 at Unknown time  . levothyroxine (SYNTHROID, LEVOTHROID) 112 MCG tablet Take 1 tablet (112 mcg total) by mouth daily. (Patient taking differently: Take 112 mcg by mouth every morning. ) 90 tablet 1 06/07/2018 at Unknown time  . linaclotide (LINZESS) 145 MCG CAPS capsule Take 1 capsule (145 mcg total) by mouth daily before breakfast. (Patient taking differently: Take 145 mcg by mouth daily as needed (for constipation). ) 30 capsule 5 unknown  . magic mouthwash w/lidocaine SOLN Composition: 80 cc each of Extra St. Maalox Plus, Diphenhydramine, and Nystatin. Plus 240 cc of 2% viscous lidocaine to make 480 cc. Swish and swallow 2 tsp four times per day. 480 mL 2 unknown  . metoprolol tartrate (LOPRESSOR) 25 MG tablet TAKE ONE-HALF TABLET BY MOUTH TWO TIMES DAILY (Patient taking differently: Take 12.5  mg by mouth 2 (two) times daily. ) 90 tablet 1 06/07/2018 at 800a  . niacin (SLO-NIACIN) 500 MG tablet Take 3 tablets (1,500 mg total) by mouth at bedtime. 30 tablet 0 06/06/2018 at Unknown time  . nitroGLYCERIN (NITROSTAT) 0.4 MG SL tablet Place 1 tablet (0.4 mg total) under the tongue every 5 (five) minutes as needed for chest pain. 25 tablet 10 unknown  . potassium chloride SA (K-DUR,KLOR-CON) 20 MEQ tablet Take 1 tablet (20 mEq total) by mouth daily. (Patient taking differently: Take 20 mEq by mouth at bedtime. ) 90 tablet 0 06/06/2018 at Unknown time  . rosuvastatin (CRESTOR) 20 MG tablet Take 1 tablet (20 mg total) by mouth daily. (Patient taking differently: Take 20 mg by mouth at bedtime. ) 90 tablet 1 06/06/2018 at Unknown time  . tamsulosin (FLOMAX) 0.4 MG CAPS capsule TAKE ONE CAPSULE BY MOUTH TWICE A DAY. (Patient taking differently: Take 0.4 mg by mouth every morning. ) 60 capsule 11 06/07/2018 at Unknown time  . warfarin (COUMADIN) 5 MG tablet TAKE ONE TABLET ON MONDAY WEDNESDAY AND SATURDAY, TAKE ONE AND ONE-HALF ON SUNDAY TUESDAY FRIDAY (Patient taking differently: Take 5 mg by mouth See admin instructions. ) 111 tablet 0 06/06/2018 at Unknown time  . ALPRAZolam (XANAX) 0.25 MG tablet Take 1 tablet (0.25 mg total) by mouth 3 (three) times daily as needed for anxiety. (Patient not taking: Reported on 06/07/2018) 30 tablet 0 unknown at Unknown time  . methylPREDNISolone (MEDROL DOSEPAK) 4 MG TBPK tablet Use as instructed. (Patient not taking: Reported on 06/07/2018) 21 tablet 0 Not Taking at Unknown time  . nystatin (MYCOSTATIN) 100000 UNIT/ML suspension TAKE 5 ML (500,000 UNITS TOTAL) BY MOUTHFOUR TIMES DAILY. SWISH AND SWALLOW. (Patient not taking: Reported on 06/07/2018) 140 mL 0 Not Taking at Unknown time  . potassium chloride SA (K-DUR,KLOR-CON) 20 MEQ tablet TAKE ONE TABLET (20MEQ TOTAL) BY MOUTH DAILY (Patient not taking: Reported on 06/07/2018) 30 tablet 5 Not Taking at Unknown time     Assessment: 77 y.o. male with past medical history of CAD (s/p CABG in 1995, cath in 2008. She has dementia and NSCLC (s/p radiation, chemotherapy, and immunotherapy with most recent scan showing enlarging lymph nodes currently being followed. He is on coumadin for previous DVT.  Initial and cyclic troponin values have been elevated to 0.72, 0.77, and 0.60. Likely NSTEMI secondary to demand ischemia form COPD exacerbation. Pharmacy asked to start heparin. Elevated INR which is trending down but still therapeutic, likely start in AM. Will start  heparin once INR is <2   .    Goal of Therapy:  HL 0.3-0.7 Monitor platelets by anticoagulation protocol: Yes   Plan:  Daily PT-INR, start heparin infusion once INR is <2 Start heparin infusion at 650 units/hr Check anti-Xa level in 6-8 hours and daily while on heparin Continue to monitor H&H and platelets  Isac Sarna, BS [place], BCPS Clinical Pharmacist Pager [phone] 06/10/2018,8:51 AM

## 2018-06-10 NOTE — Progress Notes (Signed)
Patient has been able to decrease oxygen 93 on 3lpm

## 2018-06-10 NOTE — Progress Notes (Signed)
PROGRESS NOTE    [name] [name]  OZD:[id number] DOB: 05-03-1941 DOA: 06/07/2018 PCP: [name], MD   Brief Narrative:  Per HPI: [name] [name] a 77 y.o.malewith medical history significant oflung cancer, COPD, coronary artery disease, A. fib, peripheral vascular disease which he sees primary care physician today for 1 week of progressive worsening shortness of breath and dyspnea on exertion. He denies any chest pain. He says he has been coughing a lot but not having any fevers. He denies any lower extremity edema or swelling. He is currently off of his treatment for his cancer and is supposed to follow-up with his oncologist in January to resume any treatment if needed. He just completed a year of immune O therapy. He does not currently smoke on arrival here he was wheezing. His O2 sats were in the 60s with his primary care physician's office were also low here on arrival at 66% on room air. He does not require supplemental oxygen at home. Patient is been given nebulizer treatment Solu-Medrol and oxygen supplementation and says he is feeling markedly better. He also has a troponin of 0.72. He has not been eating well over the last week secondary to his shortness of breath. Patient is being referred for admission for acute hypoxic respiratory failure secondary to COPD exacerbation.  Patient has been noted to have troponin elevations and has been seen by cardiology with recommendations for heparin drip once INR comes back to normal limits.  He continues to have INR which is starting to decrease with no overt bleeding.  He has had 2D echocardiogram with significant changes and reduction in LVEF with plans for cardiac catheterization once he has stabilized.  Assessment & Plan:   Principal Problem:   Acute respiratory failure with hypoxia (HCC) Active Problems:   Essential hypertension   COPD with acute exacerbation (HCC)   PAF (paroxysmal atrial fibrillation) (HCC)  SOB (shortness of breath)   T1b N2 M0 disease (stage IIIA).adenocarcinoma of the right middle lobe of lung (HCC)   Dementia, neurological ([place])  1. Acute hypoxemic respiratory failure-multifactorial; improved after diuresis.  Continue to wean oxygen and treat COPD as noted below.  Also noted to have a component of CHF which is contributing. 2. NSTEMI with significant cardiomyopathy likely ischemic.  Plan to start heparin drip once INR is less than 2 per pharmacy.  Continue to monitor INR levels and hold Coumadin at this time.  No overt bleeding noted.  Plans for transfer to [facility] for left and right heart catheterization when stable from a respiratory standpoint and INR less than 1.8. Hopefully by am. 3. Acute COPD exacerbation-improving.  Continue IV steroids which will be weaned today, doxycycline, and breathing treatments. 4. CAD status post CABG.  Continue aspirin, beta-blocker, and statin therapy. 5. Peripheral vascular disease status post AAA repair with diffuse PAD.  Followed by vascular surgery outpatient. 6. Dementia.  Continue on Aricept.  Continues to have poor understanding of his condition on account of significant deficits. 7. NSCLC.  Patient is status post radiation chemotherapy and immunotherapy with repeat imaging scheduled on 06/2018.  He is followed by Dr. Earlie Server as an outpatient.   DVT prophylaxis: Currently with elevated INR, to start heparin drip once this is less than 2 Code Status: Full Family Communication: Wife and son at bedside Disposition Plan: Continue to treat COPD exacerbation and optimize respiratory status as well as INR prior to transfer to [facility] for left and right cardiac catheterization hopefully by 12/23 once  INR less than 1.8 and on a heparin drip and respiratory situation stabilized further.   Consultants:   Cardiology  Procedures:   None  Antimicrobials:   Doxycycline 12/20->  Subjective: Patient seen and evaluated today with  no new acute complaints or concerns. No acute concerns or events noted overnight.  He complains of some dry mouth and has been given Lasix overnight with good diuresis noted and improvement in oxygenation with nasal cannula 3 L.  Objective: Vitals:   06/10/18 0538 06/10/18 0600 06/10/18 0738 06/10/18 0900  BP:  129/79    Pulse:  88 93   Resp:  18 16   Temp: (!) 97.5 F (36.4 C)  (!) 97.2 F (36.2 C)   TempSrc:   Oral   SpO2:  92% 91% 92%  Weight:      Height:        Intake/Output Summary (Last 24 hours) at 06/10/2018 1106 Last data filed at 06/10/2018 0500 Gross per 24 hour  Intake 243 ml  Output 3150 ml  Net -2907 ml   Filed Weights   06/07/18 1652 06/07/18 2351 06/10/18 0500  Weight: 55.8 kg 54.4 kg 56.3 kg    Examination:  General exam: Appears calm and comfortable  Respiratory system: Clear to auscultation. Respiratory effort normal.  Currently on 3 L nasal cannula. Cardiovascular system: S1 & S2 heard, RRR. No JVD, murmurs, rubs, gallops or clicks. No pedal edema. Gastrointestinal system: Abdomen is nondistended, soft and nontender. No organomegaly or masses felt. Normal bowel sounds heard. Central nervous system: Alert and awake. Extremities: Symmetric 5 x 5 power. Skin: No rashes, lesions or ulcers Psychiatry: Judgement and insight appear normal. Mood & affect appropriate.     Data Reviewed: I have personally reviewed following labs and imaging studies  CBC: Recent Labs  Lab 06/07/18 1737 06/08/18 0416 06/09/18 0512  WBC 7.1 4.7 10.8*  NEUTROABS 6.2 4.2  --   HGB 15.7 15.3 15.3  HCT 47.2 44.8 45.1  MCV 89.6 89.2 90.4  PLT 154 116* 948*   Basic Metabolic Panel: Recent Labs  Lab 06/07/18 1737 06/08/18 0416 06/09/18 0512  NA 133* 131* 132*  K 4.4 4.5 4.5  CL 103 106 102  CO2 19* 16* 21*  GLUCOSE 145* 126* 153*  BUN 18 16 26*  CREATININE 1.09 0.85 0.95  CALCIUM 9.0 8.6* 9.3   GFR: Estimated Creatinine Clearance: 50.2 mL/min (by C-G formula  based on SCr of 0.95 mg/dL). Liver Function Tests: Recent Labs  Lab 06/07/18 1737  AST 29  ALT 19  ALKPHOS 110  BILITOT 0.8  PROT 7.2  ALBUMIN 3.8   No results for input(s): LIPASE, AMYLASE in the last 168 hours. No results for input(s): AMMONIA in the last 168 hours. Coagulation Profile: Recent Labs  Lab 06/07/18 1556 06/07/18 1737 06/09/18 0512 06/10/18 0453  INR 6.4* 4.18* 3.85 2.38   Cardiac Enzymes: Recent Labs  Lab 06/07/18 1737 06/07/18 2258 06/08/18 0416 06/08/18 1102  TROPONINI 0.72* 0.77* 0.60* 0.56*   BNP (last 3 results) No results for input(s): PROBNP in the last 8760 hours. HbA1C: No results for input(s): HGBA1C in the last 72 hours. CBG: No results for input(s): GLUCAP in the last 168 hours. Lipid Profile: No results for input(s): CHOL, HDL, LDLCALC, TRIG, CHOLHDL, LDLDIRECT in the last 72 hours. Thyroid Function Tests: No results for input(s): TSH, T4TOTAL, FREET4, T3FREE, THYROIDAB in the last 72 hours. Anemia Panel: No results for input(s): VITAMINB12, FOLATE, FERRITIN, TIBC, IRON, RETICCTPCT in the  last 72 hours. Sepsis Labs: No results for input(s): PROCALCITON, LATICACIDVEN in the last 168 hours.  Recent Results (from the past 240 hour(s))  MRSA PCR Screening     Status: None   Collection Time: 06/08/18  7:37 AM  Result Value Ref Range Status   MRSA by PCR NEGATIVE NEGATIVE Final    Comment:        The GeneXpert MRSA Assay (FDA approved for NASAL specimens only), is one component of a comprehensive MRSA colonization surveillance program. It is not intended to diagnose MRSA infection nor to guide or monitor treatment for MRSA infections. Performed at [facility], [address] [place], [place] [address]          Radiology Studies: No results found.      Scheduled Meds: . aspirin EC  81 mg Oral Daily  . donepezil  10 mg Oral QHS  . doxycycline  100 mg Oral Q12H  . feeding supplement (ENSURE ENLIVE)  237 mL Oral BID  BM  . ipratropium-albuterol  3 mL Nebulization Q6H WA  . mouth rinse  15 mL Mouth Rinse BID  . methylPREDNISolone (SOLU-MEDROL) injection  40 mg Intravenous Q12H  . metoprolol tartrate  12.5 mg Oral BID  . multivitamin with minerals  1 tablet Oral Q1200  . rosuvastatin  20 mg Oral QHS  . sodium chloride flush  3 mL Intravenous Q12H  . tamsulosin  0.4 mg Oral q morning - 10a   Continuous Infusions: . sodium chloride       LOS: 3 days    Time spent: 30 minutes    Joshva Labreck Darleen Crocker, DO [facility] Pager [phone]  If 7PM-7AM, please contact night-coverage [email or web address] Password [facility] 06/10/2018, 11:06 AM

## 2018-06-10 NOTE — Progress Notes (Signed)
From CCU this afternoon.  Alert and oriented x 4 with some forgetfulness.  Daughter has been at bedside and wife to return to spend night.  IV attempt to left forearm causing hematoma.  Ice pack applied.

## 2018-06-11 DIAGNOSIS — I429 Cardiomyopathy, unspecified: Secondary | ICD-10-CM

## 2018-06-11 LAB — CBC
HEMATOCRIT: 42.7 % (ref 39.0–52.0)
Hemoglobin: 14.6 g/dL (ref 13.0–17.0)
MCH: 30.9 pg (ref 26.0–34.0)
MCHC: 34.2 g/dL (ref 30.0–36.0)
MCV: 90.3 fL (ref 80.0–100.0)
NRBC: 0 % (ref 0.0–0.2)
Platelets: 128 10*3/uL — ABNORMAL LOW (ref 150–400)
RBC: 4.73 MIL/uL (ref 4.22–5.81)
RDW: 15.4 % (ref 11.5–15.5)
WBC: 9.3 10*3/uL (ref 4.0–10.5)

## 2018-06-11 LAB — BASIC METABOLIC PANEL
Anion gap: 11 (ref 5–15)
BUN: 31 mg/dL — ABNORMAL HIGH (ref 8–23)
CHLORIDE: 96 mmol/L — AB (ref 98–111)
CO2: 23 mmol/L (ref 22–32)
Calcium: 9.1 mg/dL (ref 8.9–10.3)
Creatinine, Ser: 0.91 mg/dL (ref 0.61–1.24)
GFR calc non Af Amer: >60 mL/min (ref >60)
Glucose, Bld: 131 mg/dL — ABNORMAL HIGH (ref 70–99)
Potassium: 4.5 mmol/L (ref 3.5–5.1)
SODIUM: 130 mmol/L — AB (ref 135–145)

## 2018-06-11 LAB — MAGNESIUM: Magnesium: 1.9 mg/dL (ref 1.7–2.4)

## 2018-06-11 LAB — PROTIME-INR
INR: 2.03
Prothrombin Time: 22.7 seconds — ABNORMAL HIGH (ref 11.4–15.2)

## 2018-06-11 MED ORDER — IPRATROPIUM-ALBUTEROL 0.5-2.5 (3) MG/3ML IN SOLN
3.0000 mL | Freq: Three times a day (TID) | RESPIRATORY_TRACT | Status: DC
  Administered 2018-06-12 – 2018-06-15 (×10): 3 mL via RESPIRATORY_TRACT
  Filled 2018-06-11 (×10): qty 3

## 2018-06-11 NOTE — Progress Notes (Signed)
Patient no longer receiving IV lasix at this time. Requested order to discontinue foley catheter. Patient stated that he self catheterizes at home. Will keep foley in at this time. MD notified.

## 2018-06-11 NOTE — Progress Notes (Signed)
[facility] for Coumadin--> Heparin Indication: chest pain/ACS  Allergies  Allergen Reactions  . Neurontin [Gabapentin] Other (See Comments)    Caused confusion  . Imitrex [Sumatriptan] Nausea And Vomiting and Other (See Comments)    "made me like I was having a stoke"  . Lipitor [Atorvastatin] Nausea And Vomiting and Other (See Comments)    INTOLERANCE > MYALGIAS "couldn't get out of the bed ( pt had to crawl out of bed ) I hurt so bad"    Patient Measurements: Height: 5\' 7"  (170.2 cm) Weight: 121 lb 14.6 oz (55.3 kg) IBW/kg (Calculated) : 66.1 HEPARIN DW (KG): 55.3  Vital Signs: Temp: 97.5 F (36.4 C) (12/23 0632) Temp Source: Oral (12/23 8119) BP: 125/77 (12/23 1478) Pulse Rate: 78 (12/23 0632)  Labs: Recent Labs    06/08/18 1102 06/09/18 0512 06/10/18 0453 06/11/18 0544  HGB  --  15.3  --  14.6  HCT  --  45.1  --  42.7  PLT  --  129*  --  128*  LABPROT  --  37.2* 25.7* 22.7*  INR  --  3.85 2.38 2.03  CREATININE  --  0.95  --  0.91  TROPONINI 0.56*  --   --   --     Estimated Creatinine Clearance: 51.5 mL/min (by C-G formula based on SCr of 0.91 mg/dL).   Medical History: Past Medical History:  Diagnosis Date  . Adenocarcinoma of right lung, stage 3 ([place]) 09/20/2016  . Atrial fibrillation ([place])    no hx of reported at preop visit of 04/21/11   . CAD (coronary artery disease)   . Carotid artery occlusion    left carotid endarterectomy  . Chronic kidney disease    AAA repair with reimplant of renals   . CHRONIC OBSTRUCTIVE PULMONARY DISEASE    pt denied at visit of 04/21/11   . CORONARY ARTERY DISEASE   . Dehydration 11/15/2016  . Disorder of left sacroiliac joint 08/24/2016  . DIVERTICULOSIS OF COLON   . Encounter for antineoplastic chemotherapy 10/17/2016  . GERD   . H/O hiatal hernia   . Headache(784.0)    Hx: of years of years  . HYPERTENSION   . HYPOTHYROIDISM   . JOINT EFFUSION, KNEE   . KNEE, ARTHRITIS,  DEGEN./OSTEO    right knee   . Myocardial infarction ([place])    1994   . NEPHROLITHIASIS, HX OF   . OSTEOPOROSIS   . Other dysphagia   . PERIPHERAL VASCULAR DISEASE    AAA - 1994 with reimplant of renals   . Peripheral vascular disease ([place])    subclavian stenosis PTA - 3/08   . Pure hypercholesterolemia   . Renal artery stenosis (HCC)     Medications:  Medications Prior to Admission  Medication Sig Dispense Refill Last Dose  . acetaminophen (TYLENOL) 325 MG tablet Take 325-650 mg by mouth daily as needed for headache (for pain).    Past Week at Unknown time  . bumetanide (BUMEX) 1 MG tablet Take 1 tablet (1 mg total) by mouth daily as needed (swelling). 30 tablet 1 unknown  . donepezil (ARICEPT) 10 MG tablet Take 1 tablet daily (Patient taking differently: Take 10 mg by mouth at bedtime. ) 90 tablet 3 06/06/2018 at Unknown time  . lansoprazole (PREVACID) 15 MG capsule Take 1 capsule (15 mg total) by mouth daily. (Patient taking differently: Take 15 mg by mouth every morning. ) 30 capsule 5 06/07/2018 at Unknown time  .  levothyroxine (SYNTHROID, LEVOTHROID) 112 MCG tablet Take 1 tablet (112 mcg total) by mouth daily. (Patient taking differently: Take 112 mcg by mouth every morning. ) 90 tablet 1 06/07/2018 at Unknown time  . linaclotide (LINZESS) 145 MCG CAPS capsule Take 1 capsule (145 mcg total) by mouth daily before breakfast. (Patient taking differently: Take 145 mcg by mouth daily as needed (for constipation). ) 30 capsule 5 unknown  . magic mouthwash w/lidocaine SOLN Composition: 80 cc each of Extra St. Maalox Plus, Diphenhydramine, and Nystatin. Plus 240 cc of 2% viscous lidocaine to make 480 cc. Swish and swallow 2 tsp four times per day. 480 mL 2 unknown  . metoprolol tartrate (LOPRESSOR) 25 MG tablet TAKE ONE-HALF TABLET BY MOUTH TWO TIMES DAILY (Patient taking differently: Take 12.5 mg by mouth 2 (two) times daily. ) 90 tablet 1 06/07/2018 at 800a  . niacin (SLO-NIACIN) 500 MG  tablet Take 3 tablets (1,500 mg total) by mouth at bedtime. 30 tablet 0 06/06/2018 at Unknown time  . nitroGLYCERIN (NITROSTAT) 0.4 MG SL tablet Place 1 tablet (0.4 mg total) under the tongue every 5 (five) minutes as needed for chest pain. 25 tablet 10 unknown  . potassium chloride SA (K-DUR,KLOR-CON) 20 MEQ tablet Take 1 tablet (20 mEq total) by mouth daily. (Patient taking differently: Take 20 mEq by mouth at bedtime. ) 90 tablet 0 06/06/2018 at Unknown time  . rosuvastatin (CRESTOR) 20 MG tablet Take 1 tablet (20 mg total) by mouth daily. (Patient taking differently: Take 20 mg by mouth at bedtime. ) 90 tablet 1 06/06/2018 at Unknown time  . tamsulosin (FLOMAX) 0.4 MG CAPS capsule TAKE ONE CAPSULE BY MOUTH TWICE A DAY. (Patient taking differently: Take 0.4 mg by mouth every morning. ) 60 capsule 11 06/07/2018 at Unknown time  . warfarin (COUMADIN) 5 MG tablet TAKE ONE TABLET ON MONDAY WEDNESDAY AND SATURDAY, TAKE ONE AND ONE-HALF ON SUNDAY TUESDAY FRIDAY (Patient taking differently: Take 5 mg by mouth See admin instructions. ) 111 tablet 0 06/06/2018 at Unknown time  . ALPRAZolam (XANAX) 0.25 MG tablet Take 1 tablet (0.25 mg total) by mouth 3 (three) times daily as needed for anxiety. (Patient not taking: Reported on 06/07/2018) 30 tablet 0 unknown at Unknown time  . methylPREDNISolone (MEDROL DOSEPAK) 4 MG TBPK tablet Use as instructed. (Patient not taking: Reported on 06/07/2018) 21 tablet 0 Not Taking at Unknown time  . nystatin (MYCOSTATIN) 100000 UNIT/ML suspension TAKE 5 ML (500,000 UNITS TOTAL) BY MOUTHFOUR TIMES DAILY. SWISH AND SWALLOW. (Patient not taking: Reported on 06/07/2018) 140 mL 0 Not Taking at Unknown time  . potassium chloride SA (K-DUR,KLOR-CON) 20 MEQ tablet TAKE ONE TABLET (20MEQ TOTAL) BY MOUTH DAILY (Patient not taking: Reported on 06/07/2018) 30 tablet 5 Not Taking at Unknown time    Assessment: 77 y.o. male with past medical history of CAD (s/p CABG in 1995, cath in 2008.  She has dementia and NSCLC (s/p radiation, chemotherapy, and immunotherapy with most recent scan showing enlarging lymph nodes currently being followed. He is on coumadin for previous DVT.  Initial and cyclic troponin values have been elevated to 0.72, 0.77, and 0.60. Likely NSTEMI secondary to demand ischemia form COPD exacerbation. Pharmacy asked to start heparin. INR is trending down but still therapeutic at 2.03. Will start heparin once INR is <2   .    Goal of Therapy:  HL 0.3-0.7 Monitor platelets by anticoagulation protocol: Yes   Plan:  Daily PT-INR, start heparin infusion once INR is <2 Start  heparin infusion at 650 units/hr (when INR is < 2) Check anti-Xa level in 6-8 hours and daily while on heparin Continue to monitor H&H and platelets  Isac Sarna, BS [place], BCPS Clinical Pharmacist Pager [phone] 06/11/2018,7:48 AM

## 2018-06-11 NOTE — H&P (View-Only) (Signed)
Progress Note  Patient Name: [name] [name] Date of Encounter: 06/11/2018  Primary Cardiologist: Dr. Jenkins Rouge  Subjective   Sitting in bedside chair.  No chest pain or shortness of breath at rest.  Inpatient Medications    Scheduled Meds: . aspirin EC  81 mg Oral Daily  . donepezil  10 mg Oral QHS  . doxycycline  100 mg Oral Q12H  . feeding supplement (ENSURE ENLIVE)  237 mL Oral BID BM  . ipratropium-albuterol  3 mL Nebulization Q6H WA  . mouth rinse  15 mL Mouth Rinse BID  . methylPREDNISolone (SOLU-MEDROL) injection  40 mg Intravenous Q12H  . metoprolol tartrate  12.5 mg Oral BID  . multivitamin with minerals  1 tablet Oral Q1200  . rosuvastatin  20 mg Oral QHS  . sodium chloride flush  3 mL Intravenous Q12H  . tamsulosin  0.4 mg Oral q morning - 10a   Continuous Infusions: . sodium chloride     PRN Meds: sodium chloride, acetaminophen, albuterol, ALPRAZolam, bumetanide, nitroGLYCERIN, sodium chloride flush   Vital Signs    Vitals:   06/10/18 2153 06/11/18 0632 06/11/18 0829 06/11/18 1013  BP:  125/77  123/76  Pulse:  78  80  Resp:  18    Temp:  (!) 97.5 F (36.4 C)    TempSrc:  Oral    SpO2: 94% 97% 93%   Weight:      Height:        Intake/Output Summary (Last 24 hours) at 06/11/2018 1150 Last data filed at 06/11/2018 0911 Gross per 24 hour  Intake 480 ml  Output 250 ml  Net 230 ml   Filed Weights   06/07/18 2351 06/10/18 0500 06/10/18 1709  Weight: 54.4 kg 56.3 kg 55.3 kg    Telemetry    Sinus rhythm.  Personally reviewed.  ECG    Tracing from 06/08/2018 shows a sinus rhythm with right bundle branch block and left anterior fascicular block.  Personally reviewed.  Physical Exam   GEN:  Elderly male.  No acute distress.   Neck: No JVD. Cardiac: RRR, soft systolic murmur, no gallop. Respiratory: Nonlabored.  Prolonged expiratory phase without wheeze. GI: Soft, nontender, bowel sounds present. MS: No edema; No deformity. Neuro:   Nonfocal. Psych: Alert and oriented x 3. Normal affect.  Labs    Chemistry Recent Labs  Lab 06/07/18 1737 06/08/18 0416 06/09/18 0512 06/11/18 0544  NA 133* 131* 132* 130*  K 4.4 4.5 4.5 4.5  CL 103 106 102 96*  CO2 19* 16* 21* 23  GLUCOSE 145* 126* 153* 131*  BUN 18 16 26* 31*  CREATININE 1.09 0.85 0.95 0.91  CALCIUM 9.0 8.6* 9.3 9.1  PROT 7.2  --   --   --   ALBUMIN 3.8  --   --   --   AST 29  --   --   --   ALT 19  --   --   --   ALKPHOS 110  --   --   --   BILITOT 0.8  --   --   --   GFRNONAA >60 >60 >60 >60  GFRAA >60 >60 >60 >60  ANIONGAP 11 9 9 11      Hematology Recent Labs  Lab 06/08/18 0416 06/09/18 0512 06/11/18 0544  WBC 4.7 10.8* 9.3  RBC 5.02 4.99 4.73  HGB 15.3 15.3 14.6  HCT 44.8 45.1 42.7  MCV 89.2 90.4 90.3  MCH 30.5 30.7 30.9  MCHC  34.2 33.9 34.2  RDW 15.6* 15.7* 15.4  PLT 116* 129* 128*    Cardiac Enzymes Recent Labs  Lab 06/07/18 1737 06/07/18 2258 06/08/18 0416 06/08/18 1102  TROPONINI 0.72* 0.77* 0.60* 0.56*   No results for input(s): TROPIPOC in the last 168 hours.   BNP Recent Labs  Lab 06/07/18 1737  BNP 1,166.0*     Radiology    No results found.  Cardiac Studies   Echocardiogram 06/08/2018: Study Conclusions  - Left ventricle: The cavity size was normal. Wall thickness was   normal. Systolic function was moderately reduced. The estimated   ejection fraction was in the range of 35% to 40%. Diffuse   hypokinesis. The study is not technically sufficient to allow   evaluation of LV diastolic function. - Aortic valve: Mildly calcified annulus. Trileaflet; mildly   thickened leaflets. - Right ventricle: The ventricular septum is flattened in systole   suggesting RV pressure overload. The cavity size was severely   dilated. Systolic function was moderately reduced. - Right atrium: The atrial septum bows to the left suggesting   elevated RA pressure. The atrium was mildly dilated. - Atrial septum: No defect or  patent foramen ovale was identified. - Tricuspid valve: There was mild-moderate regurgitation. - Pulmonary arteries: Systolic pressure was moderately increased.   PA peak pressure: 50 mm Hg (S).  Patient Profile     77 y.o. male with a history of CAD status post CABG in 1995 with patent bypass grafts as of 2008, AAA status post repair, PAD status post previous subclavian and carotid intervention, hyperlipidemia, hypertension, dementia, non-small cell lung cancer status post radiation and chemotherapy.  He is currently admitted with recent worsening shortness of breath and elevated troponin I levels.  Assessment & Plan    1.  Elevated troponin I levels, 0.72, 0.77, 0.60.  Possibly demand ischemia although cannot exclude NSTEMI based on recent worsening symptoms.  He was evaluated by Dr. Harl Bowie with plan for right and left heart catheterization in light of newly documented cardiomyopathy.  2.  Secondary cardiomyopathy, LVEF 35 to 40% range by diffuse hypokinesis per recent echocardiogram.  3.  CAD status post CABG, patent bypass grafts as of 2008.  4.  COPD as well as NSCLC with recent hypoxic respiratory failure.  He is currently being treated with antibiotics and IV steroids.  Status post radiation, chemotherapy, and immunotherapy.  He follows with Dr. Julien Nordmann.  5.  Dementia.  Patient alert and oriented but does have trouble with short-term memory.  He has been on Aricept as an outpatient.  Reportedly, relatively functional as per family.  6.  Paroxysmal atrial fibrillation, on Coumadin.  Supratherapeutic INR noted at presentation with downward drift since holding Coumadin.  INR 2.03 today.  I reviewed the patient's chart and plan as discussed per Dr. Harl Bowie and Dr. Manuella Ghazi.  Patient and family members amenable to diagnostic cardiac catheterization to assess for any potential revascularization options.  INR is nearly below 2, patient is to be transferred from [facility] to the hospitalist service  at [facility].  Cardiology service will continue to follow in consultation.  May potentially be able to undergo a right and left heart catheterization tomorrow presuming INR continues to drift downward.  Initiate heparin when INR less than 2, keep him n.p.o. after midnight for now.  Signed, Rozann Lesches, MD  06/11/2018, 11:50 AM

## 2018-06-11 NOTE — Progress Notes (Signed)
OT Screen Note  Patient Details Name: [name] [name] MRN: [id number] DOB: 1941/02/21   Cancelled Treatment:    Reason Eval/Treat Not Completed: OT screened, no needs identified, will sign off. OT orders received. Pt's chart reviewed. Patient evaluated by PT who recommends SNF at discharge. Family declines SNF and will take patient home without therapy services. Patient's family capable to care for patient as home as needed. No OT follow up needed. Thank-you for the referral.      Ailene Ravel, OTR/L,CBIS  [phone]  06/11/2018, 8:49 AM

## 2018-06-11 NOTE — Care Management Important Message (Signed)
Important Message  Patient Details  Name: QUENTIN SHOREY MRN: [id number] Date of Birth: 04/13/1941   Medicare Important Message Given:  Yes    Shelda Altes 06/11/2018, 12:04 PM

## 2018-06-11 NOTE — Progress Notes (Signed)
Progress Note  Patient Name: [name] [name] Date of Encounter: 06/11/2018  Primary Cardiologist: Dr. Jenkins Rouge  Subjective   Sitting in bedside chair.  No chest pain or shortness of breath at rest.  Inpatient Medications    Scheduled Meds: . aspirin EC  81 mg Oral Daily  . donepezil  10 mg Oral QHS  . doxycycline  100 mg Oral Q12H  . feeding supplement (ENSURE ENLIVE)  237 mL Oral BID BM  . ipratropium-albuterol  3 mL Nebulization Q6H WA  . mouth rinse  15 mL Mouth Rinse BID  . methylPREDNISolone (SOLU-MEDROL) injection  40 mg Intravenous Q12H  . metoprolol tartrate  12.5 mg Oral BID  . multivitamin with minerals  1 tablet Oral Q1200  . rosuvastatin  20 mg Oral QHS  . sodium chloride flush  3 mL Intravenous Q12H  . tamsulosin  0.4 mg Oral q morning - 10a   Continuous Infusions: . sodium chloride     PRN Meds: sodium chloride, acetaminophen, albuterol, ALPRAZolam, bumetanide, nitroGLYCERIN, sodium chloride flush   Vital Signs    Vitals:   06/10/18 2153 06/11/18 0632 06/11/18 0829 06/11/18 1013  BP:  125/77  123/76  Pulse:  78  80  Resp:  18    Temp:  (!) 97.5 F (36.4 C)    TempSrc:  Oral    SpO2: 94% 97% 93%   Weight:      Height:        Intake/Output Summary (Last 24 hours) at 06/11/2018 1150 Last data filed at 06/11/2018 0911 Gross per 24 hour  Intake 480 ml  Output 250 ml  Net 230 ml   Filed Weights   06/07/18 2351 06/10/18 0500 06/10/18 1709  Weight: 54.4 kg 56.3 kg 55.3 kg    Telemetry    Sinus rhythm.  Personally reviewed.  ECG    Tracing from 06/08/2018 shows a sinus rhythm with right bundle branch block and left anterior fascicular block.  Personally reviewed.  Physical Exam   GEN:  Elderly male.  No acute distress.   Neck: No JVD. Cardiac: RRR, soft systolic murmur, no gallop. Respiratory: Nonlabored.  Prolonged expiratory phase without wheeze. GI: Soft, nontender, bowel sounds present. MS: No edema; No deformity. Neuro:   Nonfocal. Psych: Alert and oriented x 3. Normal affect.  Labs    Chemistry Recent Labs  Lab 06/07/18 1737 06/08/18 0416 06/09/18 0512 06/11/18 0544  NA 133* 131* 132* 130*  K 4.4 4.5 4.5 4.5  CL 103 106 102 96*  CO2 19* 16* 21* 23  GLUCOSE 145* 126* 153* 131*  BUN 18 16 26* 31*  CREATININE 1.09 0.85 0.95 0.91  CALCIUM 9.0 8.6* 9.3 9.1  PROT 7.2  --   --   --   ALBUMIN 3.8  --   --   --   AST 29  --   --   --   ALT 19  --   --   --   ALKPHOS 110  --   --   --   BILITOT 0.8  --   --   --   GFRNONAA >60 >60 >60 >60  GFRAA >60 >60 >60 >60  ANIONGAP 11 9 9 11      Hematology Recent Labs  Lab 06/08/18 0416 06/09/18 0512 06/11/18 0544  WBC 4.7 10.8* 9.3  RBC 5.02 4.99 4.73  HGB 15.3 15.3 14.6  HCT 44.8 45.1 42.7  MCV 89.2 90.4 90.3  MCH 30.5 30.7 30.9  MCHC  34.2 33.9 34.2  RDW 15.6* 15.7* 15.4  PLT 116* 129* 128*    Cardiac Enzymes Recent Labs  Lab 06/07/18 1737 06/07/18 2258 06/08/18 0416 06/08/18 1102  TROPONINI 0.72* 0.77* 0.60* 0.56*   No results for input(s): TROPIPOC in the last 168 hours.   BNP Recent Labs  Lab 06/07/18 1737  BNP 1,166.0*     Radiology    No results found.  Cardiac Studies   Echocardiogram 06/08/2018: Study Conclusions  - Left ventricle: The cavity size was normal. Wall thickness was   normal. Systolic function was moderately reduced. The estimated   ejection fraction was in the range of 35% to 40%. Diffuse   hypokinesis. The study is not technically sufficient to allow   evaluation of LV diastolic function. - Aortic valve: Mildly calcified annulus. Trileaflet; mildly   thickened leaflets. - Right ventricle: The ventricular septum is flattened in systole   suggesting RV pressure overload. The cavity size was severely   dilated. Systolic function was moderately reduced. - Right atrium: The atrial septum bows to the left suggesting   elevated RA pressure. The atrium was mildly dilated. - Atrial septum: No defect or  patent foramen ovale was identified. - Tricuspid valve: There was mild-moderate regurgitation. - Pulmonary arteries: Systolic pressure was moderately increased.   PA peak pressure: 50 mm Hg (S).  Patient Profile     77 y.o. male with a history of CAD status post CABG in 1995 with patent bypass grafts as of 2008, AAA status post repair, PAD status post previous subclavian and carotid intervention, hyperlipidemia, hypertension, dementia, non-small cell lung cancer status post radiation and chemotherapy.  He is currently admitted with recent worsening shortness of breath and elevated troponin I levels.  Assessment & Plan    1.  Elevated troponin I levels, 0.72, 0.77, 0.60.  Possibly demand ischemia although cannot exclude NSTEMI based on recent worsening symptoms.  He was evaluated by Dr. Harl Bowie with plan for right and left heart catheterization in light of newly documented cardiomyopathy.  2.  Secondary cardiomyopathy, LVEF 35 to 40% range by diffuse hypokinesis per recent echocardiogram.  3.  CAD status post CABG, patent bypass grafts as of 2008.  4.  COPD as well as NSCLC with recent hypoxic respiratory failure.  He is currently being treated with antibiotics and IV steroids.  Status post radiation, chemotherapy, and immunotherapy.  He follows with Dr. Julien Nordmann.  5.  Dementia.  Patient alert and oriented but does have trouble with short-term memory.  He has been on Aricept as an outpatient.  Reportedly, relatively functional as per family.  6.  Paroxysmal atrial fibrillation, on Coumadin.  Supratherapeutic INR noted at presentation with downward drift since holding Coumadin.  INR 2.03 today.  I reviewed the patient's chart and plan as discussed per Dr. Harl Bowie and Dr. Manuella Ghazi.  Patient and family members amenable to diagnostic cardiac catheterization to assess for any potential revascularization options.  INR is nearly below 2, patient is to be transferred from [facility] to the hospitalist service  at [facility].  Cardiology service will continue to follow in consultation.  May potentially be able to undergo a right and left heart catheterization tomorrow presuming INR continues to drift downward.  Initiate heparin when INR less than 2, keep him n.p.o. after midnight for now.  Signed, Rozann Lesches, MD  06/11/2018, 11:50 AM

## 2018-06-11 NOTE — Progress Notes (Signed)
PROGRESS NOTE    [name] [name]  QQP:[id number] DOB: 1940/07/23 DOA: 06/07/2018 PCP: [name], MD   Brief Narrative:  Per HPI: [name] [name] a 77 y.o.malewith medical history significant oflung cancer, COPD, coronary artery disease, A. fib, peripheral vascular disease which he sees primary care physician today for 1 week of progressive worsening shortness of breath and dyspnea on exertion. He denies any chest pain. He says he has been coughing a lot but not having any fevers. He denies any lower extremity edema or swelling. He is currently off of his treatment for his cancer and is supposed to follow-up with his oncologist in January to resume any treatment if needed. He just completed a year of immune O therapy. He does not currently smoke on arrival here he was wheezing. His O2 sats were in the 60s with his primary care physician's office were also low here on arrival at 66% on room air. He does not require supplemental oxygen at home. Patient is been given nebulizer treatment Solu-Medrol and oxygen supplementation and says he is feeling markedly better. He also has a troponin of 0.72. He has not been eating well over the last week secondary to his shortness of breath. Patient is being referred for admission for acute hypoxic respiratory failure secondary to COPD exacerbation.  Patient has been noted to have troponin elevations and has been seen by cardiology with recommendations for heparin drip once INR comes back to normal limits. He continues to have INR which is starting to decrease with no overt bleeding. He has had 2D echocardiogram with significant changes and reduction in LVEF with plans for cardiac catheterization once he has stabilized.  He will transfer to [facility] for cardiac catheterization today and hopefully this could be done in a.m. once INR is less than 1.8.   Assessment & Plan:   Principal Problem:   Acute respiratory failure with hypoxia  (HCC) Active Problems:   Essential hypertension   COPD with acute exacerbation (HCC)   PAF (paroxysmal atrial fibrillation) (HCC)   SOB (shortness of breath)   T1b N2 M0 disease (stage IIIA).adenocarcinoma of the right middle lobe of lung (HCC)   Dementia, neurological ([place])  1. Acute hypoxemic respiratory failure-multifactorial; improved after diuresis. Continue to wean oxygen and treat COPD as noted below.  Also noted to have a component of CHF which is contributing.  Currently on 5 L nasal cannula. 2. NSTEMIwith significant cardiomyopathy likely ischemic.Plan to start heparin drip once INR is less than 2 per pharmacy. Continue to monitor INR levels and hold Coumadin at this time. No overt bleeding noted. Plans for transfer to [facility] for left and right heart catheterization when stable from a respiratory standpoint and INR less than 1.8. Hopefully by am. 3. Acute COPD exacerbation-improving. Continue IV steroids, doxycycline, and breathing treatments.  He is currently on 5 L nasal cannula. 4. CAD status post CABG. Continue aspirin, beta-blocker, and statin therapy. 5. Peripheral vascular disease status post AAA repair with diffuse PAD. Followed by vascular surgery outpatient. 6. Dementia. Continue on Aricept. Continues to have poor understanding of his condition on account of significant deficits. 7. NSCLC. Patient is status post radiation chemotherapy and immunotherapy with repeat imaging scheduled on 06/2018. He is followed by Dr. Earlie Server as an outpatient.   DVT prophylaxis:Currently with elevated INR, to start heparin drip once this is less than 2 Code Status:Full Family Communication:Wife and son at bedside Disposition Plan:Continue to treat COPD exacerbation and optimize respiratory status as  well as INR prior to transfer to [facility] for left and right cardiac catheterization hopefully by 12/23 once INR less than 1.8 and on a heparin drip and respiratory  situation stabilized further.   Consultants:  Cardiology   Procedures:  None  Antimicrobials:  Doxycycline 12/20->   Subjective: Patient seen and evaluated today with no new acute complaints or concerns. No acute concerns or events noted overnight.  He has severe dementia which limits the amount of history which can be obtained.  Objective: Vitals:   06/10/18 2153 06/11/18 0632 06/11/18 0829 06/11/18 1013  BP:  125/77  123/76  Pulse:  78  80  Resp:  18    Temp:  (!) 97.5 F (36.4 C)    TempSrc:  Oral    SpO2: 94% 97% 93%   Weight:      Height:        Intake/Output Summary (Last 24 hours) at 06/11/2018 1239 Last data filed at 06/11/2018 1100 Gross per 24 hour  Intake 720 ml  Output -  Net 720 ml   Filed Weights   06/07/18 2351 06/10/18 0500 06/10/18 1709  Weight: 54.4 kg 56.3 kg 55.3 kg    Examination:  General exam: Appears calm and comfortable  Respiratory system: Clear to auscultation. Respiratory effort normal.  On 5 L nasal cannula. Cardiovascular system: S1 & S2 heard, RRR. No JVD, murmurs, rubs, gallops or clicks. No pedal edema. Gastrointestinal system: Abdomen is nondistended, soft and nontender. No organomegaly or masses felt. Normal bowel sounds heard. Central nervous system: Alert and awake. Extremities: Symmetric 5 x 5 power. Skin: No rashes, lesions or ulcers Psychiatry: Judgement and insight appear normal. Mood & affect appropriate.     Data Reviewed: I have personally reviewed following labs and imaging studies  CBC: Recent Labs  Lab 06/07/18 1737 06/08/18 0416 06/09/18 0512 06/11/18 0544  WBC 7.1 4.7 10.8* 9.3  NEUTROABS 6.2 4.2  --   --   HGB 15.7 15.3 15.3 14.6  HCT 47.2 44.8 45.1 42.7  MCV 89.6 89.2 90.4 90.3  PLT 154 116* 129* 938*   Basic Metabolic Panel: Recent Labs  Lab 06/07/18 1737 06/08/18 0416 06/09/18 0512 06/11/18 0544  NA 133* 131* 132* 130*  K 4.4 4.5 4.5 4.5  CL 103 106 102 96*  CO2 19* 16* 21*  23  GLUCOSE 145* 126* 153* 131*  BUN 18 16 26* 31*  CREATININE 1.09 0.85 0.95 0.91  CALCIUM 9.0 8.6* 9.3 9.1  MG  --   --   --  1.9   GFR: Estimated Creatinine Clearance: 51.5 mL/min (by C-G formula based on SCr of 0.91 mg/dL). Liver Function Tests: Recent Labs  Lab 06/07/18 1737  AST 29  ALT 19  ALKPHOS 110  BILITOT 0.8  PROT 7.2  ALBUMIN 3.8   No results for input(s): LIPASE, AMYLASE in the last 168 hours. No results for input(s): AMMONIA in the last 168 hours. Coagulation Profile: Recent Labs  Lab 06/07/18 1556 06/07/18 1737 06/09/18 0512 06/10/18 0453 06/11/18 0544  INR 6.4* 4.18* 3.85 2.38 2.03   Cardiac Enzymes: Recent Labs  Lab 06/07/18 1737 06/07/18 2258 06/08/18 0416 06/08/18 1102  TROPONINI 0.72* 0.77* 0.60* 0.56*   BNP (last 3 results) No results for input(s): PROBNP in the last 8760 hours. HbA1C: No results for input(s): HGBA1C in the last 72 hours. CBG: No results for input(s): GLUCAP in the last 168 hours. Lipid Profile: No results for input(s): CHOL, HDL, LDLCALC, TRIG, CHOLHDL, LDLDIRECT  in the last 72 hours. Thyroid Function Tests: No results for input(s): TSH, T4TOTAL, FREET4, T3FREE, THYROIDAB in the last 72 hours. Anemia Panel: No results for input(s): VITAMINB12, FOLATE, FERRITIN, TIBC, IRON, RETICCTPCT in the last 72 hours. Sepsis Labs: No results for input(s): PROCALCITON, LATICACIDVEN in the last 168 hours.  Recent Results (from the past 240 hour(s))  MRSA PCR Screening     Status: None   Collection Time: 06/08/18  7:37 AM  Result Value Ref Range Status   MRSA by PCR NEGATIVE NEGATIVE Final    Comment:        The GeneXpert MRSA Assay (FDA approved for NASAL specimens only), is one component of a comprehensive MRSA colonization surveillance program. It is not intended to diagnose MRSA infection nor to guide or monitor treatment for MRSA infections. Performed at [facility], [address], [place], [place] [address]           Radiology Studies: No results found.      Scheduled Meds: . aspirin EC  81 mg Oral Daily  . donepezil  10 mg Oral QHS  . doxycycline  100 mg Oral Q12H  . feeding supplement (ENSURE ENLIVE)  237 mL Oral BID BM  . ipratropium-albuterol  3 mL Nebulization Q6H WA  . mouth rinse  15 mL Mouth Rinse BID  . methylPREDNISolone (SOLU-MEDROL) injection  40 mg Intravenous Q12H  . metoprolol tartrate  12.5 mg Oral BID  . multivitamin with minerals  1 tablet Oral Q1200  . rosuvastatin  20 mg Oral QHS  . sodium chloride flush  3 mL Intravenous Q12H  . tamsulosin  0.4 mg Oral q morning - 10a   Continuous Infusions: . sodium chloride       LOS: 4 days    Time spent: 30 minutes    Tex Conroy Darleen Crocker, DO [facility] Pager [phone]  If 7PM-7AM, please contact night-coverage [email or web address] Password [facility] 06/11/2018, 12:39 PM

## 2018-06-11 NOTE — Progress Notes (Signed)
Patient arrived, very pleasant, AAOx4, vital signs are all stable, and family is present. Upon exam a reddened area was noted on his sacrum that was not noted in any of his previous exams from transferring facility. Documentation has been made and a foam dressing has been applied. The area has been described as a stage I. RN has discussed with charge RN. One thought is that the  area may be reddened due to riding on the hard gurney from one facility to another, and if so it may resolve with the foam and a more comfortable bed.

## 2018-06-11 NOTE — Progress Notes (Signed)
Report called to Sami, Therapist, sports on 2W at [facility]. [facility] also called for transfer. Patient and his daughter made aware and updated on the plan. Patient belongings packed. Awaiting [facility].

## 2018-06-12 ENCOUNTER — Encounter (HOSPITAL_COMMUNITY)
Admission: EM | Disposition: A | Discharge status: Home Health | Payer: Self-pay | Source: Home / Self Care | Attending: Internal Medicine

## 2018-06-12 DIAGNOSIS — E43 Unspecified severe protein-calorie malnutrition: Secondary | ICD-10-CM

## 2018-06-12 DIAGNOSIS — I2510 Atherosclerotic heart disease of native coronary artery without angina pectoris: Secondary | ICD-10-CM

## 2018-06-12 DIAGNOSIS — I255 Ischemic cardiomyopathy: Secondary | ICD-10-CM

## 2018-06-12 DIAGNOSIS — L8990 Pressure ulcer of unspecified site, unspecified stage: Secondary | ICD-10-CM

## 2018-06-12 HISTORY — PX: RIGHT/LEFT HEART CATH AND CORONARY/GRAFT ANGIOGRAPHY: CATH118267

## 2018-06-12 LAB — BASIC METABOLIC PANEL
Anion gap: 10 (ref 5–15)
Anion gap: 10 (ref 5–15)
BUN: 32 mg/dL — ABNORMAL HIGH (ref 8–23)
BUN: 31 mg/dL — ABNORMAL HIGH (ref 8–23)
CHLORIDE: 95 mmol/L — AB (ref 98–111)
CO2: 24 mmol/L (ref 22–32)
CO2: 26 mmol/L (ref 22–32)
CREATININE: 0.91 mg/dL (ref 0.61–1.24)
Calcium: 9 mg/dL (ref 8.9–10.3)
Calcium: 9 mg/dL (ref 8.9–10.3)
Chloride: 94 mmol/L — ABNORMAL LOW (ref 98–111)
Creatinine, Ser: 0.97 mg/dL (ref 0.61–1.24)
GFR calc Af Amer: >60 mL/min (ref >60)
GFR calc Af Amer: >60 mL/min (ref >60)
GFR calc non Af Amer: >60 mL/min (ref >60)
GFR calc non Af Amer: >60 mL/min (ref >60)
GLUCOSE: 124 mg/dL — AB (ref 70–99)
Glucose, Bld: 147 mg/dL — ABNORMAL HIGH (ref 70–99)
POTASSIUM: 4.7 mmol/L (ref 3.5–5.1)
Potassium: 4.5 mmol/L (ref 3.5–5.1)
Sodium: 129 mmol/L — ABNORMAL LOW (ref 135–145)
Sodium: 130 mmol/L — ABNORMAL LOW (ref 135–145)

## 2018-06-12 LAB — PROCALCITONIN: Procalcitonin: <0.1 ng/mL

## 2018-06-12 LAB — PROTIME-INR
INR: 1.73
Prothrombin Time: 20.1 seconds — ABNORMAL HIGH (ref 11.4–15.2)

## 2018-06-12 LAB — CBC
HCT: 43.5 % (ref 39.0–52.0)
HEMOGLOBIN: 15.3 g/dL (ref 13.0–17.0)
MCH: 30.7 pg (ref 26.0–34.0)
MCHC: 35.2 g/dL (ref 30.0–36.0)
MCV: 87.2 fL (ref 80.0–100.0)
Platelets: 133 10*3/uL — ABNORMAL LOW (ref 150–400)
RBC: 4.99 MIL/uL (ref 4.22–5.81)
RDW: 15.2 % (ref 11.5–15.5)
WBC: 10.2 10*3/uL (ref 4.0–10.5)
nRBC: 0 % (ref 0.0–0.2)

## 2018-06-12 LAB — TSH: TSH: 4.405 u[IU]/mL (ref 0.350–4.500)

## 2018-06-12 LAB — CREATININE, URINE, RANDOM: CREATININE, URINE: 82.82 mg/dL

## 2018-06-12 LAB — OSMOLALITY: Osmolality: 285 mOsm/kg (ref 275–295)

## 2018-06-12 LAB — NA AND K (SODIUM & POTASSIUM), RAND UR
Potassium Urine: 47 mmol/L
Sodium, Ur: 26 mmol/L

## 2018-06-12 LAB — SEDIMENTATION RATE: Sed Rate: 1 mm/hr (ref 0–16)

## 2018-06-12 LAB — OSMOLALITY, URINE: Osmolality, Ur: 730 mOsm/kg (ref 300–900)

## 2018-06-12 LAB — T4, FREE: Free T4: 0.7 ng/dL — ABNORMAL LOW (ref 0.82–1.77)

## 2018-06-12 LAB — URIC ACID: Uric Acid, Serum: 4.5 mg/dL (ref 3.7–8.6)

## 2018-06-12 LAB — C-REACTIVE PROTEIN

## 2018-06-12 SURGERY — RIGHT/LEFT HEART CATH AND CORONARY/GRAFT ANGIOGRAPHY
Anesthesia: LOCAL

## 2018-06-12 MED ORDER — SODIUM CHLORIDE 0.9% FLUSH
3.0000 mL | Freq: Two times a day (BID) | INTRAVENOUS | Status: DC
  Administered 2018-06-12 – 2018-06-14 (×5): 3 mL via INTRAVENOUS

## 2018-06-12 MED ORDER — VERAPAMIL HCL 2.5 MG/ML IV SOLN
INTRAVENOUS | Status: DC | PRN
  Administered 2018-06-12: 10 mL via INTRA_ARTERIAL

## 2018-06-12 MED ORDER — SODIUM CHLORIDE 0.9 % IV SOLN: 250.0000 mL | INTRAVENOUS | Status: DC | PRN

## 2018-06-12 MED ORDER — ASPIRIN 81 MG PO CHEW
81.0000 mg | CHEWABLE_TABLET | ORAL | Status: AC
  Administered 2018-06-12: 81 mg via ORAL
  Filled 2018-06-12: qty 1

## 2018-06-12 MED ORDER — MIDAZOLAM HCL 2 MG/2ML IJ SOLN
INTRAMUSCULAR | Status: DC | PRN
  Administered 2018-06-12 (×2): 0.5 mg via INTRAVENOUS

## 2018-06-12 MED ORDER — FENTANYL CITRATE (PF) 100 MCG/2ML IJ SOLN
INTRAMUSCULAR | Status: AC
  Filled 2018-06-12: qty 2

## 2018-06-12 MED ORDER — ACETAMINOPHEN 325 MG PO TABS
ORAL_TABLET | ORAL | Status: AC
  Filled 2018-06-12: qty 2

## 2018-06-12 MED ORDER — SODIUM CHLORIDE 0.9 % IV SOLN: INTRAVENOUS | Status: AC

## 2018-06-12 MED ORDER — SODIUM CHLORIDE 0.9 % IV SOLN
INTRAVENOUS | Status: DC
  Administered 2018-06-12: 06:00:00 via INTRAVENOUS

## 2018-06-12 MED ORDER — HEPARIN (PORCINE) IN NACL 1000-0.9 UT/500ML-% IV SOLN
INTRAVENOUS | Status: AC
  Filled 2018-06-12: qty 1000

## 2018-06-12 MED ORDER — VERAPAMIL HCL 2.5 MG/ML IV SOLN
INTRAVENOUS | Status: AC
  Filled 2018-06-12: qty 2

## 2018-06-12 MED ORDER — HEPARIN (PORCINE) IN NACL 1000-0.9 UT/500ML-% IV SOLN
INTRAVENOUS | Status: DC | PRN
  Administered 2018-06-12 (×2): 500 mL

## 2018-06-12 MED ORDER — MIDAZOLAM HCL 2 MG/2ML IJ SOLN
INTRAMUSCULAR | Status: AC
  Filled 2018-06-12: qty 2

## 2018-06-12 MED ORDER — SODIUM CHLORIDE 0.9% FLUSH
3.0000 mL | Freq: Two times a day (BID) | INTRAVENOUS | Status: DC
  Administered 2018-06-12: 3 mL via INTRAVENOUS

## 2018-06-12 MED ORDER — SODIUM CHLORIDE 0.9% FLUSH: 3.0000 mL | INTRAVENOUS | Status: DC | PRN

## 2018-06-12 MED ORDER — IOHEXOL 350 MG/ML SOLN
INTRAVENOUS | Status: DC | PRN
  Administered 2018-06-12: 70 mL via INTRAVENOUS

## 2018-06-12 MED ORDER — LIDOCAINE HCL (PF) 1 % IJ SOLN
INTRAMUSCULAR | Status: DC | PRN
  Administered 2018-06-12: 30 mL
  Administered 2018-06-12: 2 mL

## 2018-06-12 MED ORDER — LIDOCAINE-EPINEPHRINE 1 %-1:100000 IJ SOLN
INTRAMUSCULAR | Status: AC
  Filled 2018-06-12: qty 1

## 2018-06-12 MED ORDER — LIDOCAINE HCL (PF) 1 % IJ SOLN
INTRAMUSCULAR | Status: AC
  Filled 2018-06-12: qty 30

## 2018-06-12 MED ORDER — FENTANYL CITRATE (PF) 100 MCG/2ML IJ SOLN
INTRAMUSCULAR | Status: DC | PRN
  Administered 2018-06-12 (×2): 12.5 ug via INTRAVENOUS

## 2018-06-12 MED ORDER — HEPARIN (PORCINE) 25000 UT/250ML-% IV SOLN
650.0000 [IU]/h | INTRAVENOUS | Status: DC
  Administered 2018-06-12: 650 [IU]/h via INTRAVENOUS
  Filled 2018-06-12: qty 250

## 2018-06-12 SURGICAL SUPPLY — 17 items
CATH 5FR JL3.5 JR4 ANG PIG MP (CATHETERS) ×1 IMPLANT
CATH SWAN GANZ 7F STRAIGHT (CATHETERS) ×1 IMPLANT
DEVICE RAD COMP TR BAND LRG (VASCULAR PRODUCTS) ×1 IMPLANT
GLIDESHEATH SLEND SS 6F .021 (SHEATH) ×1 IMPLANT
GUIDEWIRE INQWIRE 1.5J.035X260 (WIRE) IMPLANT
INQWIRE 1.5J .035X260CM (WIRE) ×2
KIT HEART LEFT (KITS) ×2 IMPLANT
PACK CARDIAC CATHETERIZATION (CUSTOM PROCEDURE TRAY) ×2 IMPLANT
SHEATH GLIDE SLENDER 4/5FR (SHEATH) ×1 IMPLANT
SHEATH PINNACLE 5F 10CM (SHEATH) ×1 IMPLANT
SHEATH PINNACLE 7F 10CM (SHEATH) ×1 IMPLANT
TRANSDUCER W/STOPCOCK (MISCELLANEOUS) ×2 IMPLANT
TUBING CIL FLEX 10 FLL-RA (TUBING) ×2 IMPLANT
WIRE AQUATRAK .035X260 ANG (WIRE) ×1 IMPLANT
WIRE EMERALD 3MM-J .025X260CM (WIRE) ×1 IMPLANT
WIRE EMERALD 3MM-J .035X150CM (WIRE) ×1 IMPLANT
WIRE HI TORQ VERSACORE-J 145CM (WIRE) ×1 IMPLANT

## 2018-06-12 NOTE — Progress Notes (Signed)
Site area: rt groin fa and fv sheaths Site Prior to Removal:  Level 0 Pressure Applied For:  Manual:   yes Patient Status During Pull:  stable Post Pull Site:  Level  0 Post Pull Instructions Given:  yes Post Pull Pulses Present:   Rt dp palpable Dressing Applied:  Gauze and tegaderm Bedrest begins @  Comments:

## 2018-06-12 NOTE — Progress Notes (Signed)
[name] [name] Cancellation Note  Patient Details Name: [name] [name] MRN: [id number] DOB: [birth year]   Cancelled Treatment:    Reason Eval/Treat Not Completed: Active bedrest order.  Due to prolonged bleeding at his groin site, [name] [name]'s bed rest was not started until 1415.  [name] [name] will not still be here when bed rest is lifted at 6:15.  [name] [name] will follow up at a later date.    Thanks,  [name] [name], [name] [name], [name] [name]  Acute Rehabilitation [phone] pager [phone] office      Wells Guiles B Dia Donate 06/12/2018, 4:51 PM

## 2018-06-12 NOTE — Interval H&P Note (Signed)
History and Physical Interval Note:  06/12/2018 11:03 AM  [name] [name]  has presented today for cardiac cath with the diagnosis of NSTEMI  The various methods of treatment have been discussed with the patient and family. After consideration of risks, benefits and other options for treatment, the patient has consented to  Procedure(s): RIGHT/LEFT HEART CATH AND CORONARY/GRAFT ANGIOGRAPHY ([name]) as a surgical intervention .  The patient's history has been reviewed, patient examined, no change in status, stable for surgery.  I have reviewed the patient's chart and labs.  Questions were answered to the patient's satisfaction.    Cath Lab Visit (complete for each Cath Lab visit)  Clinical Evaluation Leading to the Procedure:   ACS: Yes.    Non-ACS:    Anginal Classification: CCS III  Anti-ischemic medical therapy: Minimal Therapy (1 class of medications)  Non-Invasive Test Results: No non-invasive testing performed  Prior CABG: Previous CABG        Lauree Chandler

## 2018-06-12 NOTE — Progress Notes (Signed)
PROGRESS NOTE    [name] [name]  PZW:[id number] DOB: 1941-01-16 DOA: 06/07/2018 PCP: [name], MD   Brief Narrative:  Per HPI: [name] [name] a 77 y.o.malewith medical history significant oflung cancer, COPD, coronary artery disease, A. fib, peripheral vascular disease which he sees primary care physician today for 1 week of progressive worsening shortness of breath and dyspnea on exertion. He denies any chest pain. He says he has been coughing a lot but not having any fevers. He denies any lower extremity edema or swelling. He is currently off of his treatment for his cancer and is supposed to follow-up with his oncologist in January to resume any treatment if needed. He just completed a year of immune O therapy. He does not currently smoke on arrival here he was wheezing. His O2 sats were in the 60s with his primary care physician's office were also low here on arrival at 66% on room air. He does not require supplemental oxygen at home. Patient is been given nebulizer treatment Solu-Medrol and oxygen supplementation and says he is feeling markedly better. He also has a troponin of 0.72. He has not been eating well over the last week secondary to his shortness of breath. Patient is being referred for admission for acute hypoxic respiratory failure secondary to COPD exacerbation.  Patient has been noted to have troponin elevations and has been seen by cardiology with recommendations for heparin drip once INR comes back to normal limits. He continues to have INR which is starting to decrease with no overt bleeding. He has had 2D echocardiogram with significant changes and reduction in LVEF with plans for cardiac catheterization once he has stabilized.  He will transfer to [facility] for cardiac catheterization today and hopefully this could be done in a.m. once INR is less than 1.8.   Assessment & Plan:   Principal Problem:   Acute respiratory failure with hypoxia  (HCC) Active Problems:   Essential hypertension   COPD with acute exacerbation (HCC)   PAF (paroxysmal atrial fibrillation) (HCC)   SOB (shortness of breath)   T1b N2 M0 disease (stage IIIA).adenocarcinoma of the right middle lobe of lung (HCC)   Dementia, neurological (HCC)   Protein-calorie malnutrition, severe   Pressure injury of skin   Ischemic cardiomyopathy  1. Acute hypoxemic respiratory failure-multifactorial; improved after diuresis. Continue to wean oxygen and treat COPD as noted below.  Also noted to have a component of CHF which is contributing.  Currently on 5 L nasal cannula. 2. NSTEMIwith significant cardiomyopathy likely ischemic.Plan to start heparin drip once INR is less than 2 per pharmacy. Continue to monitor INR levels and hold Coumadin at this time. No overt bleeding noted.  Underwent left heart cath which showed patent graft.  Medical management recommended by cardiology for now.  Will await further recommendation.  3. Acute COPD exacerbation-improving. Continue IV steroids, doxycycline, and breathing treatments.  He is currently on 5 L nasal cannula. 4. CAD status post CABG. Continue aspirin, beta-blocker, and statin therapy. 5. Peripheral vascular disease status post AAA repair with diffuse PAD. Followed by vascular surgery outpatient. 6. Dementia. Continue on Aricept. Continues to have poor understanding of his condition on account of significant deficits. 7. NSCLC. Patient is status post radiation chemotherapy and immunotherapy with repeat imaging scheduled on 06/2018. He is followed by Dr. Earlie Server as an outpatient. 8. Chronic A. Fib. Continue warfarin.  Resum per pharmacy   DVT prophylaxis: Code Status:Full Family Communication:Wife and son at bedside Disposition Plan:Continue  to treat COPD exacerbation and optimize respiratory status    Consultants:  Cardiology   Procedures:  None  Antimicrobials:  Doxycycline  12/20->   Subjective: Seen after the procedure.  No acute complaint.  No chest pain or shortness of breath.  No nausea no vomiting.  Still severely hypoxic.  Objective: Vitals:   06/12/18 1515 06/12/18 1530 06/12/18 1545 06/12/18 1632  BP: 106/61 (!) 110/53 110/60 94/62  Pulse: 71 62 62 62  Resp: 16 19 17    Temp:    97.6 F (36.4 C)  TempSrc:    Oral  SpO2: 97% 98% 96% 96%  Weight:      Height:        Intake/Output Summary (Last 24 hours) at 06/12/2018 1826 Last data filed at 06/12/2018 1800 Gross per 24 hour  Intake 667.94 ml  Output 800 ml  Net -132.06 ml   Filed Weights   06/10/18 0500 06/10/18 1709 06/12/18 0500  Weight: 56.3 kg 55.3 kg 55.6 kg    Examination:  General exam: Appears calm and comfortable  Respiratory system: Clear to auscultation. Respiratory effort normal.  On 5 L nasal cannula. Cardiovascular system: S1 & S2 heard, RRR. No JVD, murmurs, rubs, gallops or clicks. No pedal edema. Gastrointestinal system: Abdomen is nondistended, soft and nontender. No organomegaly or masses felt. Normal bowel sounds heard. Central nervous system: Alert and awake. Extremities: Symmetric 5 x 5 power. Skin: No rashes, lesions or ulcers Psychiatry: Judgement and insight appear normal. Mood & affect appropriate.     Data Reviewed: I have personally reviewed following labs and imaging studies  CBC: Recent Labs  Lab 06/07/18 1737 06/08/18 0416 06/09/18 0512 06/11/18 0544 06/12/18 0259  WBC 7.1 4.7 10.8* 9.3 10.2  NEUTROABS 6.2 4.2  --   --   --   HGB 15.7 15.3 15.3 14.6 15.3  HCT 47.2 44.8 45.1 42.7 43.5  MCV 89.6 89.2 90.4 90.3 87.2  PLT 154 116* 129* 128* 264*   Basic Metabolic Panel: Recent Labs  Lab 06/08/18 0416 06/09/18 0512 06/11/18 0544 06/12/18 0259 06/12/18 0818  NA 131* 132* 130* 129* 130*  K 4.5 4.5 4.5 4.5 4.7  CL 106 102 96* 95* 94*  CO2 16* 21* 23 24 26   GLUCOSE 126* 153* 131* 147* 124*  BUN 16 26* 31* 32* 31*  CREATININE 0.85 0.95  0.91 0.91 0.97  CALCIUM 8.6* 9.3 9.1 9.0 9.0  MG  --   --  1.9  --   --    GFR: Estimated Creatinine Clearance: 48.6 mL/min (by C-G formula based on SCr of 0.97 mg/dL). Liver Function Tests: Recent Labs  Lab 06/07/18 1737  AST 29  ALT 19  ALKPHOS 110  BILITOT 0.8  PROT 7.2  ALBUMIN 3.8   No results for input(s): LIPASE, AMYLASE in the last 168 hours. No results for input(s): AMMONIA in the last 168 hours. Coagulation Profile: Recent Labs  Lab 06/07/18 1737 06/09/18 0512 06/10/18 0453 06/11/18 0544 06/12/18 0259  INR 4.18* 3.85 2.38 2.03 1.73   Cardiac Enzymes: Recent Labs  Lab 06/07/18 1737 06/07/18 2258 06/08/18 0416 06/08/18 1102  TROPONINI 0.72* 0.77* 0.60* 0.56*   BNP (last 3 results) No results for input(s): PROBNP in the last 8760 hours. HbA1C: No results for input(s): HGBA1C in the last 72 hours. CBG: No results for input(s): GLUCAP in the last 168 hours. Lipid Profile: No results for input(s): CHOL, HDL, LDLCALC, TRIG, CHOLHDL, LDLDIRECT in the last 72 hours. Thyroid Function Tests: Recent  Labs    06/12/18 0818  TSH 4.405  FREET4 0.70*   Anemia Panel: No results for input(s): VITAMINB12, FOLATE, FERRITIN, TIBC, IRON, RETICCTPCT in the last 72 hours. Sepsis Labs: Recent Labs  Lab 06/12/18 0818  PROCALCITON <0.10    Recent Results (from the past 240 hour(s))  MRSA PCR Screening     Status: None   Collection Time: 06/08/18  7:37 AM  Result Value Ref Range Status   MRSA by PCR NEGATIVE NEGATIVE Final    Comment:        The GeneXpert MRSA Assay (FDA approved for NASAL specimens only), is one component of a comprehensive MRSA colonization surveillance program. It is not intended to diagnose MRSA infection nor to guide or monitor treatment for MRSA infections. Performed at [facility], [address] [place], [place] [address]          Radiology Studies: No results found.      Scheduled Meds: . aspirin EC  81 mg Oral Daily   . donepezil  10 mg Oral QHS  . feeding supplement (ENSURE ENLIVE)  237 mL Oral BID BM  . ipratropium-albuterol  3 mL Nebulization TID  . mouth rinse  15 mL Mouth Rinse BID  . methylPREDNISolone (SOLU-MEDROL) injection  40 mg Intravenous Q12H  . metoprolol tartrate  12.5 mg Oral BID  . multivitamin with minerals  1 tablet Oral Q1200  . rosuvastatin  20 mg Oral QHS  . sodium chloride flush  3 mL Intravenous Q12H  . sodium chloride flush  3 mL Intravenous Q12H  . tamsulosin  0.4 mg Oral q morning - 10a   Continuous Infusions: . sodium chloride    . sodium chloride    . sodium chloride       LOS: 5 days    Time spent: 30 minutes   Author:  Berle Mull, MD [facility] Pager: [phone] 06/12/2018 6:29 PM     If 7PM-7AM, please contact night-coverage [email or web address] Password [facility] 06/12/2018, 6:26 PM

## 2018-06-12 NOTE — Progress Notes (Signed)
ANTICOAGULATION CONSULT NOTE - Follow Up Consult  Pharmacy Consult for heparin Indication: chest pain/ACS  Allergies  Allergen Reactions  . Neurontin [Gabapentin] Other (See Comments)    Caused confusion  . Imitrex [Sumatriptan] Nausea And Vomiting and Other (See Comments)    "made me like I was having a stoke"  . Lipitor [Atorvastatin] Nausea And Vomiting and Other (See Comments)    INTOLERANCE > MYALGIAS "couldn't get out of the bed ( pt had to crawl out of bed ) I hurt so bad"    Patient Measurements: Height: 5\' 7"  (170.2 cm) Weight: 122 lb 9.2 oz (55.6 kg) IBW/kg (Calculated) : 66.1 Heparin Dosing Weight: 55.6 kg  Vital Signs: Temp: 98.5 F (36.9 C) (12/24 0635) Temp Source: Oral (12/24 0635) BP: 116/67 (12/23 2338) Pulse Rate: 83 (12/23 2338)  Labs: Recent Labs    06/10/18 0453 06/11/18 0544 06/12/18 0259  HGB  --  14.6 15.3  HCT  --  42.7 43.5  PLT  --  128* 133*  LABPROT 25.7* 22.7* 20.1*  INR 2.38 2.03 1.73  CREATININE  --  0.91 0.91    Estimated Creatinine Clearance: 51.8 mL/min (by C-G formula based on SCr of 0.91 mg/dL).  Assessment: 77 y.o.malewith past medical history of CAD (s/p CABG in 1995, cath in 2008) on warfarin prior to admission for atrial fibrillation/ history of DVTs presenting with elevated serial troponins and likely NSTEMI. Pharmacy has been consulted to start heparin infusion. Per 06/01/18 note from [place], PTA warfarin dose is 5 mg daily (last dose 12/18).  INR has trended down from admission peak of 6.4 and now subtherapeutic at 1.73. Hgb stable and wnl at 15.3, platelets low stable at 133 this morning. No documented bleeding. Will initiate heparin infusion at ~12 units/kg/hr and follow up on plans for left and right heart cath later today.  Goal of Therapy:  Heparin level 0.3-0.7 units/ml Monitor platelets by anticoagulation protocol: Yes   Plan:  Start heparin infusion at 650 units/hr Check anti-Xa level in 8  hours and daily while on heparin  Daily CBC while on heparin, monitor for s/sx bleeding   Brendolyn Patty, PharmD PGY1 Pharmacy Resident Phone [phone]  06/12/2018   7:33 AM

## 2018-06-12 NOTE — Progress Notes (Signed)
PT Cancellation Note  Patient Details Name: [name] [name] MRN: [id number] DOB: [birth year]   Cancelled Treatment:    Reason Eval/Treat Not Completed: Patient at procedure or test/unavailable.  PT to check back as time allows.   Thanks,  Barbarann Ehlers. Marykathryn Carboni, PT, DPT  Acute Rehabilitation [phone] pager [phone]) [phone] office     Wells Guiles B Fahima Cifelli 06/12/2018, 10:57 AM

## 2018-06-12 NOTE — Plan of Care (Signed)
  Problem: Education: Goal: Knowledge of General Education information will improve Description: Including pain rating scale, medication(s)/side effects and non-pharmacologic comfort measures Outcome: Progressing   Problem: Clinical Measurements: Goal: Will remain free from infection Outcome: Progressing Goal: Diagnostic test results will improve Outcome: Progressing   

## 2018-06-12 NOTE — Progress Notes (Signed)
TR BAND REMOVAL  LOCATION:    Radial rt radial  DEFLATED PER PROTOCOL:   yes  TIME BAND OFF / DRESSING APPLIED:    1550/gauze and tegaderm  SITE UPON ARRIVAL:    Level 1, bruised  SITE AFTER BAND REMOVAL:    Level  1 CIRCULATION SENSATION AND MOVEMENT:    Within Normal Limits :  Yes, rt arm elevated on pillow  COMMENTS:

## 2018-06-12 NOTE — Progress Notes (Signed)
(  continuation of previous note) Rt groin fa and fv sheaths pulled at 1245. 1hr 30 minute manual hold d/t continued ooze. Bedrest started at 1415. Rt DP palpable. D5r. McAlhany aware of long hold and ordered to place pressure dresssing on rt groin site. Rt groin level 0

## 2018-06-13 DIAGNOSIS — R0902 Hypoxemia: Secondary | ICD-10-CM

## 2018-06-13 LAB — PROTIME-INR
INR: 1.62
Prothrombin Time: 19 seconds — ABNORMAL HIGH (ref 11.4–15.2)

## 2018-06-13 LAB — UREA NITROGEN, URINE: Urea Nitrogen, Ur: 1406 mg/dL

## 2018-06-13 LAB — BASIC METABOLIC PANEL
Anion gap: 11 (ref 5–15)
BUN: 28 mg/dL — ABNORMAL HIGH (ref 8–23)
CHLORIDE: 97 mmol/L — AB (ref 98–111)
CO2: 21 mmol/L — ABNORMAL LOW (ref 22–32)
Calcium: 8.4 mg/dL — ABNORMAL LOW (ref 8.9–10.3)
Creatinine, Ser: 0.77 mg/dL (ref 0.61–1.24)
GFR calc Af Amer: >60 mL/min (ref >60)
GFR calc non Af Amer: >60 mL/min (ref >60)
Glucose, Bld: 128 mg/dL — ABNORMAL HIGH (ref 70–99)
Potassium: 4.6 mmol/L (ref 3.5–5.1)
Sodium: 129 mmol/L — ABNORMAL LOW (ref 135–145)

## 2018-06-13 LAB — CBC
HCT: 41.4 % (ref 39.0–52.0)
Hemoglobin: 14.6 g/dL (ref 13.0–17.0)
MCH: 30.9 pg (ref 26.0–34.0)
MCHC: 35.3 g/dL (ref 30.0–36.0)
MCV: 87.7 fL (ref 80.0–100.0)
Platelets: 125 10*3/uL — ABNORMAL LOW (ref 150–400)
RBC: 4.72 MIL/uL (ref 4.22–5.81)
RDW: 15.2 % (ref 11.5–15.5)
WBC: 10.6 10*3/uL — ABNORMAL HIGH (ref 4.0–10.5)
nRBC: 0 % (ref 0.0–0.2)

## 2018-06-13 MED ORDER — METOPROLOL SUCCINATE ER 25 MG PO TB24
12.5000 mg | ORAL_TABLET | Freq: Every day | ORAL | Status: DC
  Administered 2018-06-14: 12.5 mg via ORAL
  Filled 2018-06-13 (×2): qty 1

## 2018-06-13 MED ORDER — METOPROLOL TARTRATE 12.5 MG HALF TABLET
12.5000 mg | ORAL_TABLET | Freq: Once | ORAL | Status: DC
  Filled 2018-06-13: qty 1

## 2018-06-13 MED ORDER — LISINOPRIL 2.5 MG PO TABS
2.5000 mg | ORAL_TABLET | Freq: Every day | ORAL | Status: DC
  Administered 2018-06-13 – 2018-06-14 (×2): 2.5 mg via ORAL
  Filled 2018-06-13 (×2): qty 1

## 2018-06-13 MED ORDER — WARFARIN SODIUM 5 MG PO TABS
5.0000 mg | ORAL_TABLET | Freq: Once | ORAL | Status: AC
  Administered 2018-06-13: 5 mg via ORAL
  Filled 2018-06-13: qty 1

## 2018-06-13 MED ORDER — WARFARIN - PHARMACIST DOSING INPATIENT
Freq: Every day | Status: DC
  Administered 2018-06-14: 20:00:00

## 2018-06-13 NOTE — Progress Notes (Signed)
[facility]                                                                              Patient Demographics  [name] [name], is a 77 y.o. male, DOB - 02-Aug-1940, TUU:[id number]  Admit date - 06/07/2018   Admitting Physician Phillips Grout, MD  Outpatient Primary MD for the patient is Luking, Grace Bushy, MD  Outpatient specialists:   LOS - 6  days   Medical records reviewed and are as summarized below:    Chief Complaint  Patient presents with  . Shortness of Breath       Brief summary   Patient is a 77 year old male with lung cancer, COPD, CAD, atrial fibrillation, peripheral vascular disease who saw his PCP on the day of admission for 1 week of progressive worsening shortness of breath and DOE.  No chest pain or fevers.  Patient reported reported coughing but no lower extremity edema.  Patient was supposed to follow-up with his oncologist in January to resume any treatment if needed.  He is completed 1 year of immunotherapy.  At his PCPs office, O2 sats were in 62s.  Patient was given treatment Solu-Medrol and O2 supplementation. In ED, found to be having troponin elevation, patient was placed on heparin drip.  He was transferred to [facility] for further intervention.  Assessment & Plan    Acute respiratory failure with hypoxia -Multifactorial likely due to NSTEMI, acute COPD exacerbation, CHF -Cardiology following, O2 sats still 95% on high flow 4 L -Home O2 evaluation, PT OT, will likely need home O2   NSTEMI with significant cardiomyopathy likely ischemic -Patient was placed on heparin drip, cardiology was consulted.  Underwent left heart cath which showed patent grafts, recommended medical management. -Per Dr. Harl Bowie, continue aspirin, started on Toprol-XL 12.5 mg daily, continue Crestor.  If BP tolerates, start low-dose lisinopril 2.5 mg daily  Atrial fibrillation Chronic, continue Toprol-XL Continue heparin for atrial fibrillation  COPD  exacerbation Mildly improving, continue IV steroids, doxycycline, scheduled nebs Home O2 evaluation  CAD status post CABG Continue aspirin, beta-blocker, statin therapy, ACE inhibitor if BP tolerates  Dementia Continue Aricept  Peripheral vascular disease status post AAA repair with diffuse PAD Follows vascular surgery outpatient  NSCLC Status post radiation chemotherapy and immunotherapy, repeat imaging scheduled on 06/2018 followed by Dr. Julien Nordmann as an outpatient.   Code Status: full  DVT Prophylaxis: Heparin drip Family Communication: Discussed in detail with the patient, all imaging results, lab results explained to the patient and wife at the bedside   Disposition Plan: PT evaluation pending, will decide home health PT versus skilled nursing facility rehab.  Home O2 evaluation  Time Spent in minutes   Procedures:    Consultants:   Cardiology  Antimicrobials:      Medications  Scheduled Meds: . aspirin EC  81 mg Oral Daily  . donepezil  10 mg Oral QHS  . feeding supplement (ENSURE ENLIVE)  237 mL Oral BID BM  . ipratropium-albuterol  3 mL Nebulization TID  . lisinopril  2.5 mg Oral Daily  . mouth rinse  15 mL Mouth Rinse BID  . methylPREDNISolone (  SOLU-MEDROL) injection  40 mg Intravenous Q12H  . [START ON 06/14/2018] metoprolol succinate  12.5 mg Oral Daily  . metoprolol tartrate  12.5 mg Oral Once  . multivitamin with minerals  1 tablet Oral Q1200  . rosuvastatin  20 mg Oral QHS  . sodium chloride flush  3 mL Intravenous Q12H  . sodium chloride flush  3 mL Intravenous Q12H  . tamsulosin  0.4 mg Oral q morning - 10a  . warfarin  5 mg Oral ONCE-1800  . Warfarin - Pharmacist Dosing Inpatient   Does not apply q1800   Continuous Infusions: . sodium chloride    . sodium chloride     PRN Meds:.sodium chloride, sodium chloride, acetaminophen, albuterol, ALPRAZolam, nitroGLYCERIN, sodium chloride flush, sodium chloride flush   Antibiotics    Anti-infectives (From admission, onward)   Start     Dose/Rate Route Frequency Ordered Stop   06/07/18 2300  doxycycline (VIBRA-TABS) tablet 100 mg     100 mg Oral Every 12 hours 06/07/18 2245 06/12/18 0240        Subjective:   Kris No was seen and examined today.  * Patient denies dizziness, chest pain, shortness of breath, abdominal pain, N/V/D/C.  Objective:   Vitals:   06/12/18 2348 06/13/18 0838 06/13/18 1010 06/13/18 1216  BP: 127/76 122/76  119/68  Pulse: 77 83 84   Resp: 18 12 18    Temp: (!) 97.5 F (36.4 C) 97.6 F (36.4 C)    TempSrc: Oral Oral    SpO2: 95% 96% 95%   Weight:      Height:        Intake/Output Summary (Last 24 hours) at 06/13/2018 1311 Last data filed at 06/13/2018 0600 Gross per 24 hour  Intake 750 ml  Output 1575 ml  Net -825 ml     Wt Readings from Last 3 Encounters:  06/12/18 55.6 kg  06/07/18 55.8 kg  05/29/18 56.5 kg     Exam  General: Alert and oriented x 3, NAD  Eyes  HEENT:    Cardiovascular: S1-S2 clear, RRR  Respiratory: Decreased breath sound at the bases  Gastrointestinal: Soft, nontender, nondistended, + bowel sounds  Ext: no pedal edema bilaterally  Neuro no neuro deficits  Musculoskeletal: No digital cyanosis, clubbing  Skin: No rashes  Psych: Normal affect and demeanor, alert and oriented x3    Data Reviewed:  I have personally reviewed following labs and imaging studies  Micro Results Recent Results (from the past 240 hour(s))  MRSA PCR Screening     Status: None   Collection Time: 06/08/18  7:37 AM  Result Value Ref Range Status   MRSA by PCR NEGATIVE NEGATIVE Final    Comment:        The GeneXpert MRSA Assay (FDA approved for NASAL specimens only), is one component of a comprehensive MRSA colonization surveillance program. It is not intended to diagnose MRSA infection nor to guide or monitor treatment for MRSA infections. Performed at [facility], [address]  [place], [place] [address]     Radiology Reports Dg Chest 2 View  Result Date: 06/07/2018 CLINICAL DATA:  Hypoxia.  Shortness of breath. EXAM: CHEST - 2 VIEW COMPARISON:  CT scan May 08, 2017 FINDINGS: Opacity in the right perihilar region is consistent with post therapeutic/radiation changes and is similar since November 2019. No pneumothorax. The cardiomediastinal silhouette is stable. No new nodules or masses. Mild scarring in the left base. No new suspicious infiltrate. IMPRESSION: Post therapeutic changes on the  right.  No acute interval changes. Electronically Signed   By: Dorise Bullion III M.D   On: 06/07/2018 17:28   Ct Angio Chest Pe W/cm &/or Wo Cm  Result Date: 06/07/2018 CLINICAL DATA:  Hypoxia. Shortness of breath for the past week. Right lung cancer. EXAM: CT ANGIOGRAPHY CHEST WITH CONTRAST TECHNIQUE: Multidetector CT imaging of the chest was performed using the standard protocol during bolus administration of intravenous contrast. Multiplanar CT image reconstructions and MIPs were obtained to evaluate the vascular anatomy. CONTRAST:  168mL ISOVUE-370 IOPAMIDOL (ISOVUE-370) INJECTION 76% COMPARISON:  CT chest dated May 08, 2018. FINDINGS: Cardiovascular: Satisfactory opacification of the pulmonary arteries to the segmental level. No evidence of pulmonary embolism. Stable cardiomegaly. No pericardial effusion. Prior CABG. No thoracic aortic aneurysm. Evaluation for aortic dissection is limited due to phase of contrast. Coronary, aortic arch, and branch vessel atherosclerotic vascular disease. 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 Mediastinum/Nodes: Stable right low jugular and paratracheal lymphadenopathy measuring up to 16 mm. Unchanged 9 mm subcarinal lymph node. Unchanged 10 mm left hilar lymph nodes. No axillary lymphadenopathy. The thyroid gland, trachea, and esophagus demonstrate no significant  findings. Lungs/Pleura: Moderate centrilobular emphysema again noted. Unchanged 5 mm pulmonary nodule in the right upper lobe. Previously seen 5 mm nodule in the inferior right upper lobe is no longer identified. Unchanged geographic consolidation and traction bronchiectasis centered about the medial and perihilar right lung. Unchanged chronic right pleural thickening. No pneumothorax, pleural effusion or new airspace consolidation. Upper Abdomen: No acute abnormality. Musculoskeletal: No chest wall abnormality. No acute or significant osseous findings. Chronic T6 and T8 mild compression deformities are unchanged. Review of the MIP images confirms the above findings. IMPRESSION: 1. No evidence of pulmonary embolism. No acute intrathoracic process. 2. Unchanged right perihilar radiation fibrosis. 3. Stable lower cervical and right paratracheal lymphadenopathy. 4. Previously seen new 5 mm pulmonary nodule in the right upper lobe is no longer identified and was likely inflammatory or infectious. 5. 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 6.  Emphysema (ICD10-J43.9). 7.  Aortic atherosclerosis (ICD10-I70.0). Electronically Signed   By: Titus Dubin M.D.   On: 06/07/2018 21:32    Lab Data:  CBC: Recent Labs  Lab 06/07/18 1737 06/08/18 0416 06/09/18 0512 06/11/18 0544 06/12/18 0259 06/13/18 0327  WBC 7.1 4.7 10.8* 9.3 10.2 10.6*  NEUTROABS 6.2 4.2  --   --   --   --   HGB 15.7 15.3 15.3 14.6 15.3 14.6  HCT 47.2 44.8 45.1 42.7 43.5 41.4  MCV 89.6 89.2 90.4 90.3 87.2 87.7  PLT 154 116* 129* 128* 133* 341*   Basic Metabolic Panel: Recent Labs  Lab 06/09/18 0512 06/11/18 0544 06/12/18 0259 06/12/18 0818 06/13/18 0327  NA 132* 130* 129* 130* 129*  K 4.5 4.5 4.5 4.7 4.6  CL 102 96* 95* 94* 97*  CO2 21* 23 24 26  21*  GLUCOSE 153* 131* 147* 124* 128*  BUN 26* 31* 32* 31* 28*  CREATININE 0.95 0.91 0.91 0.97 0.77    CALCIUM 9.3 9.1 9.0 9.0 8.4*  MG  --  1.9  --   --   --    GFR: Estimated Creatinine Clearance: 58.9 mL/min (by C-G formula based on SCr of 0.77 mg/dL). Liver Function Tests: Recent Labs  Lab 06/07/18 1737  AST 29  ALT 19  ALKPHOS 110  BILITOT 0.8  PROT 7.2  ALBUMIN 3.8   No results for input(s): LIPASE, AMYLASE in the last 168 hours. No results for input(s): AMMONIA in the last 168 hours. Coagulation Profile: Recent Labs  Lab 06/09/18 0512 06/10/18 0453 06/11/18 0544 06/12/18 0259 06/13/18 0804  INR 3.85 2.38 2.03 1.73 1.62   Cardiac Enzymes: Recent Labs  Lab 06/07/18 1737 06/07/18 2258 06/08/18 0416 06/08/18 1102  TROPONINI 0.72* 0.77* 0.60* 0.56*   BNP (last 3 results) No results for input(s): PROBNP in the last 8760 hours. HbA1C: No results for input(s): HGBA1C in the last 72 hours. CBG: No results for input(s): GLUCAP in the last 168 hours. Lipid Profile: No results for input(s): CHOL, HDL, LDLCALC, TRIG, CHOLHDL, LDLDIRECT in the last 72 hours. Thyroid Function Tests: Recent Labs    06/12/18 0818  TSH 4.405  FREET4 0.70*   Anemia Panel: No results for input(s): VITAMINB12, FOLATE, FERRITIN, TIBC, IRON, RETICCTPCT in the last 72 hours. Urine analysis:    Component Value Date/Time   COLORURINE AMBER (A) 02/08/2017 0955   APPEARANCEUR HAZY (A) 02/08/2017 0955   LABSPEC 1.023 02/08/2017 0955   PHURINE 5.0 02/08/2017 0955   GLUCOSEU NEGATIVE 02/08/2017 0955   HGBUR LARGE (A) 02/08/2017 0955   BILIRUBINUR NEGATIVE 02/08/2017 0955   KETONESUR NEGATIVE 02/08/2017 0955   PROTEINUR 100 (A) 02/08/2017 0955   UROBILINOGEN 1.0 07/17/2013 1201   NITRITE POSITIVE (A) 02/08/2017 0955   LEUKOCYTESUR NEGATIVE 02/08/2017 0955       M.D. [facility] 06/13/2018, 1:11 PM  Pager: [phone] Between 7am to 7pm - call Pager - [phone]  After 7pm go to [email or web address] - password [email or web address]  Call night coverage person covering after 7pm

## 2018-06-13 NOTE — Progress Notes (Signed)
ANTICOAGULATION CONSULT NOTE - Follow Up Consult  Pharmacy Consult for warfarin Indication: atrial fibrillation  Allergies  Allergen Reactions  . Neurontin [Gabapentin] Other (See Comments)    Caused confusion  . Imitrex [Sumatriptan] Nausea And Vomiting and Other (See Comments)    "made me like I was having a stoke"  . Lipitor [Atorvastatin] Nausea And Vomiting and Other (See Comments)    INTOLERANCE > MYALGIAS "couldn't get out of the bed ( pt had to crawl out of bed ) I hurt so bad"    Patient Measurements: Height: 5\' 7"  (170.2 cm) Weight: 122 lb 9.2 oz (55.6 kg) IBW/kg (Calculated) : 66.1 Heparin Dosing Weight: 55.6 kg  Vital Signs: Temp: 97.6 F (36.4 C) (12/25 0838) Temp Source: Oral (12/25 0838) BP: 122/76 (12/25 0838) Pulse Rate: 84 (12/25 1010)  Labs: Recent Labs    06/11/18 0544 06/12/18 0259 06/12/18 0818 06/13/18 0327 06/13/18 0804  HGB 14.6 15.3  --  14.6  --   HCT 42.7 43.5  --  41.4  --   PLT 128* 133*  --  125*  --   LABPROT 22.7* 20.1*  --   --  19.0*  INR 2.03 1.73  --   --  1.62  CREATININE 0.91 0.91 0.97 0.77  --     Estimated Creatinine Clearance: 58.9 mL/min (by C-G formula based on SCr of 0.77 mg/dL).  Assessment: 77 y.o.malewith past medical history of CAD (s/p CABG in 1995, cath in 2008) on warfarin prior to admission for atrial fibrillation/ history of DVTs presenting with elevated serial troponins and likely NSTEMI. Pharmacy has been consulted to restart warfarin. Per 06/01/18 note from [place], PTA warfarin dose is 5 mg daily (last dose 12/18).  INR has trended down from admission peak of 6.4 and now subtherapeutic at 1.62. Hgb/Hct stable, platlets low, but stable at 125 this morning. No documented bleeding.   Goal of Therapy:  INR 2-3 Monitor platelets by anticoagulation protocol: Yes   Plan:  Warfarin 5 mg PO x 1 tonight Daily INR, and CBC Monitor for s/sx bleeding  Thank you for allowing pharmacy to be a part  of this patient's care.  Leron Croak, PharmD PGY1 Pharmacy Resident Phone: [phone]  Please check AMION for all [place] phone numbers  06/13/2018   11:16 AM

## 2018-06-13 NOTE — Progress Notes (Signed)
Progress Note  Patient Name: [name] [name] Date of Encounter: 06/13/2018  Primary Cardiologist: Jenkins Rouge, MD   Subjective   Some SOB, no chest pain  Inpatient Medications    Scheduled Meds: . aspirin EC  81 mg Oral Daily  . donepezil  10 mg Oral QHS  . feeding supplement (ENSURE ENLIVE)  237 mL Oral BID BM  . ipratropium-albuterol  3 mL Nebulization TID  . mouth rinse  15 mL Mouth Rinse BID  . methylPREDNISolone (SOLU-MEDROL) injection  40 mg Intravenous Q12H  . metoprolol tartrate  12.5 mg Oral BID  . multivitamin with minerals  1 tablet Oral Q1200  . rosuvastatin  20 mg Oral QHS  . sodium chloride flush  3 mL Intravenous Q12H  . sodium chloride flush  3 mL Intravenous Q12H  . tamsulosin  0.4 mg Oral q morning - 10a   Continuous Infusions: . sodium chloride    . sodium chloride     PRN Meds: sodium chloride, sodium chloride, acetaminophen, albuterol, ALPRAZolam, nitroGLYCERIN, sodium chloride flush, sodium chloride flush   Vital Signs    Vitals:   06/12/18 2137 06/12/18 2348 06/13/18 0838 06/13/18 1010  BP: 103/68 127/76 122/76   Pulse: 66 77 83 84  Resp:  18 12 18   Temp:  (!) 97.5 F (36.4 C) 97.6 F (36.4 C)   TempSrc:  Oral Oral   SpO2:  95% 96% 95%  Weight:      Height:        Intake/Output Summary (Last 24 hours) at 06/13/2018 1048 Last data filed at 06/13/2018 0600 Gross per 24 hour  Intake 750 ml  Output 1575 ml  Net -825 ml   Filed Weights   06/10/18 0500 06/10/18 1709 06/12/18 0500  Weight: 56.3 kg 55.3 kg 55.6 kg    Telemetry    afib normal rates - Personally Reviewed  ECG    va  Physical Exam   GEN: No acute distress.   Neck: No JVD Cardiac: irreg  Respiratory: mild wheezing GI: Soft, nontender, non-distended  MS: No edema; No deformity. Neuro:  Nonfocal  Psych: Normal affect   Labs    Chemistry Recent Labs  Lab 06/07/18 1737  06/12/18 0259 06/12/18 0818 06/13/18 0327  NA 133*   < > 129* 130* 129*  K 4.4    < > 4.5 4.7 4.6  CL 103   < > 95* 94* 97*  CO2 19*   < > 24 26 21*  GLUCOSE 145*   < > 147* 124* 128*  BUN 18   < > 32* 31* 28*  CREATININE 1.09   < > 0.91 0.97 0.77  CALCIUM 9.0   < > 9.0 9.0 8.4*  PROT 7.2  --   --   --   --   ALBUMIN 3.8  --   --   --   --   AST 29  --   --   --   --   ALT 19  --   --   --   --   ALKPHOS 110  --   --   --   --   BILITOT 0.8  --   --   --   --   GFRNONAA >60   < > >60 >60 >60  GFRAA >60   < > >60 >60 >60  ANIONGAP 11   < > 10 10 11    < > = values in this interval not displayed.  Hematology Recent Labs  Lab 06/11/18 0544 06/12/18 0259 06/13/18 0327  WBC 9.3 10.2 10.6*  RBC 4.73 4.99 4.72  HGB 14.6 15.3 14.6  HCT 42.7 43.5 41.4  MCV 90.3 87.2 87.7  MCH 30.9 30.7 30.9  MCHC 34.2 35.2 35.3  RDW 15.4 15.2 15.2  PLT 128* 133* 125*    Cardiac Enzymes Recent Labs  Lab 06/07/18 1737 06/07/18 2258 06/08/18 0416 06/08/18 1102  TROPONINI 0.72* 0.77* 0.60* 0.56*   No results for input(s): TROPIPOC in the last 168 hours.   BNP Recent Labs  Lab 06/07/18 1737  BNP 1,166.0*     DDimer No results for input(s): DDIMER in the last 168 hours.   Radiology    No results found.  Cardiac Studies    Patient Profile     [name] [name] is a 77 y.o. male with past medical history of CAD (s/p CABG in 1995, cath in 2008 showing patent LIMA-LAD and SVG-PDA), AAA (s/p repair), PAD (s/p subclavian stenting and bilateral CEA with R CEA in 2005 and L CEA in 2015), HTN, HLD, dementia and NSCLC (s/p radiation, chemotherapy, and immunotherapy with most recent scan showing enlarging lymph nodes currently being followed) who is being seen today for the evaluation of elevated troponin values at the request of Dr. Manuella Ghazi.   Assessment & Plan    1. NSTEMI/Acute systolic HF - history of prior CABG - admitted with SOB and cough. Elevated trops to 0.77. Acute drop in LVEF to 35-40% compared to 12/2016 study - cath this admit occluded ostial LAD, LCX  30%, RCA 40% and mid RCA occluded. RIMA to mid LAD patent, SVG to RCA patent. LV EDP of 4 - overall patent grafts, no indication for intervention  - medical therapy with ASA, lopressor 12.5mg  bid, crestor 20. Soft bp's at times. Will change lopressor to Toprol 12.5mg  daily in setting of sysotlic dysfunciton (starting tomorrow since received AM lopressor dose). Start low dose lispinoril 2.5mg  daily, I don't think bp will tolerate entresto  2. Afib - resume coumadin - no indication for bridiging with heparin  3. COPD exacerbation - per primary team    For questions or updates, please contact [facility] Please consult [email or web address] for contact info under        Signed, Carlyle Dolly, MD  06/13/2018, 10:48 AM

## 2018-06-14 ENCOUNTER — Encounter (HOSPITAL_COMMUNITY): Payer: Self-pay | Admitting: Cardiovascular Disease

## 2018-06-14 DIAGNOSIS — I255 Ischemic cardiomyopathy: Secondary | ICD-10-CM

## 2018-06-14 LAB — CBC
HCT: 41.3 % (ref 39.0–52.0)
Hemoglobin: 14.7 g/dL (ref 13.0–17.0)
MCH: 30.8 pg (ref 26.0–34.0)
MCHC: 35.6 g/dL (ref 30.0–36.0)
MCV: 86.6 fL (ref 80.0–100.0)
Platelets: 122 10*3/uL — ABNORMAL LOW (ref 150–400)
RBC: 4.77 MIL/uL (ref 4.22–5.81)
RDW: 14.9 % (ref 11.5–15.5)
WBC: 12.9 10*3/uL — ABNORMAL HIGH (ref 4.0–10.5)
nRBC: 0 % (ref 0.0–0.2)

## 2018-06-14 LAB — POCT I-STAT 3, ART BLOOD GAS (G3+)
ACID-BASE DEFICIT: 2 mmol/L (ref 0.0–2.0)
Bicarbonate: 22.2 mmol/L (ref 20.0–28.0)
O2 Saturation: 95 %
TCO2: 23 mmol/L (ref 22–32)
pCO2 arterial: 35 mmHg (ref 32.0–48.0)
pH, Arterial: 7.411 (ref 7.350–7.450)
pO2, Arterial: 73 mmHg — ABNORMAL LOW (ref 83.0–108.0)

## 2018-06-14 LAB — BASIC METABOLIC PANEL
ANION GAP: 11 (ref 5–15)
BUN: 30 mg/dL — ABNORMAL HIGH (ref 8–23)
CO2: 21 mmol/L — ABNORMAL LOW (ref 22–32)
Calcium: 8.2 mg/dL — ABNORMAL LOW (ref 8.9–10.3)
Chloride: 96 mmol/L — ABNORMAL LOW (ref 98–111)
Creatinine, Ser: 0.94 mg/dL (ref 0.61–1.24)
GFR calc Af Amer: >60 mL/min (ref >60)
GFR calc non Af Amer: >60 mL/min (ref >60)
GLUCOSE: 129 mg/dL — AB (ref 70–99)
Potassium: 4.7 mmol/L (ref 3.5–5.1)
Sodium: 128 mmol/L — ABNORMAL LOW (ref 135–145)

## 2018-06-14 LAB — BRAIN NATRIURETIC PEPTIDE: B Natriuretic Peptide: 977.9 pg/mL — ABNORMAL HIGH (ref 0.0–100.0)

## 2018-06-14 LAB — POCT I-STAT 3, VENOUS BLOOD GAS (G3P V)
Bicarbonate: 25.1 mmol/L (ref 20.0–28.0)
O2 Saturation: 63 %
TCO2: 26 mmol/L (ref 22–32)
pCO2, Ven: 41.9 mmHg — ABNORMAL LOW (ref 44.0–60.0)
pH, Ven: 7.386 (ref 7.250–7.430)
pO2, Ven: 33 mmHg (ref 32.0–45.0)

## 2018-06-14 LAB — PROTIME-INR
INR: 1.52
Prothrombin Time: 18.2 seconds — ABNORMAL HIGH (ref 11.4–15.2)

## 2018-06-14 LAB — POCT ACTIVATED CLOTTING TIME: Activated Clotting Time: 103 seconds

## 2018-06-14 MED ORDER — MOMETASONE FURO-FORMOTEROL FUM 100-5 MCG/ACT IN AERO
2.0000 | INHALATION_SPRAY | Freq: Two times a day (BID) | RESPIRATORY_TRACT | Status: DC
  Administered 2018-06-14 – 2018-06-15 (×3): 2 via RESPIRATORY_TRACT
  Filled 2018-06-14: qty 8.8

## 2018-06-14 MED ORDER — PREDNISONE 20 MG PO TABS
40.0000 mg | ORAL_TABLET | Freq: Every day | ORAL | Status: DC
  Administered 2018-06-14 – 2018-06-15 (×2): 40 mg via ORAL
  Filled 2018-06-14 (×2): qty 2

## 2018-06-14 MED ORDER — PHENOL 1.4 % MT LIQD
1.0000 | OROMUCOSAL | Status: DC | PRN
  Administered 2018-06-14: 1 via OROMUCOSAL
  Filled 2018-06-14: qty 177

## 2018-06-14 MED ORDER — LISINOPRIL 2.5 MG PO TABS: 2.5000 mg | ORAL_TABLET | Freq: Every day | ORAL | 3 refills | Status: AC

## 2018-06-14 MED ORDER — PREDNISONE 20 MG PO TABS: 40.0000 mg | ORAL_TABLET | Freq: Every day | ORAL | 0 refills | Status: AC

## 2018-06-14 MED ORDER — ASPIRIN 81 MG PO TBEC: 81.0000 mg | DELAYED_RELEASE_TABLET | Freq: Every day | ORAL | 3 refills | Status: AC

## 2018-06-14 MED ORDER — WARFARIN SODIUM 7.5 MG PO TABS
7.5000 mg | ORAL_TABLET | Freq: Once | ORAL | Status: AC
  Administered 2018-06-14: 7.5 mg via ORAL
  Filled 2018-06-14: qty 1

## 2018-06-14 MED ORDER — ALBUTEROL SULFATE HFA 108 (90 BASE) MCG/ACT IN AERS: 2.0000 | INHALATION_SPRAY | Freq: Four times a day (QID) | RESPIRATORY_TRACT | 2 refills | Status: AC | PRN

## 2018-06-14 MED ORDER — LEVOTHYROXINE SODIUM 112 MCG PO TABS
112.0000 ug | ORAL_TABLET | Freq: Every day | ORAL | Status: DC
  Administered 2018-06-14 – 2018-06-15 (×2): 112 ug via ORAL
  Filled 2018-06-14 (×2): qty 1

## 2018-06-14 MED ORDER — ALUM & MAG HYDROXIDE-SIMETH 200-200-20 MG/5ML PO SUSP
30.0000 mL | ORAL | Status: DC | PRN
  Administered 2018-06-14: 30 mL via ORAL
  Filled 2018-06-14: qty 30

## 2018-06-14 MED ORDER — MOMETASONE FURO-FORMOTEROL FUM 100-5 MCG/ACT IN AERO: 2.0000 | INHALATION_SPRAY | Freq: Two times a day (BID) | RESPIRATORY_TRACT | 3 refills | Status: AC

## 2018-06-14 MED ORDER — METOPROLOL SUCCINATE ER 25 MG PO TB24: 12.5000 mg | ORAL_TABLET | Freq: Every day | ORAL | 3 refills | Status: AC

## 2018-06-14 MED ORDER — FUROSEMIDE 10 MG/ML IJ SOLN: 40.0000 mg | Freq: Every day | INTRAMUSCULAR | Status: DC

## 2018-06-14 MED ORDER — FUROSEMIDE 10 MG/ML IJ SOLN
40.0000 mg | Freq: Once | INTRAMUSCULAR | Status: AC
  Administered 2018-06-14: 40 mg via INTRAVENOUS
  Filled 2018-06-14: qty 4

## 2018-06-14 NOTE — Progress Notes (Signed)
Recommendations are for SNF. Pt and family refusing. They want to d/c home with [facility] services. CM provided choice. They use [facility] for their [facility] services per Wife. Jermaine with [facility] made aware and will follow. Pt to d/c home on home oxygen. CM provided choice on DME companies and they selected [facility]. Jermaine also aware of this and is following for set up on day of d/c.  CM following for further d/c needs.

## 2018-06-14 NOTE — Progress Notes (Signed)
ANTICOAGULATION CONSULT NOTE - Follow Up Consult  Pharmacy Consult for warfarin Indication: atrial fibrillation  Allergies  Allergen Reactions  . Neurontin [Gabapentin] Other (See Comments)    Caused confusion  . Imitrex [Sumatriptan] Nausea And Vomiting and Other (See Comments)    "made me like I was having a stoke"  . Lipitor [Atorvastatin] Nausea And Vomiting and Other (See Comments)    INTOLERANCE > MYALGIAS "couldn't get out of the bed ( pt had to crawl out of bed ) I hurt so bad"    Patient Measurements: Height: 5\' 7"  (170.2 cm) Weight: 122 lb 9.2 oz (55.6 kg) IBW/kg (Calculated) : 66.1 Heparin Dosing Weight: 55.6 kg  Vital Signs: Temp: 97.5 F (36.4 C) (12/26 0850) Temp Source: Oral (12/26 0850) BP: 103/58 (12/26 0850) Pulse Rate: 75 (12/26 0900)  Labs: Recent Labs    06/12/18 0259 06/12/18 0818 06/13/18 0327 06/13/18 0804 06/14/18 0258 06/14/18 0633  HGB 15.3  --  14.6  --  14.7  --   HCT 43.5  --  41.4  --  41.3  --   PLT 133*  --  125*  --  122*  --   LABPROT 20.1*  --   --  19.0*  --  18.2*  INR 1.73  --   --  1.62  --  1.52  CREATININE 0.91 0.97 0.77  --   --  0.94    Estimated Creatinine Clearance: 50.1 mL/min (by C-G formula based on SCr of 0.94 mg/dL).  Assessment: 77 y.o.malewith past medical history of CAD (s/p CABG in 1995, cath in 2008) on warfarin prior to admission for atrial fibrillation/ history of DVTs presenting with elevated serial troponins and likely NSTEMI. Pharmacy has been consulted to restart warfarin. Per 06/01/18 note from [place], PTA warfarin dose is 5 mg daily (last dose 12/18).  INR is subtherapeutic at 1.52. No bleeding noted, Hgb stable in 14s, platelets are low at 122.  Goal of Therapy:  INR 2-3 Monitor platelets by anticoagulation protocol: Yes   Plan:  Warfarin 7.5 mg PO x 1 tonight Daily INR, and CBC Monitor for s/sx bleeding  Thank you for allowing pharmacy to be a part of this patient's  care.  Renold Genta, PharmD, BCPS Clinical Pharmacist Clinical phone for 06/14/2018 until 3p is [phone] 06/14/2018 9:58 AM  **Pharmacist phone directory can now be found on [email or web address] listed under [place]**

## 2018-06-14 NOTE — Progress Notes (Signed)
Progress Note  Patient Name: [name] [name] Date of Encounter: 06/14/2018  Primary Cardiologist: Jenkins Rouge, MD   Subjective   77 year old gentleman with a history of coronary artery disease, status post CABG in 1995, peripheral arterial disease, hyperlipidemia, hypertension, dementia non-small cell lung cancer and COPD.  He was admitted with respiratory distress and hypoxemia.  He was found to have a minimally elevated troponin level.  Levels have been relatively flat and are likely due to demand ischemia. Heart catheterization performed several days ago reveals stable coronary artery disease without a culprit lesion. There is a patent RIMA to LAD graft.  There is tortuosity in the innominate artery and its possible that there is a structure to flow in the innominate artery.  Given his dementia, lung cancer and overall frail state, medical therapy was recommended.  Inpatient Medications    Scheduled Meds: . aspirin EC  81 mg Oral Daily  . donepezil  10 mg Oral QHS  . feeding supplement (ENSURE ENLIVE)  237 mL Oral BID BM  . ipratropium-albuterol  3 mL Nebulization TID  . levothyroxine  112 mcg Oral Q0600  . lisinopril  2.5 mg Oral Daily  . mouth rinse  15 mL Mouth Rinse BID  . metoprolol succinate  12.5 mg Oral Daily  . metoprolol tartrate  12.5 mg Oral Once  . mometasone-formoterol  2 puff Inhalation BID  . multivitamin with minerals  1 tablet Oral Q1200  . predniSONE  40 mg Oral Q breakfast  . rosuvastatin  20 mg Oral QHS  . sodium chloride flush  3 mL Intravenous Q12H  . sodium chloride flush  3 mL Intravenous Q12H  . tamsulosin  0.4 mg Oral q morning - 10a  . Warfarin - Pharmacist Dosing Inpatient   Does not apply q1800   Continuous Infusions: . sodium chloride    . sodium chloride     PRN Meds: sodium chloride, sodium chloride, acetaminophen, albuterol, ALPRAZolam, nitroGLYCERIN, sodium chloride flush, sodium chloride flush   Vital Signs    Vitals:   06/13/18 1534 06/13/18 1954 06/13/18 2333 06/14/18 0850  BP: (!) 108/58  (!) 131/120 (!) 103/58  Pulse: 79  84 73  Resp: 13  20 15   Temp: 97.6 F (36.4 C)  98.7 F (37.1 C) (!) 97.5 F (36.4 C)  TempSrc: Oral  Oral Oral  SpO2: 93% 93% 94% 91%  Weight:      Height:        Intake/Output Summary (Last 24 hours) at 06/14/2018 0853 Last data filed at 06/14/2018 0458 Gross per 24 hour  Intake 340 ml  Output 1250 ml  Net -910 ml   Filed Weights   06/10/18 0500 06/10/18 1709 06/12/18 0500  Weight: 56.3 kg 55.3 kg 55.6 kg    Telemetry     NSR  - Personally Reviewed  ECG     NSR  - Personally Reviewed  Physical Exam   GEN:  Chronically ill-appearing gentleman, elderly male no acute distress at present. Neck: No JVD Cardiac: RRR, no murmurs, rubs, or gallops.  Respiratory: Clear to auscultation bilaterally. GI: Soft, nontender, non-distended  MS: No edema; No deformity. Neuro:  Nonfocal  Psych: Normal affect   Labs    Chemistry Recent Labs  Lab 06/07/18 1737  06/12/18 0818 06/13/18 0327 06/14/18 0633  NA 133*   < > 130* 129* 128*  K 4.4   < > 4.7 4.6 4.7  CL 103   < > 94* 97* 96*  CO2  19*   < > 26 21* 21*  GLUCOSE 145*   < > 124* 128* 129*  BUN 18   < > 31* 28* 30*  CREATININE 1.09   < > 0.97 0.77 0.94  CALCIUM 9.0   < > 9.0 8.4* 8.2*  PROT 7.2  --   --   --   --   ALBUMIN 3.8  --   --   --   --   AST 29  --   --   --   --   ALT 19  --   --   --   --   ALKPHOS 110  --   --   --   --   BILITOT 0.8  --   --   --   --   GFRNONAA >60   < > >60 >60 >60  GFRAA >60   < > >60 >60 >60  ANIONGAP 11   < > 10 11 11    < > = values in this interval not displayed.     Hematology Recent Labs  Lab 06/12/18 0259 06/13/18 0327 06/14/18 0258  WBC 10.2 10.6* 12.9*  RBC 4.99 4.72 4.77  HGB 15.3 14.6 14.7  HCT 43.5 41.4 41.3  MCV 87.2 87.7 86.6  MCH 30.7 30.9 30.8  MCHC 35.2 35.3 35.6  RDW 15.2 15.2 14.9  PLT 133* 125* 122*    Cardiac Enzymes Recent Labs    Lab 06/07/18 1737 06/07/18 2258 06/08/18 0416 06/08/18 1102  TROPONINI 0.72* 0.77* 0.60* 0.56*   No results for input(s): TROPIPOC in the last 168 hours.   BNP Recent Labs  Lab 06/07/18 1737  BNP 1,166.0*     DDimer No results for input(s): DDIMER in the last 168 hours.   Radiology    No results found.  Cardiac Studies    Patient Profile      77 year old gentleman with a history of coronary artery disease COPD, lung cancer and dementia.  Is admitted with respiratory distress.  Assessment & Plan     1.  Troponin elevation: This is likely due to demand ischemia.  No culprit lesions were found at heart catheterization.  Continue current medications including metoprolol 12.5 mg a day, lisinopril 2.5 mg a day, nitroglycerin as needed  2.  Chronic systolic congestive heart failure: Ejection fraction is 35 to 40%.  Were not able to assess diastolic function. He has moderate pulmonary artery hypertension-likely from his COPD. He is not volume overloaded at this time.  He will need to limit his salt intake.  3.  Coronary artery disease: Patient is status post coronary artery bypass grafting.  Continue aspirin 81 mg a day.  Continue simvastatin 20 mg a day.  4.  Hyponatremia: This appears to be chronic.  Further management per the internal medicine team.  He should be able to go home soon. He appears stable from a cardiac standpoint      For questions or updates, please contact [place] Please consult [email or web address] for contact info under        Signed, Mertie Moores, MD  06/14/2018, 8:53 AM

## 2018-06-14 NOTE — Progress Notes (Signed)
Physical Therapy Treatment Patient Details Name: [name] [name] MRN: [id number] DOB: 05/15/1941 Today's Date: 06/14/2018    History of Present Illness Patient is a 77 year old male with lung cancer, COPD, CAD, atrial fibrillation, peripheral vascular disease who saw his PCP on the day of admission for 1 week of progressive worsening shortness of breath and DOE   PT Comments    Patient ambulating first time in several days with family present. Requires supervision, demonstrates decreased activity tolerance (see O2 note below). Walked 25' with RW no overt LOB or smyptoms. Family comfortable with care taking at this time, patient will benefit from HHPT to safely progress activity.   SATURATION QUALIFICATIONS: (This note is used to comply with regulatory documentation for home oxygen)  Patient Saturations on Room Air at Rest = 78%  Patient Saturations on Room Air while Ambulating = [name] due to de-sat at Rest%  Patient Saturations on 6 Liters of oxygen while Ambulating = 90%  Please briefly explain why patient needs home oxygen: patient de sats at rest on RA   HR 89 BP 91/57 (65)  Follow Up Recommendations  Home health PT;Supervision for mobility/OOB     Equipment Recommendations  None recommended by PT    Recommendations for Other Services       Precautions / Restrictions Precautions Precautions: Fall Restrictions Weight Bearing Restrictions: No    Mobility  Bed Mobility Overal bed mobility: Needs Assistance Bed Mobility: Supine to Sit     Supine to sit: Supervision     General bed mobility comments: slow labored movement  Transfers Overall transfer level: Needs assistance Equipment used: None Transfers: Sit to/from Stand;Stand Pivot Transfers Sit to Stand: Min assist Stand pivot transfers: Min assist       General transfer comment: unsteady labored movement  Ambulation/Gait Ambulation/Gait assistance: Min assist Gait Distance (Feet): 25 Feet Assistive  device: Rolling walker (2 wheeled) Gait Pattern/deviations: Step-to pattern Gait velocity: decreased   General Gait Details: pt with slow mild unsteady gait, desats to 85% on 6l with short distance ambulation    Stairs             Wheelchair Mobility    Modified Rankin (Stroke Patients Only)       Balance Overall balance assessment: Needs assistance Sitting-balance support: Feet supported;No upper extremity supported Sitting balance-Leahy Scale: Good     Standing balance support: During functional activity;No upper extremity supported Standing balance-Leahy Scale: Poor                              Cognition Arousal/Alertness: Awake/alert Behavior During Therapy: WFL for tasks assessed/performed Overall Cognitive Status: History of cognitive impairments - at baseline                                        Exercises      General Comments        Pertinent Vitals/Pain Pain Assessment: No/denies pain    Home Living                      Prior Function            PT Goals (current goals can now be found in the care plan section) Acute Rehab PT Goals Patient Stated Goal: return home PT Goal Formulation: With patient/family Time For Goal Achievement: 05/24/19 Potential to Achieve  Goals: Good Progress towards PT goals: Progressing toward goals    Frequency    Min 3X/week      PT Plan Discharge plan needs to be updated    Co-evaluation              AM-PAC PT "6 Clicks" Mobility   Outcome Measure  Help needed turning from your back to your side while in a flat bed without using bedrails?: None Help needed moving from lying on your back to sitting on the side of a flat bed without using bedrails?: A Little Help needed moving to and from a bed to a chair (including a wheelchair)?: A Little Help needed standing up from a chair using your arms (e.g., wheelchair or bedside chair)?: A Little Help needed to walk  in hospital room?: A Little Help needed climbing 3-5 steps with a railing? : A Lot 6 Click Score: 18    End of Session Equipment Utilized During Treatment: Oxygen;Gait belt Activity Tolerance: Patient limited by fatigue;Patient tolerated treatment well Patient left: in chair;with family/visitor present;with call bell/phone within reach Nurse Communication: Mobility status PT Visit Diagnosis: Unsteadiness on feet (R26.81);Other abnormalities of gait and mobility (R26.89);Muscle weakness (generalized) (M62.81)     Time: 7026-3785 PT Time Calculation (min) (ACUTE ONLY): 45 min  Charges:  $Gait Training: 8-22 mins $Therapeutic Activity: 8-22 mins                     Reinaldo Berber, PT, DPT Acute Rehabilitation Services Pager: [phone] Office: [phone]     Reinaldo Berber 06/14/2018, 9:44 AM

## 2018-06-14 NOTE — Evaluation (Signed)
Occupational Therapy Evaluation Patient Details Name: [name] [name] MRN: [id number] DOB: [birth year] Today's Date: 06/14/2018    History of Present Illness This 77 y.o. male admitted with 1 week progression of SOB and DOE.  Dx: Acute respiratory failure with hypoxia, NSTEMI with significant cardiomyopathy, acute on chronic diastolic CHF with RV dysfunction, COPD exacerbation, chronic A-Fib.  PMH includes NSCLC s/p radiation, chemo, and immunotherapy, dementia, PVD, s/p AAA repair with diffuse PAD, CAD s/p CABG    Clinical Impression   Pt admitted with above. He demonstrates the below listed deficits and will benefit from continued OT to maximize safety and independence with BADLs.  Pt presents to OT with generalized weakness, decreased activity tolerance, decreased balance, impaired cognition.  He currently requires min guard - min A for ADLs and min guard for short distance functional mobility.  02 sats at rest on 5: 85%.   With activity sats decreased to 81% on 6L supplemental 02, but rebounded to 94% within 1.5 mins.   Pt lives with wife and was fully independent PTA.  He will require 24 hour assistance at discharge.     Recommend HHOT and 24 hour supervision     Equipment Recommendations  None recommended by OT    Recommendations for Other Services       Precautions / Restrictions Precautions Precautions: Fall      Mobility Bed Mobility Overal bed mobility: Needs Assistance Bed Mobility: Supine to Sit     Supine to sit: Supervision        Transfers Overall transfer level: Needs assistance Equipment used: Rolling walker (2 wheeled) Transfers: Sit to/from [facility] Sit to Stand: Min guard Stand pivot transfers: Min guard       General transfer comment: close guarding for safety     Balance Overall balance assessment: Needs assistance Sitting-balance support: Feet supported;No upper extremity supported Sitting balance-Leahy Scale: Good      Standing balance support: During functional activity;No upper extremity supported Standing balance-Leahy Scale: Poor Standing balance comment: requires UE support                            ADL either performed or assessed with clinical judgement   ADL Overall ADL's : Needs assistance/impaired Eating/Feeding: Independent   Grooming: Wash/dry hands;Wash/dry face;Oral care;Brushing hair;Min guard;Standing   Upper Body Bathing: Supervision/ safety;Sitting   Lower Body Bathing: Min guard;Sit to/from stand   Upper Body Dressing : Supervision/safety;Set up;Sitting   Lower Body Dressing: Min guard;Sit to/from stand   Toilet Transfer: Min guard;Ambulation;Comfort height toilet;RW   Toileting- Water quality scientist and Hygiene: Min guard;Sit to/from stand       Functional mobility during ADLs: Min guard;Rolling walker General ADL Comments: Pt instructed to keep H20 tepid when showering, and began instruction re: pacing self, pursed lip breathing, and frequent rest breaks      Vision         Perception     Praxis      Pertinent Vitals/Pain Pain Assessment: No/denies pain     Hand Dominance Right   Extremity/Trunk Assessment Upper Extremity Assessment Upper Extremity Assessment: Generalized weakness   Lower Extremity Assessment Lower Extremity Assessment: Generalized weakness   Cervical / Trunk Assessment Cervical / Trunk Assessment: Normal   Communication Communication Communication: No difficulties   Cognition Arousal/Alertness: Awake/alert Behavior During Therapy: WFL for tasks assessed/performed Overall Cognitive Status: History of cognitive impairments - at baseline  General Comments  pt on 5L 02 upon entrance, sats 85%.  Sats improved to 95% - instructed pt in pursed lip breathing.  With activity, 02 sats decreased to 81% and 83*% on 6L supplemental 02, but rebounded to 94% after 1-1.5 mins of  pursed lip breathing     Exercises     Shoulder Instructions      Home Living Family/patient expects to be discharged to:: Private residence Living Arrangements: Spouse/significant other Available Help at Discharge: Family Type of Home: House Home Access: Ramped entrance     Home Layout: One level     Bathroom Shower/Tub: Corporate investment banker: Standard Bathroom Accessibility: Yes How Accessible: Accessible via wheelchair Home Equipment: [place] - 2 wheels;Bedside commode;Transport chair;Tub bench          Prior Functioning/Environment Level of Independence: Independent        Comments: per daughter, pt does not drive, but was fully independent at home         OT Problem List: Decreased strength;Decreased activity tolerance;Impaired balance (sitting and/or standing);Decreased cognition;Decreased safety awareness;Decreased knowledge of use of DME or AE;Cardiopulmonary status limiting activity      OT Treatment/Interventions: Self-care/ADL training;Therapeutic exercise;Energy conservation;DME and/or AE instruction;Therapeutic activities;Cognitive remediation/compensation;Patient/family education;Balance training    OT Goals(Current goals can be found in the care plan section) Acute Rehab OT Goals Patient Stated Goal: "to go home"  OT Goal Formulation: With patient/family Time For Goal Achievement: 06/28/18 Potential to Achieve Goals: Good ADL Goals Additional ADL Goal #1: Pt will participate in 8 mins ADL tasks with no more than 2 rest breaks and 02 sats no <88% Additional ADL Goal #2: Pt will incorporate energy conservation techniques into ADLs with min cues  OT Frequency: Min 2X/week   Barriers to D/C:            Co-evaluation              AM-PAC OT "6 Clicks" Daily Activity     Outcome Measure Help from another person eating meals?: None Help from another person taking care of personal grooming?: A Little Help from another  person toileting, which includes using toliet, bedpan, or urinal?: A Little Help from another person bathing (including washing, rinsing, drying)?: A Little Help from another person to put on and taking off regular upper body clothing?: A Little Help from another person to put on and taking off regular lower body clothing?: A Little 6 Click Score: 19   End of Session Equipment Utilized During Treatment: Rolling walker;Gait belt;Oxygen Nurse Communication: Mobility status  Activity Tolerance: Patient limited by fatigue Patient left: in bed;with call bell/phone within reach;with family/visitor present  OT Visit Diagnosis: Unsteadiness on feet (R26.81)                Time: 1353-1440 OT Time Calculation (min): 47 min Charges:  OT Evaluation $OT Eval Moderate Complexity: 1 Mod OT Treatments $Self Care/Home Management : 8-22 mins $Therapeutic Activity: 8-22 mins  Lucille Passy, OTR/L [place] Pager [phone] Office [phone]   Lucille Passy M 06/14/2018, 3:15 PM

## 2018-06-14 NOTE — Progress Notes (Addendum)
[facility]                                                                              Patient Demographics  [name] [name], is a 77 y.o. male, DOB - 03-27-1941, MBE:[id number]  Admit date - 06/07/2018   Admitting Physician Phillips Grout, MD  Outpatient Primary MD for the patient is Luking, Grace Bushy, MD  Outpatient specialists:   LOS - 7  days   Medical records reviewed and are as summarized below:    Chief Complaint  Patient presents with  . Shortness of Breath       Brief summary   Patient is a 77 year old male with lung cancer, COPD, CAD, atrial fibrillation, peripheral vascular disease who saw his PCP on the day of admission for 1 week of progressive worsening shortness of breath and DOE.  No chest pain or fevers.  Patient reported reported coughing but no lower extremity edema.  Patient was supposed to follow-up with his oncologist in January to resume any treatment if needed.  He is completed 1 year of immunotherapy.  At his PCPs office, O2 sats were in 56s.  Patient was given treatment Solu-Medrol and O2 supplementation. In ED, found to be having troponin elevation, patient was placed on heparin drip.  He was transferred to [facility] for further intervention.  Assessment & Plan    Acute respiratory failure with hypoxia -Multifactorial likely due to NSTEMI, acute COPD exacerbation, CHF -Cardiology following,  -Still hypoxic, O2 sats at rest is 78% and desaturated on ambulating, 90% on 6 L of O2 -Obtain BNP, patient is on Bumex as needed at home, will give 1 dose of IV Lasix 40 mg x 1 -2D echo 12/20 had shown EF of 35 to 40% with RV pressure overload, RV systolic function moderately reduced   NSTEMI with significant cardiomyopathy likely ischemic -Patient was placed on heparin drip, cardiology was consulted.  Underwent left heart cath which showed patent grafts, recommended medical management. -Per Dr. Harl Bowie, continue aspirin, started on Toprol-XL  12.5 mg daily, continue Crestor.  If BP tolerates, start low-dose lisinopril 2.5 mg daily  Acute on chronic diastolic CHF with RV dysfunction -2D echo on 06/08/2018 showed EF of 35 to 40% with right ventricular systolic function reduced, RV pressure overload -Still very hypoxic, on 6 L high flow.   - Negative balance of 6.5 L, patient takes Bumex as needed for edema, will give 1 dose of IV Lasix 40 mg x 1 and reassess, check BNP - will continue on low-dose Toprol-XL and low-dose lisinopril as BP allows -Holding lisinopril Addendum 2:40pm BNP elevated 977, will continue IV Lasix, hold Toprol-XL and lisinopril to allow diuresis  Chronic atrial fibrillation Rate controlled, continue Toprol-XL Continue Coumadin per pharmacy  COPD exacerbation Mildly improving, continue IV steroids, doxycycline, scheduled nebs Home O2 evaluation  CAD status post CABG Continue aspirin, beta-blocker, statin therapy, ACE inhibitor if BP tolerates  Dementia Continue Aricept  Peripheral vascular disease status post AAA repair with diffuse PAD Follows vascular surgery outpatient  NSCLC Status post radiation chemotherapy and immunotherapy, repeat imaging scheduled on 06/2018 followed by Dr. Julien Nordmann as an  outpatient.   Code Status: full  DVT Prophylaxis: Heparin drip Family Communication: Discussed in detail with the patient, all imaging results, lab results explained to the patient and wife at the bedside.  Per wife, patient will be better in home environment, maximize resources for home health.  PT evaluation done, has significant hypoxia   Disposition Plan:   Time Spent in minutes   Procedures:    Consultants:   Cardiology  Antimicrobials:      Medications  Scheduled Meds: . aspirin EC  81 mg Oral Daily  . donepezil  10 mg Oral QHS  . feeding supplement (ENSURE ENLIVE)  237 mL Oral BID BM  . furosemide  40 mg Intravenous Once  . ipratropium-albuterol  3 mL Nebulization TID  .  levothyroxine  112 mcg Oral Q0600  . mouth rinse  15 mL Mouth Rinse BID  . metoprolol succinate  12.5 mg Oral Daily  . metoprolol tartrate  12.5 mg Oral Once  . mometasone-formoterol  2 puff Inhalation BID  . multivitamin with minerals  1 tablet Oral Q1200  . predniSONE  40 mg Oral Q breakfast  . rosuvastatin  20 mg Oral QHS  . sodium chloride flush  3 mL Intravenous Q12H  . sodium chloride flush  3 mL Intravenous Q12H  . tamsulosin  0.4 mg Oral q morning - 10a  . warfarin  7.5 mg Oral ONCE-1800  . Warfarin - Pharmacist Dosing Inpatient   Does not apply q1800   Continuous Infusions: . sodium chloride    . sodium chloride     PRN Meds:.sodium chloride, sodium chloride, acetaminophen, albuterol, ALPRAZolam, nitroGLYCERIN, sodium chloride flush, sodium chloride flush   Antibiotics   Anti-infectives (From admission, onward)   Start     Dose/Rate Route Frequency Ordered Stop   06/07/18 2300  doxycycline (VIBRA-TABS) tablet 100 mg     100 mg Oral Every 12 hours 06/07/18 2245 06/12/18 1655        Subjective:   [name] [name] was seen and examined today.  Hypoxic on 6 L O2, no chest pain.  No fevers or chills, nausea vomiting.  Objective:   Vitals:   06/13/18 1954 06/13/18 2333 06/14/18 0850 06/14/18 0900  BP:  (!) 131/120 (!) 103/58   Pulse:  84 73 75  Resp:  20 15 16   Temp:  98.7 F (37.1 C) (!) 97.5 F (36.4 C)   TempSrc:  Oral Oral   SpO2: 93% 94% 91% 92%  Weight:      Height:        Intake/Output Summary (Last 24 hours) at 06/14/2018 1232 Last data filed at 06/14/2018 1230 Gross per 24 hour  Intake 340 ml  Output 1825 ml  Net -1485 ml     Wt Readings from Last 3 Encounters:  06/12/18 55.6 kg  06/07/18 55.8 kg  05/29/18 56.5 kg    Physical Exam  General: Alert and oriented x to self and person, NAD, has dementia  Eyes:   HEENT:  Atraumatic, normocephalic  Cardiovascular: S1-S2 clear, RRR  Respiratory: Decreased breath sounds  throughout  Gastrointestinal: Soft, nontender, nondistended, + bowel sounds  Ext: no pedal edema bilaterally  Neuro: No new deficits  Musculoskeletal: No digital cyanosis, clubbing  Skin: No rashes  Psych: Normal affect and demeanor, alert and oriented x3    Data Reviewed:  I have personally reviewed following labs and imaging studies  Micro Results Recent Results (from the past 240 hour(s))  MRSA PCR Screening  Status: None   Collection Time: 06/08/18  7:37 AM  Result Value Ref Range Status   MRSA by PCR NEGATIVE NEGATIVE Final    Comment:        The GeneXpert MRSA Assay (FDA approved for NASAL specimens only), is one component of a comprehensive MRSA colonization surveillance program. It is not intended to diagnose MRSA infection nor to guide or monitor treatment for MRSA infections. Performed at [facility], [address] [place], [place] [address]     Radiology Reports Dg Chest 2 View  Result Date: 06/07/2018 CLINICAL DATA:  Hypoxia.  Shortness of breath. EXAM: CHEST - 2 VIEW COMPARISON:  CT scan May 08, 2017 FINDINGS: Opacity in the right perihilar region is consistent with post therapeutic/radiation changes and is similar since November 2019. No pneumothorax. The cardiomediastinal silhouette is stable. No new nodules or masses. Mild scarring in the left base. No new suspicious infiltrate. IMPRESSION: Post therapeutic changes on the right.  No acute interval changes. Electronically Signed   By: Dorise Bullion III M.D   On: 06/07/2018 17:28   Ct Angio Chest Pe W/cm &/or Wo Cm  Result Date: 06/07/2018 CLINICAL DATA:  Hypoxia. Shortness of breath for the past week. Right lung cancer. EXAM: CT ANGIOGRAPHY CHEST WITH CONTRAST TECHNIQUE: Multidetector CT imaging of the chest was performed using the standard protocol during bolus administration of intravenous contrast. Multiplanar CT image reconstructions and MIPs were obtained to evaluate the vascular anatomy.  CONTRAST:  174mL ISOVUE-370 IOPAMIDOL (ISOVUE-370) INJECTION 76% COMPARISON:  CT chest dated May 08, 2018. FINDINGS: Cardiovascular: Satisfactory opacification of the pulmonary arteries to the segmental level. No evidence of pulmonary embolism. Stable cardiomegaly. No pericardial effusion. Prior CABG. No thoracic aortic aneurysm. Evaluation for aortic dissection is limited due to phase of contrast. Coronary, aortic arch, and branch vessel atherosclerotic vascular disease. 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 Mediastinum/Nodes: Stable right low jugular and paratracheal lymphadenopathy measuring up to 16 mm. Unchanged 9 mm subcarinal lymph node. Unchanged 10 mm left hilar lymph nodes. No axillary lymphadenopathy. The thyroid gland, trachea, and esophagus demonstrate no significant findings. Lungs/Pleura: Moderate centrilobular emphysema again noted. Unchanged 5 mm pulmonary nodule in the right upper lobe. Previously seen 5 mm nodule in the inferior right upper lobe is no longer identified. Unchanged geographic consolidation and traction bronchiectasis centered about the medial and perihilar right lung. Unchanged chronic right pleural thickening. No pneumothorax, pleural effusion or new airspace consolidation. Upper Abdomen: No acute abnormality. Musculoskeletal: No chest wall abnormality. No acute or significant osseous findings. Chronic T6 and T8 mild compression deformities are unchanged. Review of the MIP images confirms the above findings. IMPRESSION: 1. No evidence of pulmonary embolism. No acute intrathoracic process. 2. Unchanged right perihilar radiation fibrosis. 3. Stable lower cervical and right paratracheal lymphadenopathy. 4. Previously seen new 5 mm pulmonary nodule in the right upper lobe is no longer identified and was likely inflammatory or infectious. 5. 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 6.  Emphysema (ICD10-J43.9). 7.  Aortic atherosclerosis (ICD10-I70.0). Electronically Signed   By: Titus Dubin M.D.   On: 06/07/2018 21:32    Lab Data:  CBC: Recent Labs  Lab 06/07/18 1737 06/08/18 0416 06/09/18 0512 06/11/18 0544 06/12/18 0259 06/13/18 0327 06/14/18 0258  WBC 7.1 4.7 10.8* 9.3 10.2 10.6* 12.9*  NEUTROABS 6.2 4.2  --   --   --   --   --  HGB 15.7 15.3 15.3 14.6 15.3 14.6 14.7  HCT 47.2 44.8 45.1 42.7 43.5 41.4 41.3  MCV 89.6 89.2 90.4 90.3 87.2 87.7 86.6  PLT 154 116* 129* 128* 133* 125* 161*   Basic Metabolic Panel: Recent Labs  Lab 06/11/18 0544 06/12/18 0259 06/12/18 0818 06/13/18 0327 06/14/18 0633  NA 130* 129* 130* 129* 128*  K 4.5 4.5 4.7 4.6 4.7  CL 96* 95* 94* 97* 96*  CO2 23 24 26  21* 21*  GLUCOSE 131* 147* 124* 128* 129*  BUN 31* 32* 31* 28* 30*  CREATININE 0.91 0.91 0.97 0.77 0.94  CALCIUM 9.1 9.0 9.0 8.4* 8.2*  MG 1.9  --   --   --   --    GFR: Estimated Creatinine Clearance: 50.1 mL/min (by C-G formula based on SCr of 0.94 mg/dL). Liver Function Tests: Recent Labs  Lab 06/07/18 1737  AST 29  ALT 19  ALKPHOS 110  BILITOT 0.8  PROT 7.2  ALBUMIN 3.8   No results for input(s): LIPASE, AMYLASE in the last 168 hours. No results for input(s): AMMONIA in the last 168 hours. Coagulation Profile: Recent Labs  Lab 06/10/18 0453 06/11/18 0544 06/12/18 0259 06/13/18 0804 06/14/18 0633  INR 2.38 2.03 1.73 1.62 1.52   Cardiac Enzymes: Recent Labs  Lab 06/07/18 1737 06/07/18 2258 06/08/18 0416 06/08/18 1102  TROPONINI 0.72* 0.77* 0.60* 0.56*   BNP (last 3 results) No results for input(s): PROBNP in the last 8760 hours. HbA1C: No results for input(s): HGBA1C in the last 72 hours. CBG: No results for input(s): GLUCAP in the last 168 hours. Lipid Profile: No results for input(s): CHOL, HDL, LDLCALC, TRIG, CHOLHDL, LDLDIRECT in the last 72 hours. Thyroid  Function Tests: Recent Labs    06/12/18 0818  TSH 4.405  FREET4 0.70*   Anemia Panel: No results for input(s): VITAMINB12, FOLATE, FERRITIN, TIBC, IRON, RETICCTPCT in the last 72 hours. Urine analysis:    Component Value Date/Time   COLORURINE AMBER (A) 02/08/2017 0955   APPEARANCEUR HAZY (A) 02/08/2017 0955   LABSPEC 1.023 02/08/2017 0955   PHURINE 5.0 02/08/2017 0955   GLUCOSEU NEGATIVE 02/08/2017 0955   HGBUR LARGE (A) 02/08/2017 0955   BILIRUBINUR NEGATIVE 02/08/2017 0955   KETONESUR NEGATIVE 02/08/2017 0955   PROTEINUR 100 (A) 02/08/2017 0955   UROBILINOGEN 1.0 07/17/2013 1201   NITRITE POSITIVE (A) 02/08/2017 0955   LEUKOCYTESUR NEGATIVE 02/08/2017 0955     Ripudeep Rai M.D. [facility] 06/14/2018, 12:32 PM  Pager: [phone] Between 7am to 7pm - call Pager - [phone]  After 7pm go to [email or web address] - password [email or web address]  Call night coverage person covering after 7pm

## 2018-06-15 ENCOUNTER — Telehealth: Payer: Self-pay | Admitting: *Deleted

## 2018-06-15 LAB — PROTIME-INR
INR: 1.6
Prothrombin Time: 18.9 seconds — ABNORMAL HIGH (ref 11.4–15.2)

## 2018-06-15 LAB — BASIC METABOLIC PANEL
Anion gap: 10 (ref 5–15)
BUN: 34 mg/dL — ABNORMAL HIGH (ref 8–23)
CO2: 25 mmol/L (ref 22–32)
CREATININE: 0.97 mg/dL (ref 0.61–1.24)
Calcium: 8.6 mg/dL — ABNORMAL LOW (ref 8.9–10.3)
Chloride: 93 mmol/L — ABNORMAL LOW (ref 98–111)
GFR calc Af Amer: >60 mL/min (ref >60)
GFR calc non Af Amer: >60 mL/min (ref >60)
Glucose, Bld: 101 mg/dL — ABNORMAL HIGH (ref 70–99)
Potassium: 4.2 mmol/L (ref 3.5–5.1)
Sodium: 128 mmol/L — ABNORMAL LOW (ref 135–145)

## 2018-06-15 LAB — CBC
HEMATOCRIT: 39.1 % (ref 39.0–52.0)
Hemoglobin: 13.7 g/dL (ref 13.0–17.0)
MCH: 30.2 pg (ref 26.0–34.0)
MCHC: 35 g/dL (ref 30.0–36.0)
MCV: 86.3 fL (ref 80.0–100.0)
Platelets: 131 10*3/uL — ABNORMAL LOW (ref 150–400)
RBC: 4.53 MIL/uL (ref 4.22–5.81)
RDW: 15.2 % (ref 11.5–15.5)
WBC: 12.7 10*3/uL — ABNORMAL HIGH (ref 4.0–10.5)
nRBC: 0 % (ref 0.0–0.2)

## 2018-06-15 MED ORDER — NYSTATIN 100000 UNIT/ML MT SUSP: 5.0000 mL | Freq: Four times a day (QID) | OROMUCOSAL | 2 refills | Status: AC

## 2018-06-15 MED ORDER — NYSTATIN 100000 UNIT/ML MT SUSP
5.0000 mL | Freq: Four times a day (QID) | OROMUCOSAL | Status: DC
  Administered 2018-06-15: 500000 [IU] via ORAL
  Filled 2018-06-15: qty 5

## 2018-06-15 MED ORDER — BUMETANIDE 1 MG PO TABS: 1.0000 mg | ORAL_TABLET | Freq: Every day | ORAL | 1 refills | Status: AC

## 2018-06-15 MED ORDER — FUROSEMIDE 10 MG/ML IJ SOLN
40.0000 mg | Freq: Every day | INTRAMUSCULAR | Status: DC
  Administered 2018-06-15: 40 mg via INTRAVENOUS
  Filled 2018-06-15: qty 4

## 2018-06-15 MED ORDER — WARFARIN SODIUM 7.5 MG PO TABS: 7.5000 mg | ORAL_TABLET | Freq: Once | ORAL | Status: DC

## 2018-06-15 NOTE — Progress Notes (Signed)
Pt will require 5L of 02 to be [place] home. Pt sats at 88% on 5L 02 while resting. Pt not ambulatory at this moment. Will be DC'd with home health.  Riley Kill RN

## 2018-06-15 NOTE — Progress Notes (Signed)
Occupational Therapy Treatment Patient Details Name: NASIER THUMM MRN: [id number] DOB: 08/19/1940 Today's Date: 06/15/2018    History of present illness This 77 y.o. male admitted with 1 week progression of SOB and DOE.  Dx: Acute respiratory failure with hypoxia, NSTEMI with significant cardiomyopathy, acute on chronic diastolic CHF with RV dysfunction, COPD exacerbation, chronic A-Fib.  PMH includes NSCLC s/p radiation, chemo, and immunotherapy, dementia, PVD, s/p AAA repair with diffuse PAD, CAD s/p CABG    OT comments  Pt limited to very small increments of activity (I.e. ambulating 20-25 ft, donning/doffing socks, brushing teeth at sink) due to 02 sats dropping to 81-83% on 6L supplemental 02.  O2 saturation rebounds to low 90s within 1 min with seated rest break and pursed lip breathing. Wife present and reports their home is ramped and pt has a w/c that fits in all rooms except bathroom.  We discussed pt only participating in activity for short periods of time (2-4 mins) before sitting to take a rest break, and to use w/c to assist with transfers and mobility in the house.  She verbalizes understanding. Pt continues to complain of tongue burning.   Follow Up Recommendations  Supervision/Assistance - 24 hour;Home health OT    Equipment Recommendations  None recommended by OT    Recommendations for Other Services      Precautions / Restrictions Precautions Precautions: Fall Restrictions Weight Bearing Restrictions: No       Mobility Bed Mobility Overal bed mobility: Needs Assistance Bed Mobility: Supine to Sit;Sit to Supine     Supine to sit: Supervision Sit to supine: Supervision      Transfers Overall transfer level: Needs assistance Equipment used: Rolling walker (2 wheeled) Transfers: Sit to/from [facility] Sit to Stand: Min guard Stand pivot transfers: Min guard       General transfer comment: min guard for safety and cues for hand  placement     Balance Overall balance assessment: Needs assistance Sitting-balance support: Feet supported;No upper extremity supported Sitting balance-Leahy Scale: Good     Standing balance support: During functional activity;No upper extremity supported Standing balance-Leahy Scale: Poor Standing balance comment: requires UE support                            ADL either performed or assessed with clinical judgement   ADL Overall ADL's : Needs assistance/impaired     Grooming: Wash/dry hands;Wash/dry face;Oral care;Brushing hair;Min guard;Standing;Sitting       Lower Body Bathing: Min guard;Sit to/from stand       Lower Body Dressing: Min guard;Sit to/from stand   Toilet Transfer: Min guard;Ambulation;RW;Grab bars;Comfort height toilet;BSC   Toileting- Clothing Manipulation and Hygiene: Min guard;Sit to/from stand       Functional mobility during ADLs: Min guard;Rolling walker General ADL Comments: Pt is able to complete ADL tasks in small increments of activity as sats decrease to 81-83% on 6L, but rebounds to 92% after ~ 1 min of pursed lip breathing      Vision       Perception     Praxis      Cognition Arousal/Alertness: Awake/alert Behavior During Therapy: WFL for tasks assessed/performed Overall Cognitive Status: History of cognitive impairments - at baseline  Exercises     Shoulder Instructions       General Comments wife present.  She reports they have a w/c at home, and ramped entrance.   She indicates w/c fits through all rooms in house except bathroom.  Instructed her to utilize w/c at home and keep activity to small controlled amounts with frequent seated rest breaks.  She verbalized understanding     Pertinent Vitals/ Pain       Pain Assessment: Faces Faces Pain Scale: Hurts little more Pain Location: tongue  Pain Descriptors / Indicators: Burning Pain Intervention(s):  Monitored during session  Home Living                                          Prior Functioning/Environment              Frequency  Min 2X/week        Progress Toward Goals  OT Goals(current goals can now be found in the care plan section)  Progress towards OT goals: Progressing toward goals     Plan Discharge plan remains appropriate    Co-evaluation                 AM-PAC OT "6 Clicks" Daily Activity     Outcome Measure   Help from another person eating meals?: None Help from another person taking care of personal grooming?: A Little Help from another person toileting, which includes using toliet, bedpan, or urinal?: A Little Help from another person bathing (including washing, rinsing, drying)?: A Little Help from another person to put on and taking off regular upper body clothing?: A Little Help from another person to put on and taking off regular lower body clothing?: A Little 6 Click Score: 19    End of Session Equipment Utilized During Treatment: Rolling walker;Oxygen  OT Visit Diagnosis: Unsteadiness on feet (R26.81)   Activity Tolerance Patient limited by fatigue   Patient Left in bed;with call bell/phone within reach;with family/visitor present   Nurse Communication          Time: 5916-3846 OT Time Calculation (min): 33 min  Charges: OT General Charges $OT Visit: 1 Visit OT Treatments $Self Care/Home Management : 8-22 mins $Therapeutic Activity: 8-22 mins  Lucille Passy, OTR/L Acute Rehabilitation Services Pager [phone] Office [phone]    Lucille Passy M 06/15/2018, 12:00 PM

## 2018-06-15 NOTE — Progress Notes (Signed)
Nutrition Follow Up  DOCUMENTATION CODES:   Severe malnutrition in context of acute illness/injury  INTERVENTION:    Ensure Enlive po BID, each supplement provides 350 kcal and 20 grams of protein  NUTRITION DIAGNOSIS:   Severe Malnutrition(Acute) related to acute illness(thrush, gastroenteritis, Nstemi in 1 month) as evidenced by loss of >5% bw in 1 month and moderate muscle/fat wasting, ongoing  GOAL:   Patient will meet greater than or equal to 90% of their needs, met  MONITOR:   PO intake, Supplement acceptance, Labs, Weight trends, Diet advancement  ASSESSMENT:  77 y/o male PMHx COPD, CAD s/p cabg, Afib, PVD, MI, CKD3, dementia and stage III lung cancer s/p chemoradiation-just completing a year course of oral immunotherapy. Currently only receiving observation. Presented to ED from PCP office d/t hypoxia. Reports associated nausea, anorexia and weight loss. Workup showed elevated Troponin.  Admitted for acute resp failure 2/2 copd exacerbation and possible nstemi.    Pt continues on a Heart Healthy diet. PO intake excellent at 100% per flowsheets. Drinking his Ensure Enlive supplements. Labs & medications reviewed.  Diet Order:   Diet Order            Diet Heart Room service appropriate? Yes; Fluid consistency: Thin; Fluid restriction: 1500 mL Fluid  Diet effective now             EDUCATION NEEDS:   Education needs have been addressed  Skin:  Skin Assessment: Reviewed RN Assessment  Last BM:  12/23  Height:   Ht Readings from Last 1 Encounters:  06/10/18 '5\' 7"'  (1.702 m)   Weight:   Wt Readings from Last 1 Encounters:  06/15/18 56 kg   Wt Readings from Last 10 Encounters:  06/15/18 56 kg  06/07/18 55.8 kg  05/29/18 56.5 kg  05/10/18 58.4 kg  04/03/18 59 kg  03/30/18 58.5 kg  03/20/18 58.1 kg  03/16/18 58.1 kg  02/09/18 58.5 kg  02/08/18 60.1 kg   Ideal Body Weight:  67.27 kg  BMI:  Body mass index is 19.34 kg/m.  Estimated Nutritional  Needs:   Kcal:  2000-2200   Protein:  85-100 gm   Fluid:  2.0-2.2 L  Arthur Holms, RD, LDN Pager #: [phone] After-Hours Pager #: [phone]

## 2018-06-15 NOTE — Progress Notes (Signed)
Progress Note  Patient Name: [name] [name] Date of Encounter: 06/15/2018  Primary Cardiologist: Jenkins Rouge, MD    Subjective   77 year old gentleman with a history of coronary artery disease, status post CABG in 1995.  He has peripheral vascular disease, atrial fib ,  hyperlipidemia, hypertension, dementia, non-small cell lung cancer, and COPD.  Mated with respiratory distress and hypoxemia.  He was found to have a minimally elevated troponin level.  Troponin levels have been relatively flat and are likely due to demand ischemia.  Catheterization on June 12, 2018 reveals labile coronary artery disease without a culprit lesion.  This morning his oxygen saturation is 95%. He has diuresed net 7.7 L so far during this admission.     Inpatient Medications    Scheduled Meds: . aspirin EC  81 mg Oral Daily  . donepezil  10 mg Oral QHS  . feeding supplement (ENSURE ENLIVE)  237 mL Oral BID BM  . furosemide  40 mg Intravenous Daily  . ipratropium-albuterol  3 mL Nebulization TID  . levothyroxine  112 mcg Oral Q0600  . mouth rinse  15 mL Mouth Rinse BID  . mometasone-formoterol  2 puff Inhalation BID  . multivitamin with minerals  1 tablet Oral Q1200  . nystatin  5 mL Oral QID  . predniSONE  40 mg Oral Q breakfast  . rosuvastatin  20 mg Oral QHS  . tamsulosin  0.4 mg Oral q morning - 10a  . Warfarin - Pharmacist Dosing Inpatient   Does not apply q1800   Continuous Infusions: . sodium chloride     PRN Meds: sodium chloride, acetaminophen, albuterol, ALPRAZolam, alum & mag hydroxide-simeth, nitroGLYCERIN, phenol   Vital Signs    Vitals:   06/14/18 1916 06/14/18 2321 06/15/18 0700 06/15/18 0746  BP:  103/61  112/60  Pulse:  78  63  Resp:  15  14  Temp:  98 F (36.7 C)  (!) 97.5 F (36.4 C)  TempSrc:  Oral  Oral  SpO2: 94% 95%  95%  Weight:   56 kg   Height:        Intake/Output Summary (Last 24 hours) at 06/15/2018 0832 Last data filed at 06/15/2018  0800 Gross per 24 hour  Intake 0 ml  Output 1375 ml  Net -1375 ml   Filed Weights   06/10/18 1709 06/12/18 0500 06/15/18 0700  Weight: 55.3 kg 55.6 kg 56 kg    Telemetry    NSR - Personally Reviewed  ECG       Physical Exam   GEN:  Chronically ill-appearing gentleman, hypoxic this morning on 4 L O2 Neck: No JVD Cardiac:  Rate S1-S2. Respiratory:  Breath sounds bilaterally.  No rales. GI: Soft, nontender, non-distended  MS:  New skin turgor in his hands.  No edema in his legs. Neuro:  Nonfocal  Psych: Normal affect   Labs    Chemistry Recent Labs  Lab 06/12/18 0818 06/13/18 0327 06/14/18 0633  NA 130* 129* 128*  K 4.7 4.6 4.7  CL 94* 97* 96*  CO2 26 21* 21*  GLUCOSE 124* 128* 129*  BUN 31* 28* 30*  CREATININE 0.97 0.77 0.94  CALCIUM 9.0 8.4* 8.2*  GFRNONAA >60 >60 >60  GFRAA >60 >60 >60  ANIONGAP 10 11 11      Hematology Recent Labs  Lab 06/13/18 0327 06/14/18 0258 06/15/18 0223  WBC 10.6* 12.9* 12.7*  RBC 4.72 4.77 4.53  HGB 14.6 14.7 13.7  HCT 41.4 41.3 39.1  MCV 87.7 86.6 86.3  MCH 30.9 30.8 30.2  MCHC 35.3 35.6 35.0  RDW 15.2 14.9 15.2  PLT 125* 122* 131*    Cardiac Enzymes Recent Labs  Lab 06/08/18 1102  TROPONINI 0.56*   No results for input(s): TROPIPOC in the last 168 hours.   BNP Recent Labs  Lab 06/14/18 1231  BNP 977.9*     DDimer No results for input(s): DDIMER in the last 168 hours.   Radiology    No results found.  Cardiac Studies     Patient Profile     77 y.o. male with paroxysmal atrial fibrillation, coronary artery disease, severe COPD who presented with respiratory arrest and hypoxemia.  Assessment & Plan    1.  Respiratory distress.  The patient is diuresed 7.7 L.  He has reduced skin turgor in his hands.    He appears to be dry at this point .  I ordered a basic metabolic profile today for assessment of his BUN and creatinine. He had normal LV filling pressures at the time of his heart  catheterization ( LVEDP = 4)   I think he is adequately diuresed.   He is still requiring 6 liters of [place] O2.   ( O2 sat on 4 liters / min is 86%)    2.  Troponin elevation -  Likely due to demand ischemia.   Cath showed stable CAD .  3.   COPD - severe.  Requiring high [place] O2  Further plans per IM .    [facility] will sign off.   Medication Recommendations:  Continue to treat COPD. Would DC IV lasix and resume home bumex tomorrow Continue coumadin per pharmacy recs  Other recommendations (labs, testing, etc):   Follow up as an outpatient:   With Dr. Johnsie Cancel or APP   For questions or updates, please contact [facility] Please consult [email or web address] for contact info under        Signed, Mertie Moores, MD  06/15/2018, 8:32 AM

## 2018-06-15 NOTE — Care Management Important Message (Signed)
Important Message  Patient Details  Name: [name] [name] MRN: [id number] Date of Birth: 08/14/1940   Medicare Important Message Given:  Yes    Barb Merino [place] 06/15/2018, 4:41 PM

## 2018-06-15 NOTE — Plan of Care (Signed)
  Problem: Education: Goal: Knowledge of General Education information will improve Description Including pain rating scale, medication(s)/side effects and non-pharmacologic comfort measures Outcome: Not Progressing   Problem: Health Behavior/Discharge Planning: Goal: Ability to manage health-related needs will improve Outcome: Not Progressing   Problem: Clinical Measurements: Goal: Respiratory complications will improve Outcome: Progressing   Problem: Safety: Goal: Ability to remain free from injury will improve Outcome: Progressing

## 2018-06-15 NOTE — Telephone Encounter (Signed)
Wife states they just home from hospital and she was transferred to the front to schedule hospital follow up mid next week. Does not need any thing at this time.

## 2018-06-15 NOTE — Discharge Summary (Signed)
Physician Discharge Summary   Patient ID: [name] [name] MRN: [id number] DOB/AGE: Feb 15, 1941 77 y.o.  Admit date: 06/07/2018 Discharge date: 06/15/2018  Primary Care Physician:  [name], MD   Recommendations for Outpatient Follow-up:  1. Follow up with PCP in 1-2 weeks 2. Please obtain BMP in one week  3. Patient qualifies for home O2, 5 L at rest and 6 L on ambulation due to acute respiratory failure with hypoxia secondary to severe COPD, history of NSCLCa, CHF  Home Health: Home health PT OT.  Patient and wife refuses skilled nursing facility Equipment/Devices: Case management to arrange home health PT OT aide RN  Discharge Condition: stable  CODE STATUS: FULL  Diet recommendation: Heart healthy diet, low-sodium   Discharge Diagnoses:    . Acute respiratory failure with hypoxia ([place]) . COPD with acute exacerbation (HCC) NSTEMI with significant cardiomyopathy likely ischemic Acute on chronic diastolic CHF with RV dysfunction Chronic atrial fibrillation CAD status post CABG Dementia Peripheral vascular disease status post AAA . Essential hypertension . T1b N2 M0 disease (stage IIIA).adenocarcinoma of the right middle lobe of lung (HCC) Chronic hyponatremia  Consults: Cardiology    Allergies:   Allergies  Allergen Reactions  . Neurontin [Gabapentin] Other (See Comments)    Caused confusion  . Imitrex [Sumatriptan] Nausea And Vomiting and Other (See Comments)    "made me like I was having a stoke"  . Lipitor [Atorvastatin] Nausea And Vomiting and Other (See Comments)    INTOLERANCE > MYALGIAS "couldn't get out of the bed ( pt had to crawl out of bed ) I hurt so bad"     DISCHARGE MEDICATIONS: Allergies as of 06/15/2018      Reactions   Neurontin [gabapentin] Other (See Comments)   Caused confusion   Imitrex [sumatriptan] Nausea And Vomiting, Other (See Comments)   "made me like I was having a stoke"   Lipitor [atorvastatin] Nausea And Vomiting,  Other (See Comments)   INTOLERANCE > MYALGIAS "couldn't get out of the bed ( pt had to crawl out of bed ) I hurt so bad"      Medication List    STOP taking these medications   magic mouthwash w/lidocaine Soln   methylPREDNISolone 4 MG Tbpk tablet Commonly known as:  MEDROL DOSEPAK   metoprolol tartrate 25 MG tablet Commonly known as:  LOPRESSOR   potassium chloride SA 20 MEQ tablet Commonly known as:  K-DUR,KLOR-CON     TAKE these medications   acetaminophen 325 MG tablet Commonly known as:  TYLENOL Take 325-650 mg by mouth daily as needed for headache (for pain).   albuterol 108 (90 Base) MCG/ACT inhaler Commonly known as:  PROVENTIL HFA;VENTOLIN HFA Inhale 2 puffs into the lungs every 6 (six) hours as needed for wheezing or shortness of breath.   aspirin 81 MG EC tablet Take 1 tablet (81 mg total) by mouth daily.   bumetanide 1 MG tablet Commonly known as:  BUMEX Take 1 tablet (1 mg total) by mouth daily. What changed:    when to take this  reasons to take this   donepezil 10 MG tablet Commonly known as:  ARICEPT Take 1 tablet daily What changed:    how much to take  how to take this  when to take this  additional instructions   lansoprazole 15 MG capsule Commonly known as:  PREVACID Take 1 capsule (15 mg total) by mouth daily. What changed:  when to take this   levothyroxine 112 MCG tablet  Commonly known as:  SYNTHROID, LEVOTHROID Take 1 tablet (112 mcg total) by mouth daily. What changed:  when to take this   linaclotide 145 MCG Caps capsule Commonly known as:  LINZESS Take 1 capsule (145 mcg total) by mouth daily before breakfast. What changed:    when to take this  reasons to take this   lisinopril 2.5 MG tablet Commonly known as:  PRINIVIL,ZESTRIL Take 1 tablet (2.5 mg total) by mouth daily.   metoprolol succinate 25 MG 24 hr tablet Commonly known as:  TOPROL-XL Take 0.5 tablets (12.5 mg total) by mouth daily.    mometasone-formoterol 100-5 MCG/ACT Aero Commonly known as:  DULERA Inhale 2 puffs into the lungs 2 (two) times daily.   niacin 500 MG tablet Commonly known as:  SLO-NIACIN Take 3 tablets (1,500 mg total) by mouth at bedtime.   nitroGLYCERIN 0.4 MG SL tablet Commonly known as:  NITROSTAT Place 1 tablet (0.4 mg total) under the tongue every 5 (five) minutes as needed for chest pain.   nystatin 100000 UNIT/ML suspension Commonly known as:  MYCOSTATIN Take 5 mLs (500,000 Units total) by mouth 4 (four) times daily.   predniSONE 20 MG tablet Commonly known as:  DELTASONE Take 2 tablets (40 mg total) by mouth daily with breakfast for 3 days.   rosuvastatin 20 MG tablet Commonly known as:  CRESTOR Take 1 tablet (20 mg total) by mouth daily. What changed:  when to take this   tamsulosin 0.4 MG Caps capsule Commonly known as:  FLOMAX TAKE ONE CAPSULE BY MOUTH TWICE A DAY. What changed:  when to take this   warfarin 5 MG tablet Commonly known as:  COUMADIN Take as directed. If you are unsure how to take this medication, talk to your nurse or doctor. Original instructions:  TAKE ONE TABLET ON MONDAY WEDNESDAY AND SATURDAY, TAKE ONE AND ONE-HALF ON SUNDAY TUESDAY FRIDAY What changed:    how much to take  how to take this  when to take this  additional instructions            Durable Medical Equipment  (From admission, onward)         Start     Ordered   06/15/18 0852  For home use only DME oxygen  Once    Question Answer Comment  Mode or (Route) Nasal cannula   Liters per Minute 5   Frequency Continuous (stationary and portable oxygen unit needed)   Oxygen conserving device Yes   Oxygen delivery system Gas      12 /27/19 0851           Brief H and P: For complete details please refer to admission H and P, but in brief Patient is a 77 year old male with lung cancer, COPD, CAD, atrial fibrillation, peripheral vascular disease who saw his PCP on the day of  admission for 1 week of progressive worsening shortness of breath and DOE.  No chest pain or fevers.  Patient reported reported coughing but no lower extremity edema.  Patient was supposed to follow-up with his oncologist in January to resume any treatment if needed.  He is completed 1 year of immunotherapy.  At his PCPs office, O2 sats were in 5s.  Patient was given treatment Solu-Medrol and O2 supplementation. In ED, found to be having troponin elevation, patient was placed on heparin drip.  He was transferred to [facility] for further intervention.   Hospital Course:  Acute respiratory failure with hypoxia -Multifactorial likely due to  NSTEMI, acute COPD exacerbation, acute on chronic diastolic CHF -Still hypoxic, patient received adequate diuresis, home O2 evaluation done multiple times, requiring 5 to 6 L of O2 -Continue Bumex at home, outpatient follow-up with cardiology  -2D echo 12/20 had shown EF of 35 to 40% with RV pressure overload, RV systolic function moderately reduced   NSTEMI with significant cardiomyopathy likely ischemic -Patient was placed on heparin drip, cardiology was consulted.  Underwent left heart cath which showed patent grafts, recommended medical management. -Per Dr. Harl Bowie, continue aspirin, started on Toprol-XL 12.5 mg daily, continue Crestor.  If BP tolerates, start low-dose lisinopril 2.5 mg daily  Acute on chronic diastolic CHF with RV dysfunction -2D echo on 06/08/2018 showed EF of 35 to 40% with right ventricular systolic function reduced, RV pressure overload -Patient takes Bumex at home, continue low-dose Toprol-XL and lisinopril. Negative balance of 8.5 L, cardiology following closely, recommended DC IV Lasix and start home dose of Bumex in a.m.   Chronic atrial fibrillation Rate controlled, continue Toprol-XL Continue Coumadin  COPD exacerbation Mildly improving, continue IV steroids, doxycycline, scheduled nebs Home O2 evaluation done,  requires 5 L at rest and 6 L on ambulation, O2 sats desats to 80s  CAD status post CABG Continue aspirin, beta-blocker, statin therapy, ACE inhibitor if BP tolerates  Advanced dementia Continue Aricept  Peripheral vascular disease status post AAA repair with diffuse PAD Follows vascular surgery outpatient  NSCLC Status post radiation chemotherapy and immunotherapy, repeat imaging scheduled in 06/2018 followed by Dr. Julien Nordmann as an outpatient.  Patient has a scheduled appointment in January.  Generalize debility, goals of care Patient wants to go home, discussed in detail with patient's wife at the bedside, has significant debility, PT evaluation recommending skilled nursing facility.  Patient's wife stated that she is at home 24/7 and he will not do well in skilled nursing facility, patient adamantly wants to go home and feels more comfortable in home environment. Case management to arrange home health PT OT, home health aide, RN   Day of Discharge S: Wants to go home  BP 112/60   Pulse 63   Temp (!) 97.5 F (36.4 C) (Oral)   Resp 14   Ht 5\' 7"  (1.702 m)   Wt 56 kg   SpO2 (!) 89%   BMI 19.34 kg/m   Physical Exam: General: Alert and awake oriented x place and person, has short-term memory deficits HEENT: anicteric sclera, pupils reactive to light and accommodation CVS: S1-S2 clear no murmur rubs or gallops Chest: Decreased breath sound at the bases Abdomen: soft nontender, nondistended, normal bowel sounds Extremities: no cyanosis, clubbing or edema noted bilaterally Neuro: No new deficit   The results of significant diagnostics from this hospitalization (including imaging, microbiology, ancillary and laboratory) are listed below for reference.      Procedures/Studies:  Dg Chest 2 View  Result Date: 06/07/2018 CLINICAL DATA:  Hypoxia.  Shortness of breath. EXAM: CHEST - 2 VIEW COMPARISON:  CT scan May 08, 2017 FINDINGS: Opacity in the right perihilar region  is consistent with post therapeutic/radiation changes and is similar since November 2019. No pneumothorax. The cardiomediastinal silhouette is stable. No new nodules or masses. Mild scarring in the left base. No new suspicious infiltrate. IMPRESSION: Post therapeutic changes on the right.  No acute interval changes. Electronically Signed   By: Dorise Bullion III M.D   On: 06/07/2018 17:28   Ct Angio Chest Pe W/cm &/or Wo Cm  Result Date: 06/07/2018 CLINICAL DATA:  Hypoxia.  Shortness of breath for the past week. Right lung cancer. EXAM: CT ANGIOGRAPHY CHEST WITH CONTRAST TECHNIQUE: Multidetector CT imaging of the chest was performed using the standard protocol during bolus administration of intravenous contrast. Multiplanar CT image reconstructions and MIPs were obtained to evaluate the vascular anatomy. CONTRAST:  193mL ISOVUE-370 IOPAMIDOL (ISOVUE-370) INJECTION 76% COMPARISON:  CT chest dated May 08, 2018. FINDINGS: Cardiovascular: Satisfactory opacification of the pulmonary arteries to the segmental level. No evidence of pulmonary embolism. Stable cardiomegaly. No pericardial effusion. Prior CABG. No thoracic aortic aneurysm. Evaluation for aortic dissection is limited due to phase of contrast. Coronary, aortic arch, and branch vessel atherosclerotic vascular disease. 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 Mediastinum/Nodes: Stable right low jugular and paratracheal lymphadenopathy measuring up to 16 mm. Unchanged 9 mm subcarinal lymph node. Unchanged 10 mm left hilar lymph nodes. No axillary lymphadenopathy. The thyroid gland, trachea, and esophagus demonstrate no significant findings. Lungs/Pleura: Moderate centrilobular emphysema again noted. Unchanged 5 mm pulmonary nodule in the right upper lobe. Previously seen 5 mm nodule in the inferior right upper lobe is no longer identified. Unchanged geographic consolidation and  traction bronchiectasis centered about the medial and perihilar right lung. Unchanged chronic right pleural thickening. No pneumothorax, pleural effusion or new airspace consolidation. Upper Abdomen: No acute abnormality. Musculoskeletal: No chest wall abnormality. No acute or significant osseous findings. Chronic T6 and T8 mild compression deformities are unchanged. Review of the MIP images confirms the above findings. IMPRESSION: 1. No evidence of pulmonary embolism. No acute intrathoracic process. 2. Unchanged right perihilar radiation fibrosis. 3. Stable lower cervical and right paratracheal lymphadenopathy. 4. Previously seen new 5 mm pulmonary nodule in the right upper lobe is no longer identified and was likely inflammatory or infectious. 5. 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 6.  Emphysema (ICD10-J43.9). 7.  Aortic atherosclerosis (ICD10-I70.0). Electronically Signed   By: Titus Dubin M.D.   On: 06/07/2018 21:32      LAB RESULTS: Basic Metabolic Panel: Recent Labs  Lab 06/11/18 0544  06/14/18 0633 06/15/18 0914  NA 130*   < > 128* 128*  K 4.5   < > 4.7 4.2  CL 96*   < > 96* 93*  CO2 23   < > 21* 25  GLUCOSE 131*   < > 129* 101*  BUN 31*   < > 30* 34*  CREATININE 0.91   < > 0.94 0.97  CALCIUM 9.1   < > 8.2* 8.6*  MG 1.9  --   --   --    < > = values in this interval not displayed.   Liver Function Tests: No results for input(s): AST, ALT, ALKPHOS, BILITOT, PROT, ALBUMIN in the last 168 hours. No results for input(s): LIPASE, AMYLASE in the last 168 hours. No results for input(s): AMMONIA in the last 168 hours. CBC: Recent Labs  Lab 06/14/18 0258 06/15/18 0223  WBC 12.9* 12.7*  HGB 14.7 13.7  HCT 41.3 39.1  MCV 86.6 86.3  PLT 122* 131*   Cardiac Enzymes: No results for input(s): CKTOTAL, CKMB, CKMBINDEX, TROPONINI in the last 168 hours. BNP: Invalid input(s): POCBNP CBG: No results  for input(s): GLUCAP in the last 168 hours.    Disposition and Follow-up: Discharge Instructions    Diet - low sodium heart healthy   Complete by:  As directed  Increase activity slowly   Complete by:  As directed        DISPOSITION: Home with home health   DISCHARGE FOLLOW-UP Follow-up Information    [name], MD. Schedule an appointment as soon as possible for a visit in 2 week(s).   Specialty:  Family Medicine Contact information: [place] [address] [phone]        Josue Hector, MD. Schedule an appointment as soon as possible for a visit in 2 week(s).   Specialty:  Cardiology Contact information: [address] [place] [address] [phone]            Time coordinating discharge:  40 minutes  Signed:   Estill Cotta M.D. [facility] 06/15/2018, 12:57 PM Pager: [phone]

## 2018-06-15 NOTE — Progress Notes (Signed)
ANTICOAGULATION CONSULT NOTE - Follow Up Consult  Pharmacy Consult for warfarin Indication: atrial fibrillation  Allergies  Allergen Reactions  . Neurontin [Gabapentin] Other (See Comments)    Caused confusion  . Imitrex [Sumatriptan] Nausea And Vomiting and Other (See Comments)    "made me like I was having a stoke"  . Lipitor [Atorvastatin] Nausea And Vomiting and Other (See Comments)    INTOLERANCE > MYALGIAS "couldn't get out of the bed ( pt had to crawl out of bed ) I hurt so bad"    Patient Measurements: Height: 5\' 7"  (170.2 cm) Weight: 123 lb 7.3 oz (56 kg) IBW/kg (Calculated) : 66.1 Heparin Dosing Weight: 55.6 kg  Vital Signs: Temp: 97.5 F (36.4 C) (12/27 0746) Temp Source: Oral (12/27 0746) BP: 112/60 (12/27 0746) Pulse Rate: 63 (12/27 0746)  Labs: Recent Labs    06/13/18 0327 06/13/18 0804 06/14/18 0258 06/14/18 0633 06/15/18 0223  HGB 14.6  --  14.7  --  13.7  HCT 41.4  --  41.3  --  39.1  PLT 125*  --  122*  --  131*  LABPROT  --  19.0*  --  18.2* 18.9*  INR  --  1.62  --  1.52 1.60  CREATININE 0.77  --   --  0.94  --     Estimated Creatinine Clearance: 50.5 mL/min (by C-G formula based on SCr of 0.94 mg/dL).  Assessment: 77 y.o.malewith past medical history of CAD (s/p CABG in 1995, cath in 2008) on warfarin prior to admission for atrial fibrillation/ history of DVTs presenting with elevated serial troponins and likely NSTEMI. Pharmacy has been consulted to manage warfarin. Per 06/01/18 note from [place], PTA warfarin dose is 5 mg daily (last dose 12/18).  INR is subtherapeutic at 1.6 but expect to trend up soon. No bleeding noted, Hgb stable in 14s, platelets are low stable.  Goal of Therapy:  INR 2-3 Monitor platelets by anticoagulation protocol: Yes   Plan:  Repeat warfarin 7.5 mg PO tonight Daily INR, and CBC Monitor for s/sx bleeding  Thank you for allowing pharmacy to be a part of this patient's care.  Renold Genta,  PharmD, BCPS Clinical Pharmacist Clinical phone for 06/15/2018 until 3p is [phone] 06/15/2018 8:35 AM  **Pharmacist phone directory can now be found on [email or web address] listed under [place]**

## 2018-06-15 NOTE — Telephone Encounter (Signed)
[name] Kirschner, MD  [name] Noun, LPN        Call pt, see how shes doing, sched f u with me mid next wk   I called pt but had to leave a message to return call.

## 2018-06-15 NOTE — Care Management Note (Signed)
Case Management Note  Patient Details  Name: [name] [name] MRN: [id number] Date of Birth: 18-Aug-1940  Subjective/Objective:                    Action/Plan: Pt discharging home with oxygen and HH. Pt has transport tanks in the room. Oxygen to be delivered to the home today.  CM called Jermaine with [facility] and updated him about d/c and HH orders.  Wife providing transport home.   Expected Discharge Date:  06/15/18               Expected Discharge Plan:  [place]  In-House Referral:     Discharge planning Services  CM Consult  Post Acute Care Choice:  Home Health, Durable Medical Equipment Choice offered to:  Patient, Spouse, Adult Children  DME Arranged:  Oxygen DME Agency:  [place]:    [place] Agency:  [place]  Status of Service:  In process, will continue to follow  If discussed at Long Length of Stay Meetings, dates discussed:    Additional Comments:  Pollie Friar, RN 06/15/2018, 2:26 PM

## 2018-06-16 DIAGNOSIS — J441 Chronic obstructive pulmonary disease with (acute) exacerbation: Secondary | ICD-10-CM | POA: Diagnosis not present

## 2018-06-16 DIAGNOSIS — Z95828 Presence of other vascular implants and grafts: Secondary | ICD-10-CM | POA: Diagnosis not present

## 2018-06-16 DIAGNOSIS — C342 Malignant neoplasm of middle lobe, bronchus or lung: Secondary | ICD-10-CM | POA: Diagnosis not present

## 2018-06-16 DIAGNOSIS — Z951 Presence of aortocoronary bypass graft: Secondary | ICD-10-CM | POA: Diagnosis not present

## 2018-06-16 DIAGNOSIS — I5033 Acute on chronic diastolic (congestive) heart failure: Secondary | ICD-10-CM | POA: Diagnosis not present

## 2018-06-16 DIAGNOSIS — Z7901 Long term (current) use of anticoagulants: Secondary | ICD-10-CM | POA: Diagnosis not present

## 2018-06-16 DIAGNOSIS — I255 Ischemic cardiomyopathy: Secondary | ICD-10-CM | POA: Diagnosis not present

## 2018-06-16 DIAGNOSIS — I110 Hypertensive heart disease with heart failure: Secondary | ICD-10-CM | POA: Diagnosis not present

## 2018-06-16 DIAGNOSIS — I214 Non-ST elevation (NSTEMI) myocardial infarction: Secondary | ICD-10-CM | POA: Diagnosis not present

## 2018-06-16 DIAGNOSIS — Z9981 Dependence on supplemental oxygen: Secondary | ICD-10-CM | POA: Diagnosis not present

## 2018-06-16 DIAGNOSIS — Z955 Presence of coronary angioplasty implant and graft: Secondary | ICD-10-CM | POA: Diagnosis not present

## 2018-06-16 DIAGNOSIS — I2510 Atherosclerotic heart disease of native coronary artery without angina pectoris: Secondary | ICD-10-CM | POA: Diagnosis not present

## 2018-06-16 DIAGNOSIS — I482 Chronic atrial fibrillation, unspecified: Secondary | ICD-10-CM | POA: Diagnosis not present

## 2018-06-16 DIAGNOSIS — F0390 Unspecified dementia without behavioral disturbance: Secondary | ICD-10-CM | POA: Diagnosis not present

## 2018-06-16 DIAGNOSIS — Z96653 Presence of artificial knee joint, bilateral: Secondary | ICD-10-CM | POA: Diagnosis not present

## 2018-06-16 DIAGNOSIS — Z7951 Long term (current) use of inhaled steroids: Secondary | ICD-10-CM | POA: Diagnosis not present

## 2018-06-17 ENCOUNTER — Other Ambulatory Visit: Payer: Self-pay

## 2018-06-17 ENCOUNTER — Encounter (HOSPITAL_COMMUNITY): Payer: Self-pay

## 2018-06-17 ENCOUNTER — Emergency Department (HOSPITAL_COMMUNITY): Payer: Medicare Other

## 2018-06-17 ENCOUNTER — Inpatient Hospital Stay (HOSPITAL_COMMUNITY)
Admission: EM | Admit: 2018-06-17 | Discharge: 2018-06-19 | DRG: 640 | Disposition: A | Discharge status: Hospice | Payer: Medicare Other | Attending: Internal Medicine | Admitting: Internal Medicine

## 2018-06-17 DIAGNOSIS — Z9981 Dependence on supplemental oxygen: Secondary | ICD-10-CM

## 2018-06-17 DIAGNOSIS — R918 Other nonspecific abnormal finding of lung field: Secondary | ICD-10-CM | POA: Diagnosis not present

## 2018-06-17 DIAGNOSIS — G9341 Metabolic encephalopathy: Secondary | ICD-10-CM | POA: Diagnosis present

## 2018-06-17 DIAGNOSIS — Z888 Allergy status to other drugs, medicaments and biological substances status: Secondary | ICD-10-CM

## 2018-06-17 DIAGNOSIS — I2510 Atherosclerotic heart disease of native coronary artery without angina pectoris: Secondary | ICD-10-CM | POA: Diagnosis present

## 2018-06-17 DIAGNOSIS — E861 Hypovolemia: Secondary | ICD-10-CM | POA: Diagnosis present

## 2018-06-17 DIAGNOSIS — I714 Abdominal aortic aneurysm, without rupture, unspecified: Secondary | ICD-10-CM | POA: Diagnosis present

## 2018-06-17 DIAGNOSIS — Z87891 Personal history of nicotine dependence: Secondary | ICD-10-CM

## 2018-06-17 DIAGNOSIS — I214 Non-ST elevation (NSTEMI) myocardial infarction: Secondary | ICD-10-CM | POA: Diagnosis present

## 2018-06-17 DIAGNOSIS — Z66 Do not resuscitate: Secondary | ICD-10-CM | POA: Diagnosis present

## 2018-06-17 DIAGNOSIS — D696 Thrombocytopenia, unspecified: Secondary | ICD-10-CM | POA: Diagnosis present

## 2018-06-17 DIAGNOSIS — F0390 Unspecified dementia without behavioral disturbance: Secondary | ICD-10-CM | POA: Diagnosis present

## 2018-06-17 DIAGNOSIS — I4891 Unspecified atrial fibrillation: Secondary | ICD-10-CM | POA: Diagnosis present

## 2018-06-17 DIAGNOSIS — I5022 Chronic systolic (congestive) heart failure: Secondary | ICD-10-CM | POA: Diagnosis present

## 2018-06-17 DIAGNOSIS — K219 Gastro-esophageal reflux disease without esophagitis: Secondary | ICD-10-CM | POA: Diagnosis present

## 2018-06-17 DIAGNOSIS — J9601 Acute respiratory failure with hypoxia: Secondary | ICD-10-CM | POA: Diagnosis present

## 2018-06-17 DIAGNOSIS — I480 Paroxysmal atrial fibrillation: Secondary | ICD-10-CM | POA: Diagnosis present

## 2018-06-17 DIAGNOSIS — F0280 Dementia in other diseases classified elsewhere without behavioral disturbance: Secondary | ICD-10-CM | POA: Diagnosis not present

## 2018-06-17 DIAGNOSIS — Z923 Personal history of irradiation: Secondary | ICD-10-CM

## 2018-06-17 DIAGNOSIS — I110 Hypertensive heart disease with heart failure: Secondary | ICD-10-CM | POA: Diagnosis present

## 2018-06-17 DIAGNOSIS — Z515 Encounter for palliative care: Secondary | ICD-10-CM | POA: Diagnosis present

## 2018-06-17 DIAGNOSIS — Z951 Presence of aortocoronary bypass graft: Secondary | ICD-10-CM

## 2018-06-17 DIAGNOSIS — I255 Ischemic cardiomyopathy: Secondary | ICD-10-CM | POA: Diagnosis present

## 2018-06-17 DIAGNOSIS — C3491 Malignant neoplasm of unspecified part of right bronchus or lung: Secondary | ICD-10-CM | POA: Diagnosis present

## 2018-06-17 DIAGNOSIS — Z79890 Hormone replacement therapy (postmenopausal): Secondary | ICD-10-CM

## 2018-06-17 DIAGNOSIS — R627 Adult failure to thrive: Secondary | ICD-10-CM | POA: Diagnosis present

## 2018-06-17 DIAGNOSIS — Z7901 Long term (current) use of anticoagulants: Secondary | ICD-10-CM

## 2018-06-17 DIAGNOSIS — I4510 Unspecified right bundle-branch block: Secondary | ICD-10-CM | POA: Diagnosis not present

## 2018-06-17 DIAGNOSIS — I959 Hypotension, unspecified: Secondary | ICD-10-CM | POA: Diagnosis present

## 2018-06-17 DIAGNOSIS — Z7189 Other specified counseling: Secondary | ICD-10-CM | POA: Diagnosis not present

## 2018-06-17 DIAGNOSIS — E871 Hypo-osmolality and hyponatremia: Secondary | ICD-10-CM | POA: Diagnosis not present

## 2018-06-17 DIAGNOSIS — R778 Other specified abnormalities of plasma proteins: Secondary | ICD-10-CM | POA: Insufficient documentation

## 2018-06-17 DIAGNOSIS — R410 Disorientation, unspecified: Secondary | ICD-10-CM | POA: Diagnosis present

## 2018-06-17 DIAGNOSIS — I739 Peripheral vascular disease, unspecified: Secondary | ICD-10-CM | POA: Diagnosis present

## 2018-06-17 DIAGNOSIS — Z8249 Family history of ischemic heart disease and other diseases of the circulatory system: Secondary | ICD-10-CM

## 2018-06-17 DIAGNOSIS — R0602 Shortness of breath: Secondary | ICD-10-CM | POA: Diagnosis not present

## 2018-06-17 DIAGNOSIS — Z9049 Acquired absence of other specified parts of digestive tract: Secondary | ICD-10-CM

## 2018-06-17 DIAGNOSIS — R7989 Other specified abnormal findings of blood chemistry: Secondary | ICD-10-CM | POA: Diagnosis not present

## 2018-06-17 DIAGNOSIS — M810 Age-related osteoporosis without current pathological fracture: Secondary | ICD-10-CM | POA: Diagnosis present

## 2018-06-17 DIAGNOSIS — Z7982 Long term (current) use of aspirin: Secondary | ICD-10-CM

## 2018-06-17 DIAGNOSIS — Z8679 Personal history of other diseases of the circulatory system: Secondary | ICD-10-CM

## 2018-06-17 DIAGNOSIS — Z87442 Personal history of urinary calculi: Secondary | ICD-10-CM

## 2018-06-17 DIAGNOSIS — Z9221 Personal history of antineoplastic chemotherapy: Secondary | ICD-10-CM

## 2018-06-17 DIAGNOSIS — J441 Chronic obstructive pulmonary disease with (acute) exacerbation: Secondary | ICD-10-CM | POA: Diagnosis present

## 2018-06-17 DIAGNOSIS — G988 Other disorders of nervous system: Secondary | ICD-10-CM

## 2018-06-17 DIAGNOSIS — R6510 Systemic inflammatory response syndrome (SIRS) of non-infectious origin without acute organ dysfunction: Secondary | ICD-10-CM | POA: Diagnosis present

## 2018-06-17 DIAGNOSIS — Z809 Family history of malignant neoplasm, unspecified: Secondary | ICD-10-CM

## 2018-06-17 DIAGNOSIS — R0902 Hypoxemia: Secondary | ICD-10-CM | POA: Diagnosis not present

## 2018-06-17 DIAGNOSIS — Z79899 Other long term (current) drug therapy: Secondary | ICD-10-CM

## 2018-06-17 DIAGNOSIS — I252 Old myocardial infarction: Secondary | ICD-10-CM

## 2018-06-17 DIAGNOSIS — C3490 Malignant neoplasm of unspecified part of unspecified bronchus or lung: Secondary | ICD-10-CM

## 2018-06-17 DIAGNOSIS — Z955 Presence of coronary angioplasty implant and graft: Secondary | ICD-10-CM

## 2018-06-17 DIAGNOSIS — R1319 Other dysphagia: Secondary | ICD-10-CM | POA: Diagnosis present

## 2018-06-17 DIAGNOSIS — E039 Hypothyroidism, unspecified: Secondary | ICD-10-CM | POA: Diagnosis present

## 2018-06-17 DIAGNOSIS — E7800 Pure hypercholesterolemia, unspecified: Secondary | ICD-10-CM | POA: Diagnosis present

## 2018-06-17 LAB — LACTIC ACID, PLASMA: Lactic Acid, Venous: 1.7 mmol/L (ref 0.5–1.9)

## 2018-06-17 LAB — URINALYSIS, ROUTINE W REFLEX MICROSCOPIC
Bilirubin Urine: NEGATIVE
Glucose, UA: NEGATIVE mg/dL
Ketones, ur: 5 mg/dL — AB
Leukocytes, UA: NEGATIVE
Nitrite: NEGATIVE
Protein, ur: NEGATIVE mg/dL
Specific Gravity, Urine: 1.009 (ref 1.005–1.030)
pH: 6 (ref 5.0–8.0)

## 2018-06-17 LAB — CBC WITH DIFFERENTIAL/PLATELET
Abs Immature Granulocytes: 0.23 10*3/uL — ABNORMAL HIGH (ref 0.00–0.07)
Basophils Absolute: 0 10*3/uL (ref 0.0–0.1)
Basophils Relative: 0 %
Eosinophils Absolute: 0 10*3/uL (ref 0.0–0.5)
Eosinophils Relative: 0 %
HCT: 44.1 % (ref 39.0–52.0)
Hemoglobin: 15.1 g/dL (ref 13.0–17.0)
Immature Granulocytes: 1 %
Lymphocytes Relative: 4 %
Lymphs Abs: 0.7 10*3/uL (ref 0.7–4.0)
MCH: 29.7 pg (ref 26.0–34.0)
MCHC: 34.2 g/dL (ref 30.0–36.0)
MCV: 86.6 fL (ref 80.0–100.0)
Monocytes Absolute: 0.7 10*3/uL (ref 0.1–1.0)
Monocytes Relative: 4 %
Neutro Abs: 15.2 10*3/uL — ABNORMAL HIGH (ref 1.7–7.7)
Neutrophils Relative %: 91 %
Platelets: 118 10*3/uL — ABNORMAL LOW (ref 150–400)
RBC: 5.09 MIL/uL (ref 4.22–5.81)
RDW: 15.3 % (ref 11.5–15.5)
WBC: 16.9 10*3/uL — ABNORMAL HIGH (ref 4.0–10.5)
nRBC: 0 % (ref 0.0–0.2)

## 2018-06-17 LAB — BRAIN NATRIURETIC PEPTIDE: B NATRIURETIC PEPTIDE 5: 797 pg/mL — AB (ref 0.0–100.0)

## 2018-06-17 LAB — BASIC METABOLIC PANEL
Anion gap: 14 (ref 5–15)
BUN: 31 mg/dL — ABNORMAL HIGH (ref 8–23)
CO2: 22 mmol/L (ref 22–32)
CREATININE: 0.93 mg/dL (ref 0.61–1.24)
Calcium: 8.5 mg/dL — ABNORMAL LOW (ref 8.9–10.3)
Chloride: 84 mmol/L — ABNORMAL LOW (ref 98–111)
GFR calc Af Amer: >60 mL/min (ref >60)
GFR calc non Af Amer: >60 mL/min (ref >60)
Glucose, Bld: 105 mg/dL — ABNORMAL HIGH (ref 70–99)
Potassium: 3.8 mmol/L (ref 3.5–5.1)
Sodium: 120 mmol/L — ABNORMAL LOW (ref 135–145)

## 2018-06-17 LAB — TROPONIN I: Troponin I: 0.26 ng/mL (ref <0.03)

## 2018-06-17 LAB — PROTIME-INR
INR: 2.14
Prothrombin Time: 23.7 seconds — ABNORMAL HIGH (ref 11.4–15.2)

## 2018-06-17 MED ORDER — MOMETASONE FURO-FORMOTEROL FUM 100-5 MCG/ACT IN AERO
2.0000 | INHALATION_SPRAY | Freq: Two times a day (BID) | RESPIRATORY_TRACT | Status: DC
  Administered 2018-06-18 – 2018-06-19 (×3): 2 via RESPIRATORY_TRACT
  Filled 2018-06-17: qty 8.8

## 2018-06-17 MED ORDER — ONDANSETRON HCL 4 MG PO TABS: 4.0000 mg | ORAL_TABLET | Freq: Four times a day (QID) | ORAL | Status: DC | PRN

## 2018-06-17 MED ORDER — ALBUTEROL SULFATE (2.5 MG/3ML) 0.083% IN NEBU: 3.0000 mL | INHALATION_SOLUTION | Freq: Four times a day (QID) | RESPIRATORY_TRACT | Status: DC | PRN

## 2018-06-17 MED ORDER — POLYETHYLENE GLYCOL 3350 17 G PO PACK: 17.0000 g | PACK | Freq: Every day | ORAL | Status: DC | PRN

## 2018-06-17 MED ORDER — LINACLOTIDE 145 MCG PO CAPS
145.0000 ug | ORAL_CAPSULE | Freq: Every day | ORAL | Status: DC
  Administered 2018-06-18 – 2018-06-19 (×2): 145 ug via ORAL
  Filled 2018-06-17 (×2): qty 1

## 2018-06-17 MED ORDER — PREDNISONE 20 MG PO TABS
40.0000 mg | ORAL_TABLET | Freq: Every day | ORAL | Status: DC
  Administered 2018-06-18: 40 mg via ORAL
  Filled 2018-06-17: qty 2

## 2018-06-17 MED ORDER — TRAZODONE HCL 50 MG PO TABS
50.0000 mg | ORAL_TABLET | Freq: Every evening | ORAL | Status: DC | PRN
  Administered 2018-06-18: 50 mg via ORAL
  Filled 2018-06-17: qty 1

## 2018-06-17 MED ORDER — WARFARIN SODIUM 7.5 MG PO TABS: 7.5000 mg | ORAL_TABLET | Freq: Once | ORAL | Status: DC

## 2018-06-17 MED ORDER — NYSTATIN 100000 UNIT/ML MT SUSP
5.0000 mL | Freq: Four times a day (QID) | OROMUCOSAL | Status: DC
  Administered 2018-06-17 – 2018-06-19 (×6): 500000 [IU] via ORAL
  Filled 2018-06-17 (×6): qty 5

## 2018-06-17 MED ORDER — SODIUM CHLORIDE 0.9 % IV BOLUS
250.0000 mL | Freq: Once | INTRAVENOUS | Status: AC
  Administered 2018-06-17: 250 mL via INTRAVENOUS

## 2018-06-17 MED ORDER — TAMSULOSIN HCL 0.4 MG PO CAPS
0.4000 mg | ORAL_CAPSULE | Freq: Every day | ORAL | Status: DC
  Administered 2018-06-19: 0.4 mg via ORAL
  Filled 2018-06-17 (×2): qty 1

## 2018-06-17 MED ORDER — LEVOTHYROXINE SODIUM 112 MCG PO TABS
112.0000 ug | ORAL_TABLET | Freq: Every day | ORAL | Status: DC
  Administered 2018-06-18 – 2018-06-19 (×2): 112 ug via ORAL
  Filled 2018-06-17 (×2): qty 1

## 2018-06-17 MED ORDER — ROSUVASTATIN CALCIUM 20 MG PO TABS
20.0000 mg | ORAL_TABLET | Freq: Every day | ORAL | Status: DC
  Administered 2018-06-17: 20 mg via ORAL
  Filled 2018-06-17: qty 1

## 2018-06-17 MED ORDER — WARFARIN SODIUM 5 MG PO TABS
7.5000 mg | ORAL_TABLET | Freq: Once | ORAL | Status: AC
  Administered 2018-06-18: 7.5 mg via ORAL
  Filled 2018-06-17: qty 2

## 2018-06-17 MED ORDER — DONEPEZIL HCL 5 MG PO TABS
10.0000 mg | ORAL_TABLET | Freq: Every day | ORAL | Status: DC
  Administered 2018-06-17: 10 mg via ORAL
  Filled 2018-06-17: qty 2

## 2018-06-17 MED ORDER — ASPIRIN EC 81 MG PO TBEC
81.0000 mg | DELAYED_RELEASE_TABLET | Freq: Every day | ORAL | Status: DC
  Administered 2018-06-18: 81 mg via ORAL
  Filled 2018-06-17: qty 1

## 2018-06-17 MED ORDER — SODIUM CHLORIDE 0.9 % IV SOLN
INTRAVENOUS | Status: DC
  Administered 2018-06-18: 04:00:00 via INTRAVENOUS

## 2018-06-17 MED ORDER — ALBUTEROL SULFATE (2.5 MG/3ML) 0.083% IN NEBU
2.5000 mg | INHALATION_SOLUTION | Freq: Once | RESPIRATORY_TRACT | Status: AC
  Administered 2018-06-17: 2.5 mg via RESPIRATORY_TRACT

## 2018-06-17 MED ORDER — PANTOPRAZOLE SODIUM 20 MG PO TBEC
20.0000 mg | DELAYED_RELEASE_TABLET | Freq: Every day | ORAL | Status: DC
  Filled 2018-06-17: qty 1

## 2018-06-17 MED ORDER — ACETAMINOPHEN 325 MG PO TABS: 325.0000 mg | ORAL_TABLET | Freq: Every day | ORAL | Status: DC | PRN

## 2018-06-17 MED ORDER — OXYCODONE HCL 5 MG PO TABS: 5.0000 mg | ORAL_TABLET | ORAL | Status: DC | PRN

## 2018-06-17 MED ORDER — ALBUTEROL SULFATE (2.5 MG/3ML) 0.083% IN NEBU
INHALATION_SOLUTION | RESPIRATORY_TRACT | Status: AC
  Filled 2018-06-17: qty 3

## 2018-06-17 MED ORDER — ONDANSETRON HCL 4 MG/2ML IJ SOLN: 4.0000 mg | Freq: Four times a day (QID) | INTRAMUSCULAR | Status: DC | PRN

## 2018-06-17 MED ORDER — NITROGLYCERIN 0.4 MG SL SUBL: 0.4000 mg | SUBLINGUAL_TABLET | SUBLINGUAL | Status: DC | PRN

## 2018-06-17 MED ORDER — NIACIN ER 500 MG PO TBCR
1500.0000 mg | EXTENDED_RELEASE_TABLET | Freq: Every day | ORAL | Status: DC
  Administered 2018-06-18: 1500 mg via ORAL
  Filled 2018-06-17 (×3): qty 3

## 2018-06-17 NOTE — ED Provider Notes (Signed)
[facility] EMERGENCY DEPARTMENT Provider Note   CSN: [id number] Arrival date & time: 06/17/18  1544     History   Chief Complaint Chief Complaint  Patient presents with  . Shortness of Breath    HPI [name] [name] is a 77 y.o. male.  He is brought in by ambulance for evaluation of shortness of breath and hypoxia.  He is a history of CHF COPD A. fib anticoagulation lung cancer.  He uses oxygen nasal cannula 24/7.  Per EMS he was being transferred from the toilet back to a chair when family checked his pulse ox and found to be very low.  Patient himself is a poor historian level 5 caveat secondary to dementia.  He denies any complaints.  He does not feel short of breath and is not having any chest pain or cough.  Denies any nausea vomiting or diarrhea.  He does not know where he is but he knows it is not home.  He does not know the day or date.  The history is provided by the patient and the EMS personnel.  Shortness of Breath  This is a recurrent problem. The problem occurs frequently.The problem has not changed since onset.Pertinent negatives include no fever, no headaches, no sore throat, no neck pain, no cough, no sputum production, no hemoptysis, no chest pain, no syncope, no abdominal pain and no rash. It is unknown what precipitated the problem. He has tried nothing for the symptoms. The treatment provided no relief.    Past Medical History:  Diagnosis Date  . Adenocarcinoma of right lung, stage 3 ([place]) 09/20/2016  . Atrial fibrillation ([place])    no hx of reported at preop visit of 04/21/11   . CAD (coronary artery disease)   . Carotid artery occlusion    left carotid endarterectomy  . Chronic kidney disease    AAA repair with reimplant of renals   . CHRONIC OBSTRUCTIVE PULMONARY DISEASE    pt denied at visit of 04/21/11   . CORONARY ARTERY DISEASE   . Dehydration 11/15/2016  . Disorder of left sacroiliac joint 08/24/2016  . DIVERTICULOSIS OF COLON   . Encounter for  antineoplastic chemotherapy 10/17/2016  . GERD   . H/O hiatal hernia   . Headache(784.0)    Hx: of years of years  . HYPERTENSION   . HYPOTHYROIDISM   . JOINT EFFUSION, KNEE   . KNEE, ARTHRITIS, DEGEN./OSTEO    right knee   . Myocardial infarction ([place])    1994   . NEPHROLITHIASIS, HX OF   . OSTEOPOROSIS   . Other dysphagia   . PERIPHERAL VASCULAR DISEASE    AAA - 1994 with reimplant of renals   . Peripheral vascular disease ([place])    subclavian stenosis PTA - 3/08   . Pure hypercholesterolemia   . Renal artery stenosis [facility])     Patient Active Problem List   Diagnosis Date Noted  . Protein-calorie malnutrition, severe 06/12/2018  . Pressure injury of skin 06/12/2018  . Ischemic cardiomyopathy   . Acute respiratory failure with hypoxia ([place]) 06/07/2018  . Dementia, neurological ([place]) 02/14/2017  . Encounter for antineoplastic immunotherapy 01/02/2017  . Goals of care, counseling/discussion 01/02/2017  . Cerebrovascular accident (CVA) due to thrombosis of right carotid artery ([place]) 12/20/2016  . Cerebrovascular accident (CVA) due to embolism of cerebral artery ([place]) 12/20/2016  . Memory loss 12/20/2016  . Dehydration 11/15/2016  . Encounter for antineoplastic chemotherapy 10/17/2016  . Malnutrition of moderate degree 10/06/2016  .  Urinary tract infection 10/03/2016  . T1b N2 M0 disease (stage IIIA).adenocarcinoma of the right middle lobe of lung ([place]) 09/20/2016  . Peripheral edema   . Slow transit constipation   . Acute lower UTI   . Confusion   . SOB (shortness of breath) 09/02/2016  . Urinary incontinence   . Abnormal urinalysis   . Leukocytosis   . HCAP (healthcare-associated pneumonia)   . Hypoalbuminemia due to protein-calorie malnutrition ([place])   . Hypokalemia   . Hyponatremia   . Urinary retention   . Sleep disturbance   . Acute bilateral deep vein thrombosis (DVT) of popliteal veins (H[facility])   . Brain mass 08/31/2016  . Debility   . Benign essential HTN     . Scrotal edema   . Hyperlipidemia   . Constipation due to pain medication   . Fall from height of greater than 3 feet   . Acute blood loss anemia   . Lung mass   . Post-operative pain   . PVD (peripheral vascular disease) ([place])   . Coronary artery disease involving coronary bypass graft of native heart without angina pectoris   . PAF (paroxysmal atrial fibrillation) ([place])   . Disorder of left sacroiliac joint 08/24/2016  . Fall 08/22/2016  . Closed pelvic ring fracture ([place])   . Carotid artery stenosis, asymptomatic 08/12/2014  . Ejection murmur 07/03/2014  . Aftercare following surgery of the circulatory system, [place] 02/11/2014  . Weakness-Abdomin and Bilateral Thigh / Leg 02/11/2014  . Carotid stenosis 07/19/2013  . Occlusion and stenosis of carotid artery without mention of cerebral infarction 07/16/2013  . Impaired fasting glucose 07/14/2013  . Knee pain 05/23/2011  . Stiffness of joint, not elsewhere classified, lower leg 05/23/2011  . Muscle weakness (generalized) 05/23/2011  . Difficulty in walking(719.7) 05/23/2011  . Bruit 11/19/2010  . AAA (abdominal aortic aneurysm) ([place]) 11/19/2010  . OTHER DYSPHAGIA 04/27/2010  . ATRIAL FIBRILLATION 12/10/2008  . JOINT EFFUSION, KNEE 01/14/2008  . KNEE PAIN 01/14/2008  . KNEE, ARTHRITIS, DEGEN./OSTEO 12/11/2007  . Hypothyroidism 10/10/2007  . Pure hypercholesterolemia 10/10/2007  . Essential hypertension 10/10/2007  . Coronary atherosclerosis 10/10/2007  . Peripheral vascular disease ([place]) 10/10/2007  . COPD with acute exacerbation ([place]) 10/10/2007  . GERD 10/10/2007  . DIVERTICULOSIS OF COLON 10/10/2007  . OSTEOPOROSIS 10/10/2007  . NEPHROLITHIASIS, HX OF 10/10/2007  . Personal history of surgery to heart and great vessels, presenting hazards to health 10/10/2007  . CHOLECYSTECTOMY, HX OF 10/10/2007  . CORONARY ARTERY BYPASS GRAFT, HX OF 10/10/2007    Past Surgical History:  Procedure Laterality Date  . ABDOMINAL AORTIC  ANEURYSM REPAIR     with reimplantation of renals   . CARDIAC CATHETERIZATION     2008  . CAROTID ENDARTERECTOMY Left Jan. 30, 2015   CEA  . CHOLECYSTECTOMY  2000   Gall Bladder  . COLONOSCOPY W/ BIOPSIES AND POLYPECTOMY     Hx: of  . CORONARY ARTERY BYPASS GRAFT     1995  . CORONARY STENT PLACEMENT     Hx: of  . ENDARTERECTOMY Left 07/19/2013   Procedure: ENDARTERECTOMY CAROTID;  Surgeon: Mal Misty, MD;  Location: [facility];  Service: Vascular;  Laterality: Left;  . GALLBLADDER SURGERY  2004   . JOINT REPLACEMENT     partial knee replacement on left 2002   . KNEE SURGERY    . ORIF PELVIC FRACTURE Left 08/25/2016   Procedure: OPEN REDUCTION INTERNAL FIXATION (ORIF) PELVIC FRACTURE; plate on front, SI screw on the  back;  Surgeon: Altamese Tensas, MD;  Location: [facility];  Service: Orthopedics;  Laterality: Left;  . OTHER SURGICAL HISTORY     left subclavian stenosis surgery PTA 08/2006   . OTHER SURGICAL HISTORY     carotid surgery on right 2004   . RIGHT/LEFT HEART CATH AND CORONARY/GRAFT ANGIOGRAPHY [name] 06/12/2018   Procedure: RIGHT/LEFT HEART CATH AND CORONARY/GRAFT ANGIOGRAPHY;  Surgeon: Burnell Blanks, MD;  Location: [facility];  Service: Cardiovascular;  Laterality: [name];  . TOTAL KNEE ARTHROPLASTY  04/29/2011   Procedure: TOTAL KNEE ARTHROPLASTY;  Surgeon: Gearlean Alf;  Location: [facility];  Service: Orthopedics;  Laterality: Right;  . urinary retention    . VASCULAR SURGERY     AAA  . VASECTOMY  1973  . VIDEO BRONCHOSCOPY WITH ENDOBRONCHIAL ULTRASOUND [name] 09/09/2016   Procedure: VIDEO BRONCHOSCOPY WITH ENDOBRONCHIAL ULTRASOUND;  Surgeon: Grace Isaac, MD;  Location: [facility];  Service: Thoracic;  Laterality: [name];        Home Medications    Prior to Admission medications   Medication Sig Start Date End Date Taking? Authorizing Provider  acetaminophen (TYLENOL) 325 MG tablet Take 325-650 mg by mouth daily as needed for headache (for pain).     [provider]  albuterol (PROVENTIL HFA;VENTOLIN HFA) 108 (90 Base) MCG/ACT inhaler Inhale 2 puffs into the lungs every 6 (six) hours as needed for wheezing or shortness of breath. 06/14/18   Rai, Vernelle Emerald, MD  aspirin EC 81 MG EC tablet Take 1 tablet (81 mg total) by mouth daily. 06/14/18   Rai, Ripudeep K, MD  bumetanide (BUMEX) 1 MG tablet Take 1 tablet (1 mg total) by mouth daily. 06/15/18   Rai, Ripudeep K, MD  donepezil (ARICEPT) 10 MG tablet Take 1 tablet daily Patient taking differently: Take 10 mg by mouth at bedtime.  04/03/18   Cameron Sprang, MD  lansoprazole (PREVACID) 15 MG capsule Take 1 capsule (15 mg total) by mouth daily. Patient taking differently: Take 15 mg by mouth every morning.  09/16/16   Angiulli, Lavon Paganini, PA-C  levothyroxine (SYNTHROID, LEVOTHROID) 112 MCG tablet Take 1 tablet (112 mcg total) by mouth daily. Patient taking differently: Take 112 mcg by mouth every morning.  08/17/17   [name], MD  linaclotide [facility]) 145 MCG CAPS capsule Take 1 capsule (145 mcg total) by mouth daily before breakfast. Patient taking differently: Take 145 mcg by mouth daily as needed (for constipation).  08/17/17   [name], MD  lisinopril (PRINIVIL,ZESTRIL) 2.5 MG tablet Take 1 tablet (2.5 mg total) by mouth daily. 06/14/18   Rai, Vernelle Emerald, MD  metoprolol succinate (TOPROL-XL) 25 MG 24 hr tablet Take 0.5 tablets (12.5 mg total) by mouth daily. 06/14/18   Rai, Ripudeep K, MD  mometasone-formoterol (DULERA) 100-5 MCG/ACT AERO Inhale 2 puffs into the lungs 2 (two) times daily. 06/14/18   Rai, Vernelle Emerald, MD  niacin (SLO-NIACIN) 500 MG tablet Take 3 tablets (1,500 mg total) by mouth at bedtime. 09/16/16   Angiulli, Lavon Paganini, PA-C  nitroGLYCERIN (NITROSTAT) 0.4 MG SL tablet Place 1 tablet (0.4 mg total) under the tongue every 5 (five) minutes as needed for chest pain. 09/16/16   Angiulli, Lavon Paganini, PA-C  nystatin (MYCOSTATIN) 100000 UNIT/ML suspension Take 5 mLs (500,000  Units total) by mouth 4 (four) times daily. 06/15/18   Rai, Vernelle Emerald, MD  predniSONE (DELTASONE) 20 MG tablet Take 2 tablets (40 mg total) by mouth daily with breakfast  for 3 days. 06/14/18 06/17/18  Rai, Vernelle Emerald, MD  rosuvastatin (CRESTOR) 20 MG tablet Take 1 tablet (20 mg total) by mouth daily. Patient taking differently: Take 20 mg by mouth at bedtime.  01/05/18   [name], MD  tamsulosin (FLOMAX) 0.4 MG CAPS capsule TAKE ONE CAPSULE BY MOUTH TWICE A DAY. Patient taking differently: Take 0.4 mg by mouth every morning.  09/07/17   [name], MD  warfarin (COUMADIN) 5 MG tablet TAKE ONE TABLET ON MONDAY WEDNESDAY AND SATURDAY, TAKE ONE AND ONE-HALF ON SUNDAY TUESDAY [place] Patient taking differently: Take 5 mg by mouth See admin instructions.  04/19/18   [name], MD    Family History Family History  Problem Relation Age of Onset  . Hypertension Mother   . Stroke Mother   . Heart disease Father   . Heart attack Father   . Diabetes Sister   . Cancer Brother 39       Lung  . Diabetes Sister     Social History Social History   Tobacco Use  . Smoking status: Former Smoker    Types: Cigarettes    Last attempt to quit: 06/20/1992    Years since quitting: 26.0  . Smokeless tobacco: Never Used  Substance Use Topics  . Alcohol use: No    Alcohol/week: 0.0 standard drinks  . Drug use: No     Allergies   Neurontin [gabapentin]; Imitrex [sumatriptan]; and Lipitor [atorvastatin]   Review of Systems Review of Systems  Unable to perform ROS: Dementia  Constitutional: Negative for fever.  HENT: Negative for sore throat.   Eyes: Negative for visual disturbance.  Respiratory: Positive for shortness of breath. Negative for cough, hemoptysis and sputum production.   Cardiovascular: Negative for chest pain and syncope.  Gastrointestinal: Negative for abdominal pain.  Genitourinary: Negative for dysuria.  Musculoskeletal: Negative for neck pain.  Skin:  Negative for rash.  Neurological: Negative for headaches.     Physical Exam Updated Vital Signs BP (!) 84/50 (BP Location: Left Arm)   Pulse 88   Resp 19   SpO2 (!) 68%   Physical Exam Vitals signs and nursing note reviewed.  Constitutional:      Appearance: He is cachectic.  HENT:     Head: Normocephalic and atraumatic.  Eyes:     Conjunctiva/sclera: Conjunctivae normal.  Neck:     Musculoskeletal: Normal range of motion and neck supple.  Cardiovascular:     Rate and Rhythm: Normal rate and regular rhythm.     Heart sounds: No murmur.  Pulmonary:     Effort: Tachypnea and accessory muscle usage present. No respiratory distress.     Breath sounds: Decreased breath sounds present.  Abdominal:     Palpations: Abdomen is soft. There is no mass.     Tenderness: There is no abdominal tenderness.  Musculoskeletal: Normal range of motion.     Right lower leg: He exhibits no tenderness. No edema.     Left lower leg: He exhibits no tenderness. No edema.     Comments: He has diffuse bruising over his upper extremities.  Skin:    General: Skin is warm and dry.     Capillary Refill: Capillary refill takes less than 2 seconds.  Neurological:     General: No focal deficit present.     Mental Status: He is alert. He is disoriented.     GCS: GCS eye subscore is 4. GCS verbal subscore is 5. GCS motor subscore  is 6.      ED Treatments / Results  Labs (all labs ordered are listed, but only abnormal results are displayed) Labs Reviewed  BASIC METABOLIC PANEL - Abnormal; Notable for the following components:      Result Value   Sodium 120 (*)    Chloride 84 (*)    Glucose, Bld 105 (*)    BUN 31 (*)    Calcium 8.5 (*)    All other components within normal limits  CBC WITH DIFFERENTIAL/PLATELET - Abnormal; Notable for the following components:   WBC 16.9 (*)    Platelets 118 (*)    Neutro Abs 15.2 (*)    Abs Immature Granulocytes 0.23 (*)    All other components within normal  limits  TROPONIN I - Abnormal; Notable for the following components:   Troponin I 0.26 (*)    All other components within normal limits  BRAIN NATRIURETIC PEPTIDE - Abnormal; Notable for the following components:   B Natriuretic Peptide 797.0 (*)    All other components within normal limits  PROTIME-INR - Abnormal; Notable for the following components:   Prothrombin Time 23.7 (*)    All other components within normal limits  URINALYSIS, ROUTINE W REFLEX MICROSCOPIC - Abnormal; Notable for the following components:   Hgb urine dipstick SMALL (*)    Ketones, ur 5 (*)    Bacteria, UA RARE (*)    All other components within normal limits  COMPREHENSIVE METABOLIC PANEL - Abnormal; Notable for the following components:   Sodium 124 (*)    Chloride 90 (*)    CO2 21 (*)    BUN 36 (*)    Calcium 7.9 (*)    Total Protein 5.4 (*)    Albumin 3.0 (*)    Total Bilirubin 2.0 (*)    All other components within normal limits  CBC - Abnormal; Notable for the following components:   WBC 10.8 (*)    Platelets 114 (*)    All other components within normal limits  PROTIME-INR - Abnormal; Notable for the following components:   Prothrombin Time 30.6 (*)    All other components within normal limits  URINE CULTURE  LACTIC ACID, PLASMA    EKG EKG Interpretation  Date/Time:  Sunday June 17 2018 15:50:22 EST Ventricular Rate:  90 PR Interval:    QRS Duration: 139 QT Interval:  422 QTC Calculation: 517 R Axis:   -75 Text Interpretation:  Sinus rhythm Short PR interval RBBB and LAFB Abnormal T, consider ischemia, lateral leads ST elevation, consider inferior injury Baseline wander in lead(s) II III aVF similar to prior 12/19 Confirmed by Aletta Edouard [phone]) on 06/17/2018 4:03:03 PM   Radiology Dg Chest Port 1 View  Result Date: 06/17/2018 CLINICAL DATA:  SOB, PER ER NOTE, Pt called out due to SOB. When EMS arrived, O2 sat was 81% on 6L Home oxygen. RA was 60%. On nonrebreathing now  94%. History of COPD and lung cancer. BP 96/61 HISTORY OF CANCER, HTN EXAM: PORTABLE CHEST 1 VIEW COMPARISON:  CT and plain film on 06/07/2018, and earlier CT exam 05/08/2018 FINDINGS: Status post median sternotomy. The heart is enlarged. There is atherosclerotic calcification of the thoracic aorta. Stable appearance of RIGHT perihilar opacity. No new edema or consolidation. IMPRESSION: Stable appearance of the chest. Electronically Signed   By: Nolon Nations M.D.   On: 06/17/2018 16:48    Procedures .Critical Care Performed by: Hayden Rasmussen, MD Authorized by: Hayden Rasmussen, MD  Critical care provider statement:    Critical care time (minutes):  45   Critical care was necessary to treat or prevent imminent or life-threatening deterioration of the following conditions:  Respiratory failure and metabolic crisis   Critical care was time spent personally by me on the following activities:  Discussions with consultants, evaluation of patient's response to treatment, examination of patient, ordering and performing treatments and interventions, ordering and review of laboratory studies, ordering and review of radiographic studies, pulse oximetry, re-evaluation of patient's condition, obtaining history from patient or surrogate, review of old charts and development of treatment plan with patient or surrogate   I assumed direction of critical care for this patient from another provider in my specialty: no     (including critical care time)  Medications Ordered in ED Medications  acetaminophen (TYLENOL) tablet 325-650 mg (has no administration in time range)  nitroGLYCERIN (NITROSTAT) SL tablet 0.4 mg (has no administration in time range)  levothyroxine (SYNTHROID, LEVOTHROID) tablet 112 mcg (112 mcg Oral Given 06/18/18 0617)  linaclotide (LINZESS) capsule 145 mcg (145 mcg Oral Given 06/18/18 0816)  tamsulosin (FLOMAX) capsule 0.4 mg (0.4 mg Oral Not Given 06/18/18 1146)  albuterol  (PROVENTIL) (2.5 MG/3ML) 0.083% nebulizer solution 3 mL (has no administration in time range)  mometasone-formoterol (DULERA) 100-5 MCG/ACT inhaler 2 puff (2 puffs Inhalation Given 06/18/18 1026)  nystatin (MYCOSTATIN) 100000 UNIT/ML suspension 500,000 Units (500,000 Units Oral Given 06/18/18 1036)  traZODone (DESYREL) tablet 50 mg (50 mg Oral Given 06/18/18 0030)  ondansetron (ZOFRAN) tablet 4 mg (has no administration in time range)    Or  ondansetron (ZOFRAN) injection 4 mg (has no administration in time range)  feeding supplement (ENSURE ENLIVE) (ENSURE ENLIVE) liquid 237 mL (237 mLs Oral Given 06/18/18 1039)  midodrine (PROAMATINE) tablet 10 mg (10 mg Oral Given 06/18/18 1150)  methylPREDNISolone sodium succinate (SOLU-MEDROL) 125 mg/2 mL injection 60 mg (60 mg Intravenous Given 06/18/18 1151)  ipratropium-albuterol (DUONEB) 0.5-2.5 (3) MG/3ML nebulizer solution 3 mL (3 mLs Nebulization Given 06/18/18 1033)  morphine CONCENTRATE 10 MG/0.5ML oral solution 2.6-5 mg (has no administration in time range)  polyvinyl alcohol (LIQUIFILM TEARS) 1.4 % ophthalmic solution 2 drop (has no administration in time range)  bisacodyl (DULCOLAX) suppository 10 mg (has no administration in time range)  sodium chloride 0.9 % bolus 250 mL (0 mLs Intravenous Stopped 06/17/18 1831)  albuterol (PROVENTIL) (2.5 MG/3ML) 0.083% nebulizer solution 2.5 mg (2.5 mg Nebulization Given 06/17/18 1701)  warfarin (COUMADIN) tablet 7.5 mg (7.5 mg Oral Given 06/18/18 0013)  sodium chloride 0.9 % bolus 250 mL (250 mLs Intravenous Transfusing/Transfer 06/17/18 2208)  sodium chloride 0.9 % bolus 500 mL (500 mLs Intravenous Bolus from Bag 06/18/18 0304)  sodium chloride 0.9 % bolus 500 mL (500 mLs Intravenous New Bag/Given 06/18/18 0608)     Initial Impression / Assessment and Plan / ED Course  I have reviewed the triage vital signs and the nursing notes.  Pertinent labs & imaging results that were available during my care of  the patient were reviewed by me and considered in my medical decision making (see chart for details).  Clinical Course as of Jun 18 1321  Sun Jun 17, 2018  1606 Patient with known history of COPD CAD lung cancer who was just discharged 2 days ago for COPD CHF exacerbation with end STEMI.  It sounds like he was being transferred from the bathroom back to a chair when family checked his sats and found to be low.   [  MB]  1621 It sounds like during his last admission they diuresed him a lot.  Currently his blood pressure is 80/50.  We have re-adjusted the pulse ox and his sats are 99% on nasal cannula.   [MB]  1622 It looks like his baseline blood pressures are more like 110.  We will give him a 250 cc bolus.   [MB]  1701 Wife is here now and was able to explain what happened at home.  It sounds like any kind of moving around he does he feels more short of breath and lightheaded.  She said he is only been home for a day.  They have the visiting nurse check on him yesterday.  When he had more trouble today and they could not get his oxygen back up they called the visiting nurse but were unable to reach them and so they called the ambulance.  She is wondering if he left the hospital too soon.  She is not sure she can care for him in the condition he is in right now.   [MB]  0254 Currently on his discharge oxygen level of 5 L nasal cannula.  His troponin is elevated at 0.26 but that is lower than his discharge troponin.  His BNP is elevated but a little better than discharge.  His sodium is now 120.  Will page hospitalist for admission.   [MB]    Clinical Course User Index [MB] Hayden Rasmussen, MD      Final Clinical Impressions(s) / ED Diagnoses   Final diagnoses:  SOB (shortness of breath)  Hyponatremia  Hypotension, unspecified hypotension type  Malignant neoplasm of lung, unspecified laterality, unspecified part of lung ([place])  Elevated troponin    ED Discharge Orders    None         Hayden Rasmussen, MD 06/18/18 1323

## 2018-06-17 NOTE — ED Notes (Signed)
Admitting MD at bedside.

## 2018-06-17 NOTE — ED Notes (Signed)
EDP at bedside  

## 2018-06-17 NOTE — ED Notes (Signed)
Spoke with Dr Theador Hawthorne regarding pt's dispo set to ICU. Dr Theador Hawthorne states that he continues to want pt placed in ICU d/t hypotension and hyponatremia. States he needs IV fluids and may end up on pressors. Dr Theador Hawthorne states pt is a DNR but family has not agreed on comfort care and thus still want him treated. Barnett Applebaum, [facility] made aware of this.

## 2018-06-17 NOTE — Progress Notes (Signed)
Peakflow 135mL.

## 2018-06-17 NOTE — ED Triage Notes (Signed)
Pt called out due to SOB. When EMS arrived, O2 sat was 81% on 6L Home oxygen. RA was 60%. On nonrebreather now 94%. History of COPD and lung cancer. BP 96/61

## 2018-06-17 NOTE — ED Notes (Signed)
RT at bedside. O2sats up to 92% on 6L.

## 2018-06-17 NOTE — ED Notes (Signed)
Have paged respiratory

## 2018-06-17 NOTE — H&P (Addendum)
History and Physical    [name] [name] HUT:[id number] DOB: 25-Dec-1940 DOA: 06/17/2018  PCP: [name], MD  Patient coming from: Home  I have personally briefly reviewed patient's old medical records in [place]  Chief Complaint: Shortness of breath History mostly given by patient's spouse.  HPI: [name] [name] is a 77 y.o. male with medical history significant of coronary artery disease, adenocarcinoma of right lung who came to the ER with chief complaint of shortness of breath.  History of present illness dates back to December 19 when patient was admitted with chief complaint of an NST elevation MI and severe shortness of breath . Patient was discharged home on December 27 with 5 liters oxygen.  Patient stayed home for December 27 night and from December 28 patient started feeling short of breath.  Patient family was checking his pulse ox and patient pulse ox was dropping so the patient family brought him to the hospital. No Complain of chest pain. Patient spouse does give history of having cough.   She also went on to explain that sometimes he has hemoptysis because of his lung cancer. Pt spouse said "he has decreased appetite and decreased energy" She might also explain that he had burning during his micturition earlier now he does not. NO c/o Of abdominal pain No Complaint of hematuria No hx  of any sick contacts No complain of any upper respiratory symptoms.   ED Course: Pt was kept on oxygen and CXR was done. Hospitalist team was called in for [place]: As per HPI otherwise 10 point review of systems negative.    Past Medical History:  Diagnosis Date  . Adenocarcinoma of right lung, stage 3 ([place]) 09/20/2016  . Atrial fibrillation ([place])    no hx of reported at preop visit of 04/21/11   . CAD (coronary artery disease)   . Carotid artery occlusion    left carotid endarterectomy  . Chronic kidney disease    AAA repair with reimplant of  renals   . CHRONIC OBSTRUCTIVE PULMONARY DISEASE    pt denied at visit of 04/21/11   . CORONARY ARTERY DISEASE   . Dehydration 11/15/2016  . Disorder of left sacroiliac joint 08/24/2016  . DIVERTICULOSIS OF COLON   . Encounter for antineoplastic chemotherapy 10/17/2016  . GERD   . H/O hiatal hernia   . Headache(784.0)    Hx: of years of years  . HYPERTENSION   . HYPOTHYROIDISM   . JOINT EFFUSION, KNEE   . KNEE, ARTHRITIS, DEGEN./OSTEO    right knee   . Myocardial infarction ([place])    1994   . NEPHROLITHIASIS, HX OF   . OSTEOPOROSIS   . Other dysphagia   . PERIPHERAL VASCULAR DISEASE    AAA - 1994 with reimplant of renals   . Peripheral vascular disease ([place])    subclavian stenosis PTA - 3/08   . Pure hypercholesterolemia   . Renal artery stenosis [facility])     Past Surgical History:  Procedure Laterality Date  . ABDOMINAL AORTIC ANEURYSM REPAIR     with reimplantation of renals   . CARDIAC CATHETERIZATION     2008  . CAROTID ENDARTERECTOMY Left Jan. 30, 2015   CEA  . CHOLECYSTECTOMY  2000   Gall Bladder  . COLONOSCOPY W/ BIOPSIES AND POLYPECTOMY     Hx: of  . CORONARY ARTERY BYPASS GRAFT     1995  . CORONARY STENT PLACEMENT     Hx: of  .  ENDARTERECTOMY Left 07/19/2013   Procedure: ENDARTERECTOMY CAROTID;  Surgeon: Mal Misty, MD;  Location: [place];  Service: Vascular;  Laterality: Left;  . GALLBLADDER SURGERY  2004   . JOINT REPLACEMENT     partial knee replacement on left 2002   . KNEE SURGERY    . ORIF PELVIC FRACTURE Left 08/25/2016   Procedure: OPEN REDUCTION INTERNAL FIXATION (ORIF) PELVIC FRACTURE; plate on front, SI screw on the back;  Surgeon: Altamese Hersey, MD;  Location: [place];  Service: Orthopedics;  Laterality: Left;  . OTHER SURGICAL HISTORY     left subclavian stenosis surgery PTA 08/2006   . OTHER SURGICAL HISTORY     carotid surgery on right 2004   . RIGHT/LEFT HEART CATH AND CORONARY/GRAFT ANGIOGRAPHY [name] 06/12/2018   Procedure: RIGHT/LEFT HEART CATH  AND CORONARY/GRAFT ANGIOGRAPHY;  Surgeon: Burnell Blanks, MD;  Location: [place] CV LAB;  Service: Cardiovascular;  Laterality: [name];  . TOTAL KNEE ARTHROPLASTY  04/29/2011   Procedure: TOTAL KNEE ARTHROPLASTY;  Surgeon: Gearlean Alf;  Location: [facility];  Service: Orthopedics;  Laterality: Right;  . urinary retention    . VASCULAR SURGERY     AAA  . VASECTOMY  1973  . VIDEO BRONCHOSCOPY WITH ENDOBRONCHIAL ULTRASOUND [name] 09/09/2016   Procedure: VIDEO BRONCHOSCOPY WITH ENDOBRONCHIAL ULTRASOUND;  Surgeon: Grace Isaac, MD;  Location: [place];  Service: Thoracic;  Laterality: [name];    Social History:  reports that he quit smoking about 26 years ago. His smoking use included cigarettes. He has never used smokeless tobacco. He reports that he does not drink alcohol or use drugs.  Allergies  Allergen Reactions  . Neurontin [Gabapentin] Other (See Comments)    Caused confusion  . Imitrex [Sumatriptan] Nausea And Vomiting and Other (See Comments)    "made me like I was having a stoke"  . Lipitor [Atorvastatin] Nausea And Vomiting and Other (See Comments)    INTOLERANCE > MYALGIAS "couldn't get out of the bed ( pt had to crawl out of bed ) I hurt so bad"    Family History  Problem Relation Age of Onset  . Hypertension Mother   . Stroke Mother   . Heart disease Father   . Heart attack Father   . Diabetes Sister   . Cancer Brother 51       Lung  . Diabetes Sister      Prior to Admission medications   Medication Sig Start Date End Date Taking? Authorizing Provider  acetaminophen (TYLENOL) 325 MG tablet Take 325-650 mg by mouth daily as needed for headache (for pain).    Yes [provider]  albuterol (PROVENTIL HFA;VENTOLIN HFA) 108 (90 Base) MCG/ACT inhaler Inhale 2 puffs into the lungs every 6 (six) hours as needed for wheezing or shortness of breath. 06/14/18  Yes Rai, Ripudeep K, MD  aspirin EC 81 MG EC tablet Take 1 tablet (81 mg total) by mouth daily.  06/14/18  Yes Rai, Ripudeep K, MD  bumetanide (BUMEX) 1 MG tablet Take 1 tablet (1 mg total) by mouth daily. 06/15/18  Yes Rai, Ripudeep K, MD  donepezil (ARICEPT) 10 MG tablet Take 1 tablet daily Patient taking differently: Take 10 mg by mouth at bedtime.  04/03/18  Yes Cameron Sprang, MD  lansoprazole (PREVACID) 15 MG capsule Take 1 capsule (15 mg total) by mouth daily. Patient taking differently: Take 15 mg by mouth every morning.  09/16/16  Yes Angiulli, Lavon Paganini, PA-C  levothyroxine (SYNTHROID, LEVOTHROID) 112  MCG tablet Take 1 tablet (112 mcg total) by mouth daily. Patient taking differently: Take 112 mcg by mouth every morning.  08/17/17  Yes [name], MD  linaclotide [facility]) 145 MCG CAPS capsule Take 1 capsule (145 mcg total) by mouth daily before breakfast. 08/17/17  Yes [name], MD  lisinopril (PRINIVIL,ZESTRIL) 2.5 MG tablet Take 1 tablet (2.5 mg total) by mouth daily. 06/14/18  Yes Rai, Ripudeep K, MD  metoprolol succinate (TOPROL-XL) 25 MG 24 hr tablet Take 0.5 tablets (12.5 mg total) by mouth daily. 06/14/18  Yes Rai, Ripudeep K, MD  mometasone-formoterol (DULERA) 100-5 MCG/ACT AERO Inhale 2 puffs into the lungs 2 (two) times daily. 06/14/18  Yes Rai, Ripudeep K, MD  niacin (SLO-NIACIN) 500 MG tablet Take 3 tablets (1,500 mg total) by mouth at bedtime. 09/16/16  Yes Angiulli, Lavon Paganini, PA-C  nitroGLYCERIN (NITROSTAT) 0.4 MG SL tablet Place 1 tablet (0.4 mg total) under the tongue every 5 (five) minutes as needed for chest pain. 09/16/16  Yes Angiulli, Lavon Paganini, PA-C  nystatin (MYCOSTATIN) 100000 UNIT/ML suspension Take 5 mLs (500,000 Units total) by mouth 4 (four) times daily. 06/15/18  Yes Rai, Ripudeep K, MD  predniSONE (DELTASONE) 20 MG tablet Take 2 tablets (40 mg total) by mouth daily with breakfast for 3 days. 06/14/18 06/17/18 Yes Rai, Ripudeep K, MD  rosuvastatin (CRESTOR) 20 MG tablet Take 1 tablet (20 mg total) by mouth daily. Patient taking differently: Take  20 mg by mouth at bedtime.  01/05/18  Yes [name], MD  tamsulosin (FLOMAX) 0.4 MG CAPS capsule TAKE ONE CAPSULE BY MOUTH TWICE A DAY. Patient taking differently: Take 0.4 mg by mouth daily.  09/07/17  Yes [name], MD  warfarin (COUMADIN) 5 MG tablet TAKE ONE TABLET ON MONDAY WEDNESDAY AND SATURDAY, TAKE ONE AND ONE-HALF ON SUNDAY TUESDAY [place] Patient taking differently: Take 5 mg by mouth See admin instructions.  04/19/18  Yes [name], MD    Physical Exam: Vitals:   06/17/18 1600 06/17/18 1655 06/17/18 1732 06/17/18 1800  BP: (!) 83/52 (!) 85/48 (!) 85/52 (!) 81/53  Pulse: 82 83 86 87  Resp: (!) 22 17 (!) 31 20  SpO2: 95% 100% 100% 100%    Constitutional: NAD, calm, comfortable Eyes: PERRL, lids and conjunctivae normal ENMT: Mucous membranes are moist. Posterior pharynx clear of any exudate or lesions.Normal dentition.  Neck: normal, supple, no masses, no thyromegaly Respiratory: clear to auscultation bilaterally, no wheezing, no crackles. Normal respiratory effort. No accessory muscle use.  Cardiovascular: Regular rate and rhythm, no murmurs / rubs / gallops. No extremity edema. 2+ pedal pulses. No carotid bruits.  Abdomen: no tenderness, no masses palpated. No hepatosplenomegaly. Bowel sounds positive.  Musculoskeletal: no clubbing / cyanosis. No joint deformity upper and lower extremities. Good ROM, no contractures. Normal muscle tone.  Skin: no rashes, lesions, ulcers. No induration Neurologic: CN 2-12 grossly intact. Sensation intact, DTR normal    Labs on Admission: I have personally reviewed following labs and imaging studies  CBC: Recent Labs  Lab 06/12/18 0259 06/13/18 0327 06/14/18 0258 06/15/18 0223 06/17/18 1615  WBC 10.2 10.6* 12.9* 12.7* 16.9*  NEUTROABS  --   --   --   --  15.2*  HGB 15.3 14.6 14.7 13.7 15.1  HCT 43.5 41.4 41.3 39.1 44.1  MCV 87.2 87.7 86.6 86.3 86.6  PLT 133* 125* 122* 131* 244*   Basic Metabolic  Panel: Recent Labs  Lab 06/11/18 0544  06/12/18  4270 06/13/18 0327 06/14/18 6237 06/15/18 0914 06/17/18 1615  NA 130*   < > 130* 129* 128* 128* 120*  K 4.5   < > 4.7 4.6 4.7 4.2 3.8  CL 96*   < > 94* 97* 96* 93* 84*  CO2 23   < > 26 21* 21* 25 22  GLUCOSE 131*   < > 124* 128* 129* 101* 105*  BUN 31*   < > 31* 28* 30* 34* 31*  CREATININE 0.91   < > 0.97 0.77 0.94 0.97 0.93  CALCIUM 9.1   < > 9.0 8.4* 8.2* 8.6* 8.5*  MG 1.9  --   --   --   --   --   --    < > = values in this interval not displayed.   GFR: Estimated Creatinine Clearance: 51 mL/min (by C-G formula based on SCr of 0.93 mg/dL). Liver Function Tests: No results for input(s): AST, ALT, ALKPHOS, BILITOT, PROT, ALBUMIN in the last 168 hours. No results for input(s): LIPASE, AMYLASE in the last 168 hours. No results for input(s): AMMONIA in the last 168 hours. Coagulation Profile: Recent Labs  Lab 06/12/18 0259 06/13/18 0804 06/14/18 0633 06/15/18 0223 06/17/18 1615  INR 1.73 1.62 1.52 1.60 2.14   Cardiac Enzymes: Recent Labs  Lab 06/17/18 1615  TROPONINI 0.26*    Urine analysis:    Component Value Date/Time   COLORURINE AMBER (A) 02/08/2017 0955   APPEARANCEUR HAZY (A) 02/08/2017 0955   LABSPEC 1.023 02/08/2017 0955   PHURINE 5.0 02/08/2017 0955   GLUCOSEU NEGATIVE 02/08/2017 0955   HGBUR LARGE (A) 02/08/2017 0955   BILIRUBINUR NEGATIVE 02/08/2017 0955   KETONESUR NEGATIVE 02/08/2017 0955   PROTEINUR 100 (A) 02/08/2017 0955   UROBILINOGEN 1.0 07/17/2013 1201   NITRITE POSITIVE (A) 02/08/2017 0955   LEUKOCYTESUR NEGATIVE 02/08/2017 0955    Radiological Exams on Admission: Dg Chest Port 1 View  Result Date: 06/17/2018 CLINICAL DATA:  SOB, PER ER NOTE, Pt called out due to SOB. When EMS arrived, O2 sat was 81% on 6L Home oxygen. RA was 60%. On nonrebreathing now 94%. History of COPD and lung cancer. BP 96/61 HISTORY OF CANCER, HTN EXAM: PORTABLE CHEST 1 VIEW COMPARISON:  CT and plain film on  06/07/2018, and earlier CT exam 05/08/2018 FINDINGS: Status post median sternotomy. The heart is enlarged. There is atherosclerotic calcification of the thoracic aorta. Stable appearance of RIGHT perihilar opacity. No new edema or consolidation. IMPRESSION: Stable appearance of the chest. Electronically Signed   By: Nolon Nations M.D.   On: 06/17/2018 16:48    EKG: Independently reviewed.  Assessment/Plan Active Problems:   Coronary atherosclerosis   Pt recently admitted with NSTEMI   S/p cath   Trop high but trending lower than last admission    No c/o chest pain    Trops can stay elevated for up to 14 days    No need for heparin    Will continue home medications   Pt does have hx of   CORONARY ARTERY BYPASS GRAFT.     ATRIAL FIBRILLATION    Will continue anticoagulation    COPD with acute exacerbation (HCC)    Stable       AAA (abdominal aortic aneurysm) (HCC)      stable    Hyponatremia     Multiple etiology     Hypovolemic hyponatremia     Some component of SIADH from lung issues     Will keep in OVF  Confusion     Metabolic encephalopathy    Dementia, neurological (HCC)     Stable    Ischemic cardiomyopathy    Pt currently hypovolemic, not in fluid overload    Hypotension     Hypovolemic sec to diuresis/poor po intake at home     Will hold ace/metoprolol and diuretics     Will give IVF at slow rate sec to hx of CHF        Thrombocytopenia (HCC)    Stable    SIRS (systemic inflammatory response syndrome) (HCC)     High WBC + Low BP     NO overt signs of infection      Low threshold for antibiotics      Will pan culture   Urology Pt self caths at home      Will continue intermittent cath      Social-Pt has poor prognosis. I had extensive discussion with pt and family. Pt spouse informed me that he is DNR ands they have paperwork at home. They will bring in am   DVT prophylaxis: already on Coumadin Code Status: DNR Family Communication: Pt  family- spouse, daughter and two sons present in the room  Disposition Plan: home Consults called: Palliative consult placed Admission status: obs  Liana Gerold MD [facility] Pager [phone]  If 7PM-7AM, please contact night-coverage [email or web address] Password [facility]  06/17/2018, 7:17 PM

## 2018-06-17 NOTE — ED Notes (Signed)
CRITICAL VALUE ALERT  Critical Value: Troponin 0.26  Date & Time Notied:  06/17/18 1706  Provider Notified: Dr. Melina Copa   Orders Received/Actions taken: None yet

## 2018-06-18 ENCOUNTER — Encounter (HOSPITAL_COMMUNITY): Payer: Self-pay | Admitting: Primary Care

## 2018-06-18 ENCOUNTER — Telehealth: Payer: Self-pay | Admitting: Family Medicine

## 2018-06-18 DIAGNOSIS — K219 Gastro-esophageal reflux disease without esophagitis: Secondary | ICD-10-CM | POA: Diagnosis not present

## 2018-06-18 DIAGNOSIS — I214 Non-ST elevation (NSTEMI) myocardial infarction: Secondary | ICD-10-CM | POA: Diagnosis present

## 2018-06-18 DIAGNOSIS — R627 Adult failure to thrive: Secondary | ICD-10-CM | POA: Diagnosis present

## 2018-06-18 DIAGNOSIS — C3490 Malignant neoplasm of unspecified part of unspecified bronchus or lung: Secondary | ICD-10-CM | POA: Diagnosis not present

## 2018-06-18 DIAGNOSIS — I10 Essential (primary) hypertension: Secondary | ICD-10-CM | POA: Diagnosis not present

## 2018-06-18 DIAGNOSIS — N189 Chronic kidney disease, unspecified: Secondary | ICD-10-CM | POA: Diagnosis not present

## 2018-06-18 DIAGNOSIS — R0602 Shortness of breath: Secondary | ICD-10-CM | POA: Diagnosis present

## 2018-06-18 DIAGNOSIS — I480 Paroxysmal atrial fibrillation: Secondary | ICD-10-CM | POA: Diagnosis not present

## 2018-06-18 DIAGNOSIS — I959 Hypotension, unspecified: Secondary | ICD-10-CM | POA: Diagnosis not present

## 2018-06-18 DIAGNOSIS — G988 Other disorders of nervous system: Secondary | ICD-10-CM

## 2018-06-18 DIAGNOSIS — Z515 Encounter for palliative care: Secondary | ICD-10-CM | POA: Diagnosis not present

## 2018-06-18 DIAGNOSIS — I4891 Unspecified atrial fibrillation: Secondary | ICD-10-CM | POA: Diagnosis not present

## 2018-06-18 DIAGNOSIS — Z7189 Other specified counseling: Secondary | ICD-10-CM | POA: Diagnosis not present

## 2018-06-18 DIAGNOSIS — I714 Abdominal aortic aneurysm, without rupture: Secondary | ICD-10-CM

## 2018-06-18 DIAGNOSIS — I255 Ischemic cardiomyopathy: Secondary | ICD-10-CM | POA: Diagnosis present

## 2018-06-18 DIAGNOSIS — I5022 Chronic systolic (congestive) heart failure: Secondary | ICD-10-CM | POA: Diagnosis present

## 2018-06-18 DIAGNOSIS — F0280 Dementia in other diseases classified elsewhere without behavioral disturbance: Secondary | ICD-10-CM

## 2018-06-18 DIAGNOSIS — J441 Chronic obstructive pulmonary disease with (acute) exacerbation: Secondary | ICD-10-CM

## 2018-06-18 DIAGNOSIS — E871 Hypo-osmolality and hyponatremia: Secondary | ICD-10-CM | POA: Diagnosis not present

## 2018-06-18 DIAGNOSIS — E861 Hypovolemia: Secondary | ICD-10-CM | POA: Diagnosis present

## 2018-06-18 DIAGNOSIS — Z66 Do not resuscitate: Secondary | ICD-10-CM | POA: Diagnosis present

## 2018-06-18 DIAGNOSIS — M810 Age-related osteoporosis without current pathological fracture: Secondary | ICD-10-CM | POA: Diagnosis present

## 2018-06-18 DIAGNOSIS — G9341 Metabolic encephalopathy: Secondary | ICD-10-CM | POA: Diagnosis present

## 2018-06-18 DIAGNOSIS — Z9981 Dependence on supplemental oxygen: Secondary | ICD-10-CM | POA: Diagnosis not present

## 2018-06-18 DIAGNOSIS — R6510 Systemic inflammatory response syndrome (SIRS) of non-infectious origin without acute organ dysfunction: Secondary | ICD-10-CM | POA: Diagnosis present

## 2018-06-18 DIAGNOSIS — E039 Hypothyroidism, unspecified: Secondary | ICD-10-CM | POA: Diagnosis not present

## 2018-06-18 DIAGNOSIS — D696 Thrombocytopenia, unspecified: Secondary | ICD-10-CM

## 2018-06-18 DIAGNOSIS — K314 Gastric diverticulum: Secondary | ICD-10-CM | POA: Diagnosis not present

## 2018-06-18 DIAGNOSIS — I110 Hypertensive heart disease with heart failure: Secondary | ICD-10-CM | POA: Diagnosis present

## 2018-06-18 DIAGNOSIS — J9601 Acute respiratory failure with hypoxia: Secondary | ICD-10-CM | POA: Diagnosis present

## 2018-06-18 DIAGNOSIS — I2510 Atherosclerotic heart disease of native coronary artery without angina pectoris: Secondary | ICD-10-CM | POA: Diagnosis not present

## 2018-06-18 DIAGNOSIS — C3491 Malignant neoplasm of unspecified part of right bronchus or lung: Secondary | ICD-10-CM | POA: Diagnosis present

## 2018-06-18 DIAGNOSIS — I739 Peripheral vascular disease, unspecified: Secondary | ICD-10-CM | POA: Diagnosis not present

## 2018-06-18 DIAGNOSIS — F0390 Unspecified dementia without behavioral disturbance: Secondary | ICD-10-CM | POA: Diagnosis present

## 2018-06-18 LAB — COMPREHENSIVE METABOLIC PANEL
ALT: 38 U/L (ref 0–44)
AST: 39 U/L (ref 15–41)
Albumin: 3 g/dL — ABNORMAL LOW (ref 3.5–5.0)
Alkaline Phosphatase: 74 U/L (ref 38–126)
Anion gap: 13 (ref 5–15)
BUN: 36 mg/dL — ABNORMAL HIGH (ref 8–23)
CO2: 21 mmol/L — ABNORMAL LOW (ref 22–32)
Calcium: 7.9 mg/dL — ABNORMAL LOW (ref 8.9–10.3)
Chloride: 90 mmol/L — ABNORMAL LOW (ref 98–111)
Creatinine, Ser: 0.88 mg/dL (ref 0.61–1.24)
GFR calc Af Amer: >60 mL/min (ref >60)
GFR calc non Af Amer: >60 mL/min (ref >60)
Glucose, Bld: 87 mg/dL (ref 70–99)
Potassium: 3.5 mmol/L (ref 3.5–5.1)
Sodium: 124 mmol/L — ABNORMAL LOW (ref 135–145)
Total Bilirubin: 2 mg/dL — ABNORMAL HIGH (ref 0.3–1.2)
Total Protein: 5.4 g/dL — ABNORMAL LOW (ref 6.5–8.1)

## 2018-06-18 LAB — PROTIME-INR
INR: 2.99
Prothrombin Time: 30.6 seconds — ABNORMAL HIGH (ref 11.4–15.2)

## 2018-06-18 LAB — CBC
HCT: 39 % (ref 39.0–52.0)
Hemoglobin: 13.6 g/dL (ref 13.0–17.0)
MCH: 30.4 pg (ref 26.0–34.0)
MCHC: 34.9 g/dL (ref 30.0–36.0)
MCV: 87.1 fL (ref 80.0–100.0)
Platelets: 114 10*3/uL — ABNORMAL LOW (ref 150–400)
RBC: 4.48 MIL/uL (ref 4.22–5.81)
RDW: 15.5 % (ref 11.5–15.5)
WBC: 10.8 10*3/uL — ABNORMAL HIGH (ref 4.0–10.5)
nRBC: 0 % (ref 0.0–0.2)

## 2018-06-18 MED ORDER — MORPHINE SULFATE (CONCENTRATE) 10 MG/0.5ML PO SOLN
2.5000 mg | ORAL | Status: DC | PRN
  Administered 2018-06-18: 2.6 mg via ORAL
  Administered 2018-06-19 (×2): 5 mg via ORAL
  Filled 2018-06-18 (×3): qty 0.5

## 2018-06-18 MED ORDER — METHYLPREDNISOLONE SODIUM SUCC 125 MG IJ SOLR
60.0000 mg | Freq: Four times a day (QID) | INTRAMUSCULAR | Status: DC
  Administered 2018-06-18 – 2018-06-19 (×4): 60 mg via INTRAVENOUS
  Filled 2018-06-18 (×4): qty 2

## 2018-06-18 MED ORDER — SODIUM CHLORIDE 0.9 % IV BOLUS
500.0000 mL | Freq: Once | INTRAVENOUS | Status: AC
  Administered 2018-06-18: 500 mL via INTRAVENOUS

## 2018-06-18 MED ORDER — ENSURE ENLIVE PO LIQD
237.0000 mL | Freq: Two times a day (BID) | ORAL | Status: DC
  Administered 2018-06-18 – 2018-06-19 (×4): 237 mL via ORAL

## 2018-06-18 MED ORDER — BISACODYL 10 MG RE SUPP: 10.0000 mg | Freq: Every day | RECTAL | Status: DC | PRN

## 2018-06-18 MED ORDER — PANTOPRAZOLE SODIUM 40 MG PO TBEC
40.0000 mg | DELAYED_RELEASE_TABLET | Freq: Every day | ORAL | Status: DC
  Administered 2018-06-18: 40 mg via ORAL
  Filled 2018-06-18: qty 1

## 2018-06-18 MED ORDER — FLUCONAZOLE 100MG IVPB
100.0000 mg | INTRAVENOUS | Status: DC
  Filled 2018-06-18 (×2): qty 50

## 2018-06-18 MED ORDER — IPRATROPIUM-ALBUTEROL 0.5-2.5 (3) MG/3ML IN SOLN
3.0000 mL | Freq: Four times a day (QID) | RESPIRATORY_TRACT | Status: DC
  Administered 2018-06-18 – 2018-06-19 (×5): 3 mL via RESPIRATORY_TRACT
  Filled 2018-06-18 (×5): qty 3

## 2018-06-18 MED ORDER — POLYVINYL ALCOHOL 1.4 % OP SOLN: 2.0000 [drp] | OPHTHALMIC | Status: DC | PRN

## 2018-06-18 MED ORDER — MIDODRINE HCL 5 MG PO TABS
10.0000 mg | ORAL_TABLET | Freq: Three times a day (TID) | ORAL | Status: DC
  Administered 2018-06-18 – 2018-06-19 (×3): 10 mg via ORAL
  Filled 2018-06-18 (×3): qty 2

## 2018-06-18 NOTE — Telephone Encounter (Signed)
Please advise 

## 2018-06-18 NOTE — Clinical Social Work Note (Signed)
Referred patient to [facility] per Palliative Care request.

## 2018-06-18 NOTE — Progress Notes (Signed)
PROGRESS NOTE    [name] [name]  HER:[id number] DOB: January 11, 1941 DOA: 06/17/2018 PCP: [name], MD    Brief Narrative:  77 year old male with a history of coronary artery disease, right-sided lung cancer, atrial fibrillation on anticoagulation, COPD, chronic systolic congestive heart failure with ejection fraction of 35%, who was recently discharged from [facility] after being treated for non-ST elevation MI and underwent cardiac catheterization.  He has been declining for the past month.  He came back to the hospital within 2 days of discharge with worsening shortness of breath.  Chest x-ray did not show any acute findings.  He was noted to be hypotensive, hyponatremic due to possible volume depletion.  Was started on IV fluids.  Unfortunately, he remains hypotensive.  Prognosis appears to be poor.  Palliative care has been consulted.   Assessment & Plan:   Active Problems:   Coronary atherosclerosis   ATRIAL FIBRILLATION   COPD with acute exacerbation (HCC)   CORONARY ARTERY BYPASS GRAFT, HX OF   AAA (abdominal aortic aneurysm) (HCC)   PAF (paroxysmal atrial fibrillation) (HCC)   Hyponatremia   Confusion   Dementia, neurological (HCC)   Ischemic cardiomyopathy   Hypotension   Thrombocytopenia (HCC)   SIRS (systemic inflammatory response syndrome) ([place])   1. Hypotension.  Possibly related to hypovolemia as well as low cardiac output.  He does not appear septic at this time.  Lactic acid was normal.  He is not febrile.  He has been receiving IV fluids.  Started on midodrine.  Blood pressure remains in the 70s to 80s range throughout the night. 2. Hyponatremia.  Suspect related to hypovolemia.  Has some component of SIADH as well.  Mild improvement with saline. 3. Severe COPD.  Diminished breath sounds bilaterally.  Start on intravenous steroids, bronchodilators. 4. Acute respiratory failure with hypoxia.  Recently discharged from the hospital on 5 to 6 L of oxygen.   Family reports that he was not on oxygen prior to that hospitalization.  Chest x-ray done on admission shows stable appearance of chest 5. Chronic systolic congestive heart failure.  Ejection fraction of 35 to 40%.  Starting to show signs of volume overload with pedal edema.  Blood pressure will not tolerate diuresis at this time. 6. Paroxysmal atrial fibrillation.  Heart rate is currently stable.  Anticoagulated with Coumadin. 7. CAD status post CABG in the past with recent non-STEMI.  Patient recently underwent cardiac catheterization.  Recommendation for medical management. 8. Dementia.  Continue Aricept. 9. Abdominal aortic aneurysm.  Stable.  For outpatient follow-up. 10. Lung cancer.  He is completed radiation and immunotherapy.  Plans are to follow-up with oncology as an outpatient 11. Failure to thrive/goals of care.  Patient has had poor p.o. intake for the last month.  He has been losing weight.  He is generally weak.  With his multiple medical problems, his overall prognosis is poor.  Family has already agreed to DNR.  They wish to further discuss goals of care and may consider hospice.  Palliative care has been consulted.   DVT prophylaxis: Coumadin Code Status: DNR Family Communication: Discussed with wife at the bedside Disposition Plan: Pending hospital course   Consultants:   Palliative care  Procedures:     Antimicrobials:      Subjective: Patient is somnolent, wakes up to voice.  Says he feels "pretty good"  Objective: Vitals:   06/18/18 0815 06/18/18 0845 06/18/18 0900 06/18/18 0915  BP: (!) 82/44 (!) 76/42 (!) 79/45 (!) 80/43  Pulse:   80 80  Resp:   18 18  Temp:      TempSrc:      SpO2:   100% 100%  Weight:      Height:        Intake/Output Summary (Last 24 hours) at 06/18/2018 1018 Last data filed at 06/18/2018 0500 Gross per 24 hour  Intake 4.58 ml  Output 500 ml  Net -495.42 ml   Filed Weights   06/17/18 2245 06/18/18 0500  Weight: 55.3 kg  55.8 kg    Examination:  General exam: appears chronically ill, somnolent Respiratory system: Diminished breath sounds bilaterally.  Increased respiratory effort Cardiovascular system: S1 & S2 heard, RRR. No JVD, murmurs, rubs, gallops or clicks.  1+ pedal edema. Gastrointestinal system: Abdomen is nondistended, soft and nontender. No organomegaly or masses felt. Normal bowel sounds heard. Central nervous system:  No focal neurological deficits. Extremities: Symmetric Skin: No rashes, lesions or ulcers Psychiatry: Somnolent, confused    Data Reviewed: I have personally reviewed following labs and imaging studies  CBC: Recent Labs  Lab 06/13/18 0327 06/14/18 0258 06/15/18 0223 06/17/18 1615 06/18/18 0404  WBC 10.6* 12.9* 12.7* 16.9* 10.8*  NEUTROABS  --   --   --  15.2*  --   HGB 14.6 14.7 13.7 15.1 13.6  HCT 41.4 41.3 39.1 44.1 39.0  MCV 87.7 86.6 86.3 86.6 87.1  PLT 125* 122* 131* 118* 630*   Basic Metabolic Panel: Recent Labs  Lab 06/13/18 0327 06/14/18 0633 06/15/18 0914 06/17/18 1615 06/18/18 0404  NA 129* 128* 128* 120* 124*  K 4.6 4.7 4.2 3.8 3.5  CL 97* 96* 93* 84* 90*  CO2 21* 21* 25 22 21*  GLUCOSE 128* 129* 101* 105* 87  BUN 28* 30* 34* 31* 36*  CREATININE 0.77 0.94 0.97 0.93 0.88  CALCIUM 8.4* 8.2* 8.6* 8.5* 7.9*   GFR: Estimated Creatinine Clearance: 53.7 mL/min (by C-G formula based on SCr of 0.88 mg/dL). Liver Function Tests: Recent Labs  Lab 06/18/18 0404  AST 39  ALT 38  ALKPHOS 74  BILITOT 2.0*  PROT 5.4*  ALBUMIN 3.0*   No results for input(s): LIPASE, AMYLASE in the last 168 hours. No results for input(s): AMMONIA in the last 168 hours. Coagulation Profile: Recent Labs  Lab 06/13/18 0804 06/14/18 0633 06/15/18 0223 06/17/18 1615 06/18/18 0931  INR 1.62 1.52 1.60 2.14 2.99   Cardiac Enzymes: Recent Labs  Lab 06/17/18 1615  TROPONINI 0.26*   BNP (last 3 results) No results for input(s): PROBNP in the last 8760  hours. HbA1C: No results for input(s): HGBA1C in the last 72 hours. CBG: No results for input(s): GLUCAP in the last 168 hours. Lipid Profile: No results for input(s): CHOL, HDL, LDLCALC, TRIG, CHOLHDL, LDLDIRECT in the last 72 hours. Thyroid Function Tests: No results for input(s): TSH, T4TOTAL, FREET4, T3FREE, THYROIDAB in the last 72 hours. Anemia Panel: No results for input(s): VITAMINB12, FOLATE, FERRITIN, TIBC, IRON, RETICCTPCT in the last 72 hours. Sepsis Labs: Recent Labs  Lab 06/12/18 0818 06/17/18 1615  PROCALCITON <0.10  --   LATICACIDVEN  --  1.7    No results found for this or any previous visit (from the past 240 hour(s)).       Radiology Studies: Dg Chest Port 1 View  Result Date: 06/17/2018 CLINICAL DATA:  SOB, PER ER NOTE, Pt called out due to SOB. When EMS arrived, O2 sat was 81% on 6L Home oxygen. RA was 60%. On nonrebreathing now  94%. History of COPD and lung cancer. BP 96/61 HISTORY OF CANCER, HTN EXAM: PORTABLE CHEST 1 VIEW COMPARISON:  CT and plain film on 06/07/2018, and earlier CT exam 05/08/2018 FINDINGS: Status post median sternotomy. The heart is enlarged. There is atherosclerotic calcification of the thoracic aorta. Stable appearance of RIGHT perihilar opacity. No new edema or consolidation. IMPRESSION: Stable appearance of the chest. Electronically Signed   By: Nolon Nations M.D.   On: 06/17/2018 16:48        Scheduled Meds: . aspirin EC  81 mg Oral Daily  . donepezil  10 mg Oral QHS  . feeding supplement (ENSURE ENLIVE)  237 mL Oral BID BM  . ipratropium-albuterol  3 mL Nebulization Q6H  . levothyroxine  112 mcg Oral Q0600  . linaclotide  145 mcg Oral QAC breakfast  . methylPREDNISolone (SOLU-MEDROL) injection  60 mg Intravenous Q6H  . midodrine  10 mg Oral TID WC  . mometasone-formoterol  2 puff Inhalation BID  . niacin  1,500 mg Oral QHS  . nystatin  5 mL Oral QID  . pantoprazole  40 mg Oral Daily  . rosuvastatin  20 mg Oral QHS   . tamsulosin  0.4 mg Oral Daily   Continuous Infusions: . fluconazole (DIFLUCAN) IV       LOS: 1 day    Time spent: 63mins    Kathie Dike, MD [facility] Pager [phone]  If 7PM-7AM, please contact night-coverage [email or web address] Password [email or web address] 06/18/2018, 10:18 AM

## 2018-06-18 NOTE — Telephone Encounter (Signed)
Requesting Order:  Skilled nurse: 1 time a week 1 week, 2 times a week for 4 weeks, 1 time a week for 4 weeks.  Home health Aide: 1 time a week for 9 weeks     Warfin 7.5 Friday, Sun, Tuesday   Warfin 5mg  RRN, Wednesday, Thursday, Saturday   [facility] would like to know if Dr.Steve would like an order to check INR.

## 2018-06-18 NOTE — Progress Notes (Signed)
Patient on 6lpm with humidity ; asleep.

## 2018-06-18 NOTE — Consult Note (Signed)
Consultation Note Date: 06/18/2018   Patient Name: [name] [name]  DOB: [birth year]  MRN: [id number]  Age / Sex: 77 y.o., male  PCP: [name], MD Referring Physician: Kathie Dike, MD  Reason for Consultation: Disposition and Inpatient hospice referral  HPI/Patient Profile: 77 y.o. male  with past medical history of stage III adenocarcinoma of right lung, COPD, A. fib on blood thinners, NSTEMI December 19 with extended stay, medical management, history of CABG, coronary artery disease, hypertension, low thyroid, GERD with hiatal hernia, osteoporosis, renal artery stenosis, peripheral vascular disease, admitted on 06/17/2018 with hypotension, hypovolemia, COPD exacerbation with acute respiratory failure.  Clinical Assessment and Goals of Care: Conference with hospitalist and nursing staff related to patient needs, family dynamics, goals of care discussions.  Mr. Hinnenkamp is resting quietly in bed.  He will make an mostly keep eye contact.  He has known dementia.  Present today at bedside his wife [name].  Mr. Montefusco agrees for me to talk with his wife, there are no other family members present.  [name] tells me of Mr. Jividen struggle over the last year, the last month in particular.  She tells me that he has declined and is very frail. She tells me several times that she and family want to make him comfortable.  We talk about comfort measures, and unburdening Mr. Naples from treatments that aren't changing what is happening.  We specifically speak about no IV fluids and and burdening Mr. Sprowl from medications.  We talk about modern medicine prolonging life but also prolonging the dying process.  [name] states she wants to focus on her husband and not the numbers.    [name] tells me that her monther died at [facility] in August, but only had one night there.  She tells me about  her experience with others who have received excellent care at hospice.    I share that hospice may not be able to accept Mr. Ginsberg at this time, as they are full, but he will be high on the list when a bed becomes available. We talk about his ability to transfer, he must be stable.  She agrees.     Contact person will be wife [name]. She is tearful during our conversation, stating they have 7 grandchildren and have not been able to have Christmas gathering.  She states that her eldest son lives and works out of town, and he is considering whether he should stay or return to work.  I encourage family to stay at this time.  HCPOA HCPOA -legal paperwork noted in epic chart under documents tab.  Wife [name] listed as legal HC POA.  SUMMARY OF RECOMMENDATIONS   Comfort measures Continue medications that are focused to comfort as long as Mr. Coke wants to take them. Requesting comfort and dignity at end-of-life, residential hospice at [facility]:  DNR  Symptom Management:   Added oral morphine  Added Ativan  Eyedrops and bowel regimen  Palliative Prophylaxis:   Aspiration, Frequent  Pain Assessment, Oral Care and Turn Reposition  Additional Recommendations (Limitations, Scope, Preferences):  Comfort care, requesting residential hospice.  Psycho-social/Spiritual:   Desire for further Chaplaincy support:no  Additional Recommendations: Caregiving  Support/Resources and Education on Hospice  Prognosis:   < 2 weeks, or less would not be surprising based on N STEMI December 19 with medical management, end-stage COPD, patient and family desire to focus on comfort and dignity at end-of-life.  Discharge Planning: Requesting comfort and dignity at end-of-life, residential hospice with [facility].      Primary Diagnoses: Present on Admission: . Coronary atherosclerosis . ATRIAL FIBRILLATION . COPD with acute exacerbation ([place]) . AAA  (abdominal aortic aneurysm) ([place]) . PAF (paroxysmal atrial fibrillation) ([place]) . Hyponatremia . Confusion . Ischemic cardiomyopathy . Hypotension . Thrombocytopenia ([place]) . SIRS (systemic inflammatory response syndrome) (H[facility])   I have reviewed the medical record, interviewed the patient and family, and examined the patient. The following aspects are pertinent.  Past Medical History:  Diagnosis Date  . Adenocarcinoma of right lung, stage 3 ([place]) 09/20/2016  . Atrial fibrillation ([place])    no hx of reported at preop visit of 04/21/11   . CAD (coronary artery disease)   . Carotid artery occlusion    left carotid endarterectomy  . Chronic kidney disease    AAA repair with reimplant of renals   . CHRONIC OBSTRUCTIVE PULMONARY DISEASE    pt denied at visit of 04/21/11   . CORONARY ARTERY DISEASE   . Dehydration 11/15/2016  . Disorder of left sacroiliac joint 08/24/2016  . DIVERTICULOSIS OF COLON   . Encounter for antineoplastic chemotherapy 10/17/2016  . GERD   . H/O hiatal hernia   . Headache(784.0)    Hx: of years of years  . HYPERTENSION   . HYPOTHYROIDISM   . JOINT EFFUSION, KNEE   . KNEE, ARTHRITIS, DEGEN./OSTEO    right knee   . Myocardial infarction ([place])    1994   . NEPHROLITHIASIS, HX OF   . OSTEOPOROSIS   . Other dysphagia   . PERIPHERAL VASCULAR DISEASE    AAA - 1994 with reimplant of renals   . Peripheral vascular disease ([place])    subclavian stenosis PTA - 3/08   . Pure hypercholesterolemia   . Renal artery stenosis [facility])    Social History   Socioeconomic History  . Marital status: Married    Spouse name: Not on file  . Number of children: Not on file  . Years of education: Not on file  . Highest education level: Not on file  Occupational History  . Not on file  Social Needs  . Financial resource strain: Not on file  . Food insecurity:    Worry: Not on file    Inability: Not on file  . Transportation needs:    Medical: Not on file    Non-medical: Not on  file  Tobacco Use  . Smoking status: Former Smoker    Types: Cigarettes    Last attempt to quit: 06/20/1992    Years since quitting: 26.0  . Smokeless tobacco: Never Used  Substance and Sexual Activity  . Alcohol use: No    Alcohol/week: 0.0 standard drinks  . Drug use: No  . Sexual activity: Not on file  Lifestyle  . Physical activity:    Days per week: Not on file    Minutes per session: Not on file  . Stress: Not on file  Relationships  . Social connections:    Talks on phone:  Not on file    Gets together: Not on file    Attends religious service: Not on file    Active member of club or organization: Not on file    Attends meetings of clubs or organizations: Not on file    Relationship status: Not on file  Other Topics Concern  . Not on file  Social History Narrative  . Not on file   Family History  Problem Relation Age of Onset  . Hypertension Mother   . Stroke Mother   . Heart disease Father   . Heart attack Father   . Diabetes Sister   . Cancer Brother 55       Lung  . Diabetes Sister    Scheduled Meds: . aspirin EC  81 mg Oral Daily  . donepezil  10 mg Oral QHS  . feeding supplement (ENSURE ENLIVE)  237 mL Oral BID BM  . ipratropium-albuterol  3 mL Nebulization Q6H  . levothyroxine  112 mcg Oral Q0600  . linaclotide  145 mcg Oral QAC breakfast  . methylPREDNISolone (SOLU-MEDROL) injection  60 mg Intravenous Q6H  . midodrine  10 mg Oral TID WC  . mometasone-formoterol  2 puff Inhalation BID  . niacin  1,500 mg Oral QHS  . nystatin  5 mL Oral QID  . pantoprazole  40 mg Oral Daily  . rosuvastatin  20 mg Oral QHS  . tamsulosin  0.4 mg Oral Daily   Continuous Infusions: . fluconazole (DIFLUCAN) IV     PRN Meds:.acetaminophen, albuterol, nitroGLYCERIN, ondansetron **OR** ondansetron (ZOFRAN) IV, oxyCODONE, polyethylene glycol, traZODone Medications Prior to Admission:  Prior to Admission medications   Medication Sig Start Date End Date Taking? Authorizing  Provider  acetaminophen (TYLENOL) 325 MG tablet Take 325-650 mg by mouth daily as needed for headache (for pain).    Yes [provider]  albuterol (PROVENTIL HFA;VENTOLIN HFA) 108 (90 Base) MCG/ACT inhaler Inhale 2 puffs into the lungs every 6 (six) hours as needed for wheezing or shortness of breath. 06/14/18  Yes Rai, Ripudeep K, MD  aspirin EC 81 MG EC tablet Take 1 tablet (81 mg total) by mouth daily. 06/14/18  Yes Rai, Ripudeep K, MD  bumetanide (BUMEX) 1 MG tablet Take 1 tablet (1 mg total) by mouth daily. 06/15/18  Yes Rai, Ripudeep K, MD  donepezil (ARICEPT) 10 MG tablet Take 1 tablet daily Patient taking differently: Take 10 mg by mouth at bedtime.  04/03/18  Yes Cameron Sprang, MD  lansoprazole (PREVACID) 15 MG capsule Take 1 capsule (15 mg total) by mouth daily. Patient taking differently: Take 15 mg by mouth every morning.  09/16/16  Yes Angiulli, Lavon Paganini, PA-C  levothyroxine (SYNTHROID, LEVOTHROID) 112 MCG tablet Take 1 tablet (112 mcg total) by mouth daily. Patient taking differently: Take 112 mcg by mouth every morning.  08/17/17  Yes [name], MD  linaclotide [facility]) 145 MCG CAPS capsule Take 1 capsule (145 mcg total) by mouth daily before breakfast. 08/17/17  Yes [name], MD  lisinopril (PRINIVIL,ZESTRIL) 2.5 MG tablet Take 1 tablet (2.5 mg total) by mouth daily. 06/14/18  Yes Rai, Ripudeep K, MD  metoprolol succinate (TOPROL-XL) 25 MG 24 hr tablet Take 0.5 tablets (12.5 mg total) by mouth daily. 06/14/18  Yes Rai, Ripudeep K, MD  mometasone-formoterol (DULERA) 100-5 MCG/ACT AERO Inhale 2 puffs into the lungs 2 (two) times daily. 06/14/18  Yes Rai, Ripudeep K, MD  niacin (SLO-NIACIN) 500 MG tablet Take 3 tablets (1,500 mg  total) by mouth at bedtime. 09/16/16  Yes Angiulli, Lavon Paganini, PA-C  nitroGLYCERIN (NITROSTAT) 0.4 MG SL tablet Place 1 tablet (0.4 mg total) under the tongue every 5 (five) minutes as needed for chest pain. 09/16/16  Yes Angiulli, Lavon Paganini, PA-C  nystatin (MYCOSTATIN) 100000 UNIT/ML suspension Take 5 mLs (500,000 Units total) by mouth 4 (four) times daily. 06/15/18  Yes Rai, Ripudeep K, MD  rosuvastatin (CRESTOR) 20 MG tablet Take 1 tablet (20 mg total) by mouth daily. Patient taking differently: Take 20 mg by mouth at bedtime.  01/05/18  Yes [name], MD  tamsulosin (FLOMAX) 0.4 MG CAPS capsule TAKE ONE CAPSULE BY MOUTH TWICE A DAY. Patient taking differently: Take 0.4 mg by mouth daily.  09/07/17  Yes [name], MD  warfarin (COUMADIN) 5 MG tablet TAKE ONE TABLET ON MONDAY WEDNESDAY AND SATURDAY, TAKE ONE AND ONE-HALF ON SUNDAY TUESDAY Parachute Patient taking differently: Take 5 mg by mouth See admin instructions.  04/19/18  Yes [name], MD   Allergies  Allergen Reactions  . Neurontin [Gabapentin] Other (See Comments)    Caused confusion  . Imitrex [Sumatriptan] Nausea And Vomiting and Other (See Comments)    "made me like I was having a stoke"  . Lipitor [Atorvastatin] Nausea And Vomiting and Other (See Comments)    INTOLERANCE > MYALGIAS "couldn't get out of the bed ( pt had to crawl out of bed ) I hurt so bad"   Review of Systems  Unable to perform ROS: Other    Physical Exam Vitals signs and nursing note reviewed.  Constitutional:      Comments: Appears acutely/chronically ill and frail.  Will make an mostly keep eye contact.  Cardiovascular:     Rate and Rhythm: Normal rate.  Pulmonary:     Comments: Some work of breathing noted Abdominal:     Palpations: Abdomen is soft.  Musculoskeletal:     Right lower leg: No edema.     Left lower leg: No edema.  Skin:    General: Skin is warm and dry.  Neurological:     Comments: Alert, orientation questions not asked, known dementia  Psychiatric:        Mood and Affect: Mood is not anxious.        Behavior: Behavior normal. Behavior is not agitated.     Vital Signs: BP 122/89   Pulse 77   Temp (!) 97.3 F (36.3 C) (Oral)   Resp 17    Ht 5\' 7"  (1.702 m)   Wt 55.8 kg   SpO2 100%   BMI 19.27 kg/m  Pain Scale: 0-10 POSS *See Group Information*: 1-Acceptable,Awake and alert Pain Score: 0-No pain   SpO2: SpO2: 100 % O2 Device:SpO2: 100 % O2 Flow Rate: .O2 Flow Rate (L/min): 5 L/min  IO: Intake/output summary:   Intake/Output Summary (Last 24 hours) at 06/18/2018 1056 Last data filed at 06/18/2018 0500 Gross per 24 hour  Intake 4.58 ml  Output 500 ml  Net -495.42 ml    LBM: Last BM Date: 06/17/18 Baseline Weight: Weight: 55.3 kg Most recent weight: Weight: 55.8 kg     Palliative Assessment/Data:   Flowsheet Rows     Most Recent Value  Intake Tab  Referral Department  Hospitalist  Unit at Time of Referral  ICU  Palliative Care Primary Diagnosis  Pulmonary  Date Notified  06/17/18  Palliative Care Type  New Palliative care  Reason for referral  Clarify Goals of Care,  Counsel Regarding Hospice  Date of Admission  06/17/18  Date first seen by Palliative Care  06/18/18  # of days Palliative referral response time  1 Day(s)  # of days IP prior to Palliative referral  0  Clinical Assessment  Palliative Performance Scale Score  30%  Pain Max last 24 hours  Not able to report  Pain Min Last 24 hours  Not able to report  Dyspnea Max Last 24 Hours  Not able to report  Dyspnea Min Last 24 hours  Not able to report  Psychosocial & Spiritual Assessment  Palliative Care Outcomes  Patient/Family meeting held?  Yes  Palliative Care Outcomes  Clarified goals of care, Improved non-pain symptom therapy, Provided end of life care assistance, Transitioned to hospice, Changed to focus on comfort  Patient/Family wishes: Interventions discontinued/not started   Mechanical Ventilation, Vasopressors      Time In: 0950 Time Out: 1100 Time Total: 70 minutes Greater than 50%  of this time was spent counseling and coordinating care related to the above assessment and plan.  Signed by: Drue Novel, NP   Please  contact Palliative Medicine Team phone at [phone] for questions and concerns.  For individual provider: See Shea Evans

## 2018-06-18 NOTE — Telephone Encounter (Signed)
Pt is now in hospital again

## 2018-06-19 ENCOUNTER — Inpatient Hospital Stay (HOSPITAL_COMMUNITY)
Admit: 2018-06-19 | Discharge: 2018-07-21 | DRG: 190 | Disposition: E | Source: Ambulatory Visit | Attending: Internal Medicine | Admitting: Internal Medicine

## 2018-06-19 ENCOUNTER — Telehealth: Payer: Self-pay | Admitting: Family Medicine

## 2018-06-19 DIAGNOSIS — G988 Other disorders of nervous system: Secondary | ICD-10-CM | POA: Diagnosis not present

## 2018-06-19 DIAGNOSIS — Z8679 Personal history of other diseases of the circulatory system: Secondary | ICD-10-CM

## 2018-06-19 DIAGNOSIS — I10 Essential (primary) hypertension: Secondary | ICD-10-CM | POA: Diagnosis present

## 2018-06-19 DIAGNOSIS — E871 Hypo-osmolality and hyponatremia: Secondary | ICD-10-CM | POA: Diagnosis not present

## 2018-06-19 DIAGNOSIS — F0280 Dementia in other diseases classified elsewhere without behavioral disturbance: Secondary | ICD-10-CM | POA: Diagnosis not present

## 2018-06-19 DIAGNOSIS — I252 Old myocardial infarction: Secondary | ICD-10-CM | POA: Diagnosis not present

## 2018-06-19 DIAGNOSIS — I25810 Atherosclerosis of coronary artery bypass graft(s) without angina pectoris: Secondary | ICD-10-CM | POA: Diagnosis present

## 2018-06-19 DIAGNOSIS — Z8249 Family history of ischemic heart disease and other diseases of the circulatory system: Secondary | ICD-10-CM

## 2018-06-19 DIAGNOSIS — R0602 Shortness of breath: Secondary | ICD-10-CM | POA: Diagnosis not present

## 2018-06-19 DIAGNOSIS — E039 Hypothyroidism, unspecified: Secondary | ICD-10-CM | POA: Diagnosis present

## 2018-06-19 DIAGNOSIS — J9601 Acute respiratory failure with hypoxia: Secondary | ICD-10-CM | POA: Diagnosis present

## 2018-06-19 DIAGNOSIS — F0390 Unspecified dementia without behavioral disturbance: Secondary | ICD-10-CM | POA: Diagnosis present

## 2018-06-19 DIAGNOSIS — Z87891 Personal history of nicotine dependence: Secondary | ICD-10-CM | POA: Diagnosis not present

## 2018-06-19 DIAGNOSIS — K219 Gastro-esophageal reflux disease without esophagitis: Secondary | ICD-10-CM | POA: Diagnosis present

## 2018-06-19 DIAGNOSIS — I739 Peripheral vascular disease, unspecified: Secondary | ICD-10-CM | POA: Diagnosis present

## 2018-06-19 DIAGNOSIS — Z515 Encounter for palliative care: Secondary | ICD-10-CM | POA: Diagnosis present

## 2018-06-19 DIAGNOSIS — I4891 Unspecified atrial fibrillation: Secondary | ICD-10-CM | POA: Diagnosis present

## 2018-06-19 DIAGNOSIS — Z7901 Long term (current) use of anticoagulants: Secondary | ICD-10-CM

## 2018-06-19 DIAGNOSIS — Z9049 Acquired absence of other specified parts of digestive tract: Secondary | ICD-10-CM | POA: Diagnosis not present

## 2018-06-19 DIAGNOSIS — Z79899 Other long term (current) drug therapy: Secondary | ICD-10-CM

## 2018-06-19 DIAGNOSIS — Z87442 Personal history of urinary calculi: Secondary | ICD-10-CM | POA: Diagnosis not present

## 2018-06-19 DIAGNOSIS — I5022 Chronic systolic (congestive) heart failure: Secondary | ICD-10-CM | POA: Diagnosis present

## 2018-06-19 DIAGNOSIS — Z955 Presence of coronary angioplasty implant and graft: Secondary | ICD-10-CM | POA: Diagnosis not present

## 2018-06-19 DIAGNOSIS — Z888 Allergy status to other drugs, medicaments and biological substances status: Secondary | ICD-10-CM

## 2018-06-19 DIAGNOSIS — I2510 Atherosclerotic heart disease of native coronary artery without angina pectoris: Secondary | ICD-10-CM

## 2018-06-19 DIAGNOSIS — Z79890 Hormone replacement therapy (postmenopausal): Secondary | ICD-10-CM

## 2018-06-19 DIAGNOSIS — Z833 Family history of diabetes mellitus: Secondary | ICD-10-CM

## 2018-06-19 DIAGNOSIS — J441 Chronic obstructive pulmonary disease with (acute) exacerbation: Principal | ICD-10-CM | POA: Diagnosis present

## 2018-06-19 DIAGNOSIS — N189 Chronic kidney disease, unspecified: Secondary | ICD-10-CM | POA: Diagnosis not present

## 2018-06-19 DIAGNOSIS — Z96653 Presence of artificial knee joint, bilateral: Secondary | ICD-10-CM | POA: Diagnosis present

## 2018-06-19 DIAGNOSIS — I714 Abdominal aortic aneurysm, without rupture, unspecified: Secondary | ICD-10-CM | POA: Diagnosis present

## 2018-06-19 DIAGNOSIS — C342 Malignant neoplasm of middle lobe, bronchus or lung: Secondary | ICD-10-CM | POA: Diagnosis present

## 2018-06-19 DIAGNOSIS — M810 Age-related osteoporosis without current pathological fracture: Secondary | ICD-10-CM | POA: Diagnosis present

## 2018-06-19 DIAGNOSIS — C3490 Malignant neoplasm of unspecified part of unspecified bronchus or lung: Secondary | ICD-10-CM | POA: Diagnosis not present

## 2018-06-19 DIAGNOSIS — I480 Paroxysmal atrial fibrillation: Secondary | ICD-10-CM | POA: Diagnosis present

## 2018-06-19 DIAGNOSIS — K314 Gastric diverticulum: Secondary | ICD-10-CM | POA: Diagnosis not present

## 2018-06-19 DIAGNOSIS — Z7982 Long term (current) use of aspirin: Secondary | ICD-10-CM

## 2018-06-19 DIAGNOSIS — E7800 Pure hypercholesterolemia, unspecified: Secondary | ICD-10-CM | POA: Diagnosis present

## 2018-06-19 DIAGNOSIS — Z823 Family history of stroke: Secondary | ICD-10-CM

## 2018-06-19 MED ORDER — LORAZEPAM 2 MG/ML IJ SOLN: 1.0000 mg | INTRAMUSCULAR | Status: DC | PRN

## 2018-06-19 MED ORDER — ONDANSETRON HCL 4 MG/2ML IJ SOLN: 4.0000 mg | Freq: Four times a day (QID) | INTRAMUSCULAR | Status: DC | PRN

## 2018-06-19 MED ORDER — MORPHINE SULFATE (PF) 2 MG/ML IV SOLN
1.0000 mg | INTRAVENOUS | Status: DC | PRN
  Administered 2018-06-19: 1 mg via INTRAVENOUS
  Filled 2018-06-19: qty 1

## 2018-06-19 MED ORDER — GLYCOPYRROLATE 0.2 MG/ML IJ SOLN: 0.2000 mg | INTRAMUSCULAR | Status: DC | PRN

## 2018-06-19 MED ORDER — MOMETASONE FURO-FORMOTEROL FUM 100-5 MCG/ACT IN AERO
2.0000 | INHALATION_SPRAY | Freq: Two times a day (BID) | RESPIRATORY_TRACT | Status: DC
  Filled 2018-06-19: qty 8.8

## 2018-06-19 MED ORDER — HALOPERIDOL LACTATE 2 MG/ML PO CONC
0.5000 mg | ORAL | Status: DC | PRN
  Filled 2018-06-19: qty 0.3

## 2018-06-19 MED ORDER — ORAL CARE MOUTH RINSE: 15.0000 mL | Freq: Two times a day (BID) | OROMUCOSAL | Status: DC

## 2018-06-19 MED ORDER — HALOPERIDOL LACTATE 5 MG/ML IJ SOLN
0.5000 mg | INTRAMUSCULAR | Status: DC | PRN
  Administered 2018-06-20: 0.5 mg via INTRAVENOUS
  Filled 2018-06-19: qty 1

## 2018-06-19 MED ORDER — LORAZEPAM 1 MG PO TABS: 1.0000 mg | ORAL_TABLET | ORAL | Status: DC | PRN

## 2018-06-19 MED ORDER — LORAZEPAM 2 MG/ML PO CONC
1.0000 mg | ORAL | Status: DC | PRN
  Filled 2018-06-19: qty 0.5

## 2018-06-19 MED ORDER — ACETAMINOPHEN 325 MG PO TABS: 650.0000 mg | ORAL_TABLET | Freq: Four times a day (QID) | ORAL | Status: DC | PRN

## 2018-06-19 MED ORDER — POLYVINYL ALCOHOL 1.4 % OP SOLN: 1.0000 [drp] | Freq: Four times a day (QID) | OPHTHALMIC | Status: DC | PRN

## 2018-06-19 MED ORDER — ACETAMINOPHEN 650 MG RE SUPP: 650.0000 mg | Freq: Four times a day (QID) | RECTAL | Status: DC | PRN

## 2018-06-19 MED ORDER — GLYCOPYRROLATE 1 MG PO TABS: 1.0000 mg | ORAL_TABLET | ORAL | Status: DC | PRN

## 2018-06-19 MED ORDER — IPRATROPIUM-ALBUTEROL 0.5-2.5 (3) MG/3ML IN SOLN: 3.0000 mL | Freq: Four times a day (QID) | RESPIRATORY_TRACT | Status: DC

## 2018-06-19 MED ORDER — LINACLOTIDE 145 MCG PO CAPS: 145.0000 ug | ORAL_CAPSULE | Freq: Every day | ORAL | Status: DC

## 2018-06-19 MED ORDER — PANTOPRAZOLE SODIUM 40 MG PO TBEC: 40.0000 mg | DELAYED_RELEASE_TABLET | Freq: Every day | ORAL | Status: DC

## 2018-06-19 MED ORDER — BIOTENE DRY MOUTH MT LIQD: 15.0000 mL | OROMUCOSAL | Status: DC | PRN

## 2018-06-19 MED ORDER — ONDANSETRON 4 MG PO TBDP: 4.0000 mg | ORAL_TABLET | Freq: Four times a day (QID) | ORAL | Status: DC | PRN

## 2018-06-19 MED ORDER — HALOPERIDOL 0.5 MG PO TABS: 0.5000 mg | ORAL_TABLET | ORAL | Status: DC | PRN

## 2018-06-19 NOTE — Telephone Encounter (Signed)
FYI

## 2018-06-19 NOTE — Progress Notes (Signed)
Patient alert and oriented to self. Easily reoriented to place and time. Family at bedside, emotional support provided. Comfort cart provided.

## 2018-06-19 NOTE — Telephone Encounter (Signed)
Pt's wife called & wanted to let Dr. Richardson Landry that pt is in ICU at [facility] and to let us know that Hospice may be contacting us

## 2018-06-19 NOTE — Progress Notes (Signed)
PROGRESS NOTE    [name] [name]  VOJ:[id number] DOB: 02/17/1941 DOA: 06/17/2018 PCP: [name], MD    Brief Narrative:  77 year old male with a history of coronary artery disease, right-sided lung cancer, atrial fibrillation on anticoagulation, COPD, chronic systolic congestive heart failure with ejection fraction of 35%, who was recently discharged from [facility] after being treated for non-ST elevation MI and underwent cardiac catheterization.  He has been declining for the past month.  He came back to the hospital within 2 days of discharge with worsening shortness of breath.  Chest x-ray did not show any acute findings.  He was noted to be hypotensive, hyponatremic due to possible volume depletion.  Was started on IV fluids.  Unfortunately, he remains hypotensive.  Prognosis appears to be poor.  Palliative care has been consulted.   Assessment & Plan:   Active Problems:   Coronary atherosclerosis   ATRIAL FIBRILLATION   COPD with acute exacerbation (H[facility])   CORONARY ARTERY BYPASS GRAFT, HX OF   AAA (abdominal aortic aneurysm) (HCC)   PAF (paroxysmal atrial fibrillation) (HCC)   Hyponatremia   Confusion   Dementia, neurological (HCC)   Ischemic cardiomyopathy   Hypotension   Thrombocytopenia (HCC)   SIRS (systemic inflammatory response syndrome) ([place])   Malignant neoplasm of lung ([place])   Palliative care by specialist   Encounter for hospice care discussion   1. Hypotension.  Possibly related to hypovolemia as well as low cardiac output.  He does not appear septic at this time.  Lactic acid was normal.  He is not febrile.  He received some IV fluids.  Blood pressure still on the low side. 2. Hyponatremia.  Suspect related to hypovolemia.  Has some component of SIADH as well.  Mild improvement with saline. 3. Severe COPD.  Continue bronchodilators as needed 4. Acute respiratory failure with hypoxia.  Recently discharged from the hospital on 5 to 6 L of oxygen.  Family  reports that he was not on oxygen prior to that hospitalization.  Chest x-ray done on admission shows stable appearance of chest 5. Chronic systolic congestive heart failure.  Ejection fraction of 35 to 40%.  Starting to show signs of volume overload with pedal edema.  Blood pressure will not tolerate diuresis at this time. 6. Paroxysmal atrial fibrillation.  Heart rate is currently stable.  Anticoagulated with Coumadin. 7. CAD status post CABG in the past with recent non-STEMI.  Patient recently underwent cardiac catheterization.  Recommendation for medical management. 8. Dementia.  Continue Aricept. 9. Abdominal aortic aneurysm.  Stable.  For outpatient follow-up. 10. Lung cancer.  He is completed radiation and immunotherapy.  Plans are to follow-up with oncology as an outpatient 11. Failure to thrive/goals of care.  Patient has had poor p.o. intake for the last month.  He has been losing weight.  He is generally weak.  With his multiple medical problems, his overall prognosis is poor.  Family has already agreed to DNR and after meeting with palliative care have elected to pursue comfort measures.  Hospice referral in process   DVT prophylaxis: Coumadin Code Status: DNR Family Communication: Discussed with wife at the bedside Disposition Plan: Hospice referral in process   Consultants:   Palliative care  Procedures:     Antimicrobials:      Subjective: Feels that his breathing is better.  Sleeping on my arrival, wakes up to voice.  Wife says that he did not have any breakfast this morning.  He ate a few bites  for dinner last night.  Objective: Vitals:   06/01/2018 0500 05/28/2018 0600 06/18/2018 0838 05/20/2018 0839  BP:   (!) 94/56 92/61  Pulse: 81 83 94 94  Resp: 14 13 (!) 21 (!) 24  Temp:      TempSrc:      SpO2: 98% 99% 100% 98%  Weight:      Height:        Intake/Output Summary (Last 24 hours) at 06/12/2018 0929 Last data filed at 06/18/2018 1813 Gross per 24 hour    Intake -  Output 350 ml  Net -350 ml   Filed Weights   06/17/18 2245 06/18/18 0500  Weight: 55.3 kg 55.8 kg    Examination:  General exam: Alert, awake, no distress Respiratory system: Crackles at bases. Respiratory effort normal. Cardiovascular system:RRR. No murmurs, rubs, gallops. Gastrointestinal system: Abdomen is nondistended, soft and nontender. No organomegaly or masses felt. Normal bowel sounds heard.  Data Reviewed: I have personally reviewed following labs and imaging studies  CBC: Recent Labs  Lab 06/13/18 0327 06/14/18 0258 06/15/18 0223 06/17/18 1615 06/18/18 0404  WBC 10.6* 12.9* 12.7* 16.9* 10.8*  NEUTROABS  --   --   --  15.2*  --   HGB 14.6 14.7 13.7 15.1 13.6  HCT 41.4 41.3 39.1 44.1 39.0  MCV 87.7 86.6 86.3 86.6 87.1  PLT 125* 122* 131* 118* 643*   Basic Metabolic Panel: Recent Labs  Lab 06/13/18 0327 06/14/18 0633 06/15/18 0914 06/17/18 1615 06/18/18 0404  NA 129* 128* 128* 120* 124*  K 4.6 4.7 4.2 3.8 3.5  CL 97* 96* 93* 84* 90*  CO2 21* 21* 25 22 21*  GLUCOSE 128* 129* 101* 105* 87  BUN 28* 30* 34* 31* 36*  CREATININE 0.77 0.94 0.97 0.93 0.88  CALCIUM 8.4* 8.2* 8.6* 8.5* 7.9*   GFR: Estimated Creatinine Clearance: 53.7 mL/min (by C-G formula based on SCr of 0.88 mg/dL). Liver Function Tests: Recent Labs  Lab 06/18/18 0404  AST 39  ALT 38  ALKPHOS 74  BILITOT 2.0*  PROT 5.4*  ALBUMIN 3.0*   No results for input(s): LIPASE, AMYLASE in the last 168 hours. No results for input(s): AMMONIA in the last 168 hours. Coagulation Profile: Recent Labs  Lab 06/13/18 0804 06/14/18 0633 06/15/18 0223 06/17/18 1615 06/18/18 0931  INR 1.62 1.52 1.60 2.14 2.99   Cardiac Enzymes: Recent Labs  Lab 06/17/18 1615  TROPONINI 0.26*   BNP (last 3 results) No results for input(s): PROBNP in the last 8760 hours. HbA1C: No results for input(s): HGBA1C in the last 72 hours. CBG: No results for input(s): GLUCAP in the last 168  hours. Lipid Profile: No results for input(s): CHOL, HDL, LDLCALC, TRIG, CHOLHDL, LDLDIRECT in the last 72 hours. Thyroid Function Tests: No results for input(s): TSH, T4TOTAL, FREET4, T3FREE, THYROIDAB in the last 72 hours. Anemia Panel: No results for input(s): VITAMINB12, FOLATE, FERRITIN, TIBC, IRON, RETICCTPCT in the last 72 hours. Sepsis Labs: Recent Labs  Lab 06/17/18 1615  LATICACIDVEN 1.7    No results found for this or any previous visit (from the past 240 hour(s)).       Radiology Studies: Dg Chest Port 1 View  Result Date: 06/17/2018 CLINICAL DATA:  SOB, PER ER NOTE, Pt called out due to SOB. When EMS arrived, O2 sat was 81% on 6L Home oxygen. RA was 60%. On nonrebreathing now 94%. History of COPD and lung cancer. BP 96/61 HISTORY OF CANCER, HTN EXAM: PORTABLE CHEST 1 VIEW COMPARISON:  CT and plain film on 06/07/2018, and earlier CT exam 05/08/2018 FINDINGS: Status post median sternotomy. The heart is enlarged. There is atherosclerotic calcification of the thoracic aorta. Stable appearance of RIGHT perihilar opacity. No new edema or consolidation. IMPRESSION: Stable appearance of the chest. Electronically Signed   By: Nolon Nations M.D.   On: 06/17/2018 16:48        Scheduled Meds: . feeding supplement (ENSURE ENLIVE)  237 mL Oral BID BM  . ipratropium-albuterol  3 mL Nebulization Q6H  . linaclotide  145 mcg Oral QAC breakfast  . mometasone-formoterol  2 puff Inhalation BID  . nystatin  5 mL Oral QID  . tamsulosin  0.4 mg Oral Daily   Continuous Infusions:    LOS: 2 days    Time spent: 86mins    Kathie Dike, MD [facility] Pager [phone]  If 7PM-7AM, please contact night-coverage [email or web address] Password [facility] 05/27/2018, 9:29 AM

## 2018-06-19 NOTE — Discharge Summary (Signed)
Physician Discharge Summary  [name] [name] KKX:[id number] DOB: 12-09-1940 DOA: 06/17/2018  PCP: [name], MD  Admit date: 06/17/2018 Discharge date: 05/21/2018  Admitted From: Home Disposition: Residential hospice/GIP  Recommendations for Outpatient Follow-up:  1. Patient will be discharged to residential hospice once bed is available.  Currently GIP status  Discharge Condition: Comfort care CODE STATUS: DNR Diet recommendation: Regular diet for comfort  Brief/Interim Summary: 77 year old male with history of coronary artery disease, lung cancer, atrial fibrillation on anticoagulation, COPD, chronic systolic heart failure with ejection fraction of 35%, who was recently discharged from hospital after being treated for non-ST elevation MI and underwent cardiac catheterization.  Family had reported that he had been overall declining for the last month.  After discharge from [facility], he came back to [facility] within 2 days of discharge with worsening shortness of breath.  Chest x-ray did not show any acute findings.  He was noted to be hypotensive, hyponatremic due to volume depletion and was started on IV fluids.  Despite receiving IV fluids, his blood pressure remained low, and he began to become edematous.  He was seen by palliative care and after discussion with the patient's wife, patient and family, they have elected to pursue comfort care.  Plan will be to discharge to residential hospice for quality of life/end-of-life care.  Since there are no beds available at residential hospice, he has been made GIP status.  Discharge Diagnoses:  Active Problems:   Coronary atherosclerosis   ATRIAL FIBRILLATION   COPD with acute exacerbation (HCC)   CORONARY ARTERY BYPASS GRAFT, HX OF   AAA (abdominal aortic aneurysm) (HCC)   PAF (paroxysmal atrial fibrillation) (HCC)   Hyponatremia   Confusion   Dementia, neurological (HCC)   Ischemic cardiomyopathy   Hypotension    Thrombocytopenia (HCC)   SIRS (systemic inflammatory response syndrome) ([place])   Malignant neoplasm of lung ([place])   Palliative care by specialist   Encounter for hospice care discussion    Discharge Instructions   Allergies as of 05/26/2018      Reactions   Neurontin [gabapentin] Other (See Comments)   Caused confusion   Imitrex [sumatriptan] Nausea And Vomiting, Other (See Comments)   "made me like I was having a stoke"   Lipitor [atorvastatin] Nausea And Vomiting, Other (See Comments)   INTOLERANCE > MYALGIAS "couldn't get out of the bed ( pt had to crawl out of bed ) I hurt so bad"      Medication List    ASK your doctor about these medications   acetaminophen 325 MG tablet Commonly known as:  TYLENOL Take 325-650 mg by mouth daily as needed for headache (for pain).   albuterol 108 (90 Base) MCG/ACT inhaler Commonly known as:  PROVENTIL HFA;VENTOLIN HFA Inhale 2 puffs into the lungs every 6 (six) hours as needed for wheezing or shortness of breath.   aspirin 81 MG EC tablet Take 1 tablet (81 mg total) by mouth daily.   bumetanide 1 MG tablet Commonly known as:  BUMEX Take 1 tablet (1 mg total) by mouth daily.   donepezil 10 MG tablet Commonly known as:  ARICEPT Take 1 tablet daily   lansoprazole 15 MG capsule Commonly known as:  PREVACID Take 1 capsule (15 mg total) by mouth daily.   levothyroxine 112 MCG tablet Commonly known as:  SYNTHROID, LEVOTHROID Take 1 tablet (112 mcg total) by mouth daily.   linaclotide 145 MCG Caps capsule Commonly known as:  LINZESS Take 1 capsule (  145 mcg total) by mouth daily before breakfast.   lisinopril 2.5 MG tablet Commonly known as:  PRINIVIL,ZESTRIL Take 1 tablet (2.5 mg total) by mouth daily.   metoprolol succinate 25 MG 24 hr tablet Commonly known as:  TOPROL-XL Take 0.5 tablets (12.5 mg total) by mouth daily.   mometasone-formoterol 100-5 MCG/ACT Aero Commonly known as:  DULERA Inhale 2 puffs into the lungs 2  (two) times daily.   niacin 500 MG tablet Commonly known as:  SLO-NIACIN Take 3 tablets (1,500 mg total) by mouth at bedtime.   nitroGLYCERIN 0.4 MG SL tablet Commonly known as:  NITROSTAT Place 1 tablet (0.4 mg total) under the tongue every 5 (five) minutes as needed for chest pain.   nystatin 100000 UNIT/ML suspension Commonly known as:  MYCOSTATIN Take 5 mLs (500,000 Units total) by mouth 4 (four) times daily.   predniSONE 20 MG tablet Commonly known as:  DELTASONE Take 2 tablets (40 mg total) by mouth daily with breakfast for 3 days. Ask about: Should I take this medication?   rosuvastatin 20 MG tablet Commonly known as:  CRESTOR Take 1 tablet (20 mg total) by mouth daily.   tamsulosin 0.4 MG Caps capsule Commonly known as:  FLOMAX TAKE ONE CAPSULE BY MOUTH TWICE A DAY.   warfarin 5 MG tablet Commonly known as:  COUMADIN Take as directed. If you are unsure how to take this medication, talk to your nurse or doctor. Original instructions:  TAKE ONE TABLET ON MONDAY WEDNESDAY AND SATURDAY, TAKE ONE AND ONE-HALF ON SUNDAY TUESDAY FRIDAY       Allergies  Allergen Reactions  . Neurontin [Gabapentin] Other (See Comments)    Caused confusion  . Imitrex [Sumatriptan] Nausea And Vomiting and Other (See Comments)    "made me like I was having a stoke"  . Lipitor [Atorvastatin] Nausea And Vomiting and Other (See Comments)    INTOLERANCE > MYALGIAS "couldn't get out of the bed ( pt had to crawl out of bed ) I hurt so bad"    Consultations:  Palliative care   Procedures/Studies: Dg Chest 2 View  Result Date: 06/07/2018 CLINICAL DATA:  Hypoxia.  Shortness of breath. EXAM: CHEST - 2 VIEW COMPARISON:  CT scan May 08, 2017 FINDINGS: Opacity in the right perihilar region is consistent with post therapeutic/radiation changes and is similar since November 2019. No pneumothorax. The cardiomediastinal silhouette is stable. No new nodules or masses. Mild scarring in the left  base. No new suspicious infiltrate. IMPRESSION: Post therapeutic changes on the right.  No acute interval changes. Electronically Signed   By: Dorise Bullion III M.D   On: 06/07/2018 17:28   Ct Angio Chest Pe W/cm &/or Wo Cm  Result Date: 06/07/2018 CLINICAL DATA:  Hypoxia. Shortness of breath for the past week. Right lung cancer. EXAM: CT ANGIOGRAPHY CHEST WITH CONTRAST TECHNIQUE: Multidetector CT imaging of the chest was performed using the standard protocol during bolus administration of intravenous contrast. Multiplanar CT image reconstructions and MIPs were obtained to evaluate the vascular anatomy. CONTRAST:  162mL ISOVUE-370 IOPAMIDOL (ISOVUE-370) INJECTION 76% COMPARISON:  CT chest dated May 08, 2018. FINDINGS: Cardiovascular: Satisfactory opacification of the pulmonary arteries to the segmental level. No evidence of pulmonary embolism. Stable cardiomegaly. No pericardial effusion. Prior CABG. No thoracic aortic aneurysm. Evaluation for aortic dissection is limited due to phase of contrast. Coronary, aortic arch, and branch vessel atherosclerotic vascular disease. 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 Mediastinum/Nodes: Stable right low jugular and paratracheal lymphadenopathy measuring up to 16 mm. Unchanged 9 mm subcarinal lymph node. Unchanged 10 mm left hilar lymph nodes. No axillary lymphadenopathy. The thyroid gland, trachea, and esophagus demonstrate no significant findings. Lungs/Pleura: Moderate centrilobular emphysema again noted. Unchanged 5 mm pulmonary nodule in the right upper lobe. Previously seen 5 mm nodule in the inferior right upper lobe is no longer identified. Unchanged geographic consolidation and traction bronchiectasis centered about the medial and perihilar right lung. Unchanged chronic right pleural thickening. No pneumothorax, pleural effusion or new airspace consolidation. Upper Abdomen: No  acute abnormality. Musculoskeletal: No chest wall abnormality. No acute or significant osseous findings. Chronic T6 and T8 mild compression deformities are unchanged. Review of the MIP images confirms the above findings. IMPRESSION: 1. No evidence of pulmonary embolism. No acute intrathoracic process. 2. Unchanged right perihilar radiation fibrosis. 3. Stable lower cervical and right paratracheal lymphadenopathy. 4. Previously seen new 5 mm pulmonary nodule in the right upper lobe is no longer identified and was likely inflammatory or infectious. 5. 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 6.  Emphysema (ICD10-J43.9). 7.  Aortic atherosclerosis (ICD10-I70.0). Electronically Signed   By: Titus Dubin M.D.   On: 06/07/2018 21:32   Dg Chest Port 1 View  Result Date: 06/17/2018 CLINICAL DATA:  SOB, PER ER NOTE, Pt called out due to SOB. When EMS arrived, O2 sat was 81% on 6L Home oxygen. RA was 60%. On nonrebreathing now 94%. History of COPD and lung cancer. BP 96/61 HISTORY OF CANCER, HTN EXAM: PORTABLE CHEST 1 VIEW COMPARISON:  CT and plain film on 06/07/2018, and earlier CT exam 05/08/2018 FINDINGS: Status post median sternotomy. The heart is enlarged. There is atherosclerotic calcification of the thoracic aorta. Stable appearance of RIGHT perihilar opacity. No new edema or consolidation. IMPRESSION: Stable appearance of the chest. Electronically Signed   By: Nolon Nations M.D.   On: 06/17/2018 16:48       Subjective: Patient feels a little better today.  Shortness of breath improving.  Discharge Exam: Vitals:   05/24/2018 0500 06/13/2018 0600 05/24/2018 0838 06/13/2018 0839  BP:   (!) 94/56 92/61  Pulse: 81 83 94 94  Resp: 14 13 (!) 21 (!) 24  Temp:      TempSrc:      SpO2: 98% 99% 100% 98%  Weight:      Height:        General: Pt is alert, awake, not in acute distress Cardiovascular: RRR, S1/S2 +, no rubs, no  gallops Respiratory: Diminished breath sounds bilaterally Abdominal: Soft, NT, ND, bowel sounds + Extremities: 1+ edema, no cyanosis    The results of significant diagnostics from this hospitalization (including imaging, microbiology, ancillary and laboratory) are listed below for reference.     Microbiology: Recent Results (from the past 240 hour(s))  Urine culture     Status: Abnormal (Preliminary result)   Collection Time: 06/17/18  8:10 PM  Result Value Ref Range Status   Specimen Description   Final    URINE, CATHETERIZED Performed at [facility], [address], [place], [place] [address]    Special Requests   Final    NONE Performed at [facility], [address], [place], [place] [address]    Culture >=100,000 COLONIES/mL KLEBSIELLA PNEUMONIAE (A)  Final   Report Status PENDING  Incomplete     Labs: BNP (last 3 results) Recent  Labs    06/07/18 1737 06/14/18 1231 06/17/18 1615  BNP 1,166.0* 977.9* 161.0*   Basic Metabolic Panel: Recent Labs  Lab 06/13/18 0327 06/14/18 0633 06/15/18 0914 06/17/18 1615 06/18/18 0404  NA 129* 128* 128* 120* 124*  K 4.6 4.7 4.2 3.8 3.5  CL 97* 96* 93* 84* 90*  CO2 21* 21* 25 22 21*  GLUCOSE 128* 129* 101* 105* 87  BUN 28* 30* 34* 31* 36*  CREATININE 0.77 0.94 0.97 0.93 0.88  CALCIUM 8.4* 8.2* 8.6* 8.5* 7.9*   Liver Function Tests: Recent Labs  Lab 06/18/18 0404  AST 39  ALT 38  ALKPHOS 74  BILITOT 2.0*  PROT 5.4*  ALBUMIN 3.0*   No results for input(s): LIPASE, AMYLASE in the last 168 hours. No results for input(s): AMMONIA in the last 168 hours. CBC: Recent Labs  Lab 06/13/18 0327 06/14/18 0258 06/15/18 0223 06/17/18 1615 06/18/18 0404  WBC 10.6* 12.9* 12.7* 16.9* 10.8*  NEUTROABS  --   --   --  15.2*  --   HGB 14.6 14.7 13.7 15.1 13.6  HCT 41.4 41.3 39.1 44.1 39.0  MCV 87.7 86.6 86.3 86.6 87.1  PLT 125* 122* 131* 118* 114*   Cardiac Enzymes: Recent Labs  Lab 06/17/18 1615  TROPONINI 0.26*    BNP: Invalid input(s): POCBNP CBG: No results for input(s): GLUCAP in the last 168 hours. D-Dimer No results for input(s): DDIMER in the last 72 hours. Hgb A1c No results for input(s): HGBA1C in the last 72 hours. Lipid Profile No results for input(s): CHOL, HDL, LDLCALC, TRIG, CHOLHDL, LDLDIRECT in the last 72 hours. Thyroid function studies No results for input(s): TSH, T4TOTAL, T3FREE, THYROIDAB in the last 72 hours.  Invalid input(s): FREET3 Anemia work up No results for input(s): VITAMINB12, FOLATE, FERRITIN, TIBC, IRON, RETICCTPCT in the last 72 hours. Urinalysis    Component Value Date/Time   COLORURINE YELLOW 06/17/2018 2010   APPEARANCEUR CLEAR 06/17/2018 2010   LABSPEC 1.009 06/17/2018 2010   PHURINE 6.0 06/17/2018 2010   GLUCOSEU NEGATIVE 06/17/2018 2010   HGBUR SMALL (A) 06/17/2018 2010   [place] NEGATIVE 06/17/2018 2010   KETONESUR 5 (A) 06/17/2018 2010   PROTEINUR NEGATIVE 06/17/2018 2010   UROBILINOGEN 1.0 07/17/2013 1201   NITRITE NEGATIVE 06/17/2018 2010   LEUKOCYTESUR NEGATIVE 06/17/2018 2010   Sepsis Labs Invalid input(s): PROCALCITONIN,  WBC,  LACTICIDVEN Microbiology Recent Results (from the past 240 hour(s))  Urine culture     Status: Abnormal (Preliminary result)   Collection Time: 06/17/18  8:10 PM  Result Value Ref Range Status   Specimen Description   Final    URINE, CATHETERIZED Performed at [facility], [address], [place], [place] [address]    Special Requests   Final    NONE Performed at [facility], [address], [place],  [address]    Culture >=100,000 COLONIES/mL KLEBSIELLA PNEUMONIAE (A)  Final   Report Status PENDING  Incomplete     Time coordinating discharge: 60mins  SIGNED:   Kathie Dike, MD  [facility] 06/14/2018, 8:31 PM Pager   If 7PM-7AM, please contact night-coverage [email or web address] Password [email or web address]

## 2018-06-19 NOTE — Clinical Social Work Note (Signed)
Clinical Social Work Assessment  Patient Details  Name: [name] [name] MRN: [id number] Date of Birth: [birth year]  Date of referral:  06/18/18               Reason for consult:  End of Life/Hospice                Permission sought to share information with:  Family Supports Permission granted to share information::  Yes, Verbal Permission Granted  Name::     Bailey Kolbe  Agency::     Relationship::  spouse  Contact Information:  [phone]  Housing/Transportation Living arrangements for the past 2 months:  Single Family Home Source of Information:  Spouse Patient Interpreter Needed:  None Criminal Activity/Legal Involvement Pertinent to Current Situation/Hospitalization:  No - Comment as needed Significant Relationships:  Significant Other, Adult Children Lives with:  Spouse Do you feel safe going back to the place where you live?  No Need for family participation in patient care:  Yes (Comment)  Care giving concerns:  Wife wants patient to be comfortable, and to be able to transfer to Hospice when bed is available   Facilities manager / plan:  Refer to [facility]  Employment status:  Retired Forensic scientist:  Managed Medicare PT Recommendations:  Not assessed at this time Information / Referral to community resources:  Other (Comment Required)([facility])  Patient/Family's Response to care:  Family is appreciative  Patient/Family's Understanding of and Emotional Response to Diagnosis, Current Treatment, and Prognosis:  Spouse is grieving, patient appears to be engaged, but unable to assess mental status  Emotional Assessment Appearance:  Appears stated age Attitude/Demeanor/Rapport:  Unable to Assess Affect (typically observed):  Unable to Assess Orientation:  Fluctuating Orientation (Suspected and/or reported Sundowners) Alcohol / Substance use:  Not Applicable Psych involvement (Current and /or in the community):  No  (Comment)  Discharge Needs  Concerns to be addressed:  Decision making concerns, Grief and Loss Concerns Readmission within the last 30 days:  No Current discharge risk:  Terminally ill Barriers to Discharge:  Hospice Bed not available   Trish Mage, LCSW 06/09/2018, 9:22 AM

## 2018-06-19 NOTE — H&P (Signed)
History and Physical    [name] [name] TKP:[id number] DOB: 04-17-1941 DOA: 06/02/2018  PCP: [name], MD   I have personally briefly reviewed patient's old medical records in [place]  Chief Complaint: Shortness of breath  HPI: [name] [name] is a 77 y.o. male with history of atrial fibrillation, COPD, chronic systolic congestive heart failure with ejection fraction 35%, lung cancer, stage III.  Please refer to discharge summary done on 12/31 for further details of patient's hospital course.  Family and patient have elected to pursue hospice care and will be transferred to residential hospice.  He is currently awaiting a bed at residential hospice in his GIP status.  Plan is for comfort care  Review of Systems: As per HPI otherwise 10 point review of systems negative.    Past Medical History:  Diagnosis Date  . Adenocarcinoma of right lung, stage 3 ([place]) 09/20/2016  . Atrial fibrillation ([place])    no hx of reported at preop visit of 04/21/11   . CAD (coronary artery disease)   . Carotid artery occlusion    left carotid endarterectomy  . Chronic kidney disease    AAA repair with reimplant of renals   . CHRONIC OBSTRUCTIVE PULMONARY DISEASE    pt denied at visit of 04/21/11   . CORONARY ARTERY DISEASE   . Dehydration 11/15/2016  . Disorder of left sacroiliac joint 08/24/2016  . DIVERTICULOSIS OF COLON   . Encounter for antineoplastic chemotherapy 10/17/2016  . GERD   . H/O hiatal hernia   . Headache(784.0)    Hx: of years of years  . HYPERTENSION   . HYPOTHYROIDISM   . JOINT EFFUSION, KNEE   . KNEE, ARTHRITIS, DEGEN./OSTEO    right knee   . Myocardial infarction ([place])    1994   . NEPHROLITHIASIS, HX OF   . OSTEOPOROSIS   . Other dysphagia   . PERIPHERAL VASCULAR DISEASE    AAA - 1994 with reimplant of renals   . Peripheral vascular disease ([place])    subclavian stenosis PTA - 3/08   . Pure hypercholesterolemia   . Renal artery stenosis [facility])     Past  Surgical History:  Procedure Laterality Date  . ABDOMINAL AORTIC ANEURYSM REPAIR     with reimplantation of renals   . CARDIAC CATHETERIZATION     2008  . CAROTID ENDARTERECTOMY Left Jan. 30, 2015   CEA  . CHOLECYSTECTOMY  2000   Gall Bladder  . COLONOSCOPY W/ BIOPSIES AND POLYPECTOMY     Hx: of  . CORONARY ARTERY BYPASS GRAFT     1995  . CORONARY STENT PLACEMENT     Hx: of  . ENDARTERECTOMY Left 07/19/2013   Procedure: ENDARTERECTOMY CAROTID;  Surgeon: Mal Misty, MD;  Location: [place];  Service: Vascular;  Laterality: Left;  . GALLBLADDER SURGERY  2004   . JOINT REPLACEMENT     partial knee replacement on left 2002   . KNEE SURGERY    . ORIF PELVIC FRACTURE Left 08/25/2016   Procedure: OPEN REDUCTION INTERNAL FIXATION (ORIF) PELVIC FRACTURE; plate on front, SI screw on the back;  Surgeon: Altamese Worthington, MD;  Location: [place];  Service: Orthopedics;  Laterality: Left;  . OTHER SURGICAL HISTORY     left subclavian stenosis surgery PTA 08/2006   . OTHER SURGICAL HISTORY     carotid surgery on right 2004   . RIGHT/LEFT HEART CATH AND CORONARY/GRAFT ANGIOGRAPHY [name] 06/12/2018   Procedure: RIGHT/LEFT HEART CATH AND  CORONARY/GRAFT ANGIOGRAPHY;  Surgeon: Burnell Blanks, MD;  Location: [place] CV [facility];  Service: Cardiovascular;  Laterality: [name];  . TOTAL KNEE ARTHROPLASTY  04/29/2011   Procedure: TOTAL KNEE ARTHROPLASTY;  Surgeon: Gearlean Alf;  Location: [facility];  Service: Orthopedics;  Laterality: Right;  . urinary retention    . VASCULAR SURGERY     AAA  . VASECTOMY  1973  . VIDEO BRONCHOSCOPY WITH ENDOBRONCHIAL ULTRASOUND [name] 09/09/2016   Procedure: VIDEO BRONCHOSCOPY WITH ENDOBRONCHIAL ULTRASOUND;  Surgeon: Grace Isaac, MD;  Location: [facility];  Service: Thoracic;  Laterality: [name];    Social History:  reports that he quit smoking about 26 years ago. His smoking use included cigarettes. He has never used smokeless tobacco. He reports that he does not drink alcohol  or use drugs.  Allergies  Allergen Reactions  . Neurontin [Gabapentin] Other (See Comments)    Caused confusion  . Imitrex [Sumatriptan] Nausea And Vomiting and Other (See Comments)    "made me like I was having a stoke"  . Lipitor [Atorvastatin] Nausea And Vomiting and Other (See Comments)    INTOLERANCE > MYALGIAS "couldn't get out of the bed ( pt had to crawl out of bed ) I hurt so bad"    Family History  Problem Relation Age of Onset  . Hypertension Mother   . Stroke Mother   . Heart disease Father   . Heart attack Father   . Diabetes Sister   . Cancer Brother 69       Lung  . Diabetes Sister      Prior to Admission medications   Medication Sig Start Date End Date Taking? Authorizing Provider  acetaminophen (TYLENOL) 325 MG tablet Take 325-650 mg by mouth daily as needed for headache (for pain).     [provider]  albuterol (PROVENTIL HFA;VENTOLIN HFA) 108 (90 Base) MCG/ACT inhaler Inhale 2 puffs into the lungs every 6 (six) hours as needed for wheezing or shortness of breath. 06/14/18   Rai, Vernelle Emerald, MD  aspirin EC 81 MG EC tablet Take 1 tablet (81 mg total) by mouth daily. 06/14/18   Rai, Ripudeep K, MD  bumetanide (BUMEX) 1 MG tablet Take 1 tablet (1 mg total) by mouth daily. 06/15/18   Rai, Ripudeep K, MD  donepezil (ARICEPT) 10 MG tablet Take 1 tablet daily Patient taking differently: Take 10 mg by mouth at bedtime.  04/03/18   Cameron Sprang, MD  lansoprazole (PREVACID) 15 MG capsule Take 1 capsule (15 mg total) by mouth daily. Patient taking differently: Take 15 mg by mouth every morning.  09/16/16   Angiulli, Lavon Paganini, PA-C  levothyroxine (SYNTHROID, LEVOTHROID) 112 MCG tablet Take 1 tablet (112 mcg total) by mouth daily. Patient taking differently: Take 112 mcg by mouth every morning.  08/17/17   [name], MD  linaclotide [facility]) 145 MCG CAPS capsule Take 1 capsule (145 mcg total) by mouth daily before breakfast. 08/17/17   [name],  MD  lisinopril (PRINIVIL,ZESTRIL) 2.5 MG tablet Take 1 tablet (2.5 mg total) by mouth daily. 06/14/18   Rai, Vernelle Emerald, MD  metoprolol succinate (TOPROL-XL) 25 MG 24 hr tablet Take 0.5 tablets (12.5 mg total) by mouth daily. 06/14/18   Rai, Ripudeep K, MD  mometasone-formoterol (DULERA) 100-5 MCG/ACT AERO Inhale 2 puffs into the lungs 2 (two) times daily. 06/14/18   Rai, Vernelle Emerald, MD  niacin (SLO-NIACIN) 500 MG tablet Take 3 tablets (1,500 mg total) by mouth at bedtime.  09/16/16   Angiulli, Lavon Paganini, PA-C  nitroGLYCERIN (NITROSTAT) 0.4 MG SL tablet Place 1 tablet (0.4 mg total) under the tongue every 5 (five) minutes as needed for chest pain. 09/16/16   Angiulli, Lavon Paganini, PA-C  nystatin (MYCOSTATIN) 100000 UNIT/ML suspension Take 5 mLs (500,000 Units total) by mouth 4 (four) times daily. 06/15/18   Rai, Vernelle Emerald, MD  rosuvastatin (CRESTOR) 20 MG tablet Take 1 tablet (20 mg total) by mouth daily. Patient taking differently: Take 20 mg by mouth at bedtime.  01/05/18   [name], MD  tamsulosin (FLOMAX) 0.4 MG CAPS capsule TAKE ONE CAPSULE BY MOUTH TWICE A DAY. Patient taking differently: Take 0.4 mg by mouth daily.  09/07/17   [name], MD  warfarin (COUMADIN) 5 MG tablet TAKE ONE TABLET ON MONDAY WEDNESDAY AND SATURDAY, TAKE ONE AND ONE-HALF ON SUNDAY TUESDAY [place] Patient taking differently: Take 5 mg by mouth See admin instructions.  04/19/18   [name], MD    Physical Exam: Vitals:   06/07/2018 2008  Temp: (!) 97.5 F (36.4 C)  TempSrc: Axillary    Constitutional: NAD, calm, comfortable Eyes: PERRL, lids and conjunctivae normal ENMT: Mucous membranes are moist. Posterior pharynx clear of any exudate or lesions.Normal dentition.  Neck: normal, supple, no masses, no thyromegaly Respiratory: Diminished breath sounds bilaterally.  Normal respiratory effort. No accessory muscle use.  Cardiovascular: Regular rate and rhythm, no murmurs / rubs / gallops. No extremity  edema. 2+ pedal pulses. No carotid bruits.  Abdomen: no tenderness, no masses palpated. No hepatosplenomegaly. Bowel sounds positive.    Labs on Admission: I have personally reviewed following labs and imaging studies  CBC: Recent Labs  Lab 06/13/18 0327 06/14/18 0258 06/15/18 0223 06/17/18 1615 06/18/18 0404  WBC 10.6* 12.9* 12.7* 16.9* 10.8*  NEUTROABS  --   --   --  15.2*  --   HGB 14.6 14.7 13.7 15.1 13.6  HCT 41.4 41.3 39.1 44.1 39.0  MCV 87.7 86.6 86.3 86.6 87.1  PLT 125* 122* 131* 118* 614*   Basic Metabolic Panel: Recent Labs  Lab 06/13/18 0327 06/14/18 0633 06/15/18 0914 06/17/18 1615 06/18/18 0404  NA 129* 128* 128* 120* 124*  K 4.6 4.7 4.2 3.8 3.5  CL 97* 96* 93* 84* 90*  CO2 21* 21* 25 22 21*  GLUCOSE 128* 129* 101* 105* 87  BUN 28* 30* 34* 31* 36*  CREATININE 0.77 0.94 0.97 0.93 0.88  CALCIUM 8.4* 8.2* 8.6* 8.5* 7.9*   GFR: Estimated Creatinine Clearance: 53.7 mL/min (by C-G formula based on SCr of 0.88 mg/dL). Liver Function Tests: Recent Labs  Lab 06/18/18 0404  AST 39  ALT 38  ALKPHOS 74  BILITOT 2.0*  PROT 5.4*  ALBUMIN 3.0*   No results for input(s): LIPASE, AMYLASE in the last 168 hours. No results for input(s): AMMONIA in the last 168 hours. Coagulation Profile: Recent Labs  Lab 06/13/18 0804 06/14/18 0633 06/15/18 0223 06/17/18 1615 06/18/18 0931  INR 1.62 1.52 1.60 2.14 2.99   Cardiac Enzymes: Recent Labs  Lab 06/17/18 1615  TROPONINI 0.26*   BNP (last 3 results) No results for input(s): PROBNP in the last 8760 hours. HbA1C: No results for input(s): HGBA1C in the last 72 hours. CBG: No results for input(s): GLUCAP in the last 168 hours. Lipid Profile: No results for input(s): CHOL, HDL, LDLCALC, TRIG, CHOLHDL, LDLDIRECT in the last 72 hours. Thyroid Function Tests: No results for input(s): TSH, T4TOTAL, FREET4, T3FREE, THYROIDAB in the  last 72 hours. Anemia Panel: No results for input(s): VITAMINB12, FOLATE,  FERRITIN, TIBC, IRON, RETICCTPCT in the last 72 hours. Urine analysis:    Component Value Date/Time   COLORURINE YELLOW 06/17/2018 2010   APPEARANCEUR CLEAR 06/17/2018 2010   LABSPEC 1.009 06/17/2018 2010   PHURINE 6.0 06/17/2018 2010   GLUCOSEU NEGATIVE 06/17/2018 2010   HGBUR SMALL (A) 06/17/2018 2010   [place] NEGATIVE 06/17/2018 2010   KETONESUR 5 (A) 06/17/2018 2010   PROTEINUR NEGATIVE 06/17/2018 2010   UROBILINOGEN 1.0 07/17/2013 1201   NITRITE NEGATIVE 06/17/2018 2010   LEUKOCYTESUR NEGATIVE 06/17/2018 2010    Radiological Exams on Admission: No results found.   Assessment/Plan Active Problems:   Hypothyroidism   ATRIAL FIBRILLATION   AAA (abdominal aortic aneurysm) (HCC)   Coronary artery disease involving coronary bypass graft of native heart without angina pectoris   Hyponatremia   T1b N2 M0 disease (stage IIIA).adenocarcinoma of the right middle lobe of lung (HCC)   Dementia, neurological ([place])   Acute respiratory failure with hypoxia (HCC)   COPD exacerbation ([place])     Patient admitted to the hospital under GIP status.  He has multiple medical problems and has had a progressive decline with repeated hospitalizations.  Family/patient have elected to pursue hospice care and awaiting a bed at residential hospice.  Kathie Dike MD [facility] Pager [phone]  If 7PM-7AM, please contact night-coverage [email or web address] Password [email or web address]  06/01/2018, 8:37 PM

## 2018-06-20 LAB — URINE CULTURE: Culture: >=100000 — AB

## 2018-06-20 DEATH — deceased

## 2018-06-21 ENCOUNTER — Ambulatory Visit: Payer: Medicare Other | Admitting: Family Medicine

## 2018-07-06 ENCOUNTER — Ambulatory Visit (HOSPITAL_COMMUNITY): Payer: Medicare Other

## 2018-07-06 ENCOUNTER — Other Ambulatory Visit: Payer: Medicare Other

## 2018-07-12 ENCOUNTER — Ambulatory Visit: Payer: Medicare Other | Admitting: Internal Medicine

## 2018-07-21 NOTE — Discharge Summary (Addendum)
Death Summary  [name] [name] QIH:[id number] DOB: 08-29-1940 DOA: Jul 02, 2018  PCP: [name], MD  Admit date: 07/02/18 Date of Death: 07-03-2018 Time of Death: 01:39am  History of present illness:  [name] [name] is a 78 y.o. male with a history of atrial fibrillation, COPD, chronic systolic congestive heart failure with ejection fraction of 35%, lung cancer stage III.  Please refer to discharge summary done on 07-03-2023 for further details of the patient's chronic medical course.  Family and patient had decided to transition the patient to hospice/comfort care and he was discharged/readmitted to the hospital under GIP status/hospice care.  This was done on 07-03-23 with plans for comfort care.  On 1/1 at approximately 1:30 AM, patient was found to be without heart sounds or respirations.  He was pronounced dead at that time.  Patient's wife was at the bedside and was provided support.   Final Diagnoses:  Active Problems:   Hypothyroidism   ATRIAL FIBRILLATION, paroxysmal   AAA (abdominal aortic aneurysm) (HCC)   Coronary artery disease involving coronary bypass graft of native heart without angina pectoris   Hyponatremia   T1b N2 M0 disease (stage IIIA).adenocarcinoma of the right middle lobe of lung (HCC)   Dementia, neurological ([place])   Acute respiratory failure with hypoxia ([place])   COPD exacerbation ([place])     The results of significant diagnostics from this hospitalization (including imaging, microbiology, ancillary and laboratory) are listed below for reference.    Significant Diagnostic Studies: Dg Chest 2 View  Result Date: 06/07/2018 CLINICAL DATA:  Hypoxia.  Shortness of breath. EXAM: CHEST - 2 VIEW COMPARISON:  CT scan May 08, 2017 FINDINGS: Opacity in the right perihilar region is consistent with post therapeutic/radiation changes and is similar since November 2019. No pneumothorax. The cardiomediastinal silhouette is stable. No new nodules or masses. Mild  scarring in the left base. No new suspicious infiltrate. IMPRESSION: Post therapeutic changes on the right.  No acute interval changes. Electronically Signed   By: Dorise Bullion III M.D   On: 06/07/2018 17:28   Ct Angio Chest Pe W/cm &/or Wo Cm  Result Date: 06/07/2018 CLINICAL DATA:  Hypoxia. Shortness of breath for the past week. Right lung cancer. EXAM: CT ANGIOGRAPHY CHEST WITH CONTRAST TECHNIQUE: Multidetector CT imaging of the chest was performed using the standard protocol during bolus administration of intravenous contrast. Multiplanar CT image reconstructions and MIPs were obtained to evaluate the vascular anatomy. CONTRAST:  150mL ISOVUE-370 IOPAMIDOL (ISOVUE-370) INJECTION 76% COMPARISON:  CT chest dated May 08, 2018. FINDINGS: Cardiovascular: Satisfactory opacification of the pulmonary arteries to the segmental level. No evidence of pulmonary embolism. Stable cardiomegaly. No pericardial effusion. Prior CABG. No thoracic aortic aneurysm. Evaluation for aortic dissection is limited due to phase of contrast. Coronary, aortic arch, and branch vessel atherosclerotic vascular disease. 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 Mediastinum/Nodes: Stable right low jugular and paratracheal lymphadenopathy measuring up to 16 mm. Unchanged 9 mm subcarinal lymph node. Unchanged 10 mm left hilar lymph nodes. No axillary lymphadenopathy. The thyroid gland, trachea, and esophagus demonstrate no significant findings. Lungs/Pleura: Moderate centrilobular emphysema again noted. Unchanged 5 mm pulmonary nodule in the right upper lobe. Previously seen 5 mm nodule in the inferior right upper lobe is no longer identified. Unchanged geographic consolidation and traction bronchiectasis centered about the medial and perihilar right lung. Unchanged chronic right pleural thickening. No pneumothorax, pleural effusion or new airspace  consolidation.  Upper Abdomen: No acute abnormality. Musculoskeletal: No chest wall abnormality. No acute or significant osseous findings. Chronic T6 and T8 mild compression deformities are unchanged. Review of the MIP images confirms the above findings. IMPRESSION: 1. No evidence of pulmonary embolism. No acute intrathoracic process. 2. Unchanged right perihilar radiation fibrosis. 3. Stable lower cervical and right paratracheal lymphadenopathy. 4. Previously seen new 5 mm pulmonary nodule in the right upper lobe is no longer identified and was likely inflammatory or infectious. 5. 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 6.  Emphysema (ICD10-J43.9). 7.  Aortic atherosclerosis (ICD10-I70.0). Electronically Signed   By: Titus Dubin M.D.   On: 06/07/2018 21:32   Dg Chest Port 1 View  Result Date: 06/17/2018 CLINICAL DATA:  SOB, PER ER NOTE, Pt called out due to SOB. When EMS arrived, O2 sat was 81% on 6L Home oxygen. RA was 60%. On nonrebreathing now 94%. History of COPD and lung cancer. BP 96/61 HISTORY OF CANCER, HTN EXAM: PORTABLE CHEST 1 VIEW COMPARISON:  CT and plain film on 06/07/2018, and earlier CT exam 05/08/2018 FINDINGS: Status post median sternotomy. The heart is enlarged. There is atherosclerotic calcification of the thoracic aorta. Stable appearance of RIGHT perihilar opacity. No new edema or consolidation. IMPRESSION: Stable appearance of the chest. Electronically Signed   By: Nolon Nations M.D.   On: 06/17/2018 16:48    Microbiology: Recent Results (from the past 240 hour(s))  Urine culture     Status: Abnormal   Collection Time: 06/17/18  8:10 PM  Result Value Ref Range Status   Specimen Description   Final    URINE, CATHETERIZED Performed at [facility], [address], [place], [place] [address]    Special Requests   Final    NONE Performed at [facility], [address] [place], [place]  [address]    Culture >=100,000 COLONIES/mL KLEBSIELLA PNEUMONIAE (A)  Final   Report Status 07-05-2018 FINAL  Final   Organism ID, Bacteria KLEBSIELLA PNEUMONIAE (A)  Final      Susceptibility   Klebsiella pneumoniae - MIC*    AMPICILLIN >=32 RESISTANT Resistant     CEFAZOLIN <=4 SENSITIVE Sensitive     CEFTRIAXONE <=1 SENSITIVE Sensitive     CIPROFLOXACIN 0.5 SENSITIVE Sensitive     GENTAMICIN <=1 SENSITIVE Sensitive     IMIPENEM <=0.25 SENSITIVE Sensitive     NITROFURANTOIN 128 RESISTANT Resistant     TRIMETH/SULFA <=20 SENSITIVE Sensitive     AMPICILLIN/SULBACTAM 16 INTERMEDIATE Intermediate     PIP/TAZO 16 SENSITIVE Sensitive     Extended ESBL NEGATIVE Sensitive     * >=100,000 COLONIES/mL KLEBSIELLA PNEUMONIAE     Labs: Basic Metabolic Panel: No results for input(s): NA, K, CL, CO2, GLUCOSE, BUN, CREATININE, CALCIUM, MG, PHOS in the last 168 hours. Liver Function Tests: No results for input(s): AST, ALT, ALKPHOS, BILITOT, PROT, ALBUMIN in the last 168 hours. No results for input(s): LIPASE, AMYLASE in the last 168 hours. No results for input(s): AMMONIA in the last 168 hours. CBC: No results for input(s): WBC, NEUTROABS, HGB, HCT, MCV, PLT in the last 168 hours. Cardiac Enzymes: No results for input(s): CKTOTAL, CKMB, CKMBINDEX, TROPONINI in the last 168 hours. D-Dimer No results for input(s): DDIMER in the last 72 hours. BNP: Invalid input(s): POCBNP CBG: No results for input(s): GLUCAP in the last 168 hours. Anemia work up No results for input(s): VITAMINB12, FOLATE, FERRITIN, TIBC, IRON, RETICCTPCT in  the last 72 hours. Urinalysis    Component Value Date/Time   COLORURINE YELLOW 06/17/2018 2010   APPEARANCEUR CLEAR 06/17/2018 2010   LABSPEC 1.009 06/17/2018 2010   PHURINE 6.0 06/17/2018 2010   GLUCOSEU NEGATIVE 06/17/2018 2010   HGBUR SMALL (A) 06/17/2018 2010   [place] NEGATIVE 06/17/2018 2010   KETONESUR 5 (A) 06/17/2018 2010   PROTEINUR NEGATIVE  06/17/2018 2010   UROBILINOGEN 1.0 07/17/2013 1201   NITRITE NEGATIVE 06/17/2018 2010   LEUKOCYTESUR NEGATIVE 06/17/2018 2010   Sepsis Labs Invalid input(s): PROCALCITONIN,  WBC,  LACTICIDVEN     SIGNED:  Kathie Dike, MD  [facility] 06/25/2018, 8:19 PM Pager   If 7PM-7AM, please contact night-coverage [email or web address] Password [email or web address]

## 2018-07-21 NOTE — Progress Notes (Signed)
RN in room to discuss change in HR with patient's wife. HR went from 80 to 60 then to 40s. Pts wife notified to call her children to come in. Pt HR then dropped to 20s, and asystole. TIme of death 0139. Verified by two RNs. Wife at bedside. No heart or lung sounds auscultated. [place] [facility] notified. Ref number [id number] given by Henri Medal.

## 2018-07-21 DEATH — deceased

## 2018-09-19 ENCOUNTER — Ambulatory Visit: Payer: Medicare Other | Admitting: Family

## 2018-09-19 ENCOUNTER — Encounter (HOSPITAL_COMMUNITY): Payer: Medicare Other

## 2018-09-21 ENCOUNTER — Ambulatory Visit: Payer: Medicare Other | Admitting: Family

## 2018-09-21 ENCOUNTER — Encounter (HOSPITAL_COMMUNITY): Payer: Medicare Other

## 2018-11-23 ENCOUNTER — Ambulatory Visit: Payer: Medicare Other | Admitting: Neurology

## 2019-02-10 IMAGING — CT CT HEAD W/O CM
4 of 7 series · 15 of 47 positions shown, 16 images · non-contrast
Comparison: CT chest abdomen and pelvis from today reported
separately. [HOSPITAL] Brain MRI 07/13/2004.

CLINICAL DATA: 76-year-old male status post fall from ladder.
Extensive pelvic fractures. Abnormal chest CT today revealing a
spiculated right middle lobe lung mass. Initial encounter.

EXAM:
CT HEAD WITHOUT CONTRAST
CT CERVICAL SPINE WITHOUT CONTRAST
TECHNIQUE: Multidetector CT imaging of the head and cervical spine was
performed following the standard protocol without intravenous
contrast. Multiplanar CT image reconstructions of the cervical spine
were also generated.

[Series 4: head without · axial · non-contrast · 0.49mm/px · z∈[-45,+40]mm · 3 of 35 slices shown, 4 images]
[im 9/35  brain]
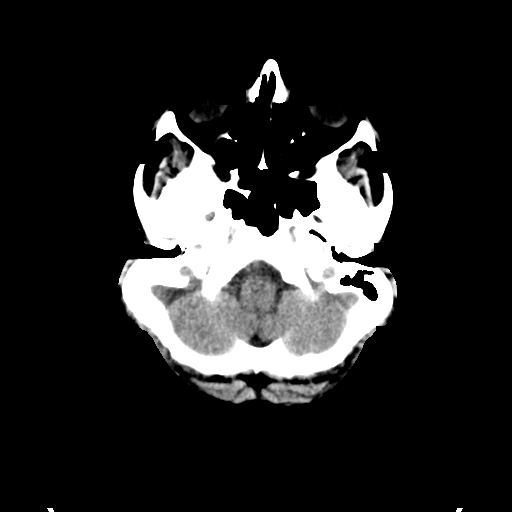
[im 9/35  bone]
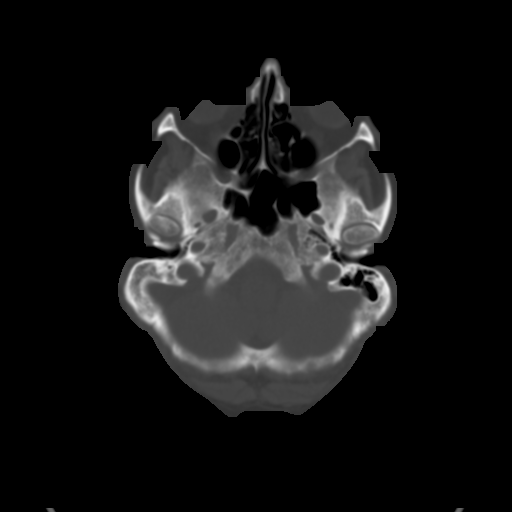
[im 18/35  brain]
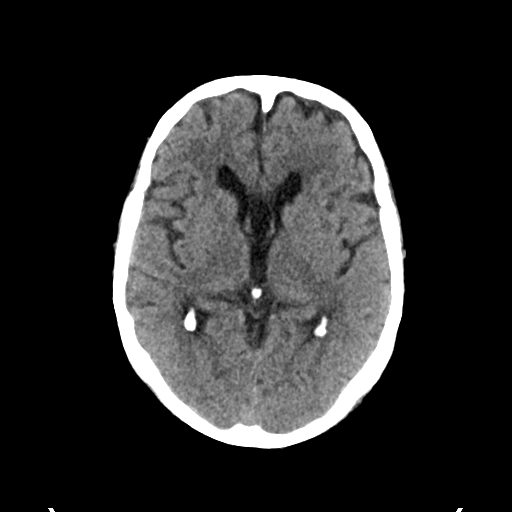
[im 26/35  brain]
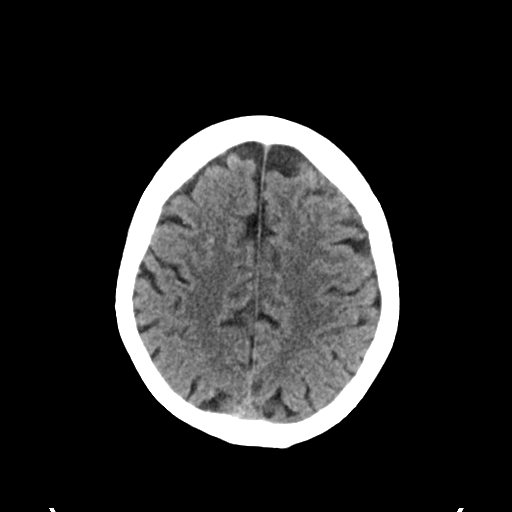

[Series 6: head bone · axial · 0.49mm/px · z∈[-71,+71]mm · 8 of 87 slices shown]
[im 8/87  bone]
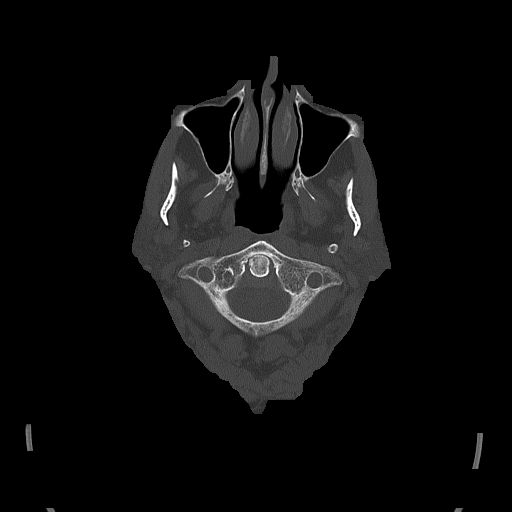
[im 16/87  bone]
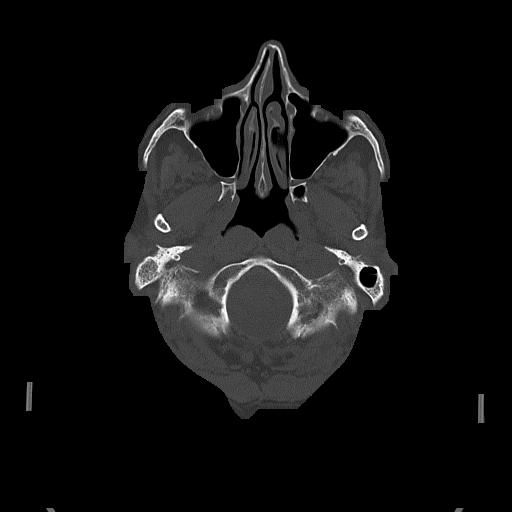
[im 32/87  bone]
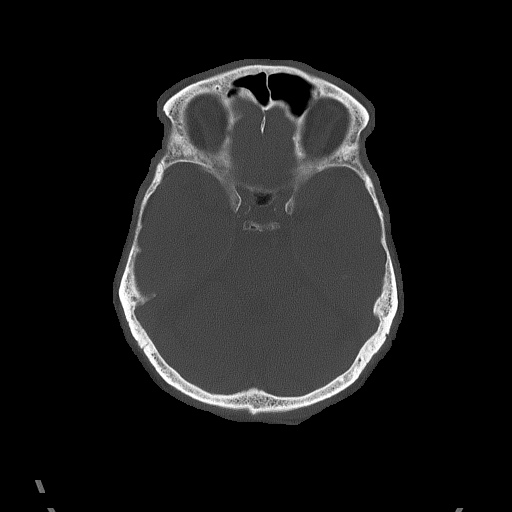
[im 40/87  bone]
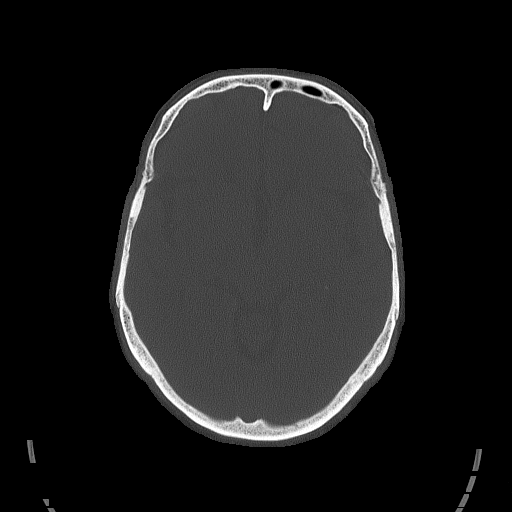
[im 47/87  bone]
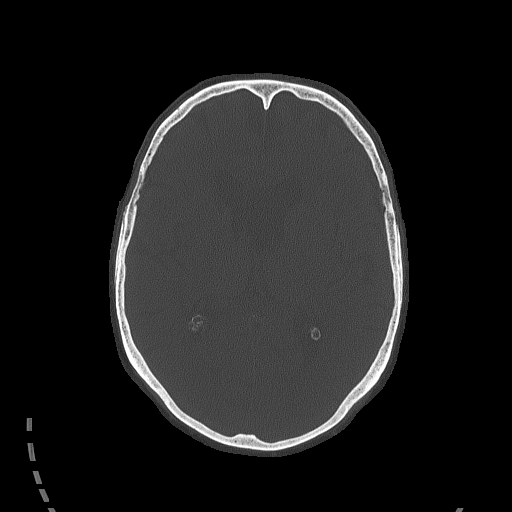
[im 55/87  bone]
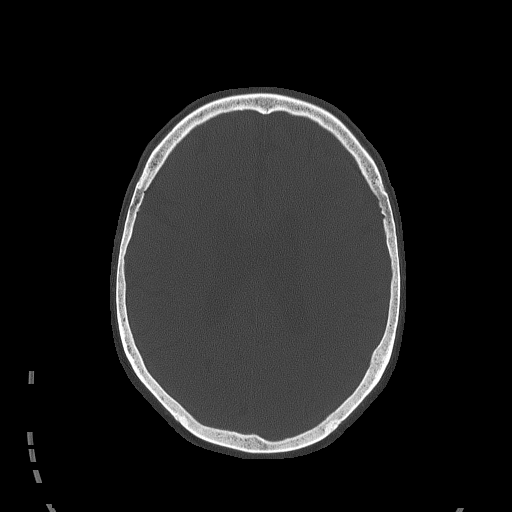
[im 71/87  bone]
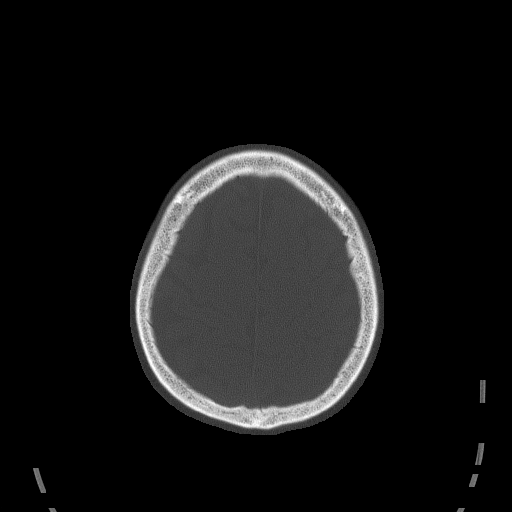
[im 79/87  bone]
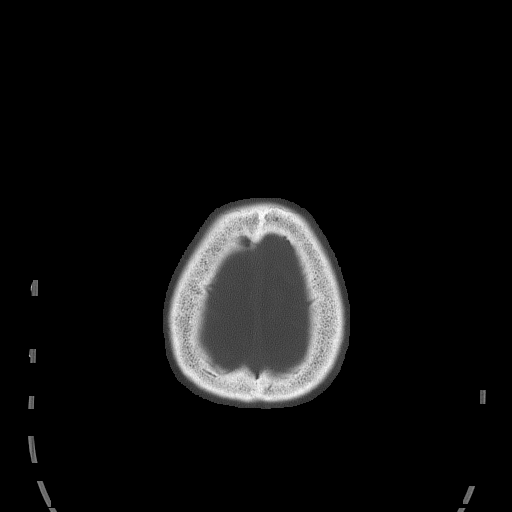

[Series 7: head without cor · coronal · non-contrast · 0.35mm/px · 3 of 70 slices shown]
[im 24/70  brain]
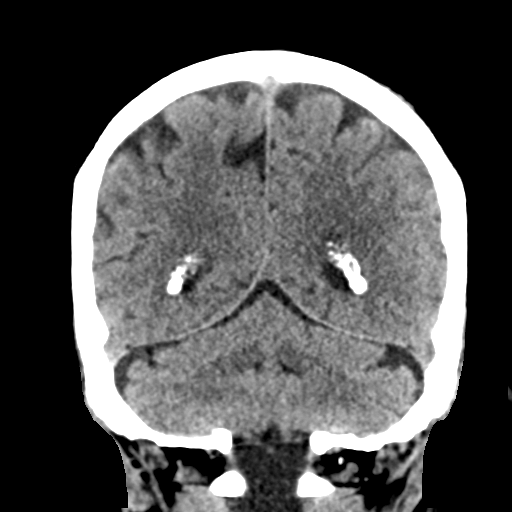
[im 35/70  brain]
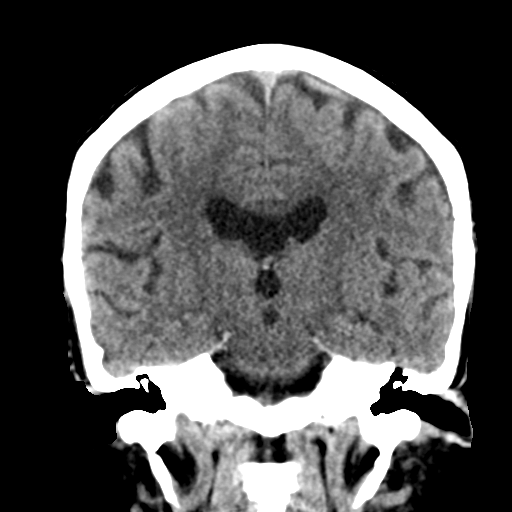
[im 47/70  brain]
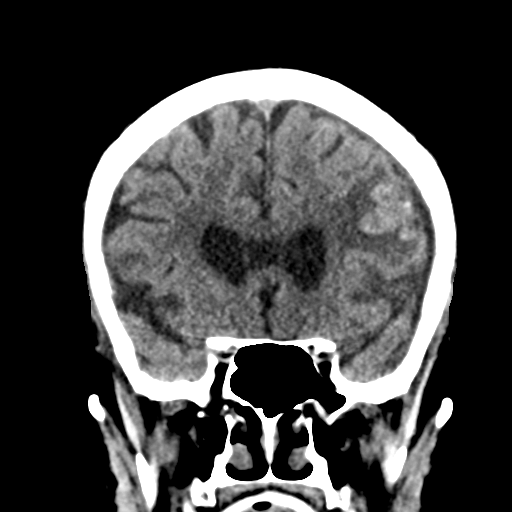

[Series 8: head without sag · sagittal · non-contrast · 0.35mm/px · 1 of 53 slices shown]
[im 27/53  brain]
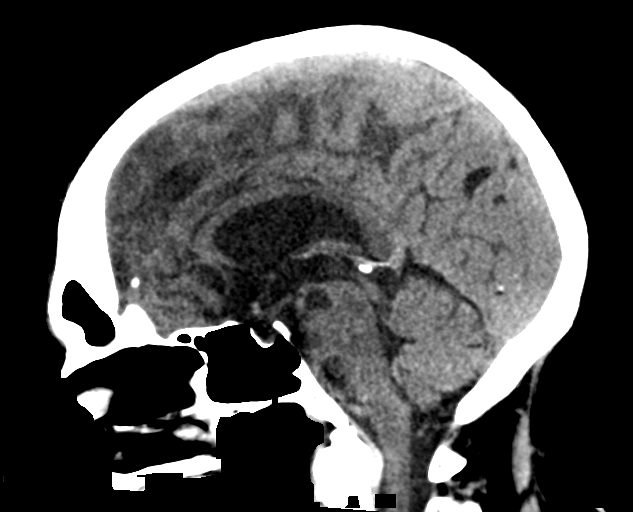

[15 of 47 positions shown; findings below may reference images not displayed]

FINDINGS: CT HEAD FINDINGS

Brain: Rounded mixed hyper and hypodense somewhat masslike area in
the anterior left frontal lobe estimated at 2 cm. Adjacent increased
white matter hypodensity in a configuration suggestive of mild
vasogenic edema. See series 8, image 43. No significant regional
mass effect. No second similar lesion identified elsewhere although
there is a punctate perhaps dystrophic calcification in the right
frontal lobe at the gray-white matter junction (series 4, image 26).

No superimposed intracranial hemorrhage or mass effect. Basilar
cisterns are patent. Cavum septum pellucidum variant. No
ventriculomegaly. Hypodensity in the right caudate nucleus
compatible with chronic small vessel disease. No acute cortically
based infarct identified.

Vascular: Calcified atherosclerosis at the skull base. No suspicious
intracranial vascular hyperdensity.

Skull: No skull fracture identified. Bone mineralization appears
within normal limits for age.

Sinuses/Orbits: Mild to moderate anterior ethmoid and
frontoethmoidal recess mucosal thickening. Other paranasal sinuses
are clear. The left tympanic cavity and mastoids are clear. There is
sclerosis of the right mastoids. There is asymmetric thickening of
the right tympanic membrane.

Other: Visualized orbit soft tissues are within normal limits. Mild
left posterosuperior convexity scalp contusion or hematoma (series
6, image 76) with intact underlying calvarium. No other scout soft
tissue injury identified.

CT CERVICAL SPINE FINDINGS

Alignment: Preserved cervical lordosis. Cervicothoracic junction
alignment is within normal limits. There is trace anterolisthesis of
C3 on C4 and C4 on C5 with associated chronic facet arthropathy.
Bilateral posterior element alignment is within normal limits.

Skull base and vertebrae: Visualized skull base is intact. No
atlanto-occipital dissociation. No cervical spine fracture
identified.

There is heterogeneous bone mineralization in the cervical spine,
including small lucent lesions in the right C4 (coronal image 31)
and left C6 (coronal image 36) levels.

Soft tissues and spinal canal: No prevertebral fluid or swelling. No
visible canal hematoma.

Ligamentous hypertrophy about the odontoid. Calcified carotid and
vertebral artery atherosclerosis. Evidence of previous bilateral
carotid endarterectomy. Extensive calcified proximal great vessel
atherosclerosis at the visible thoracic inlet.

Disc levels: Moderate to severe chronic facet degeneration greater
on the right from the C3 to the C6 level. Disc bulging. Multilevel
ligament flavum hypertrophy suspected. Up to mild degenerative
cervical spinal stenosis at C3-C4.

Upper chest: Visible upper thoracic levels appear intact. Chest
findings today are reported separately.
IMPRESSION: 1. Abnormal anterior left frontal lobe, but the appearance does not
resemble typical traumatic injury to the brain and is instead
suspicious in this clinical setting for a 2 cm brain mass with mild
surrounding vasogenic edema. Brain MRI without and with contrast
when possible would characterize further.
2. No other acute intracranial abnormality is evident. Mild chronic
small vessel disease in the right basal ganglia.
3.  No acute fracture or listhesis identified in the cervical spine.
4. Heterogeneous bone mineralization in the cervical spine could be
degenerative in nature but early osseous metastatic disease is
difficult to exclude.
The head CT findings were reviewed in person with Dr. Ragiq Kuttan on

## 2019-03-24 IMAGING — DX DG CHEST 2V
2 series · 2 of 2 positions shown · non-contrast
Comparison: 09/08/2016, 09/29/2016

CLINICAL DATA: Fever, hypertension, former smoker, atherosclerosis,
known right middle lobe 2 cm PET positive nodule.

EXAM:
CHEST  2 VIEW

[chest lat]
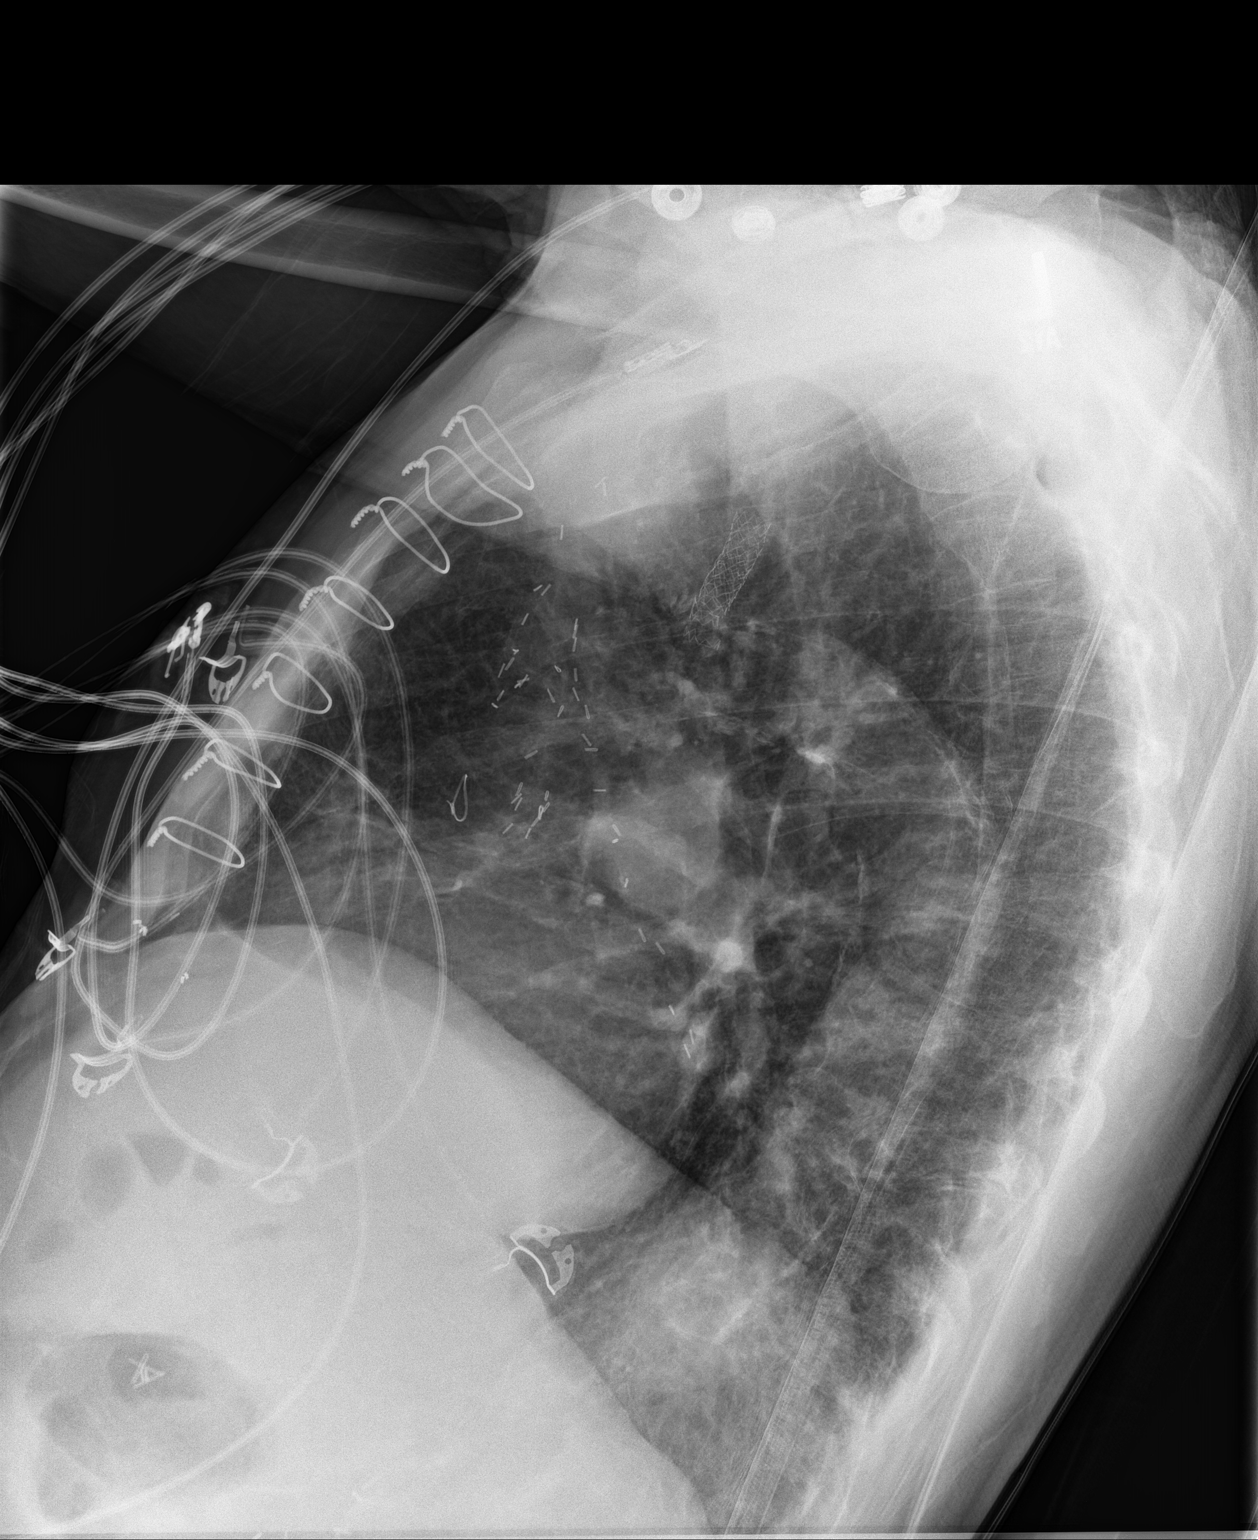

[chest ap]
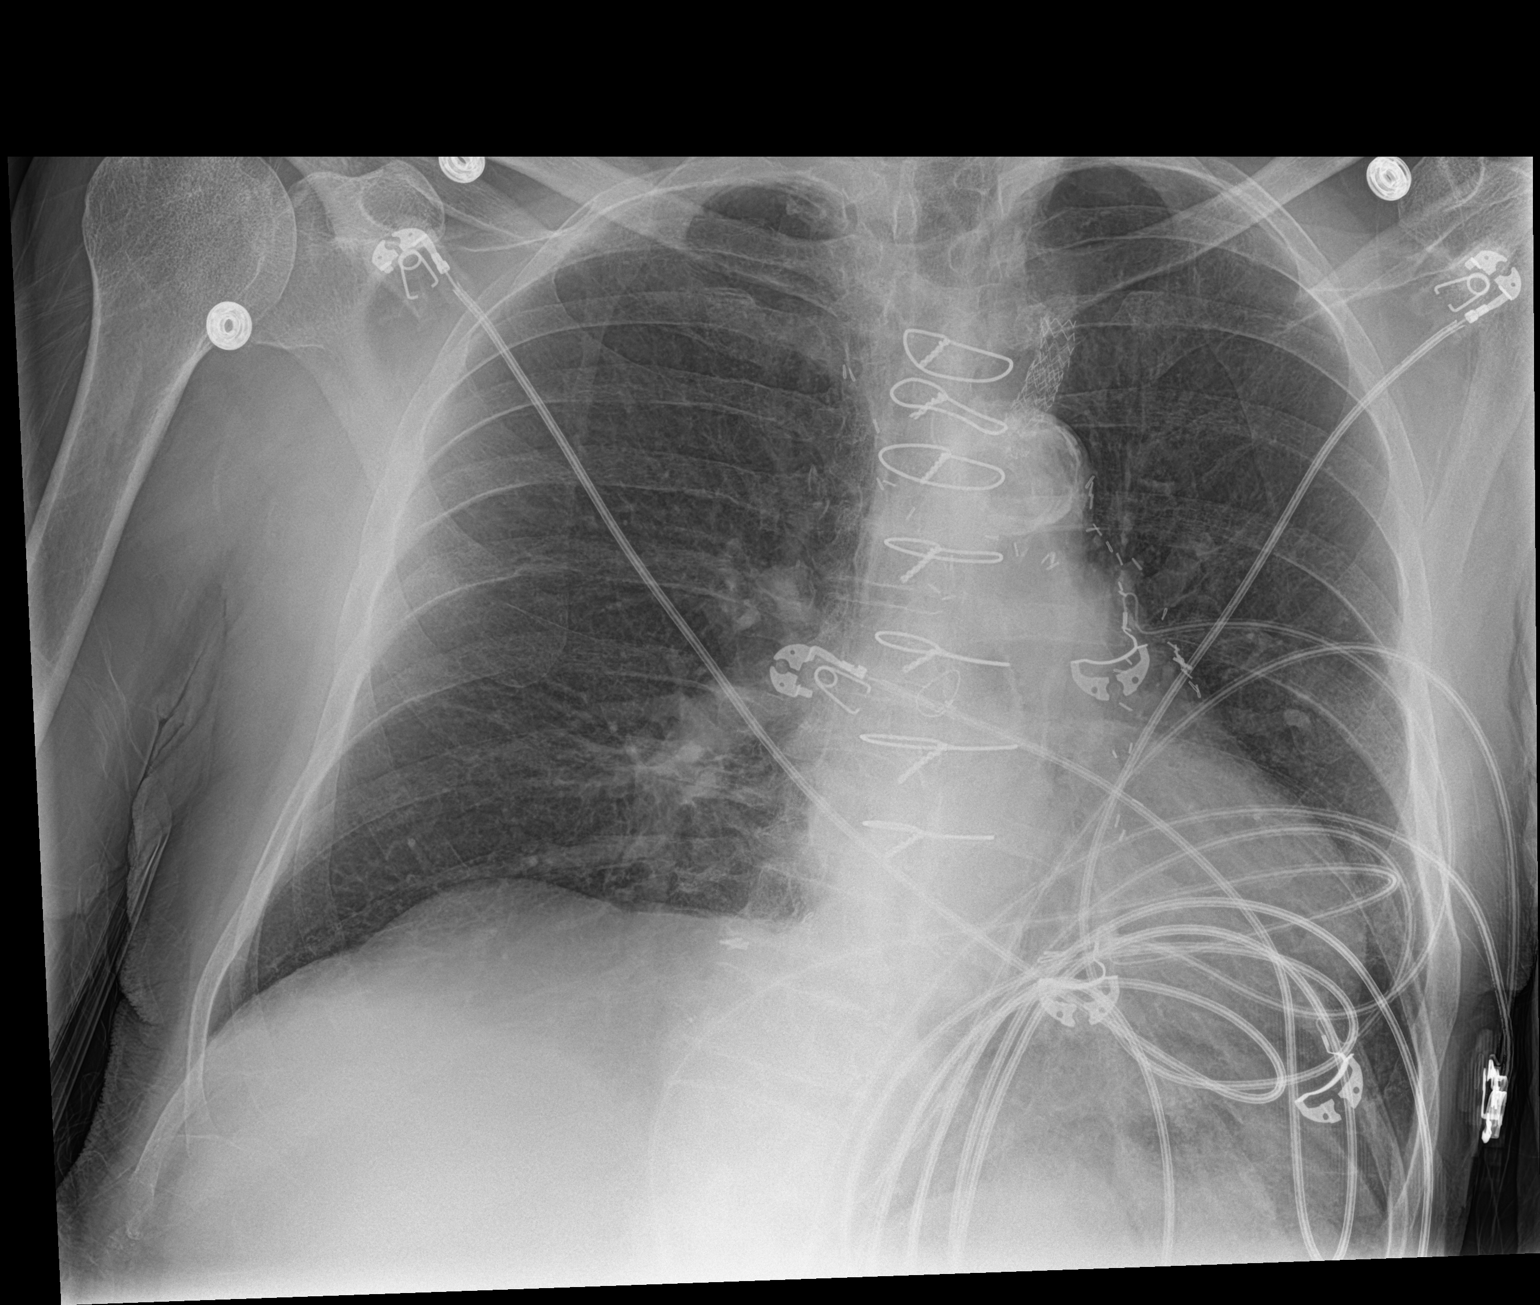

[2 of 2 positions shown; findings below may reference images not displayed]

FINDINGS: Previous coronary bypass changes and aortic arch vascular stent.
Stable mild cardiac enlargement with normal vascularity. Anterior
right middle lobe 2 cm spiculated nodule overlies the right
infrahilar vasculature and is obscured by plain radiography. Right
hilar and subcarinal adenopathy by PET-CT not confidently
demonstrated by plain radiography. No superimposed significant
pneumonia, collapse, consolidation, or edema. No effusion or
pneumothorax. Trachea is midline. Atherosclerosis noted of the
aorta. Degenerative changes of the spine.
IMPRESSION: No superimposed acute chest process.

Cardiomegaly without CHF

Thoracic aortic atherosclerosis

Anterior 2 cm right middle lobe nodule, right hilar and subcarinal
adenopathy demonstrated by PET-CT not well appreciated by plain
radiography. Please refer to the PET-CT of 4 days ago.
